# Patient Record
Sex: Female | Born: 1970
Health system: Southern US, Community
[De-identification: ages and names within clinical notes are randomized; demographics above are authoritative.]

## PROBLEM LIST (undated history)

## (undated) DIAGNOSIS — K219 Gastro-esophageal reflux disease without esophagitis: Secondary | ICD-10-CM

## (undated) DIAGNOSIS — Z5189 Encounter for other specified aftercare: Secondary | ICD-10-CM

## (undated) DIAGNOSIS — I219 Acute myocardial infarction, unspecified: Secondary | ICD-10-CM

## (undated) DIAGNOSIS — G459 Transient cerebral ischemic attack, unspecified: Secondary | ICD-10-CM

## (undated) DIAGNOSIS — D6859 Other primary thrombophilia: Secondary | ICD-10-CM

## (undated) DIAGNOSIS — I639 Cerebral infarction, unspecified: Secondary | ICD-10-CM

## (undated) DIAGNOSIS — E785 Hyperlipidemia, unspecified: Secondary | ICD-10-CM

## (undated) DIAGNOSIS — F419 Anxiety disorder, unspecified: Secondary | ICD-10-CM

## (undated) DIAGNOSIS — Z720 Tobacco use: Secondary | ICD-10-CM

## (undated) DIAGNOSIS — IMO0001 Reserved for inherently not codable concepts without codable children: Secondary | ICD-10-CM

## (undated) DIAGNOSIS — Z91148 Patient's other noncompliance with medication regimen for other reason: Secondary | ICD-10-CM

## (undated) DIAGNOSIS — I10 Essential (primary) hypertension: Secondary | ICD-10-CM

## (undated) DIAGNOSIS — Z9114 Patient's other noncompliance with medication regimen: Secondary | ICD-10-CM

## (undated) DIAGNOSIS — I2510 Atherosclerotic heart disease of native coronary artery without angina pectoris: Secondary | ICD-10-CM

## (undated) DIAGNOSIS — K819 Cholecystitis, unspecified: Secondary | ICD-10-CM

## (undated) DIAGNOSIS — D649 Anemia, unspecified: Secondary | ICD-10-CM

## (undated) DIAGNOSIS — I513 Intracardiac thrombosis, not elsewhere classified: Secondary | ICD-10-CM

## (undated) DIAGNOSIS — K859 Acute pancreatitis without necrosis or infection, unspecified: Secondary | ICD-10-CM

## (undated) HISTORY — PX: CARDIAC CATHETERIZATION: SHX172

## (undated) HISTORY — DX: Cerebral infarction, unspecified: I63.9

## (undated) HISTORY — PX: TUBAL LIGATION: SHX77

## (undated) HISTORY — PX: CORONARY ANGIOPLASTY: SHX604

---

## 1998-06-06 ENCOUNTER — Other Ambulatory Visit: Payer: Self-pay

## 1998-06-06 ENCOUNTER — Encounter: Payer: Self-pay | Admitting: Emergency Medicine

## 1998-06-06 ENCOUNTER — Inpatient Hospital Stay (HOSPITAL_COMMUNITY): Admission: EM | Admit: 1998-06-06 | Discharge: 1998-06-13 | Payer: Self-pay | Admitting: Emergency Medicine

## 1998-06-06 ENCOUNTER — Other Ambulatory Visit (HOSPITAL_COMMUNITY): Payer: Self-pay

## 1998-06-09 ENCOUNTER — Encounter: Payer: Self-pay | Admitting: *Deleted

## 1998-06-10 ENCOUNTER — Encounter: Payer: Self-pay | Admitting: *Deleted

## 1998-07-03 ENCOUNTER — Encounter (HOSPITAL_COMMUNITY): Admission: RE | Admit: 1998-07-03 | Discharge: 1998-10-01 | Payer: Self-pay | Admitting: *Deleted

## 1998-07-12 ENCOUNTER — Inpatient Hospital Stay (HOSPITAL_COMMUNITY): Admission: AD | Admit: 1998-07-12 | Discharge: 1998-07-16 | Payer: Self-pay | Admitting: *Deleted

## 1998-07-15 ENCOUNTER — Encounter: Payer: Self-pay | Admitting: *Deleted

## 1998-08-15 ENCOUNTER — Inpatient Hospital Stay (HOSPITAL_COMMUNITY): Admission: EM | Admit: 1998-08-15 | Discharge: 1998-08-17 | Payer: Self-pay | Admitting: Emergency Medicine

## 1998-10-09 ENCOUNTER — Inpatient Hospital Stay (HOSPITAL_COMMUNITY): Admission: EM | Admit: 1998-10-09 | Discharge: 1998-10-12 | Payer: Self-pay | Admitting: Emergency Medicine

## 1998-10-09 ENCOUNTER — Encounter: Payer: Self-pay | Admitting: Emergency Medicine

## 1999-12-09 ENCOUNTER — Ambulatory Visit (HOSPITAL_COMMUNITY): Admission: RE | Admit: 1999-12-09 | Discharge: 1999-12-09 | Payer: Self-pay | Admitting: Family Medicine

## 1999-12-09 ENCOUNTER — Encounter: Payer: Self-pay | Admitting: Family Medicine

## 1999-12-09 ENCOUNTER — Inpatient Hospital Stay (HOSPITAL_COMMUNITY): Admission: AD | Admit: 1999-12-09 | Discharge: 1999-12-09 | Payer: Self-pay | Admitting: Obstetrics and Gynecology

## 1999-12-12 ENCOUNTER — Inpatient Hospital Stay (HOSPITAL_COMMUNITY): Admission: AD | Admit: 1999-12-12 | Discharge: 1999-12-12 | Payer: Self-pay | Admitting: *Deleted

## 1999-12-19 ENCOUNTER — Inpatient Hospital Stay (HOSPITAL_COMMUNITY): Admission: AD | Admit: 1999-12-19 | Discharge: 1999-12-19 | Payer: Self-pay | Admitting: Obstetrics & Gynecology

## 2000-06-04 ENCOUNTER — Inpatient Hospital Stay (HOSPITAL_COMMUNITY): Admission: EM | Admit: 2000-06-04 | Discharge: 2000-06-05 | Payer: Self-pay | Admitting: Emergency Medicine

## 2000-06-04 ENCOUNTER — Encounter: Payer: Self-pay | Admitting: *Deleted

## 2000-06-05 ENCOUNTER — Encounter: Payer: Self-pay | Admitting: Internal Medicine

## 2004-03-11 ENCOUNTER — Emergency Department (HOSPITAL_COMMUNITY): Admission: EM | Admit: 2004-03-11 | Discharge: 2004-03-11 | Payer: Self-pay | Admitting: Emergency Medicine

## 2006-01-03 ENCOUNTER — Inpatient Hospital Stay (HOSPITAL_COMMUNITY): Admission: EM | Admit: 2006-01-03 | Discharge: 2006-01-04 | Payer: Self-pay | Admitting: *Deleted

## 2006-01-05 ENCOUNTER — Ambulatory Visit (HOSPITAL_COMMUNITY): Admission: RE | Admit: 2006-01-05 | Discharge: 2006-01-05 | Payer: Self-pay | Admitting: Neurology

## 2006-01-21 ENCOUNTER — Emergency Department (HOSPITAL_COMMUNITY): Admission: EM | Admit: 2006-01-21 | Discharge: 2006-01-21 | Payer: Self-pay | Admitting: Family Medicine

## 2007-06-25 ENCOUNTER — Ambulatory Visit: Payer: Self-pay | Admitting: Cardiology

## 2007-06-25 ENCOUNTER — Ambulatory Visit: Payer: Self-pay | Admitting: Hematology & Oncology

## 2007-06-28 ENCOUNTER — Inpatient Hospital Stay (HOSPITAL_COMMUNITY): Admission: EM | Admit: 2007-06-28 | Discharge: 2007-06-30 | Payer: Self-pay | Admitting: Emergency Medicine

## 2007-06-28 DIAGNOSIS — I2510 Atherosclerotic heart disease of native coronary artery without angina pectoris: Secondary | ICD-10-CM | POA: Insufficient documentation

## 2007-06-28 DIAGNOSIS — Z9861 Coronary angioplasty status: Secondary | ICD-10-CM

## 2007-07-02 ENCOUNTER — Ambulatory Visit: Payer: Self-pay | Admitting: Internal Medicine

## 2007-07-07 ENCOUNTER — Ambulatory Visit: Payer: Self-pay | Admitting: Cardiology

## 2007-07-12 ENCOUNTER — Ambulatory Visit: Payer: Self-pay | Admitting: Cardiology

## 2007-07-13 ENCOUNTER — Ambulatory Visit: Payer: Self-pay | Admitting: Cardiology

## 2007-07-19 ENCOUNTER — Ambulatory Visit: Payer: Self-pay | Admitting: Cardiovascular Disease

## 2007-07-28 ENCOUNTER — Ambulatory Visit: Payer: Self-pay | Admitting: Cardiology

## 2007-07-28 LAB — CONVERTED CEMR LAB: INR: 10.2 (ref 0.8–1.0)

## 2007-07-29 ENCOUNTER — Ambulatory Visit: Payer: Self-pay | Admitting: Internal Medicine

## 2007-07-29 LAB — CONVERTED CEMR LAB: Prothrombin Time: 51.4 s — ABNORMAL HIGH (ref 10.9–13.3)

## 2007-07-30 ENCOUNTER — Ambulatory Visit: Payer: Self-pay | Admitting: Cardiology

## 2007-07-30 LAB — CONVERTED CEMR LAB
INR: 2.8 — ABNORMAL HIGH (ref 0.8–1.0)
Prothrombin Time: 29.6 s — ABNORMAL HIGH (ref 10.9–13.3)

## 2007-10-13 ENCOUNTER — Ambulatory Visit: Payer: Self-pay | Admitting: Cardiology

## 2007-10-15 ENCOUNTER — Ambulatory Visit: Payer: Self-pay

## 2007-10-15 ENCOUNTER — Encounter: Payer: Self-pay | Admitting: Cardiology

## 2007-10-20 ENCOUNTER — Ambulatory Visit: Payer: Self-pay | Admitting: Cardiology

## 2007-11-04 ENCOUNTER — Ambulatory Visit: Payer: Self-pay | Admitting: Internal Medicine

## 2007-11-18 ENCOUNTER — Emergency Department (HOSPITAL_COMMUNITY): Admission: EM | Admit: 2007-11-18 | Discharge: 2007-11-18 | Payer: Self-pay | Admitting: Emergency Medicine

## 2007-11-18 ENCOUNTER — Ambulatory Visit: Payer: Self-pay | Admitting: Internal Medicine

## 2007-12-02 ENCOUNTER — Ambulatory Visit: Payer: Self-pay | Admitting: Cardiology

## 2007-12-13 ENCOUNTER — Inpatient Hospital Stay (HOSPITAL_COMMUNITY): Admission: EM | Admit: 2007-12-13 | Discharge: 2007-12-21 | Payer: Self-pay | Admitting: Emergency Medicine

## 2007-12-13 ENCOUNTER — Ambulatory Visit: Payer: Self-pay | Admitting: *Deleted

## 2007-12-15 ENCOUNTER — Ambulatory Visit: Payer: Self-pay | Admitting: Hematology & Oncology

## 2007-12-15 ENCOUNTER — Ambulatory Visit: Payer: Self-pay | Admitting: Gastroenterology

## 2007-12-16 ENCOUNTER — Ambulatory Visit: Payer: Self-pay | Admitting: Vascular Surgery

## 2007-12-16 ENCOUNTER — Encounter: Payer: Self-pay | Admitting: Gastroenterology

## 2007-12-16 ENCOUNTER — Encounter: Payer: Self-pay | Admitting: Hematology & Oncology

## 2007-12-21 ENCOUNTER — Encounter: Payer: Self-pay | Admitting: Gastroenterology

## 2007-12-28 ENCOUNTER — Emergency Department (HOSPITAL_COMMUNITY): Admission: EM | Admit: 2007-12-28 | Discharge: 2007-12-29 | Payer: Self-pay | Admitting: Emergency Medicine

## 2008-01-07 ENCOUNTER — Ambulatory Visit: Payer: Self-pay | Admitting: Cardiology

## 2008-03-13 DIAGNOSIS — I2109 ST elevation (STEMI) myocardial infarction involving other coronary artery of anterior wall: Secondary | ICD-10-CM

## 2008-03-13 DIAGNOSIS — G459 Transient cerebral ischemic attack, unspecified: Secondary | ICD-10-CM | POA: Insufficient documentation

## 2008-03-17 DIAGNOSIS — G459 Transient cerebral ischemic attack, unspecified: Secondary | ICD-10-CM

## 2008-03-17 HISTORY — DX: Transient cerebral ischemic attack, unspecified: G45.9

## 2008-05-08 ENCOUNTER — Ambulatory Visit: Payer: Self-pay

## 2008-05-08 ENCOUNTER — Ambulatory Visit: Payer: Self-pay | Admitting: Cardiology

## 2008-05-17 ENCOUNTER — Ambulatory Visit: Payer: Self-pay

## 2008-05-17 ENCOUNTER — Encounter: Payer: Self-pay | Admitting: Cardiology

## 2008-09-28 ENCOUNTER — Emergency Department (HOSPITAL_COMMUNITY): Admission: EM | Admit: 2008-09-28 | Discharge: 2008-09-28 | Payer: Self-pay | Admitting: Family Medicine

## 2008-10-06 ENCOUNTER — Encounter (INDEPENDENT_AMBULATORY_CARE_PROVIDER_SITE_OTHER): Payer: Self-pay | Admitting: *Deleted

## 2008-10-27 ENCOUNTER — Emergency Department (HOSPITAL_COMMUNITY): Admission: EM | Admit: 2008-10-27 | Discharge: 2008-10-27 | Payer: Self-pay | Admitting: Emergency Medicine

## 2008-11-15 ENCOUNTER — Ambulatory Visit: Payer: Self-pay | Admitting: Cardiology

## 2009-01-10 ENCOUNTER — Encounter (INDEPENDENT_AMBULATORY_CARE_PROVIDER_SITE_OTHER): Payer: Self-pay | Admitting: *Deleted

## 2009-01-23 ENCOUNTER — Encounter (INDEPENDENT_AMBULATORY_CARE_PROVIDER_SITE_OTHER): Payer: Self-pay | Admitting: *Deleted

## 2009-05-02 ENCOUNTER — Ambulatory Visit: Payer: Self-pay | Admitting: Internal Medicine

## 2009-05-02 ENCOUNTER — Inpatient Hospital Stay (HOSPITAL_COMMUNITY): Admission: EM | Admit: 2009-05-02 | Discharge: 2009-05-03 | Payer: Self-pay | Admitting: Emergency Medicine

## 2009-05-04 ENCOUNTER — Encounter: Payer: Self-pay | Admitting: Internal Medicine

## 2009-05-17 DIAGNOSIS — I10 Essential (primary) hypertension: Secondary | ICD-10-CM | POA: Insufficient documentation

## 2009-05-29 ENCOUNTER — Emergency Department (HOSPITAL_COMMUNITY): Admission: EM | Admit: 2009-05-29 | Discharge: 2009-05-29 | Payer: Self-pay | Admitting: Emergency Medicine

## 2009-05-29 ENCOUNTER — Emergency Department (HOSPITAL_COMMUNITY): Admission: EM | Admit: 2009-05-29 | Discharge: 2009-05-29 | Payer: Self-pay | Admitting: Family Medicine

## 2009-05-30 ENCOUNTER — Encounter (INDEPENDENT_AMBULATORY_CARE_PROVIDER_SITE_OTHER): Payer: Self-pay | Admitting: *Deleted

## 2009-06-07 ENCOUNTER — Ambulatory Visit: Payer: Self-pay | Admitting: Cardiology

## 2009-06-07 DIAGNOSIS — E785 Hyperlipidemia, unspecified: Secondary | ICD-10-CM

## 2009-06-13 ENCOUNTER — Encounter: Payer: Self-pay | Admitting: Cardiology

## 2009-06-19 ENCOUNTER — Ambulatory Visit: Payer: Self-pay | Admitting: Internal Medicine

## 2009-06-25 ENCOUNTER — Telehealth (INDEPENDENT_AMBULATORY_CARE_PROVIDER_SITE_OTHER): Payer: Self-pay | Admitting: *Deleted

## 2009-07-13 ENCOUNTER — Ambulatory Visit: Payer: Self-pay | Admitting: Diagnostic Radiology

## 2009-07-13 ENCOUNTER — Emergency Department (HOSPITAL_BASED_OUTPATIENT_CLINIC_OR_DEPARTMENT_OTHER): Admission: EM | Admit: 2009-07-13 | Discharge: 2009-07-14 | Payer: Self-pay | Admitting: Emergency Medicine

## 2009-07-14 ENCOUNTER — Ambulatory Visit: Payer: Self-pay | Admitting: Diagnostic Radiology

## 2009-07-19 ENCOUNTER — Emergency Department (HOSPITAL_COMMUNITY): Admission: EM | Admit: 2009-07-19 | Discharge: 2009-07-19 | Payer: Self-pay | Admitting: Emergency Medicine

## 2009-07-25 ENCOUNTER — Telehealth (INDEPENDENT_AMBULATORY_CARE_PROVIDER_SITE_OTHER): Payer: Self-pay | Admitting: *Deleted

## 2009-08-14 ENCOUNTER — Emergency Department (HOSPITAL_COMMUNITY): Admission: EM | Admit: 2009-08-14 | Discharge: 2009-08-14 | Payer: Self-pay | Admitting: Family Medicine

## 2009-08-14 ENCOUNTER — Emergency Department (HOSPITAL_COMMUNITY): Admission: EM | Admit: 2009-08-14 | Discharge: 2009-08-15 | Payer: Self-pay | Admitting: Emergency Medicine

## 2009-10-08 ENCOUNTER — Encounter: Payer: Self-pay | Admitting: Internal Medicine

## 2009-10-08 ENCOUNTER — Observation Stay (HOSPITAL_COMMUNITY): Admission: EM | Admit: 2009-10-08 | Discharge: 2009-10-09 | Payer: Self-pay | Admitting: Internal Medicine

## 2009-10-08 ENCOUNTER — Ambulatory Visit: Payer: Self-pay | Admitting: Radiology

## 2009-10-08 ENCOUNTER — Ambulatory Visit: Payer: Self-pay | Admitting: Internal Medicine

## 2009-10-09 ENCOUNTER — Encounter: Payer: Self-pay | Admitting: Cardiovascular Disease

## 2009-10-09 ENCOUNTER — Ambulatory Visit: Payer: Self-pay

## 2009-10-09 ENCOUNTER — Encounter (HOSPITAL_COMMUNITY): Admission: RE | Admit: 2009-10-09 | Discharge: 2009-12-03 | Payer: Self-pay | Admitting: Cardiology

## 2009-10-09 ENCOUNTER — Telehealth: Payer: Self-pay | Admitting: Cardiology

## 2009-10-09 ENCOUNTER — Ambulatory Visit: Payer: Self-pay | Admitting: Cardiovascular Disease

## 2009-10-10 ENCOUNTER — Encounter: Payer: Self-pay | Admitting: Cardiology

## 2009-10-12 ENCOUNTER — Encounter (INDEPENDENT_AMBULATORY_CARE_PROVIDER_SITE_OTHER): Payer: Self-pay | Admitting: *Deleted

## 2009-10-15 ENCOUNTER — Ambulatory Visit: Payer: Self-pay | Admitting: Psychiatry

## 2009-10-18 ENCOUNTER — Telehealth: Payer: Self-pay | Admitting: Cardiology

## 2009-10-19 ENCOUNTER — Encounter: Payer: Self-pay | Admitting: Cardiology

## 2009-11-13 ENCOUNTER — Ambulatory Visit: Payer: Self-pay | Admitting: Family Medicine

## 2009-11-13 ENCOUNTER — Encounter (INDEPENDENT_AMBULATORY_CARE_PROVIDER_SITE_OTHER): Payer: Self-pay | Admitting: *Deleted

## 2010-01-03 ENCOUNTER — Emergency Department (HOSPITAL_BASED_OUTPATIENT_CLINIC_OR_DEPARTMENT_OTHER): Admission: EM | Admit: 2010-01-03 | Discharge: 2010-01-03 | Payer: Self-pay | Admitting: Emergency Medicine

## 2010-01-11 ENCOUNTER — Emergency Department (HOSPITAL_BASED_OUTPATIENT_CLINIC_OR_DEPARTMENT_OTHER): Admission: EM | Admit: 2010-01-11 | Discharge: 2010-01-11 | Payer: Self-pay | Admitting: Emergency Medicine

## 2010-01-22 ENCOUNTER — Encounter (INDEPENDENT_AMBULATORY_CARE_PROVIDER_SITE_OTHER): Payer: Self-pay | Admitting: Internal Medicine

## 2010-01-22 ENCOUNTER — Ambulatory Visit: Payer: Self-pay | Admitting: Internal Medicine

## 2010-01-23 ENCOUNTER — Encounter (INDEPENDENT_AMBULATORY_CARE_PROVIDER_SITE_OTHER): Payer: Self-pay | Admitting: Internal Medicine

## 2010-01-24 ENCOUNTER — Encounter (INDEPENDENT_AMBULATORY_CARE_PROVIDER_SITE_OTHER): Payer: Self-pay | Admitting: Internal Medicine

## 2010-01-24 ENCOUNTER — Ambulatory Visit: Payer: Self-pay | Admitting: Surgery

## 2010-02-03 ENCOUNTER — Emergency Department (HOSPITAL_COMMUNITY): Admission: EM | Admit: 2010-02-03 | Discharge: 2010-02-03 | Payer: Self-pay | Admitting: Emergency Medicine

## 2010-02-21 ENCOUNTER — Telehealth: Payer: Self-pay | Admitting: Cardiology

## 2010-02-21 ENCOUNTER — Inpatient Hospital Stay (HOSPITAL_COMMUNITY): Admission: EM | Admit: 2010-02-21 | Discharge: 2010-01-24 | Payer: Self-pay | Admitting: Emergency Medicine

## 2010-02-22 ENCOUNTER — Encounter: Payer: Self-pay | Admitting: Cardiology

## 2010-02-25 ENCOUNTER — Telehealth: Payer: Self-pay | Admitting: Cardiology

## 2010-03-06 ENCOUNTER — Inpatient Hospital Stay (HOSPITAL_COMMUNITY)
Admission: EM | Admit: 2010-03-06 | Discharge: 2010-03-07 | Payer: Self-pay | Source: Home / Self Care | Attending: Cardiology | Admitting: Cardiology

## 2010-03-18 ENCOUNTER — Emergency Department (HOSPITAL_COMMUNITY)
Admission: EM | Admit: 2010-03-18 | Discharge: 2010-03-19 | Payer: Self-pay | Source: Home / Self Care | Admitting: Emergency Medicine

## 2010-03-19 ENCOUNTER — Telehealth: Payer: Self-pay | Admitting: Cardiology

## 2010-04-04 ENCOUNTER — Emergency Department (HOSPITAL_COMMUNITY)
Admission: EM | Admit: 2010-04-04 | Discharge: 2010-04-04 | Payer: Self-pay | Source: Home / Self Care | Admitting: Family Medicine

## 2010-04-05 NOTE — Discharge Summary (Signed)
NAMEABY, GESSEL NO.:  [id number]  MEDICAL RECORD NO.:  [id number]          PATIENT TYPE:  INP  LOCATION:  2022                         FACILITY:  [facility]  PHYSICIAN:  Jesse Sans. Wall, MD, FACCDATE OF BIRTH:  June 30, 1970  DATE OF ADMISSION:  03/06/2010 DATE OF DISCHARGE:  03/07/2010                              DISCHARGE SUMMARY   PRIMARY CARDIOLOGIST:  Maisie Fus C. Wall, MD, [facility]  DISCHARGE DIAGNOSES: 1. Noncardiac chest pain. 2. Hypertension. 3. History of noncompliance.  SECONDARY DIAGNOSES: 1. Coronary artery disease status post anterior ST elevation     myocardial infarction in 2000 with percutaneous coronary     intervention of the left anterior descending artery.     a.     Status post PTCA and bare-metal stent to the left anterior      descending artery in 2009.     b.     Status post catheterization on May 03, 2009, showing no      obstructive disease.  An ejection fraction of 55%. 2. Obesity. 3. Dyslipidemia. 4. Left posterior cerebral artery territory infarction in November     2011.  DIAGNOSTICS/PROCEDURES PERFORMED DURING HOSPITALIZATION:  Chest x-ray showing no active cardiopulmonary disease.  HISTORY OF PRESENT ILLNESS:  This is a 40 year old female with the above- stated problem list who presented to [facility] Emergency Department with complaints of nausea and vomiting throughout the day.  This was followed by substernal chest pain with a squeezing sensation radiating to her left side.  This was relieved by nitroglycerin.  In the emergency department, she is still continue to have mild low-grade pain.  Point-of- care markers in the emergency department were negative x1.  Her EKG was normal sinus rhythm with an old septal infarct without acute changes. With the patient's extensive cardiac history, the patient was admitted to rule out myocardial infarction.  HOSPITAL COURSE:  The patient ruled out for myocardial infarction with cardiac  enzymes being negative x2.  Point-of-care markers being negative x1.  She still felt she had an awareness as something in her chest. This has been constant for greater than 24 hours now.  Her nausea and vomiting had relieved, and the patient was able to eat breakfast.  Of note, the patient was placed on a proton pump inhibitor that will be continued on discharge for at least 30 days until reevaluation as an outpatient.  The patient was also hypertensive upon admission.  Per discharge instructions in November, her Coreg was increased at 12.5 mg twice daily, but the patient has only been taking 6.25 mg twice daily. This has been increased during admission, and her hypertension is better with the increased Coreg.  This change will be continued.  The patient has received a prescription for this.  On day of discharge, Dr. Daleen Squibb evaluated the patient and noted her stable for home.  The patient has a history of noncompliance of medications as well as a followup appointment.  Dr. Daleen Squibb encouraged the patient to keep her appointment.  DISCHARGE LABORATORY DATA:  Sodium 140, potassium 3.9, chloride 104, bicarb 25, BUN 5, creatinine 0.73, total bilirubin 0.7, alkaline  phosphatase 65, AST 44, ALT 33, and total protein 7.1, albumin 3.8, calcium 9, amylase 72, lipase 33, cardiac enzymes negative x2.  DISCHARGE MEDICATIONS: 1. Protonix 40 mg 1 tablet daily. 2. Carvedilol 12.5 mg 1 tablet twice daily. 3. Acetaminophen 500 mg 1-2 tablets every 6 hours needed for pain. 4. Aspirin 325 mg daily. 5. Clopidogrel 75 mg daily. 6. Nitroglycerin sublingual 0.4 mg 1 tablet every 5 minutes as needed     for chest pain up to three doses. 7. Simvastatin 20 mg 1 tablet daily.  DISCHARGE INSTRUCTIONS: 1. The patient is to follow up with Dr. Daleen Squibb on April 08, 2008, at     10 a.m. 2. The patient is to increase activity as tolerated. 3. The patient is to continue low-sodium heart-healthy diet. 4. The patient to  stop any activity that causes chest pain. 5. The patient can call our office in the interim if there are any     concerns.  DURATION OF DISCHARGE:  Greater than 30 minutes with physician and physician extender time.     Leonette Monarch, PA-C   ______________________________ Jesse Sans Daleen Squibb, MD, [facility]    NB/MEDQ  D:  03/07/2010  T:  03/08/2010  Job:  [id number]  Electronically Signed by Alen Blew P.A. on 03/26/2010 08:03:49 PM Electronically Signed by Valera Castle MD [facility] on 04/05/2010 04:13:47 PM

## 2010-04-08 ENCOUNTER — Ambulatory Visit: Admit: 2010-04-08 | Payer: Self-pay | Admitting: Cardiology

## 2010-04-08 LAB — URINE CULTURE
Colony Count: NO GROWTH
Culture: NO GROWTH

## 2010-04-08 LAB — URINALYSIS, MICROSCOPIC ONLY
Specific Gravity, Urine: 1.024 (ref 1.005–1.030)
Urine Glucose, Fasting: NEGATIVE mg/dL

## 2010-04-08 LAB — POCT URINALYSIS DIPSTICK
Nitrite: POSITIVE — AB
Specific Gravity, Urine: 1.025 (ref 1.005–1.030)
Urine Glucose, Fasting: NEGATIVE mg/dL
pH: 6.5 (ref 5.0–8.0)

## 2010-04-08 LAB — WET PREP, GENITAL: Trich, Wet Prep: NONE SEEN

## 2010-04-08 LAB — POCT PREGNANCY, URINE: Preg Test, Ur: NEGATIVE

## 2010-04-08 LAB — GC/CHLAMYDIA PROBE AMP, GENITAL: Chlamydia, DNA Probe: NEGATIVE

## 2010-04-16 NOTE — Progress Notes (Signed)
Summary: Psych Eval   Phone Note From Other Clinic   Caller: Victorino Dike --Behavioral Medicine Summary of Call: Victorino Dike called to make [place] aware that she scheduled the pt for a Psychiatric Eval on 10/15/09 with Dr Noe Gens. Dx depression, anxiety and panic attacks. Victorino Dike made the pt aware of appt.  (Pt# [id number]) Initial call taken by: Julieta Gutting, RN, BSN,  October 09, 2009 3:31 PM     Appended Document: Lindsey [name] When the pt was discharged from the hospital she was given a note to return to work on 10/15/09 with no restrictions.  Due to Psychiatric appt on 10/15/09 I changed this note to RTW on 10/16/09.

## 2010-04-16 NOTE — Letter (Signed)
Summary: Appointment - Missed  [facility]     [place], [place]    Phone:   Fax:      May 30, 2009 MRN: [id number]   [facility] [address] [place], [place]  [address]   Dear Lindsey [name],  Our records indicate you missed your appointment on   05-22-2009  with  Dr.Wall It is very important that we reach you to reschedule this appointment. We look forward to participating in your health care needs. Please contact us at the number listed above at your earliest convenience to reschedule this appointment.     Sincerely,       Lorne Skeens  [facility] Scheduling Team

## 2010-04-16 NOTE — Progress Notes (Signed)
Summary: requesting rtn to work note asap/rtn call to [employer] Note Call from Patient   Caller: Patient Reason for Call: Talk to Nurse Summary of Call: pt needs rtn to work note-in hospital 7-25-7-26, husband requesting note for 7-25 thru 8-2, fax to [place] attn Mykah Straker [phone] if can't fax today-husband requesting a call to Barbara Cower [phone] her supervisor letting him know when it will be faxed, her work giving her a hard time about being out pt [phone] Initial call taken by: Glynda Jaeger,  October 18, 2009 3:25 PM  Follow-up for Phone Call         Barbara Cower pt's supervisor was called  per pt's request to let him know that we can't fax a return to work note for pt. today. Md needs to okay before  note can be send. I let Barbara Cower know will send this  message to Dr. Daleen Squibb and his nurse regarding pt. needing a return to work note. Pt off work from 10/08/09 thru 10/16/09. Supervisor Barbara Cower) verbalized understanding. Note needs to be fax to [phone].  Follow-up by: Ollen Gross, RN, BSN,  October 18, 2009 4:36 PM  Additional Follow-up for Phone Call Additional follow up Details #1::        pt really needs someone to call and fax note to patient's work. Office # [phone] fax# [phone] to Attn of Barbara Cower you can reach patient at [phone]    Additional Follow-up for Phone Call Additional follow up Details #2::    I have faxed letter to [facility] reflecting the dates indicated. Lisabeth Devoid RN   Appended Document: requesting rtn to work note asap/rtn call to [employer]  Reviewed Juanito Doom, MD

## 2010-04-16 NOTE — Assessment & Plan Note (Signed)
Summary: EPH/RESCH/JSS  Medications Added NITROSTAT 0.4 MG SUBL (NITROGLYCERIN) 1 tablet under tongue at onset of chest pain; you may repeat every 5 minutes for up to 3 doses.      Allergies Added: NKDA  Visit Type:  EPH Primary Provider:  No PCP at this time  CC:  pt states she is really stressed and says she thinks she is having panic attacks again.Marland Kitchensob w/walking..some chest pressure...night sweats w/gasping for breath.Marland KitchenMarland KitchenMarland KitchenMarland Kitchenpt has lost 25lbs by eating right...pt also states she has had left calf pain w/edema.  History of Present Illness: Mrs Scobee returns today after being admitted with unstable chest pain.  Cardiac catheterization showed a 40% in-stent restenosis in her proximal LAD bare-metal stent. Left ventricular function was essentially normal with EF of 55%. Medical therapy was continued.  Since discharge, she's had no further chest discomfort. She is overdue blood work specifically lipids. She seems to be compliant with her medications. She has lost about 30 pounds.  Current Medications (verified): 1)  Plavix 75 Mg Tabs (Clopidogrel Bisulfate) .... Take One Daily 2)  Carvedilol 6.25 Mg Tabs (Carvedilol) .... Take One Tablet By Mouth Twice A Day 3)  Simvastatin 20 Mg Tabs (Simvastatin) .... Take One in The Evening 4)  Aspir-Low 81 Mg Tbec (Aspirin) .... Take One Daily 5)  Slow Fe 160 (50 Fe) Mg Cr-Tabs (Ferrous Sulfate Dried) .... Take One Twice Daily 6)  Nitrostat 0.4 Mg Subl (Nitroglycerin) .Marland Kitchen.. 1 Tablet Under Tongue At Onset of Chest Pain; You May Repeat Every 5 Minutes For Up To 3 Doses.  Allergies (verified): No Known Drug Allergies  Past History:  Past Medical History: Last updated: 05/17/2009 MYOCARDIAL INFARCTION, ANTEROLATERAL Jaeda Bruso, INITIAL EPISODE (ICD-410.01) CAD, NATIVE VESSEL (ICD-414.01) TIA (ICD-435.9) HYPERTENSION (ICD-401.9) OBESITY-MORBID (>100') (ICD-278.01) ANTITHROMBIN III DEFICIENCY (ICD-286.9)    Family History: Last updated:  03/13/2008 Family History of CVA or Stroke:  Family History of Hypertension:   Social History: Last updated: 11/15/2008 Full Time Married currently going through a divorce 11/2008 Tobacco Use - No.  Alcohol Use - no  Review of Systems       negative other than history of present illness  Vital Signs:  Patient profile:   40 year old female Height:      66 inches Weight:      175 pounds BMI:     28.35 Pulse rate:   87 / minute Pulse rhythm:   irregular BP sitting:   132 / 90  (left arm) Cuff size:   large  Vitals Entered By: Danielle Rankin, CMA (June 07, 2009 4:08 PM)  Physical Exam  General:  overweight but looks markedly healthier with her weight loss Head:  normocephalic and atraumatic Eyes:  PERRLA/EOM intact; conjunctiva and lids normal. Neck:  Neck supple, no JVD. No masses, thyromegaly or abnormal cervical nodes. Chest Renel Ende:  no deformities or breast masses noted Lungs:  Clear bilaterally to auscultation and percussion. Heart:  Non-displaced PMI, chest non-tender; regular rate and rhythm, S1, S2 without murmurs, rubs or gallops. Carotid upstroke normal, no bruit. Normal abdominal aortic size, no bruits. Femorals normal pulses, no bruits. Pedals normal pulses. No edema, no varicosities. Msk:  Back normal, normal gait. Muscle strength and tone normal. Pulses:  pulses normal in all 4 extremities radial stick from the catheter stable Extremities:  No clubbing or cyanosis. Neurologic:  Alert and oriented x 3. Skin:  Intact without lesions or rashes. Psych:  Normal affect.   Problems:  Medical Problems Added: 1)  Dx of Encounter  For Long-term Use of Other Medications  (ICD-V58.69) 2)  Dx of Hyperlipidemia-mixed  (ICD-272.4)  EKG  Procedure date:  06/07/2009  Findings:      normal sinus rhythm, previous anterior septal Naarah Borgerding infarct, no  Impression & Recommendations:  Problem # 1:  CAD, NATIVE VESSEL (ICD-414.01) Assessment Unchanged  Her updated medication  list for this problem includes:    Plavix 75 Mg Tabs (Clopidogrel bisulfate) .Marland Kitchen... Take one daily    Carvedilol 6.25 Mg Tabs (Carvedilol) .Marland Kitchen... Take one tablet by mouth twice a day    Aspir-low 81 Mg Tbec (Aspirin) .Marland Kitchen... Take one daily    Nitrostat 0.4 Mg Subl (Nitroglycerin) .Marland Kitchen... 1 tablet under tongue at onset of chest pain; you may repeat every 5 minutes for up to 3 doses.  Orders: EKG w/ Interpretation ([address])  Problem # 2:  TIA (ICD-435.9) Assessment: Improved  Problem # 3:  OBESITY-MORBID (>100') (ICD-278.01) Assessment: Improved  Problem # 4:  HYPERTENSION (ICD-401.9) Assessment: Improved  Her updated medication list for this problem includes:    Carvedilol 6.25 Mg Tabs (Carvedilol) .Marland Kitchen... Take one tablet by mouth twice a day    Aspir-low 81 Mg Tbec (Aspirin) .Marland Kitchen... Take one daily  Orders: EKG w/ Interpretation ([address])  Problem # 5:  HYPERLIPIDEMIA-MIXED (ICD-272.4)  She needs fasting lipids and a comprehensive metabolic panel in 4 weeks. Her updated medication list for this problem includes:    Simvastatin 20 Mg Tabs (Simvastatin) .Marland Kitchen... Take one in the evening  Patient Instructions: 1)  Your physician recommends that you schedule a follow-up appointment in: YEAR WITH DR Teaira Croft 2)  Your physician recommends that you return for lab work in: BMET LIPID LIVER 272.4 V58.69 3)  Your physician recommends that you continue on your current medications as directed. Please refer to the Current Medication list given to you today. Prescriptions: NITROSTAT 0.4 MG SUBL (NITROGLYCERIN) 1 tablet under tongue at onset of chest pain; you may repeat every 5 minutes for up to 3 doses.  #25 x 9   Entered by:   Danielle Rankin, CMA   Authorized by:   Gaylord Shih, MD, [facility]   Signed by:   Danielle Rankin, CMA on 06/07/2009   Method used:   Electronically to        [facility] (retail)       [address]       [place], [place]  [address]       Ph: [phone]        Fax: [phone]   RxID:   [id number]

## 2010-04-16 NOTE — Letter (Signed)
Summary: [employer] Authorization   Imported By: Roderic Ovens 06/14/2009 12:42:47  _____________________________________________________________________  External Attachment:    Type:   Image     Comment:   External Document

## 2010-04-16 NOTE — Letter (Signed)
Summary: Appointment - Missed  [facility], Main Office  [address]   [place], [place] [address]   Phone: [phone]  Fax: [phone]     October 12, 2009 MRN: [id number]   [facility] [address] [place], [place]  [address]   Dear Ms. Dulak,  Our records indicate you missed your appointment on  10/11/2009 with  Dr.  Daleen Squibb   It is very important that we reach you to reschedule this appointment. We look forward to participating in your health care needs. Please contact us at the number listed above at your earliest convenience to reschedule this appointment.     Sincerely,    Lorne Skeens  [facility] Scheduling Team

## 2010-04-16 NOTE — Progress Notes (Signed)
   Faxed [facility] over to [facility] to fax [phone], Pager [phone] [facility]  June 25, 2009 9:01 AM

## 2010-04-16 NOTE — Letter (Signed)
Summary: [place] No Show Letter  [facility]  [address] [place], [place] [address]   Phone: [phone]  Fax: [phone]    11/13/2009 MRN: [id number]  [facility] [address] [place], [place]  [address]   Dear Ms. Bays,   Our records indicate that you missed your scheduled appointment with _____Dr.Tabori____________ on ___8/30/11_________.  Please contact this office to reschedule your appointment as soon as possible.  It is important that you keep your scheduled appointments with your physician, so we can provide you the best care possible.  Please be advised that there may be a charge for "no show" appointments.    Sincerely,    at [employer]  Appended Document: stoping plavix for dental work LMTCB on cell. Mylo Red RN  Appended Document: stoping plavix for dental work Pt is aware ok to stop Plavix for dental work.  Dr. Lawrence Marseilles DDS in [place], [place]  Fax [phone].  Phone #  [phone].  Mylo Red RN

## 2010-04-16 NOTE — Progress Notes (Signed)
   Request recieved from [employer] sent to [facility]  Jul 25, 2009 2:50 PM    Appended Document:  2nd Request recieved from [facility] sent to [facility]

## 2010-04-16 NOTE — Miscellaneous (Signed)
  Clinical Lists Changes  Observations: Added new observation of NUCLEAR NOS: Findings  Low risk nuclear study  Evidence for anterior (septal apical) infarct  Evidence for LV Dysfunction LV Dysfunction    Overall Impression   Exercise Capacity: Fair exercise capacity. BP Response: Normal blood pressure response. Clinical Symptoms: Dyspnea with SSCP in recovery ECG Impression: Anterior [name] infarct No ischemia with exercise Overall Impression: Large apical [name] infarct withdecreased LV function  No ischemia    (10/09/2009 10:56)      Nuclear Study  Procedure date:  10/09/2009  Findings:      Findings  Low risk nuclear study  Evidence for anterior (septal apical) infarct  Evidence for LV Dysfunction LV Dysfunction    Overall Impression   Exercise Capacity: Fair exercise capacity. BP Response: Normal blood pressure response. Clinical Symptoms: Dyspnea with SSCP in recovery ECG Impression: Anterior [name] infarct No ischemia with exercise Overall Impression: Large apical [name] infarct withdecreased LV function  No ischemia

## 2010-04-16 NOTE — Letter (Signed)
Summary: Return To Work  [employer], Main Office  [address]   [place], [place] [address]   Phone: [phone]  Fax: [phone]    10/19/2009  TO: WHOM IT MAY CONCERN   RE: Lindsey [name] [address] [id number]   The above named individual is under my medical care and may return to work on August 2,2011 with no restrictions.  This patient was hospitalized from July 25-26, 2011 and was instructed to remain out of work until October 16, 2009.   If you have any further questions or need additional information, please call.     Sincerely,     Gaylord Shih, MD, [facility] Julieta Gutting, RN, BSN

## 2010-04-16 NOTE — Assessment & Plan Note (Signed)
Summary: Cardiology Nuclear Testing  Nuclear Med Background Indications for Stress Test: Evaluation for Ischemia, Post Hospital  Indications Comments: 10/08/09 [facility]: CP/SOB  History: Angioplasty, Echo, Heart Catheterization, Myocardial Infarction, Myocardial Perfusion Study, Stents  History Comments: '00 AWMI: V. 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 24 hours, per Patient.  This patient walked on the treadmill and became sob. Her pictures were checked and found to have no change. She developed Chest pressure in recovery and with time it radiated to her back. DOD Dr. Ephraim Hamburger was consulted about her new symptoms. He advised for her to follow up with T.Wall. She also took a Nitro sublingual of her own before she left. She stated that it helped.  The patient was also advised that if her chest pressure remained and she had to continue to take Nitro to call 911 if she took 3 without any relief.  Nuclear Med Study 1 or 2 day study:  1 day     Stress Test Type:  Stress Reading MD:  Charlton Haws, MD     Referring MD:  T.Wall Resting Radionuclide:  Technetium 105m Tetrofosmin     Resting Radionuclide Dose:  11 mCi  Stress Radionuclide:  Technetium 58m Tetrofosmin     Stress Radionuclide Dose:  33 mCi   Stress Protocol Exercise Time (min):  8:30 min     Max HR:  162 bpm     Predicted Max HR:  179 bpm  Max Systolic BP: 188 mm Hg      Percent Max HR:  90.50 %     METS: 10.4 Rate Pressure Product:  [address]    Stress Test Technologist:  Milana Na EMT-P     Nuclear Technologist:  Domenic Polite CNMT  Rest Procedure  Myocardial perfusion imaging was performed at rest 45 minutes following the intravenous administration of Myoview Technetium 41m Tetrofosmin.  Stress Procedure  The patient exercised for 8:30. The patient stopped due to fatigue, sob, and chest pressure.  There were non specific ST-T wave changes and rare pvcs.  Myoview was injected at peak exercise and myocardial perfusion imaging was performed after a brief delay.  QPS Raw Data Images:  Normal; no motion artifact; normal heart/lung ratio. Stress Images:  Decrease apical counts Rest Images:  Decrase apical counts Subtraction (SDS):  1 Transient Ischemic Dilatation:  1.06  (Normal <1.22)  Lung/Heart Ratio:  .37  (Normal <0.45)  Quantitative Gated Spect Images QGS EDV:  97 ml QGS ESV:  51 ml QGS EF:  48 % QGS cine images:  Apical dyskinesis  Findings Low risk nuclear study  Evidence for anterior (septal apical) infarct  Evidence for LV Dysfunction LV Dysfunction    Overall Impression  Exercise Capacity: Fair exercise capacity. BP Response: Normal blood pressure response. Clinical Symptoms: Dyspnea with SSCP in recovery ECG Impression: Anterior wall infarct No ischemia with exercise Overall Impression: Large apical wall infarct withdecreased LV  function  No ischemia  Appended Document: Cardiology Nuclear Testing stable. no change in treatment.  Appended Document: Cardiology Nuclear Testing PT HAS APPT AT 2:00 PM TODAY./[place]  Appended Document: Cardiology Nuclear Testing NEW WORK NUMBER [phone]./[place]

## 2010-04-18 NOTE — Progress Notes (Signed)
Summary: rx refill   Phone Note Refill Request Message from:  Patient on March 19, 2010 10:28 AM  Refills Requested: Medication #1:  PLAVIX 75 MG TABS take one daily  Method Requested: Telephone to Pharmacy Initial call taken by: Roe Coombs,  March 19, 2010 10:28 AM    Prescriptions: PLAVIX 75 MG TABS (CLOPIDOGREL BISULFATE) take one daily  #30 x 6   Entered by:   Danielle Rankin, CMA   Authorized by:   Gaylord Shih, MD, [facility]   Signed by:   Danielle Rankin, CMA on 03/19/2010   Method used:   Electronically to        [facility] (retail)       [address]       [place], [place]  [address]       Ph: [phone]       Fax: [phone]   RxID:   [id number]

## 2010-04-18 NOTE — Progress Notes (Signed)
Summary: talk to nurse   Phone Note Call from Patient Call back at [phone]   Caller: Patient Summary of Call: pt states that she is having a squeezing sensatation in her chest. Pt is out of town in [facility] and will be driving back tonight. [phone] Initial call taken by: Edman Circle,  February 25, 2010 10:54 AM  Follow-up for Phone Call        [facility]. Mylo Red RN  Additional Follow-up for Phone Call Additional follow up Details #1::        We cannot advise her over the phone. Needs to get checked out down there before driving back. Additional Follow-up by: Gaylord Shih, MD, [facility],  February 25, 2010 12:31 PM     Appended Document: talk to nurse Ms. Brandy will have this "squeezing sensation" in her chest checked out prior to driving home from [facility] and she will restart her Plavix. Mylo Red RN  Appended Document: talk to nurse good.

## 2010-04-18 NOTE — Letter (Signed)
Summary: Clearance Letter  [employer], Main Office  [address]   [place], [place] [address]   Phone: [phone]  Fax: [phone]    February 22, 2010  Re:     [facility] Address:   [address]     [place], [place]  [address] DOB:     05/13/1970 MRN:     [id number]   Dear Dr. Lawrence Marseilles DDS  Ms. Lantis is cleared from a cardiac standpoint to stop Plavix for dental surgery.  She will need to restart Plavix as soon as possible afterwards. If you have any further questions please do not hesitate to call our office at [phone].      Sincerely,  Dr. Valera Castle Lisabeth Devoid RN  Appended Document: Clearance Letter do not stop Plavix until chest discomfort evaluated.  Reviewed Juanito Doom, MD  Appended Document: Clearance Letter I just talked with Ms. Henault.  She has had the "squeezing sensation"  in her chest since stopping plavix 3 days ago.  She will restart Plavix today, reschedule dental appt. and go to urgent care in [facility] to get this "squeezing sensation" in her chest checked out before driving home.  She will call tomorrow with an update on this. Mylo Red RN

## 2010-04-18 NOTE — Progress Notes (Signed)
Summary: stoping plavix for dental work   Phone Note Call from Patient Call back at [phone]   Caller: Patient Reason for Call: Talk to Nurse Summary of Call: pt states she needs something in in writing stating its ok for her to stop plavix to get dental work done.  pt is going to the dentist nex week. Initial call taken by: Roe Coombs,  February 21, 2010 10:35 AM  Follow-up for Phone Call        [facility]. Mylo Red RN  Additional Follow-up for Phone Call Additional follow up Details #1::        OK to hold, start back ASAP. Additional Follow-up by: Gaylord Shih, MD, [facility],  February 21, 2010 12:28 PM     Appended Document: stoping plavix for dental work [facility] on cell. Mylo Red RN  Appended Document: stoping plavix for dental work Pt is aware ok to stop Plavix for dental work.  Dr. Lawrence Marseilles DDS in [place], [place]  Fax [phone].  Phone #  [phone].  Mylo Red RN

## 2010-05-27 LAB — COMPREHENSIVE METABOLIC PANEL
AST: 44 U/L — ABNORMAL HIGH (ref 0–37)
Albumin: 3.8 g/dL (ref 3.5–5.2)
Alkaline Phosphatase: 65 U/L (ref 39–117)
Chloride: 104 mEq/L (ref 96–112)
GFR calc Af Amer: >60 mL/min (ref >60)
Potassium: 3.9 mEq/L (ref 3.5–5.1)
Sodium: 140 mEq/L (ref 135–145)
Total Bilirubin: 0.7 mg/dL (ref 0.3–1.2)

## 2010-05-27 LAB — DIFFERENTIAL
Basophils Absolute: 0.1 10*3/uL (ref 0.0–0.1)
Eosinophils Relative: 1 % (ref 0–5)
Eosinophils Relative: 1 % (ref 0–5)
Lymphocytes Relative: 20 % (ref 12–46)
Lymphocytes Relative: 9 % — ABNORMAL LOW (ref 12–46)
Lymphs Abs: 0.6 10*3/uL — ABNORMAL LOW (ref 0.7–4.0)
Lymphs Abs: 1.4 10*3/uL (ref 0.7–4.0)
Monocytes Absolute: 0.6 10*3/uL (ref 0.1–1.0)
Neutro Abs: 5 10*3/uL (ref 1.7–7.7)
Neutrophils Relative %: 80 % — ABNORMAL HIGH (ref 43–77)

## 2010-05-27 LAB — URINE MICROSCOPIC-ADD ON

## 2010-05-27 LAB — POCT PREGNANCY, URINE: Preg Test, Ur: NEGATIVE

## 2010-05-27 LAB — CBC
HCT: 38.2 % (ref 36.0–46.0)
HCT: 41.6 % (ref 36.0–46.0)
MCH: 30.3 pg (ref 26.0–34.0)
MCHC: 33.8 g/dL (ref 30.0–36.0)
MCV: 89.7 fL (ref 78.0–100.0)
Platelets: 202 10*3/uL (ref 150–400)
Platelets: 215 10*3/uL (ref 150–400)
RBC: 4.64 MIL/uL (ref 3.87–5.11)
WBC: 7.2 10*3/uL (ref 4.0–10.5)

## 2010-05-27 LAB — GC/CHLAMYDIA PROBE AMP, GENITAL
Chlamydia, DNA Probe: NEGATIVE
GC Probe Amp, Genital: POSITIVE — AB

## 2010-05-27 LAB — BASIC METABOLIC PANEL
Chloride: 103 mEq/L (ref 96–112)
Creatinine, Ser: 0.83 mg/dL (ref 0.4–1.2)
GFR calc Af Amer: >60 mL/min (ref >60)
GFR calc Af Amer: >60 mL/min (ref >60)
GFR calc non Af Amer: >60 mL/min (ref >60)
Potassium: 4 mEq/L (ref 3.5–5.1)
Potassium: 4.2 mEq/L (ref 3.5–5.1)

## 2010-05-27 LAB — URINALYSIS, ROUTINE W REFLEX MICROSCOPIC
Nitrite: NEGATIVE
Specific Gravity, Urine: 1.027 (ref 1.005–1.030)
Urobilinogen, UA: 1 mg/dL (ref 0.0–1.0)
pH: 7 (ref 5.0–8.0)

## 2010-05-27 LAB — CARDIAC PANEL(CRET KIN+CKTOT+MB+TROPI)
CK, MB: 1.1 ng/mL (ref 0.3–4.0)
Relative Index: 0.9 (ref 0.0–2.5)

## 2010-05-27 LAB — POCT CARDIAC MARKERS: CKMB, poc: <1 ng/mL — ABNORMAL LOW (ref 1.0–8.0)

## 2010-05-27 LAB — WET PREP, GENITAL: Trich, Wet Prep: NONE SEEN

## 2010-05-27 LAB — CK TOTAL AND CKMB (NOT AT ARMC): Relative Index: 0.9 (ref 0.0–2.5)

## 2010-05-27 LAB — APTT: aPTT: 22 seconds — ABNORMAL LOW (ref 24–37)

## 2010-05-28 LAB — RAPID URINE DRUG SCREEN, HOSP PERFORMED
Amphetamines: NOT DETECTED
Barbiturates: NOT DETECTED
Benzodiazepines: NOT DETECTED
Cocaine: POSITIVE — AB
Opiates: NOT DETECTED
Tetrahydrocannabinol: NOT DETECTED

## 2010-05-28 LAB — GLUCOSE, CAPILLARY
Glucose-Capillary: 159 mg/dL — ABNORMAL HIGH (ref 70–99)
Glucose-Capillary: 74 mg/dL (ref 70–99)
Glucose-Capillary: 82 mg/dL (ref 70–99)
Glucose-Capillary: 86 mg/dL (ref 70–99)
Glucose-Capillary: 99 mg/dL (ref 70–99)

## 2010-05-28 LAB — CBC
HCT: 37.7 % (ref 36.0–46.0)
Hemoglobin: 12.4 g/dL (ref 12.0–15.0)
Hemoglobin: 12.8 g/dL (ref 12.0–15.0)
MCH: 29.2 pg (ref 26.0–34.0)
MCH: 29.2 pg (ref 26.0–34.0)
MCHC: 32.7 g/dL (ref 30.0–36.0)
MCHC: 32.9 g/dL (ref 30.0–36.0)
MCV: 89.1 fL (ref 78.0–100.0)
Platelets: 273 10*3/uL (ref 150–400)
RBC: 4.39 MIL/uL (ref 3.87–5.11)

## 2010-05-28 LAB — PROTEIN C, TOTAL: Protein C, Total: 73 % (ref 70–140)

## 2010-05-28 LAB — COMPREHENSIVE METABOLIC PANEL
ALT: 67 U/L — ABNORMAL HIGH (ref 0–35)
ALT: 69 U/L — ABNORMAL HIGH (ref 0–35)
AST: 85 U/L — ABNORMAL HIGH (ref 0–37)
Albumin: 3.4 g/dL — ABNORMAL LOW (ref 3.5–5.2)
Alkaline Phosphatase: 68 U/L (ref 39–117)
CO2: 24 mEq/L (ref 19–32)
CO2: 26 mEq/L (ref 19–32)
Calcium: 8.7 mg/dL (ref 8.4–10.5)
Chloride: 103 mEq/L (ref 96–112)
Chloride: 103 mEq/L (ref 96–112)
GFR calc Af Amer: >60 mL/min (ref >60)
GFR calc non Af Amer: >60 mL/min (ref >60)
GFR calc non Af Amer: >60 mL/min (ref >60)
Glucose, Bld: 75 mg/dL (ref 70–99)
Potassium: 3.6 mEq/L (ref 3.5–5.1)
Sodium: 138 mEq/L (ref 135–145)
Total Bilirubin: 1.1 mg/dL (ref 0.3–1.2)
Total Bilirubin: 1.7 mg/dL — ABNORMAL HIGH (ref 0.3–1.2)

## 2010-05-28 LAB — BETA-2-GLYCOPROTEIN I ABS, IGG/M/A
Beta-2 Glyco I IgG: 0 G Units (ref <20)
Beta-2-Glycoprotein I IgA: 2 A Units (ref <20)
Beta-2-Glycoprotein I IgM: 3 M Units (ref <20)

## 2010-05-28 LAB — POCT I-STAT, CHEM 8
BUN: <3 mg/dL — ABNORMAL LOW (ref 6–23)
Creatinine, Ser: 1.1 mg/dL (ref 0.4–1.2)
Glucose, Bld: 87 mg/dL (ref 70–99)
Hemoglobin: 15 g/dL (ref 12.0–15.0)
Sodium: 142 mEq/L (ref 135–145)
TCO2: 27 mmol/L (ref 0–100)

## 2010-05-28 LAB — URINALYSIS, MICROSCOPIC ONLY
Ketones, ur: NEGATIVE mg/dL
Leukocytes, UA: NEGATIVE
Nitrite: NEGATIVE
Specific Gravity, Urine: 1.033 — ABNORMAL HIGH (ref 1.005–1.030)
Urobilinogen, UA: 1 mg/dL (ref 0.0–1.0)
pH: 5.5 (ref 5.0–8.0)

## 2010-05-28 LAB — URINALYSIS, ROUTINE W REFLEX MICROSCOPIC
Glucose, UA: NEGATIVE mg/dL
Ketones, ur: NEGATIVE mg/dL
Nitrite: NEGATIVE
Specific Gravity, Urine: 1.011 (ref 1.005–1.030)
pH: 5.5 (ref 5.0–8.0)

## 2010-05-28 LAB — LUPUS ANTICOAGULANT PANEL
Lupus Anticoagulant: NOT DETECTED
PTT Lupus Anticoagulant: 34.8 secs (ref 30.0–45.6)

## 2010-05-28 LAB — DIFFERENTIAL
Basophils Relative: 1 % (ref 0–1)
Eosinophils Absolute: 0.1 10*3/uL (ref 0.0–0.7)
Eosinophils Relative: 2 % (ref 0–5)
Lymphs Abs: 1.5 10*3/uL (ref 0.7–4.0)
Monocytes Relative: 9 % (ref 3–12)

## 2010-05-28 LAB — CARDIAC PANEL(CRET KIN+CKTOT+MB+TROPI): Relative Index: 0.7 (ref 0.0–2.5)

## 2010-05-28 LAB — PROTEIN S ACTIVITY: Protein S Activity: 97 % (ref 69–129)

## 2010-05-28 LAB — LIPID PANEL
Cholesterol: 208 mg/dL — ABNORMAL HIGH (ref 0–200)
HDL: 115 mg/dL (ref >39)

## 2010-05-28 LAB — HOMOCYSTEINE: Homocysteine: 11.4 umol/L (ref 4.0–15.4)

## 2010-05-28 LAB — PROTEIN S, TOTAL: Protein S Ag, Total: 110 % (ref 70–140)

## 2010-05-28 LAB — POCT PREGNANCY, URINE: Preg Test, Ur: NEGATIVE

## 2010-05-28 LAB — CARDIOLIPIN ANTIBODIES, IGG, IGM, IGA: Anticardiolipin IgG: 3 GPL U/mL — ABNORMAL LOW (ref <23)

## 2010-05-28 LAB — APTT: aPTT: 24 seconds (ref 24–37)

## 2010-05-28 LAB — FACTOR 5 LEIDEN

## 2010-05-28 LAB — HEMOCCULT GUIAC POC 1CARD (OFFICE): Fecal Occult Bld: NEGATIVE

## 2010-05-28 LAB — CK TOTAL AND CKMB (NOT AT ARMC): Relative Index: 0.7 (ref 0.0–2.5)

## 2010-05-28 LAB — PROTHROMBIN GENE MUTATION

## 2010-05-28 LAB — PROTEIN C ACTIVITY: Protein C Activity: 77 % (ref 75–133)

## 2010-05-29 LAB — URINALYSIS, ROUTINE W REFLEX MICROSCOPIC
Bilirubin Urine: NEGATIVE
Bilirubin Urine: NEGATIVE
Glucose, UA: NEGATIVE mg/dL
Hgb urine dipstick: NEGATIVE
Ketones, ur: NEGATIVE mg/dL
Nitrite: NEGATIVE
Nitrite: NEGATIVE
Specific Gravity, Urine: 1.016 (ref 1.005–1.030)
Specific Gravity, Urine: 1.018 (ref 1.005–1.030)
Urobilinogen, UA: 1 mg/dL (ref 0.0–1.0)
pH: 5.5 (ref 5.0–8.0)
pH: 6 (ref 5.0–8.0)

## 2010-05-29 LAB — URINE MICROSCOPIC-ADD ON

## 2010-05-29 LAB — URINE CULTURE

## 2010-05-29 LAB — GC/CHLAMYDIA PROBE AMP, GENITAL
Chlamydia, DNA Probe: NEGATIVE
GC Probe Amp, Genital: NEGATIVE

## 2010-05-29 LAB — PREGNANCY, URINE: Preg Test, Ur: NEGATIVE

## 2010-05-29 LAB — WET PREP, GENITAL: Yeast Wet Prep HPF POC: NONE SEEN

## 2010-06-01 LAB — COMPREHENSIVE METABOLIC PANEL
ALT: 40 U/L — ABNORMAL HIGH (ref 0–35)
AST: 50 U/L — ABNORMAL HIGH (ref 0–37)
Albumin: 3.2 g/dL — ABNORMAL LOW (ref 3.5–5.2)
Calcium: 9 mg/dL (ref 8.4–10.5)
Chloride: 103 mEq/L (ref 96–112)
Creatinine, Ser: 0.89 mg/dL (ref 0.4–1.2)
GFR calc Af Amer: >60 mL/min (ref >60)
Sodium: 139 mEq/L (ref 135–145)
Total Bilirubin: 1.1 mg/dL (ref 0.3–1.2)

## 2010-06-01 LAB — POCT CARDIAC MARKERS
CKMB, poc: 1.1 ng/mL (ref 1.0–8.0)
CKMB, poc: <1 ng/mL — ABNORMAL LOW (ref 1.0–8.0)
Myoglobin, poc: 42.8 ng/mL (ref 12–200)
Troponin i, poc: <0.05 ng/mL (ref 0.00–0.09)

## 2010-06-01 LAB — BASIC METABOLIC PANEL
BUN: 6 mg/dL (ref 6–23)
Calcium: 9 mg/dL (ref 8.4–10.5)
GFR calc non Af Amer: >60 mL/min (ref >60)
Glucose, Bld: 87 mg/dL (ref 70–99)

## 2010-06-01 LAB — CARDIAC PANEL(CRET KIN+CKTOT+MB+TROPI)
CK, MB: 1.5 ng/mL (ref 0.3–4.0)
Relative Index: 0.9 (ref 0.0–2.5)
Total CK: 178 U/L — ABNORMAL HIGH (ref 7–177)
Troponin I: 0.01 ng/mL (ref 0.00–0.06)
Troponin I: 0.02 ng/mL (ref 0.00–0.06)

## 2010-06-01 LAB — LIPID PANEL
Cholesterol: 190 mg/dL (ref 0–200)
HDL: 95 mg/dL (ref >39)
LDL Cholesterol: 80 mg/dL (ref 0–99)
Total CHOL/HDL Ratio: 2 RATIO
VLDL: 15 mg/dL (ref 0–40)

## 2010-06-01 LAB — PREGNANCY, URINE: Preg Test, Ur: NEGATIVE

## 2010-06-01 LAB — CBC
MCHC: 32.1 g/dL (ref 30.0–36.0)
Platelets: 222 10*3/uL (ref 150–400)
RDW: 12.5 % (ref 11.5–15.5)

## 2010-06-01 LAB — DIFFERENTIAL
Basophils Absolute: 0.1 10*3/uL (ref 0.0–0.1)
Basophils Relative: 2 % — ABNORMAL HIGH (ref 0–1)
Neutro Abs: 4.5 10*3/uL (ref 1.7–7.7)
Neutrophils Relative %: 69 % (ref 43–77)

## 2010-06-04 LAB — COMPREHENSIVE METABOLIC PANEL
ALT: 64 U/L — ABNORMAL HIGH (ref 0–35)
Albumin: 4.2 g/dL (ref 3.5–5.2)
Alkaline Phosphatase: 85 U/L (ref 39–117)
Alkaline Phosphatase: 84 U/L (ref 39–117)
BUN: 8 mg/dL (ref 6–23)
BUN: 7 mg/dL (ref 6–23)
CO2: 23 mEq/L (ref 19–32)
Chloride: 104 mEq/L (ref 96–112)
GFR calc non Af Amer: 54 mL/min — ABNORMAL LOW (ref >60)
Glucose, Bld: 118 mg/dL — ABNORMAL HIGH (ref 70–99)
Glucose, Bld: 114 mg/dL — ABNORMAL HIGH (ref 70–99)
Potassium: 3.4 mEq/L — ABNORMAL LOW (ref 3.5–5.1)
Potassium: 4.4 mEq/L (ref 3.5–5.1)
Sodium: 141 mEq/L (ref 135–145)
Total Bilirubin: 1.5 mg/dL — ABNORMAL HIGH (ref 0.3–1.2)
Total Bilirubin: 1.4 mg/dL — ABNORMAL HIGH (ref 0.3–1.2)
Total Protein: 8.4 g/dL — ABNORMAL HIGH (ref 6.0–8.3)

## 2010-06-04 LAB — URINE MICROSCOPIC-ADD ON

## 2010-06-04 LAB — CBC
HCT: 45 % (ref 36.0–46.0)
HCT: 41.6 % (ref 36.0–46.0)
Hemoglobin: 15.1 g/dL — ABNORMAL HIGH (ref 12.0–15.0)
Hemoglobin: 13.6 g/dL (ref 12.0–15.0)
MCHC: 33.4 g/dL (ref 30.0–36.0)
RDW: 12.8 % (ref 11.5–15.5)
WBC: 7 10*3/uL (ref 4.0–10.5)

## 2010-06-04 LAB — POCT CARDIAC MARKERS
Myoglobin, poc: 42 ng/mL (ref 12–200)
Myoglobin, poc: 56.8 ng/mL (ref 12–200)
Myoglobin, poc: 58 ng/mL (ref 12–200)
Troponin i, poc: <0.05 ng/mL (ref 0.00–0.09)

## 2010-06-04 LAB — URINE CULTURE: Colony Count: NO GROWTH

## 2010-06-04 LAB — DIFFERENTIAL
Basophils Absolute: 0 10*3/uL (ref 0.0–0.1)
Basophils Absolute: 0.2 10*3/uL — ABNORMAL HIGH (ref 0.0–0.1)
Basophils Relative: 0 % (ref 0–1)
Basophils Relative: 3 % — ABNORMAL HIGH (ref 0–1)
Eosinophils Absolute: 0.2 10*3/uL (ref 0.0–0.7)
Monocytes Absolute: 0.5 10*3/uL (ref 0.1–1.0)
Monocytes Relative: 5 % (ref 3–12)
Monocytes Relative: 7 % (ref 3–12)
Neutro Abs: 5.9 10*3/uL (ref 1.7–7.7)
Neutro Abs: 5 10*3/uL (ref 1.7–7.7)
Neutrophils Relative %: 82 % — ABNORMAL HIGH (ref 43–77)
Neutrophils Relative %: 72 % (ref 43–77)

## 2010-06-04 LAB — URINALYSIS, ROUTINE W REFLEX MICROSCOPIC
Bilirubin Urine: NEGATIVE
Glucose, UA: NEGATIVE mg/dL
Hgb urine dipstick: NEGATIVE
Ketones, ur: 15 mg/dL — AB
Nitrite: NEGATIVE
Protein, ur: 100 mg/dL — AB
Specific Gravity, Urine: 1.007 (ref 1.005–1.030)
Urobilinogen, UA: 4 mg/dL — ABNORMAL HIGH (ref 0.0–1.0)
Urobilinogen, UA: 0.2 mg/dL (ref 0.0–1.0)

## 2010-06-04 LAB — PROTIME-INR
INR: 0.95 (ref 0.00–1.49)
Prothrombin Time: 12.6 seconds (ref 11.6–15.2)

## 2010-06-04 LAB — HEMOCCULT GUIAC POC 1CARD (OFFICE): Fecal Occult Bld: POSITIVE

## 2010-06-04 LAB — APTT: aPTT: 25 seconds (ref 24–37)

## 2010-06-04 LAB — PREGNANCY, URINE: Preg Test, Ur: NEGATIVE

## 2010-06-05 LAB — POCT I-STAT, CHEM 8
Calcium, Ion: 1.02 mmol/L — ABNORMAL LOW (ref 1.12–1.32)
Chloride: 109 mEq/L (ref 96–112)
Glucose, Bld: 140 mg/dL — ABNORMAL HIGH (ref 70–99)
HCT: 45 % (ref 36.0–46.0)
TCO2: 27 mmol/L (ref 0–100)

## 2010-06-05 LAB — CBC
HCT: 35.4 % — ABNORMAL LOW (ref 36.0–46.0)
Hemoglobin: 11.9 g/dL — ABNORMAL LOW (ref 12.0–15.0)
MCHC: 33.6 g/dL (ref 30.0–36.0)
MCV: 92.1 fL (ref 78.0–100.0)
Platelets: 194 10*3/uL (ref 150–400)
RBC: 3.82 MIL/uL — ABNORMAL LOW (ref 3.87–5.11)
RBC: 4.42 MIL/uL (ref 3.87–5.11)
RDW: 13.3 % (ref 11.5–15.5)
RDW: 13.3 % (ref 11.5–15.5)
WBC: 5.6 10*3/uL (ref 4.0–10.5)

## 2010-06-05 LAB — DIFFERENTIAL
Basophils Absolute: 0 10*3/uL (ref 0.0–0.1)
Basophils Relative: 1 % (ref 0–1)
Eosinophils Absolute: 0.2 10*3/uL (ref 0.0–0.7)
Monocytes Relative: 11 % (ref 3–12)
Neutro Abs: 3 10*3/uL (ref 1.7–7.7)
Neutrophils Relative %: 46 % (ref 43–77)

## 2010-06-05 LAB — BASIC METABOLIC PANEL
BUN: 4 mg/dL — ABNORMAL LOW (ref 6–23)
CO2: 24 mEq/L (ref 19–32)
Calcium: 8.3 mg/dL — ABNORMAL LOW (ref 8.4–10.5)
Calcium: 8.5 mg/dL (ref 8.4–10.5)
Chloride: 112 mEq/L (ref 96–112)
Creatinine, Ser: 0.92 mg/dL (ref 0.4–1.2)
GFR calc Af Amer: >60 mL/min (ref >60)
GFR calc Af Amer: >60 mL/min (ref >60)
GFR calc non Af Amer: >60 mL/min (ref >60)
Glucose, Bld: 143 mg/dL — ABNORMAL HIGH (ref 70–99)
Potassium: 3.3 mEq/L — ABNORMAL LOW (ref 3.5–5.1)
Sodium: 140 mEq/L (ref 135–145)

## 2010-06-05 LAB — HEPATIC FUNCTION PANEL
Albumin: 3.3 g/dL — ABNORMAL LOW (ref 3.5–5.2)
Bilirubin, Direct: 0.2 mg/dL (ref 0.0–0.3)
Total Bilirubin: 0.6 mg/dL (ref 0.3–1.2)

## 2010-06-05 LAB — TSH: TSH: 0.546 u[IU]/mL (ref 0.350–4.500)

## 2010-06-05 LAB — POCT CARDIAC MARKERS
CKMB, poc: <1 ng/mL — ABNORMAL LOW (ref 1.0–8.0)
Troponin i, poc: <0.05 ng/mL (ref 0.00–0.09)
Troponin i, poc: <0.05 ng/mL (ref 0.00–0.09)

## 2010-06-05 LAB — LIPASE, BLOOD: Lipase: 26 U/L (ref 11–59)

## 2010-06-05 LAB — PROTIME-INR: INR: 0.89 (ref 0.00–1.49)

## 2010-06-05 LAB — HEPARIN LEVEL (UNFRACTIONATED): Heparin Unfractionated: 0.24 IU/mL — ABNORMAL LOW (ref 0.30–0.70)

## 2010-06-05 LAB — POCT PREGNANCY, URINE: Preg Test, Ur: NEGATIVE

## 2010-06-05 LAB — APTT: aPTT: 23 seconds — ABNORMAL LOW (ref 24–37)

## 2010-06-05 LAB — D-DIMER, QUANTITATIVE: D-Dimer, Quant: 0.93 ug/mL-FEU — ABNORMAL HIGH (ref 0.00–0.48)

## 2010-06-07 LAB — POCT I-STAT, CHEM 8
BUN: 9 mg/dL (ref 6–23)
Calcium, Ion: 1.19 mmol/L (ref 1.12–1.32)
Chloride: 106 mEq/L (ref 96–112)
Creatinine, Ser: 1 mg/dL (ref 0.4–1.2)
Glucose, Bld: 93 mg/dL (ref 70–99)
HCT: 43 % (ref 36.0–46.0)
Potassium: 4.1 mEq/L (ref 3.5–5.1)

## 2010-06-07 LAB — POCT CARDIAC MARKERS
CKMB, poc: <1 ng/mL — ABNORMAL LOW (ref 1.0–8.0)
Troponin i, poc: <0.05 ng/mL (ref 0.00–0.09)

## 2010-06-22 LAB — POCT I-STAT, CHEM 8
Glucose, Bld: 97 mg/dL (ref 70–99)
HCT: 45 % (ref 36.0–46.0)
Hemoglobin: 15.3 g/dL — ABNORMAL HIGH (ref 12.0–15.0)
Potassium: 3.5 mEq/L (ref 3.5–5.1)

## 2010-06-22 LAB — D-DIMER, QUANTITATIVE: D-Dimer, Quant: 0.47 ug/mL-FEU (ref 0.00–0.48)

## 2010-06-22 LAB — POCT CARDIAC MARKERS: CKMB, poc: <1 ng/mL — ABNORMAL LOW (ref 1.0–8.0)

## 2010-06-23 LAB — WET PREP, GENITAL: Yeast Wet Prep HPF POC: NONE SEEN

## 2010-06-23 LAB — POCT URINALYSIS DIP (DEVICE)
Glucose, UA: NEGATIVE mg/dL
Protein, ur: 30 mg/dL — AB
Urobilinogen, UA: 1 mg/dL (ref 0.0–1.0)

## 2010-06-23 LAB — POCT PREGNANCY, URINE: Preg Test, Ur: NEGATIVE

## 2010-07-30 NOTE — Discharge Summary (Signed)
Lindsey [name], Lindsey [name]               ACCOUNT NO.:  [id number]   MEDICAL RECORD NO.:  [id number]          PATIENT TYPE:  INP   LOCATION:  3714                         FACILITY:  [facility]   PHYSICIAN:  Thomas C. Wall, MD, FACCDATE OF BIRTH:  1970/09/27   DATE OF ADMISSION:  12/13/2007  DATE OF DISCHARGE:  12/21/2007                               DISCHARGE SUMMARY   PRIMARY CARDIOLOGIST:  Maisie Fus C. Wall, MD, [facility]   PRIMARY CARE PHYSICIAN:  Not listed.   HEMATOLOGIST:  Rose Phi. Myna Hidalgo, MD   PROCEDURES PERFORMED DURING HOSPITALIZATION:  1. Cardiac catheterization:  This was completed by Dr. Charlies Constable on      December 20, 2007.      a.     Single vessel coronary artery disease with patent left       anterior descending stent, low normal left ventricular function       with apical akinesis with no high-grade stenosis, treat medically.   FINAL DISCHARGE DIAGNOSES:  1. Coronary artery disease:      a.     Status post myocardial infarction x2 with percutaneous       coronary intervention x1.  2. Transient ischemic attack.  3. Antithrombin III deficiency, followed by Dr. Myna Hidalgo.  4. Obesity.   HOSPITAL COURSE:  A 41 year old [place] American female with history of  antithrombin deficiency and MI x2 with PCI x1 who presented to emergency  room on December 13, 2007, with chest pain.  She described it as  substernal pain radiating to her left shoulder, around 6:00 p.m.  associated with shortness of breath and dizziness.  The patient received  nitroglycerin and had relief of pain.  She had no signs or symptoms of  heart failure.  The patient was admitted to rule out myocardial  infarction and cardiac enzymes were cycled.  The patient's cardiac  enzymes were found to be negative.  The patient had a history of a prior  non-ST elevated MI with a stent to the LAD along with iron deficiency  anemia, and we followed her PT/INR along with hemoglobin and hematocrit  during hospitalization.   Coumadin was discontinued as her INR was  becoming more therapeutic as the patient needed to have cardiac  catheterization in the setting.  The patient was found to be anemic with  a hemoglobin of 8.5 and  MCV of 63.9.  The patient has iron deficiency  anemia.  We consulted Dr. Myna Hidalgo for this issue and also GI to evaluate  for GI bleed.  The patient also admitted it was heavier than normal on  further evaluation.   The patient did have a GI consultation completed on December 15, 2007,  with subsequent EGD completed on December 16, 2007, to evaluate for GI  bleed.  The report revealed abnormal examination revealing  diverticulosis in the ascending colon to sigmoid colon and external  hemorrhoids, otherwise, normal to elevated INR, and the patient was  placed on Plavix.   The patient had no further recurrence of chest discomfort.  She did  receive blood transfusions during the hospitalization x2 with  normalization of hemoglobin on followup, increased to 10.5 with a  hematocrit of 33.4 off Coumadin.  The patient was watched throughout  hospitalization for any recurrence of chest discomfort, and the patient  was found to be stable.  The patient was recently started on iron  secondary to iron-deficiency anemia and plan for cardiac catheterization  on December 20, 2007.   The patient did have cardiac catheterization as described on December 20, 2007, with no intervention necessary.  The catheterization was completed  by Dr. Arvilla Meres.  The patient had no further complaints with  exception of some oozing from the right groin site post procedure.  The  patient was seen and examined by Dr. Juanito Doom on day of discharge, and  it was noted that she continued to have some mild oozing from her groin  but had no recurrence of chest discomfort.  It was found that the  patient had nonobstructive disease with a patent stent to the LAD and EF  of 50%.  The patient was continued on Plavix and  aspirin but Coumadin  remained on hold and has been discontinued.  The patient will follow up  with Dr. Myna Hidalgo in 4 weeks after hospitalization and follow up with Dr.  Juanito Doom in 2 weeks post discharge.   DISCHARGE LABORATORY:  Hemoglobin 10.7, hematocrit 34.0, [place] blood  cells 9.5, and platelets 238.  Sodium 135, potassium 3.9, chloride 105,  CO2 24, glucose 94, BUN 9, and creatinine 0.81.  PT 13.9.  INR 1.0.  Chest x-ray revealing negative for acute cardiopulmonary process, dated  December 13, 2007.   VITAL SIGNS ON DISCHARGE:  Blood pressure 110/73, pulse was 73,  respirations 18, temperature 98.7, and O2 sat 98% on room air.   DISCHARGE MEDICATIONS:  1. Plavix 75 mg daily (prescription provided).  2. Iron complex replacement 150 mg twice a day (prescription      provided).  3. Aspirin 81 mg daily.  4. Coreg 6.25 mg twice a day.  5. Zocor 20 mg at bedtime.   Coumadin has been discontinued.   ALLERGIES:  The patient has no known drug allergies listed.   FOLLOWUP PLANS AND APPOINTMENTS:  1. The patient is to follow up with Dr. Juanito Doom on January 12, 2008,      at 9:00 a.m.  2. The patient is to follow up with Dr. Myna Hidalgo.  The patient is to      call on her own accord to make this appointment as she only      continue concerning her antithrombin II deficiency.  3. The patient has been given postcardiac catheterization instructions      with particular emphasis on the right groin site for evidence of      continuation of bleeding, hematoma, signs of infection, or severe      pain.   Time spent with the patient to include physician time 40 minutes.     Bettey Mare. Lyman Bishop, NP      Jesse Sans. Daleen Squibb, MD, [facility]  Electronically Signed   KML/MEDQ  D:  12/21/2007  T:  12/22/2007  Job:  [id number]   cc:   Rose Phi. Myna Hidalgo, M.D.

## 2010-07-30 NOTE — Assessment & Plan Note (Signed)
[facility]                            CARDIOLOGY OFFICE NOTE   NAME:Lindsey [name], Lindsey [name]                      MRN:          [id number]  DATE:05/08/2008                            DOB:          November 22, 1970    Lindsey [name] walks in today with chest discomfort.   She has been having it off and on all weekend and today is Monday.  She  describes it as a nagging pain in the center of her chest and feels like  it goes up into her left shoulder and down her arm.  This was associated  with some numbness.   She has really had no nausea, vomiting, diaphoresis, but says she is  short of breath on occasion.  It is not clearly exertional related.   Her history is significant for history of coronary artery disease.  She  was hospitalized back in October with chest discomfort quite similar to  this.  She underwent cardiac catheterization, which showed a patent  stent to the LAD and nonobstructive disease, otherwise.   She is extremely noncompliant and frequently misses her appointments.  In fact, she missed her last appointment.   Of note, she did not try sublingual nitroglycerin over the weekend,  though she says she carries that.  It is only a couple of months old.   CURRENT MEDICATIONS:  1. Carvedilol 6.25 b.i.d.  2. Plavix 75 mg per day.  3. Iron supplement 150 mg b.i.d.  4. Zocor 20 mg nightly.  5. Aspirin 81 mg a day.   PHYSICAL EXAMINATION:  VITAL SIGNS:  Her blood pressure is 134/96, her  pulse is 63 and regular, and her weight is 208.  Respiratory rate is 18.  GENERAL:  She is in no acute distress.  SKIN:  Warm and dry.  HEENT:  Normal.  NECK:  Supple.  Carotid upstrokes were equal bilateral without bruits.  No JVD.  Thyroid is not enlarged.  Trachea is midline.  LUNGS:  Clear to  auscultation and percussion.  HEART:  A nondisplaced PMI.  Normal S1 and S2.  No gallop, rub, or  murmur.  ABDOMEN:  Obese with good bowel sounds.  No midline bruit.  No  hepatomegaly.  EXTREMITIES:  No edema.  Pulses are present.  NEUROLOGIC:  Intact.  Her lower extremities also shows no sign of DVT.   Her EKG is completely normal except for borderline low voltage with a  rightward axis.  This is baseline for her.   ASSESSMENT:  1. Probable noncardiac chest pain.  2. Noncompliance.  3. Unwillingness to use nitroglycerin as instructed.   PLAN:  Exercise rest/stress Myoview off of Carvedilol.  If this is a low  risk study and shows no ischemia, we will continue medical therapy.  I  have encouraged to use her nitroglycerin empirically for chest  discomfort.     Thomas C. Daleen Squibb, MD, [facility]  Electronically Signed    TCW/MedQ  DD: 05/08/2008  DT: 05/09/2008  Job #: [id number]

## 2010-07-30 NOTE — H&P (Signed)
NAMEARISHA, GERVAIS NO.:  [id number]   MEDICAL RECORD NO.:  [id number]          PATIENT TYPE:  INP   LOCATION:  6533                         FACILITY:  [facility]   PHYSICIAN:  Unice Cobble, MD     DATE OF BIRTH:  [birth year]   DATE OF ADMISSION:  12/13/2007  DATE OF DISCHARGE:                              HISTORY & PHYSICAL   CARDIOLOGIST:  The patient's cardiologist is Jesse Sans. Wall, M.D., [facility].   CHIEF COMPLAINT:  Chest pain.   HISTORY OF THE PRESENT ILLNESS:  This is a 40 year old African American  female with antithrombin-3 deficiency and history of MI x2 (PCI x1) who  presents with chest pain.  The patient had substernal chest pain  radiating to her left shoulder at 1800 hours associated with shortness  of breath and dizziness.  The pain came on without exertion.  She has  had no diaphoresis.  The patient took sublingual nitroglycerin x1 with  moderate relief, but the pain returned.   In the emergency department she received more nitroglycerin and had  almost complete relief with morphine.  She has had no signs or symptoms  of congestive heart failure.  She is positive for a recent illness with  excessive coughing over the last three days.  The patient tells me that  this chest pain feels just like her prior myocardial infarction.   PAST MEDICAL AND SURGICAL HISTORY:  1. Coronary artery disease status post MI times two with PCI times      one.  2. TIA.  3. Antithrombin-3 deficiency followed by Dr. Myna Hidalgo.  4. Status post tubal ligation.  5. Obesity.  6. Gastritis history.  7. History of Coumadin noncompliance.  8. Last known ejection fraction was 55% on echocardiography performed      October 15, 2007.   ALLERGIES:  No known drug allergies.   MEDICATIONS:  1. Aspirin 81 mg daily.  2. Coumadin 15 mg daily with 20 mg on Thursday.  3. Simvastatin 20 mg at bedtime.  4. Carvedilol 6.5 mg twice a day.   SOCIAL HISTORY:  The patient lives in [place]  with her husband and 2  children ages 68 and 33.  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  No  tobacco, alcohol or drugs.   FAMILY HISTORY:  The patient's mother has hypertension and DVT history.  Her father is alive and well.  Siblings; a brother died prematurely at  the age of 60 from a rhythm abnormality.   REVIEW OF SYSTEMS:  CONSTITUTIONAL:  The patient complains of recent  fevers and chills as well as a cough for over three days, which is just  starting to get better.  Otherwise the complete review of systems is  done and found to be otherwise negative, except as stated in the HPI.   PHYSICAL EXAMINATION:  VITAL SIGNS: Temperature 98.4 with a pulse of 92,  respiratory rate 16, blood pressure 138/72, and O2 sat 99% on room air.  GENERAL APPEARANCE:  In general she is in no acute distress.  She is  obese.  HEENT:  The head, eyes, ears, nose, and throat show normocephalic and  atraumatic head.  PERRLA.  EOMI.  Moist mucous membranes.  Oropharynx  without erythema or exudates.  NECK:  The neck is supple without lymphadenopathy, thyromegaly, bruits  or jugular venous distention.  HEART:  The heart has a regular rate and rhythm with a normal S1 and S2.  No murmurs, gallops or rubs.  Normal PMI.  Pulses 2+ and equal  bilaterally without bruits.  LUNGS:  The lungs are clear to auscultation bilaterally without  wheezing, rhonchi or rales.  SKIN:  The skin shows no rashes or lesions.  ABDOMEN:  The abdomen is soft and nontender with normal bowel sounds.  No rebound or guarding.  Negative hepatosplenomegaly.  EXTREMITIES:  The extremities show no cyanosis, clubbing or edema.  MUSCULOSKELETAL:  The musculoskeletal exam shows no joint deformity or  effusions.  NEUROLOGIC EXAMINATION:  Neurologically she is alert and oriented times  three.  Cranial nerves II-XII are grossly intact.  Strength is 5/5 in  all extremities and __________ .  Grossly normal sensation throughout.   LABORATORY  DATA:  Radiology:  Chest x-ray showed no acute  cardiopulmonary disease.  EKG shows a rate of 84 and normal sinus  rhythm.  She has questionable Qs in her septal leads.  She has no ST or  T wave changes indicative of ischemia.   Labs:  Her [place] count is 7.3 with a hemoglobin of 10.9.  Her basic  metabolic panel is unremarkable with a creatinine of 1.0.  Her INR is  therapeutic at 3.4.  CK/MB and troponin are negative at this time.   ASSESSMENT AND PLAN:  This is a 40 year old African American female with  antithrombin-3 deficiency and history of myocardial infarction with  percutaneous coronary intervention who presents with chest pain  concerning for unstable angina.   I will admit her for rule out myocardial infarction as she is troponin  negative at this time.  If her negativity continues then she will need a  stress test in the morning.  If she becomes positive then she will be  catheterized at some point in the future when her INR falls.  I will  hold her Coumadin tonight as her INR is mildly supertherapeutic and she  may need a procedure tomorrow.  She will be made NPO and her other  medications will be continued.  She will receive Zantac for  gastrointestinal prophylaxis.      Unice Cobble, MD  Electronically Signed     ACJ/MEDQ  D:  12/13/2007  T:  12/14/2007  Job:  [id number]

## 2010-07-30 NOTE — Assessment & Plan Note (Signed)
[facility]                            CARDIOLOGY OFFICE NOTE   NAME:[name], Lindsey KAYLOR                      MRN:          [id number]  DATE:10/13/2007                            DOB:          21-Jul-1970    Lindsey [name] returns today for further management of the following issues:  1. History of a probable antithrombin III deficiency, followed by Dr.      Arlan Organ, associated with recurrent coronary thrombosis, as      well as a previous mild stroke and transient ischemic attack in      2007.   Unfortunately, Lindsey [name] does not get the point or the message.  She is  not compliant with her Coumadin, and Dr. Jimmey Ralph comes today saying that  she will give her one more chance to be compliant, follow up in the  clinic for protime checks, or will have to stop it.  I concur 100%.  I  have discussed this quite candidly with Lindsey [name] today.   She tells me today that she was told she had a hole in her heart years  ago by Dr. Anne Hahn on the second floor.  This is first I have heard of  that.  I have looked at an echocardiogram dated June 11, 1998, which  did not show any sign of a patent foramen or an ASD.   She is asymptomatic.  She continues to work full-time for [employer] of  [place] and has two children 3 and 8.   Lindsey [name] started back on Coumadin today as directed.  Her INR was  0.9.  She is on aspirin 81 mg a day, Zocor 20 nightly, and Carvedilol  6.2 b.i.d.   She has had no chest pain, angina, orthopnea, PND, peripheral edema, or  tachy palpitations.   PHYSICAL EXAMINATION:  VITAL SIGNS:  Her blood pressure today is 137/85,  her pulse is 85 and regular, and her weight is 210.  HEENT:  Unchanged.  Carotid upstrokes are equal bilaterally without  bruits, no JVD.  Thyroid is not enlarged.  Trachea is midline.  LUNGS:  Clear.  HEART:  A poorly appreciated PMI.  Soft S1, S2.  ABDOMEN:  Soft, good bowel sounds.  EXTREMITIES:  No edema.  Pulses are intact.  NEURO:  Intact.   Again, I have had a long talk with Lindsey [name] today about the absolute  imperative nature of taking her Coumadin.  If she does not, we will stop  the drug and quite frankly she can find another Cardiology Group to  follow her and provide care.  I told her this is clearly in her best  interest and that she has dodged several serious life-threatening  events, as noted in the chart.  In addition, she has a 53-year-old and 43-  year-old and that she needs to take responsibility for this as well.  She is well educated and she knows better as I have told her!   PLAN:  1. Follow up closely with Lindsey [name] in the Coumadin Clinic.  2. A 2-D echocardiogram to rule out ASD or patent foramen.  3. Continue aspirin, Zocor, and Carvedilol.  4. I will get her back in 3 months for accountability.  At that time,      we will get fasting lipids and LFTs.     Thomas C. Daleen Squibb, MD, [facility]  Electronically Signed    TCW/MedQ  DD: 10/13/2007  DT: 10/14/2007  Job #: [id number]

## 2010-07-30 NOTE — Consult Note (Signed)
NAMETENECIA, IGNASIAK               ACCOUNT NO.:  [id number]   MEDICAL RECORD NO.:  [id number]          PATIENT TYPE:  OBV   LOCATION:  4743                         FACILITY:  [facility]   PHYSICIAN:  Rose Phi. Myna Hidalgo, M.D. DATE OF BIRTH:  [birth year]   DATE OF CONSULTATION:  06/25/2007  DATE OF DISCHARGE:                                 CONSULTATION   REASON FOR CONSULTATION:  1. History of myocardial infarction.  2. Possible antithrombin III deficiency.   HISTORY OF PRESENT ILLNESS:  Ms. Risenhoover is a very charming 40 year old  African American female who recently relocated from [place],  [place].  She has been down to [facility] for about 5 years.   She apparently had an anterior wall myocardial infarction back in 2000.  She went to cardiac arrest.  She had 2 stents.  She was subsequently  placed on anticoagulation.  She had been told that she had antithrombin  III deficiency.  From where she tells me, she has never seen a  hematologist.  She is not sure of any blood work that has ever been done  for this.   She says that she was about to be tested but then became pregnant.  She  was placed on Lovenox during her pregnancy.  She has had pregnancy 12  years ago without any difficulties.  She has had no miscarriages.   She was apparently on Coumadin for 2 years.   She then presented with what appeared to be a TIA.  This was back in  October 2007.  She underwent evaluation with MRI and CT.  Everything  looked fine on MRI angiogram.  She had an echocardiogram, which was  normal.  She was placed on Aggrenox.   Again from then, she has done pretty well.  She recently moved back up  here to [place].  She works in [place].   She woke up.  She got to work this morning.  She had chest pain.  This  has progressed.  This is a substernal pain.  It began to radiate to her  left arm.  When she began to have the pain radiation, she came to the  emergency room.  She was seen by Dr. Valera Castle.  Dr. Daleen Squibb admitted  her.   Dr. Daleen Squibb kindly asked [place] to see her to see if could help to sort out the  issue with hypercoagulability.   Ms. Tallon says that her mother has had a blood clot in the past.  Recently, her brother apparently died of cardiac arrhythmia at age 19.   PAST MEDICAL HISTORY:  Relatively unremarkable.   ALLERGIES:  She has no allergies.   ADMISSION MEDICATIONS:  1. Aspirin 1 daily.  2. Plavix 75 mg daily.   SOCIAL HISTORY:  She did have a history of tobacco use in the past.  She  stopped when she had the first heart attack.  She also, I think, was on  oral contraceptives when she had her first heart attack.   FAMILY HISTORY:  Remarkable for hypertension.  Otherwise, there is  history of  DVT with  her mother and early cardiac arrest with her  brother.   REVIEW OF SYSTEMS:  Remarkable for the chest pain.  She has some  shortness of breath.  She had no rashes.  There has been no leg  swelling.  She has some fatigue.   PHYSICAL EXAMINATION:  GENERAL:  This is a somewhat obese, black female  in no obvious distress.  VITAL SIGNS:  98.3, pulse 72, respiratory 24, blood pressure 135/88.  HEAD AND NECK:  Normocephalic and atraumatic skull.  There are no ocular  or oral lesions.  There are no palpable cervical or supraclavicular  lymph nodes.  LUNGS:  Clear bilaterally.  CARDIAC:  Regular rate and rhythm with normal S1, S2.  There are no  murmurs, rubs, or bruits.  ABDOMINAL:  Soft with good bowel sounds.  There is no palpable abdominal  mass.  No palpable hepatosplenomegaly.  EXTREMITIES:  No clubbing,  cyanosis, or edema.  She has no palpable venous cords in her leg.  NEUROLOGIC:  No focal neurological deficits.   LABORATORY DATA:  Eckles count 7.2, hemoglobin 11.4, hematocrit 34.4, and  platelet count 198.   Her INR is 0.9.  Her PTT is 26.   IMPRESSION:  Ms. Porchia is a charming 40 year old black female with  history of myocardial infarction 9 years  ago.  She apparently was found  to have antithrombin III deficiency.  Again, I am not sure at all as to  whether or not this was documented.   She currently is on low-molecular weight heparin.  I am not sure whether  we will be able to measure her levels right now.  We are certainly going  to have to do this with her off Lovenox.   I certainly cannot send of the hypercoagulable panel for other issues.  We could certainly send off hypercoagulable panel to check for factor V  Leiden, prothrombin gene-2 mutation, anticardiolipin antibodies, lupus  anticoagulant, and protein 2/protein S.   Once we get her anticoagulated back on Coumadin, I think we can then  check for antithrombin III levels.   Given Ms. Keeter's  history, I really believe that she is going to need a  lifelong anticoagulation.  This would feel very uncomfortable with her  and not on something long-term.  I realize that this could be a hardship  for her, but I think she is showing that she is hypercoagulable.   I certainly would get a CT scan of her chest to make sure she has not  had a pulmonary embolism.   It is certainly possible this may be pericarditis.  She says that the  pain was worse when she leaned over.  Her EKG was relatively  nonspecific, however.   I certainly will be interested to see what the cath shows.  Antithrombin  III deficiency certainly is at high risk for thromboembolic events.  These typically are venous, however, but yet arterial certainly would  not be surprising to me.   We are happy to follow along and help with respect to anticoagulation  issues.  Pharmacy will manage her Lovenox right now.   Post cardiac cath, we would get her back on Coumadin and keep her INR  between 2.5 and 3.   Thanks much for allowing [place] to see Ms. Pracht.  Again, we will follow  along.      Rose Phi. Myna Hidalgo, M.D.  Electronically Signed     PRE/MEDQ  D:  06/25/2007  T:  06/26/2007  Job:  [id number]   cc:    Thomas C. Wall, MD, [facility]

## 2010-07-30 NOTE — Cardiovascular Report (Signed)
NAMESAI, MOURA NO.:  [id number]   MEDICAL RECORD NO.:  [id number]          PATIENT TYPE:  INP   LOCATION:  3714                         FACILITY:  [facility]   PHYSICIAN:  Bevelyn Buckles. Bensimhon, MDDATE OF BIRTH:  Aug 08, 1970   DATE OF PROCEDURE:  12/20/2007  DATE OF DISCHARGE:                            CARDIAC CATHETERIZATION   PATIENT IDENTIFICATION:  Lindsey [name] is a delightful 40 year old woman  with a history of previous apical myocardial infarction, status post LAD  stenting.  She was admitted with chest pain.  Cardiac markers were  negative.  She is brought to the cardiac cath lab for diagnostic  angiography.   PROCEDURES PERFORMED:  1. Selective coronary angiography.  2. Left heart cath.  3. Left ventriculogram.  4. StarClose femoral artery closure.   DESCRIPTION OF PROCEDURE:  The risks and indications of the  catheterization were explained.  Consent was signed and placed on the  chart.  A 5-French arterial sheath was placed in the right femoral  artery using a modified Seldinger technique.  Standard JL-4, JR-4 angled  pigtails were used for the catheterization.  All catheter exchanges made  over wire.  There are no apparent complications.   Central aortic pressure was 129/92 with a mean of 110.  LV pressure was  of 145/7 with an EDP of 16.  There was no aortic stenosis.   Left main was normal.   LAD was a long vessel wrapping the apex, gave off two diagonals in the  proximal LAD.  There was a previously placed stent.  There was a 30% in-  stent restenosis.  In the mid LAD, there was a long segment of  myocardial bridging with no evidence of flow-limiting stenosis.   Left circumflex gave off a very large branching OM-1 and a moderate-  sized OM-2, it was angiographically normal.   Right coronary artery was a dominant vessel, gave off an RV branch, a  large acute marginal large PDA, angiographically normal.   Left ventriculogram done in the RAO  position showed an EF of  approximately 50%.  There was apical akinesis with a mild apical  aneurysm.   ASSESSMENT:  1. One-vessel coronary artery disease with patent left anterior      descending stent with minimal in-stent restenosis.  2. Low normal left ventricular function with apical akinesis which is      chronic.  3. No high-grade coronary stenoses.   PLAN:  Will be for medical therapy.  She can be discharged home in the  morning if her groin is stable.  Continue aspirin and Plavix.      Bevelyn Buckles. Bensimhon, MD  Electronically Signed     DRB/MEDQ  D:  12/20/2007  T:  12/20/2007  Job:  [id number]

## 2010-07-30 NOTE — Discharge Summary (Signed)
NAMEJAMECIA, Lindsey [name] NO.:  [id number]   MEDICAL RECORD NO.:  [id number]          PATIENT TYPE:  INP   LOCATION:  2013                         FACILITY:  [facility]   PHYSICIAN:  Jesse Sans. Wall, MD, FACCDATE OF BIRTH:  1970-05-23   DATE OF ADMISSION:  06/25/2007  DATE OF DISCHARGE:  06/30/2007                               DISCHARGE SUMMARY   PROCEDURES:  1. Cardiac catheterization.  2. Coronary arteriogram.  3. Left ventriculogram  4. PTCA and stent to one vessel.   PRIMARY, FINAL, DISCHARGE DIAGNOSIS:  Non-ST segment elevation  myocardial infarction.   SECONDARY DIAGNOSES:  1. Status post anterior wall myocardial infarction with a ventricular      fibrillation arrest and percutaneous intervention to the left      anterior descending in 2000.  2. History of chest pain in 2002 with an adenosine Myoview showing      atypical scar mild peri-infarct ischemia, ejection fraction 51.  3. Dyslipidemia with a total cholesterol of 156, triglycerides 64, HDL      42, and LDL 101.  4. Obesity.  5. Possible transient ischemic attack in 2007.  6. 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  7. History of gastritis.  8. History of panic attacks.   TIME AT DISCHARGE:  51 minutes.   HOSPITAL COURSE:  Lindsey [name] is a 40 year old female with a history of  coronary artery disease.  She had sudden onset of substernal chest pain  on the day of admission with minimal activity.  Because the symptoms  were similar to her MI symptoms, it did not resolve.  The EMS was called  and sublingual nitroglycerin improved her pain.  She was admitted for  further evaluation and treatment.   Her cardiac enzymes were initially negative with negative point-of-care  markers x1.  However, she had a crescendo in her cardiac enzymes with a  peak CK-MB of 190/10.1 and a peak troponin I of 1.74.  Cardiac  catheterization was performed on June 28, 2007, and  showed a 75% LAD  that by intravascular ultrasound had an eccentric plaque.  This was  treated with PTCA and a bare metal stent reducing the stenosis to zero.  Her EF was 65% but it was felt that she had mild left ventricular  dysfunction consistent with prior intra apical MI.  There was no  significant disease in the circumflex or RCA symptoms.   She had a reported history of antithrombin III deficiency and had been  on Coumadin at one time, but her physician at that time had discontinued  it about 4 years ago.  She was seen by Dr. Myna Hidalgo, in the hospital and  labs were drawn to assess for an antithrombin III deficiency.  These  were negative, but Dr. Myna Hidalgo, felt that she should still be on  Coumadin.  She did not feel that full cross coverage is indicated.  Her  INR at discharge is 0.9.  She has had 10 mg on the 13th, 12.5 mg on the  14th, and will take  12.5 mg on the 15th.  She will then go to 10 mg a  day and have a Coumadin check in 2 days.   Lindsey [name] was seen by cardiac rehab during her stay and will follow up  with them as an outpatient.  Initially, she had some bradycardia and the  beta blocker was not started.  However prior to discharge, she is being  started on low-dose Coreg and hopefully will tolerate this well.   By June 30, 2007, Lindsey [name] was ambulating without chest pain or  shortness of breath.  Her hypercoagulable panel was within normal  limits, but Dr. Myna Hidalgo still felt the Coumadin was needed.  Pending  evaluation by Dr. Daleen Squibb.  Lindsey [name] is tentatively considered stable for  discharge on June 30, 2007, with close outpatient follow-up.   DISCHARGE INSTRUCTION:  Her activity level is to be increased gradually.  She is to call our office for problems with the cath site.  She is not  to drive for 3 days and no lifting for 2 weeks.  She is not to return to  work until cleared by cardiology.   DISCHARGE MEDICATIONS:  1. Coumadin 5 mg 2 tablets daily as  directed.  2. Plavix 75 mg daily.  3. Nitroglycerin sublingual p.r.n.  4. Aspirin 81 mg a day.  5. Coreg 3.125 mg b.i.d.  6. Zocor 20 mg a day.      Theodore Demark, PA-C      Jesse Sans. Daleen Squibb, MD, [facility]  Electronically Signed    RB/MEDQ  D:  06/30/2007  T:  07/01/2007  Job:  [id number]   cc:   Rose Phi. Myna Hidalgo, M.D.  [facility]  Dr. Lonna Cobb

## 2010-07-30 NOTE — Cardiovascular Report (Signed)
NAMEJAMIELYNN, Lindsey [name] NO.:  [id number]   MEDICAL RECORD NO.:  [id number]           PATIENT TYPE:   LOCATION:                                 FACILITY:   PHYSICIAN:  Veverly Fells. Excell Seltzer, MD  DATE OF BIRTH:  December 12, 1970   DATE OF PROCEDURE:  DATE OF DISCHARGE:                            CARDIAC CATHETERIZATION   PROCEDURES:  1. Left heart catheterization.  2. Selective coronary angiography.  3. Left ventricular angiography.  4. Intravascular ultrasound of the left anterior descending.  5. Stenting of the proximal left anterior descending.  6. Angio-Seal of the right femoral artery.   INDICATIONS:  Lindsey [name] is a 40 year old woman, who has a  hypercoagulable disorder.  She has had a prior anterior wall myocardial  infarction as well as a TIA.  She carries a diagnosis of antithrombin  III deficiency, although the definitive diagnosis is somewhat unclear.  She presented with an acute coronary syndrome and ruled in for a non-ST  elevation MI.  She was referred for cardiac catheterization.   DESCRIPTION OF PROCEDURE:  Risks and indications of the procedure were  reviewed with the patient.  Informed consent was obtained.  Her right  groin was prepped, draped, and anesthetized with 1% lidocaine using  modified Seldinger technique.  A 6-French sheath was placed in the right  femoral artery.  Standard 6-French Judkins catheters were used for  coronary angiography.  An angled pigtail catheter was used for left  ventriculography.   At the completion of the diagnostic procedure, I elected to intervene on  a lesion in the proximal LAD.  There was a moderately tight stenosis,  visually estimated of 75%.  It was hypodense in appearance.  It was the  only significant stenosis throughout her coronary tree.  In the setting  of her non-ST elevation infarct, I thought the area should be treated.  She had received full-dose Lovenox approximately 6 hours prior.  She was  given an  additional 0.3 mg/kg IV of Lovenox.  Integrilin was used for  antiplatelet therapy.  She had also received Plavix, an additional 300  mg was given during the procedure.  A 6-French XB LAD guide catheter was  used, and intracoronary nitroglycerin was given.  The IVUS probe was  passed distally to the lesion without difficulty.  An automated pullback  was performed.  The IVUS showed a very large reference vessel with no  significant plaque.  There was an area of the eccentric soft plaque in  the portion of the vessel of interest.  There was a large plaque burden  and the minimal lumen area was less than 4 sq mm.  There was no other  significant plaque seen in the vessel.  I elected to primarily stent the  vessel with a 4.0- x 15-mm VISION stent, which was deployed at 14  atmospheres.  The stent was well expanded, but appeared slightly  undersized.  The stent was postdilated with a 4.5 x 12-mm Quantum  Maverick balloon.  The balloon was taken to 16 atmospheres over the  proximal portion of the stent.  A post-stent IVUS then demonstrated  excellent stent expansion throughout the proximal portion.  However,  there was distal portion that appeared little bit under deployed and  poorly apposed.  The 4.5-mm Quantum Maverick was taken back, and the  distal portion of the stent was dilated up to 16 atmospheres.  A final  IVUS run showed some improvement although there was an eccentric portion  of vessel where the stent did not appear well opposed.  However, I  thought further dilatation may cause the stent to be too big in other  areas.  I elected to discontinue the procedure as there was an excellent  lumen area inside the stent as well as a nice angiographic result.  An  Angio-Seal device was used to close the femoral arteriotomy.  There were  no immediate complications.   Aortic pressure 127/88 with a mean of 106 and left ventricular pressure  133/90.   Left mainstem is a large vessel with no  significant plaque.  It  bifurcates into the LAD and left circumflex.   The LAD is large-caliber vessel that courses down and wraps around the  LV apex.  There is an area of eccentric plaque in the proximal portion  of the vessel.  Angiographically, it is 75% stenosed.  There is no other  significant stenosis throughout the LAD.  There is a diagonal branch  that arises from the midportion of the LAD with no significant stenosis.  There are no proximal diagonals visualized.   The left circumflex is large caliber.  It supplies a very large  intermediate branch with no significant stenosis.  The AV groove  circumflex courses down and supplies a single branch and no significant  stenosis.  There are multiple branches arising from the intermediate,  none of which have any significant stenosis.   The right coronary artery is relatively small.  It is codominant.  There  are 2 RV marginal branches with no significant stenosis.   Left ventriculography shows apical akinesis.  The remaining portions of  the LV are normal.  The LVEF is estimated at 55%.   ASSESSMENT:  1. Single-vessel coronary artery disease with successful stenting of      the proximal left anterior descending using a bare-metal stent.  2. Mild left ventricular dysfunction consistent with prior      anteroapical myocardial infarction.  3. No significant left circumflex or right coronary artery stenosis.   DISCUSSION:  Lindsey [name] tolerated her procedure well.  She will require  aspirin and Plavix in combination for a minimum of 30 days.  Coumadin  has been recommended in the setting of her hypercoagulable state.  I  would favor only 30 days of Plavix, and she will require long-term  Coumadin.  We will drop her aspirin dose to 81 mg and start Coumadin  tonight.      Veverly Fells. Excell Seltzer, MD  Electronically Signed     MDC/MEDQ  D:  06/28/2007  T:  06/29/2007  Job:  [id number]

## 2010-07-30 NOTE — Assessment & Plan Note (Signed)
[facility]                            CARDIOLOGY OFFICE NOTE   NAME:[name], Lindsey MARSTON                      MRN:          [id number]  DATE:01/07/2008                            DOB:          06-17-1970    [name] returns after being discharged from the hospital.  She was  hospitalized on December 13, 2007, with chest pain.  She was fair and  found to have a fairly profound microcytic iron deficiency anemia.  She  had been on Coumadin.  She did not notice any major bleeding.   She ruled out from myocardial infarction.  She underwent endoscopy and  colonoscopy with findings of diverticulosis and some external  hemorrhoids.  She was also seen by Dr. Arlan Organ of the Hematology  service and tests were rerun showing that she indeed was not  antithrombin III deficient.  Note that her discharge summary states that  this is one of her medical problems.  Dr. Myna Hidalgo clearly wrote in the  chart that she does not have this deficiency.   She underwent cardiac catheterization because of previous coronary  disease and infarcts.  Her stent to her LAD was patent and she had no  obstructive disease otherwise.  Her EF was 50%.   Her discharge hemoglobin was 10.7, platelet count was 238.  She was not  started back on Coumadin.  Plavix was initiated.   Since discharge, she feels the best she has felt.  She denies any  bleeding.  She has had no chest pain.  She is on aspirin 81 mg a day,  Zocor 20 mg q.h.s., Carvedilol 6.25 b.i.d., Plavix 75 mg a day and iron  supplement 150 mg p.o. b.i.d.   Her blood pressure is today is 128/86, her pulse is 88 and regular.  Her  weight is 210. HEENT is normal.  Carotids upstrokes are equal  bilaterally without bruits.  No JVD.  Thyroid is not enlarged.  Trachea  is midline.  Lungs are clear to auscultation and percussion.  Her heart  reveals a regular rate and rhythm.  PMI is difficult to appreciate.  Abdominal exam is soft.   Good bowel sounds.  Extremities reveal no  edema.  Pulses are brisk.  Neuro exam is intact.   I am delighted with how [name] is doing.  She has been approved to go  back to work next Tuesday, 01/11/2008.  She has an appointment with Dr.  Myna Hidalgo January 24, 2008.  We will get fasting CMP and lipids on her  between now and then.  Assuming she is doing, I will see her back again  in 3 months.     Thomas C. Daleen Squibb, MD, [facility]  Electronically Signed    TCW/MedQ  DD: 01/07/2008  DT: 01/08/2008  Job #: [id number]

## 2010-07-30 NOTE — Consult Note (Signed)
NAMECOLLINS, DIMARIA NO.:  [id number]   MEDICAL RECORD NO.:  [id number]          PATIENT TYPE:  INP   LOCATION:  3714                         FACILITY:  [facility]   PHYSICIAN:  Rose Phi. Myna Hidalgo, M.D. DATE OF BIRTH:  14-Aug-1970   DATE OF CONSULTATION:  12/16/2007  DATE OF DISCHARGE:                                 CONSULTATION   REFERRING PHYSICIAN:  Jesse Sans. Wall, MD, [facility]   ROOM NUMBER:  [id number].   REASON FOR CONSULTATION:  1. Iron-deficiency anemia secondary to menometrorrhagia.  2. History of coronary artery disease.  3. Questionable antithrombin III deficiency.   HISTORY OF PRESENT ILLNESS:  Ms. Cadena is a 40 year old African American  female who was seen in the past.  I saw her back in April when she was  admitted because of coronary artery disease.  Apparently, she has  history of anterior wall myocardial infarction back in 2000.  She had  stents placed.  She was told she had antithrombin III deficiency.  This  was all done in [place].   She has not had any DVTs in the legs.  She states that she had a TIA  back in October 2007.  She had normal MRI angiogram.  An echocardiogram  was normal.  She was placed on Aggrenox.   She apparently has not been compliant with her Coumadin reading the  cardiology office notes.  She apparently was seen back in early  September for viral gastroenteritis.   She now was admitted because of chest pain.  All of her enzymes have  been normal.  EKG has also been normal.  There is a septal infarct age  undetermined.   When she came in, she had blood work done.  Her hemoglobin was 8.3,  hematocrit was 27.3.  MCV was 63.  Again, she had been having heavy  menstrual cycles.   Her ferritin was only 7.  This was highly indicative of iron-deficiency  anemia.  Her iron saturation was only 5%.   She had negative Hemoccult stools.   Of note, when back in April, we did 2 checks for her antithrombin III  level.  On April 15,  antithrombin III level was 85.  On April 11, her  antithrombin III level was 137.  She otherwise had normal  hypercoagulable panel.  She had normal functional protein C.  She did  have an elevated IgA anticardiolipin antibody, which is of clinical  insignificance.  She was negative for factor V Leiden mutation and  prothrombin II gene mutation.   We are now asked to see her regarding her anticoagulation.   Her past medical history is well documented.   Her allergies are none.   Her admission medications were aspirin 81 mg daily, Coreg 6.5 mg daily,  simvastatin 20 mg daily, Coumadin 15 mg alternating with 20 mg.   Her social history is negative for tobacco or alcohol use.  She still  works for [employer] [place].   Her family history is remarkable for mother with a history of DVT.   Her review of systems as in the  history of present illness.   PHYSICAL EXAMINATION:  GENERAL:  This is a fairly well-developed, well-  nourished black female in no obvious distress.  VITAL SIGNS:  Temperature of 98.1, pulse 81, respiratory 18, blood  pressure 123/81.  HEENT:  Head and neck exam shows no ocular or oral lesions.  No palpable  cervical or supraclavicular lymph nodes.  LUNGS:  Clear bilaterally.  CARDIAC:  Regular rate and rhythm with no murmurs, rubs, or bruits.  ABDOMEN:  Soft, good bowel sounds.  There is no palpable abdominal mass.  There is no palpable hepatosplenomegaly.  BACK:  No tenderness of the spine, ribs, or hips.  EXTREMITIES:  No clubbing, cyanosis, or edema.  NEUROLOGIC:  No focal neurological deficits.  SKIN:  No rash, ecchymosis, or petechia.   IMPRESSION:  Ms. Treloar is a 40 year old African American female with  anemia.  This is markedly iron deficient.  I suspect this because of her  menstrual cycles.   I think she clearly needs IV iron.  I am not sure if she can be able to  tolerate oral iron.  I think IV iron would be simple to administer.  It  would take out any  problems with compliance.   I guess the bigger issue is whether or not she really is  hypercoagulable.  Again, we never found anything that would suggest  this.  She has had 2 antithrombin III levels that has been normal.  We  will give her the benefit of the doubt and check another one today.  I  think if this third antithrombin III level was normal, then there is  absolutely no way that she has antithrombin III deficiency.   I think that if her antithrombin III level is okay, then there is no  need for her to be on Coumadin.  I think that aspirin, Plavix, or  Aggrenox would be appropriate for her.  I would make sure that her  endoscopy does not show gastritis that would be exacerbated by the  aspirin, however.   I will also be compulsive and do Doppler of her legs.  From history  she does not have any nonocclusive thrombus in her legs.   Again, I think Ms. Greenly is not hypercoagulable.  I just do not have the  sense that she needs to be on long-term anticoagulation with Coumadin.  This certainly would make life easier for her.  Also, would I think  prevent her from having problems with heavy menstrual cycles.   I spoke to Ms. Hesser and her husband.  I explained to them my  recommendations.  I explained to her why I was dubious about her having  antithrombin III deficiency.   We will follow her along.  We will certainly provide more input as we  get some of her test back.      Rose Phi. Myna Hidalgo, M.D.  Electronically Signed     PRE/MEDQ  D:  12/16/2007  T:  12/16/2007  Job:  [id number]   cc:   Jesse Sans. Daleen Squibb, MD, [facility]  Rachael Fee, MD

## 2010-07-30 NOTE — Assessment & Plan Note (Signed)
[facility]                            CARDIOLOGY OFFICE NOTE   NAME:WHITEBianco, Cange                      MRN:          [id number]  DATE:07/13/2007                            DOB:          [birth year]    Ms. Gaubert returns today after being discharged from the hospital on  June 30, 2007.  She had a non-Q-wave infarction, presenting with chest  discomfort.   She had a previous anterior wall infarct with ventricular fibrillation  arrest and PCI of the LAD in 2000.  She had near complete recovery of LV  function.   She apparently had a reported history of antithrombin III deficiency,  though her labs were negative on admission, but Dr. Myna Hidalgo felt that  with a previous history of her coronary thrombosis, not to mention a  previous mild stroke or transient ischemic attack in 2007, she needs to  be on lifelong Coumadin.  Please see the discharge summary for details.   She was also found to be mildly hyperlipidemic and had plaque by IVUS.  We placed her on a statin.  She will be due blood work in 4 weeks.   She had a bare metal stent and PTCA of the LAD.  She had nonobstructive  disease in her right coronary artery and her circumflex.  She needs to  be on Plavix for 1 month and then on Coumadin and aspirin forever.   She denies any angina or any symptoms whatsoever.  She is walking 30  minutes every day.  She seems very compliant.  She has a 40-year-old as  well as a 40 year old.   CURRENT MEDICATIONS:  1. Plavix 75 mg a day.  2. Coumadin.  3. Aspirin 81 mg a day.  4. Carvedilol 3.125 b.i.d.  5. Zocor 20 mg p.o. at bedtime.   PHYSICAL EXAMINATION:  VITAL SIGNS:  Her blood pressure is 130/79, her  pulse is 83 and regular, weight is 212.  HEENT:  Unchanged.  NECK:  Carotid upstrokes are equal bilaterally, without bruits.  No JVD.  Thyroid is not enlarged.  Trachea is midline.  LUNGS:  Clear.  HEART:  Reveals a regular rate and rhythm, poorly  appreciated PMI.  ABDOMEN:  Soft.  Good bowel sounds.  No midline bruit.  EXTREMITIES:  Reveal no cyanosis, clubbing, or edema.  Pulses are  intact.  NEUROLOGIC:  Intact.   Electrocardiogram shows normal sinus rhythm, with poor R wave  progression across the anterior precordium.   Please note that her EF was 65% at the time of her catheterization.   ASSESSMENT AND PLAN:  Ms. Strand is doing well.   We have made the following recommendations:  1. Discontinue Plavix after 1 month.  2. Continue Coumadin, and she is followed in the [facility] Clinic.  3. Continue aspirin indefinitely.  4. Increase Carvedilol to 6.25 b.i.d.  5. Continue Zocor, with follow-up lipids and LFTs in 4 weeks.  6. See me back in 6 weeks for followup.     Thomas C. Daleen Squibb, MD, [facility]  Electronically Signed    TCW/MedQ  DD: 07/13/2007  DT:  07/13/2007  Job #: [id number]   cc:   Rose Phi. Myna Hidalgo, M.D.

## 2010-08-02 NOTE — Discharge Summary (Signed)
[place]  Patient:    Lindsey [name], Lindsey [name]                        MRN: [id number] Adm. Date:  06/04/00 Disc. Date: 06/05/00 Attending:  Doylene Canning. Ladona Ridgel, M.D. [facility] Dictator:   Gene Serpe, P.A. CC:         Lacretia Leigh. Quintella Reichert, M.D.   Referring Physician Discharge [facility]  PROCEDURES:  Adenosine Cardiolite June 05, 2000.  REASON FOR ADMISSION:  Patient is a 40 year old female status post previous anterior MI associated with ventricular fibrillation March 2000 - status post PCI 100% LAD, who presented with recurrent chest discomfort.  LABORATORY DATA:  Cardiac enzymes:  Elevated total CPK (peak 382) with negative MB fractions (x 3); negative troponin I x 3.   Normal CBC.  Normal metabolic profile.  Normal LFTs.  INR of 1.0.  Admission chest x-ray:  NAD.  HOSPITAL COURSE:  Following admission, patient ruled out for MI with negative serial cardiac enzymes.  Patients presentation was deemed atypical for angina pectoris.  Following the negative enzymes, recommendation was to proceed with a pharmacologic stress test.  Adenosine Cardiolite performed March 22 revealed no significant change since previous study April 2000:  Apical scar with mild peri-infarct ischemia, EF 51%.  The ejection fraction was improved from that of the previous study (EF 45%).  Recommendation was to proceed with GI evaluation if study were negative. Following discussion with the patient, recommendation is to have her return for cardiology follow-up in approximately two weeks on Protonix.  We will then set her up for a GI evaluation and consideration of EGD.  MEDICATIONS AT DISCHARGE: 1. Protonix 40 mg q.d. 2. Aspirin 81 mg q.d. 3. Toprol-XL 25 mg q.d. 4. Nitrostat spray as directed.  INSTRUCTIONS:  ACTIVITY:  Return to baseline level of activity as tolerated.  DIET:  Maintain low fat/cholesterol diet.  FOLLOW-UP:  Patient is instructed to call and schedule a follow-up  appointment with Dr. Dorris Carnes. [facility] clinic is approximately two weeks.  We will then schedule her for evaluation with the [facility].  DISCHARGE DIAGNOSES: 1. Nonischemic chest pain.    a. Negative serial cardiac enzymes.    b. Adenosine Cardiolite June 05, 2000:  No significant change from prior       study April 2000. 2. Coronary artery disease.    a. Status post anterior myocardial infarction/percutaneous coronary       intervention left anterior descending artery March 2000.    b. Associated with ventricular fibrillation. 3. History of gastritis. 4. Obesity. DD:  06/05/00 TD:  06/06/00 Job: [id number]

## 2010-08-02 NOTE — Discharge Summary (Signed)
Lindsey [name] NO.:  [id number]   MEDICAL RECORD NO.:  [id number]          PATIENT TYPE:  INP   LOCATION:  3003                         FACILITY:  [facility]   PHYSICIAN:  Marlan Palau, M.D.  DATE OF BIRTH:  [birth year]   DATE OF ADMISSION:  01/03/2006  DATE OF DISCHARGE:  01/04/2006                                 DISCHARGE SUMMARY   ADMISSION DIAGNOSES:  1. Transient episode of aphasia, speech arrest.  2. Antithrombin III deficiency.   DISCHARGE DIAGNOSES:  1. Possible transient ischemic attack with speech arrest.  2. Antithrombin III deficiency.   PROCEDURES DURING THIS ADMISSION:  1. CT of the head.  2. MRI of the brain.  3. MRI angiogram with intracranial and extracranial vessels.   COMPLICATIONS OF ABOVE PROCEDURES:  None.   HISTORY OF PRESENT ILLNESS:  Lindsey [name] is a 40 year old left-handed  black female born 18-Jul-1970, with a history of an antithrombin III  deficiency in the past.  The patient sustained an MI previously and has been  on Coumadin, followed by a hematologist in the [place], [place] area.  The patient has been taken off of Coumadin and placed on aspirin 325 mg  daily.  The patient has done well with this.  On the day of admission,  however, the patient was riding in the car with her mother and had sudden  onset of speech arrest lasting 3-5 minutes with full resolution.  The  patient reported some tightness in the head with that but no true numbness  or weakness of the arms or legs, otherwise.  The patient did not black-out,  did complain of some dizziness.  The patient came to the emergency room, and  a CT scan of the head was unremarkable.  The patient was admitted for  further evaluation.   PAST MEDICAL HISTORY:  1. Transient speech arrest, aphasia.  2. Antithrombin III deficiency.  3. History of an MI and status post stent procedure.  4. Obesity.  5. History of gastritis in the past.  6. Bilateral tubal  ligation.   MEDICATIONS PRIOR TO ADMISSION:  Aspirin 325 mg daily.  The patient does not  smoke or drink, has no known allergies.   Please refer to history and physical dictation for her social history,  family history, review of systems, physical examination.   Laboratory values notable for Shiveley count 5.9, hemoglobin 11.2, hematocrit  34.3, MCV of 78.8, platelets of 300, sodium of 141, potassium 3.7, chloride  of 107, CO2 24, glucose 104, BUN of 3, creatinine 0.9, total bili 0.3, alk  phosphatase 91, SGOT of 19, SGPT of 13, total protein 7, albumin 3.4,  calcium 9.3.  Urine drug screen negative.  Cholesterol panel with total  cholesterol 178, triglycerides 118, HDL 58, LDL of 96, VLDL 24.   HOSPITAL COURSE:  The patient was admitted to [facility] for  evaluation of her episode of speech arrest.  The patient gives a history of  antithrombin III deficiency.  The patient was given IV fluids and started on  aspirin therapy 325 mg daily.  The patient underwent CT scan of the head  through the emergency room that was unremarkable.  The patient was set up  for an MRI scan of the brain that was done and by my review appears to be  normal.  MRI angiogram with intracranial and extracranial vessels by my  review appear to be unremarkable.  The patient has plans for a T  echocardiogram.  The patient is completely back to normal at this point, and  we plan on discharging the patient to home taking Aggrenox 1 tablet daily  for 2 weeks and go to 1 full tablet twice daily.  The patient will be on  aspirin 81 mg a day for 2 weeks and then discontinue.  The patient complains  of problems with urinary frequency, urgency, burning when she passes her  urine.  The patient cannot provide urinalysis, but the patient will be  treated for presumed urinary tract infection taking Bactrim DS 1 tab twice a  day for the next 5 days.  The patient will have no strenuous physical  activity in the next 4-6  weeks, will be out of work until January 12, 2006.  The patient is from [place], [place] area but claims that she has moved  to the [place] area and will not be returning to [place].  The patient  will be set up for an outpatient 2-D echocardiogram, bubble study with a  transcranial Doppler, EEG study as an outpatient.  The patient will follow  up with [facility] in 4-6 weeks, will need a follow-up  with the results of the hypercoagulable panel.  If antithrombin III level  again is low, we will consider consultation through a local hematologist.  The patient will remain on Aggrenox for now.  The patient is to contact our  office immediately if any further problems arise.  At the time of discharge,  the patient is bright, alert, cooperative, normal strength, sensation,  vision, fully ambulatory.      Marlan Palau, M.D.  Electronically Signed     CKW/MEDQ  D:  01/04/2006  T:  01/05/2006  Job:  [id number]

## 2010-08-02 NOTE — H&P (Signed)
NAMEASHNA, DOROUGH NO.:  [id number]   MEDICAL RECORD NO.:  [id number]          PATIENT TYPE:  INP   LOCATION:  3003                         FACILITY:  [facility]   PHYSICIAN:  Marlan Palau, M.D.  DATE OF BIRTH:  12-31-1970   DATE OF ADMISSION:  01/03/2006  DATE OF DISCHARGE:                                HISTORY & PHYSICAL   NEUROLOGY ADMISSION NOTE:   HISTORY OF PRESENT ILLNESS:  Lindsey [name] is a 40 year old left-handed  black female born on 06/17/1970, with a history of antithrombin III  deficiency, followed in [place], [place], where she currently lives.  This patient has in the past been on Coumadin after she suffered an MI  complicated with VFib.  The patient has required two stents placed.  The  patient has subsequently been converted to aspirin.  She was up visiting in  this area, planning on moving to [place].  Patient was on a call with her  mother today and had an event of speech arrest where she was totally unable  to talk or make a sound for about 3-5 minutes.  Patient experienced a  pressure sensation in her head at the same time.  Patient had resolution of  the speech but the pressure sensation is not completely resolved.  Patient  had no focal numbness or weakness in her face, arms, or legs, had no visual  field disturbance.  Patient felt slightly dizzy and had increased heart rate  and felt generally weak after the event.  The event occurred around 8 p.m.  tonight.  Patient denies any prior history of migraine or any similar events  as described above.  Patient has been brought to the hospital at this point  for further evaluation.   PAST MEDICAL HISTORY:  Significant for:  1. History of antithrombin III deficiency.  2. History of MI status post stenting procedure.  3. Bilateral tubal ligation.  4. Obesity.  5. History of gastritis.   MEDICATIONS:  At this time include aspirin 325 mg daily.   ALLERGIES:  Patient, again, has no  known allergies.   SOCIAL HISTORY:  Patient does not smoke or drink.  Denies use of illicit  drugs.  Patient is married, lives in the [place], [place] area.  Works  for [employer] [place].  Again, is married, has two children who are both alive  and well.   FAMILY MEDICAL HISTORY:  The mother is alive with history of hypertension,  DVT in the past.  Father is alive and well.  Patient has one brother who  died at age 58 from a cardiac rhythm abnormality.   REVIEW OF SYSTEMS:  Noted for no recent fevers, chills.  Patient denies  history of headache.  Denies neck pain, shortness of breath.  Did have  increased heart rate today after the event started.  Denies nausea,  vomiting, troubles controlling the bowels or bladder.  Denies any blackout  episodes.  Did have some dizziness today.   PHYSICAL EXAMINATION:  VITALS:  Blood pressure is 142/83.  Heart rate is 78,  respiratory rate 20,  temperature afebrile.  GENERAL:  This patient is a minimally to moderately obese black female who  is alert and cooperative at the time of examination.  HEENT EXAMINATION:  Head is atraumatic.  Eyes:  Pupils are equal, round, and  reactive to light. Disks are flat bilaterally.  NECK:  Supple.  No carotid bruits noted.  RESPIRATORY EXAMINATION:  Clear.  CARDIOVASCULAR EXAMINATION:  A regular rate and rhythm.  No obvious murmurs  or rubs noted.  ABDOMEN:  Reveals positive bowel sounds.  No organomegaly or tenderness  noted.  EXTREMITIES:  Without significant edema.  NEUROLOGIC EXAM:  Cranial nerves as above.  Facial symmetry is present.  Patient has good sensation in the face with pin prick, soft touch  bilaterally.  Has good strength to facial muscles, with head turn the head,  shoulder shrug bilaterally.  Speech is well enunciated, not aphasic.  Motor  testing reveals 5/5 strength in all fours.  Good symmetric motor tones  noted.  Sensory testing is intact to pin prick, soft touch, vibratory  sensation  throughout.  No evidence of extinction is noted.  Patient has good  finger-nose-finger, heel-to-shin.  Gait was not tested.  No drift was seen  in arms or legs.  Deep tendon reflexes symmetric, normal.  Toes downgoing  bilaterally.   LABORATORY VALUES:  Notable for a hemoglobin of 13.3, hematocrit of 39.  Sodium 141, potassium 3.7, chloride of 108, glucose of 101, BUN of less than  3, creatinine 1.   IMPRESSION:  1. Transient episode of speech arrest, rule out transient ischemic attack,      seizure, or migraine.  2. Antithrombin III deficiency.   This patient does have risk factors for a hypercoagulable state and possible  stroke.  Patient has a prior history of coronary artery disease and  myocardial infarction.  Will admit this patient for further workup at this  point.   PLAN:  1. Admission to [facility].  2. MRI of the brain.  3. MRI/angiogram of the intracranial and extracranial vessels.  4. A 2-D echocardiogram.  5. Hypercoagulable state workup.  6. Consider hematology consultation.  Question whether patient may require      use of Coumadin again.  7. EEG studies.  8. Urine drug screen.   Will follow patient's clinical course while in-house.   NIH stroke scale at this point was zero.  Patient was not felt to be a TPA  candidate due to minimal deficit at this point.      Marlan Palau, M.D.  Electronically Signed    CKW/MEDQ  D:  01/03/2006  T:  01/04/2006  Job:  [id number]   cc:   [facility]

## 2010-08-02 NOTE — Procedures (Signed)
CLINICAL HISTORY:  The patient is a 40 year old who was evaluated for onset  of slurred speech and generalized weakness.  This study is being done to  look for the presence of underlying seizure disorder, 784.3, 780.39.   PROCEDURE:  The tracing is carried out on a 37 digital Cadwell recorder  reformatted into 16 channel montages with one devoted to EKG.  The patient  was awake and drowsy during the recording.  The International 10/20 system  lead placement used.   DESCRIPTION OF FINDINGS:  Dominant frequency is a 9 Hz 15 microvolt alpha  range activity that is broadly distributed with frontally predominant under  10 microvolt beta range activity.   The patient becomes drowsy with mixed frequency diffuse theta and upper  delta range activity.  Light natural sleep was not achieved.  Photic  stimulation was carried out and induced a driving response between 9 and 15  Hz.  Hyperventilation caused no change.   EKG showed regular sinus rhythm with ventricular response of 66 beats per  minute.   IMPRESSION:  Normal record with the patient awake and drowsy.      Deanna Artis. Sharene Skeans, M.D.  Electronically Signed     ZOX:WRUE  D:  01/05/2006 15:41:19  T:  01/06/2006 14:49:12  Job #:  [id number]   cc:   Marlan Palau, M.D.  Fax: [phone]

## 2010-09-25 ENCOUNTER — Emergency Department (HOSPITAL_COMMUNITY)
Admission: EM | Admit: 2010-09-25 | Discharge: 2010-09-25 | Disposition: A | Discharge status: Home / Self Care | Payer: Medicaid Other | Attending: Emergency Medicine | Admitting: Emergency Medicine

## 2010-09-25 DIAGNOSIS — B9689 Other specified bacterial agents as the cause of diseases classified elsewhere: Secondary | ICD-10-CM | POA: Insufficient documentation

## 2010-09-25 DIAGNOSIS — A499 Bacterial infection, unspecified: Secondary | ICD-10-CM | POA: Insufficient documentation

## 2010-09-25 DIAGNOSIS — Z79899 Other long term (current) drug therapy: Secondary | ICD-10-CM | POA: Insufficient documentation

## 2010-09-25 DIAGNOSIS — Z7982 Long term (current) use of aspirin: Secondary | ICD-10-CM | POA: Insufficient documentation

## 2010-09-25 DIAGNOSIS — R300 Dysuria: Secondary | ICD-10-CM | POA: Insufficient documentation

## 2010-09-25 DIAGNOSIS — N760 Acute vaginitis: Secondary | ICD-10-CM | POA: Insufficient documentation

## 2010-09-25 DIAGNOSIS — Z7902 Long term (current) use of antithrombotics/antiplatelets: Secondary | ICD-10-CM | POA: Insufficient documentation

## 2010-09-25 DIAGNOSIS — D6859 Other primary thrombophilia: Secondary | ICD-10-CM | POA: Insufficient documentation

## 2010-09-25 LAB — URINALYSIS, ROUTINE W REFLEX MICROSCOPIC
Bilirubin Urine: NEGATIVE
Glucose, UA: NEGATIVE mg/dL
Hgb urine dipstick: NEGATIVE
Specific Gravity, Urine: 1.014 (ref 1.005–1.030)
pH: 6 (ref 5.0–8.0)

## 2010-09-25 LAB — URINE MICROSCOPIC-ADD ON

## 2010-09-26 LAB — URINE CULTURE
Colony Count: NO GROWTH
Culture  Setup Time: 201207112309
Culture: NO GROWTH

## 2010-09-26 LAB — GC/CHLAMYDIA PROBE AMP, GENITAL
Chlamydia, DNA Probe: NEGATIVE
GC Probe Amp, Genital: NEGATIVE

## 2010-09-26 LAB — WET PREP, GENITAL: Yeast Wet Prep HPF POC: NONE SEEN

## 2010-09-26 LAB — POCT PREGNANCY, URINE: Preg Test, Ur: NEGATIVE

## 2010-12-10 LAB — CBC
HCT: 31.9 — ABNORMAL LOW
HCT: 32.9 — ABNORMAL LOW
HCT: 32.9 — ABNORMAL LOW
HCT: 33.4 — ABNORMAL LOW
Hemoglobin: 10.6 — ABNORMAL LOW
Hemoglobin: 10.8 — ABNORMAL LOW
Hemoglobin: 10.9 — ABNORMAL LOW
Hemoglobin: 11 — ABNORMAL LOW
Hemoglobin: 11.4 — ABNORMAL LOW
MCHC: 32.7
MCHC: 33.2
MCV: 86.3
Platelets: 212
Platelets: 218
RBC: 3.83 — ABNORMAL LOW
RBC: 3.88
RDW: 14.3
RDW: 14.5
RDW: 14.6
RDW: 14.7
WBC: 7.5

## 2010-12-10 LAB — LIPID PANEL
Cholesterol: 156
LDL Cholesterol: 101 — ABNORMAL HIGH
Total CHOL/HDL Ratio: 3.7

## 2010-12-10 LAB — CARDIAC PANEL(CRET KIN+CKTOT+MB+TROPI)
CK, MB: 8.2 — ABNORMAL HIGH
Relative Index: 5 — ABNORMAL HIGH
Troponin I: 1.74

## 2010-12-10 LAB — DIFFERENTIAL
Basophils Absolute: 0
Basophils Relative: 1
Eosinophils Absolute: 0.3
Eosinophils Relative: 3
Monocytes Absolute: 0.7

## 2010-12-10 LAB — BETA-2-GLYCOPROTEIN I ABS, IGG/M/A
Beta-2 Glyco I IgG: <4 U/mL (ref <20)
Beta-2-Glycoprotein I IgA: <4 U/mL (ref <10)
Beta-2-Glycoprotein I IgM: <4 U/mL (ref <10)

## 2010-12-10 LAB — CK TOTAL AND CKMB (NOT AT ARMC)
CK, MB: 3.3
Relative Index: 2.9 — ABNORMAL HIGH
Total CK: 115
Total CK: 87

## 2010-12-10 LAB — PROTIME-INR
INR: 0.9
INR: 0.9
INR: 0.9
Prothrombin Time: 13.1

## 2010-12-10 LAB — RAPID URINE DRUG SCREEN, HOSP PERFORMED
Amphetamines: NOT DETECTED
Barbiturates: NOT DETECTED
Benzodiazepines: NOT DETECTED
Cocaine: NOT DETECTED
Opiates: POSITIVE — AB
Tetrahydrocannabinol: NOT DETECTED

## 2010-12-10 LAB — POCT CARDIAC MARKERS
CKMB, poc: <1 — ABNORMAL LOW
Myoglobin, poc: 47
Operator id: 284251

## 2010-12-10 LAB — BASIC METABOLIC PANEL
BUN: 11
CO2: 22
CO2: 25
CO2: 26
Calcium: 8.5
Chloride: 106
Chloride: 109
GFR calc Af Amer: >60
GFR calc non Af Amer: >60
Glucose, Bld: 93
Glucose, Bld: 96
Potassium: 3.7
Potassium: 3.8
Potassium: 3.8
Sodium: 136
Sodium: 137
Sodium: 139

## 2010-12-10 LAB — ANTITHROMBIN III
AntiThromb III Func: 137 — ABNORMAL HIGH (ref 76–126)
AntiThromb III Func: 85

## 2010-12-10 LAB — LUPUS ANTICOAGULANT PANEL: DRVVT: 44.7 (ref 36.1–47.0)

## 2010-12-10 LAB — PROTHROMBIN GENE MUTATION

## 2010-12-10 LAB — CARDIOLIPIN ANTIBODIES, IGG, IGM, IGA
Anticardiolipin IgA: 56 (ref <13)
Anticardiolipin IgM: <7 — ABNORMAL LOW (ref <10)

## 2010-12-10 LAB — PROTEIN S, TOTAL: Protein S Ag, Total: 96 % (ref 70–140)

## 2010-12-10 LAB — PROTEIN C, TOTAL: Protein C, Total: 55 % — ABNORMAL LOW (ref 70–140)

## 2010-12-16 LAB — CBC
HCT: 27.1 — ABNORMAL LOW
Hemoglobin: 8.5 — ABNORMAL LOW
Hemoglobin: 8.5 — ABNORMAL LOW
MCHC: 30.4
MCHC: 30.8
MCHC: 30.8
MCHC: 31.1
MCV: 62.4 — ABNORMAL LOW
MCV: 63.3 — ABNORMAL LOW
MCV: 70.4 — ABNORMAL LOW
MCV: 70.7 — ABNORMAL LOW
Platelets: 246
Platelets: 281
Platelets: 344
Platelets: 272
Platelets: 290
RBC: 4.3
RBC: 4.81
RDW: 23.9 — ABNORMAL HIGH
RDW: 24.1 — ABNORMAL HIGH
RDW: 24.4 — ABNORMAL HIGH
RDW: 29 — ABNORMAL HIGH
WBC: 6.1
WBC: 7
WBC: 7.3
WBC: 11.6 — ABNORMAL HIGH
WBC: 9.5

## 2010-12-16 LAB — POCT CARDIAC MARKERS
CKMB, poc: 1.2
Myoglobin, poc: 86.6
Troponin i, poc: <0.05

## 2010-12-16 LAB — BASIC METABOLIC PANEL
BUN: 11
CO2: 24
CO2: 23
Calcium: 9.4
Chloride: 109
Chloride: 105
Creatinine, Ser: 0.76
Creatinine, Ser: 0.74
Creatinine, Ser: 0.8
Creatinine, Ser: 0.81
GFR calc Af Amer: >60
GFR calc Af Amer: >60
GFR calc Af Amer: >60
GFR calc non Af Amer: >60
Glucose, Bld: 90
Potassium: 3.9

## 2010-12-16 LAB — PROTIME-INR
INR: 2 — ABNORMAL HIGH
INR: 3 — ABNORMAL HIGH
INR: 3.3 — ABNORMAL HIGH
INR: 1
INR: 1
Prothrombin Time: 23.9 — ABNORMAL HIGH
Prothrombin Time: 33.2 — ABNORMAL HIGH
Prothrombin Time: 36.5 — ABNORMAL HIGH
Prothrombin Time: 13.9
Prothrombin Time: 13.9

## 2010-12-16 LAB — DIFFERENTIAL
Band Neutrophils: 0
Basophils Relative: 1
Eosinophils Relative: 1
Lymphocytes Relative: 17
Monocytes Relative: 11

## 2010-12-16 LAB — POCT I-STAT, CHEM 8
Calcium, Ion: 0.91 — ABNORMAL LOW
Calcium, Ion: 0.97 — ABNORMAL LOW
Chloride: 112
Chloride: 112
HCT: 30 — ABNORMAL LOW
HCT: 32 — ABNORMAL LOW
Potassium: 4.3
Potassium: 6.9
Sodium: 137

## 2010-12-16 LAB — COMPREHENSIVE METABOLIC PANEL
AST: 18
Albumin: 3.5
Calcium: 8.7
Creatinine, Ser: 0.83
GFR calc Af Amer: >60
Sodium: 142

## 2010-12-16 LAB — TYPE AND SCREEN: Antibody Screen: NEGATIVE

## 2010-12-16 LAB — LIPID PANEL
HDL: 48
Total CHOL/HDL Ratio: 3.3
Triglycerides: 80
VLDL: 16

## 2010-12-16 LAB — CARDIAC PANEL(CRET KIN+CKTOT+MB+TROPI)
CK, MB: 0.8
CK, MB: 0.9
Relative Index: 0.5
Relative Index: 0.5
Total CK: 157
Total CK: 177
Troponin I: 0.01
Troponin I: <0.01

## 2010-12-16 LAB — TISSUE TRANSGLUTAMINASE, IGA: Tissue Transglutaminase Ab, IgA: 0.5 U/mL (ref <7)

## 2010-12-16 LAB — IRON AND TIBC: Iron: 23 — ABNORMAL LOW

## 2010-12-16 LAB — ABO/RH: ABO/RH(D): B POS

## 2010-12-16 LAB — ANTITHROMBIN III: AntiThromb III Func: 128 — ABNORMAL HIGH (ref 76–126)

## 2010-12-16 LAB — CROSSMATCH: ABO/RH(D): B POS

## 2010-12-18 LAB — CBC
HCT: 27.8 — ABNORMAL LOW
MCV: 64.3 — ABNORMAL LOW
Platelets: 326
RDW: 23.5 — ABNORMAL HIGH

## 2010-12-18 LAB — DIFFERENTIAL
Basophils Absolute: 0
Eosinophils Absolute: 0.1
Lymphs Abs: 1.7
Neutro Abs: 4.4

## 2010-12-18 LAB — POCT I-STAT, CHEM 8
Calcium, Ion: 1.1 — ABNORMAL LOW
HCT: 30 — ABNORMAL LOW
Hemoglobin: 10.2 — ABNORMAL LOW
Sodium: 138
TCO2: 23

## 2010-12-23 ENCOUNTER — Emergency Department (HOSPITAL_COMMUNITY): Payer: Medicaid Other

## 2010-12-23 ENCOUNTER — Emergency Department (HOSPITAL_COMMUNITY)
Admission: EM | Admit: 2010-12-23 | Discharge: 2010-12-24 | Disposition: A | Discharge status: Home / Self Care | Payer: Medicaid Other | Attending: Emergency Medicine | Admitting: Emergency Medicine

## 2010-12-23 DIAGNOSIS — K2970 Gastritis, unspecified, without bleeding: Secondary | ICD-10-CM | POA: Insufficient documentation

## 2010-12-23 DIAGNOSIS — Z7982 Long term (current) use of aspirin: Secondary | ICD-10-CM | POA: Insufficient documentation

## 2010-12-23 DIAGNOSIS — D6859 Other primary thrombophilia: Secondary | ICD-10-CM | POA: Insufficient documentation

## 2010-12-23 DIAGNOSIS — Z8673 Personal history of transient ischemic attack (TIA), and cerebral infarction without residual deficits: Secondary | ICD-10-CM | POA: Insufficient documentation

## 2010-12-23 DIAGNOSIS — R109 Unspecified abdominal pain: Secondary | ICD-10-CM | POA: Insufficient documentation

## 2010-12-23 DIAGNOSIS — R10819 Abdominal tenderness, unspecified site: Secondary | ICD-10-CM | POA: Insufficient documentation

## 2010-12-23 DIAGNOSIS — O99891 Other specified diseases and conditions complicating pregnancy: Secondary | ICD-10-CM | POA: Insufficient documentation

## 2010-12-23 DIAGNOSIS — Z79899 Other long term (current) drug therapy: Secondary | ICD-10-CM | POA: Insufficient documentation

## 2010-12-23 DIAGNOSIS — I252 Old myocardial infarction: Secondary | ICD-10-CM | POA: Insufficient documentation

## 2010-12-23 DIAGNOSIS — K859 Acute pancreatitis without necrosis or infection, unspecified: Secondary | ICD-10-CM | POA: Insufficient documentation

## 2010-12-23 LAB — COMPREHENSIVE METABOLIC PANEL
AST: 154 U/L — ABNORMAL HIGH (ref 0–37)
Albumin: 3.9 g/dL (ref 3.5–5.2)
Alkaline Phosphatase: 91 U/L (ref 39–117)
BUN: 6 mg/dL (ref 6–23)
CO2: 22 mEq/L (ref 19–32)
Chloride: 101 mEq/L (ref 96–112)
Potassium: 4.2 mEq/L (ref 3.5–5.1)
Total Bilirubin: 0.5 mg/dL (ref 0.3–1.2)

## 2010-12-23 LAB — CBC
HCT: 38 % (ref 36.0–46.0)
Hemoglobin: 13.2 g/dL (ref 12.0–15.0)
MCH: 30.6 pg (ref 26.0–34.0)
RBC: 4.32 MIL/uL (ref 3.87–5.11)

## 2010-12-23 LAB — URINALYSIS, ROUTINE W REFLEX MICROSCOPIC
Nitrite: NEGATIVE
Specific Gravity, Urine: 1.015 (ref 1.005–1.030)
Urobilinogen, UA: 0.2 mg/dL (ref 0.0–1.0)

## 2010-12-23 LAB — DIFFERENTIAL
Basophils Absolute: 0.1 10*3/uL (ref 0.0–0.1)
Basophils Relative: 1 % (ref 0–1)
Blasts: 0 %
Myelocytes: 0 %
Neutro Abs: 4.9 10*3/uL (ref 1.7–7.7)
Neutrophils Relative %: 60 % (ref 43–77)
Promyelocytes Absolute: 0 %
nRBC: 0 /100 WBC

## 2010-12-23 LAB — POCT I-STAT TROPONIN I: Troponin i, poc: 0 ng/mL (ref 0.00–0.08)

## 2010-12-23 LAB — APTT: aPTT: 29 seconds (ref 24–37)

## 2010-12-23 LAB — POCT PREGNANCY, URINE: Preg Test, Ur: POSITIVE

## 2010-12-23 LAB — LIPASE, BLOOD: Lipase: 140 U/L — ABNORMAL HIGH (ref 11–59)

## 2010-12-24 LAB — RAPID URINE DRUG SCREEN, HOSP PERFORMED
Cocaine: NOT DETECTED
Opiates: NOT DETECTED

## 2010-12-26 ENCOUNTER — Inpatient Hospital Stay (HOSPITAL_COMMUNITY)
Admission: EM | Admit: 2010-12-26 | Discharge: 2010-12-29 | DRG: 440 | Disposition: A | Discharge status: Home / Self Care | Payer: Medicaid Other | Source: Ambulatory Visit | Attending: Internal Medicine | Admitting: Internal Medicine

## 2010-12-26 ENCOUNTER — Encounter (HOSPITAL_COMMUNITY): Payer: Self-pay | Admitting: Radiology

## 2010-12-26 ENCOUNTER — Emergency Department (HOSPITAL_COMMUNITY): Payer: Medicaid Other

## 2010-12-26 DIAGNOSIS — I2510 Atherosclerotic heart disease of native coronary artery without angina pectoris: Secondary | ICD-10-CM | POA: Diagnosis present

## 2010-12-26 DIAGNOSIS — E785 Hyperlipidemia, unspecified: Secondary | ICD-10-CM | POA: Diagnosis present

## 2010-12-26 DIAGNOSIS — Z7902 Long term (current) use of antithrombotics/antiplatelets: Secondary | ICD-10-CM

## 2010-12-26 DIAGNOSIS — R824 Acetonuria: Secondary | ICD-10-CM | POA: Diagnosis present

## 2010-12-26 DIAGNOSIS — Z23 Encounter for immunization: Secondary | ICD-10-CM

## 2010-12-26 DIAGNOSIS — Z9861 Coronary angioplasty status: Secondary | ICD-10-CM

## 2010-12-26 DIAGNOSIS — K859 Acute pancreatitis without necrosis or infection, unspecified: Principal | ICD-10-CM | POA: Diagnosis present

## 2010-12-26 DIAGNOSIS — Z8673 Personal history of transient ischemic attack (TIA), and cerebral infarction without residual deficits: Secondary | ICD-10-CM

## 2010-12-26 DIAGNOSIS — I252 Old myocardial infarction: Secondary | ICD-10-CM

## 2010-12-26 DIAGNOSIS — Z7982 Long term (current) use of aspirin: Secondary | ICD-10-CM

## 2010-12-26 DIAGNOSIS — K219 Gastro-esophageal reflux disease without esophagitis: Secondary | ICD-10-CM | POA: Diagnosis present

## 2010-12-26 DIAGNOSIS — E860 Dehydration: Secondary | ICD-10-CM | POA: Diagnosis present

## 2010-12-26 DIAGNOSIS — Z79899 Other long term (current) drug therapy: Secondary | ICD-10-CM

## 2010-12-26 LAB — COMPREHENSIVE METABOLIC PANEL
ALT: 39 U/L — ABNORMAL HIGH (ref 0–35)
AST: 26 U/L (ref 0–37)
Albumin: 3.3 g/dL — ABNORMAL LOW (ref 3.5–5.2)
Alkaline Phosphatase: 81 U/L (ref 39–117)
BUN: 5 mg/dL — ABNORMAL LOW (ref 6–23)
Chloride: 102 mEq/L (ref 96–112)
Potassium: 3.8 mEq/L (ref 3.5–5.1)
Sodium: 138 mEq/L (ref 135–145)
Total Bilirubin: 0.8 mg/dL (ref 0.3–1.2)

## 2010-12-26 LAB — URINALYSIS, ROUTINE W REFLEX MICROSCOPIC
Glucose, UA: NEGATIVE mg/dL
Leukocytes, UA: NEGATIVE
pH: 7 (ref 5.0–8.0)

## 2010-12-26 LAB — CBC
MCV: 89.5 fL (ref 78.0–100.0)
Platelets: 159 10*3/uL (ref 150–400)
RBC: 3.99 MIL/uL (ref 3.87–5.11)
WBC: 8.4 10*3/uL (ref 4.0–10.5)

## 2010-12-26 LAB — LIPASE, BLOOD: Lipase: 630 U/L — ABNORMAL HIGH (ref 11–59)

## 2010-12-26 LAB — DIFFERENTIAL
Eosinophils Absolute: 0.3 10*3/uL (ref 0.0–0.7)
Lymphocytes Relative: 18 % (ref 12–46)
Lymphs Abs: 1.5 10*3/uL (ref 0.7–4.0)
Neutrophils Relative %: 70 % (ref 43–77)

## 2010-12-26 MED ORDER — IOHEXOL 300 MG/ML  SOLN
100.0000 mL | Freq: Once | INTRAMUSCULAR | Status: AC | PRN
  Administered 2010-12-26: 100 mL via INTRAVENOUS

## 2010-12-27 LAB — LIPID PANEL
Cholesterol: 173 mg/dL (ref 0–200)
LDL Cholesterol: 70 mg/dL (ref 0–99)
Triglycerides: 96 mg/dL (ref <150)

## 2010-12-27 LAB — BASIC METABOLIC PANEL
Calcium: 8.6 mg/dL (ref 8.4–10.5)
GFR calc Af Amer: >90 mL/min (ref >90)
GFR calc non Af Amer: >90 mL/min (ref >90)
Glucose, Bld: 87 mg/dL (ref 70–99)
Sodium: 138 mEq/L (ref 135–145)

## 2010-12-27 LAB — CARDIAC PANEL(CRET KIN+CKTOT+MB+TROPI)
CK, MB: 1.8 ng/mL (ref 0.3–4.0)
CK, MB: 1.9 ng/mL (ref 0.3–4.0)
Relative Index: INVALID (ref 0.0–2.5)
Relative Index: INVALID (ref 0.0–2.5)
Total CK: 61 U/L (ref 7–177)
Troponin I: <0.3 ng/mL (ref <0.30)
Troponin I: <0.3 ng/mL (ref <0.30)

## 2010-12-27 LAB — LIPASE, BLOOD: Lipase: 517 U/L — ABNORMAL HIGH (ref 11–59)

## 2010-12-27 LAB — CBC
MCHC: 33.1 g/dL (ref 30.0–36.0)
Platelets: 144 10*3/uL — ABNORMAL LOW (ref 150–400)
RDW: 14.2 % (ref 11.5–15.5)

## 2010-12-27 LAB — AMYLASE: Amylase: 164 U/L — ABNORMAL HIGH (ref 0–105)

## 2010-12-28 LAB — BASIC METABOLIC PANEL
CO2: 25 mEq/L (ref 19–32)
Calcium: 8.6 mg/dL (ref 8.4–10.5)
Glucose, Bld: 79 mg/dL (ref 70–99)
Sodium: 139 mEq/L (ref 135–145)

## 2010-12-29 LAB — BASIC METABOLIC PANEL
CO2: 27 mEq/L (ref 19–32)
Calcium: 8.7 mg/dL (ref 8.4–10.5)
Glucose, Bld: 94 mg/dL (ref 70–99)
Potassium: 4.2 mEq/L (ref 3.5–5.1)
Sodium: 139 mEq/L (ref 135–145)

## 2011-01-07 NOTE — H&P (Signed)
Lindsey [name], Lindsey [name] NO.:  [id number]  MEDICAL RECORD NO.:  [id number]  LOCATION:  [facility]                         FACILITY:  [facility]  PHYSICIAN:  Lindsey Ranger, MD       DATE OF BIRTH:  05/19/1970  DATE OF ADMISSION:  12/26/2010 DATE OF DISCHARGE:                             HISTORY & PHYSICAL   CARDIOLOGIST:  Lindsey Fus C. Wall, MD, [facility]  CHIEF COMPLAINT:  Abdominal pain for last 1 week.  HISTORY OF PRESENT ILLNESS:  Lindsey [name] is a 40 year old African American female with history of MI in 2000, history of CVA antithrombin III deficiency, and hyperlipidemia presented to the [facility] Emergency Room with abdominal pain.  History was obtained from the patient who states that she has been having abdominal pain for the last 1 week.  The patient apparently was seen in the ED on December 23, 2010, and apparently diagnosed with pancreatitis, but discharged home.  The patient states that she drinks minimal amount of alcohol occasionally.  She describes the pain as epigastric initially, dull and achy in nature, which progressively worsened over the last 1 week.  She states that now the pain is also radiating to the right side and about 8/10 in intensity, intermittent, sharp in nature.  The pain is also associated with nausea and vomiting multiple times in the last 1 week with diarrhea as well. She denies any hematemesis, hematochezia, or melena.  She states that she has not been able to hold anything down in the last 1 week and is having loss of appetite due to pain as well.  She denies any fever or chills any chest pain or shortness of breath, any headaches or visual changes.  REVIEW OF SYSTEMS:  Pertinent positives are dictated above, otherwise negative.  PAST MEDICAL HISTORY: 1. Hyperlipidemia. 2. History of coronary artery disease.  The patient had ST-segment     elevation MI in 2000.  The patient also had PTCA and bare-metal     stent in 2009.  Subsequently, another  catheterization in February     2011 showing no obstructive disease.  An EF of 55%. 3. History of CVA. 4. History of antithrombin III deficiency.  PAST SURGICAL HISTORY: 1. PTCA and bare metal stent to LAD in 2009. 2. Tubal ligation.  SOCIAL HISTORY:  The patient that she has picked up smoking more recently and smokes about 3 cigarettes a day due to stress.  She drinks alcohol occasionally.  Denies any drug use.  Lives at home with her family, she is otherwise independent with all her ADLs.  ALLERGIES:  No known drug allergies.  MEDICATIONS:  Prior to admission, 1. Aspirin 325 mg p.o. daily. 2. Tylenol 500 mg 1-2 tabs every 6 hours as needed. 3. Simvastatin 20 mg p.o. daily. 4. Protonix 40 mg p.o. b.i.d. 5. Nitroglycerin sublingual 0.4 mg p.r.n. for chest pain. 6. Plavix 75 mg p.o. daily. 7. Coreg 12.5 mg p.o. b.i.d.  PHYSICAL EXAMINATION:  VITAL SIGNS:  Blood pressure 114/77, pulse rate of 83, respiratory rate 16, and temp 97.9 at the time of triage the blood pressure was 157/110. GENERAL:  The patient is alert, awake, and oriented.  Pleasant  and cooperative not in any acute distress. HEENT:  Anicteric sclerae.  Pink conjunctivae.  Pupils are reactive to light and accommodation.  EOMI. NECK:  Supple.  No lymphadenopathy.  No JVD. CVS:  S1, S2 clear.  Regular rate and rhythm. CHEST:  Clear to auscultation bilaterally. ABDOMEN:  Soft.  Epigastric and right upper quadrant tenderness to deep palpation.  No rebound or guarding.  Normal bowel sounds. EXTREMITIES:  No cyanosis, clubbing, or edema noted in upper or lower extremities bilaterally. NEUROLOGIC:  No focal neurological deficits noted.  Alert and oriented x3.  LABORATORY/DIAGNOSTIC DATA:  UA with 15 ketones, negative nitrites and leukocyte esterase.  CBC showed Cafaro count of 8.4, hemoglobin 11.9, hematocrit 35.7, and platelets 159.  A beta-HCG 1.  CMP; sodium 138, potassium 3.8, BUN 5, creatinine 0.5, ALT 39, albumin  3.3, and calcium 8.9.  Lipase elevated at 630.  RADIOLOGICAL DATA:  The patient recently had abdominal ultrasound on December 24, 2010, which was normal without any stones, sludge, or [name] thickening.  Normal CBD at 4.7 mm and echogenic liver consistent with fatty change and no focal lesion or biliary duct dilatation.  Pancreas was poorly visualized.  CT abdomen and pelvis with contrast today on December 26, 2010, shows peripancreatic fat stranding keeping with acute pancreatitis.  No loculated fluid collection to suggest abscess or pseudocyst.  Homogenous pancreas parenchymal enhancement.  Mild [name] thickening of the duodenum is likely reactive to the chest in process. No free intraperitoneal air, distended gallbladder.  No radiodense gallstones or pericholecystic fat stranding hepatic steatosis.  IMPRESSION AND PLAN:  [name] is a 40 year old female with history of coronary artery disease, dyslipidemia, history of cerebrovascular accident and antithrombin III deficiency presents with abdominal pain likely secondary to pancreatitis with elevated lipase of 630.  1. Acute pancreatitis, possibly could be from acalculous cholecystitis     versus secondary to alcohol use versus hyperlipidemia.  The patient     recently had an ultrasound of the abdomen done on December 24, 2010,     two days ago which did not show any gallstone sludge or [name]     thickening.  She does not appear to have any pericholecystic fat     stranding or acute cholecystitis or positive Murphy sign on CT     abdomen and pelvis.  The patient was strongly counseled to avoid     alcohol.  She will be admitted and placed on n.p.o., IV fluids, and     pain control.  If she has any recurrent episodes of pancreatitis,     she may need elective referal to General Surgery for     cholecystectomy.  As dictated above, the patient does not have any     acute cholecystitis on recent ultrasound 2 days ago and on CT     abdomen and  pelvis done today. 2. History of coronary artery disease.  The patient will be continued     on aspirin, Plavix, and Coreg. 3. Hyperlipidemia.  Obtain fasting lipid panel and continue     simvastatin. 4. Dehydration/ketonuria.  Continue the patient on IV fluids.  Keep     n.p.o. for now. 5. History of cerebrovascular accident.  Continue aspirin and Plavix. 6. History of gastroesophageal reflux disease.  Continue Protonix.     Prophylaxis, bilateral SCDs for DVT prophylaxis. 7. Questionable history of antithrombin III deficiency.  Continue     aspirin and Plavix is unclear because the patient had a  hypercoagulable panel in November 2011, which did not pick up any     antithrombin III deficiency. 8. Prophylaxis.  Bilateral SCDs.     Lindsey Ranger, MD     RR/MEDQ  D:  12/26/2010  T:  12/26/2010  Job:  [id number]  cc:   Thomas C. Daleen Squibb, MD, FACC Lovenia Kim, D.O. Pramod P. Pearlean Brownie, MD  Electronically Signed by Andres Labrum RAI  on 01/07/2011 01:37:06 PM

## 2011-01-07 NOTE — Discharge Summary (Signed)
Lindsey [name], Lindsey [name] NO.:  [id number]  MEDICAL RECORD NO.:  [id number]  LOCATION:  5503                         FACILITY:  [facility]  PHYSICIAN:  Thad Ranger, MD       DATE OF BIRTH:  [birth year]  DATE OF ADMISSION:  12/26/2010 DATE OF DISCHARGE:                        DISCHARGE SUMMARY - REFERRING   PRIMARY CARDIOLOGIST:  Maisie Fus C. Wall, MD, [facility]  FINAL DISCHARGE DIAGNOSES: 1. Acute pancreatitis, likely secondary to alcohol use. 2. Coronary artery disease. 3. Hyperlipidemia. 4. Dehydration with ketonuria, resolved. 5. History of cerebrovascular accident.  FINAL DISCHARGE MEDICATIONS: 1. Vicodin 5/325 mg 1-2 tablets every 6 hours as needed for pain. 2. Phenergan 12.5 mg p.o. every 6 hours as needed for nausea/vomiting. 3. Tylenol 500 mg OTC 1-2 tablets every 6 hours as needed for headache     and pain/fever. 4. Aspirin 325 mg 1 tablet daily. 5. Coreg 12.5 mg p.o. b.i.d. 6. Plavix 75 mg p.o. daily. 7. Protonix 40 mg p.o. b.i.d. 8. Simvastatin 20 mg daily. 9. Nitroglycerin sublingual 1 tablet p.o. every 5 minutes as needed     for chest pain.  BRIEF HISTORY OF PRESENT ILLNESS AT TIME OF ADMISSION:  Lindsey [name] is a 40 year old African American female with history of small MI in 2000, history of CVA, an hyperlipidemia who presented to [facility] Emergency Room with abdominal pain.  The patient apparently was seen in the ED on December 23, 2010, and was diagnosed with pancreatitis but discharged home.  The patient stated that she drinks a minimal amount of alcohol. Described the pain as epigastric initially, dull and achy which progressively worsened over the last 1 week prior to admission radiating to the right side.  She had nausea and vomiting multiple times in the last 1 week with diarrhea.  She was admitted for further workup.  Lipase was found to be 630 at the time of admission.  RADIOLOGICAL DATA:  CT of abdomen and pelvis with contrast  showed peripancreatic fat stranding is in keeping with acute pancreatitis.  No loculated fluid collection to suggest abscess or pseudocyst. Homogeneous pancreas parenchymal enhancement.  Mild wall thickening of the duodenum is likely reactive to adjacent process.  Distended gallbladder.  No radiodense gallstones or pericholecystic fat stranding. Hepatic steatosis.  The patient recently had a ultrasound of abdomen on December 24, 2010, which showed echogenic liver consistent with diffuse fatty change.  No evidence of acute hepatobiliary disease.  Gallbladder was normal without stones, sludge, or any wall thickening.  PERTINENT LAB DIAGNOSTIC DATA:  UA showed 15 ketones, otherwise no UTI. Beta hCG 1.  CBC at the time of admission showed hemoglobin of 11.9, hematocrit 35.7.  Lipase level 630 at the time of admission, currently 125 at the time of the discharge.  Cardiac panel was negative.  Lipid profile showed good control with cholesterol of 173, triglycerides 96, and LDL 70.  BRIEF HOSPITALIZATION COURSE:  Lindsey [name] is a 40 year old female who was admitted with acute pancreatitis. 1. Acute pancreatitis, improved.  The patient actually had a recent     ultrasound 2 days before the admission, which did not show any     gallstones, sludge, or any wall  thickening.  The patient had a CT     of abdomen and pelvis also done with contrast which did not show     any pericholecystic fat stranding or acute cholecystitis or any     positive Murphy sign on CT, however, it was positive for acute     pancreatitis.  Likely the patient's pancreatitis may be due to     alcohol use.  She was strongly counseled to avoid alcohol.  She was     admitted and placed on n.p.o. status, IV fluids, and pain control.     The patient at the time of the discharge is tolerating a regular     diet without any difficulty.  The patient was counseled also to     avoid alcohol and if she has any recurrent episodes of  pancreatitis     she may need elective referral to General Surgery or     Gastroenterology by her primary care physician. 2. History of coronary disease.  The patient remained stable on     aspirin, Plavix, and Coreg. 3. Hyperlipidemia.  Lipid panel showed a good control and the patient     was continued on simvastatin. 4. History of CVAs.  It remained stable.  Continued on aspirin and     Plavix.  The patient will be discharged home today.  DISCHARGE PHYSICAL EXAMINATION:  VITAL SIGNS:  At the time of the dictation, blood pressure 138/92, temperature 98.3, pulse 67, respirations 18, and O2 sats 97% on room air. GENERAL:  The patient is alert, awake, and oriented, not in any acute distress. HEENT:  Anicteric sclerae and clear conjunctivae.  Pupils are equal and reactive to light and accommodation.  EOMI. NECK:  Supple.  No lymphadenopathy.  No JVD. CARDIOVASCULAR:  S1 and S2 clear. CHEST:  Clear to auscultation bilaterally. ABDOMEN:  Soft, nontender, and nondistended.  Normal bowel sounds. EXTREMITIES:  No cyanosis, clubbing, or edema noted in upper or lower extremities.  DISCHARGE FOLLOWUP:  The patient wants to have [facility] and she will call tomorrow to set up an appointment.  DISCHARGE TIME:  35 minutes.     Thad Ranger, MD     RR/MEDQ  D:  12/29/2010  T:  12/29/2010  Job:  [id number]  cc:   [facility] Jesse Sans. Daleen Squibb, MD, [facility]  Electronically Signed by Andres Labrum Bluma Buresh  on 01/07/2011 01:37:03 PM

## 2011-06-05 ENCOUNTER — Encounter (HOSPITAL_COMMUNITY): Payer: Self-pay | Admitting: Emergency Medicine

## 2011-06-05 ENCOUNTER — Inpatient Hospital Stay (HOSPITAL_COMMUNITY)
Admission: EM | Admit: 2011-06-05 | Discharge: 2011-06-13 | DRG: 439 | Disposition: A | Discharge status: Home / Self Care | Payer: Self-pay | Source: Ambulatory Visit | Attending: Internal Medicine | Admitting: Internal Medicine

## 2011-06-05 DIAGNOSIS — K5900 Constipation, unspecified: Secondary | ICD-10-CM | POA: Diagnosis not present

## 2011-06-05 DIAGNOSIS — I252 Old myocardial infarction: Secondary | ICD-10-CM

## 2011-06-05 DIAGNOSIS — D6859 Other primary thrombophilia: Secondary | ICD-10-CM | POA: Diagnosis present

## 2011-06-05 DIAGNOSIS — E782 Mixed hyperlipidemia: Secondary | ICD-10-CM | POA: Diagnosis present

## 2011-06-05 DIAGNOSIS — Z9861 Coronary angioplasty status: Secondary | ICD-10-CM

## 2011-06-05 DIAGNOSIS — I2510 Atherosclerotic heart disease of native coronary artery without angina pectoris: Secondary | ICD-10-CM | POA: Diagnosis present

## 2011-06-05 DIAGNOSIS — I69998 Other sequelae following unspecified cerebrovascular disease: Secondary | ICD-10-CM

## 2011-06-05 DIAGNOSIS — I10 Essential (primary) hypertension: Secondary | ICD-10-CM | POA: Diagnosis present

## 2011-06-05 DIAGNOSIS — E785 Hyperlipidemia, unspecified: Secondary | ICD-10-CM | POA: Diagnosis present

## 2011-06-05 DIAGNOSIS — F1020 Alcohol dependence, uncomplicated: Secondary | ICD-10-CM | POA: Diagnosis present

## 2011-06-05 DIAGNOSIS — K859 Acute pancreatitis without necrosis or infection, unspecified: Principal | ICD-10-CM | POA: Diagnosis present

## 2011-06-05 DIAGNOSIS — G459 Transient cerebral ischemic attack, unspecified: Secondary | ICD-10-CM | POA: Insufficient documentation

## 2011-06-05 DIAGNOSIS — H539 Unspecified visual disturbance: Secondary | ICD-10-CM

## 2011-06-05 HISTORY — DX: Encounter for other specified aftercare: Z51.89

## 2011-06-05 HISTORY — DX: Atherosclerotic heart disease of native coronary artery without angina pectoris: I25.10

## 2011-06-05 HISTORY — DX: Cerebral infarction, unspecified: I63.9

## 2011-06-05 HISTORY — DX: Reserved for inherently not codable concepts without codable children: IMO0001

## 2011-06-05 HISTORY — DX: Anxiety disorder, unspecified: F41.9

## 2011-06-05 LAB — COMPREHENSIVE METABOLIC PANEL
ALT: 64 U/L — ABNORMAL HIGH (ref 0–35)
CO2: 25 mEq/L (ref 19–32)
Calcium: 10.7 mg/dL — ABNORMAL HIGH (ref 8.4–10.5)
Creatinine, Ser: 0.72 mg/dL (ref 0.50–1.10)
GFR calc Af Amer: >90 mL/min (ref >90)
GFR calc non Af Amer: >90 mL/min (ref >90)
Glucose, Bld: 115 mg/dL — ABNORMAL HIGH (ref 70–99)

## 2011-06-05 LAB — URINALYSIS, ROUTINE W REFLEX MICROSCOPIC
Nitrite: NEGATIVE
Protein, ur: 100 mg/dL — AB
Urobilinogen, UA: 1 mg/dL (ref 0.0–1.0)

## 2011-06-05 LAB — DIFFERENTIAL
Eosinophils Relative: 0 % (ref 0–5)
Lymphocytes Relative: 6 % — ABNORMAL LOW (ref 12–46)
Lymphs Abs: 0.8 10*3/uL (ref 0.7–4.0)
Monocytes Absolute: 0.9 10*3/uL (ref 0.1–1.0)
Monocytes Relative: 7 % (ref 3–12)

## 2011-06-05 LAB — TYPE AND SCREEN
ABO/RH(D): B POS
Antibody Screen: NEGATIVE

## 2011-06-05 LAB — CARDIAC PANEL(CRET KIN+CKTOT+MB+TROPI)
CK, MB: 1.5 ng/mL (ref 0.3–4.0)
Total CK: 66 U/L (ref 7–177)
Troponin I: <0.3 ng/mL (ref <0.30)

## 2011-06-05 LAB — PREGNANCY, URINE: Preg Test, Ur: NEGATIVE

## 2011-06-05 LAB — LIPID PANEL
HDL: 108 mg/dL (ref >39)
LDL Cholesterol: 91 mg/dL (ref 0–99)
Triglycerides: 71 mg/dL (ref <150)
VLDL: 14 mg/dL (ref 0–40)

## 2011-06-05 LAB — CBC
HCT: 42.2 % (ref 36.0–46.0)
MCV: 90 fL (ref 78.0–100.0)
RBC: 4.69 MIL/uL (ref 3.87–5.11)
WBC: 13.5 10*3/uL — ABNORMAL HIGH (ref 4.0–10.5)

## 2011-06-05 LAB — URINE MICROSCOPIC-ADD ON

## 2011-06-05 MED ORDER — HYDROMORPHONE HCL PF 1 MG/ML IJ SOLN
1.0000 mg | Freq: Once | INTRAMUSCULAR | Status: AC
  Administered 2011-06-05: 1 mg via INTRAVENOUS
  Filled 2011-06-05: qty 1

## 2011-06-05 MED ORDER — ASPIRIN 81 MG PO TABS: 81.0000 mg | ORAL_TABLET | Freq: Every day | ORAL | Status: DC

## 2011-06-05 MED ORDER — CLOPIDOGREL BISULFATE 75 MG PO TABS
75.0000 mg | ORAL_TABLET | Freq: Every day | ORAL | Status: DC
  Administered 2011-06-06 – 2011-06-13 (×8): 75 mg via ORAL
  Filled 2011-06-05 (×9): qty 1

## 2011-06-05 MED ORDER — SODIUM CHLORIDE 0.9 % IJ SOLN
3.0000 mL | Freq: Two times a day (BID) | INTRAMUSCULAR | Status: DC
  Administered 2011-06-06 – 2011-06-11 (×10): 3 mL via INTRAVENOUS

## 2011-06-05 MED ORDER — HYDROMORPHONE HCL PF 1 MG/ML IJ SOLN
1.0000 mg | INTRAMUSCULAR | Status: DC | PRN
  Administered 2011-06-06 – 2011-06-13 (×39): 1 mg via INTRAVENOUS
  Filled 2011-06-05 (×39): qty 1

## 2011-06-05 MED ORDER — SODIUM CHLORIDE 0.9 % IV BOLUS (SEPSIS)
1000.0000 mL | Freq: Once | INTRAVENOUS | Status: AC
  Administered 2011-06-05: 1000 mL via INTRAVENOUS

## 2011-06-05 MED ORDER — ONDANSETRON HCL 4 MG PO TABS: 4.0000 mg | ORAL_TABLET | Freq: Four times a day (QID) | ORAL | Status: DC | PRN

## 2011-06-05 MED ORDER — ACETAMINOPHEN 650 MG RE SUPP: 650.0000 mg | Freq: Four times a day (QID) | RECTAL | Status: DC | PRN

## 2011-06-05 MED ORDER — ONDANSETRON HCL 4 MG/2ML IJ SOLN
4.0000 mg | Freq: Once | INTRAMUSCULAR | Status: AC
  Administered 2011-06-05: 4 mg via INTRAVENOUS
  Filled 2011-06-05: qty 2

## 2011-06-05 MED ORDER — MORPHINE SULFATE 4 MG/ML IJ SOLN
4.0000 mg | Freq: Once | INTRAMUSCULAR | Status: AC
  Administered 2011-06-05: 4 mg via INTRAVENOUS
  Filled 2011-06-05: qty 1

## 2011-06-05 MED ORDER — CARVEDILOL 6.25 MG PO TABS
6.2500 mg | ORAL_TABLET | Freq: Two times a day (BID) | ORAL | Status: DC
  Administered 2011-06-06 – 2011-06-10 (×10): 6.25 mg via ORAL
  Filled 2011-06-05 (×13): qty 1

## 2011-06-05 MED ORDER — ONDANSETRON HCL 4 MG/2ML IJ SOLN
4.0000 mg | Freq: Four times a day (QID) | INTRAMUSCULAR | Status: DC | PRN
  Administered 2011-06-06 – 2011-06-13 (×9): 4 mg via INTRAVENOUS
  Filled 2011-06-05 (×9): qty 2

## 2011-06-05 MED ORDER — SODIUM CHLORIDE 0.9 % IV SOLN
INTRAVENOUS | Status: DC
  Administered 2011-06-05 – 2011-06-06 (×2): via INTRAVENOUS

## 2011-06-05 MED ORDER — ACETAMINOPHEN 325 MG PO TABS
650.0000 mg | ORAL_TABLET | Freq: Four times a day (QID) | ORAL | Status: DC | PRN
  Administered 2011-06-09 – 2011-06-13 (×3): 650 mg via ORAL
  Filled 2011-06-05 (×3): qty 2

## 2011-06-05 MED ORDER — SIMVASTATIN 20 MG PO TABS
20.0000 mg | ORAL_TABLET | Freq: Every day | ORAL | Status: DC
  Administered 2011-06-06 – 2011-06-12 (×7): 20 mg via ORAL
  Filled 2011-06-05 (×8): qty 1

## 2011-06-05 MED ORDER — ASPIRIN EC 81 MG PO TBEC
81.0000 mg | DELAYED_RELEASE_TABLET | Freq: Every day | ORAL | Status: DC
  Administered 2011-06-06 – 2011-06-13 (×8): 81 mg via ORAL
  Filled 2011-06-05 (×8): qty 1

## 2011-06-05 NOTE — ED Notes (Signed)
MD at bedside. 

## 2011-06-05 NOTE — ED Notes (Signed)
All bllod w/ bm x 1 weeks

## 2011-06-05 NOTE — H&P (Signed)
Lindsey [name] is an 41 y.o. female.   Cardiologist - Dr.Wall. Chief Complaint: Abdominal pain. HPI: 42 year-old female with history of CAD status post stenting, history of stroke, antithrombin III deficiency, hyperlipidemia has experiencing abdominal pain since Sunday, almost 5 days ago. She had a binge drinking of alcohol on Saturday and from that day she started developing epigastric pain which has been progressively worsening to the point she's not able to keep in anything. In trying to eat she gets nausea and vomiting. In the ER patient had labs done which showed a very elevated lipase. Patient's pain is relieved by pain relief medications. Patient denies any chest pain shortness of breath or any diarrhea. Patient has been admitted for further management of her acute pancreatitis. Patient did have pancreatitis last October and it was attributed to alcoholism. In that admission patient had a sonogram of the abdomen which did not show any gallstones.  Past Medical History  Diagnosis Date  . Stroke   . Coronary artery disease   . Myocardial infarct     History reviewed. No pertinent past surgical history.  History reviewed. No pertinent family history. Social History:  reports that she has quit smoking. She does not have any smokeless tobacco history on file. She reports that she drinks alcohol. Her drug history not on file.  Allergies: No Known Allergies  Medications Prior to Admission  Medication Dose Route Frequency Provider Last Rate Last Dose  . HYDROmorphone (DILAUDID) injection 1 mg  1 mg Intravenous Once Elijio Miles, MD   1 mg at 06/05/11 1937  . morphine 4 MG/ML injection 4 mg  4 mg Intravenous Once Elijio Miles, MD   4 mg at 06/05/11 1727  . ondansetron (ZOFRAN) injection 4 mg  4 mg Intravenous Once Elijio Miles, MD   4 mg at 06/05/11 1727  . sodium chloride 0.9 % bolus 1,000 mL  1,000 mL Intravenous Once Elijio Miles, MD   1,000 mL at 06/05/11 1726   No current outpatient  prescriptions on file as of 06/05/2011.    Results for orders placed during the hospital encounter of 06/05/11 (from the past 48 hour(s))  CBC     Status: Abnormal   Collection Time   06/05/11  3:33 PM      Component Value Range Comment   WBC 13.5 (*) 4.0 - 10.5 (K/uL)    RBC 4.69  3.87 - 5.11 (MIL/uL)    Hemoglobin 14.8  12.0 - 15.0 (g/dL)    HCT 16.1  09.6 - 04.5 (%)    MCV 90.0  78.0 - 100.0 (fL)    MCH 31.6  26.0 - 34.0 (pg)    MCHC 35.1  30.0 - 36.0 (g/dL)    RDW 40.9  81.1 - 91.4 (%)    Platelets 167  150 - 400 (K/uL)   DIFFERENTIAL     Status: Abnormal   Collection Time   06/05/11  3:33 PM      Component Value Range Comment   Neutrophils Relative 87 (*) 43 - 77 (%)    Neutro Abs 11.7 (*) 1.7 - 7.7 (K/uL)    Lymphocytes Relative 6 (*) 12 - 46 (%)    Lymphs Abs 0.8  0.7 - 4.0 (K/uL)    Monocytes Relative 7  3 - 12 (%)    Monocytes Absolute 0.9  0.1 - 1.0 (K/uL)    Eosinophils Relative 0  0 - 5 (%)    Eosinophils Absolute 0.1  0.0 - 0.7 (K/uL)  Basophils Relative 0  0 - 1 (%)    Basophils Absolute 0.0  0.0 - 0.1 (K/uL)   COMPREHENSIVE METABOLIC PANEL     Status: Abnormal   Collection Time   06/05/11  3:33 PM      Component Value Range Comment   Sodium 136  135 - 145 (mEq/L)    Potassium 3.4 (*) 3.5 - 5.1 (mEq/L)    Chloride 96  96 - 112 (mEq/L)    CO2 25  19 - 32 (mEq/L)    Glucose, Bld 115 (*) 70 - 99 (mg/dL)    BUN 9  6 - 23 (mg/dL)    Creatinine, Ser 1.61  0.50 - 1.10 (mg/dL)    Calcium 09.6 (*) 8.4 - 10.5 (mg/dL)    Total Protein 8.3  6.0 - 8.3 (g/dL)    Albumin 4.1  3.5 - 5.2 (g/dL)    AST 37  0 - 37 (U/L)    ALT 64 (*) 0 - 35 (U/L)    Alkaline Phosphatase 119 (*) 39 - 117 (U/L)    Total Bilirubin 0.9  0.3 - 1.2 (mg/dL)    GFR calc non Af Amer >90  >90 (mL/min)    GFR calc Af Amer >90  >90 (mL/min)   TROPONIN I     Status: Normal   Collection Time   06/05/11  3:36 PM      Component Value Range Comment   Troponin I <0.30  <0.30 (ng/mL)   TYPE AND SCREEN      Status: Normal   Collection Time   06/05/11  4:33 PM      Component Value Range Comment   ABO/RH(D) B POS      Antibody Screen NEG      Sample Expiration 06/08/2011     PREGNANCY, URINE     Status: Normal   Collection Time   06/05/11  6:02 PM      Component Value Range Comment   Preg Test, Ur NEGATIVE  NEGATIVE    URINALYSIS, ROUTINE W REFLEX MICROSCOPIC     Status: Abnormal   Collection Time   06/05/11  6:03 PM      Component Value Range Comment   Color, Urine AMBER (*) YELLOW  BIOCHEMICALS MAY BE AFFECTED BY COLOR   APPearance CLOUDY (*) CLEAR     Specific Gravity, Urine 1.031 (*) 1.005 - 1.030     pH 5.5  5.0 - 8.0     Glucose, UA NEGATIVE  NEGATIVE (mg/dL)    Hgb urine dipstick NEGATIVE  NEGATIVE     Bilirubin Urine LARGE (*) NEGATIVE     Ketones, ur >80 (*) NEGATIVE (mg/dL)    Protein, ur 045 (*) NEGATIVE (mg/dL)    Urobilinogen, UA 1.0  0.0 - 1.0 (mg/dL)    Nitrite NEGATIVE  NEGATIVE     Leukocytes, UA SMALL (*) NEGATIVE    URINE MICROSCOPIC-ADD ON     Status: Abnormal   Collection Time   06/05/11  6:03 PM      Component Value Range Comment   Squamous Epithelial / LPF MANY (*) RARE     WBC, UA 7-10  <3 (WBC/hpf)    Bacteria, UA MANY (*) RARE     Urine-Other MUCOUS PRESENT     LACTIC ACID, PLASMA     Status: Normal   Collection Time   06/05/11  6:21 PM      Component Value Range Comment   Lactic Acid, Venous 0.8  0.5 - 2.2 (  mmol/L)   LIPASE, BLOOD     Status: Abnormal   Collection Time   06/05/11  6:21 PM      Component Value Range Comment   Lipase 1405 (*) 11 - 59 (U/L)    No results found.  Review of Systems  Constitutional: Negative.   HENT: Negative.   Eyes: Negative.   Respiratory: Negative.   Cardiovascular: Negative.   Gastrointestinal: Positive for nausea, vomiting and abdominal pain.  Genitourinary: Negative.   Musculoskeletal: Negative.   Skin: Negative.   Neurological: Negative.   Endo/Heme/Allergies: Negative.   Psychiatric/Behavioral:  Negative.     Blood pressure 140/89, pulse 99, temperature 98.3 F (36.8 C), resp. rate 20, SpO2 100.00%. Physical Exam  Constitutional: She is oriented to person, place, and time. She appears well-developed and well-nourished. No distress.  HENT:  Head: Normocephalic and atraumatic.  Right Ear: External ear normal.  Left Ear: External ear normal.  Eyes: Conjunctivae are normal. Pupils are equal, round, and reactive to light. Right eye exhibits no discharge. Left eye exhibits no discharge.  Neck: Normal range of motion. Neck supple.  Cardiovascular: Normal rate and regular rhythm.   Respiratory: Effort normal and breath sounds normal. No respiratory distress. She has no wheezes. She has no rales.  GI: Soft. Bowel sounds are normal. She exhibits no distension. There is no tenderness. There is no rebound.  Musculoskeletal: Normal range of motion. She exhibits no edema and no tenderness.  Neurological: She is alert and oriented to person, place, and time.       Moves all limbs.  Skin: Skin is warm and dry. She is not diaphoretic.  Psychiatric: Her behavior is normal.     Assessment/Plan #1. Acute pancreatitis - the most likely cause is alcohol. In any case we will check a lipid panel to rule out hypertriglyceridemia and also sonogram of the abdomen to check for gallstones and CBD. Continue IV hydration and patient will be kept n.p.o. Overnight. Pain relief medications. #2. History of CAD status post stenting, CVA which left patient with peripheral vision loss and antithrombin III deficiency - continue present medications.  CODE STATUS - full code.  Eduard Clos 06/05/2011, 9:33 PM

## 2011-06-05 NOTE — ED Provider Notes (Signed)
History     CSN: [id number]  Arrival date & time 06/05/11  1537   First MD Initiated Contact with Patient 06/05/11 1658      Chief Complaint  Patient presents with  . Abdominal Pain    (Consider location/radiation/quality/duration/timing/severity/associated sxs/prior treatment) HPI Cc epigastric gnawing/grabbing pain onset 4 d ago, associted with nbnb n/v for 2 days and diarrhea with dark red blood for 2 days.  Sx's moderate, relieved with ibuprofen, worse with movement.   Past Medical History  Diagnosis Date  . Stroke   . Coronary artery disease   . Myocardial infarct   . Blood transfusion   . Anxiety     Past Surgical History  Procedure Date  . Tubal ligation   . Cardiac catheterization     History reviewed. No pertinent family history.  History  Substance Use Topics  . Smoking status: Former Smoker -- 0.5 packs/day for 10 years    Types: Cigarettes    Quit date: 06/04/2009  . Smokeless tobacco: Not on file  . Alcohol Use: 4.2 oz/week    7 Cans of beer per week     about 1 beer/day    OB History    Grav Para Term Preterm Abortions TAB SAB Ect Mult Living                  Review of Systems  Constitutional: Negative for fever.  Gastrointestinal: Positive for nausea, vomiting and abdominal pain.  All other systems reviewed and are negative.    Allergies  Review of patient's allergies indicates no known allergies.  Home Medications   No current outpatient prescriptions on file.  BP 127/89  Pulse 79  Temp(Src) 97.8 F (36.6 C) (Oral)  Resp 18  Ht 5\' 6"  (1.676 m)  Wt 168 lb 1.6 oz (76.25 kg)  BMI 27.13 kg/m2  SpO2 100%  LMP 05/30/2011  Physical Exam  Nursing note and vitals reviewed. Constitutional: She appears well-developed and well-nourished.  HENT:  Head: Normocephalic and atraumatic.  Eyes: Right eye exhibits no discharge. Left eye exhibits no discharge.  Neck: Normal range of motion. Neck supple.  Cardiovascular: Normal rate,  regular rhythm and normal heart sounds.   Pulmonary/Chest: Effort normal and breath sounds normal.  Abdominal: Soft. There is tenderness (moderate, epigastrium). There is no rebound and no guarding.  Musculoskeletal: She exhibits no edema and no tenderness.  Neurological: She is alert. GCS eye subscore is 4. GCS verbal subscore is 5. GCS motor subscore is 6.  Skin: Skin is warm and dry.  Psychiatric: She has a normal mood and affect. Her behavior is normal.    ED Course  Procedures (including critical care time)  Labs Reviewed  CBC - Abnormal; Notable for the following:    WBC 13.5 (*)    All other components within normal limits  DIFFERENTIAL - Abnormal; Notable for the following:    Neutrophils Relative 87 (*)    Neutro Abs 11.7 (*)    Lymphocytes Relative 6 (*)    All other components within normal limits  COMPREHENSIVE METABOLIC PANEL - Abnormal; Notable for the following:    Potassium 3.4 (*)    Glucose, Bld 115 (*)    Calcium 10.7 (*)    ALT 64 (*)    Alkaline Phosphatase 119 (*)    All other components within normal limits  URINALYSIS, ROUTINE W REFLEX MICROSCOPIC - Abnormal; Notable for the following:    Color, Urine AMBER (*) BIOCHEMICALS MAY BE AFFECTED BY COLOR  APPearance CLOUDY (*)    Specific Gravity, Urine 1.031 (*)    Bilirubin Urine LARGE (*)    Ketones, ur >80 (*)    Protein, ur 100 (*)    Leukocytes, UA SMALL (*)    All other components within normal limits  LIPASE, BLOOD - Abnormal; Notable for the following:    Lipase 1405 (*)    All other components within normal limits  URINE MICROSCOPIC-ADD ON - Abnormal; Notable for the following:    Squamous Epithelial / LPF MANY (*)    Bacteria, UA MANY (*)    All other components within normal limits  LIPID PANEL - Abnormal; Notable for the following:    Cholesterol 213 (*)    All other components within normal limits  TROPONIN I  TYPE AND SCREEN  PREGNANCY, URINE  LACTIC ACID, PLASMA  CARDIAC PANEL(CRET  KIN+CKTOT+MB+TROPI)  H. PYLORI ANTIBODY, IGG  COMPREHENSIVE METABOLIC PANEL  CBC   No results found.   1. Pancreatitis       MDM  Pt is in nad, afvss, nontoxic appearing, exam and hx as above. Pain is c/w past episode of pancreatitis, onset the next day after drinking 6 beers.  Lipase elevated but pt has no fever, no h/o chronic recurrent pancreatitis, doubt pseudocyst.  Internal medicine consulted, will admit.  Pt stable        Elijio Miles, MD 06/06/11 [phone]

## 2011-06-05 NOTE — ED Notes (Signed)
paun in upper rt abd rads to back n/v/d diarrhea was  black she staes

## 2011-06-05 NOTE — ED Provider Notes (Signed)
Female with a history of pancreatitis has had epigastric pain for the last 5 days. Pain radiates down the right side of her abdomen into her back. She admits to drinking 6 beers the night before her pain started. She says that she thought it would be okay because it was just beer and not like her. On exam, her abdomen is soft with mild to moderate tenderness diffusely with the worst tenderness being in the periumbilical area into the right lower abdomen. WBC is noted to be elevated. Lipase is pending at this time.  Dione Booze, MD 06/05/11 [phone]

## 2011-06-05 NOTE — ED Notes (Signed)
[id number] Ready

## 2011-06-06 ENCOUNTER — Encounter: Payer: Self-pay | Admitting: Family Medicine

## 2011-06-06 ENCOUNTER — Inpatient Hospital Stay (HOSPITAL_COMMUNITY): Payer: Self-pay

## 2011-06-06 LAB — COMPREHENSIVE METABOLIC PANEL
ALT: 38 U/L — ABNORMAL HIGH (ref 0–35)
AST: 24 U/L (ref 0–37)
Albumin: 2.9 g/dL — ABNORMAL LOW (ref 3.5–5.2)
CO2: 26 mEq/L (ref 19–32)
Chloride: 108 mEq/L (ref 96–112)
GFR calc non Af Amer: >90 mL/min (ref >90)
Potassium: 3.3 mEq/L — ABNORMAL LOW (ref 3.5–5.1)
Sodium: 141 mEq/L (ref 135–145)
Total Bilirubin: 0.8 mg/dL (ref 0.3–1.2)

## 2011-06-06 LAB — CBC
HCT: 35.7 % — ABNORMAL LOW (ref 36.0–46.0)
Hemoglobin: 11.7 g/dL — ABNORMAL LOW (ref 12.0–15.0)
MCHC: 32.8 g/dL (ref 30.0–36.0)
RDW: 13 % (ref 11.5–15.5)
WBC: 7.9 10*3/uL (ref 4.0–10.5)

## 2011-06-06 LAB — GLUCOSE, CAPILLARY
Glucose-Capillary: 109 mg/dL — ABNORMAL HIGH (ref 70–99)
Glucose-Capillary: 109 mg/dL — ABNORMAL HIGH (ref 70–99)

## 2011-06-06 MED ORDER — LACTATED RINGERS IV SOLN
INTRAVENOUS | Status: DC
Start: 2011-06-06 — End: 2011-06-08
  Administered 2011-06-06 – 2011-06-08 (×3): via INTRAVENOUS

## 2011-06-06 NOTE — Progress Notes (Signed)
PROGRESS NOTE  Lindsey [name] WUJ:[id number] DOB: June 21, 1970 DOA: 06/05/2011 PCP: Valera Castle, MD, MD  Brief narrative: 41 year old female admitted with alcoholic pancreatitis  Past medical history: CAD (history of MI with V. fib arrest and angioplasty 2000, PTCA bare-metal stent LAD 2009), hyperlipidemia, hypertension, TIA in October 2007, morbid obesity,  Consultants:  None  Procedures:  Ultrasound of abdomen 3/22 = findings suggestive of moderate hepatic steatosis  Antibiotics:  None   Subjective  5 on 10 pain. When she does have pain it comes in spasms and is intermittent and is 10 out of 10. Dilaudid seems to be helping but makes her nauseous. Has had little bit of retching no diarrhea. States she is aware she has a problem with drinking and admits to drinking 6 beers daily. This is increased over the past 2 months given some increasing stress at home   Objective   Interim History: Chart reviewed in entirety  Objective: Filed Vitals:   06/05/11 2100 06/05/11 2234 06/06/11 0201 06/06/11 0624  BP: 127/78 127/89 132/88 122/92  Pulse: 76 79 81 97  Temp:  97.8 F (36.6 C) 97.8 F (36.6 C) 97.7 F (36.5 C)  TempSrc:  Oral Oral Oral  Resp:  18 18 18   Height:  5\' 6"  (1.676 m)    Weight:  76.25 kg (168 lb 1.6 oz)  77.5 kg (170 lb 13.7 oz)  SpO2: 99% 100% 100% 100%    Intake/Output Summary (Last 24 hours) at 06/06/11 1533 Last data filed at 06/06/11 1235  Gross per 24 hour  Intake 1280.5 ml  Output    350 ml  Net  930.5 ml    Exam:  General: Pleasant obese African American female Cardiovascular: S1-S2 no murmur rub or gallop Respiratory: Clear, no other sound Abdomen: Mildly tender in the abdomen. No rebound Skin no lower extremity edema Neuro alert and oriented  Data Reviewed: Basic Metabolic Panel:  Lab 06/06/11 9562 06/05/11 1533  NA 141 136  K 3.3* 3.4*  CL 108 96  CO2 26 25  GLUCOSE 102* 115*  BUN 8 9  CREATININE 0.67 0.72  CALCIUM 9.1 10.7*    MG -- --  PHOS -- --   Liver Function Tests:  Lab 06/06/11 0543 06/05/11 1533  AST 24 37  ALT 38* 64*  ALKPHOS 87 119*  BILITOT 0.8 0.9  PROT 6.2 8.3  ALBUMIN 2.9* 4.1    Lab 06/05/11 1821  LIPASE 1405*  AMYLASE --   No results found for this basename: AMMONIA:5 in the last 168 hours CBC:  Lab 06/06/11 0543 06/05/11 1533  WBC 7.9 13.5*  NEUTROABS -- 11.7*  HGB 11.7* 14.8  HCT 35.7* 42.2  MCV 93.0 90.0  PLT 130* 167   Cardiac Enzymes:  Lab 06/05/11 2228 06/05/11 1536  CKTOTAL 66 --  CKMB 1.5 --  CKMBINDEX -- --  TROPONINI <0.30 <0.30   BNP: No components found with this basename: POCBNP:5 CBG:  Lab 06/06/11 1140 06/06/11 0426 06/05/11 2355  GLUCAP 101* 109* 105*    No results found for this or any previous visit (from the past 240 hour(s)).   Studies:              All Imaging reviewed and is as per above notation   Scheduled Meds:   . aspirin EC  81 mg Oral Daily  . carvedilol  6.25 mg Oral BID WC  . clopidogrel  75 mg Oral Q breakfast  .  HYDROmorphone (DILAUDID) injection  1  mg Intravenous Once  .  morphine injection  4 mg Intravenous Once  . ondansetron  4 mg Intravenous Once  . simvastatin  20 mg Oral q1800  . sodium chloride  1,000 mL Intravenous Once  . sodium chloride  3 mL Intravenous Q12H  . DISCONTD: aspirin  81 mg Oral Daily   Continuous Infusions:   . sodium chloride 150 mL/hr at 06/06/11 0436     Assessment/Plan: 1. Likely alcoholic pancreatitis-lipase is 1405. As hemodynamically stable at present time would hold on CT scan of the abdomen. Repeat lipase in the morning with CMP. Keep n.p.o. and keep on lactated Ringer. Monitor blood sugars every 8 2. CAD(history of MI with V. fib arrest and angioplasty 2000, PTCA bare-metal stent LAD 2009)-continue aspirin 81 mg, carvedilol 6.25 mg, Plavix 75 mg with sip of water 3. Hyperlipidemia-continue Zocor 20 mg 4. ? Hepatic steatosis-patient needs to lose weight-needs periodic  LFTs 5. Alcohol abuse-plenty of issues with regards to alcoholism-have asked social workers to help support her efforts to be cyst. Patient is aware that this is detrimental to her health  Code Status: Full Family Communication: None in room Disposition Plan: Home when able to take by mouth   Pleas Koch, MD  [facility] Pager [phone] 06/06/2011, 3:33 PM    LOS: 1 day       Patient Active Problem List  Diagnoses  . HYPERLIPIDEMIA-MIXED  . OBESITY-MORBID (>100')  . HYPERTENSION  . MYOCARDIAL INFARCTION, ANTEROLATERAL WALL, INITIAL EPISODE  . CAD, MI/V fib arrest + angioplasty 2000, PTCA and BM stent LAD 2009  . TIA in Oct 2007  . Pancreatitis, acute

## 2011-06-06 NOTE — Progress Notes (Signed)
Utilization Review Completed.Lindsey [name] T3/22/2013   

## 2011-06-07 LAB — LIPASE, BLOOD: Lipase: 1685 U/L — ABNORMAL HIGH (ref 11–59)

## 2011-06-07 LAB — COMPREHENSIVE METABOLIC PANEL
ALT: 29 U/L (ref 0–35)
AST: 22 U/L (ref 0–37)
Albumin: 2.7 g/dL — ABNORMAL LOW (ref 3.5–5.2)
Alkaline Phosphatase: 78 U/L (ref 39–117)
CO2: 24 mEq/L (ref 19–32)
Chloride: 105 mEq/L (ref 96–112)
GFR calc non Af Amer: >90 mL/min (ref >90)
Potassium: 3.3 mEq/L — ABNORMAL LOW (ref 3.5–5.1)
Total Bilirubin: 0.8 mg/dL (ref 0.3–1.2)

## 2011-06-07 LAB — CBC
MCH: 30.6 pg (ref 26.0–34.0)
MCHC: 33.8 g/dL (ref 30.0–36.0)
MCV: 90.6 fL (ref 78.0–100.0)
Platelets: 123 10*3/uL — ABNORMAL LOW (ref 150–400)
RDW: 12.8 % (ref 11.5–15.5)

## 2011-06-07 LAB — GLUCOSE, CAPILLARY
Glucose-Capillary: 97 mg/dL (ref 70–99)
Glucose-Capillary: 81 mg/dL (ref 70–99)

## 2011-06-07 NOTE — Progress Notes (Signed)
PROGRESS NOTE  Lindsey [name]:[id number] DOB: 10-10-1970 DOA: 06/05/2011 PCP: Valera Castle, MD, MD  Brief narrative: 41 year old female admitted with alcoholic pancreatitis  Past medical history: CAD (history of MI with V. fib arrest and angioplasty 2000, PTCA bare-metal stent LAD 2009), hyperlipidemia, hypertension, TIA in October 2007, morbid obesity,  Consultants:  None  Procedures:  Ultrasound of abdomen 3/22 = findings suggestive of moderate hepatic steatosis  Antibiotics:  None   Subjective  Still does have pain. Passed a little blood in stool.  Was BRB per rectum.  H/o hemoprrhoids Was straining to pass stool at the time   Objective   Interim History: Chart reviewed in entirety  Objective: Filed Vitals:   06/06/11 1450 06/06/11 2109 06/07/11 0542 06/07/11 1509  BP: 137/95 112/79 116/82 117/73  Pulse: 90 84 78 89  Temp: 98.4 F (36.9 C) 97.9 F (36.6 C) 98.7 F (37.1 C) 98 F (36.7 C)  TempSrc: Oral Oral Oral Oral  Resp: 20 20 18 18   Height:      Weight:   79.606 kg (175 lb 8 oz)   SpO2: 99% 98% 99% 98%    Intake/Output Summary (Last 24 hours) at 06/07/11 1633 Last data filed at 06/07/11 0059  Gross per 24 hour  Intake    670 ml  Output    390 ml  Net    280 ml    Exam:  General: Pleasant obese African American female Cardiovascular: S1-S2 no murmur rub or gallop Respiratory: Clear, no other sound Abdomen: Mildly tender in the abdomen. No rebound.  External hemorrhoid at anal verge Skin no lower extremity edema Neuro alert and oriented  Data Reviewed: Basic Metabolic Panel:  Lab 06/07/11 9562 06/06/11 0543 06/05/11 1533  NA 138 141 136  K 3.3* 3.3* --  CL 105 108 96  CO2 24 26 25   GLUCOSE 71 102* 115*  BUN 6 8 9   CREATININE 0.53 0.67 0.72  CALCIUM 9.1 9.1 10.7*  MG -- -- --  PHOS -- -- --   Liver Function Tests:  Lab 06/07/11 0630 06/06/11 0543 06/05/11 1533  AST 22 24 37  ALT 29 38* 64*  ALKPHOS 78 87 119*  BILITOT 0.8 0.8  0.9  PROT 5.7* 6.2 8.3  ALBUMIN 2.7* 2.9* 4.1    Lab 06/07/11 0630 06/05/11 1821  LIPASE 1685* 1405*  AMYLASE -- --   No results found for this basename: AMMONIA:5 in the last 168 hours CBC:  Lab 06/07/11 0630 06/06/11 0543 06/05/11 1533  WBC 9.3 7.9 13.5*  NEUTROABS -- -- 11.7*  HGB 10.4* 11.7* 14.8  HCT 30.8* 35.7* 42.2  MCV 90.6 93.0 90.0  PLT 123* 130* 167   Cardiac Enzymes:  Lab 06/05/11 2228 06/05/11 1536  CKTOTAL 66 --  CKMB 1.5 --  CKMBINDEX -- --  TROPONINI <0.30 <0.30   BNP: No components found with this basename: POCBNP:5 CBG:  Lab 06/07/11 1530 06/07/11 0614 06/07/11 0308 06/06/11 2014 06/06/11 1622  GLUCAP 62* 81 87 90 109*    No results found for this or any previous visit (from the past 240 hour(s)).   Studies:              All Imaging reviewed and is as per above notation   Scheduled Meds:    . aspirin EC  81 mg Oral Daily  . carvedilol  6.25 mg Oral BID WC  . clopidogrel  75 mg Oral Q breakfast  . simvastatin  20 mg Oral q1800  .  sodium chloride  3 mL Intravenous Q12H   Continuous Infusions:    . lactated ringers 100 mL/hr at 06/07/11 0059     Assessment/Plan: 1. Likely alcoholic pancreatitis-lipase rose from 1405-->1685. As hemodynamically stable at present time would hold on CT scan of the abdomen. Will allow clear fluids-although still has abd pain, and would ask her to not take if develops nasuea 2. CAD(history of MI with V. fib arrest and angioplasty 2000, PTCA bare-metal stent LAD 2009)-continue aspirin 81 mg, carvedilol 6.25 mg, Plavix 75 mg with sip of water 3. Hyperlipidemia-continue Zocor 20 mg 4. ? Hepatic steatosis-patient needs to lose weight-needs periodic LFTs 5. Alcohol abuse-plenty of issues with regards to alcoholism-have asked social workers to help support her efforts to be cyst. Patient is aware that this is detrimental to her health  Code Status: Full Family Communication: None in room Disposition Plan: Home when  able to take by mouth   Pleas Koch, MD  [facility] Pager [phone] 06/07/2011, 4:33 PM    LOS: 2 days

## 2011-06-08 LAB — COMPREHENSIVE METABOLIC PANEL
ALT: 26 U/L (ref 0–35)
AST: 25 U/L (ref 0–37)
CO2: 27 mEq/L (ref 19–32)
Calcium: 8.9 mg/dL (ref 8.4–10.5)
Chloride: 103 mEq/L (ref 96–112)
Creatinine, Ser: 0.63 mg/dL (ref 0.50–1.10)
GFR calc Af Amer: >90 mL/min (ref >90)
GFR calc non Af Amer: >90 mL/min (ref >90)
Glucose, Bld: 72 mg/dL (ref 70–99)
Total Bilirubin: 0.6 mg/dL (ref 0.3–1.2)

## 2011-06-08 LAB — GLUCOSE, CAPILLARY: Glucose-Capillary: 112 mg/dL — ABNORMAL HIGH (ref 70–99)

## 2011-06-08 NOTE — Progress Notes (Signed)
PROGRESS NOTE  Lindsey [name] ZOX:[id number] DOB: 1971/01/14 DOA: 06/05/2011 PCP: Valera Castle, MD, MD  Brief narrative: 41 year old female admitted with alcoholic pancreatitis  Past medical history: CAD (history of MI with V. fib arrest and angioplasty 2000, PTCA bare-metal stent LAD 2009), hyperlipidemia, hypertension, TIA in October 2007, morbid obesity,  Consultants:  None  Procedures:  Ultrasound of abdomen 3/22 = findings suggestive of moderate hepatic steatosis  Antibiotics:  None   Subjective  Mild pain.  Tolerating a liquid diet this morning and then soft diet at lunch no issue.     Objective   Interim History: Chart reviewed in entirety  Objective: Filed Vitals:   06/07/11 1509 06/07/11 2111 06/08/11 0441 06/08/11 1400  BP: 117/73 113/72 107/66 107/70  Pulse: 89 85 81 83  Temp: 98 F (36.7 C) 99 F (37.2 C) 98.8 F (37.1 C) 98.3 F (36.8 C)  TempSrc: Oral Oral Oral Oral  Resp: 18 18 20 20   Height:      Weight:   81.149 kg (178 lb 14.4 oz)   SpO2: 98% 100% 99% 95%    Intake/Output Summary (Last 24 hours) at 06/08/11 1515 Last data filed at 06/08/11 0900  Gross per 24 hour  Intake 2541.17 ml  Output   1200 ml  Net 1341.17 ml    Exam:  General: Pleasant obese African American female Cardiovascular: S1-S2 no murmur rub or gallop Respiratory: Clear, no other sound Abdomen: Mildly tender in the abdomen. No rebound.  External hemorrhoid at anal verge Skin no lower extremity edema Neuro alert and oriented  Data Reviewed: Basic Metabolic Panel:  Lab 06/08/11 8119 06/07/11 0630 06/06/11 0543 06/05/11 1533  NA 139 138 141 136  K 3.2* 3.3* -- --  CL 103 105 108 96  CO2 27 24 26 25   GLUCOSE 72 71 102* 115*  BUN 4* 6 8 9   CREATININE 0.63 0.53 0.67 0.72  CALCIUM 8.9 9.1 9.1 10.7*  MG -- -- -- --  PHOS -- -- -- --   Liver Function Tests:  Lab 06/08/11 0515 06/07/11 0630 06/06/11 0543 06/05/11 1533  AST 25 22 24  37  ALT 26 29 38* 64*  ALKPHOS  89 78 87 119*  BILITOT 0.6 0.8 0.8 0.9  PROT 5.5* 5.7* 6.2 8.3  ALBUMIN 2.6* 2.7* 2.9* 4.1    Lab 06/08/11 0515 06/07/11 0630 06/05/11 1821  LIPASE 730* 1685* 1405*  AMYLASE -- -- --   No results found for this basename: AMMONIA:5 in the last 168 hours CBC:  Lab 06/07/11 0630 06/06/11 0543 06/05/11 1533  WBC 9.3 7.9 13.5*  NEUTROABS -- -- 11.7*  HGB 10.4* 11.7* 14.8  HCT 30.8* 35.7* 42.2  MCV 90.6 93.0 90.0  PLT 123* 130* 167   Cardiac Enzymes:  Lab 06/05/11 2228 06/05/11 1536  CKTOTAL 66 --  CKMB 1.5 --  CKMBINDEX -- --  TROPONINI <0.30 <0.30   BNP: No components found with this basename: POCBNP:5 CBG:  Lab 06/08/11 1124 06/08/11 0559 06/07/11 2107 06/07/11 1530 06/07/11 0614  GLUCAP 81 87 107* 62* 81    No results found for this or any previous visit (from the past 240 hour(s)).   Studies:              All Imaging reviewed and is as per above notation   Scheduled Meds:    . aspirin EC  81 mg Oral Daily  . carvedilol  6.25 mg Oral BID WC  . clopidogrel  75 mg Oral  Q breakfast  . simvastatin  20 mg Oral q1800  . sodium chloride  3 mL Intravenous Q12H   Continuous Infusions:    . lactated ringers 50 mL/hr at 06/08/11 0042     Assessment/Plan: 1. Likely alcoholic pancreatitis-lipase rose from 1405-->1685-->730.  As hemodynamically stable at present time would hold on CT scan of the abdomen. Will allow clear fluids-although still has abd pain, and would ask her to not take if develops nausea. 2. CAD(history of MI with V. fib arrest and angioplasty 2000, PTCA bare-metal stent LAD 2009)-continue aspirin 81 mg, carvedilol 6.25 mg, Plavix 75 mg with sip of water. 3. Hyperlipidemia-continue Zocor 20 mg 4. ? Hepatic steatosis-patient needs to lose weight-needs periodic LFTs 5. Alcohol abuse-plenty of issues with regards to alcoholism-have asked social workers to help support her efforts to be cyst. Patient is aware that this is detrimental to her health  Code  Status: Full Family Communication: None in room Disposition Plan: Home when able to take by mouth-hopefully in one to 2 days   Pleas Koch, MD  [facility] Pager [phone] 06/08/2011, 3:15 PM    LOS: 3 days

## 2011-06-09 LAB — GLUCOSE, CAPILLARY
Glucose-Capillary: 150 mg/dL — ABNORMAL HIGH (ref 70–99)
Glucose-Capillary: 100 mg/dL — ABNORMAL HIGH (ref 70–99)
Glucose-Capillary: 111 mg/dL — ABNORMAL HIGH (ref 70–99)

## 2011-06-09 NOTE — Progress Notes (Signed)
PROGRESS NOTE  Lindsey [name] AVW:[id number] DOB: 1970/04/18 DOA: 06/05/2011 PCP: Valera Castle, MD, MD  Brief narrative: 41 year old female admitted with alcoholic pancreatitis  Past medical history: CAD (history of MI with V. fib arrest and angioplasty 2000, PTCA bare-metal stent LAD 2009), hyperlipidemia, hypertension, TIA in October 2007, morbid obesity,  Consultants:  None  Procedures:  Ultrasound of abdomen 3/22 = findings suggestive of moderate hepatic steatosis  Antibiotics:  None   Subjective  Pain still present.  Tolerating a full diet this morning.  Hassignificant abdominal pain.  Needed IV morphine. Not ready to transition to PO opiates No N/v/D   Objective   Interim History: Chart reviewed in entirety  Objective: Filed Vitals:   06/08/11 0441 06/08/11 1400 06/08/11 2052 06/09/11 0529  BP: 107/66 107/70 109/78 101/67  Pulse: 81 83 70 67  Temp: 98.8 F (37.1 C) 98.3 F (36.8 C) 97 F (36.1 C) 98.4 F (36.9 C)  TempSrc: Oral Oral Oral Oral  Resp: 20 20 16 18   Height:      Weight: 81.149 kg (178 lb 14.4 oz)   81.557 kg (179 lb 12.8 oz)  SpO2: 99% 95% 98% 97%    Intake/Output Summary (Last 24 hours) at 06/09/11 1101 Last data filed at 06/09/11 1020  Gross per 24 hour  Intake   1173 ml  Output   1750 ml  Net   -577 ml    Exam:  General: Pleasant obese African American female Cardiovascular: S1-S2 no murmur rub or gallop Respiratory: Clear, no other sound Abdomen: Mildly tender in the abdomen. No rebound.  External hemorrhoid at anal verge Skin no lower extremity edema Neuro alert and oriented  Data Reviewed: Basic Metabolic Panel:  Lab 06/08/11 8295 06/07/11 0630 06/06/11 0543 06/05/11 1533  NA 139 138 141 136  K 3.2* 3.3* -- --  CL 103 105 108 96  CO2 27 24 26 25   GLUCOSE 72 71 102* 115*  BUN 4* 6 8 9   CREATININE 0.63 0.53 0.67 0.72  CALCIUM 8.9 9.1 9.1 10.7*  MG -- -- -- --  PHOS -- -- -- --   Liver Function Tests:  Lab 06/08/11  0515 06/07/11 0630 06/06/11 0543 06/05/11 1533  AST 25 22 24  37  ALT 26 29 38* 64*  ALKPHOS 89 78 87 119*  BILITOT 0.6 0.8 0.8 0.9  PROT 5.5* 5.7* 6.2 8.3  ALBUMIN 2.6* 2.7* 2.9* 4.1    Lab 06/08/11 0515 06/07/11 0630 06/05/11 1821  LIPASE 730* 1685* 1405*  AMYLASE -- -- --   No results found for this basename: AMMONIA:5 in the last 168 hours CBC:  Lab 06/07/11 0630 06/06/11 0543 06/05/11 1533  WBC 9.3 7.9 13.5*  NEUTROABS -- -- 11.7*  HGB 10.4* 11.7* 14.8  HCT 30.8* 35.7* 42.2  MCV 90.6 93.0 90.0  PLT 123* 130* 167   Cardiac Enzymes:  Lab 06/05/11 2228 06/05/11 1536  CKTOTAL 66 --  CKMB 1.5 --  CKMBINDEX -- --  TROPONINI <0.30 <0.30   BNP: No components found with this basename: POCBNP:5 CBG:  Lab 06/08/11 2014 06/08/11 1632 06/08/11 1124 06/08/11 0559 06/07/11 2107  GLUCAP 112* 112* 81 87 107*    No results found for this or any previous visit (from the past 240 hour(s)).   Studies:              All Imaging reviewed and is as per above notation   Scheduled Meds:    . aspirin EC  81 mg Oral  Daily  . carvedilol  6.25 mg Oral BID WC  . clopidogrel  75 mg Oral Q breakfast  . simvastatin  20 mg Oral q1800  . sodium chloride  3 mL Intravenous Q12H   Continuous Infusions:    . DISCONTD: lactated ringers 50 mL/hr at 06/08/11 0042     Assessment/Plan: 1. Likely alcoholic pancreatitis-lipase rose from 1405-->1685-->730.  RPt Lipase and CMET am  As hemodynamically stable at present time would hold on CT scan of the abdomen. Will allow diet.  Will wean off IV meds to see if can tolerate orals tomorrow, which would be threshold for discharge 2. CAD(history of MI with V. fib arrest and angioplasty 2000, PTCA bare-metal stent LAD 2009)-continue aspirin 81 mg, carvedilol 6.25 mg, Plavix 75 mg  3. Hyperlipidemia-continue Zocor 20 mg 4. ? Hepatic steatosis-patient needs to lose weight-needs periodic LFTs 5. Alcohol abuse-plenty of issues with regards to  alcoholism-have asked social workers to help support her efforts to desist. Patient is aware that this is detrimental to her health  Code Status: Full Family Communication: None in room Disposition Plan: Home when able to take by mouth-hopefully in one to 2 days   Pleas Koch, MD  [facility] Pager [phone] 06/09/2011, 11:01 AM    LOS: 4 days

## 2011-06-10 ENCOUNTER — Inpatient Hospital Stay (HOSPITAL_COMMUNITY): Payer: Self-pay

## 2011-06-10 LAB — COMPREHENSIVE METABOLIC PANEL
Alkaline Phosphatase: 105 U/L (ref 39–117)
BUN: <3 mg/dL — ABNORMAL LOW (ref 6–23)
CO2: 30 mEq/L (ref 19–32)
Chloride: 102 mEq/L (ref 96–112)
Creatinine, Ser: 0.63 mg/dL (ref 0.50–1.10)
GFR calc Af Amer: >90 mL/min (ref >90)
GFR calc non Af Amer: >90 mL/min (ref >90)
Glucose, Bld: 99 mg/dL (ref 70–99)
Potassium: 2.8 mEq/L — ABNORMAL LOW (ref 3.5–5.1)
Total Bilirubin: 0.4 mg/dL (ref 0.3–1.2)

## 2011-06-10 LAB — GLUCOSE, CAPILLARY
Glucose-Capillary: 132 mg/dL — ABNORMAL HIGH (ref 70–99)
Glucose-Capillary: 116 mg/dL — ABNORMAL HIGH (ref 70–99)

## 2011-06-10 MED ORDER — IOHEXOL 300 MG/ML  SOLN
20.0000 mL | INTRAMUSCULAR | Status: AC
  Administered 2011-06-10 (×2): 20 mL via ORAL

## 2011-06-10 MED ORDER — TRAMADOL HCL 50 MG PO TABS
50.0000 mg | ORAL_TABLET | Freq: Four times a day (QID) | ORAL | Status: DC | PRN
  Administered 2011-06-10 – 2011-06-11 (×4): 50 mg via ORAL
  Filled 2011-06-10 (×5): qty 1

## 2011-06-10 MED ORDER — IOHEXOL 300 MG/ML  SOLN: 40.0000 mL | Freq: Once | INTRAMUSCULAR | Status: DC | PRN

## 2011-06-10 MED ORDER — IOHEXOL 300 MG/ML  SOLN
100.0000 mL | Freq: Once | INTRAMUSCULAR | Status: AC | PRN
  Administered 2011-06-10: 100 mL via INTRAVENOUS

## 2011-06-10 MED ORDER — POTASSIUM CHLORIDE CRYS ER 20 MEQ PO TBCR
40.0000 meq | EXTENDED_RELEASE_TABLET | Freq: Two times a day (BID) | ORAL | Status: DC
  Administered 2011-06-10 – 2011-06-13 (×7): 40 meq via ORAL
  Filled 2011-06-10 (×8): qty 2

## 2011-06-10 MED ORDER — POTASSIUM CHLORIDE 20 MEQ PO PACK: 40.0000 meq | PACK | Freq: Two times a day (BID) | ORAL | Status: DC

## 2011-06-10 NOTE — Progress Notes (Addendum)
PROGRESS NOTE  Lindsey [name]:[id number] DOB: 05/18/1970 DOA: 06/05/2011 PCP: Valera Castle, MD, MD  Brief narrative: 41 year old female admitted with alcoholic pancreatitis  Past medical history: CAD (history of MI with V. fib arrest and angioplasty 2000, PTCA bare-metal stent LAD 2009), hyperlipidemia, hypertension, TIA in October 2007, morbid obesity,  Consultants:  None  Procedures:  Ultrasound of abdomen 3/22 = findings suggestive of moderate hepatic steatosis  Antibiotics:  None   Subjective  Pain still present.   transitioned to PO opiates this am, but unable to keep down lunch No N/v/D   Objective   Interim History: Chart reviewed in entirety  Objective: Filed Vitals:   06/09/11 1730 06/09/11 2010 06/10/11 0851 06/10/11 1200  BP: 120/85 112/68 126/86 108/79  Pulse: 60 71 67 63  Temp:  98.2 F (36.8 C) 97.8 F (36.6 C) 97.6 F (36.4 C)  TempSrc:  Oral Oral Oral  Resp:  18 18 18   Height:      Weight:      SpO2:  100% 99% 98%    Intake/Output Summary (Last 24 hours) at 06/10/11 1714 Last data filed at 06/10/11 1431  Gross per 24 hour  Intake   1770 ml  Output    600 ml  Net   1170 ml    Exam:  General: Pleasant obese African American female Cardiovascular: S1-S2 no murmur rub or gallop Respiratory: Clear, no other sound Abdomen: Mildly tender in the abdomen. No rebound.  External hemorrhoid at anal verge Skin no lower extremity edema Neuro alert and oriented  Data Reviewed: Basic Metabolic Panel:  Lab 06/10/11 9562 06/08/11 0515 06/07/11 0630 06/06/11 0543 06/05/11 1533  NA 140 139 138 141 136  K 2.8* 3.2* -- -- --  CL 102 103 105 108 96  CO2 30 27 24 26 25   GLUCOSE 99 72 71 102* 115*  BUN <3* 4* 6 8 9   CREATININE 0.63 0.63 0.53 0.67 0.72  CALCIUM 8.9 8.9 9.1 9.1 10.7*  MG -- -- -- -- --  PHOS -- -- -- -- --   Liver Function Tests:  Lab 06/10/11 0533 06/08/11 0515 06/07/11 0630 06/06/11 0543 06/05/11 1533  AST 27 25 22 24  37    ALT 28 26 29  38* 64*  ALKPHOS 105 89 78 87 119*  BILITOT 0.4 0.6 0.8 0.8 0.9  PROT 5.7* 5.5* 5.7* 6.2 8.3  ALBUMIN 2.5* 2.6* 2.7* 2.9* 4.1    Lab 06/10/11 0533 06/08/11 0515 06/07/11 0630 06/05/11 1821  LIPASE 262* 730* 1685* 1405*  AMYLASE -- -- -- --   No results found for this basename: AMMONIA:5 in the last 168 hours CBC:  Lab 06/07/11 0630 06/06/11 0543 06/05/11 1533  WBC 9.3 7.9 13.5*  NEUTROABS -- -- 11.7*  HGB 10.4* 11.7* 14.8  HCT 30.8* 35.7* 42.2  MCV 90.6 93.0 90.0  PLT 123* 130* 167   Cardiac Enzymes:  Lab 06/05/11 2228 06/05/11 1536  CKTOTAL 66 --  CKMB 1.5 --  CKMBINDEX -- --  TROPONINI <0.30 <0.30   BNP: No components found with this basename: POCBNP:5 CBG:  Lab 06/10/11 1619 06/10/11 1104 06/10/11 0637 06/09/11 2133 06/09/11 1715  GLUCAP 116* 132* 109* 111* 100*    No results found for this or any previous visit (from the past 240 hour(s)).   Studies:              All Imaging reviewed and is as per above notation   Scheduled Meds:    . aspirin  EC  81 mg Oral Daily  . carvedilol  6.25 mg Oral BID WC  . clopidogrel  75 mg Oral Q breakfast  . potassium chloride  40 mEq Oral BID  . simvastatin  20 mg Oral q1800  . sodium chloride  3 mL Intravenous Q12H  . DISCONTD: potassium chloride  40 mEq Oral BID   Continuous Infusions:     Assessment/Plan: 1. Likely alcoholic pancreatitis-lipase rose from 1405-->1685-->730.  RPt Lipase and CMET am  As hemodynamically stable at present time would hold on CT scan of the abdomen. Will allow diet.  Will wean off IV meds to see if can tolerate orals tomorrow, which would be threshold for discharge.  As she isn't able to keep down food and still has abdominal pain, although unlikely to have hemorrhagic or necrotic pancreatitis, we will get a CT scan abdomen with contrast. 2. CAD(history of MI with V. fib arrest and angioplasty 2000, PTCA bare-metal stent LAD 2009)-continue aspirin 81 mg, carvedilol 6.25 mg,  Plavix 75 mg  3. Hyperlipidemia-continue Zocor 20 mg 4. ? Hepatic steatosis-patient needs to lose weight-needs periodic LFTs 5. Alcohol abuse-plenty of issues with regards to alcoholism-have asked social workers to help support her efforts to desist. Patient is aware that this is detrimental to her health 6. ?Htn-has had numerous episodes of high blood pressure but is also in significant pain.  Will hold anti-htn unless bp above 150/90 persistently-can be followed as outpatient  Code Status: Full Family Communication: None in room Disposition Plan: Home when able to take by mouth-hopefully in one to 2 days   Pleas Koch, MD  [facility] Pager [phone] 06/10/2011, 5:13 PM    LOS: 5 days

## 2011-06-10 NOTE — Progress Notes (Signed)
Clinical Social Work Department BRIEF PSYCHOSOCIAL ASSESSMENT 06/10/2011  Patient:  Lindsey [name], Lindsey [name]     Account Number:  [id number]     Admit date:  06/05/2011  Clinical Social Worker:  Juliette Mangle  Date/Time:  06/10/2011 02:30 PM  Referred by:  Physician  Date Referred:  06/10/2011 Referred for  Substance Abuse   Other Referral:   Interview type:  Patient Other interview type:    PSYCHOSOCIAL DATA Living Status:  FAMILY Admitted from facility:   Level of care:   Primary support name:  Butler,Vivian Primary support relationship to patient:  PARENT Degree of support available:   Good    CURRENT CONCERNS Current Concerns  Substance Abuse   Other Concerns:    SOCIAL WORK ASSESSMENT / PLAN CSW met with patient to offer support and also discuss substance abuse and substance abuse resources. Patient was very candid and admitted to using ETOH in excess on a daily basis. Pt reports that she is a witness in a stressful court case which has led to her drinking ETOH. Pt reports that she starts drinking at 10 am and continues to drink all day. CSW re-inforced that if she continues to drink she will probably return to the hospital with similar medical problems. The patient reported that she realizes the consequences of her drinking and is going to quit. CSW provided patient with resoures to AA, the [employer], and [employer].   Assessment/plan status:  No Further Intervention Required Other assessment/ plan:   Information/referral to community resources:   SA resources: AA, the [employer], and [employer].    PATIENT'S/FAMILY'S RESPONSE TO PLAN OF CARE: Patient was very receptive and appreciative of resources, support and information provided by CSW. CSW will sign off as no social work intervention is needed at this time.    Sabino Niemann, MSW, [employer] [phone]

## 2011-06-11 LAB — BASIC METABOLIC PANEL
CO2: 31 mEq/L (ref 19–32)
Chloride: 101 mEq/L (ref 96–112)
GFR calc non Af Amer: >90 mL/min (ref >90)
Glucose, Bld: 112 mg/dL — ABNORMAL HIGH (ref 70–99)
Potassium: 3.4 mEq/L — ABNORMAL LOW (ref 3.5–5.1)
Sodium: 139 mEq/L (ref 135–145)

## 2011-06-11 MED ORDER — SODIUM CHLORIDE 0.9 % IV SOLN
INTRAVENOUS | Status: DC
  Administered 2011-06-11 (×2): via INTRAVENOUS
  Administered 2011-06-12: 100 mL/h via INTRAVENOUS
  Administered 2011-06-12 – 2011-06-13 (×2): via INTRAVENOUS

## 2011-06-11 NOTE — Progress Notes (Signed)
Patient ID: Lindsey [name]  female  [id number]    DOB: September 23, 1970    DOA: 06/05/2011  PCP: Valera Castle, MD, MD  Subjective: States was not able to tolerate regular diet, still having left upper quadrant abdominal pain and was not able to hold down regular diet  Objective: Weight change:   Intake/Output Summary (Last 24 hours) at 06/11/11 1310 Last data filed at 06/11/11 1200  Gross per 24 hour  Intake    964 ml  Output   2200 ml  Net  -1236 ml   Blood pressure 110/88, pulse 56, temperature 97.1 F (36.2 C), temperature source Oral, resp. rate 20, height 5\' 6"  (1.676 m), weight 81.557 kg (179 lb 12.8 oz), last menstrual period 05/30/2011, SpO2 100.00%.  Physical Exam: General: Alert and awake, oriented x3, not in any acute distress. HEENT: anicteric sclera, pupils reactive to light and accommodation, EOMI CVS: S1-S2 clear, no murmur rubs or gallops Chest: clear to auscultation bilaterally, no wheezing, rales or rhonchi Abdomen: Left upper quadrant tenderness to deep palpation  nondistended, normal bowel sounds, no organomegaly Extremities: no cyanosis, clubbing or edema noted bilaterally Neuro: Cranial nerves II-XII intact, no focal neurological deficits  Lab Results: Basic Metabolic Panel:  Lab 06/11/11 6213 06/10/11 0533  NA 139 140  K 3.4* 2.8*  CL 101 102  CO2 31 30  GLUCOSE 112* 99  BUN <3* <3*  CREATININE 0.65 0.63  CALCIUM 9.4 8.9  MG -- --  PHOS -- --   Liver Function Tests:  Lab 06/10/11 0533 06/08/11 0515  AST 27 25  ALT 28 26  ALKPHOS 105 89  BILITOT 0.4 0.6  PROT 5.7* 5.5*  ALBUMIN 2.5* 2.6*    Lab 06/11/11 1043 06/10/11 0533  LIPASE 208* 262*  AMYLASE -- --   CBC:  Lab 06/07/11 0630 06/06/11 0543 06/05/11 1533  WBC 9.3 7.9 --  NEUTROABS -- -- 11.7*  HGB 10.4* 11.7* --  HCT 30.8* 35.7* --  MCV 90.6 93.0 --  PLT 123* 130* --   Cardiac Enzymes:  Lab 06/05/11 2228 06/05/11 1536  CKTOTAL 66 --  CKMB 1.5 --  CKMBINDEX -- --  TROPONINI  <0.30 <0.30   CBG:  Lab 06/11/11 0555 06/10/11 2114 06/10/11 1619 06/10/11 1104 06/10/11 0637  GLUCAP 96 96 116* 132* 109*     Micro Results: No results found for this or any previous visit (from the past 240 hour(s)).  Studies/Results: US Abdomen Complete  06/06/2011 IMPRESSION: Heterogeneous thickened pancreatic body and head with enlargement of the pancreatic duct and a trace of peripancreatic fluid is suspicious for the presence of underlying pancreatitis.  Further evaluation with abdominal CT with contrast would be helpful for improved characterization.  Findings suggestive of moderate hepatic steatosis.  These results will be called to the ordering clinician or representative by the Radiologist Assistant, and communication documented in the PACS Dashboard.  Original Report Authenticated By: Bertha Stakes, M.D.   Ct Abdomen Pelvis W Contrast  06/10/2011   IMPRESSION: Mild pancreatitis without complicating features.  Original Report Authenticated By: Rosendo Gros, M.D.    Medications: Scheduled Meds:   . aspirin EC  81 mg Oral Daily  . clopidogrel  75 mg Oral Q breakfast  . iohexol  20 mL Oral Q1 Hr x 2  . potassium chloride  40 mEq Oral BID  . simvastatin  20 mg Oral q1800  . sodium chloride  3 mL Intravenous Q12H  . DISCONTD: carvedilol  6.25 mg Oral  BID WC   Continuous Infusions:    Assessment/Plan: Principal Problem:  *Pancreatitis, acute - Placed back on clear liquid diet today, was not able to tolerate regular diet, repeat lipase in a.m. and advance diet slowly - Continue pain control, IV fluids  Active Problems: Hypokalemia: Replaced   HYPERLIPIDEMIA-MIXED: Continue Zocor   HYPERTENSION: Stable   CAD, MI/V fib arrest + angioplasty 2000, PTCA and BM stent LAD 2009: - Continue aspirin, beta blocker, Plavix   TIA in Oct 2007: Continue aspirin and Plavix  DVT Prophylaxis: SCDs  Code Status: Full code  Disposition: Hopefully in next 1 or 2 days,  once tolerating a regular diet   LOS: 6 days   Aanyah Loa M.D. [facility] 06/11/2011, 1:10 PM Pager: [phone]

## 2011-06-11 NOTE — ED Provider Notes (Signed)
I saw and evaluated the patient, reviewed the resident's note and I agree with the findings and plan.   Kjirsten Bloodgood, MD 06/11/11 1429 

## 2011-06-12 LAB — GLUCOSE, CAPILLARY
Glucose-Capillary: 106 mg/dL — ABNORMAL HIGH (ref 70–99)
Glucose-Capillary: 91 mg/dL (ref 70–99)

## 2011-06-12 LAB — LIPASE, BLOOD: Lipase: 141 U/L — ABNORMAL HIGH (ref 11–59)

## 2011-06-12 MED ORDER — BISACODYL 10 MG RE SUPP
10.0000 mg | Freq: Once | RECTAL | Status: AC
  Administered 2011-06-12: 10 mg via RECTAL
  Filled 2011-06-12: qty 1

## 2011-06-12 MED ORDER — POLYETHYLENE GLYCOL 3350 17 G PO PACK
17.0000 g | PACK | Freq: Once | ORAL | Status: AC
  Administered 2011-06-12: 17 g via ORAL
  Filled 2011-06-12: qty 1

## 2011-06-12 NOTE — Progress Notes (Signed)
    CARE MANAGEMENT NOTE 06/12/2011  Patient:  Lindsey [name], Lindsey [name]   Account Number:  [id number]  Date Initiated:  06/12/2011  Documentation initiated by:  Darlyne Russian  Subjective/Objective Assessment:   Patient admitted with abdominal pain     Action/Plan:   Patient lives at home with mom   Anticipated DC Date:  06/14/2011   Anticipated DC Plan:  HOME/SELF CARE      DC Planning Services  CM consult      Choice offered to / List presented to:             Status of service:  In process, will continue to follow Medicare Important Message given?   (If response is "NO", the following Medicare IM given date fields will be blank) Date Medicare IM given:   Date Additional Medicare IM given:    Discharge Disposition:    Per UR Regulation:    If discussed at Long Length of Stay Meetings, dates discussed:    Comments:  PCP: Dr Daleen Squibb  06/12/2011 [address] [place], [place]  [id number] Met with patient to discuss discharge planning. She lives at home with her mom. Her PCP is at [facility], she has never been to the office. Her follow up is with Dr Daleen Squibb cardioogy.  She gets her medications at [facility]  CM to continue to follow for discharge planning.

## 2011-06-12 NOTE — Progress Notes (Signed)
Patient ID: Lindsey [name]  female  ZOX:[id number]    DOB: 1970-12-30    DOA: 06/05/2011  PCP: Valera Castle, MD, MD  Subjective: Tolerating clear liquids fine, abdominal pain improving  Objective: Weight change:   Intake/Output Summary (Last 24 hours) at 06/12/11 1351 Last data filed at 06/12/11 1300  Gross per 24 hour  Intake 3095.34 ml  Output   4651 ml  Net -1555.66 ml   Blood pressure 114/77, pulse 70, temperature 98 F (36.7 C), temperature source Oral, resp. rate 18, height 5\' 6"  (1.676 m), weight 83.825 kg (184 lb 12.8 oz), last menstrual period 05/30/2011, SpO2 99.00%.  Physical Exam: General: Alert and awake, oriented x3, not in any acute distress. HEENT: anicteric sclera, pupils reactive to light and accommodation, EOMI CVS: S1-S2 clear, no murmur rubs or gallops Chest: clear to auscultation bilaterally, no wheezing, rales or rhonchi Abdomen: Mild Left upper quadrant tenderness to deep palpation  nondistended, normal bowel sounds, no organomegaly Extremities: no cyanosis, clubbing or edema noted bilaterally Neuro: Cranial nerves II-XII intact, no focal neurological deficits  Lab Results: Basic Metabolic Panel:  Lab 06/11/11 8119 06/10/11 0533  NA 139 140  K 3.4* 2.8*  CL 101 102  CO2 31 30  GLUCOSE 112* 99  BUN <3* <3*  CREATININE 0.65 0.63  CALCIUM 9.4 8.9  MG -- --  PHOS -- --   Liver Function Tests:  Lab 06/10/11 0533 06/08/11 0515  AST 27 25  ALT 28 26  ALKPHOS 105 89  BILITOT 0.4 0.6  PROT 5.7* 5.5*  ALBUMIN 2.5* 2.6*    Lab 06/12/11 0625 06/11/11 1043  LIPASE 141* 208*  AMYLASE -- --   CBC:  Lab 06/07/11 0630 06/06/11 0543 06/05/11 1533  WBC 9.3 7.9 --  NEUTROABS -- -- 11.7*  HGB 10.4* 11.7* --  HCT 30.8* 35.7* --  MCV 90.6 93.0 --  PLT 123* 130* --   Cardiac Enzymes:  Lab 06/05/11 2228 06/05/11 1536  CKTOTAL 66 --  CKMB 1.5 --  CKMBINDEX -- --  TROPONINI <0.30 <0.30   CBG:  Lab 06/12/11 1150 06/12/11 0654 06/11/11 2105  06/11/11 0555 06/10/11 2114  GLUCAP 95 77 106* 96 96     Micro Results: No results found for this or any previous visit (from the past 240 hour(s)).  Studies/Results: US Abdomen Complete  06/06/2011 IMPRESSION: Heterogeneous thickened pancreatic body and head with enlargement of the pancreatic duct and a trace of peripancreatic fluid is suspicious for the presence of underlying pancreatitis.  Further evaluation with abdominal CT with contrast would be helpful for improved characterization.  Findings suggestive of moderate hepatic steatosis.  These results will be called to the ordering clinician or representative by the Radiologist Assistant, and communication documented in the PACS Dashboard.  Original Report Authenticated By: Bertha Stakes, M.D.   Ct Abdomen Pelvis W Contrast  06/10/2011   IMPRESSION: Mild pancreatitis without complicating features.  Original Report Authenticated By: Rosendo Gros, M.D.    Medications: Scheduled Meds:    . aspirin EC  81 mg Oral Daily  . bisacodyl  10 mg Rectal Once  . clopidogrel  75 mg Oral Q breakfast  . polyethylene glycol  17 g Oral Once  . potassium chloride  40 mEq Oral BID  . simvastatin  20 mg Oral q1800  . sodium chloride  3 mL Intravenous Q12H   Continuous Infusions:    . sodium chloride 100 mL/hr (06/12/11 1024)     Assessment/Plan: Principal Problem:  *  Pancreatitis, acute: Lipase improved to 141 today - Advance diet to full liquids, repeat lipase in a.m. and advance diet slowly - Continue pain control, IV fluids  Active Problems:   HYPERLIPIDEMIA-MIXED: Continue Zocor   HYPERTENSION: Stable   CAD, MI/V fib arrest + angioplasty 2000, PTCA and BM stent LAD 2009: - Continue aspirin, beta blocker, Plavix   TIA in Oct 2007: Continue aspirin and Plavix  DVT Prophylaxis: SCDs  Code Status: Full code  Disposition: Hopefully in next 1 or 2 days, once tolerating a regular diet   LOS: 7 days   Clement Deneault  M.D. [facility] 06/12/2011, 1:51 PM Pager: [phone]

## 2011-06-13 LAB — GLUCOSE, CAPILLARY: Glucose-Capillary: 79 mg/dL (ref 70–99)

## 2011-06-13 LAB — BASIC METABOLIC PANEL
CO2: 28 mEq/L (ref 19–32)
Calcium: 8.9 mg/dL (ref 8.4–10.5)
Glucose, Bld: 78 mg/dL (ref 70–99)
Sodium: 141 mEq/L (ref 135–145)

## 2011-06-13 MED ORDER — TRAMADOL HCL 50 MG PO TABS: 50.0000 mg | ORAL_TABLET | Freq: Four times a day (QID) | ORAL | Status: AC | PRN

## 2011-06-13 MED ORDER — POLYETHYLENE GLYCOL 3350 17 G PO PACK: 17.0000 g | PACK | Freq: Every day | ORAL | Status: AC | PRN

## 2011-06-13 MED ORDER — PROMETHAZINE HCL 12.5 MG PO TABS: 12.5000 mg | ORAL_TABLET | Freq: Four times a day (QID) | ORAL | Status: DC | PRN

## 2011-06-13 MED ORDER — POLYETHYLENE GLYCOL 3350 17 G PO PACK
17.0000 g | PACK | Freq: Every day | ORAL | Status: DC
  Administered 2011-06-13: 17 g via ORAL
  Filled 2011-06-13: qty 1

## 2011-06-13 NOTE — Progress Notes (Signed)
Went over all D/C instructions with pt. Given prescriptions exit care notes on pancreatitis, follow up visits which pt needs to schedule due to holiday. Discharged per w/c per RN. Pt has all belongings. Family to drive pt home. Marisa Cyphers RN

## 2011-06-13 NOTE — Discharge Summary (Signed)
Physician Discharge Summary  Patient ID: Lindsey [name] MRN: [id number] DOB/AGE: [birth year] 41 y.o.  Admit date: 06/05/2011 Discharge date: 06/13/2011  Primary Care Physician:  Valera Castle, MD, MD  Discharge Diagnoses:    .Pancreatitis, acute .HYPERLIPIDEMIA-MIXED .CAD, MI/V fib arrest + angioplasty 2000, PTCA and BM stent LAD 2009 .HYPERTENSION Constipation  Consults:  None   Discharge Medications: Medication List  As of 06/13/2011  2:30 PM   TAKE these medications         acetaminophen 500 MG tablet   Commonly known as: TYLENOL   Take 1,000 mg by mouth every 6 (six) hours as needed. For pain      aspirin 81 MG tablet   Take 81 mg by mouth daily.      carvedilol 6.25 MG tablet   Commonly known as: COREG   Take 6.25 mg by mouth 2 (two) times daily with a meal.      clopidogrel 75 MG tablet   Commonly known as: PLAVIX   Take 75 mg by mouth daily.      polyethylene glycol packet   Commonly known as: MIRALAX / GLYCOLAX   Take 17 g by mouth daily as needed (for constipation).      promethazine 12.5 MG tablet   Commonly known as: PHENERGAN   Take 1 tablet (12.5 mg total) by mouth every 6 (six) hours as needed for nausea.      simvastatin 20 MG tablet   Commonly known as: ZOCOR   Take 20 mg by mouth every evening.      traMADol 50 MG tablet   Commonly known as: ULTRAM   Take 1 tablet (50 mg total) by mouth every 6 (six) hours as needed for pain.             Brief H and P: For complete details please refer to admission H and P, but in brief patient is a 41 year old female with history of CAD status post stent, history of stroke, antithrombin III deficiency, hyperlipidemia presented with abdominal pain for last 5 days prior to admission. Patient had a binge drinking of alcohol 6 days ago and from that day on she started developing epigastric pain and progressively worsening to the point that she was unable to keep anything down. Patient had pancreatitis in October  2012 and was attributed to alcoholism.  Hospital Course:   Pancreatitis, acute: Lipase was 1685 at the time of admission likely secondary to alcohol binging. Patient was admitted and placed on n.p.o. status, IV fluids, pain control. She underwent abdominal ultrasound which confirmed the underlying pancreatitis but no gallstones, gallbladder wall thickening or pericholecystic fluid. Lipase had improved to 115 on the day of discharge, the patient is tolerating low-fat solid diet, ambulating in the hallway without any difficulty. The bladder pain has significantly improved. Patient was strongly counseled to stop alcohol use and was explained the consequences of possibly having recurrent pancreatitis, pancreatic insufficiency, pseudocyst if she continued alcohol. Lipid panel was checked patient did not have any hypertriglyceridemia, triglyceride level of 71.    HYPERLIPIDEMIA-MIXED: Continue Zocor   HYPERTENSION: Remained stable  CAD, MI/V fib arrest + angioplasty 2000, PTCA and BM stent LAD 2009: Remained stable, no acute cardiac issues, patient was continued on aspirin, beta blocker, Plavix   TIA in Oct 2007: Patient was continued on aspirin and Plavix   Day of Discharge BP 116/79  Pulse 76  Temp(Src) 97.5 F (36.4 C) (Oral)  Resp 20  Ht 5\' 6"  (1.676 m)  Wt 84.188 kg (185 lb 9.6 oz)  BMI 29.96 kg/m2  SpO2 98%  LMP 05/30/2011  Physical Exam: General: Alert and awake oriented x3 not in any acute distress. HEENT: anicteric sclera, pupils reactive to light and accommodation CVS: S1-S2 clear no murmur rubs or gallops Chest: clear to auscultation bilaterally, no wheezing rales or rhonchi Abdomen: soft nontender, nondistended, normal bowel sounds, no organomegaly Extremities: no cyanosis, clubbing or edema noted bilaterally Neuro: Cranial nerves II-XII intact, no focal neurological deficits   The results of significant diagnostics from this hospitalization (including imaging,  microbiology, ancillary and laboratory) are listed below for reference.    LAB RESULTS: Basic Metabolic Panel:  Lab 06/13/11 1610 06/11/11 1043  NA 141 139  K 4.0 3.4*  CL 108 101  CO2 28 31  GLUCOSE 78 112*  BUN <3* <3*  CREATININE 0.64 0.65  CALCIUM 8.9 9.4  MG -- --  PHOS -- --   Liver Function Tests:  Lab 06/10/11 0533 06/08/11 0515  AST 27 25  ALT 28 26  ALKPHOS 105 89  BILITOT 0.4 0.6  PROT 5.7* 5.5*  ALBUMIN 2.5* 2.6*    Lab 06/13/11 0546 06/12/11 0625  LIPASE 115* 141*  AMYLASE -- --   CBC:  Lab 06/07/11 0630  WBC 9.3  NEUTROABS --  HGB 10.4*  HCT 30.8*  MCV 90.6  PLT 123*  CBG:  Lab 06/13/11 1255 06/13/11 0556  GLUCAP 129* 79    Significant Diagnostic Studies:  US Abdomen Complete  06/06/2011  *RADIOLOGY REPORT*  Clinical Data:  Pain with nausea and vomiting.  Increased lipase and ALT  COMPLETE ABDOMINAL ULTRASOUND  Comparison:  CT 12/26/2010  Findings:  Gallbladder:  No gallstones, gallbladder wall thickening, or pericholecystic fluid. Evaluation for a sonographic Murphy's sign is negative  Common bile duct:  Measures 6 mm in diameter  Liver:  Is increased in echotexture and demonstrates mild diffuse heterogeneity compatible with underlying moderate hepatic steatosis.  No definite focal parenchymal abnormalities or signs of intrahepatic ductal dilatation are identified  IVC:  The proximal portion appears normal  Pancreas:  Only the pancreatic head and body can be seen due to shadowing of the tail by overlying gas.  But the head and body appear thickened and heterogeneous in echotexture are with somewhat poor definition of the borders.  The pancreatic duct is enlargedmeasuring 4.9 mm in diameter in the region of the pancreatic head and proximal portion of the body. A trace ofperipancreatic fluid is seen.  No loculated collections are identified.  Given the elevated lipase and ALT, the appearance is suspicious for pancreatitis  Spleen:  Demonstrates a length  of 3.3 cm.  No focal parenchymal abnormality is seen  Right Kidney:  Has a sagittal length of 11.2 cm.  No focal parenchymal abnormalities or signs of hydronephrosis are seen  Left Kidney:  Demonstrates a sagittal length of 10.3 cm.  No focal parenchymal abnormalities or signs of hydronephrosis are evident  Abdominal aorta:  Has a maximal caliber of 2.0 cm with no aneurysmal dilatation seen.  IMPRESSION: Heterogeneous thickened pancreatic body and head with enlargement of the pancreatic duct and a trace of peripancreatic fluid is suspicious for the presence of underlying pancreatitis.  Further evaluation with abdominal CT with contrast would be helpful for improved characterization.  Findings suggestive of moderate hepatic steatosis.  These results will be called to the ordering clinician or representative by the Radiologist Assistant, and communication documented in the PACS Dashboard.  Original Report Authenticated By:  Bertha Stakes, M.D.     Disposition and Follow-up: Discharge Orders    Future Orders Please Complete By Expires   Diet - low sodium heart healthy      Increase activity slowly      Discharge instructions      Comments:   LOW FAT DIET, no alcohol       DISPOSITION: Home   DIET: Low-fat diet   ACTIVITY: Activity as tolerated   DISCHARGE FOLLOW-UP Follow-up Information    Follow up with Valera Castle, MD. Schedule an appointment as soon as possible for a visit in 2 weeks. (for hospital follow-up)          Time spent on Discharge: 45 minutes  Signed:  Shevelle Smither M.D. [facility] 06/13/2011, 2:30 PM

## 2011-06-13 NOTE — Plan of Care (Signed)
Problem: Phase III Progression Outcomes Goal: Tolerating diet Outcome: Adequate for Discharge Pt advanced diet today already tolerated 2 meals at advanced level.

## 2011-06-13 NOTE — Plan of Care (Signed)
Problem: Discharge Progression Outcomes Goal: Amylase, WBC trending downward Outcome: Completed/Met Date Met:  06/13/11 Lipase trending down

## 2011-06-16 NOTE — Progress Notes (Signed)
   CARE MANAGEMENT NOTE 06/16/2011  Patient:  Lindsey [name], Lindsey [name]   Account Number:  [id number]  Date Initiated:  06/12/2011  Documentation initiated by:  Darlyne Russian  Subjective/Objective Assessment:   Patient admitted with abdominal pain     Action/Plan:   Patient lives at home with mom   Anticipated DC Date:  06/14/2011   Anticipated DC Plan:  HOME/SELF CARE      DC Planning Services  CM consult      Choice offered to / List presented to:             Status of service:  Completed, signed off Medicare Important Message given?   (If response is "NO", the following Medicare IM given date fields will be blank) Date Medicare IM given:   Date Additional Medicare IM given:    Discharge Disposition:  HOME/SELF CARE  Per UR Regulation:    If discussed at Long Length of Stay Meetings, dates discussed:    Comments:  PCP: Dr Daleen Squibb  06/12/2011 [address] [place], [place]  [id number] Met with patient to discuss discharge planning. She lives at home with her mom. Her PCP is at [facility], she has never been to the office. Her follow up is with Dr Daleen Squibb cardioogy.  She gets her medications at [facility].  CM to continue to follow for discharge planning.

## 2011-06-28 ENCOUNTER — Observation Stay (HOSPITAL_COMMUNITY)
Admission: EM | Admit: 2011-06-28 | Discharge: 2011-06-30 | Disposition: A | Discharge status: Home / Self Care | Payer: Self-pay | Attending: Internal Medicine | Admitting: Internal Medicine

## 2011-06-28 ENCOUNTER — Encounter (HOSPITAL_COMMUNITY): Payer: Self-pay | Admitting: *Deleted

## 2011-06-28 DIAGNOSIS — M79606 Pain in leg, unspecified: Secondary | ICD-10-CM | POA: Diagnosis present

## 2011-06-28 DIAGNOSIS — M542 Cervicalgia: Principal | ICD-10-CM | POA: Insufficient documentation

## 2011-06-28 DIAGNOSIS — Z9861 Coronary angioplasty status: Secondary | ICD-10-CM | POA: Insufficient documentation

## 2011-06-28 DIAGNOSIS — R0989 Other specified symptoms and signs involving the circulatory and respiratory systems: Secondary | ICD-10-CM | POA: Insufficient documentation

## 2011-06-28 DIAGNOSIS — R29898 Other symptoms and signs involving the musculoskeletal system: Secondary | ICD-10-CM | POA: Insufficient documentation

## 2011-06-28 DIAGNOSIS — R079 Chest pain, unspecified: Secondary | ICD-10-CM | POA: Insufficient documentation

## 2011-06-28 DIAGNOSIS — I10 Essential (primary) hypertension: Secondary | ICD-10-CM | POA: Diagnosis present

## 2011-06-28 DIAGNOSIS — R202 Paresthesia of skin: Secondary | ICD-10-CM | POA: Diagnosis present

## 2011-06-28 DIAGNOSIS — I252 Old myocardial infarction: Secondary | ICD-10-CM | POA: Insufficient documentation

## 2011-06-28 DIAGNOSIS — I2510 Atherosclerotic heart disease of native coronary artery without angina pectoris: Secondary | ICD-10-CM | POA: Insufficient documentation

## 2011-06-28 DIAGNOSIS — F411 Generalized anxiety disorder: Secondary | ICD-10-CM | POA: Insufficient documentation

## 2011-06-28 DIAGNOSIS — R0609 Other forms of dyspnea: Secondary | ICD-10-CM | POA: Insufficient documentation

## 2011-06-28 DIAGNOSIS — Z8673 Personal history of transient ischemic attack (TIA), and cerebral infarction without residual deficits: Secondary | ICD-10-CM | POA: Insufficient documentation

## 2011-06-28 NOTE — ED Notes (Signed)
She has had some rt leg numbness and pain for 2 weeks .  She is c/o lt neck stiffness and some numbness of her lt arm and neck for 3 days.  No known injury.Marland Kitchen

## 2011-06-29 ENCOUNTER — Inpatient Hospital Stay (HOSPITAL_COMMUNITY): Payer: Self-pay

## 2011-06-29 ENCOUNTER — Encounter (HOSPITAL_COMMUNITY): Payer: Self-pay | Admitting: Internal Medicine

## 2011-06-29 ENCOUNTER — Emergency Department (HOSPITAL_COMMUNITY): Payer: Self-pay

## 2011-06-29 DIAGNOSIS — M79606 Pain in leg, unspecified: Secondary | ICD-10-CM | POA: Diagnosis present

## 2011-06-29 DIAGNOSIS — M79609 Pain in unspecified limb: Secondary | ICD-10-CM

## 2011-06-29 DIAGNOSIS — R202 Paresthesia of skin: Secondary | ICD-10-CM | POA: Diagnosis present

## 2011-06-29 DIAGNOSIS — R079 Chest pain, unspecified: Secondary | ICD-10-CM | POA: Diagnosis present

## 2011-06-29 LAB — RETICULOCYTES: Retic Ct Pct: 1.6 % (ref 0.4–3.1)

## 2011-06-29 LAB — POCT I-STAT, CHEM 8
BUN: 3 mg/dL — ABNORMAL LOW (ref 6–23)
Calcium, Ion: 1.21 mmol/L (ref 1.12–1.32)
HCT: 39 % (ref 36.0–46.0)
TCO2: 25 mmol/L (ref 0–100)

## 2011-06-29 LAB — BASIC METABOLIC PANEL
BUN: 5 mg/dL — ABNORMAL LOW (ref 6–23)
CO2: 26 mEq/L (ref 19–32)
Calcium: 9.6 mg/dL (ref 8.4–10.5)
Glucose, Bld: 97 mg/dL (ref 70–99)
Sodium: 139 mEq/L (ref 135–145)

## 2011-06-29 LAB — CARDIAC PANEL(CRET KIN+CKTOT+MB+TROPI)
CK, MB: 1.2 ng/mL (ref 0.3–4.0)
CK, MB: 1.4 ng/mL (ref 0.3–4.0)
Relative Index: INVALID (ref 0.0–2.5)
Relative Index: INVALID (ref 0.0–2.5)
Total CK: 66 U/L (ref 7–177)
Total CK: 57 U/L (ref 7–177)
Total CK: 67 U/L (ref 7–177)

## 2011-06-29 LAB — LIPASE, BLOOD: Lipase: 60 U/L — ABNORMAL HIGH (ref 11–59)

## 2011-06-29 LAB — CBC
Hemoglobin: 11.6 g/dL — ABNORMAL LOW (ref 12.0–15.0)
MCH: 29.5 pg (ref 26.0–34.0)
MCHC: 32.4 g/dL (ref 30.0–36.0)
MCV: 91.1 fL (ref 78.0–100.0)
Platelets: 243 10*3/uL (ref 150–400)
RBC: 3.93 MIL/uL (ref 3.87–5.11)

## 2011-06-29 LAB — GLUCOSE, CAPILLARY: Glucose-Capillary: 108 mg/dL — ABNORMAL HIGH (ref 70–99)

## 2011-06-29 LAB — RPR: RPR Ser Ql: NONREACTIVE

## 2011-06-29 LAB — FERRITIN: Ferritin: 56 ng/mL (ref 10–291)

## 2011-06-29 LAB — HCG, QUANTITATIVE, PREGNANCY: hCG, Beta Chain, Quant, S: <1 m[IU]/mL (ref <5)

## 2011-06-29 LAB — MAGNESIUM: Magnesium: 1.8 mg/dL (ref 1.5–2.5)

## 2011-06-29 LAB — IRON AND TIBC
Saturation Ratios: 28 % (ref 20–55)
TIBC: 290 ug/dL (ref 250–470)

## 2011-06-29 LAB — DIFFERENTIAL
Eosinophils Absolute: 0.5 10*3/uL (ref 0.0–0.7)
Eosinophils Relative: 5 % (ref 0–5)
Lymphs Abs: 2.2 10*3/uL (ref 0.7–4.0)
Monocytes Relative: 6 % (ref 3–12)

## 2011-06-29 LAB — POCT I-STAT TROPONIN I: Troponin i, poc: 0 ng/mL (ref 0.00–0.08)

## 2011-06-29 MED ORDER — HYDROCODONE-ACETAMINOPHEN 5-325 MG PO TABS
1.0000 | ORAL_TABLET | ORAL | Status: DC | PRN
  Administered 2011-06-30: 1 via ORAL
  Filled 2011-06-29: qty 1

## 2011-06-29 MED ORDER — DEXTROSE 5 % IV SOLN
1000.0000 mg | Freq: Once | INTRAVENOUS | Status: AC
  Administered 2011-06-29: 1000 mg via INTRAVENOUS
  Filled 2011-06-29: qty 10

## 2011-06-29 MED ORDER — NITROGLYCERIN 0.4 MG SL SUBL: 0.4000 mg | SUBLINGUAL_TABLET | SUBLINGUAL | Status: DC | PRN

## 2011-06-29 MED ORDER — ONDANSETRON HCL 4 MG/2ML IJ SOLN: 4.0000 mg | Freq: Four times a day (QID) | INTRAMUSCULAR | Status: DC | PRN

## 2011-06-29 MED ORDER — ASPIRIN 81 MG PO CHEW
81.0000 mg | CHEWABLE_TABLET | Freq: Every day | ORAL | Status: DC
Start: 2011-06-29 — End: 2011-06-30
  Administered 2011-06-29 – 2011-06-30 (×2): 81 mg via ORAL
  Filled 2011-06-29 (×2): qty 1

## 2011-06-29 MED ORDER — ONDANSETRON HCL 4 MG/2ML IJ SOLN: 4.0000 mg | Freq: Three times a day (TID) | INTRAMUSCULAR | Status: DC | PRN

## 2011-06-29 MED ORDER — CLOPIDOGREL BISULFATE 75 MG PO TABS
75.0000 mg | ORAL_TABLET | Freq: Every day | ORAL | Status: DC
  Administered 2011-06-29 – 2011-06-30 (×2): 75 mg via ORAL
  Filled 2011-06-29 (×2): qty 1

## 2011-06-29 MED ORDER — ALUM & MAG HYDROXIDE-SIMETH 200-200-20 MG/5ML PO SUSP: 30.0000 mL | Freq: Four times a day (QID) | ORAL | Status: DC | PRN

## 2011-06-29 MED ORDER — ACETAMINOPHEN 650 MG RE SUPP: 650.0000 mg | Freq: Four times a day (QID) | RECTAL | Status: DC | PRN

## 2011-06-29 MED ORDER — IOHEXOL 350 MG/ML SOLN
100.0000 mL | Freq: Once | INTRAVENOUS | Status: AC | PRN
  Administered 2011-06-29: 100 mL via INTRAVENOUS

## 2011-06-29 MED ORDER — POTASSIUM CHLORIDE 20 MEQ/15ML (10%) PO LIQD
40.0000 meq | Freq: Once | ORAL | Status: AC
  Administered 2011-06-29: 40 meq via ORAL
  Filled 2011-06-29: qty 30

## 2011-06-29 MED ORDER — SODIUM CHLORIDE 0.9 % IJ SOLN
3.0000 mL | Freq: Two times a day (BID) | INTRAMUSCULAR | Status: DC
  Administered 2011-06-29 – 2011-06-30 (×3): 3 mL via INTRAVENOUS

## 2011-06-29 MED ORDER — ACETAMINOPHEN 325 MG PO TABS: 650.0000 mg | ORAL_TABLET | Freq: Four times a day (QID) | ORAL | Status: DC | PRN

## 2011-06-29 MED ORDER — DOCUSATE SODIUM 100 MG PO CAPS
100.0000 mg | ORAL_CAPSULE | Freq: Two times a day (BID) | ORAL | Status: DC
  Administered 2011-06-29 – 2011-06-30 (×3): 100 mg via ORAL
  Filled 2011-06-29 (×4): qty 1

## 2011-06-29 MED ORDER — SODIUM CHLORIDE 0.9 % IV SOLN
Freq: Once | INTRAVENOUS | Status: AC
Start: 2011-06-29 — End: 2011-06-29
  Administered 2011-06-29: 01:00:00 via INTRAVENOUS

## 2011-06-29 MED ORDER — ALBUTEROL SULFATE (5 MG/ML) 0.5% IN NEBU: 2.5000 mg | INHALATION_SOLUTION | RESPIRATORY_TRACT | Status: DC | PRN

## 2011-06-29 MED ORDER — LORAZEPAM 2 MG/ML IJ SOLN
1.0000 mg | Freq: Once | INTRAMUSCULAR | Status: AC
  Administered 2011-06-29: 1 mg via INTRAVENOUS
  Filled 2011-06-29: qty 1

## 2011-06-29 MED ORDER — CARVEDILOL 6.25 MG PO TABS
6.2500 mg | ORAL_TABLET | Freq: Two times a day (BID) | ORAL | Status: DC
  Administered 2011-06-29 – 2011-06-30 (×4): 6.25 mg via ORAL
  Filled 2011-06-29 (×5): qty 1

## 2011-06-29 MED ORDER — TRAMADOL HCL 50 MG PO TABS
50.0000 mg | ORAL_TABLET | Freq: Four times a day (QID) | ORAL | Status: DC | PRN
  Administered 2011-06-29 – 2011-06-30 (×3): 50 mg via ORAL
  Filled 2011-06-29 (×3): qty 1

## 2011-06-29 MED ORDER — METHOCARBAMOL 100 MG/ML IJ SOLN
1000.0000 mg | Freq: Once | INTRAMUSCULAR | Status: DC
  Filled 2011-06-29: qty 10

## 2011-06-29 MED ORDER — ASPIRIN 81 MG PO CHEW
324.0000 mg | CHEWABLE_TABLET | Freq: Once | ORAL | Status: AC
  Administered 2011-06-29: 324 mg via ORAL
  Filled 2011-06-29: qty 4

## 2011-06-29 MED ORDER — SODIUM CHLORIDE 0.9 % IV SOLN
INTRAVENOUS | Status: AC
  Administered 2011-06-29: 09:00:00 via INTRAVENOUS

## 2011-06-29 MED ORDER — GUAIFENESIN-DM 100-10 MG/5ML PO SYRP: 5.0000 mL | ORAL_SOLUTION | ORAL | Status: DC | PRN

## 2011-06-29 MED ORDER — POTASSIUM CHLORIDE CRYS ER 20 MEQ PO TBCR
40.0000 meq | EXTENDED_RELEASE_TABLET | Freq: Once | ORAL | Status: AC
  Administered 2011-06-29: 40 meq via ORAL
  Filled 2011-06-29: qty 2

## 2011-06-29 MED ORDER — ONDANSETRON HCL 4 MG PO TABS: 4.0000 mg | ORAL_TABLET | Freq: Four times a day (QID) | ORAL | Status: DC | PRN

## 2011-06-29 MED ORDER — SIMVASTATIN 20 MG PO TABS
20.0000 mg | ORAL_TABLET | Freq: Every evening | ORAL | Status: DC
  Administered 2011-06-29 – 2011-06-30 (×2): 20 mg via ORAL
  Filled 2011-06-29 (×2): qty 1

## 2011-06-29 NOTE — ED Provider Notes (Signed)
History     CSN: [id number]  Arrival date & time 06/28/11  2232   First MD Initiated Contact with Patient 06/29/11 0024      Chief Complaint  Patient presents with  . stiff neck     (Consider location/radiation/quality/duration/timing/severity/associated sxs/prior treatment) HPI Comments: Recent episode of pancreatitis that required 9 day admission - has recovered fully - when she was here had a pain in the R leg, in the posterior leg.  Over the last couple of weeks, this has increased in intensity, and is now moving up  The leg to the buttock and now has spread to the L buttock and hamstring.  Now having pain in the shoulder on the L.  Having some numbness in the L arm at the top of the arm.  Numbness stops proximal to the elbow.  Hand is normal, no numbness tingling or weakness.  No sx in the R arm.  Toes on the R feel numb.  Hurts in the upper chest when she takes a big breath.  Was gradual in onset, persistent and now that not taking the pain medicines for pancreatitis it is getting worse.  Admits to having some weakness in the R leg.    Pt also admits to having increased CP - described as worse with breathing, some pressure similar to prior cardiac pain.  Has one stent  Has no hx of neck pain, back pain.    Hx of CAD and CVA X 2, has AT3 disease hypercoaguable state - was on coumadin in the past - now on plavix.   Stroke in the past left her with lack of peripheral vision in R eye, 1st stroke had expressive aphasia but resolved spontaneously.  Denies swelling of legs, fevers or rashes.  No coughing or SOB.    Cards:  Dr. Daleen Squibb  The history is provided by the patient and medical records.    Past Medical History  Diagnosis Date  . Stroke   . Coronary artery disease   . Myocardial infarct   . Blood transfusion   . Anxiety     Past Surgical History  Procedure Date  . Tubal ligation   . Cardiac catheterization     No family history on file.  History  Substance Use  Topics  . Smoking status: Former Smoker -- 0.5 packs/day for 10 years    Types: Cigarettes    Quit date: 06/04/2009  . Smokeless tobacco: Not on file  . Alcohol Use: 4.2 oz/week    7 Cans of beer per week     about 1 beer/day    OB History    Grav Para Term Preterm Abortions TAB SAB Ect Mult Living                  Review of Systems  All other systems reviewed and are negative.    Allergies  Review of patient's allergies indicates no known allergies.  Home Medications   Current Outpatient Rx  Name Route Sig Dispense Refill  . ASPIRIN 81 MG PO TABS Oral Take 81 mg by mouth daily.    Marland Kitchen CARVEDILOL 6.25 MG PO TABS Oral Take 6.25 mg by mouth 2 (two) times daily with a meal.    . CLOPIDOGREL BISULFATE 75 MG PO TABS Oral Take 75 mg by mouth daily.    Marland Kitchen SIMVASTATIN 20 MG PO TABS Oral Take 20 mg by mouth every evening.    Marland Kitchen TRAMADOL HCL 50 MG PO TABS Oral Take 50  mg by mouth every 6 (six) hours as needed. For pain    . PROMETHAZINE HCL 12.5 MG PO TABS Oral Take 1 tablet (12.5 mg total) by mouth every 6 (six) hours as needed for nausea. 30 tablet 0    BP 134/92  Pulse 77  Temp(Src) 98.8 F (37.1 C) (Oral)  Resp 14  SpO2 100%  LMP 05/30/2011  Physical Exam  Nursing note and vitals reviewed. Constitutional: She appears well-developed and well-nourished. No distress.  HENT:  Head: Normocephalic and atraumatic.  Mouth/Throat: Oropharynx is clear and moist. No oropharyngeal exudate.  Eyes: Conjunctivae and EOM are normal. Pupils are equal, round, and reactive to light. Right eye exhibits no discharge. Left eye exhibits no discharge. No scleral icterus.  Neck: Normal range of motion. Neck supple. No JVD present. No thyromegaly present.  Cardiovascular: Normal rate, regular rhythm, normal heart sounds and intact distal pulses.  Exam reveals no gallop and no friction rub.   No murmur heard. Pulmonary/Chest: Effort normal and breath sounds normal. No respiratory distress. She has  no wheezes. She has no rales.  Abdominal: Soft. Bowel sounds are normal. She exhibits no distension and no mass. There is no tenderness.  Musculoskeletal: Normal range of motion. She exhibits tenderness ( pain with ROM of the RLE, no edema, no swelling, no asymetry.  pain with ROM of the L shoulder). She exhibits no edema.  Lymphadenopathy:    She has no cervical adenopathy.  Neurological: She is alert. Coordination normal.       Neurologic exam:  Speech clear, pupils equal round reactive to light, extraocular movements intact  Normal peripheral visual fields Cranial nerves III through XII normal including no facial droop Follows commands, moves all extremities x4, normal strength to bilateral upper and lower extremities at all major muscle groups including grip Sensation normal to light touch and pinprick Coordination intact, no limb ataxia, finger-nose-finger normal Rapid alternating movements normal No pronator drift Gait normal   Skin: Skin is warm and dry. No rash noted. No erythema.  Psychiatric: She has a normal mood and affect. Her behavior is normal.    ED Course  Procedures (including critical care time)  ED ECG REPORT   Date: 06/29/2011   Rate: 68  Rhythm: normal sinus rhythm  QRS Axis: normal  Intervals: normal  ST/T Wave abnormalities: normal  Conduction Disutrbances:none  Narrative Interpretation: anterior Q waves  Old EKG Reviewed: unchanged c/w 12/23/10, no schange other than slower rate  Labs Reviewed  BASIC METABOLIC PANEL - Abnormal; Notable for the following:    BUN 5 (*)    All other components within normal limits  CBC - Abnormal; Notable for the following:    Hemoglobin 11.6 (*)    HCT 35.8 (*)    All other components within normal limits  LIPASE, BLOOD - Abnormal; Notable for the following:    Lipase 60 (*)    All other components within normal limits  POCT I-STAT, CHEM 8 - Abnormal; Notable for the following:    Potassium 3.4 (*)    BUN 3 (*)      All other components within normal limits  DIFFERENTIAL  APTT  PROTIME-INR  CARDIAC PANEL(CRET KIN+CKTOT+MB+TROPI)  POCT I-STAT TROPONIN I   Ct Angio Chest W/cm &/or Wo Cm  06/29/2011  *RADIOLOGY REPORT*  Clinical Data: Left-sided chest pain radiating to left neck and arm.  Right leg pain.  Previous myocardial infarct.  CT ANGIOGRAPHY CHEST  Technique:  Multidetector CT imaging of the chest  using the standard protocol during bolus administration of intravenous contrast. Multiplanar reconstructed images including MIPs were obtained and reviewed to evaluate the vascular anatomy.  Contrast: OMNIPAQUE IOHEXOL 350 MG/ML SOLN  Comparison: 06/26/2007  Findings: Satisfactory opacification of the pulmonary arteries noted, and there is no evidence of pulmonary emboli.  No evidence of thoracic aortic aneurysm or dissection.  No evidence of mediastinal hematoma or mass.  No evidence of pleural or pericardial effusion.  Both lungs are clear.  No evidence of central endobronchial lesion.  No lymphadenopathy identified within the thorax.  IMPRESSION: Negative.  No evidence of pulmonary embolism or other active disease.  Original Report Authenticated By: Danae Orleans, M.D.   Dg Chest Port 1 View  06/29/2011  *RADIOLOGY REPORT*  Clinical Data: Chest pain and shortness of breath.  Previous myocardial infarcts and strokes.  CHEST - 1 VIEW  Comparison:  12/23/2010  Findings: The heart size and mediastinal contours are within normal limits.  Both lungs are clear.  IMPRESSION: No active disease.  Original Report Authenticated By: Danae Orleans, M.D.     1. Chest pain       MDM  Normal neuro exam, has normal sounding heart sounds and lungs sounds but s/s c/w possible ACS or PE.  Labs, ECG ordered, ASA, reeval.  LUE sx likely MSK secondary to compensating for leg pain for last couple of weeks.   Has neg labs, ongoing mild CP - CTA neg, admitted to hospitalist      Vida Roller, MD 06/29/11 [phone]

## 2011-06-29 NOTE — ED Notes (Signed)
Warm compresses provided for pt's shoulder and neck.

## 2011-06-29 NOTE — ED Notes (Signed)
Patient transported to CT 

## 2011-06-29 NOTE — Progress Notes (Signed)
*  PRELIMINARY RESULTS* Vascular Ultrasound Lower extremity venous duplex has been completed.  Preliminary findings: Bilaterally no evidence of DVT or baker's cyst.  Farrel Demark , RDMS    06/29/2011, 10:18 AM

## 2011-06-29 NOTE — H&P (Signed)
PCP:   Valera Castle, MD, MD    Chief Complaint:   Chest pain  HPI: Lindsey [name] is a 41 y.o. female   has a past medical history of Stroke; Coronary artery disease; Myocardial infarct; Blood transfusion; and Anxiety.   Presented with  Multiple symptoms including chest pain and leg pain since her last admission. She was hospitalized at [facility] with pancreatitis 2 weeks ago. Patient states while she was hospitalized she developed pulling like sensation in back of her right leg. This has progressed and now she has tingling and numbness of the toes and ball in her right leg. She has had trouble ambulating. She developed left side of the neck pain that feels like a pulled muscle as well as the pain spreading across offered chest which is reproducible by palpation and also appeared to be musculoskeletal. She does state that she had sudden development of numbness of her left arm as well. Patient states his pain is not reminiscent of her heart attack. He does it hurt to take a deep breath. She hasn't had any shortness of breath associated. She hasn't had any leg swelling no strength deficits.  Review of Systems: chest pain, leg pain, tingling,   Pertinent positives include:  Constitutional:  No weight loss, night sweats, Fevers, chills, fatigue, weight loss  HEENT:  No headaches, Difficulty swallowing,Tooth/dental problems,Sore throat,  No sneezing, itching, ear ache, nasal congestion, post nasal drip,  Cardio-vascular:  No Orthopnea, PND, anasarca, dizziness, palpitations.no Bilateral lower extremity swelling  GI:  No heartburn, indigestion, abdominal pain, nausea, vomiting, diarrhea, change in bowel habits, loss of appetite, melena, blood in stool, hematemesis Resp:  no shortness of breath at rest. No dyspnea on exertion, No excess mucus, no productive cough, No non-productive cough, No coughing up of blood.No change in color of mucus.No wheezing. Skin:  no rash or lesions. No jaundice GU:    no dysuria, change in color of urine, no urgency or frequency. No straining to urinate.  No flank pain.  Musculoskeletal:  No joint pain or no joint swelling. No decreased range of motion. No back pain.  Psych:  No change in mood or affect. No depression or anxiety. No memory loss.  Neuro: no localizing neurological complaints, no  no weakness, no double vision, no gait abnormality, no slurred speech, no confusion  Otherwise ROS are negative except for above, 10 systems were reviewed  Past Medical History: Past Medical History  Diagnosis Date  . Stroke   . Coronary artery disease   . Myocardial infarct   . Blood transfusion   . Anxiety    Past Surgical History  Procedure Date  . Tubal ligation   . Cardiac catheterization      Medications: Prior to Admission medications   Medication Sig Start Date End Date Taking? Authorizing Provider  aspirin 81 MG tablet Take 81 mg by mouth daily.   Yes Historical Provider, MD  carvedilol (COREG) 6.25 MG tablet Take 6.25 mg by mouth 2 (two) times daily with a meal.   Yes Historical Provider, MD  clopidogrel (PLAVIX) 75 MG tablet Take 75 mg by mouth daily.   Yes Historical Provider, MD  simvastatin (ZOCOR) 20 MG tablet Take 20 mg by mouth every evening.   Yes Historical Provider, MD  traMADol (ULTRAM) 50 MG tablet Take 50 mg by mouth every 6 (six) hours as needed. For pain   Yes Historical Provider, MD  promethazine (PHENERGAN) 12.5 MG tablet Take 1 tablet (12.5 mg total) by  mouth every 6 (six) hours as needed for nausea. 06/13/11 06/20/11  Ripudeep Jenna Luo, MD    Allergies:  No Known Allergies  Social History:  Ambulatory independently Lives at  home   reports that she quit smoking about 2 years ago. Her smoking use included Cigarettes. She has a 5 pack-year smoking history. She does not have any smokeless tobacco history on file. She reports that she drinks about 4.2 ounces of alcohol per week. She reports that she does not use illicit  drugs.   Family History: family history is not on file.    Physical Exam: Patient Vitals for the past 24 hrs:  BP Temp Temp src Pulse Resp SpO2  06/29/11 0345 134/92 mmHg - - 77  14  100 %  06/29/11 0245 135/97 mmHg - - - 19  -  06/29/11 0145 133/86 mmHg - - - 17  -  06/28/11 2236 133/85 mmHg 98.8 F (37.1 C) Oral 95  18  100 %    1. General:  in No Acute distress 2. Psychological: Alert and  Oriented 3. Head/ENT:   Moist  Mucous Membranes                          Head Non traumatic, neck supple, patient is able to move her neck well but there is a muscle spasm noted on the left which is improved with gentle massage.                          Normal Dentition 4. SKIN: normal  Skin turgor,  Skin clean Dry and intact no rash 5. Heart: Regular rate and rhythm no Murmur, Rub or gallop, chest wall tenderness worse with palpation  6. Lungs: Clear to auscultation bilaterally, no wheezes or crackles   7. Abdomen: Soft, non-tender, Non distended 8. Lower extremities: no clubbing, cyanosis, or edema 9. Neurologically Grossly intact, moving all 4 extremities equally, strength 5 out of 5 in all 4 extremities cranial nerves II through XII intact patient is not endorse any sensation deficits 10. MSK: Normal range of motion, pain in the neck and left arm reproducible by motion  body mass index is unknown because there is no height or weight on file.   Labs on Admission:   [facility] 06/29/11 0129 06/29/11 0112  NA 141 139  K 3.4* 3.5  CL 106 104  CO2 -- 26  GLUCOSE 99 97  BUN 3* 5*  CREATININE 0.80 0.70  CALCIUM -- 9.6  MG -- --  PHOS -- --   No results found for this basename: AST:2,ALT:2,ALKPHOS:2,BILITOT:2,PROT:2,ALBUMIN:2 in the last 72 hours  Basename 06/29/11 0112  LIPASE 60*  AMYLASE --    Basename 06/29/11 0129 06/29/11 0112  WBC -- 10.1  NEUTROABS -- 6.7  HGB 13.3 11.6*  HCT 39.0 35.8*  MCV -- 91.1  PLT -- 243    Basename 06/29/11 0112  CKTOTAL 66  CKMB 1.2    CKMBINDEX --  TROPONINI <0.30   No results found for this basename: TSH,T4TOTAL,FREET3,T3FREE,THYROIDAB in the last 72 hours No results found for this basename: VITAMINB12:2,FOLATE:2,FERRITIN:2,TIBC:2,IRON:2,RETICCTPCT:2 in the last 72 hours Lab Results  Component Value Date   HGBA1C  Value: 4.8 (NOTE)  According to the ADA Clinical Practice Recommendations for 2011, when HbA1c is used as a screening test:   >=6.5%   Diagnostic of Diabetes Mellitus           (if abnormal result  is confirmed)  5.7-6.4%   Increased risk of developing Diabetes Mellitus  References:Diagnosis and Classification of Diabetes Mellitus,Diabetes Care,2011,34(Suppl 1):S62-S69 and Standards of Medical Care in         Diabetes - 2011,Diabetes Care,2011,34  (Suppl 1):S11-S61. 01/22/2010    The CrCl is unknown because both a height and weight (above a minimum accepted value) are required for this calculation. ABG    Component Value Date/Time   TCO2 25 06/29/2011 0129     Lab Results  Component Value Date   DDIMER  Value: 0.93        AT THE INHOUSE ESTABLISHED CUTOFF VALUE OF 0.48 ug/mL FEU, THIS ASSAY HAS BEEN DOCUMENTED IN THE LITERATURE TO HAVE A SENSITIVITY AND NEGATIVE PREDICTIVE VALUE OF AT LEAST 98 TO 99%.  THE TEST RESULT SHOULD BE CORRELATED WITH AN ASSESSMENT OF THE CLINICAL PROBABILITY OF DVT / VTE.* 05/02/2009     Other results:  I have pearsonaly reviewed this: ECG REPORT  Rate: 68   Rhythm: NSR ST&T Change: non specific no ischemia   Lipase 60    Cultures:    Component Value Date/Time   SDES URINE, CLEAN CATCH 09/25/2010 2220   SPECREQUEST NONE 09/25/2010 2220   CULT NO GROWTH 09/25/2010 2220   REPTSTATUS 09/26/2010 FINAL 09/25/2010 2220       Radiological Exams on Admission: Ct Angio Chest W/cm &/or Wo Cm  06/29/2011  *RADIOLOGY REPORT*  Clinical Data: Left-sided chest pain radiating to left neck and arm.  Right leg pain.   Previous myocardial infarct.  CT ANGIOGRAPHY CHEST  Technique:  Multidetector CT imaging of the chest using the standard protocol during bolus administration of intravenous contrast. Multiplanar reconstructed images including MIPs were obtained and reviewed to evaluate the vascular anatomy.  Contrast: OMNIPAQUE IOHEXOL 350 MG/ML SOLN  Comparison: 06/26/2007  Findings: Satisfactory opacification of the pulmonary arteries noted, and there is no evidence of pulmonary emboli.  No evidence of thoracic aortic aneurysm or dissection.  No evidence of mediastinal hematoma or mass.  No evidence of pleural or pericardial effusion.  Both lungs are clear.  No evidence of central endobronchial lesion.  No lymphadenopathy identified within the thorax.  IMPRESSION: Negative.  No evidence of pulmonary embolism or other active disease.  Original Report Authenticated By: Danae Orleans, M.D.   Dg Chest Port 1 View  06/29/2011  *RADIOLOGY REPORT*  Clinical Data: Chest pain and shortness of breath.  Previous myocardial infarcts and strokes.  CHEST - 1 VIEW  Comparison:  12/23/2010  Findings: The heart size and mediastinal contours are within normal limits.  Both lungs are clear.  IMPRESSION: No active disease.  Original Report Authenticated By: Danae Orleans, M.D.    Assessment/Plan  This is a 41 year old female with history of strokes and heart attacks in the past presents with very atypical chest pain. And nonspecific neurological complaints.  Present on Admission:  .Chest pain - this is very atypical and I think musculoskeletal in origin but given risk factors will admit, monitor on telemetry, cycle cardiac enzymes, obtain serial ECG. obtain TSH. Make sure patient is on Aspirin. Further treatment based on the currently pending results.  CT angiogram of the chest negative for pulmonary embolism  .HYPERTENSION - continue home medications  .CAD,  MI/V fib arrest + angioplasty 2000, PTCA and BM stent LAD 2009 patient is  doing of her Plavix and aspirin we'll continue   .Leg pain very atypical we'll check for DVT and Doppler  .Paresthesias  - for symptoms of a vague she does have history of CVAs in the past but this would be very atypical presentation. Would check B12 level. Replace electrolytes. Although I believe that CVA somewhat unlikely given her typical presentation given the past history of CVA we'll obtain MRI MRA positive would consider other workup  Prophylaxis: SCD  Protonix    I have spent a total of 65 min on this admission  Jishnu Jenniges 06/29/2011, 4:37 AM

## 2011-06-29 NOTE — ED Notes (Addendum)
PT reports throbbing in right leg radiating to thigh, left shoulder, left leg, and left side of back. Deep inspiration on left side hurts; pt reports numbness in right leg and left arm. This pain started when leaving hopsital for 9 day stent for pacreatitis. Pt concerned for blood clots. Reports no redding or swelling in legs noted.

## 2011-06-29 NOTE — Progress Notes (Signed)
Reviewed chart, saw and examined Ms Sibilia in the presence of her husband. Added tsh/rpr/mg to tests requested earlier. Gave kcl/ativan(for claustrophobia). Will continue rest of orders per Dr Adela Glimpse.  Mahima Hottle,MD pager#[phone].

## 2011-06-30 LAB — CBC
Hemoglobin: 10.6 g/dL — ABNORMAL LOW (ref 12.0–15.0)
MCH: 29.9 pg (ref 26.0–34.0)
Platelets: 217 10*3/uL (ref 150–400)
RBC: 3.54 MIL/uL — ABNORMAL LOW (ref 3.87–5.11)
WBC: 6.6 10*3/uL (ref 4.0–10.5)

## 2011-06-30 LAB — LIPID PANEL
Cholesterol: 155 mg/dL (ref 0–200)
HDL: 48 mg/dL (ref >39)
Total CHOL/HDL Ratio: 3.2 RATIO
Triglycerides: 93 mg/dL (ref <150)

## 2011-06-30 LAB — GLUCOSE, CAPILLARY

## 2011-06-30 LAB — COMPREHENSIVE METABOLIC PANEL
BUN: 5 mg/dL — ABNORMAL LOW (ref 6–23)
CO2: 25 mEq/L (ref 19–32)
Calcium: 9.2 mg/dL (ref 8.4–10.5)
Chloride: 107 mEq/L (ref 96–112)
Creatinine, Ser: 0.69 mg/dL (ref 0.50–1.10)
GFR calc Af Amer: >90 mL/min (ref >90)
GFR calc non Af Amer: >90 mL/min (ref >90)
Glucose, Bld: 104 mg/dL — ABNORMAL HIGH (ref 70–99)
Total Bilirubin: 0.3 mg/dL (ref 0.3–1.2)

## 2011-06-30 MED ORDER — SIMVASTATIN 20 MG PO TABS: 20.0000 mg | ORAL_TABLET | Freq: Every evening | ORAL | Status: DC

## 2011-06-30 MED ORDER — CARVEDILOL 6.25 MG PO TABS: 6.2500 mg | ORAL_TABLET | Freq: Two times a day (BID) | ORAL | Status: DC

## 2011-06-30 MED ORDER — MAGNESIUM SULFATE 40 MG/ML IJ SOLN
2.0000 g | Freq: Once | INTRAMUSCULAR | Status: DC
  Filled 2011-06-30 (×2): qty 50

## 2011-06-30 MED ORDER — ASPIRIN 81 MG PO TABS: 81.0000 mg | ORAL_TABLET | Freq: Every day | ORAL | Status: DC

## 2011-06-30 MED ORDER — TRAMADOL HCL 50 MG PO TABS: 50.0000 mg | ORAL_TABLET | Freq: Four times a day (QID) | ORAL | Status: DC | PRN

## 2011-06-30 MED ORDER — CLOPIDOGREL BISULFATE 75 MG PO TABS: 75.0000 mg | ORAL_TABLET | Freq: Every day | ORAL | Status: DC

## 2011-06-30 MED ORDER — MAGNESIUM OXIDE 400 MG PO TABS
800.0000 mg | ORAL_TABLET | Freq: Once | ORAL | Status: AC
  Administered 2011-06-30: 800 mg via ORAL
  Filled 2011-06-30: qty 2

## 2011-06-30 NOTE — Progress Notes (Signed)
D/c orders received;IV removed with gauze on, pt remains in stable condition, pt meds and instructions reviewed and given to pt; reviewed s/s of TIA/CVA, stroke d/c instructions given to pt; pt d/c to home

## 2011-06-30 NOTE — Progress Notes (Signed)
Utilization Review Completed.Lindsey [name] T4/15/2013   

## 2011-06-30 NOTE — Evaluation (Signed)
Physical Therapy Evaluation Patient Details Name: Lindsey [name] MRN: [id number] DOB: 08/01/1970 Today's Date: 06/30/2011  Problem List:  Patient Active Problem List  Diagnoses  . HYPERLIPIDEMIA-MIXED  . OBESITY-MORBID (>100')  . HYPERTENSION  . MYOCARDIAL INFARCTION, ANTEROLATERAL WALL, INITIAL EPISODE  . CAD, MI/V fib arrest + angioplasty 2000, PTCA and BM stent LAD 2009  . TIA in Oct 2007  . Pancreatitis, acute  . Chest pain  . Leg pain  . Paresthesias    Past Medical History:  Past Medical History  Diagnosis Date  . Stroke   . Coronary artery disease   . Myocardial infarct   . Blood transfusion   . Anxiety    Past Surgical History:  Past Surgical History  Procedure Date  . Tubal ligation   . Cardiac catheterization     PT Assessment/Plan/Recommendation PT Assessment Clinical Impression Statement: Patient presents with insidious onset of lower extremity tingling and pain and left sided cervical/thoracic pain that inhibits her functional efficiency and left shoulder/cervical ROM. Patient is modified indep. with all mobility and does not require PTin the acute care setting. However, patient would benefit from K-Pad for heat to neck area to decrease pain, and she may also benefit from outpatient PT if testing of cervical./thoracic area confirms a musculoskeletal issue.   PT Recommendation/Assessment: All further PT needs can be met in the next venue of care PT Problem List: Pain;Decreased range of motion No Skilled PT: Patient is modified independent with all activity/mobility PT Recommendation Follow Up Recommendations: Outpatient PT Equipment Recommended: None recommended by PT  PT Evaluation Precautions/Restrictions  Precautions Precautions: None Prior Functioning  Home Living Lives With: Family (mother and 2 daughters) Available Help at Discharge: Family;Available PRN/intermittently Type of Home: House Home Access: Stairs to enter [employer] of  Steps: 2 Entrance Stairs-Rails: Can reach both;Left;Right Home Layout: One level Bathroom Shower/Tub: Engineer, manufacturing systems: Standard Home Adaptive Equipment: None Prior Function Level of Independence: Independent Able to Take Stairs?: Yes Driving: Yes Vocation: Unemployed Financial risk analyst Arousal/Alertness: Awake/alert Overall Cognitive Status: Appears within functional limits for tasks assessed Orientation Level: Oriented X4 Sensation/Coordination Sensation Light Touch: Appears Intact (but constant tingling feeling both legs ) Proprioception: Appears Intact Coordination Gross Motor Movements are Fluid and Coordinated: Yes Fine Motor Movements are Fluid and Coordinated: Yes Extremity Assessment RUE Assessment RUE Assessment: Within Functional Limits RLE Assessment RLE Assessment: Within Functional Limits LLE Assessment LLE Assessment: Within Functional Limits Mobility (including Balance) Bed Mobility Bed Mobility: Yes Supine to Sit: 6: Modified independent (Device/Increase time) Sitting - Scoot to Edge of Bed: 6: Modified independent (Device/Increase time) Sit to Supine: 6: Modified independent (Device/Increase time) Scooting to [facility]: 6: Modified independent (Device/Increase time) Transfers Transfers: Yes Sit to Stand: 6: Modified independent (Device/Increase time);From bed Stand to Sit: 6: Modified independent (Device/Increase time);To bed Ambulation/Gait Ambulation/Gait: Yes Ambulation/Gait Assistance: 6: Modified independent (Device/Increase time) Ambulation Distance (Feet): 250 Feet Assistive device: None Gait Pattern: Within Functional Limits Gait velocity: Decreased secondary to pain Stairs: Yes Stairs Assistance: 6: Modified independent (Device/Increase time) Stair Management Technique: One rail Left;Step to pattern Number of Stairs: 3   Posture/Postural Control Posture/Postural Control: No significant limitations   End of Session PT - End of  Session Equipment Utilized During Treatment: Gait belt Activity Tolerance: Patient limited by pain Patient left: in bed;with call bell in reach Nurse Communication: Mobility status for ambulation General Behavior During Session: [facility] for tasks performed Cognition: [facility] for tasks performed Edwyna Perfect, PT  Pager [phone]  06/30/2011,  10:38 AM

## 2011-06-30 NOTE — Evaluation (Signed)
Occupational Therapy Evaluation Patient Details Name: Lindsey [name] MRN: [id number] DOB: 1970/05/01 Today's Date: 06/30/2011  Problem List:  Patient Active Problem List  Diagnoses  . HYPERLIPIDEMIA-MIXED  . OBESITY-MORBID (>100')  . HYPERTENSION  . MYOCARDIAL INFARCTION, ANTEROLATERAL WALL, INITIAL EPISODE  . CAD, MI/V fib arrest + angioplasty 2000, PTCA and BM stent LAD 2009  . TIA in Oct 2007  . Pancreatitis, acute  . Chest pain  . Leg pain  . Paresthesias    Past Medical History:  Past Medical History  Diagnosis Date  . Stroke   . Coronary artery disease   . Myocardial infarct   . Blood transfusion   . Anxiety    Past Surgical History:  Past Surgical History  Procedure Date  . Tubal ligation   . Cardiac catheterization     OT Assessment/Plan/Recommendation OT Assessment Clinical Impression Statement: Pt presents to OT with decreased I with ADL activity due to pain in L shoulder.  Pt is able to complete ADL activity safetly, but does need extra time due to pain. OT Recommendation/Assessment: All further OT needs can be met in the next venue of care OT Problem List: Decreased strength;Decreased range of motion;Decreased activity tolerance OT Therapy Diagnosis : Generalized weakness OT Recommendation Follow Up Recommendations: Outpatient OT Equipment Recommended: None recommended by PT Individuals Consulted Consulted and Agree with Results and Recommendations: Patient     OT Evaluation Precautions/Restrictions  Precautions Precautions: None Restrictions Weight Bearing Restrictions: No Prior Functioning Home Living Lives With: Family (mother and 2 daughters) Available Help at Discharge: Family;Available PRN/intermittently Type of Home: House Home Access: Stairs to enter [employer] of Steps: 2 Entrance Stairs-Rails: Can reach both;Left;Right Home Layout: One level Bathroom Shower/Tub: Engineer, manufacturing systems: Standard Home Adaptive  Equipment: None Prior Function Level of Independence: Independent Able to Take Stairs?: Yes Driving: Yes Vocation: Unemployed  ADL ADL ADL Comments: Pt doing well with ADL activity. Educated pt on donning left arm first in her shirt since L side is painful with movement. Observed pt reach top of head, scratch back and hold arm against graavity. Pt sats pain is move with activity.  Pt is able to peform all ADL actiivity, but does take extra time.  Vision/Perception  Vision - History Baseline Vision: No visual deficits Cognition Cognition Arousal/Alertness: Awake/alert Overall Cognitive Status: Appears within functional limits for tasks assessed Orientation Level: Oriented X4 Sensation/Coordination Sensation Light Touch: Appears Intact (but constant tingling feeling both legs ) Proprioception: Appears Intact Coordination Gross Motor Movements are Fluid and Coordinated: Yes Fine Motor Movements are Fluid and Coordinated: Yes Extremity Assessment RUE Assessment RUE Assessment: Within Functional Limits LUE Assessment LUE Assessment: Exceptions to [facility] (AROM WFL, yet movement is slow and painful per pt) Mobility  Bed Mobility Bed Mobility: Yes Supine to Sit: 6: Modified independent (Device/Increase time) Sitting - Scoot to Edge of Bed: 6: Modified independent (Device/Increase time) Sit to Supine: 6: Modified independent (Device/Increase time) Scooting to [facility]: 6: Modified independent (Device/Increase time) Transfers Sit to Stand: 6: Modified independent (Device/Increase time);From bed Stand to Sit: 6: Modified independent (Device/Increase time);To bed    End of Session OT - End of Session Activity Tolerance: Patient limited by pain Patient left: in bed;with call bell in reach;with bed alarm set General Behavior During Session: [facility] for tasks performed Cognition: [facility] for tasks performed   Jevante Hollibaugh, [place] 06/30/2011, 2:06 PM

## 2011-06-30 NOTE — Discharge Summary (Signed)
DISCHARGE SUMMARY  Lindsey [name]  MR#: [id number]  DOB:Dec 05, 1970  Date of Admission: 06/28/2011 Date of Discharge: 06/30/2011  Attending Physician:Aria Jarrard  Patient's OZH:[id number] Wall, MD, MD  Consults: none.  Discharge Diagnoses: Present on Admission:  .HYPERTENSION .CAD, MI/V fib arrest + angioplasty 2000, PTCA and BM stent LAD 2009 .Chest pain .Leg pain .Paresthesias   Hospital Course: This pleasant 41 year old female was admitted with complaints of generalized pains, associated with paraesthesias of the lower extremities. She complained of non specific chest and neck pains. CTA of chest was negative, so was brain MRI( which showed an old CVA involving the left PCA territory. Vitamin b12/tsh/rpr was negative. She had some mild hypokalemia/hypomagnesemia, probably related to alcoholism. It appears like [name] symptoms may be non organic- ?anxiety disorder/depression. 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 This may be the stressor behind the difficult to narrow symptoms. If she has an organic process, it would be neurological, potentially involving the spinal cord. I do not see anything to suggest this at present. I have encouraged her to establish primary care, potentially with the [facility] as she sees Dr wall for her cardiac issues, to explore all avenues in a systematic manner if her symptoms persist. She is stable for d/c.   Medication List  As of 06/30/2011  3:29 PM   TAKE these medications         aspirin 81 MG tablet   Take 81 mg by mouth daily.      carvedilol 6.25 MG tablet   Commonly known as: COREG   Take 6.25 mg by mouth 2 (two) times daily with a meal.      clopidogrel 75 MG tablet   Commonly known as: PLAVIX   Take 75 mg by mouth daily.      promethazine 12.5 MG tablet   Commonly known as: PHENERGAN   Take 1 tablet (12.5 mg total) by mouth every 6 (six)  hours as needed for nausea.      simvastatin 20 MG tablet   Commonly known as: ZOCOR   Take 20 mg by mouth every evening.      traMADol 50 MG tablet   Commonly known as: ULTRAM   Take 50 mg by mouth every 6 (six) hours as needed. For pain             Day of Discharge BP 113/87  Pulse 79  Temp(Src) 97.5 F (36.4 C) (Oral)  Resp 17  Ht 5\' 6"  (1.676 m)  Wt 84.188 kg (185 lb 9.6 oz)  BMI 29.96 kg/m2  SpO2 100%  LMP 05/30/2011  Physical Exam: Normal.  Results for orders placed during the hospital encounter of 06/28/11 (from the past 24 hour(s))  GLUCOSE, CAPILLARY     Status: Abnormal   Collection Time   06/29/11  4:55 PM      Component Value Range   Glucose-Capillary 108 (*) 70 - 99 (mg/dL)  CARDIAC PANEL(CRET KIN+CKTOT+MB+TROPI)     Status: Normal   Collection Time   06/29/11  8:07 PM      Component Value Range   Total CK 67  7 - 177 (U/L)   CK, MB 1.5  0.3 - 4.0 (ng/mL)   Troponin I <0.30  <0.30 (ng/mL)   Relative Index RELATIVE INDEX IS INVALID  0.0 - 2.5   MAGNESIUM     Status: Normal  Collection Time   06/30/11  6:15 AM      Component Value Range   Magnesium 1.8  1.5 - 2.5 (mg/dL)  PHOSPHORUS     Status: Normal   Collection Time   06/30/11  6:15 AM      Component Value Range   Phosphorus 4.3  2.3 - 4.6 (mg/dL)  TSH     Status: Normal   Collection Time   06/30/11  6:15 AM      Component Value Range   TSH 2.237  0.350 - 4.500 (uIU/mL)  COMPREHENSIVE METABOLIC PANEL     Status: Abnormal   Collection Time   06/30/11  6:15 AM      Component Value Range   Sodium 141  135 - 145 (mEq/L)   Potassium 3.9  3.5 - 5.1 (mEq/L)   Chloride 107  96 - 112 (mEq/L)   CO2 25  19 - 32 (mEq/L)   Glucose, Bld 104 (*) 70 - 99 (mg/dL)   BUN 5 (*) 6 - 23 (mg/dL)   Creatinine, Ser 1.61  0.50 - 1.10 (mg/dL)   Calcium 9.2  8.4 - 09.6 (mg/dL)   Total Protein 6.1  6.0 - 8.3 (g/dL)   Albumin 2.8 (*) 3.5 - 5.2 (g/dL)   AST 13  0 - 37 (U/L)   ALT 10  0 - 35 (U/L)   Alkaline  Phosphatase 68  39 - 117 (U/L)   Total Bilirubin 0.3  0.3 - 1.2 (mg/dL)   GFR calc non Af Amer >90  >90 (mL/min)   GFR calc Af Amer >90  >90 (mL/min)  CBC     Status: Abnormal   Collection Time   06/30/11  6:15 AM      Component Value Range   WBC 6.6  4.0 - 10.5 (K/uL)   RBC 3.54 (*) 3.87 - 5.11 (MIL/uL)   Hemoglobin 10.6 (*) 12.0 - 15.0 (g/dL)   HCT 04.5 (*) 40.9 - 46.0 (%)   MCV 91.0  78.0 - 100.0 (fL)   MCH 29.9  26.0 - 34.0 (pg)   MCHC 32.9  30.0 - 36.0 (g/dL)   RDW 81.1  91.4 - 78.2 (%)   Platelets 217  150 - 400 (K/uL)  LIPID PANEL     Status: Normal   Collection Time   06/30/11  6:15 AM      Component Value Range   Cholesterol 155  0 - 200 (mg/dL)   Triglycerides 93  <956 (mg/dL)   HDL 48  >21 (mg/dL)   Total CHOL/HDL Ratio 3.2     VLDL 19  0 - 40 (mg/dL)   LDL Cholesterol 88  0 - 99 (mg/dL)  GLUCOSE, CAPILLARY     Status: Abnormal   Collection Time   06/30/11 11:46 AM      Component Value Range   Glucose-Capillary 154 (*) 70 - 99 (mg/dL)   Comment 1 Notify RN      Disposition: home today.   Follow-up Appts: Discharge Orders    Future Orders Please Complete By Expires   Diet - low sodium heart healthy      Increase activity slowly         Follow-up Information    Follow up with Valera Castle, MD .          Time spent in discharge (includes decision making & examination of pt): 15 minutes  Signed: Jahzeel Poythress 06/30/2011, 3:29 PM

## 2011-06-30 NOTE — Discharge Instructions (Signed)
STROKE/TIA DISCHARGE INSTRUCTIONS SMOKING Cigarette smoking nearly doubles your risk of having a stroke & is the single most alterable risk factor  If you smoke or have smoked in the last 12 months, you are advised to quit smoking for your health.  Most of the excess cardiovascular risk related to smoking disappears within a year of stopping.  Ask you doctor about anti-smoking medications  [place] Quit Line: 1-800-QUIT NOW  Free Smoking Cessation Classes [phone]  CHOLESTEROL Know your levels; limit fat & cholesterol in your diet  Lipid Panel     Component Value Date/Time   CHOL 155 06/30/2011 0615   TRIG 93 06/30/2011 0615   HDL 48 06/30/2011 0615   CHOLHDL 3.2 06/30/2011 0615   VLDL 19 06/30/2011 0615   LDLCALC 88 06/30/2011 0615      Many patients benefit from treatment even if their cholesterol is at goal.  Goal: Total Cholesterol (CHOL) less than 160  Goal:  Triglycerides (TRIG) less than 150  Goal:  HDL greater than 40  Goal:  LDL (LDLCALC) less than 100   BLOOD PRESSURE American Stroke Association blood pressure target is less that 120/80 mm/Hg  Your discharge blood pressure is:  BP: 113/87 mmHg  Monitor your blood pressure  Limit your salt and alcohol intake  Many individuals will require more than one medication for high blood pressure  DIABETES (A1c is a blood sugar average for last 3 months) Goal HGBA1c is under 7% (HBGA1c is blood sugar average for last 3 months)  Diabetes: No known diagnosis of diabetes    Lab Results  Component Value Date   HGBA1C  Value: 4.8 (NOTE)                                                                       According to the ADA Clinical Practice Recommendations for 2011, when HbA1c is used as a screening test:   >=6.5%   Diagnostic of Diabetes Mellitus           (if abnormal result  is confirmed)  5.7-6.4%   Increased risk of developing Diabetes Mellitus  References:Diagnosis and Classification of Diabetes Mellitus,Diabetes  Care,2011,34(Suppl 1):S62-S69 and Standards of Medical Care in         Diabetes - 2011,Diabetes Care,2011,34  (Suppl 1):S11-S61. 01/22/2010     Your HGBA1c can be lowered with medications, healthy diet, and exercise.  Check your blood sugar as directed by your physician  Call your physician if you experience unexplained or low blood sugars.  PHYSICAL ACTIVITY/REHABILITATION Goal is 30 minutes at least 4 days per week  Activity: Increase activity slowly, Therapies:  Return to work: ***  Activity decreases your risk of heart attack and stroke and makes your heart stronger.  It helps control your weight and blood pressure; helps you relax and can improve your mood.  Participate in a regular exercise program.  Talk with your doctor about the best form of exercise for you (dancing, walking, swimming, cycling).  DIET/WEIGHT Goal is to maintain a healthy weight  Your discharge diet is: Cardiac *** liquids Your height is:  Height: 5\' 6"  (167.6 cm) Your current weight is: Weight: 84.188 kg (185 lb 9.6 oz) Your Body Mass Index (BMI) is:  BMI (  Calculated): 30   Following the type of diet specifically designed for you will help prevent another stroke.  Your goal weight range is:  ***  Your goal Body Mass Index (BMI) is 19-24.  Healthy food habits can help reduce 3 risk factors for stroke:  High cholesterol, hypertension, and excess weight.  RESOURCES Stroke/Support Group:  Call [phone]   STROKE EDUCATION PROVIDED/REVIEWED AND GIVEN TO PATIENT Stroke warning signs and symptoms How to activate emergency medical system (call 911). Medications prescribed at discharge. Need for follow-up after discharge. Personal risk factors for stroke. Pneumonia vaccine given: No Flu vaccine given: No My questions have been answered, the writing is legible, and I understand these instructions.  I will adhere to these goals & educational materials that have been provided to me after my discharge from the  hospital.

## 2011-12-23 ENCOUNTER — Ambulatory Visit (HOSPITAL_COMMUNITY)
Admission: EM | Admit: 2011-12-23 | Discharge: 2011-12-24 | Disposition: A | Discharge status: Home / Self Care | Payer: 59 | Attending: Cardiovascular Disease | Admitting: Cardiovascular Disease

## 2011-12-23 ENCOUNTER — Emergency Department (HOSPITAL_COMMUNITY): Payer: Self-pay

## 2011-12-23 ENCOUNTER — Encounter (HOSPITAL_COMMUNITY): Payer: Self-pay | Admitting: Emergency Medicine

## 2011-12-23 DIAGNOSIS — I10 Essential (primary) hypertension: Secondary | ICD-10-CM | POA: Insufficient documentation

## 2011-12-23 DIAGNOSIS — Z9861 Coronary angioplasty status: Secondary | ICD-10-CM | POA: Insufficient documentation

## 2011-12-23 DIAGNOSIS — R0602 Shortness of breath: Secondary | ICD-10-CM | POA: Insufficient documentation

## 2011-12-23 DIAGNOSIS — R0789 Other chest pain: Secondary | ICD-10-CM | POA: Insufficient documentation

## 2011-12-23 DIAGNOSIS — R079 Chest pain, unspecified: Secondary | ICD-10-CM

## 2011-12-23 DIAGNOSIS — I2510 Atherosclerotic heart disease of native coronary artery without angina pectoris: Secondary | ICD-10-CM | POA: Insufficient documentation

## 2011-12-23 HISTORY — DX: Anemia, unspecified: D64.9

## 2011-12-23 HISTORY — DX: Tobacco use: Z72.0

## 2011-12-23 HISTORY — DX: Other primary thrombophilia: D68.59

## 2011-12-23 LAB — BASIC METABOLIC PANEL
BUN: 6 mg/dL (ref 6–23)
CO2: 22 mEq/L (ref 19–32)
Chloride: 102 mEq/L (ref 96–112)
Creatinine, Ser: 0.8 mg/dL (ref 0.50–1.10)
Glucose, Bld: 98 mg/dL (ref 70–99)
Potassium: 4 mEq/L (ref 3.5–5.1)

## 2011-12-23 LAB — CBC
HCT: 39.8 % (ref 36.0–46.0)
Hemoglobin: 13.5 g/dL (ref 12.0–15.0)
MCV: 86.7 fL (ref 78.0–100.0)
WBC: 6.2 10*3/uL (ref 4.0–10.5)

## 2011-12-23 LAB — AMYLASE: Amylase: 88 U/L (ref 0–105)

## 2011-12-23 LAB — PROTIME-INR: INR: 1.04 (ref 0.00–1.49)

## 2011-12-23 LAB — TROPONIN I: Troponin I: <0.3 ng/mL (ref <0.30)

## 2011-12-23 LAB — APTT: aPTT: 29 seconds (ref 24–37)

## 2011-12-23 LAB — POCT I-STAT TROPONIN I

## 2011-12-23 LAB — LIPASE, BLOOD: Lipase: 25 U/L (ref 11–59)

## 2011-12-23 MED ORDER — SODIUM CHLORIDE 0.9 % IJ SOLN: 3.0000 mL | Freq: Two times a day (BID) | INTRAMUSCULAR | Status: DC

## 2011-12-23 MED ORDER — NITROGLYCERIN IN D5W 200-5 MCG/ML-% IV SOLN
2.0000 ug/min | INTRAVENOUS | Status: DC
  Administered 2011-12-23: 5 ug/min via INTRAVENOUS
  Filled 2011-12-23: qty 250

## 2011-12-23 MED ORDER — CLOPIDOGREL BISULFATE 75 MG PO TABS
75.0000 mg | ORAL_TABLET | Freq: Every day | ORAL | Status: DC
  Administered 2011-12-24: 75 mg via ORAL
  Filled 2011-12-23 (×2): qty 1

## 2011-12-23 MED ORDER — ENOXAPARIN SODIUM 40 MG/0.4ML ~~LOC~~ SOLN
40.0000 mg | SUBCUTANEOUS | Status: DC
  Administered 2011-12-23: 40 mg via SUBCUTANEOUS
  Filled 2011-12-23 (×3): qty 0.4

## 2011-12-23 MED ORDER — SODIUM CHLORIDE 0.9 % IJ SOLN: 3.0000 mL | INTRAMUSCULAR | Status: DC | PRN

## 2011-12-23 MED ORDER — CLOPIDOGREL BISULFATE 75 MG PO TABS
75.0000 mg | ORAL_TABLET | Freq: Once | ORAL | Status: AC
  Administered 2011-12-23: 75 mg via ORAL
  Filled 2011-12-23 (×2): qty 1

## 2011-12-23 MED ORDER — ONDANSETRON HCL 4 MG/2ML IJ SOLN
4.0000 mg | Freq: Once | INTRAMUSCULAR | Status: AC
  Administered 2011-12-23: 4 mg via INTRAVENOUS

## 2011-12-23 MED ORDER — ASPIRIN 81 MG PO CHEW
324.0000 mg | CHEWABLE_TABLET | Freq: Once | ORAL | Status: AC
  Administered 2011-12-23: 324 mg via ORAL
  Filled 2011-12-23: qty 4

## 2011-12-23 MED ORDER — ZOLPIDEM TARTRATE 5 MG PO TABS
5.0000 mg | ORAL_TABLET | Freq: Once | ORAL | Status: AC
  Administered 2011-12-23: 5 mg via ORAL
  Filled 2011-12-23: qty 1

## 2011-12-23 MED ORDER — SODIUM CHLORIDE 0.9 % IJ SOLN
3.0000 mL | INTRAMUSCULAR | Status: DC | PRN
  Administered 2011-12-23: 3 mL via INTRAVENOUS

## 2011-12-23 MED ORDER — NITROGLYCERIN 0.4 MG SL SUBL
0.4000 mg | SUBLINGUAL_TABLET | SUBLINGUAL | Status: AC | PRN
  Administered 2011-12-23 (×3): 0.4 mg via SUBLINGUAL

## 2011-12-23 MED ORDER — ONDANSETRON HCL 4 MG/2ML IJ SOLN
INTRAMUSCULAR | Status: AC
  Filled 2011-12-23: qty 2

## 2011-12-23 MED ORDER — DIAZEPAM 5 MG PO TABS
5.0000 mg | ORAL_TABLET | ORAL | Status: AC
  Administered 2011-12-24: 5 mg via ORAL
  Filled 2011-12-23: qty 1

## 2011-12-23 MED ORDER — NITROGLYCERIN 0.4 MG SL SUBL: 0.4000 mg | SUBLINGUAL_TABLET | SUBLINGUAL | Status: DC | PRN

## 2011-12-23 MED ORDER — ACETAMINOPHEN 325 MG PO TABS: 650.0000 mg | ORAL_TABLET | ORAL | Status: DC | PRN

## 2011-12-23 MED ORDER — CARVEDILOL 6.25 MG PO TABS
6.2500 mg | ORAL_TABLET | Freq: Two times a day (BID) | ORAL | Status: DC
  Administered 2011-12-23 – 2011-12-24 (×2): 6.25 mg via ORAL
  Filled 2011-12-23 (×5): qty 1

## 2011-12-23 MED ORDER — ASPIRIN 81 MG PO CHEW
324.0000 mg | CHEWABLE_TABLET | ORAL | Status: AC
  Administered 2011-12-24: 324 mg via ORAL
  Filled 2011-12-23: qty 4

## 2011-12-23 MED ORDER — SODIUM CHLORIDE 0.9 % IJ SOLN
3.0000 mL | Freq: Two times a day (BID) | INTRAMUSCULAR | Status: DC
  Administered 2011-12-24: 3 mL via INTRAVENOUS

## 2011-12-23 MED ORDER — SODIUM CHLORIDE 0.9 % IV SOLN: 250.0000 mL | INTRAVENOUS | Status: DC | PRN

## 2011-12-23 MED ORDER — ONDANSETRON HCL 4 MG/2ML IJ SOLN
4.0000 mg | Freq: Once | INTRAMUSCULAR | Status: AC
  Administered 2011-12-23: 4 mg via INTRAVENOUS
  Filled 2011-12-23: qty 2

## 2011-12-23 MED ORDER — ASPIRIN EC 81 MG PO TBEC
81.0000 mg | DELAYED_RELEASE_TABLET | Freq: Every day | ORAL | Status: DC
  Filled 2011-12-23: qty 1

## 2011-12-23 MED ORDER — SIMVASTATIN 20 MG PO TABS
20.0000 mg | ORAL_TABLET | Freq: Every day | ORAL | Status: DC
  Administered 2011-12-23: 20 mg via ORAL
  Filled 2011-12-23 (×3): qty 1

## 2011-12-23 MED ORDER — ONDANSETRON HCL 4 MG/2ML IJ SOLN
4.0000 mg | Freq: Four times a day (QID) | INTRAMUSCULAR | Status: DC | PRN
  Administered 2011-12-23 – 2011-12-24 (×2): 4 mg via INTRAVENOUS
  Filled 2011-12-23 (×2): qty 2

## 2011-12-23 MED ORDER — MORPHINE SULFATE 4 MG/ML IJ SOLN
4.0000 mg | Freq: Once | INTRAMUSCULAR | Status: AC
  Administered 2011-12-23: 4 mg via INTRAVENOUS
  Filled 2011-12-23: qty 1

## 2011-12-23 NOTE — H&P (Signed)
Physician History and Physical  Patient ID: Lindsey [name] MRN: [id number] DOB/AGE: 43/22/1972 43 y.o. Admit date: 12/23/2011  Primary Care Physician: Valera Castle, MD Primary Cardiologist:  Daleen Squibb  Active Problems:  * No active hospital problems. *    HPI:   41 yo with previous anterior MI and stent to LAD  Cath 06/17/2007 by Dr Lonzo Candy with BMS to LAD no disease in RCA or Circumflex.  ? History of AT3 deficiency but not on coumadin.   Had been doing well until a few days ago.  BP up Headache malaise  Today had squeezing SSCP and pressure in chest similar to previous MI.  Had pancreatitis earlier this year but that pain  Was more epigastric.  Pain relieved in ER with nitro.  POC negative and no acute ECG changes.  No associated dyspnea palpitations or syncope.  Compliant with meds.  Currently is pain free And comfortable  Review of systems complete and found to be negative unless listed above   Past Medical History  Diagnosis Date  . Stroke   . Myocardial infarct   . Blood transfusion   . Anxiety   . Coronary artery disease     Family History  Problem Relation Age of Onset  . Heart disease Brother     History   Social History  . Marital Status: Divorced    Spouse Name: N/A    Number of Children: N/A  . Years of Education: N/A   Occupational History  . Not on file.   Social History Main Topics  . Smoking status: Current Every Day Smoker -- 0.2 packs/day for 10 years    Types: Cigarettes  . Smokeless tobacco: Not on file  . Alcohol Use: No     occasion  . Drug Use: No  . Sexually Active:    Other Topics Concern  . Not on file   Social History Narrative  . No narrative on file    Past Surgical History  Procedure Date  . Tubal ligation   . Cardiac catheterization       (Not in a hospital admission)  Physical Exam:  BP 158/104  Pulse 103  Temp 98.7 F (37.1 C) (Oral)  Resp 18  SpO2 99%  LMP 12/18/2011    Affect appropriate Overweight black  female HEENT: normal Neck supple with no adenopathy JVP normal no bruits no thyromegaly Lungs clear with no wheezing and good diaphragmatic motion Heart:  S1/S2 no murmur, no rub, gallop or click PMI normal Abdomen: benighn, BS positve, no tenderness, no AAA no bruit.  No HSM or HJR Distal pulses intact with no bruits No edema Neuro non-focal Skin warm and dry No muscular weakness   Labs:   Lab Results  Component Value Date   WBC 6.2 12/23/2011   HGB 13.5 12/23/2011   HCT 39.8 12/23/2011   MCV 86.7 12/23/2011   PLT 244 12/23/2011    Lab 12/23/11 1627  NA 138  K 4.0  CL 102  CO2 22  BUN 6  CREATININE 0.80  CALCIUM 9.7  PROT --  BILITOT --  ALKPHOS --  ALT --  AST --  GLUCOSE 98   Lab Results  Component Value Date   CKTOTAL 67 06/29/2011   CKMB 1.5 06/29/2011   TROPONINI <0.30 06/29/2011    Lab Results  Component Value Date   CHOL 155 06/30/2011   CHOL 213* 06/05/2011   CHOL 173 12/27/2010   Lab Results  Component Value Date  HDL 48 06/30/2011   HDL 108 06/05/2011   HDL 84 12/27/2010   Lab Results  Component Value Date   LDLCALC 88 06/30/2011   LDLCALC 91 06/05/2011   LDLCALC 70 12/27/2010   Lab Results  Component Value Date   TRIG 93 06/30/2011   TRIG 71 06/05/2011   TRIG 96 12/27/2010   Lab Results  Component Value Date   CHOLHDL 3.2 06/30/2011   CHOLHDL 2.0 06/05/2011   CHOLHDL 2.1 12/27/2010   No results found for this basename: LDLDIRECT      Radiology: Dg Chest 2 View  12/23/2011  *RADIOLOGY REPORT*  Clinical Data: Chest pain  CHEST - 2 VIEW  Comparison: June 29, 2011  Findings:  The lungs are clear.  The heart size and pulmonary vascularity are normal.  No adenopathy.  No bone lesions.  IMPRESSION: Lungs are clear.   Original Report Authenticated By: Arvin Collard. WOODRUFF III, M.D.     EKG: NSR old anterior MI no acute changes  ASSESSMENT AND PLAN: Chest Pain : History of CAD and pain relieved with nitro.  Discussed with patient and favor  cath in am She is amenable.  Continue asa and plavix  Since ECG has no changes and enzymes negative would not start Heparin unless pain is recurrent HTN:  Continue home meds.  Consider adding ACE if stays up given history of anterior MI Chol:  Continue statin.  LDL 88 4/13  Signed: Peter Nishan10/10/2011, 6:26 PM

## 2011-12-23 NOTE — ED Provider Notes (Signed)
History     CSN: [id number]  Arrival date & time 12/23/11  1619   First MD Initiated Contact with Patient 12/23/11 1656      Chief Complaint  Patient presents with  . Chest Pain    (Consider location/radiation/quality/duration/timing/severity/associated sxs/prior treatment) HPI Pt with history of CAD, s/p stent about 4 years ago reports she has been feeling generally ill for the last two days. Mild SOB yesterday today began having intermittent moderate L chest squeezing that has now become constant, 5/10. Non radiating, no nausea or vomiting. Did not take NTG at home. Also has moderate to severe diffuse aching headache.   Past Medical History  Diagnosis Date  . Stroke   . Myocardial infarct   . Blood transfusion   . Anxiety   . Coronary artery disease     Past Surgical History  Procedure Date  . Tubal ligation   . Cardiac catheterization     Family History  Problem Relation Age of Onset  . Heart disease Brother     History  Substance Use Topics  . Smoking status: Current Every Day Smoker -- 0.2 packs/day for 10 years    Types: Cigarettes  . Smokeless tobacco: Not on file  . Alcohol Use: No     occasion    OB History    Grav Para Term Preterm Abortions TAB SAB Ect Mult Living                  Review of Systems All other systems reviewed and are negative except as noted in HPI.   Allergies  Review of patient's allergies indicates no known allergies.  Home Medications   Current Outpatient Rx  Name Route Sig Dispense Refill  . ASPIRIN 81 MG PO TABS Oral Take 1 tablet (81 mg total) by mouth daily. 30 tablet 0  . CARVEDILOL 6.25 MG PO TABS Oral Take 1 tablet (6.25 mg total) by mouth 2 (two) times daily with a meal. 60 tablet 0  . CLOPIDOGREL BISULFATE 75 MG PO TABS Oral Take 1 tablet (75 mg total) by mouth daily. 30 tablet 0  . SIMVASTATIN 20 MG PO TABS Oral Take 1 tablet (20 mg total) by mouth every evening. 30 tablet 0    BP 158/104  Pulse 103  Temp  98.7 F (37.1 C) (Oral)  Resp 18  SpO2 99%  LMP 12/18/2011  Physical Exam  Nursing note and vitals reviewed. Constitutional: She is oriented to person, place, and time. She appears well-developed and well-nourished.  HENT:  Head: Normocephalic and atraumatic.  Eyes: EOM are normal. Pupils are equal, round, and reactive to light.  Neck: Normal range of motion. Neck supple.  Cardiovascular: Normal rate, normal heart sounds and intact distal pulses.   Pulmonary/Chest: Effort normal and breath sounds normal.  Abdominal: Bowel sounds are normal. She exhibits no distension. There is no tenderness.  Musculoskeletal: Normal range of motion. She exhibits no edema and no tenderness.  Neurological: She is alert and oriented to person, place, and time. She has normal strength. No cranial nerve deficit or sensory deficit.  Skin: Skin is warm and dry. No rash noted.  Psychiatric: She has a normal mood and affect.    ED Course  Procedures (including critical care time)  Labs Reviewed  BASIC METABOLIC PANEL - Abnormal; Notable for the following:    GFR calc non Af Amer 89 (*)     All other components within normal limits  CBC  PRO B NATRIURETIC PEPTIDE  POCT I-STAT TROPONIN I  AMYLASE  LIPASE, BLOOD   Dg Chest 2 View  12/23/2011  *RADIOLOGY REPORT*  Clinical Data: Chest pain  CHEST - 2 VIEW  Comparison: June 29, 2011  Findings:  The lungs are clear.  The heart size and pulmonary vascularity are normal.  No adenopathy.  No bone lesions.  IMPRESSION: Lungs are clear.   Original Report Authenticated By: Arvin Collard. WOODRUFF III, M.D.      No diagnosis found.    MDM   Date: 12/23/2011  Rate: 91  Rhythm: normal sinus rhythm  QRS Axis: right  Intervals: normal  ST/T Wave abnormalities: nonspecific T wave changes  Conduction Disutrbances:none  Narrative Interpretation:   Old EKG Reviewed: unchanged  6:33 PM Pain improved with NTG. Discussed with Dr. Eden Emms who will admit for further  eval.         Leonette Most B. Bernette Mayers, MD 12/23/11 [phone]

## 2011-12-23 NOTE — ED Notes (Signed)
EKG completed and given to Dr. Estell Harpin along with OLD ekg.

## 2011-12-23 NOTE — ED Notes (Signed)
Pt c/o of chest pain again.

## 2011-12-23 NOTE — ED Notes (Signed)
Pt denies CP

## 2011-12-23 NOTE — ED Notes (Signed)
Pt to XR

## 2011-12-23 NOTE — ED Notes (Addendum)
Pt reports having cold/flu like s/s last week and continues with a cough at this time. Reports that she felt like she "was confused" earlier today and had nausea.

## 2011-12-23 NOTE — ED Notes (Signed)
Pt back in room from XR 

## 2011-12-23 NOTE — ED Notes (Signed)
Pt reports did not feel good yesterday, and started to have SOB yesterday, reports that today, SOB got worse and started to have CP, described as squeezing; having n/v/chills/sob; also feels like at times her heart is fluttering; pt sees Dr Daleen Squibb for cards; has had 2 MIs and 2 CVA-has been 4 years since last one

## 2011-12-24 ENCOUNTER — Encounter (HOSPITAL_COMMUNITY)
Admission: EM | Disposition: A | Discharge status: Home / Self Care | Payer: Self-pay | Source: Home / Self Care | Attending: Emergency Medicine

## 2011-12-24 ENCOUNTER — Encounter (HOSPITAL_COMMUNITY): Payer: Self-pay | Admitting: Cardiology

## 2011-12-24 DIAGNOSIS — I2510 Atherosclerotic heart disease of native coronary artery without angina pectoris: Secondary | ICD-10-CM

## 2011-12-24 HISTORY — PX: LEFT HEART CATHETERIZATION WITH CORONARY ANGIOGRAM: SHX5451

## 2011-12-24 LAB — TROPONIN I: Troponin I: <0.3 ng/mL (ref <0.30)

## 2011-12-24 SURGERY — LEFT HEART CATHETERIZATION WITH CORONARY ANGIOGRAM
Anesthesia: LOCAL

## 2011-12-24 MED ORDER — NITROGLYCERIN 0.4 MG SL SUBL: 0.4000 mg | SUBLINGUAL_TABLET | SUBLINGUAL | Status: DC | PRN

## 2011-12-24 MED ORDER — SIMVASTATIN 20 MG PO TABS: 20.0000 mg | ORAL_TABLET | Freq: Every evening | ORAL | Status: DC

## 2011-12-24 MED ORDER — HEPARIN (PORCINE) IN NACL 2-0.9 UNIT/ML-% IJ SOLN
INTRAMUSCULAR | Status: AC
  Filled 2011-12-24: qty 500

## 2011-12-24 MED ORDER — CLOPIDOGREL BISULFATE 75 MG PO TABS: 75.0000 mg | ORAL_TABLET | Freq: Every day | ORAL | Status: DC

## 2011-12-24 MED ORDER — CARVEDILOL 6.25 MG PO TABS: 6.2500 mg | ORAL_TABLET | Freq: Two times a day (BID) | ORAL | Status: DC

## 2011-12-24 MED ORDER — FENTANYL CITRATE 0.05 MG/ML IJ SOLN
INTRAMUSCULAR | Status: AC
  Filled 2011-12-24: qty 2

## 2011-12-24 MED ORDER — NITROGLYCERIN 0.2 MG/ML ON CALL CATH LAB
INTRAVENOUS | Status: AC
  Filled 2011-12-24: qty 1

## 2011-12-24 MED ORDER — MIDAZOLAM HCL 2 MG/2ML IJ SOLN
INTRAMUSCULAR | Status: AC
  Filled 2011-12-24: qty 2

## 2011-12-24 MED ORDER — HEPARIN SODIUM (PORCINE) 1000 UNIT/ML IJ SOLN
INTRAMUSCULAR | Status: AC
  Filled 2011-12-24: qty 1

## 2011-12-24 MED ORDER — VERAPAMIL HCL 2.5 MG/ML IV SOLN
INTRAVENOUS | Status: AC
  Filled 2011-12-24: qty 2

## 2011-12-24 MED ORDER — SODIUM CHLORIDE 0.9 % IV SOLN: INTRAVENOUS | Status: AC

## 2011-12-24 MED ORDER — LIDOCAINE HCL (PF) 1 % IJ SOLN
INTRAMUSCULAR | Status: AC
  Filled 2011-12-24: qty 30

## 2011-12-24 NOTE — Interval H&P Note (Signed)
History and Physical Interval Note:  12/24/2011 2:13 PM  [name]  has presented today for cardiac cath  with the diagnosis of cp  The various methods of treatment have been discussed with the patient and family. After consideration of risks, benefits and other options for treatment, the patient has consented to  Procedure(s) (LRB) with comments: LEFT HEART CATHETERIZATION WITH CORONARY ANGIOGRAM (N/A) as a surgical intervention .  The patient's history has been reviewed, patient examined, no change in status, stable for surgery.  I have reviewed the patient's chart and labs.  Questions were answered to the patient's satisfaction.     MCALHANY,CHRISTOPHER

## 2011-12-24 NOTE — Progress Notes (Signed)
Pt. Says the nitro drip is relieving her chest pain. Sinthia Karabin, Melida Quitter

## 2011-12-24 NOTE — Progress Notes (Signed)
Utilization Review Completed.  

## 2011-12-24 NOTE — CV Procedure (Signed)
    Cardiac Catheterization Operative Report  Lindsey [name] [id number] 10/9/20132:48 PM Valera Castle, MD  Procedure Performed:  1. Left Heart Catheterization 2. Selective Coronary Angiography 3. Left ventricular angiogram  Operator: Verne Carrow, MD  Arterial access site:  Right radial artery.   Indication:  History of CAD, admitted with chest pain concerning for unstable angina, cardiac enzymes negative.                                      Procedure Details: The risks, benefits, complications, treatment options, and expected outcomes were discussed with the patient. The patient and/or family concurred with the proposed plan, giving informed consent. The patient was brought to the cath lab after IV hydration was begun and oral premedication was given. The patient was further sedated with Versed and Fentanyl. The right wrist was assessed with an Allens test which was positive. The right wrist was prepped and draped in a sterile fashion. 1% lidocaine was used for local anesthesia. Using the modified Seldinger access technique, a 5 French sheath was placed in the right radial artery. 3 mg Verapamil was given through the sheath. 4000 units IV heparin was given. Standard diagnostic catheters were used to perform selective coronary angiography. A pigtail catheter was used to perform a left ventricular angiogram. The sheath was removed from the right radial artery and a Terumo hemostasis band was applied at the arteriotomy site on the right wrist.    There were no immediate complications. The patient was taken to the recovery area in stable condition.   Hemodynamic Findings: Central aortic pressure: 149/90 Left ventricular pressure: 142/8/12  Angiographic Findings:  Left main: No obstructive disease.   Left Anterior Descending Artery: Large caliber vessel that courses to the apex. There is a patent stent in the proximal LAD with 30-40% in stent restenosis which is unchanged from  her last cath. The mid vessel has mild plaque and a long segment of myocardial bridging. The diagonal is small to moderate sized and free of any disease.   Circumflex Artery: Large caliber vessel with large bifurcating first OM branch and a moderate sized second obtuse marginal branch. No disease in the Circumflex system.   Right Coronary Artery: Moderate sized vessel with no disease noted.   Left Ventricular Angiogram: LVEF=55%. Apical akinesis with apical aneurysm. This appears unchanged from cath in 2011.   Impression: 1. Single vessel CAD with patent stent in the proximal LAD with stable mild in-stent restenosis.  2. Segmental wall motion abnormality with preserved LV systolic function that is stable over the last 3 years.  3. Non-cardiac chest pain  Recommendations: Continue medical management.        Complications:  None. The patient tolerated the procedure well.

## 2011-12-24 NOTE — Discharge Summary (Signed)
Discharge Summary   Patient ID: Lindsey [name] MRN: [id number], DOB/AGE: 07-21-1970 41 y.o.  Primary Cardiologist: Dr. Daleen Squibb Admit date: 12/23/2011 D/C date:     12/24/2011     Primary Discharge Diagnoses:  1. Midsternal Chest Pain, Noncardiac   - Cath showed single vessel CAD w/ patent stent in prox LAD w/ stable mild ISR, EF=55%  2. Tobacco Abuse  3. High Blood Pressure  - Patient denies prior h/o HTN. Elevated BP this admission  - Cont BB, F/u w/ PCP  Secondary Discharge Diagnoses:  . Stroke   . Blood transfusion   . Anxiety   . Coronary artery disease     Apical LAD infarction '00; NSTEMI s/p BMS to prox LAD '09; Cath 12/2011 single vessel CAD w/ patent LAD stent w/ stable mild ISR, nl LV systolic fxn  . Anemia   . Antithrombin III deficiency     ?pt not sure if true diagnosis    Allergies No Known Allergies  Diagnostic Studies/Procedures:   12/24/11 - Cardiac Cath Hemodynamic Findings:  Central aortic pressure: 149/90  Left ventricular pressure: 142/8/12  Angiographic Findings:  Left main: No obstructive disease.  Left Anterior Descending Artery: Large caliber vessel that courses to the apex. There is a patent stent in the proximal LAD with 30-40% in stent restenosis which is unchanged from her last cath. The mid vessel has mild plaque and a long segment of myocardial bridging. The diagonal is small to moderate sized and free of any disease.  Circumflex Artery: Large caliber vessel with large bifurcating first OM branch and a moderate sized second obtuse marginal branch. No disease in the Circumflex system.  Right Coronary Artery: Moderate sized vessel with no disease noted.  Left Ventricular Angiogram: LVEF=55%. Apical akinesis with apical aneurysm. This appears unchanged from cath in 2011.  Impression:  1. Single vessel CAD with patent stent in the proximal LAD with stable mild in-stent restenosis.  2. Segmental wall motion abnormality with preserved LV systolic  function that is stable over the last 3 years.  3. Non-cardiac chest pain  Recommendations: Continue medical management.    History of Present Illness: 41 y.o. female w/ the above medical problems who presented to [facility] on 12/23/11 with complaints of chest pain.  Hospital Course: In the ED, EKG revealed NSR old anterior MI no acute changes. CXR was without acute cardiopulmonary abnormalities. Labs were significant for normal cardiac enzymes, unremarkable CBC/BMET. Her symptoms were concerning for unstable angina so she was placed in observation for further evaluation and treatment. Cardiac enzymes were cycled and remained negative. She had recurrent chest pain overnight with resolution on IV NTG. Cath revealed single vessel CAD w/ patent prox LAD stent w/ stable mild ISR. She tolerated the procedure well without complications. Recommendations were made for continued medical management. She was able to ambulate without chest pain. Cath site remained stable. She was seen and evaluated by Dr. Clifton James who felt she was stable for discharge home with plans for follow up as scheduled below.  Discharge Vitals: Blood pressure 142/96, pulse 65, temperature 98.2 F (36.8 C), temperature source Oral, resp. rate 20, height 5\' 6"  (1.676 m), weight 179 lb 4.8 oz (81.33 kg), last menstrual period 12/18/2011, SpO2 100.00%.  Labs: Component Value Date   WBC 6.2 12/23/2011   HGB 13.5 12/23/2011   HCT 39.8 12/23/2011   MCV 86.7 12/23/2011   PLT 244 12/23/2011    Lab 12/23/11 1627  NA 138  K 4.0  CL 102  CO2 22  BUN 6  CREATININE 0.80  CALCIUM 9.7  GLUCOSE 98   Basename 12/24/11 0855 12/24/11 0237 12/23/11 2102  TROPONINI <0.30 <0.30 <0.30   Discharge Medications     Medication List     As of 12/24/2011  3:43 PM    TAKE these medications         aspirin 81 MG tablet   Take 1 tablet (81 mg total) by mouth daily.      carvedilol 6.25 MG tablet   Commonly known as: COREG   Take 1  tablet (6.25 mg total) by mouth 2 (two) times daily with a meal.      clopidogrel 75 MG tablet   Commonly known as: PLAVIX   Take 1 tablet (75 mg total) by mouth daily.      nitroGLYCERIN 0.4 MG SL tablet   Commonly known as: NITROSTAT   Place 1 tablet (0.4 mg total) under the tongue every 5 (five) minutes as needed for chest pain (up to 3 doses).      simvastatin 20 MG tablet   Commonly known as: ZOCOR   Take 1 tablet (20 mg total) by mouth every evening.         Disposition   Discharge Orders    Future Appointments: Provider: Department: Dept Phone: Center:   01/14/2012 9:00 AM Dyann Kief, PA [facility] [phone] [id number]     Future Orders Please Complete By Expires   Diet - low sodium heart healthy      Increase activity slowly      Discharge instructions      Comments:   * KEEP WRIST CATHETERIZATION SITE CLEAN AND DRY. Call the office for any signs of bleeding, pus, swelling, increased pain, or any other concerns. * NO HEAVY LIFTING (>10lbs) OR SEXUAL ACTIVITY X 7 DAYS. * NO DRIVING X 2-3 DAYS. * NO SOAKING BATHS, HOT TUBS, POOLS, ETC., X 7 DAYS.  * Your heart catheterization did not show evidence that your chest pain is coming from your heart.  * Please keep a log of your BP and bring it to your office follow up with your primary care provider.  * Please stop smoking!     Follow-up Information    Follow up with Jacolyn Reedy, PA. On 01/14/2012. (9:00)    Contact information:   [facility] [address] [place], [address] [place] [address] [phone]       Schedule an appointment as soon as possible for a visit with Your Primary Care Provider.          Outstanding Labs/Studies:  None  Duration of Discharge Encounter: Greater than 30 minutes including physician and PA time.  Signed, Feleshia Zundel PA-C 12/24/2011, 3:43 PM

## 2011-12-24 NOTE — Progress Notes (Signed)
Received update on patient that she will not be returning to the unit. Will be transferred to short stay and discharged from there.

## 2011-12-24 NOTE — Progress Notes (Signed)
Cardiology Progress Note Patient Name: Lindsey [name] Date of Encounter: 12/24/2011, 8:55 AM     Subjective  Patient had chest pain last night relieved with NTG drip. Pain free this morning on . No sob.   Objective   Telemetry: Sinus rhythm 60-80s  Medications: . aspirin  324 mg Oral Once  . aspirin  324 mg Oral Pre-Cath  . aspirin EC  81 mg Oral Daily  . carvedilol  6.25 mg Oral BID WC  . clopidogrel  75 mg Oral Q breakfast  . clopidogrel  75 mg Oral Once  . diazepam  5 mg Oral On Call  . enoxaparin (LOVENOX) injection  40 mg Subcutaneous Q24H  .  morphine injection  4 mg Intravenous Once  . ondansetron (ZOFRAN) IV  4 mg Intravenous Once  . ondansetron  4 mg Intravenous Once  . simvastatin  20 mg Oral q1800  . sodium chloride  3 mL Intravenous Q12H  . sodium chloride  3 mL Intravenous Q12H  . zolpidem  5 mg Oral Once   . nitroGLYCERIN 5 mcg/min (12/23/11 2337)    Physical Exam: Temp:  [98.6 F (37 C)-98.9 F (37.2 C)] 98.6 F (37 C) (10/09 0616) Pulse Rate:  [70-103] 70  (10/09 0616) Resp:  [13-18] 18  (10/09 0202) BP: (118-158)/(81-104) 118/81 mmHg (10/09 0616) SpO2:  [97 %-100 %] 100 % (10/09 0616) Weight:  [179 lb 4.8 oz (81.33 kg)-179 lb 12.8 oz (81.557 kg)] 179 lb 4.8 oz (81.33 kg) (10/09 1610)  General: Overweight black female, in no acute distress. Head: Normocephalic, atraumatic, sclera non-icteric, nares are without discharge.  Neck: Supple. Negative for carotid bruits or JVD Lungs: Clear bilaterally to auscultation without wheezes, rales, or rhonchi. Breathing is unlabored. Heart: RRR S1 S2 without murmurs, rubs, or gallops.  Abdomen: Soft, non-tender, non-distended with normoactive bowel sounds. No rebound/guarding. No obvious abdominal masses. Msk:  Strength and tone appear normal for age. Extremities: No edema. No clubbing or cyanosis. Distal pedal pulses are intact and equal bilaterally. Neuro: Alert and oriented X 3. Moves all extremities  spontaneously. Psych:  Responds to questions appropriately with a normal affect.   Labs:  [facility] 12/23/11 1627  NA 138  K 4.0  CL 102  CO2 22  GLUCOSE 98  BUN 6  CREATININE 0.80  CALCIUM 9.7   Basename 12/23/11 1759  LIPASE 25  AMYLASE 88   Basename 12/23/11 1627  WBC 6.2  HGB 13.5  HCT 39.8  MCV 86.7  PLT 244   Basename 12/24/11 0237 12/23/11 2102  TROPONINI <0.30 <0.30   Basename 12/23/11 2102  TSH 1.251     12/23/2011 21:02  Prothrombin Time 13.5  INR 1.04     12/23/2011 16:27  Pro B Natriuretic peptide (BNP) 38.6    Radiology/Studies:   12/23/2011 - CHEST - 2 VIEW  Findings:  The lungs are clear.  The heart size and pulmonary vascularity are normal.  No adenopathy.  No bone lesions.  IMPRESSION: Lungs are clear.      Assessment and Plan   1. Chest pain concerning for Unstable Angina: Recurrent chest pain last night relieved with IV NTG. Cardiac enzymes normal. EKG nonischemic. Cath today with further plans pending results.  2. Coronary Artery Disease: NSTEMI s/p BMS to LAD '09. Plans as above.  Cont ASA, Plavix, BB, and statin  3. High blood pressure: Patient denies h/o HTN. BP improved this morning  4. Tobacco Abuse: Down to 3 cigarettes  a day. Feels ready to quite completely.   5. ? Antithrombin III deficiency: States she was told by hematology that she does not have AT3 def, but has been told by other doctors that she does. Patient states she used to be on Lovenox & coumadin, but this was stopped by Dr. Daleen Squibb 1-38yrs ago and was placed on ASA/Plavix.    Signed, HOPE, JESSICA PA-C  I have personally seen and examined this patient with [facility], PA-C. I agree with the assessment and plan as outlined above. Plans for cardiac cath today.   MCALHANY,CHRISTOPHER 2:14 PM 12/24/2011

## 2011-12-24 NOTE — Progress Notes (Signed)
Pt started complaining of continuing nausea and vomiting, and recurring chest pain some time after she arrived to 4700. Dr. Mayford Knife was notified. Orders for an extra one time dose of zofran which was given along with one time dose of ambien ordered per patient request. Also ordered to start nitro drip to control chest pain. This was given as well. Amal Saiki, Melida Quitter

## 2011-12-24 NOTE — Discharge Summary (Signed)
See full note and cath note today. cdm

## 2012-01-14 ENCOUNTER — Encounter: Payer: Self-pay | Admitting: Physician Assistant

## 2012-06-04 ENCOUNTER — Inpatient Hospital Stay (HOSPITAL_COMMUNITY)
Admission: EM | Admit: 2012-06-04 | Discharge: 2012-06-09 | DRG: 378 | Disposition: A | Discharge status: Home / Self Care | Payer: MEDICAID | Attending: Internal Medicine | Admitting: Internal Medicine

## 2012-06-04 ENCOUNTER — Encounter (HOSPITAL_COMMUNITY): Payer: Self-pay | Admitting: Emergency Medicine

## 2012-06-04 DIAGNOSIS — I2109 ST elevation (STEMI) myocardial infarction involving other coronary artery of anterior wall: Secondary | ICD-10-CM

## 2012-06-04 DIAGNOSIS — I10 Essential (primary) hypertension: Secondary | ICD-10-CM | POA: Diagnosis present

## 2012-06-04 DIAGNOSIS — E876 Hypokalemia: Secondary | ICD-10-CM | POA: Diagnosis present

## 2012-06-04 DIAGNOSIS — E669 Obesity, unspecified: Secondary | ICD-10-CM | POA: Diagnosis present

## 2012-06-04 DIAGNOSIS — I2510 Atherosclerotic heart disease of native coronary artery without angina pectoris: Secondary | ICD-10-CM | POA: Diagnosis present

## 2012-06-04 DIAGNOSIS — K449 Diaphragmatic hernia without obstruction or gangrene: Secondary | ICD-10-CM | POA: Diagnosis present

## 2012-06-04 DIAGNOSIS — D6859 Other primary thrombophilia: Secondary | ICD-10-CM

## 2012-06-04 DIAGNOSIS — K922 Gastrointestinal hemorrhage, unspecified: Secondary | ICD-10-CM | POA: Diagnosis present

## 2012-06-04 DIAGNOSIS — Z9861 Coronary angioplasty status: Secondary | ICD-10-CM

## 2012-06-04 DIAGNOSIS — Z8673 Personal history of transient ischemic attack (TIA), and cerebral infarction without residual deficits: Secondary | ICD-10-CM

## 2012-06-04 DIAGNOSIS — I252 Old myocardial infarction: Secondary | ICD-10-CM

## 2012-06-04 DIAGNOSIS — K5731 Diverticulosis of large intestine without perforation or abscess with bleeding: Principal | ICD-10-CM | POA: Diagnosis present

## 2012-06-04 LAB — CBC WITH DIFFERENTIAL/PLATELET
Basophils Absolute: 0.1 10*3/uL (ref 0.0–0.1)
Basophils Relative: 1 % (ref 0–1)
Eosinophils Relative: 2 % (ref 0–5)
HCT: 38.5 % (ref 36.0–46.0)
MCHC: 32.5 g/dL (ref 30.0–36.0)
MCV: 92.5 fL (ref 78.0–100.0)
Monocytes Absolute: 0.9 10*3/uL (ref 0.1–1.0)
Neutro Abs: 5.5 10*3/uL (ref 1.7–7.7)
Platelets: 182 10*3/uL (ref 150–400)
RDW: 15.4 % (ref 11.5–15.5)

## 2012-06-04 LAB — POCT I-STAT, CHEM 8
BUN: 6 mg/dL (ref 6–23)
Calcium, Ion: 1.21 mmol/L (ref 1.12–1.23)
Hemoglobin: 14.3 g/dL (ref 12.0–15.0)
Sodium: 142 mEq/L (ref 135–145)
TCO2: 28 mmol/L (ref 0–100)

## 2012-06-04 LAB — OCCULT BLOOD, POC DEVICE: Fecal Occult Bld: POSITIVE — AB

## 2012-06-04 LAB — HEMOGLOBIN AND HEMATOCRIT, BLOOD: Hemoglobin: 12.4 g/dL (ref 12.0–15.0)

## 2012-06-04 MED ORDER — SODIUM CHLORIDE 0.9 % IV BOLUS (SEPSIS)
1000.0000 mL | Freq: Once | INTRAVENOUS | Status: AC
  Administered 2012-06-04: 1000 mL via INTRAVENOUS

## 2012-06-04 MED ORDER — ALUM & MAG HYDROXIDE-SIMETH 200-200-20 MG/5ML PO SUSP: 30.0000 mL | Freq: Four times a day (QID) | ORAL | Status: DC | PRN

## 2012-06-04 MED ORDER — SODIUM CHLORIDE 0.9 % IV SOLN
INTRAVENOUS | Status: DC
  Administered 2012-06-05 – 2012-06-09 (×6): via INTRAVENOUS

## 2012-06-04 MED ORDER — OXYCODONE HCL 5 MG PO TABS
5.0000 mg | ORAL_TABLET | ORAL | Status: DC | PRN
  Administered 2012-06-07 – 2012-06-09 (×10): 5 mg via ORAL
  Filled 2012-06-04 (×10): qty 1

## 2012-06-04 MED ORDER — HYDROMORPHONE HCL PF 1 MG/ML IJ SOLN
0.5000 mg | INTRAMUSCULAR | Status: DC | PRN
  Administered 2012-06-05 – 2012-06-09 (×23): 1 mg via INTRAVENOUS
  Filled 2012-06-04 (×24): qty 1

## 2012-06-04 MED ORDER — POTASSIUM CHLORIDE CRYS ER 20 MEQ PO TBCR
40.0000 meq | EXTENDED_RELEASE_TABLET | Freq: Once | ORAL | Status: AC
  Administered 2012-06-05: 40 meq via ORAL
  Filled 2012-06-04: qty 2

## 2012-06-04 MED ORDER — SODIUM CHLORIDE 0.9 % IJ SOLN
3.0000 mL | Freq: Two times a day (BID) | INTRAMUSCULAR | Status: DC
  Administered 2012-06-05 – 2012-06-06 (×3): 3 mL via INTRAVENOUS

## 2012-06-04 MED ORDER — ACETAMINOPHEN 325 MG PO TABS: 650.0000 mg | ORAL_TABLET | Freq: Four times a day (QID) | ORAL | Status: DC | PRN

## 2012-06-04 MED ORDER — ZOLPIDEM TARTRATE 5 MG PO TABS
5.0000 mg | ORAL_TABLET | Freq: Every evening | ORAL | Status: DC | PRN
  Administered 2012-06-05 – 2012-06-07 (×4): 5 mg via ORAL
  Filled 2012-06-04 (×4): qty 1

## 2012-06-04 MED ORDER — SODIUM CHLORIDE 0.9 % IV SOLN
INTRAVENOUS | Status: DC
  Administered 2012-06-05: 01:00:00 via INTRAVENOUS

## 2012-06-04 MED ORDER — PANTOPRAZOLE SODIUM 40 MG IV SOLR
40.0000 mg | Freq: Two times a day (BID) | INTRAVENOUS | Status: DC
  Administered 2012-06-05 – 2012-06-09 (×10): 40 mg via INTRAVENOUS
  Filled 2012-06-04 (×11): qty 40

## 2012-06-04 MED ORDER — ACETAMINOPHEN 650 MG RE SUPP: 650.0000 mg | Freq: Four times a day (QID) | RECTAL | Status: DC | PRN

## 2012-06-04 MED ORDER — ONDANSETRON HCL 4 MG PO TABS: 4.0000 mg | ORAL_TABLET | Freq: Four times a day (QID) | ORAL | Status: DC | PRN

## 2012-06-04 MED ORDER — ONDANSETRON HCL 4 MG/2ML IJ SOLN: 4.0000 mg | Freq: Four times a day (QID) | INTRAMUSCULAR | Status: DC | PRN

## 2012-06-04 NOTE — ED Provider Notes (Signed)
Medical screening examination/treatment/procedure(s) were conducted as a shared visit with non-physician practitioner(s) and myself.  I personally evaluated the patient during the encounter.   Maroon stool.  Heme pos.  Hemodynamically stable.  admit  Donnetta Hutching, MD 06/04/12 2317

## 2012-06-04 NOTE — ED Provider Notes (Signed)
History     CSN: [id number]  Arrival date & time 06/04/12  1708   First MD Initiated Contact with Patient 06/04/12 2002      Chief Complaint  Patient presents with  . Rectal Bleeding    (Consider location/radiation/quality/duration/timing/severity/associated sxs/prior treatment) HPI  42 year old female with history of stroke, CAD, coagulopathy (antithrombin III deficiency) on Plavix presents for evaluation of rectal bleeding.  Patient reports for the past week each time that she had a bowel movement she noticed bright red blood in the toilet bowl. She is unable to quantify the amount but states that it filled up the bowl red.  Bleeding happen both with loose stools, hard stools, and sometime with flatus.  She denies any rectal pain but for the past 2 days she has had mild lower abdominal cramping which is intermittent.  She has had similar bout this this in the past which lasted only a few days.  She also has prior hx of anal skin tag and hemorrhoid.  She denies having fever, chills, cp, sob, dizziness, lightheadedness, dysuria or rash.  No recent trauma.  Pt has been off her Plavix and ASA for 2 weeks to prepare for upcoming dental procedure.  She does not have a GI doctor and no prior colonoscopy.  She feels more fatigue than usual.  No other NSAIDs use.  Has prior hx of CVA and MI  Past Medical History  Diagnosis Date  . Stroke   . Blood transfusion   . Anxiety   . Coronary artery disease     Apical LAD infarction '00; NSTEMI s/p BMS to prox LAD '09; Cath 12/2011 single vessel CAD w/ patent LAD stent w/ stable mild ISR, nl LV systolic fxn  . Anemia   . Antithrombin III deficiency     ?pt not sure if true diagnosis  . Tobacco abuse   . High blood pressure     Past Surgical History  Procedure Laterality Date  . Tubal ligation    . Cardiac catheterization      Family History  Problem Relation Age of Onset  . Heart disease Brother     History  Substance Use Topics  .  Smoking status: Current Some Day Smoker -- 0.20 packs/day for 1 years    Types: Cigarettes  . Smokeless tobacco: Not on file  . Alcohol Use: 0.0 oz/week    0 Cans of beer per week     Comment: occasion    OB History   Grav Para Term Preterm Abortions TAB SAB Ect Mult Living                  Review of Systems  Constitutional:       10 Systems reviewed and all are negative for acute change except as noted in the HPI.     Allergies  Review of patient's allergies indicates no known allergies.  Home Medications   Current Outpatient Rx  Name  Route  Sig  Dispense  Refill  . carvedilol (COREG) 6.25 MG tablet   Oral   Take 1 tablet (6.25 mg total) by mouth 2 (two) times daily with a meal.   60 tablet   6   . simvastatin (ZOCOR) 20 MG tablet   Oral   Take 1 tablet (20 mg total) by mouth every evening.   30 tablet   6   . aspirin 81 MG tablet   Oral   Take 1 tablet (81 mg total) by mouth daily.  30 tablet   0   . clopidogrel (PLAVIX) 75 MG tablet   Oral   Take 1 tablet (75 mg total) by mouth daily.   30 tablet   6   . nitroGLYCERIN (NITROSTAT) 0.4 MG SL tablet   Sublingual   Place 1 tablet (0.4 mg total) under the tongue every 5 (five) minutes as needed for chest pain (up to 3 doses).   25 tablet   3     BP 125/83  Pulse 98  Temp(Src) 98 F (36.7 C) (Oral)  SpO2 100%  Physical Exam  Nursing note and vitals reviewed. Constitutional: She is oriented to person, place, and time. She appears well-developed and well-nourished. No distress.  Awake, alert, nontoxic appearance  HENT:  Head: Atraumatic.  Eyes: Conjunctivae are normal. Right eye exhibits no discharge. Left eye exhibits no discharge.  Neck: Neck supple.  Cardiovascular: Normal rate and regular rhythm.   Pulmonary/Chest: Effort normal. No respiratory distress. She exhibits no tenderness.  Abdominal: Soft. There is no tenderness. There is no rebound.  Genitourinary:  Chaperone present:  Skin  tag and external hemorrhoid noted on visual exam. Rectal tenderness on digital rectal exam without mass or thrombosed hemorrhoid.  Maroon color blood noted, hemoccult positive.    Musculoskeletal: She exhibits no tenderness.  ROM appears intact, no obvious focal weakness  Neurological: She is alert and oriented to person, place, and time.  Mental status and motor strength appears intact  Skin: No rash noted.  Psychiatric: She has a normal mood and affect.    ED Course  Procedures (including critical care time)  8:18 PM Pt with rectal bleeding.  Has skin tag and hemorrhoid on exam. Hemoccult positive. Pt otherwise in NAD.  Abdomen nonsurgical.  Her Hgb is 14.3 today.  She has stable vital sign.    9:06 PM My attending has evaluate pt and felt she may benefit from admission for serial crits and colonoscopy.  Will consult with [facility] for admission.   10:40 PM I have consulted with [facility], Dr. Lovell Sheehan who agrees to admit to tele, team 8, obs, under her care.  Pt is hemodynamically stable.    Labs Reviewed  POCT I-STAT, CHEM 8 - Abnormal; Notable for the following:    Potassium 3.3 (*)    Glucose, Bld 111 (*)    All other components within normal limits  OCCULT BLOOD, POC DEVICE - Abnormal; Notable for the following:    Fecal Occult Bld POSITIVE (*)    All other components within normal limits  CBC WITH DIFFERENTIAL  OCCULT BLOOD X 1 CARD TO LAB, STOOL   No results found.   1. GI bleed       MDM  BP 125/83  Pulse 98  Temp(Src) 98 F (36.7 C) (Oral)  SpO2 100%  I have reviewed nursing notes and vital signs.  I reviewed available ER/hospitalization records thought the EMR  Medications  sodium chloride 0.9 % bolus 1,000 mL (not administered)  0.9 %  sodium chloride infusion (not administered)           Fayrene Helper, PA-C 06/04/12 2241

## 2012-06-04 NOTE — ED Notes (Signed)
Pt has had bright red rectal bleeding x1 week and states that it is becoming more frequent and that it is causing abd cramps for her.She had a hx of hemrrroids in the past but this is not the same.

## 2012-06-04 NOTE — H&P (Addendum)
[facility] History and Physical  Lindsey [name] WUJ:[id number] DOB: 09/18/1970 DOA: 06/04/2012  Referring physician: EDP PCP: Valera Castle, MD  Specialists:   Chief Complaint:  Rectal Bleeding  HPI: Lindsey [name] is a 42 y.o. female with Antithrombin III deficiency and history of 2 previous CVAs, and MIs who presents to the ED with complaints of rectal bleeding X 1 week.  She reports passing maroon colored stool and large clots.   She denies having ABD Pain, nausea or vomiting,   She also denies having fevers or chills.  She has been off of her Plavix and Aspirin therapy for 2 weeks in anticpation of a dental procedure.   And in the ED she was found to have an initial Hb = 14.2.   She was referred for admission.     Review of Systems: The patient denies anorexia, fever, chills, headaches, weight loss, vision loss, decreased hearing, hoarseness, chest pain, syncope, dyspnea on exertion, peripheral edema, balance deficits, hemoptysis, abdominal pain,  Nausea, vomiting, diarrhea, constipation, melena, severe indigestion/heartburn, dysuria, hematuria, incontinence, muscle weakness, suspicious skin lesions, transient blindness, difficulty walking, depression, unusual weight change, enlarged lymph nodes, angioedema, and breast masses.    Past Medical History  Diagnosis Date  . Stroke   . Blood transfusion   . Anxiety   . Coronary artery disease     Apical LAD infarction '00; NSTEMI s/p BMS to prox LAD '09; Cath 12/2011 single vessel CAD w/ patent LAD stent w/ stable mild ISR, nl LV systolic fxn  . Anemia   . Antithrombin III deficiency     ?pt not sure if true diagnosis  . Tobacco abuse   . High blood pressure      Past Surgical History  Procedure Laterality Date  . Tubal ligation    . Cardiac catheterization       Medications:  HOME MEDS: Prior to Admission medications   Medication Sig Start Date End Date Taking? Authorizing Provider  carvedilol (COREG) 6.25 MG tablet  Take 1 tablet (6.25 mg total) by mouth 2 (two) times daily with a meal. 12/24/11  Yes Jessica A Hope, PA-C  simvastatin (ZOCOR) 20 MG tablet Take 1 tablet (20 mg total) by mouth every evening. 12/24/11  Yes Jessica A Hope, PA-C  aspirin 81 MG tablet Take 1 tablet (81 mg total) by mouth daily. 06/30/11   Simbiso Ranga, MD  clopidogrel (PLAVIX) 75 MG tablet Take 1 tablet (75 mg total) by mouth daily. 12/24/11   Jessica A Hope, PA-C  nitroGLYCERIN (NITROSTAT) 0.4 MG SL tablet Place 1 tablet (0.4 mg total) under the tongue every 5 (five) minutes as needed for chest pain (up to 3 doses). 12/24/11   Jessica A Hope, PA-C    Allergies:  No Known Allergies  Social History:   reports that she has been smoking Cigarettes.  She has a .2 pack-year smoking history. She does not have any smokeless tobacco history on file. She reports that  drinks alcohol. She reports that she does not use illicit drugs.  Family History: Family History  Problem Relation Age of Onset  . Heart disease Brother     arrhythmia  . Breast cancer Maternal Aunt           Physical Exam:  GEN:  Pleasant 42 year old well nourished and well developed African American Female examined  and in no acute distress; cooperative with exam Filed Vitals:   06/04/12 1802  BP: 125/83  Pulse: 98  Temp: 98  F (36.7 C)  TempSrc: Oral  SpO2: 100%   Blood pressure 125/83, pulse 98, temperature 98 F (36.7 C), temperature source Oral, SpO2 100.00%. PSYCH: She is alert and oriented x4; does not appear anxious does not appear depressed; affect is normal HEENT: Normocephalic and Atraumatic, Mucous membranes pink; PERRLA; EOM intact; Fundi:  Benign;  No scleral icterus, Nares: Patent, Oropharynx: Clear, Fair Dentition, Neck:  FROM, no cervical lymphadenopathy nor thyromegaly or carotid bruit; no JVD; Breasts:: Not examined CHEST WALL: No tenderness CHEST: Normal respiration, clear to auscultation bilaterally HEART: Regular rate and rhythm; no  murmurs rubs or gallops BACK: No kyphosis or scoliosis; no CVA tenderness ABDOMEN: Positive Bowel Sounds,  Obese, soft non-tender; no masses, no organomegaly. Rectal Exam: Done by EDP, (HEME+ small external hemorrhoids present) EXTREMITIES: No cyanosis, clubbing or edema; no ulcerations. Genitalia: not examined PULSES: 2+ and symmetric SKIN: Normal hydration no rash or ulceration CNS: Cranial nerves 2-12 grossly intact no focal neurologic deficit    Labs & Imaging Results for orders placed during the hospital encounter of 06/04/12 (from the past 48 hour(s))  POCT I-STAT, CHEM 8     Status: Abnormal   Collection Time    06/04/12  7:32 PM      Result Value Range   Sodium 142  135 - 145 mEq/L   Potassium 3.3 (*) 3.5 - 5.1 mEq/L   Chloride 105  96 - 112 mEq/L   BUN 6  6 - 23 mg/dL   Creatinine, Ser 1.61  0.50 - 1.10 mg/dL   Glucose, Bld 096 (*) 70 - 99 mg/dL   Calcium, Ion 0.45  4.09 - 1.23 mmol/L   TCO2 28  0 - 100 mmol/L   Hemoglobin 14.3  12.0 - 15.0 g/dL   HCT 81.1  91.4 - 78.2 %  CBC WITH DIFFERENTIAL     Status: None   Collection Time    06/04/12  7:36 PM      Result Value Range   WBC 7.9  4.0 - 10.5 K/uL   RBC 4.16  3.87 - 5.11 MIL/uL   Hemoglobin 12.5  12.0 - 15.0 g/dL   HCT 95.6  21.3 - 08.6 %   MCV 92.5  78.0 - 100.0 fL   MCH 30.0  26.0 - 34.0 pg   MCHC 32.5  30.0 - 36.0 g/dL   RDW 57.8  46.9 - 62.9 %   Platelets 182  150 - 400 K/uL   Neutrophils Relative 69  43 - 77 %   Neutro Abs 5.5  1.7 - 7.7 K/uL   Lymphocytes Relative 17  12 - 46 %   Lymphs Abs 1.3  0.7 - 4.0 K/uL   Monocytes Relative 11  3 - 12 %   Monocytes Absolute 0.9  0.1 - 1.0 K/uL   Eosinophils Relative 2  0 - 5 %   Eosinophils Absolute 0.2  0.0 - 0.7 K/uL   Basophils Relative 1  0 - 1 %   Basophils Absolute 0.1  0.0 - 0.1 K/uL  OCCULT BLOOD, POC DEVICE     Status: Abnormal   Collection Time    06/04/12  8:16 PM      Result Value Range   Fecal Occult Bld POSITIVE (*) NEGATIVE      Assessment/Plan Principal Problem:   GI bleeding Active Problems:   HYPERTENSION   CAD, MI/V fib arrest + angioplasty 2000, PTCA and BM stent LAD 2009   Hypokalemia   Antithrombin III deficiency  1. GI Bleeding-  IV Protonix q 12 hrs, IVFs, Type and Screen sent, Check H/Hsq 8 hrs x 6,  Transfuse if needed.   Consult GI in AM, sooner PRN.    2. Hypokalemia- Replete K+, check Magnesium.    3.  HTN- stable on Meds, monitor.    4.  CAD- stable on meds.  On carvedilol, and simvastatin.  Plavix and ASA on Hold.     5.  Antithrombin III deficiency- off meds (Plavix and ASA) currently, continue to hold due to GI Bleeding.    6.   SCDs fopr DVT prophylaxis.       Code Status:    FULL CODE Family Communication:    No Family Present Disposition Plan:     Return to Home  Time spent: 62 Minutes  Ron Parker [facility] Pager [phone]  If 7PM-7AM, please contact night-coverage [email or web address] Password [facility] 06/04/2012, 11:54 PM

## 2012-06-04 NOTE — ED Notes (Signed)
Pt states she has been bleeding rectally since Wednesday and it increases a little more each day ,  Enough to make toilet bowl red.  Pt states a little nausea but denies vomiting.  She has stopped her ASA and Plavix two weeks ago due to a scheduled tooth extractions.  Pt states also never has had bleeding from rectum before and she has been on anti coagulates since 2000

## 2012-06-04 NOTE — ED Notes (Signed)
Pt is on plavix and on asa and stopped 2 weeks ago due to she is to have a tooth pulled. She has a hx of 2 cva and 2 MI.

## 2012-06-04 NOTE — ED Notes (Signed)
At bedside with Greta Doom he said no Iv at this time,  I was present while he did occult blood card

## 2012-06-05 DIAGNOSIS — I2109 ST elevation (STEMI) myocardial infarction involving other coronary artery of anterior wall: Secondary | ICD-10-CM

## 2012-06-05 DIAGNOSIS — K922 Gastrointestinal hemorrhage, unspecified: Secondary | ICD-10-CM

## 2012-06-05 DIAGNOSIS — D6859 Other primary thrombophilia: Secondary | ICD-10-CM

## 2012-06-05 LAB — BASIC METABOLIC PANEL
CO2: 23 mEq/L (ref 19–32)
Calcium: 8.4 mg/dL (ref 8.4–10.5)
Creatinine, Ser: 0.72 mg/dL (ref 0.50–1.10)
GFR calc Af Amer: >90 mL/min (ref >90)

## 2012-06-05 LAB — ABO/RH: ABO/RH(D): B POS

## 2012-06-05 LAB — CBC
MCV: 92.6 fL (ref 78.0–100.0)
Platelets: 159 10*3/uL (ref 150–400)
RDW: 15.5 % (ref 11.5–15.5)
WBC: 6.5 10*3/uL (ref 4.0–10.5)

## 2012-06-05 LAB — HEMOGLOBIN AND HEMATOCRIT, BLOOD
HCT: 30.5 % — ABNORMAL LOW (ref 36.0–46.0)
Hemoglobin: 9.9 g/dL — ABNORMAL LOW (ref 12.0–15.0)

## 2012-06-05 MED ORDER — SIMVASTATIN 20 MG PO TABS
20.0000 mg | ORAL_TABLET | Freq: Every evening | ORAL | Status: DC
  Administered 2012-06-05 – 2012-06-09 (×5): 20 mg via ORAL
  Filled 2012-06-05 (×5): qty 1

## 2012-06-05 MED ORDER — CARVEDILOL 6.25 MG PO TABS
6.2500 mg | ORAL_TABLET | Freq: Two times a day (BID) | ORAL | Status: DC
  Administered 2012-06-05 – 2012-06-09 (×10): 6.25 mg via ORAL
  Filled 2012-06-05 (×11): qty 1

## 2012-06-05 MED ORDER — PROMETHAZINE HCL 25 MG/ML IJ SOLN
25.0000 mg | Freq: Four times a day (QID) | INTRAMUSCULAR | Status: DC | PRN
  Administered 2012-06-05 – 2012-06-07 (×2): 25 mg via INTRAVENOUS
  Filled 2012-06-05 (×3): qty 1

## 2012-06-05 MED ORDER — NITROGLYCERIN 0.4 MG SL SUBL: 0.4000 mg | SUBLINGUAL_TABLET | SUBLINGUAL | Status: DC | PRN

## 2012-06-05 NOTE — Progress Notes (Signed)
Pt reports vomiting blood in trash can. Noted in trash can was vomitus with food contents brown in color. Some strings of blood clots noted mixed with the food contents. MD notified. New orders given. Vwilliams, rn.

## 2012-06-05 NOTE — Progress Notes (Signed)
Utilization review complete 

## 2012-06-05 NOTE — Progress Notes (Signed)
Patient ID: Lindsey [name], female   DOB: 1971-03-07, 42 y.o.   MRN: [id number]  [facility] PROGRESS NOTE  EUGENIE HAREWOOD JXB:[id number] DOB: [birth year] DOA: 06/04/2012 PCP: Valera Castle, MD  Brief narrative: Pt is 42 y.o. female with Antithrombin III deficiency and history of 2 previous CVAs, and MIs who presented to the ED with concern of rectal bleeding X 1 week. She reports passing maroon colored stool and large clots. She denies having abdominal pain, nausea or vomiting, She also denies having fevers or chills. She has been off of her Plavix and Aspirin therapy for 2 weeks in anticpation of a dental procedure. And in the ED she was found to have an initial Hb = 14.2. She was referred for admission.   Principal Problem:   GI bleeding - review of history indicates previous similar event in 2009 at which point it was noted that the bleed was diverticular in etiology - Hg trending down but pt is hemodynamically stable this morning - dicussed with Dr. Rhea Belton, I will keep him updated on pt's clinical status, if the condition stabilizes, an outpatient follow may be reasonable - will continue to trend H/H and if it continues to drop may need to have colonoscopy while inpatient  - continue to hold aspirin and plavix  Active Problems:   HYPERTENSION - reasonable inpatient control - monitor    CAD, MI/V fib arrest + angioplasty 2000, PTCA and BM stent LAD 2009 - stable clinical condition at this time    Hypokalemia - mild and present on admission - now supplemented and potassium is within normal limits this AM   Antithrombin III deficiency  Consultants:  Gi - over the phone   Procedures/Studies:  None  Antibiotics:  None  Code Status: Full Family Communication: Pt at bedside Disposition Plan: Home when medically stable  HPI/Subjective: No events overnight.   Objective: Filed Vitals:   06/05/12 0020 06/05/12 0120 06/05/12 0126 06/05/12 0532  BP: 129/92  137/90 122/87   Pulse: 83  71 73  Temp: 98.1 F (36.7 C)  98.6 F (37 C) 98.1 F (36.7 C)  TempSrc: Oral  Oral Oral  Resp: 18  18 18   Height:  5\' 6"  (1.676 m)    Weight:  83.7 kg (184 lb 8.4 oz)    SpO2: 99%  100% 100%    Intake/Output Summary (Last 24 hours) at 06/05/12 0914 Last data filed at 06/05/12 0805  Gross per 24 hour  Intake 506.67 ml  Output    600 ml  Net -93.33 ml    Exam:   General:  Pt is alert, follows commands appropriately, not in acute distress  Cardiovascular: Regular rate and rhythm, S1/S2, no murmurs, no rubs, no gallops  Respiratory: Clear to auscultation bilaterally, no wheezing, no crackles, no rhonchi  Abdomen: Soft, non tender, non distended, bowel sounds present, no guarding  Extremities: No edema, pulses DP and PT palpable bilaterally  Neuro: Grossly nonfocal  Data Reviewed: Basic Metabolic Panel:  Recent Labs Lab 06/04/12 1932 06/05/12 0507  NA 142 137  K 3.3* 3.6  CL 105 106  CO2  --  23  GLUCOSE 111* 100*  BUN 6 7  CREATININE 0.80 0.72  CALCIUM  --  8.4   CBC:  Recent Labs Lab 06/04/12 1932 06/04/12 1936 06/04/12 2130 06/05/12 0507 06/05/12 0751  WBC  --  7.9  --  6.5  --   NEUTROABS  --  5.5  --   --   --  HGB 14.3 12.5 12.4 10.6* 9.9*  HCT 42.0 38.5 37.8 32.4* 30.5*  MCV  --  92.5  --  92.6  --   PLT  --  182  --  159  --    Scheduled Meds: . sodium chloride   Intravenous STAT  . carvedilol  6.25 mg Oral BID WC  . pantoprazole (PROTONIX) IV  40 mg Intravenous Q12H  . simvastatin  20 mg Oral QPM  . sodium chloride  3 mL Intravenous Q12H   Continuous Infusions: . sodium chloride 100 mL/hr at 06/05/12 1610     Debbora Presto, MD  [facility] Pager [phone]  If 7PM-7AM, please contact night-coverage [email or web address] Password [facility] 06/05/2012, 9:14 AM   LOS: 1 day

## 2012-06-05 NOTE — ED Notes (Signed)
Attempted report call 

## 2012-06-05 NOTE — Plan of Care (Signed)
Problem: Phase II Progression Outcomes Goal: Tolerating diet Outcome: Not Met (add Reason) Patient had an episode of vomiting

## 2012-06-06 DIAGNOSIS — R079 Chest pain, unspecified: Secondary | ICD-10-CM

## 2012-06-06 LAB — HEMOGLOBIN AND HEMATOCRIT, BLOOD
HCT: 30.9 % — ABNORMAL LOW (ref 36.0–46.0)
Hemoglobin: 9.9 g/dL — ABNORMAL LOW (ref 12.0–15.0)

## 2012-06-06 LAB — BASIC METABOLIC PANEL
Calcium: 8.1 mg/dL — ABNORMAL LOW (ref 8.4–10.5)
Chloride: 106 mEq/L (ref 96–112)
Creatinine, Ser: 0.65 mg/dL (ref 0.50–1.10)
GFR calc Af Amer: >90 mL/min (ref >90)
GFR calc non Af Amer: >90 mL/min (ref >90)

## 2012-06-06 LAB — CBC
Platelets: 172 10*3/uL (ref 150–400)
RDW: 15 % (ref 11.5–15.5)
WBC: 6 10*3/uL (ref 4.0–10.5)

## 2012-06-06 MED ORDER — PROMETHAZINE HCL 25 MG/ML IJ SOLN
25.0000 mg | Freq: Once | INTRAMUSCULAR | Status: AC
  Administered 2012-06-06: 25 mg via INTRAVENOUS

## 2012-06-06 MED ORDER — GI COCKTAIL ~~LOC~~
30.0000 mL | Freq: Three times a day (TID) | ORAL | Status: DC | PRN
  Filled 2012-06-06: qty 30

## 2012-06-06 NOTE — Progress Notes (Signed)
Pt vomited x 1. Moderate amount of red-colored liquidy vomitus. Pt had had red jello and grape juice making it difficult to distinguish form blood. Vomitus with no odor. MD notified. New order given. Vwilliams, rn.

## 2012-06-06 NOTE — Progress Notes (Signed)
Patient ID: [name], female   DOB: 01-24-1971, 42 y.o.   MRN: [id number]  [facility] PROGRESS NOTE  ALIEGHA PAULLIN WUJ:[id number] DOB: [birth year] DOA: 06/04/2012 PCP: Valera Castle, MD   Brief narrative:  Pt is 42 y.o. female with Antithrombin III deficiency and history of 2 previous CVAs, and MIs who presented to the ED with concern of rectal bleeding X 1 week. She reports passing maroon colored stool and large clots. She denies having abdominal pain, nausea or vomiting, She also denies having fevers or chills. She has been off of her Plavix and Aspirin therapy for 2 weeks in anticpation of a dental procedure. And in the ED she was found to have an initial Hb = 14.2. She was referred for admission.   Principal Problem:  GI bleeding  - review of history indicates previous similar event in 2009 at which point it was noted that the bleed was diverticular in etiology  - Hg remains stable over 48 hours - will continue to monitor for now, if  Continues to drop, notify GI for possibly colonoscopy inpatient   - continue to hold aspirin and plavix  Active Problems:  HYPERTENSION  - reasonable inpatient control  - monitor  CAD, MI/V fib arrest + angioplasty 2000, PTCA and BM stent LAD 2009  - stable clinical condition at this time  Hypokalemia  - mild and present on admission  - now supplemented and potassium is within normal limits this AM  Antithrombin III deficiency   Consultants:  Gi - over the phone  Procedures/Studies:  None Antibiotics:  None  Code Status: Full  Family Communication: Pt at bedside  Disposition Plan: Home when medically stable   HPI/Subjective: No events overnight.   Objective: Filed Vitals:   06/05/12 2136 06/06/12 0140 06/06/12 0600 06/06/12 0945  BP: 121/84 115/94 119/72 152/86  Pulse: 72 64 80 71  Temp: 97.9 F (36.6 C) 98 F (36.7 C) 97.7 F (36.5 C) 98.6 F (37 C)  TempSrc: Oral Oral Oral Oral  Resp: 18 18 18 18   Height:      Weight:       SpO2: 100% 100% 100% 99%    Intake/Output Summary (Last 24 hours) at 06/06/12 1100 Last data filed at 06/06/12 0946  Gross per 24 hour  Intake 2368.89 ml  Output    156 ml  Net 2212.89 ml    Exam:   General:  Pt is alert, follows commands appropriately, not in acute distress  Cardiovascular: Regular rate and rhythm, S1/S2, no murmurs, no rubs, no gallops  Respiratory: Clear to auscultation bilaterally, no wheezing, no crackles, no rhonchi  Abdomen: Soft, non tender, non distended, bowel sounds present, no guarding  Extremities: No edema, pulses DP and PT palpable bilaterally  Neuro: Grossly nonfocal  Data Reviewed: Basic Metabolic Panel:  Recent Labs Lab 06/04/12 1932 06/05/12 0507 06/06/12 0716  NA 142 137 136  K 3.3* 3.6 3.6  CL 105 106 106  CO2  --  23 22  GLUCOSE 111* 100* 86  BUN 6 7 4*  CREATININE 0.80 0.72 0.65  CALCIUM  --  8.4 8.1*   CBC:  Recent Labs Lab 06/04/12 1936  06/05/12 0507 06/05/12 0751 06/05/12 1537 06/06/12 0001 06/06/12 0716  WBC 7.9  --  6.5  --   --   --  6.0  NEUTROABS 5.5  --   --   --   --   --   --   HGB 12.5  < >  10.6* 9.9* 9.9* 9.9* 10.0*  HCT 38.5  < > 32.4* 30.5* 31.0* 30.9* 31.3*  MCV 92.5  --  92.6  --   --   --  93.4  PLT 182  --  159  --   --   --  172  < > = values in this interval not displayed.  Scheduled Meds: . carvedilol  6.25 mg Oral BID WC  . pantoprazole (PROTONIX) IV  40 mg Intravenous Q12H  . simvastatin  20 mg Oral QPM  . sodium chloride  3 mL Intravenous Q12H   Continuous Infusions: . sodium chloride 100 mL/hr at 06/06/12 1610     Debbora Presto, MD  [facility] Pager [phone]  If 7PM-7AM, please contact night-coverage [email or web address] Password [email or web address] 06/06/2012, 11:00 AM   LOS: 2 days

## 2012-06-07 LAB — CBC
Hemoglobin: 9.9 g/dL — ABNORMAL LOW (ref 12.0–15.0)
RBC: 3.25 MIL/uL — ABNORMAL LOW (ref 3.87–5.11)
WBC: 6.7 10*3/uL (ref 4.0–10.5)

## 2012-06-07 LAB — BASIC METABOLIC PANEL
CO2: 25 mEq/L (ref 19–32)
Glucose, Bld: 85 mg/dL (ref 70–99)
Potassium: 3.3 mEq/L — ABNORMAL LOW (ref 3.5–5.1)
Sodium: 138 mEq/L (ref 135–145)

## 2012-06-07 MED ORDER — GI COCKTAIL ~~LOC~~
30.0000 mL | Freq: Three times a day (TID) | ORAL | Status: DC
  Administered 2012-06-07 – 2012-06-08 (×3): 30 mL via ORAL
  Filled 2012-06-07 (×5): qty 30

## 2012-06-07 MED ORDER — POTASSIUM CHLORIDE CRYS ER 20 MEQ PO TBCR
40.0000 meq | EXTENDED_RELEASE_TABLET | Freq: Once | ORAL | Status: AC
  Administered 2012-06-07: 40 meq via ORAL
  Filled 2012-06-07: qty 2

## 2012-06-07 NOTE — Consult Note (Signed)
Unassinged GI. Reason for Consult:Rectal bleeding. Referring Physician: [facility]-Dr. Izola Price.  Lindsey [name] is an 42 y.o. female.  HPI: 42 year old black female with multiple medical issues listed below, gives a history of rectal bleeding with the passage of clots for the last week. Today she claims she vomited some blood but as per Dr. Izola Price she had some red jello prior to vomiting. Patient claims she has epigastric pain as well. She was found to have diverticulosis and hemorrhoids on a previous colonoscopy done in 2009, done by Dr. Wendall Papa. She has had a drop in her hemoglobin from 12.5-10.6-9.9gm/dl.     Past Medical History  Diagnosis Date  . Stroke   . Blood transfusion   . Anxiety   . Coronary artery disease     Apical LAD infarction '00; NSTEMI s/p BMS to prox LAD '09; Cath 12/2011 single vessel CAD w/ patent LAD stent w/ stable mild ISR, nl LV systolic fxn  . Anemia   . Antithrombin III deficiency     ?pt not sure if true diagnosis  . Tobacco abuse   . High blood pressure    Past Surgical History  Procedure Laterality Date  . Tubal ligation    . Cardiac catheterization     Family History  Problem Relation Age of Onset  . Heart disease Brother     arrhythmia  . Breast cancer Maternal Aunt    Social History:  reports that she has been smoking Cigarettes.  She has a .2 pack-year smoking history. She does not have any smokeless tobacco history on file. She reports that  drinks alcohol. She reports that she does not use illicit drugs.  Allergies: No Known Allergies  Medications: I have reviewed the patient's current medications.  Results for orders placed during the hospital encounter of 06/04/12 (from the past 48 hour(s))  HEMOGLOBIN AND HEMATOCRIT, BLOOD     Status: Abnormal   Collection Time    06/06/12 12:01 AM      Result Value Range   Hemoglobin 9.9 (*) 12.0 - 15.0 g/dL   HCT 16.1 (*) 09.6 - 04.5 %  BASIC METABOLIC PANEL     Status: Abnormal   Collection Time   06/06/12  7:16 AM      Result Value Range   Sodium 136  135 - 145 mEq/L   Potassium 3.6  3.5 - 5.1 mEq/L   Chloride 106  96 - 112 mEq/L   CO2 22  19 - 32 mEq/L   Glucose, Bld 86  70 - 99 mg/dL   BUN 4 (*) 6 - 23 mg/dL   Creatinine, Ser 4.09  0.50 - 1.10 mg/dL   Calcium 8.1 (*) 8.4 - 10.5 mg/dL   GFR calc non Af Amer >90  >90 mL/min   GFR calc Af Amer >90  >90 mL/min   Comment:            The eGFR has been calculated     using the CKD EPI equation.     This calculation has not been     validated in all clinical     situations.     eGFR's persistently     <90 mL/min signify     possible Chronic Kidney Disease.  CBC     Status: Abnormal   Collection Time    06/06/12  7:16 AM      Result Value Range   WBC 6.0  4.0 - 10.5 K/uL   RBC 3.35 (*)  3.87 - 5.11 MIL/uL   Hemoglobin 10.0 (*) 12.0 - 15.0 g/dL   HCT 96.0 (*) 45.4 - 09.8 %   MCV 93.4  78.0 - 100.0 fL   MCH 29.9  26.0 - 34.0 pg   MCHC 31.9  30.0 - 36.0 g/dL   RDW 11.9  14.7 - 82.9 %   Platelets 172  150 - 400 K/uL  CBC     Status: Abnormal   Collection Time    06/07/12  5:02 AM      Result Value Range   WBC 6.7  4.0 - 10.5 K/uL   RBC 3.25 (*) 3.87 - 5.11 MIL/uL   Hemoglobin 9.9 (*) 12.0 - 15.0 g/dL   HCT 56.2 (*) 13.0 - 86.5 %   MCV 93.8  78.0 - 100.0 fL   MCH 30.5  26.0 - 34.0 pg   MCHC 32.5  30.0 - 36.0 g/dL   RDW 78.4  69.6 - 29.5 %   Platelets 171  150 - 400 K/uL  BASIC METABOLIC PANEL     Status: Abnormal   Collection Time    06/07/12  5:02 AM      Result Value Range   Sodium 138  135 - 145 mEq/L   Potassium 3.3 (*) 3.5 - 5.1 mEq/L   Chloride 107  96 - 112 mEq/L   CO2 25  19 - 32 mEq/L   Glucose, Bld 85  70 - 99 mg/dL   BUN 3 (*) 6 - 23 mg/dL   Creatinine, Ser 2.84  0.50 - 1.10 mg/dL   Calcium 8.5  8.4 - 13.2 mg/dL   GFR calc non Af Amer >90  >90 mL/min   GFR calc Af Amer >90  >90 mL/min   Comment:            The eGFR has been calculated     using the CKD EPI equation.     This calculation has not  been     validated in all clinical     situations.     eGFR's persistently     <90 mL/min signify     possible Chronic Kidney Disease.   No results found.  Review of Systems  Constitutional: Negative.   HENT: Negative.   Eyes: Negative.   Respiratory: Negative.   Cardiovascular: Negative.   Gastrointestinal: Positive for nausea, vomiting and blood in stool.  Genitourinary: Negative.   Musculoskeletal: Negative.   Skin: Negative.   Endo/Heme/Allergies: Negative for environmental allergies and polydipsia. Bruises/bleeds easily.  Psychiatric/Behavioral: Negative.    Blood pressure 132/86, pulse 60, temperature 98.4 F (36.9 C), temperature source Oral, resp. rate 18, height 5\' 6"  (1.676 m), weight 83.7 kg (184 lb 8.4 oz), SpO2 98.00%. Physical Exam  Constitutional: She is oriented to person, place, and time. Vital signs are normal. She appears well-developed and well-nourished.  HENT:  Head: Normocephalic and atraumatic.  Eyes: Conjunctivae and EOM are normal. Pupils are equal, round, and reactive to light.  Neck: Normal range of motion. Neck supple.  Cardiovascular: Normal rate and regular rhythm.   Respiratory: Effort normal and breath sounds normal.  GI: Soft. Bowel sounds are normal. She exhibits no distension and no mass. There is no tenderness. There is no rebound and no guarding.  Musculoskeletal: Normal range of motion.  Neurological: She is alert and oriented to person, place, and time.  Skin: Skin is warm and dry.  Psychiatric: She has a normal mood and affect. Her behavior is normal. Judgment and  thought content normal.   Assessment/Plan: 1) Rectal bleeding with ?hematemesis: I have spoken to her nurse and ask her to document if there is any blood in her vomitus or stool. We will observe for another day and decide if another colonoscopy is warranted. She may need an EGD and a colonoscopy prior to discharge if she continues to bleed.  2) HTN/CAD/S/P MI /V. Fibrillation  with cardiac arrest in 2000. 3) TIA in 2007.  4) ?Antithrombin 3 deficiency.  5) Morbid obesity.  Savvas Roper 06/07/2012, 9:02 PM

## 2012-06-07 NOTE — Progress Notes (Signed)
Patient ID: Lindsey [name], female   DOB: 10-Dec-1970, 42 y.o.   MRN: [id number]  [facility] PROGRESS NOTE  Lindsey [name]:[id number] DOB: 10-16-1970 DOA: 06/04/2012 PCP: Valera Castle, MD  Brief narrative:  Pt is 42 y.o. female with Antithrombin III deficiency and history of 2 previous CVAs, and MIs who presented to the ED with concern of rectal bleeding X 1 week. She reports passing maroon colored stool and large clots. She denies having abdominal pain, nausea or vomiting, She also denies having fevers or chills. She has been off of her Plavix and Aspirin therapy for 2 weeks in anticpation of a dental procedure. And in the ED she was found to have an initial Hb = 14.2. She was referred for admission.   Principal Problem:  GI bleeding  - review of history indicates previous similar event in 2009 at which point it was noted that the bleed was diverticular in etiology  - Hg remains stable over 48 hours  - will continue to monitor for now and GI for any additional recommendations   - continue to hold aspirin and plavix  Active Problems:  HYPERTENSION  - reasonable inpatient control  - monitor  CAD, MI/V fib arrest + angioplasty 2000, PTCA and BM stent LAD 2009  - stable clinical condition at this time  Hypokalemia  - mild - will supplemented  Antithrombin III deficiency   Consultants:  GI Procedures/Studies:  None Antibiotics:  None  Code Status: Full  Family Communication: Pt at bedside  Disposition Plan:  likely in AM if no plan for endoscopy/colonoscopy    HPI/Subjective: No events overnight.   Objective: Filed Vitals:   06/06/12 0945 06/06/12 1810 06/06/12 2104 06/07/12 0554  BP: 152/86 129/87 134/90 120/79  Pulse: 71 64 58 88  Temp: 98.6 F (37 C) 97.9 F (36.6 C) 98.3 F (36.8 C) 98.1 F (36.7 C)  TempSrc: Oral Oral Oral Oral  Resp: 18 19 20 20   Height:      Weight:      SpO2: 99% 98% 99% 96%    Intake/Output Summary (Last 24 hours) at 06/07/12  1431 Last data filed at 06/07/12 1302  Gross per 24 hour  Intake   2950 ml  Output    200 ml  Net   2750 ml    Exam:   General:  Pt is alert, follows commands appropriately, not in acute distress  Cardiovascular: Regular rate and rhythm, S1/S2, no murmurs, no rubs, no gallops  Respiratory: Clear to auscultation bilaterally, no wheezing, no crackles, no rhonchi  Abdomen: Soft, non tender, non distended, bowel sounds present, no guarding  Extremities: No edema, pulses DP and PT palpable bilaterally  Neuro: Grossly nonfocal  Data Reviewed: Basic Metabolic Panel:  Recent Labs Lab 06/04/12 1932 06/05/12 0507 06/06/12 0716 06/07/12 0502  NA 142 137 136 138  K 3.3* 3.6 3.6 3.3*  CL 105 106 106 107  CO2  --  23 22 25   GLUCOSE 111* 100* 86 85  BUN 6 7 4* 3*  CREATININE 0.80 0.72 0.65 0.67  CALCIUM  --  8.4 8.1* 8.5   CBC:  Recent Labs Lab 06/04/12 1936  06/05/12 0507 06/05/12 0751 06/05/12 1537 06/06/12 0001 06/06/12 0716 06/07/12 0502  WBC 7.9  --  6.5  --   --   --  6.0 6.7  NEUTROABS 5.5  --   --   --   --   --   --   --  HGB 12.5  < > 10.6* 9.9* 9.9* 9.9* 10.0* 9.9*  HCT 38.5  < > 32.4* 30.5* 31.0* 30.9* 31.3* 30.5*  MCV 92.5  --  92.6  --   --   --  93.4 93.8  PLT 182  --  159  --   --   --  172 171  < > = values in this interval not displayed.  Scheduled Meds: . carvedilol  6.25 mg Oral BID WC  . pantoprazole (PROTONIX) IV  40 mg Intravenous Q12H  . simvastatin  20 mg Oral QPM  . sodium chloride  3 mL Intravenous Q12H   Continuous Infusions: . sodium chloride 100 mL/hr at 06/07/12 1037     Debbora Presto, MD  [facility] Pager [phone]  If 7PM-7AM, please contact night-coverage [email or web address] Password [facility] 06/07/2012, 2:31 PM   LOS: 3 days

## 2012-06-08 LAB — BASIC METABOLIC PANEL
CO2: 25 mEq/L (ref 19–32)
Chloride: 106 mEq/L (ref 96–112)
Creatinine, Ser: 0.77 mg/dL (ref 0.50–1.10)
Glucose, Bld: 108 mg/dL — ABNORMAL HIGH (ref 70–99)
Sodium: 137 mEq/L (ref 135–145)

## 2012-06-08 LAB — CBC
HCT: 28.4 % — ABNORMAL LOW (ref 36.0–46.0)
MCH: 30.2 pg (ref 26.0–34.0)
MCV: 92.2 fL (ref 78.0–100.0)
RBC: 3.08 MIL/uL — ABNORMAL LOW (ref 3.87–5.11)
WBC: 5.7 10*3/uL (ref 4.0–10.5)

## 2012-06-08 MED ORDER — LORAZEPAM 0.5 MG PO TABS
0.5000 mg | ORAL_TABLET | Freq: Once | ORAL | Status: AC
  Administered 2012-06-08: 0.5 mg via ORAL
  Filled 2012-06-08: qty 1

## 2012-06-08 MED ORDER — GI COCKTAIL ~~LOC~~
30.0000 mL | Freq: Four times a day (QID) | ORAL | Status: DC
  Administered 2012-06-09 (×2): 30 mL via ORAL
  Filled 2012-06-08 (×4): qty 30

## 2012-06-08 NOTE — Progress Notes (Signed)
Patient ID: Lindsey [name], female   DOB: Dec 16, 1970, 42 y.o.   MRN: [id number]  [facility] PROGRESS NOTE  Lindsey [name] UVO:[id number] DOB: April 23, 1970 DOA: 06/04/2012 PCP: Valera Castle, MD  Brief narrative:  Pt is 42 y.o. female with Antithrombin III deficiency and history of 2 previous CVAs, and MIs who presented to the ED with concern of rectal bleeding X 1 week. She reports passing maroon colored stool and large clots. She denies having abdominal pain, nausea or vomiting, She also denies having fevers or chills. She has been off of her Plavix and Aspirin therapy for 2 weeks in anticpation of a dental procedure. And in the ED she was found to have an initial Hb = 14.2. She was referred for admission.   Principal Problem:  GI bleeding  - review of history indicates previous similar event in 2009 at which point it was noted that the bleed was diverticular in etiology  - Hg overall stable but slight drop from yesterday noted 9.9 --> 9.3 - will continue to monitor for now and GI for any additional recommendations, appreciate GI input   - continue to hold aspirin and plavix  Active Problems:  HYPERTENSION  - reasonable inpatient control  - monitor  CAD, MI/V fib arrest + angioplasty 2000, PTCA and BM stent LAD 2009  - stable clinical condition at this time  Hypokalemia  - supplemented and within normal limits this AM Antithrombin III deficiency   Consultants:  GI Procedures/Studies:  None Antibiotics:  None  Code Status: Full  Family Communication: Pt at bedside  Disposition Plan: likely in AM if no plan for endoscopy/colonoscopy   HPI/Subjective: No events overnight.   Objective: Filed Vitals:   06/07/12 1400 06/07/12 2303 06/08/12 0443 06/08/12 1335  BP: 132/86 127/89 129/82 119/79  Pulse: 60 85 73 77  Temp: 98.4 F (36.9 C) 98.1 F (36.7 C) 98.4 F (36.9 C) 98.8 F (37.1 C)  TempSrc: Oral Oral Oral Oral  Resp: 18 20 20 17   Height:      Weight:      SpO2: 98%  98% 96% 97%    Intake/Output Summary (Last 24 hours) at 06/08/12 1607 Last data filed at 06/08/12 0800  Gross per 24 hour  Intake   1290 ml  Output      0 ml  Net   1290 ml    Exam:   General:  Pt is alert, follows commands appropriately, not in acute distress  Cardiovascular: Regular rate and rhythm, S1/S2, no murmurs, no rubs, no gallops  Respiratory: Clear to auscultation bilaterally, no wheezing, no crackles, no rhonchi  Abdomen: Soft, non tender, non distended, bowel sounds present, no guarding  Extremities: No edema, pulses DP and PT palpable bilaterally  Neuro: Grossly nonfocal  Data Reviewed: Basic Metabolic Panel:  Recent Labs Lab 06/04/12 1932 06/05/12 0507 06/06/12 0716 06/07/12 0502 06/08/12 0450  NA 142 137 136 138 137  K 3.3* 3.6 3.6 3.3* 3.7  CL 105 106 106 107 106  CO2  --  23 22 25 25   GLUCOSE 111* 100* 86 85 108*  BUN 6 7 4* 3* <3*  CREATININE 0.80 0.72 0.65 0.67 0.77  CALCIUM  --  8.4 8.1* 8.5 8.5   CBC:  Recent Labs Lab 06/04/12 1936  06/05/12 0507  06/05/12 1537 06/06/12 0001 06/06/12 0716 06/07/12 0502 06/08/12 0450  WBC 7.9  --  6.5  --   --   --  6.0 6.7 5.7  NEUTROABS 5.5  --   --   --   --   --   --   --   --  HGB 12.5  < > 10.6*  < > 9.9* 9.9* 10.0* 9.9* 9.3*  HCT 38.5  < > 32.4*  < > 31.0* 30.9* 31.3* 30.5* 28.4*  MCV 92.5  --  92.6  --   --   --  93.4 93.8 92.2  PLT 182  --  159  --   --   --  172 171 154  < > = values in this interval not displayed.  Scheduled Meds: . carvedilol  6.25 mg Oral BID WC  . gi cocktail  30 mL Oral TID  . pantoprazole (PROTONIX) IV  40 mg Intravenous Q12H  . simvastatin  20 mg Oral QPM  . sodium chloride  3 mL Intravenous Q12H   Continuous Infusions: . sodium chloride 100 mL/hr at 06/07/12 2059     Lindsey Presto, MD  [facility] Pager [phone]  If 7PM-7AM, please contact night-coverage [email or web address] Password [email or web address] 06/08/2012, 4:07 PM   LOS: 4 days

## 2012-06-08 NOTE — Progress Notes (Signed)
Subjective: Vomited some blood this AM.  No further hematochezia.  Objective: Vital signs in last 24 hours: Temp:  [98.1 F (36.7 C)-98.8 F (37.1 C)] 98.8 F (37.1 C) (03/25 1335) Pulse Rate:  [73-85] 77 (03/25 1335) Resp:  [17-20] 17 (03/25 1335) BP: (119-129)/(79-89) 119/79 mmHg (03/25 1335) SpO2:  [96 %-98 %] 97 % (03/25 1335) Last BM Date: 06/05/12  Intake/Output from previous day: 03/24 0701 - 03/25 0700 In: 2258.3 [P.O.:960; I.V.:1298.3] Out: 200 [Urine:200] Intake/Output this shift: Total I/O In: 360 [P.O.:360] Out: -   General appearance: alert and no distress GI: soft, non-tender; bowel sounds normal; no masses,  no organomegaly  Lab Results:  Recent Labs  06/06/12 0716 06/07/12 0502 06/08/12 0450  WBC 6.0 6.7 5.7  HGB 10.0* 9.9* 9.3*  HCT 31.3* 30.5* 28.4*  PLT 172 171 154   BMET  Recent Labs  06/06/12 0716 06/07/12 0502 06/08/12 0450  NA 136 138 137  K 3.6 3.3* 3.7  CL 106 107 106  CO2 22 25 25   GLUCOSE 86 85 108*  BUN 4* 3* <3*  CREATININE 0.65 0.67 0.77  CALCIUM 8.1* 8.5 8.5   LFT No results found for this basename: PROT, ALBUMIN, AST, ALT, ALKPHOS, BILITOT, BILIDIR, IBILI,  in the last 72 hours PT/INR No results found for this basename: LABPROT, INR,  in the last 72 hours Hepatitis Panel No results found for this basename: HEPBSAG, HCVAB, HEPAIGM, HEPBIGM,  in the last 72 hours C-Diff No results found for this basename: CDIFFTOX,  in the last 72 hours Fecal Lactopherrin No results found for this basename: FECLLACTOFRN,  in the last 72 hours  Studies/Results: No results found.  Medications:  Scheduled: . carvedilol  6.25 mg Oral BID WC  . [START ON 06/09/2012] gi cocktail  30 mL Oral QID  . pantoprazole (PROTONIX) IV  40 mg Intravenous Q12H  . simvastatin  20 mg Oral QPM  . sodium chloride  3 mL Intravenous Q12H   Continuous: . sodium chloride 100 mL/hr at 06/07/12 2059    Assessment/Plan: 1) Hematemesis. 2) Hematochezia  in the setting of diverticula.   With her persistent complaints of hematemesis I will perform an EGD tomorrow.   I think her hematochezia was secondary to a diverticular bleed.  Plan: 1) EGD tomorrow.  LOS: 4 days   Eliza Grissinger D 06/08/2012, 5:04 PM

## 2012-06-09 ENCOUNTER — Encounter (HOSPITAL_COMMUNITY): Payer: Self-pay

## 2012-06-09 ENCOUNTER — Inpatient Hospital Stay (HOSPITAL_COMMUNITY): Payer: MEDICAID

## 2012-06-09 ENCOUNTER — Encounter (HOSPITAL_COMMUNITY)
Admission: EM | Disposition: A | Discharge status: Home / Self Care | Payer: Self-pay | Source: Home / Self Care | Attending: Internal Medicine

## 2012-06-09 DIAGNOSIS — I2510 Atherosclerotic heart disease of native coronary artery without angina pectoris: Secondary | ICD-10-CM

## 2012-06-09 HISTORY — PX: ESOPHAGOGASTRODUODENOSCOPY: SHX5428

## 2012-06-09 LAB — BASIC METABOLIC PANEL
BUN: 3 mg/dL — ABNORMAL LOW (ref 6–23)
Calcium: 8.5 mg/dL (ref 8.4–10.5)
Creatinine, Ser: 0.69 mg/dL (ref 0.50–1.10)
GFR calc non Af Amer: >90 mL/min (ref >90)
Glucose, Bld: 90 mg/dL (ref 70–99)

## 2012-06-09 LAB — CBC
HCT: 29.1 % — ABNORMAL LOW (ref 36.0–46.0)
Hemoglobin: 9.5 g/dL — ABNORMAL LOW (ref 12.0–15.0)
MCH: 30 pg (ref 26.0–34.0)
MCHC: 32.6 g/dL (ref 30.0–36.0)

## 2012-06-09 SURGERY — EGD (ESOPHAGOGASTRODUODENOSCOPY)
Anesthesia: Moderate Sedation

## 2012-06-09 MED ORDER — SODIUM CHLORIDE 0.9 % IV SOLN: INTRAVENOUS | Status: DC

## 2012-06-09 MED ORDER — ONDANSETRON HCL 4 MG PO TABS: 4.0000 mg | ORAL_TABLET | Freq: Four times a day (QID) | ORAL | Status: DC | PRN

## 2012-06-09 MED ORDER — ASPIRIN 81 MG PO TABS: 81.0000 mg | ORAL_TABLET | Freq: Every day | ORAL | Status: DC

## 2012-06-09 MED ORDER — CLOPIDOGREL BISULFATE 75 MG PO TABS: 75.0000 mg | ORAL_TABLET | Freq: Every day | ORAL | Status: DC

## 2012-06-09 MED ORDER — FENTANYL CITRATE 0.05 MG/ML IJ SOLN
INTRAMUSCULAR | Status: DC | PRN
  Administered 2012-06-09 (×2): 25 ug via INTRAVENOUS
  Administered 2012-06-09: 10 ug via INTRAVENOUS

## 2012-06-09 MED ORDER — OMEPRAZOLE 40 MG PO CPDR: 40.0000 mg | DELAYED_RELEASE_CAPSULE | Freq: Two times a day (BID) | ORAL | Status: DC

## 2012-06-09 MED ORDER — MIDAZOLAM HCL 10 MG/2ML IJ SOLN
INTRAMUSCULAR | Status: DC | PRN
  Administered 2012-06-09 (×3): 2 mg via INTRAVENOUS

## 2012-06-09 MED ORDER — HYDRALAZINE HCL 20 MG/ML IJ SOLN: 10.0000 mg | Freq: Four times a day (QID) | INTRAMUSCULAR | Status: DC | PRN

## 2012-06-09 NOTE — H&P (View-Only) (Signed)
Subjective: Vomited some blood this AM.  No further hematochezia.  Objective: Vital signs in last 24 hours: Temp:  [98.1 F (36.7 C)-98.8 F (37.1 C)] 98.8 F (37.1 C) (03/25 1335) Pulse Rate:  [73-85] 77 (03/25 1335) Resp:  [17-20] 17 (03/25 1335) BP: (119-129)/(79-89) 119/79 mmHg (03/25 1335) SpO2:  [96 %-98 %] 97 % (03/25 1335) Last BM Date: 06/05/12  Intake/Output from previous day: 03/24 0701 - 03/25 0700 In: 2258.3 [P.O.:960; I.V.:1298.3] Out: 200 [Urine:200] Intake/Output this shift: Total I/O In: 360 [P.O.:360] Out: -   General appearance: alert and no distress GI: soft, non-tender; bowel sounds normal; no masses,  no organomegaly  Lab Results:  Recent Labs  06/06/12 0716 06/07/12 0502 06/08/12 0450  WBC 6.0 6.7 5.7  HGB 10.0* 9.9* 9.3*  HCT 31.3* 30.5* 28.4*  PLT 172 171 154   BMET  Recent Labs  06/06/12 0716 06/07/12 0502 06/08/12 0450  NA 136 138 137  K 3.6 3.3* 3.7  CL 106 107 106  CO2 22 25 25  GLUCOSE 86 85 108*  BUN 4* 3* <3*  CREATININE 0.65 0.67 0.77  CALCIUM 8.1* 8.5 8.5   LFT No results found for this basename: PROT, ALBUMIN, AST, ALT, ALKPHOS, BILITOT, BILIDIR, IBILI,  in the last 72 hours PT/INR No results found for this basename: LABPROT, INR,  in the last 72 hours Hepatitis Panel No results found for this basename: HEPBSAG, HCVAB, HEPAIGM, HEPBIGM,  in the last 72 hours C-Diff No results found for this basename: CDIFFTOX,  in the last 72 hours Fecal Lactopherrin No results found for this basename: FECLLACTOFRN,  in the last 72 hours  Studies/Results: No results found.  Medications:  Scheduled: . carvedilol  6.25 mg Oral BID WC  . [START ON 06/09/2012] gi cocktail  30 mL Oral QID  . pantoprazole (PROTONIX) IV  40 mg Intravenous Q12H  . simvastatin  20 mg Oral QPM  . sodium chloride  3 mL Intravenous Q12H   Continuous: . sodium chloride 100 mL/hr at 06/07/12 2059    Assessment/Plan: 1) Hematemesis. 2) Hematochezia  in the setting of diverticula.   With her persistent complaints of hematemesis I will perform an EGD tomorrow.   I think her hematochezia was secondary to a diverticular bleed.  Plan: 1) EGD tomorrow.  LOS: 4 days   Lindsey [name] D 06/08/2012, 5:04 PM  

## 2012-06-09 NOTE — Discharge Summary (Signed)
[facility]                                                                                   Lindsey [name], is a 42 y.o. female  DOB 01/02/1971  MRN [id number].  Admission date:  06/04/2012  Discharge Date:  06/09/2012  Primary MD  Valera Castle, MD  Admitting Physician  Ron Parker, MD  Admission Diagnosis  GI bleed [578.9]  Discharge Diagnosis     Principal Problem:   GI bleeding Active Problems:   HYPERTENSION   CAD, MI/V fib arrest + angioplasty 2000, PTCA and BM stent LAD 2009   Hypokalemia   Antithrombin III deficiency    Past Medical History  Diagnosis Date  . Stroke   . Blood transfusion   . Anxiety   . Coronary artery disease     Apical LAD infarction '00; NSTEMI s/p BMS to prox LAD '09; Cath 12/2011 single vessel CAD w/ patent LAD stent w/ stable mild ISR, nl LV systolic fxn  . Anemia   . Antithrombin III deficiency     ?pt not sure if true diagnosis  . Tobacco abuse   . High blood pressure     Past Surgical History  Procedure Laterality Date  . Tubal ligation    . Cardiac catheterization       Recommendations for primary care physician for things to follow:   Follow CBC BMP closely, please reevaluate the need of dual antiplatelet therapy.   Discharge Diagnoses:   Principal Problem:   GI bleeding Active Problems:   HYPERTENSION   CAD, MI/V fib arrest + angioplasty 2000, PTCA and BM stent LAD 2009   Hypokalemia   Antithrombin III deficiency    Discharge Condition:Stable   Diet recommendation: See Discharge Instructions below   Consults GI-Hung    History of present illness and  Hospital Course:     Kindly see H&P for history of present illness and admission details, please review complete Labs, Consult reports and Test reports for all details in brief Lindsey [name], is a 42 y.o. female  with ?? H/O Antithrombin III deficiency (patient unsure of this diagnosis) and history of 2 previous CVAs, and MIs who  presented to the ED with concern of rectal bleeding X 1 week. She reported passing maroon colored stool and large clots. She denied having abdominal pain, nausea or vomiting, She also denies having fevers or chills. She has been off of her Plavix and Aspirin therapy for 2 weeks in anticpation of a dental procedure. And in the ED she was found to have an initial Hb = 14.2. She was referred for admission. She was seen by GI physician Dr. Orson Slick it was thought that her lower GI bleed was likely due to diverticular disease, this has since resolved, however patient in the hospital had some episodes of hematemesis none in the last 36 hours, however for this problem she underwent EGD by Dr. Elnoria Howard  Which was NML except mild H Hernia, D/W Dr hung Rip Harbour for DC. She will be discharged home on PPI, aspirin and Plavix to be resumed.    She has underlying history of hypertension, CAD,  MI/ V fib arrest + angioplasty 2000, PTCA and BM stent LAD 2009 which are stable, home medications will be continued except aspirin Plavix resumed, I will defer it to patient's PCP and primary cardiologist need for long-term dual antiplatelet therapy.    Today   Subjective:   Lindsey [name] today has no headache,no chest abdominal pain,no new weakness tingling or numbness, feels much better wants to go home today.    Objective:   Blood pressure 143/95, pulse 66, temperature 97.6 F (36.4 C), temperature source Oral, resp. rate 16, height 5\' 6"  (1.676 m), weight 83.7 kg (184 lb 8.4 oz), SpO2 100.00%.   Intake/Output Summary (Last 24 hours) at 06/09/12 1416 Last data filed at 06/09/12 0513  Gross per 24 hour  Intake 2541.66 ml  Output   1900 ml  Net 641.66 ml    Exam Awake Alert, Oriented *3, No new F.N deficits, Normal affect Lindsey [name].AT,PERRAL Supple Neck,No JVD, No cervical lymphadenopathy appriciated.  Symmetrical Chest wall movement, Good air movement bilaterally, CTAB RRR,No Gallops,Rubs or new Murmurs, No Parasternal  Heave +ve B.Sounds, Abd Soft, Non tender, No organomegaly appriciated, No rebound -guarding or rigidity. No Cyanosis, Clubbing or edema, No new Rash or bruise  Data Review   Major procedures and Radiology Reports - PLEASE review detailed and final reports for all details in brief -     No results found.  Micro Results      No results found for this or any previous visit (from the past 240 hour(s)).   CBC w Diff: Lab Results  Component Value Date   WBC 6.1 06/09/2012   HGB 9.5* 06/09/2012   HCT 29.1* 06/09/2012   PLT 170 06/09/2012   LYMPHOPCT 17 06/04/2012   BANDSPCT 0 12/23/2010   MONOPCT 11 06/04/2012   EOSPCT 2 06/04/2012   BASOPCT 1 06/04/2012    CMP: Lab Results  Component Value Date   NA 138 06/09/2012   K 3.7 06/09/2012   CL 106 06/09/2012   CO2 25 06/09/2012   BUN 3* 06/09/2012   CREATININE 0.69 06/09/2012   PROT 6.1 06/30/2011   ALBUMIN 2.8* 06/30/2011   BILITOT 0.3 06/30/2011   ALKPHOS 68 06/30/2011   AST 13 06/30/2011   ALT 10 06/30/2011  .   Discharge Instructions     Follow with Primary MD Valera Castle, MD in 3-4 days   Get CBC, CMP, checked 3-4 days by Primary MD and again as instructed by your Primary MD.    Get Medicines reviewed and adjusted.  Please request your Prim.MD to go over all Hospital Tests and Procedure/Radiological results at the follow up, please get all Hospital records sent to your Prim MD by signing hospital release before you go home.  Activity: As tolerated with Full fall precautions use walker/cane & assistance as needed   Diet: Heart Healthy  For Heart failure patients - Check your Weight same time everyday, if you gain over 2 pounds, or you develop in leg swelling, experience more shortness of breath or chest pain, call your Primary MD immediately. Follow Cardiac Low Salt Diet and 1.8 lit/day fluid restriction.  Disposition Home   If you experience worsening of your admission symptoms, develop shortness of breath, life threatening  emergency, suicidal or homicidal thoughts you must seek medical attention immediately by calling 911 or calling your MD immediately  if symptoms less severe.  You Must read complete instructions/literature along with all the possible adverse reactions/side effects for all the Medicines you take and  that have been prescribed to you. Take any new Medicines after you have completely understood and accpet all the possible adverse reactions/side effects.   Do not drive and provide baby sitting services if your were admitted for syncope or siezures until you have seen by Primary MD or a Neurologist and advised to do so again.  Do not drive when taking Pain medications.    Do not take more than prescribed Pain, Sleep and Anxiety Medications  Special Instructions: If you have smoked or chewed Tobacco  in the last 2 yrs please stop smoking, stop any regular Alcohol  and or any Recreational drug use.  Wear Seat belts while driving.        Follow-up Information   Follow up with Valera Castle, MD. Schedule an appointment as soon as possible for a visit in 3 days.   Contact information:   [address] [place] [address] [phone]       Follow up with Theda Belfast, MD. Schedule an appointment as soon as possible for a visit in 1 week.   Contact information:   [address] [place] [address] [phone]         Discharge Medications     Medication List    TAKE these medications       aspirin 81 MG tablet  Take 1 tablet (81 mg total) by mouth daily.     carvedilol 6.25 MG tablet  Commonly known as:  COREG  Take 1 tablet (6.25 mg total) by mouth 2 (two) times daily with a meal.     clopidogrel 75 MG tablet  Commonly known as:  PLAVIX  Take 1 tablet (75 mg total) by mouth daily.     nitroGLYCERIN 0.4 MG SL tablet  Commonly known as:  NITROSTAT  Place 1 tablet (0.4 mg total) under the tongue every 5 (five) minutes as needed for chest  pain (up to 3 doses).     omeprazole 40 MG capsule  Commonly known as:  PRILOSEC  Take 1 capsule (40 mg total) by mouth 2 (two) times daily.     ondansetron 4 MG tablet  Commonly known as:  ZOFRAN  Take 1 tablet (4 mg total) by mouth every 6 (six) hours as needed for nausea.     simvastatin 20 MG tablet  Commonly known as:  ZOCOR  Take 1 tablet (20 mg total) by mouth every evening.           Total Time in preparing paper work, data evaluation and todays exam - 35 minutes  Leroy Sea M.D on 06/09/2012 at 2:16 PM  [facility] Office  [phone]

## 2012-06-09 NOTE — Progress Notes (Signed)
Discharge instructions and medications reveiwed with with pt. Pt stable and has no complaints at time of discharge. KUB completed and negative for small bowel obstruction. MD aware.   Arta Bruce [facility]

## 2012-06-09 NOTE — Interval H&P Note (Signed)
History and Physical Interval Note:  06/09/2012 12:25 PM  Lindsey [name]  has presented today for surgery, with the diagnosis of Hematemesis  The various methods of treatment have been discussed with the patient and family. After consideration of risks, benefits and other options for treatment, the patient has consented to  Procedure(s): ESOPHAGOGASTRODUODENOSCOPY (EGD) (N/A) as a surgical intervention .  The patient's history has been reviewed, patient examined, no change in status, stable for surgery.  I have reviewed the patient's chart and labs.  Questions were answered to the patient's satisfaction.     [name]

## 2012-06-09 NOTE — Op Note (Signed)
[facility] [address] [place], [address]   OPERATIVE PROCEDURE REPORT  PATIENT: Lindsey [name], Lindsey [name]  MR#: [id number] BIRTHDATE: 01-Jun-1970  GENDER: Female ENDOSCOPIST: Jeani Hawking, MD ASSISTANT:   Jeneen Montgomery, RN and Annie Sable, technician PROCEDURE DATE: 06/09/2012 PROCEDURE:   EGD, diagnostic ASA CLASS:   Class III INDICATIONS:Hematemesis. MEDICATIONS: Versed 6 mg IV and Fentanyl 60 mcg IV TOPICAL ANESTHETIC:   Cetacaine Spray  DESCRIPTION OF PROCEDURE:   After the risks benefits and alternatives of the procedure were thoroughly explained, informed consent was obtained.  The     endoscope was introduced through the mouth  and advanced to the second portion of the duodenum Without limitations.      The instrument was slowly withdrawn as the mucosa was fully examined.      FINDINGS: : A 2 cm hiatal hernia was noted.  No other abnormalities were identified in the upper GI tract.   Retroflexed views revealed no abnormalities.     The scope was then withdrawn from the patient and the procedure terminated.  COMPLICATIONS: There were no complications. IMPRESSION: 1) 2 cm hiatal hernia  RECOMMENDATIONS: 1) PPI QD x 1 month. 2) No further GI evaluation.  Signing off.   _______________________________ Rosalie DoctorJeani Hawking, MD 06/09/2012 12:52 PM

## 2012-06-10 ENCOUNTER — Encounter (HOSPITAL_COMMUNITY): Payer: Self-pay | Admitting: Gastroenterology

## 2012-08-30 ENCOUNTER — Encounter (HOSPITAL_COMMUNITY): Payer: Self-pay | Admitting: *Deleted

## 2012-08-30 ENCOUNTER — Emergency Department (HOSPITAL_COMMUNITY)
Admission: EM | Admit: 2012-08-30 | Discharge: 2012-08-31 | Disposition: A | Discharge status: Home / Self Care | Payer: Self-pay | Attending: Emergency Medicine | Admitting: Emergency Medicine

## 2012-08-30 DIAGNOSIS — R197 Diarrhea, unspecified: Secondary | ICD-10-CM | POA: Insufficient documentation

## 2012-08-30 DIAGNOSIS — Z3202 Encounter for pregnancy test, result negative: Secondary | ICD-10-CM | POA: Insufficient documentation

## 2012-08-30 DIAGNOSIS — Z862 Personal history of diseases of the blood and blood-forming organs and certain disorders involving the immune mechanism: Secondary | ICD-10-CM | POA: Insufficient documentation

## 2012-08-30 DIAGNOSIS — Z8659 Personal history of other mental and behavioral disorders: Secondary | ICD-10-CM | POA: Insufficient documentation

## 2012-08-30 DIAGNOSIS — R1084 Generalized abdominal pain: Secondary | ICD-10-CM | POA: Insufficient documentation

## 2012-08-30 DIAGNOSIS — I2510 Atherosclerotic heart disease of native coronary artery without angina pectoris: Secondary | ICD-10-CM | POA: Insufficient documentation

## 2012-08-30 DIAGNOSIS — F17200 Nicotine dependence, unspecified, uncomplicated: Secondary | ICD-10-CM | POA: Insufficient documentation

## 2012-08-30 DIAGNOSIS — Z9851 Tubal ligation status: Secondary | ICD-10-CM | POA: Insufficient documentation

## 2012-08-30 DIAGNOSIS — Z79899 Other long term (current) drug therapy: Secondary | ICD-10-CM | POA: Insufficient documentation

## 2012-08-30 DIAGNOSIS — R1110 Vomiting, unspecified: Secondary | ICD-10-CM | POA: Insufficient documentation

## 2012-08-30 DIAGNOSIS — Z8673 Personal history of transient ischemic attack (TIA), and cerebral infarction without residual deficits: Secondary | ICD-10-CM | POA: Insufficient documentation

## 2012-08-30 DIAGNOSIS — R748 Abnormal levels of other serum enzymes: Secondary | ICD-10-CM | POA: Insufficient documentation

## 2012-08-30 DIAGNOSIS — T783XXA Angioneurotic edema, initial encounter: Secondary | ICD-10-CM | POA: Insufficient documentation

## 2012-08-30 DIAGNOSIS — R000 Tachycardia, unspecified: Secondary | ICD-10-CM | POA: Insufficient documentation

## 2012-08-30 DIAGNOSIS — Z7982 Long term (current) use of aspirin: Secondary | ICD-10-CM | POA: Insufficient documentation

## 2012-08-30 DIAGNOSIS — I10 Essential (primary) hypertension: Secondary | ICD-10-CM | POA: Insufficient documentation

## 2012-08-30 DIAGNOSIS — R6883 Chills (without fever): Secondary | ICD-10-CM | POA: Insufficient documentation

## 2012-08-30 DIAGNOSIS — Z9861 Coronary angioplasty status: Secondary | ICD-10-CM | POA: Insufficient documentation

## 2012-08-30 DIAGNOSIS — I252 Old myocardial infarction: Secondary | ICD-10-CM | POA: Insufficient documentation

## 2012-08-30 LAB — COMPREHENSIVE METABOLIC PANEL
Albumin: 3.7 g/dL (ref 3.5–5.2)
Alkaline Phosphatase: 129 U/L — ABNORMAL HIGH (ref 39–117)
BUN: 7 mg/dL (ref 6–23)
Creatinine, Ser: 0.8 mg/dL (ref 0.50–1.10)
GFR calc Af Amer: >90 mL/min (ref >90)
Glucose, Bld: 136 mg/dL — ABNORMAL HIGH (ref 70–99)
Potassium: 3.7 mEq/L (ref 3.5–5.1)
Total Bilirubin: 1 mg/dL (ref 0.3–1.2)
Total Protein: 8 g/dL (ref 6.0–8.3)

## 2012-08-30 LAB — CBC WITH DIFFERENTIAL/PLATELET
Basophils Relative: 1 % (ref 0–1)
Eosinophils Absolute: 0.1 10*3/uL (ref 0.0–0.7)
HCT: 38.3 % (ref 36.0–46.0)
Hemoglobin: 12.5 g/dL (ref 12.0–15.0)
Lymphs Abs: 0.9 10*3/uL (ref 0.7–4.0)
MCH: 29 pg (ref 26.0–34.0)
MCHC: 32.6 g/dL (ref 30.0–36.0)
MCV: 88.9 fL (ref 78.0–100.0)
Monocytes Absolute: 0.6 10*3/uL (ref 0.1–1.0)
Monocytes Relative: 8 % (ref 3–12)
Neutrophils Relative %: 78 % — ABNORMAL HIGH (ref 43–77)
RBC: 4.31 MIL/uL (ref 3.87–5.11)

## 2012-08-30 LAB — URINALYSIS, ROUTINE W REFLEX MICROSCOPIC
Glucose, UA: NEGATIVE mg/dL
Ketones, ur: >80 mg/dL — AB
Nitrite: NEGATIVE
Specific Gravity, Urine: 1.023 (ref 1.005–1.030)
pH: 5 (ref 5.0–8.0)

## 2012-08-30 LAB — GLUCOSE, CAPILLARY: Glucose-Capillary: 136 mg/dL — ABNORMAL HIGH (ref 70–99)

## 2012-08-30 LAB — POCT PREGNANCY, URINE: Preg Test, Ur: NEGATIVE

## 2012-08-30 LAB — URINE MICROSCOPIC-ADD ON

## 2012-08-30 MED ORDER — METHYLPREDNISOLONE SODIUM SUCC 125 MG IJ SOLR
125.0000 mg | Freq: Once | INTRAMUSCULAR | Status: AC
  Administered 2012-08-30: 125 mg via INTRAVENOUS
  Filled 2012-08-30: qty 2

## 2012-08-30 MED ORDER — SODIUM CHLORIDE 0.9 % IV BOLUS (SEPSIS)
2000.0000 mL | Freq: Once | INTRAVENOUS | Status: AC
  Administered 2012-08-30: 2000 mL via INTRAVENOUS

## 2012-08-30 MED ORDER — ONDANSETRON HCL 4 MG/2ML IJ SOLN
4.0000 mg | Freq: Once | INTRAMUSCULAR | Status: AC
Start: 2012-08-30 — End: 2012-08-30
  Administered 2012-08-30: 4 mg via INTRAVENOUS
  Filled 2012-08-30: qty 2

## 2012-08-30 MED ORDER — FAMOTIDINE IN NACL 20-0.9 MG/50ML-% IV SOLN
20.0000 mg | Freq: Once | INTRAVENOUS | Status: AC
  Administered 2012-08-31: 20 mg via INTRAVENOUS
  Filled 2012-08-30: qty 50

## 2012-08-30 MED ORDER — DIPHENHYDRAMINE HCL 50 MG/ML IJ SOLN
50.0000 mg | Freq: Once | INTRAMUSCULAR | Status: AC
  Administered 2012-08-30: 50 mg via INTRAVENOUS
  Filled 2012-08-30: qty 1

## 2012-08-30 NOTE — ED Notes (Signed)
Pt reports that she began with her top lip swelling 2 days ago and that has progressed to both lips; pt reports that her hands and feet have been swelling over the past few days; pt c/o nausea and vomiting; pt states that she has vomited more than 20 times in the last 24 hrs; pt c/o feeling jittery; 1 episode of diarrhea today.

## 2012-08-30 NOTE — ED Provider Notes (Signed)
History     CSN: [id number]  Arrival date & time 08/30/12  1918   First MD Initiated Contact with Patient 08/30/12 2201      Chief Complaint  Patient presents with  . Facial Swelling  . Emesis    (Consider location/radiation/quality/duration/timing/severity/associated sxs/prior treatment) HPI This 42 year old female has a history of prior stroke prior coronary artery disease but now presents with a three-day history of swollen nonitching upper lip with no change in medicines or other known exposures, she also has 2 days of some chills suspected fever and multiple episodes per day of nonbloody vomiting and diarrhea with more vomiting and diarrhea, she has had some intermittent diffuse abdominal cramping pains and no localized or severe or constant pain, she has no cough chest pain shortness of breath, she does feel somewhat dehydrated, she is no confusion rash dysuria, she is on her menses has no pelvic pain or discharge. There is no treatment prior to arrival. She is no tongue swelling. She is no rash. She has no generalized pruritus to her body. She might have slight itching to her upper lip the last 2 days but not today. Past Medical History  Diagnosis Date  . Stroke   . Blood transfusion   . Anxiety   . Coronary artery disease     Apical LAD infarction '00; NSTEMI s/p BMS to prox LAD '09; Cath 12/2011 single vessel CAD w/ patent LAD stent w/ stable mild ISR, nl LV systolic fxn  . Anemia   . Antithrombin III deficiency     ?pt not sure if true diagnosis  . Tobacco abuse   . High blood pressure     Past Surgical History  Procedure Laterality Date  . Tubal ligation    . Cardiac catheterization    . Esophagogastroduodenoscopy N/A 06/09/2012    Procedure: ESOPHAGOGASTRODUODENOSCOPY (EGD);  Surgeon: Theda Belfast, MD;  Location: [facility];  Service: Endoscopy;  Laterality: N/A;    Family History  Problem Relation Age of Onset  . Heart disease Brother     arrhythmia  .  Breast cancer Maternal Aunt     History  Substance Use Topics  . Smoking status: Current Some Day Smoker -- 0.20 packs/day for 1 years    Types: Cigarettes  . Smokeless tobacco: Not on file  . Alcohol Use: 0.0 oz/week    0 Cans of beer per week     Comment: occasion    OB History   Grav Para Term Preterm Abortions TAB SAB Ect Mult Living                  Review of Systems 10 Systems reviewed and are negative for acute change except as noted in the HPI. Allergies  Review of patient's allergies indicates no known allergies.  Home Medications   Current Outpatient Rx  Name  Route  Sig  Dispense  Refill  . acetaminophen (TYLENOL) 325 MG tablet   Oral   Take 650 mg by mouth every 6 (six) hours as needed for pain.         Marland Kitchen aspirin 325 MG tablet   Oral   Take 325 mg by mouth every morning.         . carvedilol (COREG) 6.25 MG tablet   Oral   Take 1 tablet (6.25 mg total) by mouth 2 (two) times daily with a meal.   60 tablet   6   . clopidogrel (PLAVIX) 75 MG tablet   Oral  Take 1 tablet (75 mg total) by mouth daily.   30 tablet   6   . diphenhydrAMINE (BENADRYL) 25 MG tablet   Oral   Take 25 mg by mouth every 6 (six) hours as needed for itching or allergies (swelling).         . nitroGLYCERIN (NITROSTAT) 0.4 MG SL tablet   Sublingual   Place 1 tablet (0.4 mg total) under the tongue every 5 (five) minutes as needed for chest pain (up to 3 doses).   25 tablet   3   . pantoprazole (PROTONIX) 20 MG tablet   Oral   Take 20 mg by mouth every morning.         . diphenhydrAMINE (BENADRYL) 25 MG tablet   Oral   Take 2 tablets (50 mg total) by mouth every 4 (four) hours as needed for itching.   20 tablet   0   . famotidine (PEPCID) 20 MG tablet   Oral   Take 1 tablet (20 mg total) by mouth 2 (two) times daily.   10 tablet   0   . loratadine (CLARITIN) 10 MG tablet   Oral   Take 1 tablet (10 mg total) by mouth daily. One po daily x 5 days   5  tablet   0   . metoCLOPramide (REGLAN) 10 MG tablet   Oral   Take 1 tablet (10 mg total) by mouth every 6 (six) hours as needed (nausea/headache).   6 tablet   0   . ondansetron (ZOFRAN ODT) 8 MG disintegrating tablet      8mg  ODT q4 hours prn nausea   4 tablet   0   . predniSONE (DELTASONE) 20 MG tablet      2 tabs po daily x 3 days   6 tablet   0     BP 139/97  Pulse 90  Temp(Src) 99.3 F (37.4 C) (Oral)  Resp 14  Ht 5\' 6"  (1.676 m)  Wt 183 lb (83.008 kg)  BMI 29.55 kg/m2  SpO2 99%  LMP 08/30/2012  Physical Exam  Nursing note and vitals reviewed. Constitutional:  Awake, alert, nontoxic appearance.  HENT:  Head: Atraumatic.  Mildly edematous upper lip but tongue is normal airway is patent and maintained with normal speech no stridor no drooling no airway compromise  Eyes: Right eye exhibits no discharge. Left eye exhibits no discharge.  Neck: Neck supple.  Cardiovascular: Regular rhythm.   No murmur heard. Tachycardic  Pulmonary/Chest: Effort normal and breath sounds normal. No respiratory distress. She has no wheezes. She has no rales. She exhibits no tenderness.  Abdominal: Soft. Bowel sounds are normal. She exhibits no distension and no mass. There is no tenderness. There is no rebound and no guarding.  Musculoskeletal: She exhibits no edema and no tenderness.  Baseline ROM, no obvious new focal weakness.  Neurological: She is alert.  Mental status and motor strength appears baseline for patient and situation.  Skin: No rash noted.  Psychiatric: She has a normal mood and affect.    ED Course  Procedures (including critical care time) Patient / Family / Caregiver understand and agree with initial ED impression and plan with expectations set for ED visit. Pt feels improved after observation and/or treatment in ED. No nausea, lip stable, Pt eating food. Patient informed of clinical course, understand medical decision-making process, and agree with  plan. Labs Reviewed  URINALYSIS, ROUTINE W REFLEX MICROSCOPIC - Abnormal; Notable for the following:    Color,  Urine RED (*)    APPearance CLOUDY (*)    Hgb urine dipstick LARGE (*)    Bilirubin Urine SMALL (*)    Ketones, ur >80 (*)    Protein, ur 100 (*)    Leukocytes, UA TRACE (*)    All other components within normal limits  CBC WITH DIFFERENTIAL - Abnormal; Notable for the following:    RDW 16.6 (*)    Neutrophils Relative % 78 (*)    All other components within normal limits  COMPREHENSIVE METABOLIC PANEL - Abnormal; Notable for the following:    Glucose, Bld 136 (*)    AST 81 (*)    ALT 48 (*)    Alkaline Phosphatase 129 (*)    GFR calc non Af Amer 89 (*)    All other components within normal limits  GLUCOSE, CAPILLARY - Abnormal; Notable for the following:    Glucose-Capillary 136 (*)    All other components within normal limits  URINE MICROSCOPIC-ADD ON - Abnormal; Notable for the following:    Squamous Epithelial / LPF FEW (*)    Casts HYALINE CASTS (*)    All other components within normal limits  POCT PREGNANCY, URINE   No results found.   1. Angioedema of lips, initial encounter   2. Vomiting and diarrhea   3. Elevated liver enzymes       MDM  I doubt any other EMC precluding discharge at this time including, but not necessarily limited to the following:SBI, anaphylaxis.        Hurman Horn, MD 08/31/12 [phone]

## 2012-08-31 MED ORDER — PREDNISONE 20 MG PO TABS: ORAL_TABLET | ORAL | Status: DC

## 2012-08-31 MED ORDER — ONDANSETRON 8 MG PO TBDP: ORAL_TABLET | ORAL | Status: DC

## 2012-08-31 MED ORDER — DIPHENHYDRAMINE HCL 25 MG PO TABS: 50.0000 mg | ORAL_TABLET | ORAL | Status: DC | PRN

## 2012-08-31 MED ORDER — METOCLOPRAMIDE HCL 10 MG PO TABS: 10.0000 mg | ORAL_TABLET | Freq: Four times a day (QID) | ORAL | Status: DC | PRN

## 2012-08-31 MED ORDER — FAMOTIDINE 20 MG PO TABS: 20.0000 mg | ORAL_TABLET | Freq: Two times a day (BID) | ORAL | Status: DC

## 2012-08-31 MED ORDER — LORATADINE 10 MG PO TABS: 10.0000 mg | ORAL_TABLET | Freq: Every day | ORAL | Status: DC

## 2012-10-10 ENCOUNTER — Emergency Department (HOSPITAL_BASED_OUTPATIENT_CLINIC_OR_DEPARTMENT_OTHER): Payer: Self-pay

## 2012-10-10 ENCOUNTER — Emergency Department (HOSPITAL_BASED_OUTPATIENT_CLINIC_OR_DEPARTMENT_OTHER)
Admission: EM | Admit: 2012-10-10 | Discharge: 2012-10-11 | Disposition: A | Discharge status: Home / Self Care | Payer: Self-pay | Attending: Emergency Medicine | Admitting: Emergency Medicine

## 2012-10-10 ENCOUNTER — Encounter (HOSPITAL_BASED_OUTPATIENT_CLINIC_OR_DEPARTMENT_OTHER): Payer: Self-pay | Admitting: *Deleted

## 2012-10-10 DIAGNOSIS — Z8679 Personal history of other diseases of the circulatory system: Secondary | ICD-10-CM | POA: Insufficient documentation

## 2012-10-10 DIAGNOSIS — Z7902 Long term (current) use of antithrombotics/antiplatelets: Secondary | ICD-10-CM | POA: Insufficient documentation

## 2012-10-10 DIAGNOSIS — R1084 Generalized abdominal pain: Secondary | ICD-10-CM | POA: Insufficient documentation

## 2012-10-10 DIAGNOSIS — R1110 Vomiting, unspecified: Secondary | ICD-10-CM | POA: Insufficient documentation

## 2012-10-10 DIAGNOSIS — F17200 Nicotine dependence, unspecified, uncomplicated: Secondary | ICD-10-CM | POA: Insufficient documentation

## 2012-10-10 DIAGNOSIS — Z79899 Other long term (current) drug therapy: Secondary | ICD-10-CM | POA: Insufficient documentation

## 2012-10-10 DIAGNOSIS — F411 Generalized anxiety disorder: Secondary | ICD-10-CM | POA: Insufficient documentation

## 2012-10-10 DIAGNOSIS — I2510 Atherosclerotic heart disease of native coronary artery without angina pectoris: Secondary | ICD-10-CM | POA: Insufficient documentation

## 2012-10-10 DIAGNOSIS — Z7982 Long term (current) use of aspirin: Secondary | ICD-10-CM | POA: Insufficient documentation

## 2012-10-10 DIAGNOSIS — Z862 Personal history of diseases of the blood and blood-forming organs and certain disorders involving the immune mechanism: Secondary | ICD-10-CM | POA: Insufficient documentation

## 2012-10-10 DIAGNOSIS — IMO0002 Reserved for concepts with insufficient information to code with codable children: Secondary | ICD-10-CM | POA: Insufficient documentation

## 2012-10-10 DIAGNOSIS — Z8673 Personal history of transient ischemic attack (TIA), and cerebral infarction without residual deficits: Secondary | ICD-10-CM | POA: Insufficient documentation

## 2012-10-10 DIAGNOSIS — K5641 Fecal impaction: Secondary | ICD-10-CM | POA: Insufficient documentation

## 2012-10-10 NOTE — ED Notes (Signed)
Pt reports no BM in 4 days.  Reports abdominal pain, vomiting.

## 2012-10-10 NOTE — ED Provider Notes (Signed)
CSN: [id number]     Arrival date & time 10/10/12  2147 History  This chart was scribed for Hanley Seamen, MD by Bennett Scrape, ED Scribe. This patient was seen in room MH07/MH07 and the patient's care was started at 11:14 PM.   First MD Initiated Contact with Patient 10/10/12 2312     Chief Complaint  Patient presents with  . Constipation    The history is provided by the patient. No language interpreter was used.    HPI Comments: Lindsey [name] is a 42 y.o. female who presents to the Emergency Department complaining of 4 days of constipation described as no BM. She lists abdominal pain that developed yesterday and has been gradually worsening today and 6 to 7 episodes of non-bloody emesis today as associated symptoms. She describes the abdominal pain as a generalized, sharp and constant and reports that she has tried Miralax and one enema at home with no improvement. She denies any prior episodes of constipation this severe and states that she is currently having pain and nausea. She admits that she was treated with narcotics for CP one week ago which is the only recent deviation from her normal routine.   Past Medical History  Diagnosis Date  . Stroke   . Blood transfusion   . Anxiety   . Coronary artery disease     Apical LAD infarction '00; NSTEMI s/p BMS to prox LAD '09; Cath 12/2011 single vessel CAD w/ patent LAD stent w/ stable mild ISR, nl LV systolic fxn  . Anemia   . Antithrombin III deficiency     ?pt not sure if true diagnosis  . Tobacco abuse   . High blood pressure    Past Surgical History  Procedure Laterality Date  . Tubal ligation    . Cardiac catheterization    . Esophagogastroduodenoscopy N/A 06/09/2012    Procedure: ESOPHAGOGASTRODUODENOSCOPY (EGD);  Surgeon: Theda Belfast, MD;  Location: [facility];  Service: Endoscopy;  Laterality: N/A;   Family History  Problem Relation Age of Onset  . Heart disease Brother     arrhythmia  . Breast cancer  Maternal Aunt    History  Substance Use Topics  . Smoking status: Current Some Day Smoker -- 0.20 packs/day for 1 years    Types: Cigarettes  . Smokeless tobacco: Not on file  . Alcohol Use: 0.0 oz/week    0 Cans of beer per week     Comment: occasion   No OB history provided.  Review of Systems  A complete 10 system review of systems was obtained and all systems are negative except as noted in the HPI and PMH.   Allergies  Review of patient's allergies indicates no known allergies.  Home Medications   Current Outpatient Rx  Name  Route  Sig  Dispense  Refill  . acetaminophen (TYLENOL) 325 MG tablet   Oral   Take 650 mg by mouth every 6 (six) hours as needed for pain.         Marland Kitchen aspirin 325 MG tablet   Oral   Take 325 mg by mouth every morning.         . carvedilol (COREG) 6.25 MG tablet   Oral   Take 1 tablet (6.25 mg total) by mouth 2 (two) times daily with a meal.   60 tablet   6   . clopidogrel (PLAVIX) 75 MG tablet   Oral   Take 1 tablet (75 mg total) by mouth daily.  30 tablet   6   . diphenhydrAMINE (BENADRYL) 25 MG tablet   Oral   Take 25 mg by mouth every 6 (six) hours as needed for itching or allergies (swelling).         . diphenhydrAMINE (BENADRYL) 25 MG tablet   Oral   Take 2 tablets (50 mg total) by mouth every 4 (four) hours as needed for itching.   20 tablet   0   . famotidine (PEPCID) 20 MG tablet   Oral   Take 1 tablet (20 mg total) by mouth 2 (two) times daily.   10 tablet   0   . loratadine (CLARITIN) 10 MG tablet   Oral   Take 1 tablet (10 mg total) by mouth daily. One po daily x 5 days   5 tablet   0   . metoCLOPramide (REGLAN) 10 MG tablet   Oral   Take 1 tablet (10 mg total) by mouth every 6 (six) hours as needed (nausea/headache).   6 tablet   0   . nitroGLYCERIN (NITROSTAT) 0.4 MG SL tablet   Sublingual   Place 1 tablet (0.4 mg total) under the tongue every 5 (five) minutes as needed for chest pain (up to 3  doses).   25 tablet   3   . ondansetron (ZOFRAN ODT) 8 MG disintegrating tablet      8mg  ODT q4 hours prn nausea   4 tablet   0   . pantoprazole (PROTONIX) 20 MG tablet   Oral   Take 20 mg by mouth every morning.         . predniSONE (DELTASONE) 20 MG tablet      2 tabs po daily x 3 days   6 tablet   0     Physical Exam  Nursing note and vitals reviewed.  Triage Vitals: BP 152/101  Pulse 99  Temp(Src) 98.8 F (37.1 C) (Oral)  Resp 20  Ht 5\' 6"  (1.676 m)  Wt 180 lb (81.647 kg)  BMI 29.07 kg/m2  SpO2 100%  General Appearance:    Alert, cooperative, no distress, appears stated age  Head:    Normocephalic, without obvious abnormality, atraumatic  Eyes:    PERRL, conjunctiva/corneas clear, EOM's intact, fundi    benign, both eyes     Nose:   Nares normal, septum midline, mucosa normal, no drainage    or sinus tenderness  Throat:   Lips, mucosa, and tongue normal; teeth and gums normal  Neck:   Supple, symmetrical, trachea midline  Back:     Symmetric, no curvature, ROM normal, no CVA tenderness  Lungs:     Clear to auscultation bilaterally, respirations unlabored  Chest Wall:    No tenderness or deformity   Heart:    Regular rate and rhythm, S1 and S2 normal, no murmur, rub   or gallop     Abdomen:     Soft, generalized discomfort but not frank tenderness of the entire abdomen, bowel sounds mildly hyperactive in all four quadrants, no masses, no organomegaly     Rectal:    Normal sphincter tone, soft fecal impaction, no masses or tenderness; chaperone present  Extremities:   Extremities normal, atraumatic, no cyanosis or edema  Pulses:   2+ and symmetric all extremities  Skin:   Skin color, texture, turgor normal, no rashes or lesions          ED Course   Procedures (including critical care time)  DIAGNOSTIC STUDIES: Oxygen Saturation is  100% on room air, normal by my interpretation.    COORDINATION OF CARE: 11:14 PM-Pt going to radiology. 11:43  PM-Informed pt of x-ray showing a moderate amount of constipation. Discussed treatment plan which includes rectal exam with pt at bedside and pt agreed to plan.   No diagnosis found.  MDM  Nursing notes and vitals signs, including pulse oximetry, reviewed.  Summary of this visit's results, reviewed by myself:  Imaging Studies: Dg Abd Acute W/chest  10/10/2012   *RADIOLOGY REPORT*  Clinical Data: Abdominal pain, constipation and vomiting.  ACUTE ABDOMEN SERIES (ABDOMEN 2 VIEW & CHEST 1 VIEW)  Comparison: 06/09/2012  Findings: The chest shows clear lungs without evidence of edema or infiltrate.  Heart size is normal.  Abdominal films show no evidence of bowel obstruction or significant ileus.  There are some scattered air-fluid levels identified in predominately colon which may be consistent with enteritis.  No free air or abnormal calcifications are seen.  Clips are seen related to prior tubal ligation.  Visualized bony structures are unremarkable.  IMPRESSION: Air-fluid levels in nondilated colon suggestive of enteritis.   Original Report Authenticated By: Irish Lack, M.D.   1:05 AM Significant bowel movement after the soapsuds enema.   Hanley Seamen, MD 10/11/12 [id number]

## 2013-08-01 ENCOUNTER — Encounter (HOSPITAL_COMMUNITY): Payer: Self-pay | Admitting: Emergency Medicine

## 2013-08-01 ENCOUNTER — Inpatient Hospital Stay (HOSPITAL_COMMUNITY)
Admission: EM | Admit: 2013-08-01 | Discharge: 2013-08-05 | DRG: 438 | Disposition: A | Discharge status: Home / Self Care | Payer: Medicaid Other | Attending: Internal Medicine | Admitting: Internal Medicine

## 2013-08-01 ENCOUNTER — Inpatient Hospital Stay (HOSPITAL_COMMUNITY): Payer: Medicaid Other

## 2013-08-01 DIAGNOSIS — I513 Intracardiac thrombosis, not elsewhere classified: Secondary | ICD-10-CM | POA: Diagnosis present

## 2013-08-01 DIAGNOSIS — Z8249 Family history of ischemic heart disease and other diseases of the circulatory system: Secondary | ICD-10-CM

## 2013-08-01 DIAGNOSIS — D6859 Other primary thrombophilia: Secondary | ICD-10-CM | POA: Diagnosis present

## 2013-08-01 DIAGNOSIS — Z9861 Coronary angioplasty status: Secondary | ICD-10-CM

## 2013-08-01 DIAGNOSIS — K2960 Other gastritis without bleeding: Secondary | ICD-10-CM | POA: Diagnosis present

## 2013-08-01 DIAGNOSIS — I252 Old myocardial infarction: Secondary | ICD-10-CM

## 2013-08-01 DIAGNOSIS — E872 Acidosis, unspecified: Secondary | ICD-10-CM | POA: Diagnosis present

## 2013-08-01 DIAGNOSIS — K920 Hematemesis: Secondary | ICD-10-CM | POA: Diagnosis present

## 2013-08-01 DIAGNOSIS — Z803 Family history of malignant neoplasm of breast: Secondary | ICD-10-CM

## 2013-08-01 DIAGNOSIS — R7402 Elevation of levels of lactic acid dehydrogenase (LDH): Secondary | ICD-10-CM

## 2013-08-01 DIAGNOSIS — K449 Diaphragmatic hernia without obstruction or gangrene: Secondary | ICD-10-CM | POA: Diagnosis present

## 2013-08-01 DIAGNOSIS — K226 Gastro-esophageal laceration-hemorrhage syndrome: Secondary | ICD-10-CM | POA: Diagnosis present

## 2013-08-01 DIAGNOSIS — R740 Nonspecific elevation of levels of transaminase and lactic acid dehydrogenase [LDH]: Secondary | ICD-10-CM

## 2013-08-01 DIAGNOSIS — I10 Essential (primary) hypertension: Secondary | ICD-10-CM | POA: Diagnosis present

## 2013-08-01 DIAGNOSIS — R109 Unspecified abdominal pain: Secondary | ICD-10-CM

## 2013-08-01 DIAGNOSIS — I749 Embolism and thrombosis of unspecified artery: Secondary | ICD-10-CM | POA: Diagnosis present

## 2013-08-01 DIAGNOSIS — K209 Esophagitis, unspecified without bleeding: Secondary | ICD-10-CM | POA: Diagnosis present

## 2013-08-01 DIAGNOSIS — I2510 Atherosclerotic heart disease of native coronary artery without angina pectoris: Secondary | ICD-10-CM

## 2013-08-01 DIAGNOSIS — Z8673 Personal history of transient ischemic attack (TIA), and cerebral infarction without residual deficits: Secondary | ICD-10-CM

## 2013-08-01 DIAGNOSIS — Z7901 Long term (current) use of anticoagulants: Secondary | ICD-10-CM

## 2013-08-01 DIAGNOSIS — K922 Gastrointestinal hemorrhage, unspecified: Secondary | ICD-10-CM

## 2013-08-01 DIAGNOSIS — K760 Fatty (change of) liver, not elsewhere classified: Secondary | ICD-10-CM | POA: Diagnosis present

## 2013-08-01 DIAGNOSIS — K859 Acute pancreatitis without necrosis or infection, unspecified: Principal | ICD-10-CM | POA: Diagnosis present

## 2013-08-01 DIAGNOSIS — K2990 Gastroduodenitis, unspecified, without bleeding: Secondary | ICD-10-CM | POA: Diagnosis present

## 2013-08-01 DIAGNOSIS — K2980 Duodenitis without bleeding: Secondary | ICD-10-CM | POA: Diagnosis present

## 2013-08-01 DIAGNOSIS — F17200 Nicotine dependence, unspecified, uncomplicated: Secondary | ICD-10-CM | POA: Diagnosis present

## 2013-08-01 DIAGNOSIS — E785 Hyperlipidemia, unspecified: Secondary | ICD-10-CM | POA: Diagnosis present

## 2013-08-01 DIAGNOSIS — K5790 Diverticulosis of intestine, part unspecified, without perforation or abscess without bleeding: Secondary | ICD-10-CM | POA: Diagnosis present

## 2013-08-01 HISTORY — DX: Acute myocardial infarction, unspecified: I21.9

## 2013-08-01 HISTORY — DX: Transient cerebral ischemic attack, unspecified: G45.9

## 2013-08-01 LAB — CBC WITH DIFFERENTIAL/PLATELET
BASOS ABS: 0 10*3/uL (ref 0.0–0.1)
BASOS PCT: 0 % (ref 0–1)
EOS ABS: 0.1 10*3/uL (ref 0.0–0.7)
Eosinophils Relative: 1 % (ref 0–5)
HCT: 33.9 % — ABNORMAL LOW (ref 36.0–46.0)
Hemoglobin: 11 g/dL — ABNORMAL LOW (ref 12.0–15.0)
LYMPHS ABS: 0.8 10*3/uL (ref 0.7–4.0)
Lymphocytes Relative: 9 % — ABNORMAL LOW (ref 12–46)
MCH: 30.7 pg (ref 26.0–34.0)
MCHC: 32.4 g/dL (ref 30.0–36.0)
MCV: 94.7 fL (ref 78.0–100.0)
Monocytes Absolute: 0.6 10*3/uL (ref 0.1–1.0)
Monocytes Relative: 7 % (ref 3–12)
NEUTROS PCT: 83 % — AB (ref 43–77)
Neutro Abs: 7.5 10*3/uL (ref 1.7–7.7)
PLATELETS: 139 10*3/uL — AB (ref 150–400)
RBC: 3.58 MIL/uL — AB (ref 3.87–5.11)
RDW: 18 % — AB (ref 11.5–15.5)
WBC: 9 10*3/uL (ref 4.0–10.5)

## 2013-08-01 LAB — URINALYSIS, ROUTINE W REFLEX MICROSCOPIC
Glucose, UA: NEGATIVE mg/dL
HGB URINE DIPSTICK: NEGATIVE
Ketones, ur: >80 mg/dL — AB
Leukocytes, UA: NEGATIVE
Nitrite: NEGATIVE
PH: 5 (ref 5.0–8.0)
Protein, ur: 100 mg/dL — AB
SPECIFIC GRAVITY, URINE: 1.027 (ref 1.005–1.030)
Urobilinogen, UA: 1 mg/dL (ref 0.0–1.0)

## 2013-08-01 LAB — COMPREHENSIVE METABOLIC PANEL
ALBUMIN: 3.3 g/dL — AB (ref 3.5–5.2)
ALK PHOS: 165 U/L — AB (ref 39–117)
ALT: 42 U/L — AB (ref 0–35)
AST: 66 U/L — AB (ref 0–37)
BUN: 10 mg/dL (ref 6–23)
CO2: 15 mEq/L — ABNORMAL LOW (ref 19–32)
Calcium: 8.7 mg/dL (ref 8.4–10.5)
Chloride: 100 mEq/L (ref 96–112)
Creatinine, Ser: 0.66 mg/dL (ref 0.50–1.10)
GFR calc Af Amer: >90 mL/min (ref >90)
GFR calc non Af Amer: >90 mL/min (ref >90)
Glucose, Bld: 89 mg/dL (ref 70–99)
POTASSIUM: 4.3 meq/L (ref 3.7–5.3)
SODIUM: 135 meq/L — AB (ref 137–147)
TOTAL PROTEIN: 7.5 g/dL (ref 6.0–8.3)
Total Bilirubin: 0.7 mg/dL (ref 0.3–1.2)

## 2013-08-01 LAB — LIPID PANEL
CHOLESTEROL: 187 mg/dL (ref 0–200)
HDL: 76 mg/dL (ref >39)
LDL Cholesterol: 85 mg/dL (ref 0–99)
TRIGLYCERIDES: 131 mg/dL (ref <150)
Total CHOL/HDL Ratio: 2.5 RATIO
VLDL: 26 mg/dL (ref 0–40)

## 2013-08-01 LAB — URINE MICROSCOPIC-ADD ON

## 2013-08-01 LAB — ETHANOL: Alcohol, Ethyl (B): <11 mg/dL (ref 0–11)

## 2013-08-01 LAB — GLUCOSE, CAPILLARY: GLUCOSE-CAPILLARY: 102 mg/dL — AB (ref 70–99)

## 2013-08-01 LAB — LIPASE, BLOOD: Lipase: 629 U/L — ABNORMAL HIGH (ref 11–59)

## 2013-08-01 LAB — LACTIC ACID, PLASMA: Lactic Acid, Venous: 1.2 mmol/L (ref 0.5–2.2)

## 2013-08-01 LAB — PREGNANCY, URINE: PREG TEST UR: NEGATIVE

## 2013-08-01 LAB — TROPONIN I

## 2013-08-01 MED ORDER — SODIUM CHLORIDE 0.9 % IV BOLUS (SEPSIS)
1000.0000 mL | Freq: Once | INTRAVENOUS | Status: AC
  Administered 2013-08-01: 1000 mL via INTRAVENOUS

## 2013-08-01 MED ORDER — SODIUM CHLORIDE 0.9 % IV SOLN: Freq: Once | INTRAVENOUS | Status: DC

## 2013-08-01 MED ORDER — SODIUM CHLORIDE 0.9 % IV SOLN: INTRAVENOUS | Status: DC

## 2013-08-01 MED ORDER — SODIUM CHLORIDE 0.9 % IV SOLN
INTRAVENOUS | Status: DC
  Administered 2013-08-01: 13:00:00 via INTRAVENOUS

## 2013-08-01 MED ORDER — SODIUM CHLORIDE 0.9 % IV SOLN
INTRAVENOUS | Status: DC
  Administered 2013-08-01: 16:00:00 via INTRAVENOUS

## 2013-08-01 MED ORDER — HYDROMORPHONE HCL PF 1 MG/ML IJ SOLN
1.0000 mg | Freq: Once | INTRAMUSCULAR | Status: AC
Start: 2013-08-01 — End: 2013-08-01
  Administered 2013-08-01: 1 mg via INTRAVENOUS
  Filled 2013-08-01: qty 1

## 2013-08-01 MED ORDER — HEPARIN BOLUS VIA INFUSION
1500.0000 [IU] | Freq: Once | INTRAVENOUS | Status: AC
  Administered 2013-08-02: 1500 [IU] via INTRAVENOUS
  Filled 2013-08-01: qty 1500

## 2013-08-01 MED ORDER — HYDROMORPHONE HCL PF 1 MG/ML IJ SOLN
1.0000 mg | Freq: Once | INTRAMUSCULAR | Status: AC
  Administered 2013-08-01: 1 mg via INTRAVENOUS
  Filled 2013-08-01: qty 1

## 2013-08-01 MED ORDER — HEPARIN (PORCINE) IN NACL 100-0.45 UNIT/ML-% IJ SOLN
1350.0000 [IU]/h | INTRAMUSCULAR | Status: DC
  Administered 2013-08-02 (×2): 1250 [IU]/h via INTRAVENOUS
  Administered 2013-08-02: 1150 [IU]/h via INTRAVENOUS
  Administered 2013-08-03: 1250 [IU]/h via INTRAVENOUS
  Administered 2013-08-04: 1350 [IU]/h via INTRAVENOUS
  Filled 2013-08-01 (×4): qty 250

## 2013-08-01 MED ORDER — HYDROMORPHONE HCL PF 1 MG/ML IJ SOLN
1.0000 mg | INTRAMUSCULAR | Status: DC | PRN
  Administered 2013-08-01 – 2013-08-03 (×10): 1 mg via INTRAVENOUS
  Filled 2013-08-01 (×11): qty 1

## 2013-08-01 MED ORDER — IOHEXOL 300 MG/ML  SOLN
20.0000 mL | INTRAMUSCULAR | Status: AC
  Administered 2013-08-01 (×2): 20 mL via ORAL

## 2013-08-01 MED ORDER — ONDANSETRON HCL 4 MG/2ML IJ SOLN
4.0000 mg | Freq: Four times a day (QID) | INTRAMUSCULAR | Status: DC | PRN
  Administered 2013-08-01 – 2013-08-03 (×5): 4 mg via INTRAVENOUS
  Filled 2013-08-01 (×5): qty 2

## 2013-08-01 MED ORDER — ONDANSETRON HCL 4 MG/2ML IJ SOLN
4.0000 mg | Freq: Once | INTRAMUSCULAR | Status: AC
  Administered 2013-08-01: 4 mg via INTRAVENOUS
  Filled 2013-08-01: qty 2

## 2013-08-01 MED ORDER — DEXTROSE-NACL 5-0.9 % IV SOLN
INTRAVENOUS | Status: DC
  Administered 2013-08-01: 19:00:00 via INTRAVENOUS
  Administered 2013-08-02: 1000 mL via INTRAVENOUS
  Administered 2013-08-02: 01:00:00 via INTRAVENOUS

## 2013-08-01 MED ORDER — PANTOPRAZOLE SODIUM 40 MG IV SOLR
40.0000 mg | INTRAVENOUS | Status: DC
  Administered 2013-08-02: 40 mg via INTRAVENOUS
  Filled 2013-08-01 (×2): qty 40

## 2013-08-01 MED ORDER — SODIUM CHLORIDE 0.9 % IJ SOLN
3.0000 mL | Freq: Two times a day (BID) | INTRAMUSCULAR | Status: DC
  Administered 2013-08-02: 3 mL via INTRAVENOUS
  Administered 2013-08-02: 21:00:00 via INTRAVENOUS
  Administered 2013-08-02 – 2013-08-05 (×2): 3 mL via INTRAVENOUS

## 2013-08-01 MED ORDER — ONDANSETRON HCL 4 MG PO TABS: 4.0000 mg | ORAL_TABLET | Freq: Four times a day (QID) | ORAL | Status: DC | PRN

## 2013-08-01 MED ORDER — MORPHINE SULFATE 2 MG/ML IJ SOLN
1.0000 mg | INTRAMUSCULAR | Status: DC | PRN
  Administered 2013-08-01 (×2): 1 mg via INTRAVENOUS
  Filled 2013-08-01 (×2): qty 1

## 2013-08-01 MED ORDER — ENOXAPARIN SODIUM 40 MG/0.4ML ~~LOC~~ SOLN
40.0000 mg | SUBCUTANEOUS | Status: DC
  Administered 2013-08-01: 40 mg via SUBCUTANEOUS
  Filled 2013-08-01 (×2): qty 0.4

## 2013-08-01 MED ORDER — IOHEXOL 300 MG/ML  SOLN
100.0000 mL | Freq: Once | INTRAMUSCULAR | Status: AC | PRN
  Administered 2013-08-01: 100 mL via INTRAVENOUS

## 2013-08-01 NOTE — H&P (Signed)
Date: 08/01/2013               Patient Name:  Lindsey [name] MRN: [id number]  DOB: 20-Jul-1970 Age / Sex: 43 y.o., female   PCP: Gaylord Shih, MD              Medical Service: Internal Medicine Teaching Service              Attending Physician: Dr. Aletta Edouard, MD    First Contact:  MS Arnetha Courser Pager: [phone]  Second Contact: Dr.Gill Pager: [phone]  Third Contact Dr. Shirlee Latch Pager: [phone]       After Hours (After 5p/  First Contact Pager: [phone]  weekends / holidays): Second Contact Pager: [phone]   Chief Complaint: Abdominal pain and backache with Nausea and Vomiting.  History of Present Illness:Pt. Is a 43 YO F C/O abdominal pain , radiating to back , ass. With N/V, anorexia started on Thursday, progressively getting worse. Pt. States pain is spasmatic, continuous, at 8/10 in intensity, aggravated by lying down and no relieving factor. C/O SOB not related to exertion, dizziness and chills. She states that she was vomiting clear fluid till last night, since then she had 2-3 episodes of hematemesis, once bright red, rest of time it was mixed with secretions. Denies any chest pain, palpitation, headache, change in vision, fever, steatorrhea, change in bowel habits . No urinary complaints.No H/O prior biliary colic, her prior workup for gall stone done 2 yrs. Ago was  Negative.H/O cocaine abuse in past but denies any recent abuse. H/O pancreatitis 2 yrs. Ago but states that this pain is different then that. H/O 1-2 beers on weekends, denies any bing drinking.Denies any PMH of DM. LMP: 07/27/13  Regular, denies any abnormal vaginal discharge. Meds: Current Facility-Administered Medications  Medication Dose Route Frequency Provider Last Rate Last Dose  . dextrose 5 %-0.9 % sodium chloride infusion   Intravenous Continuous Annett Gula, MD 150 mL/hr at 08/01/13 1914    . enoxaparin (LOVENOX) injection 40 mg  40 mg Subcutaneous Q24H Annett Gula, MD      . HYDROmorphone  (DILAUDID) injection 1 mg  1 mg Intravenous Q4H PRN Annett Gula, MD   1 mg at 08/01/13 1914  . ondansetron (ZOFRAN) tablet 4 mg  4 mg Oral Q6H PRN Annett Gula, MD       Or  . ondansetron [facility]) injection 4 mg  4 mg Intravenous Q6H PRN Annett Gula, MD   4 mg at 08/01/13 1914  . pantoprazole (PROTONIX) injection 40 mg  40 mg Intravenous Q24H Annett Gula, MD      . sodium chloride 0.9 % injection 3 mL  3 mL Intravenous Q12H Annett Gula, MD        Allergies: Allergies as of 08/01/2013  . (No Known Allergies)   Past Medical History  Diagnosis Date  . Stroke     assoc with short term memory loss and right peripheral vision loss; age 24   . Blood transfusion   . Anxiety   . Coronary artery disease     Apical LAD infarction '00; NSTEMI s/p BMS to prox LAD '09; Cath 12/2011 single vessel CAD w/ patent LAD stent w/ stable mild ISR, nl LV systolic fxn  . Anemia   . Antithrombin III deficiency     ?pt not sure if true diagnosis  . Tobacco abuse   . High blood pressure   . Myocardial infarction   .  TIA (transient ischemic attack)    Past Surgical History  Procedure Laterality Date  . Tubal ligation    . Cardiac catheterization    . Esophagogastroduodenoscopy N/A 06/09/2012    Procedure: ESOPHAGOGASTRODUODENOSCOPY (EGD);  Surgeon: Theda Belfast, MD;  Location: [facility];  Service: Endoscopy;  Laterality: N/A;  . Coronary angioplasty     Family History  Problem Relation Age of Onset  . Heart disease Brother     arrhythmia; died  . Breast cancer Maternal Aunt    History   Social History  . Marital Status: Divorced    Spouse Name: N/A    Number of Children: N/A  . Years of Education: N/A   Occupational History  . Not on file.   Social History Main Topics  . Smoking status: Current Some Day Smoker -- 0.20 packs/day for 1 years    Types: Cigarettes  . Smokeless tobacco: Never Used  . Alcohol Use: 0.0 oz/week    0 Cans of beer per week     Comment: occasion    . Drug Use: No  . Sexual Activity: Yes    Birth Control/ Protection: None   Other Topics Concern  . Not on file   Social History Narrative  . No narrative on file    Review of Systems: CONSTITUTIONAL:Positive for chills. RESPIRATORY:C/O SOB . CARDIAC:No chestpain, palpitation. Sweating. GI:C/O anorexia, nausea, vomiting, hematemesis, and abdominal pain mostly at LUQ. Denies any change in bowel habit GENITOURINARY:Denies any dysuria, urinary frequency or hematuria. MUSCULOSKELETAL:Backache , mostly around paraspinal region.  Physical Exam: Blood pressure 164/99, pulse 80, temperature 97.4 F (36.3 C), temperature source Oral, resp. rate 16, height 5\' 6"  (1.676 m), weight 71.668 kg (158 lb), SpO2 99.00%. BP 164/99  Pulse 80  Temp(Src) 97.4 F (36.3 C) (Oral)  Resp 16  Ht 5\' 6"  (1.676 m)  Wt 71.668 kg (158 lb)  BMI 25.51 kg/m2  SpO2 99%  General Appearance:    Alert, cooperative, no distress, appears stated age  Head:    Normocephalic, without obvious abnormality, atraumatic  Eyes:    PERRL, conjunctiva/corneas clear, EOM's intact,       Throat:   Lips, mucosa, and tongue normal; teeth and gums normal  Neck:   Supple, symmetrical, trachea midline, no adenopathy;    thyroid:  no enlargement/tenderness/nodules; no carotid   bruit or JVD  Back:     Symmetric, no curvature, ROM normal, paraspinal tenderness on left.  Lungs:     Clear to auscultation bilaterally, respirations unlabored  Chest Wall:    No tenderness or deformity   Heart:    Regular rate and rhythm, S1 and S2 normal, no murmur, rub   or gallop     Abdomen:     Soft, mildly tender at middle and LLQ all the way to the back, no rebound, bowel sounds active all four quadrants,    no masses, no organomegaly     Extremities:   Extremities normal, atraumatic, no cyanosis or edema  Pulses:   2+ and symmetric all extremities  Skin:   Skin color, texture, turgor normal, no rashes or lesions     Neurologic:    CNII-XII intact, normal strength, sensation and reflexes    throughout    Lab results:  Labs Review  Labs Reviewed   CBC WITH DIFFERENTIAL - Abnormal; Notable for the following:    RBC  3.58 (*)     Hemoglobin  11.0 (*)     HCT  33.9 (*)  RDW  18.0 (*)     Platelets  139 (*)     Neutrophils Relative %  83 (*)     Lymphocytes Relative  9 (*)     All other components within normal limits   COMPREHENSIVE METABOLIC PANEL - Abnormal; Notable for the following:    Sodium  135 (*)     CO2  15 (*)     Albumin  3.3 (*)     AST  66 (*)     ALT  42 (*)     Alkaline Phosphatase  165 (*)     All other components within normal limits   LIPASE, BLOOD - Abnormal; Notable for the following:    Lipase  629 (*)     All other components within normal limits   URINALYSIS, ROUTINE W REFLEX MICROSCOPIC - Abnormal; Notable for the following:    Color, Urine  AMBER (*)     APPearance  CLOUDY (*)     Bilirubin Urine  MODERATE (*)     Ketones, ur  >80 (*)     Protein, ur  100 (*)     All other components within normal limits   URINE MICROSCOPIC-ADD ON - Abnormal; Notable for the following:    Bacteria, UA  FEW (*)     Casts  HYALINE CASTS (*)     All other components within normal limits   PREGNANCY, URINE    Imaging Review  No results found.  EKG Interpretation  Date/Time: Monday Aug 01 2013 08:25:04 EDT  Ventricular Rate: 91  PR Interval: 130  QRS Duration: 73  QT Interval: 368  QTC Calculation: 453  R Axis: 39  Text Interpretation: Sinus rhythm Baseline wander in lead(s) III aVL No  significant change since last tracing Confirmed by Denton LankSTEINL MD, Caryn BeeKEVIN  ([id number]) on 08/01/2013 8:42:29 AM    Imaging results:  No results found.  Other results: EKG: normal EKG, normal sinus rhythm, unchanged from previous tracings.  Assessment & Plan by Problem: 1: ABDOMINAL PAIN: Can be either due to pancreatitis as her lipase is little high or can be due to some GU cause like ovarian cyst as pain  was mostly at left lower abdomen, or some mesenteric ischemia cos, of her antithrombin III deficiency, renal caliculi can be a possibility but she denies any dysuria, hematuria. Her pain is vague and her facial expression don't match with her subjective C/O pain. Need to get CT abdomin to confirm any pathology. Keep her NPO, Dilaudid 1mg  4QH PRN,ZOFRAN injection 4mg  prn, protonix injection 40 mg QD. Dextrose 5%- 0.9 sodium chloride 15750ml/hr.  2: HYPERTENSION: Her BP seems ok, hold her home meds.of carvedilol 6.25mg  BID. As she is NPO and keep monitoring. 3: HYPERLIPIDEMIA-MIXED: Continue with her home med. Of simvastatin 20mg  once she is off from NPO. 4: CAD: No acute complaint, EKG does no show any recent change. Get Troponin stat. And continue with home meds. Of Aspirin 325 mg QD, Plavix 75 mg QD, and nitroglycerin SL PRN. After NPO.   This is a Psychologist, occupationalMedical Student Note.  The care of the patient was discussed with Dr. Shirlee LatchMcLean and the assessment and plan was formulated with their assistance.  Please see their note for official documentation of the patient encounter.   Signed: Arnetha CourserSumayya Brazil Voytko, Med Student 08/01/2013, 7:45 PM

## 2013-08-01 NOTE — Progress Notes (Signed)
AHMANI POEPPELMAN [id number] Code Status: FULL Admission Data: 08/01/2013 4:01 PM Attending Provider:  Dalphine Handing DQQ:IWLNLG Daleen Squibb, MD Consults/ Treatment Team:  IM Teaching Service  DEMETRESS AMBERT is a 43 y.o. female patient admitted from ED awake, alert - oriented  X 3 - no acute distress noted.  VSS - Blood pressure 110/73, pulse 76, temperature 97.4 F (36.3 C), temperature source Oral, resp. rate 13, height 5\' 6"  (1.676 m), weight 71.668 kg (158 lb), SpO2 100.00%.    IV in place, occlusive dsg intact without redness.  Orientation to room, and floor completed with information packet given to patient/family.  Patient declined safety video at this time.  Admission INP armband ID verified with patient/family, and in place.   SR up x 2, fall assessment complete, with patient and family able to verbalize understanding of risk associated with falls, and verbalized understanding to call nsg before up out of bed.  Call light within reach, patient able to voice, and demonstrate understanding.  Skin, clean-dry- intact without evidence of bruising, or skin tears.   No evidence of skin break down noted on exam.     Will cont to eval and treat per MD orders.  Aldean Ast, RN 08/01/2013 4:01 PM

## 2013-08-01 NOTE — Progress Notes (Signed)
ANTICOAGULATION CONSULT NOTE - Initial Consult  Pharmacy Consult for Heparin Indication: apical thrombus   No Known Allergies  Patient Measurements: Height: 5\' 6"  (167.6 cm) Weight: 158 lb (71.668 kg) IBW/kg (Calculated) : 59.3  Vital Signs: Temp: 97.4 F (36.3 C) (05/18 1847) Temp src: Oral (05/18 1847) BP: 164/99 mmHg (05/18 1847) Pulse Rate: 80 (05/18 1847)  Labs:  Recent Labs  08/01/13 0937 08/01/13 1850  HGB 11.0*  --   HCT 33.9*  --   PLT 139*  --   CREATININE 0.66  --   TROPONINI  --  <0.30    Estimated Creatinine Clearance: 91.1 ml/min (by C-G formula based on Cr of 0.66).   Medical History: Past Medical History  Diagnosis Date  . Stroke     assoc with short term memory loss and right peripheral vision loss; age 52   . Blood transfusion   . Anxiety   . Coronary artery disease     Apical LAD infarction '00; NSTEMI s/p BMS to prox LAD '09; Cath 12/2011 single vessel CAD w/ patent LAD stent w/ stable mild ISR, nl LV systolic fxn  . Anemia   . Antithrombin III deficiency     ?pt not sure if true diagnosis  . Tobacco abuse   . High blood pressure   . Myocardial infarction   . TIA (transient ischemic attack)     Medications:  Prescriptions prior to admission  Medication Sig Dispense Refill  . aspirin 325 MG tablet Take 325 mg by mouth every morning.      . Aspirin-Salicylamide-Caffeine (BC HEADACHE POWDER PO) Take 1 packet by mouth every 6 (six) hours as needed (pain).      . carvedilol (COREG) 6.25 MG tablet Take 1 tablet (6.25 mg total) by mouth 2 (two) times daily with a meal.  60 tablet  6  . clopidogrel (PLAVIX) 75 MG tablet Take 1 tablet (75 mg total) by mouth daily.  30 tablet  6  . nitroGLYCERIN (NITROSTAT) 0.4 MG SL tablet Place 1 tablet (0.4 mg total) under the tongue every 5 (five) minutes as needed for chest pain (up to 3 doses).  25 tablet  3  . ondansetron (ZOFRAN-ODT) 8 MG disintegrating tablet Take 8 mg by mouth every 4 (four) hours as  needed for nausea. 8mg  ODT q4 hours prn nausea      . simvastatin (ZOCOR) 20 MG tablet Take 20 mg by mouth at bedtime.        Assessment: 43 yo female with possible apical thrombus seen on CT, for heparin.  Received Lovenox 40 mg SQ at 8 pm  Goal of Therapy:  Heparin level 0.3-0.7 units/ml Monitor platelets by anticoagulation protocol: Yes   Plan:  Heparin 1500 units IV bolus, then 1150 units/hr Check heparin level in 8 hours.    Gary Fleet Mashanda Ishibashi 08/01/2013,11:46 PM

## 2013-08-01 NOTE — Progress Notes (Signed)
CT abdomen pelvis results w/ findings highly suspicious for potential left ventricular apical thrombus (likely related to prior distal LAD territory myocardial infarction). This places the patient at high risk for systemic embolization.  Patient w/ previous h/o CAD, most recent cath in 2013 showed single vessel CAD with patent stent in the proximal LAD with stable mild in-stent re-stenosis. Also suggestive of segmental wall motion abnormality with preserved LV systolic function, stable over the 3 previous years. ALSO shows apical akinesis w/ apical aneurysm.  Patient w/ questionable Antithrombin III deficiency. -Discussed w/ cardiology, will start Heparin gtt at this time given findings discussed above in the setting of Antithrombin III deficiency. -Ordered ECHO in AM to assess further for apical thrombus  Signed: Courtney Paris, MD 08/01/2013 11:41 PM

## 2013-08-01 NOTE — ED Provider Notes (Signed)
CSN: [id number]     Arrival date & time 08/01/13  [id number] History   First MD Initiated Contact with Patient 08/01/13 [id number]     Chief Complaint  Patient presents with  . Abdominal Pain  . Back Pain     (Consider location/radiation/quality/duration/timing/severity/associated sxs/prior Treatment) Patient is a 43 y.o. female presenting with abdominal pain and back pain. The history is provided by the patient.  Abdominal Pain Pain location:  LUQ and L flank (laft back paraspinal muscles) Pain quality: aching   Pain radiates to:  Does not radiate Pain severity:  Moderate Onset quality:  Gradual Duration:  7 days Timing:  Intermittent Progression:  Waxing and waning Chronicity:  New Context: not alcohol use, not previous surgeries, not recent travel, not suspicious food intake and not trauma   Relieved by:  Nothing Worsened by:  Nothing tried Ineffective treatments:  None tried Associated symptoms: hematemesis (small volume .late in progression of illness), nausea and vomiting   Associated symptoms: no cough, no diarrhea and no dysuria   Risk factors: NSAID use   Risk factors: no alcohol abuse and not pregnant   Back Pain Associated symptoms: abdominal pain   Associated symptoms: no dysuria     Past Medical History  Diagnosis Date  . Stroke   . Blood transfusion   . Anxiety   . Coronary artery disease     Apical LAD infarction '00; NSTEMI s/p BMS to prox LAD '09; Cath 12/2011 single vessel CAD w/ patent LAD stent w/ stable mild ISR, nl LV systolic fxn  . Anemia   . Antithrombin III deficiency     ?pt not sure if true diagnosis  . Tobacco abuse   . High blood pressure    Past Surgical History  Procedure Laterality Date  . Tubal ligation    . Cardiac catheterization    . Esophagogastroduodenoscopy N/A 06/09/2012    Procedure: ESOPHAGOGASTRODUODENOSCOPY (EGD);  Surgeon: Theda BelfastPatrick D Hung, MD;  [facility];  Service: Endoscopy;  Laterality: N/A;   Family History    Problem Relation Age of Onset  . Heart disease Brother     arrhythmia  . Breast cancer Maternal Aunt    History  Substance Use Topics  . Smoking status: Current Some Day Smoker -- 0.20 packs/day for 1 years    Types: Cigarettes  . Smokeless tobacco: Not on file  . Alcohol Use: 0.0 oz/week    0 Cans of beer per week     Comment: occasion   OB History   Grav Para Term Preterm Abortions TAB SAB Ect Mult Living                 Review of Systems  Respiratory: Negative for cough.   Gastrointestinal: Positive for nausea, vomiting, abdominal pain and hematemesis (small volume .late in progression of illness). Negative for diarrhea.  Genitourinary: Negative for dysuria.  Musculoskeletal: Positive for back pain.  All other systems reviewed and are negative.     Allergies  Review of patient's allergies indicates no known allergies.  Home Medications   Prior to Admission medications   Medication Sig Start Date End Date Taking? Authorizing Provider  aspirin 325 MG tablet Take 325 mg by mouth every morning.   Yes Historical Provider, MD  Aspirin-Salicylamide-Caffeine (BC HEADACHE POWDER PO) Take 1 packet by mouth every 6 (six) hours as needed (pain).   Yes Historical Provider, MD  carvedilol (COREG) 6.25 MG tablet Take 1 tablet (6.25 mg total) by mouth 2 (two) times  daily with a meal. 12/24/11  Yes Jessica A Hope, PA-C  clopidogrel (PLAVIX) 75 MG tablet Take 1 tablet (75 mg total) by mouth daily. 06/09/12  Yes Leroy Sea, MD  nitroGLYCERIN (NITROSTAT) 0.4 MG SL tablet Place 1 tablet (0.4 mg total) under the tongue every 5 (five) minutes as needed for chest pain (up to 3 doses). 12/24/11  Yes Jessica A Hope, PA-C  ondansetron (ZOFRAN-ODT) 8 MG disintegrating tablet Take 8 mg by mouth every 4 (four) hours as needed for nausea. 8mg  ODT q4 hours prn nausea 08/31/12  Yes Hurman Horn, MD   BP 137/104  Pulse 94  Temp(Src) 98.6 F (37 C) (Oral)  Resp 20  Ht 5\' 6"  (1.676 m)  Wt 150 lb  (68.04 kg)  BMI 24.22 kg/m2  SpO2 100% Physical Exam  Constitutional: She is oriented to person, place, and time. She appears well-developed and well-nourished. No distress.  HENT:  Head: Normocephalic.  Eyes: Conjunctivae are normal.  Neck: Neck supple. No tracheal deviation present.  Cardiovascular: Normal rate, regular rhythm and normal heart sounds.   Pulmonary/Chest: Effort normal and breath sounds normal. No respiratory distress.  Abdominal: Soft. She exhibits no distension. There is tenderness (mild over left side). There is no rigidity, no rebound, no guarding, no CVA tenderness, no tenderness at McBurney's point and negative Murphy's sign.  Musculoskeletal:       Lumbar back: She exhibits tenderness (over paraspinal musculature). She exhibits no deformity and no spasm.       Back:  Neurological: She is alert and oriented to person, place, and time.  Skin: Skin is warm and dry.  Psychiatric: She has a normal mood and affect.    ED Course  Procedures (including critical care time) Labs Review Labs Reviewed  CBC WITH DIFFERENTIAL - Abnormal; Notable for the following:    RBC 3.58 (*)    Hemoglobin 11.0 (*)    HCT 33.9 (*)    RDW 18.0 (*)    Platelets 139 (*)    Neutrophils Relative % 83 (*)    Lymphocytes Relative 9 (*)    All other components within normal limits  COMPREHENSIVE METABOLIC PANEL - Abnormal; Notable for the following:    Sodium 135 (*)    CO2 15 (*)    Albumin 3.3 (*)    AST 66 (*)    ALT 42 (*)    Alkaline Phosphatase 165 (*)    All other components within normal limits  LIPASE, BLOOD - Abnormal; Notable for the following:    Lipase 629 (*)    All other components within normal limits  URINALYSIS, ROUTINE W REFLEX MICROSCOPIC - Abnormal; Notable for the following:    Color, Urine AMBER (*)    APPearance CLOUDY (*)    Bilirubin Urine MODERATE (*)    Ketones, ur >80 (*)    Protein, ur 100 (*)    All other components within normal limits  URINE  MICROSCOPIC-ADD ON - Abnormal; Notable for the following:    Bacteria, UA FEW (*)    Casts HYALINE CASTS (*)    All other components within normal limits  PREGNANCY, URINE    Imaging Review No results found.   EKG Interpretation   Date/Time:  Monday Aug 01 2013 08:25:04 EDT Ventricular Rate:  91 PR Interval:  130 QRS Duration: 73 QT Interval:  368 QTC Calculation: 453 R Axis:   39 Text Interpretation:  Sinus rhythm Baseline wander in lead(s) III aVL No  significant  change since last tracing Confirmed by [facility]  MD, Caryn Bee  ([id number]) on 08/01/2013 8:42:29 AM      MDM   Final diagnoses:  Pancreatitis, acute    43 y.o. female presents with left-sided abdominal pain that has worsened over the last 2 days. Has been intermittent in nature and has had progressively worsening vomiting. No fevers, no chest pain, does endorse some subjective shortness of breath but not related to exertion. Received Zofran in route with EMS and had relief of nausea symptoms but having persistent tenderness to the left side of her abdomen and lower back. No history of kidney stones prior. Lab workup with screening labs for acute renal pathology, pancreatitis, urinary infection or blood concerning for stone.EKG without ST or T wave changes concerning for myocardial ischemia. No delta wave, no prolonged QTc, no brugada to suggest arrhythmogenicity.   Urinalysis without signs of infection and no blood lowering likelihood of stone as etiology for pain. Lipase is slightly elevated to level of 629. It appears the patient has had a prior admission for acute pancreatitis that was attributed to alcohol consumption although she denies that today. No recent changes in medications, her prior gallbladder workup was negative for stones. She is not known to have high triglycerides or any other predisposing factor. The patient is having refractory pain after multiple IV doses of pain medication she'll be admitted for further  monitoring and workup of pancreatitis.  Lyndal Pulley, MD 08/01/13 1435

## 2013-08-01 NOTE — Progress Notes (Signed)
3:04 PM Report received from ED RN for admission to [phone]

## 2013-08-01 NOTE — H&P (Signed)
Date: 08/01/2013               Patient Name:  Lindsey [name] MRN: [id number]  DOB: [birth year] Age / Sex: 43 y.o., female   PCP: Renella Cunas, MD           Medical Service: Internal Medicine Teaching Service         Attending Physician: Dr. Madilyn Fireman, MD    First Contact: Dr. Michail Jewels, MD Pager: [phone] (7AM-5PM Mon-Fri)  Second Contact: Dr. Karlyn Agee, MD Pager: [phone]       After Hours (After 5p/  First Contact Pager: [phone]  weekends / holidays): Second Contact Pager: [phone]    Most Recent Discharge Date:  10/11/12  Chief Complaint:  Chief Complaint  Patient presents with  . Abdominal Pain  . Back Pain       History of Present Illness:  Lindsey [name] is a 43 y.o. female with a PMH CVA (possible ATIII deficiency), anxiety, CAD, HTN, MI.  Pt presents to the ED with N/V and worsening abdominal pain since Friday.  She began to feel bad on Friday with worsening LLQ abdominal pain radiating to her back with N/V (pt reports hematemisis).  She denies any fever/chills, URI symptoms, CP, SOB, or dysuria.  She reports she is on her menstrual period.  She denies eating any different foods lately and no one around her has similar symptoms.  She has been taking a lot of goody powder recently because that is the only thing that helps her with pain.  She reports having an EGD done which showed a hiatal hernia and a non-revealing colonoscopy.  She denies any URI symptoms or h/o kidney stones.  She has not had any gynecological issues.  She still rates her pain 8/10 that is unrelieved with morphine.  She denies regular alcohol use or recreational drug use.   Of note, she had a flare of pancreatitis previously and reports that her symptoms are not comparable to that episode.    ED course: In the ED, she was given dilaudid which helped her pain somewhat.  She definitely says that morphine does not help her pain.   Meds: Current Facility-Administered Medications  Medication  Dose Route Frequency Provider Last Rate Last Dose  . dextrose 5 %-0.9 % sodium chloride infusion   Intravenous Continuous Cresenciano Genre, MD 150 mL/hr at 08/01/13 1914    . enoxaparin (LOVENOX) injection 40 mg  40 mg Subcutaneous Q24H Cresenciano Genre, MD   40 mg at 08/01/13 2004  . HYDROmorphone (DILAUDID) injection 1 mg  1 mg Intravenous Q4H PRN Cresenciano Genre, MD   1 mg at 08/01/13 1914  . ondansetron (ZOFRAN) tablet 4 mg  4 mg Oral Q6H PRN Cresenciano Genre, MD       Or  . ondansetron [facility]) injection 4 mg  4 mg Intravenous Q6H PRN Cresenciano Genre, MD   4 mg at 08/01/13 1914  . pantoprazole (PROTONIX) injection 40 mg  40 mg Intravenous Q24H Cresenciano Genre, MD      . sodium chloride 0.9 % injection 3 mL  3 mL Intravenous Q12H Cresenciano Genre, MD        Prescriptions prior to admission  Medication Sig Dispense Refill  . aspirin 325 MG tablet Take 325 mg by mouth every morning.      . Aspirin-Salicylamide-Caffeine (BC HEADACHE POWDER PO) Take 1 packet by mouth every 6 (six) hours as needed (pain).      Marland Kitchen  carvedilol (COREG) 6.25 MG tablet Take 1 tablet (6.25 mg total) by mouth 2 (two) times daily with a meal.  60 tablet  6  . clopidogrel (PLAVIX) 75 MG tablet Take 1 tablet (75 mg total) by mouth daily.  30 tablet  6  . nitroGLYCERIN (NITROSTAT) 0.4 MG SL tablet Place 1 tablet (0.4 mg total) under the tongue every 5 (five) minutes as needed for chest pain (up to 3 doses).  25 tablet  3  . ondansetron (ZOFRAN-ODT) 8 MG disintegrating tablet Take 8 mg by mouth every 4 (four) hours as needed for nausea. 60m ODT q4 hours prn nausea      . simvastatin (ZOCOR) 20 MG tablet Take 20 mg by mouth at bedtime.        Allergies: Allergies as of 08/01/2013  . (No Known Allergies)    PMH: Past Medical History  Diagnosis Date  . Stroke     assoc with short term memory loss and right peripheral vision loss; age 89  . Blood transfusion   . Anxiety   . Coronary artery disease     Apical LAD infarction  '00; NSTEMI s/p BMS to prox LAD '09; Cath 12/2011 single vessel CAD w/ patent LAD stent w/ stable mild ISR, nl LV systolic fxn  . Anemia   . Antithrombin III deficiency     ?pt not sure if true diagnosis  . Tobacco abuse   . High blood pressure   . Myocardial infarction   . TIA (transient ischemic attack)     PSH: Past Surgical History  Procedure Laterality Date  . Tubal ligation    . Cardiac catheterization    . Esophagogastroduodenoscopy N/A 06/09/2012    Procedure: ESOPHAGOGASTRODUODENOSCOPY (EGD);  Surgeon: PBeryle Beams MD;  [facility];  Service: Endoscopy;  Laterality: N/A;  . Coronary angioplasty      FH: Family History  Problem Relation Age of Onset  . Heart disease Brother     arrhythmia; died  . Breast cancer Maternal Aunt     SH: History  Substance Use Topics  . Smoking status: Current Some Day Smoker -- 0.20 packs/day for 1 years    Types: Cigarettes  . Smokeless tobacco: Never Used  . Alcohol Use: 0.0 oz/week    0 Cans of beer per week     Comment: occasion    Review of Systems: Pertinent items are noted in HPI.  Physical Exam: Filed Vitals:   08/01/13 1847  BP: 164/99  Pulse: 80  Temp: 97.4 F (36.3 C)  Resp: 16    Physical Exam Constitutional: Vital signs reviewed.  Patient is a well-developed and well-nourished female in NAD and appears comfortable sitting up in bed.   Head: Normocephalic and atraumatic Eyes: PERRL, EOMI, conjunctivae normal, no scleral icterus.  Neck: Supple, Trachea midline .  Cardiovascular: RRR, no MRG, pulses symmetric and intact bilaterally Pulmonary/Chest: normal respiratory effort, CTAB, no wheezes, rales, or rhonchi Abdominal: Soft. tenderness in LLQ, non-distended, bowel sounds diminished, no masses, organomegaly, or guarding present.  Musculoskeletal: No joint deformities, erythema, or stiffness noted. Neurological: A&O x3, CN II-XII are grossly intact, no focal motor deficit appreciated.     Extremities: DP 2+ b/l; no c/c/e Skin: Warm, dry and intact. No rash. Psychiatric: Normal mood and affect.   Lab results:  Basic Metabolic Panel:  Recent Labs  08/01/13 0937  NA 135*  K 4.3  CL 100  CO2 15*  GLUCOSE 89  BUN 10  CREATININE 0.66  CALCIUM 8.7   Anion Gap: 20   Calcium/Magnesium/Phosphorus:  Recent Labs Lab 08/01/13 0937  CALCIUM 8.7    Liver Function Tests:  Recent Labs  08/01/13 0937  AST 66*  ALT 42*  ALKPHOS 165*  BILITOT 0.7  PROT 7.5  ALBUMIN 3.3*    CBC: Lab Results  Component Value Date   WBC 9.0 08/01/2013   HGB 11.0* 08/01/2013   HCT 33.9* 08/01/2013   MCV 94.7 08/01/2013   PLT 139* 08/01/2013    Lipase: Lab Results  Component Value Date   LIPASE 629* 08/01/2013    Lactic Acid/Procalcitonin:  Recent Labs Lab 08/01/13 2030  LATICACIDVEN 1.2    Cardiac Enzymes: No results found for this basename: TROPIPOC,  in the last 72 hours Lab Results  Component Value Date   CKTOTAL 67 06/29/2011   CKMB 1.5 06/29/2011   TROPONINI <0.30 08/01/2013    BNP: No results found for this basename: PROBNP,  in the last 72 hours  D-Dimer: No results found for this basename: DDIMER,  in the last 72 hours  CBG:  Recent Labs  08/01/13 2146  GLUCAP 102*    Hemoglobin A1C: No results found for this basename: HGBA1C,  in the last 72 hours  Lipid Panel:  Recent Labs  08/01/13 1605  CHOL 187  HDL 76  LDLCALC 85  TRIG 131  CHOLHDL 2.5    Thyroid Function Tests: No results found for this basename: TSH, T4TOTAL, FREET4, T3FREE, THYROIDAB,  in the last 72 hours  Anemia Panel: No results found for this basename: VITAMINB12, FOLATE, FERRITIN, TIBC, IRON, RETICCTPCT,  in the last 72 hours  Coagulation: No results found for this basename: LABPROT, INR,  in the last 72 hours  Urine Drug Screen: Drugs of Abuse:     Component Value Date/Time   LABOPIA NONE DETECTED 12/23/2010 [place] 12/23/2010 2328    LABBENZ NONE DETECTED 12/23/2010 2328   AMPHETMU NONE DETECTED 12/23/2010 2328   THCU NONE DETECTED 12/23/2010 2328   LABBARB NONE DETECTED 12/23/2010 2328    Alcohol Level:  Recent Labs  08/01/13 1605  ETH <11    Urinalysis:    Component Value Date/Time   COLORURINE AMBER* 08/01/2013 0924   APPEARANCEUR CLOUDY* 08/01/2013 0924   LABSPEC 1.027 08/01/2013 [place] 5.0 08/01/2013 [place] 08/01/2013 0924   [place] 08/01/2013 [place]* 08/01/2013 0924   KETONESUR >80* 08/01/2013 0924   PROTEINUR 100* 08/01/2013 0924   UROBILINOGEN 1.0 08/01/2013 0924   NITRITE NEGATIVE 08/01/2013 0924   LEUKOCYTESUR NEGATIVE 08/01/2013 0924    Imaging results:  Ct Abdomen Pelvis W Contrast  08/01/2013   CLINICAL DATA:  Mid abdominal pain. Nausea and vomiting. Possible pancreatitis.  EXAM: CT ABDOMEN AND PELVIS WITH CONTRAST  TECHNIQUE: Multidetector CT imaging of the abdomen and pelvis was performed using the standard protocol following bolus administration of intravenous contrast.  CONTRAST:  126m OMNIPAQUE IOHEXOL 300 MG/ML  SOLN  COMPARISON:  CT of the abdomen and pelvis 06/10/2011.  FINDINGS: Lung Bases: There is a small filling defect within the apex of the left ventricle, best appreciated on images 6-8 of series 2, suspicious for a left ventricular apical thrombus. There is some myocardial thinning and low-attenuation throughout the left ventricular apex, which appears bowed slightly outward, suggesting fibrofatty remodeling related to prior left anterior descending territory myocardial infarction.  Abdomen/Pelvis: Pancreatic parenchyma enhances normally at this time. No focal pancreatic lesions.  No pancreatic ductal dilatation. However, there is extensive peripancreatic stranding throughout the retroperitoneal fat, indicative of acute inflammation. No peripancreatic fluid collections are noted at this time to suggest pseudocyst formation. The splenic vein,  superior mesenteric vein, splenoportal confluence and portal veins are all patent at this time.  Diffuse low attenuation throughout the hepatic parenchyma, suggestive of hepatic steatosis (difficult to say for certain on today's contrast-enhanced CT scan). No focal hepatic lesions are noted. The appearance of the gallbladder, spleen, bilateral adrenal glands and the right kidney is unremarkable. Sub cm low-attenuation lesion in the lower pole of the left kidney is too small to definitively characterize, but is statistically likely to represent a tiny cyst. No significant volume of ascites. No pneumoperitoneum. No pathologic distention of small bowel. No definite lymphadenopathy identified within the abdomen or pelvis. Normal appendix. Numerous colonic diverticulae are noted, particularly in the descending and sigmoid colon, without surrounding inflammatory changes to suggest an acute diverticulitis at this time. Uterus and bilateral ovaries are unremarkable in appearance. Urinary bladder is normal in appearance.  Musculoskeletal: There are no aggressive appearing lytic or blastic lesions noted in the visualized portions of the skeleton.  IMPRESSION: 1. Imaging findings are compatible with acute pancreatitis. No pancreatic pseudocyst or evidence of frank pancreatic necrosis is noted at this time. 2. Findings are highly suspicious for potential left ventricular apical thrombus (likely related to prior distal LAD territory myocardial infarction). This places the patient at high risk for systemic embolization, and consultation with Cardiology and further evaluation with echocardiography is strongly recommended in the near future. 3. Diffuse low attenuation throughout the hepatic parenchyma, suggestive of hepatic steatosis. This difficult to confirm on today's contrast enhanced CT scan. 4. Colonic diverticulosis without findings to suggest acute diverticulitis at this time. 5. Additional incidental findings, as above.  These results were called by telephone at the time of interpretation on 08/01/2013 at 9:56 PM to nurse Clarene Critchley for Dr. Madilyn Fireman, who verbally acknowledged these results.   Electronically Signed   By: Vinnie Langton M.D.   On: 08/01/2013 22:00    EKG: EKG Interpretation  Date/Time:  Monday Aug 01 2013 08:25:04 EDT Ventricular Rate:  91 PR Interval:  130 QRS Duration: 73 QT Interval:  368 QTC Calculation: 453 R Axis:   39 Text Interpretation:  Sinus rhythm Baseline wander in lead(s) III aVL No significant change since last tracing Confirmed by [facility]  MD, Lennette Bihari ([id number]) on 08/01/2013 8:42:29 AM  Antibiotics: Antibiotics Given (last 72 hours)   None      Anti-infectives   None      SIRS/Sepsis/Septic Shock criteria met:  No  Consults:    Assessment & Plan by Problem: Principal Problem:   Pancreatitis, acute Active Problems:   HYPERLIPIDEMIA-MIXED   HYPERTENSION   Pancreatitis  Abdominal pain with metabolic acidosis Pt presents with LLQ abdominal pain with N/V.  Pain likely related to acute pancreatitis but other etiologies should also be considered.  Lipase >3xULN.  Additional possibilities include: diverticulitis, nephrolithiasis, gynecology pathology.  Pregnancy test negative in ED.  Acidosis likely due to ketoacidosis since ketonuria (pt reports decreased po intake).  Lactic acid wnl.   -CT Abd/pelvis  -troponin x1 -NPO -D5 NS 189m/h -dilaudid 151mq4h -Lipid panel  Hematemesis Likely etiology mallory-weiss tear due to repeated vomiting.  Hgb stable.  EGD 06/09/12: 2cm hiatal hernia.   -PPI  h/o CAD s/p anterior STEMI in 2000 with PCI of LAD; s/p PTCA and bare-metal stent to the LAD in 2009.  s/p cath on May 03, 2009, showing no obstructive disease with LVEF of 55%. -continue ASA, plavix  Hypertension  Stable.   -hold home meds while NPO  Elevated transaminases  Most likely NAFLD as AST:ALT>2:1 other possibilities include ischemic hepatitis,  alcoholic hepatitis, cirrhosis; additionally could also be viral hepatitis but would expect ALT>AST.  -trend LFTs  -LDH  -monitor  h/o CVA/TIA -troponin x 1 -continue ASA 322m po and plavix 759m-continue simvastatin 2049mhs  Dyslipidemia  Pt is on simvastatin 67m69m home.   Lab Results  Component Value Date   LDLCALC 85 08/01/2013  -continue statin therapy (pt should be on high intensity statin given CVA and cardiac history).   FEN  Fluids-D5 1/2 NS Electrolytes- Nutrition- NPO   VTE prophylaxis  lovenox 40mg96mqd -monitor CBC  Disposition Disposition deferred at this time, awaiting improvement of current medical problems. Anticipated discharge in approximately 1-2 day(s).   -consult care management -consult social work   [facility] [place] [facility] [phone]   The patient does have a current PCP (ThomRenella Cunas and [facility].  Signed JacquJones BalesPGY-1, Internal Medicine Teaching Service [phone] Mon-Fri) 08/01/2013, 10:42 PM

## 2013-08-01 NOTE — ED Notes (Signed)
Pt here for abd pain and back pain, onset several days ago but getting worse. Pt has some nausea and vomiting but denies cp and sob, pt given zofran with ems with relief of nausea,

## 2013-08-01 NOTE — Progress Notes (Signed)
Date: 08/01/2013               Patient Name:  Lindsey [name] MRN: [id number]  DOB: [birth year] Age / Sex: 43 y.o., female   PCP: Gaylord Shih, MD              Medical Service: Internal Medicine Teaching Service              Attending Physician: Dr. Aletta Edouard, MD    First Contact:  MS Arnetha Courser Pager: [phone]  Second Contact: Dr.Gill Pager: [phone]  Third Contact Dr. Shirlee Latch Pager: [phone]       After Hours (After 5p/  First Contact Pager: [phone]  weekends / holidays): Second Contact Pager: [phone]   Chief Complaint: Abdominal pain and backache with Nausea and Vomiting.  History of Present Illness:Pt. Is a 43 YO F C/O abdominal pain , radiating to back , ass. With N/V, anorexia started on Thursday, progressively getting worse. Pt. States pain is spasmatic, continuous, at 8/10 in intensity, aggravated by lying down and no relieving factor. C/O SOB not related to exertion, dizziness and chills. She states that she was vomiting clear fluid till last night, since then she had 2-3 episodes of hematemesis, once bright red, rest of time it was mixed with secretions. Denies any chest pain, palpitation, headache, change in vision, fever, steatorrhea, change in bowel habits . No urinary complaints.No H/O prior biliary colic, her prior workup for gall stone done 2 yrs. Ago was  Negative.H/O cocaine abuse in past but denies any recent abuse. H/O pancreatitis 2 yrs. Ago but states that this pain is different then that. H/O 1-2 beers on weekends, denies any bing drinking. LMP: 07/27/13  Regular, denies any abnormal vaginal discharge. Meds: Current Facility-Administered Medications  Medication Dose Route Frequency Provider Last Rate Last Dose  . 0.9 %  sodium chloride infusion   Intravenous Continuous Lyndal Pulley, MD      . 0.9 %  sodium chloride infusion   Intravenous STAT Lyndal Pulley, MD      . morphine 2 MG/ML injection 1 mg  1 mg Intravenous Q3H PRN Annett Gula, MD      .  ondansetron [facility]) tablet 4 mg  4 mg Oral Q6H PRN Annett Gula, MD       Or  . ondansetron [facility]) injection 4 mg  4 mg Intravenous Q6H PRN Annett Gula, MD       Current Outpatient Prescriptions  Medication Sig Dispense Refill  . aspirin 325 MG tablet Take 325 mg by mouth every morning.      . Aspirin-Salicylamide-Caffeine (BC HEADACHE POWDER PO) Take 1 packet by mouth every 6 (six) hours as needed (pain).      . carvedilol (COREG) 6.25 MG tablet Take 1 tablet (6.25 mg total) by mouth 2 (two) times daily with a meal.  60 tablet  6  . clopidogrel (PLAVIX) 75 MG tablet Take 1 tablet (75 mg total) by mouth daily.  30 tablet  6  . nitroGLYCERIN (NITROSTAT) 0.4 MG SL tablet Place 1 tablet (0.4 mg total) under the tongue every 5 (five) minutes as needed for chest pain (up to 3 doses).  25 tablet  3  . ondansetron (ZOFRAN-ODT) 8 MG disintegrating tablet Take 8 mg by mouth every 4 (four) hours as needed for nausea. 8mg  ODT q4 hours prn nausea      . simvastatin (ZOCOR) 20 MG tablet Take 20 mg by mouth at bedtime.  Allergies: Allergies as of 08/01/2013  . (No Known Allergies)   Past Medical History  Diagnosis Date  . Stroke   . Blood transfusion   . Anxiety   . Coronary artery disease     Apical LAD infarction '00; NSTEMI s/p BMS to prox LAD '09; Cath 12/2011 single vessel CAD w/ patent LAD stent w/ stable mild ISR, nl LV systolic fxn  . Anemia   . Antithrombin III deficiency     ?pt not sure if true diagnosis  . Tobacco abuse   . High blood pressure    Past Surgical History  Procedure Laterality Date  . Tubal ligation    . Cardiac catheterization    . Esophagogastroduodenoscopy N/A 06/09/2012    Procedure: ESOPHAGOGASTRODUODENOSCOPY (EGD);  Surgeon: Theda Belfast, MD;  Location: [facility];  Service: Endoscopy;  Laterality: N/A;   Family History  Problem Relation Age of Onset  . Heart disease Brother     arrhythmia  . Breast cancer Maternal Aunt    History    Social History  . Marital Status: Divorced    Spouse Name: N/A    Number of Children: N/A  . Years of Education: N/A   Occupational History  . Not on file.   Social History Main Topics  . Smoking status: Current Some Day Smoker -- 0.20 packs/day for 1 years    Types: Cigarettes  . Smokeless tobacco: Not on file  . Alcohol Use: 0.0 oz/week    0 Cans of beer per week     Comment: occasion  . Drug Use: No  . Sexual Activity: Yes    Birth Control/ Protection: None   Other Topics Concern  . Not on file   Social History Narrative  . No narrative on file    Review of Systems: CONSTITUTIONAL:Positive for chills. RESPIRATORY:C/O SOB . CARDIAC:No chestpain, palpitation. Sweating. GI:C/O anorexia, nausea, vomiting, hematemesis, and abdominal pain mostly at LUQ. Denies any change in bowel habit GENITOURINARY:Denies any dysuria, urinary frequency or hematuria. MUSCULOSKELETAL:Backache , mostly around paraspinal region.  Physical Exam: Blood pressure 128/91, pulse 84, temperature 97.4 F (36.3 C), temperature source Oral, resp. rate 14, height 5\' 6"  (1.676 m), weight 68.04 kg (150 lb), SpO2 100.00%. BP 164/99  Pulse 80  Temp(Src) 97.4 F (36.3 C) (Oral)  Resp 16  Ht 5\' 6"  (1.676 m)  Wt 71.668 kg (158 lb)  BMI 25.51 kg/m2  SpO2 99%  General Appearance:    Alert, cooperative, no distress, appears stated age  Head:    Normocephalic, without obvious abnormality, atraumatic  Eyes:    PERRL, conjunctiva/corneas clear, EOM's intact,       Throat:   Lips, mucosa, and tongue normal; teeth and gums normal  Neck:   Supple, symmetrical, trachea midline, no adenopathy;    thyroid:  no enlargement/tenderness/nodules; no carotid   bruit or JVD  Back:     Symmetric, no curvature, ROM normal, paraspinal tenderness on left.  Lungs:     Clear to auscultation bilaterally, respirations unlabored  Chest Wall:    No tenderness or deformity   Heart:    Regular rate and rhythm, S1 and S2  normal, no murmur, rub   or gallop     Abdomen:     Soft, mildly tender at middle and LLQ all the way to the back, no rebound, bowel sounds active all four quadrants,    no masses, no organomegaly     Extremities:   Extremities normal, atraumatic, no cyanosis or  edema  Pulses:   2+ and symmetric all extremities  Skin:   Skin color, texture, turgor normal, no rashes or lesions     Neurologic:   CNII-XII intact, normal strength, sensation and reflexes    throughout    Lab results: @LABTEST @  Imaging results:  No results found.  Other results: EKG: normal EKG, normal sinus rhythm, unchanged from previous tracings.  Assessment & Plan by Problem: 1: ABDOMINAL PAIN: Can be either due to pancreatitis as her lipase is little high or can be due to some GU cause like ovarian cyst as pain was mostly at left lower abdomen, or some mesenteric ischemia cos, of her antithrombin III deficiency, renal caliculi can be a possibility but she denies any dysuria, hematuria. Her pain is vague and her facial expression don't match with her subjective C/O pain. Need to get CT abdomin to confirm any pathology. Keep her NPO, Dilaudid 1mg  4QH PRN,ZOFRAN injection 4mg  prn, protonix injection 40 mg QD. Dextrose 5%- 0.9 sodium chloride 12750ml/hr.  2: HYPERTENSION: Her BP seems ok, hold her home meds.of carvedilol 6.25mg  BID. As she is NPO and keep monitoring. 3: HYPERLIPIDEMIA-MIXED: Continue with her home med. Of simvastatin 20mg  once she is off from NPO. 4: CAD: No acute complaint, EKG does no show any recent change. Get Troponin stat. And continue with home meds. Of Aspirin 325 mg QD, Plavix 75 mg QD, and nitroglycerin SL PRN. After NPO.   This is a Psychologist, occupationalMedical Student Note.  The care of the patient was discussed with Dr. Shirlee LatchMcLean and the assessment and plan was formulated with their assistance.  Please see their note for official documentation of the patient encounter.   Signed: Arnetha CourserSumayya Treyvone Chelf, Med Student 08/01/2013,  12:32 PM

## 2013-08-01 NOTE — ED Notes (Signed)
Patient c/o nausea 1 episode of emesis. Pt given alcohol pads for scent to provide temporary relief of nausea.

## 2013-08-02 ENCOUNTER — Encounter (HOSPITAL_COMMUNITY): Payer: Self-pay | Admitting: Cardiology

## 2013-08-02 ENCOUNTER — Encounter (HOSPITAL_COMMUNITY)
Admission: EM | Disposition: A | Discharge status: Home / Self Care | Payer: Self-pay | Source: Home / Self Care | Attending: Internal Medicine

## 2013-08-02 DIAGNOSIS — I2109 ST elevation (STEMI) myocardial infarction involving other coronary artery of anterior wall: Secondary | ICD-10-CM | POA: Diagnosis not present

## 2013-08-02 DIAGNOSIS — I513 Intracardiac thrombosis, not elsewhere classified: Secondary | ICD-10-CM | POA: Diagnosis present

## 2013-08-02 DIAGNOSIS — I519 Heart disease, unspecified: Secondary | ICD-10-CM

## 2013-08-02 DIAGNOSIS — K859 Acute pancreatitis without necrosis or infection, unspecified: Principal | ICD-10-CM

## 2013-08-02 DIAGNOSIS — K920 Hematemesis: Secondary | ICD-10-CM | POA: Diagnosis present

## 2013-08-02 HISTORY — PX: ESOPHAGOGASTRODUODENOSCOPY: SHX5428

## 2013-08-02 LAB — CBC
HCT: 30.2 % — ABNORMAL LOW (ref 36.0–46.0)
HEMATOCRIT: 30.5 % — AB (ref 36.0–46.0)
Hemoglobin: 10.1 g/dL — ABNORMAL LOW (ref 12.0–15.0)
Hemoglobin: 9.9 g/dL — ABNORMAL LOW (ref 12.0–15.0)
MCH: 30.9 pg (ref 26.0–34.0)
MCH: 30.6 pg (ref 26.0–34.0)
MCHC: 33.1 g/dL (ref 30.0–36.0)
MCHC: 32.8 g/dL (ref 30.0–36.0)
MCV: 93.3 fL (ref 78.0–100.0)
MCV: 93.2 fL (ref 78.0–100.0)
PLATELETS: 139 10*3/uL — AB (ref 150–400)
Platelets: 165 10*3/uL (ref 150–400)
RBC: 3.27 MIL/uL — AB (ref 3.87–5.11)
RBC: 3.24 MIL/uL — ABNORMAL LOW (ref 3.87–5.11)
RDW: 18 % — ABNORMAL HIGH (ref 11.5–15.5)
RDW: 18 % — ABNORMAL HIGH (ref 11.5–15.5)
WBC: 8.3 10*3/uL (ref 4.0–10.5)
WBC: 7.5 10*3/uL (ref 4.0–10.5)

## 2013-08-02 LAB — HEPARIN LEVEL (UNFRACTIONATED)
Heparin Unfractionated: 0.42 IU/mL (ref 0.30–0.70)
Heparin Unfractionated: 0.33 IU/mL (ref 0.30–0.70)

## 2013-08-02 LAB — HEMOGLOBIN A1C
Hgb A1c MFr Bld: 5.2 % (ref <5.7)
Mean Plasma Glucose: 103 mg/dL (ref <117)

## 2013-08-02 LAB — RAPID URINE DRUG SCREEN, HOSP PERFORMED
Amphetamines: NOT DETECTED
Barbiturates: NOT DETECTED
Benzodiazepines: NOT DETECTED
COCAINE: NOT DETECTED
OPIATES: NOT DETECTED
TETRAHYDROCANNABINOL: NOT DETECTED

## 2013-08-02 LAB — COMPREHENSIVE METABOLIC PANEL
ALK PHOS: 138 U/L — AB (ref 39–117)
ALT: 29 U/L (ref 0–35)
AST: 28 U/L (ref 0–37)
Albumin: 3 g/dL — ABNORMAL LOW (ref 3.5–5.2)
BUN: 5 mg/dL — AB (ref 6–23)
CHLORIDE: 107 meq/L (ref 96–112)
CO2: 20 meq/L (ref 19–32)
Calcium: 8.8 mg/dL (ref 8.4–10.5)
Creatinine, Ser: 0.57 mg/dL (ref 0.50–1.10)
GLUCOSE: 136 mg/dL — AB (ref 70–99)
POTASSIUM: 3.9 meq/L (ref 3.7–5.3)
Sodium: 139 mEq/L (ref 137–147)
Total Bilirubin: 0.6 mg/dL (ref 0.3–1.2)
Total Protein: 6.6 g/dL (ref 6.0–8.3)

## 2013-08-02 LAB — GLUCOSE, CAPILLARY
Glucose-Capillary: 116 mg/dL — ABNORMAL HIGH (ref 70–99)
Glucose-Capillary: 111 mg/dL — ABNORMAL HIGH (ref 70–99)
Glucose-Capillary: 103 mg/dL — ABNORMAL HIGH (ref 70–99)

## 2013-08-02 LAB — HIV ANTIBODY (ROUTINE TESTING W REFLEX): HIV 1&2 Ab, 4th Generation: NONREACTIVE

## 2013-08-02 SURGERY — EGD (ESOPHAGOGASTRODUODENOSCOPY)
Anesthesia: Moderate Sedation

## 2013-08-02 MED ORDER — PERFLUTREN LIPID MICROSPHERE
1.0000 mL | INTRAVENOUS | Status: AC | PRN
  Administered 2013-08-02: 3 mL via INTRAVENOUS
  Filled 2013-08-02: qty 10

## 2013-08-02 MED ORDER — BUTAMBEN-TETRACAINE-BENZOCAINE 2-2-14 % EX AERO
INHALATION_SPRAY | CUTANEOUS | Status: DC | PRN
  Administered 2013-08-02: 2 via TOPICAL

## 2013-08-02 MED ORDER — MIDAZOLAM HCL 10 MG/2ML IJ SOLN
INTRAMUSCULAR | Status: DC | PRN
Start: 2013-08-02 — End: 2013-08-02
  Administered 2013-08-02 (×4): 2 mg via INTRAVENOUS

## 2013-08-02 MED ORDER — SODIUM CHLORIDE 0.9 % IV SOLN
INTRAVENOUS | Status: DC
Start: 2013-08-02 — End: 2013-08-04
  Administered 2013-08-02: 175 mL/h via INTRAVENOUS
  Administered 2013-08-02: 1000 mL via INTRAVENOUS
  Administered 2013-08-03 (×2): via INTRAVENOUS
  Administered 2013-08-03: 175 mL/h via INTRAVENOUS
  Administered 2013-08-03 – 2013-08-04 (×3): via INTRAVENOUS

## 2013-08-02 MED ORDER — PANTOPRAZOLE SODIUM 40 MG IV SOLR
40.0000 mg | Freq: Two times a day (BID) | INTRAVENOUS | Status: DC
Start: 2013-08-02 — End: 2013-08-03
  Administered 2013-08-02 (×2): 40 mg via INTRAVENOUS
  Filled 2013-08-02 (×4): qty 40

## 2013-08-02 MED ORDER — FENTANYL CITRATE 0.05 MG/ML IJ SOLN
INTRAMUSCULAR | Status: AC
  Filled 2013-08-02: qty 2

## 2013-08-02 MED ORDER — SODIUM CHLORIDE 0.9 % IV SOLN
INTRAVENOUS | Status: DC
Start: 2013-08-02 — End: 2013-08-05
  Administered 2013-08-02: 200 mL via INTRAVENOUS
  Administered 2013-08-04: 16:00:00 via INTRAVENOUS

## 2013-08-02 MED ORDER — FENTANYL CITRATE 0.05 MG/ML IJ SOLN
INTRAMUSCULAR | Status: DC | PRN
  Administered 2013-08-02 (×2): 25 ug via INTRAVENOUS

## 2013-08-02 MED ORDER — MIDAZOLAM HCL 5 MG/ML IJ SOLN
INTRAMUSCULAR | Status: AC
  Filled 2013-08-02: qty 2

## 2013-08-02 NOTE — Progress Notes (Signed)
Subjective: Patient was looking comfortable and walking around room, states that she had 2 episodes of vomiting with out any blood since last night after she drank contrast for her CT abdomen which maked her nauseated. Still C/O anorexia with slight improvement in abdominal pain, she states that her pain is still 8/10 but better then yesterday?  No other complaints. Objective: Vital signs in last 24 hours: Filed Vitals:   08/02/13 1450 08/02/13 1500 08/02/13 1510 08/02/13 1532  BP: 92/71 90/59 101/68 120/87  Pulse: 80 72 69 63  Temp:    97.5 F (36.4 C)  TempSrc:    Oral  Resp: 13 12 12 20   Height:      Weight:      SpO2: 100% 96% 95% 100%   Weight change:  No intake or output data in the 24 hours ending 08/02/13 1559 EXAM: GENERAL: Alert, not in distress HEENT: No pallor, PERRL, EOMI. NECK: supple, no adenopathy, trachea central. No carotid bruit. LUNG: clear B/L HEART: rrr, S1 and S2 , no mrg ABDOMEN: Soft, non distended, mildly tender left middle and lower quadrant, no rebound or guarding. MUSCULOSKELETAL: Mild paraspinal tenderness along lumbar region on left side. EXTREMITIES: No cyanosis, no edema. PP 2+ B/L Lab Results: @LABTEST2 @ Micro Results: No results found for this or any previous visit (from the past 240 hour(s)). Studies/Results: Ct Abdomen Pelvis W Contrast  08/01/2013   CLINICAL DATA:  Mid abdominal pain. Nausea and vomiting. Possible pancreatitis.  EXAM: CT ABDOMEN AND PELVIS WITH CONTRAST  TECHNIQUE: Multidetector CT imaging of the abdomen and pelvis was performed using the standard protocol following bolus administration of intravenous contrast.  CONTRAST:  OMNIPAQUE IOHEXOL 300 MG/ML  SOLN  COMPARISON:  CT of the abdomen and pelvis 06/10/2011.  FINDINGS: Lung Bases: There is a small filling defect within the apex of the left ventricle, best appreciated on images 6-8 of series 2, suspicious for a left ventricular apical thrombus. There is some myocardial  thinning and low-attenuation throughout the left ventricular apex, which appears bowed slightly outward, suggesting fibrofatty remodeling related to prior left anterior descending territory myocardial infarction.  Abdomen/Pelvis: Pancreatic parenchyma enhances normally at this time. No focal pancreatic lesions. No pancreatic ductal dilatation. However, there is extensive peripancreatic stranding throughout the retroperitoneal fat, indicative of acute inflammation. No peripancreatic fluid collections are noted at this time to suggest pseudocyst formation. The splenic vein, superior mesenteric vein, splenoportal confluence and portal veins are all patent at this time.  Diffuse low attenuation throughout the hepatic parenchyma, suggestive of hepatic steatosis (difficult to say for certain on today's contrast-enhanced CT scan). No focal hepatic lesions are noted. The appearance of the gallbladder, spleen, bilateral adrenal glands and the right kidney is unremarkable. Sub cm low-attenuation lesion in the lower pole of the left kidney is too small to definitively characterize, but is statistically likely to represent a tiny cyst. No significant volume of ascites. No pneumoperitoneum. No pathologic distention of small bowel. No definite lymphadenopathy identified within the abdomen or pelvis. Normal appendix. Numerous colonic diverticulae are noted, particularly in the descending and sigmoid colon, without surrounding inflammatory changes to suggest an acute diverticulitis at this time. Uterus and bilateral ovaries are unremarkable in appearance. Urinary bladder is normal in appearance.  Musculoskeletal: There are no aggressive appearing lytic or blastic lesions noted in the visualized portions of the skeleton.  IMPRESSION: 1. Imaging findings are compatible with acute pancreatitis. No pancreatic pseudocyst or evidence of frank pancreatic necrosis is noted at this time.  2. Findings are highly suspicious for potential left  ventricular apical thrombus (likely related to prior distal LAD territory myocardial infarction). This places the patient at high risk for systemic embolization, and consultation with Cardiology and further evaluation with echocardiography is strongly recommended in the near future. 3. Diffuse low attenuation throughout the hepatic parenchyma, suggestive of hepatic steatosis. This difficult to confirm on today's contrast enhanced CT scan. 4. Colonic diverticulosis without findings to suggest acute diverticulitis at this time. 5. Additional incidental findings, as above. These results were called by telephone at the time of interpretation on 08/01/2013 at 9:56 PM to nurse Aggie Cosierheresa for Dr. Aletta EdouardSHILPA BHARDWAJ, who verbally acknowledged these results.   Electronically Signed   By: Trudie Reedaniel  Entrikin M.D.   On: 08/01/2013 22:00   Medications: medication reviewed Scheduled Meds: . pantoprazole (PROTONIX) IV  40 mg Intravenous Q12H  . sodium chloride  3 mL Intravenous Q12H   Continuous Infusions: . sodium chloride 1,000 mL (08/02/13 0832)  . sodium chloride    . heparin 1,150 Units/hr (08/02/13 0122)   PRN Meds:.HYDROmorphone (DILAUDID) injection, ondansetron (ZOFRAN) IV, ondansetron Assessment/Plan: ACUTE PANCREATITIS: Pain slightly decreased, has last episode of vomiting in the morning after CT contrast, was clear fluid, no hematemesis. Continue with NPO and IV saline with dilauded PRN, and zofran PRN. HEMATEMESIS: HAD her EGD which show 1. LA Class B esophagitis  2. Erosive gastritis in the gastric body  3. Duodenitis in the bulb and second portion of the duodenum  4. Small hiatal hernia We will follow Dr. Russella DarStark recommendation for  PPI bid for 4 weeks, then PPI qam long term and watch for any GI bleed as she is on heparin . APICAL MURAL THROMBUS: Had her ECHO today which shows LV thrombus of 1610mmx5mm. With some akinesia of the apical myocardium and grade 1 diastolic dysfunction with EF of 55-60%. She need  long term anticoagulation, on heparin drip currently, continue with that with a careful watch for any bleed and monitor her CBC. HTN: Currently she is normotensive, and NPO . Can restart her home med.  HYPERLIPIDEMIA: Continue with her statin once pancreatitis resolved. ANTITHROMBIN III DEFICIENCY: She needs long term anticoagulation.  This is a Psychologist, occupationalMedical Student Note.  The care of the patient was discussed with Dr. Delane GingerGill and the assessment and plan formulated with their assistance.  Please see their attached note for official documentation of the daily encounter.   LOS: 1 day   Arnetha CourserSumayya Kayti Poss, Med Student 08/02/2013, 3:59 PM

## 2013-08-02 NOTE — Progress Notes (Signed)
ANTICOAGULATION CONSULT NOTE - Initial Consult  Pharmacy Consult for Heparin Indication: apical thrombus   No Known Allergies  Patient Measurements: Height: 5\' 6"  (167.6 cm) Weight: 167 lb 11.2 oz (76.068 kg) IBW/kg (Calculated) : 59.3  Vital Signs: Temp: 97.5 F (36.4 C) (05/19 1532) Temp src: Oral (05/19 1532) BP: 120/87 mmHg (05/19 1532) Pulse Rate: 63 (05/19 1532)  Labs:  Recent Labs  08/01/13 0937 08/01/13 1850 08/02/13 0745 08/02/13 0850 08/02/13 1530  HGB 11.0*  --  10.1*  --   --   HCT 33.9*  --  30.5*  --   --   PLT 139*  --  139*  --   --   HEPARINUNFRC  --   --   --  0.42 0.33  CREATININE 0.66  --  0.57  --   --   TROPONINI  --  <0.30  --   --   --     Estimated Creatinine Clearance: 93.5 ml/min (by C-G formula based on Cr of 0.57).   Medical History: Past Medical History  Diagnosis Date  . Stroke     assoc with short term memory loss and right peripheral vision loss; age 37   . Blood transfusion   . Anxiety   . Coronary artery disease     Apical LAD infarction '00; NSTEMI s/p BMS to prox LAD '09; Cath 12/2011 single vessel CAD w/ patent LAD stent w/ stable mild ISR, nl LV systolic fxn  . Anemia   . Antithrombin III deficiency     ?pt not sure if true diagnosis  . Tobacco abuse   . High blood pressure   . Myocardial infarction     Medications:  Prescriptions prior to admission  Medication Sig Dispense Refill  . aspirin 325 MG tablet Take 325 mg by mouth every morning.      . Aspirin-Salicylamide-Caffeine (BC HEADACHE POWDER PO) Take 1 packet by mouth every 6 (six) hours as needed (pain).      . carvedilol (COREG) 6.25 MG tablet Take 1 tablet (6.25 mg total) by mouth 2 (two) times daily with a meal.  60 tablet  6  . clopidogrel (PLAVIX) 75 MG tablet Take 1 tablet (75 mg total) by mouth daily.  30 tablet  6  . nitroGLYCERIN (NITROSTAT) 0.4 MG SL tablet Place 1 tablet (0.4 mg total) under the tongue every 5 (five) minutes as needed for chest pain  (up to 3 doses).  25 tablet  3  . ondansetron (ZOFRAN-ODT) 8 MG disintegrating tablet Take 8 mg by mouth every 4 (four) hours as needed for nausea. 8mg  ODT q4 hours prn nausea      . simvastatin (ZOCOR) 20 MG tablet Take 20 mg by mouth at bedtime.        Assessment: 43 yo female who has possible apical thrombus seen on CT. She was started on IV heparin with first and second heparin level (0.42 and 0.33) therapeutic. Hgb 10.1, plt 139 K, slightly low but stable. No bleeding noted per chart.   Goal of Therapy:  Heparin level 0.3-0.7 units/ml Monitor platelets by anticoagulation protocol: Yes   Plan:  Increase heparin infusion to 1250 units/hr - heparin level is therapeutic and want to keep it so. Daily Heparin level and CBC.  Celedonio Miyamoto, PharmD, BCPS Clinical Pharmacist Pager [phone]    08/02/2013,4:57 PM

## 2013-08-02 NOTE — ED Provider Notes (Signed)
I saw and evaluated the patient, reviewed the resident's note and I agree with the findings and plan.   EKG Interpretation   Date/Time:  Monday Aug 01 2013 08:25:04 EDT Ventricular Rate:  91 PR Interval:  130 QRS Duration: 73 QT Interval:  368 QTC Calculation: 453 R Axis:   39 Text Interpretation:  Sinus rhythm Baseline wander in lead(s) III aVL No  significant change since last tracing Confirmed by Taiwana Willison  MD, Caryn Bee  ([id number]) on 08/01/2013 8:42:29 AM      Pt c/o mid to upper abd pain, radiating to back. Nausea. Epigastric tenderness. No rebound or guarding. Labs. Iv ns.   Suzi Roots, MD 08/02/13 [phone]

## 2013-08-02 NOTE — H&P (View-Only) (Signed)
[facility] Consult: 10:46 AM 08/02/2013  LOS: 1 day    Referring Provider: Dr Gordy Levan, resident for teaching service Primary Care Physician:  None.  Previously cardiac care by Dr Verl Blalock Primary Gastroenterologist: Althia Forts has seen Dr. Benson Norway and Dr. Ardis Hughs previously     Reason for Consultation:  Hematemesis.    HPI: Lindsey [name] is a 43 y.o. female.  Hx CVA, MI, CAD, MI 2069mwith apical infarct and aneurysm, BM cardiac stent 2009, single vessel CAD and patent stent with normal EF on 12/2011 cardiac cath, possible factor ATIII deficiency. Hyperlipidemia.  Hx anemia.  Acute pancreatitis likely due to ETOH in 12/2010.  No GB disease or biliary tree pathology on 12/2010 ultrasound or CT but it did show fatty liver.  CT confirmed uncomplicated pancreatitis. Recurrent pancreatitis (still actively drinking ETOH) by CT in 05/2011. Lipase 630 in 12/2010 and 1685 in 05/2011.  LFTs on both occasions normal or minimally elevated.  EGD in 3/104 for hematemesis showed a HH.  Treated with PPI, but has not used this other than prn for at least 18 months.   On chronic Plavix and 325 ASA.  Colonoscopy in 12/2007 for intermittent rectal bleeding and iron deficient anemia.  Showed tics and external hemorrhoids   Admitted 5/18 with several days progressive abdominal pain, anorexia, N/V.  Pain in epigastrium radiating to LUQ and left scapula area.  Used about 4 BC powders to control pain, helpful until unable to keep meds down.   After vomiting clears, started to see blood in emesis.  Probably 10 episodes total, eventually blood cleared after admission and starting on BID, IV Protonix.  Lipase is 629, transaminases 66/42, alk phos is 165 otherwise normal LFTs. Triglycerides 131.  CT scan shows pancreatitis and also left ventricular apical  thrombus. Echo confirms this finding. She has been started on Heparin infusion and has no recurrent  hematemesis and no melena at all.  Will need long term oral AC.   Admits to drinking 4 beers per month average.  Drank one beer a week before onset of sxs. No new meds.  Several months of anorexia, mild occasional nausea but no emesis until recent days.  Lost 30 #.     Past Medical History  Diagnosis Date  . Stroke     assoc with short term memory loss and right peripheral vision loss; age 43  . Blood transfusion   . Anxiety   . Coronary artery disease     Apical LAD infarction '00; NSTEMI s/p BMS to prox LAD '09; Cath 12/2011 single vessel CAD w/ patent LAD stent w/ stable mild ISR, nl LV systolic fxn  . Anemia   . Antithrombin III deficiency     ?pt not sure if true diagnosis  . Tobacco abuse   . High blood pressure   . Myocardial infarction   . TIA (transient ischemic attack)     Past Surgical History  Procedure Laterality Date  . Tubal ligation    . Cardiac catheterization    . Esophagogastroduodenoscopy N/A 06/09/2012  Procedure: ESOPHAGOGASTRODUODENOSCOPY (EGD);  Surgeon: Beryle Beams, MD;  Location: [facility];  Service: Endoscopy;  Laterality: N/A;  . Coronary angioplasty      Prior to Admission medications   Medication Sig Start Date End Date Taking? Authorizing Provider  aspirin 325 MG tablet Take 325 mg by mouth every morning.   Yes Historical Provider, MD  Aspirin-Salicylamide-Caffeine (BC HEADACHE POWDER PO) Take 1 packet by mouth every 6 (six) hours as needed (pain).   Yes Historical Provider, MD  carvedilol (COREG) 6.25 MG tablet Take 1 tablet (6.25 mg total) by mouth 2 (two) times daily with a meal. 12/24/11  Yes Jessica A Hope, PA-C  clopidogrel (PLAVIX) 75 MG tablet Take 1 tablet (75 mg total) by mouth daily. 06/09/12  Yes Thurnell Lose, MD  nitroGLYCERIN (NITROSTAT) 0.4 MG SL tablet Place 1 tablet (0.4 mg total) under the tongue every 5 (five) minutes  as needed for chest pain (up to 3 doses). 12/24/11  Yes Jessica A Hope, PA-C  ondansetron (ZOFRAN-ODT) 8 MG disintegrating tablet Take 8 mg by mouth every 4 (four) hours as needed for nausea. 25m ODT q4 hours prn nausea 08/31/12  Yes JBabette Relic MD  simvastatin (ZOCOR) 20 MG tablet Take 20 mg by mouth at bedtime.   Yes Historical Provider, MD    Scheduled Meds: . pantoprazole (PROTONIX) IV  40 mg Intravenous Q12H  . sodium chloride  3 mL Intravenous Q12H   Infusions: . sodium chloride 1,000 mL (08/02/13 0832)  . heparin 1,150 Units/hr (08/02/13 0122)   PRN Meds: HYDROmorphone (DILAUDID) injection, ondansetron (ZOFRAN) IV, ondansetron   Allergies as of 08/01/2013  . (No Known Allergies)    Family History  Problem Relation Age of Onset  . Heart disease Brother     arrhythmia; died  . Breast cancer Maternal Aunt     History   Social History  . Marital Status: Divorced    Spouse Name: N/A    Number of Children: N/A  . Years of Education: N/A   Occupational History  . Not on file.   Social History Main Topics  . Smoking status: Current Some Day Smoker -- 0.20 packs/day for 1 years    Types: Cigarettes  . Smokeless tobacco: Never Used  . Alcohol Use: 0.0 oz/week    0 Cans of beer per week     Comment: occasion  . Drug Use: No  . Sexual Activity: Yes    Birth Control/ Protection: None   Other Topics Concern  . Not on file   Social History Narrative  . No narrative on file    REVIEW OF SYSTEMS: Constitutional:  No weakness until recently ENT:  No nose bleeds Pulm:  No dyspnea, no cough CV:  Some tachycardia yeasterday, now resolved. no LE edema.  GU:  No hematuria, no frequency GI:  Per HPI. Heme:  No iron in use at home   Transfusions:  Yes in 2004 several months after birth of child (vaginal delivery) for menorrhagia. Neuro:  No headaches, no peripheral tingling or numbness Derm:  No itching, no rash or sores.  Endocrine:  No sweats or chills.  No  polyuria or dysuria Immunization:  No queried Travel:  None beyond local counties in last few months.    PHYSICAL EXAM: Vital signs in last 24 hours: Filed Vitals:   08/02/13 0535  BP: 131/81  Pulse: 74  Temp: 98 F (36.7 C)  Resp: 16   Wt Readings from Last 3 Encounters:  08/02/13 76.068 kg (167 lb 11.2 oz)  10/10/12 81.647 kg (180 lb)  08/30/12 83.008 kg (183 lb)    General: looks well, comfortable, alert  Head:  No swelling or asymmetry  Eyes:  No icterus or pallor Ears:  Not HOH.  Nose:  No congestion or discharge Mouth:  Clear and moist.  No lesions, no blood Neck:  No mass, no JVD, no bruits, no TMG Lungs:  Clear bil.  Unlabored breathing, no cough Heart: RRR.  No MRG Abdomen:  Soft, ND, active BS, no mass or HSM, no bruits, no hernias.  Mild to minor tenderness in upper abdomen, > on left.  No guard or rebound. .   Rectal: deferred   Musc/Skeltl: no joint swelling or deformity Extremities:  No pedal edema.  Feet warm  Neurologic:  Oriented x 3. No limb weakness.  No tremor.  Engaged. Good historian Skin:  No telangectasia or rash Tattoos:  None seen Nodes:  No cervical adenopathy   Psych:  Pleasant, relaxed, cooperative.   Intake/Output from previous day: 05/18 0701 - 05/19 0700 In: 1000 [I.V.:1000] Out: 220 [Urine:20; Emesis/NG output:200] Intake/Output this shift:    LAB RESULTS:  Recent Labs  08/01/13 0937 08/02/13 0745  WBC 9.0 8.3  HGB 11.0* 10.1*  HCT 33.9* 30.5*  PLT 139* 139*   BMET Lab Results  Component Value Date   NA 139 08/02/2013   NA 135* 08/01/2013   NA 137 08/30/2012   K 3.9 08/02/2013   K 4.3 08/01/2013   K 3.7 08/30/2012   CL 107 08/02/2013   CL 100 08/01/2013   CL 96 08/30/2012   CO2 20 08/02/2013   CO2 15* 08/01/2013   CO2 21 08/30/2012   GLUCOSE 136* 08/02/2013   GLUCOSE 89 08/01/2013   GLUCOSE 136* 08/30/2012   BUN 5* 08/02/2013   BUN 10 08/01/2013   BUN 7 08/30/2012   CREATININE 0.57 08/02/2013   CREATININE 0.66 08/01/2013    CREATININE 0.80 08/30/2012   CALCIUM 8.8 08/02/2013   CALCIUM 8.7 08/01/2013   CALCIUM 9.8 08/30/2012   LFT  Recent Labs  08/01/13 0937 08/02/13 0745  PROT 7.5 6.6  ALBUMIN 3.3* 3.0*  AST 66* 28  ALT 42* 29  ALKPHOS 165* 138*  BILITOT 0.7 0.6   PT/INR Lab Results  Component Value Date   INR 1.04 12/23/2011   INR 0.96 06/29/2011   INR 0.90 12/23/2010   Lipase     Component Value Date/Time   LIPASE 629* 08/01/2013 0937    Drugs of Abuse     Component Value Date/Time   LABOPIA NONE DETECTED 12/23/2010 2328   COCAINSCRNUR NONE DETECTED 12/23/2010 2328   LABBENZ NONE DETECTED 12/23/2010 2328   AMPHETMU NONE DETECTED 12/23/2010 2328   THCU NONE DETECTED 12/23/2010 2328   LABBARB NONE DETECTED 12/23/2010 2328     RADIOLOGY STUDIES: Ct Abdomen Pelvis W Contrast 08/01/2013  COMPARISON:  CT of the abdomen and pelvis 06/10/2011.  FINDINGS: Lung Bases: There is a small filling defect within the apex of the left ventricle, best appreciated on images 6-8 of series 2, suspicious for a left ventricular apical thrombus. There is some myocardial thinning and low-attenuation throughout the left ventricular apex, which appears bowed slightly outward, suggesting fibrofatty remodeling related to prior left anterior descending territory myocardial infarction.  Abdomen/Pelvis: Pancreatic parenchyma enhances normally at this time. No focal pancreatic lesions. No pancreatic ductal dilatation. However, there is extensive peripancreatic stranding throughout the retroperitoneal fat, indicative of acute inflammation.  No peripancreatic fluid collections are noted at this time to suggest pseudocyst formation. The splenic vein, superior mesenteric vein, splenoportal confluence and portal veins are all patent at this time.  Diffuse low attenuation throughout the hepatic parenchyma, suggestive of hepatic steatosis (difficult to say for certain on today's contrast-enhanced CT scan). No focal hepatic lesions are noted. The  appearance of the gallbladder, spleen, bilateral adrenal glands and the right kidney is unremarkable. Sub cm low-attenuation lesion in the lower pole of the left kidney is too small to definitively characterize, but is statistically likely to represent a tiny cyst. No significant volume of ascites. No pneumoperitoneum. No pathologic distention of small bowel. No definite lymphadenopathy identified within the abdomen or pelvis. Normal appendix. Numerous colonic diverticulae are noted, particularly in the descending and sigmoid colon, without surrounding inflammatory changes to suggest an acute diverticulitis at this time. Uterus and bilateral ovaries are unremarkable in appearance. Urinary bladder is normal in appearance.  Musculoskeletal: There are no aggressive appearing lytic or blastic lesions noted in the visualized portions of the skeleton.  IMPRESSION: 1. Imaging findings are compatible with acute pancreatitis. No pancreatic pseudocyst or evidence of frank pancreatic necrosis is noted at this time. 2. Findings are highly suspicious for potential left ventricular apical thrombus (likely related to prior distal LAD territory myocardial infarction). This places the patient at high risk for systemic embolization, and consultation with Cardiology and further evaluation with echocardiography is strongly recommended in the near future. 3. Diffuse low attenuation throughout the hepatic parenchyma, suggestive of hepatic steatosis. This difficult to confirm on today's contrast enhanced CT scan. 4. Colonic diverticulosis without findings to suggest acute diverticulitis at this time. 5. Additional incidental findings, as above. These results were called by telephone at the time of interpretation on 08/01/2013 at 9:56 PM to nurse Clarene Critchley for Dr. Madilyn Fireman, who verbally acknowledged these results.   Electronically Signed   By: Vinnie Langton M.D.   On: 08/01/2013 22:00    ENDOSCOPIC STUDIES: 05/2012  EGD  Dr  Benson Norway For eval of hematemesis IMPRESSION:  1) 2 cm hiatal hernia  12/2007  Colonoscopy   Dr Ardis Hughs For intermittent rectal bleeding and IDA Diverticulosis, hemorrhoids, otherwise normal examination. She has  been hemoccult negative twice this admission. No plans for further  GI testing. tTG and IgA levels are pending (celiac sprue can cause  heme negative IDA). tTG normal and IgA 115.      IMPRESSION:   *  Acute pancreatitis, recurrent.  Previous bouts of pancreatitis attributed to ETOH. No apparent biliary disease other than fatty liver. Currently (if pt is honest) she is not drinking much.  Triglycerides not elevated  *  Hematemesis. Resolved. Hx of same, EGD 2014 with [place]. Only using PPI PRN.   Several months of anorexia and mild nausea.    *  Left ventricular apical thrombus. On heparin gtt, will need long term oral AC.  Chronic Plavix PTA, currently on hold.   *  Fatty liver.     PLAN:     *  EGD this afternoon, d/w pt.  Will arrange with Dr Fuller Plan.     Vena Rua  08/02/2013, 10:46 AM Pager: [phone]      Attending physician's note   I have taken a history, examined the patient and reviewed the chart. I agree with the Advanced Practitioner's note, impression and recommendations. Acute, recurrent pancreatitis-previous episodes felt to be etoh related. Self limited hematemesis. EGD today as anticoagulation is planned.   Norberto Sorenson  Sindy Guadeloupe, MD [facility]

## 2013-08-02 NOTE — Op Note (Signed)
[facility] [address] [place], [address]   ENDOSCOPY PROCEDURE REPORT  PATIENT: Lindsey [name], Lindsey [name]  MR#: [id number] BIRTHDATE: 1970/07/12 , 42  yrs. old GENDER: Female ENDOSCOPIST: Meryl Dare, MD, [facility] REFERRED BY:  [facility] PROCEDURE DATE:  08/02/2013 PROCEDURE:  EGD, diagnostic ASA CLASS:     Class III INDICATIONS:  Hematemesis. MEDICATIONS: These medications were titrated to patient response per physician's verbal order, Fentanyl 50 mcg IV, and Versed 8 mg IV TOPICAL ANESTHETIC: Cetacaine Spray DESCRIPTION OF PROCEDURE: After the risks benefits and alternatives of the procedure were thoroughly explained, informed consent was obtained.  The Pentax Gastroscope [id number] endoscope was introduced through the mouth and advanced to the second portion of the duodenum. Limited by retained gastric solids.  The instrument was slowly withdrawn as the mucosa was fully examined.  ESOPHAGUS: There was LA Class B esophagitis noted.  The esophagus was otherwise normal. STOMACH: Mild erosive gastritis was found in the gastric body. The stomach otherwise appeared normal but the exam limited by retained solids in the body. DUODENUM: Mild duodenal inflammation was found in the bulb and second portion of the duodenum.  Retroflexed views revealed a small hiatal hernia.  The scope was then withdrawn from the patient and the procedure completed. COMPLICATIONS: There were no complications.  ENDOSCOPIC IMPRESSION: 1.   LA Class B esophagitis 2.   Erosive gastritis in the gastric body 3.   Duodenitis in the bulb and second portion of the duodenum 4.   Small hiatal hernia  RECOMMENDATIONS: 1.  Anti-reflux regimen long term 2.  PPI bid for 4 weeks, then PPI qam long term 3.  Avoid ASA/NSAIDS 4.  Given findings there is a mildly increased risk of UGI bleeding on anticoagulation therapy. Would monitor closely for signs, symptoms of UGI bleeding,  especially for the next 4-6 week. The erosive and inflammatory changes should all resolve, and the risk of UGI bleeding should decrease, over several weeks of PPI therapy and avoidance of etoh and NSAIDs.  eSigned:  Meryl Dare, MD, [facility] 08/02/2013 2:38 PM

## 2013-08-02 NOTE — Progress Notes (Signed)
ANTICOAGULATION CONSULT NOTE - Initial Consult  Pharmacy Consult for Heparin Indication: apical thrombus   No Known Allergies  Patient Measurements: Height: 5\' 6"  (167.6 cm) Weight: 167 lb 11.2 oz (76.068 kg) IBW/kg (Calculated) : 59.3  Vital Signs: Temp: 98 F (36.7 C) (05/19 0535) Temp src: Oral (05/19 0535) BP: 131/81 mmHg (05/19 0535) Pulse Rate: 74 (05/19 0535)  Labs:  Recent Labs  08/01/13 0937 08/01/13 1850 08/02/13 0745 08/02/13 0850  HGB 11.0*  --  10.1*  --   HCT 33.9*  --  30.5*  --   PLT 139*  --  139*  --   HEPARINUNFRC  --   --   --  0.42  CREATININE 0.66  --  0.57  --   TROPONINI  --  <0.30  --   --     Estimated Creatinine Clearance: 93.5 ml/min (by C-G formula based on Cr of 0.57).   Medical History: Past Medical History  Diagnosis Date  . Stroke     assoc with short term memory loss and right peripheral vision loss; age 64   . Blood transfusion   . Anxiety   . Coronary artery disease     Apical LAD infarction '00; NSTEMI s/p BMS to prox LAD '09; Cath 12/2011 single vessel CAD w/ patent LAD stent w/ stable mild ISR, nl LV systolic fxn  . Anemia   . Antithrombin III deficiency     ?pt not sure if true diagnosis  . Tobacco abuse   . High blood pressure   . Myocardial infarction   . TIA (transient ischemic attack)     Medications:  Prescriptions prior to admission  Medication Sig Dispense Refill  . aspirin 325 MG tablet Take 325 mg by mouth every morning.      . Aspirin-Salicylamide-Caffeine (BC HEADACHE POWDER PO) Take 1 packet by mouth every 6 (six) hours as needed (pain).      . carvedilol (COREG) 6.25 MG tablet Take 1 tablet (6.25 mg total) by mouth 2 (two) times daily with a meal.  60 tablet  6  . clopidogrel (PLAVIX) 75 MG tablet Take 1 tablet (75 mg total) by mouth daily.  30 tablet  6  . nitroGLYCERIN (NITROSTAT) 0.4 MG SL tablet Place 1 tablet (0.4 mg total) under the tongue every 5 (five) minutes as needed for chest pain (up to 3  doses).  25 tablet  3  . ondansetron (ZOFRAN-ODT) 8 MG disintegrating tablet Take 8 mg by mouth every 4 (four) hours as needed for nausea. 8mg  ODT q4 hours prn nausea      . simvastatin (ZOCOR) 20 MG tablet Take 20 mg by mouth at bedtime.        Assessment: 43 yo female who has possible apical thrombus seen on CT. She was started on IV heparin with first heparin level (0.42) therapeutic. Hgb 10.1, plt 139 K, slightly low but stable. No bleeding noted per chart.   Goal of Therapy:  Heparin level 0.3-0.7 units/ml Monitor platelets by anticoagulation protocol: Yes   Plan:  Continue heparin infusion 1150 units/hr Check confirmatory heparin level at 1600.   Daily Heparin level and CBC  Bayard Hugger, PharmD, BCPS  Clinical Pharmacist  Pager: [phone]   08/02/2013,10:01 AM

## 2013-08-02 NOTE — Consult Note (Signed)
[facility] Consult: 10:46 AM 08/02/2013  LOS: 1 day    Referring Provider: Dr Gordy Levan, resident for teaching service Primary Care Physician:  None.  Previously cardiac care by Dr Verl Blalock Primary Gastroenterologist: Althia Forts has seen Dr. Benson Norway and Dr. Ardis Hughs previously     Reason for Consultation:  Hematemesis.    HPI: Lindsey [name] is a 43 y.o. female.  Hx CVA, MI, CAD, MI 2072mwith apical infarct and aneurysm, BM cardiac stent 2009, single vessel CAD and patent stent with normal EF on 12/2011 cardiac cath, possible factor ATIII deficiency. Hyperlipidemia.  Hx anemia.  Acute pancreatitis likely due to ETOH in 12/2010.  No GB disease or biliary tree pathology on 12/2010 ultrasound or CT but it did show fatty liver.  CT confirmed uncomplicated pancreatitis. Recurrent pancreatitis (still actively drinking ETOH) by CT in 05/2011. Lipase 630 in 12/2010 and 1685 in 05/2011.  LFTs on both occasions normal or minimally elevated.  EGD in 3/104 for hematemesis showed a HH.  Treated with PPI, but has not used this other than prn for at least 18 months.   On chronic Plavix and 325 ASA.  Colonoscopy in 12/2007 for intermittent rectal bleeding and iron deficient anemia.  Showed tics and external hemorrhoids   Admitted 5/18 with several days progressive abdominal pain, anorexia, N/V.  Pain in epigastrium radiating to LUQ and left scapula area.  Used about 4 BC powders to control pain, helpful until unable to keep meds down.   After vomiting clears, started to see blood in emesis.  Probably 10 episodes total, eventually blood cleared after admission and starting on BID, IV Protonix.  Lipase is 629, transaminases 66/42, alk phos is 165 otherwise normal LFTs. Triglycerides 131.  CT scan shows pancreatitis and also left ventricular apical  thrombus. Echo confirms this finding. She has been started on Heparin infusion and has no recurrent  hematemesis and no melena at all.  Will need long term oral AC.   Admits to drinking 4 beers per month average.  Drank one beer a week before onset of sxs. No new meds.  Several months of anorexia, mild occasional nausea but no emesis until recent days.  Lost 30 #.     Past Medical History  Diagnosis Date  . Stroke     assoc with short term memory loss and right peripheral vision loss; age 43  . Blood transfusion   . Anxiety   . Coronary artery disease     Apical LAD infarction '00; NSTEMI s/p BMS to prox LAD '09; Cath 12/2011 single vessel CAD w/ patent LAD stent w/ stable mild ISR, nl LV systolic fxn  . Anemia   . Antithrombin III deficiency     ?pt not sure if true diagnosis  . Tobacco abuse   . High blood pressure   . Myocardial infarction   . TIA (transient ischemic attack)     Past Surgical History  Procedure Laterality Date  . Tubal ligation    . Cardiac catheterization    . Esophagogastroduodenoscopy N/A 06/09/2012  Procedure: ESOPHAGOGASTRODUODENOSCOPY (EGD);  Surgeon: Beryle Beams, MD;  Location: [facility];  Service: Endoscopy;  Laterality: N/A;  . Coronary angioplasty      Prior to Admission medications   Medication Sig Start Date End Date Taking? Authorizing Provider  aspirin 325 MG tablet Take 325 mg by mouth every morning.   Yes Historical Provider, MD  Aspirin-Salicylamide-Caffeine (BC HEADACHE POWDER PO) Take 1 packet by mouth every 6 (six) hours as needed (pain).   Yes Historical Provider, MD  carvedilol (COREG) 6.25 MG tablet Take 1 tablet (6.25 mg total) by mouth 2 (two) times daily with a meal. 12/24/11  Yes Jessica A Hope, PA-C  clopidogrel (PLAVIX) 75 MG tablet Take 1 tablet (75 mg total) by mouth daily. 06/09/12  Yes Thurnell Lose, MD  nitroGLYCERIN (NITROSTAT) 0.4 MG SL tablet Place 1 tablet (0.4 mg total) under the tongue every 5 (five) minutes  as needed for chest pain (up to 3 doses). 12/24/11  Yes Jessica A Hope, PA-C  ondansetron (ZOFRAN-ODT) 8 MG disintegrating tablet Take 8 mg by mouth every 4 (four) hours as needed for nausea. 12m ODT q4 hours prn nausea 08/31/12  Yes JBabette Relic MD  simvastatin (ZOCOR) 20 MG tablet Take 20 mg by mouth at bedtime.   Yes Historical Provider, MD    Scheduled Meds: . pantoprazole (PROTONIX) IV  40 mg Intravenous Q12H  . sodium chloride  3 mL Intravenous Q12H   Infusions: . sodium chloride 1,000 mL (08/02/13 0832)  . heparin 1,150 Units/hr (08/02/13 0122)   PRN Meds: HYDROmorphone (DILAUDID) injection, ondansetron (ZOFRAN) IV, ondansetron   Allergies as of 08/01/2013  . (No Known Allergies)    Family History  Problem Relation Age of Onset  . Heart disease Brother     arrhythmia; died  . Breast cancer Maternal Aunt     History   Social History  . Marital Status: Divorced    Spouse Name: N/A    Number of Children: N/A  . Years of Education: N/A   Occupational History  . Not on file.   Social History Main Topics  . Smoking status: Current Some Day Smoker -- 0.20 packs/day for 1 years    Types: Cigarettes  . Smokeless tobacco: Never Used  . Alcohol Use: 0.0 oz/week    0 Cans of beer per week     Comment: occasion  . Drug Use: No  . Sexual Activity: Yes    Birth Control/ Protection: None   Other Topics Concern  . Not on file   Social History Narrative  . No narrative on file    REVIEW OF SYSTEMS: Constitutional:  No weakness until recently ENT:  No nose bleeds Pulm:  No dyspnea, no cough CV:  Some tachycardia yeasterday, now resolved. no LE edema.  GU:  No hematuria, no frequency GI:  Per HPI. Heme:  No iron in use at home   Transfusions:  Yes in 2004 several months after birth of child (vaginal delivery) for menorrhagia. Neuro:  No headaches, no peripheral tingling or numbness Derm:  No itching, no rash or sores.  Endocrine:  No sweats or chills.  No  polyuria or dysuria Immunization:  No queried Travel:  None beyond local counties in last few months.    PHYSICAL EXAM: Vital signs in last 24 hours: Filed Vitals:   08/02/13 0535  BP: 131/81  Pulse: 74  Temp: 98 F (36.7 C)  Resp: 16   Wt Readings from Last 3 Encounters:  08/02/13 76.068 kg (167 lb 11.2 oz)  10/10/12 81.647 kg (180 lb)  08/30/12 83.008 kg (183 lb)    General: looks well, comfortable, alert  Head:  No swelling or asymmetry  Eyes:  No icterus or pallor Ears:  Not HOH.  Nose:  No congestion or discharge Mouth:  Clear and moist.  No lesions, no blood Neck:  No mass, no JVD, no bruits, no TMG Lungs:  Clear bil.  Unlabored breathing, no cough Heart: RRR.  No MRG Abdomen:  Soft, ND, active BS, no mass or HSM, no bruits, no hernias.  Mild to minor tenderness in upper abdomen, > on left.  No guard or rebound. .   Rectal: deferred   Musc/Skeltl: no joint swelling or deformity Extremities:  No pedal edema.  Feet warm  Neurologic:  Oriented x 3. No limb weakness.  No tremor.  Engaged. Good historian Skin:  No telangectasia or rash Tattoos:  None seen Nodes:  No cervical adenopathy   Psych:  Pleasant, relaxed, cooperative.   Intake/Output from previous day: 05/18 0701 - 05/19 0700 In: 1000 [I.V.:1000] Out: 220 [Urine:20; Emesis/NG output:200] Intake/Output this shift:    LAB RESULTS:  Recent Labs  08/01/13 0937 08/02/13 0745  WBC 9.0 8.3  HGB 11.0* 10.1*  HCT 33.9* 30.5*  PLT 139* 139*   BMET Lab Results  Component Value Date   NA 139 08/02/2013   NA 135* 08/01/2013   NA 137 08/30/2012   K 3.9 08/02/2013   K 4.3 08/01/2013   K 3.7 08/30/2012   CL 107 08/02/2013   CL 100 08/01/2013   CL 96 08/30/2012   CO2 20 08/02/2013   CO2 15* 08/01/2013   CO2 21 08/30/2012   GLUCOSE 136* 08/02/2013   GLUCOSE 89 08/01/2013   GLUCOSE 136* 08/30/2012   BUN 5* 08/02/2013   BUN 10 08/01/2013   BUN 7 08/30/2012   CREATININE 0.57 08/02/2013   CREATININE 0.66 08/01/2013    CREATININE 0.80 08/30/2012   CALCIUM 8.8 08/02/2013   CALCIUM 8.7 08/01/2013   CALCIUM 9.8 08/30/2012   LFT  Recent Labs  08/01/13 0937 08/02/13 0745  PROT 7.5 6.6  ALBUMIN 3.3* 3.0*  AST 66* 28  ALT 42* 29  ALKPHOS 165* 138*  BILITOT 0.7 0.6   PT/INR Lab Results  Component Value Date   INR 1.04 12/23/2011   INR 0.96 06/29/2011   INR 0.90 12/23/2010   Lipase     Component Value Date/Time   LIPASE 629* 08/01/2013 0937    Drugs of Abuse     Component Value Date/Time   LABOPIA NONE DETECTED 12/23/2010 2328   COCAINSCRNUR NONE DETECTED 12/23/2010 2328   LABBENZ NONE DETECTED 12/23/2010 2328   AMPHETMU NONE DETECTED 12/23/2010 2328   THCU NONE DETECTED 12/23/2010 2328   LABBARB NONE DETECTED 12/23/2010 2328     RADIOLOGY STUDIES: Ct Abdomen Pelvis W Contrast 08/01/2013  COMPARISON:  CT of the abdomen and pelvis 06/10/2011.  FINDINGS: Lung Bases: There is a small filling defect within the apex of the left ventricle, best appreciated on images 6-8 of series 2, suspicious for a left ventricular apical thrombus. There is some myocardial thinning and low-attenuation throughout the left ventricular apex, which appears bowed slightly outward, suggesting fibrofatty remodeling related to prior left anterior descending territory myocardial infarction.  Abdomen/Pelvis: Pancreatic parenchyma enhances normally at this time. No focal pancreatic lesions. No pancreatic ductal dilatation. However, there is extensive peripancreatic stranding throughout the retroperitoneal fat, indicative of acute inflammation.  No peripancreatic fluid collections are noted at this time to suggest pseudocyst formation. The splenic vein, superior mesenteric vein, splenoportal confluence and portal veins are all patent at this time.  Diffuse low attenuation throughout the hepatic parenchyma, suggestive of hepatic steatosis (difficult to say for certain on today's contrast-enhanced CT scan). No focal hepatic lesions are noted. The  appearance of the gallbladder, spleen, bilateral adrenal glands and the right kidney is unremarkable. Sub cm low-attenuation lesion in the lower pole of the left kidney is too small to definitively characterize, but is statistically likely to represent a tiny cyst. No significant volume of ascites. No pneumoperitoneum. No pathologic distention of small bowel. No definite lymphadenopathy identified within the abdomen or pelvis. Normal appendix. Numerous colonic diverticulae are noted, particularly in the descending and sigmoid colon, without surrounding inflammatory changes to suggest an acute diverticulitis at this time. Uterus and bilateral ovaries are unremarkable in appearance. Urinary bladder is normal in appearance.  Musculoskeletal: There are no aggressive appearing lytic or blastic lesions noted in the visualized portions of the skeleton.  IMPRESSION: 1. Imaging findings are compatible with acute pancreatitis. No pancreatic pseudocyst or evidence of frank pancreatic necrosis is noted at this time. 2. Findings are highly suspicious for potential left ventricular apical thrombus (likely related to prior distal LAD territory myocardial infarction). This places the patient at high risk for systemic embolization, and consultation with Cardiology and further evaluation with echocardiography is strongly recommended in the near future. 3. Diffuse low attenuation throughout the hepatic parenchyma, suggestive of hepatic steatosis. This difficult to confirm on today's contrast enhanced CT scan. 4. Colonic diverticulosis without findings to suggest acute diverticulitis at this time. 5. Additional incidental findings, as above. These results were called by telephone at the time of interpretation on 08/01/2013 at 9:56 PM to nurse Clarene Critchley for Dr. Madilyn Fireman, who verbally acknowledged these results.   Electronically Signed   By: Vinnie Langton M.D.   On: 08/01/2013 22:00    ENDOSCOPIC STUDIES: 05/2012  EGD  Dr  Benson Norway For eval of hematemesis IMPRESSION:  1) 2 cm hiatal hernia  12/2007  Colonoscopy   Dr Ardis Hughs For intermittent rectal bleeding and IDA Diverticulosis, hemorrhoids, otherwise normal examination. She has  been hemoccult negative twice this admission. No plans for further  GI testing. tTG and IgA levels are pending (celiac sprue can cause  heme negative IDA). tTG normal and IgA 115.      IMPRESSION:   *  Acute pancreatitis, recurrent.  Previous bouts of pancreatitis attributed to ETOH. No apparent biliary disease other than fatty liver. Currently (if pt is honest) she is not drinking much.  Triglycerides not elevated  *  Hematemesis. Resolved. Hx of same, EGD 2014 with [facility]. Only using PPI PRN.   Several months of anorexia and mild nausea.    *  Left ventricular apical thrombus. On heparin gtt, will need long term oral AC.  Chronic Plavix PTA, currently on hold.   *  Fatty liver.     PLAN:     *  EGD this afternoon, d/w pt.  Will arrange with Dr Fuller Plan.     Vena Rua  08/02/2013, 10:46 AM Pager: [phone]      Attending physician's note   I have taken a history, examined the patient and reviewed the chart. I agree with the Advanced Practitioner's note, impression and recommendations. Acute, recurrent pancreatitis-previous episodes felt to be etoh related. Self limited hematemesis. EGD today as anticoagulation is planned.   Norberto Sorenson  Sindy Guadeloupe, MD [facility]

## 2013-08-02 NOTE — H&P (Signed)
INTERNAL MEDICINE TEACHING ATTENDING NOTE  Day 1 of stay  Patient name: Lindsey [name]  MRN: [id number] Date of birth: 1970/06/10   43 y.o. african [place] lady with antithrombin III deficiency, history of CVA and MI, history of pancreatitis, who has been admitted for acute pancreatitis this time, and in the process of doing a Ct abd pelv, incidental finding of filling defect in LV. She also had hematemesis several times before coming in per her report.   Today on exam, the patient says she feels better, has some nausea. Her pain abdomen is much better on dilaudid, but once the effect wears off she feels it.   Filed Vitals:   08/01/13 1500 08/01/13 1535 08/01/13 1847 08/02/13 0535  BP: 110/73  164/99 131/81  Pulse:   80 74  Temp:   97.4 F (36.3 C) 98 F (36.7 C)  TempSrc:   Oral Oral  Resp: 13  16 16   Height:  5\' 6"  (1.676 m)    Weight:  158 lb (71.668 kg)  167 lb 11.2 oz (76.068 kg)  SpO2:   99%     Physical Exam General: Resting in bed, comfortable. HEENT: PERRL, EOMI, no scleral icterus. Heart: RRR, no rubs, murmurs or gallops. Lungs: basilar rales. Abdomen: Soft, mild left middle and lower quadrant tenderness to palpation which goea all the way to the back, nondistended, BS sluggish. Extremities: Warm, no pedal edema. Neuro: Alert and oriented X3, grossly non-focal.     Recent Labs Lab 08/01/13 0937 08/02/13 0745  HGB 11.0* 10.1*  HCT 33.9* 30.5*  WBC 9.0 8.3  PLT 139* 139*    Recent Labs Lab 08/01/13 0937 08/02/13 0745  NA 135* 139  K 4.3 3.9  CL 100 107  CO2 15* 20  GLUCOSE 89 136*  BUN 10 5*  CREATININE 0.66 0.57  CALCIUM 8.7 8.8    Recent Labs Lab 08/01/13 0937 08/02/13 0745  AST 66* 28  ALT 42* 29  ALKPHOS 165* 138*  BILITOT 0.7 0.6  PROT 7.5 6.6  ALBUMIN 3.3* 3.0*    Recent Labs Lab 08/01/13 0937  LIPASE 629*   Assessment and Plan   Acute pancreatitis - Last time it was apparently due to alcohol, this time etiology remains  uncertain. For now, we will continue symptomatic treatment. Stop D5NS and start NS at 175cc/hr. Start clears and advance as patient shows improvement.    Possible blood clot in LV - On hepatin IV, we will do ECHO to confirm. cardiology consult appreciated.   Hematemesis - Use of pain killer and goody powder on a daily basis, last EGD done in 05/2012, showed hiatal hernia. GI consult for repeat EGD if needed. If the patient has no more hematemesis and HgB is stable, she can be seen as outpatient.       I have seen and evaluated this patient and discussed it with my IM resident team.  Please see the rest of the plan per resident note from today.   Lindsey [name] 08/02/2013, 11:34 AM.

## 2013-08-02 NOTE — Progress Notes (Signed)
Utilization review completed.  

## 2013-08-02 NOTE — Progress Notes (Signed)
  Echocardiogram 2D Echocardiogram has been performed.  Arvil Chaco 08/02/2013, 11:25 AM

## 2013-08-02 NOTE — Consult Note (Signed)
Primary cardiologist: Wall   HPI: 43 year old female with past medical history of coronary artery disease for evaluation of left ventricular apical thrombus. Patient has had previous PCI of her LAD. Last cardiac catheterization in October of 2013 showed normal LV function with apical akinesis/aneurysm. There was no disease in the left main. The stent in the proximal LAD had a 30-40% in-stent restenosis. There was no significant disease in the circumflex or right coronary artery. She has been treated medically. Patient has had a previous CVA. There is a question of anti-thrombin III deficiency. She typically does not have dyspnea on exertion, orthopnea, PND, pedal edema, palpitations, syncope or exertional chest pain. Admitted May 18 with abdominal pain. Patient found to have pancreatitis. Abdominal CT showed pancreatitis but there was also left ventricular apical thrombus. Cardiology asked to evaluate. Patient's abdominal pain is improving. Note on admission she was also having nausea and vomiting with blood-streaked emesis.   Medications Prior to Admission  Medication Sig Dispense Refill  . aspirin 325 MG tablet Take 325 mg by mouth every morning.      . Aspirin-Salicylamide-Caffeine (BC HEADACHE POWDER PO) Take 1 packet by mouth every 6 (six) hours as needed (pain).      . carvedilol (COREG) 6.25 MG tablet Take 1 tablet (6.25 mg total) by mouth 2 (two) times daily with a meal.  60 tablet  6  . clopidogrel (PLAVIX) 75 MG tablet Take 1 tablet (75 mg total) by mouth daily.  30 tablet  6  . nitroGLYCERIN (NITROSTAT) 0.4 MG SL tablet Place 1 tablet (0.4 mg total) under the tongue every 5 (five) minutes as needed for chest pain (up to 3 doses).  25 tablet  3  . ondansetron (ZOFRAN-ODT) 8 MG disintegrating tablet Take 8 mg by mouth every 4 (four) hours as needed for nausea. 43m ODT q4 hours prn nausea      . simvastatin (ZOCOR) 20 MG tablet Take 20 mg by mouth at bedtime.        No Known  Allergies  Past Medical History  Diagnosis Date  . Stroke     assoc with short term memory loss and right peripheral vision loss; age 43  . Blood transfusion   . Anxiety   . Coronary artery disease     Apical LAD infarction '00; NSTEMI s/p BMS to prox LAD '09; Cath 12/2011 single vessel CAD w/ patent LAD stent w/ stable mild ISR, nl LV systolic fxn  . Anemia   . Antithrombin III deficiency     ?pt not sure if true diagnosis  . Tobacco abuse   . High blood pressure   . Myocardial infarction     Past Surgical History  Procedure Laterality Date  . Tubal ligation    . Cardiac catheterization    . Esophagogastroduodenoscopy N/A 06/09/2012    Procedure: ESOPHAGOGASTRODUODENOSCOPY (EGD);  Surgeon: PBeryle Beams MD;  [facility];  Service: Endoscopy;  Laterality: N/A;  . Coronary angioplasty      History   Social History  . Marital Status: Divorced    Spouse Name: N/A    Number of Children: 2  . Years of Education: N/A   Occupational History  . Not on file.   Social History Main Topics  . Smoking status: Former Smoker -- 0.20 packs/day for 1 years    Types: Cigarettes  . Smokeless tobacco: Never Used  . Alcohol Use: 0.0 oz/week    0 Cans of beer per week  Comment: occasion  . Drug Use: No  . Sexual Activity: Yes    Birth Control/ Protection: None   Other Topics Concern  . Not on file   Social History Narrative  . No narrative on file    Family History  Problem Relation Age of Onset  . Heart disease Brother     arrhythmia; died  . Breast cancer Maternal Aunt     ROS:  Abdominal pain, nausea and vomiting but no fevers or chills, productive cough, hemoptysis, dysphasia, odynophagia, melena, hematochezia, dysuria, hematuria, rash, seizure activity, orthopnea, PND, pedal edema, claudication. Remaining systems are negative.  Physical Exam:   Blood pressure 131/81, pulse 74, temperature 98 F (36.7 C), temperature source Oral, resp. rate 16, height  '5\' 6"'  (1.676 m), weight 167 lb 11.2 oz (76.068 kg), SpO2 99.00%.  General:  Well developed/well nourished in NAD Skin warm/dry Patient not depressed No peripheral clubbing Back-normal HEENT-normal/normal eyelids Neck supple/normal carotid upstroke bilaterally; no bruits; no JVD; no thyromegaly chest - CTA/ normal expansion CV - RRR/normal S1 and S2; no murmurs, rubs or gallops;  PMI nondisplaced Abdomen -Mild diffuse tenderness/ND, no HSM, no mass, + bowel sounds, no bruit 2+ femoral pulses, no bruits Ext-no edema, chords, 2+ DP Neuro-grossly nonfocal  ECG Sinus with prior anterior infarct  Results for orders placed during the hospital encounter of 08/01/13 (from the past 48 hour(s))  URINALYSIS, ROUTINE W REFLEX MICROSCOPIC     Status: Abnormal   Collection Time    08/01/13  9:24 AM      Result Value Ref Range   Color, Urine AMBER (*) YELLOW   Comment: BIOCHEMICALS MAY BE AFFECTED BY COLOR   APPearance CLOUDY (*) CLEAR   Specific Gravity, Urine 1.027  1.005 - 1.030   pH 5.0  5.0 - 8.0   Glucose, UA NEGATIVE  NEGATIVE mg/dL   Hgb urine dipstick NEGATIVE  NEGATIVE   Bilirubin Urine MODERATE (*) NEGATIVE   Ketones, ur >80 (*) NEGATIVE mg/dL   Protein, ur 100 (*) NEGATIVE mg/dL   Urobilinogen, UA 1.0  0.0 - 1.0 mg/dL   Nitrite NEGATIVE  NEGATIVE   Leukocytes, UA NEGATIVE  NEGATIVE  PREGNANCY, URINE     Status: None   Collection Time    08/01/13  9:24 AM      Result Value Ref Range   Preg Test, Ur NEGATIVE  NEGATIVE   Comment:            THE SENSITIVITY OF THIS     METHODOLOGY IS >20 mIU/mL.  URINE MICROSCOPIC-ADD ON     Status: Abnormal   Collection Time    08/01/13  9:24 AM      Result Value Ref Range   Squamous Epithelial / LPF RARE  RARE   RBC / HPF 0-2  <3 RBC/hpf   Bacteria, UA FEW (*) RARE   Casts HYALINE CASTS (*) NEGATIVE  CBC WITH DIFFERENTIAL     Status: Abnormal   Collection Time    08/01/13  9:37 AM      Result Value Ref Range   WBC 9.0  4.0 - 10.5  K/uL   RBC 3.58 (*) 3.87 - 5.11 MIL/uL   Hemoglobin 11.0 (*) 12.0 - 15.0 g/dL   HCT 33.9 (*) 36.0 - 46.0 %   MCV 94.7  78.0 - 100.0 fL   MCH 30.7  26.0 - 34.0 pg   MCHC 32.4  30.0 - 36.0 g/dL   RDW 18.0 (*) 11.5 - 15.5 %  Platelets 139 (*) 150 - 400 K/uL   Neutrophils Relative % 83 (*) 43 - 77 %   Neutro Abs 7.5  1.7 - 7.7 K/uL   Lymphocytes Relative 9 (*) 12 - 46 %   Lymphs Abs 0.8  0.7 - 4.0 K/uL   Monocytes Relative 7  3 - 12 %   Monocytes Absolute 0.6  0.1 - 1.0 K/uL   Eosinophils Relative 1  0 - 5 %   Eosinophils Absolute 0.1  0.0 - 0.7 K/uL   Basophils Relative 0  0 - 1 %   Basophils Absolute 0.0  0.0 - 0.1 K/uL  COMPREHENSIVE METABOLIC PANEL     Status: Abnormal   Collection Time    08/01/13  9:37 AM      Result Value Ref Range   Sodium 135 (*) 137 - 147 mEq/L   Potassium 4.3  3.7 - 5.3 mEq/L   Comment: HEMOLYSIS AT THIS LEVEL MAY AFFECT RESULT   Chloride 100  96 - 112 mEq/L   CO2 15 (*) 19 - 32 mEq/L   Glucose, Bld 89  70 - 99 mg/dL   BUN 10  6 - 23 mg/dL   Creatinine, Ser 0.66  0.50 - 1.10 mg/dL   Calcium 8.7  8.4 - 10.5 mg/dL   Total Protein 7.5  6.0 - 8.3 g/dL   Albumin 3.3 (*) 3.5 - 5.2 g/dL   AST 66 (*) 0 - 37 U/L   Comment: HEMOLYSIS AT THIS LEVEL MAY AFFECT RESULT   ALT 42 (*) 0 - 35 U/L   Comment: HEMOLYSIS AT THIS LEVEL MAY AFFECT RESULT   Alkaline Phosphatase 165 (*) 39 - 117 U/L   Total Bilirubin 0.7  0.3 - 1.2 mg/dL   GFR calc non Af Amer >90  >90 mL/min   GFR calc Af Amer >90  >90 mL/min   Comment: (NOTE)     The eGFR has been calculated using the CKD EPI equation.     This calculation has not been validated in all clinical situations.     eGFR's persistently <90 mL/min signify possible Chronic Kidney     Disease.  LIPASE, BLOOD     Status: Abnormal   Collection Time    08/01/13  9:37 AM      Result Value Ref Range   Lipase 629 (*) 11 - 59 U/L  LIPID PANEL     Status: None   Collection Time    08/01/13  4:05 PM      Result Value Ref Range    Cholesterol 187  0 - 200 mg/dL   Triglycerides 131  <150 mg/dL   HDL 76  >39 mg/dL   Total CHOL/HDL Ratio 2.5     VLDL 26  0 - 40 mg/dL   LDL Cholesterol 85  0 - 99 mg/dL   Comment:            Total Cholesterol/HDL:CHD Risk     Coronary Heart Disease Risk Table                         Men   Women      1/2 Average Risk   3.4   3.3      Average Risk       5.0   4.4      2 X Average Risk   9.6   7.1      3 X Average Risk  23.4  11.0                Use the calculated Patient Ratio     above and the CHD Risk Table     to determine the patient's CHD Risk.                ATP III CLASSIFICATION (LDL):      <100     mg/dL   Optimal      100-129  mg/dL   Near or Above                        Optimal      130-159  mg/dL   Borderline      160-189  mg/dL   High      >190     mg/dL   Very High  ETHANOL     Status: None   Collection Time    08/01/13  4:05 PM      Result Value Ref Range   Alcohol, Ethyl (B) <11  0 - 11 mg/dL   Comment:            LOWEST DETECTABLE LIMIT FOR     SERUM ALCOHOL IS 11 mg/dL     FOR MEDICAL PURPOSES ONLY  TROPONIN I     Status: None   Collection Time    08/01/13  6:50 PM      Result Value Ref Range   Troponin I <0.30  <0.30 ng/mL   Comment:            Due to the release kinetics of cTnI,     a negative result within the first hours     of the onset of symptoms does not rule out     myocardial infarction with certainty.     If myocardial infarction is still suspected,     repeat the test at appropriate intervals.  LACTIC ACID, PLASMA     Status: None   Collection Time    08/01/13  8:30 PM      Result Value Ref Range   Lactic Acid, Venous 1.2  0.5 - 2.2 mmol/L  GLUCOSE, CAPILLARY     Status: Abnormal   Collection Time    08/01/13  9:46 PM      Result Value Ref Range   Glucose-Capillary 102 (*) 70 - 99 mg/dL  COMPREHENSIVE METABOLIC PANEL     Status: Abnormal   Collection Time    08/02/13  7:45 AM      Result Value Ref Range   Sodium 139  137  - 147 mEq/L   Potassium 3.9  3.7 - 5.3 mEq/L   Chloride 107  96 - 112 mEq/L   CO2 20  19 - 32 mEq/L   Glucose, Bld 136 (*) 70 - 99 mg/dL   BUN 5 (*) 6 - 23 mg/dL   Creatinine, Ser 0.57  0.50 - 1.10 mg/dL   Calcium 8.8  8.4 - 10.5 mg/dL   Total Protein 6.6  6.0 - 8.3 g/dL   Albumin 3.0 (*) 3.5 - 5.2 g/dL   AST 28  0 - 37 U/L   ALT 29  0 - 35 U/L   Alkaline Phosphatase 138 (*) 39 - 117 U/L   Total Bilirubin 0.6  0.3 - 1.2 mg/dL   GFR calc non Af Amer >90  >90 mL/min   GFR calc Af Amer >90  >90 mL/min   Comment: (NOTE)  The eGFR has been calculated using the CKD EPI equation.     This calculation has not been validated in all clinical situations.     eGFR's persistently <90 mL/min signify possible Chronic Kidney     Disease.  CBC     Status: Abnormal   Collection Time    08/02/13  7:45 AM      Result Value Ref Range   WBC 8.3  4.0 - 10.5 K/uL   RBC 3.27 (*) 3.87 - 5.11 MIL/uL   Hemoglobin 10.1 (*) 12.0 - 15.0 g/dL   HCT 30.5 (*) 36.0 - 46.0 %   MCV 93.3  78.0 - 100.0 fL   MCH 30.9  26.0 - 34.0 pg   MCHC 33.1  30.0 - 36.0 g/dL   RDW 18.0 (*) 11.5 - 15.5 %   Platelets 139 (*) 150 - 400 K/uL  HEPARIN LEVEL (UNFRACTIONATED)     Status: None   Collection Time    08/02/13  8:50 AM      Result Value Ref Range   Heparin Unfractionated 0.42  0.30 - 0.70 IU/mL   Comment:            IF HEPARIN RESULTS ARE BELOW     EXPECTED VALUES, AND PATIENT     DOSAGE HAS BEEN CONFIRMED,     SUGGEST FOLLOW UP TESTING     OF ANTITHROMBIN III LEVELS.    Ct Abdomen Pelvis W Contrast  08/01/2013   CLINICAL DATA:  Mid abdominal pain. Nausea and vomiting. Possible pancreatitis.  EXAM: CT ABDOMEN AND PELVIS WITH CONTRAST  TECHNIQUE: Multidetector CT imaging of the abdomen and pelvis was performed using the standard protocol following bolus administration of intravenous contrast.  CONTRAST:  139m OMNIPAQUE IOHEXOL 300 MG/ML  SOLN  COMPARISON:  CT of the abdomen and pelvis 06/10/2011.  FINDINGS: Lung  Bases: There is a small filling defect within the apex of the left ventricle, best appreciated on images 6-8 of series 2, suspicious for a left ventricular apical thrombus. There is some myocardial thinning and low-attenuation throughout the left ventricular apex, which appears bowed slightly outward, suggesting fibrofatty remodeling related to prior left anterior descending territory myocardial infarction.  Abdomen/Pelvis: Pancreatic parenchyma enhances normally at this time. No focal pancreatic lesions. No pancreatic ductal dilatation. However, there is extensive peripancreatic stranding throughout the retroperitoneal fat, indicative of acute inflammation. No peripancreatic fluid collections are noted at this time to suggest pseudocyst formation. The splenic vein, superior mesenteric vein, splenoportal confluence and portal veins are all patent at this time.  Diffuse low attenuation throughout the hepatic parenchyma, suggestive of hepatic steatosis (difficult to say for certain on today's contrast-enhanced CT scan). No focal hepatic lesions are noted. The appearance of the gallbladder, spleen, bilateral adrenal glands and the right kidney is unremarkable. Sub cm low-attenuation lesion in the lower pole of the left kidney is too small to definitively characterize, but is statistically likely to represent a tiny cyst. No significant volume of ascites. No pneumoperitoneum. No pathologic distention of small bowel. No definite lymphadenopathy identified within the abdomen or pelvis. Normal appendix. Numerous colonic diverticulae are noted, particularly in the descending and sigmoid colon, without surrounding inflammatory changes to suggest an acute diverticulitis at this time. Uterus and bilateral ovaries are unremarkable in appearance. Urinary bladder is normal in appearance.  Musculoskeletal: There are no aggressive appearing lytic or blastic lesions noted in the visualized portions of the skeleton.  IMPRESSION: 1.  Imaging findings are compatible with acute pancreatitis. No  pancreatic pseudocyst or evidence of frank pancreatic necrosis is noted at this time. 2. Findings are highly suspicious for potential left ventricular apical thrombus (likely related to prior distal LAD territory myocardial infarction). This places the patient at high risk for systemic embolization, and consultation with Cardiology and further evaluation with echocardiography is strongly recommended in the near future. 3. Diffuse low attenuation throughout the hepatic parenchyma, suggestive of hepatic steatosis. This difficult to confirm on today's contrast enhanced CT scan. 4. Colonic diverticulosis without findings to suggest acute diverticulitis at this time. 5. Additional incidental findings, as above. These results were called by telephone at the time of interpretation on 08/01/2013 at 9:56 PM to nurse Clarene Critchley for Dr. Madilyn Fireman, who verbally acknowledged these results.   Electronically Signed   By: Vinnie Langton M.D.   On: 08/01/2013 22:00    Assessment/Plan 1 left ventricular apical thrombus-patient has a history of apical infarct/apical aneurysm. She has been admitted with pancreatitis an abdominal CT shows left ventricular apical thrombus. This is confirmed by echocardiogram with contrast. She has a history of CVA. Given the above and also question history of anti-thrombin III deficiency she will need long-term anticoagulation with coumadin. Heparin has been initiated by primary care. There is some risk with anticoagulation at this point given pancreatitis (risk of hemorrhagic pancreatitis) and recent hematemesis. Follow exam closely as well as hemoglobin. Once it is clear she can tolerate anticoagulation would initiate Coumadin. Continue heparin until INR is greater than 2. 2 coronary artery disease-given initiation of Coumadin would discontinue aspirin and Plavix long-term. Continue statin at discharge. Resume carvedilol at  discharge. 3 pancreatitis-management per primary care.  Kirk Ruths MD 08/02/2013, 11:11 AM

## 2013-08-02 NOTE — Progress Notes (Signed)
Subjective:   Pt has no new complaints this AM.  Pt is feeling better and did not have any events overnight. VSS.  She reports 8/10 abdominal pain mainly in the LLQ.  She does not feel like hungry.   Objective:   Vital signs in last 24 hours: Filed Vitals:   08/02/13 1425 08/02/13 1430 08/02/13 1439 08/02/13 1440  BP: 129/67 104/68 100/62 100/62  Pulse: 103 85 77 76  Temp:   97.5 F (36.4 C)   TempSrc:      Resp: 23 18 15 17   Height:      Weight:      SpO2: 100% 100% 97% 99%    Weight: Filed Weights   08/01/13 0826 08/01/13 1535 08/02/13 0535  Weight: 150 lb (68.04 kg) 158 lb (71.668 kg) 167 lb 11.2 oz (76.068 kg)    Ins/Outs: No intake or output data in the 24 hours ending 08/02/13 1510  Physical Exam: Constitutional: Vital signs reviewed.  Patient is walking around the room not appearing acutely ill but reports 8/10 pain.  HEENT: [place]/AT; PERRL, EOMI, conjunctivae normal, no scleral icterus  Cardiovascular: RRR, no MRG Pulmonary/Chest: normal respiratory effort, no accessory muscle use, CTAB, no wheezes, rales, or rhonchi Abdominal: Soft. +BS, NT/ND Neurological: A&O x3, CN II_XII grossly intact; non-focal exam Extremities: 2+DP b/l, no C/C/E  Skin: Warm, dry and intact. No rash  Lab Results:  BMP:  Recent Labs Lab 08/01/13 0937 08/02/13 0745  NA 135* 139  K 4.3 3.9  CL 100 107  CO2 15* 20  GLUCOSE 89 136*  BUN 10 5*  CREATININE 0.66 0.57  CALCIUM 8.7 8.8   Anion Gap:  12  CBC:  Recent Labs Lab 08/01/13 0937 08/02/13 0745  WBC 9.0 8.3  NEUTROABS 7.5  --   HGB 11.0* 10.1*  HCT 33.9* 30.5*  MCV 94.7 93.3  PLT 139* 139*    CBG:            Recent Labs Lab 08/01/13 2146 08/02/13 1152  GLUCAP 102* 116*           HA1C:      No results found for this basename: HGBA1C,  in the last 168 hours  Lipid Panel:  Recent Labs Lab 08/01/13 1605  CHOL 187  HDL 76  LDLCALC 85  TRIG 131  CHOLHDL 2.5    LFTs:  Recent Labs Lab  08/01/13 0937 08/02/13 0745  AST 66* 28  ALT 42* 29  ALKPHOS 165* 138*  BILITOT 0.7 0.6  PROT 7.5 6.6  ALBUMIN 3.3* 3.0*    Pancreatic Enzymes:  Recent Labs Lab 08/01/13 0937  LIPASE 629*    Lactic Acid/Procalcitonin:  Recent Labs Lab 08/01/13 2030  LATICACIDVEN 1.2    Ammonia: No results found for this basename: AMMONIA,  in the last 168 hours  Cardiac Enzymes:  Recent Labs Lab 08/01/13 1850  TROPONINI <0.30    EKG: EKG Interpretation  Date/Time:  Monday Aug 01 2013 08:25:04 EDT Ventricular Rate:  91 PR Interval:  130 QRS Duration: 73 QT Interval:  368 QTC Calculation: 453 R Axis:   39 Text Interpretation:  Sinus rhythm Baseline wander in lead(s) III aVL No significant change since last tracing Confirmed by STEINL  MD, Caryn Bee ([id number]) on 08/01/2013 8:42:29 AM   BNP: No results found for this basename: PROBNP,  in the last 168 hours  D-Dimer: No results found for this basename: DDIMER,  in the last 168 hours  Urinalysis:  Recent  Labs Lab 08/01/13 0924  COLORURINE AMBER*  LABSPEC 1.027  PHURINE 5.0  GLUCOSEU NEGATIVE  HGBUR NEGATIVE  BILIRUBINUR MODERATE*  KETONESUR >80*  PROTEINUR 100*  UROBILINOGEN 1.0  NITRITE NEGATIVE  LEUKOCYTESUR NEGATIVE    Micro Results: No results found for this or any previous visit (from the past 240 hour(s)).  Blood Culture:    Component Value Date/Time   SDES URINE, CLEAN CATCH 09/25/2010 2220   SPECREQUEST NONE 09/25/2010 2220   CULT NO GROWTH 09/25/2010 2220   REPTSTATUS 09/26/2010 FINAL 09/25/2010 2220    Studies/Results: Ct Abdomen Pelvis W Contrast  08/01/2013   CLINICAL DATA:  Mid abdominal pain. Nausea and vomiting. Possible pancreatitis.  EXAM: CT ABDOMEN AND PELVIS WITH CONTRAST  TECHNIQUE: Multidetector CT imaging of the abdomen and pelvis was performed using the standard protocol following bolus administration of intravenous contrast.  CONTRAST:  OMNIPAQUE IOHEXOL 300 MG/ML  SOLN   COMPARISON:  CT of the abdomen and pelvis 06/10/2011.  FINDINGS: Lung Bases: There is a small filling defect within the apex of the left ventricle, best appreciated on images 6-8 of series 2, suspicious for a left ventricular apical thrombus. There is some myocardial thinning and low-attenuation throughout the left ventricular apex, which appears bowed slightly outward, suggesting fibrofatty remodeling related to prior left anterior descending territory myocardial infarction.  Abdomen/Pelvis: Pancreatic parenchyma enhances normally at this time. No focal pancreatic lesions. No pancreatic ductal dilatation. However, there is extensive peripancreatic stranding throughout the retroperitoneal fat, indicative of acute inflammation. No peripancreatic fluid collections are noted at this time to suggest pseudocyst formation. The splenic vein, superior mesenteric vein, splenoportal confluence and portal veins are all patent at this time.  Diffuse low attenuation throughout the hepatic parenchyma, suggestive of hepatic steatosis (difficult to say for certain on today's contrast-enhanced CT scan). No focal hepatic lesions are noted. The appearance of the gallbladder, spleen, bilateral adrenal glands and the right kidney is unremarkable. Sub cm low-attenuation lesion in the lower pole of the left kidney is too small to definitively characterize, but is statistically likely to represent a tiny cyst. No significant volume of ascites. No pneumoperitoneum. No pathologic distention of small bowel. No definite lymphadenopathy identified within the abdomen or pelvis. Normal appendix. Numerous colonic diverticulae are noted, particularly in the descending and sigmoid colon, without surrounding inflammatory changes to suggest an acute diverticulitis at this time. Uterus and bilateral ovaries are unremarkable in appearance. Urinary bladder is normal in appearance.  Musculoskeletal: There are no aggressive appearing lytic or blastic  lesions noted in the visualized portions of the skeleton.  IMPRESSION: 1. Imaging findings are compatible with acute pancreatitis. No pancreatic pseudocyst or evidence of frank pancreatic necrosis is noted at this time. 2. Findings are highly suspicious for potential left ventricular apical thrombus (likely related to prior distal LAD territory myocardial infarction). This places the patient at high risk for systemic embolization, and consultation with Cardiology and further evaluation with echocardiography is strongly recommended in the near future. 3. Diffuse low attenuation throughout the hepatic parenchyma, suggestive of hepatic steatosis. This difficult to confirm on today's contrast enhanced CT scan. 4. Colonic diverticulosis without findings to suggest acute diverticulitis at this time. 5. Additional incidental findings, as above. These results were called by telephone at the time of interpretation on 08/01/2013 at 9:56 PM to nurse Aggie Cosier for Dr. Aletta Edouard, who verbally acknowledged these results.   Electronically Signed   By: [name] M.D.   On: 08/01/2013 22:00    Medications:  Scheduled Meds: . pantoprazole (PROTONIX) IV  40 mg Intravenous Q12H  . sodium chloride  3 mL Intravenous Q12H   Continuous Infusions: . sodium chloride 1,000 mL (08/02/13 0832)  . sodium chloride    . heparin 1,150 Units/hr (08/02/13 0122)   PRN Meds: HYDROmorphone (DILAUDID) injection, ondansetron (ZOFRAN) IV, ondansetron  Antibiotics: Antibiotics Given (last 72 hours)   None      Day of Hospitalization: 1  Consults: Treatment Team:  Rounding Lbcardiology, MD  Assessment/Plan:   Principal Problem:   Pancreatitis, acute Active Problems:   HYPERLIPIDEMIA-MIXED   HYPERTENSION   Pancreatitis   Apical mural thrombus   Hematemesis  Acute pancreatitis Pt reports her pain is still 8/10 but appears comfortable.  She presented with LLQ abdominal pain with N/V. Pain likely related to acute  pancreatitis.  Lipase >3x ULN. Acidosis likely due to ketoacidosis since ketonuria (pt reports decreased po intake). Lactic acid wnl.  Trops x 1 neg.  LP wnl.  CT Abd/pelvis: imaging findings are c/w acute pancreatitis. No pancreatic pseudocyst or evidence of frank necrosis noted. Findings are suspicious for potential LV apical thrombus (likely related to prior distal LAD territory MI). Pt is at high risk for systemic embolization, and consultation with cardiology and further  evaluation with echocardiography is strongly recommended. Diffuse low attenuation throughout the hepatic parenchyma,  suggestive of hepatic steatosis which is difficult to confirm on today's scan. Colonic diverticulosis without findings to suggest acute  diverticulitis at this time.  -NPO  -d/c D5 NS 13650ml/h  -NS 17375ml/h -dilaudid 1mg  q4h   Hematemesis  No episodes today.  Hgb stable. EGD 06/09/12: 2cm hiatal hernia.  Consulted GI and will repeat EGD today.  -PPI IV  h/o CAD  s/p anterior STEMI in 2000 with PCI of LAD; s/p PTCA and bare-metal stent to the LAD in 2009. s/p cath on May 03, 2009, showing no obstructive disease with LVEF of 55%.  -continue ASA, plavix (when po)  Hypertension  Stable.  -hold home meds while npo  Elevated transaminases  Resolved.   h/o CVA/TIA  Trops x 1 neg.   -continue ASA 325mg , plavix 75mg , simvastatin 20mg  qhs (when po)  Dyslipidemia  Pt is on simvastatin 20mg  at home.  Lab Results   Component  Value  Date    LDLCALC  85  08/01/2013   -continue statin therapy when able to take po (pt should be on high intensity statin given CVA and cardiac history).   FEN  Fluids-15875ml/h NS  Electrolytes-  Nutrition- NPO   VTE prophylaxis  heparin gtt   Disposition Disposition is deferred, awaiting improvement of current medical problems.  Anticipated discharge in approximately 1-2 day(s).     LOS: 1 day   Marrian SalvageJacquelyn S Divine Imber, MD PGY-1, Internal Medicine Teaching  Service [phone] (7AM-5PM Mon-Fri) 08/02/2013, 3:10 PM

## 2013-08-02 NOTE — Interval H&P Note (Signed)
History and Physical Interval Note:  08/02/2013 1:44 PM  Lindsey [name]  has presented today for surgery, with the diagnosis of hematemesis  The various methods of treatment have been discussed with the patient and family. After consideration of risks, benefits and other options for treatment, the patient has consented to  Procedure(s): ESOPHAGOGASTRODUODENOSCOPY (EGD) (N/A) as a surgical intervention .  The patient's history has been reviewed, patient examined, no change in status, stable for surgery.  I have reviewed the patient's chart and labs.  Questions were answered to the patient's satisfaction.     Meryl Dare MD

## 2013-08-03 ENCOUNTER — Encounter (HOSPITAL_COMMUNITY): Payer: Self-pay | Admitting: Gastroenterology

## 2013-08-03 DIAGNOSIS — K5790 Diverticulosis of intestine, part unspecified, without perforation or abscess without bleeding: Secondary | ICD-10-CM | POA: Diagnosis present

## 2013-08-03 DIAGNOSIS — K209 Esophagitis, unspecified without bleeding: Secondary | ICD-10-CM | POA: Diagnosis present

## 2013-08-03 DIAGNOSIS — K449 Diaphragmatic hernia without obstruction or gangrene: Secondary | ICD-10-CM | POA: Diagnosis present

## 2013-08-03 DIAGNOSIS — K760 Fatty (change of) liver, not elsewhere classified: Secondary | ICD-10-CM | POA: Diagnosis present

## 2013-08-03 DIAGNOSIS — K2990 Gastroduodenitis, unspecified, without bleeding: Secondary | ICD-10-CM | POA: Diagnosis present

## 2013-08-03 DIAGNOSIS — K922 Gastrointestinal hemorrhage, unspecified: Secondary | ICD-10-CM

## 2013-08-03 DIAGNOSIS — D6859 Other primary thrombophilia: Secondary | ICD-10-CM

## 2013-08-03 LAB — BASIC METABOLIC PANEL
BUN: 3 mg/dL — ABNORMAL LOW (ref 6–23)
CHLORIDE: 108 meq/L (ref 96–112)
CO2: 21 mEq/L (ref 19–32)
CREATININE: 0.6 mg/dL (ref 0.50–1.10)
Calcium: 8.8 mg/dL (ref 8.4–10.5)
GFR calc non Af Amer: >90 mL/min (ref >90)
Glucose, Bld: 98 mg/dL (ref 70–99)
Potassium: 3.9 mEq/L (ref 3.7–5.3)
Sodium: 138 mEq/L (ref 137–147)

## 2013-08-03 LAB — CBC
HEMATOCRIT: 27.3 % — AB (ref 36.0–46.0)
Hemoglobin: 8.8 g/dL — ABNORMAL LOW (ref 12.0–15.0)
MCH: 30.4 pg (ref 26.0–34.0)
MCHC: 32.2 g/dL (ref 30.0–36.0)
MCV: 94.5 fL (ref 78.0–100.0)
Platelets: 127 10*3/uL — ABNORMAL LOW (ref 150–400)
RBC: 2.89 MIL/uL — ABNORMAL LOW (ref 3.87–5.11)
RDW: 18.1 % — AB (ref 11.5–15.5)
WBC: 6.7 10*3/uL (ref 4.0–10.5)

## 2013-08-03 LAB — RAPID URINE DRUG SCREEN, HOSP PERFORMED
Amphetamines: NOT DETECTED
Barbiturates: NOT DETECTED
Benzodiazepines: POSITIVE — AB
Cocaine: NOT DETECTED
Opiates: POSITIVE — AB
Tetrahydrocannabinol: NOT DETECTED

## 2013-08-03 LAB — GLUCOSE, CAPILLARY
Glucose-Capillary: 124 mg/dL — ABNORMAL HIGH (ref 70–99)
Glucose-Capillary: 97 mg/dL (ref 70–99)
Glucose-Capillary: 99 mg/dL (ref 70–99)

## 2013-08-03 LAB — GAMMA GT: GGT: 146 U/L — AB (ref 7–51)

## 2013-08-03 LAB — HEPARIN LEVEL (UNFRACTIONATED): HEPARIN UNFRACTIONATED: 0.43 [IU]/mL (ref 0.30–0.70)

## 2013-08-03 MED ORDER — HYDROMORPHONE HCL PF 1 MG/ML IJ SOLN: 0.5000 mg | INTRAMUSCULAR | Status: DC | PRN

## 2013-08-03 MED ORDER — HYDROCODONE-ACETAMINOPHEN 5-325 MG PO TABS
1.0000 | ORAL_TABLET | ORAL | Status: DC | PRN
  Administered 2013-08-03 – 2013-08-04 (×3): 1 via ORAL
  Filled 2013-08-03 (×3): qty 1

## 2013-08-03 MED ORDER — PANTOPRAZOLE SODIUM 40 MG PO TBEC
40.0000 mg | DELAYED_RELEASE_TABLET | Freq: Two times a day (BID) | ORAL | Status: DC
  Administered 2013-08-03 – 2013-08-05 (×5): 40 mg via ORAL
  Filled 2013-08-03 (×5): qty 1

## 2013-08-03 MED ORDER — SENNOSIDES-DOCUSATE SODIUM 8.6-50 MG PO TABS
2.0000 | ORAL_TABLET | Freq: Two times a day (BID) | ORAL | Status: DC
  Administered 2013-08-03 – 2013-08-04 (×3): 2 via ORAL
  Administered 2013-08-05: 1 via ORAL
  Filled 2013-08-03 (×4): qty 2

## 2013-08-03 MED ORDER — HYDROMORPHONE HCL PF 1 MG/ML IJ SOLN
0.5000 mg | INTRAMUSCULAR | Status: DC | PRN
  Administered 2013-08-03 (×2): 0.5 mg via INTRAVENOUS
  Filled 2013-08-03 (×2): qty 1

## 2013-08-03 NOTE — Progress Notes (Signed)
I repeated the critical or key portions of the exam.  I confirmed/revised the medical student's history, exam, assessment and plan.   

## 2013-08-03 NOTE — Progress Notes (Signed)
ANTICOAGULATION CONSULT NOTE - Initial Consult  Pharmacy Consult for Heparin Indication: apical thrombus   No Known Allergies  Patient Measurements: Height: 5\' 6"  (167.6 cm) Weight: 175 lb 4.8 oz (79.516 kg) (STANDING SCALE VP NT) IBW/kg (Calculated) : 59.3  Vital Signs: Temp: 98.2 F (36.8 C) (05/20 0455) Temp src: Oral (05/20 0455) BP: 102/54 mmHg (05/20 0455) Pulse Rate: 71 (05/20 0455)  Labs:  Recent Labs  08/01/13 0937 08/01/13 1850 08/02/13 0745 08/02/13 0850 08/02/13 1530 08/02/13 2105 08/03/13 0722 08/03/13 0835  HGB 11.0*  --  10.1*  --   --  9.9* 8.8*  --   HCT 33.9*  --  30.5*  --   --  30.2* 27.3*  --   PLT 139*  --  139*  --   --  165 127*  --   HEPARINUNFRC  --   --   --  0.42 0.33  --  0.43  --   CREATININE 0.66  --  0.57  --   --   --   --  0.60  TROPONINI  --  <0.30  --   --   --   --   --   --     Estimated Creatinine Clearance: 95.5 ml/min (by C-G formula based on Cr of 0.6).  Assessment: 43 yo female with apical thrombus on IV heparin. Heparin level (0.43) is therapeutic this morning on 1250 units/hr. Hgb 8.8, plt 127 K. No bleeding noted per chart. Pt. will need long-term anticoagulation.  Goal of Therapy:  Heparin level 0.3-0.7 units/ml Monitor platelets by anticoagulation protocol: Yes   Plan:  Continue heparin infusion 1250 units/hr Daily Heparin level and CBC F/u Plan for starting long-term anticoagulation.  Bayard Hugger, PharmD, BCPS  Clinical Pharmacist  Pager: [phone]   08/03/2013,10:29 AM

## 2013-08-03 NOTE — Progress Notes (Signed)
Pt's complaining of pain not relieved from oral pain medication. Pt stated spasms have decreased, but "pain is not going away". MD notified.

## 2013-08-03 NOTE — Progress Notes (Signed)
Subjective:   VSS.  She is still reporting 8/10 pain but is not consistent with clinical picture.  She continues to deny alcohol use.  She was able to tolerate clears overnight and plans are to advance diet today.    Objective:   Vital signs in last 24 hours: Filed Vitals:   08/02/13 1510 08/02/13 1532 08/02/13 2212 08/03/13 0455  BP: 101/68 120/87 100/66 102/54  Pulse: 69 63 62 71  Temp:  97.5 F (36.4 C) 98.4 F (36.9 C) 98.2 F (36.8 C)  TempSrc:  Oral Oral Oral  Resp: 12 20 18 20   Height:      Weight:    175 lb 4.8 oz (79.516 kg)  SpO2: 95% 100% 100% 100%    Weight: Filed Weights   08/01/13 1535 08/02/13 0535 08/03/13 0455  Weight: 158 lb (71.668 kg) 167 lb 11.2 oz (76.068 kg) 175 lb 4.8 oz (79.516 kg)    Ins/Outs:  Intake/Output Summary (Last 24 hours) at 08/03/13 1150 Last data filed at 08/03/13 1000  Gross per 24 hour  Intake 5588.25 ml  Output      0 ml  Net 5588.25 ml    Physical Exam: Constitutional: Vital signs reviewed.  Patient does not appearing acutely ill but reports 8/10 pain.  HEENT: [place]/AT; PERRL, EOMI, conjunctivae normal, no scleral icterus  Cardiovascular: RRR, no MRG Pulmonary/Chest: normal respiratory effort, no accessory muscle use, CTAB, no wheezes, rales, or rhonchi Abdominal: Soft. +BS, mild TTP in LLQ Neurological: A&O x3, CN II-XII grossly intact; non-focal exam Extremities: 2+DP b/l, no C/C/E  Skin: Warm, dry and intact. No rash  Lab Results:  BMP:  Recent Labs Lab 08/02/13 0745 08/03/13 0835  NA 139 138  K 3.9 3.9  CL 107 108  CO2 20 21  GLUCOSE 136* 98  BUN 5* 3*  CREATININE 0.57 0.60  CALCIUM 8.8 8.8   Anion Gap:  9  CBC:  Recent Labs Lab 08/01/13 0937  08/02/13 2105 08/03/13 0722  WBC 9.0  < > 7.5 6.7  NEUTROABS 7.5  --   --   --   HGB 11.0*  < > 9.9* 8.8*  HCT 33.9*  < > 30.2* 27.3*  MCV 94.7  < > 93.2 94.5  PLT 139*  < > 165 127*  < > = values in this interval not  displayed.  CBG:            Recent Labs Lab 08/02/13 1152 08/02/13 1708 08/02/13 1929 08/03/13 0010 08/03/13 0402 08/03/13 0836  GLUCAP 116* 111* 103* 124* 99 97           HA1C:       Recent Labs Lab 08/02/13 0745  HGBA1C 5.2    Lipid Panel:  Recent Labs Lab 08/01/13 1605  CHOL 187  HDL 76  LDLCALC 85  TRIG 131  CHOLHDL 2.5    LFTs:  Recent Labs Lab 08/01/13 0937 08/02/13 0745  AST 66* 28  ALT 42* 29  ALKPHOS 165* 138*  BILITOT 0.7 0.6  PROT 7.5 6.6  ALBUMIN 3.3* 3.0*    Pancreatic Enzymes:  Recent Labs Lab 08/01/13 0937  LIPASE 629*    Lactic Acid/Procalcitonin:  Recent Labs Lab 08/01/13 2030  LATICACIDVEN 1.2    Ammonia: No results found for this basename: AMMONIA,  in the last 168 hours  Cardiac Enzymes:  Recent Labs Lab 08/01/13 1850  TROPONINI <0.30    EKG: EKG Interpretation  Date/Time:  Monday Aug 01 2013 08:25:04 EDT  Ventricular Rate:  91 PR Interval:  130 QRS Duration: 73 QT Interval:  368 QTC Calculation: 453 R Axis:   39 Text Interpretation:  Sinus rhythm Baseline wander in lead(s) III aVL No significant change since last tracing Confirmed by Denton LankSTEINL  MD, Caryn BeeKEVIN ([id number]) on 08/01/2013 8:42:29 AM   BNP: No results found for this basename: PROBNP,  in the last 168 hours  D-Dimer: No results found for this basename: DDIMER,  in the last 168 hours  Urinalysis:  Recent Labs Lab 08/01/13 0924  COLORURINE AMBER*  LABSPEC 1.027  PHURINE 5.0  GLUCOSEU NEGATIVE  HGBUR NEGATIVE  BILIRUBINUR MODERATE*  KETONESUR >80*  PROTEINUR 100*  UROBILINOGEN 1.0  NITRITE NEGATIVE  LEUKOCYTESUR NEGATIVE    Micro Results: No results found for this or any previous visit (from the past 240 hour(s)).  Blood Culture:    Component Value Date/Time   SDES URINE, CLEAN CATCH 09/25/2010 2220   SPECREQUEST NONE 09/25/2010 2220   CULT NO GROWTH 09/25/2010 2220   REPTSTATUS 09/26/2010 FINAL 09/25/2010 2220     Studies/Results: Ct Abdomen Pelvis W Contrast  08/01/2013   CLINICAL DATA:  Mid abdominal pain. Nausea and vomiting. Possible pancreatitis.  EXAM: CT ABDOMEN AND PELVIS WITH CONTRAST  TECHNIQUE: Multidetector CT imaging of the abdomen and pelvis was performed using the standard protocol following bolus administration of intravenous contrast.  CONTRAST:  100mL OMNIPAQUE IOHEXOL 300 MG/ML  SOLN  COMPARISON:  CT of the abdomen and pelvis 06/10/2011.  FINDINGS: Lung Bases: There is a small filling defect within the apex of the left ventricle, best appreciated on images 6-8 of series 2, suspicious for a left ventricular apical thrombus. There is some myocardial thinning and low-attenuation throughout the left ventricular apex, which appears bowed slightly outward, suggesting fibrofatty remodeling related to prior left anterior descending territory myocardial infarction.  Abdomen/Pelvis: Pancreatic parenchyma enhances normally at this time. No focal pancreatic lesions. No pancreatic ductal dilatation. However, there is extensive peripancreatic stranding throughout the retroperitoneal fat, indicative of acute inflammation. No peripancreatic fluid collections are noted at this time to suggest pseudocyst formation. The splenic vein, superior mesenteric vein, splenoportal confluence and portal veins are all patent at this time.  Diffuse low attenuation throughout the hepatic parenchyma, suggestive of hepatic steatosis (difficult to say for certain on today's contrast-enhanced CT scan). No focal hepatic lesions are noted. The appearance of the gallbladder, spleen, bilateral adrenal glands and the right kidney is unremarkable. Sub cm low-attenuation lesion in the lower pole of the left kidney is too small to definitively characterize, but is statistically likely to represent a tiny cyst. No significant volume of ascites. No pneumoperitoneum. No pathologic distention of small bowel. No definite lymphadenopathy identified  within the abdomen or pelvis. Normal appendix. Numerous colonic diverticulae are noted, particularly in the descending and sigmoid colon, without surrounding inflammatory changes to suggest an acute diverticulitis at this time. Uterus and bilateral ovaries are unremarkable in appearance. Urinary bladder is normal in appearance.  Musculoskeletal: There are no aggressive appearing lytic or blastic lesions noted in the visualized portions of the skeleton.  IMPRESSION: 1. Imaging findings are compatible with acute pancreatitis. No pancreatic pseudocyst or evidence of frank pancreatic necrosis is noted at this time. 2. Findings are highly suspicious for potential left ventricular apical thrombus (likely related to prior distal LAD territory myocardial infarction). This places the patient at high risk for systemic embolization, and consultation with Cardiology and further evaluation with echocardiography is strongly recommended in the near future. 3. Diffuse  low attenuation throughout the hepatic parenchyma, suggestive of hepatic steatosis. This difficult to confirm on today's contrast enhanced CT scan. 4. Colonic diverticulosis without findings to suggest acute diverticulitis at this time. 5. Additional incidental findings, as above. These results were called by telephone at the time of interpretation on 08/01/2013 at 9:56 PM to nurse Aggie Cosier for Dr. Aletta Edouard, who verbally acknowledged these results.   Electronically Signed   By: [name] M.D.   On: 08/01/2013 22:00    Medications:  Scheduled Meds: . pantoprazole  40 mg Oral BID  . sodium chloride  3 mL Intravenous Q12H   Continuous Infusions: . sodium chloride 150 mL/hr at 08/03/13 0951  . sodium chloride 200 mL (08/02/13 1704)  . heparin 1,250 Units/hr (08/02/13 2033)   PRN Meds: HYDROmorphone (DILAUDID) injection, ondansetron (ZOFRAN) IV, ondansetron  Antibiotics: Antibiotics Given (last 72 hours)   None      Day of  Hospitalization: 2  Consults: Treatment Team:  Rounding Lbcardiology, MD  Assessment/Plan:   Principal Problem:   Pancreatitis, acute Active Problems:   HYPERLIPIDEMIA-MIXED   HYPERTENSION   Pancreatitis   Apical mural thrombus   Hematemesis  Acute pancreatitis Pt doing better and tolerating clears.  No further N/V but still with 8/10 abdominal pain.  -advance to full liquids -NS 127ml/h -change pain meds to po vicodin   Hematemesis  No episodes today.  Hgb stable. EGD 06/09/12: 2cm hiatal hernia.  Consulted GI and EGD shows esophagitis, erosive gastritis, and duodenitis with small hiatal hernia again noted.  -PPI IV bid (continue po for 4 weeks bid, then qam long term) -avoid NSAIDS, ETOH -monitor for bleeding in next 4-6 wks  h/o CAD  s/p anterior STEMI in 2000 with PCI of LAD; s/p PTCA and bare-metal stent to the LAD in 2009. s/p cath on May 03, 2009, showing no obstructive disease with LVEF of 55%.  -continue ASA, plavix (when po)  Hypertension  Stable.  -hold home meds while npo  Elevated transaminases  Resolved.   -check GGT in setting of elevated ALP  h/o CVA/TIA  Trops x 1 neg.   -continue ASA 325mg , plavix 75mg , simvastatin 20mg  qhs (when po)  Dyslipidemia  Pt is on simvastatin 20mg  at home.  Lab Results   Component  Value  Date    LDLCALC  85  08/01/2013   -continue statin therapy when able to take po (pt should be on high intensity statin given CVA and cardiac history).   FEN  Fluids-124ml/h NS  Electrolytes-  Nutrition-advance to clears  VTE prophylaxis  heparin gtt   Disposition Disposition is deferred, awaiting improvement of current medical problems.  Anticipated discharge in approximately 1-2 day(s).     LOS: 2 days   Marrian Salvage, MD PGY-1, Internal Medicine Teaching Service [phone] (7AM-5PM Mon-Fri) 08/03/2013, 11:50 AM

## 2013-08-03 NOTE — Progress Notes (Signed)
Patient Profile: 43 year old female with a past medical history of coronary artery disease (s/p PCI to LAD with f/u cath in 2013 demonstrating 30-40% IRS, treated medically), CVA as well as questionable anti-thrombin III deficiency, admitted 08/01/13 with abdominal pain. Patient found to have pancreatitis. Abdominal CT showed pancreatitis but there was also left ventricular apical thrombus. F/U 2D echo with contrast confirmed this finding, demonstrating a small, 1.0 cm (L) x 0.5 cm (W), apicalthrombus.  Subjective: Still with moderate abdominal pain, radiating to her back. She denies CP.   Objective: Vital signs in last 24 hours: Temp:  [97.5 F (36.4 C)-98.4 F (36.9 C)] 98.2 F (36.8 C) (05/20 0455) Pulse Rate:  [61-126] 71 (05/20 0455) Resp:  [11-55] 20 (05/20 0455) BP: (90-156)/(51-108) 102/54 mmHg (05/20 0455) SpO2:  [95 %-100 %] 100 % (05/20 0455) Weight:  [175 lb 4.8 oz (79.516 kg)] 175 lb 4.8 oz (79.516 kg) (05/20 0455) Last BM Date: 08/02/13  Intake/Output from previous day: 05/19 0701 - 05/20 0700 In: 4649.7 [P.O.:718; I.V.:3931.7] Out: -  Intake/Output this shift:    Medications Current Facility-Administered Medications  Medication Dose Route Frequency Provider Last Rate Last Dose  . 0.9 %  sodium chloride infusion   Intravenous Continuous Annett Gula, MD 150 mL/hr at 08/03/13 [phone]    . 0.9 %  sodium chloride infusion   Intravenous Continuous Dianah Field, PA-C 20 mL/hr at 08/02/13 1704 200 mL at 08/02/13 1704  . heparin ADULT infusion 100 units/mL (25000 units/250 mL)  1,250 Units/hr Intravenous Continuous Aletta Edouard, MD 12.5 mL/hr at 08/02/13 2033 1,250 Units/hr at 08/02/13 2033  . HYDROmorphone (DILAUDID) injection 1 mg  1 mg Intravenous Q4H PRN Annett Gula, MD   1 mg at 08/03/13 0953  . ondansetron (ZOFRAN) tablet 4 mg  4 mg Oral Q6H PRN Annett Gula, MD       Or  . ondansetron [facility]) injection 4 mg  4 mg Intravenous Q6H PRN Annett Gula, MD   4  mg at 08/03/13 0548  . pantoprazole (PROTONIX) EC tablet 40 mg  40 mg Oral BID Aletta Edouard, MD   40 mg at 08/03/13 0950  . sodium chloride 0.9 % injection 3 mL  3 mL Intravenous Q12H Annett Gula, MD        PE: General appearance: alert, cooperative and no distress Lungs: clear to auscultation bilaterally Heart: regular rate and rhythm, S1, S2 normal, no murmur, click, rub or gallop Extremities: no LEE Pulses: 2+ and symmetric Skin: warm and dry Neurologic: Grossly normal  Lab Results:   Recent Labs  08/02/13 0745 08/02/13 2105 08/03/13 0722  WBC 8.3 7.5 6.7  HGB 10.1* 9.9* 8.8*  HCT 30.5* 30.2* 27.3*  PLT 139* 165 127*   BMET  Recent Labs  08/01/13 0937 08/02/13 0745 08/03/13 0835  NA 135* 139 138  K 4.3 3.9 3.9  CL 100 107 108  CO2 15* 20 21  GLUCOSE 89 136* 98  BUN 10 5* 3*  CREATININE 0.66 0.57 0.60  CALCIUM 8.7 8.8 8.8   PT/INR No results found for this basename: LABPROT, INR,  in the last 72 hours Cholesterol  Recent Labs  08/01/13 1605  CHOL 187     Studies/Results:  2D echo 08/02/13 Study Conclusions  - Left ventricle: The cavity size was normal. Systolic function was normal. The estimated ejection fraction was in the range of 55% to 60%. There is akinesis of the apical myocardium. Doppler parameters are consistent with  abnormal left ventricular relaxation (grade 1 diastolic dysfunction). Acoustic contrast opacification revealed an apparent, small, 1.0 cm (L) x 0.5 cm (W), apicalthrombus.   Assessment/Plan  Principal Problem:   Pancreatitis, acute Active Problems:   HYPERLIPIDEMIA-MIXED   HYPERTENSION   Pancreatitis   Apical mural thrombus   Hematemesis  1. Left Ventricular Apical Thrombus - 2D echocardiogram with contrast confirmed findings of CT. She has a history of CVA as well as a history of apical infarct/apical aneurysm. There is also question of history of anti-thrombin III deficiency. Based on the above, she will need  long-term anticoagulation with Coumadin. Heparin has been initiated by primary care, however Hgb is trending downward (11-->10.1-->9.9 -->8.8). Continue to monitor closely.  Wait until pancreatis has resolved and Hgb stablized before initiating Coumadin to avoid risk of hemorrhagic pancreatitis/ worsening anemia. Once stable, bridge with heparin until INR >2.   2. Coronary Artery Disease- Denies chest pain. Given initiation of Coumadin, would discontinue aspirin and Plavix long-term. Continue statin at discharge. Resume carvedilol at discharge.   3. Pancreatitis-management per primary care.     LOS: 2 days    Brittainy M. Delmer IslamSimmons, PA-C 08/03/2013 10:25 AM  Agree with note written by Boyce MediciBrittany Simmons  [facility]  Pt admitted for pancreatitis. Apical mural LV thrombus detected by CT and subsequently by 2D with contrast. Agree with IV hep to coumadin AC once pancreatitis resolves and she is able to take PO. Hgb trending down. Will need to follow closely. Agree with DC DAPT in setting of oral AC.  Runell GessJonathan J Pearlie Nies 08/03/2013 1:43 PM

## 2013-08-03 NOTE — H&P (Signed)
I repeated the critical or key portions of the exam.  I confirmed/revised the medical student's history, exam, assessment and plan.   

## 2013-08-03 NOTE — Progress Notes (Signed)
Subjective: She still C/O abdominal pain 8/10, not consistant with clinical observation. Tolerating her liquid diet well. C/O mild nausea last night for which she took Zofran and she was fine since then. Objective: Vital signs in last 24 hours: Filed Vitals:   08/02/13 1510 08/02/13 1532 08/02/13 2212 08/03/13 0455  BP: 101/68 120/87 100/66 102/54  Pulse: 69 63 62 71  Temp:  97.5 F (36.4 C) 98.4 F (36.9 C) 98.2 F (36.8 C)  TempSrc:  Oral Oral Oral  Resp: 12 20 18 20   Height:      Weight:    79.516 kg (175 lb 4.8 oz)  SpO2: 95% 100% 100% 100%   Weight change: 11.476 kg (25 lb 4.8 oz)  Intake/Output Summary (Last 24 hours) at 08/03/13 1308 Last data filed at 08/03/13 1000  Gross per 24 hour  Intake 5588.25 ml  Output      0 ml  Net 5588.25 ml   EXAM: Patient was alert, comfortable, not in any distress. HEENT: AT/[place], perrl, EOMI NECK: Supple, no adenopathy, no JVD. LUNGS: Clear B/L, no added sound HEART: RRR< S1 and S2 NL, no MRG. ABDOMEN: soft, non distended, mildly tender LUQ and LLQ. No guarding or rebound. No masses. BS +ve  EXT: no edema, cyanosis. PP 2+ B/L NEURO: grossly intact Lab Results: @LABTEST2 @ Micro Results: No results found for this or any previous visit (from the past 240 hour(s)). Studies/Results: Ct Abdomen Pelvis W Contrast  08/01/2013   CLINICAL DATA:  Mid abdominal pain. Nausea and vomiting. Possible pancreatitis.  EXAM: CT ABDOMEN AND PELVIS WITH CONTRAST  TECHNIQUE: Multidetector CT imaging of the abdomen and pelvis was performed using the standard protocol following bolus administration of intravenous contrast.  CONTRAST:  OMNIPAQUE IOHEXOL 300 MG/ML  SOLN  COMPARISON:  CT of the abdomen and pelvis 06/10/2011.  FINDINGS: Lung Bases: There is a small filling defect within the apex of the left ventricle, best appreciated on images 6-8 of series 2, suspicious for a left ventricular apical thrombus. There is some myocardial thinning and  low-attenuation throughout the left ventricular apex, which appears bowed slightly outward, suggesting fibrofatty remodeling related to prior left anterior descending territory myocardial infarction.  Abdomen/Pelvis: Pancreatic parenchyma enhances normally at this time. No focal pancreatic lesions. No pancreatic ductal dilatation. However, there is extensive peripancreatic stranding throughout the retroperitoneal fat, indicative of acute inflammation. No peripancreatic fluid collections are noted at this time to suggest pseudocyst formation. The splenic vein, superior mesenteric vein, splenoportal confluence and portal veins are all patent at this time.  Diffuse low attenuation throughout the hepatic parenchyma, suggestive of hepatic steatosis (difficult to say for certain on today's contrast-enhanced CT scan). No focal hepatic lesions are noted. The appearance of the gallbladder, spleen, bilateral adrenal glands and the right kidney is unremarkable. Sub cm low-attenuation lesion in the lower pole of the left kidney is too small to definitively characterize, but is statistically likely to represent a tiny cyst. No significant volume of ascites. No pneumoperitoneum. No pathologic distention of small bowel. No definite lymphadenopathy identified within the abdomen or pelvis. Normal appendix. Numerous colonic diverticulae are noted, particularly in the descending and sigmoid colon, without surrounding inflammatory changes to suggest an acute diverticulitis at this time. Uterus and bilateral ovaries are unremarkable in appearance. Urinary bladder is normal in appearance.  Musculoskeletal: There are no aggressive appearing lytic or blastic lesions noted in the visualized portions of the skeleton.  IMPRESSION: 1. Imaging findings are compatible with acute pancreatitis. No pancreatic  pseudocyst or evidence of frank pancreatic necrosis is noted at this time. 2. Findings are highly suspicious for potential left ventricular  apical thrombus (likely related to prior distal LAD territory myocardial infarction). This places the patient at high risk for systemic embolization, and consultation with Cardiology and further evaluation with echocardiography is strongly recommended in the near future. 3. Diffuse low attenuation throughout the hepatic parenchyma, suggestive of hepatic steatosis. This difficult to confirm on today's contrast enhanced CT scan. 4. Colonic diverticulosis without findings to suggest acute diverticulitis at this time. 5. Additional incidental findings, as above. These results were called by telephone at the time of interpretation on 08/01/2013 at 9:56 PM to nurse Aggie Cosierheresa for Dr. Aletta EdouardSHILPA BHARDWAJ, who verbally acknowledged these results.   Electronically Signed   By: Trudie Reedaniel  Entrikin M.D.   On: 08/01/2013 22:00   Medications: medication reviewed Scheduled Meds: . pantoprazole  40 mg Oral BID  . sodium chloride  3 mL Intravenous Q12H   Continuous Infusions: . sodium chloride 150 mL/hr at 08/03/13 0951  . sodium chloride 200 mL (08/02/13 1704)  . heparin 1,250 Units/hr (08/02/13 2033)   PRN Meds:.HYDROcodone-acetaminophen, ondansetron Assessment/Plan: ACUTE PANCREATITIS:Still C/O left sided abd. Pain, no N/V, tolerating liquid diet well and sayning that her appetite is back today. Can advance her diet , Decrease her fluid to 13425ml/hr. Decreased her dilauded dose to 0.5 and monitor her response. HEMATEMESIS: No more incidence of hematemesis, continue with oral PPI for long term as advised by GI due to her gastric erosion on EGD. APICAL MURAL THROMBUS: On heparin drip, should start her on coumadin once her pancreatitis resolved. Her Hg is keep dropping, looks more like dilutional effect as she had more then 4L since yesterday and all of her counts are down trending, recheck CBC tomorrow and decrease her fluid to 14825ml/hr. HTN: Her BP is little on lower limit, not to start her home meds. And keep  monitoring. HYPERLIPIDEMIA: Continue with her statin once pancreatitis resolved.  ANTITHROMBIN III DEFICIENCY: She needs long term anticoagulation  This is a Psychologist, occupationalMedical Student Note.  The care of the patient was discussed with Dr. Shirlee LatchMcLean and the assessment and plan formulated with their assistance.  Please see their attached note for official documentation of the daily encounter.   LOS: 2 days   Arnetha CourserSumayya Kmya Placide, Med Student 08/03/2013, 1:08 PM

## 2013-08-03 NOTE — Progress Notes (Signed)
1435 Pt's IV infiltrated and IV fluids stopped until new IV access regained. New IV placed and heparin and NS Iv fluids started back up at 1534.

## 2013-08-04 DIAGNOSIS — I219 Acute myocardial infarction, unspecified: Secondary | ICD-10-CM

## 2013-08-04 LAB — CBC WITH DIFFERENTIAL/PLATELET
BASOS ABS: 0 10*3/uL (ref 0.0–0.1)
BASOS PCT: 0 % (ref 0–1)
Eosinophils Absolute: 0.1 10*3/uL (ref 0.0–0.7)
Eosinophils Relative: 1 % (ref 0–5)
HCT: 27 % — ABNORMAL LOW (ref 36.0–46.0)
HEMOGLOBIN: 8.6 g/dL — AB (ref 12.0–15.0)
Lymphocytes Relative: 27 % (ref 12–46)
Lymphs Abs: 1.3 10*3/uL (ref 0.7–4.0)
MCH: 30.5 pg (ref 26.0–34.0)
MCHC: 31.9 g/dL (ref 30.0–36.0)
MCV: 95.7 fL (ref 78.0–100.0)
Monocytes Absolute: 0.4 10*3/uL (ref 0.1–1.0)
Monocytes Relative: 8 % (ref 3–12)
NEUTROS PCT: 64 % (ref 43–77)
Neutro Abs: 2.9 10*3/uL (ref 1.7–7.7)
Platelets: 135 10*3/uL — ABNORMAL LOW (ref 150–400)
RBC: 2.82 MIL/uL — ABNORMAL LOW (ref 3.87–5.11)
RDW: 18.4 % — AB (ref 11.5–15.5)
WBC: 4.6 10*3/uL (ref 4.0–10.5)

## 2013-08-04 LAB — HEPARIN LEVEL (UNFRACTIONATED): Heparin Unfractionated: 0.22 IU/mL — ABNORMAL LOW (ref 0.30–0.70)

## 2013-08-04 LAB — CBC
HCT: 26 % — ABNORMAL LOW (ref 36.0–46.0)
Hemoglobin: 8.3 g/dL — ABNORMAL LOW (ref 12.0–15.0)
MCH: 30.5 pg (ref 26.0–34.0)
MCHC: 31.9 g/dL (ref 30.0–36.0)
MCV: 95.6 fL (ref 78.0–100.0)
PLATELETS: 132 10*3/uL — AB (ref 150–400)
RBC: 2.72 MIL/uL — ABNORMAL LOW (ref 3.87–5.11)
RDW: 18.7 % — ABNORMAL HIGH (ref 11.5–15.5)
WBC: 5.3 10*3/uL (ref 4.0–10.5)

## 2013-08-04 MED ORDER — ENOXAPARIN SODIUM 100 MG/ML ~~LOC~~ SOLN
85.0000 mg | Freq: Two times a day (BID) | SUBCUTANEOUS | Status: DC
  Administered 2013-08-04 – 2013-08-05 (×2): 85 mg via SUBCUTANEOUS
  Filled 2013-08-04 (×4): qty 1

## 2013-08-04 MED ORDER — COUMADIN BOOK
Freq: Once | Status: AC
  Administered 2013-08-04: 15:00:00
  Filled 2013-08-04: qty 1

## 2013-08-04 MED ORDER — WARFARIN VIDEO: Freq: Once | Status: DC

## 2013-08-04 MED ORDER — HYDROCODONE-ACETAMINOPHEN 5-325 MG PO TABS
1.0000 | ORAL_TABLET | ORAL | Status: DC | PRN
  Administered 2013-08-04 – 2013-08-05 (×6): 2 via ORAL
  Filled 2013-08-04 (×6): qty 2

## 2013-08-04 MED ORDER — WARFARIN - PHARMACIST DOSING INPATIENT: Freq: Every day | Status: DC

## 2013-08-04 MED ORDER — WARFARIN SODIUM 10 MG PO TABS
10.0000 mg | ORAL_TABLET | Freq: Once | ORAL | Status: AC
  Administered 2013-08-04: 10 mg via ORAL
  Filled 2013-08-04: qty 1

## 2013-08-04 NOTE — Progress Notes (Signed)
  Subjective: Patient was feeling better, tolerating regular diet,pain has improved. Objective: Vital signs in last 24 hours: Filed Vitals:   08/03/13 1332 08/03/13 2108 08/04/13 0536 08/04/13 0544  BP: 116/89 119/84 120/85   Pulse: 63 63 61   Temp: 97.9 F (36.6 C) 97.8 F (36.6 C) 98.7 F (37.1 C)   TempSrc: Oral Oral Oral   Resp: 19 20 20    Height:      Weight:    83.099 kg (183 lb 3.2 oz)  SpO2: 100% 97% 97%    Weight change: 3.583 kg (7 lb 14.4 oz)  Intake/Output Summary (Last 24 hours) at 08/04/13 1350 Last data filed at 08/04/13 0100  Gross per 24 hour  Intake    682 ml  Output      0 ml  Net    682 ml   EXAM: Patient was alert, comfortable, not in any distress.  HEENT: AT/[place], perrl, EOMI  NECK: Supple, no adenopathy, no JVD.  LUNGS: Clear B/L, no added sound  HEART: RRR< S1 and S2 NL, no MRG.  ABDOMEN: soft, non distended, mildly tender LUQ and LLQ. No guarding or rebound. No masses. BS +ve  EXT: no edema, cyanosis. PP 2+ B/L  NEURO: grossly intact  Lab Results: @LABTEST2 @ Micro Results: No results found for this or any previous visit (from the past 240 hour(s)). Studies/Results: No results found. Medications: medication reviewed Scheduled Meds: . coumadin book   Does not apply Once  . pantoprazole  40 mg Oral BID  . senna-docusate  2 tablet Oral BID  . sodium chloride  3 mL Intravenous Q12H  . warfarin  10 mg Oral ONCE-1800  . warfarin   Does not apply Once  . Warfarin - Pharmacist Dosing Inpatient   Does not apply q1800   Continuous Infusions: . sodium chloride 125 mL/hr at 08/04/13 1020  . sodium chloride 200 mL (08/02/13 1704)  . heparin 1,350 Units/hr (08/04/13 0824)   PRN Meds:.HYDROcodone-acetaminophen, ondansetron Assessment/Plan:  ACUTE PANCREATITIS:Still C/O left sided abd. Pain, although much improved then before, no N/V, tolerating regular diet well .  Her fluid can bo decreased further, she is on oral pain meds. And having a good  response.May be discharge once Lovenox and coumadin started. HEMATEMESIS: No more incidence of hematemesis, continue with oral PPI for long term as advised by GI due to her gastric erosion on EGD.  APICAL MURAL THROMBUS: On heparin drip, should start her on coumadin and levenox today after pharmacy consult. Her Hg is keep dropping, looks more like dilutional effect and all of her counts are down trending, recheck CBC tomorrow and decrease her fluid   HTN: Her BP is little on lower limit, not to start her home meds. And keep monitoring.  HYPERLIPIDEMIA: Continue with her statin once pancreatitis resolved.  ANTITHROMBIN III DEFICIENCY: She needs long term anticoagulation  This is a Psychologist, occupational Note.  The care of the patient was discussed with Dr. Shirlee Latch and the assessment and plan formulated with their assistance.  Please see their attached note for official documentation of the daily encounter.   LOS: 3 days   Arnetha Courser, Med Student 08/04/2013, 1:50 PM

## 2013-08-04 NOTE — Progress Notes (Signed)
ANTICOAGULATION CONSULT NOTE   Pharmacy Consult for Heparin>>lovenox Indication: apical thrombus   No Known Allergies  Patient Measurements: Height: 5\' 6"  (167.6 cm) Weight: 183 lb 3.2 oz (83.099 kg) IBW/kg (Calculated) : 59.3  Vital Signs: Temp: 98.7 F (37.1 C) (05/21 0536) Temp src: Oral (05/21 0536) BP: 120/85 mmHg (05/21 0536) Pulse Rate: 61 (05/21 0536)  Labs:  Recent Labs  08/01/13 1850 08/02/13 0745  08/02/13 1530  08/03/13 0722 08/03/13 0835 08/04/13 0535 08/04/13 1235  HGB  --  10.1*  --   --   < > 8.8*  --  8.3* 8.6*  HCT  --  30.5*  --   --   < > 27.3*  --  26.0* 27.0*  PLT  --  139*  --   --   < > 127*  --  132* 135*  HEPARINUNFRC  --   --   < > 0.33  --  0.43  --  0.22*  --   CREATININE  --  0.57  --   --   --   --  0.60  --   --   TROPONINI <0.30  --   --   --   --   --   --   --   --   < > = values in this interval not displayed.  Estimated Creatinine Clearance: 97.5 ml/min (by C-G formula based on Cr of 0.6).  Assessment: 43 yo female with apical thrombus on IV heparin to change to lovenox today  No bleeding noted per chart. Pt. will need long-term anticoagulation, coumadin to start today as well Patient will need at least 5 days of overlap lovenox and coumadin and therpeutic INR before lovenox can be dced.  Goal of Therapy:  INR 2-3 Monitor platelets by anticoagulation protocol: Yes   Plan:  DC heparin and 1 hour later start lovenox 85 mg (1 mg/kg) sq q12 hours Coumadin 10 mg po today Daily PT/INR Monitor for bleeding  Talbert Cage, PharmD, Clinical Pharmacist  Pager: [phone]   08/04/2013,2:04 PM

## 2013-08-04 NOTE — Progress Notes (Signed)
Subjective:   VSS.  She is still reporting abdominal pain 5/10 that has improved since yesterday.  She is able to tolerate a regular diet now.     Objective:   Vital signs in last 24 hours: Filed Vitals:   08/03/13 2108 08/04/13 0536 08/04/13 0544 08/04/13 1532  BP: 119/84 120/85  129/93  Pulse: 63 61  64  Temp: 97.8 F (36.6 C) 98.7 F (37.1 C)  98.5 F (36.9 C)  TempSrc: Oral Oral  Oral  Resp: 20 20  20   Height:      Weight:   183 lb 3.2 oz (83.099 kg)   SpO2: 97% 97%  99%    Weight: Filed Weights   08/02/13 0535 08/03/13 0455 08/04/13 0544  Weight: 167 lb 11.2 oz (76.068 kg) 175 lb 4.8 oz (79.516 kg) 183 lb 3.2 oz (83.099 kg)    Ins/Outs:  Intake/Output Summary (Last 24 hours) at 08/04/13 1641 Last data filed at 08/04/13 0100  Gross per 24 hour  Intake    460 ml  Output      0 ml  Net    460 ml    Physical Exam: Constitutional: Vital signs reviewed.  Patient does not appearing acutely ill. HEENT: [place]/AT; PERRL, EOMI, conjunctivae normal, no scleral icterus  Cardiovascular: RRR, no MRG Pulmonary/Chest: normal respiratory effort, no accessory muscle use, CTAB, no wheezes, rales, or rhonchi Abdominal: Soft. +BS, mild TTP in epigastric>LLQ Neurological: A&O x3, CN II-XII grossly intact; non-focal exam Extremities: 2+DP b/l, no C/C/E  Skin: Warm, dry and intact. No rash  Lab Results:  BMP:  Recent Labs Lab 08/02/13 0745 08/03/13 0835  NA 139 138  K 3.9 3.9  CL 107 108  CO2 20 21  GLUCOSE 136* 98  BUN 5* 3*  CREATININE 0.57 0.60  CALCIUM 8.8 8.8   Anion Gap:  9  CBC:  Recent Labs Lab 08/01/13 0937  08/04/13 0535 08/04/13 1235  WBC 9.0  < > 5.3 4.6  NEUTROABS 7.5  --   --  2.9  HGB 11.0*  < > 8.3* 8.6*  HCT 33.9*  < > 26.0* 27.0*  MCV 94.7  < > 95.6 95.7  PLT 139*  < > 132* 135*  < > = values in this interval not displayed.  CBG:            Recent Labs Lab 08/02/13 1152 08/02/13 1708 08/02/13 1929 08/03/13 0010 08/03/13 0402  08/03/13 0836  GLUCAP 116* 111* 103* 124* 99 97           HA1C:       Recent Labs Lab 08/02/13 0745  HGBA1C 5.2    Lipid Panel:  Recent Labs Lab 08/01/13 1605  CHOL 187  HDL 76  LDLCALC 85  TRIG 131  CHOLHDL 2.5    LFTs:  Recent Labs Lab 08/01/13 0937 08/02/13 0745  AST 66* 28  ALT 42* 29  ALKPHOS 165* 138*  BILITOT 0.7 0.6  PROT 7.5 6.6  ALBUMIN 3.3* 3.0*    Pancreatic Enzymes:  Recent Labs Lab 08/01/13 0937  LIPASE 629*    Lactic Acid/Procalcitonin:  Recent Labs Lab 08/01/13 2030  LATICACIDVEN 1.2    Ammonia: No results found for this basename: AMMONIA,  in the last 168 hours  Cardiac Enzymes:  Recent Labs Lab 08/01/13 1850  TROPONINI <0.30    EKG: EKG Interpretation  Date/Time:  Monday Aug 01 2013 08:25:04 EDT Ventricular Rate:  91 PR Interval:  130 QRS Duration: 73  QT Interval:  368 QTC Calculation: 453 R Axis:   39 Text Interpretation:  Sinus rhythm Baseline wander in lead(s) III aVL No significant change since last tracing Confirmed by Denton Lank  MD, Caryn Bee ([id number]) on 08/01/2013 8:42:29 AM   BNP: No results found for this basename: PROBNP,  in the last 168 hours  D-Dimer: No results found for this basename: DDIMER,  in the last 168 hours  Urinalysis:  Recent Labs Lab 08/01/13 0924  COLORURINE AMBER*  LABSPEC 1.027  PHURINE 5.0  GLUCOSEU NEGATIVE  HGBUR NEGATIVE  BILIRUBINUR MODERATE*  KETONESUR >80*  PROTEINUR 100*  UROBILINOGEN 1.0  NITRITE NEGATIVE  LEUKOCYTESUR NEGATIVE    Micro Results: No results found for this or any previous visit (from the past 240 hour(s)).  Blood Culture:    Component Value Date/Time   SDES URINE, CLEAN CATCH 09/25/2010 2220   SPECREQUEST NONE 09/25/2010 2220   CULT NO GROWTH 09/25/2010 2220   REPTSTATUS 09/26/2010 FINAL 09/25/2010 2220    Studies/Results: No results found.  Medications:  Scheduled Meds: . enoxaparin (LOVENOX) injection  85 mg Subcutaneous Q12H  .  pantoprazole  40 mg Oral BID  . senna-docusate  2 tablet Oral BID  . sodium chloride  3 mL Intravenous Q12H  . warfarin  10 mg Oral ONCE-1800  . warfarin   Does not apply Once  . Warfarin - Pharmacist Dosing Inpatient   Does not apply q1800   Continuous Infusions: . sodium chloride 20 mL/hr at 08/04/13 1545   PRN Meds: HYDROcodone-acetaminophen, ondansetron  Antibiotics: Antibiotics Given (last 72 hours)   None      Day of Hospitalization: 3  Consults:    Assessment/Plan:   Principal Problem:   Pancreatitis, acute Active Problems:   HYPERLIPIDEMIA-MIXED   HYPERTENSION   Antithrombin III deficiency   Pancreatitis   Apical mural thrombus   Hematemesis   Gastritis and duodenitis   Diverticulosis   Fatty liver   Esophagitis   Hiatal hernia  Acute pancreatitis Pt doing better and tolerating regular diet.  No further N/V but still with 5/10 abdominal pain.  -d/c IVF -po vicodin   Hematemesis  No episodes today; hgb stable even though has trended down.  EGD 06/09/12: 2cm hiatal hernia.  Consulted GI and EGD shows esophagitis, erosive gastritis, and duodenitis with small hiatal hernia again noted.  -PPI po bid (continue po for 4 weeks bid, then qam long term) -avoid NSAIDS, ETOH -monitor for bleeding in next 4-6 wks  Left ventricular thrombus Cards following- patient on heparin gtt with transition to lovenox-->coumadin.  -lovenox, coumadin per pharmacy   h/o CAD  s/p anterior STEMI in 2000 with PCI of LAD; s/p PTCA and bare-metal stent to the LAD in 2009. s/p cath on May 03, 2009, showing no obstructive disease with LVEF of 55%.  -d/c ASA, plavix  Hypertension  Stable.  -hold home meds while npo  Elevated transaminases  Resolved.    h/o CVA/TIA  Trops x 1 neg.   -d/c ASA 325mg , plavix 75mg  -continue statin   Dyslipidemia  Pt is on simvastatin 20mg  at home.  Lab Results   Component  Value  Date    LDLCALC  85  08/01/2013   -continue statin    FEN  Fluids-None  Electrolytes-  Nutrition-regular diet  VTE prophylaxis  heparin gtt-->lovenox-->coumadin  Disposition Disposition is deferred, awaiting improvement of current medical problems.  Anticipated discharge tomorrow.     LOS: 3 days   Marrian Salvage, MD PGY-1, Internal  Medicine Teaching Service [phone] (7AM-5PM Mon-Fri) 08/04/2013, 4:41 PM

## 2013-08-04 NOTE — Progress Notes (Signed)
ANTICOAGULATION CONSULT NOTE   Pharmacy Consult for Heparin/Coumadin Indication: apical thrombus   No Known Allergies  Patient Measurements: Height: 5\' 6"  (167.6 cm) Weight: 183 lb 3.2 oz (83.099 kg) IBW/kg (Calculated) : 59.3  Vital Signs: Temp: 98.7 F (37.1 C) (05/21 0536) Temp src: Oral (05/21 0536) BP: 120/85 mmHg (05/21 0536) Pulse Rate: 61 (05/21 0536)  Labs:  Recent Labs  08/01/13 1607 08/01/13 1850 08/02/13 0745  08/02/13 1530 08/02/13 2105 08/03/13 0722 08/03/13 0835 08/04/13 0535  HGB 11.0*  --  10.1*  --   --  9.9* 8.8*  --  8.3*  HCT 33.9*  --  30.5*  --   --  30.2* 27.3*  --  26.0*  PLT 139*  --  139*  --   --  165 127*  --  132*  HEPARINUNFRC  --   --   --   < > 0.33  --  0.43  --  0.22*  CREATININE 0.66  --  0.57  --   --   --   --  0.60  --   TROPONINI  --  <0.30  --   --   --   --   --   --   --   < > = values in this interval not displayed.  Estimated Creatinine Clearance: 97.5 ml/min (by C-G formula based on Cr of 0.6).  Assessment: 43 yo female with apical thrombus on IV heparin. Heparin level (0.22) is subtherapeutic this morning on 1250 units/hr.  No bleeding noted per chart. Pt. will need long-term anticoagulation, coumadin to start today  Goal of Therapy:  INR 2-3 Heparin level 0.3-0.7 units/ml Monitor platelets by anticoagulation protocol: Yes   Plan:  Increase heparin infusion to 1350 units/hr Check heparin level 6 hours after rate increase Coumadin 10 mg po today Daily Heparin level, CBC and PT/INR  Talbert Cage, PharmD, Clinical Pharmacist  Pager: [phone]   08/04/2013,8:00 AM

## 2013-08-04 NOTE — Progress Notes (Signed)
Patient Name: Lindsey PasturesLareese W Mcclish Date of Encounter: 08/04/2013  Principal Problem:   Pancreatitis, acute Active Problems:   HYPERLIPIDEMIA-MIXED   HYPERTENSION   Antithrombin III deficiency   Pancreatitis   Apical mural thrombus   Hematemesis   Gastritis and duodenitis   Diverticulosis   Fatty liver   Esophagitis   Hiatal hernia    Patient Profile: 43 year old female with a past medical history of coronary artery disease (s/p PCI to LAD with f/u cath in 2013 demonstrating 30-40% IRS, treated medically), CVA as well as questionable anti-thrombin III deficiency, admitted 08/01/13 with abdominal pain. Patient found to have pancreatitis. Abdominal CT showed pancreatitis but there was also left ventricular apical thrombus. F/U 2D echo with contrast confirmed this finding, demonstrating a small, 1.0 cm (L) x 0.5 cm (W), apicalthrombus.   SUBJECTIVE: Tolerating solid food today, feels much better. Thinks she could do lovenox, understands the importance of compliance w/ meds.  OBJECTIVE Filed Vitals:   08/03/13 1332 08/03/13 2108 08/04/13 0536 08/04/13 0544  BP: 116/89 119/84 120/85   Pulse: 63 63 61   Temp: 97.9 F (36.6 C) 97.8 F (36.6 C) 98.7 F (37.1 C)   TempSrc: Oral Oral Oral   Resp: 19 20 20    Height:      Weight:    183 lb 3.2 oz (83.099 kg)  SpO2: 100% 97% 97%     Intake/Output Summary (Last 24 hours) at 08/04/13 1328 Last data filed at 08/04/13 0100  Gross per 24 hour  Intake    682 ml  Output      0 ml  Net    682 ml   Filed Weights   08/02/13 0535 08/03/13 0455 08/04/13 0544  Weight: 167 lb 11.2 oz (76.068 kg) 175 lb 4.8 oz (79.516 kg) 183 lb 3.2 oz (83.099 kg)    PHYSICAL EXAM General: Well developed, well nourished, female in no acute distress. Head: Normocephalic, atraumatic.  Neck: Supple without bruits, JVD not elevated. Lungs:  Resp regular and unlabored, CTA. Heart: RRR, S1, S2, no S3, S4, or murmur; no rub. Abdomen: Soft, non-tender,  non-distended, BS + x 4.  Extremities: No clubbing, cyanosis, no edema.  Neuro: Alert and oriented X 3. Moves all extremities spontaneously. Psych: Normal affect.  LABS: CBC: Recent Labs  08/04/13 0535 08/04/13 1235  WBC 5.3 4.6  NEUTROABS  --  2.9  HGB 8.3* 8.6*  HCT 26.0* 27.0*  MCV 95.6 95.7  PLT 132* 135*   Basic Metabolic Panel: Recent Labs  08/02/13 0745 08/03/13 0835  NA 139 138  K 3.9 3.9  CL 107 108  CO2 20 21  GLUCOSE 136* 98  BUN 5* 3*  CREATININE 0.57 0.60  CALCIUM 8.8 8.8   Liver Function Tests: Recent Labs  08/02/13 0745  AST 28  ALT 29  ALKPHOS 138*  BILITOT 0.6  PROT 6.6  ALBUMIN 3.0*   Cardiac Enzymes: Recent Labs  08/01/13 1850  TROPONINI <0.30   BNP: Pro B Natriuretic peptide (BNP)  Date/Time Value Ref Range Status  12/23/2011  4:27 PM 38.6  0 - 125 pg/mL Final   Hemoglobin A1C: Recent Labs  08/02/13 0745  HGBA1C 5.2   Fasting Lipid Panel: Recent Labs  08/01/13 1605  CHOL 187  HDL 76  LDLCALC 85  TRIG 131  CHOLHDL 2.5   TELE:  SR, no significant ectopy  Current Medications:  . coumadin book   Does not apply Once  . pantoprazole  40 mg  Oral BID  . senna-docusate  2 tablet Oral BID  . sodium chloride  3 mL Intravenous Q12H  . warfarin  10 mg Oral ONCE-1800  . warfarin   Does not apply Once  . Warfarin - Pharmacist Dosing Inpatient   Does not apply q1800   . sodium chloride 125 mL/hr at 08/04/13 1020  . sodium chloride 200 mL (08/02/13 1704)  . heparin 1,350 Units/hr (08/04/13 0824)    ASSESSMENT AND PLAN: Principal Problem:   Pancreatitis, acute - improved, per IM/GI  Active Problems:   HYPERLIPIDEMIA-MIXED - per IM, resume statin when possible    HYPERTENSION - good control, per IM    Antithrombin III deficiency - Heparin/lovenox--> coumadin    Apical mural thrombus - need to make sure Lovenox is approved for this, o/w heparin to coumadin.    Hematemesis - per IM/GI    Gastritis and  duodenitis/Diverticulosis/Fatty liver/Esophagitis/Hiatal hernia - Per IM/GI    Hx CAD - no acute ischemic symptoms.  Plan - d/c when OK from GI/IM standpoint. As long as anticoagulation is arranged appropriately, no further cardiac eval needed.   Signed, Joline Salt Barrett , PA-C 1:28 PM 08/04/2013   Agree with note by Theodore Demark PA-C  Will need IV hep to coumadin AC once able to take PO. INR target > 2.0. D/C DAPT  . Will s/o. Call if we can be of further assistance.  Runell Gess, M.D., FACP, [facility], [facility] [place] [address] [place], [place]  [address]  [phone] 08/04/2013 2:53 PM

## 2013-08-04 NOTE — Discharge Instructions (Addendum)
Please keep your follow-up appointments; this is very important for your continued recovery.    We have made the following changes/additions to your medications:  Per pharmacy, please take lovenox every 12 hours beginning today in the evening.  This has been sent to your pharmacy.   Also, you must begin taking coumadin this evening.  Take 10mg  (2 tablets) today 5/22, 10mg  on 5/23, 7.5mg  (1.5 tablets) on 5/24, 7.5mg  on 5/25, 5mg  (1 tablet) on 5/26.  This has been sent to your pharmacy. Please have your INR checked on Wednesday, May 27 with Dr. Alexandria LodgeGroce.    Please continue to take all of your medications as prescribed.  Do not miss any doses without contacting your primary physician.  If you have questions, please contact your physician.    Please bring your medicications with you to your appointments; medicications may be eye drops, herbals, vitamins, or pills.    If you believe you are having a life-threatening emergency, [facility] the [facility].      Information on my medicine - Coumadin   (Warfarin)  This medication education was reviewed with me or my healthcare representative as part of my discharge preparation.  The pharmacist that spoke with me during my hospital stay was:  Woodfin GanjaLora Poteet Seay, [facility]  Why was Coumadin prescribed for you? Coumadin was prescribed for you because you have a blood clot or a medical condition that can cause an increased risk of forming blood clots. Blood clots can cause serious health problems by blocking the flow of blood to the heart, lung, or brain. Coumadin can prevent harmful blood clots from forming. As a reminder your indication for Coumadin is:   Select from menu  What test will check on my response to Coumadin? While on Coumadin (warfarin) you will need to have an INR test regularly to ensure that your dose is keeping you in the desired range. The INR (international normalized ratio) number is calculated from the result of the laboratory  test called prothrombin time (PT).  If an INR APPOINTMENT HAS NOT ALREADY BEEN MADE FOR YOU please schedule an appointment to have this lab work done by your health care provider within 7 days. Your INR goal is usually a number between:  2 to 3 or your provider may give you a more narrow range like 2-2.5.  Ask your health care provider during an office visit what your goal INR is.  What  do you need to  know  About  COUMADIN? Take Coumadin (warfarin) exactly as prescribed by your healthcare provider about the same time each day.  DO NOT stop taking without talking to the doctor who prescribed the medication.  Stopping without other blood clot prevention medication to take the place of Coumadin may increase your risk of developing a new clot or stroke.  Get refills before you run out.  What do you do if you miss a dose? If you miss a dose, take it as soon as you remember on the same day then continue your regularly scheduled regimen the next day.  Do not take two doses of Coumadin at the same time.  Important Safety Information A possible side effect of Coumadin (Warfarin) is an increased risk of bleeding. You should call your healthcare provider right away if you experience any of the following:   Bleeding from an injury or your nose that does not stop.   Unusual colored urine (red or dark brown) or unusual colored stools (red or black).  Unusual bruising for unknown reasons.   A serious fall or if you hit your head (even if there is no bleeding).  Some foods or medicines interact with Coumadin (warfarin) and might alter your response to warfarin. To help avoid this:   Eat a balanced diet, maintaining a consistent amount of Vitamin K.   Notify your provider about major diet changes you plan to make.   Avoid alcohol or limit your intake to 1 drink for women and 2 drinks for men per day. (1 drink is 5 oz. wine, 12 oz. beer, or 1.5 oz. liquor.)  Make sure that ANY health care provider who  prescribes medication for you knows that you are taking Coumadin (warfarin).  Also make sure the healthcare provider who is monitoring your Coumadin knows when you have started a new medication including herbals and non-prescription products.  Coumadin (Warfarin)  Major Drug Interactions  Increased Warfarin Effect Decreased Warfarin Effect  Alcohol (large quantities) Antibiotics (esp. Septra/Bactrim, Flagyl, Cipro) Amiodarone (Cordarone) Aspirin (ASA) Cimetidine (Tagamet) Megestrol (Megace) NSAIDs (ibuprofen, naproxen, etc.) Piroxicam (Feldene) Propafenone (Rythmol SR) Propranolol (Inderal) Isoniazid (INH) Posaconazole (Noxafil) Barbiturates (Phenobarbital) Carbamazepine (Tegretol) Chlordiazepoxide (Librium) Cholestyramine (Questran) Griseofulvin Oral Contraceptives Rifampin Sucralfate (Carafate) Vitamin K   Coumadin (Warfarin) Major Herbal Interactions  Increased Warfarin Effect Decreased Warfarin Effect  Garlic Ginseng Ginkgo biloba Coenzyme Q10 Green tea St. Johns wort    Coumadin (Warfarin) FOOD Interactions  Eat a consistent number of servings per week of foods HIGH in Vitamin K (1 serving =  cup)  Collards (cooked, or boiled & drained) Kale (cooked, or boiled & drained) Mustard greens (cooked, or boiled & drained) Parsley *serving size only =  cup Spinach (cooked, or boiled & drained) Swiss chard (cooked, or boiled & drained) Turnip greens (cooked, or boiled & drained)  Eat a consistent number of servings per week of foods MEDIUM-HIGH in Vitamin K (1 serving = 1 cup)  Asparagus (cooked, or boiled & drained) Broccoli (cooked, boiled & drained, or raw & chopped) Brussel sprouts (cooked, or boiled & drained) *serving size only =  cup Lettuce, raw (green leaf, endive, romaine) Spinach, raw Turnip greens, raw & chopped   These websites have more information on Coumadin (warfarin):  http://www.king-russell.com/; https://www.hines.net/;

## 2013-08-05 DIAGNOSIS — I749 Embolism and thrombosis of unspecified artery: Secondary | ICD-10-CM

## 2013-08-05 LAB — CBC
HCT: 26.4 % — ABNORMAL LOW (ref 36.0–46.0)
Hemoglobin: 8.6 g/dL — ABNORMAL LOW (ref 12.0–15.0)
MCH: 30.8 pg (ref 26.0–34.0)
MCHC: 32.6 g/dL (ref 30.0–36.0)
MCV: 94.6 fL (ref 78.0–100.0)
PLATELETS: 156 10*3/uL (ref 150–400)
RBC: 2.79 MIL/uL — ABNORMAL LOW (ref 3.87–5.11)
RDW: 18.6 % — AB (ref 11.5–15.5)
WBC: 5.6 10*3/uL (ref 4.0–10.5)

## 2013-08-05 LAB — PROTIME-INR
INR: 0.98 (ref 0.00–1.49)
Prothrombin Time: 12.8 seconds (ref 11.6–15.2)

## 2013-08-05 MED ORDER — HYDROCODONE-ACETAMINOPHEN 5-325 MG PO TABS: 1.0000 | ORAL_TABLET | Freq: Four times a day (QID) | ORAL | Status: DC | PRN

## 2013-08-05 MED ORDER — WARFARIN SODIUM 10 MG PO TABS
10.0000 mg | ORAL_TABLET | Freq: Once | ORAL | Status: AC
  Administered 2013-08-05: 10 mg via ORAL
  Filled 2013-08-05: qty 1

## 2013-08-05 MED ORDER — ENOXAPARIN SODIUM 80 MG/0.8ML ~~LOC~~ SOLN: 80.0000 mg | Freq: Two times a day (BID) | SUBCUTANEOUS | Status: DC

## 2013-08-05 MED ORDER — WARFARIN SODIUM 5 MG PO TABS: 10.0000 mg | ORAL_TABLET | Freq: Once | ORAL | Status: DC

## 2013-08-05 MED ORDER — ENOXAPARIN SODIUM 80 MG/0.8ML ~~LOC~~ SOLN
80.0000 mg | Freq: Two times a day (BID) | SUBCUTANEOUS | Status: DC
  Administered 2013-08-05: 80 mg via SUBCUTANEOUS
  Filled 2013-08-05 (×2): qty 0.8

## 2013-08-05 MED ORDER — PANTOPRAZOLE SODIUM 40 MG PO TBEC: 40.0000 mg | DELAYED_RELEASE_TABLET | Freq: Two times a day (BID) | ORAL | Status: DC

## 2013-08-05 NOTE — Progress Notes (Signed)
CARE MANAGEMENT NOTE 08/05/2013  Patient:  Lindsey [name], Lindsey [name]   Account Number:  [id number]  Date Initiated:  08/05/2013  Documentation initiated by:  [facility]  Subjective/Objective Assessment:   Acute Pancreatis     Action/Plan:   Anticipated DC Date:  08/05/2013   Anticipated DC Plan:  HOME/SELF CARE      DC Planning Services  CM consult  Medication Assistance      Choice offered to / List presented to:             Status of service:  Completed, signed off Medicare Important Message given?   (If response is "NO", the following Medicare IM given date fields will be blank) Date Medicare IM given:   Date Additional Medicare IM given:    Discharge Disposition:  HOME/SELF CARE  Per UR Regulation:  Reviewed for med. necessity/level of care/duration of stay  If discussed at Long Length of Stay Meetings, dates discussed:    Comments:  08/05/2013 1530 NCM contacted [facility] and Lovenox needs prior auth. Notified attending MD for completion of prior auth. [facility] does have medications in stock. Isidoro Donning RN CM Case Mgmt phone [phone]

## 2013-08-05 NOTE — Progress Notes (Addendum)
ANTICOAGULATION CONSULT NOTE - Follow Up Consult  Pharmacy Consult for Lovenox and Coumadin Indication: apical thrombus  No Known Allergies  Patient Measurements: Height: 5\' 6"  (167.6 cm) Weight: 182 lb 15.7 oz (83 kg) IBW/kg (Calculated) : 59.3  Vital Signs: Temp: 98.3 F (36.8 C) (05/22 0421) Temp src: Oral (05/22 0421) BP: 144/94 mmHg (05/22 0421) Pulse Rate: 69 (05/22 0421)  Labs:  Recent Labs  08/02/13 1530  08/03/13 0722 08/03/13 0835 08/04/13 0535 08/04/13 1235 08/05/13 0706  HGB  --   < > 8.8*  --  8.3* 8.6* 8.6*  HCT  --   < > 27.3*  --  26.0* 27.0* 26.4*  PLT  --   < > 127*  --  132* 135* 156  LABPROT  --   --   --   --   --   --  12.8  INR  --   --   --   --   --   --  0.98  HEPARINUNFRC 0.33  --  0.43  --  0.22*  --   --   CREATININE  --   --   --  0.60  --   --   --   < > = values in this interval not displayed.  Estimated Creatinine Clearance: 97.5 ml/min (by C-G formula based on Cr of 0.6).  Assessment:  Day # 2 of 5 minimum  Overlap of Lovenox and Coumadin.  INR at baseline after Coumadin 10 mg on 5/21.  Patient reports no further hematemesis.  Has self-administered Lovenox in the past (during pregnancy), and has taken Coumadin in the past. She also reports possible discharge today.  - Lovenox begun 5/21 with 85 mg SQ q12hrs.  Admit weight 72 kg, trended up to 83 kg.  IVF changed to [facility] on 5/21.    Goal of Therapy:  INR 2-3 Heparin level 0.6-1.2 units/ml Monitor platelets by anticoagulation protocol: Yes   Plan:   Adjusted Lovenox from 85 to 80 mg sq q12hrs, since weight is up from baseline and syringe size is 80 mg. Discussed with patient. Scheduled at 12n/12am, which she reports to be good for her usual home wake/sleep schedule.  Repeat Coumadin 10 mg today.  Can give prior to discharge, if home later today.  Would give prescription for 5 mg tablets, to allow flexibility with outpatient dosing.  Daily PT/INR while in the hospital.  Continue  Lovenox until INR >2.  Will follow up for discharge plans.  Dennie Fetters, [place] Pager: [phone] 08/05/2013,11:07 AM

## 2013-08-05 NOTE — Progress Notes (Signed)
Nsg Discharge Note  Admit Date:  08/01/2013 Discharge date: 08/05/2013   Lindsey [name] to be D/C'd Home per MD order.  AVS completed.  Copy for chart, and copy for patient signed, and dated. Patient/caregiver able to verbalize understanding.  Discharge Medication:   Medication List    STOP taking these medications       aspirin 325 MG tablet     BC HEADACHE POWDER PO     clopidogrel 75 MG tablet  Commonly known as:  PLAVIX      TAKE these medications       carvedilol 6.25 MG tablet  Commonly known as:  COREG  Take 1 tablet (6.25 mg total) by mouth 2 (two) times daily with a meal.     enoxaparin 80 MG/0.8ML injection  Commonly known as:  LOVENOX  Inject 0.8 mLs (80 mg total) into the skin every 12 (twelve) hours.     HYDROcodone-acetaminophen 5-325 MG per tablet  Commonly known as:  NORCO/VICODIN  Take 1 tablet by mouth every 6 (six) hours as needed for moderate pain or severe pain.     nitroGLYCERIN 0.4 MG SL tablet  Commonly known as:  NITROSTAT  Place 1 tablet (0.4 mg total) under the tongue every 5 (five) minutes as needed for chest pain (up to 3 doses).     ondansetron 8 MG disintegrating tablet  Commonly known as:  ZOFRAN-ODT  Take 8 mg by mouth every 4 (four) hours as needed for nausea. 8mg  ODT q4 hours prn nausea     pantoprazole 40 MG tablet  Commonly known as:  PROTONIX  Take 1 tablet (40 mg total) by mouth 2 (two) times daily.     simvastatin 20 MG tablet  Commonly known as:  ZOCOR  Take 20 mg by mouth at bedtime.     warfarin 5 MG tablet  Commonly known as:  COUMADIN  Take 2 tablets (10 mg total) by mouth one time only at 6 PM. Take 10mg  (2 tablets) today 5/22, 10mg  on 5/23, 7.5mg  (1.5 tablets) on 5/24, 7.5mg  on 5/25, 5mg  (1 tablet) on 5/26.        Discharge Assessment: Filed Vitals:   08/05/13 1356  BP: 150/98  Pulse: 71  Temp: 98.5 F (36.9 C)  Resp: 20   Skin clean, dry and intact without evidence of skin break down, no evidence of skin  tears noted. IV catheter discontinued intact. Site without signs and symptoms of complications - no redness or edema noted at insertion site, patient denies c/o pain - only slight tenderness at site.  Dressing with slight pressure applied.  D/c Instructions-Education: Discharge instructions given to patient/family with verbalized understanding. D/c education completed with patient/family including follow up instructions, medication list, d/c activities limitations if indicated, with other d/c instructions as indicated by MD - patient able to verbalize understanding, all questions fully answered. PRN Norco script put in for patient.  Patient instructed to return to ED, call 911, or call MD for any changes in condition.  Patient to be  escorted via WC, and D/C home via private auto.  Kern Reap, RN 08/05/2013 6:55 PM

## 2013-08-05 NOTE — Progress Notes (Signed)
Subjective:   VSS.  Transitioned to lovenox/coumadin last PM.  Pt reports she feel good today with minimal pain.  Is tolerating a regular diet.    Objective:   Vital signs in last 24 hours: Filed Vitals:   08/04/13 0544 08/04/13 1532 08/04/13 2039 08/05/13 0421  BP:  129/93 133/91 144/94  Pulse:  64 66 69  Temp:  98.5 F (36.9 C) 98.7 F (37.1 C) 98.3 F (36.8 C)  TempSrc:  Oral Oral Oral  Resp:  20 20 20   Height:      Weight: 183 lb 3.2 oz (83.099 kg)   182 lb 15.7 oz (83 kg)  SpO2:  99% 100% 99%    Weight: Filed Weights   08/03/13 0455 08/04/13 0544 08/05/13 0421  Weight: 175 lb 4.8 oz (79.516 kg) 183 lb 3.2 oz (83.099 kg) 182 lb 15.7 oz (83 kg)    Ins/Outs: No intake or output data in the 24 hours ending 08/05/13 8022  Physical Exam: Constitutional: Vital signs reviewed.  Patient does not appearing acutely ill. HEENT: [place]/AT; PERRL, EOMI, conjunctivae normal, no scleral icterus  Cardiovascular: RRR, no MRG Pulmonary/Chest: normal respiratory effort, no accessory muscle use, CTAB, no wheezes, rales, or rhonchi Abdominal: Soft. +BS, mild TTP in epigastric>LLQ Neurological: A&O x3, CN II-XII grossly intact; non-focal exam Extremities: 2+DP b/l, no C/C/E  Skin: Warm, dry and intact. No rash  Lab Results:  BMP:  Recent Labs Lab 08/02/13 0745 08/03/13 0835  NA 139 138  K 3.9 3.9  CL 107 108  CO2 20 21  GLUCOSE 136* 98  BUN 5* 3*  CREATININE 0.57 0.60  CALCIUM 8.8 8.8   Anion Gap:  9  CBC:  Recent Labs Lab 08/01/13 0937  08/04/13 0535 08/04/13 1235  WBC 9.0  < > 5.3 4.6  NEUTROABS 7.5  --   --  2.9  HGB 11.0*  < > 8.3* 8.6*  HCT 33.9*  < > 26.0* 27.0*  MCV 94.7  < > 95.6 95.7  PLT 139*  < > 132* 135*  < > = values in this interval not displayed.  CBG:            Recent Labs Lab 08/02/13 1152 08/02/13 1708 08/02/13 1929 08/03/13 0010 08/03/13 0402 08/03/13 0836  GLUCAP 116* 111* 103* 124* 99 97           HA1C:       Recent  Labs Lab 08/02/13 0745  HGBA1C 5.2    Lipid Panel:  Recent Labs Lab 08/01/13 1605  CHOL 187  HDL 76  LDLCALC 85  TRIG 131  CHOLHDL 2.5    LFTs:  Recent Labs Lab 08/01/13 0937 08/02/13 0745  AST 66* 28  ALT 42* 29  ALKPHOS 165* 138*  BILITOT 0.7 0.6  PROT 7.5 6.6  ALBUMIN 3.3* 3.0*    Pancreatic Enzymes:  Recent Labs Lab 08/01/13 0937  LIPASE 629*    Lactic Acid/Procalcitonin:  Recent Labs Lab 08/01/13 2030  LATICACIDVEN 1.2    Ammonia: No results found for this basename: AMMONIA,  in the last 168 hours  Cardiac Enzymes:  Recent Labs Lab 08/01/13 1850  TROPONINI <0.30    EKG: EKG Interpretation  Date/Time:  Monday Aug 01 2013 08:25:04 EDT Ventricular Rate:  91 PR Interval:  130 QRS Duration: 73 QT Interval:  368 QTC Calculation: 453 R Axis:   39 Text Interpretation:  Sinus rhythm Baseline wander in lead(s) III aVL No significant change since last tracing Confirmed  by Denton Lank  MD, Caryn Bee ([id number]) on 08/01/2013 8:42:29 AM   BNP: No results found for this basename: PROBNP,  in the last 168 hours  D-Dimer: No results found for this basename: DDIMER,  in the last 168 hours  Urinalysis:  Recent Labs Lab 08/01/13 0924  COLORURINE AMBER*  LABSPEC 1.027  PHURINE 5.0  GLUCOSEU NEGATIVE  HGBUR NEGATIVE  BILIRUBINUR MODERATE*  KETONESUR >80*  PROTEINUR 100*  UROBILINOGEN 1.0  NITRITE NEGATIVE  LEUKOCYTESUR NEGATIVE    Micro Results: No results found for this or any previous visit (from the past 240 hour(s)).  Blood Culture:    Component Value Date/Time   SDES URINE, CLEAN CATCH 09/25/2010 2220   SPECREQUEST NONE 09/25/2010 2220   CULT NO GROWTH 09/25/2010 2220   REPTSTATUS 09/26/2010 FINAL 09/25/2010 2220    Studies/Results: No results found.  Medications:  Scheduled Meds: . enoxaparin (LOVENOX) injection  85 mg Subcutaneous Q12H  . pantoprazole  40 mg Oral BID  . senna-docusate  2 tablet Oral BID  . sodium chloride  3 mL  Intravenous Q12H  . warfarin   Does not apply Once  . Warfarin - Pharmacist Dosing Inpatient   Does not apply q1800   Continuous Infusions: . sodium chloride 20 mL/hr at 08/04/13 1545   PRN Meds: HYDROcodone-acetaminophen, ondansetron  Antibiotics: Antibiotics Given (last 72 hours)   None      Day of Hospitalization: 4  Consults:    Assessment/Plan:   Principal Problem:   Pancreatitis, acute Active Problems:   HYPERLIPIDEMIA-MIXED   HYPERTENSION   Antithrombin III deficiency   Pancreatitis   Apical mural thrombus   Hematemesis   Gastritis and duodenitis   Diverticulosis   Fatty liver   Esophagitis   Hiatal hernia  Acute pancreatitis Pt doing better and tolerating regular diet.  No further N/V with minimal pain.  Requiring minimal pain meds.   -po vicodin PRN -stable for d/c today   Hematemesis  No episodes today; hgb stable even though has trended down.  EGD 06/09/12: 2cm hiatal hernia.  Consulted GI and EGD shows esophagitis, erosive gastritis, and duodenitis with small hiatal hernia again noted.  -PPI po bid (continue po for 4 weeks bid, then qam long term) -avoid NSAIDS, ETOH -monitor for bleeding in next 4-6 wks  Left ventricular thrombus Cards consulted signed off yesterday.  Pt was on heparin gtt with transition to lovenox-->coumadin.  -lovenox, coumadin per pharmacy  -will d/c with lovenox bridge to coumadin -f/u with Dr. Alexandria Lodge on Wednesday to have INR checked  h/o CAD  s/p anterior STEMI in 2000 with PCI of LAD; s/p PTCA and bare-metal stent to the LAD in 2009. s/p cath on May 03, 2009, showing no obstructive disease with LVEF of 55%.  -d/c ASA, plavix per GI  Hypertension  Stable.  -hold home meds while npo  Elevated transaminases  Resolved.    h/o CVA/TIA  Trops x 1 neg.   -d/c ASA 325mg , plavix 75mg  -continue statin  -lovenox-->coumadin   Dyslipidemia  Pt is on simvastatin 20mg  at home.  Lab Results   Component  Value  Date     LDLCALC  85  08/01/2013   -continue statin   FEN  Fluids-None  Electrolytes-  Nutrition-regular diet  VTE prophylaxis  d/c heparin gtt lovenox-->coumadin  Disposition Anticipated discharge today with coumadin and lovenox per pharmacy.      LOS: 4 days   Marrian Salvage, MD PGY-1, Internal Medicine Teaching Service [phone] (7AM-5PM Mon-Fri) 08/05/2013,  6:52 AM

## 2013-08-05 NOTE — Progress Notes (Signed)
    Day 4 of stay      Patient name: Lindsey [name]  Medical record number: [id number]  Date of birth: April 12, 1970  Patient seen and evaluated on morning rounds this morning. Doing well. No abdominal pain today. Eating regular diet.    Filed Vitals:   08/04/13 1532 08/04/13 2039 08/05/13 0421 08/05/13 1356  BP: 129/93 133/91 144/94 150/98  Pulse: 64 66 69 71  Temp: 98.5 F (36.9 C) 98.7 F (37.1 C) 98.3 F (36.8 C) 98.5 F (36.9 C)  TempSrc: Oral Oral Oral Oral  Resp: 20 20 20 20   Height:      Weight:   182 lb 15.7 oz (83 kg)   SpO2: 99% 100% 99% 100%   Physical exam - abdomen - soft, no tenderness. Cardiac exam - RRR, no murmurs. Lungs - clear. No pedal edema.    Recent Labs Lab 08/04/13 0535 08/04/13 1235 08/05/13 0706  HGB 8.3* 8.6* 8.6*  HCT 26.0* 27.0* 26.4*  WBC 5.3 4.6 5.6  PLT 132* 135* 156    Recent Labs Lab 08/01/13 0937 08/02/13 0745 08/03/13 0835  NA 135* 139 138  K 4.3 3.9 3.9  CL 100 107 108  CO2 15* 20 21  GLUCOSE 89 136* 98  BUN 10 5* 3*  CREATININE 0.66 0.57 0.60  CALCIUM 8.7 8.8 8.8    Assessment and Plan    Stable for discharge - She can be prescribed lovenox and coumadin and be seen in clinic on Wednesday by Dr Alexandria Lodge. We will run this by GI. Per their note, patient needs to be monitored closely for UGIB for the next 4-6 weeks. PPI BID for 4 weeks and GI follow up later.   I have discussed the care of this patient with my IM team residents. Please see the resident note for details.  Gaylyn Berish 08/05/2013, 2:15 PM.

## 2013-08-05 NOTE — Progress Notes (Signed)
  Subjective: Patient was feeling much better today, ready to go home. Tolerating regular diet, no N/V. Pain has improved with some mild intermittent pain at her LUQ, 0-4 in intensity.No other new complaint. Objective: Vital signs in last 24 hours: Filed Vitals:   08/04/13 1532 08/04/13 2039 08/05/13 0421 08/05/13 1356  BP: 129/93 133/91 144/94 150/98  Pulse: 64 66 69 71  Temp: 98.5 F (36.9 C) 98.7 F (37.1 C) 98.3 F (36.8 C) 98.5 F (36.9 C)  TempSrc: Oral Oral Oral Oral  Resp: 20 20 20 20   Height:      Weight:   83 kg (182 lb 15.7 oz)   SpO2: 99% 100% 99% 100%   Weight change: -0.099 kg (-3.5 oz) No intake or output data in the 24 hours ending 08/05/13 1408 EXAM:  Patient was alert, comfortable, not in any distress.  HEENT: AT/[place], perrl, EOMI  NECK: Supple, no adenopathy, no JVD.  LUNGS: Clear B/L, no added sound  HEART: RRR< S1 and S2 NL, no MRG.  ABDOMEN: soft, non distended, non tender. No guarding or rebound. No masses. BS +ve  EXT: no edema, cyanosis. PP 2+ B/L  NEURO: grossly intact     Lab Results: @LABTEST2 @ Micro Results: No results found for this or any previous visit (from the past 240 hour(s)). Studies/Results: No results found. Medications: medication reviewed Scheduled Meds: . enoxaparin (LOVENOX) injection  80 mg Subcutaneous Q12H  . pantoprazole  40 mg Oral BID  . senna-docusate  2 tablet Oral BID  . sodium chloride  3 mL Intravenous Q12H  . warfarin  10 mg Oral ONCE-1800  . warfarin   Does not apply Once  . Warfarin - Pharmacist Dosing Inpatient   Does not apply q1800   Continuous Infusions: . sodium chloride 20 mL/hr at 08/04/13 1545   PRN Meds:.HYDROcodone-acetaminophen, ondansetron Assessment/Plan: ACUTE PANCREATITIS: C/O mild , intermittent left sided abd. Pain, 0-4 in intensity  much improved then before, no N/V, tolerating regular diet well .She is off the fluids,  on oral pain meds. Not needing much. Discharge today with follow up  appt. HEMATEMESIS: No more incidence of hematemesis, continue with oral PPI for long term as advised by GI due to her gastric erosion on EGD.  APICAL MURAL THROMBUS: On heparin drip, should start her on coumadin and levenox today after pharmacy consult. Her Hg is keep dropping, looks more like dilutional effect and all of her counts are down trending, recheck CBC tomorrow and decrease her fluid  HTN: Her BP today is 150/98,  start her home meds. And keep monitoring.  HYPERLIPIDEMIA: Continue with her statin once pancreatitis resolved.  ANTITHROMBIN III DEFICIENCY: She needs long term anticoagulation, she was started on coumadin 10 mg with Lovenox bridge for 5 days, monitor INR and adjust the dose accordingly.   This is a Psychologist, occupational Note.  The care of the patient was discussed with Dr. Shirlee Latch and the assessment and plan formulated with their assistance.  Please see their attached note for official documentation of the daily encounter.   LOS: 4 days   Arnetha Courser, Med Student 08/05/2013, 2:08 PM

## 2013-08-06 NOTE — Progress Notes (Signed)
I repeated the critical or key portions of the exam.  I confirmed/revised the medical student's history, exam, assessment and plan.  Please see my note for further details.  

## 2013-08-09 ENCOUNTER — Encounter: Payer: Medicaid Other | Admitting: Cardiology

## 2013-08-09 NOTE — Progress Notes (Signed)
HPI: FU history of coronary artery disease and left ventricular apical thrombus. Patient has had previous PCI of her LAD. Last cardiac catheterization in October of 2013 showed normal LV function with apical akinesis/aneurysm. There was no disease in the left main. The stent in the proximal LAD had a 30-40% in-stent restenosis. There was no significant disease in the circumflex or right coronary artery. She has been treated medically. Patient has had a previous CVA. There is a question of anti-thrombin III deficiency. Patient admitted in May of 2015 pancreatitis. Abdominal CT felt consistent with apical thrombus. Echocardiogram May 2015 showed an ejection fraction of 55-60%. There was a small apical thrombus. Patient placed on Coumadin. Since then,   Current Outpatient Prescriptions  Medication Sig Dispense Refill  . carvedilol (COREG) 6.25 MG tablet Take 1 tablet (6.25 mg total) by mouth 2 (two) times daily with a meal.  60 tablet  6  . enoxaparin (LOVENOX) 80 MG/0.8ML injection Inject 0.8 mLs (80 mg total) into the skin every 12 (twelve) hours.  10 Syringe  0  . HYDROcodone-acetaminophen (NORCO/VICODIN) 5-325 MG per tablet Take 1 tablet by mouth every 6 (six) hours as needed for moderate pain or severe pain.  8 tablet  0  . nitroGLYCERIN (NITROSTAT) 0.4 MG SL tablet Place 1 tablet (0.4 mg total) under the tongue every 5 (five) minutes as needed for chest pain (up to 3 doses).  25 tablet  3  . ondansetron (ZOFRAN-ODT) 8 MG disintegrating tablet Take 8 mg by mouth every 4 (four) hours as needed for nausea. 8mg  ODT q4 hours prn nausea      . pantoprazole (PROTONIX) 40 MG tablet Take 1 tablet (40 mg total) by mouth 2 (two) times daily.  60 tablet  0  . simvastatin (ZOCOR) 20 MG tablet Take 20 mg by mouth at bedtime.      Marland Kitchen warfarin (COUMADIN) 5 MG tablet Take 2 tablets (10 mg total) by mouth one time only at 6 PM. Take 10mg  (2 tablets) today 5/22, 10mg  on 5/23, 7.5mg  (1.5 tablets) on 5/24, 7.5mg   on 5/25, 5mg  (1 tablet) on 5/26.  30 tablet  1   No current facility-administered medications for this visit.     Past Medical History  Diagnosis Date  . Stroke     assoc with short term memory loss and right peripheral vision loss; age 10   . Blood transfusion   . Anxiety   . Coronary artery disease     Apical LAD infarction '00; NSTEMI s/p BMS to prox LAD '09; Cath 12/2011 single vessel CAD w/ patent LAD stent w/ stable mild ISR, nl LV systolic fxn  . Anemia   . Antithrombin III deficiency     ?pt not sure if true diagnosis  . Tobacco abuse   . High blood pressure   . Myocardial infarction     Past Surgical History  Procedure Laterality Date  . Tubal ligation    . Cardiac catheterization    . Esophagogastroduodenoscopy N/A 06/09/2012    Procedure: ESOPHAGOGASTRODUODENOSCOPY (EGD);  Surgeon: Theda Belfast, MD;  Location: [facility];  Service: Endoscopy;  Laterality: N/A;  . Coronary angioplasty    . Esophagogastroduodenoscopy N/A 08/02/2013    Procedure: ESOPHAGOGASTRODUODENOSCOPY (EGD);  Surgeon: Meryl Dare, MD;  Location: [facility];  Service: Endoscopy;  Laterality: N/A;    History   Social History  . Marital Status: Divorced    Spouse Name: N/A    Number of Children:  2  . Years of Education: N/A   Occupational History  . Not on file.   Social History Main Topics  . Smoking status: Former Smoker -- 0.20 packs/day for 1 years    Types: Cigarettes  . Smokeless tobacco: Never Used  . Alcohol Use: 0.0 oz/week    0 Cans of beer per week     Comment: occasion  . Drug Use: No  . Sexual Activity: Yes    Birth Control/ Protection: None   Other Topics Concern  . Not on file   Social History Narrative  . No narrative on file    ROS: no fevers or chills, productive cough, hemoptysis, dysphasia, odynophagia, melena, hematochezia, dysuria, hematuria, rash, seizure activity, orthopnea, PND, pedal edema, claudication. Remaining systems are  negative.  Physical Exam: Well-developed well-nourished in no acute distress.  Skin is warm and dry.  HEENT is normal.  Neck is supple.  Chest is clear to auscultation with normal expansion.  Cardiovascular exam is regular rate and rhythm.  Abdominal exam nontender or distended. No masses palpated. Extremities show no edema. neuro grossly intact  ECG     This encounter was created in error - please disregard.

## 2013-08-10 ENCOUNTER — Ambulatory Visit (INDEPENDENT_AMBULATORY_CARE_PROVIDER_SITE_OTHER): Payer: Medicaid Other | Admitting: Pharmacist

## 2013-08-10 DIAGNOSIS — I513 Intracardiac thrombosis, not elsewhere classified: Secondary | ICD-10-CM

## 2013-08-10 DIAGNOSIS — Z7901 Long term (current) use of anticoagulants: Secondary | ICD-10-CM

## 2013-08-10 DIAGNOSIS — I2109 ST elevation (STEMI) myocardial infarction involving other coronary artery of anterior wall: Secondary | ICD-10-CM

## 2013-08-10 LAB — POCT INR: INR: 1.7

## 2013-08-10 NOTE — Progress Notes (Signed)
Anti-Coagulation Progress Note  Lindsey [name] is a 43 y.o. female who is currently on an anti-coagulation regimen.    RECENT RESULTS: Recent results are below, the most recent result is correlated with a dose of taking 2x5mg  daily x 3 days with de-escalating dose to 7.5mg  x 2 days. During all this time she has remained upon Lovenox 1mg /kg SQ q12h as prescribed at discharge from hospital. She states she has 2 days remaining of Lovenox therapy. She was instructed to continue these syringes until she exhausts her supply.  Lab Results  Component Value Date   INR 1.70 08/10/2013   INR 0.98 08/05/2013   INR 1.04 12/23/2011    ANTI-COAG DOSE: Anticoagulation Dose Instructions as of 08/10/2013     Glynis Smiles Tue Wed Thu Fri Sat   New Dose 7.5 mg 0 mg 0 mg 10 mg 10 mg 7.5 mg 7.5 mg    Description       Patient will continue upon LMWH 1mg /kg *(enoxaparin) SQ q12h. She has 2 days remaining.        ANTICOAG SUMMARY: Anticoagulation Episode Summary   Current INR goal 2.0-3.0  Next INR check 08/15/2013  INR from last check 1.70! (08/10/2013)  Weekly max dose   Target end date   [facility] location [facility]  Preferred lab   Send INR reminders to    Indications  Apical mural thrombus [410.10] Long term (current) use of anticoagulants [V58.61]        Comments         ANTICOAG TODAY: Anticoagulation Summary as of 08/10/2013   INR goal 2.0-3.0  Selected INR 1.70! (08/10/2013)  Next INR check 08/15/2013  Target end date    Indications  Apical mural thrombus [410.10] Long term (current) use of anticoagulants [V58.61]      Anticoagulation Episode Summary   [facility] check [facility]   Preferred lab    Send INR reminders to    Comments       PATIENT INSTRUCTIONS: Patient Instructions  Patient instructed to take medications as defined in the Anti-coagulation Track section of this encounter.  Patient instructed to take today's dose.  Patient instructed to continue her  Lovenox injections every 12h as previously prescribed. She has 2 days remaining.Patient verbalized understanding of these instructions.       FOLLOW-UP Return in 5 days (on 08/15/2013) for Follow up INR at 0930h.  Hulen Luster, III Pharm.D., CACP

## 2013-08-10 NOTE — Patient Instructions (Signed)
Patient instructed to take medications as defined in the Anti-coagulation Track section of this encounter.  Patient instructed to take today's dose.  Patient instructed to continue her Lovenox injections every 12h as previously prescribed. She has 2 days remaining.Patient verbalized understanding of these instructions.

## 2013-08-15 ENCOUNTER — Ambulatory Visit (INDEPENDENT_AMBULATORY_CARE_PROVIDER_SITE_OTHER): Payer: Medicaid Other | Admitting: Internal Medicine

## 2013-08-15 ENCOUNTER — Ambulatory Visit (INDEPENDENT_AMBULATORY_CARE_PROVIDER_SITE_OTHER): Payer: Medicaid Other | Admitting: Pharmacist

## 2013-08-15 ENCOUNTER — Encounter: Payer: Self-pay | Admitting: Internal Medicine

## 2013-08-15 ENCOUNTER — Telehealth: Payer: Self-pay

## 2013-08-15 VITALS — BP 122/92 | HR 93 | Temp 97.5°F | Ht 66.5 in | Wt 180.9 lb

## 2013-08-15 DIAGNOSIS — Z7901 Long term (current) use of anticoagulants: Secondary | ICD-10-CM

## 2013-08-15 DIAGNOSIS — I2109 ST elevation (STEMI) myocardial infarction involving other coronary artery of anterior wall: Secondary | ICD-10-CM

## 2013-08-15 DIAGNOSIS — I513 Intracardiac thrombosis, not elsewhere classified: Secondary | ICD-10-CM

## 2013-08-15 DIAGNOSIS — R600 Localized edema: Secondary | ICD-10-CM | POA: Insufficient documentation

## 2013-08-15 DIAGNOSIS — F329 Major depressive disorder, single episode, unspecified: Secondary | ICD-10-CM | POA: Insufficient documentation

## 2013-08-15 DIAGNOSIS — R609 Edema, unspecified: Secondary | ICD-10-CM

## 2013-08-15 DIAGNOSIS — Z09 Encounter for follow-up examination after completed treatment for conditions other than malignant neoplasm: Secondary | ICD-10-CM

## 2013-08-15 DIAGNOSIS — F32A Depression, unspecified: Secondary | ICD-10-CM | POA: Insufficient documentation

## 2013-08-15 DIAGNOSIS — I219 Acute myocardial infarction, unspecified: Secondary | ICD-10-CM

## 2013-08-15 DIAGNOSIS — F3289 Other specified depressive episodes: Secondary | ICD-10-CM

## 2013-08-15 LAB — BASIC METABOLIC PANEL
BUN: 3 mg/dL — AB (ref 6–23)
CHLORIDE: 107 meq/L (ref 96–112)
CO2: 25 meq/L (ref 19–32)
CREATININE: 0.59 mg/dL (ref 0.50–1.10)
Calcium: 9 mg/dL (ref 8.4–10.5)
Glucose, Bld: 100 mg/dL — ABNORMAL HIGH (ref 70–99)
Potassium: 3.9 mEq/L (ref 3.5–5.3)
Sodium: 141 mEq/L (ref 135–145)

## 2013-08-15 LAB — CBC
HCT: 30.2 % — ABNORMAL LOW (ref 36.0–46.0)
Hemoglobin: 9.6 g/dL — ABNORMAL LOW (ref 12.0–15.0)
MCH: 28.3 pg (ref 26.0–34.0)
MCHC: 31.8 g/dL (ref 30.0–36.0)
MCV: 89.1 fL (ref 78.0–100.0)
PLATELETS: 305 10*3/uL (ref 150–400)
RBC: 3.39 MIL/uL — ABNORMAL LOW (ref 3.87–5.11)
RDW: 19.3 % — ABNORMAL HIGH (ref 11.5–15.5)
WBC: 7.8 10*3/uL (ref 4.0–10.5)

## 2013-08-15 LAB — POCT INR: INR: 2.3

## 2013-08-15 MED ORDER — SERTRALINE HCL 25 MG PO TABS: 25.0000 mg | ORAL_TABLET | Freq: Every day | ORAL | Status: DC

## 2013-08-15 NOTE — Progress Notes (Signed)
Subjective:    Patient ID: Lindsey [name], female    DOB: Feb 20, 1971, 43 y.o.   MRN: [id number]  HPI Lindsey [name] is a 43yo woman pmh as listed below presents for hospital follow up.   Pt was hospitalized 5/18-5/22 for acute pancreatitis complicated by hematemesis. Pt was started on lovenox and warfarin for an apical mural thrombus. Pt states she has been compliant with her medications and follow up visits at the coumadin clinic and was actually seen and evaluated today for warfarin dosing.   She has had continued relief of her pancreatitis and no return of hematemesis, vomiting, abdominal pain. She denies any fevers, chills, easy bruising or frank bleeding and no dark tarry stools. She's been tolerating an advanced diet without any problems. She does have some marked anxiety in regards to the mural thrombus that was found but has not had any palpitations, headaches, blurry vision, dyspnea on exertion,PND, or shortness of breath. Her only new symptom appears to be bilateral lower summary weakness that started shortly after her discharge with her left leg greater than her right. She states that when she elevates her feet at night that it improves, is not painful and she does not have claudication.  She is accompanied by her husband today he states that his main concern at this point is her mood. She was only recently seen any psychologist for the past 2 weeks in regards to her worry and fear of "impending DM." He further explains that she is having some sleep disturbance, lack of focus, some spontaneous crying, lack of motivation to perform ADLs,and intense fear of stigma by her family and friends if she were to start medication.  Past Medical History  Diagnosis Date  . Stroke     assoc with short term memory loss and right peripheral vision loss; age 38   . Blood transfusion   . Anxiety   . Coronary artery disease     Apical LAD infarction '00; NSTEMI s/p BMS to prox LAD '09; Cath 12/2011 single  vessel CAD w/ patent LAD stent w/ stable mild ISR, nl LV systolic fxn  . Anemia   . Antithrombin III deficiency     ?pt not sure if true diagnosis  . Tobacco abuse   . High blood pressure   . Myocardial infarction    Current Outpatient Prescriptions on File Prior to Visit  Medication Sig Dispense Refill  . carvedilol (COREG) 6.25 MG tablet Take 1 tablet (6.25 mg total) by mouth 2 (two) times daily with a meal.  60 tablet  6  . HYDROcodone-acetaminophen (NORCO/VICODIN) 5-325 MG per tablet Take 1 tablet by mouth every 6 (six) hours as needed for moderate pain or severe pain.  8 tablet  0  . nitroGLYCERIN (NITROSTAT) 0.4 MG SL tablet Place 1 tablet (0.4 mg total) under the tongue every 5 (five) minutes as needed for chest pain (up to 3 doses).  25 tablet  3  . ondansetron (ZOFRAN-ODT) 8 MG disintegrating tablet Take 8 mg by mouth every 4 (four) hours as needed for nausea. 8mg  ODT q4 hours prn nausea      . pantoprazole (PROTONIX) 40 MG tablet Take 1 tablet (40 mg total) by mouth 2 (two) times daily.  60 tablet  0  . simvastatin (ZOCOR) 20 MG tablet Take 20 mg by mouth at bedtime.      Marland Kitchen warfarin (COUMADIN) 5 MG tablet Take 2 tablets (10 mg total) by mouth one time only at 6 PM.  Take 10mg  (2 tablets) today 5/22, 10mg  on 5/23, 7.5mg  (1.5 tablets) on 5/24, 7.5mg  on 5/25, 5mg  (1 tablet) on 5/26.  30 tablet  1   No current facility-administered medications on file prior to visit.   Social, surgical, family history reviewed with patient and updated in appropriate chart locations.   Review of Systems  Constitutional: Negative for fever, chills, activity change, appetite change, fatigue and unexpected weight change.  Eyes: Negative for visual disturbance.  Respiratory: Negative for cough, chest tightness and shortness of breath.   Cardiovascular: Positive for leg swelling. Negative for chest pain and palpitations.  Gastrointestinal: Negative for nausea, vomiting, abdominal pain, diarrhea,  constipation, blood in stool and abdominal distention.  Endocrine: Negative for cold intolerance.  Neurological: Negative for syncope, weakness and headaches.  Psychiatric/Behavioral: Positive for sleep disturbance, dysphoric mood and decreased concentration. Negative for suicidal ideas and self-injury. The patient is nervous/anxious.       Objective:   Physical Exam Filed Vitals:   08/15/13 1015  BP: 122/92  Pulse: 93  Temp: 97.5 F (36.4 C)   General: sitting in chair, slightly nervous HEENT: PERRL, EOMI, no scleral icterus Cardiac: RRR, no rubs, murmurs or gallops Pulm: clear to auscultation bilaterally, no crackles or wheezes, moving normal volumes of air Abd: soft, nontender, nondistended, BS present Ext: warm and well perfused, 1+ pitting edema on right leg to ankle, 2+ pedal edema of left leg to knee Neuro: alert and oriented X3, cranial nerves II-XII grossly intact     Assessment & Plan:  Please see problem oriented charting  Pt discussed with Dr. Dalphine HandingBhardwaj

## 2013-08-15 NOTE — Patient Instructions (Signed)
Patient instructed to take medications as defined in the Anti-coagulation Track section of this encounter.  Patient instructed to take today's dose.  Patient verbalized understanding of these instructions.    

## 2013-08-15 NOTE — Telephone Encounter (Signed)
The pt and spouse came into the office to schedule Cardiology follow-up.  The pt no showed for 08/09/13 office visit with Dr Jens Som after our office reminded her of appointment on 08/05/13. The pt had multiple questions while in scheduling and she asked to speak with a nurse.  I spoke with the pt and her husband about the pt having bilateral swelling in her feet. When assessing the pt the swelling is in the tops of her feet only not her legs or ankles. I made the pt aware that EF on Echo was normal (55-60%).  The pt was worried that swelling could be a blood clot in her legs.  The pt denies pain in her legs and she does not have discoloration or swelling. I advised the pt that she needs to elevate her legs, wear compression stockings and decrease salt in diet. I also educated the pt on the importance of medication compliance with coumadin and cessation from alcohol. Jasmine December in scheduling will arrange appointment for pt to follow-up.

## 2013-08-15 NOTE — Patient Instructions (Signed)
Sertraline tablets  What is this medicine?  SERTRALINE (SER tra leen) is used to treat depression. It may also be used to treat obsessive compulsive disorder, panic disorder, post-trauma stress, premenstrual dysphoric disorder (PMDD) or social anxiety.  This medicine may be used for other purposes; ask your health care provider or pharmacist if you have questions.  COMMON BRAND NAME(S): Zoloft  What should I tell my health care provider before I take this medicine?  They need to know if you have any of these conditions:  -bipolar disorder or a family history of bipolar disorder  -diabetes  -glaucoma  -heart disease  -high blood pressure  -history of irregular heartbeat  -history of low levels of calcium, magnesium, or potassium in the blood  -if you often drink alcohol  -liver disease  -receiving electroconvulsive therapy  -seizures  -suicidal thoughts, plans, or attempt; a previous suicide attempt by you or a family member  -thyroid disease  -an unusual or allergic reaction to sertraline, other medicines, foods, dyes, or preservatives  -pregnant or trying to get pregnant  -breast-feeding  How should I use this medicine?  Take this medicine by mouth with a glass of water. Follow the directions on the prescription label. You can take it with or without food. Take your medicine at regular intervals. Do not take your medicine more often than directed. Do not stop taking this medicine suddenly except upon the advice of your doctor. Stopping this medicine too quickly may cause serious side effects or your condition may worsen.  A special MedGuide will be given to you by the pharmacist with each prescription and refill. Be sure to read this information carefully each time.  Talk to your pediatrician regarding the use of this medicine in children. While this drug may be prescribed for children as young as 7 years for selected conditions, precautions do apply.  Overdosage: If you think you have taken too much of this  medicine contact a poison control center or emergency room at once.  NOTE: This medicine is only for you. Do not share this medicine with others.  What if I miss a dose?  If you miss a dose, take it as soon as you can. If it is almost time for your next dose, take only that dose. Do not take double or extra doses.  What may interact with this medicine?  Do not take this medicine with any of the following medications:  -certain medicines for fungal infections like fluconazole, itraconazole, ketoconazole, posaconazole, voriconazole  -cisapride  -disulfiram  -dofetilide  -linezolid  -MAOIs like Carbex, Eldepryl, Marplan, Nardil, and Parnate  -metronidazole  -methylene blue (injected into a vein)  -pimozide  -thioridazine  -ziprasidone   This medicine may also interact with the following medications:  -alcohol  -aspirin and aspirin-like medicines  -certain medicines for depression, anxiety, or psychotic disturbances  -certain medicines for irregular heart beat like flecainide, propafenone  -certain medicines for migraine headaches like almotriptan, eletriptan, frovatriptan, naratriptan, rizatriptan, sumatriptan, zolmitriptan  -certain medicines for sleep  -certain medicines for seizures like carbamazepine, valproic acid, phenytoin  -certain medicines that treat or prevent blood clots like warfarin, enoxaparin, dalteparin  -cimetidine  -digoxin  -diuretics  -fentanyl  -furazolidone  -isoniazid  -lithium  -NSAIDs, medicines for pain and inflammation, like ibuprofen or naproxen  -other medicines that prolong the QT interval (cause an abnormal heart rhythm)  -procarbazine  -rasagiline  -supplements like St. John's wort, kava kava, valerian  -tolbutamide  -tramadol  -tryptophan  This   list may not describe all possible interactions. Give your health care provider a list of all the medicines, herbs, non-prescription drugs, or dietary supplements you use. Also tell them if you smoke, drink alcohol, or use illegal drugs. Some  items may interact with your medicine.  What should I watch for while using this medicine?  Tell your doctor if your symptoms do not get better or if they get worse. Visit your doctor or health care professional for regular checks on your progress. Because it may take several weeks to see the full effects of this medicine, it is important to continue your treatment as prescribed by your doctor.  Patients and their families should watch out for new or worsening thoughts of suicide or depression. Also watch out for sudden changes in feelings such as feeling anxious, agitated, panicky, irritable, hostile, aggressive, impulsive, severely restless, overly excited and hyperactive, or not being able to sleep. If this happens, especially at the beginning of treatment or after a change in dose, call your health care professional.  You may get drowsy or dizzy. Do not drive, use machinery, or do anything that needs mental alertness until you know how this medicine affects you. Do not stand or sit up quickly, especially if you are an older patient. This reduces the risk of dizzy or fainting spells. Alcohol may interfere with the effect of this medicine. Avoid alcoholic drinks.  Your mouth may get dry. Chewing sugarless gum or sucking hard candy, and drinking plenty of water may help. Contact your doctor if the problem does not go away or is severe.  What side effects may I notice from receiving this medicine?  Side effects that you should report to your doctor or health care professional as soon as possible:  -allergic reactions like skin rash, itching or hives, swelling of the face, lips, or tongue  -black or bloody stools, blood in the urine or vomit  -fast, irregular heartbeat  -feeling faint or lightheaded, falls  -hallucination, loss of contact with reality  -seizures  -suicidal thoughts or other mood changes  -unusual bleeding or bruising  -unusually weak or tired  -vomiting  Side effects that usually do not require  medical attention (report to your doctor or health care professional if they continue or are bothersome):  -change in appetite  -change in sex drive or performance  -diarrhea  -increased sweating  -indigestion, nausea  -tremors  This list may not describe all possible side effects. Call your doctor for medical advice about side effects. You may report side effects to FDA at 1-800-FDA-1088.  Where should I keep my medicine?  Keep out of the reach of children.  Store at room temperature between 15 and 30 degrees C (59 and 86 degrees F). Throw away any unused medicine after the expiration date.  NOTE: This sheet is a summary. It may not cover all possible information. If you have questions about this medicine, talk to your doctor, pharmacist, or health care provider.  © 2014, [employer]. (2012-09-28 12:57:35)

## 2013-08-15 NOTE — Assessment & Plan Note (Addendum)
The patient's husband presents concerned that the patient's mood has become more depressed lately, and she is starting to neglect her ADLs and have a marked anhedonia. The patient is seeing a counselor for the past 2 weeks and this was strongly encouraged that even while interviewing the patient she feels that this is "just not enough to make me feel better." Her largest obstacle to use starting medication is the stigma she feels against the African American community in regards to medication for mental health illness.but despite this the patient is amenable to having a standing prescription at this time for Zoloft that she may want to start along with continuing her outpatient counseling sessions. Lab Results  Component Value Date   TSH 1.251 12/23/2011   Given that her last TSH was performed several years ago it may be worth repeating given her lower extremity edema and depression. -TSH -Zoloft 25 mg daily

## 2013-08-15 NOTE — Assessment & Plan Note (Signed)
This was an incidental finding during her recent hospitalization for acute pancreatitis. 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 The patient denies any symptoms and did not have any cardiac arrhythmias picked up during her hospitalization. -Continue anticoagulation with Coumadin -Patient has completed Lovenox bridging and therefore was discontinued -The patient will have followup with Dr. Jens Som of  cardiology on 08/23/2013

## 2013-08-15 NOTE — Assessment & Plan Note (Addendum)
A CBC and beam that were drawn for hospital followup given previous hematemesis and now starting anticoagulation along with bmet given recovery from pancreatitis. The patient does continue to drink and per her husband "likes her barley too much"and smoke at this time. Extensive education in regards to alcohol cessation in regards to preventing recurrent pancreatitis and smoking cessation in regards to overall health benefits was had with both the patient and husband during the visit. -cbc, bmet -pt has cardiology appt already established -patient denied any resources for smoking cessation or alcohol cessation at this time

## 2013-08-15 NOTE — Progress Notes (Signed)
Anti-Coagulation Progress Note  Lindsey [name] is a 43 y.o. female who reports to the clinic for monitoring of anticoagulation treatment.    RECENT RESULTS: Recent results are below, the most recent result is correlated with a dose of 52.5 mg. per week (no change in total weekly dose): Lab Results  Component Value Date   INR 2.3 08/15/2013   INR 1.70 08/10/2013   INR 0.98 08/05/2013    ANTI-COAG DOSE: Anticoagulation Dose Instructions as of 08/15/2013     Glynis Smiles Tue Wed Thu Fri Sat   New Dose 7.5 mg 7.5 mg 7.5 mg 7.5 mg 7.5 mg 7.5 mg 7.5 mg       ANTICOAG SUMMARY: Anticoagulation Episode Summary   Current INR goal 2.0-3.0  Next INR check 08/22/2013  INR from last check 2.3 (08/15/2013)  Weekly max dose   Target end date   [facility]  Preferred lab   Send INR reminders to    Indications  Apical mural thrombus [410.10] Long term (current) use of anticoagulants [V58.61]        Comments         ANTICOAG TODAY: Anticoagulation Summary as of 08/15/2013   INR goal 2.0-3.0  Selected INR 2.3 (08/15/2013)  Next INR check 08/22/2013  Target end date    Indications  Apical mural thrombus [410.10] Long term (current) use of anticoagulants [V58.61]      Anticoagulation Episode Summary   [facility]   Preferred lab    Send INR reminders to    Comments       PATIENT INSTRUCTIONS: Patient Instructions  Patient instructed to take medications as defined in the Anti-coagulation Track section of this encounter.  Patient instructed to take today's dose.  Patient verbalized understanding of these instructions.     FOLLOW-UP Return in about 7 days (around 08/22/2013) for Follow up INR at 0930.  Marzetta Board, PharmD BCPS, BCACP

## 2013-08-15 NOTE — Assessment & Plan Note (Signed)
Pt complains of new bilateral L>R edema. DDx can include DVT in context of Antithrombin III deficiency and recent thrombus discovered during pancreatitis episode. Given unilarity not consistent with heart failure, pt did receive large volumes of IVF during hospitalization and maybe contributing, real and hepatic causes will be addressed by f/u bmet order placed during today's visit. Pt doesn't appear to be on any offending drugs at this time and had no LAD on exam to suggest lymphatic disturbance. Therefore watchful waiting and elevation, compression stockings was recommended.

## 2013-08-16 ENCOUNTER — Encounter: Payer: Self-pay | Admitting: Internal Medicine

## 2013-08-16 LAB — TSH: TSH: 1.483 u[IU]/mL (ref 0.350–4.500)

## 2013-08-16 NOTE — Discharge Summary (Signed)
Name: Lindsey [name] MRN: [id number] DOB: 10/19/1970 43 y.o. PCP: Kennis CarinaEjiroghene Emokpae, MD ______________________________________________________________  Date of Admission: 08/01/2013  8:19 AM Date of Discharge: 08/05/2013 Attending Physician: Matt HolmesShipla Bhardwaj, MD   Discharge Diagnosis: Acute pancreatitis Left ventricular apical thrombus Hematemesis Hypertension Dyslipidemia History of CVA/TIA History of CAD Antithrombin III deficiency Hiatal hernia Gastritis and duodenitis   Discharge Medications:   Medication List    STOP taking these medications       aspirin 325 MG tablet     BC HEADACHE POWDER PO     clopidogrel 75 MG tablet  Commonly known as:  PLAVIX      TAKE these medications       carvedilol 6.25 MG tablet  Commonly known as:  COREG  Take 1 tablet (6.25 mg total) by mouth 2 (two) times daily with a meal.     HYDROcodone-acetaminophen 5-325 MG per tablet  Commonly known as:  NORCO/VICODIN  Take 1 tablet by mouth every 6 (six) hours as needed for moderate pain or severe pain.     nitroGLYCERIN 0.4 MG SL tablet  Commonly known as:  NITROSTAT  Place 1 tablet (0.4 mg total) under the tongue every 5 (five) minutes as needed for chest pain (up to 3 doses).     ondansetron 8 MG disintegrating tablet  Commonly known as:  ZOFRAN-ODT  Take 8 mg by mouth every 4 (four) hours as needed for nausea. 8mg  ODT q4 hours prn nausea     pantoprazole 40 MG tablet  Commonly known as:  PROTONIX  Take 1 tablet (40 mg total) by mouth 2 (two) times daily.     simvastatin 20 MG tablet  Commonly known as:  ZOCOR  Take 20 mg by mouth at bedtime.     warfarin 5 MG tablet  Commonly known as:  COUMADIN  Take 2 tablets (10 mg total) by mouth one time only at 6 PM. Take 10mg  (2 tablets) today 5/22, 10mg  on 5/23, 7.5mg  (1.5 tablets) on 5/24, 7.5mg  on 5/25, 5mg  (1 tablet) on 5/26.        Disposition and follow-up:   [facility] [name] [facility].   Please address the following problems post-discharge:  1.Compliance with coumadin, check INR 2. Any further episodes of hematemesis 3. Follow-up with cardiology 4. Health maintenance  Labs / imaging needed at time of follow-up: INR, cbc, BMP  Pending labs/ test needing follow-up: None  Follow-up Appointments:     Follow-up Information   Follow up with Olga MillersBrian Crenshaw, MD On 08/09/2013. (at 4:15 pm)    Specialty:  Cardiology   Contact information:   [address] [place] [address] [phone]       Follow up with Gar PontoGroce III, James Boyd, [facility] On 08/10/2013. (2:00PM)    Specialty:  Pharmacist   Contact information:   [address] [place] [address] [phone]       Follow up with Christen BameSadek, Nora, MD On 08/15/2013. (10:30AM)    Specialty:  Internal Medicine   Contact information:   [address] [place] [address] [phone]       Discharge Instructions: Discharge Instructions   Call MD for:  difficulty breathing, headache or visual disturbances    Complete by:  As directed      Diet - low sodium heart healthy    Complete by:  As directed      Increase  activity slowly    Complete by:  As directed            Consultations:   Cardiology Gastroenterology  Procedures Performed:  Ct Abdomen Pelvis W Contrast  08/01/2013   CLINICAL DATA:  Mid abdominal pain. Nausea and vomiting. Possible pancreatitis.  EXAM: CT ABDOMEN AND PELVIS WITH CONTRAST  TECHNIQUE: Multidetector CT imaging of the abdomen and pelvis was performed using the standard protocol following bolus administration of intravenous contrast.  CONTRAST:  OMNIPAQUE IOHEXOL 300 MG/ML  SOLN  COMPARISON:  CT of the abdomen and pelvis 06/10/2011.  FINDINGS: Lung Bases: There is a small filling defect within the apex of the left ventricle, best appreciated on images 6-8 of series 2, suspicious for a left ventricular apical thrombus. There is some  myocardial thinning and low-attenuation throughout the left ventricular apex, which appears bowed slightly outward, suggesting fibrofatty remodeling related to prior left anterior descending territory myocardial infarction.  Abdomen/Pelvis: Pancreatic parenchyma enhances normally at this time. No focal pancreatic lesions. No pancreatic ductal dilatation. However, there is extensive peripancreatic stranding throughout the retroperitoneal fat, indicative of acute inflammation. No peripancreatic fluid collections are noted at this time to suggest pseudocyst formation. The splenic vein, superior mesenteric vein, splenoportal confluence and portal veins are all patent at this time.  Diffuse low attenuation throughout the hepatic parenchyma, suggestive of hepatic steatosis (difficult to say for certain on today's contrast-enhanced CT scan). No focal hepatic lesions are noted. The appearance of the gallbladder, spleen, bilateral adrenal glands and the right kidney is unremarkable. Sub cm low-attenuation lesion in the lower pole of the left kidney is too small to definitively characterize, but is statistically likely to represent a tiny cyst. No significant volume of ascites. No pneumoperitoneum. No pathologic distention of small bowel. No definite lymphadenopathy identified within the abdomen or pelvis. Normal appendix. Numerous colonic diverticulae are noted, particularly in the descending and sigmoid colon, without surrounding inflammatory changes to suggest an acute diverticulitis at this time. Uterus and bilateral ovaries are unremarkable in appearance. Urinary bladder is normal in appearance.  Musculoskeletal: There are no aggressive appearing lytic or blastic lesions noted in the visualized portions of the skeleton.  IMPRESSION: 1. Imaging findings are compatible with acute pancreatitis. No pancreatic pseudocyst or evidence of frank pancreatic necrosis is noted at this time. 2. Findings are highly suspicious for  potential left ventricular apical thrombus (likely related to prior distal LAD territory myocardial infarction). This places the patient at high risk for systemic embolization, and consultation with Cardiology and further evaluation with echocardiography is strongly recommended in the near future. 3. Diffuse low attenuation throughout the hepatic parenchyma, suggestive of hepatic steatosis. This difficult to confirm on today's contrast enhanced CT scan. 4. Colonic diverticulosis without findings to suggest acute diverticulitis at this time. 5. Additional incidental findings, as above. These results were called by telephone at the time of interpretation on 08/01/2013 at 9:56 PM to nurse Aggie Cosier for Dr. Aletta Edouard, who verbally acknowledged these results.   Electronically Signed   By: [name] M.D.   On: 08/01/2013 22:00    2D Echo: N/A Date: 08/02/2013 Conclusions: Left ventricle: The cavity size was normal. Systolic function was normal. The estimated ejection fraction was in the range of 55% to 60%. There is akinesis of the apical myocardium. Doppler parameters are consistent with abnormal left ventricular relaxation (grade 1 diastolic dysfunction). Acoustic contrast opacification revealed an apparent, small, 1.0 cm (L) x 0.5 cm (W), apicalthrombus.  Cardiac Cath: N/A  Admission HPI:  Lindsey [name] is a 43 y.o. female with a PMH CVA (possible ATIII deficiency), anxiety, CAD, HTN, MI. Pt presents to the ED with N/V and worsening abdominal pain since Friday. She began to feel bad on Friday with worsening LLQ abdominal pain radiating to her back with N/V (pt reports hematemisis). She denies any fever/chills, URI symptoms, CP, SOB, or dysuria. She reports she is on her menstrual period. She denies eating any different foods lately and no one around her has similar symptoms. She has been taking a lot of goody powder recently because that is the only thing that helps her with pain. She  reports having an EGD done which showed a hiatal hernia and a non-revealing colonoscopy. She denies any URI symptoms or h/o kidney stones. She has not had any gynecological issues. She still rates her pain 8/10 that is unrelieved with morphine. She denies regular alcohol use or recreational drug use.  Of note, she had a flare of pancreatitis previously and reports that her symptoms are not comparable to that episode.  ED course: In the ED, she was given dilaudid which helped her pain somewhat. She definitely says that morphine does not help her pain.   Hospital Course by problem list: Principal Problem:   Pancreatitis, acute Active Problems:   HYPERLIPIDEMIA-MIXED   HYPERTENSION   Antithrombin III deficiency   Pancreatitis   Apical mural thrombus   Hematemesis   Gastritis and duodenitis   Diverticulosis   Fatty liver   Esophagitis   Hiatal hernia   Acute pancreatitis  Pt presents with LLQ abdominal pain with N/V. Pain likely related to acute pancreatitis but other etiologies should also be considered. Lipase >3xULN (lipase 629).  Additional possibilities include: diverticulitis, nephrolithiasis, gynecology pathology. Pregnancy test negativ.   Acidosis likely due to ketoacidosis since ketonuria (pt reports decreased po intake). Lactic acid wnl.  Troponin x 1 neg. LP wnl. CT Abd/pelvis: imaging findings are c/w acute pancreatitis. No pancreatic pseudocyst or evidence of frank necrosis noted. Findings are suspicious for potential LV apical thrombus (likely related to prior distal LAD territory MI). Pt is at high risk for systemic embolization, and consultation with cardiology and further evaluation with echocardiography is strongly recommended. Diffuse low attenuation throughout the hepatic parenchyma, suggestive of hepatic steatosis which is difficult to confirm on today's scan. Colonic diverticulosis without findings to suggest acute diverticulitis at this time. Pt was kept NPO and started on  IVF 154ml/h and dilaudid for pancreatitis.  Cardiology consulted and got TTE which revealed LVEF:55-60% with grade 1 diastolic dysfunction with acoustic contrast opacification revealed an apparent small, 1.0cm x 0.5cm apical thrombus.  She was started on a heparing gtt with transition to coumadin per cardiology.  She should follow up with internal medicine clinic and cardiology.   Hematemesis  Hemoglobin stable even though has trended down. EGD 06/09/12: 2cm hiatal hernia. Consulted GI and EGD shows esophagitis, erosive gastritis, and duodenitis with small hiatal hernia again noted.  Recommend PPI po bid for 4 weeks then every morning long term.  Avoid NSAIDS, ETOH.  Recommend monitor for bleeding in next 4 -6 weeks.   Left ventricular apical thrombus  Pt on heparin gtt with transition to lovenox-->coumadin.  Will follow up with INR checks in [facility] clinic with Dr. Alexandria Lodge. Follow-up with  Groce on Wednesday to have INR checked.   h/o CAD  s/p anterior STEMI in 2000 with PCI of LAD; s/p PTCA and bare-metal stent to the LAD in 2009. s/p  cath on May 03, 2009, showing no obstructive disease with LVEF of 55%.  Discontinued ASA and plavix per GI.   Hypertension  Stable.   Elevated transaminases  Resolved.   h/o CVA/TIA  Trops x 1 neg.  Discontinued ASA and plavix per GI.  Continued statin and will be on coumadin long term for apical thrombus.   Dyslipidemia  Pt is on simvastatin 20mg  at home.  Continued statin.  Lab Results   Component  Value  Date    LDLCALC  85  08/01/2013     Discharge Vitals:   BP 150/98  Pulse 71  Temp(Src) 98.5 F (36.9 C) (Oral)  Resp 20  Ht 5\' 6"  (1.676 m)  Wt 182 lb 15.7 oz (83 kg)  BMI 29.55 kg/m2  SpO2 100%  Discharge Labs:  No results found for this or any previous visit (from the past 24 hour(s)).  Signed: Marrian Salvage, MD [phone] 08/16/2013, 9:20 PM   Time Spent on Discharge: 45 minutes Services Ordered on Discharge:  None Equipment  Ordered on Discharge: None

## 2013-08-17 MED ORDER — WARFARIN SODIUM 5 MG PO TABS: 7.5000 mg | ORAL_TABLET | Freq: Every day | ORAL | Status: DC

## 2013-08-17 NOTE — Discharge Summary (Signed)
Reviewed documentation - agree with Dr Shiela Mayer discharge planning note.

## 2013-08-17 NOTE — Addendum Note (Signed)
Addended by: Mliss Fritz on: 08/17/2013 11:57 AM   Modules accepted: Orders

## 2013-08-17 NOTE — Progress Notes (Signed)
Case discussed with Dr. Sadek at the time of the visit.  We reviewed the resident's history and exam and pertinent patient test results.  I agree with the assessment, diagnosis, and plan of care documented in the resident's note. 

## 2013-08-29 ENCOUNTER — Ambulatory Visit: Payer: Medicaid Other

## 2013-09-13 ENCOUNTER — Ambulatory Visit: Payer: Medicaid Other | Admitting: Nurse Practitioner

## 2013-10-13 ENCOUNTER — Ambulatory Visit (INDEPENDENT_AMBULATORY_CARE_PROVIDER_SITE_OTHER): Payer: Medicaid Other | Admitting: Pharmacist

## 2013-10-13 DIAGNOSIS — I513 Intracardiac thrombosis, not elsewhere classified: Secondary | ICD-10-CM

## 2013-10-13 DIAGNOSIS — Z7901 Long term (current) use of anticoagulants: Secondary | ICD-10-CM

## 2013-10-13 DIAGNOSIS — I2109 ST elevation (STEMI) myocardial infarction involving other coronary artery of anterior wall: Secondary | ICD-10-CM

## 2013-10-13 LAB — POCT INR: INR: 1

## 2013-10-13 MED ORDER — WARFARIN SODIUM 5 MG PO TABS: 7.5000 mg | ORAL_TABLET | Freq: Every day | ORAL | Status: DC

## 2013-10-13 NOTE — Progress Notes (Signed)
Anti-Coagulation Progress Note  Lindsey [name] is a 42 y.o. female who is currently on an anti-coagulation regimen.    RECENT RESULTS: Recent results are below, the most recent result is correlated with a dose of 52.5 mg. per week--but  Has not been taking this regimen having ran out of warfarin. Will re-fill today, see on 17-AUG-15 to re-evaluate this regimen while compliant: Lab Results  Component Value Date   INR 1.0 10/13/2013   INR 2.3 08/15/2013   INR 1.70 08/10/2013    ANTI-COAG DOSE: Anticoagulation Dose Instructions as of 10/13/2013     Glynis Smiles Tue Wed Thu Fri Sat   New Dose 7.5 mg 7.5 mg 7.5 mg 7.5 mg 7.5 mg 7.5 mg 7.5 mg       ANTICOAG SUMMARY: Anticoagulation Episode Summary   Current INR goal 2.0-3.0  Next INR check 10/31/2013  INR from last check 1.0! (10/13/2013)  Weekly max dose   Target end date   [facility]  Preferred lab   Send INR reminders to    Indications  Apical mural thrombus [410.10] Long term (current) use of anticoagulants [V58.61]        Comments         ANTICOAG TODAY: Anticoagulation Summary as of 10/13/2013   INR goal 2.0-3.0  Selected INR 1.0! (10/13/2013)  Next INR check 10/31/2013  Target end date    Indications  Apical mural thrombus [410.10] Long term (current) use of anticoagulants [V58.61]      Anticoagulation Episode Summary   [facility]   Preferred lab    Send INR reminders to    Comments       PATIENT INSTRUCTIONS: Patient Instructions  Patient instructed to take medications as defined in the Anti-coagulation Track section of this encounter.  Patient instructed to take today's dose.  Patient verbalized understanding of these instructions.       FOLLOW-UP Return in 3 weeks (on 10/31/2013) for Follow up INR at 2PM.  Hulen Luster, III Pharm.D., CACP

## 2013-10-13 NOTE — Patient Instructions (Signed)
Patient instructed to take medications as defined in the Anti-coagulation Track section of this encounter.  Patient instructed to take today's dose.  Patient verbalized understanding of these instructions.    

## 2013-10-13 NOTE — Progress Notes (Addendum)
Cardiology Office Note    Date:  10/14/2013   ID:  Lindsey, [name] 07/28/1970, MRN [id number]  PCP:  [name], MD  Cardiologist:  Dr. Valera Castle >>> Dr. Olga Millers      History of Present Illness: Lindsey [name] is a 43 y.o. female with a hx of CAD s/p apical infarct in 2000 and NSTEMI in 2009 tx with BMS to LAD, prior CVA, ? Anti-thrombin III deficiency, HTN, HL.  Admitted in 07/2013 with pancreatitis.  Echo demonstrated apical mural thrombus.  She was placed on coumadin.  ASA and Plavix were d/c'd.  She returns for follow up.  Coumadin is managed in the [facility] clinic.    She is doing well.  The patient denies chest pain, shortness of breath, syncope, orthopnea, PND or significant pedal edema. She is not taking any medications aside from Warfarin.  Missed a couple days recently and INR was 1.0 earlier this week.     Studies:  - LHC (12/2011):  pLAD stent ok with 30-40%, mLAD with myocardial bridging, EF 55%, apical akinesis with apical aneurysm (no change)  >>> med Rx.   - Echo (5/15):  EF 55-60%, apical AK, Gr 1 DD, small 1 x 0.5 cm apical thrombus    Recent Labs/Images: 08/01/2013: HDL Cholesterol by NMR 76; LDL (calc) 85  08/02/2013: ALT 29  08/15/2013: Creatinine 0.59; Hemoglobin 9.6*; Potassium 3.9; TSH 1.483   Ct Abdomen Pelvis W Contrast   08/01/2013    IMPRESSION: 1. Imaging findings are compatible with acute pancreatitis. No pancreatic pseudocyst or evidence of frank pancreatic necrosis is noted at this time. 2. Findings are highly suspicious for potential left ventricular apical thrombus (likely related to prior distal LAD territory myocardial infarction). This places the patient at high risk for systemic embolization, and consultation with Cardiology and further evaluation with echocardiography is strongly recommended in the near future. 3. Diffuse low attenuation throughout the hepatic parenchyma, suggestive of hepatic steatosis. This difficult to confirm on  today's contrast enhanced CT scan. 4. Colonic diverticulosis without findings to suggest acute diverticulitis at this time. 5. Additional incidental findings, as above. These results were called by telephone at the time of interpretation on 08/01/2013 at 9:56 PM to nurse Aggie Cosier for Dr. Aletta Edouard, who verbally acknowledged these results.   Electronically Signed   By: [name] M.D.   On: 08/01/2013 22:00     Wt Readings from Last 3 Encounters:  10/14/13 168 lb (76.204 kg)  08/15/13 180 lb 14.4 oz (82.056 kg)  08/05/13 182 lb 15.7 oz (83 kg)     Past Medical History  Diagnosis Date  . Stroke     assoc with short term memory loss and right peripheral vision loss; age 52   . Blood transfusion   . Anxiety   . Coronary artery disease     Apical LAD infarction '00; NSTEMI s/p BMS to prox LAD '09; Cath 12/2011 single vessel CAD w/ patent LAD stent w/ stable mild ISR, nl LV systolic fxn  . Anemia   . Antithrombin III deficiency     ?pt not sure if true diagnosis  . Tobacco abuse   . High blood pressure   . Myocardial infarction     Current Outpatient Prescriptions  Medication Sig Dispense Refill  . nitroGLYCERIN (NITROSTAT) 0.4 MG SL tablet Place 1 tablet (0.4 mg total) under the tongue every 5 (five) minutes as needed for chest pain (up to 3 doses).  25 tablet  3  . warfarin (COUMADIN) 5 MG tablet Take 1.5 tablets (7.5 mg total) by mouth daily.  30 tablet  0  . carvedilol (COREG) 6.25 MG tablet Take 1 tablet (6.25 mg total) by mouth 2 (two) times daily with a meal.  60 tablet  6  . HYDROcodone-acetaminophen (NORCO/VICODIN) 5-325 MG per tablet Take 1 tablet by mouth every 6 (six) hours as needed for moderate pain or severe pain.  8 tablet  0  . ondansetron (ZOFRAN-ODT) 8 MG disintegrating tablet Take 8 mg by mouth every 4 (four) hours as needed for nausea. 8mg  ODT q4 hours prn nausea      . pantoprazole (PROTONIX) 40 MG tablet Take 1 tablet (40 mg total) by mouth 2 (two) times  daily.  60 tablet  0  . sertraline (ZOLOFT) 25 MG tablet Take 1 tablet (25 mg total) by mouth daily.  30 tablet  2  . simvastatin (ZOCOR) 20 MG tablet Take 20 mg by mouth at bedtime.       No current facility-administered medications for this visit.     Allergies:   Review of patient's allergies indicates no known allergies.   Social History:  The patient  reports that she has quit smoking. Her smoking use included Cigarettes. She has a .2 pack-year smoking history. She has never used smokeless tobacco. She reports that she drinks alcohol. She reports that she does not use illicit drugs.   Family History:  The patient's family history includes Breast cancer in her maternal aunt; Heart disease in her brother.   ROS:  Please see the history of present illness.      All other systems reviewed and negative.   PHYSICAL EXAM: VS:  BP 122/90  Pulse 97  Ht 5' 6.5" (1.689 m)  Wt 168 lb (76.204 kg)  BMI 26.71 kg/m2 Well nourished, well developed, in no acute distress HEENT: normal Neck: no JVD Cardiac:  normal S1, S2; RRR; no murmur Lungs:  clear to auscultation bilaterally, no wheezing, rhonchi or rales Abd: soft, nontender, no hepatomegaly Ext: no edema Skin: warm and dry Neuro:  CNs 2-12 intact, no focal abnormalities noted  EKG:  NSR, HR 97, normal axis, no ST changes     ASSESSMENT AND PLAN:  Apical mural thrombus:  Continue coumadin.  We discussed the importance of compliance with coumadin.  This is followed at the IM clinic.  Atherosclerosis of native coronary artery of native heart without angina pectoris:  No angina.  She was taken off of Plavix and ASA as she is now on coumadin.  Resume statin (simvastatin 20 QHS).  Resume beta blocker (Toprol-XL 25 QD).    HYPERTENSION:  Start Toprol as noted.   HYPERLIPIDEMIA-MIXED:  Resume simvastatin 20 QHS.  Check Lipids and LFTs in 8 weeks.     Disposition:  F/u with Dr. Olga MillersBrian Crenshaw in 3 mos.    Signed, [name] Tyrease Vandeberg, PA-C,  [facility] 10/14/2013 11:26 AM    [facility] [address], [place], [place]  [address] Phone: [phone]; Fax: [phone]    Addendum:  Reviewed with Dr. Olga MillersBrian Crenshaw.  Will keep her off of ASA. Tereso NewcomerScott Trevaris Pennella, PA-C   10/17/2013 5:26 PM

## 2013-10-14 ENCOUNTER — Ambulatory Visit (INDEPENDENT_AMBULATORY_CARE_PROVIDER_SITE_OTHER): Payer: Medicaid Other | Admitting: Physician Assistant

## 2013-10-14 ENCOUNTER — Encounter: Payer: Self-pay | Admitting: Physician Assistant

## 2013-10-14 VITALS — BP 122/90 | HR 97 | Ht 66.5 in | Wt 168.0 lb

## 2013-10-14 DIAGNOSIS — I2510 Atherosclerotic heart disease of native coronary artery without angina pectoris: Secondary | ICD-10-CM

## 2013-10-14 DIAGNOSIS — I10 Essential (primary) hypertension: Secondary | ICD-10-CM

## 2013-10-14 DIAGNOSIS — I513 Intracardiac thrombosis, not elsewhere classified: Secondary | ICD-10-CM

## 2013-10-14 DIAGNOSIS — I2109 ST elevation (STEMI) myocardial infarction involving other coronary artery of anterior wall: Secondary | ICD-10-CM

## 2013-10-14 DIAGNOSIS — E785 Hyperlipidemia, unspecified: Secondary | ICD-10-CM

## 2013-10-14 MED ORDER — METOPROLOL SUCCINATE ER 25 MG PO TB24: 25.0000 mg | ORAL_TABLET | Freq: Every day | ORAL | Status: DC

## 2013-10-14 MED ORDER — SIMVASTATIN 20 MG PO TABS: 20.0000 mg | ORAL_TABLET | Freq: Every day | ORAL | Status: DC

## 2013-10-14 MED ORDER — NITROGLYCERIN 0.4 MG SL SUBL: 0.4000 mg | SUBLINGUAL_TABLET | SUBLINGUAL | Status: DC | PRN

## 2013-10-14 NOTE — Patient Instructions (Signed)
START TOPROL XL 25 MG 1 TABLET DAILY; RX SENT IN  RE-START SIMVASTATIN 20 MG 1 TABLET EVERY NIGHT  AN RX FOR NITROGLYCERIN HAS BEEN SENT IN FOR YOU TODAY  Your physician recommends that you return for lab work in: 2 MONTHS FOR FASTING LIPID AND LIVER PANEL  Your physician recommends that you schedule a follow-up appointment in: 3 MONTHS WITH DR. CRENSHAW

## 2013-10-17 NOTE — Progress Notes (Signed)
Patient on anticoagulation for apical mural thrombus.  Not taking coumadin due to running out.  Restarted as per Dr. Saralyn Pilar note.

## 2013-10-18 ENCOUNTER — Encounter: Payer: Medicaid Other | Admitting: Internal Medicine

## 2013-10-19 ENCOUNTER — Encounter: Payer: Self-pay | Admitting: Pharmacist

## 2013-10-19 ENCOUNTER — Other Ambulatory Visit: Payer: Self-pay | Admitting: Family Medicine

## 2013-10-19 DIAGNOSIS — Z7901 Long term (current) use of anticoagulants: Secondary | ICD-10-CM

## 2013-10-19 DIAGNOSIS — I513 Intracardiac thrombosis, not elsewhere classified: Secondary | ICD-10-CM

## 2013-10-19 MED ORDER — WARFARIN SODIUM 5 MG PO TABS: 7.5000 mg | ORAL_TABLET | Freq: Every day | ORAL | Status: DC

## 2013-10-20 NOTE — Progress Notes (Addendum)
Information only, adherence concern: patient reports not taking warfarin for 7 days due to running out. Patient now has supply of medication filled.

## 2013-10-30 ENCOUNTER — Emergency Department (HOSPITAL_COMMUNITY): Payer: Medicaid Other

## 2013-10-30 ENCOUNTER — Emergency Department (HOSPITAL_COMMUNITY)
Admission: EM | Admit: 2013-10-30 | Discharge: 2013-10-30 | Disposition: A | Discharge status: Home / Self Care | Payer: Medicaid Other | Attending: Emergency Medicine | Admitting: Emergency Medicine

## 2013-10-30 ENCOUNTER — Encounter (HOSPITAL_COMMUNITY): Payer: Self-pay | Admitting: Emergency Medicine

## 2013-10-30 DIAGNOSIS — Z9889 Other specified postprocedural states: Secondary | ICD-10-CM | POA: Insufficient documentation

## 2013-10-30 DIAGNOSIS — Z862 Personal history of diseases of the blood and blood-forming organs and certain disorders involving the immune mechanism: Secondary | ICD-10-CM | POA: Insufficient documentation

## 2013-10-30 DIAGNOSIS — Y9389 Activity, other specified: Secondary | ICD-10-CM | POA: Diagnosis not present

## 2013-10-30 DIAGNOSIS — F411 Generalized anxiety disorder: Secondary | ICD-10-CM | POA: Insufficient documentation

## 2013-10-30 DIAGNOSIS — I2510 Atherosclerotic heart disease of native coronary artery without angina pectoris: Secondary | ICD-10-CM | POA: Insufficient documentation

## 2013-10-30 DIAGNOSIS — Z7982 Long term (current) use of aspirin: Secondary | ICD-10-CM | POA: Diagnosis not present

## 2013-10-30 DIAGNOSIS — I252 Old myocardial infarction: Secondary | ICD-10-CM | POA: Diagnosis not present

## 2013-10-30 DIAGNOSIS — F10929 Alcohol use, unspecified with intoxication, unspecified: Secondary | ICD-10-CM

## 2013-10-30 DIAGNOSIS — Z9861 Coronary angioplasty status: Secondary | ICD-10-CM | POA: Diagnosis not present

## 2013-10-30 DIAGNOSIS — Z87891 Personal history of nicotine dependence: Secondary | ICD-10-CM | POA: Insufficient documentation

## 2013-10-30 DIAGNOSIS — T07XXXA Unspecified multiple injuries, initial encounter: Secondary | ICD-10-CM | POA: Diagnosis not present

## 2013-10-30 DIAGNOSIS — I10 Essential (primary) hypertension: Secondary | ICD-10-CM | POA: Insufficient documentation

## 2013-10-30 DIAGNOSIS — F1010 Alcohol abuse, uncomplicated: Secondary | ICD-10-CM | POA: Diagnosis not present

## 2013-10-30 DIAGNOSIS — Z7901 Long term (current) use of anticoagulants: Secondary | ICD-10-CM | POA: Insufficient documentation

## 2013-10-30 DIAGNOSIS — Y92410 Unspecified street and highway as the place of occurrence of the external cause: Secondary | ICD-10-CM | POA: Diagnosis not present

## 2013-10-30 DIAGNOSIS — IMO0002 Reserved for concepts with insufficient information to code with codable children: Secondary | ICD-10-CM | POA: Diagnosis present

## 2013-10-30 DIAGNOSIS — F1410 Cocaine abuse, uncomplicated: Secondary | ICD-10-CM | POA: Insufficient documentation

## 2013-10-30 DIAGNOSIS — Z8673 Personal history of transient ischemic attack (TIA), and cerebral infarction without residual deficits: Secondary | ICD-10-CM | POA: Insufficient documentation

## 2013-10-30 DIAGNOSIS — Z23 Encounter for immunization: Secondary | ICD-10-CM | POA: Diagnosis not present

## 2013-10-30 LAB — COMPREHENSIVE METABOLIC PANEL
ALT: 20 U/L (ref 0–35)
ANION GAP: 12 (ref 5–15)
AST: 42 U/L — AB (ref 0–37)
Albumin: 3.2 g/dL — ABNORMAL LOW (ref 3.5–5.2)
Alkaline Phosphatase: 112 U/L (ref 39–117)
BUN: 5 mg/dL — AB (ref 6–23)
CALCIUM: 8.7 mg/dL (ref 8.4–10.5)
CO2: 24 meq/L (ref 19–32)
CREATININE: 0.67 mg/dL (ref 0.50–1.10)
Chloride: 106 mEq/L (ref 96–112)
GFR calc Af Amer: >90 mL/min (ref >90)
Glucose, Bld: 98 mg/dL (ref 70–99)
Potassium: 3.8 mEq/L (ref 3.7–5.3)
Sodium: 142 mEq/L (ref 137–147)
Total Protein: 7.1 g/dL (ref 6.0–8.3)

## 2013-10-30 LAB — CBC WITH DIFFERENTIAL/PLATELET
Basophils Absolute: 0.1 10*3/uL (ref 0.0–0.1)
Basophils Relative: 1 % (ref 0–1)
EOS PCT: 1 % (ref 0–5)
Eosinophils Absolute: 0.1 10*3/uL (ref 0.0–0.7)
HEMATOCRIT: 32 % — AB (ref 36.0–46.0)
Hemoglobin: 10.2 g/dL — ABNORMAL LOW (ref 12.0–15.0)
LYMPHS ABS: 1.3 10*3/uL (ref 0.7–4.0)
Lymphocytes Relative: 18 % (ref 12–46)
MCH: 28.3 pg (ref 26.0–34.0)
MCHC: 31.9 g/dL (ref 30.0–36.0)
MCV: 88.6 fL (ref 78.0–100.0)
MONO ABS: 0.5 10*3/uL (ref 0.1–1.0)
Monocytes Relative: 7 % (ref 3–12)
NEUTROS ABS: 5.4 10*3/uL (ref 1.7–7.7)
Neutrophils Relative %: 73 % (ref 43–77)
Platelets: 181 10*3/uL (ref 150–400)
RBC: 3.61 MIL/uL — ABNORMAL LOW (ref 3.87–5.11)
RDW: 21.2 % — AB (ref 11.5–15.5)
WBC: 7.4 10*3/uL (ref 4.0–10.5)

## 2013-10-30 LAB — URINALYSIS, ROUTINE W REFLEX MICROSCOPIC
Bilirubin Urine: NEGATIVE
GLUCOSE, UA: NEGATIVE mg/dL
HGB URINE DIPSTICK: NEGATIVE
Ketones, ur: NEGATIVE mg/dL
LEUKOCYTES UA: NEGATIVE
Nitrite: NEGATIVE
PH: 5.5 (ref 5.0–8.0)
Protein, ur: NEGATIVE mg/dL
SPECIFIC GRAVITY, URINE: 1.02 (ref 1.005–1.030)
Urobilinogen, UA: 0.2 mg/dL (ref 0.0–1.0)

## 2013-10-30 LAB — PROTIME-INR
INR: 1 (ref 0.00–1.49)
PROTHROMBIN TIME: 13.2 s (ref 11.6–15.2)

## 2013-10-30 LAB — RAPID URINE DRUG SCREEN, HOSP PERFORMED
Amphetamines: NOT DETECTED
Barbiturates: NOT DETECTED
Benzodiazepines: NOT DETECTED
Cocaine: POSITIVE — AB
OPIATES: NOT DETECTED
Tetrahydrocannabinol: NOT DETECTED

## 2013-10-30 LAB — ETHANOL: Alcohol, Ethyl (B): 310 mg/dL — ABNORMAL HIGH (ref 0–11)

## 2013-10-30 MED ORDER — FENTANYL CITRATE 0.05 MG/ML IJ SOLN
100.0000 ug | Freq: Once | INTRAMUSCULAR | Status: AC
  Administered 2013-10-30: 100 ug via INTRAVENOUS
  Filled 2013-10-30: qty 2

## 2013-10-30 MED ORDER — SODIUM CHLORIDE 0.9 % IV SOLN
INTRAVENOUS | Status: DC
  Administered 2013-10-30: 14:00:00 via INTRAVENOUS

## 2013-10-30 MED ORDER — ONDANSETRON HCL 4 MG/2ML IJ SOLN
4.0000 mg | Freq: Once | INTRAMUSCULAR | Status: AC
  Administered 2013-10-30: 4 mg via INTRAVENOUS
  Filled 2013-10-30: qty 2

## 2013-10-30 MED ORDER — IOHEXOL 300 MG/ML  SOLN
100.0000 mL | Freq: Once | INTRAMUSCULAR | Status: AC | PRN
  Administered 2013-10-30: 100 mL via INTRAVENOUS

## 2013-10-30 MED ORDER — TETANUS-DIPHTH-ACELL PERTUSSIS 5-2.5-18.5 LF-MCG/0.5 IM SUSP
0.5000 mL | Freq: Once | INTRAMUSCULAR | Status: AC
  Administered 2013-10-30: 0.5 mL via INTRAMUSCULAR
  Filled 2013-10-30: qty 0.5

## 2013-10-30 NOTE — ED Notes (Signed)
Pt out of room for CT 

## 2013-10-30 NOTE — ED Notes (Signed)
MD at bedside. EDP WENTZ PRESENT TO RE EVALUATE PT

## 2013-10-30 NOTE — ED Notes (Addendum)
Patient called out for help to use the restroom, due to her having difficulties getting out the bed. Lindsey [name], [place] and myself went in to assist her to the restroom. Patient immediately started yelling at staff (What! yall mother fuckers are not coming in here, like yall the mother fucker police..... [facility] or [place]) We all tried to calm the patient down by offering her assistance and telling her that we are here for her and we all trying to help her. Patient continued to yell and be belligerent.
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
## 2013-10-30 NOTE — Discharge Instructions (Signed)
Use Tylenol, if needed, for pain. Your alcohol level is very high today. Do not drive on your drinking alcohol. Avoid cocaine. Seek treatment for substance abuse, by going to AA, talking to her doctor or another appropriate resource to help avoid these substances.     Contusion A contusion is a deep bruise. Contusions are the result of an injury that caused bleeding under the skin. The contusion may turn blue, purple, or yellow. Minor injuries will give you a painless contusion, but more severe contusions may stay painful and swollen for a few weeks.  CAUSES  A contusion is usually caused by a blow, trauma, or direct force to an area of the body. SYMPTOMS   Swelling and redness of the injured area.  Bruising of the injured area.  Tenderness and soreness of the injured area.  Pain. DIAGNOSIS  The diagnosis can be made by taking a history and physical exam. An X-ray, CT scan, or MRI may be needed to determine if there were any associated injuries, such as fractures. TREATMENT  Specific treatment will depend on what area of the body was injured. In general, the best treatment for a contusion is resting, icing, elevating, and applying cold compresses to the injured area. Over-the-counter medicines may also be recommended for pain control. Ask your caregiver what the best treatment is for your contusion. HOME CARE INSTRUCTIONS   Put ice on the injured area.  Put ice in a plastic bag.  Place a towel between your skin and the bag.  Leave the ice on for 15-20 minutes, 3-4 times a day, or as directed by your health care provider.  Only take over-the-counter or prescription medicines for pain, discomfort, or fever as directed by your caregiver. Your caregiver may recommend avoiding anti-inflammatory medicines (aspirin, ibuprofen, and naproxen) for 48 hours because these medicines may increase bruising.  Rest the injured area.  If possible, elevate the injured area to reduce  swelling. SEEK IMMEDIATE MEDICAL CARE IF:   You have increased bruising or swelling.  You have pain that is getting worse.  Your swelling or pain is not relieved with medicines. MAKE SURE YOU:   Understand these instructions.  Will watch your condition.  Will get help right away if you are not doing well or get worse. Document Released: 12/11/2004 Document Revised: 03/08/2013 Document Reviewed: 01/06/2011 [facility] Patient Information 2015 [place], [place]. This information is not intended to replace advice given to you by your health care provider. Make sure you discuss any questions you have with your health care provider.  Motor Vehicle Collision It is common to have multiple bruises and sore muscles after a motor vehicle collision (MVC). These tend to feel worse for the first 24 hours. You may have the most stiffness and soreness over the first several hours. You may also feel worse when you wake up the first morning after your collision. After this point, you will usually begin to improve with each day. The speed of improvement often depends on the severity of the collision, the number of injuries, and the location and nature of these injuries. HOME CARE INSTRUCTIONS  Put ice on the injured area.  Put ice in a plastic bag.  Place a towel between your skin and the bag.  Leave the ice on for 15-20 minutes, 3-4 times a day, or as directed by your health care provider.  Drink enough fluids to keep your urine clear or pale yellow. Do not drink alcohol.  Take a warm shower or bath  once or twice a day. This will increase blood flow to sore muscles.  You may return to activities as directed by your caregiver. Be careful when lifting, as this may aggravate neck or back pain.  Only take over-the-counter or prescription medicines for pain, discomfort, or fever as directed by your caregiver. Do not use aspirin. This may increase bruising and bleeding. SEEK IMMEDIATE MEDICAL CARE  IF:  You have numbness, tingling, or weakness in the arms or legs.  You develop severe headaches not relieved with medicine.  You have severe neck pain, especially tenderness in the middle of the back of your neck.  You have changes in bowel or bladder control.  There is increasing pain in any area of the body.  You have shortness of breath, light-headedness, dizziness, or fainting.  You have chest pain.  You feel sick to your stomach (nauseous), throw up (vomit), or sweat.  You have increasing abdominal discomfort.  There is blood in your urine, stool, or vomit.  You have pain in your shoulder (shoulder strap areas).  You feel your symptoms are getting worse. MAKE SURE YOU:  Understand these instructions.  Will watch your condition.  Will get help right away if you are not doing well or get worse. Document Released: 03/03/2005 Document Revised: 07/18/2013 Document Reviewed: 07/31/2010 [facility] Patient Information 2015 [place], [place]. This information is not intended to replace advice given to you by your health care provider. Make sure you discuss any questions you have with your health care provider.  Alcohol Intoxication Alcohol intoxication occurs when the amount of alcohol that a person has consumed impairs his or her ability to mentally and physically function. Alcohol directly impairs the normal chemical activity of the brain. Drinking large amounts of alcohol can lead to changes in mental function and behavior, and it can cause many physical effects that can be harmful.  Alcohol intoxication can range in severity from mild to very severe. Various factors can affect the level of intoxication that occurs, such as the person's age, gender, weight, frequency of alcohol consumption, and the presence of other medical conditions (such as diabetes, seizures, or heart conditions). Dangerous levels of alcohol intoxication may occur when people drink large amounts of alcohol in a  short period (binge drinking). Alcohol can also be especially dangerous when combined with certain prescription medicines or "recreational" drugs. SIGNS AND SYMPTOMS Some common signs and symptoms of mild alcohol intoxication include:  Loss of coordination.  Changes in mood and behavior.  Impaired judgment.  Slurred speech. As alcohol intoxication progresses to more severe levels, other signs and symptoms will appear. These may include:  Vomiting.  Confusion and impaired memory.  Slowed breathing.  Seizures.  Loss of consciousness. DIAGNOSIS  Your health care provider will take a medical history and perform a physical exam. You will be asked about the amount and type of alcohol you have consumed. Blood tests will be done to measure the concentration of alcohol in your blood. In many places, your blood alcohol level must be lower than 80 mg/dL (1.61%0.08%) to legally drive. However, many dangerous effects of alcohol can occur at much lower levels.  TREATMENT  People with alcohol intoxication often do not require treatment. Most of the effects of alcohol intoxication are temporary, and they go away as the alcohol naturally leaves the body. Your health care provider will monitor your condition until you are stable enough to go home. Fluids are sometimes given through an IV access tube to help prevent dehydration.  HOME CARE INSTRUCTIONS  Do not drive after drinking alcohol.  Stay hydrated. Drink enough water and fluids to keep your urine clear or pale yellow. Avoid caffeine.   Only take over-the-counter or prescription medicines as directed by your health care provider.  SEEK MEDICAL CARE IF:   You have persistent vomiting.   You do not feel better after a few days.  You have frequent alcohol intoxication. Your health care provider can help determine if you should see a substance use treatment counselor. SEEK IMMEDIATE MEDICAL CARE IF:   You become shaky or tremble when you try  to stop drinking.   You shake uncontrollably (seizure).   You throw up (vomit) blood. This may be bright red or may look like black coffee grounds.   You have blood in your stool. This may be bright red or may appear as a black, tarry, bad smelling stool.   You become lightheaded or faint.  MAKE SURE YOU:   Understand these instructions.  Will watch your condition.  Will get help right away if you are not doing well or get worse. Document Released: 12/11/2004 Document Revised: 11/03/2012 Document Reviewed: 08/06/2012 [facility] Patient Information 2015 [place], [place]. This information is not intended to replace advice given to you by your health care provider. Make sure you discuss any questions you have with your health care provider.
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
## 2013-10-30 NOTE — ED Notes (Addendum)
Family at bedside. 

## 2013-10-31 ENCOUNTER — Ambulatory Visit: Payer: Medicaid Other

## 2013-11-07 ENCOUNTER — Ambulatory Visit: Payer: Medicaid Other

## 2013-11-15 ENCOUNTER — Encounter: Payer: Medicaid Other | Admitting: Internal Medicine

## 2013-11-15 ENCOUNTER — Encounter: Payer: Self-pay | Admitting: Internal Medicine

## 2013-11-24 ENCOUNTER — Encounter: Payer: Self-pay | Admitting: Internal Medicine

## 2013-12-14 ENCOUNTER — Other Ambulatory Visit: Payer: Medicaid Other

## 2014-01-24 NOTE — Progress Notes (Signed)
HPI: FU CAD; s/p apical infarct in 2000 and NSTEMI in 2009 tx with BMS to LAD, prior CVA, ? Anti-thrombin III deficiency, HTN, HL. Admitted in 07/2013 with pancreatitis. Echo demonstrated apical mural thrombus. Placed on coumadin. ASA and Plavix were d/c'd. Since last seen, She has mild dyspnea on exertion but no orthopnea, PND, pedal edema, syncope or chest pain. She is complaining of palpitations described as skipping.   Studies: - LHC (12/2011): pLAD stent ok with 30-40%, mLAD with myocardial bridging, EF 55%, apical akinesis with apical aneurysm (no change) >>> med Rx.  - Echo (5/15): EF 55-60%, apical AK, Gr 1 DD, small 1 x 0.5 cm apical thrombus   Current Outpatient Prescriptions  Medication Sig Dispense Refill  . aspirin EC 81 MG tablet Take 81 mg by mouth daily.    . nitroGLYCERIN (NITROSTAT) 0.4 MG SL tablet Place 1 tablet (0.4 mg total) under the tongue every 5 (five) minutes as needed for chest pain (up to 3 doses). 25 tablet 3  . sertraline (ZOLOFT) 50 MG tablet Take 50 mg by mouth daily.    Marland Kitchen. warfarin (COUMADIN) 5 MG tablet Take 1.5 tablets (7.5 mg total) by mouth daily. 42 tablet 0   No current facility-administered medications for this visit.     Past Medical History  Diagnosis Date  . Stroke     assoc with short term memory loss and right peripheral vision loss; age 43   . Blood transfusion   . Anxiety   . Coronary artery disease     Apical LAD infarction '00; NSTEMI s/p BMS to prox LAD '09; Cath 12/2011 single vessel CAD w/ patent LAD stent w/ stable mild ISR, nl LV systolic fxn  . Anemia   . Antithrombin III deficiency     ?pt not sure if true diagnosis  . Tobacco abuse   . High blood pressure   . Myocardial infarction     Past Surgical History  Procedure Laterality Date  . Tubal ligation    . Cardiac catheterization    . Esophagogastroduodenoscopy N/A 06/09/2012    Procedure: ESOPHAGOGASTRODUODENOSCOPY (EGD);  Surgeon: Theda BelfastPatrick D Hung, MD;   [facility];  Service: Endoscopy;  Laterality: N/A;  . Coronary angioplasty    . Esophagogastroduodenoscopy N/A 08/02/2013    Procedure: ESOPHAGOGASTRODUODENOSCOPY (EGD);  Surgeon: Meryl DareMalcolm T Stark, MD;  [facility];  Service: Endoscopy;  Laterality: N/A;    History   Social History  . Marital Status: Divorced    Spouse Name: N/A    Number of Children: 2  . Years of Education: N/A   Occupational History  . Not on file.   Social History Main Topics  . Smoking status: Former Smoker -- 0.20 packs/day for 1 years    Types: Cigarettes  . Smokeless tobacco: Never Used  . Alcohol Use: 0.0 oz/week    0 Cans of beer per week     Comment: occasion  . Drug Use: Yes    Special: Cocaine  . Sexual Activity: Yes    Birth Control/ Protection: None   Other Topics Concern  . Not on file   Social History Narrative    ROS: no fevers or chills, productive cough, hemoptysis, dysphasia, odynophagia, melena, hematochezia, dysuria, hematuria, rash, seizure activity, orthopnea, PND, pedal edema, claudication. Remaining systems are negative.  Physical Exam: Well-developed well-nourished in no acute distress.  Skin is warm and dry.  HEENT is normal.  Neck is supple.  Chest is clear to  auscultation with normal expansion.  Cardiovascular exam is regular rate and rhythm.  Abdominal exam nontender or distended. No masses palpated. Extremities show no edema. neuro grossly intact  ECG 10/14/2013-sinus rhythm, septal infarct.

## 2014-01-27 ENCOUNTER — Ambulatory Visit (INDEPENDENT_AMBULATORY_CARE_PROVIDER_SITE_OTHER): Payer: Medicaid Other | Admitting: Cardiology

## 2014-01-27 ENCOUNTER — Encounter: Payer: Self-pay | Admitting: Cardiology

## 2014-01-27 VITALS — BP 120/80 | HR 96 | Ht 66.0 in | Wt 164.5 lb

## 2014-01-27 DIAGNOSIS — R002 Palpitations: Secondary | ICD-10-CM

## 2014-01-27 DIAGNOSIS — I513 Intracardiac thrombosis, not elsewhere classified: Secondary | ICD-10-CM

## 2014-01-27 DIAGNOSIS — I2109 ST elevation (STEMI) myocardial infarction involving other coronary artery of anterior wall: Secondary | ICD-10-CM

## 2014-01-27 DIAGNOSIS — I2510 Atherosclerotic heart disease of native coronary artery without angina pectoris: Secondary | ICD-10-CM

## 2014-01-27 MED ORDER — ATORVASTATIN CALCIUM 40 MG PO TABS: 40.0000 mg | ORAL_TABLET | Freq: Every day | ORAL | Status: DC

## 2014-01-27 NOTE — Assessment & Plan Note (Signed)
Scheduled [facility] to further assess.

## 2014-01-27 NOTE — Assessment & Plan Note (Signed)
Not on aspirin given need for Coumadin. Add statin. Begin Lipitor 40 mg daily. Check lipids, liver and CK in 4 weeks.

## 2014-01-27 NOTE — Assessment & Plan Note (Signed)
Possible anti-thrombin 3 deficiencyDocumented previously. Continue Coumadin.

## 2014-01-27 NOTE — Assessment & Plan Note (Signed)
Continue Coumadin. Given prior CVA she will require lifelong anticoagulation.

## 2014-01-27 NOTE — Patient Instructions (Signed)
Your physician recommends that you schedule a follow-up appointment in: 8 WEEKS WITH DR Jens Som  Your physician has recommended that you wear an event monitor. Event monitors are medical devices that record the heart's electrical activity. Doctors most often [place] these monitors to diagnose arrhythmias. Arrhythmias are problems with the speed or rhythm of the heartbeat. The monitor is a small, portable device. You can wear one while you do your normal daily activities. This is usually used to diagnose what is causing palpitations/syncope (passing out).   START ATORVASTATIN 40 MG ONCE DAILY  Your physician recommends that you return for lab work in: 4 WEEKS=DO NOT EAT PRIOR TO LAB WORK

## 2014-01-27 NOTE — Assessment & Plan Note (Signed)
Begin statin. Begin Lipitor 40 mg daily and check lipids, liver and CK in 4 weeks.

## 2014-02-19 ENCOUNTER — Observation Stay (HOSPITAL_COMMUNITY)
Admission: EM | Admit: 2014-02-19 | Discharge: 2014-02-22 | Disposition: A | Discharge status: Home / Self Care | Payer: Medicaid Other | Attending: Internal Medicine | Admitting: Internal Medicine

## 2014-02-19 ENCOUNTER — Emergency Department (HOSPITAL_COMMUNITY): Payer: Medicaid Other

## 2014-02-19 ENCOUNTER — Encounter (HOSPITAL_COMMUNITY): Payer: Self-pay | Admitting: Emergency Medicine

## 2014-02-19 DIAGNOSIS — Z8673 Personal history of transient ischemic attack (TIA), and cerebral infarction without residual deficits: Secondary | ICD-10-CM | POA: Diagnosis not present

## 2014-02-19 DIAGNOSIS — D6859 Other primary thrombophilia: Secondary | ICD-10-CM | POA: Diagnosis not present

## 2014-02-19 DIAGNOSIS — I10 Essential (primary) hypertension: Secondary | ICD-10-CM | POA: Diagnosis not present

## 2014-02-19 DIAGNOSIS — Z9861 Coronary angioplasty status: Secondary | ICD-10-CM

## 2014-02-19 DIAGNOSIS — F419 Anxiety disorder, unspecified: Secondary | ICD-10-CM | POA: Insufficient documentation

## 2014-02-19 DIAGNOSIS — Z7982 Long term (current) use of aspirin: Secondary | ICD-10-CM | POA: Insufficient documentation

## 2014-02-19 DIAGNOSIS — R200 Anesthesia of skin: Secondary | ICD-10-CM | POA: Diagnosis present

## 2014-02-19 DIAGNOSIS — I63131 Cerebral infarction due to embolism of right carotid artery: Secondary | ICD-10-CM

## 2014-02-19 DIAGNOSIS — R51 Headache: Secondary | ICD-10-CM | POA: Diagnosis not present

## 2014-02-19 DIAGNOSIS — I252 Old myocardial infarction: Secondary | ICD-10-CM

## 2014-02-19 DIAGNOSIS — R002 Palpitations: Secondary | ICD-10-CM | POA: Diagnosis not present

## 2014-02-19 DIAGNOSIS — F17210 Nicotine dependence, cigarettes, uncomplicated: Secondary | ICD-10-CM | POA: Insufficient documentation

## 2014-02-19 DIAGNOSIS — I2510 Atherosclerotic heart disease of native coronary artery without angina pectoris: Secondary | ICD-10-CM | POA: Diagnosis not present

## 2014-02-19 DIAGNOSIS — Z7901 Long term (current) use of anticoagulants: Secondary | ICD-10-CM | POA: Insufficient documentation

## 2014-02-19 DIAGNOSIS — F1910 Other psychoactive substance abuse, uncomplicated: Secondary | ICD-10-CM

## 2014-02-19 DIAGNOSIS — G459 Transient cerebral ischemic attack, unspecified: Principal | ICD-10-CM | POA: Insufficient documentation

## 2014-02-19 DIAGNOSIS — G441 Vascular headache, not elsewhere classified: Secondary | ICD-10-CM

## 2014-02-19 DIAGNOSIS — R519 Headache, unspecified: Secondary | ICD-10-CM

## 2014-02-19 DIAGNOSIS — Z955 Presence of coronary angioplasty implant and graft: Secondary | ICD-10-CM | POA: Insufficient documentation

## 2014-02-19 DIAGNOSIS — Z720 Tobacco use: Secondary | ICD-10-CM

## 2014-02-19 DIAGNOSIS — I513 Intracardiac thrombosis, not elsewhere classified: Secondary | ICD-10-CM

## 2014-02-19 DIAGNOSIS — I639 Cerebral infarction, unspecified: Secondary | ICD-10-CM | POA: Diagnosis present

## 2014-02-19 LAB — DIFFERENTIAL
Basophils Absolute: 0 10*3/uL (ref 0.0–0.1)
Basophils Relative: 0 % (ref 0–1)
Eosinophils Absolute: 0 10*3/uL (ref 0.0–0.7)
Eosinophils Relative: 0 % (ref 0–5)
LYMPHS PCT: 10 % — AB (ref 12–46)
Lymphs Abs: 0.9 10*3/uL (ref 0.7–4.0)
MONO ABS: 0.5 10*3/uL (ref 0.1–1.0)
MONOS PCT: 5 % (ref 3–12)
NEUTROS ABS: 7.6 10*3/uL (ref 1.7–7.7)
Neutrophils Relative %: 85 % — ABNORMAL HIGH (ref 43–77)

## 2014-02-19 LAB — CBC
HEMATOCRIT: 34.9 % — AB (ref 36.0–46.0)
HEMOGLOBIN: 11.8 g/dL — AB (ref 12.0–15.0)
MCH: 31.8 pg (ref 26.0–34.0)
MCHC: 33.8 g/dL (ref 30.0–36.0)
MCV: 94.1 fL (ref 78.0–100.0)
Platelets: 217 10*3/uL (ref 150–400)
RBC: 3.71 MIL/uL — ABNORMAL LOW (ref 3.87–5.11)
RDW: 17 % — AB (ref 11.5–15.5)
WBC: 9 10*3/uL (ref 4.0–10.5)

## 2014-02-19 LAB — APTT: aPTT: 26 seconds (ref 24–37)

## 2014-02-19 LAB — CBG MONITORING, ED: GLUCOSE-CAPILLARY: 89 mg/dL (ref 70–99)

## 2014-02-19 LAB — COMPREHENSIVE METABOLIC PANEL
ALT: 21 U/L (ref 0–35)
ANION GAP: 17 — AB (ref 5–15)
AST: 56 U/L — ABNORMAL HIGH (ref 0–37)
Albumin: 3.7 g/dL (ref 3.5–5.2)
Alkaline Phosphatase: 114 U/L (ref 39–117)
BILIRUBIN TOTAL: 0.5 mg/dL (ref 0.3–1.2)
BUN: 7 mg/dL (ref 6–23)
CHLORIDE: 100 meq/L (ref 96–112)
CO2: 21 meq/L (ref 19–32)
CREATININE: 0.62 mg/dL (ref 0.50–1.10)
Calcium: 9 mg/dL (ref 8.4–10.5)
Glucose, Bld: 93 mg/dL (ref 70–99)
Potassium: 3.8 mEq/L (ref 3.7–5.3)
Sodium: 138 mEq/L (ref 137–147)
Total Protein: 7.5 g/dL (ref 6.0–8.3)

## 2014-02-19 LAB — RAPID URINE DRUG SCREEN, HOSP PERFORMED
Amphetamines: NOT DETECTED
Barbiturates: NOT DETECTED
Benzodiazepines: NOT DETECTED
Cocaine: NOT DETECTED
Opiates: NOT DETECTED
Tetrahydrocannabinol: NOT DETECTED

## 2014-02-19 LAB — PROTIME-INR
INR: 1 (ref 0.00–1.49)
Prothrombin Time: 13.3 seconds (ref 11.6–15.2)

## 2014-02-19 LAB — I-STAT TROPONIN, ED: TROPONIN I, POC: 0 ng/mL (ref 0.00–0.08)

## 2014-02-19 MED ORDER — LABETALOL HCL 5 MG/ML IV SOLN
10.0000 mg | Freq: Once | INTRAVENOUS | Status: DC
  Filled 2014-02-19: qty 4

## 2014-02-19 MED ORDER — IOHEXOL 350 MG/ML SOLN
50.0000 mL | Freq: Once | INTRAVENOUS | Status: AC | PRN
  Administered 2014-02-19: 50 mL via INTRAVENOUS

## 2014-02-19 MED ORDER — PROCHLORPERAZINE EDISYLATE 5 MG/ML IJ SOLN
10.0000 mg | Freq: Once | INTRAMUSCULAR | Status: AC
  Administered 2014-02-19: 10 mg via INTRAVENOUS
  Filled 2014-02-19: qty 2

## 2014-02-19 NOTE — ED Notes (Signed)
Labetalol held at this time per instruction of neurologist.

## 2014-02-19 NOTE — ED Notes (Signed)
C/o left sided headache and left sided vision change.  Started around 1600 today.  Gait impaired per ems.  Hx of mi w/stent. Cva.  Usually on coumadin but has been off since 02/14/14 r/t DDS appt.  Hx of Anti-thrombin 3 def.

## 2014-02-19 NOTE — ED Provider Notes (Signed)
CSN: [id number]     Arrival date & time 02/19/14  1937 History   First MD Initiated Contact with Patient 02/19/14 2115     Chief Complaint  Patient presents with  . Code Stroke    An emergency department physician performed an initial assessment on this suspected stroke patient at 43. (Consider location/radiation/quality/duration/timing/severity/associated sxs/prior Treatment) The history is provided by the patient and the EMS personnel.   patient brought in by EMS onset at 4:00 in the afternoon left-sided headache left-sided visual changes left-sided numbness. EMS said there was a gait abnormality. Patient with a history of a hypercoagulable state. Antithrombin III deficiency. Patient arrived at about 4 hours since last normal period arrived as a code stroke. Medical bridge by me. Patient able to verbalize. Patient sent to CT. Following that was evaluated by stroke team. Patient normally on Coumadin was stopped several days ago pending a dental procedure.  Past Medical History  Diagnosis Date  . Stroke     assoc with short term memory loss and right peripheral vision loss; age 43   . Blood transfusion   . Anxiety   . Coronary artery disease     Apical LAD infarction '00; NSTEMI s/p BMS to prox LAD '09; Cath 12/2011 single vessel CAD w/ patent LAD stent w/ stable mild ISR, nl LV systolic fxn  . Anemia   . Antithrombin III deficiency     ?pt not sure if true diagnosis  . Tobacco abuse   . High blood pressure   . Myocardial infarction    Past Surgical History  Procedure Laterality Date  . Tubal ligation    . Cardiac catheterization    . Esophagogastroduodenoscopy N/A 06/09/2012    Procedure: ESOPHAGOGASTRODUODENOSCOPY (EGD);  Surgeon: Theda Belfast, MD;  Location: [facility];  Service: Endoscopy;  Laterality: N/A;  . Coronary angioplasty    . Esophagogastroduodenoscopy N/A 08/02/2013    Procedure: ESOPHAGOGASTRODUODENOSCOPY (EGD);  Surgeon: Meryl Dare, MD;  Location: [facility]  ENDOSCOPY;  Service: Endoscopy;  Laterality: N/A;   Family History  Problem Relation Age of Onset  . Heart disease Brother     arrhythmia; died  . Breast cancer Maternal Aunt    History  Substance Use Topics  . Smoking status: Current Every Day Smoker -- 0.20 packs/day for 1 years    Types: Cigarettes  . Smokeless tobacco: Never Used  . Alcohol Use: 0.0 oz/week    0 Cans of beer per week     Comment: occasion   OB History    No data available     Review of Systems  Constitutional: Negative for fever.  HENT: Negative for congestion.   Eyes: Positive for visual disturbance.  Respiratory: Negative for shortness of breath.   Cardiovascular: Negative for chest pain.  Gastrointestinal: Negative for nausea, vomiting and abdominal pain.  Genitourinary: Negative for dysuria.  Musculoskeletal: Negative for back pain and neck pain.  Skin: Negative for rash.  Neurological: Positive for speech difficulty, numbness and headaches.  Hematological: Bruises/bleeds easily.  Psychiatric/Behavioral: Negative for confusion.      Allergies  Review of patient's allergies indicates no known allergies.  Home Medications   Prior to Admission medications   Medication Sig Start Date End Date Taking? Authorizing Provider  aspirin EC 81 MG tablet Take 81 mg by mouth daily.   Yes Historical Provider, MD  nitroGLYCERIN (NITROSTAT) 0.4 MG SL tablet Place 1 tablet (0.4 mg total) under the tongue every 5 (five) minutes as needed for chest pain (  up to 3 doses). 10/14/13  Yes Beatrice Lecher, PA-C  warfarin (COUMADIN) 5 MG tablet Take 1.5 tablets (7.5 mg total) by mouth daily. 10/19/13  Yes Ejiroghene E Emokpae, MD  atorvastatin (LIPITOR) 40 MG tablet Take 1 tablet (40 mg total) by mouth daily. Patient not taking: Reported on 02/19/2014 01/27/14 01/27/15  Lewayne Bunting, MD   BP 132/84 mmHg  Pulse 72  Temp(Src) 98.2 F (36.8 C) (Oral)  Resp 25  Ht 5\' 6"  (1.676 m)  Wt 164 lb (74.39 kg)  BMI 26.48  kg/m2  SpO2 97%  LMP 02/19/2014 (Exact Date) Physical Exam  Constitutional: She is oriented to person, place, and time. She appears well-developed and well-nourished. No distress.  HENT:  Head: Normocephalic and atraumatic.  Mouth/Throat: Oropharynx is clear and moist.  Eyes: Conjunctivae and EOM are normal. Pupils are equal, round, and reactive to light.  Neck: Normal range of motion. Neck supple.  Cardiovascular: Normal rate and regular rhythm.   No murmur heard. Pulmonary/Chest: Effort normal and breath sounds normal. No respiratory distress.  Abdominal: Soft. Bowel sounds are normal. There is no tenderness.  Musculoskeletal: Normal range of motion.  Neurological: She is alert and oriented to person, place, and time. No cranial nerve deficit. She exhibits normal muscle tone. Coordination normal.  Except for numbness to the left side of the body.  Skin: Skin is warm. No rash noted.  Nursing note and vitals reviewed.   ED Course  Procedures (including critical care time) Labs Review Labs Reviewed  CBC - Abnormal; Notable for the following:    RBC 3.71 (*)    Hemoglobin 11.8 (*)    HCT 34.9 (*)    RDW 17.0 (*)    All other components within normal limits  DIFFERENTIAL - Abnormal; Notable for the following:    Neutrophils Relative % 85 (*)    Lymphocytes Relative 10 (*)    All other components within normal limits  COMPREHENSIVE METABOLIC PANEL - Abnormal; Notable for the following:    AST 56 (*)    Anion gap 17 (*)    All other components within normal limits  PROTIME-INR  APTT  URINE RAPID DRUG SCREEN (HOSP PERFORMED)  CBG MONITORING, ED  I-STAT TROPOININ, ED   Results for orders placed or performed during the hospital encounter of 02/19/14  Protime-INR  Result Value Ref Range   Prothrombin Time 13.3 11.6 - 15.2 seconds   INR 1.00 0.00 - 1.49  APTT  Result Value Ref Range   aPTT 26 24 - 37 seconds  CBC  Result Value Ref Range   WBC 9.0 4.0 - 10.5 K/uL   RBC  3.71 (L) 3.87 - 5.11 MIL/uL   Hemoglobin 11.8 (L) 12.0 - 15.0 g/dL   HCT 16.1 (L) 09.6 - 04.5 %   MCV 94.1 78.0 - 100.0 fL   MCH 31.8 26.0 - 34.0 pg   MCHC 33.8 30.0 - 36.0 g/dL   RDW 40.9 (H) 81.1 - 91.4 %   Platelets 217 150 - 400 K/uL  Differential  Result Value Ref Range   Neutrophils Relative % 85 (H) 43 - 77 %   Neutro Abs 7.6 1.7 - 7.7 K/uL   Lymphocytes Relative 10 (L) 12 - 46 %   Lymphs Abs 0.9 0.7 - 4.0 K/uL   Monocytes Relative 5 3 - 12 %   Monocytes Absolute 0.5 0.1 - 1.0 K/uL   Eosinophils Relative 0 0 - 5 %   Eosinophils Absolute 0.0 0.0 -  0.7 K/uL   Basophils Relative 0 0 - 1 %   Basophils Absolute 0.0 0.0 - 0.1 K/uL  Comprehensive metabolic panel  Result Value Ref Range   Sodium 138 137 - 147 mEq/L   Potassium 3.8 3.7 - 5.3 mEq/L   Chloride 100 96 - 112 mEq/L   CO2 21 19 - 32 mEq/L   Glucose, Bld 93 70 - 99 mg/dL   BUN 7 6 - 23 mg/dL   Creatinine, Ser 1.61 0.50 - 1.10 mg/dL   Calcium 9.0 8.4 - 09.6 mg/dL   Total Protein 7.5 6.0 - 8.3 g/dL   Albumin 3.7 3.5 - 5.2 g/dL   AST 56 (H) 0 - 37 U/L   ALT 21 0 - 35 U/L   Alkaline Phosphatase 114 39 - 117 U/L   Total Bilirubin 0.5 0.3 - 1.2 mg/dL   GFR calc non Af Amer >90 >90 mL/min   GFR calc Af Amer >90 >90 mL/min   Anion gap 17 (H) 5 - 15  Urine rapid drug screen (hosp performed)  Result Value Ref Range   Opiates NONE DETECTED NONE DETECTED   Cocaine NONE DETECTED NONE DETECTED   Benzodiazepines NONE DETECTED NONE DETECTED   Amphetamines NONE DETECTED NONE DETECTED   Tetrahydrocannabinol NONE DETECTED NONE DETECTED   Barbiturates NONE DETECTED NONE DETECTED  CBG monitoring, ED  Result Value Ref Range   Glucose-Capillary 89 70 - 99 mg/dL   Comment 1 Notify RN    Comment 2 Documented in Chart   I-stat troponin, ED (not at [facility])  Result Value Ref Range   Troponin i, poc 0.00 0.00 - 0.08 ng/mL   Comment 3             Imaging Review Ct Angio Head W/cm &/or Wo Cm  02/19/2014   CLINICAL DATA:  Initial  valuation for acute left-sided headache, numbness, vision change. Gait impairment.  EXAM: CT ANGIOGRAPHY HEAD AND NECK  TECHNIQUE: Multidetector CT imaging of the head and neck was performed using the standard protocol during bolus administration of intravenous contrast. Multiplanar CT image reconstructions and MIPs were obtained to evaluate the vascular anatomy. Carotid stenosis measurements (when applicable) are obtained utilizing NASCET criteria, using the distal internal carotid diameter as the denominator.  CONTRAST:  50mL OMNIPAQUE IOHEXOL 350 MG/ML SOLN  COMPARISON:  Prior noncontrast head CT performed on the same day.  FINDINGS: CTA HEAD FINDINGS  ANTERIOR CIRCULATION:  The petrous, cavernous, and supra clinoid segments of the internal carotid arteries are widely patent bilaterally. A1 segments symmetric in well opacified. Anterior communicating artery within normal limits. Anterior cerebral arteries well opacified.  M1 segments widely patent bilaterally without significant stenosis or proximal branch occlusion. Distal MCA branches well opacified.  POSTERIOR CIRCULATION:  Vertebral arteries are codominant. Posterior inferior cerebellar arteries opacified bilaterally. Vertebrobasilar junction and basilar artery are widely patent. Superior cerebellar arteries are patent. The right P1 and P2 segments are well opacified without significant stenosis or proximal branch occlusion. Left P1 segment well opacified. Flow within the distal left posterior cerebral artery mildly attenuated, likely related to remote left occipital infarct.  No aneurysm or vascular malformation. No abnormal enhancement on delayed sequence.  CTA NECK FINDINGS  The visualized aortic arch is of normal caliber with normal 3 vessel morphology. No high-grade stenosis seen at the origin of the great vessels. Subclavian arteries widely patent.  Common carotid artery is well opacified and are symmetric in caliber. Carotid bifurcations within normal  limits. Internal carotid arteries  are well opacified to the skullbase without hemodynamically significant stenosis, dissection, or occlusion.  External carotid arteries and their branches are within normal limits.  Both vertebral arteries arise from the subclavian arteries. Vertebral arteries are well opacified along their entire course without evidence of dissection or significant stenosis.  No soft tissue abnormality identified within the neck. No adenopathy. Thyroid gland is normal. Visualized superior mediastinum within normal limits. Visualized lungs are clear.  No acute osseous abnormality. Reversal of the normal cervical lordosis likely related to patient positioning. No worrisome lytic or blastic osseous lesions.  IMPRESSION: 1. Unremarkable CTA of the neck with no evidence for hemodynamically significant stenosis, dissection, or other abnormality. 2. No proximal branch occlusion or hemodynamically significant stenosis within the intracranial circulation. There is mildly attenuated flow within the distal left PCA, likely related to remote left occipital infarct.   Electronically Signed   By: Rise Mu M.D.   On: 02/19/2014 22:00   Ct Head (brain) Wo Contrast  02/19/2014   CLINICAL DATA:  Code stroke.  Left-sided numbness.  EXAM: CT HEAD WITHOUT CONTRAST  TECHNIQUE: Contiguous axial images were obtained from the base of the skull through the vertex without intravenous contrast.  COMPARISON:  10/30/2013  FINDINGS: Ventricles and sulci are appropriate for patient's age. Old left occipital lobe infarct, stable. No evidence for acute cortically based infarct, intracranial hemorrhage, mass lesion or mass effect. The mastoid air cells are well aerated. Paranasal sinuses are unremarkable. Calvarium is intact.  IMPRESSION: Remote left occipital infarct.  No acute intracranial process.  Critical Value/emergent results were called by telephone at the time of interpretation on 02/19/2014 at 8:01 pm to Dr.  Thad Ranger, who verbally acknowledged these results.   Electronically Signed   By: Annia Belt M.D.   On: 02/19/2014 20:01   Ct Angio Neck W/cm &/or Wo/cm  02/19/2014   CLINICAL DATA:  Initial valuation for acute left-sided headache, numbness, vision change. Gait impairment.  EXAM: CT ANGIOGRAPHY HEAD AND NECK  TECHNIQUE: Multidetector CT imaging of the head and neck was performed using the standard protocol during bolus administration of intravenous contrast. Multiplanar CT image reconstructions and MIPs were obtained to evaluate the vascular anatomy. Carotid stenosis measurements (when applicable) are obtained utilizing NASCET criteria, using the distal internal carotid diameter as the denominator.  CONTRAST:  31mL OMNIPAQUE IOHEXOL 350 MG/ML SOLN  COMPARISON:  Prior noncontrast head CT performed on the same day.  FINDINGS: CTA HEAD FINDINGS  ANTERIOR CIRCULATION:  The petrous, cavernous, and supra clinoid segments of the internal carotid arteries are widely patent bilaterally. A1 segments symmetric in well opacified. Anterior communicating artery within normal limits. Anterior cerebral arteries well opacified.  M1 segments widely patent bilaterally without significant stenosis or proximal branch occlusion. Distal MCA branches well opacified.  POSTERIOR CIRCULATION:  Vertebral arteries are codominant. Posterior inferior cerebellar arteries opacified bilaterally. Vertebrobasilar junction and basilar artery are widely patent. Superior cerebellar arteries are patent. The right P1 and P2 segments are well opacified without significant stenosis or proximal branch occlusion. Left P1 segment well opacified. Flow within the distal left posterior cerebral artery mildly attenuated, likely related to remote left occipital infarct.  No aneurysm or vascular malformation. No abnormal enhancement on delayed sequence.  CTA NECK FINDINGS  The visualized aortic arch is of normal caliber with normal 3 vessel morphology. No  high-grade stenosis seen at the origin of the great vessels. Subclavian arteries widely patent.  Common carotid artery is well opacified and are symmetric in caliber. Carotid  bifurcations within normal limits. Internal carotid arteries are well opacified to the skullbase without hemodynamically significant stenosis, dissection, or occlusion.  External carotid arteries and their branches are within normal limits.  Both vertebral arteries arise from the subclavian arteries. Vertebral arteries are well opacified along their entire course without evidence of dissection or significant stenosis.  No soft tissue abnormality identified within the neck. No adenopathy. Thyroid gland is normal. Visualized superior mediastinum within normal limits. Visualized lungs are clear.  No acute osseous abnormality. Reversal of the normal cervical lordosis likely related to patient positioning. No worrisome lytic or blastic osseous lesions.  IMPRESSION: 1. Unremarkable CTA of the neck with no evidence for hemodynamically significant stenosis, dissection, or other abnormality. 2. No proximal branch occlusion or hemodynamically significant stenosis within the intracranial circulation. There is mildly attenuated flow within the distal left PCA, likely related to remote left occipital infarct.   Electronically Signed   By: Rise MuBenjamin  McClintock M.D.   On: 02/19/2014 22:00     EKG Interpretation None      CRITICAL CARE Performed by: Vanetta MuldersZACKOWSKI,Kaleeya Hancock Total critical care time: 30 Critical care time was exclusive of separately billable procedures and treating other patients. Critical care was necessary to treat or prevent imminent or life-threatening deterioration. Critical care was time spent personally by me on the following activities: development of treatment plan with patient and/or surrogate as well as nursing, discussions with consultants, evaluation of patient's response to treatment, examination of patient, obtaining history  from patient or surrogate, ordering and performing treatments and interventions, ordering and review of laboratory studies, ordering and review of radiographic studies, pulse oximetry and re-evaluation of patient's condition.    MDM   Final diagnoses:  Stroke    Patient brought in by EMS as a code stroke. Onset of symptoms at 4:00 in the afternoon. Patient arrived every 4 hours into the symptoms. EMS noted that the gait was impaired. Patient's main complaints were left-sided headache left-sided vision change and numbness to the left side. Patient usually on Coumadin but has been off for since December 1 due to a dental appointment. Patient has a history of antithrombin III deficiency. Patient's stroke scale was only a 1. Head CT was negative for any acute events. Seen by stroke team. Deemed not the candidate for thrombolytic. Due to her hypercoagulable state they did order CT head angios CT neck injury without any acute findings. They wanted her admitted. Patient's followed by outpatient clinics. Patient is remains stable since here. Patient will be admitted to telemetry. Patient was seen by me immediately upon arrival. The rapid neuro exam was completed and patient was taken to CT scan. They're planning an MRI in the morning.    Vanetta MuldersScott Kassey Laforest, MD 02/19/14 [id number]

## 2014-02-19 NOTE — ED Notes (Signed)
Ambulated to bathroom.  Good steady gait noted.  Patient states I walked better now then when I was picked up.

## 2014-02-19 NOTE — ED Notes (Signed)
Neurologist Thad Ranger, MD at bedside in CT.

## 2014-02-19 NOTE — H&P (Signed)
Date: 02/19/2014               Patient Name:  Lindsey [name] MRN: [id number]  DOB: [birth year] Age / Sex: 43 y.o., female   PCP: Onnie Boer, MD         Medical Service: Internal Medicine Teaching Service         Attending Physician: Dr. Burns Spain, MD    First Contact: Dr. Hyacinth Meeker Pager: [phone]  Second Contact: Dr. Boykin Peek  Pager: [phone]       After Hours (After 5p/  First Contact Pager: [phone]  weekends / holidays): Second Contact Pager: [phone]   Chief Complaint: Stroke   History of Present Illness: Ms. Pflug is a 43yo woman w/ PMHx of prior CVA in 2010, CAD (apical LAD infarction '00; NSTEMI s/p BMS to prox LAD '09), HTN, and tobacco abuse who presented to the ED as a code stroke. Patient states she was at home when around 4 PM she suddenly had left-sided pressure and squeezing in her head. She states she then felt numbness in her left arm. She noted some blurred vision as well. She states around 7 PM she had a headache all over and that her mother told her she had slurred speech so she decided to call EMS. Per EMS notes, patient had gait abnormality. Patient denies any weakness. She states her numbness and blurred vision have improved, but that her headache is still bothering her. She notes she stopped taking her coumadin 6 days ago because she was scheduled to have a tooth extraction tomorrow. She had cleared this with her cardiologist. She denies any history of headaches or migraines. She notes she has not been taking her Lipitor.   In the ED, an emergent CT Head was performed which showed evidence of a remote left occipital infarct, but no acute intracranial abnormalities. CTA Head/Neck was also done which was negative for acute abnormalities.   Meds: Current Facility-Administered Medications  Medication Dose Route Frequency Provider Last Rate Last Dose  . labetalol (NORMODYNE,TRANDATE) injection 10 mg  10 mg Intravenous Once Thana Farr, MD    Stopped at 02/19/14 2026   Current Outpatient Prescriptions  Medication Sig Dispense Refill  . aspirin EC 81 MG tablet Take 81 mg by mouth daily.    . nitroGLYCERIN (NITROSTAT) 0.4 MG SL tablet Place 1 tablet (0.4 mg total) under the tongue every 5 (five) minutes as needed for chest pain (up to 3 doses). 25 tablet 3  . warfarin (COUMADIN) 5 MG tablet Take 1.5 tablets (7.5 mg total) by mouth daily. 42 tablet 0  . atorvastatin (LIPITOR) 40 MG tablet Take 1 tablet (40 mg total) by mouth daily. (Patient not taking: Reported on 02/19/2014) 30 tablet 11    Allergies: Allergies as of 02/19/2014  . (No Known Allergies)   Past Medical History  Diagnosis Date  . Stroke     assoc with short term memory loss and right peripheral vision loss; age 83   . Blood transfusion   . Anxiety   . Coronary artery disease     Apical LAD infarction '00; NSTEMI s/p BMS to prox LAD '09; Cath 12/2011 single vessel CAD w/ patent LAD stent w/ stable mild ISR, nl LV systolic fxn  . Anemia   . Antithrombin III deficiency     ?pt not sure if true diagnosis  . Tobacco abuse   . High blood pressure   . Myocardial infarction    Past Surgical  History  Procedure Laterality Date  . Tubal ligation    . Cardiac catheterization    . Esophagogastroduodenoscopy N/A 06/09/2012    Procedure: ESOPHAGOGASTRODUODENOSCOPY (EGD);  Surgeon: Theda BelfastPatrick D Hung, MD;  Location[facility];  Service: Endoscopy;  Laterality: N/A;  . Coronary angioplasty    . Esophagogastroduodenoscopy N/A 08/02/2013    Procedure: ESOPHAGOGASTRODUODENOSCOPY (EGD);  Surgeon: Meryl DareMalcolm T Stark, MD;  [facility];  Service: Endoscopy;  Laterality: N/A;   Family History  Problem Relation Age of Onset  . Heart disease Brother     arrhythmia; died  . Breast cancer Maternal Aunt    History   Social History  . Marital Status: Divorced    Spouse Name: N/A    Number of Children: 2  . Years of Education: N/A   Occupational History  . Not on  file.   Social History Main Topics  . Smoking status: Current Every Day Smoker -- 0.20 packs/day for 1 years    Types: Cigarettes  . Smokeless tobacco: Never Used  . Alcohol Use: 0.0 oz/week    0 Cans of beer per week     Comment: occasion  . Drug Use: Yes    Special: Cocaine     Comment: hx of use x 1  . Sexual Activity: No   Other Topics Concern  . Not on file   Social History Narrative    Review of Systems: General: Denies fever, chills, night sweats, changes in weight, changes in appetite HEENT: Denies ear pain, rhinorrhea, sore throat CV: Denies CP, palpitations, SOB, orthopnea Pulm: Denies SOB, cough, wheezing GI: Denies abdominal pain, nausea, vomiting, diarrhea, constipation, melena, hematochezia GU: Denies dysuria, hematuria, frequency Msk: Denies muscle cramps, joint pains Neuro: See HPI Skin: Denies rashes, bruising  Physical Exam: Blood pressure 132/84, pulse 72, temperature 98.2 F (36.8 C), temperature source Oral, resp. rate 25, height 5\' 6"  (1.676 m), weight 74.39 kg (164 lb), last menstrual period 02/19/2014, SpO2 97 %. General: alert, sitting up in chair, NAD HEENT: [place]/AT, EOMI, no ptosis, PERRL, mucus membranes moist CV: RRR, normal S1/S2, no m/g/r Pulm: CTA bilaterally, breaths non-labored Abd: BS+, soft, non-tender Ext: warm, no edema, moves all Neuro: alert and oriented x 3, speech fluent without aphasia, smile symmetric, sensation symmetric in upper and lower extremities, strength symmetric in upper and lower extremities bilaterally, normal finger to nose  Lab results: Basic Metabolic Panel:  Recent Labs  16/12/9610/06/15 1944  NA 138  K 3.8  CL 100  CO2 21  GLUCOSE 93  BUN 7  CREATININE 0.62  CALCIUM 9.0   Liver Function Tests:  Recent Labs  02/19/14 1944  AST 56*  ALT 21  ALKPHOS 114  BILITOT 0.5  PROT 7.5  ALBUMIN 3.7   No results for input(s): LIPASE, AMYLASE in the last 72 hours. No results for input(s): AMMONIA in the last 72  hours. CBC:  Recent Labs  02/19/14 1944  WBC 9.0  NEUTROABS 7.6  HGB 11.8*  HCT 34.9*  MCV 94.1  PLT 217   Cardiac Enzymes: No results for input(s): CKTOTAL, CKMB, CKMBINDEX, TROPONINI in the last 72 hours. BNP: No results for input(s): PROBNP in the last 72 hours. D-Dimer: No results for input(s): DDIMER in the last 72 hours. CBG:  Recent Labs  02/19/14 2003  GLUCAP 89   Hemoglobin A1C: No results for input(s): HGBA1C in the last 72 hours. Fasting Lipid Panel: No results for input(s): CHOL, HDL, LDLCALC, TRIG, CHOLHDL, LDLDIRECT in the last 72  hours. Thyroid Function Tests: No results for input(s): TSH, T4TOTAL, FREET4, T3FREE, THYROIDAB in the last 72 hours. Anemia Panel: No results for input(s): VITAMINB12, FOLATE, FERRITIN, TIBC, IRON, RETICCTPCT in the last 72 hours. Coagulation:  Recent Labs  02/19/14 1944  LABPROT 13.3  INR 1.00   Urine Drug Screen: Drugs of Abuse     Component Value Date/Time   LABOPIA NONE DETECTED 02/19/2014 2200   COCAINSCRNUR NONE DETECTED 02/19/2014 2200   LABBENZ NONE DETECTED 02/19/2014 2200   AMPHETMU NONE DETECTED 02/19/2014 2200   THCU NONE DETECTED 02/19/2014 2200   LABBARB NONE DETECTED 02/19/2014 2200    Alcohol Level: No results for input(s): ETH in the last 72 hours. Urinalysis: No results for input(s): COLORURINE, LABSPEC, PHURINE, GLUCOSEU, HGBUR, BILIRUBINUR, KETONESUR, PROTEINUR, UROBILINOGEN, NITRITE, LEUKOCYTESUR in the last 72 hours.  Invalid input(s): APPERANCEUR   Imaging results:  Ct Angio Head W/cm &/or Wo Cm  02/19/2014   CLINICAL DATA:  Initial valuation for acute left-sided headache, numbness, vision change. Gait impairment.  EXAM: CT ANGIOGRAPHY HEAD AND NECK  TECHNIQUE: Multidetector CT imaging of the head and neck was performed using the standard protocol during bolus administration of intravenous contrast. Multiplanar CT image reconstructions and MIPs were obtained to evaluate the vascular  anatomy. Carotid stenosis measurements (when applicable) are obtained utilizing NASCET criteria, using the distal internal carotid diameter as the denominator.  CONTRAST:  55mL OMNIPAQUE IOHEXOL 350 MG/ML SOLN  COMPARISON:  Prior noncontrast head CT performed on the same day.  FINDINGS: CTA HEAD FINDINGS  ANTERIOR CIRCULATION:  The petrous, cavernous, and supra clinoid segments of the internal carotid arteries are widely patent bilaterally. A1 segments symmetric in well opacified. Anterior communicating artery within normal limits. Anterior cerebral arteries well opacified.  M1 segments widely patent bilaterally without significant stenosis or proximal branch occlusion. Distal MCA branches well opacified.  POSTERIOR CIRCULATION:  Vertebral arteries are codominant. Posterior inferior cerebellar arteries opacified bilaterally. Vertebrobasilar junction and basilar artery are widely patent. Superior cerebellar arteries are patent. The right P1 and P2 segments are well opacified without significant stenosis or proximal branch occlusion. Left P1 segment well opacified. Flow within the distal left posterior cerebral artery mildly attenuated, likely related to remote left occipital infarct.  No aneurysm or vascular malformation. No abnormal enhancement on delayed sequence.  CTA NECK FINDINGS  The visualized aortic arch is of normal caliber with normal 3 vessel morphology. No high-grade stenosis seen at the origin of the great vessels. Subclavian arteries widely patent.  Common carotid artery is well opacified and are symmetric in caliber. Carotid bifurcations within normal limits. Internal carotid arteries are well opacified to the skullbase without hemodynamically significant stenosis, dissection, or occlusion.  External carotid arteries and their branches are within normal limits.  Both vertebral arteries arise from the subclavian arteries. Vertebral arteries are well opacified along their entire course without evidence  of dissection or significant stenosis.  No soft tissue abnormality identified within the neck. No adenopathy. Thyroid gland is normal. Visualized superior mediastinum within normal limits. Visualized lungs are clear.  No acute osseous abnormality. Reversal of the normal cervical lordosis likely related to patient positioning. No worrisome lytic or blastic osseous lesions.  IMPRESSION: 1. Unremarkable CTA of the neck with no evidence for hemodynamically significant stenosis, dissection, or other abnormality. 2. No proximal branch occlusion or hemodynamically significant stenosis within the intracranial circulation. There is mildly attenuated flow within the distal left PCA, likely related to remote left occipital infarct.   Electronically Signed  By: Rise Mu M.D.   On: 02/19/2014 22:00   Ct Head (brain) Wo Contrast  02/19/2014   CLINICAL DATA:  Code stroke.  Left-sided numbness.  EXAM: CT HEAD WITHOUT CONTRAST  TECHNIQUE: Contiguous axial images were obtained from the base of the skull through the vertex without intravenous contrast.  COMPARISON:  10/30/2013  FINDINGS: Ventricles and sulci are appropriate for patient's age. Old left occipital lobe infarct, stable. No evidence for acute cortically based infarct, intracranial hemorrhage, mass lesion or mass effect. The mastoid air cells are well aerated. Paranasal sinuses are unremarkable. Calvarium is intact.  IMPRESSION: Remote left occipital infarct.  No acute intracranial process.  Critical Value/emergent results were called by telephone at the time of interpretation on 02/19/2014 at 8:01 pm to Dr. Thad Ranger, who verbally acknowledged these results.   Electronically Signed   By: Annia Belt M.D.   On: 02/19/2014 20:01   Ct Angio Neck W/cm &/or Wo/cm  02/19/2014   CLINICAL DATA:  Initial valuation for acute left-sided headache, numbness, vision change. Gait impairment.  EXAM: CT ANGIOGRAPHY HEAD AND NECK  TECHNIQUE: Multidetector CT imaging of the  head and neck was performed using the standard protocol during bolus administration of intravenous contrast. Multiplanar CT image reconstructions and MIPs were obtained to evaluate the vascular anatomy. Carotid stenosis measurements (when applicable) are obtained utilizing NASCET criteria, using the distal internal carotid diameter as the denominator.  CONTRAST:  50mL OMNIPAQUE IOHEXOL 350 MG/ML SOLN  COMPARISON:  Prior noncontrast head CT performed on the same day.  FINDINGS: CTA HEAD FINDINGS  ANTERIOR CIRCULATION:  The petrous, cavernous, and supra clinoid segments of the internal carotid arteries are widely patent bilaterally. A1 segments symmetric in well opacified. Anterior communicating artery within normal limits. Anterior cerebral arteries well opacified.  M1 segments widely patent bilaterally without significant stenosis or proximal branch occlusion. Distal MCA branches well opacified.  POSTERIOR CIRCULATION:  Vertebral arteries are codominant. Posterior inferior cerebellar arteries opacified bilaterally. Vertebrobasilar junction and basilar artery are widely patent. Superior cerebellar arteries are patent. The right P1 and P2 segments are well opacified without significant stenosis or proximal branch occlusion. Left P1 segment well opacified. Flow within the distal left posterior cerebral artery mildly attenuated, likely related to remote left occipital infarct.  No aneurysm or vascular malformation. No abnormal enhancement on delayed sequence.  CTA NECK FINDINGS  The visualized aortic arch is of normal caliber with normal 3 vessel morphology. No high-grade stenosis seen at the origin of the great vessels. Subclavian arteries widely patent.  Common carotid artery is well opacified and are symmetric in caliber. Carotid bifurcations within normal limits. Internal carotid arteries are well opacified to the skullbase without hemodynamically significant stenosis, dissection, or occlusion.  External carotid  arteries and their branches are within normal limits.  Both vertebral arteries arise from the subclavian arteries. Vertebral arteries are well opacified along their entire course without evidence of dissection or significant stenosis.  No soft tissue abnormality identified within the neck. No adenopathy. Thyroid gland is normal. Visualized superior mediastinum within normal limits. Visualized lungs are clear.  No acute osseous abnormality. Reversal of the normal cervical lordosis likely related to patient positioning. No worrisome lytic or blastic osseous lesions.  IMPRESSION: 1. Unremarkable CTA of the neck with no evidence for hemodynamically significant stenosis, dissection, or other abnormality. 2. No proximal branch occlusion or hemodynamically significant stenosis within the intracranial circulation. There is mildly attenuated flow within the distal left PCA, likely related to remote left occipital  infarct.   Electronically Signed   By: Rise Mu M.D.   On: 02/19/2014 22:00    Other results: EKG: not available  Assessment & Plan by Problem: Principal Problem:   TIA (transient ischemic attack) Active Problems:   Coronary Artery Disease   Antithrombin III deficiency   Long term current use of anticoagulant therapy   Headache   Stroke [name] is a 44yo woman w/ PMHx of prior CVA in 2010, CAD (apical LAD infarction '00; NSTEMI s/p BMS to prox LAD '09), HTN, and tobacco abuse who presented with left-sided headache, LUE numbness, blurred vision, and reported slurred speech. CT Head and CTA Head/Neck ruled out acute stroke. Patient's symptoms most likely 2/2 TIA.   TIA: Patient presented with left-sided headache, LUE numbness, blurred vision, and reported slurred speech. CT Head shows old occipital infarct, but negative for acute infarct. CTA Head/Neck negative for acute changes.  Patient was off of coumadin for ~6 days in anticipation of a tooth extraction procedure. This was cleared  with her cardiologist. However, patient seems to have a hypercoaguable state given her hx of stroke at young age and cardiac history. Patient also found to have apical mural thrombus in May this year. Her symptoms are most likely secondary to TIA. Could also consider migraine with aura. However, patient denies history of headaches/migraines. Will do full work up for TIA/stroke. - MRI Brain in AM - Carotid dopplers - HbA1c - Lipid panel - Restart coumadin  - Aspirin 81 mg daily - Atorvastatin 40 mg daily - Neuro checks Q2H - Cardiac monitoring  CAD: Hx of apical LAD infarction '00; NSTEMI s/p BMS to prox LAD '09. Patient takes aspirin 81 mg daily at home. Patient is prescribed Lipitor 40 mg daily, but admitted that she has not taken this medication. Per cardiology visit on 01/27/14, Lipitor was added at that time. Patient did not seem to understand she was supposed to be on this medication.  - Continue aspirin 81 mg daily - Restart Atorvastatin 40 mg daily  ?Antithrombin III Deficiency: Questionable history noted in prior notes. Per record review, patient's antithrombin III levels have been within normal range or even elevated since 2009. Also, patient was seen in 2009 by Dr. Myna Hidalgo (heme/onc) who did not believe patient to have ATIII deficiency. Only hypercoaguable lab that has been abnormal in past was protein C activity which was low at 55% in 2009 and low-normal at 77 in 2011. All other previous hypercoaguable work up seems to be negative. Patient will likely need to be on life long coumadin for her apical mural thrombus regardless.  - Restart coumadin   Hx of Apical Mural Thrombus: Found on echo on 08/02/13. Patient takes coumadin at home. Followed by Dr. Jens Som with cardiology.  - Restart coumadin   Tobacco/Substance Abuse: Patient currently smokes ~ 1/4 pack per day. Also has hx of cocaine abuse as evidenced by positive UDS about 3 months ago. Denies current cocaine use. UDS negative  this admission. Patient needs further education that smoking is a risk factor for stroke and CAD, both of which she already has prior history of and at increased risk for recurrent events.    Diet: Heart healthy DVT/PE PPx: Restart coumadin Dispo: Disposition is deferred at this time, awaiting improvement of current medical problems. Anticipated discharge in approximately 1-2 day(s).   The patient does have a current PCP (Ejiroghene Wendall Stade, MD) and does need an [facility] hospital follow-up appointment after discharge.  The patient does not  have transportation limitations that hinder transportation to clinic appointments.  Signed: Rich Number, MD 02/19/2014, 11:17 PM

## 2014-02-19 NOTE — Consult Note (Addendum)
Referring Physician: Deretha Emory    Chief Complaint: Headache, blurred vision and left sided numbness  HPI: Lindsey [name] is an 43 y.o. female who was at home and had acute onset left sided headache.  Patient then noted blurred vision and left sided numbness.  With no resolution of symptoms EMS was called.  Patient had impairment of gait on their arrival.  Patient was brought in as a code stroke.  BP was elevated.  BS was normal.  Patient has no history of hypertension. On presentation NIHSS was 1.  Headache worsened to involve the whole head.  Described as a squeezing sensation and rated at a 5/10.   Date last known well: Date: 02/19/2014 Time last known well: Time: 16:00 tPA Given: No: Nondisabling symptomatology  Past Medical History  Diagnosis Date  . Stroke     assoc with short term memory loss and right peripheral vision loss; age 21   . Blood transfusion   . Anxiety   . Coronary artery disease     Apical LAD infarction '00; NSTEMI s/p BMS to prox LAD '09; Cath 12/2011 single vessel CAD w/ patent LAD stent w/ stable mild ISR, nl LV systolic fxn  . Anemia   . Antithrombin III deficiency     ?pt not sure if true diagnosis  . Tobacco abuse   . High blood pressure   . Myocardial infarction     Past Surgical History  Procedure Laterality Date  . Tubal ligation    . Cardiac catheterization    . Esophagogastroduodenoscopy N/A 06/09/2012    Procedure: ESOPHAGOGASTRODUODENOSCOPY (EGD);  Surgeon: Theda Belfast, MD;  Location: [facility];  Service: Endoscopy;  Laterality: N/A;  . Coronary angioplasty    . Esophagogastroduodenoscopy N/A 08/02/2013    Procedure: ESOPHAGOGASTRODUODENOSCOPY (EGD);  Surgeon: Meryl Dare, MD;  Location: [facility] ENDOSCOPY;  Service: Endoscopy;  Laterality: N/A;    Family History  Problem Relation Age of Onset  . Heart disease Brother     arrhythmia; died  . Breast cancer Maternal Aunt    Social History:  reports that she has been smoking Cigarettes.   She has a .2 pack-year smoking history. She has never used smokeless tobacco. She reports that she drinks alcohol. She reports that she uses illicit drugs (Cocaine). Last drug screen in August positive for cocaine.    Allergies: No Known Allergies  Medications: I have reviewed the patient's current medications. Prior to Admission:  Current outpatient prescriptions:  aspirin EC 81 MG tablet, Take 81 mg by mouth daily., Disp: , Rfl: ;   atorvastatin (LIPITOR) 40 MG tablet, Take 1 tablet (40 mg total) by mouth daily., Disp: 30 tablet, Rfl: 11;   nitroGLYCERIN (NITROSTAT) 0.4 MG SL tablet, Place 1 tablet (0.4 mg total) under the tongue every 5 (five) minutes as needed for chest pain (up to 3 doses)., Disp: 25 tablet, Rfl: 3 sertraline (ZOLOFT) 50 MG tablet, Take 50 mg by mouth daily., Disp: , Rfl: ;   warfarin (COUMADIN) 5 MG tablet, Take 1.5 tablets (7.5 mg total) by mouth daily., Disp: 42 tablet, Rfl: 0-not taking at this time due to anticipated dental procedure  ROS: History obtained from the patient  General ROS: negative for - chills, fatigue, fever, night sweats, weight gain or weight loss Psychological ROS: negative for - behavioral disorder, hallucinations, memory difficulties, mood swings or suicidal ideation Ophthalmic ROS: as noted in HPI ENT ROS: negative for - epistaxis, nasal discharge, oral lesions, sore throat, tinnitus or vertigo Allergy  and Immunology ROS: negative for - hives or itchy/watery eyes Hematological and Lymphatic ROS: negative for - bleeding problems, bruising or swollen lymph nodes Endocrine ROS: negative for - galactorrhea, hair pattern changes, polydipsia/polyuria or temperature intolerance Respiratory ROS: negative for - cough, hemoptysis, shortness of breath or wheezing Cardiovascular ROS: negative for - chest pain, dyspnea on exertion, edema or irregular heartbeat Gastrointestinal ROS: negative for - abdominal pain, diarrhea, hematemesis, nausea/vomiting or  stool incontinence Genito-Urinary ROS: negative for - dysuria, hematuria, incontinence or urinary frequency/urgency Musculoskeletal ROS: negative for - joint swelling or muscular weakness Neurological ROS: as noted in HPI Dermatological ROS: negative for rash and skin lesion changes  Physical Examination: Blood pressure 134/81, pulse 95, temperature 99.2 F (37.3 C), temperature source Oral, resp. rate 29, height 5\' 6"  (1.676 m), weight 74.39 kg (164 lb), last menstrual period 02/19/2014, SpO2 100 %.  HEENT-  Normocephalic, no lesions, without obvious abnormality.  Normal external eye and conjunctiva.  Normal TM's bilaterally.  Normal auditory canals and external ears. Normal external nose, mucus membranes and septum.  Normal pharynx. Cardiovascular- S1, S2 normal, pulses palpable throughout   Lungs- chest clear, no wheezing, rales, normal symmetric air entry Abdomen- soft, non-tender; bowel sounds normal; no masses,  no organomegaly Extremities- no edema Lymph-no adenopathy palpable Musculoskeletal-no joint tenderness, deformity or swelling Skin-warm and dry, no hyperpigmentation, vitiligo, or suspicious lesions   Neurologic Examination: Mental Status: Alert, oriented, thought content appropriate.  Speech fluent without evidence of aphasia.  Able to follow 3 step commands without difficulty. Cranial Nerves: II: Discs flat bilaterally; Visual fields grossly normal, pupils equal, round, reactive to light and accommodation. Blurry vision binocular III,IV, VI: ptosis not present, extra-ocular motions intact bilaterally V,VII: smile symmetric, facial light touch sensation decreased on the left  VIII: hearing normal bilaterally IX,X: gag reflex present XI: bilateral shoulder shrug XII: midline tongue extension Motor: Right : Upper extremity   5/5    Left:     Upper extremity   5/5  Lower extremity   5/5     Lower extremity   5/5 Tone and bulk:normal tone throughout; no atrophy  noted Sensory: Pinprick and light touch decreased in the LLE Deep Tendon Reflexes: 2+ and symmetric in the upper extremities and absent in the lower extremities Plantars: Right: mute   Left: mute Cerebellar: normal finger-to-nose and normal heel-to-shin testing bilaterally   Laboratory Studies:  Basic Metabolic Panel:  Recent Labs Lab 02/19/14 1944  NA 138  K 3.8  CL 100  CO2 21  GLUCOSE 93  BUN 7  CREATININE 0.62  CALCIUM 9.0    Liver Function Tests:  Recent Labs Lab 02/19/14 1944  AST 56*  ALT 21  ALKPHOS 114  BILITOT 0.5  PROT 7.5  ALBUMIN 3.7   No results for input(s): LIPASE, AMYLASE in the last 168 hours. No results for input(s): AMMONIA in the last 168 hours.  CBC:  Recent Labs Lab 02/19/14 1944  WBC 9.0  NEUTROABS 7.6  HGB 11.8*  HCT 34.9*  MCV 94.1  PLT 217    Cardiac Enzymes: No results for input(s): CKTOTAL, CKMB, CKMBINDEX, TROPONINI in the last 168 hours.  BNP: Invalid input(s): POCBNP  CBG:  Recent Labs Lab 02/19/14 2003  GLUCAP 89    Microbiology: Results for orders placed or performed during the hospital encounter of 09/25/10  Wet prep, genital     Status: Abnormal   Collection Time: 09/25/10  9:24 PM  Result Value Ref Range Status   Yeast  Wet Prep HPF POC NONE SEEN NONE SEEN Final   Trich, Wet Prep NONE SEEN NONE SEEN Final   Clue Cells Wet Prep HPF POC MANY (A) NONE SEEN Final   WBC, Wet Prep HPF POC FEW (A) NONE SEEN Final  Urine culture     Status: None   Collection Time: 09/25/10 10:20 PM  Result Value Ref Range Status   Specimen Description URINE, CLEAN CATCH  Final   Special Requests NONE  Final   Culture  Setup Time [id number]  Final   Colony Count NO GROWTH  Final   Culture NO GROWTH  Final   Report Status 09/26/2010 FINAL  Final    Coagulation Studies:  Recent Labs  02/19/14 1944  LABPROT 13.3  INR 1.00    Urinalysis: No results for input(s): COLORURINE, LABSPEC, PHURINE, GLUCOSEU, HGBUR,  BILIRUBINUR, KETONESUR, PROTEINUR, UROBILINOGEN, NITRITE, LEUKOCYTESUR in the last 168 hours.  Invalid input(s): APPERANCEUR  Lipid Panel:    Component Value Date/Time   CHOL 187 08/01/2013 1605   TRIG 131 08/01/2013 1605   HDL 76 08/01/2013 1605   CHOLHDL 2.5 08/01/2013 1605   VLDL 26 08/01/2013 1605   LDLCALC 85 08/01/2013 1605    HgbA1C:  Lab Results  Component Value Date   HGBA1C 5.2 08/02/2013    Urine Drug Screen:      Component Value Date/Time   LABOPIA NONE DETECTED 10/30/2013 1410   COCAINSCRNUR POSITIVE* 10/30/2013 1410   LABBENZ NONE DETECTED 10/30/2013 1410   AMPHETMU NONE DETECTED 10/30/2013 1410   THCU NONE DETECTED 10/30/2013 1410   LABBARB NONE DETECTED 10/30/2013 1410    Alcohol Level: No results for input(s): ETH in the last 168 hours.   Imaging: Ct Head (brain) Wo Contrast  02/19/2014   CLINICAL DATA:  Code stroke.  Left-sided numbness.  EXAM: CT HEAD WITHOUT CONTRAST  TECHNIQUE: Contiguous axial images were obtained from the base of the skull through the vertex without intravenous contrast.  COMPARISON:  10/30/2013  FINDINGS: Ventricles and sulci are appropriate for patient's age. Old left occipital lobe infarct, stable. No evidence for acute cortically based infarct, intracranial hemorrhage, mass lesion or mass effect. The mastoid air cells are well aerated. Paranasal sinuses are unremarkable. Calvarium is intact.  IMPRESSION: Remote left occipital infarct.  No acute intracranial process.  Critical Value/emergent results were called by telephone at the time of interpretation on 02/19/2014 at 8:01 pm to Dr. Thad Rangereynolds, who verbally acknowledged these results.   Electronically Signed   By: Annia Beltrew  Davis M.D.   On: 02/19/2014 20:01    Assessment: 43 y.o. female presenting with left sided headache, blurred vision and left sided numbness.  Head CT personally reviewed and shows her old infarct but no acute changes.  BP elevated but coming down on its own.  NIHSS of 1.   Patient on Coumadin and ASA at home for her hypercoagulable syndrome but has not taken for the past five days due to an upcoming dental procedure.   Echocardiogram in May shows no intracardiac masses or thrombi.    Stroke Risk Factors - hypercoagulable state and smoking   Plan: 1. HgbA1c, fasting lipid panel 2. MRI of the brain without contrast in AM 3. PT consult, OT consult, Speech consult 4. Echocardiogram 5. Prophylactic therapy-Antiplatelet med: Aspirin - dose 81mg  daily.  Restart Coumadin. 6. NPO until RN stroke swallow screen 7. Telemetry monitoring 8. Frequent neuro checks 9. CT angio of head and neck  Case discussed with Dr. Sharlet SalinaZackowski  Xayden Linsey,  MD [facility] [phone] 02/19/2014, 8:53 PM

## 2014-02-20 ENCOUNTER — Observation Stay (HOSPITAL_COMMUNITY): Payer: Medicaid Other

## 2014-02-20 ENCOUNTER — Encounter (HOSPITAL_COMMUNITY): Payer: Self-pay | Admitting: *Deleted

## 2014-02-20 DIAGNOSIS — Z7982 Long term (current) use of aspirin: Secondary | ICD-10-CM

## 2014-02-20 DIAGNOSIS — D6859 Other primary thrombophilia: Secondary | ICD-10-CM

## 2014-02-20 DIAGNOSIS — I2510 Atherosclerotic heart disease of native coronary artery without angina pectoris: Secondary | ICD-10-CM

## 2014-02-20 DIAGNOSIS — F1410 Cocaine abuse, uncomplicated: Secondary | ICD-10-CM

## 2014-02-20 DIAGNOSIS — Z7901 Long term (current) use of anticoagulants: Secondary | ICD-10-CM

## 2014-02-20 DIAGNOSIS — I639 Cerebral infarction, unspecified: Secondary | ICD-10-CM

## 2014-02-20 DIAGNOSIS — F17210 Nicotine dependence, cigarettes, uncomplicated: Secondary | ICD-10-CM

## 2014-02-20 DIAGNOSIS — I513 Intracardiac thrombosis, not elsewhere classified: Secondary | ICD-10-CM

## 2014-02-20 DIAGNOSIS — I10 Essential (primary) hypertension: Secondary | ICD-10-CM

## 2014-02-20 LAB — LIPID PANEL
CHOL/HDL RATIO: 2.4 ratio
Cholesterol: 200 mg/dL (ref 0–200)
HDL: 82 mg/dL (ref >39)
LDL Cholesterol: 80 mg/dL (ref 0–99)
Triglycerides: 189 mg/dL — ABNORMAL HIGH (ref <150)
VLDL: 38 mg/dL (ref 0–40)

## 2014-02-20 LAB — HEMOGLOBIN A1C
Hgb A1c MFr Bld: 5 % (ref <5.7)
MEAN PLASMA GLUCOSE: 97 mg/dL (ref <117)

## 2014-02-20 MED ORDER — ASPIRIN EC 81 MG PO TBEC
81.0000 mg | DELAYED_RELEASE_TABLET | Freq: Every day | ORAL | Status: DC
  Administered 2014-02-20 – 2014-02-22 (×3): 81 mg via ORAL
  Filled 2014-02-20 (×3): qty 1

## 2014-02-20 MED ORDER — WARFARIN - PHARMACIST DOSING INPATIENT: Freq: Every day | Status: DC

## 2014-02-20 MED ORDER — LORAZEPAM 1 MG PO TABS
1.0000 mg | ORAL_TABLET | Freq: Once | ORAL | Status: AC
  Administered 2014-02-20: 1 mg via ORAL
  Filled 2014-02-20: qty 1

## 2014-02-20 MED ORDER — PNEUMOCOCCAL VAC POLYVALENT 25 MCG/0.5ML IJ INJ
0.5000 mL | INJECTION | INTRAMUSCULAR | Status: AC
  Administered 2014-02-21: 0.5 mL via INTRAMUSCULAR
  Filled 2014-02-20: qty 0.5

## 2014-02-20 MED ORDER — STROKE: EARLY STAGES OF RECOVERY BOOK
Freq: Once | Status: DC
  Filled 2014-02-20: qty 1

## 2014-02-20 MED ORDER — ACETAMINOPHEN 325 MG PO TABS
650.0000 mg | ORAL_TABLET | Freq: Four times a day (QID) | ORAL | Status: DC | PRN
  Administered 2014-02-20 – 2014-02-22 (×5): 650 mg via ORAL
  Filled 2014-02-20 (×5): qty 2

## 2014-02-20 MED ORDER — WARFARIN SODIUM 5 MG PO TABS
10.0000 mg | ORAL_TABLET | Freq: Once | ORAL | Status: AC
  Administered 2014-02-20: 10 mg via ORAL
  Filled 2014-02-20: qty 2

## 2014-02-20 MED ORDER — INFLUENZA VAC SPLIT QUAD 0.5 ML IM SUSY
0.5000 mL | PREFILLED_SYRINGE | INTRAMUSCULAR | Status: AC
  Administered 2014-02-21: 0.5 mL via INTRAMUSCULAR
  Filled 2014-02-20: qty 0.5

## 2014-02-20 MED ORDER — ATORVASTATIN CALCIUM 80 MG PO TABS
80.0000 mg | ORAL_TABLET | Freq: Every day | ORAL | Status: DC
  Administered 2014-02-21 – 2014-02-22 (×2): 80 mg via ORAL
  Filled 2014-02-20 (×2): qty 1

## 2014-02-20 MED ORDER — ATORVASTATIN CALCIUM 40 MG PO TABS
40.0000 mg | ORAL_TABLET | Freq: Every day | ORAL | Status: DC
  Administered 2014-02-20: 40 mg via ORAL
  Filled 2014-02-20: qty 1

## 2014-02-20 NOTE — Progress Notes (Signed)
Subjective: Doing well today. Came in yesterday with left sided numbness on face, mouth, arm, and leg. Also has blurry vision and slurred speech. Also had left sided headache. All starting around 4 PM yesterday. Patient tried to wait to see if symptoms resolved but around 7 pm decided to come to the hospital as she was still having these symptoms. She was anxious as she had similar but worse symptoms before when she had her previous stroke. Was off coumadin for last  6 days after consultation with her cardiologist for her dental extraction that was scheduled for today. Was on coumadin for her apical mural thrormbus seen on May 2015. Was on plavix and asa before for antithrombin III deficiency. It was diagnosed in the past but patient is not sure. Regardless, she needs to be on coumadin for the apical mural thrombus.  She feels fine today. Has some mild headache. Numbness, weakness, slurred speech all have resolved.  Doesn't have any complaints. Denies any sob, cp, cough, n/v, fever, chills, headache, numbness, weakness, tingling.   Objective: Vital signs in last 24 hours: Filed Vitals:   02/20/14 0822 02/20/14 0946 02/20/14 1037 02/20/14 1300  BP: 136/98 139/90 112/78 135/83  Pulse: 81 88 75 80  Temp: 98.6 F (37 C) 98.2 F (36.8 C) 98.4 F (36.9 C) 98.8 F (37.1 C)  TempSrc: Oral Oral Oral Oral  Resp: 16 18 18 18   Height:      Weight:      SpO2: 99% 98% 100% 100%   Weight change:  No intake or output data in the 24 hours ending 02/20/14 1427 Vitals reviewed. General: resting in bed, NAD HEENT: PERRL, EOMI, no scleral icterus Cardiac: RRR, no rubs, murmurs or gallops Pulm: clear to auscultation bilaterally, no wheezes, rales, or rhonchi Abd: soft, nontender, nondistended, BS present Ext: warm and well perfused, no pedal edema. 4/5 strength on left LE (this is baseline for her), 5/5 on all other ext's. sensation intact.  Neuro: alert and oriented X3, cranial nerves II-XII grossly  intact  Lab Results: Basic Metabolic Panel:  Recent Labs Lab 02/19/14 1944  NA 138  K 3.8  CL 100  CO2 21  GLUCOSE 93  BUN 7  CREATININE 0.62  CALCIUM 9.0   Liver Function Tests:  Recent Labs Lab 02/19/14 1944  AST 56*  ALT 21  ALKPHOS 114  BILITOT 0.5  PROT 7.5  ALBUMIN 3.7   No results for input(s): LIPASE, AMYLASE in the last 168 hours. No results for input(s): AMMONIA in the last 168 hours. CBC:  Recent Labs Lab 02/19/14 1944  WBC 9.0  NEUTROABS 7.6  HGB 11.8*  HCT 34.9*  MCV 94.1  PLT 217   Cardiac Enzymes: No results for input(s): CKTOTAL, CKMB, CKMBINDEX, TROPONINI in the last 168 hours. BNP: No results for input(s): PROBNP in the last 168 hours. D-Dimer: No results for input(s): DDIMER in the last 168 hours. CBG:  Recent Labs Lab 02/19/14 2003  GLUCAP 89   Hemoglobin A1C: No results for input(s): HGBA1C in the last 168 hours. Fasting Lipid Panel:  Recent Labs Lab 02/20/14 0624  CHOL 200  HDL 82  LDLCALC 80  TRIG 189*  CHOLHDL 2.4   Thyroid Function Tests: No results for input(s): TSH, T4TOTAL, FREET4, T3FREE, THYROIDAB in the last 168 hours. Coagulation:  Recent Labs Lab 02/19/14 1944  LABPROT 13.3  INR 1.00   Anemia Panel: No results for input(s): VITAMINB12, FOLATE, FERRITIN, TIBC, IRON, RETICCTPCT in the last 168  hours. Urine Drug Screen: Drugs of Abuse     Component Value Date/Time   LABOPIA NONE DETECTED 02/19/2014 2200   COCAINSCRNUR NONE DETECTED 02/19/2014 2200   LABBENZ NONE DETECTED 02/19/2014 2200   AMPHETMU NONE DETECTED 02/19/2014 2200   THCU NONE DETECTED 02/19/2014 2200   LABBARB NONE DETECTED 02/19/2014 2200    Alcohol Level: No results for input(s): ETH in the last 168 hours. Urinalysis: No results for input(s): COLORURINE, LABSPEC, PHURINE, GLUCOSEU, HGBUR, BILIRUBINUR, KETONESUR, PROTEINUR, UROBILINOGEN, NITRITE, LEUKOCYTESUR in the last 168 hours.  Invalid input(s): APPERANCEUR Misc.  Labs:  Micro Results: No results found for this or any previous visit (from the past 240 hour(s)). Studies/Results: Ct Angio Head W/cm &/or Wo Cm  02/19/2014   CLINICAL DATA:  Initial valuation for acute left-sided headache, numbness, vision change. Gait impairment.  EXAM: CT ANGIOGRAPHY HEAD AND NECK  TECHNIQUE: Multidetector CT imaging of the head and neck was performed using the standard protocol during bolus administration of intravenous contrast. Multiplanar CT image reconstructions and MIPs were obtained to evaluate the vascular anatomy. Carotid stenosis measurements (when applicable) are obtained utilizing NASCET criteria, using the distal internal carotid diameter as the denominator.  CONTRAST:  50mL OMNIPAQUE IOHEXOL 350 MG/ML SOLN  COMPARISON:  Prior noncontrast head CT performed on the same day.  FINDINGS: CTA HEAD FINDINGS  ANTERIOR CIRCULATION:  The petrous, cavernous, and supra clinoid segments of the internal carotid arteries are widely patent bilaterally. A1 segments symmetric in well opacified. Anterior communicating artery within normal limits. Anterior cerebral arteries well opacified.  M1 segments widely patent bilaterally without significant stenosis or proximal branch occlusion. Distal MCA branches well opacified.  POSTERIOR CIRCULATION:  Vertebral arteries are codominant. Posterior inferior cerebellar arteries opacified bilaterally. Vertebrobasilar junction and basilar artery are widely patent. Superior cerebellar arteries are patent. The right P1 and P2 segments are well opacified without significant stenosis or proximal branch occlusion. Left P1 segment well opacified. Flow within the distal left posterior cerebral artery mildly attenuated, likely related to remote left occipital infarct.  No aneurysm or vascular malformation. No abnormal enhancement on delayed sequence.  CTA NECK FINDINGS  The visualized aortic arch is of normal caliber with normal 3 vessel morphology. No high-grade  stenosis seen at the origin of the great vessels. Subclavian arteries widely patent.  Common carotid artery is well opacified and are symmetric in caliber. Carotid bifurcations within normal limits. Internal carotid arteries are well opacified to the skullbase without hemodynamically significant stenosis, dissection, or occlusion.  External carotid arteries and their branches are within normal limits.  Both vertebral arteries arise from the subclavian arteries. Vertebral arteries are well opacified along their entire course without evidence of dissection or significant stenosis.  No soft tissue abnormality identified within the neck. No adenopathy. Thyroid gland is normal. Visualized superior mediastinum within normal limits. Visualized lungs are clear.  No acute osseous abnormality. Reversal of the normal cervical lordosis likely related to patient positioning. No worrisome lytic or blastic osseous lesions.  IMPRESSION: 1. Unremarkable CTA of the neck with no evidence for hemodynamically significant stenosis, dissection, or other abnormality. 2. No proximal branch occlusion or hemodynamically significant stenosis within the intracranial circulation. There is mildly attenuated flow within the distal left PCA, likely related to remote left occipital infarct.   Electronically Signed   By: Rise Mu M.D.   On: 02/19/2014 22:00   Ct Head (brain) Wo Contrast  02/19/2014   CLINICAL DATA:  Code stroke.  Left-sided numbness.  EXAM: CT HEAD  WITHOUT CONTRAST  TECHNIQUE: Contiguous axial images were obtained from the base of the skull through the vertex without intravenous contrast.  COMPARISON:  10/30/2013  FINDINGS: Ventricles and sulci are appropriate for patient's age. Old left occipital lobe infarct, stable. No evidence for acute cortically based infarct, intracranial hemorrhage, mass lesion or mass effect. The mastoid air cells are well aerated. Paranasal sinuses are unremarkable. Calvarium is intact.   IMPRESSION: Remote left occipital infarct.  No acute intracranial process.  Critical Value/emergent results were called by telephone at the time of interpretation on 02/19/2014 at 8:01 pm to Dr. Thad Ranger, who verbally acknowledged these results.   Electronically Signed   By: Annia Belt M.D.   On: 02/19/2014 20:01   Ct Angio Neck W/cm &/or Wo/cm  02/19/2014   CLINICAL DATA:  Initial valuation for acute left-sided headache, numbness, vision change. Gait impairment.  EXAM: CT ANGIOGRAPHY HEAD AND NECK  TECHNIQUE: Multidetector CT imaging of the head and neck was performed using the standard protocol during bolus administration of intravenous contrast. Multiplanar CT image reconstructions and MIPs were obtained to evaluate the vascular anatomy. Carotid stenosis measurements (when applicable) are obtained utilizing NASCET criteria, using the distal internal carotid diameter as the denominator.  CONTRAST:  44mL OMNIPAQUE IOHEXOL 350 MG/ML SOLN  COMPARISON:  Prior noncontrast head CT performed on the same day.  FINDINGS: CTA HEAD FINDINGS  ANTERIOR CIRCULATION:  The petrous, cavernous, and supra clinoid segments of the internal carotid arteries are widely patent bilaterally. A1 segments symmetric in well opacified. Anterior communicating artery within normal limits. Anterior cerebral arteries well opacified.  M1 segments widely patent bilaterally without significant stenosis or proximal branch occlusion. Distal MCA branches well opacified.  POSTERIOR CIRCULATION:  Vertebral arteries are codominant. Posterior inferior cerebellar arteries opacified bilaterally. Vertebrobasilar junction and basilar artery are widely patent. Superior cerebellar arteries are patent. The right P1 and P2 segments are well opacified without significant stenosis or proximal branch occlusion. Left P1 segment well opacified. Flow within the distal left posterior cerebral artery mildly attenuated, likely related to remote left occipital infarct.   No aneurysm or vascular malformation. No abnormal enhancement on delayed sequence.  CTA NECK FINDINGS  The visualized aortic arch is of normal caliber with normal 3 vessel morphology. No high-grade stenosis seen at the origin of the great vessels. Subclavian arteries widely patent.  Common carotid artery is well opacified and are symmetric in caliber. Carotid bifurcations within normal limits. Internal carotid arteries are well opacified to the skullbase without hemodynamically significant stenosis, dissection, or occlusion.  External carotid arteries and their branches are within normal limits.  Both vertebral arteries arise from the subclavian arteries. Vertebral arteries are well opacified along their entire course without evidence of dissection or significant stenosis.  No soft tissue abnormality identified within the neck. No adenopathy. Thyroid gland is normal. Visualized superior mediastinum within normal limits. Visualized lungs are clear.  No acute osseous abnormality. Reversal of the normal cervical lordosis likely related to patient positioning. No worrisome lytic or blastic osseous lesions.  IMPRESSION: 1. Unremarkable CTA of the neck with no evidence for hemodynamically significant stenosis, dissection, or other abnormality. 2. No proximal branch occlusion or hemodynamically significant stenosis within the intracranial circulation. There is mildly attenuated flow within the distal left PCA, likely related to remote left occipital infarct.   Electronically Signed   By: Rise Mu M.D.   On: 02/19/2014 22:00   Medications: I have reviewed the patient's current medications. Scheduled Meds: .  stroke: mapping  our early stages of recovery book   Does not apply Once  . aspirin EC  81 mg Oral Daily  . atorvastatin  40 mg Oral Daily  . [START ON 02/21/2014] Influenza vac split quadrivalent PF  0.5 mL Intramuscular Tomorrow-1000  . labetalol  10 mg Intravenous Once  . LORazepam  1 mg Oral Once   . [START ON 02/21/2014] pneumococcal 23 valent vaccine  0.5 mL Intramuscular Tomorrow-1000  . warfarin  10 mg Oral ONCE-1800  . Warfarin - Pharmacist Dosing Inpatient   Does not apply q1800   Continuous Infusions:  PRN Meds:.acetaminophen Assessment/Plan: Principal Problem:   TIA (transient ischemic attack) Active Problems:   Coronary Artery Disease   Antithrombin III deficiency   Long term current use of anticoagulant therapy   Headache   Stroke  43 yo femle with hx of CVA, CAD, HTN, antithrombin III deficiency, and Apical mural thrombus seen on May 2015, here with TIA. Patient has been off coumadin for 6 days for scheduled dental extraction.   TIA - patient had left sided headache, Left sided weakness, numbness, blurred vision, slurred speech. CT head showed old remote left occipital infarct. CTA head/neck negative for acute changes. The symptoms all resolved suggesting TIA. - off note, patient had been off couamdin for ~ 6 days for dental surgery scheduled for today. This is unlikely the precipitant since CT doesn't show any new embolic stroke. TIA was likely due for some other cause, such as vessel stenosis or hypotension.  - MRI brain pending. Stroke team on board. F/up their recs. - main thing would be risk factor modification -check hgba1c (last 5.2 on may 2015). Lipid panel shows LDL 80.  - will increase lipitor from 40 to 80 daily for better LDL lowering (<70 goal). Cont asa. - encourage smoking cessation - continue coumadin for now. in the future, if he has procedures, he will likely need to bridge with lovenox.  CAD - apical LAD infarct 2000, NSTEMi s/p BMS prox LAD 2009.  - is on 81mg  daily asa daily. Cont this. - increased lipitor from 40 to 80mg  daily. Patient has not been taking lipitor regularly.   HTN - had been on coreg in the past but patient wasn't taking it. Dr. Jens Som switched her to Toprol XL 25 qdaily but patient wasn't taking it either. She was having  palpitations, that was likely why toprol was chosen.  - will continue toprol as we are allowing permissing HTN now and her BP is under control.  Antithrombin III Deficiency - unclear history. No official record showing the diagnosis. Per Dr. Myna Hidalgo hem-onc previously, this may not be accurate diagnosis. She may have protein C deificny.  - regardless, she needs to be on coumadin for her apical mural thrombus seen on May 2015  Apical mural thrombus - seen on echo 08/02/2013.  - continue coumadin which was held for dental extraction. In the future, will need to have lovenox bridging during procedures for which coumadin needs to be held.  Tobacco abuse - smokes 1/4 ppd. Also hx of cocaine abuse UDS negative this time but was + 3 months ago  - encourage smoking and cocaine cessation.  Dispo: Disposition is deferred at this time, awaiting improvement of current medical problems.  Anticipated discharge in approximately 1-2 day(s).   The patient does have a current PCP (Ejiroghene Wendall Stade, MD) and does need an [facility] hospital follow-up appointment after discharge.  The patient does have transportation limitations that hinder transportation to clinic  appointments.  .Services Needed at time of discharge: Y = Yes, Blank = No PT:   OT:   RN:   Equipment:   Other:     LOS: 1 day   Hyacinth Meeker, MD 02/20/2014, 2:27 PM

## 2014-02-20 NOTE — Progress Notes (Signed)
Bilateral carotid artery duplex completed:  1-39% ICA stenosis.  Vertebral artery flow is antegrade.     

## 2014-02-20 NOTE — Plan of Care (Signed)
Problem: Acute Treatment Outcomes Goal: Neuro exam at baseline or improved Outcome: Progressing Goal: BP within ordered parameters Outcome: Progressing Goal: Airway maintained/protected Outcome: Completed/Met Date Met:  02/20/14 Goal: 02 Sats > 94% Outcome: Completed/Met Date Met:  02/20/14 Goal: Hemodynamically stable Outcome: Progressing  Problem: Progression Outcomes Goal: Communication method established Outcome: Completed/Met Date Met:  02/20/14 Goal: Progressive activity as tolerated Outcome: Completed/Met Date Met:  02/20/14 Goal: Pain controlled Outcome: Completed/Met Date Met:  02/20/14 Goal: Bowel & Bladder Continence Outcome: Completed/Met Date Met:  02/20/14 Goal: Educational plan initiated Outcome: Completed/Met Date Met:  02/20/14

## 2014-02-20 NOTE — Progress Notes (Signed)
UR completed 

## 2014-02-20 NOTE — Progress Notes (Signed)
ANTICOAGULATION CONSULT NOTE - Initial Consult  Pharmacy Consult:  Coumadin Indication:  History of apical mural thrombus  No Known Allergies  Patient Measurements: Height: 5\' 6"  (167.6 cm) Weight: 164 lb (74.39 kg) IBW/kg (Calculated) : 59.3  Vital Signs: Temp: 98.8 F (37.1 C) (12/07 1300) Temp Source: Oral (12/07 1300) BP: 135/83 mmHg (12/07 1300) Pulse Rate: 80 (12/07 1300)  Labs:  Recent Labs  02/19/14 1944  HGB 11.8*  HCT 34.9*  PLT 217  APTT 26  LABPROT 13.3  INR 1.00  CREATININE 0.62    Estimated Creatinine Clearance: 91.5 mL/min (by C-G formula based on Cr of 0.62).   Medical History: Past Medical History  Diagnosis Date  . Stroke     assoc with short term memory loss and right peripheral vision loss; age 28   . Blood transfusion   . Anxiety   . Coronary artery disease     Apical LAD infarction '00; NSTEMI s/p BMS to prox LAD '09; Cath 12/2011 single vessel CAD w/ patent LAD stent w/ stable mild ISR, nl LV systolic fxn  . Anemia   . Antithrombin III deficiency     ?pt not sure if true diagnosis  . Tobacco abuse   . High blood pressure   . Myocardial infarction       Assessment: 43 YOF on Coumadin PTA for history of apical mural thrombus.  Her Coumadin has been on hold for a planned tooth extraction.  Patient presented with stroke-like symptom but is ruled out.  Pharmacy consulted to resume Coumadin.  INR at baseline.   Goal of Therapy:  INR 2-3    Plan:  - Coumadin 10mg  PO today - Daily PT / INR    Shivani Barrantes D. Laney Potash, PharmD, BCPS Pager:  [phone] 02/20/2014, 1:52 PM

## 2014-02-20 NOTE — Progress Notes (Signed)
Patient just arrived to the unit from ED, alert and oriented. Pt oriented to room and equipment. NAD noted. VSS. Headache of 7/10 reported. Admitting MD paged. Will monitor closely.

## 2014-02-20 NOTE — Plan of Care (Signed)
Problem: Acute Treatment Outcomes Goal: Neuro exam at baseline or improved Outcome: Completed/Met Date Met:  02/20/14 Goal: BP within ordered parameters Outcome: Completed/Met Date Met:  02/20/14 Goal: Hemodynamically stable Outcome: Progressing Goal: Prognosis discussed with family/patient as appropriate Outcome: Progressing Goal: Other Acute Treatment Outcomes Outcome: Progressing

## 2014-02-20 NOTE — Progress Notes (Signed)
STROKE TEAM PROGRESS NOTE   HISTORY Lindsey [name] is an 43 y.o. female who was at home and had acute onset left sided headache on 02/19/2014 at 1600. Patient then noted blurred vision and left sided numbness. With no resolution of symptoms EMS was called. Patient had impairment of gait on their arrival. Patient was brought in as a code stroke. BP was elevated. BS was normal. Patient has no history of hypertension. On presentation NIHSS was 1. Headache worsened to involve the whole head.Described as a squeezing sensation and rated at a 5/10. Patient was not administered TPA secondary to nondisabling symptomatology. She was admitted for further evaluation and treatment.   SUBJECTIVE (INTERVAL HISTORY) No family is at the bedside.  Overall she feels her condition is stable.    OBJECTIVE Temp:  [98.2 F (36.8 C)-99.2 F (37.3 C)] 98.4 F (36.9 C) (12/07 1037) Pulse Rate:  [66-95] 75 (12/07 1037) Cardiac Rhythm:  [-] Normal sinus rhythm (12/07 0822) Resp:  [15-29] 18 (12/07 1037) BP: (112-145)/(72-104) 112/78 mmHg (12/07 1037) SpO2:  [97 %-100 %] 100 % (12/07 1037) Weight:  [74.39 kg (164 lb)] 74.39 kg (164 lb) (12/06 2000)   Recent Labs Lab 02/19/14 2003  GLUCAP 89    Recent Labs Lab 02/19/14 1944  NA 138  K 3.8  CL 100  CO2 21  GLUCOSE 93  BUN 7  CREATININE 0.62  CALCIUM 9.0    Recent Labs Lab 02/19/14 1944  AST 56*  ALT 21  ALKPHOS 114  BILITOT 0.5  PROT 7.5  ALBUMIN 3.7    Recent Labs Lab 02/19/14 1944  WBC 9.0  NEUTROABS 7.6  HGB 11.8*  HCT 34.9*  MCV 94.1  PLT 217   No results for input(s): CKTOTAL, CKMB, CKMBINDEX, TROPONINI in the last 168 hours.  Recent Labs  02/19/14 1944  LABPROT 13.3  INR 1.00   No results for input(s): COLORURINE, LABSPEC, PHURINE, GLUCOSEU, HGBUR, BILIRUBINUR, KETONESUR, PROTEINUR, UROBILINOGEN, NITRITE, LEUKOCYTESUR in the last 72 hours.  Invalid input(s): APPERANCEUR     Component Value Date/Time   CHOL  200 02/20/2014 0624   TRIG 189* 02/20/2014 0624   HDL 82 02/20/2014 0624   CHOLHDL 2.4 02/20/2014 0624   VLDL 38 02/20/2014 0624   LDLCALC 80 02/20/2014 0624   Lab Results  Component Value Date   HGBA1C 5.0 02/20/2014      Component Value Date/Time   LABOPIA NONE DETECTED 02/19/2014 2200   COCAINSCRNUR NONE DETECTED 02/19/2014 2200   LABBENZ NONE DETECTED 02/19/2014 2200   AMPHETMU NONE DETECTED 02/19/2014 2200   THCU NONE DETECTED 02/19/2014 2200   LABBARB NONE DETECTED 02/19/2014 2200    No results for input(s): ETH in the last 168 hours.  Ct Angio Head W/cm &/or Wo Cm  02/19/2014   CLINICAL DATA:  Initial valuation for acute left-sided headache, numbness, vision change. Gait impairment.  EXAM: CT ANGIOGRAPHY HEAD AND NECK  TECHNIQUE: Multidetector CT imaging of the head and neck was performed using the standard protocol during bolus administration of intravenous contrast. Multiplanar CT image reconstructions and MIPs were obtained to evaluate the vascular anatomy. Carotid stenosis measurements (when applicable) are obtained utilizing NASCET criteria, using the distal internal carotid diameter as the denominator.  CONTRAST:  69mL OMNIPAQUE IOHEXOL 350 MG/ML SOLN  COMPARISON:  Prior noncontrast head CT performed on the same day.  FINDINGS: CTA HEAD FINDINGS  ANTERIOR CIRCULATION:  The petrous, cavernous, and supra clinoid segments of the internal carotid arteries are widely patent bilaterally.  A1 segments symmetric in well opacified. Anterior communicating artery within normal limits. Anterior cerebral arteries well opacified.  M1 segments widely patent bilaterally without significant stenosis or proximal branch occlusion. Distal MCA branches well opacified.  POSTERIOR CIRCULATION:  Vertebral arteries are codominant. Posterior inferior cerebellar arteries opacified bilaterally. Vertebrobasilar junction and basilar artery are widely patent. Superior cerebellar arteries are patent. The right P1  and P2 segments are well opacified without significant stenosis or proximal branch occlusion. Left P1 segment well opacified. Flow within the distal left posterior cerebral artery mildly attenuated, likely related to remote left occipital infarct.  No aneurysm or vascular malformation. No abnormal enhancement on delayed sequence.  CTA NECK FINDINGS  The visualized aortic arch is of normal caliber with normal 3 vessel morphology. No high-grade stenosis seen at the origin of the great vessels. Subclavian arteries widely patent.  Common carotid artery is well opacified and are symmetric in caliber. Carotid bifurcations within normal limits. Internal carotid arteries are well opacified to the skullbase without hemodynamically significant stenosis, dissection, or occlusion.  External carotid arteries and their branches are within normal limits.  Both vertebral arteries arise from the subclavian arteries. Vertebral arteries are well opacified along their entire course without evidence of dissection or significant stenosis.  No soft tissue abnormality identified within the neck. No adenopathy. Thyroid gland is normal. Visualized superior mediastinum within normal limits. Visualized lungs are clear.  No acute osseous abnormality. Reversal of the normal cervical lordosis likely related to patient positioning. No worrisome lytic or blastic osseous lesions.  IMPRESSION: 1. Unremarkable CTA of the neck with no evidence for hemodynamically significant stenosis, dissection, or other abnormality. 2. No proximal branch occlusion or hemodynamically significant stenosis within the intracranial circulation. There is mildly attenuated flow within the distal left PCA, likely related to remote left occipital infarct.   Electronically Signed   By: Rise Mu M.D.   On: 02/19/2014 22:00   Ct Head (brain) Wo Contrast  02/19/2014   CLINICAL DATA:  Code stroke.  Left-sided numbness.  EXAM: CT HEAD WITHOUT CONTRAST  TECHNIQUE:  Contiguous axial images were obtained from the base of the skull through the vertex without intravenous contrast.  COMPARISON:  10/30/2013  FINDINGS: Ventricles and sulci are appropriate for patient's age. Old left occipital lobe infarct, stable. No evidence for acute cortically based infarct, intracranial hemorrhage, mass lesion or mass effect. The mastoid air cells are well aerated. Paranasal sinuses are unremarkable. Calvarium is intact.  IMPRESSION: Remote left occipital infarct.  No acute intracranial process.  Critical Value/emergent results were called by telephone at the time of interpretation on 02/19/2014 at 8:01 pm to Dr. Thad Ranger, who verbally acknowledged these results.   Electronically Signed   By: Annia Belt M.D.   On: 02/19/2014 20:01   Ct Angio Neck W/cm &/or Wo/cm  02/19/2014   CLINICAL DATA:  Initial valuation for acute left-sided headache, numbness, vision change. Gait impairment.  EXAM: CT ANGIOGRAPHY HEAD AND NECK  TECHNIQUE: Multidetector CT imaging of the head and neck was performed using the standard protocol during bolus administration of intravenous contrast. Multiplanar CT image reconstructions and MIPs were obtained to evaluate the vascular anatomy. Carotid stenosis measurements (when applicable) are obtained utilizing NASCET criteria, using the distal internal carotid diameter as the denominator.  CONTRAST:  50mL OMNIPAQUE IOHEXOL 350 MG/ML SOLN  COMPARISON:  Prior noncontrast head CT performed on the same day.  FINDINGS: CTA HEAD FINDINGS  ANTERIOR CIRCULATION:  The petrous, cavernous, and supra clinoid segments of the  internal carotid arteries are widely patent bilaterally. A1 segments symmetric in well opacified. Anterior communicating artery within normal limits. Anterior cerebral arteries well opacified.  M1 segments widely patent bilaterally without significant stenosis or proximal branch occlusion. Distal MCA branches well opacified.  POSTERIOR CIRCULATION:  Vertebral arteries  are codominant. Posterior inferior cerebellar arteries opacified bilaterally. Vertebrobasilar junction and basilar artery are widely patent. Superior cerebellar arteries are patent. The right P1 and P2 segments are well opacified without significant stenosis or proximal branch occlusion. Left P1 segment well opacified. Flow within the distal left posterior cerebral artery mildly attenuated, likely related to remote left occipital infarct.  No aneurysm or vascular malformation. No abnormal enhancement on delayed sequence.  CTA NECK FINDINGS  The visualized aortic arch is of normal caliber with normal 3 vessel morphology. No high-grade stenosis seen at the origin of the great vessels. Subclavian arteries widely patent.  Common carotid artery is well opacified and are symmetric in caliber. Carotid bifurcations within normal limits. Internal carotid arteries are well opacified to the skullbase without hemodynamically significant stenosis, dissection, or occlusion.  External carotid arteries and their branches are within normal limits.  Both vertebral arteries arise from the subclavian arteries. Vertebral arteries are well opacified along their entire course without evidence of dissection or significant stenosis.  No soft tissue abnormality identified within the neck. No adenopathy. Thyroid gland is normal. Visualized superior mediastinum within normal limits. Visualized lungs are clear.  No acute osseous abnormality. Reversal of the normal cervical lordosis likely related to patient positioning. No worrisome lytic or blastic osseous lesions.  IMPRESSION: 1. Unremarkable CTA of the neck with no evidence for hemodynamically significant stenosis, dissection, or other abnormality. 2. No proximal branch occlusion or hemodynamically significant stenosis within the intracranial circulation. There is mildly attenuated flow within the distal left PCA, likely related to remote left occipital infarct.   Electronically Signed   By:  Rise Mu M.D.   On: 02/19/2014 22:00   Carotid Doppler  There is 1-39% bilateral ICA stenosis. Vertebral artery flow is antegrade.    PHYSICAL EXAM Obese middle aged lady.Awake alert. Afebrile. Head is nontraumatic. Neck is supple without bruit. Hearing is normal. Cardiac exam no murmur or gallop. Lungs are clear to auscultation. Distal pulses are well felt. Neurological Exam ;  Awake  Alert oriented x 3. Normal speech and language.eye movements full without nystagmus.fundi were not visualized. Vision acuity and fields appear normal. Hearing is normal. Palatal movements are normal. Face symmetric. Tongue midline. Normal strength, tone, reflexes and coordination. Normal sensation. Gait deferred. ASSESSMENT/PLAN Lindsey [name] is a 43 y.o. female with history of hypercoagulable state and previous stroke on coumadin, presenting with headache, blurred vision and left sided numbness while off coumadin for dental procedure (tooth extraction). She did not receive IV t-PA  due to nondisabling symptomotology.   TIA while off coumdain   MRI  pending   CT angio head Unremarkable  CT angio neck Unremarkable   Carotid Doppler  No significant stenosis   2D Echo  Not ordered  HgbA1c 5.0  Warfarin for VTE prophylaxis  Diet Heart thin liquids  aspirin 81 mg orally every day and warfarin prior to admission for apical thrombus diagnosed in April 2015, now on aspirin 81 mg orally every day and warfarin  Therapy recommendations:  No therapy needs  Disposition:  Return home  Hypertension  Stable  Hyperlipidemia  Home meds:  lipitor 40  Increased to lipitor 80 mg in hospital  resumed in  hospital  LDL 80, goal < 70  Continue statin at discharge  Other Stroke Risk Factors  Cigarette smoker, advised to stop smoking  Hx COCAINE use, UDS neg this admission (positive in Aug)  Hx stroke/TIA - at age 43, 78assoc with short term memory loss and right peripheral vision  los  CAD s/p apical infarct in 2000 and NSTEMI in 2009 tx with BMS to LA  ? Anti-thrombin III deficiency (has seen Dr. Myna HidalgoEnnever in the past who rechecked and it was negative)  Admitted in 07/2013 with pancreatitis. Echo demonstrated apical mural thrombus. She was placed on coumadin. ASA and Plavix were d/c'd. [facility].   Other Active Problems  anxiety  Hospital day # 1  BIBY,SHARON  [facility] See Amion for Pager information 02/20/2014 4:37 PM   I have personally examined this patient, reviewed notes, independently viewed imaging studies, participated in medical decision making and plan of care. I have made any additions or clarifications directly to the above note. Agree with note above. The patient's symptoms of transient headache, blurred vision.dysarthria may represent complicated migraine versus posterior circulation TIA. Given p/h/o TIA will finish neurovascular work up.  Delia HeadyPramod Sethi, MD Medical Director [facility] Pager: [phone] 02/20/2014 8:02 PM   To contact Stroke Continuity provider, please refer to [email or web address]. After hours, contact General Neurology

## 2014-02-21 ENCOUNTER — Other Ambulatory Visit: Payer: Self-pay | Admitting: Internal Medicine

## 2014-02-21 DIAGNOSIS — G459 Transient cerebral ischemic attack, unspecified: Secondary | ICD-10-CM

## 2014-02-21 DIAGNOSIS — I513 Intracardiac thrombosis, not elsewhere classified: Secondary | ICD-10-CM

## 2014-02-21 LAB — PROTIME-INR
INR: 0.99 (ref 0.00–1.49)
PROTHROMBIN TIME: 13.2 s (ref 11.6–15.2)

## 2014-02-21 LAB — CLOSTRIDIUM DIFFICILE BY PCR: Toxigenic C. Difficile by PCR: NEGATIVE

## 2014-02-21 MED ORDER — IBUPROFEN 600 MG PO TABS
600.0000 mg | ORAL_TABLET | Freq: Four times a day (QID) | ORAL | Status: DC | PRN
  Administered 2014-02-21 – 2014-02-22 (×3): 600 mg via ORAL
  Filled 2014-02-21 (×6): qty 1

## 2014-02-21 MED ORDER — IBUPROFEN 600 MG PO TABS: 600.0000 mg | ORAL_TABLET | Freq: Four times a day (QID) | ORAL | Status: DC | PRN

## 2014-02-21 MED ORDER — ACETAMINOPHEN 325 MG PO TABS: 650.0000 mg | ORAL_TABLET | Freq: Four times a day (QID) | ORAL | Status: DC | PRN

## 2014-02-21 MED ORDER — ATORVASTATIN CALCIUM 40 MG PO TABS: 80.0000 mg | ORAL_TABLET | Freq: Every day | ORAL | Status: DC

## 2014-02-21 MED ORDER — WARFARIN SODIUM 5 MG PO TABS
10.0000 mg | ORAL_TABLET | Freq: Once | ORAL | Status: AC
  Administered 2014-02-21: 10 mg via ORAL
  Filled 2014-02-21: qty 2

## 2014-02-21 MED ORDER — ENOXAPARIN SODIUM 120 MG/0.8ML ~~LOC~~ SOLN
1.5000 mg/kg | SUBCUTANEOUS | Status: DC
  Administered 2014-02-21: 110 mg via SUBCUTANEOUS
  Filled 2014-02-21 (×3): qty 0.8

## 2014-02-21 NOTE — Progress Notes (Signed)
ANTICOAGULATION CONSULT NOTE - FOLLOW UP  Pharmacy Consult:  Coumadin Indication:  History of apical mural thrombus  No Known Allergies  Patient Measurements: Height: 5\' 6"  (167.6 cm) Weight: 164 lb (74.39 kg) IBW/kg (Calculated) : 59.3  Vital Signs: Temp: 98.6 F (37 C) (12/08 0930) Temp Source: Oral (12/08 0930) BP: 125/80 mmHg (12/08 0930) Pulse Rate: 77 (12/08 0930)  Labs:  Recent Labs  02/19/14 1944 02/21/14 0458  HGB 11.8*  --   HCT 34.9*  --   PLT 217  --   APTT 26  --   LABPROT 13.3 13.2  INR 1.00 0.99  CREATININE 0.62  --     Estimated Creatinine Clearance: 91.5 mL/min (by C-G formula based on Cr of 0.62).    Assessment: Lindsey [name] on Coumadin PTA for history of apical mural thrombus.  Her Coumadin has been on hold for a planned tooth extraction.  Patient presented with stroke-like symptom but is ruled out.  Pharmacy consulted to resume Coumadin on 02/20/14 and INR remains sub-therapeutic as expected.  No bleeding reported.  Home dose:  7.5mg  PO daily   Goal of Therapy:  INR 2-3    Plan:  - Repeat Coumadin 10mg  PO today - Daily PT / INR    Antwuan Eckley D. Laney Potash, PharmD, BCPS Pager:  [phone] 02/21/2014, 11:06 AM

## 2014-02-21 NOTE — Progress Notes (Signed)
STROKE TEAM PROGRESS NOTE   HISTORY Lindsey [name] is an 43 y.o. female who was at home and had acute onset left sided headache on 02/19/2014 at 1600. Patient then noted blurred vision and left sided numbness. With no resolution of symptoms EMS was called. Patient had impairment of gait on their arrival. Patient was brought in as a code stroke. BP was elevated. BS was normal. Patient has no history of hypertension. On presentation NIHSS was 1. Headache worsened to involve the whole head.Described as a squeezing sensation and rated at a 5/10. Patient was not administered TPA secondary to nondisabling symptomatology. She was admitted for further evaluation and treatment.   SUBJECTIVE (INTERVAL HISTORY) Patient sitting up in the bed.   OBJECTIVE Temp:  [98.2 F (36.8 C)-98.6 F (37 C)] 98.6 F (37 C) (12/08 0930) Pulse Rate:  [72-80] 77 (12/08 0930) Cardiac Rhythm:  [-] Normal sinus rhythm (12/08 0800) Resp:  [18-20] 20 (12/08 0930) BP: (122-130)/(72-96) 125/80 mmHg (12/08 0930) SpO2:  [100 %] 100 % (12/08 0930)   Recent Labs Lab 02/19/14 2003  GLUCAP 89    Recent Labs Lab 02/19/14 1944  NA 138  K 3.8  CL 100  CO2 21  GLUCOSE 93  BUN 7  CREATININE 0.62  CALCIUM 9.0    Recent Labs Lab 02/19/14 1944  AST 56*  ALT 21  ALKPHOS 114  BILITOT 0.5  PROT 7.5  ALBUMIN 3.7    Recent Labs Lab 02/19/14 1944  WBC 9.0  NEUTROABS 7.6  HGB 11.8*  HCT 34.9*  MCV 94.1  PLT 217   No results for input(s): CKTOTAL, CKMB, CKMBINDEX, TROPONINI in the last 168 hours.  Recent Labs  02/19/14 1944 02/21/14 0458  LABPROT 13.3 13.2  INR 1.00 0.99   No results for input(s): COLORURINE, LABSPEC, PHURINE, GLUCOSEU, HGBUR, BILIRUBINUR, KETONESUR, PROTEINUR, UROBILINOGEN, NITRITE, LEUKOCYTESUR in the last 72 hours.  Invalid input(s): APPERANCEUR     Component Value Date/Time   CHOL 200 02/20/2014 0624   TRIG 189* 02/20/2014 0624   HDL 82 02/20/2014 0624   CHOLHDL 2.4  02/20/2014 0624   VLDL 38 02/20/2014 0624   LDLCALC 80 02/20/2014 0624   Lab Results  Component Value Date   HGBA1C 5.0 02/20/2014      Component Value Date/Time   LABOPIA NONE DETECTED 02/19/2014 2200   COCAINSCRNUR NONE DETECTED 02/19/2014 2200   LABBENZ NONE DETECTED 02/19/2014 2200   AMPHETMU NONE DETECTED 02/19/2014 2200   THCU NONE DETECTED 02/19/2014 2200   LABBARB NONE DETECTED 02/19/2014 2200    No results for input(s): ETH in the last 168 hours.  Ct Angio Head W/cm &/or Wo Cm  02/19/2014   CLINICAL DATA:  Initial valuation for acute left-sided headache, numbness, vision change. Gait impairment.  EXAM: CT ANGIOGRAPHY HEAD AND NECK  TECHNIQUE: Multidetector CT imaging of the head and neck was performed using the standard protocol during bolus administration of intravenous contrast. Multiplanar CT image reconstructions and MIPs were obtained to evaluate the vascular anatomy. Carotid stenosis measurements (when applicable) are obtained utilizing NASCET criteria, using the distal internal carotid diameter as the denominator.  CONTRAST:  24mL OMNIPAQUE IOHEXOL 350 MG/ML SOLN  COMPARISON:  Prior noncontrast head CT performed on the same day.  FINDINGS: CTA HEAD FINDINGS  ANTERIOR CIRCULATION:  The petrous, cavernous, and supra clinoid segments of the internal carotid arteries are widely patent bilaterally. A1 segments symmetric in well opacified. Anterior communicating artery within normal limits. Anterior cerebral arteries well opacified.  M1 segments widely patent bilaterally without significant stenosis or proximal branch occlusion. Distal MCA branches well opacified.  POSTERIOR CIRCULATION:  Vertebral arteries are codominant. Posterior inferior cerebellar arteries opacified bilaterally. Vertebrobasilar junction and basilar artery are widely patent. Superior cerebellar arteries are patent. The right P1 and P2 segments are well opacified without significant stenosis or proximal branch  occlusion. Left P1 segment well opacified. Flow within the distal left posterior cerebral artery mildly attenuated, likely related to remote left occipital infarct.  No aneurysm or vascular malformation. No abnormal enhancement on delayed sequence.  CTA NECK FINDINGS  The visualized aortic arch is of normal caliber with normal 3 vessel morphology. No high-grade stenosis seen at the origin of the great vessels. Subclavian arteries widely patent.  Common carotid artery is well opacified and are symmetric in caliber. Carotid bifurcations within normal limits. Internal carotid arteries are well opacified to the skullbase without hemodynamically significant stenosis, dissection, or occlusion.  External carotid arteries and their branches are within normal limits.  Both vertebral arteries arise from the subclavian arteries. Vertebral arteries are well opacified along their entire course without evidence of dissection or significant stenosis.  No soft tissue abnormality identified within the neck. No adenopathy. Thyroid gland is normal. Visualized superior mediastinum within normal limits. Visualized lungs are clear.  No acute osseous abnormality. Reversal of the normal cervical lordosis likely related to patient positioning. No worrisome lytic or blastic osseous lesions.  IMPRESSION: 1. Unremarkable CTA of the neck with no evidence for hemodynamically significant stenosis, dissection, or other abnormality. 2. No proximal branch occlusion or hemodynamically significant stenosis within the intracranial circulation. There is mildly attenuated flow within the distal left PCA, likely related to remote left occipital infarct.   Electronically Signed   By: Rise Mu M.D.   On: 02/19/2014 22:00   Ct Head (brain) Wo Contrast  02/19/2014   CLINICAL DATA:  Code stroke.  Left-sided numbness.  EXAM: CT HEAD WITHOUT CONTRAST  TECHNIQUE: Contiguous axial images were obtained from the base of the skull through the vertex  without intravenous contrast.  COMPARISON:  10/30/2013  FINDINGS: Ventricles and sulci are appropriate for patient's age. Old left occipital lobe infarct, stable. No evidence for acute cortically based infarct, intracranial hemorrhage, mass lesion or mass effect. The mastoid air cells are well aerated. Paranasal sinuses are unremarkable. Calvarium is intact.  IMPRESSION: Remote left occipital infarct.  No acute intracranial process.  Critical Value/emergent results were called by telephone at the time of interpretation on 02/19/2014 at 8:01 pm to Dr. Thad Ranger, who verbally acknowledged these results.   Electronically Signed   By: Annia Belt M.D.   On: 02/19/2014 20:01   Ct Angio Neck W/cm &/or Wo/cm  02/19/2014   CLINICAL DATA:  Initial valuation for acute left-sided headache, numbness, vision change. Gait impairment.  EXAM: CT ANGIOGRAPHY HEAD AND NECK  TECHNIQUE: Multidetector CT imaging of the head and neck was performed using the standard protocol during bolus administration of intravenous contrast. Multiplanar CT image reconstructions and MIPs were obtained to evaluate the vascular anatomy. Carotid stenosis measurements (when applicable) are obtained utilizing NASCET criteria, using the distal internal carotid diameter as the denominator.  CONTRAST:  50mL OMNIPAQUE IOHEXOL 350 MG/ML SOLN  COMPARISON:  Prior noncontrast head CT performed on the same day.  FINDINGS: CTA HEAD FINDINGS  ANTERIOR CIRCULATION:  The petrous, cavernous, and supra clinoid segments of the internal carotid arteries are widely patent bilaterally. A1 segments symmetric in well opacified. Anterior communicating artery within normal  limits. Anterior cerebral arteries well opacified.  M1 segments widely patent bilaterally without significant stenosis or proximal branch occlusion. Distal MCA branches well opacified.  POSTERIOR CIRCULATION:  Vertebral arteries are codominant. Posterior inferior cerebellar arteries opacified bilaterally.  Vertebrobasilar junction and basilar artery are widely patent. Superior cerebellar arteries are patent. The right P1 and P2 segments are well opacified without significant stenosis or proximal branch occlusion. Left P1 segment well opacified. Flow within the distal left posterior cerebral artery mildly attenuated, likely related to remote left occipital infarct.  No aneurysm or vascular malformation. No abnormal enhancement on delayed sequence.  CTA NECK FINDINGS  The visualized aortic arch is of normal caliber with normal 3 vessel morphology. No high-grade stenosis seen at the origin of the great vessels. Subclavian arteries widely patent.  Common carotid artery is well opacified and are symmetric in caliber. Carotid bifurcations within normal limits. Internal carotid arteries are well opacified to the skullbase without hemodynamically significant stenosis, dissection, or occlusion.  External carotid arteries and their branches are within normal limits.  Both vertebral arteries arise from the subclavian arteries. Vertebral arteries are well opacified along their entire course without evidence of dissection or significant stenosis.  No soft tissue abnormality identified within the neck. No adenopathy. Thyroid gland is normal. Visualized superior mediastinum within normal limits. Visualized lungs are clear.  No acute osseous abnormality. Reversal of the normal cervical lordosis likely related to patient positioning. No worrisome lytic or blastic osseous lesions.  IMPRESSION: 1. Unremarkable CTA of the neck with no evidence for hemodynamically significant stenosis, dissection, or other abnormality. 2. No proximal branch occlusion or hemodynamically significant stenosis within the intracranial circulation. There is mildly attenuated flow within the distal left PCA, likely related to remote left occipital infarct.   Electronically Signed   By: Rise Mu M.D.   On: 02/19/2014 22:00   Mr Brain Wo  Contrast  02/21/2014   CLINICAL DATA:  LEFT-sided numbness beginning yesterday, blurry vision, slurred speech, headache. Majority of symptoms now resolved. History of LEFT posterior cerebral artery territory infarct. Off Coumadin for 6 days, anticoagulants for apical mural thrombus identified May 2015.  EXAM: MRI HEAD WITHOUT CONTRAST  TECHNIQUE: Multiplanar, multiecho pulse sequences of the brain and surrounding structures were obtained without intravenous contrast.  COMPARISON:  CT angiogram of the head and neck February 19, 2014 and MRI of the head June 29, 2011  FINDINGS: No reduced diffusion to suggest acute ischemia. Minimal susceptibility artifact within the LEFT mesial occipital lobe corresponding to cystic encephalomalacia present on prior imaging. No midline shift or mass effect. Ventricles and sulci are overall normal for patient's age. No suspicious parenchymal signal. No abnormal extra-axial fluid collections. Normal major intracranial vascular flow voids seen at the skull base.  Ocular globes and orbital contents are unremarkable. No paranasal sinus air-fluid levels. Trace fluid signal in the mastoid air cells. No abnormal sellar expansion. No cerebellar tonsillar ectopia. No suspicious calvarial bone marrow signal.  IMPRESSION: No acute intracranial process, specifically no evidence of acute ischemia.  Remote small LEFT posterior cerebral artery territory infarct.   Electronically Signed   By: Awilda Metro   On: 02/21/2014 00:43   Carotid Doppler  There is 1-39% bilateral ICA stenosis. Vertebral artery flow is antegrade.     PHYSICAL EXAM Obese middle aged lady.Awake alert. Afebrile. Head is nontraumatic. Neck is supple without bruit. Hearing is normal. Cardiac exam no murmur or gallop. Lungs are clear to auscultation. Distal pulses are well felt. Neurological Exam ;  Awake  Alert oriented x 3. Normal speech and language.eye movements full without nystagmus.fundi were not visualized.  Vision acuity and fields appear normal. Hearing is normal. Palatal movements are normal. Face symmetric. Tongue midline. Normal strength, tone, reflexes and coordination. Normal sensation. Gait deferred.   ASSESSMENT/PLAN Ms. Daria PasturesLareese W Myricks is a 43 y.o. female with history of hypercoagulable state and previous stroke on coumadin, presenting with headache, blurred vision and left sided numbness while off coumadin for dental procedure (tooth extraction). She did not receive IV t-PA  due to nondisabling symptomotology.   TIA while off coumdain  complicated migraine versus posterior circulation TIA. Given p/h/o TIA will finish neurovascular work up.  MRI  No acute infarct. Old L PCA infarct  CT angio head Unremarkable  CT angio neck Unremarkable   Carotid Doppler  No significant stenosis   2D Echo  Not ordered. Recommend checking to see if there has been resolution of her apical thrombus. If so. Can d/c warfarin.  HgbA1c 5.0  Warfarin for VTE prophylaxis  Diet Heart thin liquids  aspirin 81 mg orally every day and warfarin prior to admission for apical thrombus diagnosed in April 2015, now on aspirin 81 mg orally every day and warfarin. Recommend bridge with lovenox/heparin to theraputic INR with warfarin. If 2D without clot, may consider discontinuing.  Therapy recommendations:  No therapy needs  Disposition:  Return home  Dr. Pearlean BrownieSethi discussed via telephone with resident. Patient is willing/wants to stay in hospital for 2D testing.  Nothing further to add from the stroke standpoint.  Patient has a 10-15% risk of having another stroke over the next year, the highest risk is within 2 weeks of the most recent stroke/TIA (risk of having a stroke following a stroke or TIA is the same).  Ongoing risk factor control by Primary Care Physician  Stroke Service will sign off. Please call should any needs arise.  Follow [facility] Del[facility]ramod [facility] in [facility]  month(s), order placed.  Hypertension  Stable  Hyperlipidemia  Home meds:  lipitor 40  Increased to lipitor 80 mg in hospital  resumed in hospital  LDL 80, goal < 70  Continue statin at discharge  Other Stroke Risk Factors  Cigarette smoker, advised to stop smoking  Hx COCAINE use, UDS neg this admission (positive in Aug)  Hx stroke/TIA - at age 89, 88assoc with short term memory loss and right peripheral vision los  CAD s/p apical infarct in 2000 and NSTEMI in 2009 tx with BMS to LA  ? Anti-thrombin III deficiency (has seen Dr. Myna HidalgoEnnever in the past who rechecked and it was negative)  Admitted in 07/2013 with pancreatitis. Echo demonstrated apical mural thrombus. She was placed on coumadin. ASA and Plavix were d/c'd. [facility].   Other Active Problems  anxiety  Hospital day # 2  Rhoderick MoodyBIBY,SHARON  [facility] See Amion for Pager information 02/21/2014 2:12 PM  I have personally examined this patient, reviewed notes, independently viewed imaging studies, participated in medical decision making and plan of care. I have made any additions or clarifications directly to the above note. Agree with note above. Recommend check follow-up echocardiogram if apical mural thrombus is completely resolved may consider discontinuing Coumadin if okay with cardiologist otherwise continue it. She may need Lovenox bridging. Discussed with internal medicine teaching service resident  Delia HeadyPramod Sethi, MD Medical Director [facility] Pager: [phone] 02/21/2014 8:03 PM    To contact  Stroke Continuity provider, please refer to [email or web address] After hours, contact General Neurology

## 2014-02-21 NOTE — Plan of Care (Signed)
Problem: Acute Treatment Outcomes Goal: Prognosis discussed with family/patient as appropriate Outcome: Completed/Met Date Met:  02/21/14  Problem: Discharge/Transitional Outcomes Goal: Barriers To Progression Addressed/Resolved Outcome: Completed/Met Date Met:  02/21/14 Goal: If vent dependent, trach in place Outcome: Not Applicable Date Met:  51/10/21 Goal: Independent mobility/functioning independent or with min Independent mobility/functioning independently or with minimal assistance  Outcome: Completed/Met Date Met:  02/21/14

## 2014-02-21 NOTE — Discharge Instructions (Addendum)
It was a pleasure taking care of you. You were admitted with possible TIA. You should take your coumadin 10mg  tomorrow and then check your INR at our clinic on Friday. You can walk in to get this lab done. You also have appointment at our clinic for follow up next week.   Follow up with Dr. Jens Som about your Echocardiogram results.  Take all of your other medications as instructed. Take ibuprofen or tylenol for headache.  If your diarrhea gets worse then call the clinic.

## 2014-02-21 NOTE — Plan of Care (Signed)
Problem: Acute Treatment Outcomes Goal: Hemodynamically stable Outcome: Progressing Goal: Other Acute Treatment Outcomes Outcome: Progressing  Problem: Progression Outcomes Goal: Rehab Team goals identified Outcome: Completed/Met Date Met:  02/21/14 Goal: Initial discharge plan initiated Outcome: Completed/Met Date Met:  02/21/14

## 2014-02-21 NOTE — Discharge Summary (Addendum)
Name: Lindsey [name] MRN: [id number] DOB: March 13, 1971 43 y.o. PCP: Onnie Boer, MD  Date of Admission: 02/19/2014  7:40 PM Date of Discharge: 02/22/2014 Attending Physician: Burns Spain, MD  Discharge Diagnosis:  Principal Problem:   TIA (transient ischemic attack) Active Problems:   Coronary Artery Disease   Antithrombin III deficiency   Long term current use of anticoagulant therapy   Headache   Stroke  Discharge Medications:   Medication List    TAKE these medications        acetaminophen 325 MG tablet  Commonly known as:  TYLENOL  Take 2 tablets (650 mg total) by mouth every 6 (six) hours as needed for moderate pain, fever or headache.     aspirin EC 81 MG tablet  Take 81 mg by mouth daily.     atorvastatin 40 MG tablet  Commonly known as:  LIPITOR  Take 2 tablets (80 mg total) by mouth daily.     ibuprofen 600 MG tablet  Commonly known as:  ADVIL,MOTRIN  Take 1 tablet (600 mg total) by mouth every 6 (six) hours as needed for headache.     nitroGLYCERIN 0.4 MG SL tablet  Commonly known as:  NITROSTAT  Place 1 tablet (0.4 mg total) under the tongue every 5 (five) minutes as needed for chest pain (up to 3 doses).     warfarin 5 MG tablet  Commonly known as:  COUMADIN  Take 1.5 tablets (7.5 mg total) by mouth daily.       Asked to take 10mg  today and tomorrow coumadin. Then follow up at clinic for INR on firday.  Disposition and follow-up:   Ms.Lucette W Dyment was discharged from [facility] in Stable condition.  At the hospital follow up visit please address:  1. Dr. Jens Som: please assess if she still needs to be on coumadin. ECHO read pending to evaluate for persistent apical thrombus.    Came in with possible TIA with left sided numbness/weakness. They all resolved except still had some headache before discharge. Workup all negative, only showing previous infarct.  Increased lipitor to 80mg  daily. Needs to be on beta  blocker for her CAD. Didn't start one because BP low-normal here. Consider starting beta blocker outpatient. Was on coreg in the past then switched to metoprolol for palpitations. Patient is not sure when or why it was stopped.  Asked her to take tylenol or ibuprofen for her headache. See how she is doing and whether she needs any other treatment for her headache.   Coumadin: INR 0.99 today. Will give her 10mg  coumadin dose today before discharge. Then will do 10mg  tomorrow. Asked her to go to our clinic for INR check on Friday. Her usual dose is 7.5mg  daily.   Did ECHO to see if she still has the apical mural thrombus. Result pending. If she doesn't have the thrombus anymore, coumadin can likely be stopped as it was started mainly because of this. Although she does have antithrombin III hx ( this is debatable because Dr. Nadine Counts though she might not have it).   2.  Labs / imaging needed at time of follow-up: INR.  3.  Pending labs/ test needing follow-up: ECHO  Follow-up Appointments: Follow-up Information    Follow up with [name], MD On 02/28/2014.   Specialty:  Internal Medicine   Why:  @2 :15 pm    Contact information:   [address] [place] [address] [phone]  Follow up with Olga Millers, MD.   Specialty:  Cardiology   Contact information:   [address] [place] [address] [phone]       Follow up with SETHI,PRAMOD, MD In 1 month.   Specialties:  Neurology, Radiology   Why:  Stroke Clinic, Office will call you with appointment date & time   Contact information:   [address] [place] [address] [phone]       Follow up with [name], MD.   Specialty:  Internal Medicine   Contact information:   [address] [place] [address] [phone]       Discharge Instructions: Discharge Instructions    Ambulatory referral to Neurology    Complete by:  As directed   Stroke patient. Dr. Pearlean Brownie  prefers follow up in 1 month           Consultations:    Procedures Performed:  Ct Angio Head W/cm &/or Wo Cm  02/19/2014   CLINICAL DATA:  Initial valuation for acute left-sided headache, numbness, vision change. Gait impairment.  EXAM: CT ANGIOGRAPHY HEAD AND NECK  TECHNIQUE: Multidetector CT imaging of the head and neck was performed using the standard protocol during bolus administration of intravenous contrast. Multiplanar CT image reconstructions and MIPs were obtained to evaluate the vascular anatomy. Carotid stenosis measurements (when applicable) are obtained utilizing NASCET criteria, using the distal internal carotid diameter as the denominator.  CONTRAST:  50mL OMNIPAQUE IOHEXOL 350 MG/ML SOLN  COMPARISON:  Prior noncontrast head CT performed on the same day.  FINDINGS: CTA HEAD FINDINGS  ANTERIOR CIRCULATION:  The petrous, cavernous, and supra clinoid segments of the internal carotid arteries are widely patent bilaterally. A1 segments symmetric in well opacified. Anterior communicating artery within normal limits. Anterior cerebral arteries well opacified.  M1 segments widely patent bilaterally without significant stenosis or proximal branch occlusion. Distal MCA branches well opacified.  POSTERIOR CIRCULATION:  Vertebral arteries are codominant. Posterior inferior cerebellar arteries opacified bilaterally. Vertebrobasilar junction and basilar artery are widely patent. Superior cerebellar arteries are patent. The right P1 and P2 segments are well opacified without significant stenosis or proximal branch occlusion. Left P1 segment well opacified. Flow within the distal left posterior cerebral artery mildly attenuated, likely related to remote left occipital infarct.  No aneurysm or vascular malformation. No abnormal enhancement on delayed sequence.  CTA NECK FINDINGS  The visualized aortic arch is of normal caliber with normal 3 vessel morphology. No high-grade stenosis seen at the origin of  the great vessels. Subclavian arteries widely patent.  Common carotid artery is well opacified and are symmetric in caliber. Carotid bifurcations within normal limits. Internal carotid arteries are well opacified to the skullbase without hemodynamically significant stenosis, dissection, or occlusion.  External carotid arteries and their branches are within normal limits.  Both vertebral arteries arise from the subclavian arteries. Vertebral arteries are well opacified along their entire course without evidence of dissection or significant stenosis.  No soft tissue abnormality identified within the neck. No adenopathy. Thyroid gland is normal. Visualized superior mediastinum within normal limits. Visualized lungs are clear.  No acute osseous abnormality. Reversal of the normal cervical lordosis likely related to patient positioning. No worrisome lytic or blastic osseous lesions.  IMPRESSION: 1. Unremarkable CTA of the neck with no evidence for hemodynamically significant stenosis, dissection, or other abnormality. 2. No proximal branch occlusion or hemodynamically significant stenosis within the intracranial circulation. There is mildly attenuated flow within the distal left PCA,  likely related to remote left occipital infarct.   Electronically Signed   By: Rise Mu M.D.   On: 02/19/2014 22:00   Ct Head (brain) Wo Contrast  02/19/2014   CLINICAL DATA:  Code stroke.  Left-sided numbness.  EXAM: CT HEAD WITHOUT CONTRAST  TECHNIQUE: Contiguous axial images were obtained from the base of the skull through the vertex without intravenous contrast.  COMPARISON:  10/30/2013  FINDINGS: Ventricles and sulci are appropriate for patient's age. Old left occipital lobe infarct, stable. No evidence for acute cortically based infarct, intracranial hemorrhage, mass lesion or mass effect. The mastoid air cells are well aerated. Paranasal sinuses are unremarkable. Calvarium is intact.  IMPRESSION: Remote left occipital  infarct.  No acute intracranial process.  Critical Value/emergent results were called by telephone at the time of interpretation on 02/19/2014 at 8:01 pm to Dr. Thad Ranger, who verbally acknowledged these results.   Electronically Signed   By: Annia Belt M.D.   On: 02/19/2014 20:01   Ct Angio Neck W/cm &/or Wo/cm  02/19/2014   CLINICAL DATA:  Initial valuation for acute left-sided headache, numbness, vision change. Gait impairment.  EXAM: CT ANGIOGRAPHY HEAD AND NECK  TECHNIQUE: Multidetector CT imaging of the head and neck was performed using the standard protocol during bolus administration of intravenous contrast. Multiplanar CT image reconstructions and MIPs were obtained to evaluate the vascular anatomy. Carotid stenosis measurements (when applicable) are obtained utilizing NASCET criteria, using the distal internal carotid diameter as the denominator.  CONTRAST:  50mL OMNIPAQUE IOHEXOL 350 MG/ML SOLN  COMPARISON:  Prior noncontrast head CT performed on the same day.  FINDINGS: CTA HEAD FINDINGS  ANTERIOR CIRCULATION:  The petrous, cavernous, and supra clinoid segments of the internal carotid arteries are widely patent bilaterally. A1 segments symmetric in well opacified. Anterior communicating artery within normal limits. Anterior cerebral arteries well opacified.  M1 segments widely patent bilaterally without significant stenosis or proximal branch occlusion. Distal MCA branches well opacified.  POSTERIOR CIRCULATION:  Vertebral arteries are codominant. Posterior inferior cerebellar arteries opacified bilaterally. Vertebrobasilar junction and basilar artery are widely patent. Superior cerebellar arteries are patent. The right P1 and P2 segments are well opacified without significant stenosis or proximal branch occlusion. Left P1 segment well opacified. Flow within the distal left posterior cerebral artery mildly attenuated, likely related to remote left occipital infarct.  No aneurysm or vascular  malformation. No abnormal enhancement on delayed sequence.  CTA NECK FINDINGS  The visualized aortic arch is of normal caliber with normal 3 vessel morphology. No high-grade stenosis seen at the origin of the great vessels. Subclavian arteries widely patent.  Common carotid artery is well opacified and are symmetric in caliber. Carotid bifurcations within normal limits. Internal carotid arteries are well opacified to the skullbase without hemodynamically significant stenosis, dissection, or occlusion.  External carotid arteries and their branches are within normal limits.  Both vertebral arteries arise from the subclavian arteries. Vertebral arteries are well opacified along their entire course without evidence of dissection or significant stenosis.  No soft tissue abnormality identified within the neck. No adenopathy. Thyroid gland is normal. Visualized superior mediastinum within normal limits. Visualized lungs are clear.  No acute osseous abnormality. Reversal of the normal cervical lordosis likely related to patient positioning. No worrisome lytic or blastic osseous lesions.  IMPRESSION: 1. Unremarkable CTA of the neck with no evidence for hemodynamically significant stenosis, dissection, or other abnormality. 2. No proximal branch occlusion or hemodynamically significant stenosis within the intracranial circulation. There is mildly  attenuated flow within the distal left PCA, likely related to remote left occipital infarct.   Electronically Signed   By: Rise MuBenjamin  McClintock M.D.   On: 02/19/2014 22:00   Mr Brain Wo Contrast  02/21/2014   CLINICAL DATA:  LEFT-sided numbness beginning yesterday, blurry vision, slurred speech, headache. Majority of symptoms now resolved. History of LEFT posterior cerebral artery territory infarct. Off Coumadin for 6 days, anticoagulants for apical mural thrombus identified May 2015.  EXAM: MRI HEAD WITHOUT CONTRAST  TECHNIQUE: Multiplanar, multiecho pulse sequences of the brain  and surrounding structures were obtained without intravenous contrast.  COMPARISON:  CT angiogram of the head and neck February 19, 2014 and MRI of the head June 29, 2011  FINDINGS: No reduced diffusion to suggest acute ischemia. Minimal susceptibility artifact within the LEFT mesial occipital lobe corresponding to cystic encephalomalacia present on prior imaging. No midline shift or mass effect. Ventricles and sulci are overall normal for patient's age. No suspicious parenchymal signal. No abnormal extra-axial fluid collections. Normal major intracranial vascular flow voids seen at the skull base.  Ocular globes and orbital contents are unremarkable. No paranasal sinus air-fluid levels. Trace fluid signal in the mastoid air cells. No abnormal sellar expansion. No cerebellar tonsillar ectopia. No suspicious calvarial bone marrow signal.  IMPRESSION: No acute intracranial process, specifically no evidence of acute ischemia.  Remote small LEFT posterior cerebral artery territory infarct.   Electronically Signed   By: Awilda Metroourtnay  Bloomer   On: 02/21/2014 00:43   Admission HPI:   Ms. Cliffton AstersWhite is a 43yo woman w/ PMHx of prior CVA in 2010, CAD (apical LAD infarction '00; NSTEMI s/p BMS to prox LAD '09), HTN, and tobacco abuse who presented to the ED as a code stroke. Patient states she was at home when around 4 PM she suddenly had left-sided pressure and squeezing in her head. She states she then felt numbness in her left arm. She noted some blurred vision as well. She states around 7 PM she had a headache all over and that her mother told her she had slurred speech so she decided to call EMS. Per EMS notes, patient had gait abnormality. Patient denies any weakness. She states her numbness and blurred vision have improved, but that her headache is still bothering her. She notes she stopped taking her coumadin 6 days ago because she was scheduled to have a tooth extraction tomorrow. She had cleared this with her  cardiologist. She denies any history of headaches or migraines. She notes she has not been taking her Lipitor.   In the ED, an emergent CT Head was performed which showed evidence of a remote left occipital infarct, but no acute intracranial abnormalities. CTA Head/Neck was also done which was negative for acute abnormalities.   Hospital Course by problem list: Principal Problem:   TIA (transient ischemic attack) Active Problems:   Coronary Artery Disease   Antithrombin III deficiency   Long term current use of anticoagulant therapy   Headache   Stroke   43 yo femle with hx of CVA, CAD, HTN, antithrombin III deficiency, and Apical mural thrombus seen on May 2015, here with TIA. Patient has been off coumadin for 6 days for scheduled dental extraction.  43 yo femle with hx of CVA, CAD, HTN, antithrombin III deficiency, and Apical mural thrombus seen on May 2015, here with TIA. Patient has been off coumadin for 6 days for scheduled dental extraction.   TIA - patient had left sided headache, Left sided weakness, numbness,  blurred vision, slurred speech. CT head showed old remote left occipital infarct. CTA head/neck negative for acute changes. The symptoms all resolved suggesting TIA. - of note, patient had been off couamdin for ~ 6 days for dental surgery scheduled for today. This is unlikely the precipitant since CT doesn't show any new embolic stroke. MRI brain shows remote small left posterior cerebral artery territory infarct. TIA was likely due for some other cause, such as vessel stenosis or hypotension.  - stroke team on board. They recommended TIA workup and risk modification.  - hgba1c 5.0 (last 5.2 on may 2015) so not diabetic. Lipid panel shows LDL 80.  - will increase lipitor from 40 to 80 daily for better LDL lowering (<70 goal). Cont asa. - encourage smoking cessation - continue coumadin. Will not stop coumadin without briding in the future, if he has procedures, he will likely need  to bridge with lovenox when off coumadin. - Dr. Jens Som can decided at follow up whether coumadin is still required. ECHO was done to see if she still has the mural thrombus. Coumadin can likely be stopped if does not have the thrombus.   Apical mural thrombus - seen on echo 08/02/2013.  - continue coumadin which was held for dental extraction. In the future, will need to have lovenox bridging during procedures for which coumadin needs to be held. Coumadin may be taken off if she doesn't have the apical thrombus anymore, that's why we did the ECHO, read pending. Will not keep patient here waiting for read. Can be followed up outpatient with Dr. Jens Som.  HA - likely tension headache as she does have some tensed muscle on the neck, unlikely migraine as no hx of migraine but possible - better with tylenol and also giving her ibuprofen PRN for headache. Improving.  Diarrhea - had episodes of loose stools. C. Diff negative. Likely viral. Encouraged PO hydration.  - diarrhea improving.  CAD - apical LAD infarct 2000, NSTEMi s/p BMS prox LAD 2009.  - is on 81mg  daily asa daily. Cont this. - increased lipitor from 40 to 80mg  daily. Patient has not been taking lipitor regularly.  - currently not on beta blocker but needs to be for her CAD. See below.  HTN - had been on coreg in the past but patient wasn't taking it. Dr. Jens Som switched her to Toprol XL 25 qdaily but patient wasn't taking it either. She was having palpitations, that was likely why toprol was chosen.  - BP currently under control so will not start it currently. Consider starting toprol or coreg as outpatient follow up for her CAD.  Antithrombin III Deficiency - unclear history. No official record showing the diagnosis. Per Dr. Myna Hidalgo hem-onc previously, this may not be accurate diagnosis. She may have protein C deficiency.  - regardless, she needs to be on coumadin for her apical mural thrombus seen on May 2015 - asked her to try to  get the records from her physician in [place], [place] where she was diagnosed with this. She will try to find their information at home.   Tobacco abuse - smokes 1/4 ppd. Also hx of cocaine abuse UDS negative this time but was + 3 months ago  - encourage smoking and cocaine cessation. Discharge Vitals:   BP 135/84 mmHg  Pulse 45  Temp(Src) 98.4 F (36.9 C) (Oral)  Resp 19  Ht 5\' 6"  (1.676 m)  Wt 164 lb (74.39 kg)  BMI 26.48 kg/m2  SpO2 100%  LMP 02/19/2014 (Exact  Date)  Discharge Labs:  Results for orders placed or performed during the hospital encounter of 02/19/14 (from the past 24 hour(s))  Protime-INR     Status: None   Collection Time: 02/22/14  3:55 AM  Result Value Ref Range   Prothrombin Time 13.2 11.6 - 15.2 seconds   INR 0.99 0.00 - 1.49    Signed: Hyacinth Meeker, MD 02/22/2014, 12:55 PM    Services Ordered on Discharge:  Equipment Ordered on Discharge:

## 2014-02-21 NOTE — Progress Notes (Addendum)
Subjective:  She feels fine today except for her headache. Headache overall improved to 5/10, pressure is less today. Tylenol helps take the edge off but doesn't completely treat it. Having some loose stool 3x yesterday and 2x today. She is also on her menses currently. All other symptoms resolved. Denies any sob, cp, cough, n/v, fever, chills, headache, numbness, weakness, tingling.   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 It was diagnosed in [place], [place]. She cannot remember the doctor or the practice name right now but will work on finding their info and either try to get the records from them or get them to fax it to our clinic. She was initially on lovenox then started on coumadin for this. Was taking coumadin expecting to be on it lifetime but it was discontinued recently few years ago as it was thought that she might not have the antithrombin III deficiency. Coumadin was started again after the apical mural thrombus was seen.   Objective: Vital signs in last 24 hours: Filed Vitals:   02/20/14 1300 02/20/14 1821 02/21/14 0136 02/21/14 0513  BP: 135/83 130/83 122/72 128/96  Pulse: 80 79 80 72  Temp: 98.8 F (37.1 C) 98.4 F (36.9 C) 98.2 F (36.8 C) 98.4 F (36.9 C)  TempSrc: Oral Oral Oral Oral  Resp: 18 18 20 20   Height:      Weight:      SpO2: 100% 100% 100% 100%   Weight change:  No intake or output data in the 24 hours ending 02/21/14 0724 Vitals reviewed. General: resting in bed, NAD HEENT: PERRL, EOMI, no scleral icterus Cardiac: RRR, no rubs, murmurs or gallops Pulm: clear to auscultation bilaterally, no wheezes, rales, or rhonchi Abd: soft, nontender, nondistended, BS present Ext: warm and well perfused, no pedal edema. 5/5 strength on all extremities.  Neuro: alert and oriented X3, cranial nerves II-XII grossly intact  Lab Results: Basic Metabolic Panel:  Recent Labs Lab 02/19/14 1944  NA 138  K 3.8  CL 100  CO2 21  GLUCOSE  93  BUN 7  CREATININE 0.62  CALCIUM 9.0   Liver Function Tests:  Recent Labs Lab 02/19/14 1944  AST 56*  ALT 21  ALKPHOS 114  BILITOT 0.5  PROT 7.5  ALBUMIN 3.7   No results for input(s): LIPASE, AMYLASE in the last 168 hours. No results for input(s): AMMONIA in the last 168 hours. CBC:  Recent Labs Lab 02/19/14 1944  WBC 9.0  NEUTROABS 7.6  HGB 11.8*  HCT 34.9*  MCV 94.1  PLT 217   Cardiac Enzymes: No results for input(s): CKTOTAL, CKMB, CKMBINDEX, TROPONINI in the last 168 hours. BNP: No results for input(s): PROBNP in the last 168 hours. D-Dimer: No results for input(s): DDIMER in the last 168 hours. CBG:  Recent Labs Lab 02/19/14 2003  GLUCAP 89   Hemoglobin A1C:  Recent Labs Lab 02/20/14 0624  HGBA1C 5.0   Fasting Lipid Panel:  Recent Labs Lab 02/20/14 0624  CHOL 200  HDL 82  LDLCALC 80  TRIG 189*  CHOLHDL 2.4   Thyroid Function Tests: No results for input(s): TSH, T4TOTAL, FREET4, T3FREE, THYROIDAB in the last 168 hours. Coagulation:  Recent Labs Lab 02/19/14 1944 02/21/14 0458  LABPROT 13.3 13.2  INR 1.00 0.99   Anemia Panel: No results for input(s): VITAMINB12, FOLATE, FERRITIN, TIBC, IRON, RETICCTPCT in the last 168 hours. Urine Drug Screen: Drugs of Abuse     Component Value Date/Time   LABOPIA NONE  DETECTED 02/19/2014 2200   COCAINSCRNUR NONE DETECTED 02/19/2014 2200   LABBENZ NONE DETECTED 02/19/2014 2200   AMPHETMU NONE DETECTED 02/19/2014 2200   THCU NONE DETECTED 02/19/2014 2200   LABBARB NONE DETECTED 02/19/2014 2200    Alcohol Level: No results for input(s): ETH in the last 168 hours. Urinalysis: No results for input(s): COLORURINE, LABSPEC, PHURINE, GLUCOSEU, HGBUR, BILIRUBINUR, KETONESUR, PROTEINUR, UROBILINOGEN, NITRITE, LEUKOCYTESUR in the last 168 hours.  Invalid input(s): APPERANCEUR Misc. Labs:  Micro Results: No results found for this or any previous visit (from the past 240  hour(s)). Studies/Results: Ct Angio Head W/cm &/or Wo Cm  02/19/2014   CLINICAL DATA:  Initial valuation for acute left-sided headache, numbness, vision change. Gait impairment.  EXAM: CT ANGIOGRAPHY HEAD AND NECK  TECHNIQUE: Multidetector CT imaging of the head and neck was performed using the standard protocol during bolus administration of intravenous contrast. Multiplanar CT image reconstructions and MIPs were obtained to evaluate the vascular anatomy. Carotid stenosis measurements (when applicable) are obtained utilizing NASCET criteria, using the distal internal carotid diameter as the denominator.  CONTRAST:  50mL OMNIPAQUE IOHEXOL 350 MG/ML SOLN  COMPARISON:  Prior noncontrast head CT performed on the same day.  FINDINGS: CTA HEAD FINDINGS  ANTERIOR CIRCULATION:  The petrous, cavernous, and supra clinoid segments of the internal carotid arteries are widely patent bilaterally. A1 segments symmetric in well opacified. Anterior communicating artery within normal limits. Anterior cerebral arteries well opacified.  M1 segments widely patent bilaterally without significant stenosis or proximal branch occlusion. Distal MCA branches well opacified.  POSTERIOR CIRCULATION:  Vertebral arteries are codominant. Posterior inferior cerebellar arteries opacified bilaterally. Vertebrobasilar junction and basilar artery are widely patent. Superior cerebellar arteries are patent. The right P1 and P2 segments are well opacified without significant stenosis or proximal branch occlusion. Left P1 segment well opacified. Flow within the distal left posterior cerebral artery mildly attenuated, likely related to remote left occipital infarct.  No aneurysm or vascular malformation. No abnormal enhancement on delayed sequence.  CTA NECK FINDINGS  The visualized aortic arch is of normal caliber with normal 3 vessel morphology. No high-grade stenosis seen at the origin of the great vessels. Subclavian arteries widely patent.  Common  carotid artery is well opacified and are symmetric in caliber. Carotid bifurcations within normal limits. Internal carotid arteries are well opacified to the skullbase without hemodynamically significant stenosis, dissection, or occlusion.  External carotid arteries and their branches are within normal limits.  Both vertebral arteries arise from the subclavian arteries. Vertebral arteries are well opacified along their entire course without evidence of dissection or significant stenosis.  No soft tissue abnormality identified within the neck. No adenopathy. Thyroid gland is normal. Visualized superior mediastinum within normal limits. Visualized lungs are clear.  No acute osseous abnormality. Reversal of the normal cervical lordosis likely related to patient positioning. No worrisome lytic or blastic osseous lesions.  IMPRESSION: 1. Unremarkable CTA of the neck with no evidence for hemodynamically significant stenosis, dissection, or other abnormality. 2. No proximal branch occlusion or hemodynamically significant stenosis within the intracranial circulation. There is mildly attenuated flow within the distal left PCA, likely related to remote left occipital infarct.   Electronically Signed   By: Rise MuBenjamin  McClintock M.D.   On: 02/19/2014 22:00   Ct Head (brain) Wo Contrast  02/19/2014   CLINICAL DATA:  Code stroke.  Left-sided numbness.  EXAM: CT HEAD WITHOUT CONTRAST  TECHNIQUE: Contiguous axial images were obtained from the base of the skull through the vertex  without intravenous contrast.  COMPARISON:  10/30/2013  FINDINGS: Ventricles and sulci are appropriate for patient's age. Old left occipital lobe infarct, stable. No evidence for acute cortically based infarct, intracranial hemorrhage, mass lesion or mass effect. The mastoid air cells are well aerated. Paranasal sinuses are unremarkable. Calvarium is intact.  IMPRESSION: Remote left occipital infarct.  No acute intracranial process.  Critical  Value/emergent results were called by telephone at the time of interpretation on 02/19/2014 at 8:01 pm to Dr. Thad Ranger, who verbally acknowledged these results.   Electronically Signed   By: Annia Belt M.D.   On: 02/19/2014 20:01   Ct Angio Neck W/cm &/or Wo/cm  02/19/2014   CLINICAL DATA:  Initial valuation for acute left-sided headache, numbness, vision change. Gait impairment.  EXAM: CT ANGIOGRAPHY HEAD AND NECK  TECHNIQUE: Multidetector CT imaging of the head and neck was performed using the standard protocol during bolus administration of intravenous contrast. Multiplanar CT image reconstructions and MIPs were obtained to evaluate the vascular anatomy. Carotid stenosis measurements (when applicable) are obtained utilizing NASCET criteria, using the distal internal carotid diameter as the denominator.  CONTRAST:  50mL OMNIPAQUE IOHEXOL 350 MG/ML SOLN  COMPARISON:  Prior noncontrast head CT performed on the same day.  FINDINGS: CTA HEAD FINDINGS  ANTERIOR CIRCULATION:  The petrous, cavernous, and supra clinoid segments of the internal carotid arteries are widely patent bilaterally. A1 segments symmetric in well opacified. Anterior communicating artery within normal limits. Anterior cerebral arteries well opacified.  M1 segments widely patent bilaterally without significant stenosis or proximal branch occlusion. Distal MCA branches well opacified.  POSTERIOR CIRCULATION:  Vertebral arteries are codominant. Posterior inferior cerebellar arteries opacified bilaterally. Vertebrobasilar junction and basilar artery are widely patent. Superior cerebellar arteries are patent. The right P1 and P2 segments are well opacified without significant stenosis or proximal branch occlusion. Left P1 segment well opacified. Flow within the distal left posterior cerebral artery mildly attenuated, likely related to remote left occipital infarct.  No aneurysm or vascular malformation. No abnormal enhancement on delayed sequence.   CTA NECK FINDINGS  The visualized aortic arch is of normal caliber with normal 3 vessel morphology. No high-grade stenosis seen at the origin of the great vessels. Subclavian arteries widely patent.  Common carotid artery is well opacified and are symmetric in caliber. Carotid bifurcations within normal limits. Internal carotid arteries are well opacified to the skullbase without hemodynamically significant stenosis, dissection, or occlusion.  External carotid arteries and their branches are within normal limits.  Both vertebral arteries arise from the subclavian arteries. Vertebral arteries are well opacified along their entire course without evidence of dissection or significant stenosis.  No soft tissue abnormality identified within the neck. No adenopathy. Thyroid gland is normal. Visualized superior mediastinum within normal limits. Visualized lungs are clear.  No acute osseous abnormality. Reversal of the normal cervical lordosis likely related to patient positioning. No worrisome lytic or blastic osseous lesions.  IMPRESSION: 1. Unremarkable CTA of the neck with no evidence for hemodynamically significant stenosis, dissection, or other abnormality. 2. No proximal branch occlusion or hemodynamically significant stenosis within the intracranial circulation. There is mildly attenuated flow within the distal left PCA, likely related to remote left occipital infarct.   Electronically Signed   By: Rise Mu M.D.   On: 02/19/2014 22:00   Mr Brain Wo Contrast  02/21/2014   CLINICAL DATA:  LEFT-sided numbness beginning yesterday, blurry vision, slurred speech, headache. Majority of symptoms now resolved. History of LEFT posterior cerebral artery territory  infarct. Off Coumadin for 6 days, anticoagulants for apical mural thrombus identified May 2015.  EXAM: MRI HEAD WITHOUT CONTRAST  TECHNIQUE: Multiplanar, multiecho pulse sequences of the brain and surrounding structures were obtained without intravenous  contrast.  COMPARISON:  CT angiogram of the head and neck February 19, 2014 and MRI of the head June 29, 2011  FINDINGS: No reduced diffusion to suggest acute ischemia. Minimal susceptibility artifact within the LEFT mesial occipital lobe corresponding to cystic encephalomalacia present on prior imaging. No midline shift or mass effect. Ventricles and sulci are overall normal for patient's age. No suspicious parenchymal signal. No abnormal extra-axial fluid collections. Normal major intracranial vascular flow voids seen at the skull base.  Ocular globes and orbital contents are unremarkable. No paranasal sinus air-fluid levels. Trace fluid signal in the mastoid air cells. No abnormal sellar expansion. No cerebellar tonsillar ectopia. No suspicious calvarial bone marrow signal.  IMPRESSION: No acute intracranial process, specifically no evidence of acute ischemia.  Remote small LEFT posterior cerebral artery territory infarct.   Electronically Signed   By: Awilda Metro   On: 02/21/2014 00:43   Medications: I have reviewed the patient's current medications. Scheduled Meds: .  stroke: mapping our early stages of recovery book   Does not apply Once  . aspirin EC  81 mg Oral Daily  . atorvastatin  80 mg Oral Daily  . Influenza vac split quadrivalent PF  0.5 mL Intramuscular Tomorrow-1000  . labetalol  10 mg Intravenous Once  . pneumococcal 23 valent vaccine  0.5 mL Intramuscular Tomorrow-1000  . Warfarin - Pharmacist Dosing Inpatient   Does not apply q1800   Continuous Infusions:  PRN Meds:.acetaminophen Assessment/Plan: Principal Problem:   TIA (transient ischemic attack) Active Problems:   Coronary Artery Disease   Antithrombin III deficiency   Long term current use of anticoagulant therapy   Headache   Stroke  43 yo femle with hx of CVA, CAD, HTN, antithrombin III deficiency, and Apical mural thrombus seen on May 2015, here with TIA. Patient has been off coumadin for 6 days for scheduled  dental extraction.   TIA - patient had left sided headache, Left sided weakness, numbness, blurred vision, slurred speech. CT head showed old remote left occipital infarct. CTA head/neck negative for acute changes. The symptoms all resolved suggesting TIA. - of note, patient had been off couamdin for ~ 6 days for dental surgery scheduled for today. This is unlikely the precipitant since CT doesn't show any new embolic stroke. MRI brain shows remote small left posterior cerebral artery territory infarct. TIA was likely due for some other cause, such as vessel stenosis or hypotension.  - stroke team on board. They recommended TIA workup and risk modification.  - hgba1c 5.0 (last 5.2 on may 2015) so not diabetic. Lipid panel shows LDL 80.  - will increase lipitor from 40 to 80 daily for better LDL lowering (<70 goal). Cont asa. - encourage smoking cessation - continue coumadin. Will not stop coumadin without briding in the future, if he has procedures, he will likely need to bridge with lovenox when off coumadin.  HA - likely tension headache as she does have some tensed muscle on the neck, unlikely migraine as no hx of migraine but possible - better with tylenol and also giving her ibuprofen PRN for headache. Improving.  Diarrhea - had episodes of loose stools yesterday 3x and today 2x. C. Diff negative. Likely viral. Encouraged PO hydration.   CAD - apical LAD infarct 2000, NSTEMi  s/p BMS prox LAD 2009.  - is on 81mg  daily asa daily. Cont this. - increased lipitor from 40 to 80mg  daily. Patient has not been taking lipitor regularly.  - currently not on beta blocker but needs to be for her CAD. See below.  HTN - had been on coreg in the past but patient wasn't taking it. Dr. Jens Som switched her to Toprol XL 25 qdaily but patient wasn't taking it either. She was having palpitations, that was likely why toprol was chosen.  - BP currently under control so will not start it currently. Consider  starting toprol or coreg as outpatient follow up for her CAD.  Antithrombin III Deficiency - unclear history. No official record showing the diagnosis. Per Dr. Myna Hidalgo hem-onc previously, this may not be accurate diagnosis. She may have protein C deficiency.  - regardless, she needs to be on coumadin for her apical mural thrombus seen on May 2015 - asked her to try to get the records from her physician in [place], [place] where she was diagnosed with this. She will try to find their information at home.  Apical mural thrombus - seen on echo 08/02/2013.  - continue coumadin which was held for dental extraction. In the future, will need to have lovenox bridging during procedures for which coumadin needs to be held.  Tobacco abuse - smokes 1/4 ppd. Also hx of cocaine abuse UDS negative this time but was + 3 months ago  - encourage smoking and cocaine cessation.  Dispo: Disposition is deferred at this time, awaiting improvement of current medical problems.  Anticipated discharge in approximately 1-2 day(s).   The patient does have a current PCP (Ejiroghene Wendall Stade, MD) and does need an [facility] hospital follow-up appointment after discharge.  The patient does have transportation limitations that hinder transportation to clinic appointments.  .Services Needed at time of discharge: Y = Yes, Blank = No PT:   OT:   RN:   Equipment:   Other:     LOS: 2 days   Hyacinth Meeker, MD 02/21/2014, 7:24 AM

## 2014-02-22 ENCOUNTER — Encounter: Payer: Self-pay | Admitting: Internal Medicine

## 2014-02-22 DIAGNOSIS — G459 Transient cerebral ischemic attack, unspecified: Secondary | ICD-10-CM

## 2014-02-22 LAB — PROTIME-INR
INR: 0.99 (ref 0.00–1.49)
PROTHROMBIN TIME: 13.2 s (ref 11.6–15.2)

## 2014-02-22 MED ORDER — PERFLUTREN LIPID MICROSPHERE
INTRAVENOUS | Status: AC
  Filled 2014-02-22: qty 10

## 2014-02-22 MED ORDER — PERFLUTREN LIPID MICROSPHERE: 1.0000 mL | INTRAVENOUS | Status: DC | PRN

## 2014-02-22 MED ORDER — WARFARIN SODIUM 5 MG PO TABS
10.0000 mg | ORAL_TABLET | Freq: Once | ORAL | Status: DC
  Filled 2014-02-22: qty 2

## 2014-02-22 MED ORDER — PERFLUTREN LIPID MICROSPHERE
2.0000 mL | INTRAVENOUS | Status: AC | PRN
  Administered 2014-02-22: 2 mL via INTRAVENOUS
  Filled 2014-02-22: qty 10

## 2014-02-22 NOTE — Progress Notes (Signed)
Patient is discharged from room 4N26 at this time. Alert and in stable condition. IV site d/c'd as well as tele. Instructions read to patient and understanding verbalized. Left unit via wheelchair with all belongings and husband at side.

## 2014-02-22 NOTE — Plan of Care (Signed)
Problem: Acute Treatment Outcomes Goal: Hemodynamically stable Outcome: Completed/Met Date Met:  02/22/14 Goal: Other Acute Treatment Outcomes Outcome: Not Applicable Date Met:  42/68/34  Problem: Progression Outcomes Goal: Other Progression Outcomes Outcome: Completed/Met Date Met:  02/22/14  Problem: Discharge/Transitional Outcomes Goal: INR monitor plan established Outcome: Progressing

## 2014-02-22 NOTE — Progress Notes (Signed)
ANTICOAGULATION CONSULT NOTE - FOLLOW UP  Pharmacy Consult:  Coumadin Indication:  History of apical mural thrombus  No Known Allergies  Patient Measurements: Height: 5\' 6"  (167.6 cm) Weight: 164 lb (74.39 kg) IBW/kg (Calculated) : 59.3  Vital Signs: Temp: 98.4 F (36.9 C) (12/09 0646) Temp Source: Oral (12/09 0646) BP: 142/99 mmHg (12/09 0646) Pulse Rate: 80 (12/09 0646)  Labs:  Recent Labs  02/19/14 1944 02/21/14 0458 02/22/14 0355  HGB 11.8*  --   --   HCT 34.9*  --   --   PLT 217  --   --   APTT 26  --   --   LABPROT 13.3 13.2 13.2  INR 1.00 0.99 0.99  CREATININE 0.62  --   --     Estimated Creatinine Clearance: 91.5 mL/min (by C-G formula based on Cr of 0.62).    Assessment: 43yof on coumadin pta for history of apical mural thrombus.  Her Coumadin had been on hold for ~6 days for a planned tooth extraction.  Patient presented with stroke-like symptom but ruled out.  Pharmacy was consulted to resume coumadin on 12/7. Lovenox bridge added 12/8. INR remains subtherapeutic at 0.99.  Home dose:  7.5mg  daily  Goal of Therapy:  INR 2-3  Plan:  1) Repeat coumadin 10mg  x 1 2) Continue lovenox 110mg  sq q24 (~1.5mg /kg q24) 3) INR in AM 4) Follow up repeat ECHO - per Dr. Pearlean Brownie may be able to d/c coumadin if thrombus completely resolved.  Louie Casa, PharmD, BCPS 02/22/2014, 8:35 AM

## 2014-02-22 NOTE — Progress Notes (Signed)
Subjective:  Doing well. Headache under control with tylenol. Denies any sob, cp, cough, n/v, fever, chills, headache, numbness, weakness, tingling.  Echo was done but read pending. Will have her follow up with Dr. Jens Somrenshaw with echo results about coumadin.   Objective: Vital signs in last 24 hours: Filed Vitals:   02/21/14 2300 02/22/14 0300 02/22/14 0646 02/22/14 1121  BP:  133/89 142/99 135/84  Pulse: 70 76 80 45  Temp:  98.4 F (36.9 C) 98.4 F (36.9 C) 98.4 F (36.9 C)  TempSrc:  Oral Oral Oral  Resp: 18 18 20 19   Height:      Weight:      SpO2: 100% 100% 99% 100%   Weight change:   Intake/Output Summary (Last 24 hours) at 02/22/14 1250 Last data filed at 02/22/14 0100  Gross per 24 hour  Intake    240 ml  Output      0 ml  Net    240 ml   Vitals reviewed. General: resting in bed, NAD HEENT: PERRL, EOMI, no scleral icterus Cardiac: RRR, no rubs, murmurs or gallops Pulm: clear to auscultation bilaterally, no wheezes, rales, or rhonchi Abd: soft, nontender, nondistended, BS present Ext: warm and well perfused, no pedal edema. 5/5 strength on all extremities.  Neuro: alert and oriented X3, cranial nerves II-XII grossly intact  Lab Results: Basic Metabolic Panel:  Recent Labs Lab 02/19/14 1944  NA 138  K 3.8  CL 100  CO2 21  GLUCOSE 93  BUN 7  CREATININE 0.62  CALCIUM 9.0   Liver Function Tests:  Recent Labs Lab 02/19/14 1944  AST 56*  ALT 21  ALKPHOS 114  BILITOT 0.5  PROT 7.5  ALBUMIN 3.7   No results for input(s): LIPASE, AMYLASE in the last 168 hours. No results for input(s): AMMONIA in the last 168 hours. CBC:  Recent Labs Lab 02/19/14 1944  WBC 9.0  NEUTROABS 7.6  HGB 11.8*  HCT 34.9*  MCV 94.1  PLT 217   Cardiac Enzymes: No results for input(s): CKTOTAL, CKMB, CKMBINDEX, TROPONINI in the last 168 hours. BNP: No results for input(s): PROBNP in the last 168 hours. D-Dimer: No results for input(s): DDIMER in the last 168  hours. CBG:  Recent Labs Lab 02/19/14 2003  GLUCAP 89   Hemoglobin A1C:  Recent Labs Lab 02/20/14 0624  HGBA1C 5.0   Fasting Lipid Panel:  Recent Labs Lab 02/20/14 0624  CHOL 200  HDL 82  LDLCALC 80  TRIG 189*  CHOLHDL 2.4   Thyroid Function Tests: No results for input(s): TSH, T4TOTAL, FREET4, T3FREE, THYROIDAB in the last 168 hours. Coagulation:  Recent Labs Lab 02/19/14 1944 02/21/14 0458 02/22/14 0355  LABPROT 13.3 13.2 13.2  INR 1.00 0.99 0.99   Anemia Panel: No results for input(s): VITAMINB12, FOLATE, FERRITIN, TIBC, IRON, RETICCTPCT in the last 168 hours. Urine Drug Screen: Drugs of Abuse     Component Value Date/Time   LABOPIA NONE DETECTED 02/19/2014 2200   COCAINSCRNUR NONE DETECTED 02/19/2014 2200   LABBENZ NONE DETECTED 02/19/2014 2200   AMPHETMU NONE DETECTED 02/19/2014 2200   THCU NONE DETECTED 02/19/2014 2200   LABBARB NONE DETECTED 02/19/2014 2200    Alcohol Level: No results for input(s): ETH in the last 168 hours. Urinalysis: No results for input(s): COLORURINE, LABSPEC, PHURINE, GLUCOSEU, HGBUR, BILIRUBINUR, KETONESUR, PROTEINUR, UROBILINOGEN, NITRITE, LEUKOCYTESUR in the last 168 hours.  Invalid input(s): APPERANCEUR Misc. Labs:  Micro Results: Recent Results (from the past 240 hour(s))  Clostridium  Difficile by PCR     Status: None   Collection Time: 02/20/14  7:44 PM  Result Value Ref Range Status   C difficile by pcr NEGATIVE NEGATIVE Final   Studies/Results: Mr Brain Wo Contrast  02/21/2014   CLINICAL DATA:  LEFT-sided numbness beginning yesterday, blurry vision, slurred speech, headache. Majority of symptoms now resolved. History of LEFT posterior cerebral artery territory infarct. Off Coumadin for 6 days, anticoagulants for apical mural thrombus identified May 2015.  EXAM: MRI HEAD WITHOUT CONTRAST  TECHNIQUE: Multiplanar, multiecho pulse sequences of the brain and surrounding structures were obtained without intravenous  contrast.  COMPARISON:  CT angiogram of the head and neck February 19, 2014 and MRI of the head June 29, 2011  FINDINGS: No reduced diffusion to suggest acute ischemia. Minimal susceptibility artifact within the LEFT mesial occipital lobe corresponding to cystic encephalomalacia present on prior imaging. No midline shift or mass effect. Ventricles and sulci are overall normal for patient's age. No suspicious parenchymal signal. No abnormal extra-axial fluid collections. Normal major intracranial vascular flow voids seen at the skull base.  Ocular globes and orbital contents are unremarkable. No paranasal sinus air-fluid levels. Trace fluid signal in the mastoid air cells. No abnormal sellar expansion. No cerebellar tonsillar ectopia. No suspicious calvarial bone marrow signal.  IMPRESSION: No acute intracranial process, specifically no evidence of acute ischemia.  Remote small LEFT posterior cerebral artery territory infarct.   Electronically Signed   By: Awilda Metro   On: 02/21/2014 00:43   Medications: I have reviewed the patient's current medications. Scheduled Meds: .  stroke: mapping our early stages of recovery book   Does not apply Once  . aspirin EC  81 mg Oral Daily  . atorvastatin  80 mg Oral Daily  . enoxaparin (LOVENOX) injection  1.5 mg/kg Subcutaneous Q24H  . labetalol  10 mg Intravenous Once  . warfarin  10 mg Oral ONCE-1800  . Warfarin - Pharmacist Dosing Inpatient   Does not apply q1800   Continuous Infusions:  PRN Meds:.acetaminophen, ibuprofen, perflutren lipid microspheres (DEFINITY) IV suspension Assessment/Plan: Principal Problem:   TIA (transient ischemic attack) Active Problems:   Coronary Artery Disease   Antithrombin III deficiency   Long term current use of anticoagulant therapy   Headache   Stroke  43 yo femle with hx of CVA, CAD, HTN, antithrombin III deficiency, and Apical mural thrombus seen on May 2015, here with TIA. Patient has been off coumadin for 6  days for scheduled dental extraction.   TIA - patient had left sided headache, Left sided weakness, numbness, blurred vision, slurred speech. CT head showed old remote left occipital infarct. CTA head/neck negative for acute changes. The symptoms all resolved suggesting TIA. - of note, patient had been off couamdin for ~ 6 days for dental surgery scheduled for today. This is unlikely the precipitant since CT doesn't show any new embolic stroke. MRI brain shows remote small left posterior cerebral artery territory infarct. TIA was likely due for some other cause, such as vessel stenosis or hypotension.  - stroke team on board. They recommended TIA workup and risk modification.  - hgba1c 5.0 (last 5.2 on may 2015) so not diabetic. Lipid panel shows LDL 80.  - will increase lipitor from 40 to 80 daily for better LDL lowering (<70 goal). Cont asa. - encourage smoking cessation - continue coumadin. Will not stop coumadin without briding in the future, if he has procedures, he will likely need to bridge with lovenox when off  coumadin. - Dr. Jens Som can decided at follow up whether coumadin is still required. ECHO was done to see if she still has the mural thrombus. Coumadin can likely be stopped if does not have the thrombus.   Apical mural thrombus - seen on echo 08/02/2013.  - continue coumadin which was held for dental extraction. In the future, will need to have lovenox bridging during procedures for which coumadin needs to be held. Coumadin may be taken off if she doesn't have the apical thrombus anymore, that's why we did the ECHO, read pending. Will not keep patient here waiting for read. Can be followed up outpatient with Dr. Jens Som.  HA - likely tension headache as she does have some tensed muscle on the neck, unlikely migraine as no hx of migraine but possible - better with tylenol and also giving her ibuprofen PRN for headache. Improving.  Diarrhea - had episodes of loose stools. C. Diff  negative. Likely viral. Encouraged PO hydration.  - diarrhea improving.  CAD - apical LAD infarct 2000, NSTEMi s/p BMS prox LAD 2009.  - is on 81mg  daily asa daily. Cont this. - increased lipitor from 40 to 80mg  daily. Patient has not been taking lipitor regularly.  - currently not on beta blocker but needs to be for her CAD. See below.  HTN - had been on coreg in the past but patient wasn't taking it. Dr. Jens Som switched her to Toprol XL 25 qdaily but patient wasn't taking it either. She was having palpitations, that was likely why toprol was chosen.  - BP currently under control so will not start it currently. Consider starting toprol or coreg as outpatient follow up for her CAD.  Antithrombin III Deficiency - unclear history. No official record showing the diagnosis. Per Dr. Myna Hidalgo hem-onc previously, this may not be accurate diagnosis. She may have protein C deficiency.  - regardless, she needs to be on coumadin for her apical mural thrombus seen on May 2015 - asked her to try to get the records from her physician in [place], [place] where she was diagnosed with this. She will try to find their information at home.   Tobacco abuse - smokes 1/4 ppd. Also hx of cocaine abuse UDS negative this time but was + 3 months ago  - encourage smoking and cocaine cessation.  Dispo: Disposition is deferred at this time, awaiting improvement of current medical problems.  Anticipated discharge in approximately 1-2 day(s).   The patient does have a current PCP (Ejiroghene Wendall Stade, MD) and does need an [facility] hospital follow-up appointment after discharge.  The patient does have transportation limitations that hinder transportation to clinic appointments.  .Services Needed at time of discharge: Y = Yes, Blank = No PT:   OT:   RN:   Equipment:   Other:     LOS: 3 days   Lindsey Meeker, MD 02/22/2014, 12:50 PM

## 2014-02-22 NOTE — Progress Notes (Signed)
  Echocardiogram 2D Echocardiogram has been performed.  Delcie Roch 02/22/2014, 11:11 AM

## 2014-02-23 ENCOUNTER — Encounter (HOSPITAL_COMMUNITY): Payer: Self-pay | Admitting: Cardiovascular Disease

## 2014-02-28 ENCOUNTER — Encounter: Payer: Medicaid Other | Admitting: Internal Medicine

## 2014-03-03 ENCOUNTER — Encounter: Payer: Self-pay | Admitting: Cardiology

## 2014-03-03 ENCOUNTER — Ambulatory Visit (INDEPENDENT_AMBULATORY_CARE_PROVIDER_SITE_OTHER): Payer: Medicaid Other | Admitting: Cardiology

## 2014-03-03 VITALS — BP 140/88 | HR 102 | Ht 66.0 in | Wt 166.7 lb

## 2014-03-03 DIAGNOSIS — I2109 ST elevation (STEMI) myocardial infarction involving other coronary artery of anterior wall: Secondary | ICD-10-CM

## 2014-03-03 DIAGNOSIS — D6859 Other primary thrombophilia: Secondary | ICD-10-CM

## 2014-03-03 DIAGNOSIS — I513 Intracardiac thrombosis, not elsewhere classified: Secondary | ICD-10-CM

## 2014-03-03 DIAGNOSIS — R002 Palpitations: Secondary | ICD-10-CM

## 2014-03-03 MED ORDER — METOPROLOL SUCCINATE ER 25 MG PO TB24: 25.0000 mg | ORAL_TABLET | Freq: Every day | ORAL | Status: DC

## 2014-03-03 NOTE — Assessment & Plan Note (Signed)
Monitor shows sinus to sinus tachycardia with occasional PVC. Add Toprol 25 mg daily.

## 2014-03-03 NOTE — Assessment & Plan Note (Signed)
Blood pressure mildly elevated. Add Toprol 25 mg daily for blood pressure, PVCs and palpitations.

## 2014-03-03 NOTE — Progress Notes (Signed)
HPI: FU CAD; s/p apical infarct in 2000 and NSTEMI in 2009 tx with BMS to LAD, prior CVA, ? Anti-thrombin III deficiency, HTN, HL. Admitted in 07/2013 with pancreatitis. Echo demonstrated apical mural thrombus. Placed on coumadin. ASA and Plavix were d/c'd. Admitted recently with possible TIA. Recent monitor showed sinus to sinus tachycardia with PVCs. Since last seen, there is no dyspnea, chest pain, palpitations or syncope.   Studies: - LHC (12/2011): pLAD stent ok with 30-40%, mLAD with myocardial bridging, EF 55%, apical akinesis with apical aneurysm (no change) >>> med Rx.  - Echo (12/15): ejection fraction 50-55%. No apical thrombus.  - carotid Dopplers 02/20/2014-1-39% bilateral stenosis.  Current Outpatient Prescriptions  Medication Sig Dispense Refill  . acetaminophen (TYLENOL) 325 MG tablet Take 2 tablets (650 mg total) by mouth every 6 (six) hours as needed for moderate pain, fever or headache. 30 tablet 0  . aspirin EC 81 MG tablet Take 81 mg by mouth daily.    Marland Kitchen. atorvastatin (LIPITOR) 40 MG tablet Take 2 tablets (80 mg total) by mouth daily. 90 tablet 11  . ibuprofen (ADVIL,MOTRIN) 600 MG tablet Take 1 tablet (600 mg total) by mouth every 6 (six) hours as needed for headache. 30 tablet 0  . nitroGLYCERIN (NITROSTAT) 0.4 MG SL tablet Place 1 tablet (0.4 mg total) under the tongue every 5 (five) minutes as needed for chest pain (up to 3 doses). 25 tablet 3  . warfarin (COUMADIN) 5 MG tablet Take 1.5 tablets (7.5 mg total) by mouth daily. 42 tablet 0   No current facility-administered medications for this visit.     Past Medical History  Diagnosis Date  . Stroke     assoc with short term memory loss and right peripheral vision loss; age 43   . Blood transfusion   . Anxiety   . Coronary artery disease     Apical LAD infarction '00; NSTEMI s/p BMS to prox LAD '09; Cath 12/2011 single vessel CAD w/ patent LAD stent w/ stable mild ISR, nl LV systolic fxn  . Anemia    . Antithrombin III deficiency     ?pt not sure if true diagnosis  . Tobacco abuse   . High blood pressure   . Myocardial infarction     Past Surgical History  Procedure Laterality Date  . Tubal ligation    . Cardiac catheterization    . Esophagogastroduodenoscopy N/A 06/09/2012    Procedure: ESOPHAGOGASTRODUODENOSCOPY (EGD);  Surgeon: Theda BelfastPatrick D Hung, MD;  [facility];  Service: Endoscopy;  Laterality: N/A;  . Coronary angioplasty    . Esophagogastroduodenoscopy N/A 08/02/2013    Procedure: ESOPHAGOGASTRODUODENOSCOPY (EGD);  Surgeon: Meryl DareMalcolm T Stark, MD;  [facility];  Service: Endoscopy;  Laterality: N/A;  . Left heart catheterization with coronary angiogram N/A 12/24/2011    Procedure: LEFT HEART CATHETERIZATION WITH CORONARY ANGIOGRAM;  Surgeon: Kathleene Hazelhristopher D McAlhany, MD;  [facility];  Service: Cardiovascular;  Laterality: N/A;    History   Social History  . Marital Status: Divorced    Spouse Name: N/A    Number of Children: 2  . Years of Education: N/A   Occupational History  . Not on file.   Social History Main Topics  . Smoking status: Current Every Day Smoker -- 0.20 packs/day for 1 years    Types: Cigarettes  . Smokeless tobacco: Never Used  . Alcohol Use: 0.0 oz/week    0 Cans of beer per week  Comment: occasion  . Drug Use: Yes    Special: Cocaine     Comment: hx of use x 1  . Sexual Activity: No   Other Topics Concern  . Not on file   Social History Narrative    ROS: no fevers or chills, productive cough, hemoptysis, dysphasia, odynophagia, melena, hematochezia, dysuria, hematuria, rash, seizure activity, orthopnea, PND, pedal edema, claudication. Remaining systems are negative.  Physical Exam: Well-developed well-nourished in no acute distress.  Skin is warm and dry.  HEENT is normal.  Neck is supple.  Chest is clear to auscultation with normal expansion.  Cardiovascular exam is regular rate and rhythm.    Abdominal exam nontender or distended. No masses palpated. Extremities show no edema. neuro grossly intact

## 2014-03-03 NOTE — Assessment & Plan Note (Signed)
Question anti-thrombin 3 deficiency. Coumadin as outlined under apical thrombus.

## 2014-03-03 NOTE — Assessment & Plan Note (Signed)
Discontinue aspirin given need for Coumadin. Continue statin. 

## 2014-03-03 NOTE — Patient Instructions (Signed)
Your physician recommends that you schedule a follow-up appointment in: 3 MONTHS WITH DR CRENSHAW  START METOPROLOL SUCC ER 25 MG ONCE DAILY AT BEDTIME  STOP ASPIRIN  RESCHEDULE COUMADIN CHECKS TO [facility]

## 2014-03-03 NOTE — Assessment & Plan Note (Signed)
Based on recent echo with contrast or thrombus has resolved. However she developed this years following her previous infarct and therefore has a propensity to form thrombi at the apex. I therefore feel she will need to continue Coumadin particularly in light of previous CVA. Goal INR 2-3.

## 2014-03-03 NOTE — Assessment & Plan Note (Signed)
Continue statin. 

## 2014-03-09 ENCOUNTER — Encounter: Payer: Medicaid Other | Admitting: Physician Assistant

## 2014-03-15 ENCOUNTER — Encounter (HOSPITAL_COMMUNITY): Payer: Self-pay | Admitting: *Deleted

## 2014-03-15 ENCOUNTER — Emergency Department (HOSPITAL_COMMUNITY): Payer: Medicaid Other

## 2014-03-15 ENCOUNTER — Emergency Department (HOSPITAL_COMMUNITY)
Admission: EM | Admit: 2014-03-15 | Discharge: 2014-03-16 | Disposition: A | Discharge status: Home / Self Care | Payer: Medicaid Other | Attending: Emergency Medicine | Admitting: Emergency Medicine

## 2014-03-15 DIAGNOSIS — Z7901 Long term (current) use of anticoagulants: Secondary | ICD-10-CM | POA: Diagnosis not present

## 2014-03-15 DIAGNOSIS — R0789 Other chest pain: Secondary | ICD-10-CM | POA: Insufficient documentation

## 2014-03-15 DIAGNOSIS — Z8659 Personal history of other mental and behavioral disorders: Secondary | ICD-10-CM | POA: Diagnosis not present

## 2014-03-15 DIAGNOSIS — M79604 Pain in right leg: Secondary | ICD-10-CM | POA: Insufficient documentation

## 2014-03-15 DIAGNOSIS — R079 Chest pain, unspecified: Secondary | ICD-10-CM | POA: Diagnosis present

## 2014-03-15 DIAGNOSIS — I252 Old myocardial infarction: Secondary | ICD-10-CM | POA: Diagnosis not present

## 2014-03-15 DIAGNOSIS — Z9889 Other specified postprocedural states: Secondary | ICD-10-CM | POA: Diagnosis not present

## 2014-03-15 DIAGNOSIS — Z720 Tobacco use: Secondary | ICD-10-CM | POA: Diagnosis not present

## 2014-03-15 DIAGNOSIS — Z79899 Other long term (current) drug therapy: Secondary | ICD-10-CM | POA: Insufficient documentation

## 2014-03-15 DIAGNOSIS — Z862 Personal history of diseases of the blood and blood-forming organs and certain disorders involving the immune mechanism: Secondary | ICD-10-CM | POA: Diagnosis not present

## 2014-03-15 DIAGNOSIS — I10 Essential (primary) hypertension: Secondary | ICD-10-CM | POA: Insufficient documentation

## 2014-03-15 DIAGNOSIS — Z9861 Coronary angioplasty status: Secondary | ICD-10-CM | POA: Insufficient documentation

## 2014-03-15 DIAGNOSIS — I2510 Atherosclerotic heart disease of native coronary artery without angina pectoris: Secondary | ICD-10-CM | POA: Insufficient documentation

## 2014-03-15 LAB — BASIC METABOLIC PANEL
Anion gap: 12 (ref 5–15)
CHLORIDE: 105 meq/L (ref 96–112)
CO2: 22 mmol/L (ref 19–32)
Calcium: 9 mg/dL (ref 8.4–10.5)
Creatinine, Ser: 0.54 mg/dL (ref 0.50–1.10)
Glucose, Bld: 140 mg/dL — ABNORMAL HIGH (ref 70–99)
POTASSIUM: 3.3 mmol/L — AB (ref 3.5–5.1)
Sodium: 139 mmol/L (ref 135–145)

## 2014-03-15 LAB — I-STAT TROPONIN, ED
Troponin i, poc: 0 ng/mL (ref 0.00–0.08)
Troponin i, poc: 0 ng/mL (ref 0.00–0.08)

## 2014-03-15 LAB — CBC
HEMATOCRIT: 33.5 % — AB (ref 36.0–46.0)
Hemoglobin: 11 g/dL — ABNORMAL LOW (ref 12.0–15.0)
MCH: 30.6 pg (ref 26.0–34.0)
MCHC: 32.8 g/dL (ref 30.0–36.0)
MCV: 93.1 fL (ref 78.0–100.0)
PLATELETS: 208 10*3/uL (ref 150–400)
RBC: 3.6 MIL/uL — ABNORMAL LOW (ref 3.87–5.11)
RDW: 16.8 % — AB (ref 11.5–15.5)
WBC: 7.1 10*3/uL (ref 4.0–10.5)

## 2014-03-15 LAB — D-DIMER, QUANTITATIVE: D-Dimer, Quant: 0.51 ug/mL-FEU — ABNORMAL HIGH (ref 0.00–0.48)

## 2014-03-15 LAB — PROTIME-INR
INR: 1.03 (ref 0.00–1.49)
Prothrombin Time: 13.6 seconds (ref 11.6–15.2)

## 2014-03-15 MED ORDER — ENOXAPARIN SODIUM 80 MG/0.8ML ~~LOC~~ SOLN
1.0000 mg/kg | Freq: Once | SUBCUTANEOUS | Status: AC
  Administered 2014-03-16: 75 mg via SUBCUTANEOUS
  Filled 2014-03-15: qty 0.8

## 2014-03-15 MED ORDER — MORPHINE SULFATE 4 MG/ML IJ SOLN
4.0000 mg | Freq: Once | INTRAMUSCULAR | Status: AC
  Administered 2014-03-15: 4 mg via INTRAVENOUS
  Filled 2014-03-15: qty 1

## 2014-03-15 MED ORDER — IOHEXOL 350 MG/ML SOLN: 80.0000 mL | Freq: Once | INTRAVENOUS | Status: AC | PRN

## 2014-03-15 MED ORDER — HYDROMORPHONE HCL 1 MG/ML IJ SOLN
1.0000 mg | Freq: Once | INTRAMUSCULAR | Status: AC
  Administered 2014-03-15: 1 mg via INTRAVENOUS
  Filled 2014-03-15: qty 1

## 2014-03-15 MED ORDER — OXYCODONE-ACETAMINOPHEN 5-325 MG PO TABS: 1.0000 | ORAL_TABLET | Freq: Four times a day (QID) | ORAL | Status: DC | PRN

## 2014-03-15 MED ORDER — ASPIRIN 325 MG PO TABS
325.0000 mg | ORAL_TABLET | Freq: Once | ORAL | Status: AC
  Administered 2014-03-15: 325 mg via ORAL
  Filled 2014-03-15: qty 1

## 2014-03-15 MED ORDER — NITROGLYCERIN 0.4 MG SL SUBL
0.4000 mg | SUBLINGUAL_TABLET | SUBLINGUAL | Status: AC | PRN
  Administered 2014-03-15 (×3): 0.4 mg via SUBLINGUAL
  Filled 2014-03-15: qty 1

## 2014-03-15 NOTE — ED Provider Notes (Signed)
CSN: [id number]     Arrival date & time 03/15/14  1733 History   First MD Initiated Contact with Patient 03/15/14 1954     Chief Complaint  Patient presents with  . Chest Pain  . Leg Pain     (Consider location/radiation/quality/duration/timing/severity/associated sxs/prior Treatment) Patient is a 43 y.o. female presenting with chest pain. The history is provided by the patient. No language interpreter was used.  Chest Pain Pain location:  Substernal area Pain quality comment:  Squeezing Pain radiates to:  Does not radiate Pain radiates to the back: no   Pain severity:  Moderate Duration:  5 hours Timing:  Constant Progression:  Waxing and waning Chronicity:  New Context: at rest   Relieved by:  Nothing Worsened by:  Nothing tried Ineffective treatments:  None tried Associated symptoms: no abdominal pain, no anxiety, no back pain, no claudication, no cough, no diaphoresis, no fatigue, no fever, no headache, no lower extremity edema, no nausea, no numbness, no palpitations, no shortness of breath, not vomiting and no weakness   Associated symptoms comment:  2 days of R leg pain from R back down buttocks to thigh Risk factors: aortic disease and prior DVT/PE (per pt, but not seen in chart)   Risk factors comment:  Antithrombin III def   Past Medical History  Diagnosis Date  . Stroke     assoc with short term memory loss and right peripheral vision loss; age 51   . Blood transfusion   . Anxiety   . Coronary artery disease     Apical LAD infarction '00; NSTEMI s/p BMS to prox LAD '09; Cath 12/2011 single vessel CAD w/ patent LAD stent w/ stable mild ISR, nl LV systolic fxn  . Anemia   . Antithrombin III deficiency     ?pt not sure if true diagnosis  . Tobacco abuse   . High blood pressure   . Myocardial infarction    Past Surgical History  Procedure Laterality Date  . Tubal ligation    . Cardiac catheterization    . Esophagogastroduodenoscopy N/A 06/09/2012     Procedure: ESOPHAGOGASTRODUODENOSCOPY (EGD);  Surgeon: Theda Belfast, MD;  Location: [facility];  Service: Endoscopy;  Laterality: N/A;  . Coronary angioplasty    . Esophagogastroduodenoscopy N/A 08/02/2013    Procedure: ESOPHAGOGASTRODUODENOSCOPY (EGD);  Surgeon: Meryl Dare, MD;  Location: [facility] ENDOSCOPY;  Service: Endoscopy;  Laterality: N/A;  . Left heart catheterization with coronary angiogram N/A 12/24/2011    Procedure: LEFT HEART CATHETERIZATION WITH CORONARY ANGIOGRAM;  Surgeon: Kathleene Hazel, MD;  Location: [facility] CATH LAB;  Service: Cardiovascular;  Laterality: N/A;   Family History  Problem Relation Age of Onset  . Heart disease Brother     arrhythmia; died  . Breast cancer Maternal Aunt    History  Substance Use Topics  . Smoking status: Current Every Day Smoker -- 0.20 packs/day for 1 years    Types: Cigarettes  . Smokeless tobacco: Never Used  . Alcohol Use: 0.0 oz/week    0 Cans of beer per week     Comment: occasion   OB History    No data available     Review of Systems  Constitutional: Negative for fever, chills, diaphoresis, activity change, appetite change and fatigue.  HENT: Negative for congestion, facial swelling, rhinorrhea and sore throat.   Eyes: Negative for photophobia and discharge.  Respiratory: Negative for cough, chest tightness and shortness of breath.   Cardiovascular: Positive for chest pain. Negative for  palpitations, claudication and leg swelling.  Gastrointestinal: Negative for nausea, vomiting, abdominal pain and diarrhea.  Endocrine: Negative for polydipsia and polyuria.  Genitourinary: Negative for dysuria, frequency, difficulty urinating and pelvic pain.  Musculoskeletal: Negative for back pain, arthralgias, neck pain and neck stiffness.  Skin: Negative for color change and wound.  Allergic/Immunologic: Negative for immunocompromised state.  Neurological: Negative for facial asymmetry, weakness, numbness and headaches.   Hematological: Does not bruise/bleed easily.  Psychiatric/Behavioral: Negative for confusion and agitation.      Allergies  Review of patient's allergies indicates no known allergies.  Home Medications   Prior to Admission medications   Medication Sig Start Date End Date Taking? Authorizing Provider  atorvastatin (LIPITOR) 40 MG tablet Take 2 tablets (80 mg total) by mouth daily. Patient taking differently: Take 80 mg by mouth at bedtime.  02/21/14 02/21/15 Yes Tasrif Ahmed, MD  metoprolol succinate (TOPROL XL) 25 MG 24 hr tablet Take 1 tablet (25 mg total) by mouth daily. 03/03/14  Yes Lewayne Bunting, MD  nitroGLYCERIN (NITROSTAT) 0.4 MG SL tablet Place 1 tablet (0.4 mg total) under the tongue every 5 (five) minutes as needed for chest pain (up to 3 doses). 10/14/13  Yes Beatrice Lecher, PA-C  warfarin (COUMADIN) 5 MG tablet Take 1.5 tablets (7.5 mg total) by mouth daily. 10/19/13  Yes Ejiroghene Wendall Stade, MD  acetaminophen (TYLENOL) 325 MG tablet Take 2 tablets (650 mg total) by mouth every 6 (six) hours as needed for moderate pain, fever or headache. 02/21/14   Hyacinth Meeker, MD  ibuprofen (ADVIL,MOTRIN) 600 MG tablet Take 1 tablet (600 mg total) by mouth every 6 (six) hours as needed for headache. 02/21/14   Hyacinth Meeker, MD  oxyCODONE-acetaminophen (PERCOCET) 5-325 MG per tablet Take 1 tablet by mouth every 6 (six) hours as needed. 03/15/14   Toy Cookey, MD   BP 116/88 mmHg  Pulse 88  Temp(Src) 98.2 F (36.8 C)  Resp 18  SpO2 100%  LMP 02/19/2014 (Exact Date) Physical Exam  Constitutional: She is oriented to person, place, and time. She appears well-developed and well-nourished. No distress.  HENT:  Head: Normocephalic and atraumatic.  Mouth/Throat: No oropharyngeal exudate.  Eyes: Pupils are equal, round, and reactive to light.  Neck: Normal range of motion. Neck supple.  Cardiovascular: Normal rate, regular rhythm and normal heart sounds.  Exam reveals no gallop and no  friction rub.   No murmur heard. Pulmonary/Chest: Effort normal and breath sounds normal. No respiratory distress. She has no wheezes. She has no rales.  Abdominal: Soft. Bowel sounds are normal. She exhibits no distension and no mass. There is no tenderness. There is no rebound and no guarding.  Musculoskeletal: Normal range of motion. She exhibits no edema or tenderness.   Positive straight leg raise on right  Neurological: She is alert and oriented to person, place, and time. She has normal strength. She displays no tremor. No cranial nerve deficit or sensory deficit. She exhibits normal muscle tone. Coordination normal. GCS eye subscore is 4. GCS verbal subscore is 5. GCS motor subscore is 6.  Pain with flexion and extension of hip and knee.  Skin: Skin is warm and dry.  Psychiatric: She has a normal mood and affect.    ED Course  Procedures (including critical care time) Labs Review Labs Reviewed  CBC - Abnormal; Notable for the following:    RBC 3.60 (*)    Hemoglobin 11.0 (*)    HCT 33.5 (*)    RDW  16.8 (*)    All other components within normal limits  BASIC METABOLIC PANEL - Abnormal; Notable for the following:    Potassium 3.3 (*)    Glucose, Bld 140 (*)    BUN <5 (*)    All other components within normal limits  D-DIMER, QUANTITATIVE - Abnormal; Notable for the following:    D-Dimer, Quant 0.51 (*)    All other components within normal limits  PROTIME-INR  I-STAT TROPOININ, ED  I-STAT TROPOININ, ED    Imaging Review Dg Chest 2 View  03/15/2014   CLINICAL DATA:  Chest pain, shortness of breath  EXAM: CHEST  2 VIEW  COMPARISON:  CT chest 10/30/2013  FINDINGS: The heart size and mediastinal contours are within normal limits. Both lungs are clear. The visualized skeletal structures are unremarkable.  IMPRESSION: No active cardiopulmonary disease.   Electronically Signed   By: Elige Ko   On: 03/15/2014 18:13   Ct Angio Chest Pe W/cm &/or Wo Cm  03/15/2014   CLINICAL  DATA:  Pt reports having right leg pain and numbness sensation x 2 days. Went to eat after work today and had onset chest tightness and squeezing that she thought was indigestion. Reports having cardiac hx, took 1 nitro pta with no relief. Airway intact and ekg done.  EXAM: CT ANGIOGRAPHY CHEST WITH CONTRAST  TECHNIQUE: Multidetector CT imaging of the chest was performed using the standard protocol during bolus administration of intravenous contrast. Multiplanar CT image reconstructions and MIPs were obtained to evaluate the vascular anatomy.  CONTRAST:  100 cc Omnipaque 300  COMPARISON:  Chest radiograph of earlier today and CT of 10/30/2013  FINDINGS: Lungs/Pleura:  No airspace opacities.  No pleural fluid.  Heart/Mediastinum: The quality of this examination for evaluation of pulmonary embolism is good. No evidence of pulmonary embolism.  No evidence of aortic dissection or aneurysm. Probable pericardial recess adjacent the ascending aorta on image 39 of series 4.  Pulmonary artery enlargement, with the outflow tract measuring 3.2 cm. Normal heart size, without pericardial effusion. LAD coronary artery stent. No mediastinal or hilar adenopathy.  Upper Abdomen: Normal imaged portions of the liver, spleen, stomach.  Bones/Musculoskeletal:  No acute osseous abnormality.  Review of the MIP images confirms the above findings.  IMPRESSION: 1.  No evidence of pulmonary embolism. 2. Pulmonary artery enlargement suggests pulmonary arterial hypertension. 3. LAD coronary artery stent.   Electronically Signed   By: Jeronimo Greaves M.D.   On: 03/15/2014 22:34     EKG Interpretation   Date/Time:  Wednesday March 15 2014 17:39:07 EST Ventricular Rate:  105 PR Interval:  148 QRS Duration: 76 QT Interval:  330 QTC Calculation: 436 R Axis:   94 Text Interpretation:  Sinus tachycardia Rightward axis Cannot rule out  Anteroseptal infarct , age undetermined Abnormal ECG Axis on prior EKG  borderline, and had nml rate,  otherwise unchanged.  Confirmed by Vance Belcourt   MD, Kameron Glazebrook [phone]) on 03/15/2014 11:54:06 PM      MDM   Final diagnoses:  Chest pain at rest  Right leg pain    Pt is a 43 y.o. female with Pmhx as above who presents with R leg pain/numbness for 2 days starting from R back/buttocks and radiating down R leg, now also with central chest tightness/squeezing, not relieved by NTG.  EKG sinus tach without acute ischemic changes, troponin is negative.  D-dimer is mildly elevated, prompting a CTA to be ordered, and was negative for PE.  INR subtherapeutic, which  is to be expected given that she has been off her Coumadin for 1 week in preparation for dental extraction.  Pain was unchanged with nitroglycerin as well as 2 doses of IV morphine, but was much improved by one dose of IV Dilaudid  Spoke w/ cardiology who feels CP atypical given no EKG changes, neg trop despite persistent pain, and recommends repeat trop.  5 hour repeat troponin is negative.  She will be given 1 dose of subcutaneous Lovenox in department and will be set up for an outpatient vascular DVT study tomorrow.  She agrees to call Dr. Jens Somrenshaw for outpatient cardiology follow-up.  I feel ACS is unlikely given her constant pain with negative delta troponins and EKG without acute ischemic changes.    Albertine PatriciaLareese W Weare evaluation in the Emergency Department is complete. It has been determined that no acute conditions requiring further emergency intervention are present at this time. The patient/guardian have been advised of the diagnosis and plan. We have discussed signs and symptoms that warrant return to the ED, such as changes or worsening in symptoms, worsening pain, worsening charts breath, fever, leg swelling.      Toy CookeyMegan Audy Dauphine, MD 03/16/14 0000

## 2014-03-15 NOTE — ED Notes (Signed)
Pt reports having right leg pain and numbness sensation x 2 days. Went to eat after work today and had onset chest tightness and squeezing that she thought was indigestion. Reports having cardiac hx, took 1 nitro pta with no relief. Airway intact and ekg done.

## 2014-03-15 NOTE — ED Notes (Signed)
MD at bedside. 

## 2014-03-15 NOTE — Discharge Instructions (Signed)
IMPORTANT PATIENT INSTRUCTIONS:  You have been scheduled for an Outpatient Vascular Study at [facility].    If tomorrow is a Saturday or Sunday, please go to the [facility] Emergency Department Registration Desk at 8:30 am tomorrow morning and tell them you are there for a vascular study.  If tomorrow is a weekday (Monday-Friday), please go to [facility] Admitting Department at 8:30 am and tell them you are there for a vascular study.   Chest Pain (Nonspecific) It is often hard to give a specific diagnosis for the cause of chest pain. There is always a chance that your pain could be related to something serious, such as a heart attack or a blood clot in the lungs. You need to follow up with your health care provider for further evaluation. CAUSES   Heartburn.  Pneumonia or bronchitis.  Anxiety or stress.  Inflammation around your heart (pericarditis) or lung (pleuritis or pleurisy).  A blood clot in the lung.  A collapsed lung (pneumothorax). It can develop suddenly on its own (spontaneous pneumothorax) or from trauma to the chest.  Shingles infection (herpes zoster virus). The chest wall is composed of bones, muscles, and cartilage. Any of these can be the source of the pain.  The bones can be bruised by injury.  The muscles or cartilage can be strained by coughing or overwork.  The cartilage can be affected by inflammation and become sore (costochondritis). DIAGNOSIS  Lab tests or other studies may be needed to find the cause of your pain. Your health care provider may have you take a test called an ambulatory electrocardiogram (ECG). An ECG records your heartbeat patterns over a 24-hour period. You may also have other tests, such as:  Transthoracic echocardiogram (TTE). During echocardiography, sound waves are used to evaluate how blood flows through your heart.  Transesophageal echocardiogram (TEE).  Cardiac monitoring. This allows your health care provider to monitor  your heart rate and rhythm in real time.  Holter monitor. This is a portable device that records your heartbeat and can help diagnose heart arrhythmias. It allows your health care provider to track your heart activity for several days, if needed.  Stress tests by exercise or by giving medicine that makes the heart beat faster. TREATMENT   Treatment depends on what may be causing your chest pain. Treatment may include:  Acid blockers for heartburn.  Anti-inflammatory medicine.  Pain medicine for inflammatory conditions.  Antibiotics if an infection is present.  You may be advised to change lifestyle habits. This includes stopping smoking and avoiding alcohol, caffeine, and chocolate.  You may be advised to keep your head raised (elevated) when sleeping. This reduces the chance of acid going backward from your stomach into your esophagus. Most of the time, nonspecific chest pain will improve within 2-3 days with rest and mild pain medicine.  HOME CARE INSTRUCTIONS   If antibiotics were prescribed, take them as directed. Finish them even if you start to feel better.  For the next few days, avoid physical activities that bring on chest pain. Continue physical activities as directed.  Do not use any tobacco products, including cigarettes, chewing tobacco, or electronic cigarettes.  Avoid drinking alcohol.  Only take medicine as directed by your health care provider.  Follow your health care provider's suggestions for further testing if your chest pain does not go away.  Keep any follow-up appointments you made. If you do not go to an appointment, you could develop lasting (chronic) problems with pain.  If there is any problem keeping an appointment, call to reschedule. SEEK MEDICAL CARE IF:   Your chest pain does not go away, even after treatment.  You have a rash with blisters on your chest.  You have a fever. SEEK IMMEDIATE MEDICAL CARE IF:   You have increased chest pain or  pain that spreads to your arm, neck, jaw, back, or abdomen.  You have shortness of breath.  You have an increasing cough, or you cough up blood.  You have severe back or abdominal pain.  You feel nauseous or vomit.  You have severe weakness.  You faint.  You have chills. This is an emergency. Do not wait to see if the pain will go away. Get medical help at once. Call your local emergency services (911 in [place] Do not drive yourself to the hospital. MAKE SURE YOU:   Understand these instructions.  Will watch your condition.  Will get help right away if you are not doing well or get worse. Document Released: 12/11/2004 Document Revised: 03/08/2013 Document Reviewed: 10/07/2007 [facility] Patient Information 2015 [place], [place]. This information is not intended to replace advice given to you by your health care provider. Make sure you discuss any questions you have with your health care provider.

## 2014-03-15 NOTE — ED Notes (Signed)
Patient returned from CT

## 2014-03-16 ENCOUNTER — Encounter (HOSPITAL_COMMUNITY): Payer: Medicaid Other

## 2014-03-16 MED ORDER — ONDANSETRON HCL 4 MG/2ML IJ SOLN
4.0000 mg | Freq: Once | INTRAMUSCULAR | Status: AC
  Administered 2014-03-16: 4 mg via INTRAVENOUS
  Filled 2014-03-16: qty 2

## 2014-03-16 NOTE — ED Notes (Signed)
Pt actively vomiting upon discharge, MD informed, see new orders.

## 2014-03-26 ENCOUNTER — Encounter (HOSPITAL_COMMUNITY): Payer: Self-pay | Admitting: Emergency Medicine

## 2014-03-26 ENCOUNTER — Inpatient Hospital Stay (HOSPITAL_COMMUNITY)
Admission: EM | Admit: 2014-03-26 | Discharge: 2014-03-30 | DRG: 313 | Disposition: A | Discharge status: Home / Self Care | Payer: Medicaid Other | Attending: Oncology | Admitting: Oncology

## 2014-03-26 ENCOUNTER — Emergency Department (HOSPITAL_COMMUNITY): Payer: Medicaid Other

## 2014-03-26 DIAGNOSIS — M5117 Intervertebral disc disorders with radiculopathy, lumbosacral region: Secondary | ICD-10-CM

## 2014-03-26 DIAGNOSIS — K648 Other hemorrhoids: Secondary | ICD-10-CM | POA: Diagnosis present

## 2014-03-26 DIAGNOSIS — K5791 Diverticulosis of intestine, part unspecified, without perforation or abscess with bleeding: Secondary | ICD-10-CM | POA: Diagnosis present

## 2014-03-26 DIAGNOSIS — E785 Hyperlipidemia, unspecified: Secondary | ICD-10-CM | POA: Diagnosis present

## 2014-03-26 DIAGNOSIS — D6859 Other primary thrombophilia: Secondary | ICD-10-CM | POA: Diagnosis present

## 2014-03-26 DIAGNOSIS — M79606 Pain in leg, unspecified: Secondary | ICD-10-CM | POA: Diagnosis present

## 2014-03-26 DIAGNOSIS — I252 Old myocardial infarction: Secondary | ICD-10-CM

## 2014-03-26 DIAGNOSIS — R740 Nonspecific elevation of levels of transaminase and lactic acid dehydrogenase [LDH]: Secondary | ICD-10-CM

## 2014-03-26 DIAGNOSIS — Z8249 Family history of ischemic heart disease and other diseases of the circulatory system: Secondary | ICD-10-CM

## 2014-03-26 DIAGNOSIS — D688 Other specified coagulation defects: Secondary | ICD-10-CM

## 2014-03-26 DIAGNOSIS — Z9861 Coronary angioplasty status: Secondary | ICD-10-CM

## 2014-03-26 DIAGNOSIS — Z823 Family history of stroke: Secondary | ICD-10-CM

## 2014-03-26 DIAGNOSIS — Z8673 Personal history of transient ischemic attack (TIA), and cerebral infarction without residual deficits: Secondary | ICD-10-CM

## 2014-03-26 DIAGNOSIS — F17210 Nicotine dependence, cigarettes, uncomplicated: Secondary | ICD-10-CM | POA: Diagnosis present

## 2014-03-26 DIAGNOSIS — Z8241 Family history of sudden cardiac death: Secondary | ICD-10-CM

## 2014-03-26 DIAGNOSIS — Z7901 Long term (current) use of anticoagulants: Secondary | ICD-10-CM

## 2014-03-26 DIAGNOSIS — K625 Hemorrhage of anus and rectum: Secondary | ICD-10-CM | POA: Diagnosis present

## 2014-03-26 DIAGNOSIS — K649 Unspecified hemorrhoids: Secondary | ICD-10-CM

## 2014-03-26 DIAGNOSIS — Z955 Presence of coronary angioplasty implant and graft: Secondary | ICD-10-CM

## 2014-03-26 DIAGNOSIS — M79604 Pain in right leg: Secondary | ICD-10-CM | POA: Diagnosis present

## 2014-03-26 DIAGNOSIS — K921 Melena: Secondary | ICD-10-CM

## 2014-03-26 DIAGNOSIS — R7401 Elevation of levels of liver transaminase levels: Secondary | ICD-10-CM | POA: Diagnosis present

## 2014-03-26 DIAGNOSIS — M4800 Spinal stenosis, site unspecified: Secondary | ICD-10-CM | POA: Diagnosis present

## 2014-03-26 DIAGNOSIS — M79609 Pain in unspecified limb: Secondary | ICD-10-CM | POA: Insufficient documentation

## 2014-03-26 DIAGNOSIS — R0789 Other chest pain: Secondary | ICD-10-CM

## 2014-03-26 DIAGNOSIS — D649 Anemia, unspecified: Secondary | ICD-10-CM | POA: Diagnosis present

## 2014-03-26 DIAGNOSIS — M5126 Other intervertebral disc displacement, lumbar region: Secondary | ICD-10-CM | POA: Diagnosis present

## 2014-03-26 DIAGNOSIS — M79605 Pain in left leg: Secondary | ICD-10-CM

## 2014-03-26 DIAGNOSIS — R202 Paresthesia of skin: Secondary | ICD-10-CM | POA: Diagnosis present

## 2014-03-26 DIAGNOSIS — I513 Intracardiac thrombosis, not elsewhere classified: Secondary | ICD-10-CM | POA: Diagnosis present

## 2014-03-26 DIAGNOSIS — I10 Essential (primary) hypertension: Secondary | ICD-10-CM | POA: Diagnosis present

## 2014-03-26 DIAGNOSIS — Z79899 Other long term (current) drug therapy: Secondary | ICD-10-CM

## 2014-03-26 DIAGNOSIS — Z791 Long term (current) use of non-steroidal anti-inflammatories (NSAID): Secondary | ICD-10-CM

## 2014-03-26 DIAGNOSIS — I2510 Atherosclerotic heart disease of native coronary artery without angina pectoris: Secondary | ICD-10-CM | POA: Diagnosis present

## 2014-03-26 DIAGNOSIS — M5417 Radiculopathy, lumbosacral region: Secondary | ICD-10-CM | POA: Diagnosis present

## 2014-03-26 DIAGNOSIS — R079 Chest pain, unspecified: Principal | ICD-10-CM | POA: Diagnosis present

## 2014-03-26 LAB — MAGNESIUM: Magnesium: 1.6 mg/dL (ref 1.5–2.5)

## 2014-03-26 LAB — BASIC METABOLIC PANEL
Anion gap: 6 (ref 5–15)
BUN: 7 mg/dL (ref 6–23)
CO2: 25 mmol/L (ref 19–32)
Calcium: 8.4 mg/dL (ref 8.4–10.5)
Chloride: 109 mEq/L (ref 96–112)
Creatinine, Ser: 0.58 mg/dL (ref 0.50–1.10)
GFR calc Af Amer: >90 mL/min (ref >90)
Glucose, Bld: 92 mg/dL (ref 70–99)
Potassium: 4.4 mmol/L (ref 3.5–5.1)
Sodium: 140 mmol/L (ref 135–145)

## 2014-03-26 LAB — I-STAT TROPONIN, ED: TROPONIN I, POC: 0 ng/mL (ref 0.00–0.08)

## 2014-03-26 LAB — COMPREHENSIVE METABOLIC PANEL
ALT: 22 U/L (ref 0–35)
ANION GAP: 14 (ref 5–15)
AST: 44 U/L — AB (ref 0–37)
Albumin: 3.2 g/dL — ABNORMAL LOW (ref 3.5–5.2)
Alkaline Phosphatase: 95 U/L (ref 39–117)
BUN: 5 mg/dL — ABNORMAL LOW (ref 6–23)
CALCIUM: 8.9 mg/dL (ref 8.4–10.5)
CO2: 22 mmol/L (ref 19–32)
CREATININE: 0.64 mg/dL (ref 0.50–1.10)
Chloride: 107 mEq/L (ref 96–112)
GFR calc Af Amer: >90 mL/min (ref >90)
GFR calc non Af Amer: >90 mL/min (ref >90)
GLUCOSE: 96 mg/dL (ref 70–99)
Potassium: 3.7 mmol/L (ref 3.5–5.1)
Sodium: 143 mmol/L (ref 135–145)
TOTAL PROTEIN: 6.6 g/dL (ref 6.0–8.3)
Total Bilirubin: 0.8 mg/dL (ref 0.3–1.2)

## 2014-03-26 LAB — MRSA PCR SCREENING: MRSA by PCR: NEGATIVE

## 2014-03-26 LAB — APTT: aPTT: 27 s (ref 24–37)

## 2014-03-26 LAB — CBC
HCT: 29.8 % — ABNORMAL LOW (ref 36.0–46.0)
Hemoglobin: 9.9 g/dL — ABNORMAL LOW (ref 12.0–15.0)
MCH: 31.2 pg (ref 26.0–34.0)
MCHC: 33.2 g/dL (ref 30.0–36.0)
MCV: 94 fL (ref 78.0–100.0)
Platelets: 314 10*3/uL (ref 150–400)
RBC: 3.17 MIL/uL — AB (ref 3.87–5.11)
RDW: 17.7 % — ABNORMAL HIGH (ref 11.5–15.5)
WBC: 5.1 10*3/uL (ref 4.0–10.5)

## 2014-03-26 LAB — PREGNANCY, URINE: Preg Test, Ur: NEGATIVE

## 2014-03-26 LAB — TROPONIN I: Troponin I: <0.03 ng/mL

## 2014-03-26 LAB — RETICULOCYTES
RBC.: 3.32 MIL/uL — ABNORMAL LOW (ref 3.87–5.11)
RETIC CT PCT: 1.6 % (ref 0.4–3.1)
Retic Count, Absolute: 53.1 10*3/uL (ref 19.0–186.0)

## 2014-03-26 LAB — PHOSPHORUS: Phosphorus: 3.2 mg/dL (ref 2.3–4.6)

## 2014-03-26 LAB — RAPID URINE DRUG SCREEN, HOSP PERFORMED
AMPHETAMINES: NOT DETECTED
BARBITURATES: NOT DETECTED
BENZODIAZEPINES: NOT DETECTED
Cocaine: NOT DETECTED
Opiates: NOT DETECTED
Tetrahydrocannabinol: NOT DETECTED

## 2014-03-26 LAB — ANTITHROMBIN III: AntiThromb III Func: 77 % (ref 75–120)

## 2014-03-26 LAB — PROTIME-INR
INR: 0.98 (ref 0.00–1.49)
PROTHROMBIN TIME: 13.1 s (ref 11.6–15.2)

## 2014-03-26 LAB — CK: CK TOTAL: 162 U/L (ref 7–177)

## 2014-03-26 LAB — ETHANOL

## 2014-03-26 MED ORDER — HEPARIN BOLUS VIA INFUSION
3000.0000 [IU] | Freq: Once | INTRAVENOUS | Status: AC
  Administered 2014-03-27: 3000 [IU] via INTRAVENOUS
  Filled 2014-03-26: qty 3000

## 2014-03-26 MED ORDER — WARFARIN SODIUM 7.5 MG PO TABS
7.5000 mg | ORAL_TABLET | ORAL | Status: AC
  Administered 2014-03-27: 7.5 mg via ORAL
  Filled 2014-03-26: qty 1

## 2014-03-26 MED ORDER — MORPHINE SULFATE 4 MG/ML IJ SOLN
4.0000 mg | Freq: Once | INTRAMUSCULAR | Status: AC
  Administered 2014-03-26: 4 mg via INTRAVENOUS
  Filled 2014-03-26: qty 1

## 2014-03-26 MED ORDER — MORPHINE SULFATE 2 MG/ML IJ SOLN
1.0000 mg | INTRAMUSCULAR | Status: DC | PRN
  Administered 2014-03-26: 1 mg via INTRAVENOUS
  Filled 2014-03-26: qty 1

## 2014-03-26 MED ORDER — PANTOPRAZOLE SODIUM 40 MG PO TBEC
40.0000 mg | DELAYED_RELEASE_TABLET | Freq: Every day | ORAL | Status: DC
  Administered 2014-03-26 – 2014-03-30 (×5): 40 mg via ORAL
  Filled 2014-03-26 (×5): qty 1

## 2014-03-26 MED ORDER — WARFARIN - PHARMACIST DOSING INPATIENT: Freq: Every day | Status: DC

## 2014-03-26 MED ORDER — HEPARIN (PORCINE) IN NACL 100-0.45 UNIT/ML-% IJ SOLN
1550.0000 [IU]/h | INTRAMUSCULAR | Status: AC
  Administered 2014-03-27: 900 [IU]/h via INTRAVENOUS
  Administered 2014-03-28: 1400 [IU]/h via INTRAVENOUS
  Filled 2014-03-26 (×4): qty 250

## 2014-03-26 MED ORDER — ALBUTEROL SULFATE (2.5 MG/3ML) 0.083% IN NEBU: 5.0000 mg | INHALATION_SOLUTION | Freq: Four times a day (QID) | RESPIRATORY_TRACT | Status: DC | PRN

## 2014-03-26 NOTE — Progress Notes (Signed)
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 Stroke     assoc with short term memory loss and right peripheral vision loss; age 70   . Blood transfusion   . Anxiety   . Coronary artery disease     Apical LAD infarction '00; NSTEMI s/p BMS to prox LAD '09; Cath 12/2011 single vessel CAD w/ patent LAD stent w/ stable mild ISR, nl LV systolic fxn  . Anemia   . Antithrombin III deficiency     ?pt not sure if true diagnosis  . Tobacco abuse   . High blood pressure   . Myocardial infarction    Assessment: 44 y/o F on warfarin PTA for AT-III deficiency, has remote history of stroke, MI x 2. Pt here with CP, N/V. Pt hasn't been taking warfarin for a few days now and INR is 0.98 upon admission. Hgb 9.9. Other labs as above.   Goal of Therapy:  INR 2-3 Monitor platelets by anticoagulation protocol: Yes   Plan:  -Warfarin 7.5 mg PO x 1 NOW -Heparin 3000 units BOLUS -Start heparin drip at 900 units/hr -0800 HL -Daily CBC/HL -Daily PT/INR -Monitor for bleeding   Abran Duke 03/26/2014,11:47 PM

## 2014-03-26 NOTE — ED Notes (Addendum)
Pt presents to ED via EMS with c/o center chest pain, describes as squeezing pain, sudden onset today. Pt also reports legs pain since she was here the last time. Per EMS, EKG unremarkable, BP-156/80, HR-112 sinus tach., O2-99% room air. Pt alerts and oriented x4 at this time, airway intact. EMS given 324mg  ASA, sublingual nitro x2, 4mg  zofran.

## 2014-03-26 NOTE — H&P (Signed)
Date: 03/27/2014               Patient Name:  Lindsey [name] MRN: [id number]  DOB: 1970-03-27 Age / Sex: 44 y.o., female   PCP: Bethena Roys, MD         Medical Service: Internal Medicine Teaching Service         Attending Physician: Dr. Annia Belt, MD    First Contact: Dr. Osa Craver Pager: [phone]  Second Contact: Dr. Natasha Bence Pager: [phone]       After Hours (After 5p/  First Contact Pager: [phone]  weekends / holidays): Second Contact Pager: [phone]   Chief Complaint: chest pain  History of Present Illness: Lindsey [name] is a 44 year old woman with CAD s/p MI x 2 (2000, 2009 s/p BMS), mural thrombus with alleged diagnosis of antithrombin III deficiency on warfarin, CVA 2010 presents with chest pain. She was in her usual state of health until about a month ago. She describes it as chest squeezing on the midsternum that was non-radiating. It lasts a couple of minutes and then eases up. Nitroglycerin in ED made it worse. Nothing else, including rest or deep breathes, makes it better or worse. She had some associated SOB, diaphoresis and clear emesis. She thinks this is different than when she has had MIs in the past as it is less sharp. Denies cough, trauma, fevers, chills, night sweats, abdominal pain, diarrhea, constipation.   She also notes R>L bilateral leg pain for one month. Se describes it as dull aching pain. The R leg is from the heal all the way to buttock but the L is only from the hamstring to the buttock. It is worse on the backside of the legs. She said since coming to the hospital it has become numb. It is impairing her ambulation. She denies paresthesia. She has not been taking her warfarin for 3 days as the leg pain was too bad to go to pharmacy to pick it up. She was recently started on lipitor but has only taken it twice and she had the pain before this.  She recently was admitted from 12/6-9 for TIA work-up which revealed CTA head/neck negative,  TTE that did not note apical thrombus.  She was in the ED 03/15/14 for similar leg and chest pain. Work-up at that time had chest CTA negative for DVT. She was set up for outpatient LE Doppler which has not been completed.  In the ED, she received morphine 4 mg iv once.  Meds: Current Facility-Administered Medications  Medication Dose Route Frequency Provider Last Rate Last Dose  . acetaminophen (TYLENOL) tablet 650 mg  650 mg Oral Q6H PRN Marjan Rabbani, MD      . albuterol (PROVENTIL) (2.5 MG/3ML) 0.083% nebulizer solution 5 mg  5 mg Nebulization Q6H PRN Marjan Rabbani, MD      . heparin ADULT infusion 100 units/mL (25000 units/250 mL)  900 Units/hr Intravenous Continuous Narda Bonds, RPH 9 mL/hr at 03/27/14 0045 900 Units/hr at 03/27/14 0045  . magnesium sulfate IVPB 2 g 50 mL  2 g Intravenous Once Marjan Rabbani, MD      . metoprolol succinate (TOPROL-XL) 24 hr tablet 25 mg  25 mg Oral Daily Marjan Rabbani, MD      . morphine 2 MG/ML injection 1 mg  1 mg Intravenous Q4H PRN Juluis Mire, MD   1 mg at 03/26/14 2213  . pantoprazole (PROTONIX) EC tablet 40 mg  40 mg Oral Daily  Juluis Mire, MD   40 mg at 03/26/14 2208  . sodium chloride 0.9 % injection 3 mL  3 mL Intravenous Q12H Juluis Mire, MD      . Warfarin - Pharmacist Dosing Inpatient   Does not apply [id number], [facility]       Current Outpatient Prescriptions  Medication Sig Dispense Refill  . acetaminophen (TYLENOL) 325 MG tablet Take 2 tablets (650 mg total) by mouth every 6 (six) hours as needed for moderate pain, fever or headache. 30 tablet 0  . atorvastatin (LIPITOR) 40 MG tablet Take 2 tablets (80 mg total) by mouth daily. (Patient taking differently: Take 80 mg by mouth at bedtime. ) 90 tablet 11  . ibuprofen (ADVIL,MOTRIN) 600 MG tablet Take 1 tablet (600 mg total) by mouth every 6 (six) hours as needed for headache. 30 tablet 0  . metoprolol succinate (TOPROL XL) 25 MG 24 hr tablet Take 1 tablet (25 mg total) by  mouth daily. 30 tablet 12  . nitroGLYCERIN (NITROSTAT) 0.4 MG SL tablet Place 1 tablet (0.4 mg total) under the tongue every 5 (five) minutes as needed for chest pain (up to 3 doses). 25 tablet 3  . warfarin (COUMADIN) 5 MG tablet Take 1.5 tablets (7.5 mg total) by mouth daily. 42 tablet 0    Allergies: Allergies as of 03/26/2014  . (No Known Allergies)   Past Medical History  Diagnosis Date  . Stroke     assoc with short term memory loss and right peripheral vision loss; age 58   . Blood transfusion   . Anxiety   . Coronary artery disease     Apical LAD infarction '00; NSTEMI s/p BMS to prox LAD '09; Cath 12/2011 single vessel CAD w/ patent LAD stent w/ stable mild ISR, nl LV systolic fxn  . Anemia   . Antithrombin III deficiency     ?pt not sure if true diagnosis  . Tobacco abuse   . High blood pressure   . Myocardial infarction    Past Surgical History  Procedure Laterality Date  . Tubal ligation    . Cardiac catheterization    . Esophagogastroduodenoscopy N/A 06/09/2012    Procedure: ESOPHAGOGASTRODUODENOSCOPY (EGD);  Surgeon: Beryle Beams, MD;  Location: [facility] ENDOSCOPY;  Service: Endoscopy;  Laterality: N/A;  . Coronary angioplasty    . Esophagogastroduodenoscopy N/A 08/02/2013    Procedure: ESOPHAGOGASTRODUODENOSCOPY (EGD);  Surgeon: Ladene Artist, MD;  Location: [facility] ENDOSCOPY;  Service: Endoscopy;  Laterality: N/A;  . Left heart catheterization with coronary angiogram N/A 12/24/2011    Procedure: LEFT HEART CATHETERIZATION WITH CORONARY ANGIOGRAM;  Surgeon: Burnell Blanks, MD;  Location: [facility] CATH LAB;  Service: Cardiovascular;  Laterality: N/A;   Family History  Problem Relation Age of Onset  . Heart disease Brother     arrhythmia; died  . Breast cancer Maternal Aunt    History   Social History  . Marital Status: Divorced    Spouse Name: N/A    Number of Children: 2  . Years of Education: N/A   Occupational History  . Not on file.   Social History Main  Topics  . Smoking status: Current Every Day Smoker -- 0.20 packs/day for 1 years    Types: Cigarettes  . Smokeless tobacco: Never Used  . Alcohol Use: 0.0 oz/week    0 Cans of beer per week     Comment: occasion  . Drug Use: Yes    Special: Cocaine     Comment:  hx of use x 1  . Sexual Activity: No   Other Topics Concern  . Not on file   Social History Narrative    Review of Systems: A comprehensive review of systems was negative except for: as noted above per HPI  Physical Exam: Blood pressure 162/98, pulse 64, temperature 98.2 F (36.8 C), temperature source Oral, resp. rate 21, last menstrual period 03/15/2014, SpO2 100 %.  Gen: A&O x 4, no acute distress, well developed, well nourished HEENT: Atraumatic, PERRL, EOMI, sclerae anicteric, moist mucous membranes Heart: Regular rate and rhythm, normal S1 S2, no murmurs, rubs, or gallops Lungs: Clear to auscultation bilaterally, respirations unlabored Abd: Soft, non-tender, non-distended, + bowel sounds, no hepatosplenomegaly Ext: No edema, cyanosis, warmth, or swelling. Positive b/l leg raises. Neuro: A&O x 4, CNII-XII intact, intention tremor but coordination intact, b/l UE 5/5 strength and sensation intact, LLE 5/5 strength, RLE 4/5 strength but question of effort, sensation intact throughout LE. Pain on hard palpation but not soft touch diffusely in both LE. No Babinksi signs, no pronator drift.  Lab results: Basic Metabolic Panel:  Recent Labs  03/26/14 1712 03/26/14 2202  NA 140 143  K 4.4 3.7  CL 109 107  CO2 25 22  GLUCOSE 92 96  BUN 7 5*  CREATININE 0.58 0.64  CALCIUM 8.4 8.9  MG  --  1.6  PHOS  --  3.2   Liver Function Tests:  Recent Labs  03/26/14 2202  AST 44*  ALT 22  ALKPHOS 95  BILITOT 0.8  PROT 6.6  ALBUMIN 3.2*  CBC:  Recent Labs  03/26/14 1712  WBC 5.1  HGB 9.9*  HCT 29.8*  MCV 94.0  PLT 314   Cardiac Enzymes:  Recent Labs  03/26/14 1712 03/26/14 2202  CKTOTAL 162  --     TROPONINI  --  <0.03   Anemia Panel:  Recent Labs  03/26/14 2202  RETICCTPCT 1.6   Coagulation:  Recent Labs  03/26/14 1729  LABPROT 13.1  INR 0.98  PTT 27  Urine Drug Screen: Drugs of Abuse     Component Value Date/Time   LABOPIA NONE DETECTED 03/26/2014 1755   COCAINSCRNUR NONE DETECTED 03/26/2014 1755   LABBENZ NONE DETECTED 03/26/2014 1755   AMPHETMU NONE DETECTED 03/26/2014 1755   THCU NONE DETECTED 03/26/2014 1755   LABBARB NONE DETECTED 03/26/2014 1755    Alcohol Level:  Recent Labs  03/26/14 2202  ETH <5   Antithrombin III function 77%  Imaging results:  Dg Chest 2 View  03/26/2014   CLINICAL DATA:  Centralized chest pain.  EXAM: CHEST  2 VIEW  COMPARISON:  Radiographs and CT 03/15/2014  FINDINGS: The cardiomediastinal contours are normal. Cardiac stent is seen. The lungs are clear. Pulmonary vasculature is normal. No consolidation, pleural effusion, or pneumothorax. No acute osseous abnormalities are seen.  IMPRESSION: No acute pulmonary process.   Electronically Signed   By: Jeb Levering M.D.   On: 03/26/2014 19:05    Other results: EKG: NSR, t-wave inversion in V2, V3 new from prior 03/15/14   Assessment & Plan by Problem: Active Problems:   Chest pain  #B/l LE pain: The etiology of Lindsey [name]'s LE pain is unclear. She has bilateral positive leg raises which is concerning for radiculopathy. Her legs do not have any warmth or swelling and the pain is more of a tenderness to superficial pressure anywhere along her leg which makes DVT seem unlikely. She was previously seen in the ED for these  symptoms with negative CTA. Brain MR on previous admission was negative except for remote small L posterior cerebral artery territory infarct ruling out stroke. There is question whether she actually has antithrombin III deficiency. Previous coagulation work-up 01/2010 is notable for Antithrombin III activity 132% (elevated), Protein C activity 73% (normal), lupus  anticoagulant 35 (wnl), protein S 97 (wnl). She was supposed to be anticoagulated on warfarin but she has not taken it in three days and INR is subtherapeutic at presentation. It is unclear if she needs warfarin as her last echo did not note mural thrombus but this was TTE and not TEE. Also Dr Stanford Breed of cardiology saw her 12/18 and felt given proprensity to form thrombi she should continue warfarin. We will check anticoagulation panel before heparin administration. Will hold lipitor for now although CK was normal. She has neurology outpatient appointment scheduled for 03/29/14 w Dr Rosalin Hawking. -morphine 1 mg iv q4hprn -MR l spine wo contrast -b/l LE venous duplex doppler -f/u protein c activity and total, ANA, ESR, vitamin b12, RPR -heparin per pharmacy -warfarin per pharmacy -hold lipitor -PT/INR daily -PT/OT  #Chest Pain: Lindsey [name] has a high risk cardiac history as she has CAD s/p apical LAD MI 2000, NSTEMI s/p BMS prox LAD 2009. This pain does not sound like ACS as it is not anginal and has been occurring for months. Her first troponin is negative although there appear to be new t-wave inversions in leads V2, V3 on EKG. CXR is negative. GI such as reflux should be considered. At home she is supposed to be on lipitor 80 mg po daily, metoprolol 25 mg daily, and warfarin 7.5 mg daily. -heparin per pharmacy -warfarin per pharmacy -morphine 1 mg iv q4hprn -protonix 40 mg po daily -cont home metoprolol 25 mg daily -hold lipitor -consider ACEi -cycle troponin x 3 -EKG in am -cardiac monitoring  #Antithrombin III Deficiency: Her workup was in [place] many years ago and when she moved to [place] she saw Dr Robet Leu of hematology who thinks this may not be an accurate diagnosis and may have protein c deficiency. Previous coagulation work-up 01/2010 is notable for Antithrombin III activity 132% (elevated), Protein C activity 73% (normal), lupus anticoagulant 35 (wnl), protein S 97 (wn). Current  antithrombin III function within normal limits. She was on warfarin for years but it was stopped (she is not sure why) until being restarted this past year when diagnosed with mural thrombus. She has not taken warfarin in 3 days as described in HPI above. Dr Stanford Breed feels that although she did not have thrombus on recent TTE, she should continue on warfarin given propensity for clots. We drew anticoagulation panel prior to heparin administration. -heparin per pharmacy -warfarin per pharmacy  #CAD s/p apical LAD MI 2000, NSTEMI s/p BMS prox LAD 2009: See chest pain above. She is followed by Dr Stanford Breed of cardiology. -heparin per pharmacy -warfarin per pharmacy -morphine 1 mg iv q4hprn -cont home metoprolol 25 mg daily -hold lipitor for now in setting of leg pain -consider ACEi -cycle troponin x 3 -EKG in am -cardiac monitoring  #CVA: Lindsey [name] had a brain MR on 12/7 for these symptoms which only noted the remote small left posterior cerebral artery territory infarct. This is a stable issue (see above).  #Anemia: Lindsey [name]'s presenting hemoglobin is 9.9 and was 11 twelve days ago. Her baseline appears to be ~ 11 for the past year although three years ago she had hemoglobin ~ 13. Last anemia panel 06/2011  with iron 80, TIBC 290, ferritin 56. -f/u vitamin b12, folate, iron and TIBC, ferritin, LDH, FOBC  #Elevated Transaminase: AST minimally elevated at 44 with rest of liver function panel normal. No abdominal symptoms. -hepatitis panel  #Apical Mural Thrombus: Lindsey. [name] had TTE 08/02/2013 when admitted for pancreatitis which demonstrated 1.0 cm x 0.5 cm apical mural thrombus. Recent TTE 02/22/14 had no thrombus. See above.  #HTN: BP in the ED 130s-160s/60s-110s. On metoprolol 25 mg po daily at home.  -cont metoprolol 25 mg po daily  #HL: Last lipid profile 02/20/14 with cholesterol 200, triglycerides 189, HDL 82, LDL 80. On atorvastatin 80 mg po qhs at home. -hold atorvastatin 80 mg po qhs as  detailed above  #Diet: heart  #DVT PPx: heparin ggt  #Code: full  Dispo: Disposition is deferred at this time, awaiting improvement of current medical problems. Anticipated discharge in approximately 1-2 day(s).   The patient does have a current PCP (Ejiroghene Arlyce Dice, MD) and does need an [facility] hospital follow-up appointment after discharge.  The patient does not know have transportation limitations that hinder transportation to clinic appointments.  Signed: Kelby Aline, MD 03/27/2014, 1:39 AM

## 2014-03-26 NOTE — ED Provider Notes (Signed)
CSN: [id number]     Arrival date & time 03/26/14  1640 History   First MD Initiated Contact with Patient 03/26/14 1713     Chief Complaint  Patient presents with  . Chest Pain     (Consider location/radiation/quality/duration/timing/severity/associated sxs/prior Treatment) Patient is a 44 y.o. female presenting with chest pain. The history is provided by the patient.  Chest Pain Associated symptoms: no abdominal pain, no back pain, no headache, no nausea, no numbness, no shortness of breath, not vomiting and no weakness    patient presents with a chest tightness that began earlier today. It is dull. It is still there. She has had 2 previous MIs but states that those she also had sweating and diaphoresis. She states she does not feel that now. Otherwise his pain may be somewhat like her previous chest pain. She also has had 2 weeks of pain in the back of her legs. States is getting so severe she is having difficulty walking. No trauma. No swelling. No back pain. She states she does not have a history of pain in her legs. She has recently been started on Lipitor. Patient states that she's been feeling bad for last 2 days and stopped taking her Coumadin. Past Medical History  Diagnosis Date  . Stroke     assoc with short term memory loss and right peripheral vision loss; age 25   . Blood transfusion   . Anxiety   . Coronary artery disease     Apical LAD infarction '00; NSTEMI s/p BMS to prox LAD '09; Cath 12/2011 single vessel CAD w/ patent LAD stent w/ stable mild ISR, nl LV systolic fxn  . Anemia   . Antithrombin III deficiency     ?pt not sure if true diagnosis  . Tobacco abuse   . High blood pressure   . Myocardial infarction    Past Surgical History  Procedure Laterality Date  . Tubal ligation    . Cardiac catheterization    . Esophagogastroduodenoscopy N/A 06/09/2012    Procedure: ESOPHAGOGASTRODUODENOSCOPY (EGD);  Surgeon: Theda Belfast, MD;  Location: [facility] ENDOSCOPY;  Service:  Endoscopy;  Laterality: N/A;  . Coronary angioplasty    . Esophagogastroduodenoscopy N/A 08/02/2013    Procedure: ESOPHAGOGASTRODUODENOSCOPY (EGD);  Surgeon: Meryl Dare, MD;  Location[facility] ENDOSCOPY;  Service: Endoscopy;  Laterality: N/A;  . Left heart catheterization with coronary angiogram N/A 12/24/2011    Procedure: LEFT HEART CATHETERIZATION WITH CORONARY ANGIOGRAM;  Surgeon: Kathleene Hazel, MD;  Location: [facility] CATH LAB;  Service: Cardiovascular;  Laterality: N/A;   Family History  Problem Relation Age of Onset  . Heart disease Brother     arrhythmia; died  . Breast cancer Maternal Aunt    History  Substance Use Topics  . Smoking status: Current Every Day Smoker -- 0.20 packs/day for 1 years    Types: Cigarettes  . Smokeless tobacco: Never Used  . Alcohol Use: 0.0 oz/week    0 Cans of beer per week     Comment: occasion   OB History    No data available     Review of Systems  Constitutional: Negative for activity change and appetite change.  Eyes: Negative for pain.  Respiratory: Negative for chest tightness and shortness of breath.   Cardiovascular: Positive for chest pain. Negative for leg swelling.  Gastrointestinal: Negative for nausea, vomiting, abdominal pain and diarrhea.  Genitourinary: Negative for flank pain.  Musculoskeletal: Negative for back pain and neck stiffness.  Lower extremity pain  Skin: Negative for rash.  Neurological: Negative for weakness, numbness and headaches.  Psychiatric/Behavioral: Negative for behavioral problems.      Allergies  Review of patient's allergies indicates no known allergies.  Home Medications   Prior to Admission medications   Medication Sig Start Date End Date Taking? Authorizing Provider  acetaminophen (TYLENOL) 325 MG tablet Take 2 tablets (650 mg total) by mouth every 6 (six) hours as needed for moderate pain, fever or headache. 02/21/14  Yes Tasrif Ahmed, MD  atorvastatin (LIPITOR) 40 MG tablet Take 2  tablets (80 mg total) by mouth daily. Patient taking differently: Take 80 mg by mouth at bedtime.  02/21/14 02/21/15 Yes Tasrif Ahmed, MD  ibuprofen (ADVIL,MOTRIN) 600 MG tablet Take 1 tablet (600 mg total) by mouth every 6 (six) hours as needed for headache. 02/21/14  Yes Tasrif Ahmed, MD  metoprolol succinate (TOPROL XL) 25 MG 24 hr tablet Take 1 tablet (25 mg total) by mouth daily. 03/03/14  Yes Lewayne Bunting, MD  nitroGLYCERIN (NITROSTAT) 0.4 MG SL tablet Place 1 tablet (0.4 mg total) under the tongue every 5 (five) minutes as needed for chest pain (up to 3 doses). 10/14/13  Yes Beatrice Lecher, PA-C  warfarin (COUMADIN) 5 MG tablet Take 1.5 tablets (7.5 mg total) by mouth daily. 10/19/13  Yes Ejiroghene E Emokpae, MD  oxyCODONE-acetaminophen (PERCOCET) 5-325 MG per tablet Take 1 tablet by mouth every 6 (six) hours as needed. Patient not taking: Reported on 03/26/2014 03/15/14   Toy Cookey, MD   BP 140/84 mmHg  Pulse 77  Temp(Src) 98.2 F (36.8 C) (Oral)  Resp 23  SpO2 100%  LMP 03/15/2014 Physical Exam  Constitutional: She appears well-developed and well-nourished.  HENT:  Head: Normocephalic.  Eyes: Pupils are equal, round, and reactive to light.  Cardiovascular: Normal rate and regular rhythm.   Pulmonary/Chest: Effort normal.  Abdominal: Soft. There is no tenderness.  Musculoskeletal: She exhibits tenderness.  Pain to both lower extremities, worse on the right. Pain with straight leg raise on the right. Pain with palpation in caps bilaterally. No edema. Strong dorsalis pedis pulse bilaterally. No lumbar tenderness. No rash.    ED Course  Procedures (including critical care time) Labs Review Labs Reviewed  CBC - Abnormal; Notable for the following:    RBC 3.17 (*)    Hemoglobin 9.9 (*)    HCT 29.8 (*)    RDW 17.7 (*)    All other components within normal limits  BASIC METABOLIC PANEL  PREGNANCY, URINE  PROTIME-INR  CK  I-STAT TROPOININ, ED    Imaging Review Dg  Chest 2 View  03/26/2014   CLINICAL DATA:  Centralized chest pain.  EXAM: CHEST  2 VIEW  COMPARISON:  Radiographs and CT 03/15/2014  FINDINGS: The cardiomediastinal contours are normal. Cardiac stent is seen. The lungs are clear. Pulmonary vasculature is normal. No consolidation, pleural effusion, or pneumothorax. No acute osseous abnormalities are seen.  IMPRESSION: No acute pulmonary process.   Electronically Signed   By: Rubye Oaks M.D.   On: 03/26/2014 19:05     EKG Interpretation   Date/Time:  Sunday March 26 2014 16:48:36 EST Ventricular Rate:  76 PR Interval:  129 QRS Duration: 79 QT Interval:  380 QTC Calculation: 427 R Axis:   57 Text Interpretation:  Sinus rhythm Anterior infarct, old Baseline wander  in lead(s) I II III aVR aVL aVF V1 V2 V5 V6 Confirmed by Simi Briel  MD,  Avrum Kimball [phone]) on  03/26/2014 5:20:27 PM      MDM   Final diagnoses:  Chest pain  Pain of lower extremity, unspecified laterality    Patient with chest pain. Has had 2 previous MIs. EKG reassuring enzymes negative. Will admit to internal medicine. Also pain in the back of both of her legs. No back pain. Arterial insufficiency not likely even though she is subtherapeutic on her Coumadin since she has great pulses in both feet. May be due to recent Lipitor. Will admit to PCP.    Juliet Rude. Rubin Payor, MD 03/26/14 2026

## 2014-03-26 NOTE — ED Notes (Signed)
Spoke with Selena Batten at main lab to add CK and PT-INR.

## 2014-03-27 ENCOUNTER — Encounter (HOSPITAL_COMMUNITY): Payer: Self-pay | Admitting: General Practice

## 2014-03-27 ENCOUNTER — Observation Stay (HOSPITAL_COMMUNITY): Payer: Medicaid Other

## 2014-03-27 DIAGNOSIS — K625 Hemorrhage of anus and rectum: Secondary | ICD-10-CM | POA: Diagnosis present

## 2014-03-27 DIAGNOSIS — I2510 Atherosclerotic heart disease of native coronary artery without angina pectoris: Secondary | ICD-10-CM | POA: Diagnosis present

## 2014-03-27 DIAGNOSIS — Z7901 Long term (current) use of anticoagulants: Secondary | ICD-10-CM | POA: Diagnosis not present

## 2014-03-27 DIAGNOSIS — D649 Anemia, unspecified: Secondary | ICD-10-CM | POA: Diagnosis present

## 2014-03-27 DIAGNOSIS — Z79899 Other long term (current) drug therapy: Secondary | ICD-10-CM | POA: Diagnosis not present

## 2014-03-27 DIAGNOSIS — M5126 Other intervertebral disc displacement, lumbar region: Secondary | ICD-10-CM | POA: Diagnosis present

## 2014-03-27 DIAGNOSIS — I213 ST elevation (STEMI) myocardial infarction of unspecified site: Secondary | ICD-10-CM

## 2014-03-27 DIAGNOSIS — M79605 Pain in left leg: Secondary | ICD-10-CM

## 2014-03-27 DIAGNOSIS — M5417 Radiculopathy, lumbosacral region: Secondary | ICD-10-CM | POA: Diagnosis present

## 2014-03-27 DIAGNOSIS — I10 Essential (primary) hypertension: Secondary | ICD-10-CM | POA: Diagnosis present

## 2014-03-27 DIAGNOSIS — Z955 Presence of coronary angioplasty implant and graft: Secondary | ICD-10-CM | POA: Diagnosis not present

## 2014-03-27 DIAGNOSIS — M79606 Pain in leg, unspecified: Secondary | ICD-10-CM | POA: Diagnosis present

## 2014-03-27 DIAGNOSIS — D6859 Other primary thrombophilia: Secondary | ICD-10-CM | POA: Diagnosis present

## 2014-03-27 DIAGNOSIS — Z8249 Family history of ischemic heart disease and other diseases of the circulatory system: Secondary | ICD-10-CM | POA: Diagnosis not present

## 2014-03-27 DIAGNOSIS — R740 Nonspecific elevation of levels of transaminase and lactic acid dehydrogenase [LDH]: Secondary | ICD-10-CM

## 2014-03-27 DIAGNOSIS — K648 Other hemorrhoids: Secondary | ICD-10-CM | POA: Diagnosis present

## 2014-03-27 DIAGNOSIS — Z8241 Family history of sudden cardiac death: Secondary | ICD-10-CM | POA: Diagnosis not present

## 2014-03-27 DIAGNOSIS — Z791 Long term (current) use of non-steroidal anti-inflammatories (NSAID): Secondary | ICD-10-CM | POA: Diagnosis not present

## 2014-03-27 DIAGNOSIS — Z79891 Long term (current) use of opiate analgesic: Secondary | ICD-10-CM

## 2014-03-27 DIAGNOSIS — I252 Old myocardial infarction: Secondary | ICD-10-CM | POA: Diagnosis not present

## 2014-03-27 DIAGNOSIS — K5791 Diverticulosis of intestine, part unspecified, without perforation or abscess with bleeding: Secondary | ICD-10-CM | POA: Diagnosis present

## 2014-03-27 DIAGNOSIS — E785 Hyperlipidemia, unspecified: Secondary | ICD-10-CM | POA: Diagnosis present

## 2014-03-27 DIAGNOSIS — M79604 Pain in right leg: Secondary | ICD-10-CM

## 2014-03-27 DIAGNOSIS — M4800 Spinal stenosis, site unspecified: Secondary | ICD-10-CM | POA: Diagnosis present

## 2014-03-27 DIAGNOSIS — F17210 Nicotine dependence, cigarettes, uncomplicated: Secondary | ICD-10-CM | POA: Diagnosis present

## 2014-03-27 DIAGNOSIS — R7401 Elevation of levels of liver transaminase levels: Secondary | ICD-10-CM | POA: Diagnosis present

## 2014-03-27 DIAGNOSIS — Z8673 Personal history of transient ischemic attack (TIA), and cerebral infarction without residual deficits: Secondary | ICD-10-CM | POA: Diagnosis not present

## 2014-03-27 DIAGNOSIS — R079 Chest pain, unspecified: Secondary | ICD-10-CM | POA: Diagnosis present

## 2014-03-27 DIAGNOSIS — Z823 Family history of stroke: Secondary | ICD-10-CM | POA: Diagnosis not present

## 2014-03-27 LAB — HEPARIN LEVEL (UNFRACTIONATED)
Heparin Unfractionated: 0.14 IU/mL — ABNORMAL LOW (ref 0.30–0.70)
Heparin Unfractionated: <0.1 IU/mL — ABNORMAL LOW (ref 0.30–0.70)

## 2014-03-27 LAB — PROTIME-INR
INR: 1.12 (ref 0.00–1.49)
Prothrombin Time: 14.6 seconds (ref 11.6–15.2)

## 2014-03-27 LAB — TROPONIN I: Troponin I: <0.03 ng/mL (ref <0.031)

## 2014-03-27 LAB — POC OCCULT BLOOD, ED: FECAL OCCULT BLD: POSITIVE — AB

## 2014-03-27 LAB — HEPATITIS PANEL, ACUTE
HCV Ab: NEGATIVE
HEP A IGM: NONREACTIVE
HEP B C IGM: NONREACTIVE
Hepatitis B Surface Ag: NEGATIVE

## 2014-03-27 LAB — BASIC METABOLIC PANEL
ANION GAP: 10 (ref 5–15)
BUN: 5 mg/dL — ABNORMAL LOW (ref 6–23)
CO2: 24 mmol/L (ref 19–32)
Calcium: 8.5 mg/dL (ref 8.4–10.5)
Chloride: 104 mEq/L (ref 96–112)
Creatinine, Ser: 0.63 mg/dL (ref 0.50–1.10)
GFR calc non Af Amer: >90 mL/min (ref >90)
GLUCOSE: 95 mg/dL (ref 70–99)
Potassium: 3.3 mmol/L — ABNORMAL LOW (ref 3.5–5.1)
Sodium: 138 mmol/L (ref 135–145)

## 2014-03-27 LAB — VITAMIN B12: VITAMIN B 12: 186 pg/mL — AB (ref 211–911)

## 2014-03-27 LAB — IRON AND TIBC
IRON: 251 ug/dL — AB (ref 42–145)
Saturation Ratios: 74 % — ABNORMAL HIGH (ref 20–55)
TIBC: 337 ug/dL (ref 250–470)
UIBC: 86 ug/dL — ABNORMAL LOW (ref 125–400)

## 2014-03-27 LAB — LACTATE DEHYDROGENASE: LDH: 144 U/L (ref 94–250)

## 2014-03-27 LAB — FERRITIN: Ferritin: 54 ng/mL (ref 10–291)

## 2014-03-27 LAB — FOLATE: Folate: 3.6 ng/mL

## 2014-03-27 LAB — SEDIMENTATION RATE: Sed Rate: 15 mm/hr (ref 0–22)

## 2014-03-27 MED ORDER — POTASSIUM CHLORIDE CRYS ER 20 MEQ PO TBCR
40.0000 meq | EXTENDED_RELEASE_TABLET | Freq: Once | ORAL | Status: AC
Start: 2014-03-27 — End: 2014-03-27
  Administered 2014-03-27: 40 meq via ORAL
  Filled 2014-03-27: qty 2

## 2014-03-27 MED ORDER — METOPROLOL SUCCINATE ER 25 MG PO TB24
25.0000 mg | ORAL_TABLET | Freq: Every day | ORAL | Status: DC
  Administered 2014-03-27 – 2014-03-30 (×4): 25 mg via ORAL
  Filled 2014-03-27 (×4): qty 1

## 2014-03-27 MED ORDER — MAGNESIUM SULFATE 2 GM/50ML IV SOLN
2.0000 g | Freq: Once | INTRAVENOUS | Status: AC
  Administered 2014-03-27: 2 g via INTRAVENOUS
  Filled 2014-03-27: qty 50

## 2014-03-27 MED ORDER — NAPROXEN 500 MG PO TABS
500.0000 mg | ORAL_TABLET | Freq: Two times a day (BID) | ORAL | Status: DC
  Filled 2014-03-27 (×2): qty 1

## 2014-03-27 MED ORDER — HYDROMORPHONE HCL 1 MG/ML IJ SOLN
1.0000 mg | INTRAMUSCULAR | Status: DC | PRN
  Administered 2014-03-27 – 2014-03-30 (×18): 1 mg via INTRAVENOUS
  Filled 2014-03-27 (×18): qty 1

## 2014-03-27 MED ORDER — ATORVASTATIN CALCIUM 80 MG PO TABS
80.0000 mg | ORAL_TABLET | Freq: Every day | ORAL | Status: DC
  Administered 2014-03-27 – 2014-03-30 (×4): 80 mg via ORAL
  Filled 2014-03-27 (×4): qty 1

## 2014-03-27 MED ORDER — ACETAMINOPHEN 325 MG PO TABS
650.0000 mg | ORAL_TABLET | Freq: Four times a day (QID) | ORAL | Status: DC | PRN
  Administered 2014-03-28 – 2014-03-30 (×4): 650 mg via ORAL
  Filled 2014-03-27 (×3): qty 2

## 2014-03-27 MED ORDER — METHYLPREDNISOLONE SODIUM SUCC 125 MG IJ SOLR
80.0000 mg | Freq: Once | INTRAMUSCULAR | Status: AC
  Administered 2014-03-27: 80 mg via INTRAVENOUS
  Filled 2014-03-27: qty 2

## 2014-03-27 MED ORDER — SODIUM CHLORIDE 0.9 % IJ SOLN
3.0000 mL | Freq: Two times a day (BID) | INTRAMUSCULAR | Status: DC
  Administered 2014-03-27 – 2014-03-30 (×5): 3 mL via INTRAVENOUS

## 2014-03-27 MED ORDER — ATORVASTATIN CALCIUM 80 MG PO TABS
80.0000 mg | ORAL_TABLET | Freq: Every day | ORAL | Status: DC
  Filled 2014-03-27: qty 1

## 2014-03-27 MED ORDER — PREDNISONE 10 MG PO TABS
60.0000 mg | ORAL_TABLET | Freq: Every day | ORAL | Status: DC
  Administered 2014-03-28 – 2014-03-30 (×2): 60 mg via ORAL
  Filled 2014-03-27 (×4): qty 1

## 2014-03-27 MED ORDER — PREDNISONE 50 MG PO TABS: 50.0000 mg | ORAL_TABLET | Freq: Every day | ORAL | Status: DC

## 2014-03-27 MED ORDER — LORAZEPAM 2 MG/ML IJ SOLN
0.5000 mg | Freq: Once | INTRAMUSCULAR | Status: AC
Start: 2014-03-27 — End: 2014-03-27
  Administered 2014-03-27: 0.5 mg via INTRAVENOUS
  Filled 2014-03-27: qty 1

## 2014-03-27 NOTE — Progress Notes (Signed)
Report received from ED at 1400 and pt arrived to the unit via bed at 1515. Pt A&O x4; IV intact with heparin infusing; vitals taken; pain assessed; telemetry applied and called in to CCMD; MD paged and notified of pt arrival to the unit; pt oriented to the room and unit; voided independently upon arrival to the room; pt laying in bed with call light within reach. Will continue to monitor quietly. Arabella Merles Dashiel Bergquist RN.

## 2014-03-27 NOTE — Progress Notes (Signed)
Subjective:  Patient was seen and examined this morning. She continues to have bilateral lower extremity pain and weakness secondary to pain. Pain is diffuse in legs and within the muscle. Numbness and tingling radiate posteriorly from foot to buttock on right and posteriorly from knee to buttock on left. She denies any back pain. Patient denies any recent unusual activity or heavy lifting. She has never had these symptoms before.   Patient also complains of intermittent substernal non-radiating chest pain associated with nausea and vomiting. It does not feel similar in nature to her prior MIs. Patient states chest pain has improved since admission, but nausea and vomiting have persisted.   Patient was diagnosed with antithrombin III deficiency after her first MI at age 85. Patient was placed on warfarin. Patient has 2 children and has had 2 pregnancies. Patient's brother died of an arrhythmias at age 84. Patient's mother has a history of blood clots and father has a history of CVAs.   Objective: Vital signs in last 24 hours: Filed Vitals:   03/27/14 1030 03/27/14 1300 03/27/14 1330 03/27/14 1517  BP: 152/95 127/85 136/87 148/82  Pulse: 70 65 70 65  Temp:    98.2 F (36.8 C)  TempSrc:      Resp: Height:     (1.676 m)  Weight:    70.58 kg (155 lb 9.6 oz)  SpO2: 96% 97% 99% 98%   Weight change:   Intake/Output Summary (Last 24 hours) at 03/27/14 1730 Last data filed at 03/27/14 0629  Gross per 24 hour  Intake     53 ml  Output      0 ml  Net     53 ml   General: Vital signs reviewed.  Patient is well-developed and well-nourished, in no acute distress and cooperative with exam.  Cardiovascular: RRR, S1 normal, S2 normal, no murmurs, gallops, or rubs. Pulmonary/Chest: Clear to auscultation bilaterally, no wheezes, rales, or rhonchi Abdominal: Soft, non-tender, non-distended, BS + Extremities: No lower extremity edema bilaterally, no increased warmth. Diffuse  tenderness with light touch of lower extremities.   Pulses symmetric and intact bilaterally. Strength is 5/5 in left lower extremity and 4/5 with plantar flexion and hip extension, 3/5 with dorsiflexion and hip flexion. Strength is limited by pain. Skin: Warm, dry and intact. No rashes or erythema. Psychiatric: Normal mood and affect. speech and behavior is normal. Cognition and memory are normal.   Lab Results: Basic Metabolic Panel:  Recent Labs Lab 03/26/14 2202 03/27/14 0156  NA 143 138  K 3.7 3.3*  CL 107 104  CO2 22 24  GLUCOSE 96 95  BUN 5* 5*  CREATININE 0.64 0.63  CALCIUM 8.9 8.5  MG 1.6  --   PHOS 3.2  --    Liver Function Tests:  Recent Labs Lab 03/26/14 2202  AST 44*  ALT 22  ALKPHOS 95  BILITOT 0.8  PROT 6.6  ALBUMIN 3.2*   CBC:  Recent Labs Lab 03/26/14 1712  WBC 5.1  HGB 9.9*  HCT 29.8*  MCV 94.0  PLT 314   Cardiac Enzymes:  Recent Labs Lab 03/26/14 1712 03/26/14 2202 03/27/14 0155 03/27/14 1002  CKTOTAL 162  --   --   --   TROPONINI  --  <0.03 <0.03 <0.03   Coagulation:  Recent Labs Lab 03/26/14 1729 03/27/14 0156  LABPROT 13.1 14.6  INR 0.98 1.12   Anemia Panel:  Recent Labs Lab 03/26/14 2202  VITAMINB12 186*  FOLATE 3.6  FERRITIN 54  TIBC 337  IRON 251*  RETICCTPCT 1.6   Urine Drug Screen: Drugs of Abuse     Component Value Date/Time   LABOPIA NONE DETECTED 03/26/2014 1755   COCAINSCRNUR NONE DETECTED 03/26/2014 1755   LABBENZ NONE DETECTED 03/26/2014 1755   AMPHETMU NONE DETECTED 03/26/2014 1755   THCU NONE DETECTED 03/26/2014 1755   LABBARB NONE DETECTED 03/26/2014 1755    Alcohol Level:  Recent Labs Lab 03/26/14 2202  ETH <5   Micro Results: Recent Results (from the past 240 hour(s))  MRSA PCR Screening     Status: None   Collection Time: 03/26/14 10:23 PM  Result Value Ref Range Status   MRSA by PCR NEGATIVE NEGATIVE Final    Comment:        The GeneXpert MRSA Assay (FDA approved for NASAL  specimens only), is one component of a comprehensive MRSA colonization surveillance program. It is not intended to diagnose MRSA infection nor to guide or monitor treatment for MRSA infections.    Studies/Results: Dg Chest 2 View  03/26/2014   CLINICAL DATA:  Centralized chest pain.  EXAM: CHEST  2 VIEW  COMPARISON:  Radiographs and CT 03/15/2014  FINDINGS: The cardiomediastinal contours are normal. Cardiac stent is seen. The lungs are clear. Pulmonary vasculature is normal. No consolidation, pleural effusion, or pneumothorax. No acute osseous abnormalities are seen.  IMPRESSION: No acute pulmonary process.   Electronically Signed   By: Rubye Oaks M.D.   On: 03/26/2014 19:05   Mr Lumbar Spine Wo Contrast  03/27/2014   CLINICAL DATA:  Back pain for 2 weeks, increasing in severity, difficulty ambulating.  EXAM: MRI LUMBAR SPINE WITHOUT CONTRAST  TECHNIQUE: Multiplanar, multisequence MR imaging of the lumbar spine was performed. No intravenous contrast was administered.  COMPARISON:  CT of the abdomen and pelvis October 30, 2013  FINDINGS: Lumbar vertebral bodies and posterior elements are intact and aligned with maintenance of lumbar lordosis. Using the reference level of the last well-formed intervertebral disc as L5-S1, mild L4-5 and L5-S1 disc height loss, is slight desiccation these 2 discs. Minimal acute discogenic endplate changes superior posterior S1.  Conus medullaris terminates at L1-2 and appears normal morphology and signal characteristics. Cauda equina is unremarkable. The RIGHT psoas muscle is slightly smaller than the LEFT, recommend correlation with ambulation.  Level by level evaluation:  L1-2, L2-3: No disc bulge, canal stenosis nor neural foraminal narrowing.  L3-4: 2 mm broad-based disc bulge asymmetric to the RIGHT. Mild facet arthropathy and ligamentum flavum redundancy. No canal stenosis. Mild to moderate RIGHT, mild LEFT neural foraminal narrowing.  L4-5: 2-3 mm broad-based  disc bulge, mild facet arthropathy and ligamentum flavum redundancy without canal stenosis. Moderate LEFT greater than RIGHT neural foraminal narrowing.  L5-S1: LEFT central approximate 7 x 4 mm (transverse by AP) disc extrusion with 7 mm of caudal subligamentous contiguous extent, contacting the traversing LEFT S1 nerve within the lateral recess. Intermediate to bright STIR signal within the extruded material suggest acuity. Mild canal stenosis. Mild bilateral neural foraminal narrowing.  IMPRESSION: Approximate 7 x 4 x 7 mm LEFT central L5-S1 acute appearing contiguous disc extrusion contacting the traversing LEFT S1 nerve, resulting mild canal stenosis.  No acute fracture nor malalignment.  L4-5 and L5-S1 neural foraminal narrowing: Moderate at L4-5.   Electronically Signed   By: Awilda Metro   On: 03/27/2014 03:52   Medications: I have reviewed the patient's current medications. Prior to Admission:  Prescriptions prior to  admission  Medication Sig Dispense Refill Last Dose  . acetaminophen (TYLENOL) 325 MG tablet Take 2 tablets (650 mg total) by mouth every 6 (six) hours as needed for moderate pain, fever or headache. 30 tablet 0 2 weeks  . atorvastatin (LIPITOR) 40 MG tablet Take 2 tablets (80 mg total) by mouth daily. (Patient taking differently: Take 80 mg by mouth at bedtime. ) 90 tablet 11 03/25/2014 at Unknown time  . ibuprofen (ADVIL,MOTRIN) 600 MG tablet Take 1 tablet (600 mg total) by mouth every 6 (six) hours as needed for headache. 30 tablet 0 unknown  . metoprolol succinate (TOPROL XL) 25 MG 24 hr tablet Take 1 tablet (25 mg total) by mouth daily. 30 tablet 12 03/26/2014 at 0900  . nitroGLYCERIN (NITROSTAT) 0.4 MG SL tablet Place 1 tablet (0.4 mg total) under the tongue every 5 (five) minutes as needed for chest pain (up to 3 doses). 25 tablet 3 unknown  . warfarin (COUMADIN) 5 MG tablet Take 1.5 tablets (7.5 mg total) by mouth daily. 42 tablet 0 03/23/2014   Scheduled Meds: .  atorvastatin  80 mg Oral q1800  . metoprolol succinate  25 mg Oral Daily  . pantoprazole  40 mg Oral Daily  . [START ON 03/28/2014] predniSONE  60 mg Oral Q breakfast  . sodium chloride  3 mL Intravenous Q12H   Continuous Infusions: . heparin 1,050 Units/hr (03/27/14 1142)   PRN Meds:.acetaminophen, albuterol, HYDROmorphone (DILAUDID) injection Assessment/Plan: Principal Problem:   Chest pain Active Problems:   Hyperlipemia   Essential hypertension   Coronary Artery Disease   Paresthesias   Antithrombin III deficiency   Apical mural thrombus   Long term current use of anticoagulant therapy   History of ischemic left PCA stroke   Bilateral lower extremity pain   Elevated AST (SGOT)   Radiculopathy of lumbosacral region   Lumbar herniated disc  Radiculopathy: Bilateral positive leg raises which is concerning for radiculopathy. MRI Lumbar Spine showed an approximate 7 x 4 x 7 mm LEFT central L5-S1 acute appearing contiguous disc extrusion contacting the traversing LEFT S1 nerve, resulting mild canal stenosis. No acute fracture nor malalignment. L4-5 and L5-S1 neural foraminal narrowing: Moderate at L4-5. These results were discussed with radiology who stated inflammation is the cause for symptoms greater on right than left. She has neurology outpatient appointment scheduled for 03/29/14 w Dr Marvel Plan. Patient has no symptoms of cauda equina. Conservative management with NSAIDs or acetaminophen is recommended in disc herniations with no or mild motor deficit. Symptoms should be re-evaluated at 2 week interval to assess response. If disc herniation or foraminal stenosis causes progressive or severe motor deficit surgery with discectomy or formaminal decompression is indicated.  -PT/OT -Morphine 1 mg IV Q4H prn -Prednisone 60 mg daily for 5-7 days -No NSAIDs in setting of gastritis  Chest Pain in Setting of CAD and H/o MI and NSTEMI: Patient has a high risk cardiac history as she has CAD  s/p apical LAD MI 2000, NSTEMI s/p BMS prox LAD 2009. This pain does not sound like ACS as it is not anginal and has been occurring for months. Troponins  negative. Non-specific biphasic TWI in leads V2, V3 on EKG. CXR is negative. At home she is supposed to be on lipitor 80 mg po daily, metoprolol 25 mg daily, and warfarin 7.5 mg daily. -Heparin per pharmacy -Hold coumadin in setting of +FOBT -Morphine 1 mg iv q4h prn -Protonix 40 mg po daily -Atorvastatin 80 mg daily -Continue home  metoprolol 25 mg daily -Consider ACEI if BP allows -EKG in am -Cardiac monitoring  Coagulopathy: Patient has a history of CVA, MI x 2 and apical thrombus. Patient has a family history of clots, CVA and MI at young ages. Patient has extensive workup in the past and has been on coumadin for anticoagulation. However, previous coagulation work-up has shown normal antithrombin III x 4, normal homocysteine, negative lupus anticoagulant, negative prothrombin gene mutation, negative factor V leiden gene mutation, negative cardiolipin antibodies. Patient has a history convincing of coagulopathy, but work up negative thus far.  -Heparin per pharmacy -Hold coumadin -Protein C activity in process -Protein C total in process -ANA in process -RPR in process  Normocytic Anemia: Presenting hemoglobin is 9.9 and was 11 twelve days ago. Her baseline appears to be ~ 11 for the past year although three years ago she had hemoglobin ~ 13. Last anemia panel 06/2011 with iron 80, TIBC 290, ferritin 56. FOBT positive. LDH normal at 144, Retic 1.6, ferritin normal at 54, elevated iron at 251, saturation ratio high at 74, folate intermediate at 3.6, vitamin b12 low at 186. Patient admits to darker stools and clots in stool. Patient has a history of external hemorrhoids, erosive gastritis, esophagitis, duodenitis seen May 2015. GI recommended PPI, avoiding NSAIDs and close follow since patient on anticoagulation.  -Consult to GI  -Hold  Coumadin -Continue Heparin gtt -Repeat CBC tomorrow am  -Protonix 40 mg daily  Apical Mural Thrombus: Ms. Rueger had TTE 08/02/2013 when admitted for pancreatitis which demonstrated 1.0 cm x 0.5 cm apical mural thrombus. Recent TTE 02/22/14 had no thrombus. Given history of thrombus and recent CVA, Cardiology recommends continuing coumadin with INR goal of 2-3. INR subtherapeutic. -Heparin gtt -Hold Coumadin in setting of +FOBT  HTN: BP controlled 130-150s/80-90s. On metoprolol 25 mg po daily at home.  -Continue metoprolol 25 mg po daily  HLD: Last lipid profile 02/20/14 with cholesterol 200, triglycerides 189, HDL 82, LDL 80. On atorvastatin 80 mg po qhs at home. -Atorvastatin 80 mg po qhs   DVT/PE ppxx: Heparin gtt  Dispo: Disposition is deferred at this time, awaiting improvement of current medical problems.  Anticipated discharge in approximately 1-2 day(s).   The patient does have a current PCP (Ejiroghene Wendall Stade, MD) and does need an [facility] hospital follow-up appointment after discharge.  The patient does not have transportation limitations that hinder transportation to clinic appointments.  .Services Needed at time of discharge: Y = Yes, Blank = No PT:   OT:   RN:   Equipment:   Other:     LOS: 1 day   Jill Alexanders, DO PGY-1 Internal Medicine Resident Pager # [phone] 03/27/2014 5:30 PM

## 2014-03-27 NOTE — Progress Notes (Signed)
UR completed 

## 2014-03-27 NOTE — Progress Notes (Signed)
ANTICOAGULATION CONSULT NOTE - Follow Up Consult  Pharmacy Consult for Heparin, Warfarin  Indication: AT-III deficiency   No Known Allergies  Patient Measurements: ~76 kg  Vital Signs: Temp: 98.2 F (36.8 C) (01/11 0938) Temp Source: Oral (01/11 0938) BP: 152/95 mmHg (01/11 1030) Pulse Rate: 70 (01/11 1030)  Labs:  Recent Labs  03/26/14 1712 03/26/14 1729 03/26/14 2202 03/27/14 0155 03/27/14 0156 03/27/14 0200 03/27/14 0750 03/27/14 1002  HGB 9.9*  --   --   --   --   --   --   --   HCT 29.8*  --   --   --   --   --   --   --   PLT 314  --   --   --   --   --   --   --   APTT  --   --  27  --   --   --   --   --   LABPROT  --  13.1  --   --  14.6  --   --   --   INR  --  0.98  --   --  1.12  --   --   --   HEPARINUNFRC  --   --   --   --   --  0.14* <0.10*  --   CREATININE 0.58  --  0.64  --  0.63  --   --   --   CKTOTAL 162  --   --   --   --   --   --   --   TROPONINI  --   --  <0.03 <0.03  --   --   --  <0.03    Medical History: Past Medical History  Diagnosis Date  . Stroke     assoc with short term memory loss and right peripheral vision loss; age 53   . Blood transfusion   . Anxiety   . Coronary artery disease     Apical LAD infarction '00; NSTEMI s/p BMS to prox LAD '09; Cath 12/2011 single vessel CAD w/ patent LAD stent w/ stable mild ISR, nl LV systolic fxn  . Anemia   . Antithrombin III deficiency     ?pt not sure if true diagnosis  . Tobacco abuse   . High blood pressure   . Myocardial infarction    Assessment: 44 y/o F on warfarin PTA for AT-III deficiency, has remote history of stroke, MI x 2. Pt here with CP,B/L leg pain and N/V. Pt hasn't been taking warfarin for a few days because inability to get to store d/t pain. INR is 0.98 upon admission. H/H slight drop since last month 11.8/35> 9.9/28. FOB + in ED. Tp neg  No overt bleeding per pt.   Heparin drip 900 uts/hr HL < 0.1 - but pump beeping d/t line occlusion.    Goal of Therapy:  INR  2-3 Monitor platelets by anticoagulation protocol: Yes   Plan:  Increase  heparin drip to 1050 units/hr 6hr HL Daily HL, CBC, Protime     Leota Sauers Pharm.D. CPP, BCPS Clinical Pharmacist [phone] 03/27/2014 11:19 AM

## 2014-03-27 NOTE — Progress Notes (Signed)
ANTICOAGULATION CONSULT NOTE - Follow Up Consult  Pharmacy Consult for Heparin Indication: ATIII def.  No Known Allergies  Patient Measurements: Height: 5\' 6"  (167.6 cm) Weight: 155 lb 9.6 oz (70.58 kg) IBW/kg (Calculated) : 59.3 Heparin Dosing Weight: 70.6 kg  Vital Signs: Temp: 98.2 F (36.8 C) (01/11 1517) Temp Source: Oral (01/11 0938) BP: 148/82 mmHg (01/11 1517) Pulse Rate: 65 (01/11 1517)  Labs:  Recent Labs  03/26/14 1712 03/26/14 1729 03/26/14 2202 03/27/14 0155 03/27/14 0156 03/27/14 0200 03/27/14 0750 03/27/14 1002 03/27/14 1742  HGB 9.9*  --   --   --   --   --   --   --   --   HCT 29.8*  --   --   --   --   --   --   --   --   PLT 314  --   --   --   --   --   --   --   --   APTT  --   --  27  --   --   --   --   --   --   LABPROT  --  13.1  --   --  14.6  --   --   --   --   INR  --  0.98  --   --  1.12  --   --   --   --   HEPARINUNFRC  --   --   --   --   --  0.14* <0.10*  --  <0.10*  CREATININE 0.58  --  0.64  --  0.63  --   --   --   --   CKTOTAL 162  --   --   --   --   --   --   --   --   TROPONINI  --   --  <0.03 <0.03  --   --   --  <0.03  --     Estimated Creatinine Clearance: 83.1 mL/min (by C-G formula based on Cr of 0.63).   Assessment: 44 y/o F on warfarin PTA for AT-III deficiency, has remote history of stroke, MI x 2. Pt here with CP, b/l leg pain, N/V. Pt hasn't been taking warfarin for a few days d/t inability to get to store d/t pain and INR is 0.98 upon admission.  Anticoag: Heparin level remains undetectable. Hgb notably low.  FOCT + in ED. RN does report IV line issues in ED prior to getting to 2W.  Goal of Therapy:  Heparin level 0.3-0.7 units/ml Monitor platelets by anticoagulation protocol: Yes   Plan:  Dopplers? Increase IV heparin to 1250 units/hr Recheck heparin level in 6 hrs.    Quron Ruddy S. Merilynn Finland, PharmD, BCPS Clinical Staff Pharmacist Pager [phone]  Misty Stanley Stillinger 03/27/2014,6:27  PM

## 2014-03-27 NOTE — ED Notes (Signed)
Pt states she needs sedative to be able to take MRI; Md paged

## 2014-03-27 NOTE — Evaluation (Signed)
Physical Therapy Evaluation Patient Details Name: Lindsey [name] MRN: [id number] DOB: 12-01-1970 Today's Date: 03/27/2014   History of Present Illness  Pt adm with BLE pain and found to have HNP at L5-S1.  Clinical Impression  Pt admitted with above diagnosis. Pt currently with functional limitations due to the deficits listed below (see PT Problem List).  Pt will benefit from skilled PT to increase their independence and safety with mobility to allow discharge to the venue listed below.  Pt mobilizing better than she was over the past two days. Reports pain has been more controlled.      Follow Up Recommendations Outpatient PT (possibly)    Equipment Recommendations  None recommended by PT    Recommendations for Other Services       Precautions / Restrictions Precautions Precautions: None      Mobility  Bed Mobility Overal bed mobility: Needs Assistance Bed Mobility: Sidelying to Sit;Sit to Sidelying   Sidelying to sit: Supervision     Sit to sidelying: Supervision General bed mobility comments: Verbal cues for technique to decr pressure on back.  Transfers Overall transfer level: Modified independent Equipment used: None                Ambulation/Gait Ambulation/Gait assistance: Modified independent (Device/Increase time) Ambulation Distance (Feet): 200 Feet Assistive device: None Gait Pattern/deviations: Step-through pattern;Decreased stride length   Gait velocity interpretation: Below normal speed for age/gender General Gait Details: Steady gait but slower than normal and guarded.  Stairs            Wheelchair Mobility    Modified Rankin (Stroke Patients Only)       Balance Overall balance assessment: No apparent balance deficits (not formally assessed)                                           Pertinent Vitals/Pain Pain Assessment: 0-10 Pain Score: 6  Pain Location: Bil lower extremities Pain Descriptors /  Indicators: Aching;Sore Pain Intervention(s): Limited activity within patient's tolerance;Monitored during session;Repositioned    Home Living Family/patient expects to be discharged to:: Private residence Living Arrangements: Children Available Help at Discharge: Family;Available PRN/intermittently Type of Home: House Home Access: Stairs to enter Entrance Stairs-Rails: Doctor, general practice of Steps: 3 Home Layout: One level Home Equipment: None      Prior Function Level of Independence: Independent               Hand Dominance        Extremity/Trunk Assessment   Upper Extremity Assessment: Overall WFL for tasks assessed           Lower Extremity Assessment: RLE deficits/detail;LLE deficits/detail RLE Deficits / Details: Strength limited by pain. LLE Deficits / Details: tingling in left foot and leg. Strength limited by pain.     Communication   Communication: No difficulties  Cognition Arousal/Alertness: Awake/alert Behavior During Therapy: WFL for tasks assessed/performed Overall Cognitive Status: Within Functional Limits for tasks assessed                      General Comments      Exercises        Assessment/Plan    PT Assessment Patient needs continued PT services  PT Diagnosis Acute pain;Difficulty walking   PT Problem List Decreased activity tolerance;Decreased mobility;Pain  PT Treatment Interventions Gait training;Stair training;Functional mobility training;Therapeutic activities;Therapeutic exercise;Patient/family education  PT Goals (Current goals can be found in the Care Plan section) Acute Rehab PT Goals Patient Stated Goal: get better PT Goal Formulation: With patient Time For Goal Achievement: 04/03/14 Potential to Achieve Goals: Good Additional Goals Additional Goal #1: Pt will verbalize understanding of good body mechanics.    Frequency Min 3X/week   Barriers to discharge        Co-evaluation                End of Session   Activity Tolerance: Patient tolerated treatment well Patient left: in bed;with call bell/phone within reach Nurse Communication: Mobility status         Time: 5520-8022 PT Time Calculation (min) (ACUTE ONLY): 16 min   Charges:   PT Evaluation $Initial PT Evaluation Tier I: 1 Procedure PT Treatments $Self Care/Home Management: 8-22   PT G Codes:        Keyshla Tunison Apr 13, 2014, 4:17 PM  [facility] PT [phone]

## 2014-03-27 NOTE — ED Notes (Signed)
Patient transported to MRI 

## 2014-03-28 DIAGNOSIS — K625 Hemorrhage of anus and rectum: Secondary | ICD-10-CM

## 2014-03-28 LAB — CBC
HCT: 32.1 % — ABNORMAL LOW (ref 36.0–46.0)
Hemoglobin: 10.4 g/dL — ABNORMAL LOW (ref 12.0–15.0)
MCH: 31.6 pg (ref 26.0–34.0)
MCHC: 32.4 g/dL (ref 30.0–36.0)
MCV: 97.6 fL (ref 78.0–100.0)
PLATELETS: 187 10*3/uL (ref 150–400)
RBC: 3.29 MIL/uL — ABNORMAL LOW (ref 3.87–5.11)
RDW: 16.4 % — ABNORMAL HIGH (ref 11.5–15.5)
WBC: 10 10*3/uL (ref 4.0–10.5)

## 2014-03-28 LAB — BASIC METABOLIC PANEL
Anion gap: 7 (ref 5–15)
BUN: 5 mg/dL — ABNORMAL LOW (ref 6–23)
CO2: 28 mmol/L (ref 19–32)
Calcium: 8.8 mg/dL (ref 8.4–10.5)
Chloride: 101 mEq/L (ref 96–112)
Creatinine, Ser: 0.73 mg/dL (ref 0.50–1.10)
Glucose, Bld: 109 mg/dL — ABNORMAL HIGH (ref 70–99)
Potassium: 3.6 mmol/L (ref 3.5–5.1)
Sodium: 136 mmol/L (ref 135–145)

## 2014-03-28 LAB — ANA: ANA: NEGATIVE

## 2014-03-28 LAB — HEPARIN LEVEL (UNFRACTIONATED)
HEPARIN UNFRACTIONATED: 0.2 [IU]/mL — AB (ref 0.30–0.70)
Heparin Unfractionated: 0.21 IU/mL — ABNORMAL LOW (ref 0.30–0.70)
Heparin Unfractionated: 0.3 IU/mL (ref 0.30–0.70)
Heparin Unfractionated: 0.63 IU/mL (ref 0.30–0.70)

## 2014-03-28 LAB — RPR: RPR: NONREACTIVE

## 2014-03-28 LAB — PROTIME-INR
INR: 0.99 (ref 0.00–1.49)
Prothrombin Time: 13.1 seconds (ref 11.6–15.2)

## 2014-03-28 MED ORDER — PEG-KCL-NACL-NASULF-NA ASC-C 100 G PO SOLR: 1.0000 | Freq: Once | ORAL | Status: DC

## 2014-03-28 MED ORDER — HYDROCORTISONE ACETATE 25 MG RE SUPP
25.0000 mg | Freq: Two times a day (BID) | RECTAL | Status: DC
  Administered 2014-03-28 – 2014-03-30 (×5): 25 mg via RECTAL
  Filled 2014-03-28 (×6): qty 1

## 2014-03-28 MED ORDER — PEG-KCL-NACL-NASULF-NA ASC-C 100 G PO SOLR
0.5000 | Freq: Once | ORAL | Status: AC
  Administered 2014-03-29: 100 g via ORAL
  Filled 2014-03-28: qty 1

## 2014-03-28 MED ORDER — PROMETHAZINE HCL 25 MG/ML IJ SOLN
25.0000 mg | Freq: Four times a day (QID) | INTRAMUSCULAR | Status: DC | PRN
Start: 2014-03-28 — End: 2014-03-30
  Administered 2014-03-28: 25 mg via INTRAVENOUS
  Filled 2014-03-28: qty 1

## 2014-03-28 MED ORDER — PEG-KCL-NACL-NASULF-NA ASC-C 100 G PO SOLR
0.5000 | Freq: Once | ORAL | Status: AC
Start: 2014-03-28 — End: 2014-03-28
  Administered 2014-03-28: 100 g via ORAL
  Filled 2014-03-28: qty 1

## 2014-03-28 NOTE — Progress Notes (Signed)
ANTICOAGULATION CONSULT NOTE Pharmacy Consult for Heparin Indication: coagulopathy  No Known Allergies  Patient Measurements: Height: 5\' 6"  (167.6 cm) Weight: 155 lb 9.6 oz (70.58 kg) IBW/kg (Calculated) : 59.3 Heparin Dosing Weight: 70.6 kg  Vital Signs: Temp: 98.1 F (36.7 C) (01/12 1304) Temp Source: Oral (01/12 1304) BP: 125/83 mmHg (01/12 1304) Pulse Rate: 60 (01/12 1304)  Labs:  Recent Labs  03/26/14 1712 03/26/14 1729 03/26/14 2202 03/27/14 0155 03/27/14 0156  03/27/14 1002  03/28/14 0459 03/28/14 0940 03/28/14 1957  HGB 9.9*  --   --   --   --   --   --   --  10.4*  --   --   HCT 29.8*  --   --   --   --   --   --   --  32.1*  --   --   PLT 314  --   --   --   --   --   --   --  187  --   --   APTT  --   --  27  --   --   --   --   --   --   --   --   LABPROT  --  13.1  --   --  14.6  --   --   --  13.1  --   --   INR  --  0.98  --   --  1.12  --   --   --  0.99  --   --   HEPARINUNFRC  --   --   --   --   --   < >  --   < > 0.30 0.20* 0.63  CREATININE 0.58  --  0.64  --  0.63  --   --   --  0.73  --   --   CKTOTAL 162  --   --   --   --   --   --   --   --   --   --   TROPONINI  --   --  <0.03 <0.03  --   --  <0.03  --   --   --   --   < > = values in this interval not displayed.  Estimated Creatinine Clearance: 83.1 mL/min (by C-G formula based on Cr of 0.73).   Assessment: 44 y/o Female with h/o CVA/LV thrombus,  INR subtherapeutic, for heparin.   Repeat heparin level low this morning at 0.2. Coumadin placed on hold for GI workup and +FOBT. Heparin level this PM 0.63 now in goal range.  Goal of Therapy:  Heparin level 0.3-0.7 units/ml Monitor platelets by anticoagulation protocol: Yes   Plan:  Continue heparin at 1550 units/hr until midnight Off at midnight for colonoscopy.  Kiwana Deblasi S. Merilynn Finland, PharmD, [facility] Clinical Staff Pharmacist Pager [phone]  03/28/2014 8:32 PM

## 2014-03-28 NOTE — Progress Notes (Signed)
Subjective:  Patient was seen and examined this morning. Patient states chest pain resolved, no shortness of breath. She continues to have nausea and vomiting. No abdominal pain, fever, chills, or diarrhea. She did have a BM yesterday without notice of blood. Patient continues to have pain in numbness in lower extremities bilaterally, slightly improved from yesterday. She is able to ambulate.  Objective: Vital signs in last 24 hours: Filed Vitals:   03/27/14 2011 03/28/14 0443 03/28/14 1012 03/28/14 1304  BP: 134/99 149/90 112/87 125/83  Pulse: 65 62 63 60  Temp: 98.5 F (36.9 C)  98.4 F (36.9 C) 98.1 F (36.7 C)  TempSrc: Oral Oral Oral Oral  Resp: '18 18 18 18  ' Height:      Weight:      SpO2: 100% 99% 99% 99%   Weight change:   Intake/Output Summary (Last 24 hours) at 03/28/14 1359 Last data filed at 03/28/14 1014  Gross per 24 hour  Intake    480 ml  Output      0 ml  Net    480 ml   General: Vital signs reviewed.  Patient is well-developed and well-nourished, in no acute distress and cooperative with exam.  Cardiovascular: RRR, S1 normal, S2 normal, no murmurs, gallops, or rubs. Pulmonary/Chest: Clear to auscultation bilaterally, no wheezes, rales, or rhonchi Abdominal: Soft, non-tender, non-distended, BS +, no guarding Extremities: No lower extremity edema bilaterally, no increased warmth. Diffuse tenderness on palpation of lower extremities.   Pulses symmetric and intact bilaterally. Strength is 5/5 in left lower extremity and 4/5 with plantar flexion and hip extension, 3/5 with dorsiflexion and hip flexion. Strength is limited by pain. Skin: Warm, dry and intact. No rashes or erythema. Psychiatric: Normal mood and affect. speech and behavior is normal. Cognition and memory are normal.   Lab Results: Basic Metabolic Panel:  Recent Labs Lab 03/26/14 2202 03/27/14 0156 03/28/14 0459  NA 143 138 136  K 3.7 3.3* 3.6  CL 107 104 101  CO2 '22 24 28  ' GLUCOSE 96 95  109*  BUN 5* 5* 5*  CREATININE 0.64 0.63 0.73  CALCIUM 8.9 8.5 8.8  MG 1.6  --   --   PHOS 3.2  --   --    Liver Function Tests:  Recent Labs Lab 03/26/14 2202  AST 44*  ALT 22  ALKPHOS 95  BILITOT 0.8  PROT 6.6  ALBUMIN 3.2*   CBC:  Recent Labs Lab 03/26/14 1712 03/28/14 0459  WBC 5.1 10.0  HGB 9.9* 10.4*  HCT 29.8* 32.1*  MCV 94.0 97.6  PLT 314 187   Cardiac Enzymes:  Recent Labs Lab 03/26/14 1712 03/26/14 2202 03/27/14 0155 03/27/14 1002  CKTOTAL 162  --   --   --   TROPONINI  --  <0.03 <0.03 <0.03   Coagulation:  Recent Labs Lab 03/26/14 1729 03/27/14 0156 03/28/14 0459  LABPROT 13.1 14.6 13.1  INR 0.98 1.12 0.99   Anemia Panel:  Recent Labs Lab 03/26/14 2202  VITAMINB12 186*  FOLATE 3.6  FERRITIN 54  TIBC 337  IRON 251*  RETICCTPCT 1.6   Urine Drug Screen: Drugs of Abuse     Component Value Date/Time   LABOPIA NONE DETECTED 03/26/2014 1755   COCAINSCRNUR NONE DETECTED 03/26/2014 1755   LABBENZ NONE DETECTED 03/26/2014 1755   AMPHETMU NONE DETECTED 03/26/2014 1755   THCU NONE DETECTED 03/26/2014 1755   LABBARB NONE DETECTED 03/26/2014 1755    Alcohol Level:  Recent Labs Lab  03/26/14 2202  ETH <5   Micro Results: Recent Results (from the past 240 hour(s))  MRSA PCR Screening     Status: None   Collection Time: 03/26/14 10:23 PM  Result Value Ref Range Status   MRSA by PCR NEGATIVE NEGATIVE Final    Comment:        The GeneXpert MRSA Assay (FDA approved for NASAL specimens only), is one component of a comprehensive MRSA colonization surveillance program. It is not intended to diagnose MRSA infection nor to guide or monitor treatment for MRSA infections.    Studies/Results: Dg Chest 2 View  03/26/2014   CLINICAL DATA:  Centralized chest pain.  EXAM: CHEST  2 VIEW  COMPARISON:  Radiographs and CT 03/15/2014  FINDINGS: The cardiomediastinal contours are normal. Cardiac stent is seen. The lungs are clear. Pulmonary  vasculature is normal. No consolidation, pleural effusion, or pneumothorax. No acute osseous abnormalities are seen.  IMPRESSION: No acute pulmonary process.   Electronically Signed   By: Jeb Levering M.D.   On: 03/26/2014 19:05   Mr Lumbar Spine Wo Contrast  03/27/2014   CLINICAL DATA:  Back pain for 2 weeks, increasing in severity, difficulty ambulating.  EXAM: MRI LUMBAR SPINE WITHOUT CONTRAST  TECHNIQUE: Multiplanar, multisequence MR imaging of the lumbar spine was performed. No intravenous contrast was administered.  COMPARISON:  CT of the abdomen and pelvis October 30, 2013  FINDINGS: Lumbar vertebral bodies and posterior elements are intact and aligned with maintenance of lumbar lordosis. Using the reference level of the last well-formed intervertebral disc as L5-S1, mild L4-5 and L5-S1 disc height loss, is slight desiccation these 2 discs. Minimal acute discogenic endplate changes superior posterior S1.  Conus medullaris terminates at L1-2 and appears normal morphology and signal characteristics. Cauda equina is unremarkable. The RIGHT psoas muscle is slightly smaller than the LEFT, recommend correlation with ambulation.  Level by level evaluation:  L1-2, L2-3: No disc bulge, canal stenosis nor neural foraminal narrowing.  L3-4: 2 mm broad-based disc bulge asymmetric to the RIGHT. Mild facet arthropathy and ligamentum flavum redundancy. No canal stenosis. Mild to moderate RIGHT, mild LEFT neural foraminal narrowing.  L4-5: 2-3 mm broad-based disc bulge, mild facet arthropathy and ligamentum flavum redundancy without canal stenosis. Moderate LEFT greater than RIGHT neural foraminal narrowing.  L5-S1: LEFT central approximate 7 x 4 mm (transverse by AP) disc extrusion with 7 mm of caudal subligamentous contiguous extent, contacting the traversing LEFT S1 nerve within the lateral recess. Intermediate to bright STIR signal within the extruded material suggest acuity. Mild canal stenosis. Mild bilateral  neural foraminal narrowing.  IMPRESSION: Approximate 7 x 4 x 7 mm LEFT central L5-S1 acute appearing contiguous disc extrusion contacting the traversing LEFT S1 nerve, resulting mild canal stenosis.  No acute fracture nor malalignment.  L4-5 and L5-S1 neural foraminal narrowing: Moderate at L4-5.   Electronically Signed   By: Elon Alas   On: 03/27/2014 03:52   Medications: I have reviewed the patient's current medications. Prior to Admission:  Prescriptions prior to admission  Medication Sig Dispense Refill Last Dose  . acetaminophen (TYLENOL) 325 MG tablet Take 2 tablets (650 mg total) by mouth every 6 (six) hours as needed for moderate pain, fever or headache. 30 tablet 0 2 weeks  . atorvastatin (LIPITOR) 40 MG tablet Take 2 tablets (80 mg total) by mouth daily. (Patient taking differently: Take 80 mg by mouth at bedtime. ) 90 tablet 11 03/25/2014 at Unknown time  . ibuprofen (ADVIL,MOTRIN) 600 MG  tablet Take 1 tablet (600 mg total) by mouth every 6 (six) hours as needed for headache. 30 tablet 0 unknown  . metoprolol succinate (TOPROL XL) 25 MG 24 hr tablet Take 1 tablet (25 mg total) by mouth daily. 30 tablet 12 03/26/2014 at 0900  . nitroGLYCERIN (NITROSTAT) 0.4 MG SL tablet Place 1 tablet (0.4 mg total) under the tongue every 5 (five) minutes as needed for chest pain (up to 3 doses). 25 tablet 3 unknown  . warfarin (COUMADIN) 5 MG tablet Take 1.5 tablets (7.5 mg total) by mouth daily. 42 tablet 0 03/23/2014   Scheduled Meds: . atorvastatin  80 mg Oral q1800  . hydrocortisone  25 mg Rectal BID  . metoprolol succinate  25 mg Oral Daily  . pantoprazole  40 mg Oral Daily  . peg 3350 powder  0.5 kit Oral Once   And  . [START ON 03/29/2014] peg 3350 powder  0.5 kit Oral Once  . predniSONE  60 mg Oral Q breakfast  . sodium chloride  3 mL Intravenous Q12H   Continuous Infusions: . heparin 1,550 Units/hr (03/28/14 1302)   PRN Meds:.acetaminophen, albuterol, HYDROmorphone (DILAUDID)  injection, promethazine Assessment/Plan: Principal Problem:   Chest pain Active Problems:   Hyperlipemia   Essential hypertension   Coronary Artery Disease   Paresthesias   Antithrombin III deficiency   Apical mural thrombus   Long term current use of anticoagulant therapy   History of ischemic left PCA stroke   Bilateral lower extremity pain   Elevated AST (SGOT)   Radiculopathy of lumbosacral region   Lumbar herniated disc   Rectal bleeding  Lumbar Radiculopathy: MRI Lumbar Spine showed an approximate 7 x 4 x 7 mm LEFT central L5-S1 acute appearing contiguous disc extrusion contacting the traversing LEFT S1 nerve, resulting mild canal stenosis. Continue conservative management with NSAIDs or acetaminophen is recommended in disc herniations with no or mild motor deficit. Symptoms should be re-evaluated at 2 week interval to assess response. Patient is working with PT with some improvement. PT recommending outpatient PT.  -PT/OT -Morphine 1 mg IV Q4H prn -Prednisone 60 mg daily for 5-7 days -No NSAIDs in setting of gastritis  Chest Pain in Setting of CAD and H/o MI and NSTEMI: Resolved. At home she is on lipitor 80 mg po daily, metoprolol 25 mg daily, and warfarin 7.5 mg daily. -Heparin per pharmacy -Hold coumadin in setting of +FOBT -Morphine 1 mg IV Q4H prn -Protonix 40 mg po daily -Atorvastatin 80 mg daily -Continue home metoprolol 25 mg daily -Cardiac monitoring  Coagulopathy Disorder: Significant personal history and family history, although work up has been negative thus far. Patient is on coumadin at home. ANA and RPR negative.  -Heparin per pharmacy -Hold coumadin -Protein C activity in process -Protein C total in process  Normocytic Anemia: Hemoglobin stable 9.9>10.4. +FOBT could be secondary to hemorrhoids, but given history of gastritis and duodenitis, requirement of long term anticoagulation, and recent change in stools, we consulted GI for possible scope. GI  recommended PPI, avoiding NSAIDs and colonoscopy. If colonoscopy unremarkable, we will consider consult to general surgery for hemorrhoidectomy. Patient will have a colonoscopy in the morning. We will make patient NPO and stop heparin at midnight so she is off for more than 6 hours prior. This was discussed with Erin Hearing in Pharmacy. Note has been made to stop heparin at midnight.  -Hold Coumadin -Stop Heparin gtt at midnight -Repeat CBC tomorrow am  -Protonix 40 mg daily -NPO for colonoscopy -  Hydrocortison (Anusol) suppositories 25 mg BID  Apical Mural Thrombus: Ms. Donahoo had TTE 08/02/2013 when admitted for pancreatitis which demonstrated 1.0 cm x 0.5 cm apical mural thrombus. Recent TTE 02/22/14 had no thrombus. Given history of thrombus and recent CVA, Cardiology recommends continuing coumadin with INR goal of 2-3. INR subtherapeutic. -Stop Heparin gtt at midnight for procedure -Hold Coumadin in setting of +FOBT  HTN: BP controlled 130-150s/80-90s. On metoprolol 25 mg po daily at home.  -Continue metoprolol 25 mg po daily  DVT/PE ppxx: Heparin gtt  Dispo: Disposition is deferred at this time, awaiting improvement of current medical problems.  Anticipated discharge in approximately 1-2 day(s).   The patient does have a current PCP (Ejiroghene Arlyce Dice, MD) and does need an [facility] hospital follow-up appointment after discharge.  The patient does not have transportation limitations that hinder transportation to clinic appointments.  .Services Needed at time of discharge: Y = Yes, Blank = No PT:   OT:   RN:   Equipment:   Other:     LOS: 2 days   Osa Craver, DO PGY-1 Internal Medicine Resident Pager # [phone] 03/28/2014 1:59 PM

## 2014-03-28 NOTE — Consult Note (Signed)
                                                                           [facility] Consult: 9:30 AM 03/28/2014  LOS: 2 days    Referring Provider: Dr.Grqnfortuna  Primary Care Physician:  Emokpae, Ejiroghene, MD Primary Gastroenterologist:  Unassigned.  Seen by Hung, Jacobs, Stark in past      Reason for Consultation:  Bleeding per rectum   HPI: Lindsey [name] is a 43 y.o. female.  Hx CVA, MI, CAD, MI at age 27.  Bare metal cardiac stent 2009.  Clinically appears to have hypercoagulable disorder, though exact type remains elusive after many labs.  ETOH pancreatitis 2012, 05/2011, 07/2013.  No GB disease or biliary tree pathology on 12/2010 ultrasound or CT but it did show fatty liver. On Coumadin for left ventricuar thrombus. In early 02/2014, while in [place] and temporarily off Coumadin for dental procedure had left sided numbness, blurry vision, headache.  CT showed previous infarct, nothing acute.   Hx intermittent blood in stools.  She is currently FOBT +.  Hgb is 10.4, baseline is ~9.5-11.5, was 11.0 on 03/15/14. PT 13.1, INR 0.99. For about 4 months the patient has been seeing small to moderate amounts of blood when she has a bowel movement. This occurs with most of her bowel movements though sometimes she'll have nonbloody bowel movements. The stool itself is brown. The blood will be clotted.  There is no pain in her rectum. She previously had problems with constipation but that has resolved. It is easy for her to pass her stools. Sometimes she has loose stools, a couple of weeks ago she did have several diarrheal stools which has resolved. Her appetite is stable. She has no heartburn or indigestion. There is no abdominal pain. Last week she did take 1200 mg of ibuprofen on one occasion to deal with pain she has in her legs.  Besides this she has not been using NSAIDs and she  does not take aspirin.    ENDOSCOPIC STUDIES: 07/2013  EGD  Dr Stark For hematemesis in setiing of BC powders and inconsistent use of PPI. .  1. LA Class B esophagitis 2. Erosive gastritis in the gastric body 3. Duodenitis in the bulb and second portion of the duodenum 4. Small hiatal hernia  05/2012  EGD  Dr Hung For hematemesis.  2 cm HH otherwise normal study.   12/2007 Colonoscopy   Dr Jacobs.  For intermittent rectal bleeding, IDA Showed diverticulosis and external rrhoids   Past Medical History  Diagnosis Date  . Stroke     assoc with short term memory loss and right peripheral vision loss; age 38   . Blood transfusion   . Anxiety   . Coronary artery disease     Apical LAD infarction '00; NSTEMI s/p BMS to prox LAD '09; Cath 12/2011 single vessel CAD w/ patent LAD stent w/ stable mild ISR, nl LV systolic fxn  . Anemia   . Antithrombin III deficiency     ?pt not sure if true diagnosis  . Tobacco abuse   . High blood pressure   . Myocardial infarction     Past Surgical History  Procedure Laterality   Date  . Tubal ligation    . Cardiac catheterization    . Esophagogastroduodenoscopy N/A 06/09/2012    Procedure: ESOPHAGOGASTRODUODENOSCOPY (EGD);  Surgeon: Patrick D Hung, MD;  Location: [facility] ENDOSCOPY;  Service: Endoscopy;  Laterality: N/A;  . Coronary angioplasty    . Esophagogastroduodenoscopy N/A 08/02/2013    Procedure: ESOPHAGOGASTRODUODENOSCOPY (EGD);  Surgeon: Malcolm T Stark, MD;  Location: [facility] ENDOSCOPY;  Service: Endoscopy;  Laterality: N/A;  . Left heart catheterization with coronary angiogram N/A 12/24/2011    Procedure: LEFT HEART CATHETERIZATION WITH CORONARY ANGIOGRAM;  Surgeon: Christopher D McAlhany, MD;  Location: [facility] CATH LAB;  Service: Cardiovascular;  Laterality: N/A;    Prior to Admission medications   Medication Sig Start Date End Date Taking? Authorizing Provider  acetaminophen (TYLENOL) 325 MG tablet Take 2 tablets (650 mg total) by mouth  every 6 (six) hours as needed for moderate pain, fever or headache. 02/21/14  Yes Tasrif Ahmed, MD  atorvastatin (LIPITOR) 40 MG tablet Take 2 tablets (80 mg total) by mouth daily. Patient taking differently: Take 80 mg by mouth at bedtime.  02/21/14 02/21/15 Yes Tasrif Ahmed, MD  ibuprofen (ADVIL,MOTRIN) 600 MG tablet Take 1 tablet (600 mg total) by mouth every 6 (six) hours as needed for headache. 02/21/14  Yes Tasrif Ahmed, MD  metoprolol succinate (TOPROL XL) 25 MG 24 hr tablet Take 1 tablet (25 mg total) by mouth daily. 03/03/14  Yes Brian S Crenshaw, MD  nitroGLYCERIN (NITROSTAT) 0.4 MG SL tablet Place 1 tablet (0.4 mg total) under the tongue every 5 (five) minutes as needed for chest pain (up to 3 doses). 10/14/13  Yes Scott T Weaver, PA-C  warfarin (COUMADIN) 5 MG tablet Take 1.5 tablets (7.5 mg total) by mouth daily. 10/19/13  Yes Ejiroghene E Emokpae, MD    Scheduled Meds: . atorvastatin  80 mg Oral q1800  . metoprolol succinate  25 mg Oral Daily  . pantoprazole  40 mg Oral Daily  . predniSONE  60 mg Oral Q breakfast  . sodium chloride  3 mL Intravenous Q12H   Infusions: . heparin 1,400 Units/hr (03/28/14 0406)   PRN Meds: acetaminophen, albuterol, HYDROmorphone (DILAUDID) injection, promethazine   Allergies as of 03/26/2014  . (No Known Allergies)    Family History  Problem Relation Age of Onset  . Heart disease Brother     arrhythmia; died  . Breast cancer Maternal Aunt     History   Social History  . Marital Status: Divorced    Spouse Name: N/A    Number of Children: 2  . Years of Education: N/A   Occupational History  . Not on file.   Social History Main Topics  . Smoking status: Current Every Day Smoker -- 0.20 packs/day for 1 years    Types: Cigarettes  . Smokeless tobacco: Never Used  . Alcohol Use: 0.0 oz/week    0 Cans of beer per week     Comment: occasion  . Drug Use: Yes    Special: Cocaine     Comment: hx of use x 1  . Sexual Activity: No    Other Topics Concern  . Not on file   Social History Narrative    REVIEW OF SYSTEMS: Constitutional:  Stable weight. Stable energy level. ENT:  No nose bleeds Pulm:  No shortness of breath or cough. No pleuritic pain. CV:  No palpitations, no LE edema. No chest pain GU:  No hematuria, no frequency GI:  No dysphagia, no heartburn, no nausea. No abdominal   pain Heme:  No unusual bleeding from the skin or excessive bruising.   Transfusions:  Has had transfusions in the past but it's been a couple of years at least since her last transfusion Neuro:  bil leg numbness and pain MS: leg pain bil. Derm:  No itching, no rash or sores.  Endocrine:  No sweats or chills.  No polyuria or dysuria Immunization:  Received flu and pneumococcal as well asTdap vaccination in 2015 Travel:  None beyond local counties in last few months.    PHYSICAL EXAM: Vital signs in last 24 hours: Filed Vitals:   03/28/14 0443  BP: 149/90  Pulse: 62  Temp:   Resp: 18   Wt Readings from Last 3 Encounters:  03/27/14 155 lb 9.6 oz (70.58 kg)  03/03/14 166 lb 11.2 oz (75.615 kg)  02/19/14 164 lb (74.39 kg)    General: Looks well, overweight, comfortable Head:  No asymmetry or facial edema. No head trauma  Eyes:  No scleral icterus or conjunctival pallor Ears:  No hearing deficit  Nose:  No congestion or discharge Mouth:  Clear, moist, dentition in good repair. Neck:  No JVD, thyromegaly or masses Lungs:  Clear to auscultation and percussion bilaterally. No cough. No dyspnea Heart: RRR. No MRG. S1/S2 audible Abdomen:  Soft, NT, ND.  No organomegaly. No bruits. No masses.   Rectal: Palpable internal hemorrhoids. External hemorrhoidal tags. No blood or stool on exam glove.   Musc/Skeltl: No joint swelling contractures or deformities. Extremities:  No CCE.  Neurologic:  Oriented 3. Alert. Good historian. No limb weakness or asymmetric strength. No tremor Skin:  No rash, AVMs or sores. Tattoos:  None    Psych:  Pleasant, cooperative. In good spirits.  Intake/Output from previous day: 01/11 0701 - 01/12 0700 In: 240 [P.O.:240] Out: -  Intake/Output this shift:    LAB RESULTS:  Recent Labs  03/26/14 1712 03/28/14 0459  WBC 5.1 10.0  HGB 9.9* 10.4*  HCT 29.8* 32.1*  PLT 314 187  MCV     97  BMET Lab Results  Component Value Date   NA 136 03/28/2014   NA 138 03/27/2014   NA 143 03/26/2014   K 3.6 03/28/2014   K 3.3* 03/27/2014   K 3.7 03/26/2014   CL 101 03/28/2014   CL 104 03/27/2014   CL 107 03/26/2014   CO2 28 03/28/2014   CO2 24 03/27/2014   CO2 22 03/26/2014   GLUCOSE 109* 03/28/2014   GLUCOSE 95 03/27/2014   GLUCOSE 96 03/26/2014   BUN 5* 03/28/2014   BUN 5* 03/27/2014   BUN 5* 03/26/2014   CREATININE 0.73 03/28/2014   CREATININE 0.63 03/27/2014   CREATININE 0.64 03/26/2014   CALCIUM 8.8 03/28/2014   CALCIUM 8.5 03/27/2014   CALCIUM 8.9 03/26/2014   LFT  Recent Labs  03/26/14 2202  PROT 6.6  ALBUMIN 3.2*  AST 44*  ALT 22  ALKPHOS 95  BILITOT 0.8   PT/INR Lab Results  Component Value Date   INR 0.99 03/28/2014   INR 1.12 03/27/2014   INR 0.98 03/26/2014   Hepatitis Panel  Recent Labs  03/27/14 0156  HEPBSAG NEGATIVE  HCVAB NEGATIVE  HEPAIGM NON REACTIVE  HEPBIGM NON REACTIVE   C-Diff No components found for: CDIFF Lipase     Component Value Date/Time   LIPASE 629* 08/01/2013 0937    Drugs of Abuse     Component Value Date/Time   LABOPIA NONE DETECTED 03/26/2014 1755   COCAINSCRNUR NONE   DETECTED 03/26/2014 1755   LABBENZ NONE DETECTED 03/26/2014 1755   AMPHETMU NONE DETECTED 03/26/2014 1755   THCU NONE DETECTED 03/26/2014 1755   LABBARB NONE DETECTED 03/26/2014 1755     RADIOLOGY STUDIES: Dg Chest 2 View  03/26/2014   CLINICAL DATA:  Centralized chest pain.  EXAM: CHEST  2 VIEW  COMPARISON:  Radiographs and CT 03/15/2014  FINDINGS: The cardiomediastinal contours are normal. Cardiac stent is seen. The lungs are  clear. Pulmonary vasculature is normal. No consolidation, pleural effusion, or pneumothorax. No acute osseous abnormalities are seen.  IMPRESSION: No acute pulmonary process.   Electronically Signed   By: Melanie  Ehinger M.D.   On: 03/26/2014 19:05   Mr Lumbar Spine Wo Contrast  03/27/2014   CLINICAL DATA:  Back pain for 2 weeks, increasing in severity, difficulty ambulating.  EXAM: MRI LUMBAR SPINE WITHOUT CONTRAST  TECHNIQUE: Multiplanar, multisequence MR imaging of the lumbar spine was performed. No intravenous contrast was administered.  COMPARISON:  CT of the abdomen and pelvis October 30, 2013  FINDINGS: Lumbar vertebral bodies and posterior elements are intact and aligned with maintenance of lumbar lordosis. Using the reference level of the last well-formed intervertebral disc as L5-S1, mild L4-5 and L5-S1 disc height loss, is slight desiccation these 2 discs. Minimal acute discogenic endplate changes superior posterior S1.  Conus medullaris terminates at L1-2 and appears normal morphology and signal characteristics. Cauda equina is unremarkable. The RIGHT psoas muscle is slightly smaller than the LEFT, recommend correlation with ambulation.  Level by level evaluation:  L1-2, L2-3: No disc bulge, canal stenosis nor neural foraminal narrowing.  L3-4: 2 mm broad-based disc bulge asymmetric to the RIGHT. Mild facet arthropathy and ligamentum flavum redundancy. No canal stenosis. Mild to moderate RIGHT, mild LEFT neural foraminal narrowing.  L4-5: 2-3 mm broad-based disc bulge, mild facet arthropathy and ligamentum flavum redundancy without canal stenosis. Moderate LEFT greater than RIGHT neural foraminal narrowing.  L5-S1: LEFT central approximate 7 x 4 mm (transverse by AP) disc extrusion with 7 mm of caudal subligamentous contiguous extent, contacting the traversing LEFT S1 nerve within the lateral recess. Intermediate to bright STIR signal within the extruded material suggest acuity. Mild canal stenosis.  Mild bilateral neural foraminal narrowing.  IMPRESSION: Approximate 7 x 4 x 7 mm LEFT central L5-S1 acute appearing contiguous disc extrusion contacting the traversing LEFT S1 nerve, resulting mild canal stenosis.  No acute fracture nor malalignment.  L4-5 and L5-S1 neural foraminal narrowing: Moderate at L4-5.   Electronically Signed   By: Courtnay  Bloomer   On: 03/27/2014 03:52      IMPRESSION:   *  Mild-to-moderate rectal bleeding for at least 4 months. Suspect this is due to known hemorrhoids seen on exam today as well as on colonoscopy in 2009. Also known to have diverticulosis but diverticular bleed would normally be more dramatic than what she is describing. Fortunately the bleeding is not associated with worsening of her chronic anemia.  *  Chronic Coumadin therapy for hypercoagulable disorder and left ventricular thrombus. Her PT/INR are actually subtherapeutic.  *  Bilateral leg pain. MRI of spine confirms L5/S1 herniated disc and associated stenosis.  *  History esophagitis, gastritis, duodenitis and hiatal hernia. Currently these are asymptomatic, she is compliant with daily PPI. Had recently used a couple of ibuprofen but do not think this has any correlation with her rectal bleeding.    PLAN:     *  Will add twice a day rectal suppositories. As 2 flexible   sigmoidoscopy versus colonoscopy and therapy to hemorrhoids will defer that decision to Dr. Floyed Masoud.  *  Reminded patient to remain compliant with PPI and to avoid ibuprofen.   Sarah Gribbin  03/28/2014, 9:30 AM Pager: [phone]  GI Attending Note   Chart was reviewed and patient was examined. X-rays and lab were reviewed.   Persistent rectal bleeding likely is hemorrhoidal.  At the same time, I think it is prudent to rule out a more proximal colonic bleeding source since she will remain on long-term anticoagulation therapy.  Accordingly, I will proceed with colonoscopy.  Should no abnormalities be seen but hemorrhoids,  would consider evaluation by general surgery for hemorrhoidectomy. Xaivier Malay D. Burnell Hurta, M.D., FACG [facility] Cell [phone]       

## 2014-03-28 NOTE — Evaluation (Signed)
Occupational Therapy Evaluation Patient Details Name: Lindsey [name] MRN: [id number] DOB: [birth year] Today's Date: 03/28/2014    History of Present Illness Pt adm with BLE pain and found to have HNP at L5-S1.   Clinical Impression   Pt was independent in ADL and IADL prior to admission.  Performing ADL and ADL transfers at a modified independent level.  Educated in back precautions, see ADL comments.  Pt verbalized understanding.  Agree with plan for OPPT, no further OT needs.    Follow Up Recommendations  No OT follow up    Equipment Recommendations  None recommended by OT    Recommendations for Other Services       Precautions / Restrictions Precautions Precautions: Back Precaution Booklet Issued: Yes (comment) Precaution Comments: reviewed back precautions related to ADL and IADL Restrictions Weight Bearing Restrictions: No      Mobility Bed Mobility     General bed mobility comments: pt up in chair  Transfers Overall transfer level: Modified independent Equipment used: None               Balance                                            ADL Overall ADL's : Modified independent                                       General ADL Comments: Pt able to cross her foot over opposite knee to donn socks and perform toilet and simulated tub transfer at a modified independent level.  Pt instructed in 2 cup method or hinge at hips for toothbrushing, use of reacher for IADL, lifting restrictions, pericare avoiding twisting vs use of toilet tongs, and propping foot when prolonged standing is necessary.     Vision                     Perception     Praxis      Pertinent Vitals/Pain Pain Assessment: Faces Pain Score: 4  Faces Pain Scale: Hurts little more Pain Location: R LE Pain Descriptors / Indicators: Aching Pain Intervention(s): Monitored during session;Repositioned     Hand Dominance Right    Extremity/Trunk Assessment Upper Extremity Assessment Upper Extremity Assessment: Overall WFL for tasks assessed   Lower Extremity Assessment Lower Extremity Assessment: Defer to PT evaluation   Cervical / Trunk Assessment Cervical / Trunk Assessment: Normal   Communication Communication Communication: No difficulties   Cognition Arousal/Alertness: Awake/alert Behavior During Therapy: WFL for tasks assessed/performed Overall Cognitive Status: Within Functional Limits for tasks assessed                     General Comments       Exercises       Shoulder Instructions      Home Living Family/patient expects to be discharged to:: Private residence Living Arrangements: Children (97 year old and 71 year old daughters) Available Help at Discharge: Family;Available PRN/intermittently Type of Home: House Home Access: Stairs to enter [employer] of Steps: 3 Entrance Stairs-Rails: Right;Left Home Layout: One level     Bathroom Shower/Tub: Tub/shower unit;Door   [employer] Toilet: Standard     Home Equipment: None          Prior Functioning/Environment Level  of Independence: Independent             OT Diagnosis:     OT Problem List:     OT Treatment/Interventions:      OT Goals(Current goals can be found in the care plan section) Acute Rehab OT Goals Patient Stated Goal: get better  OT Frequency:     Barriers to D/C:            Co-evaluation              End of Session    Activity Tolerance: Patient tolerated treatment well Patient left: in chair;with call bell/phone within reach   Time: 1355-1419 OT Time Calculation (min): 24 min Charges:  OT General Charges $OT Visit: 1 Procedure OT Evaluation $Initial OT Evaluation Tier I: 1 Procedure OT Treatments $Self Care/Home Management : 8-22 mins G-Codes:    Evern Bio 03/28/2014, 2:36 PM  [phone]

## 2014-03-28 NOTE — Progress Notes (Addendum)
ANTICOAGULATION CONSULT NOTE Pharmacy Consult for Heparin Indication: coagulopathy  No Known Allergies  Patient Measurements: Height: 5\' 6"  (167.6 cm) Weight: 155 lb 9.6 oz (70.58 kg) IBW/kg (Calculated) : 59.3 Heparin Dosing Weight: 70.6 kg  Vital Signs: Temp: 98.4 F (36.9 C) (01/12 1012) Temp Source: Oral (01/12 1012) BP: 112/87 mmHg (01/12 1012) Pulse Rate: 63 (01/12 1012)  Labs:  Recent Labs  03/26/14 1712 03/26/14 1729 03/26/14 2202 03/27/14 0155 03/27/14 0156  03/27/14 1002  03/28/14 0112 03/28/14 0459 03/28/14 0940  HGB 9.9*  --   --   --   --   --   --   --   --  10.4*  --   HCT 29.8*  --   --   --   --   --   --   --   --  32.1*  --   PLT 314  --   --   --   --   --   --   --   --  187  --   APTT  --   --  27  --   --   --   --   --   --   --   --   LABPROT  --  13.1  --   --  14.6  --   --   --   --  13.1  --   INR  --  0.98  --   --  1.12  --   --   --   --  0.99  --   HEPARINUNFRC  --   --   --   --   --   < >  --   < > 0.21* 0.30 0.20*  CREATININE 0.58  --  0.64  --  0.63  --   --   --   --  0.73  --   CKTOTAL 162  --   --   --   --   --   --   --   --   --   --   TROPONINI  --   --  <0.03 <0.03  --   --  <0.03  --   --   --   --   < > = values in this interval not displayed.  Estimated Creatinine Clearance: 83.1 mL/min (by C-G formula based on Cr of 0.73).   Assessment: 44 y/o Female with h/o CVA/LV thrombus,  INR subtherapeutic, for heparin.   Repeat heparin level low this morning at 0.2. Coumadin placed on hold for GI workup and +FOBT.   Goal of Therapy:  Heparin level 0.3-0.7 units/ml Monitor platelets by anticoagulation protocol: Yes   Plan:  Increase Heparin 1550 units/hr Check heparin level in 6 hours.  Hold coumadin for now  Sheppard Coil PharmD., BCPS Clinical Pharmacist Pager [phone] 03/28/2014 12:59 PM  Addendum: Received call from [facility] in regards to stopping heparin at midnight tonight for colonoscopy 1/13. Will pass along to  2nd shift and nursing.  03/28/2014 2:00 PM

## 2014-03-28 NOTE — Progress Notes (Signed)
Physical Therapy Treatment Patient Details Name: Lindsey [name] MRN: [id number] DOB: 11-Nov-1970 Today's Date: 2014-04-12    History of Present Illness Pt adm with BLE pain and found to have HNP at L5-S1.    PT Comments    Pt very pleasant and moving well but difficulty maintaining precautions throughout. On arrival pt in bed twisting to reach items. Used handout and explained, mobility, posture daily activities and transitions adhering to precautions. Pt reported pain decreased with gait and utilizing back precautions. Pt also educated for and practiced golfers lift x 2. Pt reports understanding but will benefit from additional session to achieve mod I level for return home.   Follow Up Recommendations  Outpatient PT     Equipment Recommendations       Recommendations for Other Services       Precautions / Restrictions Precautions Precautions: Back Precaution Booklet Issued: Yes (comment)    Mobility  Bed Mobility Overal bed mobility: Needs Assistance Bed Mobility: Rolling;Sidelying to Sit Rolling: Supervision Sidelying to sit: Supervision       General bed mobility comments: cues for precautions to protect her back  Transfers Overall transfer level: Modified independent               General transfer comment: cues for posture  Ambulation/Gait Ambulation/Gait assistance: Modified independent (Device/Increase time) Ambulation Distance (Feet): 325 Feet Assistive device: None Gait Pattern/deviations: Step-through pattern;Decreased stride length   Gait velocity interpretation: Below normal speed for age/gender     Stairs Stairs: Yes Stairs assistance: Modified independent (Device/Increase time) Stair Management: One rail Left;Step to pattern;Forwards Number of Stairs: 3 General stair comments: able to complete stairs with cues for sequence  Wheelchair Mobility    Modified Rankin (Stroke Patients Only)       Balance                                     Cognition Arousal/Alertness: Awake/alert Behavior During Therapy: WFL for tasks assessed/performed Overall Cognitive Status: Within Functional Limits for tasks assessed                      Exercises      General Comments        Pertinent Vitals/Pain Pain Score: 4  Pain Location: RLE entire leg Pain Descriptors / Indicators: Aching Pain Intervention(s): Repositioned    Home Living                      Prior Function            PT Goals (current goals can now be found in the care plan section) Progress towards PT goals: Progressing toward goals    Frequency       PT Plan Current plan remains appropriate    Co-evaluation             End of Session   Activity Tolerance: Patient tolerated treatment well Patient left: in chair;with call bell/phone within reach     Time: 1325-1350 PT Time Calculation (min) (ACUTE ONLY): 25 min  Charges:  $Gait Training: 8-22 mins $Therapeutic Activity: 8-22 mins                    G Codes:      Delorse Lek April 12, 2014, 1:54 PM Delaney Meigs, PT [phone]

## 2014-03-28 NOTE — Progress Notes (Signed)
ANTICOAGULATION CONSULT NOTE Pharmacy Consult for Heparin Indication: coagulopathy  No Known Allergies  Patient Measurements: Height: 5\' 6"  (167.6 cm) Weight: 155 lb 9.6 oz (70.58 kg) IBW/kg (Calculated) : 59.3 Heparin Dosing Weight: 70.6 kg  Vital Signs: Temp: 98.5 F (36.9 C) (01/11 2011) Temp Source: Oral (01/11 2011) BP: 134/99 mmHg (01/11 2011) Pulse Rate: 65 (01/11 2011)  Labs:  Recent Labs  03/26/14 1712 03/26/14 1729 03/26/14 2202 03/27/14 0155 03/27/14 0156  03/27/14 0750 03/27/14 1002 03/27/14 1742 03/28/14 0112  HGB 9.9*  --   --   --   --   --   --   --   --   --   HCT 29.8*  --   --   --   --   --   --   --   --   --   PLT 314  --   --   --   --   --   --   --   --   --   APTT  --   --  27  --   --   --   --   --   --   --   LABPROT  --  13.1  --   --  14.6  --   --   --   --   --   INR  --  0.98  --   --  1.12  --   --   --   --   --   HEPARINUNFRC  --   --   --   --   --   < > <0.10*  --  <0.10* 0.21*  CREATININE 0.58  --  0.64  --  0.63  --   --   --   --   --   CKTOTAL 162  --   --   --   --   --   --   --   --   --   TROPONINI  --   --  <0.03 <0.03  --   --   --  <0.03  --   --   < > = values in this interval not displayed.  Estimated Creatinine Clearance: 83.1 mL/min (by C-G formula based on Cr of 0.63).   Assessment: 44 y/o Female with h/o CVA/LV thrombus,  INR subtherapeutic, for heparin  Goal of Therapy:  Heparin level 0.3-0.7 units/ml Monitor platelets by anticoagulation protocol: Yes   Plan:  Increase Heparin 1400 units/hr Check heparin level in 8 hours.   Eddie Candle 03/28/2014,2:21 AM

## 2014-03-29 ENCOUNTER — Ambulatory Visit: Payer: Medicaid Other | Admitting: Neurology

## 2014-03-29 ENCOUNTER — Inpatient Hospital Stay (HOSPITAL_COMMUNITY): Payer: Medicaid Other | Admitting: Anesthesiology

## 2014-03-29 ENCOUNTER — Encounter (HOSPITAL_COMMUNITY)
Admission: EM | Disposition: A | Discharge status: Home / Self Care | Payer: Self-pay | Source: Home / Self Care | Attending: Oncology

## 2014-03-29 ENCOUNTER — Encounter (HOSPITAL_COMMUNITY): Payer: Self-pay | Admitting: *Deleted

## 2014-03-29 DIAGNOSIS — M79609 Pain in unspecified limb: Secondary | ICD-10-CM | POA: Insufficient documentation

## 2014-03-29 DIAGNOSIS — D649 Anemia, unspecified: Secondary | ICD-10-CM

## 2014-03-29 DIAGNOSIS — R079 Chest pain, unspecified: Principal | ICD-10-CM

## 2014-03-29 DIAGNOSIS — I82409 Acute embolism and thrombosis of unspecified deep veins of unspecified lower extremity: Secondary | ICD-10-CM

## 2014-03-29 DIAGNOSIS — I10 Essential (primary) hypertension: Secondary | ICD-10-CM

## 2014-03-29 DIAGNOSIS — M5416 Radiculopathy, lumbar region: Secondary | ICD-10-CM

## 2014-03-29 DIAGNOSIS — D689 Coagulation defect, unspecified: Secondary | ICD-10-CM

## 2014-03-29 DIAGNOSIS — K649 Unspecified hemorrhoids: Secondary | ICD-10-CM

## 2014-03-29 HISTORY — PX: COLONOSCOPY: SHX5424

## 2014-03-29 LAB — CBC
HCT: 30 % — ABNORMAL LOW (ref 36.0–46.0)
Hemoglobin: 9.7 g/dL — ABNORMAL LOW (ref 12.0–15.0)
MCH: 30.9 pg (ref 26.0–34.0)
MCHC: 32.3 g/dL (ref 30.0–36.0)
MCV: 95.5 fL (ref 78.0–100.0)
PLATELETS: 170 10*3/uL (ref 150–400)
RBC: 3.14 MIL/uL — ABNORMAL LOW (ref 3.87–5.11)
RDW: 16.3 % — AB (ref 11.5–15.5)
WBC: 12.9 10*3/uL — ABNORMAL HIGH (ref 4.0–10.5)

## 2014-03-29 LAB — PROTIME-INR
INR: 0.98 (ref 0.00–1.49)
Prothrombin Time: 13.1 seconds (ref 11.6–15.2)

## 2014-03-29 LAB — PROTEIN C ACTIVITY: Protein C Activity: 76 % (ref 74–151)

## 2014-03-29 SURGERY — COLONOSCOPY
Anesthesia: Monitor Anesthesia Care

## 2014-03-29 MED ORDER — LACTATED RINGERS IV SOLN
INTRAVENOUS | Status: DC | PRN
  Administered 2014-03-29: 10:00:00 via INTRAVENOUS

## 2014-03-29 MED ORDER — WARFARIN - PHARMACIST DOSING INPATIENT
Freq: Every day | Status: DC
  Administered 2014-03-30: 18:00:00

## 2014-03-29 MED ORDER — WARFARIN SODIUM 5 MG PO TABS
5.0000 mg | ORAL_TABLET | Freq: Once | ORAL | Status: AC
  Administered 2014-03-29: 5 mg via ORAL
  Filled 2014-03-29: qty 1

## 2014-03-29 MED ORDER — LIDOCAINE HCL (CARDIAC) 20 MG/ML IV SOLN
INTRAVENOUS | Status: DC | PRN
  Administered 2014-03-29: 50 mg via INTRAVENOUS

## 2014-03-29 MED ORDER — MIDAZOLAM HCL 5 MG/5ML IJ SOLN
INTRAMUSCULAR | Status: DC | PRN
  Administered 2014-03-29: 2 mg via INTRAVENOUS

## 2014-03-29 MED ORDER — PROPOFOL 10 MG/ML IV BOLUS
INTRAVENOUS | Status: DC | PRN
  Administered 2014-03-29 (×2): 20 mg via INTRAVENOUS

## 2014-03-29 MED ORDER — LACTATED RINGERS IV SOLN: INTRAVENOUS | Status: DC

## 2014-03-29 MED ORDER — SODIUM CHLORIDE 0.9 % IV SOLN
INTRAVENOUS | Status: DC
  Administered 2014-03-29: 05:00:00 via INTRAVENOUS

## 2014-03-29 MED ORDER — PROPOFOL INFUSION 10 MG/ML OPTIME
INTRAVENOUS | Status: DC | PRN
  Administered 2014-03-29: 120 ug/kg/min via INTRAVENOUS

## 2014-03-29 MED ORDER — HEPARIN (PORCINE) IN NACL 100-0.45 UNIT/ML-% IJ SOLN
1700.0000 [IU]/h | INTRAMUSCULAR | Status: DC
  Administered 2014-03-29: 1500 [IU]/h via INTRAVENOUS
  Administered 2014-03-30: 1700 [IU]/h via INTRAVENOUS
  Filled 2014-03-29 (×3): qty 250

## 2014-03-29 MED ORDER — FENTANYL CITRATE 0.05 MG/ML IJ SOLN
INTRAMUSCULAR | Status: DC | PRN
  Administered 2014-03-29 (×2): 50 ug via INTRAVENOUS

## 2014-03-29 MED ORDER — HEPARIN (PORCINE) IN NACL 100-0.45 UNIT/ML-% IJ SOLN
1500.0000 [IU]/h | INTRAMUSCULAR | Status: DC
  Filled 2014-03-29: qty 250

## 2014-03-29 NOTE — Progress Notes (Signed)
Subjective:  Patient was seen and examined after colonoscopy. Patient continues to have pain and weakness secondary to pain in lower extremities. She also admits to BRBPR last night and this morning. She denies any dizziness, chest pain, shortness of breath, nausea, vomiting, abdominal pain. She is hungry and requests to eat.   Objective: Vital signs in last 24 hours: Filed Vitals:   03/29/14 1118 03/29/14 1120 03/29/14 1130 03/29/14 1151  BP: 131/88 121/82 132/80 136/86  Pulse: 65 55 51 51  Temp: 97.5 F (36.4 C)     TempSrc: Oral     Resp: Height:      Weight:      SpO2: 99% 98% 99% 100%   Weight change:   Intake/Output Summary (Last 24 hours) at 03/29/14 1221 Last data filed at 03/29/14 1111  Gross per 24 hour  Intake    590 ml  Output      0 ml  Net    590 ml   General: Vital signs reviewed.  Patient is well-developed and well-nourished, in no acute distress and cooperative with exam.  Cardiovascular: RRR, S1 normal, S2 normal, no murmurs, gallops, or rubs. Pulmonary/Chest: Clear to auscultation bilaterally, no wheezes, rales, or rhonchi Abdominal: Soft, non-tender, non-distended, BS +, no guarding Extremities: No lower extremity edema bilaterally, no increased warmth. Diffuse tenderness on palpation of lower extremities.   Pulses symmetric and intact bilaterally. Strength is 5/5 in left lower extremity and 4/5 with plantar flexion and hip extension, 3/5 with dorsiflexion and hip flexion. Strength is limited by pain. Skin: Warm, dry and intact. No rashes or erythema. Psychiatric: Normal mood and affect. speech and behavior is normal. Cognition and memory are normal.   Lab Results: Basic Metabolic Panel:  Recent Labs Lab 03/26/14 2202 03/27/14 0156 03/28/14 0459  NA 143 138 136  K 3.7 3.3* 3.6  CL 107 104 101  CO2 GLUCOSE 96 95 109*  BUN 5* 5* 5*  CREATININE 0.64 0.63 0.73  CALCIUM 8.9 8.5 8.8  MG 1.6  --   --   PHOS 3.2  --   --     Liver Function Tests:  Recent Labs Lab 03/26/14 2202  AST 44*  ALT 22  ALKPHOS 95  BILITOT 0.8  PROT 6.6  ALBUMIN 3.2*   CBC:  Recent Labs Lab 03/28/14 0459 03/29/14 0357  WBC 10.0 12.9*  HGB 10.4* 9.7*  HCT 32.1* 30.0*  MCV 97.6 95.5  PLT 187 170   Cardiac Enzymes:  Recent Labs Lab 03/26/14 1712 03/26/14 2202 03/27/14 0155 03/27/14 1002  CKTOTAL 162  --   --   --   TROPONINI  --  <0.03 <0.03 <0.03   Coagulation:  Recent Labs Lab 03/26/14 1729 03/27/14 0156 03/28/14 0459 03/29/14 0357  LABPROT 13.1 14.6 13.1 13.1  INR 0.98 1.12 0.99 0.98   Anemia Panel:  Recent Labs Lab 03/26/14 2202  VITAMINB12 186*  FOLATE 3.6  FERRITIN 54  TIBC 337  IRON 251*  RETICCTPCT 1.6   Urine Drug Screen: Drugs of Abuse     Component Value Date/Time   LABOPIA NONE DETECTED 03/26/2014 1755   COCAINSCRNUR NONE DETECTED 03/26/2014 1755   LABBENZ NONE DETECTED 03/26/2014 1755   AMPHETMU NONE DETECTED 03/26/2014 1755   THCU NONE DETECTED 03/26/2014 1755   LABBARB NONE DETECTED 03/26/2014 1755    Alcohol Level:  Recent Labs Lab 03/26/14 2202  ETH <5   Micro Results: Recent Results (  from the past 240 hour(s))  MRSA PCR Screening     Status: None   Collection Time: 03/26/14 10:23 PM  Result Value Ref Range Status   MRSA by PCR NEGATIVE NEGATIVE Final    Comment:        The GeneXpert MRSA Assay (FDA approved for NASAL specimens only), is one component of a comprehensive MRSA colonization surveillance program. It is not intended to diagnose MRSA infection nor to guide or monitor treatment for MRSA infections.    Studies/Results: No results found. Medications: I have reviewed the patient's current medications. Prior to Admission:  Prescriptions prior to admission  Medication Sig Dispense Refill Last Dose  . acetaminophen (TYLENOL) 325 MG tablet Take 2 tablets (650 mg total) by mouth every 6 (six) hours as needed for moderate pain, fever or  headache. 30 tablet 0 2 weeks  . atorvastatin (LIPITOR) 40 MG tablet Take 2 tablets (80 mg total) by mouth daily. (Patient taking differently: Take 80 mg by mouth at bedtime. ) 90 tablet 11 03/25/2014 at Unknown time  . ibuprofen (ADVIL,MOTRIN) 600 MG tablet Take 1 tablet (600 mg total) by mouth every 6 (six) hours as needed for headache. 30 tablet 0 unknown  . metoprolol succinate (TOPROL XL) 25 MG 24 hr tablet Take 1 tablet (25 mg total) by mouth daily. 30 tablet 12 03/26/2014 at 0900  . nitroGLYCERIN (NITROSTAT) 0.4 MG SL tablet Place 1 tablet (0.4 mg total) under the tongue every 5 (five) minutes as needed for chest pain (up to 3 doses). 25 tablet 3 unknown  . warfarin (COUMADIN) 5 MG tablet Take 1.5 tablets (7.5 mg total) by mouth daily. 42 tablet 0 03/23/2014   Scheduled Meds: . atorvastatin  80 mg Oral q1800  . hydrocortisone  25 mg Rectal BID  . metoprolol succinate  25 mg Oral Daily  . pantoprazole  40 mg Oral Daily  . predniSONE  60 mg Oral Q breakfast  . sodium chloride  3 mL Intravenous Q12H   Continuous Infusions:   PRN Meds:.acetaminophen, albuterol, HYDROmorphone (DILAUDID) injection, promethazine Assessment/Plan: Principal Problem:   Chest pain Active Problems:   Hyperlipemia   Essential hypertension   Coronary Artery Disease   Paresthesias   Antithrombin III deficiency   Apical mural thrombus   Long term current use of anticoagulant therapy   History of ischemic left PCA stroke   Bilateral lower extremity pain   Elevated AST (SGOT)   Radiculopathy of lumbosacral region   Lumbar herniated disc   Rectal bleeding   Pain in limb   Bleeding hemorrhoids  Lumbar Radiculopathy: Pain and strength minimally improved with morphine and prednisone. Patient will need outpatient follow up to assess improvement in 2 weeks.  -PT/OT -Morphine 1 mg IV Q4H prn -Prednisone 60 mg daily for 5-7 days -No NSAIDs in setting of gastritis  Coagulopathy Disorder: Protein C activity normal  at 76%.  -Heparin per pharmacy -Hold coumadin in setting of rectal bleeding -Protein C total in process  Normocytic Anemia: Hemoglobin stable 9.9>10.4>9.7. Heparin was stopped at midnight for colonoscopy today. Colonoscopy was significant for internal hemorrhoids. Gastroenterology is recommending surgery consult for evaluation for hemorrhoidectomy in setting of long term anticoagulation. I will place consult.  -Hold Coumadin -Restart heparin gtt -Repeat CBC tomorrow am  -Protonix 40 mg daily -Resume heart healthy diet -Hydrocortisone (Anusol) suppositories 25 mg BID  HTN: BP controlled 130-150s/80-90s. On metoprolol 25 mg po daily at home.  -Continue metoprolol 25 mg po daily  DVT/PE ppxx: Heparin  gtt (held for colonoscopy)  Dispo: Disposition is deferred at this time, awaiting improvement of current medical problems.  Anticipated discharge in approximately 1-2 day(s).   The patient does have a current PCP (Ejiroghene Wendall Stade, MD) and does need an [facility] hospital follow-up appointment after discharge.  The patient does not have transportation limitations that hinder transportation to clinic appointments.  .Services Needed at time of discharge: Y = Yes, Blank = No PT:   OT:   RN:   Equipment:   Other:     LOS: 3 days   Jill Alexanders, DO PGY-1 Internal Medicine Resident Pager # [phone] 03/29/2014 12:21 PM

## 2014-03-29 NOTE — Interval H&P Note (Signed)
History and Physical Interval Note:  03/29/2014 10:38 AM  Lindsey [name]  has presented today for surgery, with the diagnosis of rectal bleeding  The various methods of treatment have been discussed with the patient and family. After consideration of risks, benefits and other options for treatment, the patient has consented to  Procedure(s): COLONOSCOPY (N/A) as a surgical intervention .  The patient's history has been reviewed, patient examined, no change in status, stable for surgery.  I have reviewed the patient's chart and labs.  Questions were answered to the patient's satisfaction.    The recent H&P (dated *03/28/14**) was reviewed, the patient was examined and there is no change in the patients condition since that H&P was completed.   Lindsey [name]  03/29/2014, 10:38 AM    Lindsey [name]

## 2014-03-29 NOTE — Anesthesia Preprocedure Evaluation (Addendum)
Anesthesia Evaluation  Patient identified by MRN, date of birth, ID band Patient awake    Reviewed: Allergy & Precautions, NPO status , reviewed documented beta blocker date and time   History of Anesthesia Complications Negative for: history of anesthetic complications  Airway Mallampati: II  TM Distance: >3 FB Neck ROM: Full    Dental  (+) Teeth Intact, Dental Advisory Given   Pulmonary Current Smoker,    Pulmonary exam normal       Cardiovascular hypertension, + CAD, + Past MI and + Cardiac Stents     Neuro/Psych PSYCHIATRIC DISORDERS Anxiety Depression TIACVA, Residual Symptoms    GI/Hepatic Neg liver ROS, hiatal hernia,   Endo/Other  negative endocrine ROS  Renal/GU negative Renal ROS     Musculoskeletal   Abdominal   Peds  Hematology  (+) anemia ,   Anesthesia Other Findings   Reproductive/Obstetrics                           Anesthesia Physical Anesthesia Plan  ASA: III  Anesthesia Plan: MAC   Post-op Pain Management:    Induction: Intravenous  Airway Management Planned: Simple Face Mask  Additional Equipment:   Intra-op Plan:   Post-operative Plan:   Informed Consent: I have reviewed the patients History and Physical, chart, labs and discussed the procedure including the risks, benefits and alternatives for the proposed anesthesia with the patient or authorized representative who has indicated his/her understanding and acceptance.   Dental advisory given  Plan Discussed with: CRNA, Anesthesiologist and Surgeon  Anesthesia Plan Comments:         Anesthesia Quick Evaluation

## 2014-03-29 NOTE — Progress Notes (Signed)
She is bleeding from hemorrhoids.  Since she is facing long-term anticoagulation therapy would ask surgery to evaluate for consideration of hemorrhoidectomy.  Signing off

## 2014-03-29 NOTE — Transfer of Care (Signed)
Immediate Anesthesia Transfer of Care Note  Patient: Lindsey [name]  Procedure(s) Performed: Procedure(s): COLONOSCOPY (N/A)  Patient Location: Endoscopy Unit  Anesthesia Type:MAC  Level of Consciousness: awake, alert , oriented and patient cooperative  Airway & Oxygen Therapy: Patient Spontanous Breathing  Post-op Assessment: Report given to PACU RN and Post -op Vital signs reviewed and stable  Post vital signs: Reviewed and stable  Complications: No apparent anesthesia complications

## 2014-03-29 NOTE — Progress Notes (Addendum)
ANTICOAGULATION CONSULT NOTE Pharmacy Consult for Heparin/warfarin  Indication: coagulopathy  No Known Allergies  Patient Measurements: Height: 5\' 6"  (167.6 cm) Weight: 155 lb 9.6 oz (70.58 kg) IBW/kg (Calculated) : 59.3 Heparin Dosing Weight: 70.6 kg  Vital Signs: Temp: 97.5 F (36.4 C) (01/13 1118) Temp Source: Oral (01/13 1118) BP: 136/86 mmHg (01/13 1151) Pulse Rate: 60 (01/13 1244)  Labs:  Recent Labs  03/26/14 1712  03/26/14 2202 03/27/14 0155 03/27/14 0156  03/27/14 1002  03/28/14 0459 03/28/14 0940 03/28/14 1957 03/29/14 0357  HGB 9.9*  --   --   --   --   --   --   --  10.4*  --   --  9.7*  HCT 29.8*  --   --   --   --   --   --   --  32.1*  --   --  30.0*  PLT 314  --   --   --   --   --   --   --  187  --   --  170  APTT  --   --  27  --   --   --   --   --   --   --   --   --   LABPROT  --   < >  --   --  14.6  --   --   --  13.1  --   --  13.1  INR  --   < >  --   --  1.12  --   --   --  0.99  --   --  0.98  HEPARINUNFRC  --   --   --   --   --   < >  --   < > 0.30 0.20* 0.63  --   CREATININE 0.58  --  0.64  --  0.63  --   --   --  0.73  --   --   --   CKTOTAL 162  --   --   --   --   --   --   --   --   --   --   --   TROPONINI  --   --  <0.03 <0.03  --   --  <0.03  --   --   --   --   --   < > = values in this interval not displayed.  Estimated Creatinine Clearance: 83.1 mL/min (by C-G formula based on Cr of 0.73).   Assessment: 44 y/o Female with h/o CVA/LV thrombus, INR subtherapeutic, for heparin while procedures are planned.   Colonoscopy showed bleeding from hemorrhoids, orders to resume heparin/warfarin. Will wait 6 hours post procedure to restart. BRBPR noted last night and this morning.   INR 0.9 this am.  Goal of Therapy:  INR goal  Heparin level 0.3-0.7 units/ml Monitor platelets by anticoagulation protocol: Yes   Plan:  Warfarin 5mg  tonight Restart heparin at 1500 units/hr HL in 6 hours Daily cbc/hl  Sheppard Coil PharmD.,  BCPS Clinical Pharmacist Pager [phone] 03/29/2014 1:27 PM

## 2014-03-29 NOTE — H&P (View-Only) (Signed)
[facility] Consult: 9:30 AM 03/28/2014  LOS: 2 days    Referring Provider: Dr.Grqnfortuna  Primary Care Physician:  Lindsey Carina, MD Primary Gastroenterologist:  Gentry Fitz.  Seen by Acquanetta Belling in past      Reason for Consultation:  Bleeding per rectum   HPI: Lindsey [name] is a 44 y.o. female.  Hx CVA, MI, CAD, MI at age 38.  Bare metal cardiac stent 2009.  Clinically appears to have hypercoagulable disorder, though exact type remains elusive after many labs.  ETOH pancreatitis 2012, 05/2011, 07/2013.  No GB disease or biliary tree pathology on 12/2010 ultrasound or CT but it did show fatty liver. On Coumadin for left ventricuar thrombus. In early 02/2014, while in [place] and temporarily off Coumadin for dental procedure had left sided numbness, blurry vision, headache.  CT showed previous infarct, nothing acute.   Hx intermittent blood in stools.  She is currently FOBT +.  Hgb is 10.4, baseline is ~9.5-11.5, was 11.0 on 03/15/14. PT 13.1, INR 0.99. For about 4 months the patient has been seeing small to moderate amounts of blood when she has a bowel movement. This occurs with most of her bowel movements though sometimes she'll have nonbloody bowel movements. The stool itself is brown. The blood will be clotted.  There is no pain in her rectum. She previously had problems with constipation but that has resolved. It is easy for her to pass her stools. Sometimes she has loose stools, a couple of weeks ago she did have several diarrheal stools which has resolved. Her appetite is stable. She has no heartburn or indigestion. There is no abdominal pain. Last week she did take 1200 mg of ibuprofen on one occasion to deal with pain she has in her legs.  Besides this she has not been using NSAIDs and she  does not take aspirin.    ENDOSCOPIC STUDIES: 07/2013  EGD  Dr Russella Dar For hematemesis in setiing of BC powders and inconsistent use of PPI. .  1. LA Class B esophagitis 2. Erosive gastritis in the gastric body 3. Duodenitis in the bulb and second portion of the duodenum 4. Small hiatal hernia  05/2012  EGD  Dr Elnoria Howard For hematemesis.  2 cm HH otherwise normal study.   12/2007 Colonoscopy   Dr Christella Hartigan.  For intermittent rectal bleeding, IDA Showed diverticulosis and external rrhoids   Past Medical History  Diagnosis Date  . Stroke     assoc with short term memory loss and right peripheral vision loss; age 25   . Blood transfusion   . Anxiety   . Coronary artery disease     Apical LAD infarction '00; NSTEMI s/p BMS to prox LAD '09; Cath 12/2011 single vessel CAD w/ patent LAD stent w/ stable mild ISR, nl LV systolic fxn  . Anemia   . Antithrombin III deficiency     ?pt not sure if true diagnosis  . Tobacco abuse   . High blood pressure   . Myocardial infarction     Past Surgical History  Procedure Laterality  Date  . Tubal ligation    . Cardiac catheterization    . Esophagogastroduodenoscopy N/A 06/09/2012    Procedure: ESOPHAGOGASTRODUODENOSCOPY (EGD);  Surgeon: Theda Belfast, MD;  Location: [facility];  Service: Endoscopy;  Laterality: N/A;  . Coronary angioplasty    . Esophagogastroduodenoscopy N/A 08/02/2013    Procedure: ESOPHAGOGASTRODUODENOSCOPY (EGD);  Surgeon: Meryl Dare, MD;  Location: [facility] ENDOSCOPY;  Service: Endoscopy;  Laterality: N/A;  . Left heart catheterization with coronary angiogram N/A 12/24/2011    Procedure: LEFT HEART CATHETERIZATION WITH CORONARY ANGIOGRAM;  Surgeon: Kathleene Hazel, MD;  Location: [facility] CATH LAB;  Service: Cardiovascular;  Laterality: N/A;    Prior to Admission medications   Medication Sig Start Date End Date Taking? Authorizing Provider  acetaminophen (TYLENOL) 325 MG tablet Take 2 tablets (650 mg total) by mouth  every 6 (six) hours as needed for moderate pain, fever or headache. 02/21/14  Yes Tasrif Ahmed, MD  atorvastatin (LIPITOR) 40 MG tablet Take 2 tablets (80 mg total) by mouth daily. Patient taking differently: Take 80 mg by mouth at bedtime.  02/21/14 02/21/15 Yes Tasrif Ahmed, MD  ibuprofen (ADVIL,MOTRIN) 600 MG tablet Take 1 tablet (600 mg total) by mouth every 6 (six) hours as needed for headache. 02/21/14  Yes Tasrif Ahmed, MD  metoprolol succinate (TOPROL XL) 25 MG 24 hr tablet Take 1 tablet (25 mg total) by mouth daily. 03/03/14  Yes Lewayne Bunting, MD  nitroGLYCERIN (NITROSTAT) 0.4 MG SL tablet Place 1 tablet (0.4 mg total) under the tongue every 5 (five) minutes as needed for chest pain (up to 3 doses). 10/14/13  Yes Beatrice Lecher, PA-C  warfarin (COUMADIN) 5 MG tablet Take 1.5 tablets (7.5 mg total) by mouth daily. 10/19/13  Yes Ejiroghene Wendall Stade, MD    Scheduled Meds: . atorvastatin  80 mg Oral q1800  . metoprolol succinate  25 mg Oral Daily  . pantoprazole  40 mg Oral Daily  . predniSONE  60 mg Oral Q breakfast  . sodium chloride  3 mL Intravenous Q12H   Infusions: . heparin 1,400 Units/hr (03/28/14 0406)   PRN Meds: acetaminophen, albuterol, HYDROmorphone (DILAUDID) injection, promethazine   Allergies as of 03/26/2014  . (No Known Allergies)    Family History  Problem Relation Age of Onset  . Heart disease Brother     arrhythmia; died  . Breast cancer Maternal Aunt     History   Social History  . Marital Status: Divorced    Spouse Name: N/A    Number of Children: 2  . Years of Education: N/A   Occupational History  . Not on file.   Social History Main Topics  . Smoking status: Current Every Day Smoker -- 0.20 packs/day for 1 years    Types: Cigarettes  . Smokeless tobacco: Never Used  . Alcohol Use: 0.0 oz/week    0 Cans of beer per week     Comment: occasion  . Drug Use: Yes    Special: Cocaine     Comment: hx of use x 1  . Sexual Activity: No    Other Topics Concern  . Not on file   Social History Narrative    REVIEW OF SYSTEMS: Constitutional:  Stable weight. Stable energy level. ENT:  No nose bleeds Pulm:  No shortness of breath or cough. No pleuritic pain. CV:  No palpitations, no LE edema. No chest pain GU:  No hematuria, no frequency GI:  No dysphagia, no heartburn, no nausea. No abdominal  pain Heme:  No unusual bleeding from the skin or excessive bruising.   Transfusions:  Has had transfusions in the past but it's been a couple of years at least since her last transfusion Neuro:  bil leg numbness and pain MS: leg pain bil. Derm:  No itching, no rash or sores.  Endocrine:  No sweats or chills.  No polyuria or dysuria Immunization:  Received flu and pneumococcal as well asTdap vaccination in 2015 Travel:  None beyond local counties in last few months.    PHYSICAL EXAM: Vital signs in last 24 hours: Filed Vitals:   03/28/14 0443  BP: 149/90  Pulse: 62  Temp:   Resp: 18   Wt Readings from Last 3 Encounters:  03/27/14 155 lb 9.6 oz (70.58 kg)  03/03/14 166 lb 11.2 oz (75.615 kg)  02/19/14 164 lb (74.39 kg)    General: Looks well, overweight, comfortable Head:  No asymmetry or facial edema. No head trauma  Eyes:  No scleral icterus or conjunctival pallor Ears:  No hearing deficit  Nose:  No congestion or discharge Mouth:  Clear, moist, dentition in good repair. Neck:  No JVD, thyromegaly or masses Lungs:  Clear to auscultation and percussion bilaterally. No cough. No dyspnea Heart: RRR. No MRG. S1/S2 audible Abdomen:  Soft, NT, ND.  No organomegaly. No bruits. No masses.   Rectal: Palpable internal hemorrhoids. External hemorrhoidal tags. No blood or stool on exam glove.   Musc/Skeltl: No joint swelling contractures or deformities. Extremities:  No CCE.  Neurologic:  Oriented 3. Alert. Good historian. No limb weakness or asymmetric strength. No tremor Skin:  No rash, AVMs or sores. Tattoos:  None    Psych:  Pleasant, cooperative. In good spirits.  Intake/Output from previous day: 01/11 0701 - 01/12 0700 In: 240 [P.O.:240] Out: -  Intake/Output this shift:    LAB RESULTS:  Recent Labs  03/26/14 1712 03/28/14 0459  WBC 5.1 10.0  HGB 9.9* 10.4*  HCT 29.8* 32.1*  PLT 314 187  MCV     97  BMET Lab Results  Component Value Date   NA 136 03/28/2014   NA 138 03/27/2014   NA 143 03/26/2014   K 3.6 03/28/2014   K 3.3* 03/27/2014   K 3.7 03/26/2014   CL 101 03/28/2014   CL 104 03/27/2014   CL 107 03/26/2014   CO2 28 03/28/2014   CO2 24 03/27/2014   CO2 22 03/26/2014   GLUCOSE 109* 03/28/2014   GLUCOSE 95 03/27/2014   GLUCOSE 96 03/26/2014   BUN 5* 03/28/2014   BUN 5* 03/27/2014   BUN 5* 03/26/2014   CREATININE 0.73 03/28/2014   CREATININE 0.63 03/27/2014   CREATININE 0.64 03/26/2014   CALCIUM 8.8 03/28/2014   CALCIUM 8.5 03/27/2014   CALCIUM 8.9 03/26/2014   LFT  Recent Labs  03/26/14 2202  PROT 6.6  ALBUMIN 3.2*  AST 44*  ALT 22  ALKPHOS 95  BILITOT 0.8   PT/INR Lab Results  Component Value Date   INR 0.99 03/28/2014   INR 1.12 03/27/2014   INR 0.98 03/26/2014   Hepatitis Panel  Recent Labs  03/27/14 0156  HEPBSAG NEGATIVE  HCVAB NEGATIVE  HEPAIGM NON REACTIVE  HEPBIGM NON REACTIVE   C-Diff No components found for: CDIFF Lipase     Component Value Date/Time   LIPASE 629* 08/01/2013 0937    Drugs of Abuse     Component Value Date/Time   LABOPIA NONE DETECTED 03/26/2014 1755   COCAINSCRNUR NONE  DETECTED 03/26/2014 1755   LABBENZ NONE DETECTED 03/26/2014 1755   AMPHETMU NONE DETECTED 03/26/2014 1755   THCU NONE DETECTED 03/26/2014 1755   LABBARB NONE DETECTED 03/26/2014 1755     RADIOLOGY STUDIES: Dg Chest 2 View  03/26/2014   CLINICAL DATA:  Centralized chest pain.  EXAM: CHEST  2 VIEW  COMPARISON:  Radiographs and CT 03/15/2014  FINDINGS: The cardiomediastinal contours are normal. Cardiac stent is seen. The lungs are  clear. Pulmonary vasculature is normal. No consolidation, pleural effusion, or pneumothorax. No acute osseous abnormalities are seen.  IMPRESSION: No acute pulmonary process.   Electronically Signed   By: Rubye Oaks M.D.   On: 03/26/2014 19:05   Mr Lumbar Spine Wo Contrast  03/27/2014   CLINICAL DATA:  Back pain for 2 weeks, increasing in severity, difficulty ambulating.  EXAM: MRI LUMBAR SPINE WITHOUT CONTRAST  TECHNIQUE: Multiplanar, multisequence MR imaging of the lumbar spine was performed. No intravenous contrast was administered.  COMPARISON:  CT of the abdomen and pelvis October 30, 2013  FINDINGS: Lumbar vertebral bodies and posterior elements are intact and aligned with maintenance of lumbar lordosis. Using the reference level of the last well-formed intervertebral disc as L5-S1, mild L4-5 and L5-S1 disc height loss, is slight desiccation these 2 discs. Minimal acute discogenic endplate changes superior posterior S1.  Conus medullaris terminates at L1-2 and appears normal morphology and signal characteristics. Cauda equina is unremarkable. The RIGHT psoas muscle is slightly smaller than the LEFT, recommend correlation with ambulation.  Level by level evaluation:  L1-2, L2-3: No disc bulge, canal stenosis nor neural foraminal narrowing.  L3-4: 2 mm broad-based disc bulge asymmetric to the RIGHT. Mild facet arthropathy and ligamentum flavum redundancy. No canal stenosis. Mild to moderate RIGHT, mild LEFT neural foraminal narrowing.  L4-5: 2-3 mm broad-based disc bulge, mild facet arthropathy and ligamentum flavum redundancy without canal stenosis. Moderate LEFT greater than RIGHT neural foraminal narrowing.  L5-S1: LEFT central approximate 7 x 4 mm (transverse by AP) disc extrusion with 7 mm of caudal subligamentous contiguous extent, contacting the traversing LEFT S1 nerve within the lateral recess. Intermediate to bright STIR signal within the extruded material suggest acuity. Mild canal stenosis.  Mild bilateral neural foraminal narrowing.  IMPRESSION: Approximate 7 x 4 x 7 mm LEFT central L5-S1 acute appearing contiguous disc extrusion contacting the traversing LEFT S1 nerve, resulting mild canal stenosis.  No acute fracture nor malalignment.  L4-5 and L5-S1 neural foraminal narrowing: Moderate at L4-5.   Electronically Signed   By: Awilda Metro   On: 03/27/2014 03:52      IMPRESSION:   *  Mild-to-moderate rectal bleeding for at least 4 months. Suspect this is due to known hemorrhoids seen on exam today as well as on colonoscopy in 2009. Also known to have diverticulosis but diverticular bleed would normally be more dramatic than what she is describing. Fortunately the bleeding is not associated with worsening of her chronic anemia.  *  Chronic Coumadin therapy for hypercoagulable disorder and left ventricular thrombus. Her PT/INR are actually subtherapeutic.  *  Bilateral leg pain. MRI of spine confirms L5/S1 herniated disc and associated stenosis.  *  History esophagitis, gastritis, duodenitis and hiatal hernia. Currently these are asymptomatic, she is compliant with daily PPI. Had recently used a couple of ibuprofen but do not think this has any correlation with her rectal bleeding.    PLAN:     *  Will add twice a day rectal suppositories. As 2 flexible  sigmoidoscopy versus colonoscopy and therapy to hemorrhoids will defer that decision to Dr. Arlyce Dice.  *  Reminded patient to remain compliant with PPI and to avoid ibuprofen.   Jennye Moccasin  03/28/2014, 9:30 AM Pager: [phone]  GI Attending Note   Chart was reviewed and patient was examined. X-rays and lab were reviewed.   Persistent rectal bleeding likely is hemorrhoidal.  At the same time, I think it is prudent to rule out a more proximal colonic bleeding source since she will remain on long-term anticoagulation therapy.  Accordingly, I will proceed with colonoscopy.  Should no abnormalities be seen but hemorrhoids,  would consider evaluation by general surgery for hemorrhoidectomy. Barbette Hair. Arlyce Dice, M.D., [facility] [phone]

## 2014-03-29 NOTE — Op Note (Signed)
[facility] [address] [place], [address]   COLONOSCOPY PROCEDURE REPORT     EXAM DATE: 04-09-2014  PATIENT NAME:      Lindsey [name], Lindsey [name]           MR #:      [id number]  BIRTHDATE:       12-11-1970      VISIT #:     [id number]  ATTENDING:     Louis Meckel, MD     STATUS:     inpatient REFERRING MD: ASA CLASS:        Class II  INDICATIONS:  The patient is a 44 yr old female here for a colonoscopy due to hematochezia. PROCEDURE PERFORMED:     Colonoscopy, diagnostic MEDICATIONS:     Monitored anesthesia care ESTIMATED BLOOD LOSS:     None  CONSENT: The patient understands the risks and benefits of the procedure and understands that these risks include, but are not limited to: sedation, allergic reaction, infection, perforation and/or bleeding. Alternative means of evaluation and treatment include, among others: physical exam, x-rays, and/or surgical intervention. The patient elects to proceed with this endoscopic procedure.  DESCRIPTION OF PROCEDURE: During intra-op preparation period all mechanical & medical equipment was checked for proper function. Hand hygiene and appropriate measures for infection prevention was taken. After the risks, benefits and alternatives of the procedure were thoroughly explained, Informed consent was verified, confirmed and timeout was successfully executed by the treatment team. A digital exam revealed hemorrhoids, Grade IV Fresh blood.      The [employer] Adult Colon [id number] endoscope was introduced through the anus and advanced to the cecum, which was identified by both the appendix and ileocecal valve. No adverse events experienced. The prep was excellent, using MiraLax. The instrument was then slowly withdrawn as the colon was fully examined.   COLON FINDINGS: Internal hemorrhoids were found.   There was mild diverticulosis noted in the ascending colon.   The examination was otherwise normal.  Retroflexed views revealed internal hemorrhoids. The scope was then completely withdrawn from the patient and the procedure terminated. WITHDRAWAL TIME: 8 minutes 0 seconds    ADVERSE EVENTS:      There were no immediate complications.  IMPRESSIONS:     1.  Internal hemorrhoids with bleeding 2.  Mild diverticulosis was noted in the ascending colon 3.  The examination was otherwise normal  RECOMMENDATIONS:     Surgical evaluation for consideration of hemorrhoidectomy RECALL:  Louis Meckel, MD eSigned:  Louis Meckel, MD 04-09-14 11:14 AM   cc:  CPT CODES: ICD CODES:  The ICD and CPT codes recommended by this software are interpretations from the data that the clinical staff has captured with the software.  The verification of the translation of this report to the ICD and CPT codes and modifiers is the sole responsibility of the health care institution and practicing physician where this report was generated.  [employer] will not be held responsible for the validity of the ICD and CPT codes included on this report.  AMA assumes no liability for data contained or not contained herein. CPT is a Publishing rights manager of the [employer].   PATIENT NAME:  Lindsey [name], Lindsey [name] MR#: [id number]

## 2014-03-29 NOTE — Anesthesia Postprocedure Evaluation (Signed)
Anesthesia Post Note  Patient: Lindsey [name]  Procedure(s) Performed: Procedure(s) (LRB): COLONOSCOPY (N/A)  Anesthesia type: MAC  Patient location: PACU  Post pain: Pain level controlled  Post assessment: Patient's Cardiovascular Status Stable  Last Vitals:  Filed Vitals:   03/29/14 1151  BP: 136/86  Pulse: 51  Temp:   Resp:     Post vital signs: Reviewed and stable  Level of consciousness: sedated  Complications: No apparent anesthesia complications

## 2014-03-30 ENCOUNTER — Ambulatory Visit: Payer: Medicaid Other | Admitting: Cardiology

## 2014-03-30 ENCOUNTER — Encounter (HOSPITAL_COMMUNITY): Payer: Self-pay | Admitting: Gastroenterology

## 2014-03-30 LAB — PROTIME-INR
INR: 1.01 (ref 0.00–1.49)
PROTHROMBIN TIME: 13.4 s (ref 11.6–15.2)

## 2014-03-30 LAB — CBC
HCT: 28.9 % — ABNORMAL LOW (ref 36.0–46.0)
Hemoglobin: 9.4 g/dL — ABNORMAL LOW (ref 12.0–15.0)
MCH: 31.9 pg (ref 26.0–34.0)
MCHC: 32.5 g/dL (ref 30.0–36.0)
MCV: 98 fL (ref 78.0–100.0)
Platelets: 160 10*3/uL (ref 150–400)
RBC: 2.95 MIL/uL — ABNORMAL LOW (ref 3.87–5.11)
RDW: 16 % — ABNORMAL HIGH (ref 11.5–15.5)
WBC: 9 10*3/uL (ref 4.0–10.5)

## 2014-03-30 LAB — HEPARIN LEVEL (UNFRACTIONATED): HEPARIN UNFRACTIONATED: 0.23 [IU]/mL — AB (ref 0.30–0.70)

## 2014-03-30 LAB — PROTEIN C, TOTAL: PROTEIN C, TOTAL: 69 % — AB (ref 72–160)

## 2014-03-30 MED ORDER — ENOXAPARIN SODIUM 100 MG/ML ~~LOC~~ SOLN: 1.5000 mg/kg | SUBCUTANEOUS | Status: DC

## 2014-03-30 MED ORDER — HYDROCORTISONE ACETATE 25 MG RE SUPP: 25.0000 mg | Freq: Two times a day (BID) | RECTAL | Status: DC

## 2014-03-30 MED ORDER — PREDNISONE 20 MG PO TABS: 60.0000 mg | ORAL_TABLET | Freq: Every day | ORAL | Status: DC

## 2014-03-30 MED ORDER — WARFARIN SODIUM 7.5 MG PO TABS
7.5000 mg | ORAL_TABLET | Freq: Once | ORAL | Status: AC
  Administered 2014-03-30: 7.5 mg via ORAL
  Filled 2014-03-30: qty 1

## 2014-03-30 MED ORDER — WARFARIN SODIUM 7.5 MG PO TABS: 7.5000 mg | ORAL_TABLET | Freq: Once | ORAL | Status: DC

## 2014-03-30 MED ORDER — PANTOPRAZOLE SODIUM 40 MG PO TBEC: 40.0000 mg | DELAYED_RELEASE_TABLET | Freq: Every day | ORAL | Status: DC

## 2014-03-30 MED ORDER — OXYCODONE-ACETAMINOPHEN 5-325 MG PO TABS: 1.0000 | ORAL_TABLET | ORAL | Status: DC | PRN

## 2014-03-30 MED ORDER — HYDROCODONE-ACETAMINOPHEN 5-325 MG PO TABS
1.0000 | ORAL_TABLET | ORAL | Status: DC | PRN
  Administered 2014-03-30: 2 via ORAL
  Filled 2014-03-30: qty 2

## 2014-03-30 NOTE — Progress Notes (Signed)
Subjective:  Patient was seen and examined this morning. Patient continues to have bilaterally lower extremity pain with ambulation and on palpation, but improved from prior. She denies any chest pain, shortness of breath, nausea or vomiting. Patient does not have lower back pain.   Objective: Vital signs in last 24 hours: Filed Vitals:   03/29/14 2021 03/30/14 0508 03/30/14 1005 03/30/14 1326  BP: 126/76 131/88 120/88 134/86  Pulse: 63 62 62 65  Temp: 98.4 F (36.9 C) 98.2 F (36.8 C)  98.1 F (36.7 C)  TempSrc: Oral Oral  Oral  Resp: 18 18  19   Height:      Weight:      SpO2: 100% 100%  100%   Weight change:   Intake/Output Summary (Last 24 hours) at 03/30/14 1436 Last data filed at 03/30/14 1325  Gross per 24 hour  Intake    970 ml  Output      0 ml  Net    970 ml   General: Vital signs reviewed.  Patient is a very pleasant female, well-developed and well-nourished, in no acute distress and cooperative with exam.  Cardiovascular: RRR, S1 normal, S2 normal, no murmurs, gallops, or rubs. Pulmonary/Chest: Clear to auscultation bilaterally, no wheezes, rales, or rhonchi Abdominal: Soft, non-tender, non-distended, BS +, no guarding Extremities: No lower extremity edema bilaterally. Lower extremities are TTP. Pulses symmetric and intact bilaterally. Strength is 5/5 in left lower extremity and 4/5 with plantar flexion and hip extension, 4/5 with dorsiflexion and hip flexion.  Skin: Warm, dry and intact. No rashes or erythema. Psychiatric: Normal mood and affect. speech and behavior is normal. Cognition and memory are normal.   Lab Results: Basic Metabolic Panel:  Recent Labs Lab 03/26/14 2202 03/27/14 0156 03/28/14 0459  NA 143 138 136  K 3.7 3.3* 3.6  CL 107 104 101  CO2 22 24 28   GLUCOSE 96 95 109*  BUN 5* 5* 5*  CREATININE 0.64 0.63 0.73  CALCIUM 8.9 8.5 8.8  MG 1.6  --   --   PHOS 3.2  --   --    Liver Function Tests:  Recent Labs Lab 03/26/14 2202    AST 44*  ALT 22  ALKPHOS 95  BILITOT 0.8  PROT 6.6  ALBUMIN 3.2*   CBC:  Recent Labs Lab 03/29/14 0357 03/30/14 0500  WBC 12.9* 9.0  HGB 9.7* 9.4*  HCT 30.0* 28.9*  MCV 95.5 98.0  PLT 170 160   Cardiac Enzymes:  Recent Labs Lab 03/26/14 1712 03/26/14 2202 03/27/14 0155 03/27/14 1002  CKTOTAL 162  --   --   --   TROPONINI  --  <0.03 <0.03 <0.03   Coagulation:  Recent Labs Lab 03/27/14 0156 03/28/14 0459 03/29/14 0357 03/30/14 0500  LABPROT 14.6 13.1 13.1 13.4  INR 1.12 0.99 0.98 1.01   Anemia Panel:  Recent Labs Lab 03/26/14 2202  VITAMINB12 186*  FOLATE 3.6  FERRITIN 54  TIBC 337  IRON 251*  RETICCTPCT 1.6   Urine Drug Screen: Drugs of Abuse     Component Value Date/Time   LABOPIA NONE DETECTED 03/26/2014 1755   COCAINSCRNUR NONE DETECTED 03/26/2014 1755   LABBENZ NONE DETECTED 03/26/2014 1755   AMPHETMU NONE DETECTED 03/26/2014 1755   THCU NONE DETECTED 03/26/2014 1755   LABBARB NONE DETECTED 03/26/2014 1755    Alcohol Level:  Recent Labs Lab 03/26/14 2202  ETH <5   Micro Results: Recent Results (from the past 240 hour(s))  MRSA PCR Screening  Status: None   Collection Time: 03/26/14 10:23 PM  Result Value Ref Range Status   MRSA by PCR NEGATIVE NEGATIVE Final    Comment:        The GeneXpert MRSA Assay (FDA approved for NASAL specimens only), is one component of a comprehensive MRSA colonization surveillance program. It is not intended to diagnose MRSA infection nor to guide or monitor treatment for MRSA infections.    Studies/Results: No results found. Medications: I have reviewed the patient's current medications. Prior to Admission:  Prescriptions prior to admission  Medication Sig Dispense Refill Last Dose  . acetaminophen (TYLENOL) 325 MG tablet Take 2 tablets (650 mg total) by mouth every 6 (six) hours as needed for moderate pain, fever or headache. 30 tablet 0 2 weeks  . atorvastatin (LIPITOR) 40 MG tablet  Take 2 tablets (80 mg total) by mouth daily. (Patient taking differently: Take 80 mg by mouth at bedtime. ) 90 tablet 11 03/25/2014 at Unknown time  . ibuprofen (ADVIL,MOTRIN) 600 MG tablet Take 1 tablet (600 mg total) by mouth every 6 (six) hours as needed for headache. 30 tablet 0 unknown  . metoprolol succinate (TOPROL XL) 25 MG 24 hr tablet Take 1 tablet (25 mg total) by mouth daily. 30 tablet 12 03/26/2014 at 0900  . nitroGLYCERIN (NITROSTAT) 0.4 MG SL tablet Place 1 tablet (0.4 mg total) under the tongue every 5 (five) minutes as needed for chest pain (up to 3 doses). 25 tablet 3 unknown  . warfarin (COUMADIN) 5 MG tablet Take 1.5 tablets (7.5 mg total) by mouth daily. 42 tablet 0 03/23/2014   Scheduled Meds: . atorvastatin  80 mg Oral q1800  . hydrocortisone  25 mg Rectal BID  . metoprolol succinate  25 mg Oral Daily  . pantoprazole  40 mg Oral Daily  . predniSONE  60 mg Oral Q breakfast  . sodium chloride  3 mL Intravenous Q12H  . warfarin  7.5 mg Oral ONCE-1800  . Warfarin - Pharmacist Dosing Inpatient   Does not apply q1800   Continuous Infusions:   PRN Meds:.acetaminophen, albuterol, HYDROmorphone (DILAUDID) injection, promethazine Assessment/Plan: Principal Problem:   Chest pain Active Problems:   Hyperlipemia   Essential hypertension   Coronary Artery Disease   Paresthesias   Antithrombin III deficiency   Apical mural thrombus   Long term current use of anticoagulant therapy   History of ischemic left PCA stroke   Bilateral lower extremity pain   Elevated AST (SGOT)   Radiculopathy of lumbosacral region   Lumbar herniated disc   Rectal bleeding   Pain in limb   Bleeding hemorrhoids  Lumbar Radiculopathy: Pain and strength improved with morphine and prednisone. Patient will need outpatient follow up to assess improvement in 2 weeks. Patient will be discharged on oral narcotics and prednisone.  -PT/OT -Dilaudid 1 mg IV Q4H prn transition to oral medication  today -Prednisone 60 mg daily for 5-7 days -No NSAIDs in setting of gastritis  Coagulopathy Disorder: Protein C activity normal at 76%. Will transition to lovenox and coumadin on discharge with follow up with Dr. Alexandria Lodge for INR check.   -Heparin per pharmacy -Coumadin per pharmacy -Transition to Lovenox and Coumadin on discharge until therapeutic -Protein C total in process  Normocytic Anemia: Hemoglobin stable 9.9>10.4>9.7>9.4. Surgery agrees with outpatient appointment for hemorrhoidectomy since not emergent.  -Coumadin per pharmacy, received 5 mg yesterday -Heparin gtt -Will transition to coumadin and lovenox on discharge -Protonix 40 mg daily -Resume heart healthy diet -Hydrocortisone (Anusol)  suppositories 25 mg BID -Follow up on 04/05/14 with General Surgery  HTN: BP controlled 110-130s/80-90s. On metoprolol 25 mg po daily at home.  -Continue metoprolol 25 mg po daily  DVT/PE ppxx: Heparin gtt and coumadin  Dispo: Anticipated discharge today.  The patient does have a current PCP (Ejiroghene Wendall Stade, MD) and does need an [facility] hospital follow-up appointment after discharge.  The patient does not have transportation limitations that hinder transportation to clinic appointments.  .Services Needed at time of discharge: Y = Yes, Blank = No PT:   OT:   RN:   Equipment:   Other:     LOS: 4 days   Jill Alexanders, DO PGY-1 Internal Medicine Resident Pager # [phone] 03/30/2014 2:36 PM

## 2014-03-30 NOTE — Discharge Instructions (Signed)
1. You have a follow up appointment scheduled as follows:  Karie Soda  Go on 04/05/2014 REGARDING YOUR HEMORRHOIDS.  APPOINTMENT ON 04/05/14 AT 10:15, BUT PLEASE ARRIVE NO LATER THAN 9:45AM TO CHECK IN AND FILL OUT PAPERWORK  [address] [place] [address] [phone]  Jeannine Boga III  On 04/04/2014 [address] [place] [address] [phone]  Luisa Dago, MD  On 04/04/2014 8:45 AM  [address] [place] [address] [phone]  2. Please take all medications as prescribed.   Take Coumadin 7.5 mg daily  Continue to take Lovenox 105 mg daily Lovenox injection or as instructed by pharmacist.   3. If you have worsening of your symptoms or new symptoms arise, please call the clinic ([phone]), or go to the ER immediately if symptoms are severe.

## 2014-03-30 NOTE — Progress Notes (Signed)
ANTICOAGULATION CONSULT NOTE Pharmacy Consult for Heparin/warfarin  Indication: coagulopathy  No Known Allergies  Patient Measurements: Height: 5\' 6"  (167.6 cm) Weight: 155 lb 9.6 oz (70.58 kg) IBW/kg (Calculated) : 59.3 Heparin Dosing Weight: 70.6 kg  Vital Signs: Temp: 98.1 F (36.7 C) (01/14 1326) Temp Source: Oral (01/14 1326) BP: 134/86 mmHg (01/14 1326) Pulse Rate: 65 (01/14 1326)  Labs:  Recent Labs  03/28/14 0459 03/28/14 0940 03/28/14 1957 03/29/14 0357 03/30/14 0012 03/30/14 0500  HGB 10.4*  --   --  9.7*  --  9.4*  HCT 32.1*  --   --  30.0*  --  28.9*  PLT 187  --   --  170  --  160  LABPROT 13.1  --   --  13.1  --  13.4  INR 0.99  --   --  0.98  --  1.01  HEPARINUNFRC 0.30 0.20* 0.63  --  0.23*  --   CREATININE 0.73  --   --   --   --   --     Estimated Creatinine Clearance: 83.1 mL/min (by C-G formula based on Cr of 0.73).   Assessment: 44 y/o Female with h/o CVA/LV thrombus, INR subtherapeutic, for heparin while procedures are planned.   Colonoscopy showed bleeding from hemorrhoids. Heparin restarted yesterday evening. Heparin currently off, plan appears to be to discharge home on lovenox>coumadin bridge.  INR 1.0   Goal of Therapy:  INR goal 2-3 Heparin level 0.3-0.7 units/ml Monitor platelets by anticoagulation protocol: Yes   Plan:  Warfarin 7.5mg  tonight Follow up transition to lovenox - no orders entered yet   Sheppard Coil PharmD., BCPS Clinical Pharmacist Pager [phone] 03/30/2014 1:44 PM

## 2014-03-30 NOTE — Progress Notes (Signed)
Physical Therapy Treatment Patient Details Name: Lindsey [name] MRN: [id number] DOB: 07/03/1970 Today's Date: 2014-04-18    History of Present Illness Pt adm with BLE pain and found to have HNP at L5-S1.    PT Comments    Pt twisting in bed on arrival and unable to recall all precautions. Reviewed precautions and restrictions with all mobility practiced and pt donning socks EOB correctly. Pt verbalized sequence for tooth brushing as well. Pt will continue to benefit from reinforcement and practice of mobility with back precautions. Will follow. Encouraged ambulation throughout the day.  Follow Up Recommendations  Outpatient PT     Equipment Recommendations       Recommendations for Other Services       Precautions / Restrictions Precautions Precautions: Back Precaution Comments: reviewed back precautions related to ADLs, transfers and gait    Mobility  Bed Mobility Overal bed mobility: Needs Assistance Bed Mobility: Rolling;Sidelying to Sit Rolling: Supervision Sidelying to sit: Supervision       General bed mobility comments: cues not to twist  Transfers Overall transfer level: Modified independent                  Ambulation/Gait Ambulation/Gait assistance: Modified independent (Device/Increase time) Ambulation Distance (Feet): 450 Feet Assistive device: None Gait Pattern/deviations: Step-through pattern;Decreased stride length   Gait velocity interpretation: Below normal speed for age/gender General Gait Details: Steady gait but slower than normal and guarded.   Stairs            Wheelchair Mobility    Modified Rankin (Stroke Patients Only)       Balance                                    Cognition Arousal/Alertness: Awake/alert Behavior During Therapy: WFL for tasks assessed/performed Overall Cognitive Status: Within Functional Limits for tasks assessed       Memory: Decreased recall of precautions               Exercises      General Comments        Pertinent Vitals/Pain Pain Score: 1  Pain Location: bil LE Pain Descriptors / Indicators: Aching Pain Intervention(s): Repositioned;Patient requesting pain meds-RN notified    Home Living                      Prior Function            PT Goals (current goals can now be found in the care plan section) Progress towards PT goals: Progressing toward goals    Frequency       PT Plan Current plan remains appropriate    Co-evaluation             End of Session   Activity Tolerance: Patient tolerated treatment well Patient left: in chair;with call bell/phone within reach     Time: 0177-9390 PT Time Calculation (min) (ACUTE ONLY): 15 min  Charges:  $Therapeutic Activity: 8-22 mins                    G Codes:      Delorse Lek 04/18/14, 8:05 AM Delaney Meigs, PT [phone]

## 2014-03-30 NOTE — Progress Notes (Signed)
03/30/2014 5:40 PM Discharge AVS meds taken today and those due this evening reviewed.  Follow-up appointments and when to call md reviewed.  D/C IV and TELE.  Questions and concerns addressed.   D/C home per orders. Kathryne Hitch

## 2014-03-30 NOTE — Progress Notes (Signed)
ANTICOAGULATION CONSULT NOTE Pharmacy Consult for Heparin/warfarin  Indication: coagulopathy  No Known Allergies  Patient Measurements: Height: 5\' 6"  (167.6 cm) Weight: 155 lb 9.6 oz (70.58 kg) IBW/kg (Calculated) : 59.3 Heparin Dosing Weight: 70.6 kg  Vital Signs: Temp: 98.4 F (36.9 C) (01/13 2021) Temp Source: Oral (01/13 2021) BP: 126/76 mmHg (01/13 2021) Pulse Rate: 63 (01/13 2021)  Labs:  Recent Labs  03/27/14 0155 03/27/14 0156  03/27/14 1002  03/28/14 0459 03/28/14 0940 03/28/14 1957 03/29/14 0357 03/30/14 0012  HGB  --   --   --   --   --  10.4*  --   --  9.7*  --   HCT  --   --   --   --   --  32.1*  --   --  30.0*  --   PLT  --   --   --   --   --  187  --   --  170  --   LABPROT  --  14.6  --   --   --  13.1  --   --  13.1  --   INR  --  1.12  --   --   --  0.99  --   --  0.98  --   HEPARINUNFRC  --   --   < >  --   < > 0.30 0.20* 0.63  --  0.23*  CREATININE  --  0.63  --   --   --  0.73  --   --   --   --   TROPONINI <0.03  --   --  <0.03  --   --   --   --   --   --   < > = values in this interval not displayed.  Estimated Creatinine Clearance: 83.1 mL/min (by C-G formula based on Cr of 0.73).   Assessment: 44 y/o Female with h/o CVA/LV thrombus, INR subtherapeutic, for heparin while procedures are planned.   Colonoscopy showed bleeding from hemorrhoids, orders to resume heparin/warfarin. Will wait 6 hours post procedure to restart. BRBPR noted last night and this morning.   INR 0.9 this am.  HL is 0.23 no bleeding noted.    Goal of Therapy:  INR goal 2-3 Heparin level 0.3-0.7 units/ml Monitor platelets by anticoagulation protocol: Yes   Plan:   Increase heparin to 1700 units/hr and recheck in 8h   Janice Coffin

## 2014-03-30 NOTE — Discharge Summary (Signed)
Name: Lindsey [name] MRN: [id number] DOB: 10/04/1970 44 y.o. PCP: Onnie Boer, MD  Date of Admission: 03/26/2014  4:40 PM Date of Discharge: 04/04/2014 Attending Physician: No att. providers found  Discharge Diagnosis:  Principal Problem:   Chest pain Active Problems:   Hyperlipemia   Essential hypertension   Coronary Artery Disease   Paresthesias   Antithrombin III deficiency   Apical mural thrombus   Long term current use of anticoagulant therapy   History of ischemic left PCA stroke   Bilateral lower extremity pain   Elevated AST (SGOT)   Radiculopathy of lumbosacral region   Lumbar herniated disc   Rectal bleeding   Pain in limb   Bleeding hemorrhoids  Discharge Medications:   Medication List    STOP taking these medications        ibuprofen 600 MG tablet  Commonly known as:  ADVIL,MOTRIN      TAKE these medications        acetaminophen 325 MG tablet  Commonly known as:  TYLENOL  Take 2 tablets (650 mg total) by mouth every 6 (six) hours as needed for moderate pain, fever or headache.     atorvastatin 40 MG tablet  Commonly known as:  LIPITOR  Take 2 tablets (80 mg total) by mouth daily.     enoxaparin 100 MG/ML injection  Commonly known as:  LOVENOX  Inject 1.05 mLs (105 mg total) into the skin daily.     hydrocortisone 25 MG suppository  Commonly known as:  ANUSOL-HC  Place 1 suppository (25 mg total) rectally 2 (two) times daily.     metoprolol succinate 25 MG 24 hr tablet  Commonly known as:  TOPROL XL  Take 1 tablet (25 mg total) by mouth daily.     nitroGLYCERIN 0.4 MG SL tablet  Commonly known as:  NITROSTAT  Place 1 tablet (0.4 mg total) under the tongue every 5 (five) minutes as needed for chest pain (up to 3 doses).     oxyCODONE-acetaminophen 5-325 MG per tablet  Commonly known as:  ROXICET  Take 1-2 tablets by mouth every 4 (four) hours as needed for severe pain.     pantoprazole 40 MG tablet  Commonly known as:  PROTONIX   Take 1 tablet (40 mg total) by mouth daily.     predniSONE 20 MG tablet  Commonly known as:  DELTASONE  Take 3 tablets (60 mg total) by mouth daily with breakfast.     warfarin 7.5 MG tablet  Commonly known as:  COUMADIN  Take 1 tablet (7.5 mg total) by mouth one time only at 6 PM.        Disposition and follow-up:   Ms.Lindsey [name] was discharged from [facility] in Good condition.  At the hospital follow up visit please address:  1.  Lumbar Radiculopathy: Please assess improvement of symptoms.  Anemia: Please recheck CBC and confirm follow up with surgery for hemorrhoidectomy.  Coagulopathy: Please recheck INR and compliance with coumadin.   2.  Labs / imaging needed at time of follow-up: INR, CBC  3.  Pending labs/ test needing follow-up: Total Protein C   Follow-up Appointments: Follow-up Information    Follow up with [name], MD On 04/13/2014.   Specialty:  Internal Medicine   Why:  3:15 pm   Contact information:   [address] [place] [address] [phone]       Follow up with Ardeth Sportsman., MD. Nyra Capes on 04/05/2014.  Specialty:  General Surgery   Why:  REGARDING YOUR HEMORRHOIDS.  APPOINTMENT ON 04/05/14 AT 10:15, BUT PLEASE ARRIVE NO LATER THAN 9:45AM TO CHECK IN AND FILL OUT PAPERWORK   Contact information:   [address] [place] [address] [phone]       Follow up with Gar Ponto, [facility] On 04/04/2014.   Specialty:  Pharmacist   Contact information:   [address] [place] [address] [phone]       Follow up with Luisa Dago, MD On 04/04/2014.   Specialty:  Internal Medicine   Why:  8:45 AM   Contact information:   [address] [place] [address] [phone]        Consultations:  Gastroenterology  Procedures Performed:  Dg Chest 2 View  03/26/2014   CLINICAL DATA:  Centralized chest pain.  EXAM: CHEST  2 VIEW  COMPARISON:  Radiographs and CT 03/15/2014  FINDINGS:  The cardiomediastinal contours are normal. Cardiac stent is seen. The lungs are clear. Pulmonary vasculature is normal. No consolidation, pleural effusion, or pneumothorax. No acute osseous abnormalities are seen.  IMPRESSION: No acute pulmonary process.   Electronically Signed   By: Rubye Oaks M.D.   On: 03/26/2014 19:05   Dg Chest 2 View  03/15/2014   CLINICAL DATA:  Chest pain, shortness of breath  EXAM: CHEST  2 VIEW  COMPARISON:  CT chest 10/30/2013  FINDINGS: The heart size and mediastinal contours are within normal limits. Both lungs are clear. The visualized skeletal structures are unremarkable.  IMPRESSION: No active cardiopulmonary disease.   Electronically Signed   By: Elige Ko   On: 03/15/2014 18:13   Ct Angio Chest Pe W/cm &/or Wo Cm  03/15/2014   CLINICAL DATA:  Pt reports having right leg pain and numbness sensation x 2 days. Went to eat after work today and had onset chest tightness and squeezing that she thought was indigestion. Reports having cardiac hx, took 1 nitro pta with no relief. Airway intact and ekg done.  EXAM: CT ANGIOGRAPHY CHEST WITH CONTRAST  TECHNIQUE: Multidetector CT imaging of the chest was performed using the standard protocol during bolus administration of intravenous contrast. Multiplanar CT image reconstructions and MIPs were obtained to evaluate the vascular anatomy.  CONTRAST:  100 cc Omnipaque 300  COMPARISON:  Chest radiograph of earlier today and CT of 10/30/2013  FINDINGS: Lungs/Pleura:  No airspace opacities.  No pleural fluid.  Heart/Mediastinum: The quality of this examination for evaluation of pulmonary embolism is good. No evidence of pulmonary embolism.  No evidence of aortic dissection or aneurysm. Probable pericardial recess adjacent the ascending aorta on image 39 of series 4.  Pulmonary artery enlargement, with the outflow tract measuring 3.2 cm. Normal heart size, without pericardial effusion. LAD coronary artery stent. No mediastinal or  hilar adenopathy.  Upper Abdomen: Normal imaged portions of the liver, spleen, stomach.  Bones/Musculoskeletal:  No acute osseous abnormality.  Review of the MIP images confirms the above findings.  IMPRESSION: 1.  No evidence of pulmonary embolism. 2. Pulmonary artery enlargement suggests pulmonary arterial hypertension. 3. LAD coronary artery stent.   Electronically Signed   By: Jeronimo Greaves M.D.   On: 03/15/2014 22:34   Mr Lumbar Spine Wo Contrast  03/27/2014   CLINICAL DATA:  Back pain for 2 weeks, increasing in severity, difficulty ambulating.  EXAM: MRI LUMBAR SPINE WITHOUT CONTRAST  TECHNIQUE: Multiplanar, multisequence MR imaging of the lumbar spine was performed. No  intravenous contrast was administered.  COMPARISON:  CT of the abdomen and pelvis October 30, 2013  FINDINGS: Lumbar vertebral bodies and posterior elements are intact and aligned with maintenance of lumbar lordosis. Using the reference level of the last well-formed intervertebral disc as L5-S1, mild L4-5 and L5-S1 disc height loss, is slight desiccation these 2 discs. Minimal acute discogenic endplate changes superior posterior S1.  Conus medullaris terminates at L1-2 and appears normal morphology and signal characteristics. Cauda equina is unremarkable. The RIGHT psoas muscle is slightly smaller than the LEFT, recommend correlation with ambulation.  Level by level evaluation:  L1-2, L2-3: No disc bulge, canal stenosis nor neural foraminal narrowing.  L3-4: 2 mm broad-based disc bulge asymmetric to the RIGHT. Mild facet arthropathy and ligamentum flavum redundancy. No canal stenosis. Mild to moderate RIGHT, mild LEFT neural foraminal narrowing.  L4-5: 2-3 mm broad-based disc bulge, mild facet arthropathy and ligamentum flavum redundancy without canal stenosis. Moderate LEFT greater than RIGHT neural foraminal narrowing.  L5-S1: LEFT central approximate 7 x 4 mm (transverse by AP) disc extrusion with 7 mm of caudal subligamentous contiguous  extent, contacting the traversing LEFT S1 nerve within the lateral recess. Intermediate to bright STIR signal within the extruded material suggest acuity. Mild canal stenosis. Mild bilateral neural foraminal narrowing.  IMPRESSION: Approximate 7 x 4 x 7 mm LEFT central L5-S1 acute appearing contiguous disc extrusion contacting the traversing LEFT S1 nerve, resulting mild canal stenosis.  No acute fracture nor malalignment.  L4-5 and L5-S1 neural foraminal narrowing: Moderate at L4-5.   Electronically Signed   By: Awilda Metro   On: 03/27/2014 03:52   Admission HPI: Ms Kidney is a 44 year old woman with CAD s/p MI x 2 (2000, 2009 s/p BMS), mural thrombus with alleged diagnosis of antithrombin III deficiency on warfarin, CVA 2010 presents with chest pain. She was in her usual state of health until about a month ago. She describes it as chest squeezing on the midsternum that was non-radiating. It lasts a couple of minutes and then eases up. Nitroglycerin in ED made it worse. Nothing else, including rest or deep breathes, makes it better or worse. She had some associated SOB, diaphoresis and clear emesis. She thinks this is different than when she has had MIs in the past as it is less sharp. Denies cough, trauma, fevers, chills, night sweats, abdominal pain, diarrhea, constipation.   She also notes R>L bilateral leg pain for one month. Se describes it as dull aching pain. The R leg is from the heal all the way to buttock but the L is only from the hamstring to the buttock. It is worse on the backside of the legs. She said since coming to the hospital it has become numb. It is impairing her ambulation. She denies paresthesia. She has not been taking her warfarin for 3 days as the leg pain was too bad to go to pharmacy to pick it up. She was recently started on lipitor but has only taken it twice and she had the pain before this.  She recently was admitted from 12/6-9 for TIA work-up which revealed CTA head/neck  negative, TTE that did not note apical thrombus.  She was in the ED 03/15/14 for similar leg and chest pain. Work-up at that time had chest CTA negative for DVT. She was set up for outpatient LE Doppler which has not been completed.  In the ED, she received morphine 4 mg iv once.   Hospital Course by problem  list: Principal Problem:   Chest pain Active Problems:   Hyperlipemia   Essential hypertension   Coronary Artery Disease   Paresthesias   Antithrombin III deficiency   Apical mural thrombus   Long term current use of anticoagulant therapy   History of ischemic left PCA stroke   Bilateral lower extremity pain   Elevated AST (SGOT)   Radiculopathy of lumbosacral region   Lumbar herniated disc   Rectal bleeding   Pain in limb   Bleeding hemorrhoids   Lumbar Radiculopathy: Patient had bilateral positive leg raises, lower extremity weakness and pain which was concerning for radiculopathy. MRI Lumbar Spine showed an approximate 7 x 4 x 7 mm LEFT central L5-S1 acute appearing contiguous disc extrusion contacting the traversing LEFT S1 nerve, resulting mild canal stenosis. No acute fracture nor malalignment. L4-5 and L5-S1 neural foraminal narrowing: Moderate at L4-5. These results were discussed with radiology who stated inflammation is the cause for symptoms greater on right than left. She had a neurology outpatient appointment scheduled for 03/29/14 w Dr Marvel Plan. She had no symptoms of cauda equina. Conservative management with NSAIDs or acetaminophen is recommended in disc herniations with no or mild motor deficit. Symptoms will be re-evaluated at 2 week interval to assess response. If disc herniation or foraminal stenosis causes progressive or severe motor deficit surgery with discectomy or formaminal decompression is indicated. Patient was managed with PT/OT, Morphine 1 mg IV Q4H prn, and Prednisone 60 mg daily. We did not prescribe NSAIDs in setting of gastritis. Patient was  discharged on oral narcotics and prednisone with follow up with [facility] clinic. Please assess symptoms and pain control on discharge.   Chest Pain in Setting of CAD and H/o MI and NSTEMI: Patient has a high risk cardiac history as she has CAD s/p apical LAD MI 2000, NSTEMI s/p BMS prox LAD 2009. Patient had atypical chest pain, troponins negative, and non-specific biphasic TWI in leads V2, V3 on EKG. CXR was negative. At home she is on lipitor 80 mg po daily, metoprolol 25 mg daily, and warfarin 7.5 mg daily. Chest pain resolved.   Coagulopathy Disorder: Patient has a history of CVA, MI x 2 and apical thrombus. Patient has a family history of clots, CVA and MI at young ages. Patient has extensive workup in the past and has been on coumadin for anticoagulation. However, previous coagulation work-up has shown normal antithrombin III x 4, normal homocysteine, negative lupus anticoagulant, negative prothrombin gene mutation, negative factor V leiden gene mutation, negative cardiolipin antibodies. Patient has a history convincing of coagulopathy, but extensive work up during hospital admission was negative. Patient was discharge on lovenox and coumadin until therapeutic. Please recheck INR on follow up.   Normocytic Anemia: Presenting hemoglobin was 9.9 and was 11 twelve days prior to admission. Her baseline appears to be ~ 11 for the past year although three years ago she had hemoglobin ~ 13. Last anemia panel 06/2011 with iron 80, TIBC 290, ferritin 56. FOBT positive. LDH normal at 144, Retic 1.6, ferritin normal at 54, elevated iron at 251, saturation ratio high at 74, folate intermediate at 3.6, vitamin b12 low at 186. Patient admitted to darker stools and clots in stool. Patient has a history of external hemorrhoids, erosive gastritis, esophagitis, duodenitis seen May 2015. At that time, GI recommended PPI, avoiding NSAIDs and close follow since patient on anticoagulation. Given anemia, +FOBT, and chronic  anticoagulation, we consulted GI who performed colonoscopy which was significant for internal hemorrhoids. GI  recommended consult to surgery and surgery recommended hemorrhoidectomy as outpatient since not emergent. Patient will follow up on 04/05/14 with surgery. She will continue protonix 40 daily and anusol suppositories BID.   HTN: BP controlled 130-150s/80-90s. On metoprolol 25 mg po daily at home. We continued metoprolol 25 mg po daily.  HLD: Last lipid profile 02/20/14 with cholesterol 200, triglycerides 189, HDL 82, LDL 80. Patient is on atorvastatin 80 mg po qhs at home. This was continued on discharge.   Discharge Vitals:   BP 134/86 mmHg  Pulse 65  Temp(Src) 98.1 F (36.7 C) (Oral)  Resp 19  Ht 5\' 6"  (1.676 m)  Wt 70.58 kg (155 lb 9.6 oz)  BMI 25.13 kg/m2  SpO2 100%  LMP 03/15/2014  Discharge Labs:  No results found for this or any previous visit (from the past 24 hour(s)).  Signed: Jill Alexanders, DO PGY-1 Internal Medicine Resident Pager # [phone] 04/04/2014 7:13 PM

## 2014-04-04 ENCOUNTER — Ambulatory Visit: Payer: Medicaid Other | Admitting: Internal Medicine

## 2014-04-04 ENCOUNTER — Ambulatory Visit: Payer: Medicaid Other

## 2014-04-04 ENCOUNTER — Encounter: Payer: Self-pay | Admitting: Internal Medicine

## 2014-04-13 ENCOUNTER — Encounter: Payer: Medicaid Other | Admitting: Internal Medicine

## 2014-05-11 ENCOUNTER — Emergency Department (HOSPITAL_COMMUNITY): Payer: Medicaid Other

## 2014-05-11 ENCOUNTER — Encounter (HOSPITAL_COMMUNITY): Payer: Self-pay

## 2014-05-11 ENCOUNTER — Emergency Department (HOSPITAL_COMMUNITY)
Admission: EM | Admit: 2014-05-11 | Discharge: 2014-05-11 | Disposition: A | Discharge status: Home / Self Care | Payer: Medicaid Other | Attending: Emergency Medicine | Admitting: Emergency Medicine

## 2014-05-11 DIAGNOSIS — Z7952 Long term (current) use of systemic steroids: Secondary | ICD-10-CM | POA: Insufficient documentation

## 2014-05-11 DIAGNOSIS — I252 Old myocardial infarction: Secondary | ICD-10-CM | POA: Insufficient documentation

## 2014-05-11 DIAGNOSIS — Z9889 Other specified postprocedural states: Secondary | ICD-10-CM | POA: Insufficient documentation

## 2014-05-11 DIAGNOSIS — R197 Diarrhea, unspecified: Secondary | ICD-10-CM | POA: Diagnosis not present

## 2014-05-11 DIAGNOSIS — R112 Nausea with vomiting, unspecified: Secondary | ICD-10-CM | POA: Insufficient documentation

## 2014-05-11 DIAGNOSIS — Z8673 Personal history of transient ischemic attack (TIA), and cerebral infarction without residual deficits: Secondary | ICD-10-CM | POA: Diagnosis not present

## 2014-05-11 DIAGNOSIS — R079 Chest pain, unspecified: Secondary | ICD-10-CM | POA: Diagnosis not present

## 2014-05-11 DIAGNOSIS — D6859 Other primary thrombophilia: Secondary | ICD-10-CM | POA: Diagnosis not present

## 2014-05-11 DIAGNOSIS — Z79899 Other long term (current) drug therapy: Secondary | ICD-10-CM | POA: Insufficient documentation

## 2014-05-11 DIAGNOSIS — I2510 Atherosclerotic heart disease of native coronary artery without angina pectoris: Secondary | ICD-10-CM | POA: Insufficient documentation

## 2014-05-11 DIAGNOSIS — Z720 Tobacco use: Secondary | ICD-10-CM | POA: Insufficient documentation

## 2014-05-11 DIAGNOSIS — Z7901 Long term (current) use of anticoagulants: Secondary | ICD-10-CM | POA: Diagnosis not present

## 2014-05-11 DIAGNOSIS — R200 Anesthesia of skin: Secondary | ICD-10-CM | POA: Insufficient documentation

## 2014-05-11 DIAGNOSIS — Z8659 Personal history of other mental and behavioral disorders: Secondary | ICD-10-CM | POA: Diagnosis not present

## 2014-05-11 DIAGNOSIS — Z9861 Coronary angioplasty status: Secondary | ICD-10-CM | POA: Diagnosis not present

## 2014-05-11 DIAGNOSIS — L03113 Cellulitis of right upper limb: Secondary | ICD-10-CM | POA: Diagnosis not present

## 2014-05-11 DIAGNOSIS — M25441 Effusion, right hand: Secondary | ICD-10-CM | POA: Diagnosis present

## 2014-05-11 LAB — BASIC METABOLIC PANEL
ANION GAP: 12 (ref 5–15)
BUN: <5 mg/dL — ABNORMAL LOW (ref 6–23)
CALCIUM: 8.4 mg/dL (ref 8.4–10.5)
CO2: 20 mmol/L (ref 19–32)
CREATININE: 0.6 mg/dL (ref 0.50–1.10)
Chloride: 106 mmol/L (ref 96–112)
GFR calc Af Amer: >90 mL/min (ref >90)
Glucose, Bld: 109 mg/dL — ABNORMAL HIGH (ref 70–99)
Potassium: 3.4 mmol/L — ABNORMAL LOW (ref 3.5–5.1)
SODIUM: 138 mmol/L (ref 135–145)

## 2014-05-11 LAB — CBC
HCT: 33.8 % — ABNORMAL LOW (ref 36.0–46.0)
HEMOGLOBIN: 11 g/dL — AB (ref 12.0–15.0)
MCH: 29.5 pg (ref 26.0–34.0)
MCHC: 32.5 g/dL (ref 30.0–36.0)
MCV: 90.6 fL (ref 78.0–100.0)
PLATELETS: 240 10*3/uL (ref 150–400)
RBC: 3.73 MIL/uL — ABNORMAL LOW (ref 3.87–5.11)
RDW: 16.5 % — ABNORMAL HIGH (ref 11.5–15.5)
WBC: 8.2 10*3/uL (ref 4.0–10.5)

## 2014-05-11 LAB — I-STAT TROPONIN, ED
Troponin i, poc: 0 ng/mL (ref 0.00–0.08)
Troponin i, poc: 0 ng/mL (ref 0.00–0.08)

## 2014-05-11 LAB — PROTIME-INR
INR: 1.09 (ref 0.00–1.49)
Prothrombin Time: 14.2 seconds (ref 11.6–15.2)

## 2014-05-11 MED ORDER — GI COCKTAIL ~~LOC~~
30.0000 mL | Freq: Once | ORAL | Status: AC
  Administered 2014-05-11: 30 mL via ORAL
  Filled 2014-05-11: qty 30

## 2014-05-11 MED ORDER — WARFARIN SODIUM 5 MG PO TABS: 5.0000 mg | ORAL_TABLET | Freq: Once | ORAL | Status: DC

## 2014-05-11 MED ORDER — ENOXAPARIN SODIUM 100 MG/ML ~~LOC~~ SOLN
1.5000 mg/kg | Freq: Once | SUBCUTANEOUS | Status: DC
  Filled 2014-05-11: qty 2

## 2014-05-11 MED ORDER — WARFARIN SODIUM 7.5 MG PO TABS
7.5000 mg | ORAL_TABLET | Freq: Once | ORAL | Status: AC
  Administered 2014-05-11: 7.5 mg via ORAL
  Filled 2014-05-11: qty 1

## 2014-05-11 MED ORDER — CEPHALEXIN 250 MG PO CAPS
500.0000 mg | ORAL_CAPSULE | Freq: Once | ORAL | Status: AC
  Administered 2014-05-11: 500 mg via ORAL
  Filled 2014-05-11: qty 2

## 2014-05-11 MED ORDER — ENOXAPARIN SODIUM 120 MG/0.8ML ~~LOC~~ SOLN
1.5000 mg/kg | SUBCUTANEOUS | Status: DC
  Administered 2014-05-11: 115 mg via SUBCUTANEOUS
  Filled 2014-05-11: qty 0.8

## 2014-05-11 MED ORDER — CEPHALEXIN 500 MG PO CAPS: 500.0000 mg | ORAL_CAPSULE | Freq: Four times a day (QID) | ORAL | Status: DC

## 2014-05-11 MED ORDER — ENOXAPARIN SODIUM 100 MG/ML ~~LOC~~ SOLN: 1.0000 mg/kg | Freq: Once | SUBCUTANEOUS | Status: DC

## 2014-05-11 MED ORDER — ONDANSETRON HCL 4 MG/2ML IJ SOLN
4.0000 mg | Freq: Once | INTRAMUSCULAR | Status: AC
  Administered 2014-05-11: 4 mg via INTRAVENOUS
  Filled 2014-05-11: qty 2

## 2014-05-11 MED ORDER — MORPHINE SULFATE 4 MG/ML IJ SOLN
4.0000 mg | Freq: Once | INTRAMUSCULAR | Status: AC
  Administered 2014-05-11: 4 mg via INTRAVENOUS
  Filled 2014-05-11: qty 1

## 2014-05-11 MED ORDER — ONDANSETRON 4 MG PO TBDP: 4.0000 mg | ORAL_TABLET | Freq: Three times a day (TID) | ORAL | Status: DC | PRN

## 2014-05-11 MED ORDER — ENOXAPARIN SODIUM 120 MG/0.8ML ~~LOC~~ SOLN: 1.5000 mg/kg | SUBCUTANEOUS | Status: DC

## 2014-05-11 NOTE — Discharge Instructions (Signed)
Please read and follow all provided instructions.  Your diagnoses today include:  1. Chest pain, unspecified chest pain type   2. Cellulitis of right hand    Tests performed today include:  An EKG of your heart  A chest x-ray  Cardiac enzymes - a blood test for heart muscle damage, no sign of heart attack  Blood counts and electrolytes  Coumadin level - low  Vital signs. See below for your results today.   Medications prescribed:   Lovenox - medication to thin blood   Keflex (cephalexin) - antibiotic  You have been prescribed an antibiotic medicine: take the entire course of medicine even if you are feeling better. Stopping early can cause the antibiotic not to work.  Take any prescribed medications only as directed.  Follow-up instructions: Please follow-up with your primary care provider as soon as you can for further evaluation of your symptoms.   Return instructions:  SEEK IMMEDIATE MEDICAL ATTENTION IF:  You have severe chest pain, especially if the pain is crushing or pressure-like and spreads to the arms, back, neck, or jaw, or if you have sweating, nausea (feeling sick to your stomach), or shortness of breath. THIS IS AN EMERGENCY. Don't wait to see if the pain will go away. Get medical help at once. Call 911 or [phone] (operator). DO NOT drive yourself to the hospital.   Your chest pain gets worse and does not go away with rest.   You have an attack of chest pain lasting longer than usual, despite rest and treatment with the medications your caregiver has prescribed.   You wake from sleep with chest pain or shortness of breath.  You feel dizzy or faint.  You have chest pain not typical of your usual pain for which you originally saw your caregiver.   You have any other emergent concerns regarding your health.  Additional Information: Chest pain comes from many different causes. Your caregiver has diagnosed you as having chest pain that is not specific for one  problem, but does not require admission.  You are at low risk for an acute heart condition or other serious illness.   Your vital signs today were: BP 143/86 mmHg   Pulse 81   Temp(Src) 98.6 F (37 C) (Oral)   Resp 14   Ht 5\' 6"  (1.676 m)   Wt 168 lb (76.204 kg)   BMI 27.13 kg/m2   SpO2 99% If your blood pressure (BP) was elevated above 135/85 this visit, please have this repeated by your doctor within one month. --------------

## 2014-05-11 NOTE — ED Notes (Signed)
Pt reports no relief from GI cocktail.

## 2014-05-11 NOTE — ED Notes (Signed)
Lab called and made aware to add on additional labs.

## 2014-05-11 NOTE — ED Provider Notes (Signed)
CSN: [id number]     Arrival date & time 05/11/14  1556 History  This chart was scribed for non-physician practitioner, Renne Crigler, working with Vida Roller, MD by Richarda Overlie, ED Scribe. This patient was seen in room C25C/C25C and the patient's care was started at 7:38 PM.      Chief Complaint  Patient presents with  . Chest Pain  . Joint Swelling   HPI Comments: Most recent cath 12/2011:   Impression: 1. Single vessel CAD with patent stent in the proximal LAD with stable mild in-stent restenosis.  2. Segmental wall motion abnormality with preserved LV systolic function that is stable over the last 3 years.  3. Non-cardiac chest pain  The history is provided by the patient and medical records. No language interpreter was used.   HPI Comments: Lindsey [name] is a 44 y.o. female with a history of stroke, CAD and MI who presents to the Emergency Department complaining of constant, central chest pain that started this morning at 7:30AM. Pt describes the pain as a squeezing and rates it as a 6/10 at this time. She says that she felt sick yesterday and reports she has been vomiting and has had diarrhea. Pt says she took 2 nitros with some pain relief but her pain came back within minutes. She says that she sometimes feels like she has to catch her breath. Pt reports that she had similar symptoms during her MI but says she had other symptoms (diaphoresis, arm pain) during the time of her MI that she has not experienced today. Pt states her coumadin medication dosage was increased last month.   She also complains that her right hand was bitten a few days ago and that she has experienced some swelling and pain in the area. She denies fever.   Past Medical History  Diagnosis Date  . Stroke     assoc with short term memory loss and right peripheral vision loss; age 30   . Blood transfusion   . Anxiety   . Coronary artery disease     Apical LAD infarction '00; NSTEMI s/p BMS to prox LAD  '09; Cath 12/2011 single vessel CAD w/ patent LAD stent w/ stable mild ISR, nl LV systolic fxn  . Anemia   . Antithrombin III deficiency     ?pt not sure if true diagnosis  . Tobacco abuse   . High blood pressure   . Myocardial infarction    Past Surgical History  Procedure Laterality Date  . Tubal ligation    . Cardiac catheterization    . Esophagogastroduodenoscopy N/A 06/09/2012    Procedure: ESOPHAGOGASTRODUODENOSCOPY (EGD);  Surgeon: Theda Belfast, MD;  Location: [facility] ENDOSCOPY;  Service: Endoscopy;  Laterality: N/A;  . Coronary angioplasty    . Esophagogastroduodenoscopy N/A 08/02/2013    Procedure: ESOPHAGOGASTRODUODENOSCOPY (EGD);  Surgeon: Meryl Dare, MD;  Location: [facility] ENDOSCOPY;  Service: Endoscopy;  Laterality: N/A;  . Left heart catheterization with coronary angiogram N/A 12/24/2011    Procedure: LEFT HEART CATHETERIZATION WITH CORONARY ANGIOGRAM;  Surgeon: Kathleene Hazel, MD;  Location: [facility] CATH LAB;  Service: Cardiovascular;  Laterality: N/A;  . Colonoscopy N/A 03/29/2014    Procedure: COLONOSCOPY;  Surgeon: Louis Meckel, MD;  Location: [facility] ENDOSCOPY;  Service: Endoscopy;  Laterality: N/A;   Family History  Problem Relation Age of Onset  . Heart disease Brother     arrhythmia; died  . Breast cancer Maternal Aunt    History  Substance Use Topics  .  Smoking status: Current Every Day Smoker -- 0.20 packs/day for 1 years    Types: Cigarettes  . Smokeless tobacco: Never Used  . Alcohol Use: 0.0 oz/week    0 Cans of beer per week     Comment: occasion   OB History    No data available     Review of Systems  Constitutional: Negative for fever and diaphoresis.  Eyes: Negative for redness.  Respiratory: Negative for cough and shortness of breath.   Cardiovascular: Positive for chest pain. Negative for palpitations and leg swelling.  Gastrointestinal: Positive for nausea, vomiting and diarrhea. Negative for abdominal pain.  Genitourinary: Negative for  dysuria.  Musculoskeletal: Positive for myalgias. Negative for back pain and neck pain.  Skin: Positive for color change and rash.  Neurological: Positive for numbness. Negative for syncope and light-headedness.  Psychiatric/Behavioral: The patient is not nervous/anxious.     Allergies  Review of patient's allergies indicates no known allergies.  Home Medications   Prior to Admission medications   Medication Sig Start Date End Date Taking? Authorizing Provider  acetaminophen (TYLENOL) 325 MG tablet Take 2 tablets (650 mg total) by mouth every 6 (six) hours as needed for moderate pain, fever or headache. 02/21/14   Tasrif Ahmed, MD  atorvastatin (LIPITOR) 40 MG tablet Take 2 tablets (80 mg total) by mouth daily. Patient taking differently: Take 80 mg by mouth at bedtime.  02/21/14 02/21/15  Tasrif Ahmed, MD  enoxaparin (LOVENOX) 100 MG/ML injection Inject 1.05 mLs (105 mg total) into the skin daily. 03/30/14   Courtney Paris, MD  hydrocortisone (ANUSOL-HC) 25 MG suppository Place 1 suppository (25 mg total) rectally 2 (two) times daily. 03/30/14   Jill Alexanders, MD  metoprolol succinate (TOPROL XL) 25 MG 24 hr tablet Take 1 tablet (25 mg total) by mouth daily. 03/03/14   Lewayne Bunting, MD  nitroGLYCERIN (NITROSTAT) 0.4 MG SL tablet Place 1 tablet (0.4 mg total) under the tongue every 5 (five) minutes as needed for chest pain (up to 3 doses). 10/14/13   Beatrice Lecher, PA-C  oxyCODONE-acetaminophen (ROXICET) 5-325 MG per tablet Take 1-2 tablets by mouth every 4 (four) hours as needed for severe pain. 03/30/14   Courtney Paris, MD  pantoprazole (PROTONIX) 40 MG tablet Take 1 tablet (40 mg total) by mouth daily. 03/30/14   Courtney Paris, MD  predniSONE (DELTASONE) 20 MG tablet Take 3 tablets (60 mg total) by mouth daily with breakfast. 03/30/14   Courtney Paris, MD  warfarin (COUMADIN) 7.5 MG tablet Take 1 tablet (7.5 mg total) by mouth one time only at 6 PM. 03/30/14   Courtney Paris, MD   BP 161/103 mmHg   Pulse 97  Temp(Src) 98.6 F (37 C) (Oral)  Resp 20  SpO2 98%   Physical Exam  Constitutional: She is oriented to person, place, and time. She appears well-developed and well-nourished.  HENT:  Head: Normocephalic and atraumatic.  Mouth/Throat: Oropharynx is clear and moist and mucous membranes are normal. Mucous membranes are not dry.  Eyes: Conjunctivae are normal. Right eye exhibits no discharge. Left eye exhibits no discharge.  Neck: Trachea normal and normal range of motion. Neck supple. Normal carotid pulses and no JVD present. No muscular tenderness present. Carotid bruit is not present. No tracheal deviation present.  Cardiovascular: Normal rate, regular rhythm, S1 normal, S2 normal, normal heart sounds and intact distal pulses.  Exam reveals no decreased pulses.   No murmur heard. Pulmonary/Chest: Effort normal. No  respiratory distress. She has no wheezes. She exhibits no tenderness.  Abdominal: Soft. Normal aorta and bowel sounds are normal. She exhibits no distension. There is no tenderness. There is no rebound and no guarding.  Musculoskeletal: She exhibits edema and tenderness.  Patient with small blister between first and second digit of right hand. There is surrounding cellulitis extending proximally to the wrist. Full ROM R wrist, hand, fingers.   Neurological: She is alert and oriented to person, place, and time.  Skin: Skin is warm and dry. She is not diaphoretic. No cyanosis. No pallor.  Psychiatric: She has a normal mood and affect. Her behavior is normal.  Nursing note and vitals reviewed.   ED Course  Procedures   DIAGNOSTIC STUDIES: Oxygen Saturation is 98% on RA, normal by my interpretation.    COORDINATION OF CARE: 7:47 PM Discussed treatment plan with pt at bedside and pt agreed to plan.   Labs Review Labs Reviewed  CBC - Abnormal; Notable for the following:    RBC 3.73 (*)    Hemoglobin 11.0 (*)    HCT 33.8 (*)    RDW 16.5 (*)    All other  components within normal limits  BASIC METABOLIC PANEL - Abnormal; Notable for the following:    Potassium 3.4 (*)    Glucose, Bld 109 (*)    BUN <5 (*)    All other components within normal limits  PROTIME-INR  I-STAT TROPOININ, ED  Rosezena Sensor, ED    Imaging Review Dg Chest 2 View  05/11/2014   CLINICAL DATA:  Chest pain.  Smoker  EXAM: CHEST  2 VIEW  COMPARISON:  03/26/2014  FINDINGS: The heart size and mediastinal contours are within normal limits. Both lungs are clear. The visualized skeletal structures are unremarkable.  IMPRESSION: No active cardiopulmonary disease.   Electronically Signed   By: Signa Kell M.D.   On: 05/11/2014 17:15     Date: 05/11/2014  Rate: 90  Rhythm: normal sinus rhythm  QRS Axis: right  Intervals: normal  ST/T Wave abnormalities: normal  Conduction Disutrbances:none  Narrative Interpretation:   Old EKG Reviewed: unchanged    8:24 PM Patient seen and examined. Work-up initiated. Medications ordered.   Vital signs reviewed and are as follows: BP 161/103 mmHg  Pulse 97  Temp(Src) 98.6 F (37 C) (Oral)  Resp 20  SpO2 98%  10:58 PM Patient feeling better. Patient discussed with Dr. Hyacinth Meeker. Delta trop neg. Patient is not therapeutic on Coumadin. She was restarted on Lovenox bridge and Coumadin in emergency department.  I spoke with patient's PCP, [facility], and requested close follow-up. They will call patient in morning for follow-up likely tomorrow morning.  Discharge to home with Zofran, Keflex for cellulitis, Lovenox.   Patient was counseled to return with severe chest pain, especially if the pain is crushing or pressure-like and spreads to the arms, back, neck, or jaw, or if they have sweating, nausea, or shortness of breath with the pain. They were encouraged to call 911 with these symptoms.   They were also told to return if their chest pain gets worse and does not go away with rest, they have an attack of chest pain lasting longer  than usual despite rest and treatment with the medications their caregiver has prescribed, if they wake from sleep with chest pain or shortness of breath, if they feel dizzy or faint, if they have chest pain not typical of their usual pain, or if they have any other emergent concerns  regarding their health.  The patient verbalized understanding and agreed.        MDM   Final diagnoses:  Chest pain, unspecified chest pain type  Cellulitis of right hand   Chest pain: Patient with history of known coronary artery disease, stenting in the past, recent admission for chest pain presents with atypical chest pain. Troponin negative 2. EKG is nonischemic and unchanged. Patient has a poor story for ACS. Symptoms ongoing for greater than 12 hours. Do not suspect acute coronary syndrome. Patient does not have a history of blood clots and her vital signs are normal tonight. Do not feel that additional imaging is indicated to rule out PE. Patient has had frequent CT scans over the past several years. Feel patient is suitable for discharge to home at this time with close PCP follow-up. PCP follow-up arranged.  Cellulitis: Mild. Patient started on Keflex in the emergency department. She appears well, nontoxic. No systemic symptoms of illness. This can be followed up as outpatient. She can have this rechecked tomorrow during her PCP appointment.  I personally performed the services described in this documentation, which was scribed in my presence. The recorded information has been reviewed and is accurate.      Renne Crigler, PA-C 05/11/14 2302  Vida Roller, MD 05/12/14 [phone]

## 2014-05-11 NOTE — ED Notes (Signed)
Pt reporting chest pain that started this am.  Pain substernal no radation. Squeezing in nature.  Pt took 2 nitros with some pain relief but pain came back within minutes. Pt also reporting right arm swelling.  Unaware if anything bit her.  Hand swollen in triage.

## 2014-05-11 NOTE — ED Notes (Signed)
Pt reports midsternal chest pain that is non-radiating in nature that began this morning.  Pt took 2 nitro with relief.  Pt is also c/o n/v and headache

## 2014-05-12 ENCOUNTER — Encounter: Payer: Self-pay | Admitting: *Deleted

## 2014-05-12 ENCOUNTER — Encounter (HOSPITAL_COMMUNITY): Payer: Self-pay | Admitting: Emergency Medicine

## 2014-05-12 ENCOUNTER — Emergency Department (HOSPITAL_COMMUNITY)
Admission: EM | Admit: 2014-05-12 | Discharge: 2014-05-12 | Disposition: A | Discharge status: Home / Self Care | Payer: Medicaid Other | Attending: Emergency Medicine | Admitting: Emergency Medicine

## 2014-05-12 DIAGNOSIS — R0789 Other chest pain: Secondary | ICD-10-CM | POA: Diagnosis not present

## 2014-05-12 DIAGNOSIS — Z9861 Coronary angioplasty status: Secondary | ICD-10-CM | POA: Insufficient documentation

## 2014-05-12 DIAGNOSIS — Z8659 Personal history of other mental and behavioral disorders: Secondary | ICD-10-CM | POA: Insufficient documentation

## 2014-05-12 DIAGNOSIS — Z7952 Long term (current) use of systemic steroids: Secondary | ICD-10-CM | POA: Insufficient documentation

## 2014-05-12 DIAGNOSIS — I252 Old myocardial infarction: Secondary | ICD-10-CM | POA: Insufficient documentation

## 2014-05-12 DIAGNOSIS — Z862 Personal history of diseases of the blood and blood-forming organs and certain disorders involving the immune mechanism: Secondary | ICD-10-CM | POA: Diagnosis not present

## 2014-05-12 DIAGNOSIS — I10 Essential (primary) hypertension: Secondary | ICD-10-CM | POA: Insufficient documentation

## 2014-05-12 DIAGNOSIS — R112 Nausea with vomiting, unspecified: Secondary | ICD-10-CM | POA: Diagnosis not present

## 2014-05-12 DIAGNOSIS — Z792 Long term (current) use of antibiotics: Secondary | ICD-10-CM | POA: Insufficient documentation

## 2014-05-12 DIAGNOSIS — Z79899 Other long term (current) drug therapy: Secondary | ICD-10-CM | POA: Diagnosis not present

## 2014-05-12 DIAGNOSIS — Z7901 Long term (current) use of anticoagulants: Secondary | ICD-10-CM | POA: Diagnosis not present

## 2014-05-12 DIAGNOSIS — I2510 Atherosclerotic heart disease of native coronary artery without angina pectoris: Secondary | ICD-10-CM | POA: Insufficient documentation

## 2014-05-12 DIAGNOSIS — Z8673 Personal history of transient ischemic attack (TIA), and cerebral infarction without residual deficits: Secondary | ICD-10-CM | POA: Diagnosis not present

## 2014-05-12 DIAGNOSIS — Z720 Tobacco use: Secondary | ICD-10-CM | POA: Insufficient documentation

## 2014-05-12 DIAGNOSIS — R079 Chest pain, unspecified: Secondary | ICD-10-CM | POA: Diagnosis present

## 2014-05-12 DIAGNOSIS — Z9889 Other specified postprocedural states: Secondary | ICD-10-CM | POA: Diagnosis not present

## 2014-05-12 LAB — CBC
HEMATOCRIT: 33.9 % — AB (ref 36.0–46.0)
Hemoglobin: 10.8 g/dL — ABNORMAL LOW (ref 12.0–15.0)
MCH: 29.8 pg (ref 26.0–34.0)
MCHC: 31.9 g/dL (ref 30.0–36.0)
MCV: 93.6 fL (ref 78.0–100.0)
PLATELETS: 270 10*3/uL (ref 150–400)
RBC: 3.62 MIL/uL — ABNORMAL LOW (ref 3.87–5.11)
RDW: 16.6 % — ABNORMAL HIGH (ref 11.5–15.5)
WBC: 6.1 10*3/uL (ref 4.0–10.5)

## 2014-05-12 LAB — BASIC METABOLIC PANEL
ANION GAP: 9 (ref 5–15)
BUN: 6 mg/dL (ref 6–23)
CALCIUM: 8.6 mg/dL (ref 8.4–10.5)
CO2: 23 mmol/L (ref 19–32)
Chloride: 102 mmol/L (ref 96–112)
Creatinine, Ser: 0.63 mg/dL (ref 0.50–1.10)
GFR calc non Af Amer: >90 mL/min (ref >90)
Glucose, Bld: 116 mg/dL — ABNORMAL HIGH (ref 70–99)
POTASSIUM: 3.3 mmol/L — AB (ref 3.5–5.1)
SODIUM: 134 mmol/L — AB (ref 135–145)

## 2014-05-12 LAB — I-STAT TROPONIN, ED
TROPONIN I, POC: 0 ng/mL (ref 0.00–0.08)
TROPONIN I, POC: 0 ng/mL (ref 0.00–0.08)

## 2014-05-12 MED ORDER — FENTANYL CITRATE 0.05 MG/ML IJ SOLN
50.0000 ug | Freq: Once | INTRAMUSCULAR | Status: AC
  Administered 2014-05-12: 50 ug via INTRAVENOUS
  Filled 2014-05-12: qty 2

## 2014-05-12 MED ORDER — ONDANSETRON HCL 4 MG/2ML IJ SOLN
4.0000 mg | Freq: Once | INTRAMUSCULAR | Status: AC
  Administered 2014-05-12: 4 mg via INTRAVENOUS
  Filled 2014-05-12: qty 2

## 2014-05-12 NOTE — ED Notes (Signed)
Pt states she has been having pain in the middle of her chest for the past two days  States she has been having nausea, vomiting, and diarrhea too  Pt states she has a place on her right hand that she thinks something bit her and she has swelling and redness to the right hand and arm  Pt has hx of heart attack and stroke in the past  Pt states [facility] was [facility] was discharged went home and tried to lay down but the pain got worse

## 2014-05-12 NOTE — Progress Notes (Signed)
I was able to get in touch with patient.  She states that she returned to [facility] ED for Chest Pain and was d/c this morning at 7:00. She is now home and feels better.  They gave her Lovenox and Coumadin.   I will schedule for visit with Dr. Alexandria Lodge on Monday.  She agrees and will call if she has any further problems. Dr. Rogelia Boga aware.

## 2014-05-12 NOTE — Discharge Instructions (Signed)

## 2014-05-12 NOTE — Progress Notes (Signed)
Patient called at the listed number in chart.  No answer and no way to leave a message. I was trying to reach pt and have her come in for Labs today.  Will try again.

## 2014-05-12 NOTE — ED Provider Notes (Signed)
CSN: [id number]     Arrival date & time 05/12/14  0102 History   First MD Initiated Contact with Patient 05/12/14 0545     Chief Complaint  Patient presents with  . Chest Pain     (Consider location/radiation/quality/duration/timing/severity/associated sxs/prior Treatment) HPI  This is a 44 year old female who was seen in the [facility] ED yesterday for chest pain and discharged home after a workup that included negative troponins and are unchanged EKG. She was found to be subtherapeutic on her Coumadin and was discharged on Lovenox. A CT for evaluation of PE was deemed not indicated as she has had multiple negative CTs in the past. She was also discharged on Keflex for cellulitis associated with a bite on her right hand.   She returns because she continues to have chest pain which she describes as a squeezing which is worse with taking a deep breath but not with palpation. She states it is improved about a 6 out of 10 at this time, but this is the same rating she gave it when seen yesterday. She is more concerned about having a headache and nausea and vomiting and not being able to keep her medications down. She was given Zofran yesterday with improvement but now is nauseated again.   She has not gotten her prescriptions for Lovenox, Keflex or Zofran filled yet. She will fill them later this morning.  Past Medical History  Diagnosis Date  . Stroke     assoc with short term memory loss and right peripheral vision loss; age 43   . Blood transfusion   . Anxiety   . Coronary artery disease     Apical LAD infarction '00; NSTEMI s/p BMS to prox LAD '09; Cath 12/2011 single vessel CAD w/ patent LAD stent w/ stable mild ISR, nl LV systolic fxn  . Anemia   . Antithrombin III deficiency     ?pt not sure if true diagnosis  . Tobacco abuse   . High blood pressure   . Myocardial infarction    Past Surgical History  Procedure Laterality Date  . Tubal ligation    . Cardiac catheterization    .  Esophagogastroduodenoscopy N/A 06/09/2012    Procedure: ESOPHAGOGASTRODUODENOSCOPY (EGD);  Surgeon: Theda Belfast, MD;  Location: [facility] ENDOSCOPY;  Service: Endoscopy;  Laterality: N/A;  . Coronary angioplasty    . Esophagogastroduodenoscopy N/A 08/02/2013    Procedure: ESOPHAGOGASTRODUODENOSCOPY (EGD);  Surgeon: Meryl Dare, MD;  Location: [facility] ENDOSCOPY;  Service: Endoscopy;  Laterality: N/A;  . Left heart catheterization with coronary angiogram N/A 12/24/2011    Procedure: LEFT HEART CATHETERIZATION WITH CORONARY ANGIOGRAM;  Surgeon: Kathleene Hazel, MD;  Location: [facility] CATH LAB;  Service: Cardiovascular;  Laterality: N/A;  . Colonoscopy N/A 03/29/2014    Procedure: COLONOSCOPY;  Surgeon: Louis Meckel, MD;  Location: [facility] ENDOSCOPY;  Service: Endoscopy;  Laterality: N/A;   Family History  Problem Relation Age of Onset  . Heart disease Brother     arrhythmia; died  . Breast cancer Maternal Aunt    History  Substance Use Topics  . Smoking status: Current Every Day Smoker -- 0.20 packs/day for 1 years    Types: Cigarettes  . Smokeless tobacco: Never Used  . Alcohol Use: 0.0 oz/week    0 Cans of beer per week     Comment: occasion   OB History    No data available     Review of Systems  All other systems reviewed and are negative.  Allergies  Review of patient's allergies indicates no known allergies.  Home Medications   Prior to Admission medications   Medication Sig Start Date End Date Taking? Authorizing Provider  atorvastatin (LIPITOR) 40 MG tablet Take 2 tablets (80 mg total) by mouth daily. Patient taking differently: Take 80 mg by mouth at bedtime.  02/21/14 02/21/15 Yes Tasrif Ahmed, MD  cephALEXin (KEFLEX) 500 MG capsule Take 1 capsule (500 mg total) by mouth 4 (four) times daily. 05/11/14  Yes Renne Crigler, PA-C  enoxaparin (LOVENOX) 120 MG/0.8ML injection Inject 0.76 mLs (115 mg total) into the skin daily. 05/11/14  Yes Renne Crigler, PA-C  metoprolol succinate  (TOPROL XL) 25 MG 24 hr tablet Take 1 tablet (25 mg total) by mouth daily. 03/03/14  Yes Lewayne Bunting, MD  nitroGLYCERIN (NITROSTAT) 0.4 MG SL tablet Place 1 tablet (0.4 mg total) under the tongue every 5 (five) minutes as needed for chest pain (up to 3 doses). 10/14/13  Yes Beatrice Lecher, PA-C  ondansetron (ZOFRAN ODT) 4 MG disintegrating tablet Take 1 tablet (4 mg total) by mouth every 8 (eight) hours as needed for nausea or vomiting. 05/11/14  Yes Renne Crigler, PA-C  warfarin (COUMADIN) 7.5 MG tablet Take 1 tablet (7.5 mg total) by mouth one time only at 6 PM. 03/30/14  Yes Courtney Paris, MD  acetaminophen (TYLENOL) 325 MG tablet Take 2 tablets (650 mg total) by mouth every 6 (six) hours as needed for moderate pain, fever or headache. Patient not taking: Reported on 05/12/2014 02/21/14   Hyacinth Meeker, MD  hydrocortisone (ANUSOL-HC) 25 MG suppository Place 1 suppository (25 mg total) rectally 2 (two) times daily. Patient not taking: Reported on 05/12/2014 03/30/14   Jill Alexanders, MD  oxyCODONE-acetaminophen (ROXICET) 5-325 MG per tablet Take 1-2 tablets by mouth every 4 (four) hours as needed for severe pain. Patient not taking: Reported on 05/12/2014 03/30/14   Courtney Paris, MD  pantoprazole (PROTONIX) 40 MG tablet Take 1 tablet (40 mg total) by mouth daily. Patient not taking: Reported on 05/12/2014 03/30/14   Courtney Paris, MD  predniSONE (DELTASONE) 20 MG tablet Take 3 tablets (60 mg total) by mouth daily with breakfast. Patient not taking: Reported on 05/12/2014 03/30/14   Courtney Paris, MD   BP 139/98 mmHg  Pulse 77  Temp(Src) 99.2 F (37.3 C) (Oral)  Resp 18  SpO2 100%   Physical Exam  General: Well-developed, well-nourished female in no acute distress; appearance consistent with age of record HENT: normocephalic; atraumatic Eyes: pupils equal, round and reactive to light; extraocular muscles intact Neck: supple Heart: regular rate and rhythm; no murmur Chest: Nontender Lungs: clear  to auscultation bilaterally Abdomen: soft; nondistended; nontender; no masses or hepatosplenomegaly; bowel sounds present Extremities: No deformity; full range of motion; pulses normal Neurologic: Awake, alert and oriented; motor function intact in all extremities and symmetric; no facial droop Skin: Warm and dry; small erythematous papule of right hand with some surrounding erythema but no tenderness Psychiatric: Normal mood and affect   ED Course  Procedures (including critical care time)   EKG Interpretation   Date/Time:  Friday May 12 2014 01:13:37 EST Ventricular Rate:  88 PR Interval:  120 QRS Duration: 83 QT Interval:  378 QTC Calculation: 457 R Axis:   82 Text Interpretation:  Sinus rhythm Anteroseptal infarct, old No  significant change was found Confirmed by Read Drivers  MD, Jonny Ruiz ([id number]) on  05/12/2014 4:56:36 AM      MDM   Nursing  notes and vitals signs, including pulse oximetry, reviewed.  Summary of this visit's results, reviewed by myself:  Labs:  Results for orders placed or performed during the hospital encounter of 05/12/14 (from the past 24 hour(s))  CBC     Status: Abnormal   Collection Time: 05/12/14  1:46 AM  Result Value Ref Range   WBC 6.1 4.0 - 10.5 K/uL   RBC 3.62 (L) 3.87 - 5.11 MIL/uL   Hemoglobin 10.8 (L) 12.0 - 15.0 g/dL   HCT 16.1 (L) 09.6 - 04.5 %   MCV 93.6 78.0 - 100.0 fL   MCH 29.8 26.0 - 34.0 pg   MCHC 31.9 30.0 - 36.0 g/dL   RDW 40.9 (H) 81.1 - 91.4 %   Platelets 270 150 - 400 K/uL  Basic metabolic panel     Status: Abnormal   Collection Time: 05/12/14  1:46 AM  Result Value Ref Range   Sodium 134 (L) 135 - 145 mmol/L   Potassium 3.3 (L) 3.5 - 5.1 mmol/L   Chloride 102 96 - 112 mmol/L   CO2 23 19 - 32 mmol/L   Glucose, Bld 116 (H) 70 - 99 mg/dL   BUN 6 6 - 23 mg/dL   Creatinine, Ser 7.82 0.50 - 1.10 mg/dL   Calcium 8.6 8.4 - 95.6 mg/dL   GFR calc non Af Amer >90 >90 mL/min   GFR calc Af Amer >90 >90 mL/min   Anion gap 9 5  - 15  I-stat troponin, ED (not at [facility])     Status: None   Collection Time: 05/12/14  1:57 AM  Result Value Ref Range   Troponin i, poc 0.00 0.00 - 0.08 ng/mL   Comment 3          I-stat troponin, ED     Status: None   Collection Time: 05/12/14  6:11 AM  Result Value Ref Range   Troponin i, poc 0.00 0.00 - 0.08 ng/mL   Comment 3            Imaging Studies: Dg Chest 2 View  05/11/2014   CLINICAL DATA:  Chest pain.  Smoker  EXAM: CHEST  2 VIEW  COMPARISON:  03/26/2014  FINDINGS: The heart size and mediastinal contours are within normal limits. Both lungs are clear. The visualized skeletal structures are unremarkable.  IMPRESSION: No active cardiopulmonary disease.   Electronically Signed   By: Signa Kell M.D.   On: 05/11/2014 17:15   6:12 AM Nausea improved after IV Zofran.     Hanley Seamen, MD 05/12/14 [phone]

## 2014-05-12 NOTE — ED Notes (Signed)
Apologized to patient about wait time, explained a provider will be in as soon as possible. No needs voiced at this time.

## 2014-05-15 ENCOUNTER — Ambulatory Visit (INDEPENDENT_AMBULATORY_CARE_PROVIDER_SITE_OTHER): Payer: Medicaid Other

## 2014-05-15 DIAGNOSIS — E785 Hyperlipidemia, unspecified: Secondary | ICD-10-CM

## 2014-05-25 ENCOUNTER — Emergency Department (HOSPITAL_COMMUNITY)
Admission: EM | Admit: 2014-05-25 | Discharge: 2014-05-25 | Disposition: A | Discharge status: Home / Self Care | Payer: Medicaid Other | Attending: Emergency Medicine | Admitting: Emergency Medicine

## 2014-05-25 ENCOUNTER — Emergency Department (HOSPITAL_COMMUNITY): Payer: Medicaid Other

## 2014-05-25 ENCOUNTER — Encounter (HOSPITAL_COMMUNITY): Payer: Self-pay | Admitting: Physical Medicine and Rehabilitation

## 2014-05-25 DIAGNOSIS — Z862 Personal history of diseases of the blood and blood-forming organs and certain disorders involving the immune mechanism: Secondary | ICD-10-CM | POA: Diagnosis not present

## 2014-05-25 DIAGNOSIS — H5712 Ocular pain, left eye: Secondary | ICD-10-CM | POA: Diagnosis present

## 2014-05-25 DIAGNOSIS — I2510 Atherosclerotic heart disease of native coronary artery without angina pectoris: Secondary | ICD-10-CM | POA: Diagnosis not present

## 2014-05-25 DIAGNOSIS — I252 Old myocardial infarction: Secondary | ICD-10-CM | POA: Insufficient documentation

## 2014-05-25 DIAGNOSIS — Z8659 Personal history of other mental and behavioral disorders: Secondary | ICD-10-CM | POA: Insufficient documentation

## 2014-05-25 DIAGNOSIS — Z9861 Coronary angioplasty status: Secondary | ICD-10-CM | POA: Diagnosis not present

## 2014-05-25 DIAGNOSIS — H05012 Cellulitis of left orbit: Secondary | ICD-10-CM | POA: Diagnosis not present

## 2014-05-25 DIAGNOSIS — Z7901 Long term (current) use of anticoagulants: Secondary | ICD-10-CM | POA: Diagnosis not present

## 2014-05-25 DIAGNOSIS — L03213 Periorbital cellulitis: Secondary | ICD-10-CM

## 2014-05-25 DIAGNOSIS — Z79899 Other long term (current) drug therapy: Secondary | ICD-10-CM | POA: Diagnosis not present

## 2014-05-25 DIAGNOSIS — Z8673 Personal history of transient ischemic attack (TIA), and cerebral infarction without residual deficits: Secondary | ICD-10-CM | POA: Diagnosis not present

## 2014-05-25 DIAGNOSIS — R791 Abnormal coagulation profile: Secondary | ICD-10-CM | POA: Diagnosis not present

## 2014-05-25 DIAGNOSIS — Z792 Long term (current) use of antibiotics: Secondary | ICD-10-CM | POA: Insufficient documentation

## 2014-05-25 DIAGNOSIS — Z720 Tobacco use: Secondary | ICD-10-CM | POA: Diagnosis not present

## 2014-05-25 LAB — CBC WITH DIFFERENTIAL/PLATELET
BASOS ABS: 0.1 10*3/uL (ref 0.0–0.1)
Basophils Relative: 1 % (ref 0–1)
EOS ABS: 0.2 10*3/uL (ref 0.0–0.7)
Eosinophils Relative: 3 % (ref 0–5)
HCT: 32 % — ABNORMAL LOW (ref 36.0–46.0)
Hemoglobin: 10.7 g/dL — ABNORMAL LOW (ref 12.0–15.0)
Lymphocytes Relative: 24 % (ref 12–46)
Lymphs Abs: 1.3 10*3/uL (ref 0.7–4.0)
MCH: 30 pg (ref 26.0–34.0)
MCHC: 33.4 g/dL (ref 30.0–36.0)
MCV: 89.6 fL (ref 78.0–100.0)
Monocytes Absolute: 0.4 10*3/uL (ref 0.1–1.0)
Monocytes Relative: 8 % (ref 3–12)
Neutro Abs: 3.6 10*3/uL (ref 1.7–7.7)
Neutrophils Relative %: 64 % (ref 43–77)
Platelets: 239 10*3/uL (ref 150–400)
RBC: 3.57 MIL/uL — ABNORMAL LOW (ref 3.87–5.11)
RDW: 16.6 % — AB (ref 11.5–15.5)
WBC: 5.5 10*3/uL (ref 4.0–10.5)

## 2014-05-25 LAB — BASIC METABOLIC PANEL
Anion gap: 6 (ref 5–15)
BUN: 8 mg/dL (ref 6–23)
CO2: 28 mmol/L (ref 19–32)
Calcium: 8.8 mg/dL (ref 8.4–10.5)
Chloride: 105 mmol/L (ref 96–112)
Creatinine, Ser: 0.51 mg/dL (ref 0.50–1.10)
GFR calc Af Amer: >90 mL/min (ref >90)
GLUCOSE: 111 mg/dL — AB (ref 70–99)
Potassium: 3.2 mmol/L — ABNORMAL LOW (ref 3.5–5.1)
Sodium: 139 mmol/L (ref 135–145)

## 2014-05-25 LAB — PROTIME-INR
INR: 1.04 (ref 0.00–1.49)
PROTHROMBIN TIME: 13.7 s (ref 11.6–15.2)

## 2014-05-25 MED ORDER — ENOXAPARIN SODIUM 100 MG/ML ~~LOC~~ SOLN
1.0000 mg/kg | Freq: Once | SUBCUTANEOUS | Status: AC
  Administered 2014-05-25: 75 mg via SUBCUTANEOUS
  Filled 2014-05-25: qty 1

## 2014-05-25 MED ORDER — IOHEXOL 300 MG/ML  SOLN
75.0000 mL | Freq: Once | INTRAMUSCULAR | Status: AC | PRN
  Administered 2014-05-25: 75 mL via INTRAVENOUS

## 2014-05-25 MED ORDER — ENOXAPARIN SODIUM 100 MG/ML ~~LOC~~ SOLN: 1.0000 mg/kg | SUBCUTANEOUS | Status: DC

## 2014-05-25 MED ORDER — CLINDAMYCIN HCL 150 MG PO CAPS: 300.0000 mg | ORAL_CAPSULE | Freq: Three times a day (TID) | ORAL | Status: DC

## 2014-05-25 MED ORDER — CLINDAMYCIN PHOSPHATE 600 MG/50ML IV SOLN
600.0000 mg | Freq: Once | INTRAVENOUS | Status: AC
  Administered 2014-05-25: 600 mg via INTRAVENOUS
  Filled 2014-05-25: qty 50

## 2014-05-25 MED ORDER — MORPHINE SULFATE 4 MG/ML IJ SOLN
4.0000 mg | Freq: Once | INTRAMUSCULAR | Status: AC
  Administered 2014-05-25: 4 mg via INTRAVENOUS
  Filled 2014-05-25: qty 1

## 2014-05-25 MED ORDER — ONDANSETRON HCL 4 MG PO TABS: 4.0000 mg | ORAL_TABLET | Freq: Four times a day (QID) | ORAL | Status: DC

## 2014-05-25 MED ORDER — ONDANSETRON HCL 4 MG/2ML IJ SOLN
4.0000 mg | INTRAMUSCULAR | Status: AC
  Administered 2014-05-25: 4 mg via INTRAVENOUS
  Filled 2014-05-25: qty 2

## 2014-05-25 NOTE — ED Notes (Signed)
Patient transported to CT 

## 2014-05-25 NOTE — ED Notes (Signed)
Pt reports L eye pain and swelling. Ongoing x2 days. Reports swelling increased today, unable to open eye. Thinks foreign object could be in her eye or possible insect bite. Respirations unlabored. NAD.

## 2014-05-25 NOTE — ED Provider Notes (Signed)
CSN: [id number]     Arrival date & time 05/25/14  0751 History   First MD Initiated Contact with Patient 05/25/14 0759     Chief Complaint  Patient presents with  . Eye Pain   (Consider location/radiation/quality/duration/timing/severity/associated sxs/prior Treatment) HPI  Lindsey [name] is a 44 year old female presenting with left periorbital and facial swelling. She first noticed this swelling yesterday morning, however it progressively worsened throughout the day. Initially she was still able to open her eye however around 3 AM the pain worsened and she could no longer open her left eye. She rates the pain as 7/10. She denies any injury to the eye or to the surrounding tissue.  Past Medical History  Diagnosis Date  . Stroke     assoc with short term memory loss and right peripheral vision loss; age 17   . Blood transfusion   . Anxiety   . Coronary artery disease     Apical LAD infarction '00; NSTEMI s/p BMS to prox LAD '09; Cath 12/2011 single vessel CAD w/ patent LAD stent w/ stable mild ISR, nl LV systolic fxn  . Anemia   . Antithrombin III deficiency     ?pt not sure if true diagnosis  . Tobacco abuse   . High blood pressure   . Myocardial infarction    Past Surgical History  Procedure Laterality Date  . Tubal ligation    . Cardiac catheterization    . Esophagogastroduodenoscopy N/A 06/09/2012    Procedure: ESOPHAGOGASTRODUODENOSCOPY (EGD);  Surgeon: Theda Belfast, MD;  Location: [facility];  Service: Endoscopy;  Laterality: N/A;  . Coronary angioplasty    . Esophagogastroduodenoscopy N/A 08/02/2013    Procedure: ESOPHAGOGASTRODUODENOSCOPY (EGD);  Surgeon: Meryl Dare, MD;  Location: [facility] ENDOSCOPY;  Service: Endoscopy;  Laterality: N/A;  . Left heart catheterization with coronary angiogram N/A 12/24/2011    Procedure: LEFT HEART CATHETERIZATION WITH CORONARY ANGIOGRAM;  Surgeon: Kathleene Hazel, MD;  Location: [facility] CATH LAB;  Service: Cardiovascular;  Laterality:  N/A;  . Colonoscopy N/A 03/29/2014    Procedure: COLONOSCOPY;  Surgeon: Louis Meckel, MD;  Location: [facility] ENDOSCOPY;  Service: Endoscopy;  Laterality: N/A;   Family History  Problem Relation Age of Onset  . Heart disease Brother     arrhythmia; died  . Breast cancer Maternal Aunt    History  Substance Use Topics  . Smoking status: Current Every Day Smoker -- 0.20 packs/day for 1 years    Types: Cigarettes  . Smokeless tobacco: Never Used  . Alcohol Use: 0.0 oz/week    0 Cans of beer per week   OB History    No data available     Review of Systems  Constitutional: Negative for fever and chills.  HENT: Positive for facial swelling. Negative for sore throat.   Eyes: Negative for redness and visual disturbance.  Respiratory: Negative for cough and shortness of breath.   Cardiovascular: Negative for chest pain and leg swelling.  Gastrointestinal: Negative for nausea, vomiting and diarrhea.  Genitourinary: Negative for dysuria.  Musculoskeletal: Negative for myalgias.  Skin: Negative for rash.  Neurological: Negative for weakness, numbness and headaches.      Allergies  Review of patient's allergies indicates no known allergies.  Home Medications   Prior to Admission medications   Medication Sig Start Date End Date Taking? Authorizing Provider  acetaminophen (TYLENOL) 325 MG tablet Take 2 tablets (650 mg total) by mouth every 6 (six) hours as needed for moderate pain, fever or headache.  Patient not taking: Reported on 05/12/2014 02/21/14   Hyacinth Meeker, MD  atorvastatin (LIPITOR) 40 MG tablet Take 2 tablets (80 mg total) by mouth daily. Patient taking differently: Take 80 mg by mouth at bedtime.  02/21/14 02/21/15  Tasrif Ahmed, MD  cephALEXin (KEFLEX) 500 MG capsule Take 1 capsule (500 mg total) by mouth 4 (four) times daily. 05/11/14   Renne Crigler, PA-C  enoxaparin (LOVENOX) 120 MG/0.8ML injection Inject 0.76 mLs (115 mg total) into the skin daily. 05/11/14   Renne Crigler,  PA-C  hydrocortisone (ANUSOL-HC) 25 MG suppository Place 1 suppository (25 mg total) rectally 2 (two) times daily. Patient not taking: Reported on 05/12/2014 03/30/14   Alexa Dulcy Fanny, MD  metoprolol succinate (TOPROL XL) 25 MG 24 hr tablet Take 1 tablet (25 mg total) by mouth daily. 03/03/14   Lewayne Bunting, MD  nitroGLYCERIN (NITROSTAT) 0.4 MG SL tablet Place 1 tablet (0.4 mg total) under the tongue every 5 (five) minutes as needed for chest pain (up to 3 doses). 10/14/13   Beatrice Lecher, PA-C  ondansetron (ZOFRAN ODT) 4 MG disintegrating tablet Take 1 tablet (4 mg total) by mouth every 8 (eight) hours as needed for nausea or vomiting. 05/11/14   Renne Crigler, PA-C  oxyCODONE-acetaminophen (ROXICET) 5-325 MG per tablet Take 1-2 tablets by mouth every 4 (four) hours as needed for severe pain. Patient not taking: Reported on 05/12/2014 03/30/14   Courtney Paris, MD  pantoprazole (PROTONIX) 40 MG tablet Take 1 tablet (40 mg total) by mouth daily. Patient not taking: Reported on 05/12/2014 03/30/14   Courtney Paris, MD  predniSONE (DELTASONE) 20 MG tablet Take 3 tablets (60 mg total) by mouth daily with breakfast. Patient not taking: Reported on 05/12/2014 03/30/14   Courtney Paris, MD  warfarin (COUMADIN) 7.5 MG tablet Take 1 tablet (7.5 mg total) by mouth one time only at 6 PM. 03/30/14   Courtney Paris, MD   BP 123/92 mmHg  Pulse 84  Temp(Src) 98.8 F (37.1 C) (Oral)  Resp 18  SpO2 100% Physical Exam  Constitutional: She appears well-developed and well-nourished. No distress.  HENT:  Head: Normocephalic and atraumatic.    Mouth/Throat: Oropharynx is clear and moist.  Redness, swelling and induration surrounding to left eye, including eye lid and left cheek  Eyes: Conjunctivae are normal. Pupils are equal, round, and reactive to light. Right eye exhibits no discharge. Left eye exhibits no discharge.  No signs of entrapment, reports pain with EOMs  Neck: Neck supple. No thyromegaly present.    Cardiovascular: Normal rate, regular rhythm and intact distal pulses.   Pulmonary/Chest: Effort normal and breath sounds normal. No respiratory distress. She has no wheezes. She has no rales. She exhibits no tenderness.  Abdominal: Soft. There is no tenderness.  Musculoskeletal: She exhibits no tenderness.  Lymphadenopathy:    She has no cervical adenopathy.  Neurological: She is alert.  Skin: Skin is warm and dry. No rash noted. She is not diaphoretic.  Psychiatric: She has a normal mood and affect.  Nursing note and vitals reviewed.   ED Course  Procedures (including critical care time) Labs Review Labs Reviewed  CBC WITH DIFFERENTIAL/PLATELET - Abnormal; Notable for the following:    RBC 3.57 (*)    Hemoglobin 10.7 (*)    HCT 32.0 (*)    RDW 16.6 (*)    All other components within normal limits  BASIC METABOLIC PANEL - Abnormal; Notable for the following:    Potassium  3.2 (*)    Glucose, Bld 111 (*)    All other components within normal limits  PROTIME-INR    Imaging Review Ct Orbits W/cm  05/25/2014   CLINICAL DATA:  Left eye swelling for 2 days  EXAM: CT ORBITS WITH CONTRAST  TECHNIQUE: Multidetector CT imaging of the orbits was performed following the bolus administration of intravenous contrast.  CONTRAST:  75mL OMNIPAQUE IOHEXOL 300 MG/ML  SOLN  COMPARISON:  02/20/2014  FINDINGS: There is abnormal asymmetric left-sided preseptal soft tissue swelling and skin thickening, image number 21/series 2. No fluid collection identified. The left intraorbital contents appear normal. The retrobulbar fat is well preserved.  The paranasal sinuses and mastoid air cells appear clear.  The visualized bony structures are unremarkable. No bone erosions identified.  The visualized intracranial contents appear within normal limits.  IMPRESSION: 1. Left-sided preseptal cellulitis.  No abscess identified.   Electronically Signed   By: Signa Kell M.D.   On: 05/25/2014 11:29     EKG  Interpretation None      MDM   Final diagnoses:  Periorbital cellulitis  Subtherapeutic international normalized ratio (INR)    44 yo with peri-orbital cellulitis, exam initially concerning for orbital cellulitis due to pain with EOMs. CBC, BMP, INR.  NS bolus, IV clindamycin and CT orbits done . Case discussed with Dr. Micheline Maze.  Pain and swelling improved after treatment.  Pain with EOMs resolved, no signs of entrapment.  CT shows only pre-septal cellulitis and confirmed no intraorbital extension or abscess. Her INR is not therapeutic so bridging with lovenox initiated. Prescription for abx provided.  Pt is well-appearing, in no acute distress and vital signs reviewed and not concerning.  She appears safe to be discharged.  Discharge include follow-up with their PCP for re-check of cellulitis and INR later this week. Return precautions provided.  Pt aware of plan and in agreement.     Filed Vitals:   05/25/14 1045 05/25/14 1112 05/25/14 1130 05/25/14 1215  BP: 121/86 121/86 126/73 121/87  Pulse: 76 77 80 80  Temp:      TempSrc:      Resp:  16    SpO2: 98% 100% 98% 100%   Meds given in ED:  Medications  clindamycin (CLEOCIN) IVPB 600 mg (0 mg Intravenous Stopped 05/25/14 0943)  morphine 4 MG/ML injection 4 mg (4 mg Intravenous Given 05/25/14 0944)  ondansetron (ZOFRAN) injection 4 mg (4 mg Intravenous Given 05/25/14 0944)  iohexol (OMNIPAQUE) 300 MG/ML solution 75 mL (75 mLs Intravenous Contrast Given 05/25/14 1014)  enoxaparin (LOVENOX) injection 75 mg (75 mg Subcutaneous Given 05/25/14 1131)  morphine 4 MG/ML injection 4 mg (4 mg Intravenous Given 05/25/14 1100)    Discharge Medication List as of 05/25/2014 12:07 PM    START taking these medications   Details  clindamycin (CLEOCIN) 150 MG capsule Take 2 capsules (300 mg total) by mouth 3 (three) times daily. May dispense as  capsules, Starting 05/25/2014, Until Discontinued, Print    enoxaparin (LOVENOX) 100 MG/ML injection  Inject 0.75 mLs (75 mg total) into the skin daily., Starting 05/25/2014, Until Discontinued, Print    ondansetron (ZOFRAN) 4 MG tablet Take 1 tablet (4 mg total) by mouth every 6 (six) hours., Starting 05/25/2014, Until Discontinued, Print           Harle Battiest, NP 05/25/14 2346  Toy Cookey, MD 05/26/14 [phone]

## 2014-05-25 NOTE — Discharge Instructions (Signed)
Please follow the directions provided. Be sure to follow-up the next 2-3 days to ensure your eyes getting better. Please take the antibiotics as directed until they're all gone. Give yourself the lovenox injections daily as directed and take your coumadin.  You will need to be rechecked in a week to make sure your blood levels are therapeutic.  Don't hesitate to return for any new, worsing or concerning symptoms.     SEEK IMMEDIATE MEDICAL CARE IF:  Your eyelids become more painful, red, warm, or swollen.  You develop double vision or your vision becomes blurred or worsens in any way.  You have trouble moving your eyes.  The eye looks like it is popping out (proptosis).  You develop a severe headache, severe neck pain, or neck stiffness.  You develop repeated vomiting.  You have a fever or persistent symptoms for more than 72 hours.  You have a fever and your symptoms suddenly get worse.

## 2014-05-31 NOTE — Progress Notes (Signed)
HPI: FU CAD; s/p apical infarct in 2000 and NSTEMI in 2009 tx with BMS to LAD, prior CVA, ? Anti-thrombin III deficiency, HTN, HL. Admitted in 07/2013 with pancreatitis. Echo demonstrated apical mural thrombus. Placed on coumadin. ASA and Plavix were d/c'd. Monitor 12/15 showed sinus to sinus tachycardia with PVCs. CTA 12/15 showed no pulmonary embolus. Seen in ER recently for CP; troponin normal. Since last seen, there is no dyspnea, chest pain, palpitations or syncope.   Studies: - LHC (12/2011): pLAD stent ok with 30-40%, mLAD with myocardial bridging, EF 55%, apical akinesis with apical aneurysm (no change) >>> med Rx.  - Echo (12/15): ejection fraction 50-55%. No apical thrombus. - carotid Dopplers 02/20/2014-1-39% bilateral stenosis.  Current Outpatient Prescriptions  Medication Sig Dispense Refill  . acetaminophen (TYLENOL) 325 MG tablet Take 2 tablets (650 mg total) by mouth every 6 (six) hours as needed for moderate pain, fever or headache. 30 tablet 0  . atorvastatin (LIPITOR) 40 MG tablet Take 2 tablets (80 mg total) by mouth daily. (Patient taking differently: Take 80 mg by mouth at bedtime. ) 90 tablet 11  . cephALEXin (KEFLEX) 500 MG capsule Take 1 capsule (500 mg total) by mouth 4 (four) times daily. 28 capsule 0  . clindamycin (CLEOCIN) 150 MG capsule Take 2 capsules (300 mg total) by mouth 3 (three) times daily. May dispense as  capsules 60 capsule 0  . enoxaparin (LOVENOX) 100 MG/ML injection Inject 0.75 mLs (75 mg total) into the skin daily. 7 Syringe 0  . hydrocortisone (ANUSOL-HC) 25 MG suppository Place 1 suppository (25 mg total) rectally 2 (two) times daily. (Patient not taking: Reported on 05/12/2014) 12 suppository 3  . metoprolol succinate (TOPROL XL) 25 MG 24 hr tablet Take 1 tablet (25 mg total) by mouth daily. 30 tablet 12  . nitroGLYCERIN (NITROSTAT) 0.4 MG SL tablet Place 1 tablet (0.4 mg total) under the tongue every 5 (five) minutes as needed  for chest pain (up to 3 doses). 25 tablet 3  . ondansetron (ZOFRAN ODT) 4 MG disintegrating tablet Take 1 tablet (4 mg total) by mouth every 8 (eight) hours as needed for nausea or vomiting. 10 tablet 0  . ondansetron (ZOFRAN) 4 MG tablet Take 1 tablet (4 mg total) by mouth every 6 (six) hours. 12 tablet 0  . oxyCODONE-acetaminophen (ROXICET) 5-325 MG per tablet Take 1-2 tablets by mouth every 4 (four) hours as needed for severe pain. 40 tablet 0  . pantoprazole (PROTONIX) 40 MG tablet Take 1 tablet (40 mg total) by mouth daily. (Patient not taking: Reported on 05/12/2014) 30 tablet 2  . predniSONE (DELTASONE) 20 MG tablet Take 3 tablets (60 mg total) by mouth daily with breakfast. (Patient not taking: Reported on 05/12/2014) 12 tablet 0  . warfarin (COUMADIN) 7.5 MG tablet Take 1 tablet (7.5 mg total) by mouth one time only at 6 PM. 30 tablet 1   No current facility-administered medications for this visit.     Past Medical History  Diagnosis Date  . Stroke     assoc with short term memory loss and right peripheral vision loss; age 44   . Blood transfusion   . Anxiety   . Coronary artery disease     Apical LAD infarction '00; NSTEMI s/p BMS to prox LAD '09; Cath 12/2011 single vessel CAD w/ patent LAD stent w/ stable mild ISR, nl LV systolic fxn  . Anemia   . Antithrombin III deficiency     ?pt  not sure if true diagnosis  . Tobacco abuse   . High blood pressure   . Myocardial infarction     Past Surgical History  Procedure Laterality Date  . Tubal ligation    . Cardiac catheterization    . Esophagogastroduodenoscopy N/A 06/09/2012    Procedure: ESOPHAGOGASTRODUODENOSCOPY (EGD);  Surgeon: Theda Belfast, MD;  Location: [facility] ENDOSCOPY;  Service: Endoscopy;  Laterality: N/A;  . Coronary angioplasty    . Esophagogastroduodenoscopy N/A 08/02/2013    Procedure: ESOPHAGOGASTRODUODENOSCOPY (EGD);  Surgeon: Meryl Dare, MD;  Location: [facility] ENDOSCOPY;  Service: Endoscopy;  Laterality: N/A;  .  Left heart catheterization with coronary angiogram N/A 12/24/2011    Procedure: LEFT HEART CATHETERIZATION WITH CORONARY ANGIOGRAM;  Surgeon: Kathleene Hazel, MD;  Location: [facility] CATH LAB;  Service: Cardiovascular;  Laterality: N/A;  . Colonoscopy N/A 03/29/2014    Procedure: COLONOSCOPY;  Surgeon: Louis Meckel, MD;  Location: [facility] ENDOSCOPY;  Service: Endoscopy;  Laterality: N/A;    History   Social History  . Marital Status: Divorced    Spouse Name: N/A  . Number of Children: 2  . Years of Education: N/A   Occupational History  . Not on file.   Social History Main Topics  . Smoking status: Current Every Day Smoker -- 0.20 packs/day for 1 years    Types: Cigarettes  . Smokeless tobacco: Never Used  . Alcohol Use: 0.0 oz/week    0 Cans of beer per week  . Drug Use: Yes    Special: Cocaine     Comment: hx of use x 1  . Sexual Activity: No   Other Topics Concern  . Not on file   Social History Narrative    ROS: no fevers or chills, productive cough, hemoptysis, dysphasia, odynophagia, melena, hematochezia, dysuria, hematuria, rash, seizure activity, orthopnea, PND, pedal edema, claudication. Remaining systems are negative.  Physical Exam: Well-developed well-nourished in no acute distress.  Skin is warm and dry.  HEENT is normal.  Neck is supple.  Chest is clear to auscultation with normal expansion.  Cardiovascular exam is regular rate and rhythm.  Abdominal exam nontender or distended. No masses palpated. Extremities show no edema. neuro grossly intact  ECG  This encounter was created in error - please disregard.

## 2014-06-02 ENCOUNTER — Encounter: Payer: Medicaid Other | Admitting: Cardiology

## 2014-06-14 ENCOUNTER — Encounter: Payer: Self-pay | Admitting: Cardiology

## 2014-06-14 ENCOUNTER — Emergency Department (HOSPITAL_COMMUNITY)
Admission: EM | Admit: 2014-06-14 | Discharge: 2014-06-15 | Disposition: A | Discharge status: Home / Self Care | Payer: Medicaid Other | Attending: Emergency Medicine | Admitting: Emergency Medicine

## 2014-06-14 ENCOUNTER — Encounter (HOSPITAL_COMMUNITY): Payer: Self-pay

## 2014-06-14 DIAGNOSIS — K649 Unspecified hemorrhoids: Secondary | ICD-10-CM | POA: Diagnosis not present

## 2014-06-14 DIAGNOSIS — Z862 Personal history of diseases of the blood and blood-forming organs and certain disorders involving the immune mechanism: Secondary | ICD-10-CM | POA: Insufficient documentation

## 2014-06-14 DIAGNOSIS — I2510 Atherosclerotic heart disease of native coronary artery without angina pectoris: Secondary | ICD-10-CM | POA: Insufficient documentation

## 2014-06-14 DIAGNOSIS — I10 Essential (primary) hypertension: Secondary | ICD-10-CM | POA: Insufficient documentation

## 2014-06-14 DIAGNOSIS — Z720 Tobacco use: Secondary | ICD-10-CM | POA: Diagnosis not present

## 2014-06-14 DIAGNOSIS — Z8659 Personal history of other mental and behavioral disorders: Secondary | ICD-10-CM | POA: Diagnosis not present

## 2014-06-14 DIAGNOSIS — E876 Hypokalemia: Secondary | ICD-10-CM

## 2014-06-14 DIAGNOSIS — E860 Dehydration: Secondary | ICD-10-CM | POA: Insufficient documentation

## 2014-06-14 DIAGNOSIS — Z7901 Long term (current) use of anticoagulants: Secondary | ICD-10-CM | POA: Diagnosis not present

## 2014-06-14 DIAGNOSIS — Z9889 Other specified postprocedural states: Secondary | ICD-10-CM | POA: Insufficient documentation

## 2014-06-14 DIAGNOSIS — Z9861 Coronary angioplasty status: Secondary | ICD-10-CM | POA: Diagnosis not present

## 2014-06-14 DIAGNOSIS — R079 Chest pain, unspecified: Secondary | ICD-10-CM | POA: Diagnosis not present

## 2014-06-14 DIAGNOSIS — Z8673 Personal history of transient ischemic attack (TIA), and cerebral infarction without residual deficits: Secondary | ICD-10-CM | POA: Insufficient documentation

## 2014-06-14 DIAGNOSIS — R197 Diarrhea, unspecified: Secondary | ICD-10-CM

## 2014-06-14 DIAGNOSIS — R112 Nausea with vomiting, unspecified: Secondary | ICD-10-CM

## 2014-06-14 DIAGNOSIS — I252 Old myocardial infarction: Secondary | ICD-10-CM | POA: Insufficient documentation

## 2014-06-14 NOTE — ED Notes (Signed)
Pt complains of flu like sx, vomiting and her hemmoroids are irritated

## 2014-06-15 ENCOUNTER — Emergency Department (HOSPITAL_COMMUNITY)
Admission: EM | Admit: 2014-06-15 | Discharge: 2014-06-15 | Discharge status: Against Medical Advice | Payer: Medicaid Other | Attending: Emergency Medicine | Admitting: Emergency Medicine

## 2014-06-15 ENCOUNTER — Encounter (HOSPITAL_COMMUNITY): Payer: Self-pay

## 2014-06-15 DIAGNOSIS — I252 Old myocardial infarction: Secondary | ICD-10-CM | POA: Diagnosis not present

## 2014-06-15 DIAGNOSIS — I2510 Atherosclerotic heart disease of native coronary artery without angina pectoris: Secondary | ICD-10-CM | POA: Insufficient documentation

## 2014-06-15 DIAGNOSIS — R079 Chest pain, unspecified: Secondary | ICD-10-CM | POA: Insufficient documentation

## 2014-06-15 DIAGNOSIS — Z720 Tobacco use: Secondary | ICD-10-CM | POA: Insufficient documentation

## 2014-06-15 LAB — CBC WITH DIFFERENTIAL/PLATELET
BASOS ABS: 0.1 10*3/uL (ref 0.0–0.1)
BASOS PCT: 1 % (ref 0–1)
EOS ABS: 0 10*3/uL (ref 0.0–0.7)
Eosinophils Relative: 1 % (ref 0–5)
HCT: 34.2 % — ABNORMAL LOW (ref 36.0–46.0)
HEMOGLOBIN: 10.9 g/dL — AB (ref 12.0–15.0)
Lymphocytes Relative: 37 % (ref 12–46)
Lymphs Abs: 1.6 10*3/uL (ref 0.7–4.0)
MCH: 30.1 pg (ref 26.0–34.0)
MCHC: 31.9 g/dL (ref 30.0–36.0)
MCV: 94.5 fL (ref 78.0–100.0)
Monocytes Absolute: 0.4 10*3/uL (ref 0.1–1.0)
Monocytes Relative: 9 % (ref 3–12)
NEUTROS ABS: 2.3 10*3/uL (ref 1.7–7.7)
Neutrophils Relative %: 52 % (ref 43–77)
PLATELETS: 149 10*3/uL — AB (ref 150–400)
RBC: 3.62 MIL/uL — ABNORMAL LOW (ref 3.87–5.11)
RDW: 17.5 % — ABNORMAL HIGH (ref 11.5–15.5)
WBC: 4.4 10*3/uL (ref 4.0–10.5)

## 2014-06-15 LAB — COMPREHENSIVE METABOLIC PANEL
ALK PHOS: 100 U/L (ref 39–117)
ALT: 21 U/L (ref 0–35)
AST: 37 U/L (ref 0–37)
Albumin: 3.7 g/dL (ref 3.5–5.2)
Anion gap: 11 (ref 5–15)
BILIRUBIN TOTAL: 0.6 mg/dL (ref 0.3–1.2)
CO2: 26 mmol/L (ref 19–32)
CREATININE: 0.63 mg/dL (ref 0.50–1.10)
Calcium: 8.5 mg/dL (ref 8.4–10.5)
Chloride: 100 mmol/L (ref 96–112)
GFR calc non Af Amer: >90 mL/min (ref >90)
Glucose, Bld: 100 mg/dL — ABNORMAL HIGH (ref 70–99)
Potassium: 3.1 mmol/L — ABNORMAL LOW (ref 3.5–5.1)
Sodium: 137 mmol/L (ref 135–145)
Total Protein: 7.5 g/dL (ref 6.0–8.3)

## 2014-06-15 LAB — SAMPLE TO BLOOD BANK

## 2014-06-15 LAB — PROTIME-INR
INR: 0.96 (ref 0.00–1.49)
Prothrombin Time: 12.9 seconds (ref 11.6–15.2)

## 2014-06-15 MED ORDER — HYDROCODONE-ACETAMINOPHEN 5-325 MG PO TABS: 1.0000 | ORAL_TABLET | Freq: Four times a day (QID) | ORAL | Status: DC | PRN

## 2014-06-15 MED ORDER — FENTANYL CITRATE 0.05 MG/ML IJ SOLN
50.0000 ug | INTRAMUSCULAR | Status: DC | PRN
  Administered 2014-06-15: 50 ug via INTRAVENOUS
  Filled 2014-06-15: qty 2

## 2014-06-15 MED ORDER — HYDROCODONE-ACETAMINOPHEN 5-325 MG PO TABS
2.0000 | ORAL_TABLET | Freq: Once | ORAL | Status: AC
  Administered 2014-06-15: 2 via ORAL
  Filled 2014-06-15: qty 2

## 2014-06-15 MED ORDER — ONDANSETRON HCL 4 MG PO TABS: 4.0000 mg | ORAL_TABLET | Freq: Three times a day (TID) | ORAL | Status: DC | PRN

## 2014-06-15 MED ORDER — POTASSIUM CHLORIDE CRYS ER 20 MEQ PO TBCR: 20.0000 meq | EXTENDED_RELEASE_TABLET | Freq: Two times a day (BID) | ORAL | Status: DC

## 2014-06-15 MED ORDER — SODIUM CHLORIDE 0.9 % IV SOLN
1000.0000 mL | Freq: Once | INTRAVENOUS | Status: AC
  Administered 2014-06-15: 1000 mL via INTRAVENOUS

## 2014-06-15 MED ORDER — SODIUM CHLORIDE 0.9 % IV SOLN: 1000.0000 mL | INTRAVENOUS | Status: DC

## 2014-06-15 MED ORDER — HYDROCORTISONE ACE-PRAMOXINE 1-1 % RE FOAM: 1.0000 | Freq: Two times a day (BID) | RECTAL | Status: DC

## 2014-06-15 MED ORDER — ONDANSETRON HCL 4 MG/2ML IJ SOLN
4.0000 mg | Freq: Once | INTRAMUSCULAR | Status: AC
  Administered 2014-06-15: 4 mg via INTRAVENOUS
  Filled 2014-06-15: qty 2

## 2014-06-15 MED ORDER — POTASSIUM CHLORIDE CRYS ER 20 MEQ PO TBCR
40.0000 meq | EXTENDED_RELEASE_TABLET | Freq: Once | ORAL | Status: AC
  Administered 2014-06-15: 40 meq via ORAL
  Filled 2014-06-15: qty 2

## 2014-06-15 NOTE — Discharge Instructions (Signed)
Drink plenty of fluids. Take the medications as directed. You can soak in a tub of warm water for 30 minutes 4 times a day for pain relief from your hemorrhoids. Call [facility] to have your hemorrhoids evaluated today. Avoid fried, spicy or greasy foods. Use the zofran for nausea, you can use imodium OTC for the diarrhea.  Hemorrhoids Hemorrhoids are swollen veins around the rectum or anus. There are two types of hemorrhoids:   Internal hemorrhoids. These occur in the veins just inside the rectum. They may poke through to the outside and become irritated and painful.  External hemorrhoids. These occur in the veins outside the anus and can be felt as a painful swelling or hard lump near the anus. CAUSES  Pregnancy.   Obesity.   Constipation or diarrhea.   Straining to have a bowel movement.   Sitting for long periods on the toilet.  Heavy lifting or other activity that caused you to strain.  Anal intercourse. SYMPTOMS   Pain.   Anal itching or irritation.   Rectal bleeding.   Fecal leakage.   Anal swelling.   One or more lumps around the anus.  DIAGNOSIS  Your caregiver may be able to diagnose hemorrhoids by visual examination. Other examinations or tests that may be performed include:   Examination of the rectal area with a gloved hand (digital rectal exam).   Examination of anal canal using a small tube (scope).   A blood test if you have lost a significant amount of blood.  A test to look inside the colon (sigmoidoscopy or colonoscopy). TREATMENT Most hemorrhoids can be treated at home. However, if symptoms do not seem to be getting better or if you have a lot of rectal bleeding, your caregiver may perform a procedure to help make the hemorrhoids get smaller or remove them completely. Possible treatments include:   Placing a rubber band at the base of the hemorrhoid to cut off the circulation (rubber band ligation).   Injecting a  chemical to shrink the hemorrhoid (sclerotherapy).   Using a tool to burn the hemorrhoid (infrared light therapy).   Surgically removing the hemorrhoid (hemorrhoidectomy).   Stapling the hemorrhoid to block blood flow to the tissue (hemorrhoid stapling).  HOME CARE INSTRUCTIONS   Eat foods with fiber, such as whole grains, beans, nuts, fruits, and vegetables. Ask your doctor about taking products with added fiber in them (fibersupplements).  Increase fluid intake. Drink enough water and fluids to keep your urine clear or pale yellow.   Exercise regularly.   Go to the bathroom when you have the urge to have a bowel movement. Do not wait.   Avoid straining to have bowel movements.   Keep the anal area dry and clean. Use wet toilet paper or moist towelettes after a bowel movement.   Medicated creams and suppositories may be used or applied as directed.   Only take over-the-counter or prescription medicines as directed by your caregiver.   Take warm sitz baths for 15-20 minutes, 3-4 times a day to ease pain and discomfort.   Place ice packs on the hemorrhoids if they are tender and swollen. Using ice packs between sitz baths may be helpful.   Put ice in a plastic bag.   Place a towel between your skin and the bag.   Leave the ice on for 15-20 minutes, 3-4 times a day.   Do not use a donut-shaped pillow or sit on the toilet for long periods. This increases  blood pooling and pain.  SEEK MEDICAL CARE IF:  You have increasing pain and swelling that is not controlled by treatment or medicine.  You have uncontrolled bleeding.  You have difficulty or you are unable to have a bowel movement.  You have pain or inflammation outside the area of the hemorrhoids. MAKE SURE YOU:  Understand these instructions.  Will watch your condition.  Will get help right away if you are not doing well or get worse. Document Released: 02/29/2000 Document Revised: 02/18/2012  Document Reviewed: 01/06/2012 [facility] Patient Information 2015 [place], [place]. This information is not intended to replace advice given to you by your health care provider. Make sure you discuss any questions you have with your health care provider.  Hypokalemia Hypokalemia means that the amount of potassium in the blood is lower than normal.Potassium is a chemical, called an electrolyte, that helps regulate the amount of fluid in the body. It also stimulates muscle contraction and helps nerves function properly.Most of the body's potassium is inside of cells, and only a very small amount is in the blood. Because the amount in the blood is so small, minor changes can be life-threatening. CAUSES  Antibiotics.  Diarrhea or vomiting.  Using laxatives too much, which can cause diarrhea.  Chronic kidney disease.  Water pills (diuretics).  Eating disorders (bulimia).  Low magnesium level.  Sweating a lot. SIGNS AND SYMPTOMS  Weakness.  Constipation.  Fatigue.  Muscle cramps.  Mental confusion.  Skipped heartbeats or irregular heartbeat (palpitations).  Tingling or numbness. DIAGNOSIS  Your health care provider can diagnose hypokalemia with blood tests. In addition to checking your potassium level, your health care provider may also check other lab tests. TREATMENT Hypokalemia can be treated with potassium supplements taken by mouth or adjustments in your current medicines. If your potassium level is very low, you may need to get potassium through a vein (IV) and be monitored in the hospital. A diet high in potassium is also helpful. Foods high in potassium are:  Nuts, such as peanuts and pistachios.  Seeds, such as sunflower seeds and pumpkin seeds.  Peas, lentils, and lima beans.  Whole grain and bran cereals and breads.  Fresh fruit and vegetables, such as apricots, avocado, bananas, cantaloupe, kiwi, oranges, tomatoes, asparagus, and potatoes.  Orange and tomato  juices.  Red meats.  Fruit yogurt. HOME CARE INSTRUCTIONS  Take all medicines as prescribed by your health care provider.  Maintain a healthy diet by including nutritious food, such as fruits, vegetables, nuts, whole grains, and lean meats.  If you are taking a laxative, be sure to follow the directions on the label. SEEK MEDICAL CARE IF:  Your weakness gets worse.  You feel your heart pounding or racing.  You are vomiting or having diarrhea.  You are diabetic and having trouble keeping your blood glucose in the normal range. SEEK IMMEDIATE MEDICAL CARE IF:  You have chest pain, shortness of breath, or dizziness.  You are vomiting or having diarrhea for more than 2 days.  You faint. MAKE SURE YOU:   Understand these instructions.  Will watch your condition.  Will get help right away if you are not doing well or get worse. Document Released: 03/03/2005 Document Revised: 12/22/2012 Document Reviewed: 09/03/2012 [facility] Patient Information 2015 [place], [place]. This information is not intended to replace advice given to you by your health care provider. Make sure you discuss any questions you have with your health care provider.  Nausea and Vomiting Nausea is a  sick feeling that often comes before throwing up (vomiting). Vomiting is a reflex where stomach contents come out of your mouth. Vomiting can cause severe loss of body fluids (dehydration). Children and elderly adults can become dehydrated quickly, especially if they also have diarrhea. Nausea and vomiting are symptoms of a condition or disease. It is important to find the cause of your symptoms. CAUSES   Direct irritation of the stomach lining. This irritation can result from increased acid production (gastroesophageal reflux disease), infection, food poisoning, taking certain medicines (such as nonsteroidal anti-inflammatory drugs), alcohol use, or tobacco use.  Signals from the brain.These signals could be caused  by a headache, heat exposure, an inner ear disturbance, increased pressure in the brain from injury, infection, a tumor, or a concussion, pain, emotional stimulus, or metabolic problems.  An obstruction in the gastrointestinal tract (bowel obstruction).  Illnesses such as diabetes, hepatitis, gallbladder problems, appendicitis, kidney problems, cancer, sepsis, atypical symptoms of a heart attack, or eating disorders.  Medical treatments such as chemotherapy and radiation.  Receiving medicine that makes you sleep (general anesthetic) during surgery. DIAGNOSIS Your caregiver may ask for tests to be done if the problems do not improve after a few days. Tests may also be done if symptoms are severe or if the reason for the nausea and vomiting is not clear. Tests may include:  Urine tests.  Blood tests.  Stool tests.  Cultures (to look for evidence of infection).  X-rays or other imaging studies. Test results can help your caregiver make decisions about treatment or the need for additional tests. TREATMENT You need to stay well hydrated. Drink frequently but in small amounts.You may wish to drink water, sports drinks, clear broth, or eat frozen ice pops or gelatin dessert to help stay hydrated.When you eat, eating slowly may help prevent nausea.There are also some antinausea medicines that may help prevent nausea. HOME CARE INSTRUCTIONS   Take all medicine as directed by your caregiver.  If you do not have an appetite, do not force yourself to eat. However, you must continue to drink fluids.  If you have an appetite, eat a normal diet unless your caregiver tells you differently.  Eat a variety of complex carbohydrates (rice, wheat, potatoes, bread), lean meats, yogurt, fruits, and vegetables.  Avoid high-fat foods because they are more difficult to digest.  Drink enough water and fluids to keep your urine clear or pale yellow.  If you are dehydrated, ask your caregiver for  specific rehydration instructions. Signs of dehydration may include:  Severe thirst.  Dry lips and mouth.  Dizziness.  Dark urine.  Decreasing urine frequency and amount.  Confusion.  Rapid breathing or pulse. SEEK IMMEDIATE MEDICAL CARE IF:   You have blood or brown flecks (like coffee grounds) in your vomit.  You have black or bloody stools.  You have a severe headache or stiff neck.  You are confused.  You have severe abdominal pain.  You have chest pain or trouble breathing.  You do not urinate at least once every 8 hours.  You develop cold or clammy skin.  You continue to vomit for longer than 24 to 48 hours.  You have a fever. MAKE SURE YOU:   Understand these instructions.  Will watch your condition.  Will get help right away if you are not doing well or get worse. Document Released: 03/03/2005 Document Revised: 05/26/2011 Document Reviewed: 07/31/2010 [facility] Patient Information 2015 [place], [place]. This information is not intended to replace advice given to  you by your health care provider. Make sure you discuss any questions you have with your health care provider.

## 2014-06-15 NOTE — ED Provider Notes (Signed)
CSN: [id number]     Arrival date & time 06/14/14  2237 History   First MD Initiated Contact with Patient 06/14/14 2305     Chief Complaint  Patient presents with  . Hemorrhoids  . Influenza     (Consider location/radiation/quality/duration/timing/severity/associated sxs/prior Treatment) HPI  Patient reports on March 27 she didn't feel well with a cough. She reports her daughter had had URI symptoms. The following 3 days she has had vomiting and diarrhea. She reports she is vomiting 5-6 times a day and having diarrhea that she describes as being watery about 10-15 times a day. She reports she's had hemorrhoid problems in the past and they have started hurting again to the point where she's having difficulty sitting. She states yesterday she started noticing bleeding when she wipes and sometimes even see small clots. She denies any abdominal pain. She states she's having hot and cold flashes. She states she feels a little dizzy and lightheaded and today started feeling a little short of breath. However she reports her cough is almost gone. She reports now she is also stuffiness.   PCP [facility] Dr Jens Som  Past Medical History  Diagnosis Date  . Stroke     assoc with short term memory loss and right peripheral vision loss; age 33   . Blood transfusion   . Anxiety   . Coronary artery disease     Apical LAD infarction '00; NSTEMI s/p BMS to prox LAD '09; Cath 12/2011 single vessel CAD w/ patent LAD stent w/ stable mild ISR, nl LV systolic fxn  . Anemia   . Antithrombin III deficiency     ?pt not sure if true diagnosis  . Tobacco abuse   . High blood pressure   . Myocardial infarction    Past Surgical History  Procedure Laterality Date  . Tubal ligation    . Cardiac catheterization    . Esophagogastroduodenoscopy N/A 06/09/2012    Procedure: ESOPHAGOGASTRODUODENOSCOPY (EGD);  Surgeon: Theda Belfast, MD;  Location: [facility] END[facility]COPY;  Service: Endoscopy;  Laterality: N/A;  .  Coronary angioplasty    . Esophagogastroduodenoscopy N/A 08/02/2013    Procedure: ESOPHAGOGASTRODUODENOSCOPY (EGD);  Surgeon: Meryl Dare, MD;  Location: [facility] ENDOSCOPY;  Service: Endoscopy;  Laterality: N/A;  . Left heart catheterization with coronary angiogram N/A 12/24/2011    Procedure: LEFT HEART CATHETERIZATION WITH CORONARY ANGIOGRAM;  Surgeon: Kathleene Hazel, MD;  Location: [facility] CATH LAB;  Service: Cardiovascular;  Laterality: N/A;  . Colonoscopy N/A 03/29/2014    Procedure: COLONOSCOPY;  Surgeon: Louis Meckel, MD;  Location: [facility] ENDOSCOPY;  Service: Endoscopy;  Laterality: N/A;   Family History  Problem Relation Age of Onset  . Heart disease Brother     arrhythmia; died  . Breast cancer Maternal Aunt    History  Substance Use Topics  . Smoking status: Current Every Day Smoker -- 0.20 packs/day for 1 years    Types: Cigarettes  . Smokeless tobacco: Never Used  . Alcohol Use: 0.0 oz/week    0 Cans of beer per week   Smokes 1 pp week Social drinker On disability   OB History    No data available     Review of Systems  All other systems reviewed and are negative.     Allergies  Review of patient's allergies indicates no known allergies.  Home Medications   Prior to Admission medications   Medication Sig Start Date End Date Taking? Authorizing Provider  acetaminophen (TYLENOL) 325 MG tablet Take  2 tablets (650 mg total) by mouth every 6 (six) hours as needed for moderate pain, fever or headache. 02/21/14  Yes Tasrif Ahmed, MD  atorvastatin (LIPITOR) 40 MG tablet Take 40 mg by mouth daily.   Yes Historical Provider, MD  HYDROcodone-acetaminophen (NORCO/VICODIN) 5-325 MG per tablet Take 1 tablet by mouth every 6 (six) hours as needed for moderate pain (pain).   Yes Historical Provider, MD  metoprolol succinate (TOPROL XL) 25 MG 24 hr tablet Take 1 tablet (25 mg total) by mouth daily. 03/03/14  Yes Lewayne Bunting, MD  ondansetron (ZOFRAN ODT) 4 MG  disintegrating tablet Take 1 tablet (4 mg total) by mouth every 8 (eight) hours as needed for nausea or vomiting. 05/11/14  Yes Renne Crigler, PA-C  warfarin (COUMADIN) 7.5 MG tablet Take 1 tablet (7.5 mg total) by mouth one time only at 6 PM. 03/30/14  Yes Courtney Paris, MD  atorvastatin (LIPITOR) 40 MG tablet Take 2 tablets (80 mg total) by mouth daily. Patient not taking: Reported on 06/14/2014 02/21/14 02/21/15  Hyacinth Meeker, MD  cephALEXin (KEFLEX) 500 MG capsule Take 1 capsule (500 mg total) by mouth 4 (four) times daily. Patient not taking: Reported on 06/14/2014 05/11/14   Renne Crigler, PA-C  clindamycin (CLEOCIN) 150 MG capsule Take 2 capsules (300 mg total) by mouth 3 (three) times daily. May dispense as  capsules Patient not taking: Reported on 06/14/2014 05/25/14   Harle Battiest, NP  enoxaparin (LOVENOX) 100 MG/ML injection Inject 0.75 mLs (75 mg total) into the skin daily. Patient not taking: Reported on 06/14/2014 05/25/14   Harle Battiest, NP  hydrocortisone (ANUSOL-HC) 25 MG suppository Place 1 suppository (25 mg total) rectally 2 (two) times daily. Patient not taking: Reported on 05/12/2014 03/30/14   Alexa Dulcy Fanny, MD  nitroGLYCERIN (NITROSTAT) 0.4 MG SL tablet Place 1 tablet (0.4 mg total) under the tongue every 5 (five) minutes as needed for chest pain (up to 3 doses). 10/14/13   Beatrice Lecher, PA-C  ondansetron (ZOFRAN) 4 MG tablet Take 1 tablet (4 mg total) by mouth every 6 (six) hours. Patient not taking: Reported on 06/14/2014 05/25/14   Harle Battiest, NP  oxyCODONE-acetaminophen (ROXICET) 5-325 MG per tablet Take 1-2 tablets by mouth every 4 (four) hours as needed for severe pain. Patient not taking: Reported on 06/14/2014 03/30/14   Courtney Paris, MD  pantoprazole (PROTONIX) 40 MG tablet Take 1 tablet (40 mg total) by mouth daily. Patient not taking: Reported on 05/12/2014 03/30/14   Courtney Paris, MD  predniSONE (DELTASONE) 20 MG tablet Take 3 tablets (60 mg total) by  mouth daily with breakfast. Patient not taking: Reported on 05/12/2014 03/30/14   Courtney Paris, MD   BP 139/88 mmHg  Pulse 126  Temp(Src) 99.1 F (37.3 C) (Oral)  Resp 18  SpO2 99%  Vital signs normal except for tachycardia and low grade temp  Physical Exam  Constitutional: She is oriented to person, place, and time. She appears well-developed and well-nourished.  Non-toxic appearance. She does not appear ill. No distress.  HENT:  Head: Normocephalic and atraumatic.  Right Ear: External ear normal.  Left Ear: External ear normal.  Nose: Nose normal. No mucosal edema or rhinorrhea.  Mouth/Throat: Oropharynx is clear and moist and mucous membranes are normal. No dental abscesses or uvula swelling.  Eyes: Conjunctivae and EOM are normal. Pupils are equal, round, and reactive to light.  Neck: Normal range of motion and full passive range of motion without  pain. Neck supple.  Cardiovascular: Normal rate, regular rhythm and normal heart sounds.  Exam reveals no gallop and no friction rub.   No murmur heard. Pulmonary/Chest: Effort normal and breath sounds normal. No respiratory distress. She has no wheezes. She has no rhonchi. She has no rales. She exhibits no tenderness and no crepitus.  Abdominal: Soft. Normal appearance and bowel sounds are normal. She exhibits no distension. There is no tenderness. There is no rebound and no guarding.  Genitourinary:  Patient has 3 large hemorrhoids. She has 1 at 11:00 and 1 at 1:00 which are tense in appearance. She has another one is from 8:00 to 10:00 that appears to be more excoriated and appears to be the one that is bleeding.  Musculoskeletal: Normal range of motion. She exhibits no edema or tenderness.  Moves all extremities well.   Neurological: She is alert and oriented to person, place, and time. She has normal strength. No cranial nerve deficit.  Skin: Skin is warm, dry and intact. No rash noted. No erythema. No pallor.  Psychiatric: She has a  normal mood and affect. Her speech is normal and behavior is normal. Her mood appears not anxious.  Nursing note and vitals reviewed.   ED Course  Procedures (including critical care time)  Medications  0.9 %  sodium chloride infusion (0 mLs Intravenous Stopped 06/15/14 0447)    Followed by  0.9 %  sodium chloride infusion (0 mLs Intravenous Stopped 06/15/14 0447)    Followed by  0.9 %  sodium chloride infusion (not administered)  fentaNYL (SUBLIMAZE) injection 50 mcg (50 mcg Intravenous Given 06/15/14 0042)  ondansetron (ZOFRAN) injection 4 mg (4 mg Intravenous Given 06/15/14 0042)  potassium chloride SA (K-DUR,KLOR-CON) CR tablet 40 mEq (40 mEq Oral Given 06/15/14 0255)  HYDROcodone-acetaminophen (NORCO/VICODIN) 5-325 MG per tablet 2 tablet (2 tablets Oral Given 06/15/14 0255)   Patient was given 2 L of normal saline bolus with fentanyl and Zofran. We discussed calling [facility] to have her hemorrhoids rechecked today.   Pt given oral potassium for her hypokalemia. She had been tolerating oral fluids.  Labs Review Results for orders placed or performed during the hospital encounter of 06/14/14  Comprehensive metabolic panel  Result Value Ref Range   Sodium 137 135 - 145 mmol/L   Potassium 3.1 (L) 3.5 - 5.1 mmol/L   Chloride 100 96 - 112 mmol/L   CO2 26 19 - 32 mmol/L   Glucose, Bld 100 (H) 70 - 99 mg/dL   BUN <5 (L) 6 - 23 mg/dL   Creatinine, Ser 1.61 0.50 - 1.10 mg/dL   Calcium 8.5 8.4 - 09.6 mg/dL   Total Protein 7.5 6.0 - 8.3 g/dL   Albumin 3.7 3.5 - 5.2 g/dL   AST 37 0 - 37 U/L   ALT 21 0 - 35 U/L   Alkaline Phosphatase 100 39 - 117 U/L   Total Bilirubin 0.6 0.3 - 1.2 mg/dL   GFR calc non Af Amer >90 >90 mL/min   GFR calc Af Amer >90 >90 mL/min   Anion gap 11 5 - 15  CBC with Differential  Result Value Ref Range   WBC 4.4 4.0 - 10.5 K/uL   RBC 3.62 (L) 3.87 - 5.11 MIL/uL   Hemoglobin 10.9 (L) 12.0 - 15.0 g/dL   HCT 04.5 (L) 40.9 - 81.1 %   MCV 94.5 78.0  - 100.0 fL   MCH 30.1 26.0 - 34.0 pg   MCHC 31.9 30.0 -  36.0 g/dL   RDW 92.3 (H) 30.0 - 76.2 %   Platelets 149 (L) 150 - 400 K/uL   Neutrophils Relative % 52 43 - 77 %   Neutro Abs 2.3 1.7 - 7.7 K/uL   Lymphocytes Relative 37 12 - 46 %   Lymphs Abs 1.6 0.7 - 4.0 K/uL   Monocytes Relative 9 3 - 12 %   Monocytes Absolute 0.4 0.1 - 1.0 K/uL   Eosinophils Relative 1 0 - 5 %   Eosinophils Absolute 0.0 0.0 - 0.7 K/uL   Basophils Relative 1 0 - 1 %   Basophils Absolute 0.1 0.0 - 0.1 K/uL  Protime-INR  Result Value Ref Range   Prothrombin Time 12.9 11.6 - 15.2 seconds   INR 0.96 0.00 - 1.49  Sample to Blood Bank  Result Value Ref Range   Blood Bank Specimen SAMPLE AVAILABLE FOR TESTING    Sample Expiration 06/18/2014    Laboratory interpretation all normal except hypokalemia, mild anemia     Imaging Review No results found.   EKG Interpretation None      MDM   Final diagnoses:  Nausea vomiting and diarrhea  Dehydration  Hypokalemia  Hemorrhoids, unspecified hemorrhoid type   New Prescriptions   HYDROCODONE-ACETAMINOPHEN (NORCO/VICODIN) 5-325 MG PER TABLET    Take 1 tablet by mouth every 6 (six) hours as needed for moderate pain.   HYDROCORTISONE-PRAMOXINE (PROCTOFOAM-HC) RECTAL FOAM    Place 1 applicator rectally 2 (two) times daily.   ONDANSETRON (ZOFRAN) 4 MG TABLET    Take 1 tablet (4 mg total) by mouth every 8 (eight) hours as needed for nausea or vomiting.   POTASSIUM CHLORIDE SA (K-DUR,KLOR-CON) 20 MEQ TABLET    Take 1 tablet (20 mEq total) by mouth 2 (two) times daily.     Plan discharge  Devoria Albe, MD, Concha Pyo, MD 06/15/14 [phone]

## 2014-06-15 NOTE — ED Notes (Signed)
Pt left ama 

## 2014-06-15 NOTE — ED Notes (Signed)
Pt told phlebotomy that she was leaving and going to [place].

## 2014-06-15 NOTE — ED Notes (Signed)
Pt presents with c/o chest pain that started this morning. Pt was discharged this morning for something other than the chest pain. Pt has a hx of MI's in the past. Pt reports some dizziness and shortness of breath with the pain as well.

## 2014-06-16 ENCOUNTER — Ambulatory Visit (INDEPENDENT_AMBULATORY_CARE_PROVIDER_SITE_OTHER): Payer: Medicaid Other | Admitting: Internal Medicine

## 2014-06-16 ENCOUNTER — Telehealth: Payer: Self-pay | Admitting: *Deleted

## 2014-06-16 VITALS — BP 122/88 | HR 85 | Temp 98.3°F | Wt 170.7 lb

## 2014-06-16 DIAGNOSIS — K645 Perianal venous thrombosis: Secondary | ICD-10-CM

## 2014-06-16 DIAGNOSIS — K649 Unspecified hemorrhoids: Secondary | ICD-10-CM | POA: Insufficient documentation

## 2014-06-16 NOTE — ED Notes (Signed)
This Pt's chart was accessed by this Charge RN, due to the Pt calling with a concern.  Pt reports that provider "left thrombus off her notes," so [facility] will not do emergency surgery.  [place] AD contacted [facility] which reported that the Pt has an appointment 4/7 and discussed the Pt's status/condition w/ currently EDPs.  EDPs report that Pt's condition is not emergent and she will be fine to wait until the 7th.  This Consulting civil engineer called and informed the Pt of this information.  Pt reports "I won't make it until 7th."  This Charge RN advised the Pt that if she feels the need to be seen, then we will be happy to see her.  Pt verbalized understanding.

## 2014-06-16 NOTE — Telephone Encounter (Signed)
Pt presented prescriptions written 3/10.[facility] asked if ok to fill.

## 2014-06-16 NOTE — Progress Notes (Addendum)
   Subjective:    Patient ID: Lindsey [name], female    DOB: 1970/09/19, 44 y.o.   MRN: [id number]  HPI Lindsey [name] is a 44yo woman with PMHx of rectal bleeding due to hemorrhoids who presents today for evaluation of rectal pain and rectal bleeding. She states last Monday her daughter was sick with a viral infection and that she started to have nausea/vomiting/diarrhea a few days later. She reports she has not been able to keep anything down and has not had a solid bowel movement for 1 week. She reports bright red blood per rectum since Wednesday. She denies any dark stool. She reports a history of hemorrhoids that she was able to push back in, but now she is unable to. She reports extreme pain to the point that she has had to go to the ED several times this week. She has tried sitz baths and tylenol with no relief. She was given Vicodin in the ED and reports it makes her feel even more nauseous so she has not been taking it.    Review of Systems General: Denies fever, chills, night sweats, changes in weight, changes in appetite HEENT: Denies headaches, ear pain, changes in vision, rhinorrhea, sore throat CV: Denies CP, palpitations, SOB, orthopnea Pulm: Denies SOB, cough, wheezing GI: See above GU: Denies dysuria, hematuria, frequency Msk: Denies muscle cramps, joint pains Neuro: Denies weakness, numbness, tingling Skin: Denies rashes, bruising    Objective:   Physical Exam General: laying on exam table, anxious  HEENT: [place]/AT, EOMI, sclera anicteric, mucus membranes moist CV: RRR, no m/g/r Pulm: CTA bilaterally, breaths non-labored  Abd: BS+, soft, non-tender, non-distended Rectal: There are 3 large hemorrhoids present, one at 11:00, one at 1:00, and one at 8:00. The ones at 11:00 and 1:00 appear to be thrombosed. The one at 8:00 appears excoriated. All are extremely tender to palpation. Patient refused FOBT due to pain. Unable to push hemorrhoids back in.  Ext: warm, no edema, moves  all Neuro: alert and oriented x 3, no focal deficits      Assessment & Plan:

## 2014-06-16 NOTE — Patient Instructions (Signed)
Go to [facility] to be assessed by surgeon at 3:45 PM. Please bring your insurance card and other personal information.   General Instructions:   Please bring your medicines with you each time you come to clinic.  Medicines may include prescription medications, over-the-counter medications, herbal remedies, eye drops, vitamins, or other pills.   Progress Toward Treatment Goals:  No flowsheet data found.  Self Care Goals & Plans:  Self Care Goal 08/15/2013  Manage my medications take my medicines as prescribed; bring my medications to every visit; refill my medications on time  Eat healthy foods drink diet soda or water instead of juice or soda; eat more vegetables; eat foods that are low in salt; eat baked foods instead of fried foods; eat fruit for snacks and desserts    No flowsheet data found.   Care Management & Community Referrals:  No flowsheet data found.

## 2014-06-16 NOTE — Assessment & Plan Note (Addendum)
Patient presents with extreme rectal pain and rectal bleeding for the past 1 week. On exam she has 3 large external hemorrhoids that appear thrombosed as they are exquisitely tender, tense, and unable to be reduced. Spoke with [facility] and will see patient today. Rectal bleeding most likely from hemorrhoids as patient had colonoscopy in January this year and was negative for malignancy.  - Recommended to continue sitz baths as needed and ibuprofen for pain

## 2014-06-19 NOTE — Progress Notes (Signed)
Internal Medicine Clinic Attending  Case discussed with Dr. Rivet at the time of the visit.  We reviewed the resident's history and exam and pertinent patient test results.  I agree with the assessment, diagnosis, and plan of care documented in the resident's note.  

## 2014-09-10 ENCOUNTER — Emergency Department (HOSPITAL_COMMUNITY)
Admission: EM | Admit: 2014-09-10 | Discharge: 2014-09-11 | Disposition: A | Discharge status: Home / Self Care | Payer: Medicaid Other | Attending: Emergency Medicine | Admitting: Emergency Medicine

## 2014-09-10 ENCOUNTER — Emergency Department (HOSPITAL_COMMUNITY): Payer: Medicaid Other

## 2014-09-10 ENCOUNTER — Encounter (HOSPITAL_COMMUNITY): Payer: Self-pay | Admitting: Emergency Medicine

## 2014-09-10 DIAGNOSIS — I2510 Atherosclerotic heart disease of native coronary artery without angina pectoris: Secondary | ICD-10-CM | POA: Diagnosis not present

## 2014-09-10 DIAGNOSIS — Z79899 Other long term (current) drug therapy: Secondary | ICD-10-CM | POA: Diagnosis not present

## 2014-09-10 DIAGNOSIS — Z9861 Coronary angioplasty status: Secondary | ICD-10-CM | POA: Insufficient documentation

## 2014-09-10 DIAGNOSIS — R1010 Upper abdominal pain, unspecified: Secondary | ICD-10-CM

## 2014-09-10 DIAGNOSIS — R634 Abnormal weight loss: Secondary | ICD-10-CM | POA: Insufficient documentation

## 2014-09-10 DIAGNOSIS — Z8673 Personal history of transient ischemic attack (TIA), and cerebral infarction without residual deficits: Secondary | ICD-10-CM | POA: Insufficient documentation

## 2014-09-10 DIAGNOSIS — Z862 Personal history of diseases of the blood and blood-forming organs and certain disorders involving the immune mechanism: Secondary | ICD-10-CM | POA: Insufficient documentation

## 2014-09-10 DIAGNOSIS — Z7901 Long term (current) use of anticoagulants: Secondary | ICD-10-CM | POA: Diagnosis not present

## 2014-09-10 DIAGNOSIS — Z7952 Long term (current) use of systemic steroids: Secondary | ICD-10-CM | POA: Diagnosis not present

## 2014-09-10 DIAGNOSIS — R112 Nausea with vomiting, unspecified: Secondary | ICD-10-CM | POA: Insufficient documentation

## 2014-09-10 DIAGNOSIS — Z792 Long term (current) use of antibiotics: Secondary | ICD-10-CM | POA: Diagnosis not present

## 2014-09-10 DIAGNOSIS — R197 Diarrhea, unspecified: Secondary | ICD-10-CM | POA: Insufficient documentation

## 2014-09-10 DIAGNOSIS — Z8659 Personal history of other mental and behavioral disorders: Secondary | ICD-10-CM | POA: Diagnosis not present

## 2014-09-10 DIAGNOSIS — E669 Obesity, unspecified: Secondary | ICD-10-CM | POA: Insufficient documentation

## 2014-09-10 DIAGNOSIS — Z8719 Personal history of other diseases of the digestive system: Secondary | ICD-10-CM | POA: Insufficient documentation

## 2014-09-10 DIAGNOSIS — Z720 Tobacco use: Secondary | ICD-10-CM | POA: Diagnosis not present

## 2014-09-10 DIAGNOSIS — N390 Urinary tract infection, site not specified: Secondary | ICD-10-CM | POA: Diagnosis not present

## 2014-09-10 DIAGNOSIS — Z9889 Other specified postprocedural states: Secondary | ICD-10-CM | POA: Diagnosis not present

## 2014-09-10 DIAGNOSIS — R1013 Epigastric pain: Secondary | ICD-10-CM | POA: Diagnosis present

## 2014-09-10 DIAGNOSIS — I252 Old myocardial infarction: Secondary | ICD-10-CM | POA: Insufficient documentation

## 2014-09-10 LAB — CBC WITH DIFFERENTIAL/PLATELET
Basophils Absolute: 0 10*3/uL (ref 0.0–0.1)
Basophils Relative: 1 % (ref 0–1)
EOS ABS: 0.1 10*3/uL (ref 0.0–0.7)
EOS PCT: 1 % (ref 0–5)
HCT: 34.2 % — ABNORMAL LOW (ref 36.0–46.0)
Hemoglobin: 11.1 g/dL — ABNORMAL LOW (ref 12.0–15.0)
LYMPHS ABS: 1.1 10*3/uL (ref 0.7–4.0)
LYMPHS PCT: 22 % (ref 12–46)
MCH: 30.4 pg (ref 26.0–34.0)
MCHC: 32.5 g/dL (ref 30.0–36.0)
MCV: 93.7 fL (ref 78.0–100.0)
Monocytes Absolute: 0.4 10*3/uL (ref 0.1–1.0)
Monocytes Relative: 7 % (ref 3–12)
NEUTROS ABS: 3.5 10*3/uL (ref 1.7–7.7)
Neutrophils Relative %: 69 % (ref 43–77)
Platelets: 188 10*3/uL (ref 150–400)
RBC: 3.65 MIL/uL — ABNORMAL LOW (ref 3.87–5.11)
RDW: 17.6 % — ABNORMAL HIGH (ref 11.5–15.5)
WBC: 5.1 10*3/uL (ref 4.0–10.5)

## 2014-09-10 LAB — COMPREHENSIVE METABOLIC PANEL
ALBUMIN: 3.9 g/dL (ref 3.5–5.0)
ALK PHOS: 158 U/L — AB (ref 38–126)
ALT: 55 U/L — ABNORMAL HIGH (ref 14–54)
ANION GAP: 12 (ref 5–15)
AST: 99 U/L — ABNORMAL HIGH (ref 15–41)
BUN: <5 mg/dL — ABNORMAL LOW (ref 6–20)
CHLORIDE: 101 mmol/L (ref 101–111)
CO2: 24 mmol/L (ref 22–32)
Calcium: 8.4 mg/dL — ABNORMAL LOW (ref 8.9–10.3)
Creatinine, Ser: 0.74 mg/dL (ref 0.44–1.00)
GFR calc Af Amer: >60 mL/min (ref >60)
Glucose, Bld: 119 mg/dL — ABNORMAL HIGH (ref 65–99)
Potassium: 3 mmol/L — ABNORMAL LOW (ref 3.5–5.1)
Sodium: 137 mmol/L (ref 135–145)
TOTAL PROTEIN: 7.5 g/dL (ref 6.5–8.1)
Total Bilirubin: 1.6 mg/dL — ABNORMAL HIGH (ref 0.3–1.2)

## 2014-09-10 LAB — URINALYSIS, ROUTINE W REFLEX MICROSCOPIC
Glucose, UA: NEGATIVE mg/dL
KETONES UR: 15 mg/dL — AB
Nitrite: POSITIVE — AB
Protein, ur: 100 mg/dL — AB
Specific Gravity, Urine: 1.034 — ABNORMAL HIGH (ref 1.005–1.030)
UROBILINOGEN UA: 1 mg/dL (ref 0.0–1.0)
pH: 5 (ref 5.0–8.0)

## 2014-09-10 LAB — I-STAT TROPONIN, ED: Troponin i, poc: 0 ng/mL (ref 0.00–0.08)

## 2014-09-10 LAB — LIPASE, BLOOD: Lipase: 17 U/L — ABNORMAL LOW (ref 22–51)

## 2014-09-10 LAB — URINE MICROSCOPIC-ADD ON

## 2014-09-10 MED ORDER — MORPHINE SULFATE 4 MG/ML IJ SOLN
4.0000 mg | Freq: Once | INTRAMUSCULAR | Status: AC
Start: 2014-09-10 — End: 2014-09-10
  Administered 2014-09-10: 4 mg via INTRAVENOUS
  Filled 2014-09-10: qty 1

## 2014-09-10 MED ORDER — ONDANSETRON 4 MG PO TBDP: 4.0000 mg | ORAL_TABLET | Freq: Three times a day (TID) | ORAL | Status: DC | PRN

## 2014-09-10 MED ORDER — IOHEXOL 300 MG/ML  SOLN
80.0000 mL | Freq: Once | INTRAMUSCULAR | Status: AC | PRN
  Administered 2014-09-10: 100 mL via INTRAVENOUS

## 2014-09-10 MED ORDER — CEPHALEXIN 500 MG PO CAPS: 500.0000 mg | ORAL_CAPSULE | Freq: Four times a day (QID) | ORAL | Status: DC

## 2014-09-10 MED ORDER — HYDROCODONE-ACETAMINOPHEN 5-325 MG PO TABS: 2.0000 | ORAL_TABLET | ORAL | Status: DC | PRN

## 2014-09-10 MED ORDER — SODIUM CHLORIDE 0.9 % IV BOLUS (SEPSIS)
1000.0000 mL | Freq: Once | INTRAVENOUS | Status: AC
  Administered 2014-09-10: 1000 mL via INTRAVENOUS

## 2014-09-10 MED ORDER — CEFTRIAXONE SODIUM 1 G IJ SOLR
1.0000 g | Freq: Once | INTRAMUSCULAR | Status: AC
  Administered 2014-09-10: 1 g via INTRAVENOUS
  Filled 2014-09-10: qty 10

## 2014-09-10 MED ORDER — ONDANSETRON HCL 4 MG/2ML IJ SOLN
4.0000 mg | Freq: Once | INTRAMUSCULAR | Status: AC
  Administered 2014-09-10: 4 mg via INTRAVENOUS
  Filled 2014-09-10: qty 2

## 2014-09-10 NOTE — Discharge Instructions (Signed)
Take keflex as directed until gone. Take Vicodin as needed for pain. Take zofran as needed for nausea. Refer to attached documents for more information.

## 2014-09-10 NOTE — ED Provider Notes (Signed)
CSN: [id number]     Arrival date & time 09/10/14  1914 History   First MD Initiated Contact with Patient 09/10/14 2007     Chief Complaint  Patient presents with  . Abdominal Pain     (Consider location/radiation/quality/duration/timing/severity/associated sxs/prior Treatment) HPI Comments: Patient is a 44 year old female with a past medical history of hypertension, previous MI, previous TIA, antithrombin III deficiency, obesity, and pancreatitis who presents with abdominal pain for the past 6 days. The pain is located in the epigastrium and radiates to her back. The pain is described as aching and severe. The pain started gradually and progressively worsened since the onset. No alleviating/aggravating factors. The patient has tried nothing for symptoms without relief. Associated symptoms include NVD, generalized weakness, and 30lb weight loss in 3 weeks. Patient denies fever, headache, chest pain, SOB, dysuria, constipation, abnormal vaginal bleeding/discharge.     Patient is a 44 y.o. female presenting with abdominal pain.  Abdominal Pain Associated symptoms: diarrhea, nausea and vomiting   Associated symptoms: no chest pain, no chills, no dysuria, no fatigue, no fever and no shortness of breath     Past Medical History  Diagnosis Date  . Stroke     assoc with short term memory loss and right peripheral vision loss; age 61   . Blood transfusion   . Anxiety   . Coronary artery disease     Apical LAD infarction '00; NSTEMI s/p BMS to prox LAD '09; Cath 12/2011 single vessel CAD w/ patent LAD stent w/ stable mild ISR, nl LV systolic fxn  . Anemia   . Antithrombin III deficiency     ?pt not sure if true diagnosis  . Tobacco abuse   . High blood pressure   . Myocardial infarction    Past Surgical History  Procedure Laterality Date  . Tubal ligation    . Cardiac catheterization    . Esophagogastroduodenoscopy N/A 06/09/2012    Procedure: ESOPHAGOGASTRODUODENOSCOPY (EGD);  Surgeon:  Theda Belfast, MD;  Location: [facility];  Service: Endoscopy;  Laterality: N/A;  . Coronary angioplasty    . Esophagogastroduodenoscopy N/A 08/02/2013    Procedure: ESOPHAGOGASTRODUODENOSCOPY (EGD);  Surgeon: Meryl Dare, MD;  Location: [facility] ENDOSCOPY;  Service: Endoscopy;  Laterality: N/A;  . Left heart catheterization with coronary angiogram N/A 12/24/2011    Procedure: LEFT HEART CATHETERIZATION WITH CORONARY ANGIOGRAM;  Surgeon: Kathleene Hazel, MD;  Location: [facility] CATH LAB;  Service: Cardiovascular;  Laterality: N/A;  . Colonoscopy N/A 03/29/2014    Procedure: COLONOSCOPY;  Surgeon: Louis Meckel, MD;  Location: [facility] ENDOSCOPY;  Service: Endoscopy;  Laterality: N/A;   Family History  Problem Relation Age of Onset  . Heart disease Brother     arrhythmia; died  . Breast cancer Maternal Aunt    History  Substance Use Topics  . Smoking status: Current Every Day Smoker -- 0.20 packs/day for 1 years    Types: Cigarettes  . Smokeless tobacco: Never Used  . Alcohol Use: 0.0 oz/week    0 Cans of beer per week   OB History    No data available     Review of Systems  Constitutional: Positive for unexpected weight change. Negative for fever, chills and fatigue.  HENT: Negative for trouble swallowing.   Eyes: Negative for visual disturbance.  Respiratory: Negative for shortness of breath.   Cardiovascular: Negative for chest pain and palpitations.  Gastrointestinal: Positive for nausea, vomiting, abdominal pain and diarrhea.  Genitourinary: Negative for dysuria and difficulty  urinating.  Musculoskeletal: Negative for arthralgias and neck pain.  Skin: Negative for color change.  Neurological: Negative for dizziness and weakness.  Psychiatric/Behavioral: Negative for dysphoric mood.      Allergies  Review of patient's allergies indicates no known allergies.  Home Medications   Prior to Admission medications   Medication Sig Start Date End Date Taking? Authorizing  Provider  acetaminophen (TYLENOL) 325 MG tablet Take 2 tablets (650 mg total) by mouth every 6 (six) hours as needed for moderate pain, fever or headache. 02/21/14   Tasrif Ahmed, MD  atorvastatin (LIPITOR) 40 MG tablet Take 2 tablets (80 mg total) by mouth daily. 02/21/14 02/21/15  Tasrif Ahmed, MD  cephALEXin (KEFLEX) 500 MG capsule Take 1 capsule (500 mg total) by mouth 4 (four) times daily. Patient not taking: Reported on 06/14/2014 05/11/14   Renne Crigler, PA-C  clindamycin (CLEOCIN) 150 MG capsule Take 2 capsules (300 mg total) by mouth 3 (three) times daily. May dispense as 150mg  capsules Patient not taking: Reported on 06/14/2014 05/25/14   Harle Battiest, NP  enoxaparin (LOVENOX) 100 MG/ML injection Inject 0.75 mLs (75 mg total) into the skin daily. Patient not taking: Reported on 06/14/2014 05/25/14   Harle Battiest, NP  HYDROcodone-acetaminophen (NORCO/VICODIN) 5-325 MG per tablet Take 1 tablet by mouth every 6 (six) hours as needed for moderate pain. Patient not taking: Reported on 06/16/2014 06/15/14   Devoria Albe, MD  hydrocortisone (ANUSOL-HC) 25 MG suppository Place 1 suppository (25 mg total) rectally 2 (two) times daily. Patient not taking: Reported on 05/12/2014 03/30/14   Alexa Dulcy Fanny, MD  hydrocortisone-pramoxine [facility]) rectal foam Place 1 applicator rectally 2 (two) times daily. 06/15/14   Devoria Albe, MD  metoprolol succinate (TOPROL XL) 25 MG 24 hr tablet Take 1 tablet (25 mg total) by mouth daily. 03/03/14   Lewayne Bunting, MD  nitroGLYCERIN (NITROSTAT) 0.4 MG SL tablet Place 1 tablet (0.4 mg total) under the tongue every 5 (five) minutes as needed for chest pain (up to 3 doses). Patient not taking: Reported on 06/16/2014 10/14/13   Beatrice Lecher, PA-C  ondansetron (ZOFRAN ODT) 4 MG disintegrating tablet Take 1 tablet (4 mg total) by mouth every 8 (eight) hours as needed for nausea or vomiting. 05/11/14   Renne Crigler, PA-C  oxyCODONE-acetaminophen (ROXICET) 5-325 MG  per tablet Take 1-2 tablets by mouth every 4 (four) hours as needed for severe pain. Patient not taking: Reported on 06/14/2014 03/30/14   Courtney Paris, MD  pantoprazole (PROTONIX) 40 MG tablet Take 1 tablet (40 mg total) by mouth daily. 03/30/14   Courtney Paris, MD  potassium chloride SA (K-DUR,KLOR-CON) 20 MEQ tablet Take 1 tablet (20 mEq total) by mouth 2 (two) times daily. 06/15/14   Devoria Albe, MD  warfarin (COUMADIN) 7.5 MG tablet Take 1 tablet (7.5 mg total) by mouth one time only at 6 PM. 03/30/14   Courtney Paris, MD   BP 116/94 mmHg  Pulse 105  Temp(Src) 98.6 F (37 C) (Oral)  Resp 17  SpO2 100% Physical Exam  Constitutional: She is oriented to person, place, and time. She appears well-developed and well-nourished. No distress.  HENT:  Head: Normocephalic and atraumatic.  Eyes: Conjunctivae are normal.  Neck: Normal range of motion.  Cardiovascular: Normal rate and regular rhythm.  Exam reveals no gallop and no friction rub.   No murmur heard. Pulmonary/Chest: Effort normal and breath sounds normal. She has no wheezes. She has no rales. She exhibits no tenderness.  Abdominal: Soft. She exhibits no distension. There is tenderness. There is no rebound.  Epigastric tenderness to palpation. No other focal tenderness to palpation or peritoneal signs.   Musculoskeletal: Normal range of motion.  Neurological: She is alert and oriented to person, place, and time. Coordination normal.  Speech is goal-oriented. Moves limbs without ataxia.   Skin: Skin is warm and dry.  Psychiatric: She has a normal mood and affect. Her behavior is normal.  Nursing note and vitals reviewed.   ED Course  Procedures (including critical care time) Labs Review Labs Reviewed  CBC WITH DIFFERENTIAL/PLATELET - Abnormal; Notable for the following:    RBC 3.65 (*)    Hemoglobin 11.1 (*)    HCT 34.2 (*)    RDW 17.6 (*)    All other components within normal limits  COMPREHENSIVE METABOLIC PANEL - Abnormal;  Notable for the following:    Potassium 3.0 (*)    Glucose, Bld 119 (*)    BUN <5 (*)    Calcium 8.4 (*)    AST 99 (*)    ALT 55 (*)    Alkaline Phosphatase 158 (*)    Total Bilirubin 1.6 (*)    All other components within normal limits  LIPASE, BLOOD - Abnormal; Notable for the following:    Lipase 17 (*)    All other components within normal limits  URINALYSIS, ROUTINE W REFLEX MICROSCOPIC (NOT AT [facility]) - Abnormal; Notable for the following:    Color, Urine ORANGE (*)    APPearance TURBID (*)    Specific Gravity, Urine 1.034 (*)    Hgb urine dipstick TRACE (*)    Bilirubin Urine SMALL (*)    Ketones, ur 15 (*)    Protein, ur 100 (*)    Nitrite POSITIVE (*)    Leukocytes, UA SMALL (*)    All other components within normal limits  URINE MICROSCOPIC-ADD ON - Abnormal; Notable for the following:    Squamous Epithelial / LPF MANY (*)    Bacteria, UA MANY (*)    Casts HYALINE CASTS (*)    All other components within normal limits  I-STAT TROPOININ, ED    Imaging Review Ct Abdomen Pelvis W Contrast  09/10/2014   CLINICAL DATA:  Epigastric pain radiating to the back. Nausea, vomiting and diarrhea for 4 days. Weight loss.  EXAM: CT ABDOMEN AND PELVIS WITH CONTRAST  TECHNIQUE: Multidetector CT imaging of the abdomen and pelvis was performed using the standard protocol following bolus administration of intravenous contrast.  CONTRAST:  OMNIPAQUE IOHEXOL 300 MG/ML  SOLN  COMPARISON:  CT 10/30/2013, 08/01/2013  FINDINGS: The included lung bases are clear. There is hypodensity at the apex of the left ventricle that appears similar to prior exams.  Diffusely decreased hepatic density consistent with steatosis. No focal hepatic lesion. The gallbladder is physiologically distended. There is minimal fatty sparing adjacent to the gallbladder fossa. The spleen, adrenal glands, and pancreas are normal. No peripancreatic inflammatory change or CT findings of acute pancreatitis. Kidneys  demonstrate symmetric enhancement and excretion without hydronephrosis or localizing abnormality.  The stomach is physiologically distended. There are no dilated or thickened bowel loops. The appendix is normal. There is diverticulosis of the distal colon without diverticulitis. Small volume of colonic stool. No free air, free fluid, or intra-abdominal fluid collection.  No retroperitoneal adenopathy. Abdominal aorta is normal in caliber. Small fat containing umbilical hernia.  Within the pelvis the urinary bladder is physiologically distended. The uterus and adnexa are normal for  age. Bilateral tubal ligation clips are seen. There is no pelvic free fluid. No pelvic adenopathy.  There are no acute or suspicious osseous abnormalities.  IMPRESSION: 1. No acute abnormality in the abdomen/pelvis. 2. Chronic findings include diverticulosis without diverticulitis, hepatic steatosis, and fat containing umbilical hernia. 3. Questionable filling defect in the left ventricular apex, this is seen on prior CT of May 2015, may reflect thrombus. If not already performed, echocardiography could be considered as mentioned previously.   Electronically Signed   By: Rubye Oaks M.D.   On: 09/10/2014 22:39     EKG Interpretation None      MDM   Final diagnoses:  UTI (lower urinary tract infection)  Pain of upper abdomen  Nausea and vomiting in adult patient  Diarrhea   8:49 PM Labs and urinalysis pending. Patient is tachycardic with remaining vitals stable. Patient will have morphine and zofran for symptoms. CT abdomen pelvis pending.   11:51 PM Patient's labs unremarkable for acute changes. Urinalysis shows UTI. Patient feeling better and is able to tolerate fluids and apple sauce. Patient will be discharged with keflex, vicodin and zofran.     Emilia Beck, PA-C 09/10/14 2352  Elwin Mocha, MD 09/11/14 [phone]

## 2014-09-10 NOTE — ED Notes (Signed)
Patient transported to CT 

## 2014-09-10 NOTE — ED Notes (Signed)
C/o mid upper abd pain that radiates to back since Monday with generalized weakness, nausea, vomiting, diarrhea, and diaphoresis.

## 2014-09-26 ENCOUNTER — Ambulatory Visit (INDEPENDENT_AMBULATORY_CARE_PROVIDER_SITE_OTHER): Payer: Medicaid Other | Admitting: Pharmacist

## 2014-09-26 ENCOUNTER — Encounter: Payer: Self-pay | Admitting: Internal Medicine

## 2014-09-26 ENCOUNTER — Ambulatory Visit (INDEPENDENT_AMBULATORY_CARE_PROVIDER_SITE_OTHER): Payer: Medicaid Other | Admitting: Internal Medicine

## 2014-09-26 VITALS — BP 137/91 | HR 96 | Temp 98.7°F | Ht 66.0 in | Wt 167.6 lb

## 2014-09-26 DIAGNOSIS — Z7901 Long term (current) use of anticoagulants: Secondary | ICD-10-CM

## 2014-09-26 DIAGNOSIS — R197 Diarrhea, unspecified: Secondary | ICD-10-CM | POA: Diagnosis not present

## 2014-09-26 DIAGNOSIS — I2109 ST elevation (STEMI) myocardial infarction involving other coronary artery of anterior wall: Secondary | ICD-10-CM

## 2014-09-26 DIAGNOSIS — R634 Abnormal weight loss: Secondary | ICD-10-CM | POA: Diagnosis not present

## 2014-09-26 DIAGNOSIS — N912 Amenorrhea, unspecified: Secondary | ICD-10-CM | POA: Diagnosis not present

## 2014-09-26 DIAGNOSIS — R1310 Dysphagia, unspecified: Secondary | ICD-10-CM

## 2014-09-26 DIAGNOSIS — I513 Intracardiac thrombosis, not elsewhere classified: Secondary | ICD-10-CM

## 2014-09-26 DIAGNOSIS — R198 Other specified symptoms and signs involving the digestive system and abdomen: Secondary | ICD-10-CM | POA: Insufficient documentation

## 2014-09-26 LAB — CBC
HEMATOCRIT: 32.8 % — AB (ref 36.0–46.0)
HEMOGLOBIN: 10.4 g/dL — AB (ref 12.0–15.0)
MCH: 30 pg (ref 26.0–34.0)
MCHC: 31.7 g/dL (ref 30.0–36.0)
MCV: 94.5 fL (ref 78.0–100.0)
MPV: 9.6 fL (ref 8.6–12.4)
Platelets: 266 10*3/uL (ref 150–400)
RBC: 3.47 MIL/uL — ABNORMAL LOW (ref 3.87–5.11)
RDW: 18.4 % — ABNORMAL HIGH (ref 11.5–15.5)
WBC: 7.2 10*3/uL (ref 4.0–10.5)

## 2014-09-26 LAB — COMPREHENSIVE METABOLIC PANEL
ALBUMIN: 3.9 g/dL (ref 3.5–5.2)
ALT: 50 U/L — ABNORMAL HIGH (ref 0–35)
AST: 90 U/L — ABNORMAL HIGH (ref 0–37)
Alkaline Phosphatase: 133 U/L — ABNORMAL HIGH (ref 39–117)
BUN: 5 mg/dL — ABNORMAL LOW (ref 6–23)
CALCIUM: 8.9 mg/dL (ref 8.4–10.5)
CO2: 26 mEq/L (ref 19–32)
CREATININE: 0.52 mg/dL (ref 0.50–1.10)
Chloride: 107 mEq/L (ref 96–112)
Glucose, Bld: 89 mg/dL (ref 70–99)
POTASSIUM: 3.6 meq/L (ref 3.5–5.3)
Sodium: 145 mEq/L (ref 135–145)
Total Bilirubin: 0.3 mg/dL (ref 0.2–1.2)
Total Protein: 6.7 g/dL (ref 6.0–8.3)

## 2014-09-26 LAB — POCT URINE PREGNANCY: PREG TEST UR: NEGATIVE

## 2014-09-26 LAB — TSH: TSH: 0.438 u[IU]/mL (ref 0.350–4.500)

## 2014-09-26 NOTE — Patient Instructions (Signed)
Patient educated about medication as defined in this encounter and verbalized understanding by repeating back instructions provided.   

## 2014-09-26 NOTE — Progress Notes (Signed)
CLINICAL PHARMACIST ANTICOAG NOTE Lindsey [name] is a 44 y.o. female who reports to the clinic for monitoring of warfarin treatment.    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  Lab Results  Component Value Date   INR 0.96 06/15/2014   INR 1.04 05/25/2014   INR 1.09 05/11/2014   Anticoagulation Dosing: INR as of 09/26/2014 and Previous Dosing Information    INR Dt INR Goal Wkly Tot Sun Mon Tue Wed Thu Fri Sat     2.0-3.0 52.5 mg 7.5 mg 7.5 mg 7.5 mg 7.5 mg 7.5 mg 7.5 mg 7.5 mg    Anticoagulation Dose Instructions as of 09/26/2014      Total Sun Mon Tue Wed Thu Fri Sat   New Dose 52.5 mg 7.5 mg 7.5 mg 7.5 mg 7.5 mg 7.5 mg 7.5 mg 7.5 mg     (5 mg x 1.5)  (5 mg x 1.5)  (5 mg x 1.5)  (5 mg x 1.5)  (5 mg x 1.5)  (5 mg x 1.5)  (5 mg x 1.5)                         Description        Patient has not been taking warfarin due to problems swallowing. Resumed previous dose.      PLAN Patient restarted on previous dose of 52.5 mg weekly (patient educated about crushing warfarin tablets and mixing with foods like yogurt, honey, or applesauce).  Patient Instructions  Patient educated about medication as defined in this encounter and verbalized understanding by repeating back instructions provided.   Follow-up Return in about 6 days (around 10/02/2014) for For follow up INR on 10/02/14 at 3pm.  Marzetta Board Clinical Pharmacist  15 minutes spent face-to-face with  the patient during the encounter. 75% of time spent on education. 25% of time was spent on assessment and plan.

## 2014-09-26 NOTE — Assessment & Plan Note (Deleted)
Present with swallowing pills and solid foods. Also with hx of weightloss. As she is young without family hx of malig, low suspicion for malig at this time. Differentials include Achalasia, spasm.  Plan- Barium Swallow, if not helpful might need GI referral for EGD. - CBC- Vomiting with blood.

## 2014-09-26 NOTE — Patient Instructions (Signed)
It was nice seeing you today.  We have ordered some tests for you. We will like to see you in 2 weeks, when all the results are ready and then decide what to do next.  Also please take you Protonix everyday, this is important.  Also please take your coumadin, we will consider other alternatives to this when we have sorted out what is going on presently.  Also please Avoid Alcohol, ibuprofen, Advil, Alieve, Mobic or medications called NSAIDS. You can take tylenol for pain.

## 2014-09-26 NOTE — Assessment & Plan Note (Signed)
Present with swallowing pills and solid foods. Also with hx of weightloss. As she is young without family hx of malig, low suspicion for malig at this time. Differentials include Achalasia, spasm.  Plan- Barium Swallow, if not helpful might need GI referral for EGD. - CBC- Vomiting with blood. 

## 2014-09-26 NOTE — Progress Notes (Signed)
Patient ID: Lindsey [name], female   DOB: 09/03/1970, 44 y.o.   MRN: [id number]    Subjective:   Patient ID: Lindsey [name] female   DOB: [birth year] 44 y.o.   MRN: [id number]  HPI: Ms.Lindsey [name] is a 44 y.o. with PMH listed below. I am pts PCP and just meeting her for the first time.   Presented today with multiple complaints- Dysphagia, Diarrhea, weight loss, vomiting and feeling tired. Patient says she has been having diarrhea for 4-5 weeks, up to 20 times a day, mostly watery, she says he wakes up about 3-4 times at a night to have watery loose stools. She sometimes/a few has abdominal pains  With diarrhea. She also has associated vomting that started about 3-4 weeks ago also. Sometimes she has blood in her vomit. She says she throws up everything she tries to keep down.  She also says she started having difficulty swallowing, this started 3 weeks ago, she has no problem swallowing liquids but she has diffculty swallowing liquids. She has nop pain with swallowing. She denies family hx of malignancy. She also endorses a 30 Lb weightloss over the past month, but per chart review, its more like 13Lb weightloss over the past year. She endorses been drenched in sweat at night, but during the day- she feels cold sometimes or hot and sometimes drenched in sweat. She has not seen her period in 3-4 months. She does not think she is pregnant. She denies discharge from her breast. She endorses feeling sad and crying more recently because she feels nobody is taking all the problems seriously and she knows something is wrong with her. She says she has not felt like her normal self in 3 years. She endorses having some palpations but says that it resolves with sublingual NTG. She denies joint pain, itching, rash, leg swelling, SOB, Cough, chest pain, dysuria. She occasionally drinks alcohol now- 1-2 beers a week, or socially, smokes 3 cigs per day, denies illicit drug use.  Past Medical History  Diagnosis  Date  . Stroke     assoc with short term memory loss and right peripheral vision loss; age 71   . Blood transfusion   . Anxiety   . Coronary artery disease     Apical LAD infarction '00; NSTEMI s/p BMS to prox LAD '09; Cath 12/2011 single vessel CAD w/ patent LAD stent w/ stable mild ISR, nl LV systolic fxn  . Anemia   . Antithrombin III deficiency     ?pt not sure if true diagnosis  . Tobacco abuse   . High blood pressure   . Myocardial infarction    Current Outpatient Prescriptions  Medication Sig Dispense Refill  . acetaminophen (TYLENOL) 325 MG tablet Take 2 tablets (650 mg total) by mouth every 6 (six) hours as needed for moderate pain, fever or headache. 30 tablet 0  . atorvastatin (LIPITOR) 40 MG tablet Take 2 tablets (80 mg total) by mouth daily. (Patient taking differently: Take 80 mg by mouth daily at 6 PM. ) 90 tablet 11  . cephALEXin (KEFLEX) 500 MG capsule Take 1 capsule (500 mg total) by mouth 4 (four) times daily. 40 capsule 0  . clindamycin (CLEOCIN) 150 MG capsule Take 2 capsules (300 mg total) by mouth 3 (three) times daily. May dispense as 150mg  capsules (Patient not taking: Reported on 06/14/2014) 60 capsule 0  . clopidogrel (PLAVIX) 75 MG tablet Take 75 mg by mouth daily.    Marland Kitchen enoxaparin (LOVENOX) 100  MG/ML injection Inject 0.75 mLs (75 mg total) into the skin daily. (Patient not taking: Reported on 06/14/2014) 7 Syringe 0  . HYDROcodone-acetaminophen (NORCO/VICODIN) 5-325 MG per tablet Take 2 tablets by mouth every 4 (four) hours as needed. 15 tablet 0  . hydrocortisone (ANUSOL-HC) 25 MG suppository Place 1 suppository (25 mg total) rectally 2 (two) times daily. (Patient not taking: Reported on 05/12/2014) 12 suppository 3  . hydrocortisone-pramoxine (PROCTOFOAM-HC) rectal foam Place 1 applicator rectally 2 (two) times daily. 10 g 0  . ibuprofen (ADVIL,MOTRIN) 600 MG tablet Take 600 mg by mouth every 6 (six) hours as needed for moderate pain.    . metoprolol succinate  (TOPROL XL) 25 MG 24 hr tablet Take 1 tablet (25 mg total) by mouth daily. 30 tablet 12  . nitroGLYCERIN (NITROSTAT) 0.4 MG SL tablet Place 1 tablet (0.4 mg total) under the tongue every 5 (five) minutes as needed for chest pain (up to 3 doses). 25 tablet 3  . ondansetron (ZOFRAN ODT) 4 MG disintegrating tablet Take 1 tablet (4 mg total) by mouth every 8 (eight) hours as needed for nausea or vomiting. 10 tablet 0  . oxyCODONE-acetaminophen (ROXICET) 5-325 MG per tablet Take 1-2 tablets by mouth every 4 (four) hours as needed for severe pain. (Patient not taking: Reported on 06/14/2014) 40 tablet 0  . pantoprazole (PROTONIX) 40 MG tablet Take 1 tablet (40 mg total) by mouth daily. (Patient taking differently: Take 40 mg by mouth as needed (for heartburn). ) 30 tablet 2  . potassium chloride SA (K-DUR,KLOR-CON) 20 MEQ tablet Take 1 tablet (20 mEq total) by mouth 2 (two) times daily. 10 tablet 0  . warfarin (COUMADIN) 7.5 MG tablet Take 1 tablet (7.5 mg total) by mouth one time only at 6 PM. 30 tablet 1   No current facility-administered medications for this visit.   Family History  Problem Relation Age of Onset  . Heart disease Brother     arrhythmia; died  . Breast cancer Maternal Aunt    History   Social History  . Marital Status: Divorced    Spouse Name: N/A  . Number of Children: 2  . Years of Education: N/A   Social History Main Topics  . Smoking status: Current Every Day Smoker -- 0.20 packs/day for 1 years    Types: Cigarettes  . Smokeless tobacco: Never Used     Comment: DOWN  TO 3 CIGARETTES  A DAY  . Alcohol Use: 0.0 oz/week    0 Cans of beer per week  . Drug Use: Yes    Special: Cocaine     Comment: hx of use x 1  . Sexual Activity: No   Other Topics Concern  . None   Social History Narrative   Review of Systems: Per HPI.  Objective:  Physical Exam: Filed Vitals:   09/26/14 1353  BP: 137/91  Pulse: 96  Temp: 98.7 F (37.1 C)  TempSrc: Oral  Height:   (1.676 m)  Weight: 167 lb 9.6 oz (76.023 kg)  SpO2: 100%   GENERAL- alert, co-operative, appears as stated age, not in any distress, husband present also. HEENT- Atraumatic, normocephalic, PERRL, EOMI, oral mucosa appears slightly dry, neck supple. CARDIAC- RRR, no murmurs, rubs or gallops. RESP- Moving equal volumes of air, and clear to auscultation bilaterally, no wheezes or crackles, but breath sound coarse. ABDOMEN- Soft, nontender, no guarding or rebound, no palpable masses or organomegaly, bowel sounds present. BACK- Normal curvature of the spine, No tenderness  along the vertebrae, no CVA tenderness. NEURO- No obvious Cr N abnormality, strenght upper and lower extremities- 5/5, Gait- Normal. EXTREMITIES- pulse 2+, symmetric, no pedal edema. SKIN- Warm, dry, shows me some 3 ~0.3 by 0.3 mm spots on her left arm, and right thigh that look like healed acne spots that she says sis new, she has some acne on her back, also a 1 by 1 cm palpable swelling on the lateral side of her right thigh that she says is new, it is subcutaneous, not mobile and firm, normal kin colour overlying PSYCH- Normal mood and affect, appropriate thought content and speech.  Assessment & Plan:   The patient's case and plan of care was discussed with attending physician, Dr. Criselda Peaches.  Please see problem based charting for assessment and plan. - If work up is unrevealing, consider Checking why patient has not been seeing her periods- related to thyroid, Prolactin levels, FSh and LH ratios- premature menpause. - Also check Stool cultures, Guadia. - Check ECHo to futher evaluate ?thrombus in left atrium seen on CT scan on the 08/2014, but absent on echo done in 2015.  - Also appears yo be having both venous  And ?aterial thrombus- ?Antiphospholid ab syndrome.  -

## 2014-09-26 NOTE — Assessment & Plan Note (Addendum)
Ongoing for ? ~4-5 weeks per patient. Received keflex two weeks ago, but prior to that was given Clindamycin in ED for preseptal celluitis. She is not sure when diarrhea started, but thinks it is after she got the clindamycin. She wakes up at night to have loose stools- suggested of Secretory diarhea and she has diarrhea even when she does not eat. Lst colonoscopy noted Diverticulosis- but no biopsy was done. Low suspicioon for IBD- No gross blood in stool, has some intermittent bleeding hemorrhoids- Consider IBS as differential of exclusion- as no constipation or hx of relief of abdominal pain with diarrhea.  Plan- Stool for C. Diff - Stool Leukocytes - Stool electrolytes- help distinguish secretory and Osmotic diarrhea - TSH- As she has weightloss, drenching sweats also. - CMET - Might need refferal for colonoscopy and biopsy if work up fails to reveal cause. - CBC

## 2014-09-27 ENCOUNTER — Telehealth: Payer: Self-pay | Admitting: *Deleted

## 2014-09-27 NOTE — Telephone Encounter (Signed)
SPOKE WITH PATIENT, GAVE HER THE APPOINTMENT FOR SWALLOWING TEST / [phone] @ 9:30AM-ARRIVE 9:15AM / [facility] 1ST FLOOR X RAY / DO NOT EAT NOR DRINK  3 HR PRIOR TO THIS EXAM./ PATIENT WAS GIVEN THE PHONE NUMBER TO CALL IF NEEDING TO CHANGE THIS TIME [phone]

## 2014-09-29 ENCOUNTER — Ambulatory Visit (HOSPITAL_COMMUNITY): Payer: No Typology Code available for payment source

## 2014-10-02 ENCOUNTER — Ambulatory Visit (HOSPITAL_COMMUNITY): Payer: No Typology Code available for payment source

## 2014-10-02 ENCOUNTER — Ambulatory Visit: Payer: Medicaid Other

## 2014-10-02 NOTE — Progress Notes (Signed)
Internal Medicine Clinic Attending  Case discussed with Dr. Emokpae soon after the resident saw the patient.  We reviewed the resident's history and exam and pertinent patient test results.  I agree with the assessment, diagnosis, and plan of care documented in the resident's note. 

## 2014-10-09 ENCOUNTER — Telehealth: Payer: Self-pay | Admitting: Internal Medicine

## 2014-10-09 NOTE — Telephone Encounter (Signed)
Call to patient to confirm appointment for 10/10/14 at 1:45 no answer

## 2014-10-10 ENCOUNTER — Encounter: Payer: Self-pay | Admitting: Internal Medicine

## 2014-10-10 ENCOUNTER — Ambulatory Visit: Payer: Medicaid Other | Admitting: Internal Medicine

## 2014-10-30 ENCOUNTER — Ambulatory Visit: Payer: Medicaid Other

## 2014-10-30 ENCOUNTER — Ambulatory Visit: Payer: Medicaid Other | Admitting: Internal Medicine

## 2014-11-22 NOTE — Addendum Note (Signed)
Addended by: Onnie Boer on: 11/22/2014 08:57 AM   Modules accepted: Orders

## 2014-12-20 ENCOUNTER — Observation Stay (HOSPITAL_COMMUNITY)
Admission: EM | Admit: 2014-12-20 | Discharge: 2014-12-22 | Disposition: A | Discharge status: Home / Self Care | Payer: Medicaid Other | Attending: Internal Medicine | Admitting: Internal Medicine

## 2014-12-20 ENCOUNTER — Emergency Department (HOSPITAL_COMMUNITY): Payer: Medicaid Other

## 2014-12-20 ENCOUNTER — Encounter (HOSPITAL_COMMUNITY): Payer: Self-pay | Admitting: *Deleted

## 2014-12-20 DIAGNOSIS — I10 Essential (primary) hypertension: Secondary | ICD-10-CM | POA: Diagnosis not present

## 2014-12-20 DIAGNOSIS — I2510 Atherosclerotic heart disease of native coronary artery without angina pectoris: Secondary | ICD-10-CM | POA: Diagnosis not present

## 2014-12-20 DIAGNOSIS — R1310 Dysphagia, unspecified: Principal | ICD-10-CM

## 2014-12-20 DIAGNOSIS — F17210 Nicotine dependence, cigarettes, uncomplicated: Secondary | ICD-10-CM

## 2014-12-20 DIAGNOSIS — R945 Abnormal results of liver function studies: Secondary | ICD-10-CM

## 2014-12-20 DIAGNOSIS — R1110 Vomiting, unspecified: Secondary | ICD-10-CM

## 2014-12-20 DIAGNOSIS — I25118 Atherosclerotic heart disease of native coronary artery with other forms of angina pectoris: Secondary | ICD-10-CM

## 2014-12-20 DIAGNOSIS — Z9861 Coronary angioplasty status: Secondary | ICD-10-CM

## 2014-12-20 DIAGNOSIS — R1312 Dysphagia, oropharyngeal phase: Secondary | ICD-10-CM | POA: Diagnosis not present

## 2014-12-20 DIAGNOSIS — Z720 Tobacco use: Secondary | ICD-10-CM | POA: Diagnosis not present

## 2014-12-20 DIAGNOSIS — R0789 Other chest pain: Secondary | ICD-10-CM | POA: Diagnosis not present

## 2014-12-20 DIAGNOSIS — D649 Anemia, unspecified: Secondary | ICD-10-CM | POA: Diagnosis not present

## 2014-12-20 DIAGNOSIS — R198 Other specified symptoms and signs involving the digestive system and abdomen: Secondary | ICD-10-CM | POA: Diagnosis present

## 2014-12-20 DIAGNOSIS — I252 Old myocardial infarction: Secondary | ICD-10-CM

## 2014-12-20 DIAGNOSIS — K649 Unspecified hemorrhoids: Secondary | ICD-10-CM | POA: Diagnosis present

## 2014-12-20 DIAGNOSIS — K645 Perianal venous thrombosis: Secondary | ICD-10-CM

## 2014-12-20 DIAGNOSIS — Z7901 Long term (current) use of anticoagulants: Secondary | ICD-10-CM | POA: Diagnosis not present

## 2014-12-20 DIAGNOSIS — R079 Chest pain, unspecified: Secondary | ICD-10-CM | POA: Diagnosis present

## 2014-12-20 DIAGNOSIS — R110 Nausea: Secondary | ICD-10-CM

## 2014-12-20 DIAGNOSIS — R112 Nausea with vomiting, unspecified: Secondary | ICD-10-CM | POA: Insufficient documentation

## 2014-12-20 DIAGNOSIS — F329 Major depressive disorder, single episode, unspecified: Secondary | ICD-10-CM | POA: Diagnosis present

## 2014-12-20 DIAGNOSIS — F32A Depression, unspecified: Secondary | ICD-10-CM | POA: Diagnosis present

## 2014-12-20 DIAGNOSIS — K760 Fatty (change of) liver, not elsewhere classified: Secondary | ICD-10-CM

## 2014-12-20 DIAGNOSIS — Z8673 Personal history of transient ischemic attack (TIA), and cerebral infarction without residual deficits: Secondary | ICD-10-CM

## 2014-12-20 DIAGNOSIS — Z955 Presence of coronary angioplasty implant and graft: Secondary | ICD-10-CM

## 2014-12-20 DIAGNOSIS — D6859 Other primary thrombophilia: Secondary | ICD-10-CM | POA: Diagnosis not present

## 2014-12-20 DIAGNOSIS — R7989 Other specified abnormal findings of blood chemistry: Secondary | ICD-10-CM

## 2014-12-20 DIAGNOSIS — D689 Coagulation defect, unspecified: Secondary | ICD-10-CM

## 2014-12-20 DIAGNOSIS — F419 Anxiety disorder, unspecified: Secondary | ICD-10-CM | POA: Insufficient documentation

## 2014-12-20 DIAGNOSIS — R300 Dysuria: Secondary | ICD-10-CM | POA: Diagnosis present

## 2014-12-20 LAB — URINE MICROSCOPIC-ADD ON

## 2014-12-20 LAB — BASIC METABOLIC PANEL
Anion gap: 11 (ref 5–15)
BUN: 8 mg/dL (ref 6–20)
CO2: 25 mmol/L (ref 22–32)
CREATININE: 0.64 mg/dL (ref 0.44–1.00)
Calcium: 8.1 mg/dL — ABNORMAL LOW (ref 8.9–10.3)
Chloride: 103 mmol/L (ref 101–111)
GFR calc Af Amer: >60 mL/min (ref >60)
GLUCOSE: 108 mg/dL — AB (ref 65–99)
Potassium: 3.9 mmol/L (ref 3.5–5.1)
SODIUM: 139 mmol/L (ref 135–145)

## 2014-12-20 LAB — URINALYSIS, ROUTINE W REFLEX MICROSCOPIC
Bilirubin Urine: NEGATIVE
GLUCOSE, UA: NEGATIVE mg/dL
Hgb urine dipstick: NEGATIVE
KETONES UR: NEGATIVE mg/dL
NITRITE: NEGATIVE
PROTEIN: NEGATIVE mg/dL
Specific Gravity, Urine: 1.025 (ref 1.005–1.030)
Urobilinogen, UA: 1 mg/dL (ref 0.0–1.0)
pH: 6.5 (ref 5.0–8.0)

## 2014-12-20 LAB — CBC
HEMATOCRIT: 35.4 % — AB (ref 36.0–46.0)
Hemoglobin: 11.2 g/dL — ABNORMAL LOW (ref 12.0–15.0)
MCH: 29.2 pg (ref 26.0–34.0)
MCHC: 31.6 g/dL (ref 30.0–36.0)
MCV: 92.4 fL (ref 78.0–100.0)
PLATELETS: 208 10*3/uL (ref 150–400)
RBC: 3.83 MIL/uL — ABNORMAL LOW (ref 3.87–5.11)
RDW: 18.1 % — AB (ref 11.5–15.5)
WBC: 6.7 10*3/uL (ref 4.0–10.5)

## 2014-12-20 LAB — PROTIME-INR
INR: 1.08 (ref 0.00–1.49)
Prothrombin Time: 14.2 seconds (ref 11.6–15.2)

## 2014-12-20 LAB — I-STAT TROPONIN, ED
TROPONIN I, POC: 0 ng/mL (ref 0.00–0.08)
Troponin i, poc: 0 ng/mL (ref 0.00–0.08)

## 2014-12-20 MED ORDER — ASPIRIN 81 MG PO CHEW
324.0000 mg | CHEWABLE_TABLET | Freq: Once | ORAL | Status: AC
  Administered 2014-12-20: 324 mg via ORAL
  Filled 2014-12-20: qty 4

## 2014-12-20 MED ORDER — GI COCKTAIL ~~LOC~~
30.0000 mL | Freq: Once | ORAL | Status: AC
  Administered 2014-12-21: 30 mL via ORAL
  Filled 2014-12-20: qty 30

## 2014-12-20 MED ORDER — PANTOPRAZOLE SODIUM 40 MG IV SOLR
40.0000 mg | Freq: Two times a day (BID) | INTRAVENOUS | Status: DC
  Administered 2014-12-21 – 2014-12-22 (×4): 40 mg via INTRAVENOUS
  Filled 2014-12-20 (×4): qty 40

## 2014-12-20 MED ORDER — MORPHINE SULFATE (PF) 2 MG/ML IV SOLN
2.0000 mg | Freq: Once | INTRAVENOUS | Status: AC
  Administered 2014-12-20: 2 mg via INTRAVENOUS
  Filled 2014-12-20: qty 1

## 2014-12-20 MED ORDER — NITROGLYCERIN 0.4 MG SL SUBL
0.4000 mg | SUBLINGUAL_TABLET | SUBLINGUAL | Status: AC | PRN
  Administered 2014-12-20 (×3): 0.4 mg via SUBLINGUAL
  Filled 2014-12-20: qty 1

## 2014-12-20 MED ORDER — IOHEXOL 350 MG/ML SOLN
80.0000 mL | Freq: Once | INTRAVENOUS | Status: AC | PRN
  Administered 2014-12-20: 80 mL via INTRAVENOUS

## 2014-12-20 MED ORDER — LIDOCAINE HCL 2 % EX GEL
1.0000 "application " | Freq: Once | CUTANEOUS | Status: AC
  Administered 2014-12-20: 1 via TOPICAL
  Filled 2014-12-20: qty 20

## 2014-12-20 MED ORDER — ONDANSETRON HCL 4 MG/2ML IJ SOLN
4.0000 mg | Freq: Once | INTRAMUSCULAR | Status: AC
  Administered 2014-12-20: 4 mg via INTRAVENOUS
  Filled 2014-12-20: qty 2

## 2014-12-20 NOTE — ED Notes (Signed)
MD at bedside. 

## 2014-12-20 NOTE — ED Provider Notes (Signed)
CSN: [id number]     Arrival date & time 12/20/14  1823 History   First MD Initiated Contact with Patient 12/20/14 1923     Chief Complaint  Patient presents with  . Chest Pain  . Hemorrhoids     (Consider location/radiation/quality/duration/timing/severity/associated sxs/prior Treatment) HPI 44 y.o. Female with sharp sscp began at 2 p.m.  It is worse with inspiration.  Diaphoretic and nausea.  Took ntg wihtout relief.  Patient with history of 2 mi in past and history of cva.  Ho of antithrombin III deficiency and on lovenox.  She reports progressive dysphagia for several months.  No leg swelling, truama, no estrogen, no immobization.  Reports pain similar to prior mi.  She took aspirin but vomited after.    Past Medical History  Diagnosis Date  . Stroke (HCC)     assoc with short term memory loss and right peripheral vision loss; age 73   . Blood transfusion   . Anxiety   . Coronary artery disease     Apical LAD infarction '00; NSTEMI s/p BMS to prox LAD '09; Cath 12/2011 single vessel CAD w/ patent LAD stent w/ stable mild ISR, nl LV systolic fxn  . Anemia   . Antithrombin III deficiency (HCC)     ?pt not sure if true diagnosis  . Tobacco abuse   . High blood pressure   . Myocardial infarction [facility])    Past Surgical History  Procedure Laterality Date  . Tubal ligation    . Cardiac catheterization    . Esophagogastroduodenoscopy N/A 06/09/2012    Procedure: ESOPHAGOGASTRODUODENOSCOPY (EGD);  Surgeon: Theda Belfast, MD;  Location: [facility] ENDOSCOPY;  Service: Endoscopy;  Laterality: N/A;  . Coronary angioplasty    . Esophagogastroduodenoscopy N/A 08/02/2013    Procedure: ESOPHAGOGASTRODUODENOSCOPY (EGD);  Surgeon: Meryl Dare, MD;  Location: [facility] ENDOSCOPY;  Service: Endoscopy;  Laterality: N/A;  . Left heart catheterization with coronary angiogram N/A 12/24/2011    Procedure: LEFT HEART CATHETERIZATION WITH CORONARY ANGIOGRAM;  Surgeon: Kathleene Hazel, MD;  Location: [facility]  CATH LAB;  Service: Cardiovascular;  Laterality: N/A;  . Colonoscopy N/A 03/29/2014    Procedure: COLONOSCOPY;  Surgeon: Louis Meckel, MD;  Location: [facility] ENDOSCOPY;  Service: Endoscopy;  Laterality: N/A;   Family History  Problem Relation Age of Onset  . Heart disease Brother     arrhythmia; died  . Breast cancer Maternal Aunt    Social History  Substance Use Topics  . Smoking status: Current Every Day Smoker -- 0.20 packs/day for 1 years    Types: Cigarettes  . Smokeless tobacco: Never Used     Comment: DOWN  TO 3 CIGARETTES  A DAY  . Alcohol Use: 0.0 oz/week    0 Cans of beer per week   OB History    No data available     Review of Systems    Allergies  Review of patient's allergies indicates no known allergies.  Home Medications   Prior to Admission medications   Medication Sig Start Date End Date Taking? Authorizing Provider  acetaminophen (TYLENOL) 325 MG tablet Take 2 tablets (650 mg total) by mouth every 6 (six) hours as needed for moderate pain, fever or headache. 02/21/14   Tasrif Ahmed, MD  atorvastatin (LIPITOR) 40 MG tablet Take 2 tablets (80 mg total) by mouth daily. Patient taking differently: Take 80 mg by mouth daily at 6 PM.  02/21/14 02/21/15  Hyacinth Meeker, MD  clopidogrel (PLAVIX) 75 MG tablet Take 75  mg by mouth daily.    Historical Provider, MD  HYDROcodone-acetaminophen (NORCO/VICODIN) 5-325 MG per tablet Take 2 tablets by mouth every 4 (four) hours as needed. 09/10/14   Kaitlyn Szekalski, PA-C  hydrocortisone (ANUSOL-HC) 25 MG suppository Place 1 suppository (25 mg total) rectally 2 (two) times daily. Patient not taking: Reported on 05/12/2014 03/30/14   Alexa Dulcy Fanny, MD  hydrocortisone-pramoxine [facility]) rectal foam Place 1 applicator rectally 2 (two) times daily. 06/15/14   Devoria Albe, MD  metoprolol succinate (TOPROL XL) 25 MG 24 hr tablet Take 1 tablet (25 mg total) by mouth daily. 03/03/14   Lewayne Bunting, MD  nitroGLYCERIN (NITROSTAT)  0.4 MG SL tablet Place 1 tablet (0.4 mg total) under the tongue every 5 (five) minutes as needed for chest pain (up to 3 doses). 10/14/13   Beatrice Lecher, PA-C  ondansetron (ZOFRAN ODT) 4 MG disintegrating tablet Take 1 tablet (4 mg total) by mouth every 8 (eight) hours as needed for nausea or vomiting. 09/10/14   Emilia Beck, PA-C  oxyCODONE-acetaminophen (ROXICET) 5-325 MG per tablet Take 1-2 tablets by mouth every 4 (four) hours as needed for severe pain. Patient not taking: Reported on 06/14/2014 03/30/14   Courtney Paris, MD  pantoprazole (PROTONIX) 40 MG tablet Take 1 tablet (40 mg total) by mouth daily. Patient taking differently: Take 40 mg by mouth as needed (for heartburn).  03/30/14   Courtney Paris, MD  potassium chloride SA (K-DUR,KLOR-CON) 20 MEQ tablet Take 1 tablet (20 mEq total) by mouth 2 (two) times daily. 06/15/14   Devoria Albe, MD  warfarin (COUMADIN) 7.5 MG tablet Take 1 tablet (7.5 mg total) by mouth one time only at 6 PM. 03/30/14   Courtney Paris, MD   BP 125/89 mmHg  Pulse 82  Temp(Src) 98.4 F (36.9 C) (Oral)  Resp 18  SpO2 100% Physical Exam  ED Course  Procedures (including critical care time) Labs Review Labs Reviewed  BASIC METABOLIC PANEL - Abnormal; Notable for the following:    Glucose, Bld 108 (*)    Calcium 8.1 (*)    All other components within normal limits  CBC - Abnormal; Notable for the following:    RBC 3.83 (*)    Hemoglobin 11.2 (*)    HCT 35.4 (*)    RDW 18.1 (*)    All other components within normal limits  I-STAT TROPOININ, ED    Imaging Review Dg Chest 2 View  12/20/2014   CLINICAL DATA:  Chest pain today  EXAM: CHEST  2 VIEW  COMPARISON:  05/11/2014  FINDINGS: Normal heart size. Clear lungs. No pneumothorax or pleural effusion.  IMPRESSION: No active cardiopulmonary disease.   Electronically Signed   By: Jolaine Click M.D.   On: 12/20/2014 19:26   I have personally reviewed and evaluated these images and lab results as part of my medical  decision-making.   EKG Interpretation   Date/Time:  Wednesday December 20 2014 18:40:33 EDT Ventricular Rate:  93 PR Interval:  138 QRS Duration: 66 QT Interval:  348 QTC Calculation: 432 R Axis:   91 Text Interpretation:  Normal sinus rhythm with sinus arrhythmia Rightward  axis Low voltage QRS Septal infarct , age undetermined Abnormal ECG No  significant change since last tracing Confirmed by Greyden Besecker MD, Duwayne Heck  [phone]) on 12/20/2014 7:24:17 PM      MDM  69 old female with known coronary artery disease presents today with sharp left chest pain. Patient is on Lovenox for  anti-thrombin 3 deficiency. Pain has decreased from 9-5 out of 10 here after morphine. She is given sublingual nitroglycerin without any significant response. She received aspirin here in emergency department.Given patien'ts history of antithrombin 3 deficiency and cad plan admission for further evaluation of chest pain.   Discussed admission with Dr. Tasia Catchings and in to see patient.  Diagnosis Chest pain Antithrombin 3 deficiency cad  Margarita Grizzle, MD 12/21/14 0002

## 2014-12-20 NOTE — ED Notes (Signed)
Pt reports having chest pains that started today, left side of chest and sharp. Also having recent severe rectal pain due to hemorrhoids.

## 2014-12-20 NOTE — ED Notes (Signed)
Pt sts she has been having difficulty swallowing recently (seen by PCP) and they have been giving her Lovenox shots to take at home.  Pt sts she ran out and "was lazy" and has not been taking her blood thinners x 1 week.  Pt has a hx of 2 MI and 1 stent placement.  Pt also has a hx of a "clotting disorder"

## 2014-12-20 NOTE — ED Notes (Signed)
Admitting made aware of pt's pain.  Will attempt GI cocktail and follow-up with MD and patient placement.

## 2014-12-20 NOTE — ED Notes (Signed)
PT complaining of Chest Pain 7/10

## 2014-12-20 NOTE — ED Notes (Signed)
Pharmacy at bedside

## 2014-12-20 NOTE — ED Notes (Signed)
Admitting at bedside 

## 2014-12-20 NOTE — H&P (Signed)
Date: 12/20/2014               Patient Name:  Lindsey [name] MRN: [id number]  DOB: November 12, 1970 Age / Sex: 44 y.o., female   PCP: Onnie Boer, MD         Medical Service: Internal Medicine Teaching Service         Attending Physician: Dr. Cliffton Asters, MD    First Contact: Dr. Eston Esters Pager: [phone]  Second Contact: Dr. Boykin Peek Pager: [phone]       After Hours (After 5p/  First Contact Pager: [phone]  weekends / holidays): Second Contact Pager: [phone]   Chief Complaint: "My chest hurts."  History of Present Illness: Lindsey [name] is a 44 year old African American lady with self-reported antithrombin III deficiency on lovenox, coronary artery disease with two myocardial infarctions status post bare metal stent placement in 2009, apical mural thrombus that was not present on an echo in 2015, transient ischemic attack in 2010, and hypertension who presents with chest pain. Her pain began acutely this afternoon while watching television; the pain was 8/10, substernal, stabbing, associated with diaphoresis, and was unrelieved with one sublingual nitroglycerin. She said this felt like her previous heart attack in 2009 so she decided to come to the hospital. The pain was non-exertional, without a pleuritic component, not radiating to her back or arm, and not associated with a change in position or related to eating. Notably, she has also had trouble initiating swallowing for the last month or so and has not been able to swallow pills for the past 3 weeks. Her coumadin was changed to lovenox two weeks ago for this reason, but she has been unable to take her protonix during this time. She also complains of hemorrhoids that have thrombosed and are sticking out of her anus. She has been using preparation H that hasn't helped much and would like to talk to a surgeon about removal.  In the emergency department, her vital signs were normal, EKG showed sinus rhythm without ischemic changes or  changes from prior EKG, two point-of-care troponins were normal, chest x-ray was unremarkable, and CT chest angiography was negative for pulmonary embolus.  Meds: Current Facility-Administered Medications  Medication Dose Route Frequency Provider Last Rate Last Dose  . gi cocktail (Maalox,Lidocaine,Donnatal)  30 mL Oral Once Tasrif Ahmed, MD      . Melene Muller ON 12/21/2014] pantoprazole (PROTONIX) injection 40 mg  40 mg Intravenous Q12H Hyacinth Meeker, MD       Current Outpatient Prescriptions  Medication Sig Dispense Refill  . clopidogrel (PLAVIX) 75 MG tablet Take 75 mg by mouth daily.    Marland Kitchen enoxaparin (LOVENOX) 150 MG/ML injection Inject 150 mg into the skin.    . hydrocortisone-pramoxine (PROCTOFOAM-HC) rectal foam Place 1 applicator rectally 2 (two) times daily. 10 g 0  . nitroGLYCERIN (NITROSTAT) 0.4 MG SL tablet Place 1 tablet (0.4 mg total) under the tongue every 5 (five) minutes as needed for chest pain (up to 3 doses). 25 tablet 3  . acetaminophen (TYLENOL) 325 MG tablet Take 2 tablets (650 mg total) by mouth every 6 (six) hours as needed for moderate pain, fever or headache. 30 tablet 0  . atorvastatin (LIPITOR) 40 MG tablet Take 2 tablets (80 mg total) by mouth daily. (Patient not taking: Reported on 12/20/2014) 90 tablet 11  . HYDROcodone-acetaminophen (NORCO/VICODIN) 5-325 MG per tablet Take 2 tablets by mouth every 4 (four) hours as needed. (Patient not taking: Reported on 12/20/2014) 15  tablet 0  . hydrocortisone (ANUSOL-HC) 25 MG suppository Place 1 suppository (25 mg total) rectally 2 (two) times daily. (Patient not taking: Reported on 05/12/2014) 12 suppository 3  . metoprolol succinate (TOPROL XL) 25 MG 24 hr tablet Take 1 tablet (25 mg total) by mouth daily. (Patient not taking: Reported on 12/20/2014) 30 tablet 12  . ondansetron (ZOFRAN ODT) 4 MG disintegrating tablet Take 1 tablet (4 mg total) by mouth every 8 (eight) hours as needed for nausea or vomiting. 10 tablet 0  .  oxyCODONE-acetaminophen (ROXICET) 5-325 MG per tablet Take 1-2 tablets by mouth every 4 (four) hours as needed for severe pain. (Patient not taking: Reported on 06/14/2014) 40 tablet 0  . pantoprazole (PROTONIX) 40 MG tablet Take 1 tablet (40 mg total) by mouth daily. (Patient not taking: Reported on 12/20/2014) 30 tablet 2  . potassium chloride SA (K-DUR,KLOR-CON) 20 MEQ tablet Take 1 tablet (20 mEq total) by mouth 2 (two) times daily. (Patient not taking: Reported on 12/20/2014) 10 tablet 0  . warfarin (COUMADIN) 7.5 MG tablet Take 1 tablet (7.5 mg total) by mouth one time only at 6 PM. (Patient not taking: Reported on 12/20/2014) 30 tablet 1    Allergies: Allergies as of 12/20/2014  . (No Known Allergies)   Past Medical History  Diagnosis Date  . Stroke (HCC)     assoc with short term memory loss and right peripheral vision loss; age 62   . Blood transfusion   . Anxiety   . Coronary artery disease     Apical LAD infarction '00; NSTEMI s/p BMS to prox LAD '09; Cath 12/2011 single vessel CAD w/ patent LAD stent w/ stable mild ISR, nl LV systolic fxn  . Anemia   . Antithrombin III deficiency (HCC)     ?pt not sure if true diagnosis  . Tobacco abuse   . High blood pressure   . Myocardial infarction [facility])    Past Surgical History  Procedure Laterality Date  . Tubal ligation    . Cardiac catheterization    . Esophagogastroduodenoscopy N/A 06/09/2012    Procedure: ESOPHAGOGASTRODUODENOSCOPY (EGD);  Surgeon: Theda Belfast, MD;  Location: [facility] ENDOSCOPY;  Service: Endoscopy;  Laterality: N/A;  . Coronary angioplasty    . Esophagogastroduodenoscopy N/A 08/02/2013    Procedure: ESOPHAGOGASTRODUODENOSCOPY (EGD);  Surgeon: Meryl Dare, MD;  Location: [facility] ENDOSCOPY;  Service: Endoscopy;  Laterality: N/A;  . Left heart catheterization with coronary angiogram N/A 12/24/2011    Procedure: LEFT HEART CATHETERIZATION WITH CORONARY ANGIOGRAM;  Surgeon: Kathleene Hazel, MD;  Location: [facility] CATH LAB;   Service: Cardiovascular;  Laterality: N/A;  . Colonoscopy N/A 03/29/2014    Procedure: COLONOSCOPY;  Surgeon: Louis Meckel, MD;  Location: [facility] ENDOSCOPY;  Service: Endoscopy;  Laterality: N/A;   Family History  Problem Relation Age of Onset  . Heart disease Brother     arrhythmia; died  . Breast cancer Maternal Aunt    Social History   Social History  . Marital Status: Divorced    Spouse Name: N/A  . Number of Children: 2  . Years of Education: N/A   Occupational History  . Not on file.   Social History Main Topics  . Smoking status: Current Every Day Smoker -- 0.20 packs/day for 1 years    Types: Cigarettes  . Smokeless tobacco: Never Used     Comment: DOWN  TO 3 CIGARETTES  A DAY  . Alcohol Use: 0.0 oz/week    0 Cans of  beer per week  . Drug Use: Yes    Special: Cocaine     Comment: hx of use x 1  . Sexual Activity: No   Other Topics Concern  . Not on file   Social History Narrative    Review of Systems  Constitutional: Positive for weight loss, malaise/fatigue and diaphoresis. Negative for fever and chills.  Eyes: Negative for blurred vision and double vision.  Respiratory: Negative for cough, hemoptysis and shortness of breath.   Cardiovascular: Positive for chest pain. Negative for palpitations, orthopnea and claudication.  Gastrointestinal: Positive for nausea. Negative for vomiting, abdominal pain, diarrhea and constipation.  Genitourinary: Negative for dysuria.  Musculoskeletal: Negative for myalgias.  Skin: Negative for rash.  Neurological: Negative for dizziness, sensory change, focal weakness, loss of consciousness, weakness and headaches.   Physical Exam: Blood pressure 117/85, pulse 74, temperature 98.4 F (36.9 C), temperature source Oral, resp. rate 10, SpO2 100 %. Physical Exam  Constitutional: She appears well-developed and well-nourished. No distress.  HENT:  Head: Normocephalic and atraumatic.  Eyes: Conjunctivae and EOM are normal.    Neck: Normal range of motion. Neck supple.  Cardiovascular: Normal rate, regular rhythm, normal heart sounds and intact distal pulses.   No murmur heard. Pulmonary/Chest: Effort normal and breath sounds normal. No respiratory distress. She has no wheezes. She exhibits no tenderness.  Abdominal: Soft. Bowel sounds are normal. She exhibits no distension. There is no tenderness.  Neurological: She is alert. No cranial nerve deficit.  Skin: Skin is warm and dry. No rash noted.  Psychiatric: She has a normal mood and affect. Her behavior is normal.   Lab results: Basic Metabolic Panel:  Recent Labs  16/10/96 1837  NA 139  K 3.9  CL 103  CO2 25  GLUCOSE 108*  BUN 8  CREATININE 0.64  CALCIUM 8.1*   CBC:  Recent Labs  12/20/14 1837  WBC 6.7  HGB 11.2*  HCT 35.4*  MCV 92.4  PLT 208   Coagulation:  Recent Labs  12/20/14 1837  LABPROT 14.2  INR 1.08    Urinalysis:  Recent Labs  12/20/14 2232  COLORURINE AMBER*  LABSPEC 1.025  PHURINE 6.5  GLUCOSEU NEGATIVE  HGBUR NEGATIVE  BILIRUBINUR NEGATIVE  KETONESUR NEGATIVE  PROTEINUR NEGATIVE  UROBILINOGEN 1.0  NITRITE NEGATIVE  LEUKOCYTESUR MODERATE*   Imaging results:  Dg Chest 2 View  12/20/2014   CLINICAL DATA:  Chest pain today  EXAM: CHEST  2 VIEW  COMPARISON:  05/11/2014  FINDINGS: Normal heart size. Clear lungs. No pneumothorax or pleural effusion.  IMPRESSION: No active cardiopulmonary disease.   Electronically Signed   By: Jolaine Click M.D.   On: 12/20/2014 19:26   Ct Angio Chest Pe W/cm &/or Wo Cm  12/20/2014   CLINICAL DATA:  Acute onset of mid to left-sided chest pain and shortness of breath. Initial encounter.  EXAM: CT ANGIOGRAPHY CHEST WITH CONTRAST  TECHNIQUE: Multidetector CT imaging of the chest was performed using the standard protocol during bolus administration of intravenous contrast. Multiplanar CT image reconstructions and MIPs were obtained to evaluate the vascular anatomy.  CONTRAST:  80mL  OMNIPAQUE IOHEXOL 350 MG/ML SOLN  COMPARISON:  Chest radiograph performed earlier today at 6:56 p.m.  FINDINGS: There is no evidence of pulmonary embolus.  Minimal left-sided atelectasis is noted. The lungs are otherwise clear. There is no evidence of significant focal consolidation, pleural effusion or pneumothorax. No masses are identified; no abnormal focal contrast enhancement is seen.  A coronary artery  stent is noted on the left side of the mediastinum. The mediastinum is unremarkable in appearance. No mediastinal lymphadenopathy is seen. No pericardial effusion is identified. The great vessels are grossly unremarkable. No axillary lymphadenopathy is seen. The visualized portions of the thyroid gland are unremarkable in appearance.  The visualized portions of the liver and spleen are unremarkable. The visualized portions of the pancreas, gallbladder, stomach, adrenal glands and kidneys are within normal limits.  No acute osseous abnormalities are seen.  Review of the MIP images confirms the above findings.  IMPRESSION: 1. No evidence of pulmonary embolus. 2. Minimal left-sided atelectasis noted.  Lungs otherwise clear.   Electronically Signed   By: Roanna Raider M.D.   On: 12/20/2014 23:54   Other results: EKG: Normal sinus rhythm, TWI in V1 and V2, unchanged from prior.  Assessment & Plan by Problem: Lindsey [name] is a very friendly 44 year old African American lady with an idiopathic clotting disorder, self-reported to be antithrombin III deficiency, coronary artery disease status post bare metal stents, and gastroesophageal reflux disease presenting with atypical angina with negative cardiac markers, non-ischemic EKG, and negative CT pulmonary angiogram. Of course coronary artery thrombus remains on the differential, especially since she hasn't been able to keep her clopidogrel down due to her swallowing issue, but an alternative theory is worsening gastritis, as she has been unable to swallow pills  including pantoprazole for the last month due to a mysterious difficulty initiating swallowing. Regarding her difficulty swallowing, this sounds like an oropharyngeal motility issue, not odynophagia or a problem within the esophagus, so we will evaluate with a barium swallow study and consult speech therapy for further evaluation. I do not know whether this is an obstructive issue or perhaps an upper motor neuron issue; she does not have any cranial nerve deficits or change in her voice. I do not thinks she has a Zenker's diverticulum because she does not fit the typical profile nor does she complain of halitosis or food getting stuck in her throat, but I suppose it is possible. Regardless, because she is on many important medications, it is important she is able to swallow them. Regarding her thrombosed hemorrhoids, we will provide topical lidocaine gel but she would certainly benefit from surgical consultation given the chronicity of this problem.  Atypical angina: Per above, differential includes acute coronary syndrome versus gastritis. -We will continue to trend troponins -Maalox, lidocaine, donnatal cocktail -IV pantoprazole 40mg  twice daily  Oropharyngeal dysphagia: Per above, differential includes obstructive mass versus CNS issue -Barium swallow tomorrow -Consult to speech therapy -Can consider manometry study or endoscopy going forward  Idiopathic clotting disorder: Per above. She says she was diagnosed with antithrombin III deficiency in [place], [place] in 2005 but records here have shown numerous normal to high antithrombin activity levels since 2009. Regardless she likely has a clotting disorder given her personal and family history. -Continue lovenox 70mg  twice daily  Hemorrhoids: Per above -Topical lidocaine -She will need outpatient surgical consultation for removal  Dispo: Disposition is deferred at this time, awaiting improvement of current medical problems.  The patient does  have a current PCP (Ejiroghene Wendall Stade, MD) and does need an [facility] hospital follow-up appointment after discharge.  The patient does not have transportation limitations that hinder transportation to clinic appointments.  Signed: Selina Cooley, MD 12/20/2014, 11:57 PM

## 2014-12-21 ENCOUNTER — Observation Stay (HOSPITAL_COMMUNITY): Payer: Medicaid Other

## 2014-12-21 ENCOUNTER — Encounter: Payer: Self-pay | Admitting: Gastroenterology

## 2014-12-21 ENCOUNTER — Other Ambulatory Visit: Payer: Self-pay | Admitting: Physician Assistant

## 2014-12-21 DIAGNOSIS — R1310 Dysphagia, unspecified: Principal | ICD-10-CM

## 2014-12-21 DIAGNOSIS — R072 Precordial pain: Secondary | ICD-10-CM

## 2014-12-21 DIAGNOSIS — R112 Nausea with vomiting, unspecified: Secondary | ICD-10-CM | POA: Diagnosis not present

## 2014-12-21 DIAGNOSIS — R110 Nausea: Secondary | ICD-10-CM | POA: Diagnosis not present

## 2014-12-21 DIAGNOSIS — F419 Anxiety disorder, unspecified: Secondary | ICD-10-CM | POA: Diagnosis not present

## 2014-12-21 DIAGNOSIS — R0789 Other chest pain: Secondary | ICD-10-CM

## 2014-12-21 DIAGNOSIS — I2510 Atherosclerotic heart disease of native coronary artery without angina pectoris: Secondary | ICD-10-CM | POA: Diagnosis not present

## 2014-12-21 DIAGNOSIS — R300 Dysuria: Secondary | ICD-10-CM | POA: Diagnosis present

## 2014-12-21 DIAGNOSIS — I252 Old myocardial infarction: Secondary | ICD-10-CM | POA: Diagnosis not present

## 2014-12-21 LAB — CBC WITH DIFFERENTIAL/PLATELET
BASOS ABS: 0.1 10*3/uL (ref 0.0–0.1)
Basophils Relative: 1 %
EOS PCT: 2 %
Eosinophils Absolute: 0.1 10*3/uL (ref 0.0–0.7)
HEMATOCRIT: 28.7 % — AB (ref 36.0–46.0)
Hemoglobin: 9.3 g/dL — ABNORMAL LOW (ref 12.0–15.0)
LYMPHS ABS: 1.9 10*3/uL (ref 0.7–4.0)
LYMPHS PCT: 34 %
MCH: 30.2 pg (ref 26.0–34.0)
MCHC: 32.4 g/dL (ref 30.0–36.0)
MCV: 93.2 fL (ref 78.0–100.0)
Monocytes Absolute: 0.5 10*3/uL (ref 0.1–1.0)
Monocytes Relative: 10 %
NEUTROS ABS: 2.9 10*3/uL (ref 1.7–7.7)
Neutrophils Relative %: 53 %
Platelets: 146 10*3/uL — ABNORMAL LOW (ref 150–400)
RBC: 3.08 MIL/uL — AB (ref 3.87–5.11)
RDW: 18.3 % — ABNORMAL HIGH (ref 11.5–15.5)
WBC: 5.5 10*3/uL (ref 4.0–10.5)

## 2014-12-21 LAB — COMPREHENSIVE METABOLIC PANEL
ALBUMIN: 2.7 g/dL — AB (ref 3.5–5.0)
ALT: 41 U/L (ref 14–54)
ANION GAP: 12 (ref 5–15)
AST: 80 U/L — AB (ref 15–41)
Alkaline Phosphatase: 104 U/L (ref 38–126)
BILIRUBIN TOTAL: 0.4 mg/dL (ref 0.3–1.2)
BUN: 9 mg/dL (ref 6–20)
CHLORIDE: 102 mmol/L (ref 101–111)
CO2: 22 mmol/L (ref 22–32)
Calcium: 7.5 mg/dL — ABNORMAL LOW (ref 8.9–10.3)
Creatinine, Ser: 0.63 mg/dL (ref 0.44–1.00)
GFR calc Af Amer: >60 mL/min (ref >60)
GLUCOSE: 90 mg/dL (ref 65–99)
POTASSIUM: 3.6 mmol/L (ref 3.5–5.1)
Sodium: 136 mmol/L (ref 135–145)
TOTAL PROTEIN: 5.6 g/dL — AB (ref 6.5–8.1)

## 2014-12-21 LAB — HIV ANTIBODY (ROUTINE TESTING W REFLEX): HIV SCREEN 4TH GENERATION: NONREACTIVE

## 2014-12-21 LAB — TROPONIN I: Troponin I: <0.03 ng/mL (ref <0.031)

## 2014-12-21 LAB — RPR: RPR: NONREACTIVE

## 2014-12-21 LAB — MRSA PCR SCREENING: MRSA by PCR: NEGATIVE

## 2014-12-21 MED ORDER — ENOXAPARIN SODIUM 80 MG/0.8ML ~~LOC~~ SOLN: 80.0000 mg | Freq: Two times a day (BID) | SUBCUTANEOUS | Status: DC

## 2014-12-21 MED ORDER — SUCRALFATE 1 GM/10ML PO SUSP
1.0000 g | Freq: Three times a day (TID) | ORAL | Status: DC
  Administered 2014-12-21 – 2014-12-22 (×4): 1 g via ORAL
  Filled 2014-12-21 (×4): qty 10

## 2014-12-21 MED ORDER — ACETAMINOPHEN 325 MG PO TABS
650.0000 mg | ORAL_TABLET | ORAL | Status: DC | PRN
  Administered 2014-12-21 – 2014-12-22 (×3): 650 mg via ORAL
  Filled 2014-12-21 (×3): qty 2

## 2014-12-21 MED ORDER — ONDANSETRON HCL 4 MG/2ML IJ SOLN
4.0000 mg | Freq: Once | INTRAMUSCULAR | Status: DC | PRN
  Administered 2014-12-21: 4 mg via INTRAVENOUS
  Filled 2014-12-21: qty 2

## 2014-12-21 MED ORDER — ONDANSETRON HCL 4 MG/2ML IJ SOLN
4.0000 mg | Freq: Four times a day (QID) | INTRAMUSCULAR | Status: DC | PRN
  Administered 2014-12-21: 4 mg via INTRAVENOUS
  Filled 2014-12-21: qty 2

## 2014-12-21 MED ORDER — HYDROCORTISONE 2.5 % RE CREA
TOPICAL_CREAM | Freq: Three times a day (TID) | RECTAL | Status: DC | PRN
  Filled 2014-12-21: qty 28.35

## 2014-12-21 MED ORDER — ASPIRIN EC 325 MG PO TBEC: 325.0000 mg | DELAYED_RELEASE_TABLET | Freq: Once | ORAL | Status: DC

## 2014-12-21 MED ORDER — MORPHINE SULFATE (PF) 2 MG/ML IV SOLN
1.0000 mg | INTRAVENOUS | Status: DC | PRN
  Administered 2014-12-21 – 2014-12-22 (×4): 1 mg via INTRAVENOUS
  Filled 2014-12-21 (×4): qty 1

## 2014-12-21 MED ORDER — CLOPIDOGREL BISULFATE 75 MG PO TABS
75.0000 mg | ORAL_TABLET | Freq: Every day | ORAL | Status: DC
  Administered 2014-12-21 – 2014-12-22 (×2): 75 mg via ORAL
  Filled 2014-12-21 (×2): qty 1

## 2014-12-21 MED ORDER — ENOXAPARIN SODIUM 80 MG/0.8ML ~~LOC~~ SOLN
80.0000 mg | Freq: Once | SUBCUTANEOUS | Status: AC
  Administered 2014-12-21: 80 mg via SUBCUTANEOUS
  Filled 2014-12-21 (×2): qty 0.8

## 2014-12-21 MED ORDER — SODIUM CHLORIDE 0.9 % IV SOLN
INTRAVENOUS | Status: AC
  Administered 2014-12-21 (×2): via INTRAVENOUS

## 2014-12-21 MED ORDER — PRAMOXINE-ZINC OXIDE IN MO 1-12.5 % RE OINT
TOPICAL_OINTMENT | Freq: Three times a day (TID) | RECTAL | Status: DC | PRN
  Filled 2014-12-21 (×2): qty 28.3

## 2014-12-21 MED ORDER — WITCH HAZEL-GLYCERIN EX PADS
MEDICATED_PAD | CUTANEOUS | Status: DC | PRN
Start: 2014-12-21 — End: 2014-12-22
  Filled 2014-12-21 (×2): qty 100

## 2014-12-21 MED ORDER — METRONIDAZOLE IVPB CUSTOM
2.0000 g | Freq: Once | INTRAVENOUS | Status: AC
  Administered 2014-12-21: 2 g via INTRAVENOUS
  Filled 2014-12-21: qty 400

## 2014-12-21 MED ORDER — ENOXAPARIN SODIUM 80 MG/0.8ML ~~LOC~~ SOLN
1.0000 mg/kg | Freq: Two times a day (BID) | SUBCUTANEOUS | Status: DC
  Administered 2014-12-21 – 2014-12-22 (×2): 70 mg via SUBCUTANEOUS
  Filled 2014-12-21 (×2): qty 0.8

## 2014-12-21 MED ORDER — METRONIDAZOLE 500 MG PO TABS: 2000.0000 mg | ORAL_TABLET | Freq: Once | ORAL | Status: DC

## 2014-12-21 MED ORDER — ENOXAPARIN SODIUM 80 MG/0.8ML ~~LOC~~ SOLN
80.0000 mg | Freq: Two times a day (BID) | SUBCUTANEOUS | Status: DC
  Administered 2014-12-21: 80 mg via SUBCUTANEOUS
  Filled 2014-12-21: qty 0.8

## 2014-12-21 NOTE — Progress Notes (Signed)
Subjective: Patient states her pain is somewhat better at 6/10 compared to 8/10 earlier. Her pain is located in one spot behind the sternum without radiation or associated numbness or tingling. She says she has been having nausea and vomiting for the last 2 days that she does not associate with meals or activity and difficulty swallowing which interferes with her ability to swallow medication pills. She finds relief with Zofran as it is a sublingual route, but has not been able to take her PPI or other pill medications. Also had to switch her oral coumadin to lovenox. She says the IV medicine she is receiving now is helpful and will try to eat. She states she was able to take her Plavix today without issue as it is a smaller pill. Objective: Vital signs in last 24 hours: Filed Vitals:   12/21/14 0140 12/21/14 0448 12/21/14 0740 12/21/14 1200  BP: 143/88  151/94 137/93  Pulse: 82  79 71  Temp: 98.4 F (36.9 C) 98.8 F (37.1 C)  98.7 F (37.1 C)  TempSrc: Oral Oral  Oral  Resp: 18  20 18   Height: 5\' 6"  (1.676 m)     Weight: 158 lb 4.8 oz (71.804 kg)     SpO2: 98%  95% 100%   Weight change:   Intake/Output Summary (Last 24 hours) at 12/21/14 1350 Last data filed at 12/21/14 0800  Gross per 24 hour  Intake      0 ml  Output      0 ml  Net      0 ml   General: resting in bed, no acute distress Cardiac: RRR, no rubs, murmurs or gallops Pulm: clear to auscultation bilaterally, moving normal volumes of air Abd: soft, nontender, nondistended, BS present Ext: warm and well perfused, no pedal edema Neuro: alert and oriented X3  Assessment/Plan: Principal Problem:   Chest pain Active Problems:   Essential hypertension   Coronary Artery Disease   Antithrombin III deficiency (HCC)   Long term current use of anticoagulant therapy   History of ischemic left PCA stroke   Hemorrhoids   Oropharyngeal dysphagia   Trichomonas infection   Oropharyngeal Dysphagia: Patient's dysphagia  appears to be with initiation of swallowing and esophagram/barium study today supports this with results showing oral stage difficulty with gagging with normal exam otherwise after initiation of swallow. She has difficulty with swallowing food and medication pills. She has additional nausea and vomiting which she does not associate with food, but is unable to treat with her PPI due to dysphagia. Her chest pain is most likely due to esophagitis from frequent vomiting and is known to have esophagitis per EGD in May 2015. Her cardiac enzymes and EKG do not suggest acute coronary syndrome. She will follow up outpatient for myoview. We will continue to treat her symptoms with PPI and anti-emetics and consider liquid/solution forms as available.  -Continue PPI (may be able crush tablet), if unable to swallow pill then Ranitidine syrup is an option. -Continue IV Ondansetron -Soft diet as tolerated, if patient able to eat and keep food down she might be able to discharge to home today with follow up in [facility] clinic and GI appointment.  Other -Speech evaluation appreciated   Trichomoniasis: Patient's urine microscopy positive for Trichomonas. Her only urinary complaint is frequency, no burning or vaginal discharge. Will treat with IV Metronidazole as patient has difficulty with oral medication. -IV Metronidazole 2 g once -Patient advised that her partner will need to be notified  and treated as well  Hemorrhoids: Patient with complaints of hemorrhoidal pain and pruritis. Was seen by [facility] in April of this year and determined to have prolapsed internal hemorrhoid and external hemorrhoids on the right without thrombosis. Recommendation was for medical management. If pain worsens will need further outpatient evaluation for possible hemorrhoidectomy. -Anusol-HC 2.5% rectal cream -TUCKS pads    Dispo: Disposition is deferred at this time, awaiting improvement of current medical problems.    The patient  does have a current PCP (Ejiroghene Wendall Stade, MD) and does need an [facility] hospital follow-up appointment after discharge.  The patient does not have transportation limitations that hinder transportation to clinic appointments.     Darreld Mclean, MD 12/21/2014, 1:50 PM

## 2014-12-21 NOTE — ED Notes (Signed)
Okay'd by Dr. Tasia Catchings to inform radiology okay to hold for DG Esophagus until morning.  Radiology states would need special permission from radiologist to perform this evening.

## 2014-12-21 NOTE — Consult Note (Signed)
CARDIOLOGY CONSULT NOTE  Patient ID: Lindsey [name], MRN: [id number], DOB/AGE: 1971-02-28 44 y.o. Admit date: 12/20/2014 Date of Consult: 12/21/2014  Primary Physician: [name], MD Primary Cardiologist: Dr. Jens Som Referring Physician: Dr. Orvan Falconer  Chief Complaint: Chest pain Reason for Consultation: Chest pain  HPI: 44 year old woman hospitalized yesterday for evaluation of chest pain. The patient has a self-reported history of antithrombin III deficiency, managed with long-term warfarin but more recently on Lovenox. She has CAD with previous PCI and history of myocardial infarction. She was treated with bare-metal stenting in 2009. She does have a history of an apical mural thrombus. Also has a history of TIA.  Yesterday the patient developed localized sharp chest pain in the left chest. The pain is nonradiating. There was associated diaphoresis. There is no pleuritic component. There is no relation to food intake. This episode lasted nearly all day yesterday. She still has a mild discomfort in the same area of the left chest. There is no shortness of breath.  The patient has a one-month history of frequent nausea and vomiting after eating. She has had a lot of problems swallowing pills. She underwent a barium esophagram this morning. She reports no other recent illness.  Initial studies demonstrate normal troponins, normal chest x-ray, and an EKG without any acute changes.  Medical History:  Past Medical History  Diagnosis Date  . Stroke (HCC)     assoc with short term memory loss and right peripheral vision loss; age 44   . Blood transfusion   . Anxiety   . Coronary artery disease     Apical LAD infarction '00; NSTEMI s/p BMS to prox LAD '09; Cath 12/2011 single vessel CAD w/ patent LAD stent w/ stable mild ISR, nl LV systolic fxn  . Anemia   . Antithrombin III deficiency (HCC)     ?pt not sure if true diagnosis  . Tobacco abuse   . High blood pressure   .  Myocardial infarction [facility])       Surgical History:  Past Surgical History  Procedure Laterality Date  . Tubal ligation    . Cardiac catheterization    . Esophagogastroduodenoscopy N/A 06/09/2012    Procedure: ESOPHAGOGASTRODUODENOSCOPY (EGD);  Surgeon: Theda Belfast, MD;  Location: [facility] ENDOSCOPY;  Service: Endoscopy;  Laterality: N/A;  . Coronary angioplasty    . Esophagogastroduodenoscopy N/A 08/02/2013    Procedure: ESOPHAGOGASTRODUODENOSCOPY (EGD);  Surgeon: Meryl Dare, MD;  Location: [facility] ENDOSCOPY;  Service: Endoscopy;  Laterality: N/A;  . Left heart catheterization with coronary angiogram N/A 12/24/2011    Procedure: LEFT HEART CATHETERIZATION WITH CORONARY ANGIOGRAM;  Surgeon: Kathleene Hazel, MD;  Location: [facility] CATH LAB;  Service: Cardiovascular;  Laterality: N/A;  . Colonoscopy N/A 03/29/2014    Procedure: COLONOSCOPY;  Surgeon: Louis Meckel, MD;  Location: [facility] ENDOSCOPY;  Service: Endoscopy;  Laterality: N/A;     Home Meds: Prior to Admission medications   Medication Sig Start Date End Date Taking? Authorizing Provider  clopidogrel (PLAVIX) 75 MG tablet Take 75 mg by mouth daily.   Yes Historical Provider, MD  enoxaparin (LOVENOX) 150 MG/ML injection Inject 150 mg into the skin.   Yes Historical Provider, MD  hydrocortisone-pramoxine [facility]) rectal foam Place 1 applicator rectally 2 (two) times daily. 06/15/14  Yes Devoria Albe, MD  nitroGLYCERIN (NITROSTAT) 0.4 MG SL tablet Place 1 tablet (0.4 mg total) under the tongue every 5 (five) minutes as needed for chest pain (up to 3 doses). 10/14/13  Yes Scott  Moishe Spice, PA-C  acetaminophen (TYLENOL) 325 MG tablet Take 2 tablets (650 mg total) by mouth every 6 (six) hours as needed for moderate pain, fever or headache. 02/21/14   Tasrif Ahmed, MD  atorvastatin (LIPITOR) 40 MG tablet Take 2 tablets (80 mg total) by mouth daily. Patient not taking: Reported on 12/20/2014 02/21/14 02/21/15  Hyacinth Meeker, MD    HYDROcodone-acetaminophen (NORCO/VICODIN) 5-325 MG per tablet Take 2 tablets by mouth every 4 (four) hours as needed. Patient not taking: Reported on 12/20/2014 09/10/14   Emilia Beck, PA-C  hydrocortisone (ANUSOL-HC) 25 MG suppository Place 1 suppository (25 mg total) rectally 2 (two) times daily. Patient not taking: Reported on 05/12/2014 03/30/14   Alexa Dulcy Fanny, MD  metoprolol succinate (TOPROL XL) 25 MG 24 hr tablet Take 1 tablet (25 mg total) by mouth daily. Patient not taking: Reported on 12/20/2014 03/03/14   Lewayne Bunting, MD  ondansetron (ZOFRAN ODT) 4 MG disintegrating tablet Take 1 tablet (4 mg total) by mouth every 8 (eight) hours as needed for nausea or vomiting. 09/10/14   Emilia Beck, PA-C  oxyCODONE-acetaminophen (ROXICET) 5-325 MG per tablet Take 1-2 tablets by mouth every 4 (four) hours as needed for severe pain. Patient not taking: Reported on 06/14/2014 03/30/14   Courtney Paris, MD  pantoprazole (PROTONIX) 40 MG tablet Take 1 tablet (40 mg total) by mouth daily. Patient not taking: Reported on 12/20/2014 03/30/14   Courtney Paris, MD  potassium chloride SA (K-DUR,KLOR-CON) 20 MEQ tablet Take 1 tablet (20 mEq total) by mouth 2 (two) times daily. Patient not taking: Reported on 12/20/2014 06/15/14   Devoria Albe, MD  warfarin (COUMADIN) 7.5 MG tablet Take 1 tablet (7.5 mg total) by mouth one time only at 6 PM. Patient not taking: Reported on 12/20/2014 03/30/14   Courtney Paris, MD    Inpatient Medications:  . aspirin EC  325 mg Oral Once  . clopidogrel  75 mg Oral Daily  . enoxaparin (LOVENOX) injection  80 mg Subcutaneous Q12H  . pantoprazole (PROTONIX) IV  40 mg Intravenous Q12H   . sodium chloride 75 mL/hr at 12/21/14 0215    Allergies: No Known Allergies  Social History   Social History  . Marital Status: Divorced    Spouse Name: N/A  . Number of Children: 2  . Years of Education: N/A   Occupational History  . Not on file.   Social History Main Topics   . Smoking status: Current Every Day Smoker -- 0.20 packs/day for 1 years    Types: Cigarettes  . Smokeless tobacco: Never Used     Comment: DOWN  TO 3 CIGARETTES  A DAY  . Alcohol Use: 0.0 oz/week    0 Cans of beer per week  . Drug Use: Yes    Special: Cocaine     Comment: hx of use x 1  . Sexual Activity: No   Other Topics Concern  . Not on file   Social History Narrative     Family History  Problem Relation Age of Onset  . Heart disease Brother     arrhythmia; died  . Breast cancer Maternal Aunt      Review of Systems: General: negative for chills, fever, night sweats or weight changes.  ENT: negative for rhinorrhea or epistaxis Cardiovascular: See history of present illness Dermatological: negative for rash Respiratory: negative for cough or wheezing GI: negative for nausea, vomiting, diarrhea, bright red blood per rectum, melena, or hematemesis. Positive for pill  dysphagia GU: no hematuria, urgency, or frequency Neurologic: negative for visual changes, syncope, headache, or dizziness Heme: no easy bruising or bleeding Endo: negative for excessive thirst, thyroid disorder, or flushing Musculoskeletal: negative for joint pain or swelling, negative for myalgias  All other systems reviewed and are otherwise negative except as noted above.  Physical Exam: Blood pressure 151/94, pulse 79, temperature 98.8 F (37.1 C), temperature source Oral, resp. rate 20, height 5\' 6"  (1.676 m), weight 158 lb 4.8 oz (71.804 kg), SpO2 95 %. Pt is alert and oriented, WD, WN, pleasant young woman in no distress. HEENT: normal Neck: JVP normal. Carotid upstrokes normal without bruits. No thyromegaly. Lungs: equal expansion, clear bilaterally CV: Apex is discrete and nondisplaced, RRR without murmur or gallop Abd: soft, NT, +BS, no bruit, no hepatosplenomegaly Back: no CVA tenderness Ext: no C/C/E        DP/PT pulses intact and = Skin: warm and dry without rash Neuro: CNII-XII  intact             Strength intact = bilaterally    Labs:  Recent Labs  12/21/14 0400  TROPONINI <0.03   Lab Results  Component Value Date   WBC 5.5 12/21/2014   HGB 9.3* 12/21/2014   HCT 28.7* 12/21/2014   MCV 93.2 12/21/2014   PLT 146* 12/21/2014    Recent Labs Lab 12/21/14 0400  NA 136  K 3.6  CL 102  CO2 22  BUN 9  CREATININE 0.63  CALCIUM 7.5*  PROT 5.6*  BILITOT 0.4  ALKPHOS 104  ALT 41  AST 80*  GLUCOSE 90   Lab Results  Component Value Date   CHOL 200 02/20/2014   HDL 82 02/20/2014   LDLCALC 80 02/20/2014   TRIG 189* 02/20/2014   Lab Results  Component Value Date   DDIMER 0.51* 03/15/2014    Radiology/Studies:  Dg Chest 2 View  12/20/2014   CLINICAL DATA:  Chest pain today  EXAM: CHEST  2 VIEW  COMPARISON:  05/11/2014  FINDINGS: Normal heart size. Clear lungs. No pneumothorax or pleural effusion.  IMPRESSION: No active cardiopulmonary disease.   Electronically Signed   By: Jolaine Click M.D.   On: 12/20/2014 19:26   Ct Angio Chest Pe W/cm &/or Wo Cm  12/20/2014   CLINICAL DATA:  Acute onset of mid to left-sided chest pain and shortness of breath. Initial encounter.  EXAM: CT ANGIOGRAPHY CHEST WITH CONTRAST  TECHNIQUE: Multidetector CT imaging of the chest was performed using the standard protocol during bolus administration of intravenous contrast. Multiplanar CT image reconstructions and MIPs were obtained to evaluate the vascular anatomy.  CONTRAST:  68mL OMNIPAQUE IOHEXOL 350 MG/ML SOLN  COMPARISON:  Chest radiograph performed earlier today at 6:56 p.m.  FINDINGS: There is no evidence of pulmonary embolus.  Minimal left-sided atelectasis is noted. The lungs are otherwise clear. There is no evidence of significant focal consolidation, pleural effusion or pneumothorax. No masses are identified; no abnormal focal contrast enhancement is seen.  A coronary artery stent is noted on the left side of the mediastinum. The mediastinum is unremarkable in  appearance. No mediastinal lymphadenopathy is seen. No pericardial effusion is identified. The great vessels are grossly unremarkable. No axillary lymphadenopathy is seen. The visualized portions of the thyroid gland are unremarkable in appearance.  The visualized portions of the liver and spleen are unremarkable. The visualized portions of the pancreas, gallbladder, stomach, adrenal glands and kidneys are within normal limits.  No acute osseous abnormalities are seen.  Review of the MIP images confirms the above findings.  IMPRESSION: 1. No evidence of pulmonary embolus. 2. Minimal left-sided atelectasis noted.  Lungs otherwise clear.   Electronically Signed   By: Roanna Raider M.D.   On: 12/20/2014 23:54   Dg Esophagus  12/21/2014   CLINICAL DATA:  Dysphagia.  EXAM: ESOPHOGRAM / BARIUM SWALLOW / BARIUM TABLET STUDY  TECHNIQUE: Combined double contrast and single contrast examination performed using effervescent crystals and thick barium liquid. The patient was observed with fluoroscopy swallowing a 13 mm barium sulphate tablet.  FLUOROSCOPY TIME:  Fluoroscopy Time:  0 minutes 37 seconds  COMPARISON:  CT scan of the chest dated 12/20/2014  FINDINGS: The patient experienced gagging with the effervescent crystals with the thick barium but she was was able to complete the exam.  The oropharyngeal swallowing mechanisms are normal. The mucosa and motility of the esophagus is normal. No hiatal hernia. No esophagitis or stricture or mass.  The patient initially had difficulty swallowing the 13 mm barium tablet at the oral stage. However, when she swallowed the tablet passed immediately from the mouth to the stomach with no delay.  IMPRESSION: The patient has oral stage difficulty with gagging but once the swallow is initiated the exam is normal.   Electronically Signed   By: Francene Boyers M.D.   On: 12/21/2014 09:43   EKG: Normal sinus rhythm with age-indeterminate anteroseptal infarct pattern, no acute ST/T-wave  changes  Cardiac Studies: 2D Echo 02/22/2014: Study Conclusions  - Left ventricle: Hypokinesis of the distal septal and inferior walls. No apical thrombus seen on the contrast study. The cavity size was normal. Systolic function was normal. The estimated ejection fraction was in the range of 50% to 55%. Wall motion was normal; there were no regional wall motion abnormalities. Left ventricular diastolic function parameters were normal. - Aortic root: The aortic root was normal in size. - Left atrium: The atrium was normal in size. - Right ventricle: The cavity size was normal. Wall thickness was normal. Systolic pressure was within the normal range. - Right atrium: The atrium was normal in size. - Atrial septum: A patent foramen ovale cannot be excluded. - Tricuspid valve: Structurally normal valve. There was trivial regurgitation. - Pulmonic valve: Structurally normal valve. There was no regurgitation. - Pulmonary arteries: Systolic pressure was within the normal range. - Inferior vena cava: The vessel was normal in size. - Pericardium, extracardiac: There was no pericardial effusion.  Impressions:  - Hypokinesis of the distal septal and inferior walls. No apical thrombus seen on the contrast study. Overal LVEF low normal estimated at 50-55%.  ASSESSMENT AND PLAN:  1. Chest pain, primarily atypical features 2. Known coronary artery disease with previous PCI (bare-metal stenting). The patient is maintained on long-term clopidogrel but has not been taking recently because of problems swallowing pills 3. Hypercoagulable disorder maintained on long-term anticoagulation 4. Nausea/vomiting/dysphagia  The patient has negative cardiac enzymes and an unremarkable EKG despite prolonged chest pain. I do not think her pain is cardiac in nature. However, with her multiple risk factors, hypercoagulable disorder, and previous stenting, it would be appropriate for further  risk stratification with a Myoview stress test. This can be done as an outpatient. I will arrange. Also will arrange follow-up with Dr. Jens Som or his PA/NP.  Enzo Bi MD, [facility] 12/21/2014, 9:55 AM

## 2014-12-21 NOTE — Progress Notes (Addendum)
ANTICOAGULATION CONSULT NOTE - Initial Consult  Pharmacy Consult for lovenox Indication: antithrombin 3 deficiency  No Known Allergies  Patient Measurements: Height: 5\' 6"  (167.6 cm) Weight: 158 lb 4.8 oz (71.804 kg) IBW/kg (Calculated) : 59.3 Heparin Dosing Weight:   Vital Signs: Temp: 98.7 F (37.1 C) (10/06 1200) Temp Source: Oral (10/06 1200) BP: 137/93 mmHg (10/06 1200) Pulse Rate: 71 (10/06 1200)  Labs:  Recent Labs  12/20/14 1837 12/21/14 0400 12/21/14 0950  HGB 11.2* 9.3*  --   HCT 35.4* 28.7*  --   PLT 208 146*  --   LABPROT 14.2  --   --   INR 1.08  --   --   CREATININE 0.64 0.63  --   TROPONINI  --  <0.03 <0.03    Estimated Creatinine Clearance: 89.2 mL/min (by C-G formula based on Cr of 0.63).   Medical History: Past Medical History  Diagnosis Date  . Stroke (HCC)     assoc with short term memory loss and right peripheral vision loss; age 44   . Blood transfusion   . Anxiety   . Coronary artery disease     Apical LAD infarction '00; NSTEMI s/p BMS to prox LAD '09; Cath 12/2011 single vessel CAD w/ patent LAD stent w/ stable mild ISR, nl LV systolic fxn  . Anemia   . Antithrombin III deficiency (HCC)     ?pt not sure if true diagnosis  . Tobacco abuse   . High blood pressure   . Myocardial infarction [facility])    Assessment: 44 yof presented to the ED with CP. She is on lovenox PTA for hx antithrombin III deficiency but she has been out for 3 or 4 days. Because of the difficulty swallowing her warfarin PTA she was switched to Lovenox a few weeks ago.   H/H slightly low but platelets are WNL on admit.  Baseline / admit INR is 1.08. Today Hgb dropped to 9.3 and PLTC down to 146. No bleeding noted. SCr 0.63 stable. Weight = 71.8kg  Goal of Therapy:  Anti-Xa level 0.6-1 units/ml 4hrs after LMWH dose given Monitor platelets by anticoagulation protocol: Yes   Plan:  - Decrease Lovenox to 70mg  SQ Q12H - CBC Q72H  Noah Delaine, RPh Clinical  Pharmacist Pager: [phone] 12/21/2014,2:17 PM

## 2014-12-21 NOTE — Progress Notes (Signed)
ANTICOAGULATION CONSULT NOTE - Initial Consult  Pharmacy Consult for lovenox Indication: antithrombin 3 deficiency  No Known Allergies  Patient Measurements:   Heparin Dosing Weight:   Vital Signs: Temp: 98.4 F (36.9 C) (10/05 1829) Temp Source: Oral (10/05 1829) BP: 117/85 mmHg (10/05 2230) Pulse Rate: 74 (10/05 2230)  Labs:  Recent Labs  12/20/14 1837  HGB 11.2*  HCT 35.4*  PLT 208  LABPROT 14.2  INR 1.08  CREATININE 0.64    CrCl cannot be calculated (Unknown ideal weight.).   Medical History: Past Medical History  Diagnosis Date  . Stroke (HCC)     assoc with short term memory loss and right peripheral vision loss; age 44   . Blood transfusion   . Anxiety   . Coronary artery disease     Apical LAD infarction '00; NSTEMI s/p BMS to prox LAD '09; Cath 12/2011 single vessel CAD w/ patent LAD stent w/ stable mild ISR, nl LV systolic fxn  . Anemia   . Antithrombin III deficiency (HCC)     ?pt not sure if true diagnosis  . Tobacco abuse   . High blood pressure   . Myocardial infarction [facility])    Assessment: 44 yof presented to the ED with CP. She is on lovenox PTA for hx antithrombin III deficiency but she has been out for 3 or 4 days. H/H slightly low but platelets are WNL. INR is 1.08.   Goal of Therapy:  Anti-Xa level 0.6-1 units/ml 4hrs after LMWH dose given Monitor platelets by anticoagulation protocol: Yes   Plan:  - Lovenox 80mg  SQ Q12H - CBC Q72H  Bienvenido Proehl, Drake Leach 12/21/2014,12:07 AM

## 2014-12-21 NOTE — Clinical Social Work Note (Signed)
Clinical Social Worker received standing order referral for [facility].  Chart reviewed.  Spoke with RN who will notify [facility] of patient wishes.    CSW signing off - please re consult if social work needs arise.  Macario Golds, [place] [phone]

## 2014-12-21 NOTE — Progress Notes (Signed)
Initial Nutrition Assessment  DOCUMENTATION CODES:   Not applicable  INTERVENTION:    Snacks TID between meals  NUTRITION DIAGNOSIS:   Inadequate oral intake related to altered GI function as evidenced by per patient/family report.  GOAL:   Patient will meet greater than or equal to 90% of their needs  MONITOR:   PO intake, Weight trends, I & O's  REASON FOR ASSESSMENT:   Malnutrition Screening Tool    ASSESSMENT:   44 year old African American lady with self-reported antithrombin III deficiency on lovenox, coronary artery disease with two myocardial infarctions status post bare metal stent placement in 2009, apical mural thrombus that was not present on an echo in 2015, transient ischemic attack in 2010, and hypertension who presents with chest pain.  Patient reports recent weight loss related to inability to keep food down, ~5% weight loss over the past 3 months. She tries to eat small amounts multiple times throughout the day at home. S/P SLP evaluation, diet advanced to soft with thin liquids. Discussed high protein snacks that should be easy to swallow. Patient agreeable to snacks between meals. Nutrition focused physical exam completed.  No muscle or subcutaneous fat depletion noticed.  Diet Order:  DIET SOFT Room service appropriate?: Yes; Fluid consistency:: Thin  Skin:  Reviewed, no issues  Last BM:  10/5  Height:   Ht Readings from Last 1 Encounters:  12/21/14 5\' 6"  (1.676 m)    Weight:   Wt Readings from Last 1 Encounters:  12/21/14 158 lb 4.8 oz (71.804 kg)    Ideal Body Weight:  59.1 kg  BMI:  Body mass index is 25.56 kg/(m^2).  Estimated Nutritional Needs:   Kcal:  1700-1900  Protein:  80-90 gm  Fluid:  1.8-2 L  EDUCATION NEEDS:   Education needs addressed  Joaquin Courts, RD, LDN, CNSC Pager [phone] After Hours Pager [phone]

## 2014-12-21 NOTE — H&P (Signed)
   Date: 12/21/2014  Patient name: Lindsey [name]  Medical record number: [id number]  Date of birth: [birth year]   I have seen and evaluated Lindsey [name] and discussed their care with the Residency Team.   Assessment and Plan: I have seen and evaluated the patient as outlined above. I agree with the formulated Assessment and Plan as detailed in the residents' admission note. Lindsey [name] has a history of anti-thrombin 3 deficiency and coronary artery disease. About one month ago she began to have problems swallowing some of her pills. She states each time she took her pills she would develop nausea and frequently vomit the pills back up. She's also had nausea and vomiting after eating food. She has changed her eating a soft diet but still has had problems with nausea and vomiting that occurs 3-4 times each day for the past month. Because of the difficulty swallowing her warfarin she was switched to Lovenox a few weeks ago. Yesterday morning she developed sharp substernal chest pain similar to what she had when she had her myocardial infarction. The pain was nonexertional and did not radiate. The pain was not affected by position or food. She continues to have pain overnight and this morning. She denies having any acid indigestion but did have some esophagitis noted on an EGD late last year. There is no evidence of an acute coronary syndrome by cardiac enzymes and EKGs. I suspect that she may have some reflux esophagitis causing her pain. We will treat her with a proton pump inhibitor and anti-medics. If she does not improve significantly in the next 2 weeks consider repeat GI evaluation.   Cliffton Asters, MD 10/6/201611:46 AM

## 2014-12-21 NOTE — Evaluation (Signed)
Clinical/Bedside Swallow Evaluation Patient Details  Name: Lindsey [name] MRN: [id number] Date of Birth: [birth year]  Today's Date: 12/21/2014 Time: SLP Start Time (ACUTE ONLY): 1120 SLP Stop Time (ACUTE ONLY): 1140 SLP Time Calculation (min) (ACUTE ONLY): 20 min  Past Medical History:  Past Medical History  Diagnosis Date  . Stroke (HCC)     assoc with short term memory loss and right peripheral vision loss; age 44   . Blood transfusion   . Anxiety   . Coronary artery disease     Apical LAD infarction '00; NSTEMI s/p BMS to prox LAD '09; Cath 12/2011 single vessel CAD w/ patent LAD stent w/ stable mild ISR, nl LV systolic fxn  . Anemia   . Antithrombin III deficiency (HCC)     ?pt not sure if true diagnosis  . Tobacco abuse   . High blood pressure   . Myocardial infarction [facility])    Past Surgical History:  Past Surgical History  Procedure Laterality Date  . Tubal ligation    . Cardiac catheterization    . Esophagogastroduodenoscopy N/A 06/09/2012    Procedure: ESOPHAGOGASTRODUODENOSCOPY (EGD);  Surgeon: Theda Belfast, MD;  Location: [facility];  Service: Endoscopy;  Laterality: N/A;  . Coronary angioplasty    . Esophagogastroduodenoscopy N/A 08/02/2013    Procedure: ESOPHAGOGASTRODUODENOSCOPY (EGD);  Surgeon: Meryl Dare, MD;  Location: [facility] ENDOSCOPY;  Service: Endoscopy;  Laterality: N/A;  . Left heart catheterization with coronary angiogram N/A 12/24/2011    Procedure: LEFT HEART CATHETERIZATION WITH CORONARY ANGIOGRAM;  Surgeon: Kathleene Hazel, MD;  Location: [facility] CATH LAB;  Service: Cardiovascular;  Laterality: N/A;  . Colonoscopy N/A 03/29/2014    Procedure: COLONOSCOPY;  Surgeon: Louis Meckel, MD;  Location: [facility] ENDOSCOPY;  Service: Endoscopy;  Laterality: N/A;   HPI:  Pt is a 44 yo female with self-reported antithrombin III deficiency on lovenox, coronary artery disease with two myocardial infarctions status post bare metal stent placement in 2009, apical mural  thrombus that was not present on an echo in 2015, transient ischemic attack in 2010, and hypertension who presented to [facility] on 10/5 with chest pain. Pt reported difficulty initiating swallow for food and pills for the past month. MD suspects oropharyngeal motility issue, obstructive issue v. UMN issue without CN deficit or change in voice. CXR unremarkable. Pt currently NPO.   Assessment / Plan / Recommendation Clinical Impression  Pt presents with typical oropharyngeal swallow function with no overt s/s of aspiration. Pt complained of globus sensation, throat clear, vomiting, and nausea which inhibits her eating. SLP educated pt, recommended swallowing pills (whole or crushed) with puree, drink lots of water, and try to limit throat clearing as that can irritate throat. SLP recommends regular solids and thin liquid diet and following up with GI. Will sign off, no f/u needed at this time.     Aspiration Risk  Mild    Diet Recommendation Thin;Age appropriate regular solids   Medication Administration: Whole meds with puree Compensations: Small sips/bites;Follow solids with liquid    Other  Recommendations Recommended Consults: Consider GI evaluation   Follow Up Recommendations       Frequency and Duration        Pertinent Vitals/Pain NA    SLP Swallow Goals     Swallow Study Prior Functional Status       General Other Pertinent Information: Pt is a 44 yo female with self-reported antithrombin III deficiency on lovenox, coronary artery disease with two myocardial infarctions status post bare  metal stent placement in 2009, apical mural thrombus that was not present on an echo in 2015, transient ischemic attack in 2010, and hypertension who presented to [facility] on 10/5 with chest pain. Pt reported difficulty initiating swallow for food and pills for the past month. MD suspects oropharyngeal motility issue, obstructive issue v. UMN issue without CN deficit or change in voice. CXR unremarkable. Pt  currently NPO. Type of Study: Bedside swallow evaluation Diet Prior to this Study: NPO Temperature Spikes Noted: No Respiratory Status: Room air History of Recent Intubation: No Behavior/Cognition: Alert;Cooperative;Pleasant mood Oral Cavity - Dentition: Adequate natural dentition/normal for age Self-Feeding Abilities: Able to feed self Patient Positioning: Upright in bed Baseline Vocal Quality: Normal Volitional Cough: Strong Volitional Swallow: Able to elicit    Oral/Motor/Sensory Function Overall Oral Motor/Sensory Function: Appears within functional limits for tasks assessed   Ice Chips Ice chips: Not tested   Thin Liquid Thin Liquid: Within functional limits    Nectar Thick Nectar Thick Liquid: Not tested   Honey Thick Honey Thick Liquid: Not tested   Puree Puree: Within functional limits   Solid   GO    Solid: Within functional limits      Riccardo Dubin, Student-SLP  Riccardo Dubin 12/21/2014,12:09 PM

## 2014-12-21 NOTE — Consult Note (Signed)
[facility] Consult: 3:24 PM 12/21/2014     Referring Provider: Dr Megan Salon Primary Care Physician:  Jenetta Downer, MD Primary Gastroenterologist:  Dr. Deatra Ina     Reason for Consultation:  Nausea and vomiting   HPI: Lindsey [name] is a 44 y.o. female.  Hx CVA.  Hx MI x 2.  CAD.  BMS to LAD 2009.  Chronic Coumadin and Plavix. Coagulopathy/Antithrombin 3 deficiency.  Fatty liver.  H pylori Ab IgG negative 05/2011.  Spinal disc disease, stenosis. Anemia, testing for celiac disease negative in 2009 and several EGDs/colonoscopies as below.   08/2014 CT abdomen for epigastric pain, N/V/D.  Diverticulosis, hepatic steatosis, fat containing umbilical hernia.  03/2014   Colonoscopy.  For hematochezia.  Dr Deatra Ina.   Internal hemorrhoids, ascending mild diverticulosis.  Given need for long term anti-coagulation, consider surgical referral for hemorrhoidectomy.   07/2013 EGD.  For hematemesis in setting of BCs powders, and inconsistent use of PPI.  Dr Fuller Plan.   LA grade B esophagitis, erosive gastritis, duodenitis, small HH.  05/2012  EGD.  For hematemesis.  Dr Benson Norway.   2 cm HH.  12/2007 Colonoscopy.  For intermittent rectal bleeding, IDA.  Dr Ardis Hughs.  Diffusely scattered tics, ascending to sigmoid colon. Ext rrhoids.    Dysphagia, to pills and solids, since at least late 08/2014.  Concomittant n/v, at least 2 to 3 times a day and 30 # weight loss.  Stools loose up to 2 x day but sometimes days pass without BMs,  Loose stools flared hemorrhoidal bleeding and pressure/pain but no major bleeding. At time of the dysphagia, N/V onset she had just been started on Zoloft for depression.  Sub lingual Zofran helps n/v a little bit.  The zoloft has made little change in her depression.  No abdominal pain.  No oral mucosa or dental  changes.  No ETOH or NSAIDs.  Compliant with daily Omeprazole.  Lovenox replaced Coumadin due to the pill dysphagia.  Diet changed to soft foods.  Admitted from ED 10/5.   Day PTA had non-exertional chest pain. Cardiac enzymes, EKG negative. Chest CT/angio 12/20/14: atx, no PE.  AST/ALT 80/41, normal T bili and alk phos (99/55 June 2016, t bili 1.21 August 2014), albumin 2.7 but o/w normal LFTs.  Hgb is 9.3, was 10.4 in July, 11.2 on admission.   Platelets 146. coags normal.  Continues with n/v/dysphagia.  SLP bedside speech eval 10/6: typical oropharyngeal swallow function with no overt s/s of aspiration Esophagram 10/6: gagging with "effervescent crystals" but swallowed thick barium and tablet without difficulty.  No dysmotility or esoph stricture.     Past Medical History  Diagnosis Date  . Stroke ([place])     assoc with short term memory loss and right peripheral vision loss; age 15   . Blood transfusion   . Anxiety   . Coronary artery disease     Apical LAD infarction '00; NSTEMI s/p BMS to prox LAD '09; Cath 12/2011 single vessel CAD w/ patent LAD stent w/ stable mild ISR, nl LV systolic fxn  . Anemia   .  Antithrombin III deficiency ([place])     ?pt not sure if true diagnosis  . Tobacco abuse   . High blood pressure   . Myocardial infarction [facility])     Past Surgical History  Procedure Laterality Date  . Tubal ligation    . Cardiac catheterization    . Esophagogastroduodenoscopy N/A 06/09/2012    Procedure: ESOPHAGOGASTRODUODENOSCOPY (EGD);  Surgeon: Beryle Beams, MD;  Location: [facility];  Service: Endoscopy;  Laterality: N/A;  . Coronary angioplasty    . Esophagogastroduodenoscopy N/A 08/02/2013    Procedure: ESOPHAGOGASTRODUODENOSCOPY (EGD);  Surgeon: Ladene Artist, MD;  Location: [facility] ENDOSCOPY;  Service: Endoscopy;  Laterality: N/A;  . Left heart catheterization with coronary angiogram N/A 12/24/2011    Procedure: LEFT HEART CATHETERIZATION WITH CORONARY ANGIOGRAM;  Surgeon:  Burnell Blanks, MD;  Location: [facility] CATH LAB;  Service: Cardiovascular;  Laterality: N/A;  . Colonoscopy N/A 03/29/2014    Procedure: COLONOSCOPY;  Surgeon: Inda Castle, MD;  Location: [facility];  Service: Endoscopy;  Laterality: N/A;    Prior to Admission medications   Medication Sig Start Date End Date Taking? Authorizing Provider  clopidogrel (PLAVIX) 75 MG tablet Take 75 mg by mouth daily.   Yes Historical Provider, MD  enoxaparin (LOVENOX) 150 MG/ML injection Inject 150 mg into the skin.   Yes Historical Provider, MD  hydrocortisone-pramoxine [facility]) rectal foam Place 1 applicator rectally 2 (two) times daily. 06/15/14  Yes Rolland Porter, MD  nitroGLYCERIN (NITROSTAT) 0.4 MG SL tablet Place 1 tablet (0.4 mg total) under the tongue every 5 (five) minutes as needed for chest pain (up to 3 doses). 10/14/13  Yes Scott T Kathlen Mody, PA-C  acetaminophen (TYLENOL) 325 MG tablet Take 2 tablets (650 mg total) by mouth every 6 (six) hours as needed for moderate pain, fever or headache. 02/21/14   Tasrif Ahmed, MD  atorvastatin (LIPITOR) 40 MG tablet Take 2 tablets (80 mg total) by mouth daily. Patient not taking: Reported on 12/20/2014 02/21/14 02/21/15  Dellia Nims, MD  HYDROcodone-acetaminophen (NORCO/VICODIN) 5-325 MG per tablet Take 2 tablets by mouth every 4 (four) hours as needed. Patient not taking: Reported on 12/20/2014 09/10/14   Alvina Chou, PA-C  hydrocortisone (ANUSOL-HC) 25 MG suppository Place 1 suppository (25 mg total) rectally 2 (two) times daily. Patient not taking: Reported on 05/12/2014 03/30/14   Alexa Sherral Hammers, MD  metoprolol succinate (TOPROL XL) 25 MG 24 hr tablet Take 1 tablet (25 mg total) by mouth daily. Patient not taking: Reported on 12/20/2014 03/03/14   Lelon Perla, MD  ondansetron (ZOFRAN ODT) 4 MG disintegrating tablet Take 1 tablet (4 mg total) by mouth every 8 (eight) hours as needed for nausea or vomiting. 09/10/14   Alvina Chou, PA-C    oxyCODONE-acetaminophen (ROXICET) 5-325 MG per tablet Take 1-2 tablets by mouth every 4 (four) hours as needed for severe pain. Patient not taking: Reported on 06/14/2014 03/30/14   Corky Sox, MD  pantoprazole (PROTONIX) 40 MG tablet Take 1 tablet (40 mg total) by mouth daily. Patient not taking: Reported on 12/20/2014 03/30/14   Corky Sox, MD  potassium chloride SA (K-DUR,KLOR-CON) 20 MEQ tablet Take 1 tablet (20 mEq total) by mouth 2 (two) times daily. Patient not taking: Reported on 12/20/2014 06/15/14   Rolland Porter, MD  warfarin (COUMADIN) 7.5 MG tablet Take 1 tablet (7.5 mg total) by mouth one time only at 6 PM. Patient not taking: Reported on 12/20/2014 03/30/14   Corky Sox, MD  Scheduled Meds: . aspirin EC  325 mg Oral Once  . clopidogrel  75 mg Oral Daily  . enoxaparin (LOVENOX) injection  1 mg/kg Subcutaneous Q12H  . pantoprazole (PROTONIX) IV  40 mg Intravenous Q12H  . sucralfate  1 g Oral TID WC & HS   Infusions:   PRN Meds: hydrocortisone, morphine injection, ondansetron (ZOFRAN) IV, witch hazel-glycerin   Allergies as of 12/20/2014  . (No Known Allergies)    Family History  Problem Relation Age of Onset  . Heart disease Brother     arrhythmia; died  . Breast cancer Maternal Aunt     Social History   Social History  . Marital Status: Divorced    Spouse Name: N/A  . Number of Children: 2  . Years of Education: N/A   Occupational History  . Not on file.   Social History Main Topics  . Smoking status: Current Every Day Smoker -- 0.20 packs/day for 1 years    Types: Cigarettes  . Smokeless tobacco: Never Used     Comment: DOWN  TO 3 CIGARETTES  A DAY  . Alcohol Use: 0.0 oz/week    0 Cans of beer per week  . Drug Use: Yes    Special: Cocaine     Comment: hx of use x 1  . Sexual Activity: No   Other Topics Concern  . Not on file   Social History Narrative    REVIEW OF SYSTEMS: Constitutional:  Per HPI.  Feels tired, weak alot ENT:  No nose  bleeds Pulm:  No SOB or cough CV:  No palpitations, no LE edema.  GU:  No hematuria, no frequency GI:  Per HPI Heme:  No excessive bleeding or bruising   Transfusions:  In 2005 for post partum bleeding.  Neuro:  No headaches, no peripheral tingling or numbness Derm:  No itching, no rash or sores.  Endocrine:  No sweats or chills.  No polyuria or dysuria Immunization:  Reviewed.  Travel:  None beyond local counties in last few months.    PHYSICAL EXAM: Vital signs in last 24 hours: Filed Vitals:   12/21/14 1200  BP: 137/93  Pulse: 71  Temp: 98.7 F (37.1 C)  Resp: 18   Wt Readings from Last 3 Encounters:  12/21/14 158 lb 4.8 oz (71.804 kg)  09/26/14 167 lb 9.6 oz (76.023 kg)  06/16/14 170 lb 11.2 oz (77.429 kg)    General: pleasant, comfortable, mildly ill looking AAF.  overweight Head:  No swelling or assymetry  Eyes:  No icterus or pallor Ears:  Not HOH  Nose:  No congestion or discharge Mouth:  Moist, clear, teeth healthy and intact Neck:  No mass, no JVD, no TMG Lungs:  Clear bil.  No SOB or cough. Heart: RRR.  No MRG.  S1/S2 audible Abdomen:  Soft, not tender or distended.  No bruits, no HSM, no masses.   Rectal: visible external hemorrhoids, no visible blood, no digital exam performed   Musc/Skeltl: no joint contracture or swelling Extremities:  No CCE  Neurologic:  Oriented x 3.  Fully alert and excellent historian.  No limb weakness or tremor Skin:  No rash or sores.  No telangectasia  Tattoos:  None seen Nodes:  No cervical adenopathy.    Psych:  Pleasant, not overtly depressed, cooperative and engaged. Calm.   Intake/Output from previous day:   Intake/Output this shift:    LAB RESULTS:  Recent Labs  12/20/14 1837 12/21/14 0400  WBC 6.7 5.5  HGB 11.2* 9.3*  HCT 35.4* 28.7*  PLT 208 146*   BMET Lab Results  Component Value Date   NA 136 12/21/2014   NA 139 12/20/2014   NA 145 09/26/2014   K 3.6 12/21/2014   K 3.9 12/20/2014   K 3.6  09/26/2014   CL 102 12/21/2014   CL 103 12/20/2014   CL 107 09/26/2014   CO2 22 12/21/2014   CO2 25 12/20/2014   CO2 26 09/26/2014   GLUCOSE 90 12/21/2014   GLUCOSE 108* 12/20/2014   GLUCOSE 89 09/26/2014   BUN 9 12/21/2014   BUN 8 12/20/2014   BUN 5* 09/26/2014   CREATININE 0.63 12/21/2014   CREATININE 0.64 12/20/2014   CREATININE 0.52 09/26/2014   CALCIUM 7.5* 12/21/2014   CALCIUM 8.1* 12/20/2014   CALCIUM 8.9 09/26/2014   LFT  Recent Labs  12/21/14 0400  PROT 5.6*  ALBUMIN 2.7*  AST 80*  ALT 41  ALKPHOS 104  BILITOT 0.4   PT/INR Lab Results  Component Value Date   INR 1.08 12/20/2014   INR 0.96 06/15/2014   INR 1.04 05/25/2014   Hepatitis Panel No results for input(s): HEPBSAG, HCVAB, HEPAIGM, HEPBIGM in the last 72 hours. C-Diff No components found for: CDIFF Lipase     Component Value Date/Time   LIPASE 17* 09/10/2014 1959    Drugs of Abuse     Component Value Date/Time   LABOPIA NONE DETECTED 03/26/2014 1755   COCAINSCRNUR NONE DETECTED 03/26/2014 1755   LABBENZ NONE DETECTED 03/26/2014 1755   AMPHETMU NONE DETECTED 03/26/2014 1755   THCU NONE DETECTED 03/26/2014 1755   LABBARB NONE DETECTED 03/26/2014 1755     RADIOLOGY STUDIES: Dg Chest 2 View  12/20/2014   CLINICAL DATA:  Chest pain today  EXAM: CHEST  2 VIEW  COMPARISON:  05/11/2014  FINDINGS: Normal heart size. Clear lungs. No pneumothorax or pleural effusion.  IMPRESSION: No active cardiopulmonary disease.   Electronically Signed   By: Marybelle Killings M.D.   On: 12/20/2014 19:26   Ct Angio Chest Pe W/cm &/or Wo Cm  12/20/2014   CLINICAL DATA:  Acute onset of mid to left-sided chest pain and shortness of breath. Initial encounter.  EXAM: CT ANGIOGRAPHY CHEST WITH CONTRAST  TECHNIQUE: Multidetector CT imaging of the chest was performed using the standard protocol during bolus administration of intravenous contrast. Multiplanar CT image reconstructions and MIPs were obtained to evaluate the  vascular anatomy.  CONTRAST:  84m OMNIPAQUE IOHEXOL 350 MG/ML SOLN  COMPARISON:  Chest radiograph performed earlier today at 6:56 p.m.  FINDINGS: There is no evidence of pulmonary embolus.  Minimal left-sided atelectasis is noted. The lungs are otherwise clear. There is no evidence of significant focal consolidation, pleural effusion or pneumothorax. No masses are identified; no abnormal focal contrast enhancement is seen.  A coronary artery stent is noted on the left side of the mediastinum. The mediastinum is unremarkable in appearance. No mediastinal lymphadenopathy is seen. No pericardial effusion is identified. The great vessels are grossly unremarkable. No axillary lymphadenopathy is seen. The visualized portions of the thyroid gland are unremarkable in appearance.  The visualized portions of the liver and spleen are unremarkable. The visualized portions of the pancreas, gallbladder, stomach, adrenal glands and kidneys are within normal limits.  No acute osseous abnormalities are seen.  Review of the MIP images confirms the above findings.  IMPRESSION: 1. No evidence of pulmonary embolus. 2. Minimal left-sided atelectasis noted.  Lungs otherwise clear.   Electronically Signed  By: Garald Balding M.D.   On: 12/20/2014 23:54   Dg Esophagus  12/21/2014   CLINICAL DATA:  Dysphagia.  EXAM: ESOPHOGRAM / BARIUM SWALLOW / BARIUM TABLET STUDY  TECHNIQUE: Combined double contrast and single contrast examination performed using effervescent crystals and thick barium liquid. The patient was observed with fluoroscopy swallowing a 13 mm barium sulphate tablet.  FLUOROSCOPY TIME:  Fluoroscopy Time:  0 minutes 37 seconds  COMPARISON:  CT scan of the chest dated 12/20/2014  FINDINGS: The patient experienced gagging with the effervescent crystals with the thick barium but she was was able to complete the exam.  The oropharyngeal swallowing mechanisms are normal. The mucosa and motility of the esophagus is normal. No hiatal  hernia. No esophagitis or stricture or mass.  The patient initially had difficulty swallowing the 13 mm barium tablet at the oral stage. However, when she swallowed the tablet passed immediately from the mouth to the stomach with no delay.  IMPRESSION: The patient has oral stage difficulty with gagging but once the swallow is initiated the exam is normal.   Electronically Signed   By: Lorriane Shire M.D.   On: 12/21/2014 09:43    ENDOSCOPIC STUDIES: See HPI  IMPRESSION:   *  Dysphagia, n/v.  sxs onset corresponds with starting on Zoloft so ? If we are seeing med s/e?  Esophagram: no overt dysphagia other than some gagging on oral phase Small HH, esophagogastroduodenitis on previous EGDs.   *  Chronic anticoagulation for hypercoagulable/antithrombin 3 deficiency.  Also on chronic Plavix (Hx CVA, MI, stenting to LAD. Due to dysphagia, Lovenox replaced coumadin in recent weeks   *  Mild rectal bleeding, likely hemorrhoidal.  Anusol HC per rectum initiated this admission.   *  Normocytic anemia.  Long hx IDA, dating to at least 2009.    *  Thrombocytopenia.  Acute, non-critical. Intermittent since 2015. .   *   Fatty liver on CT scan. Mild transaminase elevation in 08/2014, AST remains 2x normal. Lipase normal.  No abdominal pain.   *  Urinary analysis + for trichominiosis.  IV Flagyl x 1. Marland Kitchen     PLAN:     *  Strongly consider discontinuing Zoloft, pt says it has not made any difference in her mood/depression and would have no trouble stopping it.   *  ? EGD.  If done, would be best if pt held Plavix for 5 days beforehand.  Suspect it would be unrevealing *  Will get abdominal ultrasound in AM.    Azucena Freed  12/21/2014, 3:24 PM Pager: [phone]

## 2014-12-21 NOTE — Progress Notes (Signed)
Responded to request to assist patient with AD. Chaplain reviewed document with patient and left form with her.  Patient indicated that she will complete and get back too [place] when ready. Will follow as needed.

## 2014-12-22 ENCOUNTER — Telehealth (HOSPITAL_COMMUNITY): Payer: Self-pay | Admitting: Radiology

## 2014-12-22 DIAGNOSIS — R110 Nausea: Secondary | ICD-10-CM

## 2014-12-22 LAB — CBC
HCT: 27.6 % — ABNORMAL LOW (ref 36.0–46.0)
HEMOGLOBIN: 9 g/dL — AB (ref 12.0–15.0)
MCH: 30.1 pg (ref 26.0–34.0)
MCHC: 32.6 g/dL (ref 30.0–36.0)
MCV: 92.3 fL (ref 78.0–100.0)
Platelets: 140 10*3/uL — ABNORMAL LOW (ref 150–400)
RBC: 2.99 MIL/uL — AB (ref 3.87–5.11)
RDW: 17.4 % — ABNORMAL HIGH (ref 11.5–15.5)
WBC: 4.7 10*3/uL (ref 4.0–10.5)

## 2014-12-22 MED ORDER — FAMOTIDINE 40 MG/5ML PO SUSR: 20.0000 mg | Freq: Every day | ORAL | Status: DC

## 2014-12-22 MED ORDER — ONDANSETRON HCL 4 MG/5ML PO SOLN
4.0000 mg | Freq: Four times a day (QID) | ORAL | Status: DC
  Administered 2014-12-22: 4 mg via ORAL
  Filled 2014-12-22 (×4): qty 5

## 2014-12-22 MED ORDER — PANTOPRAZOLE SODIUM 40 MG PO PACK: 40.0000 mg | PACK | Freq: Every day | ORAL | Status: DC

## 2014-12-22 MED ORDER — PANTOPRAZOLE SODIUM 40 MG PO PACK
40.0000 mg | PACK | Freq: Every day | ORAL | Status: DC
  Filled 2014-12-22: qty 20

## 2014-12-22 MED ORDER — SUCRALFATE 1 GM/10ML PO SUSP: 1.0000 g | Freq: Three times a day (TID) | ORAL | Status: DC

## 2014-12-22 NOTE — Discharge Instructions (Signed)

## 2014-12-22 NOTE — Progress Notes (Signed)
Daily Rounding Note  12/22/2014, 9:17 AM    SUBJECTIVE:       Swallowing better but still difficulty with pills.  Nausea better, tolerated chicken soup last night but vomited up same this AM.   Soft, formed stool this AM.    OBJECTIVE:         Vital signs in last 24 hours:    Temp:  [98.3 F (36.8 C)-98.7 F (37.1 C)] 98.4 F (36.9 C) (10/07 0806) Pulse Rate:  [71-83] 73 (10/07 0806) Resp:  [16-20] 20 (10/07 0806) BP: (137-164)/(90-102) 145/96 mmHg (10/07 0806) SpO2:  [98 %-100 %] 98 % (10/07 0806) Weight:  [160 lb 8 oz (72.802 kg)] 160 lb 8 oz (72.802 kg) (10/07 0400) Last BM Date: 12/21/14 Filed Weights   12/21/14 0140 12/22/14 0400  Weight: 158 lb 4.8 oz (71.804 kg) 160 lb 8 oz (72.802 kg)   General: looks slightly unwell.  comfortable   Heart: RRR Chest: clear bil.  No cough or dyspnea Abdomen: soft, NT, ND.  BS active.   Extremities: no CCE Neuro/Psych:  Pleasant, fully alert and oriented.  No gross deficits.   Intake/Output from previous day:    Intake/Output this shift:    Lab Results:  Recent Labs  12/20/14 1837 12/21/14 0400 12/22/14 0434  WBC 6.7 5.5 4.7  HGB 11.2* 9.3* 9.0*  HCT 35.4* 28.7* 27.6*  PLT 208 146* 140*   BMET  Recent Labs  12/20/14 1837 12/21/14 0400  NA 139 136  K 3.9 3.6  CL 103 102  CO2 25 22  GLUCOSE 108* 90  BUN 8 9  CREATININE 0.64 0.63  CALCIUM 8.1* 7.5*   LFT  Recent Labs  12/21/14 0400  PROT 5.6*  ALBUMIN 2.7*  AST 80*  ALT 41  ALKPHOS 104  BILITOT 0.4   PT/INR  Recent Labs  12/20/14 1837  LABPROT 14.2  INR 1.08   Hepatitis Panel No results for input(s): HEPBSAG, HCVAB, HEPAIGM, HEPBIGM in the last 72 hours.  Studies/Results: Dg Chest 2 View  12/20/2014   CLINICAL DATA:  Chest pain today  EXAM: CHEST  2 VIEW  COMPARISON:  05/11/2014  FINDINGS: Normal heart size. Clear lungs. No pneumothorax or pleural effusion.  IMPRESSION: No active  cardiopulmonary disease.   Electronically Signed   By: Jolaine Click M.D.   On: 12/20/2014 19:26   Ct Angio Chest Pe W/cm &/or Wo Cm  12/20/2014   CLINICAL DATA:  Acute onset of mid to left-sided chest pain and shortness of breath. Initial encounter.  EXAM: CT ANGIOGRAPHY CHEST WITH CONTRAST  TECHNIQUE: Multidetector CT imaging of the chest was performed using the standard protocol during bolus administration of intravenous contrast. Multiplanar CT image reconstructions and MIPs were obtained to evaluate the vascular anatomy.  CONTRAST:  42mL OMNIPAQUE IOHEXOL 350 MG/ML SOLN  COMPARISON:  Chest radiograph performed earlier today at 6:56 p.m.  FINDINGS: There is no evidence of pulmonary embolus.  Minimal left-sided atelectasis is noted. The lungs are otherwise clear. There is no evidence of significant focal consolidation, pleural effusion or pneumothorax. No masses are identified; no abnormal focal contrast enhancement is seen.  A coronary artery stent is noted on the left side of the mediastinum. The mediastinum is unremarkable in appearance. No mediastinal lymphadenopathy is seen. No pericardial effusion is identified. The great vessels are grossly unremarkable. No axillary lymphadenopathy is seen. The visualized portions of the thyroid gland are unremarkable in appearance.  The  visualized portions of the liver and spleen are unremarkable. The visualized portions of the pancreas, gallbladder, stomach, adrenal glands and kidneys are within normal limits.  No acute osseous abnormalities are seen.  Review of the MIP images confirms the above findings.  IMPRESSION: 1. No evidence of pulmonary embolus. 2. Minimal left-sided atelectasis noted.  Lungs otherwise clear.   Electronically Signed   By: Roanna Raider M.D.   On: 12/20/2014 23:54   Dg Esophagus  12/21/2014   CLINICAL DATA:  Dysphagia.  EXAM: ESOPHOGRAM / BARIUM SWALLOW / BARIUM TABLET STUDY  TECHNIQUE: Combined double contrast and single contrast  examination performed using effervescent crystals and thick barium liquid. The patient was observed with fluoroscopy swallowing a 13 mm barium sulphate tablet.  FLUOROSCOPY TIME:  Fluoroscopy Time:  0 minutes 37 seconds  COMPARISON:  CT scan of the chest dated 12/20/2014  FINDINGS: The patient experienced gagging with the effervescent crystals with the thick barium but she was was able to complete the exam.  The oropharyngeal swallowing mechanisms are normal. The mucosa and motility of the esophagus is normal. No hiatal hernia. No esophagitis or stricture or mass.  The patient initially had difficulty swallowing the 13 mm barium tablet at the oral stage. However, when she swallowed the tablet passed immediately from the mouth to the stomach with no delay.  IMPRESSION: The patient has oral stage difficulty with gagging but once the swallow is initiated the exam is normal.   Electronically Signed   By: Francene Boyers M.D.   On: 12/21/2014 09:43   US Abdomen Limited  12/21/2014   CLINICAL DATA:  Nausea and elevated liver function tests.  EXAM: US ABDOMEN LIMITED - RIGHT UPPER QUADRANT  COMPARISON:  06/06/2011  FINDINGS: Gallbladder:  No gallstones or wall thickening visualized. No sonographic Murphy sign noted.  Common bile duct:  Diameter: 4 mm  Liver:  No focal lesion identified. Within normal limits in parenchymal echogenicity.  IMPRESSION: No acute abnormality.   Electronically Signed   By: Jolaine Click M.D.   On: 12/21/2014 23:11   Scheduled Meds: . aspirin EC  325 mg Oral Once  . clopidogrel  75 mg Oral Daily  . enoxaparin (LOVENOX) injection  1 mg/kg Subcutaneous Q12H  . pantoprazole (PROTONIX) IV  40 mg Intravenous Q12H  . sucralfate  1 g Oral TID WC & HS   Continuous Infusions:  PRN Meds:.acetaminophen, hydrocortisone, morphine injection, ondansetron (ZOFRAN) IV, witch hazel-glycerin  ASSESMENT:   * difficulty swallowing pills, nausea/vomiting. Sxs onset corresponds with starting on Zoloft  so ? If we are seeing med s/e? Zoloft now discontinued (was not listed on home med list so was not contd during this admission). Note qid Carafate added.  Esophagram: no overt dysphagia other than some gagging on oral phase Small HH, esophagogastroduodenitis on previous EGDs.   * Chronic anticoagulation for hypercoagulable/antithrombin 3 deficiency. Also on chronic Plavix (Hx CVA, MI, stenting to LAD. Due to dysphagia, Lovenox replaced coumadin in recent weeks   * Mild rectal bleeding, likely hemorrhoidal. Anusol HC per rectum initiated this admission. hemorroids noted on 03/2014 colonoscopy.  Does not need repeat study.   * Normocytic anemia. Long hx IDA, dating to at least 2009.   * Thrombocytopenia. Acute, non-critical. Intermittent since 2015. .   * Fatty liver on CT scan. Mild transaminase elevation in 08/2014, AST remains 2x normal. Lipase normal. No abdominal pain.   * Urinary analysis + for trichominiosis.treated with single dose IV Flagyl.    PLAN   *  Continue to observe off Zoloft.  Continue PPI, switched her from IV to oral suspension.   *  Will place her on scheduled Zofran with prn as needed.   *  Consider gastric emptying study if n/v do not subside.   *  Carafate can interfere with absorption of other oral meds, so timing of taking Carafate is problematic. Would rec this for short term use only.       Jennye Moccasin  12/22/2014, 9:17 AM Pager: [phone]

## 2014-12-22 NOTE — Telephone Encounter (Signed)
Encounter complete. 

## 2014-12-22 NOTE — Discharge Summary (Signed)
Name: Lindsey [name] MRN: [id number] DOB: 1971-01-30 44 y.o. PCP: Onnie Boer, MD  Date of Admission: 12/20/2014  6:53 PM Date of Discharge: 12/22/2014 Attending Physician: No att. providers found  Discharge Diagnosis: Principal Problem:   Nausea without vomiting Active Problems:   Chest pain   Difficulty swallowing pills   History of ischemic left PCA stroke   Hemorrhoids   Essential hypertension   Coronary Artery Disease   Antithrombin III deficiency (HCC)   Long term current use of anticoagulant therapy   Depression   Trichomonas infection  Discharge Medications:   Medication List    STOP taking these medications        acetaminophen 325 MG tablet  Commonly known as:  TYLENOL     HYDROcodone-acetaminophen 5-325 MG tablet  Commonly known as:  NORCO/VICODIN     oxyCODONE-acetaminophen 5-325 MG tablet  Commonly known as:  ROXICET     pantoprazole 40 MG tablet  Commonly known as:  PROTONIX  Replaced by:  pantoprazole sodium 40 mg/20 mL Pack     potassium chloride SA 20 MEQ tablet  Commonly known as:  K-DUR,KLOR-CON     warfarin 7.5 MG tablet  Commonly known as:  COUMADIN      TAKE these medications        atorvastatin 40 MG tablet  Commonly known as:  LIPITOR  Take 2 tablets (80 mg total) by mouth daily.     clopidogrel 75 MG tablet  Commonly known as:  PLAVIX  Take 75 mg by mouth daily.     famotidine 40 MG/5ML suspension  Commonly known as:  PEPCID  Take 2.5 mLs (20 mg total) by mouth daily.     hydrocortisone 25 MG suppository  Commonly known as:  ANUSOL-HC  Place 1 suppository (25 mg total) rectally 2 (two) times daily.     hydrocortisone-pramoxine rectal foam  Commonly known as:  PROCTOFOAM-HC  Place 1 applicator rectally 2 (two) times daily.     LOVENOX 150 MG/ML injection  Generic drug:  enoxaparin  Inject 150 mg into the skin.     metoprolol succinate 25 MG 24 hr tablet  Commonly known as:  TOPROL XL  Take 1 tablet (25 mg  total) by mouth daily.     nitroGLYCERIN 0.4 MG SL tablet  Commonly known as:  NITROSTAT  Place 1 tablet (0.4 mg total) under the tongue every 5 (five) minutes as needed for chest pain (up to 3 doses).     ondansetron 4 MG disintegrating tablet  Commonly known as:  ZOFRAN ODT  Take 1 tablet (4 mg total) by mouth every 8 (eight) hours as needed for nausea or vomiting.     pantoprazole sodium 40 mg/20 mL Pack  Commonly known as:  PROTONIX  Place 20 mLs (40 mg total) into feeding tube daily.     sucralfate 1 GM/10ML suspension  Commonly known as:  CARAFATE  Take 10 mLs (1 g total) by mouth 4 (four) times daily -  with meals and at bedtime.        Disposition and follow-up:   Ms.Hasel W Mastrangelo was discharged from [facility] in Good condition.  At the hospital follow up visit please address:  1.  Ability to swallow pills/food and nausea: Please assess patient's ability to take her medication. She had stopped much of her medication prior to admission due to difficulty swallowing her pills and nausea. We have started her on suspension of Famotidine vs  Pantoprazole (she should fill whichever is more affordable for her) and continued sublingual Zofran which have alleviated her symptoms. Started carafate for short term use as well.  Lovenox vs Coumadin: Patient was switched from Coumadin to Lovenox prior to hospital visit due to inability to swallow coumadin pills. If she feels able, she may be able to crush coumadin and mix with applesauce to make it easier for her to take and avoid lovenox shots.  Depression: Patient states she was previously on Zoloft which she stopped abruptly 1-2 months prior to admission because it was not working. Doubt her symptoms at this time were due to withdrawal as they usually subside 1-2 weeks after discontinuation, but may need further assessment of current depressive symptoms and if medical management is indicated.  Trichomonas: Patient  positive for trichomoniasis and received 2 grams IV Metronidazole once. Will need repeat testing between 2 weeks and 3 months from treatment date (12/21/14). Patient also advised that her partner will need to be treated as well.  Alcohol use: Just prior to discharge, patient's husband insinuated that she has history of alcohol use (though she denied much use). Her AST/ALT was 80:41, and he says she was previously started on Antabuse but did not take it. Alcohol use may be contributive to her symptoms and may need reassessment and counseling.  2.  Labs / imaging needed at time of follow-up: CBC  3.  Pending labs/ test needing follow-up: none  Follow-up Appointments: Follow-up Information    Follow up with [name], MD On 12/26/2014.   Specialty:  Internal Medicine   Why:  2:45 PM   Contact information:   [address] [place] [address] [phone]       Follow up with Marsa Aris, MD On 02/15/2015.   Why:  9:AM for follow-up of your nausea, vomiting, and trouble swallowing   Contact information:   [address] [place] [address] [phone]       Follow up with [facility] On 12/27/2014.   Why:  @1 :00 pm for outpatient Myoview   Contact information:   [address] [place] [address] [phone]      Follow up with Abelino Derrick, PA-C. Go on 01/10/2015.   Specialties:  Cardiology, Radiology   Why:  @2 :30 for cardiology f/u   Contact information:   [address] [place] [address] [phone]       Discharge Instructions: Discharge Instructions    Call MD for:  difficulty breathing, headache or visual disturbances    Complete by:  As directed      Call MD for:  persistant dizziness or light-headedness    Complete by:  As directed      Call MD for:  persistant nausea and vomiting    Complete by:  As directed      Call MD for:  severe uncontrolled pain    Complete by:  As directed       Diet - low sodium heart healthy    Complete by:  As directed      Increase activity slowly    Complete by:  As directed            Consultations: Treatment Team:  [facility], MD Louis Meckel, MD  Procedures Performed:  Dg Chest 2 View  12/20/2014   CLINICAL DATA:  Chest pain today  EXAM: CHEST  2 VIEW  COMPARISON:  05/11/2014  FINDINGS: Normal heart size. Clear lungs. No pneumothorax  or pleural effusion.  IMPRESSION: No active cardiopulmonary disease.   Electronically Signed   By: Jolaine Click M.D.   On: 12/20/2014 19:26   Ct Angio Chest Pe W/cm &/or Wo Cm  12/20/2014   CLINICAL DATA:  Acute onset of mid to left-sided chest pain and shortness of breath. Initial encounter.  EXAM: CT ANGIOGRAPHY CHEST WITH CONTRAST  TECHNIQUE: Multidetector CT imaging of the chest was performed using the standard protocol during bolus administration of intravenous contrast. Multiplanar CT image reconstructions and MIPs were obtained to evaluate the vascular anatomy.  CONTRAST:  80mL OMNIPAQUE IOHEXOL 350 MG/ML SOLN  COMPARISON:  Chest radiograph performed earlier today at 6:56 p.m.  FINDINGS: There is no evidence of pulmonary embolus.  Minimal left-sided atelectasis is noted. The lungs are otherwise clear. There is no evidence of significant focal consolidation, pleural effusion or pneumothorax. No masses are identified; no abnormal focal contrast enhancement is seen.  A coronary artery stent is noted on the left side of the mediastinum. The mediastinum is unremarkable in appearance. No mediastinal lymphadenopathy is seen. No pericardial effusion is identified. The great vessels are grossly unremarkable. No axillary lymphadenopathy is seen. The visualized portions of the thyroid gland are unremarkable in appearance.  The visualized portions of the liver and spleen are unremarkable. The visualized portions of the pancreas, gallbladder, stomach, adrenal glands and kidneys are within normal limits.  No  acute osseous abnormalities are seen.  Review of the MIP images confirms the above findings.  IMPRESSION: 1. No evidence of pulmonary embolus. 2. Minimal left-sided atelectasis noted.  Lungs otherwise clear.   Electronically Signed   By: Roanna Raider M.D.   On: 12/20/2014 23:54   Dg Esophagus  12/21/2014   CLINICAL DATA:  Dysphagia.  EXAM: ESOPHOGRAM / BARIUM SWALLOW / BARIUM TABLET STUDY  TECHNIQUE: Combined double contrast and single contrast examination performed using effervescent crystals and thick barium liquid. The patient was observed with fluoroscopy swallowing a 13 mm barium sulphate tablet.  FLUOROSCOPY TIME:  Fluoroscopy Time:  0 minutes 37 seconds  COMPARISON:  CT scan of the chest dated 12/20/2014  FINDINGS: The patient experienced gagging with the effervescent crystals with the thick barium but she was was able to complete the exam.  The oropharyngeal swallowing mechanisms are normal. The mucosa and motility of the esophagus is normal. No hiatal hernia. No esophagitis or stricture or mass.  The patient initially had difficulty swallowing the 13 mm barium tablet at the oral stage. However, when she swallowed the tablet passed immediately from the mouth to the stomach with no delay.  IMPRESSION: The patient has oral stage difficulty with gagging but once the swallow is initiated the exam is normal.   Electronically Signed   By: Francene Boyers M.D.   On: 12/21/2014 09:43   US Abdomen Limited  12/21/2014   CLINICAL DATA:  Nausea and elevated liver function tests.  EXAM: US ABDOMEN LIMITED - RIGHT UPPER QUADRANT  COMPARISON:  06/06/2011  FINDINGS: Gallbladder:  No gallstones or wall thickening visualized. No sonographic Murphy sign noted.  Common bile duct:  Diameter: 4 mm  Liver:  No focal lesion identified. Within normal limits in parenchymal echogenicity.  IMPRESSION: No acute abnormality.   Electronically Signed   By: Jolaine Click M.D.   On: 12/21/2014 23:11    2D Echo: 02/22/14 -  Hypokinesis of the distal septal and inferior walls. No apical thrombus seen on the contrast study. Overal LVEF low normal estimated at 50-55%.  Cardiac  Cath: 12/24/11 1. Single vessel CAD with patent stent in the proximal LAD with stable mild in-stent restenosis.  2. Segmental wall motion abnormality with preserved LV systolic function that is stable over the last 3 years.  3. Non-cardiac chest pain  Admission HPI: Ms. Asti is a 44 year old African American lady with self-reported antithrombin III deficiency on lovenox, coronary artery disease with two myocardial infarctions status post bare metal stent placement in 2009, apical mural thrombus that was not present on an echo in 2015, transient ischemic attack in 2010, and hypertension who presents with chest pain. Her pain began acutely this afternoon while watching television; the pain was 8/10, substernal, stabbing, associated with diaphoresis, and was unrelieved with one sublingual nitroglycerin. She said this felt like her previous heart attack in 2009 so she decided to come to the hospital. The pain was non-exertional, without a pleuritic component, not radiating to her back or arm, and not associated with a change in position or related to eating. Notably, she has also had trouble initiating swallowing for the last month or so and has not been able to swallow pills for the past 3 weeks. Her coumadin was changed to lovenox two weeks ago for this reason, but she has been unable to take her protonix during this time. She also complains of hemorrhoids that have thrombosed and are sticking out of her anus. She has been using preparation H that hasn't helped much and would like to talk to a surgeon about removal.  In the emergency department, her vital signs were normal, EKG showed sinus rhythm without ischemic changes or changes from prior EKG, two point-of-care troponins were normal, chest x-ray was unremarkable, and CT chest angiography was  negative for pulmonary embolus.  Hospital Course by problem list:   Nausea without vomiting & Difficulty swallowing pills: Patient's symptoms of nausea and difficulty swallowing pills have been occuring for the last 2-3 months. She has had issues with keeping food down. Esophagram showed trouble initiating swallow process, but otherwise normal swallow study. Speech evaluation recommended whole meds with puree. She was started on IV PPI, continued sublingual Zofran, and added carafate. GI was consulted with assessment that she just had difficulty swallowing her pills, not a true dysphagia. RUQ [place] was normal. Switched IV PPI to oral suspension. Her nausea and stomach pain improved with oral suspension PPI, sublingual Zofran, and Carafate.    Chest pain: Patient presented with substernal, non-radiating chest pain that was not associated with diaphoresis, numbness, or tingling. There was concern for ACS as patient has history of MI, but workup was unremarkable with negative troponins, no ischemic or concerning changes on EKG, unremarkable CT angio chest and CXR. Chest pain resolved with PPI and carafate.   Trichomonas infection: IV Metronidazole 2 grams once     Discharge Vitals:   BP 133/87 mmHg  Pulse 82  Temp(Src) 98.5 F (36.9 C) (Oral)  Resp 16  Ht 5\' 6"  (1.676 m)  Wt 160 lb 8 oz (72.802 kg)  BMI 25.92 kg/m2  SpO2 100%  Discharge Labs:  Results for orders placed or performed during the hospital encounter of 12/20/14 (from the past 24 hour(s))  CBC     Status: Abnormal   Collection Time: 12/22/14  4:34 AM  Result Value Ref Range   WBC 4.7 4.0 - 10.5 K/uL   RBC 2.99 (L) 3.87 - 5.11 MIL/uL   Hemoglobin 9.0 (L) 12.0 - 15.0 g/dL   HCT 10.3 (L) 15.9 - 45.8 %  MCV 92.3 78.0 - 100.0 fL   MCH 30.1 26.0 - 34.0 pg   MCHC 32.6 30.0 - 36.0 g/dL   RDW 16.1 (H) 09.6 - 04.5 %   Platelets 140 (L) 150 - 400 K/uL    Signed: Darreld Mclean, MD 12/22/2014, 3:15 PM    Services Ordered on  Discharge: none Equipment Ordered on Discharge: none

## 2014-12-22 NOTE — Progress Notes (Signed)
Subjective: Patient states her nausea is somewhat improved. Was able to eat chicken noodle soup last night and able to keep it down. Complains of some stomach pain which has resolved. On a soft food diet, wasn't able to hold the initial few bites but otherwise kept the small amount she ate down. Objective: Vital signs in last 24 hours: Filed Vitals:   12/22/14 0040 12/22/14 0400 12/22/14 0806 12/22/14 1126  BP: 147/90 158/100 145/96 133/87  Pulse: 78 79 73 82  Temp: 98.3 F (36.8 C) 98.4 F (36.9 C) 98.4 F (36.9 C) 98.5 F (36.9 C)  TempSrc: Oral Oral Oral Oral  Resp: 16 20 20 16   Height:      Weight:  160 lb 8 oz (72.802 kg)    SpO2: 100% 100% 98% 100%   Weight change: 2 lb 3.2 oz (0.998 kg)  Intake/Output Summary (Last 24 hours) at 12/22/14 1200 Last data filed at 12/22/14 1127  Gross per 24 hour  Intake    120 ml  Output      0 ml  Net    120 ml   General: resting in bed, no acute distress Cardiac: RRR, no rubs, murmurs or gallops Pulm: clear to auscultation bilaterally, moving normal volumes of air Abd: soft, nontender, nondistended, BS present Ext: warm and well perfused, no pedal edema Neuro: alert and oriented X3  Assessment/Plan: Principal Problem:   Nausea without vomiting Active Problems:   Chest pain   Difficulty swallowing pills   History of ischemic left PCA stroke   Hemorrhoids   Essential hypertension   Coronary Artery Disease   Antithrombin III deficiency (HCC)   Long term current use of anticoagulant therapy   Depression   Trichomonas infection  Chest pain: Patient presented with substernal, non-radiating chest pain that was not associated with diaphoresis, numbness, or tingling. There was concern for ACS as patient has history of MI, but workup was negative. Her pain was likely secondary to esophagitis from vomiting. She had previous EGD which did show prior esophagitis and esophagram only showed difficulty with initiation of swallowing phase.  Her pain is resolved today with PPIs, suggesting likely secondary to esophagitis from vomiting or reflux. -Continue PPI, short-term carafate  Difficulty swallowing pills/nausea without vomiting: Patient's symptoms of nausea and difficulty swallowing pills have been occuring for the last 2-3 months. She has had issues with keeping food down and tries to eat small amounts at a time. She reports an unintentional weight loss of 30 lbs. Per our chart she has lost 10 lbs since 06/16/14. 20 lbs since 08/15/13. Her nausea and stomach pain is improved today with oral suspension PPI, sublingual Zofran, and Carafate.  -Switched to oral suspension PPI -Will discuss with pharmacy on options for crushable pills, liquid/suspension forms available through her pharmacy -Soft diet as tolerated -Speech evaluation appreciated  -GI following, appreciate rec's -Continue Carafate short-term  Depression: Timeline of her symptoms appears to have begun when she was taking Zoloft (sertraline) for depression per patient report. She states that she stopped taking Zoloft about 1-2 months ago without tapering which may have contributed to her symptoms of nausea. Zoloft has an intermediate risk for withdrawal symptoms which would usually last 1-2 weeks after abrupt discontinuation, and at this time do not think her symptoms are likely attributed to this. She may have some untreated underlying depression or eating disorder complicating her current difficulties. -Will talk with patient about her depression and relation to her symptoms    Dispo: Disposition  is deferred at this time, awaiting improvement of current medical problems.    The patient does have a current PCP (Ejiroghene Wendall Stade, MD) and does need an [facility] hospital follow-up appointment after discharge.  The patient does not have transportation limitations that hinder transportation to clinic appointments.     Darreld Mclean, MD 12/22/2014, 12:00 PM

## 2014-12-26 ENCOUNTER — Encounter: Payer: Self-pay | Admitting: Internal Medicine

## 2014-12-26 ENCOUNTER — Telehealth (HOSPITAL_COMMUNITY): Payer: Self-pay

## 2014-12-26 ENCOUNTER — Ambulatory Visit (INDEPENDENT_AMBULATORY_CARE_PROVIDER_SITE_OTHER): Payer: Medicaid Other | Admitting: Internal Medicine

## 2014-12-26 VITALS — BP 127/97 | HR 98 | Temp 98.9°F | Ht 66.0 in | Wt 167.4 lb

## 2014-12-26 DIAGNOSIS — B9689 Other specified bacterial agents as the cause of diseases classified elsewhere: Secondary | ICD-10-CM | POA: Diagnosis not present

## 2014-12-26 DIAGNOSIS — I252 Old myocardial infarction: Secondary | ICD-10-CM

## 2014-12-26 DIAGNOSIS — R1310 Dysphagia, unspecified: Secondary | ICD-10-CM

## 2014-12-26 DIAGNOSIS — R1311 Dysphagia, oral phase: Secondary | ICD-10-CM | POA: Diagnosis not present

## 2014-12-26 DIAGNOSIS — N390 Urinary tract infection, site not specified: Secondary | ICD-10-CM

## 2014-12-26 DIAGNOSIS — R197 Diarrhea, unspecified: Secondary | ICD-10-CM

## 2014-12-26 DIAGNOSIS — I513 Intracardiac thrombosis, not elsewhere classified: Secondary | ICD-10-CM

## 2014-12-26 DIAGNOSIS — N3000 Acute cystitis without hematuria: Secondary | ICD-10-CM

## 2014-12-26 MED ORDER — SULFAMETHOXAZOLE-TRIMETHOPRIM 800-160 MG PO TABS: 1.0000 | ORAL_TABLET | Freq: Two times a day (BID) | ORAL | Status: DC

## 2014-12-26 MED ORDER — ENOXAPARIN SODIUM 150 MG/ML ~~LOC~~ SOLN: 150.0000 mg | SUBCUTANEOUS | Status: DC

## 2014-12-26 NOTE — Assessment & Plan Note (Signed)
Was switched to lovenox from coumadin, as she was complaining of difficulty swallowing. Also difficulty getting her INR therapeutic. Pt requesting for refills today of lovenox, as she has only 3 syringes left. Next appointment with cards- 01/09/2015.   Plan- Provided 2 week refills, ?dose 150mg  daily, will send a message to pharmacist to clarify dose. hopefully we can do once daily dosing. - Also consideration for NOACs.

## 2014-12-26 NOTE — Progress Notes (Signed)
Patient ID: Lindsey [name], female   DOB: [birth year], 44 y.o.   MRN: [id number]   Subjective:   Patient ID: Lindsey [name] female   DOB: 1970/04/19 44 y.o.   MRN: [id number]  HPI: Ms.Lindsey [name] Angst is a 44 y.o. with PMH listed below. Presented today for hospital follow up, also with complaints of diarrhea.  Pt was admitted- 10/5 to 12/22/2014. She presented with c/o chest pain and difficulty swallowing pills, and nausea with vomiting. She says he has not had any more chest pain, but she still has all the other complaints. She was started on Pantoprazole or liquid pepcid and sucralfate which she has been compliant with.  For her dysphagia, she had a barium swallow done, which did not explain the cause of her dyphagia. GI consult while inpatient- evaluation- it was not a true dysphagia, D/c zoloft, cont zofran, consider GES for nausea and vomiting, also short term use of carafate.  Past Medical History  Diagnosis Date  . Stroke (HCC)     assoc with short term memory loss and right peripheral vision loss; age 76   . Blood transfusion   . Anxiety   . Coronary artery disease     Apical LAD infarction '00; NSTEMI s/p BMS to prox LAD '09; Cath 12/2011 single vessel CAD w/ patent LAD stent w/ stable mild ISR, nl LV systolic fxn  . Anemia   . Antithrombin III deficiency (HCC)     ?pt not sure if true diagnosis  . Tobacco abuse   . High blood pressure   . Myocardial infarction [facility])    Current Outpatient Prescriptions  Medication Sig Dispense Refill  . atorvastatin (LIPITOR) 40 MG tablet Take 2 tablets (80 mg total) by mouth daily. (Patient not taking: Reported on 12/20/2014) 90 tablet 11  . clopidogrel (PLAVIX) 75 MG tablet Take 75 mg by mouth daily.    Marland Kitchen enoxaparin (LOVENOX) 150 MG/ML injection Inject 150 mg into the skin.    . hydrocortisone (ANUSOL-HC) 25 MG suppository Place 1 suppository (25 mg total) rectally 2 (two) times daily. (Patient not taking: Reported on 05/12/2014) 12 suppository 3    . hydrocortisone-pramoxine (PROCTOFOAM-HC) rectal foam Place 1 applicator rectally 2 (two) times daily. 10 g 0  . metoprolol succinate (TOPROL XL) 25 MG 24 hr tablet Take 1 tablet (25 mg total) by mouth daily. (Patient not taking: Reported on 12/20/2014) 30 tablet 12  . nitroGLYCERIN (NITROSTAT) 0.4 MG SL tablet Place 1 tablet (0.4 mg total) under the tongue every 5 (five) minutes as needed for chest pain (up to 3 doses). 25 tablet 3  . ondansetron (ZOFRAN ODT) 4 MG disintegrating tablet Take 1 tablet (4 mg total) by mouth every 8 (eight) hours as needed for nausea or vomiting. 10 tablet 0  . pantoprazole sodium (PROTONIX) 40 mg/20 mL PACK Place 20 mLs (40 mg total) into feeding tube daily. 30 each 0  . sucralfate (CARAFATE) 1 GM/10ML suspension Take 10 mLs (1 g total) by mouth 4 (four) times daily -  with meals and at bedtime. 420 mL 0  . sulfamethoxazole-trimethoprim (BACTRIM DS,SEPTRA DS) 800-160 MG tablet Take 1 tablet by mouth 2 (two) times daily. 6 tablet 0   No current facility-administered medications for this visit.   Family History  Problem Relation Age of Onset  . Heart disease Brother     arrhythmia; died  . Breast cancer Maternal Aunt    Social History   Social History  . Marital Status: Divorced  Spouse Name: N/A  . Number of Children: 2  . Years of Education: N/A   Social History Main Topics  . Smoking status: Former Smoker -- 0.20 packs/day for 1 years    Types: Cigarettes    Quit date: 11/26/2014  . Smokeless tobacco: Never Used     Comment: STOPPED SMOKING IN SEPTEMBER 2016  . Alcohol Use: 0.0 oz/week    0 Cans of beer per week  . Drug Use: Yes    Special: Cocaine     Comment: hx of use x 1  . Sexual Activity: No   Other Topics Concern  . Not on file   Social History Narrative   Review of Systems: CONSTITUTIONAL- No Fever, weightloss, night sweat or change in appetite. SKIN- No Rash, colour changes or itching. HEAD- No Headache or  dizziness. Mouth/throat- No Sorethroat, dentures, or bleeding gums. RESPIRATORY- No Cough or SOB. CARDIAC- No Palpitations, or chest pain. GI- No  vomiting, diarrhoea,  abd pain. URINARY- Has new dysuria, that started day after discharge. NEUROLOGIC- No Numbness, syncope, seizures or burning. [facility]- Denies depression or anxiety.  Objective:  Physical Exam: Filed Vitals:   12/26/14 1528  BP: 127/97  Pulse: 98  Temp: 98.9 F (37.2 C)  TempSrc: Oral  Height: 5\' 6"  (1.676 m)  Weight: 167 lb 6.4 oz (75.932 kg)  SpO2: 100%   GENERAL- alert, co-operative, appears as stated age, not in any distress. HEENT- Atraumatic, normocephalic, PERRL, EOMI, neck supple, moist oral mucosa. CARDIAC- RRR, no murmurs, rubs or gallops. RESP- Moving equal volumes of air, and clear to auscultation bilaterally, no wheezes or crackles. ABDOMEN- Soft, nontender, bowel sounds present. BACK- Normal curvature of the spine, No tenderness along the vertebrae, no CVA tenderness. NEURO- No obvious Cr N abnormality, strenght upper and lower extremities- intact Gait- Normal. EXTREMITIES- pulse 2+, symmetric, no pedal edema. SKIN- Warm, dry, No rash or lesion. PSYCH- Normal mood and affect, appropriate thought content and speech.  Assessment & Plan:  The patient's case and plan of care was discussed with attending physician, Dr. Heide Spark.  Please see problem based charting for assessment and plan.

## 2014-12-26 NOTE — Assessment & Plan Note (Signed)
Still ongoing. 2-3 times a day. Watery. Present at night also. She did not bring in her stool samples after her last visit. Weight despite chronic diarrhea apperas to fluctuate +/- 5 about her baseline. She has good appetite.   Plan- Stool for C. Diff - Stool Na and K, check osmolar gap to differentiate- Osmotic from secretory diarrhea- An osmotic gap of >125 mOsm/kg suggests an osmotic diarrhea while a gap of <50 mOsm/kg suggests a secretory diarrhea. - Stool for ova and parasite - Stool lactoferrin.

## 2014-12-26 NOTE — Telephone Encounter (Signed)
Encounter complete. 

## 2014-12-26 NOTE — Assessment & Plan Note (Signed)
Pt has had Barium swallow done results- 12/21/2014- The patient has oral stage difficulty with gagging but once the swallow is initiated the exam is normal. Gi saw patient while on admission. Recs were for- evaluation- it was not a true dysphagia, D/c zoloft, cont zofran, consider GES for nausea and vomiting, also short term use of carafate. Last EGD done for hematemesis- 03/2013 showed esophagitis and duodenitis.   Plan- Pt still with complaints of difficulty swallowing pills, meat and bread.  - Cont protonix. - Work up anemia- with anemia panel, consider Plummer- vinson syndrome. - Referral to ENT - Consider referral to GI for repeat EGD if still persistent and ENT cannot determine cause. - Also consider psychological cause.

## 2014-12-26 NOTE — Assessment & Plan Note (Signed)
Pt was treated with 2g of metronidazole on admission for trichomonas in her urine. She denied having any symptoms. She did endorse having dysuria and frequency that started the day after admission. UA was positive for leukocytes, 7-10 WBC, few bacteria.  Plan- Treat UTI with bactrim DS- BID for three days.

## 2014-12-27 ENCOUNTER — Ambulatory Visit (HOSPITAL_COMMUNITY): Admit: 2014-12-27 | Payer: Medicaid Other

## 2014-12-27 ENCOUNTER — Telehealth: Payer: Self-pay | Admitting: *Deleted

## 2014-12-27 NOTE — Telephone Encounter (Signed)
RETURNED CALL TO PATIENT. MESSAGE TO DR.E  WILL NOT HAVE ENOUGH LOVENOX. CARDIOLOGY APPOINTMENT IS NOT UNTIL October 26.016.  BASKET TO DR. E.

## 2014-12-29 ENCOUNTER — Other Ambulatory Visit: Payer: Self-pay | Admitting: Internal Medicine

## 2014-12-29 DIAGNOSIS — I513 Intracardiac thrombosis, not elsewhere classified: Secondary | ICD-10-CM

## 2014-12-29 MED ORDER — ENOXAPARIN SODIUM 150 MG/ML ~~LOC~~ SOLN: 100.0000 mg | SUBCUTANEOUS | Status: DC

## 2014-12-29 MED ORDER — ENOXAPARIN SODIUM 100 MG/ML ~~LOC~~ SOLN: 100.0000 mg | SUBCUTANEOUS | Status: DC

## 2014-12-29 NOTE — Progress Notes (Signed)
Talked to pharmacy, Changed prescription to 100mg  once daily of lovenox.   Ejiro.

## 2014-12-30 NOTE — Progress Notes (Signed)
Internal Medicine Clinic Attending  Case discussed with Dr. Emokpae soon after the resident saw the patient.  We reviewed the resident's history and exam and pertinent patient test results.  I agree with the assessment, diagnosis, and plan of care documented in the resident's note. 

## 2015-01-08 ENCOUNTER — Telehealth (HOSPITAL_COMMUNITY): Payer: Self-pay | Admitting: *Deleted

## 2015-01-10 ENCOUNTER — Ambulatory Visit (INDEPENDENT_AMBULATORY_CARE_PROVIDER_SITE_OTHER): Payer: Medicaid Other | Admitting: Cardiology

## 2015-01-10 ENCOUNTER — Encounter: Payer: Self-pay | Admitting: Cardiology

## 2015-01-10 VITALS — BP 138/84 | HR 98 | Ht 66.0 in | Wt 166.0 lb

## 2015-01-10 DIAGNOSIS — D6859 Other primary thrombophilia: Secondary | ICD-10-CM | POA: Diagnosis not present

## 2015-01-10 DIAGNOSIS — Z8673 Personal history of transient ischemic attack (TIA), and cerebral infarction without residual deficits: Secondary | ICD-10-CM

## 2015-01-10 DIAGNOSIS — I513 Intracardiac thrombosis, not elsewhere classified: Secondary | ICD-10-CM

## 2015-01-10 DIAGNOSIS — Z9861 Coronary angioplasty status: Secondary | ICD-10-CM

## 2015-01-10 DIAGNOSIS — R110 Nausea: Secondary | ICD-10-CM | POA: Diagnosis not present

## 2015-01-10 DIAGNOSIS — R079 Chest pain, unspecified: Secondary | ICD-10-CM

## 2015-01-10 DIAGNOSIS — Z7901 Long term (current) use of anticoagulants: Secondary | ICD-10-CM

## 2015-01-10 DIAGNOSIS — I2510 Atherosclerotic heart disease of native coronary artery without angina pectoris: Secondary | ICD-10-CM

## 2015-01-10 DIAGNOSIS — R0789 Other chest pain: Secondary | ICD-10-CM

## 2015-01-10 DIAGNOSIS — I213 ST elevation (STEMI) myocardial infarction of unspecified site: Secondary | ICD-10-CM

## 2015-01-10 NOTE — Assessment & Plan Note (Signed)
During her recent hospitalization, "same as when I had my stent"

## 2015-01-10 NOTE — Assessment & Plan Note (Signed)
Pt recently hospitalized with nausea and difficulty swallowing pills. Unclear etiology, but now better

## 2015-01-10 NOTE — Patient Instructions (Addendum)
Your physician has recommended you make the following change in your medication: restart the warfarin TODAY at the previous dose. Call your Internal Medicine office to have your INR checked on Friday.  Your physician has requested that you have en exercise stress myoview. For further information please visit [email or web address] Please follow instruction sheet, as given.   Your physician recommends that you schedule a follow-up appointment in: December with Dr Jens Som.

## 2015-01-10 NOTE — Assessment & Plan Note (Signed)
Indication : Apical Mural Thrombus. Resolve on recent echo Dec 2015 but the thrombus developed years after her previous infarct and therefore has a propensity to form thrombi at the apex. Cards has written that she will need lifelong warfarin, ep considering her prior CVA and history antithrombin 3 deficiency Duration : lifelong as long as benefits outweigh risks Recently placed on Lovenox as she could not take pills

## 2015-01-10 NOTE — Assessment & Plan Note (Signed)
She was told this during her first pregnancy in [facility]- "one doctor said I had it, one said I didn't".

## 2015-01-10 NOTE — Assessment & Plan Note (Addendum)
Seen on echo May 2015

## 2015-01-10 NOTE — Progress Notes (Signed)
01/10/2015 Lindsey [name]   1970-11-20  [id number]  Primary Physician [name], MD Primary Cardiologist: Dr Jens Som  HPI:  44 y/o AA female with a history of CAD, s/p  BMS to LAD in 2009,  prior CVA, ? Anti-thrombin III deficiency, HTN, HL. Admitted in 07/2013 with pancreatitis. Echo then demonstrated apical mural thrombus. Placed on coumadin. ASA and Plavix were d/c'd. It was recommended that she be on Coumadin for life with prior CVA and history of antithrombin 3 deficiency.             She was recently admitted with nausea and vomiting and trouble swallowing her pills. GI work up was unremarkable. We saw her in consult for chest pain. She is seen in the office today for follow up. She had been placed on Lovenox till she could take POs. The question of switching her to a NOAC was also raised by the internal medicine service.              Today she tells me she has not any further chest pian. Dr Excell Seltzer had seen her in onsult and had recommended an OP Myoview. The internal medicine clinic follows her Coumadin Rx. She has not had any further trouble swallowing pills.   Current Outpatient Prescriptions  Medication Sig Dispense Refill  . atorvastatin (LIPITOR) 40 MG tablet Take 2 tablets (80 mg total) by mouth daily. 90 tablet 11  . clopidogrel (PLAVIX) 75 MG tablet Take 75 mg by mouth daily.    Marland Kitchen enoxaparin (LOVENOX) 100 MG/ML injection Inject 1 mL (100 mg total) into the skin daily. 14 Syringe 0  . hydrocortisone (ANUSOL-HC) 25 MG suppository Place 1 suppository (25 mg total) rectally 2 (two) times daily. 12 suppository 3  . hydrocortisone-pramoxine (PROCTOFOAM-HC) rectal foam Place 1 applicator rectally 2 (two) times daily. 10 g 0  . metoprolol succinate (TOPROL XL) 25 MG 24 hr tablet Take 1 tablet (25 mg total) by mouth daily. 30 tablet 12  . nitroGLYCERIN (NITROSTAT) 0.4 MG SL tablet Place 1 tablet (0.4 mg total) under the tongue every 5 (five) minutes as needed for chest  pain (up to 3 doses). 25 tablet 3  . ondansetron (ZOFRAN ODT) 4 MG disintegrating tablet Take 1 tablet (4 mg total) by mouth every 8 (eight) hours as needed for nausea or vomiting. 10 tablet 0  . pantoprazole sodium (PROTONIX) 40 mg/20 mL PACK Place 20 mLs (40 mg total) into feeding tube daily. 30 each 0  . sucralfate (CARAFATE) 1 GM/10ML suspension Take 10 mLs (1 g total) by mouth 4 (four) times daily -  with meals and at bedtime. 420 mL 0   No current facility-administered medications for this visit.    No Known Allergies  Social History   Social History  . Marital Status: Divorced    Spouse Name: N/A  . Number of Children: 2  . Years of Education: N/A   Occupational History  . Not on file.   Social History Main Topics  . Smoking status: Former Smoker -- 0.20 packs/day for 1 years    Types: Cigarettes    Quit date: 11/26/2014  . Smokeless tobacco: Never Used     Comment: STOPPED SMOKING IN SEPTEMBER 2016  . Alcohol Use: 0.0 oz/week    0 Cans of beer per week  . Drug Use: Yes    Special: Cocaine     Comment: hx of use x 1  . Sexual Activity: No   Other Topics Concern  .  Not on file   Social History Narrative     Review of Systems: General: negative for chills, fever, night sweats or weight changes.  Cardiovascular: negative for chest pain, dyspnea on exertion, edema, orthopnea, palpitations, paroxysmal nocturnal dyspnea or shortness of breath Dermatological: negative for rash Respiratory: negative for cough or wheezing Urologic: negative for hematuria Abdominal: negative for nausea, vomiting, diarrhea, bright red blood per rectum, melena, or hematemesis Neurologic: negative for visual changes, syncope, or dizziness All other systems reviewed and are otherwise negative except as noted above.    Blood pressure 138/84, pulse 98, height 5\' 6"  (1.676 m), weight 166 lb (75.297 kg).  General appearance: alert, cooperative, no distress and mildly obese Neck: no carotid  bruit and no JVD Lungs: clear to auscultation bilaterally Heart: regular rate and rhythm Extremities: extremities normal, atraumatic, no cyanosis or edema Skin: Skin color, texture, turgor normal. No rashes or lesions Neurologic: Grossly normal   ASSESSMENT AND PLAN:   Nausea without vomiting Pt recently hospitalized with nausea and difficulty swallowing pills. Unclear etiology, but now better  Antithrombin III deficiency [facility]) She was told this during her first pregnancy in [facility]- "one doctor said I had it, one said I didn't".  Apical mural thrombus Seen on echo May 2015  History of ischemic left PCA stroke .  Long term current use of anticoagulant therapy Indication : Apical Mural Thrombus. Resolve on recent echo Dec 2015 but the thrombus developed years after her previous infarct and therefore has a propensity to form thrombi at the apex. Cards has written that she will need lifelong warfarin, ep considering her prior CVA and history antithrombin 3 deficiency Duration : lifelong as long as benefits outweigh risks Recently placed on Lovenox as she could not take pills  Chest pain During her recent hospitalization, "same as when I had my stent"   PLAN  I discussed NOAC Rx with our pharmacist. There is no indication for NOAC Rx with LVT, only PAF, DVT, PE. I told Ms Scales she can resume her Coumadin, it can be crushed if needed, and follow up with the internal medicine clinic. She will be set up for an exercise Myoview.   Lindsey Shelter K PA-C 01/10/2015 4:24 PM

## 2015-01-11 ENCOUNTER — Telehealth (HOSPITAL_COMMUNITY): Payer: Self-pay

## 2015-01-11 NOTE — Telephone Encounter (Signed)
Encounter complete. 

## 2015-01-12 ENCOUNTER — Inpatient Hospital Stay (HOSPITAL_COMMUNITY): Admission: RE | Admit: 2015-01-12 | Payer: Medicaid Other | Source: Ambulatory Visit

## 2015-01-15 NOTE — Addendum Note (Signed)
Addended by: Bufford Spikes on: 01/15/2015 11:36 AM   Modules accepted: Orders

## 2015-01-16 NOTE — Telephone Encounter (Signed)
Encounter complete. 

## 2015-01-16 NOTE — Addendum Note (Signed)
Addended by: Neomia Dear on: 01/16/2015 06:03 PM   Modules accepted: Orders

## 2015-01-17 ENCOUNTER — Telehealth (HOSPITAL_COMMUNITY): Payer: Self-pay

## 2015-01-17 NOTE — Telephone Encounter (Signed)
Encounter complete. 

## 2015-01-19 ENCOUNTER — Inpatient Hospital Stay (HOSPITAL_COMMUNITY): Admission: RE | Admit: 2015-01-19 | Payer: Medicaid Other | Source: Ambulatory Visit

## 2015-01-28 ENCOUNTER — Emergency Department (HOSPITAL_COMMUNITY): Payer: Medicaid Other

## 2015-01-28 ENCOUNTER — Encounter (HOSPITAL_COMMUNITY): Payer: Self-pay | Admitting: Emergency Medicine

## 2015-01-28 ENCOUNTER — Inpatient Hospital Stay (HOSPITAL_COMMUNITY)
Admission: EM | Admit: 2015-01-28 | Discharge: 2015-02-01 | DRG: 281 | Disposition: A | Discharge status: Home / Self Care | Payer: Medicaid Other | Attending: Internal Medicine | Admitting: Internal Medicine

## 2015-01-28 DIAGNOSIS — R198 Other specified symptoms and signs involving the digestive system and abdomen: Secondary | ICD-10-CM | POA: Diagnosis present

## 2015-01-28 DIAGNOSIS — I513 Intracardiac thrombosis, not elsewhere classified: Secondary | ICD-10-CM | POA: Diagnosis present

## 2015-01-28 DIAGNOSIS — Z7901 Long term (current) use of anticoagulants: Secondary | ICD-10-CM

## 2015-01-28 DIAGNOSIS — K219 Gastro-esophageal reflux disease without esophagitis: Secondary | ICD-10-CM | POA: Diagnosis present

## 2015-01-28 DIAGNOSIS — Z955 Presence of coronary angioplasty implant and graft: Secondary | ICD-10-CM | POA: Diagnosis not present

## 2015-01-28 DIAGNOSIS — I213 ST elevation (STEMI) myocardial infarction of unspecified site: Secondary | ICD-10-CM | POA: Diagnosis not present

## 2015-01-28 DIAGNOSIS — I252 Old myocardial infarction: Secondary | ICD-10-CM

## 2015-01-28 DIAGNOSIS — Z8673 Personal history of transient ischemic attack (TIA), and cerebral infarction without residual deficits: Secondary | ICD-10-CM

## 2015-01-28 DIAGNOSIS — I2510 Atherosclerotic heart disease of native coronary artery without angina pectoris: Secondary | ICD-10-CM | POA: Diagnosis not present

## 2015-01-28 DIAGNOSIS — R1310 Dysphagia, unspecified: Secondary | ICD-10-CM | POA: Diagnosis present

## 2015-01-28 DIAGNOSIS — R7989 Other specified abnormal findings of blood chemistry: Secondary | ICD-10-CM | POA: Diagnosis not present

## 2015-01-28 DIAGNOSIS — I69311 Memory deficit following cerebral infarction: Secondary | ICD-10-CM | POA: Diagnosis not present

## 2015-01-28 DIAGNOSIS — Z87891 Personal history of nicotine dependence: Secondary | ICD-10-CM | POA: Diagnosis not present

## 2015-01-28 DIAGNOSIS — I214 Non-ST elevation (NSTEMI) myocardial infarction: Principal | ICD-10-CM | POA: Diagnosis present

## 2015-01-28 DIAGNOSIS — E785 Hyperlipidemia, unspecified: Secondary | ICD-10-CM | POA: Diagnosis present

## 2015-01-28 DIAGNOSIS — I25119 Atherosclerotic heart disease of native coronary artery with unspecified angina pectoris: Secondary | ICD-10-CM | POA: Diagnosis present

## 2015-01-28 DIAGNOSIS — D6859 Other primary thrombophilia: Secondary | ICD-10-CM | POA: Diagnosis present

## 2015-01-28 DIAGNOSIS — I69312 Visuospatial deficit and spatial neglect following cerebral infarction: Secondary | ICD-10-CM | POA: Diagnosis not present

## 2015-01-28 DIAGNOSIS — R0789 Other chest pain: Secondary | ICD-10-CM | POA: Diagnosis not present

## 2015-01-28 DIAGNOSIS — I10 Essential (primary) hypertension: Secondary | ICD-10-CM | POA: Diagnosis present

## 2015-01-28 DIAGNOSIS — R072 Precordial pain: Secondary | ICD-10-CM | POA: Diagnosis not present

## 2015-01-28 DIAGNOSIS — Z9861 Coronary angioplasty status: Secondary | ICD-10-CM

## 2015-01-28 DIAGNOSIS — R079 Chest pain, unspecified: Secondary | ICD-10-CM | POA: Diagnosis present

## 2015-01-28 DIAGNOSIS — Z7902 Long term (current) use of antithrombotics/antiplatelets: Secondary | ICD-10-CM

## 2015-01-28 LAB — CBC
HCT: 31.4 % — ABNORMAL LOW (ref 36.0–46.0)
Hemoglobin: 10.2 g/dL — ABNORMAL LOW (ref 12.0–15.0)
MCH: 27.8 pg (ref 26.0–34.0)
MCHC: 32.5 g/dL (ref 30.0–36.0)
MCV: 85.6 fL (ref 78.0–100.0)
Platelets: 194 K/uL (ref 150–400)
RBC: 3.67 MIL/uL — ABNORMAL LOW (ref 3.87–5.11)
RDW: 18.4 % — ABNORMAL HIGH (ref 11.5–15.5)
WBC: 4.3 K/uL (ref 4.0–10.5)

## 2015-01-28 LAB — BASIC METABOLIC PANEL
Anion gap: 9 (ref 5–15)
CALCIUM: 8.2 mg/dL — AB (ref 8.9–10.3)
CHLORIDE: 104 mmol/L (ref 101–111)
CO2: 25 mmol/L (ref 22–32)
CREATININE: 0.56 mg/dL (ref 0.44–1.00)
GFR calc non Af Amer: >60 mL/min (ref >60)
Glucose, Bld: 106 mg/dL — ABNORMAL HIGH (ref 65–99)
Potassium: 3.4 mmol/L — ABNORMAL LOW (ref 3.5–5.1)
SODIUM: 138 mmol/L (ref 135–145)

## 2015-01-28 LAB — I-STAT TROPONIN, ED
Troponin i, poc: 0.11 ng/mL (ref 0.00–0.08)
Troponin i, poc: 0.11 ng/mL (ref 0.00–0.08)

## 2015-01-28 LAB — MRSA PCR SCREENING: MRSA BY PCR: NEGATIVE

## 2015-01-28 LAB — HEPARIN LEVEL (UNFRACTIONATED): HEPARIN UNFRACTIONATED: 0.11 [IU]/mL — AB (ref 0.30–0.70)

## 2015-01-28 LAB — MAGNESIUM: Magnesium: 1.5 mg/dL — ABNORMAL LOW (ref 1.7–2.4)

## 2015-01-28 LAB — TROPONIN I: Troponin I: 0.2 ng/mL — ABNORMAL HIGH (ref <0.031)

## 2015-01-28 MED ORDER — CLOPIDOGREL BISULFATE 75 MG PO TABS: 75.0000 mg | ORAL_TABLET | Freq: Every day | ORAL | Status: DC

## 2015-01-28 MED ORDER — SUCRALFATE 1 GM/10ML PO SUSP
1.0000 g | Freq: Three times a day (TID) | ORAL | Status: DC
  Administered 2015-01-28 – 2015-02-01 (×12): 1 g via ORAL
  Filled 2015-01-28 (×12): qty 10

## 2015-01-28 MED ORDER — SODIUM CHLORIDE 0.9 % IJ SOLN
3.0000 mL | Freq: Two times a day (BID) | INTRAMUSCULAR | Status: DC
  Administered 2015-01-29 – 2015-02-01 (×5): 3 mL via INTRAVENOUS

## 2015-01-28 MED ORDER — METOPROLOL SUCCINATE ER 25 MG PO TB24
25.0000 mg | ORAL_TABLET | Freq: Every day | ORAL | Status: DC
  Administered 2015-01-29 – 2015-02-01 (×4): 25 mg via ORAL
  Filled 2015-01-28 (×4): qty 1

## 2015-01-28 MED ORDER — GI COCKTAIL ~~LOC~~
30.0000 mL | Freq: Once | ORAL | Status: AC
  Administered 2015-01-29: 30 mL via ORAL
  Filled 2015-01-28: qty 30

## 2015-01-28 MED ORDER — ACETAMINOPHEN 500 MG PO TABS
1000.0000 mg | ORAL_TABLET | Freq: Four times a day (QID) | ORAL | Status: DC | PRN
  Administered 2015-01-29 – 2015-01-30 (×3): 1000 mg via ORAL
  Filled 2015-01-28 (×4): qty 2

## 2015-01-28 MED ORDER — PANTOPRAZOLE SODIUM 40 MG PO PACK: 40.0000 mg | PACK | Freq: Every day | ORAL | Status: DC

## 2015-01-28 MED ORDER — PANTOPRAZOLE SODIUM 40 MG PO TBEC
40.0000 mg | DELAYED_RELEASE_TABLET | Freq: Every day | ORAL | Status: DC
  Administered 2015-01-28 – 2015-02-01 (×5): 40 mg via ORAL
  Filled 2015-01-28 (×5): qty 1

## 2015-01-28 MED ORDER — NITROGLYCERIN IN D5W 200-5 MCG/ML-% IV SOLN
0.0000 ug/min | INTRAVENOUS | Status: DC
  Administered 2015-01-28: 5 ug/min via INTRAVENOUS
  Filled 2015-01-28: qty 250

## 2015-01-28 MED ORDER — HYDROCORTISONE ACE-PRAMOXINE 1-1 % RE FOAM: 1.0000 | Freq: Three times a day (TID) | RECTAL | Status: DC | PRN

## 2015-01-28 MED ORDER — HEPARIN (PORCINE) IN NACL 100-0.45 UNIT/ML-% IJ SOLN
1300.0000 [IU]/h | INTRAMUSCULAR | Status: DC
  Administered 2015-01-28: 850 [IU]/h via INTRAVENOUS
  Administered 2015-01-29: 1300 [IU]/h via INTRAVENOUS
  Filled 2015-01-28 (×2): qty 250

## 2015-01-28 MED ORDER — HEPARIN BOLUS VIA INFUSION
4000.0000 [IU] | Freq: Once | INTRAVENOUS | Status: AC
  Administered 2015-01-28: 4000 [IU] via INTRAVENOUS
  Filled 2015-01-28: qty 4000

## 2015-01-28 MED ORDER — METOPROLOL SUCCINATE ER 25 MG PO TB24: 25.0000 mg | ORAL_TABLET | Freq: Every day | ORAL | Status: DC

## 2015-01-28 MED ORDER — NITROGLYCERIN 0.4 MG SL SUBL
0.4000 mg | SUBLINGUAL_TABLET | SUBLINGUAL | Status: DC | PRN
  Administered 2015-01-28: 0.4 mg via SUBLINGUAL
  Filled 2015-01-28: qty 1

## 2015-01-28 MED ORDER — CLOPIDOGREL BISULFATE 75 MG PO TABS
75.0000 mg | ORAL_TABLET | Freq: Every day | ORAL | Status: DC
  Administered 2015-01-29 – 2015-02-01 (×4): 75 mg via ORAL
  Filled 2015-01-28 (×4): qty 1

## 2015-01-28 MED ORDER — NITROGLYCERIN 2 % TD OINT
1.0000 [in_us] | TOPICAL_OINTMENT | Freq: Once | TRANSDERMAL | Status: AC
  Administered 2015-01-28: 1 [in_us] via TOPICAL
  Filled 2015-01-28: qty 1

## 2015-01-28 MED ORDER — MORPHINE SULFATE (PF) 2 MG/ML IV SOLN
2.0000 mg | INTRAVENOUS | Status: DC | PRN
  Administered 2015-01-28 – 2015-02-01 (×19): 2 mg via INTRAVENOUS
  Filled 2015-01-28 (×19): qty 1

## 2015-01-28 MED ORDER — POTASSIUM CHLORIDE CRYS ER 20 MEQ PO TBCR
40.0000 meq | EXTENDED_RELEASE_TABLET | Freq: Once | ORAL | Status: AC
  Administered 2015-01-28: 40 meq via ORAL
  Filled 2015-01-28: qty 2

## 2015-01-28 MED ORDER — ATORVASTATIN CALCIUM 40 MG PO TABS
40.0000 mg | ORAL_TABLET | Freq: Every day | ORAL | Status: DC
Start: 2015-01-29 — End: 2015-02-01
  Administered 2015-01-29 – 2015-02-01 (×4): 40 mg via ORAL
  Filled 2015-01-28 (×4): qty 1

## 2015-01-28 MED ORDER — FENTANYL CITRATE (PF) 100 MCG/2ML IJ SOLN
50.0000 ug | Freq: Once | INTRAMUSCULAR | Status: AC
Start: 2015-01-28 — End: 2015-01-28
  Administered 2015-01-28: 50 ug via INTRAVENOUS
  Filled 2015-01-28: qty 2

## 2015-01-28 NOTE — H&P (Signed)
Date: 01/28/2015               Patient Name:  Lindsey [name] MRN: [id number]  DOB: 12/17/1970 Age / Sex: 44 y.o., female   PCP: Onnie Boer, MD         Medical Service: Internal Medicine Teaching Service         Attending Physician: Dr. Earl Lagos, MD    First Contact: Dr. Darreld Mclean Pager: [phone]  Second Contact: Dr. Hyacinth Meeker Pager: [phone]       After Hours (After 5p/  First Contact Pager: [phone]  weekends / holidays): Second Contact Pager: [phone]   Chief Complaint: Chest Pain  History of Present Illness: Lindsey [name] is a 44 y/o female with CAD with 2 MIs s/p bare metal stent placement to LAD in 2009, self-reported antithrombin III deficiency on lovenox, apical mural thrombus not present on echo in 2015, TIA in 2010, and HTN who presents with chest pain. She says she was awoken from her sleep around 10 AM to a squeezing type chest pain located at her sternum that radiated to her mid-back as well as her right arm. She says the pain slowly increased during the day, and was not severe enough that she was able to drive and pick up her daughter, but eventually her pain was 7/10 and she came to the ED. She says she has not eaten during this time period, but does describe that the pain felt like indigestion. Her pain is worsened with laughter. She mentions having previous episodes of similar chest pain that have occurred at rest, more noticeable after meals, that resolved after about 15 minutes. Her pain is less severe when she is sitting up. She has taken nitroglycerin today for her pain which gave her relief for maybe 2-3 minutes. She has had issues with swallowing pills recently, which is improving, and takes a liquid PPI at home. She denies any palpitations, SOB, N/V/D, GI bleed, melena, numbness, tingling, cough, or dysuria.  Patient says she quit smoking about 2 months ago, but has about 15 years smoking history. She does admit to binge drinking on the  weekends, more than 4 drinks. Denies illicit drug use.  Meds: Current Facility-Administered Medications  Medication Dose Route Frequency Provider Last Rate Last Dose  . acetaminophen (TYLENOL) tablet 1,000 mg  1,000 mg Oral Q6H PRN Hyacinth Meeker, MD      . Melene Muller ON 01/29/2015] atorvastatin (LIPITOR) tablet 40 mg  40 mg Oral Daily Tasrif Ahmed, MD      . Melene Muller ON 01/29/2015] clopidogrel (PLAVIX) tablet 75 mg  75 mg Oral Daily Nischal Narendra, MD      . gi cocktail (Maalox,Lidocaine,Donnatal)  30 mL Oral Once Tasrif Ahmed, MD      . heparin ADULT infusion 100 units/mL (25000 units/250 mL)  850 Units/hr Intravenous Continuous Herby Abraham, RPH 8.5 mL/hr at 01/28/15 1705 850 Units/hr at 01/28/15 1705  . hydrocortisone-pramoxine (PROCTOFOAM-HC) rectal foam 1 applicator  1 applicator Rectal TID PRN Hyacinth Meeker, MD      . Melene Muller ON 01/29/2015] metoprolol succinate (TOPROL-XL) 24 hr tablet 25 mg  25 mg Oral Daily Nischal Narendra, MD      . morphine 2 MG/ML injection 2 mg  2 mg Intravenous Q3H PRN Hyacinth Meeker, MD   2 mg at 01/28/15 1808  . nitroGLYCERIN (NITROSTAT) SL tablet 0.4 mg  0.4 mg Sublingual Q5 min PRN Barrett Henle, PA-C   0.4 mg at 01/28/15 1504  .  pantoprazole (PROTONIX) EC tablet 40 mg  40 mg Oral Daily Tasrif Ahmed, MD      . potassium chloride SA (K-DUR,KLOR-CON) CR tablet 40 mEq  40 mEq Oral Once Tasrif Ahmed, MD      . sodium chloride 0.9 % injection 3 mL  3 mL Intravenous Q12H Tasrif Ahmed, MD      . sucralfate (CARAFATE) 1 GM/10ML suspension 1 g  1 g Oral TID WC & HS Tasrif Ahmed, MD        Allergies: Allergies as of 01/28/2015  . (No Known Allergies)   Past Medical History  Diagnosis Date  . Stroke (HCC)     assoc with short term memory loss and right peripheral vision loss; age 1   . Blood transfusion   . Anxiety   . Coronary artery disease     Apical LAD infarction '00; NSTEMI s/p BMS to prox LAD '09; Cath 12/2011 single vessel CAD w/ patent LAD stent w/  stable mild ISR, nl LV systolic fxn  . Anemia   . Antithrombin III deficiency (HCC)     ?pt not sure if true diagnosis  . Tobacco abuse   . High blood pressure   . Myocardial infarction [facility])    Past Surgical History  Procedure Laterality Date  . Tubal ligation    . Cardiac catheterization    . Esophagogastroduodenoscopy N/A 06/09/2012    Procedure: ESOPHAGOGASTRODUODENOSCOPY (EGD);  Surgeon: Theda Belfast, MD;  Location: [facility] ENDOSCOPY;  Service: Endoscopy;  Laterality: N/A;  . Coronary angioplasty    . Esophagogastroduodenoscopy N/A 08/02/2013    Procedure: ESOPHAGOGASTRODUODENOSCOPY (EGD);  Surgeon: Meryl Dare, MD;  Location: [facility] ENDOSCOPY;  Service: Endoscopy;  Laterality: N/A;  . Left heart catheterization with coronary angiogram N/A 12/24/2011    Procedure: LEFT HEART CATHETERIZATION WITH CORONARY ANGIOGRAM;  Surgeon: Kathleene Hazel, MD;  Location: [facility] CATH LAB;  Service: Cardiovascular;  Laterality: N/A;  . Colonoscopy N/A 03/29/2014    Procedure: COLONOSCOPY;  Surgeon: Louis Meckel, MD;  Location: [facility] ENDOSCOPY;  Service: Endoscopy;  Laterality: N/A;   Family History  Problem Relation Age of Onset  . Heart disease Brother     arrhythmia; died  . Breast cancer Maternal Aunt    Social History   Social History  . Marital Status: Divorced    Spouse Name: N/A  . Number of Children: 2  . Years of Education: N/A   Occupational History  . Not on file.   Social History Main Topics  . Smoking status: Former Smoker -- 0.20 packs/day for 1 years    Types: Cigarettes    Quit date: 11/26/2014  . Smokeless tobacco: Never Used     Comment: STOPPED SMOKING IN SEPTEMBER 2016  . Alcohol Use: 0.0 oz/week    0 Cans of beer per week  . Drug Use: Yes    Special: Cocaine     Comment: hx of use x 1  . Sexual Activity: No   Other Topics Concern  . Not on file   Social History Narrative    Review of Systems: Pertinent items are noted in HPI.  Physical Exam: Blood  pressure 159/112, pulse 70, temperature 98.7 F (37.1 C), temperature source Oral, resp. rate 15, height 5\' 6"  (1.676 m), weight 164 lb 8 oz (74.617 kg), SpO2 100 %. Physical Exam  Constitutional: She is oriented to person, place, and time. She appears well-developed and well-nourished. No distress.  HENT:  Head: Normocephalic and atraumatic.  Eyes: EOM  are normal.  Cardiovascular: Normal rate, regular rhythm and intact distal pulses.   No murmur heard. Pulmonary/Chest: Effort normal and breath sounds normal. No respiratory distress. She has no wheezes. She has no rales. She exhibits no tenderness.  Abdominal: Soft. Bowel sounds are normal. She exhibits no distension. There is no tenderness.  Musculoskeletal: She exhibits no edema or tenderness.  Neurological: She is alert and oriented to person, place, and time.  Skin: Skin is warm. No rash noted.  Psychiatric: She has a normal mood and affect.     Lab results: Basic Metabolic Panel:  Recent Labs  28/41/32 1514  NA 138  K 3.4*  CL 104  CO2 25  GLUCOSE 106*  BUN <5*  CREATININE 0.56  CALCIUM 8.2*   Liver Function Tests: No results for input(s): AST, ALT, ALKPHOS, BILITOT, PROT, ALBUMIN in the last 72 hours. No results for input(s): LIPASE, AMYLASE in the last 72 hours. No results for input(s): AMMONIA in the last 72 hours. CBC:  Recent Labs  01/28/15 1514  WBC 4.3  HGB 10.2*  HCT 31.4*  MCV 85.6  PLT 194   Cardiac Enzymes: No results for input(s): CKTOTAL, CKMB, CKMBINDEX, TROPONINI in the last 72 hours. BNP: No results for input(s): PROBNP in the last 72 hours. D-Dimer: No results for input(s): DDIMER in the last 72 hours. CBG: No results for input(s): GLUCAP in the last 72 hours. Hemoglobin A1C: No results for input(s): HGBA1C in the last 72 hours. Fasting Lipid Panel: No results for input(s): CHOL, HDL, LDLCALC, TRIG, CHOLHDL, LDLDIRECT in the last 72 hours. Thyroid Function Tests: No results for  input(s): TSH, T4TOTAL, FREET4, T3FREE, THYROIDAB in the last 72 hours. Anemia Panel: No results for input(s): VITAMINB12, FOLATE, FERRITIN, TIBC, IRON, RETICCTPCT in the last 72 hours. Coagulation: No results for input(s): LABPROT, INR in the last 72 hours. Urine Drug Screen: Drugs of Abuse     Component Value Date/Time   LABOPIA NONE DETECTED 03/26/2014 1755   COCAINSCRNUR NONE DETECTED 03/26/2014 1755   LABBENZ NONE DETECTED 03/26/2014 1755   AMPHETMU NONE DETECTED 03/26/2014 1755   THCU NONE DETECTED 03/26/2014 1755   LABBARB NONE DETECTED 03/26/2014 1755    Alcohol Level: No results for input(s): ETH in the last 72 hours. Urinalysis: No results for input(s): COLORURINE, LABSPEC, PHURINE, GLUCOSEU, HGBUR, BILIRUBINUR, KETONESUR, PROTEINUR, UROBILINOGEN, NITRITE, LEUKOCYTESUR in the last 72 hours.  Invalid input(s): APPERANCEUR  Imaging results:  Dg Chest 2 View  01/28/2015  CLINICAL DATA:  Chest pain radiating into the right arm and back. Shortness of breath. Symptoms for approximately 2 hours. Initial encounter. EXAM: CHEST  2 VIEW COMPARISON:  PA and lateral chest and CT chest 12/20/2014. FINDINGS: The lungs are clear. Heart size is normal. No pneumothorax or pleural effusion. No focal bony abnormality. IMPRESSION: No acute disease. Electronically Signed   By: Drusilla Kanner M.D.   On: 01/28/2015 14:55    Other results: EKG: Sinus rhythm, T-wave inversions V1-V2, anterior Q waves, unchanged from 10/6.  Assessment & Plan by Problem:  Chest pain: Patient with atypical chest pain symptoms and unchanged EKG when compared to prior on 10/6, but with her cardiac history and mildly elevated troponin of 0.11 there is concern for NSTEMI. She described her pain as radiating to the mid-back which made [place] think about an aortic dissection, but manual blood pressure checks of bilateral arms were similar at 165/102 in RUE and 165/106 in the LUE which makes this less likely. Her symptoms  occurring with meals, worse in supine position, and similarity to her indigestion suggest her pain is due to GERD. She had issues with swallowing and was started on a Protonix solution, but she is unsure whether she has been taking it as she is on many medications. -Cardiology following, appreciate recommendations -Trend troponin, if increasing contact Cardiology for possible cath -IV Heparin -Nitroglycerin sublingual prn -Myoview tomorrow if troponin stable or improved -Protonix 40 mg daily -GI cocktail, Sucralfate -NPO  HTN: Blood pressures elevated at 150s/100s. She has not taken her BP medicine today as she usually takes it at night. She does not know if she snores at night, but does mention feeling tired during the day and falls asleep at her computer which may indicate some underlying OSA causing her to have elevated blood pressures. -Metoprolol succinate 25 mg po daily -Will likely need outpatient sleep study  CAD s/p bare metal stent of LAD in 2009: -Plavix -Atorvastatin 40 mg po daily  DVT ppx: Heparin  Diet: NPO  Code: Full  Dispo: Disposition is deferred at this time, awaiting improvement of current medical problems. Anticipated discharge in approximately 1-3 day(s).   The patient does have a current PCP (Ejiroghene Wendall Stade, MD) and does need an [facility] hospital follow-up appointment after discharge.  The patient does not have transportation limitations that hinder transportation to clinic appointments.  Signed: Darreld Mclean, MD 01/28/2015, 7:47 PM

## 2015-01-28 NOTE — Progress Notes (Signed)
ANTICOAGULATION CONSULT NOTE  Pharmacy Consult for heparin Indication: chest pain/ACS  No Known Allergies  Patient Measurements: Height: 5\' 6"  (167.6 cm) Weight: 164 lb 8 oz (74.617 kg) IBW/kg (Calculated) : 59.3 Heparin Dosing Weight: 72.6 kg  Vital Signs: Temp: 98.7 F (37.1 C) (11/13 2043) Temp Source: Oral (11/13 1900) BP: 148/99 mmHg (11/13 2153) Pulse Rate: 85 (11/13 2153)  Labs:  Recent Labs  01/28/15 1514 01/28/15 1913 01/28/15 2329  HGB 10.2*  --   --   HCT 31.4*  --   --   PLT 194  --   --   HEPARINUNFRC  --   --  0.11*  CREATININE 0.56  --   --   TROPONINI  --  0.20*  --     Estimated Creatinine Clearance: 90.7 mL/min (by C-G formula based on Cr of 0.56).  Assessment: 44 y.o. female with chest pain for heparin   Goal of Therapy:  Heparin level 0.3-0.7 units/ml Monitor platelets by anticoagulation protocol: Yes   Plan:  Heparin 2000 units IV bolus, then increase heparin 1100 units/hr Follow-up am labs.  Geannie Risen, PharmD, BCPS  01/28/2015 11:57 PM

## 2015-01-28 NOTE — Progress Notes (Signed)
ANTICOAGULATION CONSULT NOTE - Initial Consult  Pharmacy Consult for heparin Indication: chest pain/ACS  No Known Allergies  Patient Measurements: Height: 5\' 6"  (167.6 cm) Weight: 160 lb (72.576 kg) IBW/kg (Calculated) : 59.3 Heparin Dosing Weight: 72.6 kg  Vital Signs: Temp: 98.1 F (36.7 C) (11/13 1416) Temp Source: Oral (11/13 1416) BP: 143/102 mmHg (11/13 1549) Pulse Rate: 85 (11/13 1508)  Labs:  Recent Labs  01/28/15 1514  HGB 10.2*  HCT 31.4*  PLT 194  CREATININE 0.56    Estimated Creatinine Clearance: 89.6 mL/min (by C-G formula based on Cr of 0.56).   Medical History: Past Medical History  Diagnosis Date  . Stroke (HCC)     assoc with short term memory loss and right peripheral vision loss; age 47   . Blood transfusion   . Anxiety   . Coronary artery disease     Apical LAD infarction '00; NSTEMI s/p BMS to prox LAD '09; Cath 12/2011 single vessel CAD w/ patent LAD stent w/ stable mild ISR, nl LV systolic fxn  . Anemia   . Antithrombin III deficiency (HCC)     ?pt not sure if true diagnosis  . Tobacco abuse   . High blood pressure   . Myocardial infarction [facility])     Assessment: 44 yo F in ED with chest pain.  Pharmacy consulted to dose heparin for ACS/STEMI.  She is on LMWH at home for AT3 deficiency.  Troponin 0.11 x 2.  Wt 72.6 kg, creat 0.56, H/H 10.2/31.4, pltc 194.  Cardiology to be consulted. Pt on LMWH 100 mg sq daily at 1800, last dose 01/26/17.  Per med hx plas was LMWH x 2 more weeks then will go back to coumadin 7.5 mg daily until INR recheck after 1 week.   Goal of Therapy:  Heparin level 0.3-0.7 units/ml Monitor platelets by anticoagulation protocol: Yes   Plan:  Give 4000 units bolus x 1 Start heparin infusion at 850 units/hr Check anti-Xa level in 6 hours and daily while on heparin Continue to monitor H&H and platelets  Herby Abraham, Pharm.D. [phone] 01/28/2015 4:20 PM

## 2015-01-28 NOTE — Progress Notes (Addendum)
Subjective  Lindsey [name] was complaining of chest pain that did not respond to morphine and nitro paste, and Dr. Harmon Dun and I went to the patient's room for further evaluation. She reported that the pain was "on the inside" and somewhat radiated to the back. It did not radiate to her arms, like prior heart attacks she had experienced. She denied any shortness of breath. She reported she was concerned that morphine would make her an addict. Her i-Stat troponin earlier  was 0.11, and her first Troponin I was 0.20. She had one episode of non-bloody, non-bilious emesis after po potassium and carafate.  Objective  Filed Vitals:   01/28/15 2023 01/28/15 2041 01/28/15 2043 01/28/15 2053  BP: 168/110 162/113  149/108  Pulse: 86 76  80  Temp:   98.7 F (37.1 C)   TempSrc:      Resp: 12 22  15   Height:      Weight:      SpO2: 100% 100%  100%   General: Lying in bed, non-diaphoretic, not in any apparent distress Cardiovsascular: RRR, no m/r/g. Pain not reproducible on palpation Pulmonary: Clear to auscultation bilaterally  EKG: Normal sinus rhythm, unchanged from previous  Assessment and Plan  Chest pain could represent ACS, but increase between i-Stat and first Troponin I is insignificant and there are no EKG changes. Will continue to monitor chest pain and trend troponins overnight - F/u Troponins at 11:30pm and 5:30 am - Nitroglycerine gtt  Ruben Im, MD PGY1

## 2015-01-28 NOTE — Consult Note (Signed)
History and Physical  Patient ID: Lindsey [name] MRN: [id number], SOB: 1970/06/06 44 y.o. Date of Encounter: 01/28/2015, 4:59 PM  Primary Physician: [name], MD Primary Cardiologist: bc Primary Electrophysiologist:   Chief Complaint: chest pain   History of Present Illness: Lindsey [name] is a 44 y.o. female *with a surprisingly complex history for this young woman who proceeded to the today at the request of the emergency room because of chest pain with a borderline positive troponin and a non-changed ECG  Pain has been progressive today and has persisted unabated and was unchanged by NTG  CP worsened with laughter  She has a history of apical LAD infarction in 2000 and underwent bare metal stenting to her LAD in 2009. Last catheterization was 2013 demonstrating single-vessel disease with a patent LAD stent but only mild ISR. LV function was normal. She suddenly developed a a clot in her apex years remote from her infarct. She was started on Coumadin and Dr. Marsa Aris salt in the context of this, as well as her possible ATIII deficiency and prior stroke, that lifelong Coumadin is in order.  There has been a problem with dysphagia for which no explanation was found. This prompted the care team to switch her Coumadin to Lovenox until which time swallowing could be accomplished successfully   She's been seen in the hospital number of occasions over the last year with chest pain. Repeatedly Myoview scans have been scheduled and she has not shown for them.   Past Medical History  Diagnosis Date  . Stroke (HCC)     assoc with short term memory loss and right peripheral vision loss; age 45   . Blood transfusion   . Anxiety   . Coronary artery disease     Apical LAD infarction '00; NSTEMI s/p BMS to prox LAD '09; Cath 12/2011 single vessel CAD w/ patent LAD stent w/ stable mild ISR, nl LV systolic fxn  . Anemia   . Antithrombin III deficiency (HCC)     ?pt not sure if true diagnosis   . Tobacco abuse   . High blood pressure   . Myocardial infarction [facility])      Past Surgical History  Procedure Laterality Date  . Tubal ligation    . Cardiac catheterization    . Esophagogastroduodenoscopy N/A 06/09/2012    Procedure: ESOPHAGOGASTRODUODENOSCOPY (EGD);  Surgeon: Theda Belfast, MD;  Location: [facility] ENDOSCOPY;  Service: Endoscopy;  Laterality: N/A;  . Coronary angioplasty    . Esophagogastroduodenoscopy N/A 08/02/2013    Procedure: ESOPHAGOGASTRODUODENOSCOPY (EGD);  Surgeon: Meryl Dare, MD;  Location: [facility] ENDOSCOPY;  Service: Endoscopy;  Laterality: N/A;  . Left heart catheterization with coronary angiogram N/A 12/24/2011    Procedure: LEFT HEART CATHETERIZATION WITH CORONARY ANGIOGRAM;  Surgeon: Kathleene Hazel, MD;  Location[facility] CATH LAB;  Service: Cardiovascular;  Laterality: N/A;  . Colonoscopy N/A 03/29/2014    Procedure: COLONOSCOPY;  Surgeon: Louis Meckel, MD;  Location: [facility] ENDOSCOPY;  Service: Endoscopy;  Laterality: N/A;      Current Facility-Administered Medications  Medication Dose Route Frequency Provider Last Rate Last Dose  . heparin ADULT infusion 100 units/mL (25000 units/250 mL)  850 Units/hr Intravenous Continuous Herby Abraham, RPH      . heparin bolus via infusion 4,000 Units  4,000 Units Intravenous Once Herby Abraham, RPH      . nitroGLYCERIN (NITROSTAT) SL tablet 0.4 mg  0.4 mg Sublingual Q5 min PRN Barrett Henle, PA-C   0.4  mg at 01/28/15 1504   Current Outpatient Prescriptions  Medication Sig Dispense Refill  . acetaminophen (TYLENOL) 500 MG tablet Take 1,000 mg by mouth every 6 (six) hours as needed for headache (pain).    Marland Kitchen atorvastatin (LIPITOR) 40 MG tablet Take 2 tablets (80 mg total) by mouth daily. (Patient taking differently: Take 40 mg by mouth daily. ) 90 tablet 11  . clopidogrel (PLAVIX) 75 MG tablet Take 75 mg by mouth daily.    Marland Kitchen enoxaparin (LOVENOX) 100 MG/ML injection Inject 1 mL (100 mg total) into the skin  daily. (Patient taking differently: Inject 100 mg into the skin daily at 6 PM. ) 14 Syringe 0  . hydrocortisone-pramoxine (PROCTOFOAM-HC) rectal foam Place 1 applicator rectally 2 (two) times daily. (Patient taking differently: Place 1 applicator rectally 3 (three) times daily as needed for hemorrhoids. ) 10 g 0  . metoprolol succinate (TOPROL XL) 25 MG 24 hr tablet Take 1 tablet (25 mg total) by mouth daily. 30 tablet 12  . nitroGLYCERIN (NITROSTAT) 0.4 MG SL tablet Place 1 tablet (0.4 mg total) under the tongue every 5 (five) minutes as needed for chest pain (up to 3 doses). 25 tablet 3  . ondansetron (ZOFRAN ODT) 4 MG disintegrating tablet Take 1 tablet (4 mg total) by mouth every 8 (eight) hours as needed for nausea or vomiting. 10 tablet 0  . sucralfate (CARAFATE) 1 GM/10ML suspension Take 10 mLs (1 g total) by mouth 4 (four) times daily -  with meals and at bedtime. 420 mL 0  . hydrocortisone (ANUSOL-HC) 25 MG suppository Place 1 suppository (25 mg total) rectally 2 (two) times daily. (Patient not taking: Reported on 01/28/2015) 12 suppository 3  . pantoprazole sodium (PROTONIX) 40 mg/20 mL PACK Place 20 mLs (40 mg total) into feeding tube daily. (Patient not taking: Reported on 01/28/2015) 30 each 0     Allergies: No Known Allergies   Social History  Substance Use Topics  . Smoking status: Former Smoker -- 0.20 packs/day for 1 years    Types: Cigarettes    Quit date: 11/26/2014  . Smokeless tobacco: Never Used     Comment: STOPPED SMOKING IN SEPTEMBER 2016  . Alcohol Use: 0.0 oz/week    0 Cans of beer per week      Family History  Problem Relation Age of Onset  . Heart disease Brother     arrhythmia; died  . Breast cancer Maternal Aunt       ROS:  Please see the history of present illness.   ll other systems reviewed and negative.   Vital Signs: Blood pressure 143/102, pulse 85, temperature 98.1 F (36.7 C), temperature source Oral, resp. rate 15, height  (1.676 m),  weight 160 lb (72.576 kg), SpO2 98 %.  PHYSICAL EXAM: General:  Well nourished, well developed female in no acute distress HEENT: normal Lymph: no adenopathy Neck: no JVD Endocrine:  No thryomegaly Vascular: No carotid bruits; FA pulses 2+ bilaterally without bruits Cardiac:  normal S1, S2; RRR; no murmur Back: without kyphosis/scoliosis, no CVA tenderness Lungs:  clear to auscultation bilaterally, no wheezing, rhonchi or rales Abd: soft, nontender, no hepatomegaly Ext: no edema Musculoskeletal:  No deformities, BUE and BLE strength normal and equal Skin: warm and dry Neuro:  CNs 2-12 intact, no focal abnormalities noted Psych:  Normal affect   EKG:  Sinus rhythm with normal intervals and poor R-wave progression in the anterior precordium and T-wave inversions V1-V3. This is unchanged from 10/16  Labs:   Lab Results  Component Value Date   WBC 4.3 01/28/2015   HGB 10.2* 01/28/2015   HCT 31.4* 01/28/2015   MCV 85.6 01/28/2015   PLT 194 01/28/2015    Recent Labs Lab 01/28/15 1514  NA 138  K 3.4*  CL 104  CO2 25  BUN <5*  CREATININE 0.56  CALCIUM 8.2*  GLUCOSE 106*   No results for input(s): CKTOTAL, CKMB, TROPONINI in the last 72 hours. Lab Results  Component Value Date   CHOL 200 02/20/2014   HDL 82 02/20/2014   LDLCALC 80 02/20/2014   TRIG 189* 02/20/2014   Lab Results  Component Value Date   DDIMER 0.51* 03/15/2014   BNP PRO B NATRIURETIC PEPTIDE (BNP)  Date/Time Value Ref Range Status  12/23/2011 04:27 PM 38.6 0 - 125 pg/mL Final       ASSESSMENT AND PLAN:   Chest PAIN  CAD-s/p MI  Apical Thrombus  ?ATIII deficiency  HTN  Her CP is atypical notwithstanding her prior Hx--the concern is heightened by the positive Tn but it is just borderline,so sequence of results will be helpful in informing next steps, myoview or urgent cath,    We will repeat Tn now,  myoview in am if Tn unrevealing    She needs outpt sleep study  She also prob needs  a formal eval for ATIII  Replete K

## 2015-01-28 NOTE — ED Provider Notes (Signed)
CSN: [id number]     Arrival date & time 01/28/15  1407 History   First MD Initiated Contact with Patient 01/28/15 1411     Chief Complaint  Patient presents with  . Chest Pain     (Consider location/radiation/quality/duration/timing/severity/associated sxs/prior Treatment) HPI   Patient is a 44 year old female with past medical history of stroke (residual short term memory loss), CAD (LAD stent), antithrombin III deficiency (on lovenox injections) presents to the ED with complaint of chest pain, onset 1 PM. Patient states while she was in the car driving to pick up her daughter she began having sudden onset midsternal squeezing chest pain that radiates to her back. CP does not change with palpation. She notes this pain is similar to pain she has had in the past when she had an MI. She also reports having diaphoresis, nausea and intermittent right arm numbness. Pt reports she took 3 SL nitro at home without any relief resulting in her calling EMS. She took one ASA PTA. Pt reports after getting 3 more SL nitro via EMS her CP improved from 8/10 to 7/10. She endorses having a HA s/p SL nitro. Denies fever, chills, lightheaded, dizziness, SOB, cough, wheezing, abdominal pain, vomiting, diarrhea, weakness, syncope.  Past Medical History  Diagnosis Date  . Stroke (HCC)     assoc with short term memory loss and right peripheral vision loss; age 19   . Blood transfusion   . Anxiety   . Coronary artery disease     Apical LAD infarction '00; NSTEMI s/p BMS to prox LAD '09; Cath 12/2011 single vessel CAD w/ patent LAD stent w/ stable mild ISR, nl LV systolic fxn  . Anemia   . Antithrombin III deficiency (HCC)     ?pt not sure if true diagnosis  . Tobacco abuse   . High blood pressure   . Myocardial infarction [facility])    Past Surgical History  Procedure Laterality Date  . Tubal ligation    . Cardiac catheterization    . Esophagogastroduodenoscopy N/A 06/09/2012    Procedure:  ESOPHAGOGASTRODUODENOSCOPY (EGD);  Surgeon: Theda Belfast, MD;  Location: [facility] ENDOSCOPY;  Service: Endoscopy;  Laterality: N/A;  . Coronary angioplasty    . Esophagogastroduodenoscopy N/A 08/02/2013    Procedure: ESOPHAGOGASTRODUODENOSCOPY (EGD);  Surgeon: Meryl Dare, MD;  Location: [facility] ENDOSCOPY;  Service: Endoscopy;  Laterality: N/A;  . Left heart catheterization with coronary angiogram N/A 12/24/2011    Procedure: LEFT HEART CATHETERIZATION WITH CORONARY ANGIOGRAM;  Surgeon: Kathleene Hazel, MD;  Location: [facility] CATH LAB;  Service: Cardiovascular;  Laterality: N/A;  . Colonoscopy N/A 03/29/2014    Procedure: COLONOSCOPY;  Surgeon: Louis Meckel, MD;  Location: [facility] ENDOSCOPY;  Service: Endoscopy;  Laterality: N/A;   Family History  Problem Relation Age of Onset  . Heart disease Brother     arrhythmia; died  . Breast cancer Maternal Aunt    Social History  Substance Use Topics  . Smoking status: Former Smoker -- 0.20 packs/day for 1 years    Types: Cigarettes    Quit date: 11/26/2014  . Smokeless tobacco: Never Used     Comment: STOPPED SMOKING IN SEPTEMBER 2016  . Alcohol Use: 0.0 oz/week    0 Cans of beer per week   OB History    No data available     Review of Systems  Constitutional: Positive for diaphoresis.  Cardiovascular: Positive for chest pain.  Neurological: Positive for numbness and headaches.  All other systems reviewed and are  negative.     Allergies  Review of patient's allergies indicates no known allergies.  Home Medications   Prior to Admission medications   Medication Sig Start Date End Date Taking? Authorizing Provider  acetaminophen (TYLENOL) 500 MG tablet Take 1,000 mg by mouth every 6 (six) hours as needed for headache (pain).   Yes Historical Provider, MD  atorvastatin (LIPITOR) 40 MG tablet Take 2 tablets (80 mg total) by mouth daily. Patient taking differently: Take 40 mg by mouth daily.  02/21/14 02/21/15 Yes Tasrif Ahmed, MD   clopidogrel (PLAVIX) 75 MG tablet Take 75 mg by mouth daily.   Yes Historical Provider, MD  enoxaparin (LOVENOX) 100 MG/ML injection Inject 1 mL (100 mg total) into the skin daily. Patient taking differently: Inject 100 mg into the skin daily at 6 PM.  12/29/14  Yes Ejiroghene E Emokpae, MD  hydrocortisone-pramoxine (PROCTOFOAM-HC) rectal foam Place 1 applicator rectally 2 (two) times daily. Patient taking differently: Place 1 applicator rectally 3 (three) times daily as needed for hemorrhoids.  06/15/14  Yes Devoria Albe, MD  metoprolol succinate (TOPROL XL) 25 MG 24 hr tablet Take 1 tablet (25 mg total) by mouth daily. 03/03/14  Yes Lewayne Bunting, MD  nitroGLYCERIN (NITROSTAT) 0.4 MG SL tablet Place 1 tablet (0.4 mg total) under the tongue every 5 (five) minutes as needed for chest pain (up to 3 doses). 10/14/13  Yes Scott T Alben Spittle, PA-C  ondansetron (ZOFRAN ODT) 4 MG disintegrating tablet Take 1 tablet (4 mg total) by mouth every 8 (eight) hours as needed for nausea or vomiting. 09/10/14  Yes Emilia Beck, PA-C  sucralfate (CARAFATE) 1 GM/10ML suspension Take 10 mLs (1 g total) by mouth 4 (four) times daily -  with meals and at bedtime. 12/22/14  Yes Darreld Mclean, MD  hydrocortisone (ANUSOL-HC) 25 MG suppository Place 1 suppository (25 mg total) rectally 2 (two) times daily. Patient not taking: Reported on 01/28/2015 03/30/14   Alexa Dulcy Fanny, MD  pantoprazole sodium (PROTONIX) 40 mg/20 mL PACK Place 20 mLs (40 mg total) into feeding tube daily. Patient not taking: Reported on 01/28/2015 12/22/14   Darreld Mclean, MD   BP 149/102 mmHg  Pulse 77  Temp(Src) 98.1 F (36.7 C) (Oral)  Resp 21  Ht  (1.676 m)  Wt 160 lb (72.576 kg)  BMI 25.84 kg/m2  SpO2 100% Physical Exam  Constitutional: She is oriented to person, place, and time. She appears well-developed and well-nourished. No distress.  HENT:  Head: Normocephalic and atraumatic.  Mouth/Throat: Oropharynx is clear and moist. No  oropharyngeal exudate.  Eyes: Conjunctivae and EOM are normal. Pupils are equal, round, and reactive to light. Right eye exhibits no discharge. Left eye exhibits no discharge. No scleral icterus.  Neck: Normal range of motion. Neck supple.  Cardiovascular: Normal rate, regular rhythm, normal heart sounds and intact distal pulses.   No murmur heard. Pulmonary/Chest: Effort normal and breath sounds normal. No respiratory distress. She has no wheezes. She has no rales. She exhibits no tenderness.  Abdominal: Soft. Bowel sounds are normal. She exhibits no distension and no mass. There is no tenderness. There is no rebound and no guarding.  Musculoskeletal: Normal range of motion. She exhibits no edema.  Lymphadenopathy:    She has no cervical adenopathy.  Neurological: She is alert and oriented to person, place, and time.  Skin: Skin is warm and dry. She is not diaphoretic.  Nursing note and vitals reviewed.   ED Course  .Critical Care Performed  by: NADEAU, NICOLE ELIZABETH Authorized by: Barrett Henle Total critical care time: 30 minutes Critical care time was exclusive of separately billable procedures and treating other patients. Critical care was necessary to treat or prevent imminent or life-threatening deterioration of the following conditions: NSTEMI. Critical care was time spent personally by me on the following activities: blood draw for specimens, development of treatment plan with patient or surrogate, discussions with consultants, interpretation of cardiac output measurements, evaluation of patient's response to treatment, examination of patient, obtaining history from patient or surrogate, ordering and performing treatments and interventions, ordering and review of laboratory studies, ordering and review of radiographic studies, pulse oximetry, re-evaluation of patient's condition and review of old charts (Heparin drip).   (including critical care time) Labs Review Labs  Reviewed  CBC - Abnormal; Notable for the following:    RBC 3.67 (*)    Hemoglobin 10.2 (*)    HCT 31.4 (*)    RDW 18.4 (*)    All other components within normal limits  BASIC METABOLIC PANEL - Abnormal; Notable for the following:    Potassium 3.4 (*)    Glucose, Bld 106 (*)    BUN <5 (*)    Calcium 8.2 (*)    All other components within normal limits  I-STAT TROPOININ, ED - Abnormal; Notable for the following:    Troponin i, poc 0.11 (*)    All other components within normal limits  I-STAT TROPOININ, ED - Abnormal; Notable for the following:    Troponin i, poc 0.11 (*)    All other components within normal limits  HEPARIN LEVEL (UNFRACTIONATED)  HEPARIN LEVEL (UNFRACTIONATED)  CBC    Imaging Review Dg Chest 2 View  01/28/2015  CLINICAL DATA:  Chest pain radiating into the right arm and back. Shortness of breath. Symptoms for approximately 2 hours. Initial encounter. EXAM: CHEST  2 VIEW COMPARISON:  PA and lateral chest and CT chest 12/20/2014. FINDINGS: The lungs are clear. Heart size is normal. No pneumothorax or pleural effusion. No focal bony abnormality. IMPRESSION: No acute disease. Electronically Signed   By: Drusilla Kanner M.D.   On: 01/28/2015 14:55   I have personally reviewed and evaluated these images and lab results as part of my medical decision-making.   EKG Interpretation   Date/Time:  Sunday January 28 2015 14:13:28 EST Ventricular Rate:  78 PR Interval:  142 QRS Duration: 77 QT Interval:  402 QTC Calculation: 458 R Axis:   45 Text Interpretation:  Sinus rhythm Anterior infarct, old Baseline wander  in lead(s) V2 ED PHYSICIAN INTERPRETATION AVAILABLE IN [facility]  Confirmed by TEST, Record ([id number]) on 01/28/2015 2:22:42 PM      MDM   Final diagnoses:  NSTEMI (non-ST elevated myocardial infarction) [facility])    Patient presents with sudden onset midsternal squeezing chest pain that radiates to her back with associated right arm numbness, nausea  and diaphoresis. History of CVA, antithrombin III deficiency (on Lovenox) and CAD (LAD stent in 2013). Patient reports no relief with 3 SL nitroglycerin at home. She took one aspirin at home. Mild relief of chest pain s/p 3 SL nitro by EMS. VSS. Exam unremarkable. EKG showed sinus rhythm, unchanged from prior. PERC negative. Pt given SL nitro, reports improved CP for appx. 5 min.  Trop 0.11. Orders placed for heparin drip, nitro paste and fentanyl. Consulted cardiology, they will come evaluate the pt in the ED. Consulted hospitalist, agree to admission. Orders placed for stepdown bed. Discussed results and plan for  admission with patient.   Satira Sark [place], [place] 01/28/15 1700  Laurence Spates, MD 01/31/15 [phone]

## 2015-01-28 NOTE — ED Notes (Signed)
To ED via [facility] from home with c/o chest pain. Midsternal, radiates to right arm and back. Pt received total of 6 NTG sl and 324 mg po. Pt pain does not change with palpation.

## 2015-01-28 NOTE — Progress Notes (Signed)
Pt is still having CP despite receiving Morphine, Nitro paste and heparin drip. She also vomited after receiving PO potassium and Carafate. BP 168/110 P 81 sat 100% on 2l O2 via nasal canula. MD was paged. Will continue to monitor.  Colleen Can, RN

## 2015-01-29 ENCOUNTER — Encounter (HOSPITAL_COMMUNITY)
Admission: EM | Disposition: A | Discharge status: Home / Self Care | Payer: Self-pay | Source: Home / Self Care | Attending: Internal Medicine

## 2015-01-29 DIAGNOSIS — I214 Non-ST elevation (NSTEMI) myocardial infarction: Principal | ICD-10-CM

## 2015-01-29 DIAGNOSIS — Z9861 Coronary angioplasty status: Secondary | ICD-10-CM

## 2015-01-29 DIAGNOSIS — I2510 Atherosclerotic heart disease of native coronary artery without angina pectoris: Secondary | ICD-10-CM

## 2015-01-29 DIAGNOSIS — D6859 Other primary thrombophilia: Secondary | ICD-10-CM

## 2015-01-29 DIAGNOSIS — R072 Precordial pain: Secondary | ICD-10-CM

## 2015-01-29 DIAGNOSIS — E785 Hyperlipidemia, unspecified: Secondary | ICD-10-CM

## 2015-01-29 DIAGNOSIS — K219 Gastro-esophageal reflux disease without esophagitis: Secondary | ICD-10-CM | POA: Diagnosis present

## 2015-01-29 DIAGNOSIS — I10 Essential (primary) hypertension: Secondary | ICD-10-CM

## 2015-01-29 HISTORY — PX: CARDIAC CATHETERIZATION: SHX172

## 2015-01-29 LAB — PROTIME-INR
INR: 1.15 (ref 0.00–1.49)
PROTHROMBIN TIME: 14.9 s (ref 11.6–15.2)

## 2015-01-29 LAB — HEPARIN LEVEL (UNFRACTIONATED)
HEPARIN UNFRACTIONATED: 0.18 [IU]/mL — AB (ref 0.30–0.70)
HEPARIN UNFRACTIONATED: 0.32 [IU]/mL (ref 0.30–0.70)

## 2015-01-29 LAB — CBC
HCT: 30.7 % — ABNORMAL LOW (ref 36.0–46.0)
Hemoglobin: 10 g/dL — ABNORMAL LOW (ref 12.0–15.0)
MCH: 28.7 pg (ref 26.0–34.0)
MCHC: 32.6 g/dL (ref 30.0–36.0)
MCV: 88 fL (ref 78.0–100.0)
PLATELETS: 184 10*3/uL (ref 150–400)
RBC: 3.49 MIL/uL — AB (ref 3.87–5.11)
RDW: 19.1 % — AB (ref 11.5–15.5)
WBC: 6.5 10*3/uL (ref 4.0–10.5)

## 2015-01-29 LAB — BASIC METABOLIC PANEL
ANION GAP: 8 (ref 5–15)
BUN: <5 mg/dL — ABNORMAL LOW (ref 6–20)
CHLORIDE: 104 mmol/L (ref 101–111)
CO2: 28 mmol/L (ref 22–32)
Calcium: 8.4 mg/dL — ABNORMAL LOW (ref 8.9–10.3)
Creatinine, Ser: 0.61 mg/dL (ref 0.44–1.00)
GFR calc Af Amer: >60 mL/min (ref >60)
GFR calc non Af Amer: >60 mL/min (ref >60)
GLUCOSE: 110 mg/dL — AB (ref 65–99)
POTASSIUM: 3.4 mmol/L — AB (ref 3.5–5.1)
Sodium: 140 mmol/L (ref 135–145)

## 2015-01-29 LAB — TROPONIN I
Troponin I: 0.34 ng/mL — ABNORMAL HIGH (ref <0.031)
Troponin I: 0.76 ng/mL (ref <0.031)

## 2015-01-29 LAB — MAGNESIUM: MAGNESIUM: 1.5 mg/dL — AB (ref 1.7–2.4)

## 2015-01-29 SURGERY — LEFT HEART CATH AND CORONARY ANGIOGRAPHY

## 2015-01-29 MED ORDER — SODIUM CHLORIDE 0.9 % IJ SOLN
3.0000 mL | Freq: Two times a day (BID) | INTRAMUSCULAR | Status: DC
  Administered 2015-01-31 – 2015-02-01 (×2): 3 mL via INTRAVENOUS

## 2015-01-29 MED ORDER — POTASSIUM CHLORIDE CRYS ER 20 MEQ PO TBCR
40.0000 meq | EXTENDED_RELEASE_TABLET | Freq: Once | ORAL | Status: AC
  Administered 2015-01-29: 40 meq via ORAL
  Filled 2015-01-29: qty 2

## 2015-01-29 MED ORDER — MIDAZOLAM HCL 2 MG/2ML IJ SOLN
INTRAMUSCULAR | Status: DC | PRN
  Administered 2015-01-29: 2 mg via INTRAVENOUS

## 2015-01-29 MED ORDER — SODIUM CHLORIDE 0.9 % IJ SOLN: 3.0000 mL | INTRAMUSCULAR | Status: DC | PRN

## 2015-01-29 MED ORDER — LIDOCAINE HCL (PF) 1 % IJ SOLN
INTRAMUSCULAR | Status: DC | PRN
  Administered 2015-01-29: 3 mL

## 2015-01-29 MED ORDER — HEPARIN SODIUM (PORCINE) 1000 UNIT/ML IJ SOLN
INTRAMUSCULAR | Status: DC | PRN
  Administered 2015-01-29: 4000 [IU] via INTRAVENOUS

## 2015-01-29 MED ORDER — ENOXAPARIN SODIUM 80 MG/0.8ML ~~LOC~~ SOLN
70.0000 mg | Freq: Two times a day (BID) | SUBCUTANEOUS | Status: DC
  Administered 2015-01-30 – 2015-01-31 (×5): 70 mg via SUBCUTANEOUS
  Filled 2015-01-29 (×5): qty 0.8

## 2015-01-29 MED ORDER — HEPARIN SODIUM (PORCINE) 1000 UNIT/ML IJ SOLN
INTRAMUSCULAR | Status: AC
  Filled 2015-01-29: qty 1

## 2015-01-29 MED ORDER — SODIUM CHLORIDE 0.9 % IV SOLN
INTRAVENOUS | Status: DC
  Administered 2015-01-29: 11:00:00 via INTRAVENOUS

## 2015-01-29 MED ORDER — FENTANYL CITRATE (PF) 100 MCG/2ML IJ SOLN
INTRAMUSCULAR | Status: DC | PRN
  Administered 2015-01-29: 25 ug via INTRAVENOUS

## 2015-01-29 MED ORDER — SODIUM CHLORIDE 0.9 % IJ SOLN: 3.0000 mL | Freq: Two times a day (BID) | INTRAMUSCULAR | Status: DC

## 2015-01-29 MED ORDER — SODIUM CHLORIDE 0.9 % IV SOLN: 250.0000 mL | INTRAVENOUS | Status: DC | PRN

## 2015-01-29 MED ORDER — MIDAZOLAM HCL 2 MG/2ML IJ SOLN
INTRAMUSCULAR | Status: AC
  Filled 2015-01-29: qty 4

## 2015-01-29 MED ORDER — LIDOCAINE HCL (PF) 1 % IJ SOLN
INTRAMUSCULAR | Status: AC
  Filled 2015-01-29: qty 30

## 2015-01-29 MED ORDER — AMLODIPINE BESYLATE 5 MG PO TABS
5.0000 mg | ORAL_TABLET | Freq: Every day | ORAL | Status: DC
  Administered 2015-01-29 – 2015-02-01 (×4): 5 mg via ORAL
  Filled 2015-01-29 (×4): qty 1

## 2015-01-29 MED ORDER — MAGNESIUM SULFATE 2 GM/50ML IV SOLN
2.0000 g | Freq: Once | INTRAVENOUS | Status: AC
  Administered 2015-01-29: 2 g via INTRAVENOUS
  Filled 2015-01-29: qty 50

## 2015-01-29 MED ORDER — ASPIRIN 81 MG PO CHEW
81.0000 mg | CHEWABLE_TABLET | ORAL | Status: AC
  Administered 2015-01-29: 81 mg via ORAL
  Filled 2015-01-29: qty 1

## 2015-01-29 MED ORDER — SODIUM CHLORIDE 0.9 % WEIGHT BASED INFUSION: 3.0000 mL/kg/h | INTRAVENOUS | Status: AC

## 2015-01-29 MED ORDER — HEPARIN BOLUS VIA INFUSION
2000.0000 [IU] | Freq: Once | INTRAVENOUS | Status: AC
  Administered 2015-01-29: 2000 [IU] via INTRAVENOUS
  Filled 2015-01-29: qty 2000

## 2015-01-29 MED ORDER — IOHEXOL 350 MG/ML SOLN
INTRAVENOUS | Status: DC | PRN
  Administered 2015-01-29: 100 mL via INTRAVENOUS

## 2015-01-29 MED ORDER — LIDOCAINE HCL (PF) 1 % IJ SOLN
INTRAMUSCULAR | Status: DC | PRN
  Administered 2015-01-29: 16:00:00

## 2015-01-29 MED ORDER — FENTANYL CITRATE (PF) 100 MCG/2ML IJ SOLN
INTRAMUSCULAR | Status: AC
  Filled 2015-01-29: qty 4

## 2015-01-29 MED ORDER — VERAPAMIL HCL 2.5 MG/ML IV SOLN
INTRAVENOUS | Status: DC | PRN
  Administered 2015-01-29: 16:00:00 via INTRA_ARTERIAL

## 2015-01-29 MED ORDER — HEPARIN (PORCINE) IN NACL 2-0.9 UNIT/ML-% IJ SOLN
INTRAMUSCULAR | Status: AC
  Filled 2015-01-29: qty 1500

## 2015-01-29 MED ORDER — VERAPAMIL HCL 2.5 MG/ML IV SOLN
INTRAVENOUS | Status: AC
  Filled 2015-01-29: qty 2

## 2015-01-29 SURGICAL SUPPLY — 11 items
CATH INFINITI 5 FR JL3.5 (CATHETERS) ×3 IMPLANT
CATH INFINITI 5FR ANG PIGTAIL (CATHETERS) ×3 IMPLANT
CATH INFINITI JR4 5F (CATHETERS) ×3 IMPLANT
DEVICE RAD COMP TR BAND LRG (VASCULAR PRODUCTS) ×3 IMPLANT
GLIDESHEATH SLEND SS 6F .021 (SHEATH) ×3 IMPLANT
KIT HEART LEFT (KITS) ×3 IMPLANT
PACK CARDIAC CATHETERIZATION (CUSTOM PROCEDURE TRAY) ×3 IMPLANT
SYR MEDRAD MARK V 150ML (SYRINGE) ×3 IMPLANT
TRANSDUCER W/STOPCOCK (MISCELLANEOUS) ×3 IMPLANT
TUBING CIL FLEX 10 FLL-RA (TUBING) ×3 IMPLANT
WIRE SAFE-T 1.5MM-J .035X260CM (WIRE) ×3 IMPLANT

## 2015-01-29 NOTE — Progress Notes (Signed)
  Cardiologist: Dr. Jens Som  Subjective:  Chest pain , pressure SSCP, worrisome to her. "Do not normally have high BP"  Objective:  Vital Signs in the last 24 hours: Temp:  [98.1 F (36.7 C)-98.7 F (37.1 C)] 98.5 F (36.9 C) (11/14 0800) Pulse Rate:  [70-107] 77 (11/14 0800) Resp:  [12-23] 16 (11/14 0800) BP: (139-168)/(95-120) 148/101 mmHg (11/14 0800) SpO2:  [96 %-100 %] 100 % (11/14 0800) Weight:  [160 lb (72.576 kg)-164 lb 8 oz (74.617 kg)] 163 lb (73.936 kg) (11/14 0431)  Intake/Output from previous day:     Physical Exam: General: Well developed, well nourished, in no acute distress. Head:  Normocephalic and atraumatic. Lungs: Clear to auscultation and percussion. Heart: Normal S1 and S2.  No murmur, rubs or gallops.  Abdomen: soft, non-tender, positive bowel sounds. Extremities: No clubbing or cyanosis. No edema. Neurologic: Alert and oriented x 3.    Lab Results:  Recent Labs  01/28/15 1514 01/29/15 0723  WBC 4.3 6.5  HGB 10.2* 10.0*  PLT 194 184    Recent Labs  01/28/15 1514 01/29/15 0723  NA 138 140  K 3.4* 3.4*  CL 104 104  CO2 25 28  GLUCOSE 106* 110*  BUN <5* <5*  CREATININE 0.56 0.61    Recent Labs  01/28/15 2329 01/29/15 0723  TROPONINI 0.34* 0.76*  Imaging: Dg Chest 2 View  01/28/2015  CLINICAL DATA:  Chest pain radiating into the right arm and back. Shortness of breath. Symptoms for approximately 2 hours. Initial encounter. EXAM: CHEST  2 VIEW COMPARISON:  PA and lateral chest and CT chest 12/20/2014. FINDINGS: The lungs are clear. Heart size is normal. No pneumothorax or pleural effusion. No focal bony abnormality. IMPRESSION: No acute disease. Electronically Signed   By: Drusilla Kanner M.D.   On: 01/28/2015 14:55   Personally viewed.   Telemetry: No adverse rhythms Personally viewed.   EKG:  NSR, NSSTW changes (TWI V2)  Cardiac Studies:  Prior no show for NUC Cath 2013 - patent LAD stent with mild ISR  Assessment/Plan:    Active Problems:   NSTEMI (non-ST elevated myocardial infarction) (HCC)  NSTEMI  - chest tightness (albeit atypical at times)  - Troponin from 0.2 to 0.7  - ECG NSSTW changes  - prior no show to NUC stress  - Heparin IV, Bb, Statin, ASA  - Will proceed with Cath - prior 2013 med mgt.   - Prior EF 50-55%  - Risks and benefits (CVA, MI, death, bleeding discussed)  Essential hypertension  - quite elevated on arrival  - no evidence of widened mediastinum  - will start amlodipine 5mg  POQD  - Try to wean NTG IV as BP improves  Prior AT III deficiency/ hypercoagulable state  - prior LV apex thrombus  - Dr. Jens Som recommend lifelong coumadin  - not currently taking (was on lovenox at one point)  - prior trouble with compliance.   - Dr. Graciela Husbands on original consult recommended hematology outpatient evaluation.  History of CVA    Lindsey [name] 01/29/2015, 9:09 AM

## 2015-01-29 NOTE — Progress Notes (Signed)
CRITICAL VALUE ALERT  Critical value received:  Trop 0.76  Date of notification:  01/29/15  Time of notification:  0810  Critical value read back:Yes.    Nurse who received alert:  Tammy Sours, RN  MD notified (1st page):  Allena Katz  Time of first page:  [phone]  MD notified (2nd page):  Time of second page:  Responding MD:  Allena Katz  Time MD responded:  [phone]

## 2015-01-29 NOTE — Progress Notes (Signed)
   Subjective: Patient had worsening chest pain overnight, started on Nitroglycerin infusion which provided some relief. She does have some continued sternal CP that is about 4/10.  Objective: Vital signs in last 24 hours: Filed Vitals:   01/29/15 1002 01/29/15 1032 01/29/15 1102 01/29/15 1131  BP: 150/105 138/98 127/96 131/95  Pulse: 74 71 82 72  Temp:    98.7 F (37.1 C)  TempSrc:    Oral  Resp: 10 14 19 18   Height:      Weight:      SpO2: 97% 100% 100% 97%   Weight change:   Intake/Output Summary (Last 24 hours) at 01/29/15 1350 Last data filed at 01/29/15 1101  Gross per 24 hour  Intake 209.54 ml  Output      0 ml  Net 209.54 ml   General: resting in bed, NAD Cardiac: RRR, no rubs, murmurs or gallops Pulm: clear to auscultation bilaterally, moving normal volumes of air Abd: soft, nontender, nondistended, BS present Ext: warm and well perfused, no pedal edema  Assessment/Plan: Active Problems:   Hyperlipemia   Essential hypertension   CAD S/P percutaneous coronary angioplasty   Antithrombin III deficiency (HCC)   NSTEMI (non-ST elevated myocardial infarction) (HCC)   Esophageal reflux  NSTEMI: Patient with chest pain that have mixed components of typical and atypical symptoms. Troponin trending up from 0.2 -->0.4-->0.76. Chest pain with some improvement on Nitroglycerin infusion started overnight. EKG with non-specific T-wave inversion at V2, NSR. -Cardiology following, appreciate recommendations -Cardiac Cath planned for today -IV Heparin -Metoprolol, Atorvastatin   GERD: Patient's symptoms also correlating with typical GERD symptoms. She is unsure if she has been taking her PPI at home. GI cocktail has provided some relief. -Protonox 40 mg daily -Sucralfate  HTN: Blood pressures improving at this time -Continue Metoprolol succinate 25 mg po daily -Monitor vitals  Antithrombin III deficiency, Hx of apical mural thrombus: Has been on Lovenox due to history of  pill dysphagia swallowing Coumadin. She was seen by cardiology on 10/26 and told to restart her Coumadin and to have INR checked, but it is questionable if she has done this. -Will need outpatient Hematology work up for AT III deficiency and to continue anticoagulation  Dispo: Disposition is deferred at this time, awaiting improvement of current medical problems.    The patient does have a current PCP (Ejiroghene Wendall Stade, MD) and does need an [facility] hospital follow-up appointment after discharge.    LOS: 1 day   Darreld Mclean, MD 01/29/2015, 1:50 PM

## 2015-01-29 NOTE — Progress Notes (Signed)
Patient seen and examined. Case d/w residents in detail.  HPI: 44 y/o female with PMH of CAD s/p MIs s/p BMS to LAD, Anti thrombin III deficiency on lovenox, apical mural thrombus,TIA, HTN who p/w CP * 1 day. CP was squeezing in nature, radiating to her mid back and right arm, constant, progressively worsening in intensity. She was started on heparin gtt and nitro gtt for persistent CP and mildly elevated troponin. Still had mild CP this AM.  Physical Exam: Gen: AAO*3, NAD CVS: RRR, normal heart sounds Lungs: CTA b/l Abd: soft, non tender, non distended, BS+ Ext: no edema  Assessment and Plan: 44 y/o female with persistent CP with mild troponin elevation concerning for NSTEMI. She is now s/p cath with non obstructive CAD and presence of likely L apical thrombus. Would c/w therapeutic lovenox for now and check 2 D ECHO.

## 2015-01-29 NOTE — Progress Notes (Addendum)
UR Completed Oneill Bais Graves-Bigelow, RN,BSN [phone]  

## 2015-01-29 NOTE — Interval H&P Note (Signed)
History and Physical Interval Note:  01/29/2015 3:28 PM  Lindsey [name]  has presented today for surgery, with the diagnosis of cp  The various methods of treatment have been discussed with the patient and family. After consideration of risks, benefits and other options for treatment, the patient has consented to  Procedure(s): Left Heart Cath and Coronary Angiography (N/A) as a surgical intervention .  The patient's history has been reviewed, patient examined, no change in status, stable for surgery.  I have reviewed the patient's chart and labs.  Questions were answered to the patient's satisfaction.    Cath Lab Visit (complete for each Cath Lab visit)  Clinical Evaluation Leading to the Procedure:   ACS: Yes.    Non-ACS:    Anginal Classification: CCS IV  Anti-ischemic medical therapy: Maximal Therapy (2 or more classes of medications)  Non-Invasive Test Results: No non-invasive testing performed  Prior CABG: No previous CABG       Theron Arista [facility] 01/29/2015 3:28 PM

## 2015-01-29 NOTE — Care Management Note (Addendum)
Case Management Note  Patient Details  Name: Lindsey [name] MRN: [id number] Date of Birth: Sep 08, 1970  Subjective/Objective:  Pt admitted for Nstemi- Plan for cardiac cath 01-29-15.                   Action/Plan: CM will continue to monitor for additional disposition needs.    Expected Discharge Date:  01/30/15               Expected Discharge Plan:  Home/Self Care  In-House Referral:  NA  Discharge planning Services  CM Consult  Post Acute Care Choice:   Home with Home Health Services Choice offered to:   Patient  DME Arranged:   NA DME Agency:   NA  HH Arranged:   HH RN HH Agency:   [facility]  Status of Service:  Completed Medicare Important Message Given:    Date Medicare IM Given:    Medicare IM give by:    Date Additional Medicare IM Given:    Additional Medicare Important Message give by:     If discussed at Long Length of Stay Meetings, dates discussed:    Additional Comments:01-30-15 1016 Tomi Bamberger, [place] [phone] Plan is for pt to d/c home with coumadin. Pt has Medicaid and cost should be no more than $3.00. Pt signed up with [facility]- pt will benefit from [facility] RN for medication management. CM did speak with pt and she wants to use [facility] for services. SOC to begin within 24-48 hrs of d/c. No further needs from CM at this time.    Gala Lewandowsky, RN 01/29/2015, 9:47 AM

## 2015-01-29 NOTE — H&P (View-Only) (Signed)
  Cardiologist: Dr. Crenshaw  Subjective:  Chest pain , pressure SSCP, worrisome to her. "Do not normally have high BP"  Objective:  Vital Signs in the last 24 hours: Temp:  [98.1 F (36.7 C)-98.7 F (37.1 C)] 98.5 F (36.9 C) (11/14 0800) Pulse Rate:  [70-107] 77 (11/14 0800) Resp:  [12-23] 16 (11/14 0800) BP: (139-168)/(95-120) 148/101 mmHg (11/14 0800) SpO2:  [96 %-100 %] 100 % (11/14 0800) Weight:  [160 lb (72.576 kg)-164 lb 8 oz (74.617 kg)] 163 lb (73.936 kg) (11/14 0431)  Intake/Output from previous day:     Physical Exam: General: Well developed, well nourished, in no acute distress. Head:  Normocephalic and atraumatic. Lungs: Clear to auscultation and percussion. Heart: Normal S1 and S2.  No murmur, rubs or gallops.  Abdomen: soft, non-tender, positive bowel sounds. Extremities: No clubbing or cyanosis. No edema. Neurologic: Alert and oriented x 3.    Lab Results:  Recent Labs  01/28/15 1514 01/29/15 0723  WBC 4.3 6.5  HGB 10.2* 10.0*  PLT 194 184    Recent Labs  01/28/15 1514 01/29/15 0723  NA 138 140  K 3.4* 3.4*  CL 104 104  CO2 25 28  GLUCOSE 106* 110*  BUN <5* <5*  CREATININE 0.56 0.61    Recent Labs  01/28/15 2329 01/29/15 0723  TROPONINI 0.34* 0.76*  Imaging: Dg Chest 2 View  01/28/2015  CLINICAL DATA:  Chest pain radiating into the right arm and back. Shortness of breath. Symptoms for approximately 2 hours. Initial encounter. EXAM: CHEST  2 VIEW COMPARISON:  PA and lateral chest and CT chest 12/20/2014. FINDINGS: The lungs are clear. Heart size is normal. No pneumothorax or pleural effusion. No focal bony abnormality. IMPRESSION: No acute disease. Electronically Signed   By: Thomas  Dalessio M.D.   On: 01/28/2015 14:55   Personally viewed.   Telemetry: No adverse rhythms Personally viewed.   EKG:  NSR, NSSTW changes (TWI V2)  Cardiac Studies:  Prior no show for NUC Cath 2013 - patent LAD stent with mild ISR  Assessment/Plan:    Active Problems:   NSTEMI (non-ST elevated myocardial infarction) (HCC)  NSTEMI  - chest tightness (albeit atypical at times)  - Troponin from 0.2 to 0.7  - ECG NSSTW changes  - prior no show to NUC stress  - Heparin IV, Bb, Statin, ASA  - Will proceed with Cath - prior 2013 med mgt.   - Prior EF 50-55%  - Risks and benefits (CVA, MI, death, bleeding discussed)  Essential hypertension  - quite elevated on arrival  - no evidence of widened mediastinum  - will start amlodipine 5mg POQD  - Try to wean NTG IV as BP improves  Prior AT III deficiency/ hypercoagulable state  - prior LV apex thrombus  - Dr. Crenshaw recommend lifelong coumadin  - not currently taking (was on lovenox at one point)  - prior trouble with compliance.   - Dr. Klein on original consult recommended hematology outpatient evaluation.  History of CVA    Lindsey [name] 01/29/2015, 9:09 AM     

## 2015-01-29 NOTE — Progress Notes (Signed)
ANTICOAGULATION CONSULT NOTE  Pharmacy Consult for heparin > enoxaparin  Indication: LV thrombus  No Known Allergies  Patient Measurements: Height: 5\' 6"  (167.6 cm) Weight: 163 lb (73.936 kg) IBW/kg (Calculated) : 59.3 Heparin Dosing Weight: 72.6 kg  Vital Signs: Temp: 98.7 F (37.1 C) (11/14 1131) Temp Source: Oral (11/14 1131) BP: 134/86 mmHg (11/14 1650) Pulse Rate: 81 (11/14 1650)  Labs:  Recent Labs  01/28/15 1514 01/28/15 1913 01/28/15 2329 01/29/15 0723 01/29/15 1505  HGB 10.2*  --   --  10.0*  --   HCT 31.4*  --   --  30.7*  --   PLT 194  --   --  184  --   LABPROT  --   --   --   --  14.9  INR  --   --   --   --  1.15  HEPARINUNFRC  --   --  0.11* 0.18* 0.32  CREATININE 0.56  --   --  0.61  --   TROPONINI  --  0.20* 0.34* 0.76*  --     Estimated Creatinine Clearance: 90.3 mL/min (by C-G formula based on Cr of 0.61).  Assessment: 44 y.o. female with chest pain went to cath lab found non-obstructive CAD and new LV thrombus.  Hx of apical thrombus (not seen on ECHO 2015).  She was on enoxaparin PTA 1/5mg /kg q24 for hx ATIII deficiency.  Enoxaparin to be restarted post cath - with new thrombus will change dose to 1mg /kg q12 as this was dosing used for other indications except for DVT.  CBC stable, renal function stable.   Goal of Therapy:  Heparin level 0.3-0.7 units/ml Monitor platelets by anticoagulation protocol: Yes   Plan:   Sheath out 1600 Enoxaparin  1mg /kg q12 to start at midnight Monitor s/s bleeding F/u any plans for po AC  Leota Sauers Pharm.D. CPP, BCPS Clinical Pharmacist [phone] 01/29/2015 5:07 PM

## 2015-01-30 ENCOUNTER — Encounter: Payer: Medicaid Other | Admitting: Internal Medicine

## 2015-01-30 ENCOUNTER — Ambulatory Visit (HOSPITAL_COMMUNITY): Payer: Medicaid Other

## 2015-01-30 ENCOUNTER — Encounter (HOSPITAL_COMMUNITY): Payer: Self-pay | Admitting: Cardiology

## 2015-01-30 DIAGNOSIS — D6859 Other primary thrombophilia: Secondary | ICD-10-CM

## 2015-01-30 DIAGNOSIS — I25119 Atherosclerotic heart disease of native coronary artery with unspecified angina pectoris: Secondary | ICD-10-CM

## 2015-01-30 DIAGNOSIS — R0789 Other chest pain: Secondary | ICD-10-CM

## 2015-01-30 DIAGNOSIS — Z8673 Personal history of transient ischemic attack (TIA), and cerebral infarction without residual deficits: Secondary | ICD-10-CM

## 2015-01-30 DIAGNOSIS — I213 ST elevation (STEMI) myocardial infarction of unspecified site: Secondary | ICD-10-CM

## 2015-01-30 DIAGNOSIS — I214 Non-ST elevation (NSTEMI) myocardial infarction: Secondary | ICD-10-CM

## 2015-01-30 LAB — CBC
HCT: 30.4 % — ABNORMAL LOW (ref 36.0–46.0)
HEMOGLOBIN: 9.3 g/dL — AB (ref 12.0–15.0)
MCH: 27.2 pg (ref 26.0–34.0)
MCHC: 30.6 g/dL (ref 30.0–36.0)
MCV: 88.9 fL (ref 78.0–100.0)
PLATELETS: 187 10*3/uL (ref 150–400)
RBC: 3.42 MIL/uL — ABNORMAL LOW (ref 3.87–5.11)
RDW: 18.7 % — ABNORMAL HIGH (ref 11.5–15.5)
WBC: 5.9 10*3/uL (ref 4.0–10.5)

## 2015-01-30 LAB — MAGNESIUM: MAGNESIUM: 1.9 mg/dL (ref 1.7–2.4)

## 2015-01-30 MED ORDER — WARFARIN - PHARMACIST DOSING INPATIENT
Freq: Every day | Status: DC
  Administered 2015-01-30: 17:00:00

## 2015-01-30 MED ORDER — WARFARIN SODIUM 10 MG PO TABS
10.0000 mg | ORAL_TABLET | Freq: Once | ORAL | Status: AC
  Administered 2015-01-30: 10 mg via ORAL
  Filled 2015-01-30: qty 1

## 2015-01-30 MED ORDER — RANOLAZINE ER 500 MG PO TB12
500.0000 mg | ORAL_TABLET | Freq: Two times a day (BID) | ORAL | Status: DC
  Administered 2015-01-30 – 2015-02-01 (×5): 500 mg via ORAL
  Filled 2015-01-30 (×5): qty 1

## 2015-01-30 MED ORDER — ISOSORBIDE MONONITRATE ER 30 MG PO TB24
15.0000 mg | ORAL_TABLET | Freq: Every day | ORAL | Status: DC
  Administered 2015-01-30 – 2015-02-01 (×3): 15 mg via ORAL
  Filled 2015-01-30 (×3): qty 1

## 2015-01-30 MED ORDER — PERFLUTREN LIPID MICROSPHERE
1.0000 mL | INTRAVENOUS | Status: AC | PRN
  Filled 2015-01-30: qty 10

## 2015-01-30 NOTE — Progress Notes (Signed)
CARDIAC REHAB PHASE I   PRE:  Rate/Rhythm: 77 SR  BP:  Supine:   Sitting: 128/87  Standing:    SaO2:   MODE:  Ambulation: 510 ft   POST:  Rate/Rhythm: 84 SR  BP:  Supine:   Sitting: 122/89  Standing:    SaO2:  1040-1130 Wrote order for Phase 1 since pt NSTEMI. Pt walked 510 ft with steady gait. Pt was having chest discomfort 3-4/10 which did not worsen with walk. Pt glad to be up. MI education reviewed with pt. Discussed CRP 2 and will refer to [place] . Pt has attended before. Congratulated pt as she has quit smoking two months now. Gave ex ed but encouraged pt to listen to body and adhere to endpoints for ex and notify cardiologist if she has increased chest discomfort. Pt trying to adhere to heart healthy diet.   Luetta Nutting, RN BSN  01/30/2015 11:29 AM

## 2015-01-30 NOTE — Progress Notes (Signed)
ANTICOAGULATION CONSULT NOTE  Pharmacy Consult for heparin > enoxaparin add Warfarin Indication: LV thrombus  No Known Allergies  Patient Measurements: Height: 5\' 6"  (167.6 cm) Weight: 165 lb 12.8 oz (75.206 kg) IBW/kg (Calculated) : 59.3 Heparin Dosing Weight: 72.6 kg  Vital Signs: Temp: 98.8 F (37.1 C) (11/15 1435) Temp Source: Oral (11/15 1435) BP: 104/69 mmHg (11/15 1435) Pulse Rate: 94 (11/15 1435)  Labs:  Recent Labs  01/28/15 1514 01/28/15 1913 01/28/15 2329 01/29/15 0723 01/29/15 1505 01/30/15 0416  HGB 10.2*  --   --  10.0*  --  9.3*  HCT 31.4*  --   --  30.7*  --  30.4*  PLT 194  --   --  184  --  187  LABPROT  --   --   --   --  14.9  --   INR  --   --   --   --  1.15  --   HEPARINUNFRC  --   --  0.11* 0.18* 0.32  --   CREATININE 0.56  --   --  0.61  --   --   TROPONINI  --  0.20* 0.34* 0.76*  --   --     Estimated Creatinine Clearance: 91.1 mL/min (by C-G formula based on Cr of 0.61).  Assessment: 44 y.o. female with chest pain went to cath lab found non-obstructive CAD and new LV thrombus.  Hx of apical thrombus (not seen on ECHO 2015).  She was on enoxaparin PTA 1/5mg /kg q24 for hx ATIII deficiency.  Enoxaparin was restarted post cath - with new thrombus will change dose to 1mg /kg q12 as this was dosing used for other indications except for DVT.  CBC stable, renal function stable.   Restart warfarin today - previous dose was 7.5mg  daily.    Goal of Therapy:  Heparin level 0.3-0.7 units/ml Monitor platelets by anticoagulation protocol: Yes   Plan:  Enoxaparin  1mg /kg  = 70mg  q12 Warfarin 10mg  x1 Daily Protime Monitor s/s bleeding   Leota Sauers Pharm.D. CPP, BCPS Clinical Pharmacist [phone] 01/30/2015 4:05 PM

## 2015-01-30 NOTE — Progress Notes (Signed)
Patient seen and examined. Case d/w residents in detail. I agree with findings and plan as documented in Dr. Eliane Decree note.  Patient feels better. Decreased CP. No sob. Cath report reviewed - non obstructive CAD with possible L apical thrombus. Will f/u 2 D ECHO. C/w lovenox for therapeutic a/c. Start coumadin today. Cardio recommendations appreciated. Started on imdur and ranexa - possible microvascular angina.   Patient with questionable history of AT III deficiency. Would recheck AT III.

## 2015-01-30 NOTE — Plan of Care (Signed)
Problem: Phase III Progression Outcomes Goal: No anginal pain Outcome: Progressing Pt pain level has decreased throughout the shift

## 2015-01-30 NOTE — Progress Notes (Signed)
  Echocardiogram 2D Echocardiogram has been performed.  Arvil Chaco 01/30/2015, 3:37 PM

## 2015-01-30 NOTE — Progress Notes (Signed)
   Subjective: Patient says she is doing well, still with sternal chest pain that is improving compared to the last few days. She is happy to have some food and able to eat well. She had headaches with the Nitroglycerin drip, which resolved with Tylenol.  Objective: Vital signs in last 24 hours: Filed Vitals:   01/30/15 0321 01/30/15 0400 01/30/15 0720 01/30/15 1435  BP: 118/87  121/88 104/69  Pulse: 86  82 94  Temp:  98.9 F (37.2 C)  98.8 F (37.1 C)  TempSrc:  Oral  Oral  Resp:    18  Height:      Weight:  165 lb 12.8 oz (75.206 kg)    SpO2: 98%  98% 100%   Weight change: 5 lb 12.8 oz (2.631 kg)  Intake/Output Summary (Last 24 hours) at 01/30/15 1542 Last data filed at 01/30/15 1445  Gross per 24 hour  Intake 2239.32 ml  Output      0 ml  Net 2239.32 ml   General: resting in bed, pleasant, NAD Cardiac: RRR, no rubs, murmurs or gallops Pulm: clear to auscultation bilaterally, moving normal volumes of air Abd: soft, nontender, nondistended, BS present Ext: warm and well perfused, no pedal edema  Assessment/Plan: Principal Problem:   Chest pain Active Problems:   Difficulty swallowing pills   History of ischemic left PCA stroke   Hyperlipemia   Essential hypertension   CAD S/P  BMS to proximal LAD 2009-patent 01/29/15   Antithrombin III deficiency (HCC)   Apical mural thrombus (HCC)   Long term current use of anticoagulant therapy   NSTEMI (non-ST elevated myocardial infarction) (HCC)   Esophageal reflux  NSTEMI: Patient with chest pain that have mixed components of typical and atypical symptoms. Troponin trended up from 0.2 -->0.4-->0.76. Chest pain with some improvement on Nitroglycerin, but did have HA as side effect. Cardiac cath yesterday showed 20% stenosis to the RCA and LAD. Cath also showing a LV apex thrombus. She has history of prior apical thrombus years ago which was not seen on Echo from May 2015. -Cardiology following, appreciate recommendations -TTE  today -Started on Lovenox at midnight, will start oral anticoagulation with Coumadin -Continue Metoprolol 25 mg daily, Atorvastatin 40 mg daily -Started on Imdur 15 mg po daily, Ranolazine 500 mg po BID  GERD: Patient's symptoms also correlating with typical GERD symptoms. She is unsure if she has been taking her PPI at home. GI cocktail has provided some relief. -Protonox 40 mg daily -Sucralfate  HTN: Blood pressures improving at this time -Continue Metoprolol succinate 25 mg po daily -Monitor vitals  Apical Mural Thrombus likely 2/2 Antithrombin III deficiency: Has been on Lovenox due to history of pill dysphagia swallowing Coumadin. She was seen by cardiology on 10/26 and told to restart her Coumadin and to have INR checked. She was continuing to use Lovenox as she did not want to waste it due to its cost. Her swallowing function is improved and she feels comfortable taking pills at this time. Will likely need long-term Coumadin therapy with INR goal of 2-3. -Start Coumadin with Lovenox bridge -Will need outpatient Hematology work up for AT III deficiency  Dispo: Disposition is deferred at this time, awaiting improvement of current medical problems.    The patient does have a current PCP (Ejiroghene Wendall Stade, MD) and does need an [facility] hospital follow-up appointment after discharge.    LOS: 2 days   Darreld Mclean, MD 01/30/2015, 3:42 PM

## 2015-01-30 NOTE — Progress Notes (Signed)
Subjective:  She still has chest pain, mid sternal, non radiating, not pleuritic. NTG seems to help.  Objective:  Vital Signs in the last 24 hours: Temp:  [98.1 F (36.7 C)-98.9 F (37.2 C)] 98.9 F (37.2 C) (11/15 0400) Pulse Rate:  [0-90] 82 (11/15 0720) Resp:  [0-19] 17 (11/14 1947) BP: (116-153)/(81-105) 121/88 mmHg (11/15 0720) SpO2:  [0 %-100 %] 98 % (11/15 0720) Weight:  [165 lb 12.8 oz (75.206 kg)] 165 lb 12.8 oz (75.206 kg) (11/15 0400)  Intake/Output from previous day:  Intake/Output Summary (Last 24 hours) at 01/30/15 0830 Last data filed at 01/30/15 0647  Gross per 24 hour  Intake 1848.86 ml  Output      0 ml  Net 1848.86 ml    Physical Exam: General appearance: alert, cooperative, no distress and mildly obese Neck: no JVD Lungs: clear to auscultation bilaterally Heart: regular rate and rhythm Skin: Skin color, texture, turgor normal. No rashes or lesions Neurologic: Grossly normal   Rate: 78  Rhythm: normal sinus rhythm  Lab Results:  Recent Labs  01/29/15 0723 01/30/15 0416  WBC 6.5 5.9  HGB 10.0* 9.3*  PLT 184 187    Recent Labs  01/28/15 1514 01/29/15 0723  NA 138 140  K 3.4* 3.4*  CL 104 104  CO2 25 28  GLUCOSE 106* 110*  BUN <5* <5*  CREATININE 0.56 0.61    Recent Labs  01/28/15 2329 01/29/15 0723  TROPONINI 0.34* 0.76*    Recent Labs  01/29/15 1505  INR 1.15    Scheduled Meds: . amLODipine  5 mg Oral Daily  . atorvastatin  40 mg Oral Daily  . clopidogrel  75 mg Oral Daily  . enoxaparin (LOVENOX) injection  70 mg Subcutaneous Q12H  . metoprolol succinate  25 mg Oral Daily  . pantoprazole  40 mg Oral Daily  . sodium chloride  3 mL Intravenous Q12H  . sodium chloride  3 mL Intravenous Q12H  . sucralfate  1 g Oral TID WC & HS   Continuous Infusions: . nitroGLYCERIN 5 mcg/min (01/30/15 0647)   PRN Meds:.sodium chloride, acetaminophen, hydrocortisone-pramoxine, morphine injection, sodium  chloride   Imaging: Dg Chest 2 View  01/28/2015  CLINICAL DATA:  Chest pain radiating into the right arm and back. Shortness of breath. Symptoms for approximately 2 hours. Initial encounter. EXAM: CHEST  2 VIEW COMPARISON:  PA and lateral chest and CT chest 12/20/2014. FINDINGS: The lungs are clear. Heart size is normal. No pneumothorax or pleural effusion. No focal bony abnormality. IMPRESSION: No acute disease. Electronically Signed   By: Drusilla Kanner M.D.   On: 01/28/2015 14:55    Cardiac Studies: Echo pending  Assessment/Plan       44 y/o AA female with a history of CAD, s/p BMS to LAD in 2009, prior CVA,  Anti-thrombin III deficiency, HTN, and HL. Admitted in 07/2013 with pancreatitis. Echo then demonstrated apical mural thrombus. Placed on coumadin. ASA and Plavix were d/c'd. It was recommended that she be on Coumadin for life with prior CVA and history of antithrombin 3 deficiency.  She was admitted earlier in Oct 2016 with nausea and vomiting and trouble swallowing her pills. GI work up was unremarkable. We saw her in f/u 01/29/15 and she was doing well, no chest pain and able to taker her pills. . Dr Excell Seltzer had recommended an OP Myoview but the pt never had this done. NOAC Rx was discussed with our pharmacist. There is no indication for  NOAC Rx with LVT, only PAF, DVT, PE.  We suggested she resume her Coumadin.           She was admitted again 01/28/15 with chest pain and ruled in for a NSTEMI with pk Troponin of 0.76. She admits she had not resumed her Coumadin yet- "I wanted to use up my Lovenox". Cath done 01/29/15- mild CAD (20% LAD and RCA) with chronic apical AK and apical thrombus. Echo has been ordered.   Principal Problem:   Chest pain Active Problems:   NSTEMI (non-ST elevated myocardial infarction) (HCC)   Essential hypertension   CAD S/P  BMS to proximal LAD 2009-patent 01/29/15   Antithrombin III deficiency (HCC)   Apical mural thrombus (HCC)   Long  term current use of anticoagulant therapy   Hyperlipemia   History of ischemic left PCA stroke   Difficulty swallowing pills   Esophageal reflux   PLAN: Difficult situation in a pt with clotting disorder with persistent chest pain and recent negative GI w/u and no significant CAD at cath but elevated Troponin. ? Micro vascular angina. She is on Norvasc and IV NTG and still having chest pain- NTG had to be cut back secondary to head ache. Try Ranexa.  She is currently on ASA, Plavix, and full dose Lovenox. ? Resume Coumadin crossover.   Corine Shelter PA-C 01/30/2015, 8:30 AM [phone]  Personally seen and examined. Agree with above. Bizarre apical thrombus seen on LV gram during cath. Ranexa added as well as low dose Imdur HA with IV NTG NEEDS anticoagulation Coumadin - [facility] Lovenox bridge Prior GI work up unremarkable - continue Protonix  OK with DC after ECHO done.   Donato Schultz, MD

## 2015-01-31 ENCOUNTER — Inpatient Hospital Stay (HOSPITAL_COMMUNITY): Payer: Medicaid Other

## 2015-01-31 DIAGNOSIS — I213 ST elevation (STEMI) myocardial infarction of unspecified site: Secondary | ICD-10-CM

## 2015-01-31 DIAGNOSIS — Z7901 Long term (current) use of anticoagulants: Secondary | ICD-10-CM

## 2015-01-31 LAB — PROTIME-INR
INR: 1.03 (ref 0.00–1.49)
Prothrombin Time: 13.7 seconds (ref 11.6–15.2)

## 2015-01-31 LAB — BASIC METABOLIC PANEL
ANION GAP: 8 (ref 5–15)
BUN: <5 mg/dL — ABNORMAL LOW (ref 6–20)
CO2: 24 mmol/L (ref 22–32)
Calcium: 8.5 mg/dL — ABNORMAL LOW (ref 8.9–10.3)
Chloride: 105 mmol/L (ref 101–111)
Creatinine, Ser: 0.7 mg/dL (ref 0.44–1.00)
GFR calc Af Amer: >60 mL/min (ref >60)
GLUCOSE: 126 mg/dL — AB (ref 65–99)
POTASSIUM: 3.8 mmol/L (ref 3.5–5.1)
Sodium: 137 mmol/L (ref 135–145)

## 2015-01-31 MED ORDER — WARFARIN SODIUM 10 MG PO TABS
10.0000 mg | ORAL_TABLET | Freq: Once | ORAL | Status: AC
  Administered 2015-01-31: 10 mg via ORAL
  Filled 2015-01-31: qty 1

## 2015-01-31 MED ORDER — ZOLPIDEM TARTRATE 5 MG PO TABS
5.0000 mg | ORAL_TABLET | Freq: Once | ORAL | Status: AC
  Administered 2015-01-31: 5 mg via ORAL
  Filled 2015-01-31 (×2): qty 1

## 2015-01-31 MED ORDER — GADOBENATE DIMEGLUMINE 529 MG/ML IV SOLN
25.0000 mL | Freq: Once | INTRAVENOUS | Status: AC | PRN
  Administered 2015-01-31: 25 mL via INTRAVENOUS

## 2015-01-31 NOTE — Progress Notes (Signed)
   Subjective: Patient says she is feeling well, chest pain improved. She is eating well. Objective: Vital signs in last 24 hours: Filed Vitals:   01/31/15 0400 01/31/15 0729 01/31/15 0947 01/31/15 1143  BP: 102/72 125/86 106/69   Pulse: 93 70    Temp: 98.2 F (36.8 C) 98.6 F (37 C)  98.9 F (37.2 C)  TempSrc: Oral Oral  Oral  Resp: 18 14    Height:      Weight: 168 lb 6.4 oz (76.386 kg)     SpO2: 100% 100%     Weight change: 2 lb 9.6 oz (1.179 kg)  Intake/Output Summary (Last 24 hours) at 01/31/15 1310 Last data filed at 01/31/15 0854  Gross per 24 hour  Intake   1320 ml  Output    200 ml  Net   1120 ml   General: resting in bed, pleasant, NAD Cardiac: RRR, no rubs, murmurs or gallops Pulm: clear to auscultation bilaterally, moving normal volumes of air Abd: soft, nontender, nondistended, BS present Ext: warm and well perfused, no pedal edema  Assessment/Plan: Principal Problem:   Chest pain Active Problems:   Difficulty swallowing pills   History of ischemic left PCA stroke   Hyperlipemia   Essential hypertension   CAD S/P  BMS to proximal LAD 2009-patent 01/29/15   Antithrombin III deficiency (HCC)   Apical mural thrombus (HCC)   Long term current use of anticoagulant therapy   NSTEMI (non-ST elevated myocardial infarction) (HCC)   Esophageal reflux  NSTEMI: Patient with chest pain that have mixed components of typical and atypical symptoms. Troponin trended up from 0.2 -->0.4-->0.76. Chest pain with some improvement on Nitroglycerin, but did have HA as side effect. Cardiac cath yesterday showed 20% stenosis to the RCA and LAD. Cath also showing a LV apex thrombus. She has history of prior apical thrombus. -Cardiology following, appreciate recommendations -Lovenox, oral anticoagulation with Coumadin -Continue Metoprolol 25 mg daily, Atorvastatin 40 mg daily -Imdur 15 mg po daily, Ranolazine 500 mg po BID  Apical Mural Thrombus likely 2/2 Antithrombin III  deficiency: TTE shows apical thrombus. Needing longterm AC. It is likely that her thrombus is related to her ATIII deficiency, but will pursue further imaging for possibility that her thrombus may be from some other underlying process such as sarcoma. -Cardiac MRI -Coumadin with Lovenox bridge -Will need outpatient Hematology work up for AT III deficiency  GERD: Patient's symptoms also correlating with typical GERD symptoms. She is unsure if she has been taking her PPI at home. GI cocktail has provided some relief. -Protonox 40 mg daily -Sucralfate  HTN: Blood pressures improving at this time -Continue Metoprolol succinate 25 mg po daily -Monitor vitals    Dispo: Disposition is deferred at this time, awaiting improvement of current medical problems.    The patient does have a current PCP (Ejiroghene Wendall Stade, MD) and does need an [facility] hospital follow-up appointment after discharge.    LOS: 3 days   Darreld Mclean, MD 01/31/2015, 1:10 PM

## 2015-01-31 NOTE — Progress Notes (Signed)
ANTICOAGULATION CONSULT NOTE   Pharmacy Consult for enoxaparin and Warfarin Indication: LV thrombus  No Known Allergies  Patient Measurements: Height: 5\' 6"  (167.6 cm) Weight: 168 lb 6.4 oz (76.386 kg) IBW/kg (Calculated) : 59.3  Vital Signs: Temp: 98.6 F (37 C) (11/16 0729) Temp Source: Oral (11/16 0729) BP: 125/86 mmHg (11/16 0729) Pulse Rate: 70 (11/16 0729)  Labs:  Recent Labs  01/28/15 1514 01/28/15 1913 01/28/15 2329 01/29/15 0723 01/29/15 1505 01/30/15 0416 01/31/15 0400  HGB 10.2*  --   --  10.0*  --  9.3*  --   HCT 31.4*  --   --  30.7*  --  30.4*  --   PLT 194  --   --  184  --  187  --   LABPROT  --   --   --   --  14.9  --  13.7  INR  --   --   --   --  1.15  --  1.03  HEPARINUNFRC  --   --  0.11* 0.18* 0.32  --   --   CREATININE 0.56  --   --  0.61  --   --  0.70  TROPONINI  --  0.20* 0.34* 0.76*  --   --   --     Estimated Creatinine Clearance: 91.7 mL/min (by C-G formula based on Cr of 0.7).  Assessment: 44 y.o. female with chest pain went to cath lab found non-obstructive CAD and new LV thrombus. She does have hx of apical thrombus, but it was not seen on ECHO 2015.   She was on enoxaparin PTA 1.5mg /kg q24 for hx ATIII deficiency.  Enoxaparin was restarted post cath - with new thrombus, dose was changed to 1mg /kg q12 as this dosing regimen was studied in other conditions besides DVT.  She was also restarted on her warfarin last evening. Previous dose PTA was 7.5mg  daily.  INR 1.03. CBC low but stable, no bleeding noted.    Goal of Therapy:  INR 2-3 Anti-Xa level 0.6-1 units/ml 4hrs after LMWH dose given Monitor platelets by anticoagulation protocol: Yes   Plan:  -Enoxaparin 70mg  subQ q12 -Warfarin 10mg  x1 -CBC q72h, daily INR -Monitor s/s bleeding  Merryn Thaker D. Winton Offord, PharmD, BCPS Clinical Pharmacist Pager: [phone] 01/31/2015 8:48 AM

## 2015-01-31 NOTE — Progress Notes (Signed)
Patient ID: Lindsey [name], female   DOB: 27-Feb-1971, 44 y.o.   MRN: [id number] Medicine attending: I examined this patient together with resident physician Dr. Darreld Mclean and we discussed a management plan. This is a most unusual situation with apparent recurrent apical left ventricular thrombus. Fortunately, she has not suffered any embolic events related to this although there is a history of a stroke at age 60. She is swallowing better and we will start her back on Coumadin at this time. In reviewing evaluation to date, it appears that she did have a small PFO on a TEE but this is likely to be unrelated to the LV clot. I would like to get a cardiac MRI for better definition of this mass to rule out sarcoma or fibro-elastoma. There is an area of apical akinesis/aneurysm seen on echocardiogram from previous myocardial damage/MI and 2009. which may be at the root of the problem with respect to stasis of blood and clot formation in this area. With respect for other risk factors related to recurrent clotting, antithrombin III deficiency is extremely rare. I doubt that she has it. Cardiac thrombus would be a distinctly unusual association with this congenital coagulopathy. We are going to repeat an antithrombin III level to see if it is reproducibly low now that patient is at a steady state. I found four values recorded between 06/26/2007 and 01/23/2010. All of these values were normal in the range of 85-137 percent of control.

## 2015-01-31 NOTE — Progress Notes (Signed)
CARDIAC REHAB PHASE I   PRE:  Rate/Rhythm: 75 SR    BP: sitting 106/69    SaO2:   MODE:  Ambulation: 510 ft   POST:  Rate/Rhythm: 95 SR    BP: sitting 122/89     SaO2:   Tolerated well. Denied CP walking (second walk today). No questions.  [phone]   Elissa Lovett [name] CES, ACSM 01/31/2015 11:24 AM

## 2015-02-01 DIAGNOSIS — I513 Intracardiac thrombosis, not elsewhere classified: Secondary | ICD-10-CM | POA: Insufficient documentation

## 2015-02-01 LAB — PROTIME-INR
INR: 1.07 (ref 0.00–1.49)
PROTHROMBIN TIME: 14.1 s (ref 11.6–15.2)

## 2015-02-01 LAB — CBC
HCT: 29.2 % — ABNORMAL LOW (ref 36.0–46.0)
HEMOGLOBIN: 9.2 g/dL — AB (ref 12.0–15.0)
MCH: 28.3 pg (ref 26.0–34.0)
MCHC: 31.5 g/dL (ref 30.0–36.0)
MCV: 89.8 fL (ref 78.0–100.0)
Platelets: 174 10*3/uL (ref 150–400)
RBC: 3.25 MIL/uL — ABNORMAL LOW (ref 3.87–5.11)
RDW: 19.1 % — ABNORMAL HIGH (ref 11.5–15.5)
WBC: 7.6 10*3/uL (ref 4.0–10.5)

## 2015-02-01 LAB — ANTITHROMBIN III: ANTITHROMB III FUNC: 82 % (ref 75–120)

## 2015-02-01 MED ORDER — ENOXAPARIN SODIUM 80 MG/0.8ML ~~LOC~~ SOLN
70.0000 mg | Freq: Two times a day (BID) | SUBCUTANEOUS | Status: DC
  Filled 2015-02-01: qty 0.8

## 2015-02-01 MED ORDER — PANTOPRAZOLE SODIUM 40 MG PO TBEC: 40.0000 mg | DELAYED_RELEASE_TABLET | Freq: Every day | ORAL | Status: DC

## 2015-02-01 MED ORDER — WARFARIN SODIUM 7.5 MG PO TABS: 15.0000 mg | ORAL_TABLET | Freq: Once | ORAL | Status: DC

## 2015-02-01 MED ORDER — WARFARIN SODIUM 10 MG PO TABS
10.0000 mg | ORAL_TABLET | Freq: Once | ORAL | Status: AC
  Administered 2015-02-01: 10 mg via ORAL
  Filled 2015-02-01: qty 1

## 2015-02-01 MED ORDER — ISOSORBIDE MONONITRATE ER 30 MG PO TB24: 15.0000 mg | ORAL_TABLET | Freq: Every day | ORAL | Status: DC

## 2015-02-01 MED ORDER — ATORVASTATIN CALCIUM 40 MG PO TABS: 40.0000 mg | ORAL_TABLET | Freq: Every day | ORAL | Status: DC

## 2015-02-01 MED ORDER — ENOXAPARIN SODIUM 100 MG/ML ~~LOC~~ SOLN
100.0000 mg | SUBCUTANEOUS | Status: DC
  Administered 2015-02-01: 100 mg via SUBCUTANEOUS
  Filled 2015-02-01: qty 1

## 2015-02-01 MED ORDER — RANOLAZINE ER 500 MG PO TB12: 500.0000 mg | ORAL_TABLET | Freq: Two times a day (BID) | ORAL | Status: DC

## 2015-02-01 MED ORDER — WARFARIN SODIUM 5 MG PO TABS: 10.0000 mg | ORAL_TABLET | Freq: Every day | ORAL | Status: DC

## 2015-02-01 MED ORDER — AMLODIPINE BESYLATE 5 MG PO TABS: 5.0000 mg | ORAL_TABLET | Freq: Every day | ORAL | Status: DC

## 2015-02-01 NOTE — Discharge Instructions (Signed)
Please take your medications as prescribed on discharge. A home health nurse will help you with your medication management.  Please take Coumadin 10 mg on Friday-Sunday (11/18-11/20) along with your Lovenox daily until you follow up in clinic for the INR check on Monday November 21st at 11:00 am.  Please talk to your regular doctor about sleep hygiene to get better rest at night.  Insomnia Insomnia is a sleep disorder that makes it difficult to fall asleep or to stay asleep. Insomnia can cause tiredness (fatigue), low energy, difficulty concentrating, mood swings, and poor performance at work or school.  There are three different ways to classify insomnia:  Difficulty falling asleep.  Difficulty staying asleep.  Waking up too early in the morning. Any type of insomnia can be long-term (chronic) or short-term (acute). Both are common. Short-term insomnia usually lasts for three months or less. Chronic insomnia occurs at least three times a week for longer than three months. CAUSES  Insomnia may be caused by another condition, situation, or substance, such as:  Anxiety.  Certain medicines.  Gastroesophageal reflux disease (GERD) or other gastrointestinal conditions.  Asthma or other breathing conditions.  Restless legs syndrome, sleep apnea, or other sleep disorders.  Chronic pain.  Menopause. This may include hot flashes.  Stroke.  Abuse of alcohol, tobacco, or illegal drugs.  Depression.  Caffeine.   Neurological disorders, such as Alzheimer disease.  An overactive thyroid (hyperthyroidism). The cause of insomnia may not be known. RISK FACTORS Risk factors for insomnia include:  Gender. Women are more commonly affected than men.  Age. Insomnia is more common as you get older.  Stress. This may involve your professional or personal life.  Income. Insomnia is more common in people with lower income.  Lack of exercise.   Irregular work schedule or night  shifts.  Traveling between different time zones. SIGNS AND SYMPTOMS If you have insomnia, trouble falling asleep or trouble staying asleep is the main symptom. This may lead to other symptoms, such as:  Feeling fatigued.  Feeling nervous about going to sleep.  Not feeling rested in the morning.  Having trouble concentrating.  Feeling irritable, anxious, or depressed. TREATMENT  Treatment for insomnia depends on the cause. If your insomnia is caused by an underlying condition, treatment will focus on addressing the condition. Treatment may also include:   Medicines to help you sleep.  Counseling or therapy.  Lifestyle adjustments. HOME CARE INSTRUCTIONS   Take medicines only as directed by your health care provider.  Keep regular sleeping and waking hours. Avoid naps.  Keep a sleep diary to help you and your health care provider figure out what could be causing your insomnia. Include:   When you sleep.  When you wake up during the night.  How well you sleep.   How rested you feel the next day.  Any side effects of medicines you are taking.  What you eat and drink.   Make your bedroom a comfortable place where it is easy to fall asleep:  Put up shades or special blackout curtains to block light from outside.  Use a [place] noise machine to block noise.  Keep the temperature cool.   Exercise regularly as directed by your health care provider. Avoid exercising right before bedtime.  Use relaxation techniques to manage stress. Ask your health care provider to suggest some techniques that may work well for you. These may include:  Breathing exercises.  Routines to release muscle tension.  Visualizing peaceful scenes.  Cut back on alcohol, caffeinated beverages, and cigarettes, especially close to bedtime. These can disrupt your sleep.  Do not overeat or eat spicy foods right before bedtime. This can lead to digestive discomfort that can make it hard for you  to sleep.  Limit screen use before bedtime. This includes:  Watching TV.  Using your smartphone, tablet, and computer.  Stick to a routine. This can help you fall asleep faster. Try to do a quiet activity, brush your teeth, and go to bed at the same time each night.  Get out of bed if you are still awake after 15 minutes of trying to sleep. Keep the lights down, but try reading or doing a quiet activity. When you feel sleepy, go back to bed.  Make sure that you drive carefully. Avoid driving if you feel very sleepy.  Keep all follow-up appointments as directed by your health care provider. This is important. SEEK MEDICAL CARE IF:   You are tired throughout the day or have trouble in your daily routine due to sleepiness.  You continue to have sleep problems or your sleep problems get worse. SEEK IMMEDIATE MEDICAL CARE IF:   You have serious thoughts about hurting yourself or someone else.   This information is not intended to replace advice given to you by your health care provider. Make sure you discuss any questions you have with your health care provider.   Document Released: 02/29/2000 Document Revised: 11/22/2014 Document Reviewed: 12/02/2013 Elsevier Interactive Patient Education [employer]

## 2015-02-01 NOTE — Progress Notes (Signed)
Patient ID: Lindsey [name], female   DOB: 17-Jun-1970, 44 y.o.   MRN: [id number] Medicine attending discharge note: I personally examined this patient on the day of discharge together with resident physician Dr. Darreld Mclean and I tested the accuracy of his evaluation and discharge plan as recorded in his progress note dated 02/01/2015.  Clinical summary: Pleasant 44 year old woman with known coronary artery disease status post MI 2. She had a bare metal stent placed in the LAD in 2009. As a result of the MIs, she developed an area of akinesis versus aneurysm of the ventricular apex. She formed a thrombus in this area. She was on chronic anticoagulation but admitted she had been poorly compliant recently. There are statements in the chart record that she has antithrombin III deficiency but this is not supported by data obtained in 2011 wherefore independent values done over 2 months were all normal. She presented on the day of admission November 13 with ischemic quality retrosternal chest tightness radiating to the right arm and mid back. Although EKG was unchanged compared with a recent study, she did have elevation of troponin 0.11 with rise to a peak value of 0.76 consistent with a non-ST wave MI.  Hospital course: She was seen in consultation by cardiology. She underwent a cardiac cath. Her stent was patent. There was a 20% stenosis of the RCA and the LAD. A persistent left ventricular thrombus. A cardiac MRI was done which confirmed the persistence of a small, mural, cardiac apex thrombus, which at this point, is likely an organized thrombus with little risk for detachment.. She was put back on her home dose of warfarin 10 mg daily with a Lovenox bridge, counseled by our hospital pharmacist, and will follow-up in our outpatient Coumadin clinic on Monday, November 21 for pro time/INR testing. Cardiac symptoms improved with addition of long-acting nitrate and ranolazine to her regimen. She will continue  on a low-dose beta blocker and a statin. We elected to stop her Plavix now 7 years post coronary stenting and in view of full dose warfarin anticoagulation.  Disposition: Condition stable at time of discharge. She will follow-up in our internal medicine clinic and with cardiology. Follow-up in Coumadin clinic on Monday as noted above. There were no complications.

## 2015-02-01 NOTE — Progress Notes (Signed)
CARDIAC REHAB PHASE I   PRE:  Rate/Rhythm: 79 SR  BP:  Supine: 115/84  Sitting:   Standing:    SaO2:   MODE:  Ambulation: 550 ft   POST:  Rate/Rhythm: 92 SR  BP:  Supine:   Sitting: 125/93  Standing:    SaO2: 100%RA 1001-1020 Pt walked 550 ft on RA with steady gait. Had chest discomfort 3-4/10 which she stated that she has about all the time. This sensation did not increase with the walk. She did state that she felt a little wobbly and lightheaded. BP 125/93 after walk. To sitting on side of bed after walk.    Lindsey Nutting, RN BSN  02/01/2015 10:18 AM

## 2015-02-01 NOTE — Progress Notes (Addendum)
ANTICOAGULATION CONSULT NOTE   Pharmacy Consult for enoxaparin and Warfarin Indication: LV thrombus  No Known Allergies  Patient Measurements: Height: 5\' 6"  (167.6 cm) Weight: 168 lb (76.204 kg) IBW/kg (Calculated) : 59.3  Vital Signs: Temp: 97.7 F (36.5 C) (11/17 0750) Temp Source: Oral (11/17 0750) BP: 113/67 mmHg (11/17 0750) Pulse Rate: 73 (11/17 0750)  Labs:  Recent Labs  01/29/15 1505 01/30/15 0416 01/31/15 0400 02/01/15 0530  HGB  --  9.3*  --  9.2*  HCT  --  30.4*  --  29.2*  PLT  --  187  --  174  LABPROT 14.9  --  13.7 14.1  INR 1.15  --  1.03 1.07  HEPARINUNFRC 0.32  --   --   --   CREATININE  --   --  0.70  --     Estimated Creatinine Clearance: 91.7 mL/min (by C-G formula based on Cr of 0.7).  Assessment: 44 y.o. female with chest pain went to cath lab found non-obstructive CAD and new LV thrombus. She does have hx of apical thrombus, but it was not seen on ECHO in 2015.    She was on enoxaparin PTA 1.5mg /kg q24 for hx ATIII deficiency, she was also supposed to be taking warfarin but was having swallowing issues which is why switch to Lovenox was made. Unsure of the last time she took warfarin.  Antithrombin III levels are being rechecked- note in Dr. Patsy Lager note from yesterday that he previous levels were in normal percent of control values.  Enoxaparin was restarted post cath at dose of 1mg /kg q12 as this dosing regimen was studied in other conditions aside from DVT. Warfarin has been restarted. INR has been slow to rise- still only 1.07 this morning. CBC low but stable, no bleeding noted.    Goal of Therapy:  INR 2-3 Anti-Xa level 0.6-1 units/ml 4hrs after LMWH dose given Monitor platelets by anticoagulation protocol: Yes   Plan:  -Enoxaparin 70mg  subQ q12 -Warfarin 15mg  x1 -CBC q72h, daily INR -Monitor s/s bleeding -f/u antithrombin III levels  Annalaya Wile D. Joon Pohle, PharmD, BCPS Clinical Pharmacist Pager: [phone] 02/01/2015 8:35  AM   ADDENDUM Per request from Dr. Cyndie Chime, will change Lovenox and warfarin dosing to ease with discharge plans. Plan below is what was requested.  Plan: -Lovenox 100mg  subQ q24h starting at noon- RN informed of dose change -warfarin 10mg  - a one time dose was entered for tonight which can be given prior to her discharging. Team plans to send her home on 10mg  daily.  Duron Meister D. Dona Walby, PharmD, BCPS Clinical Pharmacist Pager: [phone] 02/01/2015 11:36 AM

## 2015-02-01 NOTE — Care Management Note (Signed)
Case Management Note Previous CM note initiated by Tomi Bamberger- RN CM   Patient Details  Name: Lindsey [name] MRN: [id number] Date of Birth: [birth year]  Subjective/Objective:  Pt admitted for Nstemi- Plan for cardiac cath 01-29-15.                   Action/Plan: CM will continue to monitor for additional disposition needs.    Expected Discharge Date:  01/30/15               Expected Discharge Plan:  Home/Self Care  In-House Referral:  NA  Discharge planning Services  CM Consult  Post Acute Care Choice:  Home HealthHome with Home Health Services Choice offered to:  PatientPatient  DME Arranged:  N/ANA DME Agency:  NANA  HH Arranged:  [facility]  Status of Service:  Completed Medicare Important Message Given:    Date Medicare IM Given:    Medicare IM give by:    Date Additional Medicare IM Given:    Additional Medicare Important Message give by:     If discussed at Long Length of Stay Meetings, dates discussed:    Additional Comments:  02/01/15- [name] RN, BSN - pt for d/c home today with HH-RN- notified [name] with [facility] of pt's discharge for [facility] services  01-30-15 [address], [place] [phone] Plan is for pt to d/c home with coumadin. Pt has Medicaid and cost should be no more than $3.00. Pt signed up with [facility]- pt will benefit from [facility] RN for medication management. CM did speak with pt and she wants to use [facility] for services. SOC to begin within 24-48 hrs of d/c. No further needs from CM at this time.    [name], RN 02/01/2015, 2:46 PM

## 2015-02-01 NOTE — Discharge Summary (Signed)
Name: MAIRYN LENAHAN MRN: [id number] DOB: [birth year] 44 y.o. PCP: Onnie Boer, MD  Date of Admission: 01/28/2015  2:07 PM Date of Discharge: 02/01/2015 Attending Physician: Levert Feinstein, MD  Discharge Diagnosis: 1. NSTEMI Principal Problem:   Chest pain Active Problems:   Difficulty swallowing pills   History of ischemic left PCA stroke   Hyperlipemia   Essential hypertension   CAD S/P  BMS to proximal LAD 2009-patent 01/29/15   Antithrombin III deficiency (HCC)   Apical mural thrombus (HCC)   Long term current use of anticoagulant therapy   NSTEMI (non-ST elevated myocardial infarction) (HCC)   Esophageal reflux   Left ventricular apical thrombus (HCC)  Discharge Medications:   Medication List    STOP taking these medications        hydrocortisone 25 MG suppository  Commonly known as:  ANUSOL-HC     pantoprazole sodium 40 mg/20 mL Pack  Commonly known as:  PROTONIX  Replaced by:  pantoprazole 40 MG tablet      TAKE these medications        acetaminophen 500 MG tablet  Commonly known as:  TYLENOL  Take 1,000 mg by mouth every 6 (six) hours as needed for headache (pain).     amLODipine 5 MG tablet  Commonly known as:  NORVASC  Take 1 tablet (5 mg total) by mouth daily.     atorvastatin 40 MG tablet  Commonly known as:  LIPITOR  Take 1 tablet (40 mg total) by mouth daily.     clopidogrel 75 MG tablet  Commonly known as:  PLAVIX  Take 75 mg by mouth daily.     enoxaparin 100 MG/ML injection  Commonly known as:  LOVENOX  Inject 1 mL (100 mg total) into the skin daily.     hydrocortisone-pramoxine rectal foam  Commonly known as:  PROCTOFOAM-HC  Place 1 applicator rectally 2 (two) times daily.     isosorbide mononitrate 30 MG 24 hr tablet  Commonly known as:  IMDUR  Take 0.5 tablets (15 mg total) by mouth daily.     metoprolol succinate 25 MG 24 hr tablet  Commonly known as:  TOPROL XL  Take 1 tablet (25 mg total) by mouth daily.     nitroGLYCERIN 0.4 MG SL tablet  Commonly known as:  NITROSTAT  Place 1 tablet (0.4 mg total) under the tongue every 5 (five) minutes as needed for chest pain (up to 3 doses).     ondansetron 4 MG disintegrating tablet  Commonly known as:  ZOFRAN ODT  Take 1 tablet (4 mg total) by mouth every 8 (eight) hours as needed for nausea or vomiting.     pantoprazole 40 MG tablet  Commonly known as:  PROTONIX  Take 1 tablet (40 mg total) by mouth daily.     ranolazine 500 MG 12 hr tablet  Commonly known as:  RANEXA  Take 1 tablet (500 mg total) by mouth 2 (two) times daily.     sucralfate 1 GM/10ML suspension  Commonly known as:  CARAFATE  Take 10 mLs (1 g total) by mouth 4 (four) times daily -  with meals and at bedtime.     warfarin 5 MG tablet  Commonly known as:  COUMADIN  Take 2 tablets (10 mg total) by mouth daily at 6 PM.        Disposition and follow-up:   Ms.Jimia W Ding was discharged from [facility] in Good condition.  At the hospital  follow up visit please address:  1.  -Please address whether patient is taking her Coumadin and Lovenox. She may stop Lovenox when INR is therapeutic between 2.0 - 3.0.  -Patient on Coumadin and Plavix, please monitor for signs/symptoms of bleeding, and assess if appropriate to discontinue Plavix  -Please address for further chest pain and GERD symptoms  -Please address patient's sleep quality and sleep hygiene  -Patient has home health nursing for medication management, please address if patient has better understanding of what medications she needs to take  2.  Labs / imaging needed at time of follow-up: PT/INR  3.  Pending labs/ test needing follow-up: none  Follow-up Appointments: Follow-up Information    Follow up with Olga Millers, MD On 02/23/2015.   Specialty:  Cardiology   Why:  8:45   Contact information:   [address] [place] [address] [phone]       Follow up with  [facility].   Why:  Registered Nurse for medication management.   Contact information:   [address] [place] [address] [phone]       Follow up with [facility] On 02/05/2015.   Why:  At 11:00 am for INR check.   Contact information:   [address] [place] [address] [phone]      Discharge Instructions: Discharge Instructions    Amb Referral to Cardiac Rehabilitation    Complete by:  As directed   Diagnosis:  Myocardial Infarction     Call MD for:  difficulty breathing, headache or visual disturbances    Complete by:  As directed      Call MD for:  persistant dizziness or light-headedness    Complete by:  As directed      Call MD for:  persistant nausea and vomiting    Complete by:  As directed      Call MD for:  severe uncontrolled pain    Complete by:  As directed      Call MD for:  temperature >100.4    Complete by:  As directed      Diet - low sodium heart healthy    Complete by:  As directed      Increase activity slowly    Complete by:  As directed            Consultations:  Cardiology  Procedures Performed:  Dg Chest 2 View  01/28/2015  CLINICAL DATA:  Chest pain radiating into the right arm and back. Shortness of breath. Symptoms for approximately 2 hours. Initial encounter. EXAM: CHEST  2 VIEW COMPARISON:  PA and lateral chest and CT chest 12/20/2014. FINDINGS: The lungs are clear. Heart size is normal. No pneumothorax or pleural effusion. No focal bony abnormality. IMPRESSION: No acute disease. Electronically Signed   By: Drusilla Kanner M.D.   On: 01/28/2015 14:55   Mr Card Morphology Wo/w Cm  01/31/2015  CLINICAL DATA:  R/O LV apical thrombus EXAM: CARDIAC MRI TECHNIQUE: The patient was scanned on a 1.5 Tesla GE magnet. A dedicated cardiac coil was used. Functional imaging was done using Fiesta sequences. 2,3, and 4 chamber views were done to assess for RWMA's. Modified  Simpson's rule using a short axis stack was used to calculate an ejection fraction on a dedicated work [employer]. The patient received 28 cc of Multihance. After 10 minutes inversion recovery sequences were used to assess for infiltration and scar tissue. CONTRAST:  28 cc Multihance FINDINGS: The atria were normal in size. RV normal in size and function. Mild MR. Normal aortic valve. Trivial pericardial effusion No ASD/VSD. The distal anterior wall, septum and entire apex were akinetic The quantitative EF was 50% with normal mid and basal function. Mild LVH septal thickness 12 mm. Delayed enhancement images with gadolinium showed full thickness scar involving the distal anterior wall, septum and apex. Long TI imaging post contrast showed a small amount Of mural apical thrombus IMPRESSION: 1) Mild LVE/LVH EF 50% with distal anterior wall, septal and apical akinesis 2) Post Gadolinium images with full thickness scar involving above areas 3) Long TI inversion recovery sequences post contrast show a small amount of mural apical thrombus 4) Mild MR 5) Trivial pericardial effusion Charlton Haws Electronically Signed   By: Charlton Haws M.D.   On: 01/31/2015 16:49    2D Echo: TTE 01/30/2015 - Left ventricle: The cavity size was normal. Systolic function was normal. The estimated ejection fraction was in the range of 55% to 60%. Akinesis of the apical myocardium. Features are consistent with a pseudonormal left ventricular filling pattern, with concomitant abnormal relaxation and increased filling pressure (grade 2 diastolic dysfunction). There was a 1.5 cm (L) x 2.0 cm (W), apicalthrombus. - Right ventricle: The cavity size was normal. Wall thickness was normal. Systolic function was normal. - Inferior vena cava: The vessel was normal in size. The respirophasic diameter changes were in the normal range (>= 50%), consistent with normal central venous  pressure.  Impressions:  - There was a 1.5 cm (L) x 2.0 cm (W), apical thrombus. Apical akinesis.  Cardiac Cath: 01/29/15  Prox RCA lesion, 20% stenosed.  Mid LAD to Dist LAD lesion, 20% stenosed.  There is mild left ventricular systolic dysfunction.  1. Minimal nonobstructive CAD. Patent stent in the LAD 2. Mild LV dysfunction with chronic akinesis of the apex. 3. Complex filling defect in the LV apex consistent with thrombus.  Admission HPI: Ms. Dannilynn Gallina is a 44 y/o female with CAD with 2 MIs s/p bare metal stent placement to LAD in 2009, self-reported antithrombin III deficiency on lovenox, apical mural thrombus not present on echo in 2015, TIA in 2010, and HTN who presents with chest pain. She says she was awoken from her sleep around 10 AM to a squeezing type chest pain located at her sternum that radiated to her mid-back as well as her right arm. She says the pain slowly increased during the day, and was not severe enough that she was able to drive and pick up her daughter, but eventually her pain was 7/10 and she came to the ED. She says she has not eaten during this time period, but does describe that the pain felt like indigestion. Her pain is worsened with laughter. She mentions having previous episodes of similar chest pain that have occurred at rest, more noticeable after meals, that resolved after about 15 minutes. Her pain is less severe when she is sitting up. She has taken nitroglycerin today for her pain which gave her relief for maybe 2-3 minutes. She has had issues with swallowing pills recently, which is improving, and takes a liquid PPI at home. She denies any palpitations, SOB, N/V/D, GI bleed, melena, numbness, tingling, cough, or dysuria.  Patient says she quit smoking about 2 months ago, but has about 15 years smoking history. She does admit to binge drinking on the weekends, more than 4 drinks. Denies illicit drug use.  Hospital Course by  problem  list: Principal Problem:   Chest pain Active Problems:   Difficulty swallowing pills   History of ischemic left PCA stroke   Hyperlipemia   Essential hypertension   CAD S/P  BMS to proximal LAD 2009-patent 01/29/15   Antithrombin III deficiency (HCC)   Apical mural thrombus (HCC)   Long term current use of anticoagulant therapy   NSTEMI (non-ST elevated myocardial infarction) (HCC)   Esophageal reflux   Left ventricular apical thrombus (HCC)   NSTEMI: Patient with mixed typical and atypical symptoms with initial troponin of 0.11 which peak of 0.76. Cardiac cath showed 20% stenosis to the RCA and LAD, patent stent, chronic akinesis of the apex, and a LV apex thrombus. Imdur and Ranolazine were added to her medications in addition to Metoprolol and Atorvastatin, with resulting improvement in her cardiac symptoms.  Apical mural thrombus: Persistent thrombus was seen on cardiac cath, TTE, and subsequent Cardiac MRI without evidence for sarcoma or fibroelastoma. It is likely her thrombus was precipated by her apical akinesis. She was restarted on her home Coumadin dose of 10 mg with Lovenox bridge, for an indefinite duration. She is to follow up for INR check in our clinic on 02/05/15.  GERD: Protonix and Sucralfate were continued and provided relief for patient's heartburn  ?Antithrombin III deficiency: Patient with ATIII deficiency documented in chart, prior values were normal. It is questionable if she truly has this condition. Repeat value was also WNL on this admission.  Discharge Vitals:   BP 123/93 mmHg  Pulse 71  Temp(Src) 98.5 F (36.9 C) (Oral)  Resp 18  Ht 5\' 6"  (1.676 m)  Wt 168 lb (76.204 kg)  BMI 27.13 kg/m2  SpO2 100%  Discharge Labs:  No results found for this or any previous visit (from the past 24 hour(s)).  Signed: Darreld Mclean, MD 02/02/2015, 4:03 PM    Services Ordered on Discharge: [facility] nursing for medication management Equipment Ordered on Discharge:  none

## 2015-02-01 NOTE — Progress Notes (Signed)
Went over patients discharge instructions in detail with patient. Answered all of patients questions in detail and to patients satisfaction. Educated patient in detail on all medications and all new medications and the importance of compliance with all medications. Educated patient of signs and symptoms of stroke. FAST. Telemetry discontinued, IV removed intact. Patient discharged to home with home health RN by way of wheelchair.

## 2015-02-01 NOTE — Progress Notes (Signed)
   Subjective: Patient says she is feeling well, able to eat and drink without issue. She feels ready to go home. She understands that she will need to continue her Coumadin and Lovenox once daily each on Friday, Saturday, and Sunday until she follows up in our clinic for INR check and medication adjustment as necessary. Objective: Vital signs in last 24 hours: Filed Vitals:   02/01/15 0400 02/01/15 0750 02/01/15 1006 02/01/15 1130  BP: 115/87 113/67 115/84 123/93  Pulse: 80 73  71  Temp: 98.5 F (36.9 C) 97.7 F (36.5 C)  98.5 F (36.9 C)  TempSrc: Oral Oral  Oral  Resp: 18 18  18   Height:      Weight: 168 lb (76.204 kg)     SpO2: 100% 98%  100%   Weight change: -6.4 oz (-0.181 kg)  Intake/Output Summary (Last 24 hours) at 02/01/15 1142 Last data filed at 02/01/15 0816  Gross per 24 hour  Intake   1460 ml  Output      0 ml  Net   1460 ml   General: resting in bed, pleasant, NAD Cardiac: RRR, no rubs, murmurs or gallops Pulm: clear to auscultation bilaterally, moving normal volumes of air Abd: soft, nontender, nondistended, BS present Ext: warm and well perfused, no pedal edema  Assessment/Plan: Principal Problem:   Chest pain Active Problems:   Difficulty swallowing pills   History of ischemic left PCA stroke   Hyperlipemia   Essential hypertension   CAD S/P  BMS to proximal LAD 2009-patent 01/29/15   Antithrombin III deficiency (HCC)   Apical mural thrombus (HCC)   Long term current use of anticoagulant therapy   NSTEMI (non-ST elevated myocardial infarction) (HCC)   Esophageal reflux  NSTEMI: Patient with chest pain that have mixed components of typical and atypical symptoms. Troponin trended up from 0.2 -->0.4-->0.76. Chest pain with some improvement on Nitroglycerin, but did have HA as side effect. Cardiac cath yesterday showed 20% stenosis to the RCA and LAD. Cath also showing a LV apex thrombus. She has history of prior apical thrombus.  -Oral anticoagulation  with Coumadin and Lovenox bridge until INR check on Monday -Continue Metoprolol 25 mg daily, Atorvastatin 40 mg daily -Imdur 15 mg po daily, Ranolazine 500 mg po BID  Apical Mural Thrombus likely due to akinesis, questionable/doubtful Antithrombin III deficiency: TTE shows apical thrombus. Needing longterm AC. Cardiac MRI shows small mural apical thrombus and distal anterior wall, septal, & apical akinesis. Her thrombus may be due to her akinesis.  -Coumadin with Lovenox bridge -Continue Plavix on discharge, watch for signs & symptoms of bleeding. -Will check Antithrombin III level, previous values have been in normal range  GERD: Patient's also with symptoms correlating with typical GERD. -Protonox 40 mg daily -Sucralfate  HTN: Blood pressures stable -Continue Metoprolol succinate 25 mg po daily -Monitor vitals    Dispo: Disposition is deferred at this time, awaiting improvement of current medical problems.    The patient does have a current PCP (Ejiroghene Wendall Stade, MD) and does need an [facility] hospital follow-up appointment after discharge.    LOS: 4 days   Darreld Mclean, MD 02/01/2015, 11:42 AM

## 2015-02-02 MED FILL — Perflutren Lipid Microsphere IV Susp 1.1 MG/ML: INTRAVENOUS | Qty: 10 | Status: AC

## 2015-02-05 ENCOUNTER — Ambulatory Visit (INDEPENDENT_AMBULATORY_CARE_PROVIDER_SITE_OTHER): Payer: Medicaid Other | Admitting: Pharmacist

## 2015-02-05 DIAGNOSIS — I213 ST elevation (STEMI) myocardial infarction of unspecified site: Secondary | ICD-10-CM

## 2015-02-05 DIAGNOSIS — Z7901 Long term (current) use of anticoagulants: Secondary | ICD-10-CM | POA: Diagnosis not present

## 2015-02-05 DIAGNOSIS — I513 Intracardiac thrombosis, not elsewhere classified: Secondary | ICD-10-CM

## 2015-02-05 LAB — POCT INR: INR: 1.4

## 2015-02-05 NOTE — Patient Instructions (Signed)
Patient instructed to take medications as defined in the Anti-coagulation Track section of this encounter.  Patient instructed to take today's dose.  Patient instructed to INCREASE her once-daily injection of enoxaparin/Lovenox to 120mg /0.11mL from her 150mg /47mL syringes.  Patient verbalized understanding of these instructions.

## 2015-02-05 NOTE — Progress Notes (Signed)
Anti-Coagulation Progress Note  Lindsey [name] is a 44 y.o. female who is currently on an anti-coagulation regimen.    RECENT RESULTS: Recent results are below, the most recent result is correlated with a dose of 70 mg. per week:  Additionally, patient has been on Lovenox 100mg  SQ q24h. Patient states her weight is 163 pounds = 74kg. At 1.5mg /kg SQ q24h the dose would be 111mg . Since we have latitude within the anti-Xa heparin range (0.5 - 1.2 units/mL), will increase her dose to provide minimally 1.5mg  for every kilogram of weight. Will round UP to 120mg  SQ q24h (1.6mg /kg every 24h) rather than providing the 1.35mg /kg q24h she had been on. Patient still has 150mg  Lovenox syringes quantum sufficient to see [place] through to next scheduled INR. She will give herself 0.37mL (120mg ) SQ q24h and continue with warfarin until seen next.  Lab Results  Component Value Date   INR 1.40 02/05/2015   INR 1.07 02/01/2015   INR 1.03 01/31/2015    ANTI-COAG DOSE: Anticoagulation Dose Instructions as of 02/05/2015      Glynis Smiles Tue Wed Thu Fri Sat   New Dose 10 mg 15 mg 10 mg 15 mg 10 mg 15 mg 10 mg       ANTICOAG SUMMARY: Anticoagulation Episode Summary    Current INR goal 2.0-3.0  Next INR check 02/12/2015  INR from last check 1.40! (02/05/2015)  Weekly max dose   Target end date   [facility]  Preferred lab   Send INR reminders to    Indications  Apical mural thrombus (HCC) [I21.3] Long term current use of anticoagulant therapy [Z79.01]        Comments         ANTICOAG TODAY: Anticoagulation Summary as of 02/05/2015    INR goal 2.0-3.0  Selected INR 1.40! (02/05/2015)  Next INR check 02/12/2015  Target end date    Indications  Apical mural thrombus (HCC) [I21.3] Long term current use of anticoagulant therapy [Z79.01]      Anticoagulation Episode Summary    [facility]   Preferred lab    Send INR reminders to    Comments        PATIENT INSTRUCTIONS: Patient Instructions  Patient instructed to take medications as defined in the Anti-coagulation Track section of this encounter.  Patient instructed to take today's dose.  Patient instructed to INCREASE her once-daily injection of enoxaparin/Lovenox to 120mg /0.63mL from her 150mg /71mL syringes.  Patient verbalized understanding of these instructions.       FOLLOW-UP Return in 7 days (on 02/12/2015) for Follow up INR at 1115h.  Hulen Luster, III Pharm.D., CACP

## 2015-02-07 ENCOUNTER — Telehealth: Payer: Self-pay | Admitting: Internal Medicine

## 2015-02-07 NOTE — Telephone Encounter (Signed)
Donita from [facility] requesting order for urine culture. Please call back.

## 2015-02-07 NOTE — Telephone Encounter (Signed)
Called and spoke with [place] with [facility].  Octavio Graves states pt is reporting same urinary symtoms that were treated on 10/11.  Burning occurs at the end of urination.   Bonita left a specimen cup at pt home if you want them to do a culture. OR I can try to get pt in to be seen.  She was hospitalized and unable to attend her follow-up apt on 11/15.  Please advise

## 2015-02-11 ENCOUNTER — Other Ambulatory Visit: Payer: Self-pay | Admitting: Internal Medicine

## 2015-02-11 DIAGNOSIS — R300 Dysuria: Secondary | ICD-10-CM

## 2015-02-11 NOTE — Telephone Encounter (Signed)
We will need a new urine sample for culture, she can come in, and drop a sample, and then follow up in clinic with results. I will place the order in Epic. Thanks Leigh.  Ejiro.

## 2015-02-12 ENCOUNTER — Other Ambulatory Visit: Payer: Self-pay | Admitting: Cardiology

## 2015-02-12 ENCOUNTER — Other Ambulatory Visit: Payer: Medicaid Other

## 2015-02-12 ENCOUNTER — Ambulatory Visit (INDEPENDENT_AMBULATORY_CARE_PROVIDER_SITE_OTHER): Payer: Medicaid Other | Admitting: Pharmacist

## 2015-02-12 DIAGNOSIS — R300 Dysuria: Secondary | ICD-10-CM

## 2015-02-12 DIAGNOSIS — I213 ST elevation (STEMI) myocardial infarction of unspecified site: Secondary | ICD-10-CM | POA: Diagnosis not present

## 2015-02-12 DIAGNOSIS — Z7901 Long term (current) use of anticoagulants: Secondary | ICD-10-CM

## 2015-02-12 DIAGNOSIS — I513 Intracardiac thrombosis, not elsewhere classified: Secondary | ICD-10-CM

## 2015-02-12 LAB — POCT INR: INR: 1.2

## 2015-02-12 NOTE — Patient Instructions (Signed)
Patient instructed to take medications as defined in the Anti-coagulation Track section of this encounter.  Patient instructed to take today's dose.  Patient verbalized understanding of these instructions.    

## 2015-02-12 NOTE — Telephone Encounter (Signed)
Informed Donita of plan.  She has seen pt this morning and will advise to give sample.  I also advised to have pt call to reschedule the follow up apt that she missed on 11/15

## 2015-02-12 NOTE — Progress Notes (Signed)
Anti-Coagulation Progress Note  Lindsey [name] is a 44 y.o. female who is currently on an anti-coagulation regimen.    RECENT RESULTS: Recent results are below, the most recent result is correlated with a dose of 85 mg. per week: Lab Results  Component Value Date   INR 1.20 02/12/2015   INR 1.40 02/05/2015   INR 1.07 02/01/2015    ANTI-COAG DOSE: Anticoagulation Dose Instructions as of 02/12/2015      Glynis Smiles Tue Wed Thu Fri Sat   New Dose 15 mg 15 mg 15 mg 15 mg 15 mg 15 mg 15 mg       ANTICOAG SUMMARY: Anticoagulation Episode Summary    Current INR goal 2.0-3.0  Next INR check 02/26/2015  INR from last check 1.20! (02/12/2015)  Weekly max dose   Target end date   [facility]  Preferred lab   Send INR reminders to    Indications  Apical mural thrombus (HCC) [I21.3] Long term current use of anticoagulant therapy [Z79.01]        Comments         ANTICOAG TODAY: Anticoagulation Summary as of 02/12/2015    INR goal 2.0-3.0  Selected INR 1.20! (02/12/2015)  Next INR check 02/26/2015  Target end date    Indications  Apical mural thrombus (HCC) [I21.3] Long term current use of anticoagulant therapy [Z79.01]      Anticoagulation Episode Summary    [facility]   Preferred lab    Send INR reminders to    Comments       PATIENT INSTRUCTIONS: Patient Instructions  Patient instructed to take medications as defined in the Anti-coagulation Track section of this encounter.  Patient instructed to take today's dose.  Patient verbalized understanding of these instructions.       FOLLOW-UP Return in 2 weeks (on 02/26/2015) for Follow up INR at 1130h.  Hulen Luster, III Pharm.D., CACP

## 2015-02-13 NOTE — Progress Notes (Signed)
HPI: FU CAD; s/p apical infarct in 2000 and NSTEMI in 2009 tx with BMS to LAD, prior CVA, ? Anti-thrombin III deficiency, HTN, HL. Echo 5/15 demonstrated apical mural thrombus. Placed on coumadin. ASA and Plavix were d/c'd. Carotid Dopplers December 2015 showed 1-39% bilateral stenosis. Monitor 12/15 showed sinus to sinus tachycardia with PVCs. Admitted recently with chest pain and troponin minimally elevated. Cardiac catheterization November 2016 showed no obstructive coronary disease. There was mild LV dysfunction with akinesis of the apex and note of an apical thrombus. Echocardiogram November 2016 showed normal LV function with akinesis of the apex. There was grade 2 diastolic dysfunction. There was note of an apical thrombus. Cardiac MRI November 2016 showed ejection fraction 50% with apical akinesis. There was an apical thrombus noted. Mild mitral regurgitation. Since last seen, She denies dyspnea, chest pain, palpitations or syncope.   Studies: - LHC (12/2011): pLAD stent ok with 30-40%, mLAD with myocardial bridging, EF 55%, apical akinesis with apical aneurysm (no change) >>> med Rx.  - Echo (12/15): ejection fraction 50-55%. No apical thrombus. - carotid Dopplers 02/20/2014-1-39% bilateral stenosis.  Current Outpatient Prescriptions  Medication Sig Dispense Refill  . acetaminophen (TYLENOL) 500 MG tablet Take 1,000 mg by mouth every 6 (six) hours as needed for headache (pain).    Marland Kitchen amLODipine (NORVASC) 5 MG tablet Take 1 tablet (5 mg total) by mouth daily. 30 tablet 2  . atorvastatin (LIPITOR) 40 MG tablet Take 1 tablet (40 mg total) by mouth daily. 30 tablet 2  . carvedilol (COREG) 6.25 MG tablet Take 6.25 mg by mouth daily.    . clonazePAM (KLONOPIN) 1 MG tablet Take 1 mg by mouth daily.    . clopidogrel (PLAVIX) 75 MG tablet TAKE ONE TABLET BY MOUTH EVERY DAY 30 tablet 0  . enoxaparin (LOVENOX) 120 MG/0.8ML injection Inject 0.8 mLs (120 mg total) into the skin daily.  Inject 0.37mls into the skin daily. 30 Syringe 0  . hydrocortisone-pramoxine (PROCTOFOAM-HC) rectal foam Place 1 applicator rectally 2 (two) times daily. 10 g 0  . isosorbide mononitrate (IMDUR) 30 MG 24 hr tablet Take 0.5 tablets (15 mg total) by mouth daily. 30 tablet 2  . metoprolol succinate (TOPROL XL) 25 MG 24 hr tablet Take 1 tablet (25 mg total) by mouth daily. 30 tablet 12  . naltrexone (DEPADE) 50 MG tablet Take 50 mg by mouth daily.    . nitroGLYCERIN (NITROSTAT) 0.4 MG SL tablet Place 1 tablet (0.4 mg total) under the tongue every 5 (five) minutes as needed for chest pain (up to 3 doses). 25 tablet 3  . ondansetron (ZOFRAN ODT) 4 MG disintegrating tablet Take 1 tablet (4 mg total) by mouth every 8 (eight) hours as needed for nausea or vomiting. 10 tablet 0  . pantoprazole (PROTONIX) 40 MG tablet Take 1 tablet (40 mg total) by mouth daily. 30 tablet 2  . ranolazine (RANEXA) 500 MG 12 hr tablet Take 1 tablet (500 mg total) by mouth 2 (two) times daily. 60 tablet 2  . sertraline (ZOLOFT) 50 MG tablet Take 100 mg by mouth daily.    . sucralfate (CARAFATE) 1 GM/10ML suspension Take 10 mLs (1 g total) by mouth 4 (four) times daily -  with meals and at bedtime. 420 mL 0  . warfarin (COUMADIN) 5 MG tablet Take 2 tablets (10 mg total) by mouth daily at 6 PM. 30 tablet 0   No current facility-administered medications for this visit.     Past  Medical History  Diagnosis Date  . Stroke (HCC)     assoc with short term memory loss and right peripheral vision loss; age 1   . Blood transfusion   . Anxiety   . Coronary artery disease     Apical LAD infarction '00; NSTEMI s/p BMS to prox LAD '09; Cath 12/2011 single vessel CAD w/ patent LAD stent w/ stable mild ISR, nl LV systolic fxn  . Anemia   . Antithrombin III deficiency (HCC)     ?pt not sure if true diagnosis  . Tobacco abuse   . High blood pressure   . Myocardial infarction [facility])     Past Surgical History  Procedure Laterality Date   . Tubal ligation    . Cardiac catheterization    . Esophagogastroduodenoscopy N/A 06/09/2012    Procedure: ESOPHAGOGASTRODUODENOSCOPY (EGD);  Surgeon: Theda Belfast, MD;  Location: [facility] ENDOSCOPY;  Service: Endoscopy;  Laterality: N/A;  . Coronary angioplasty    . Esophagogastroduodenoscopy N/A 08/02/2013    Procedure: ESOPHAGOGASTRODUODENOSCOPY (EGD);  Surgeon: Meryl Dare, MD;  Location: [facility] ENDOSCOPY;  Service: Endoscopy;  Laterality: N/A;  . Left heart catheterization with coronary angiogram N/A 12/24/2011    Procedure: LEFT HEART CATHETERIZATION WITH CORONARY ANGIOGRAM;  Surgeon: Kathleene Hazel, MD;  Location: [facility] CATH LAB;  Service: Cardiovascular;  Laterality: N/A;  . Colonoscopy N/A 03/29/2014    Procedure: COLONOSCOPY;  Surgeon: Louis Meckel, MD;  Location: [facility] ENDOSCOPY;  Service: Endoscopy;  Laterality: N/A;  . Cardiac catheterization N/A 01/29/2015    Procedure: Left Heart Cath and Coronary Angiography;  Surgeon: Peter M Swaziland, MD;  Location: [facility] INVASIVE CV LAB;  Service: Cardiovascular;  Laterality: N/A;    Social History   Social History  . Marital Status: Divorced    Spouse Name: N/A  . Number of Children: 2  . Years of Education: N/A   Occupational History  . Not on file.   Social History Main Topics  . Smoking status: Former Smoker -- 0.20 packs/day for 1 years    Types: Cigarettes    Quit date: 11/26/2014  . Smokeless tobacco: Never Used     Comment: STOPPED SMOKING IN SEPTEMBER 2016  . Alcohol Use: 0.0 oz/week    0 Cans of beer per week  . Drug Use: Yes    Special: Cocaine     Comment: hx of use x 1  . Sexual Activity: No   Other Topics Concern  . Not on file   Social History Narrative    ROS: no fevers or chills, productive cough, hemoptysis, dysphasia, odynophagia, melena, hematochezia, dysuria, hematuria, rash, seizure activity, orthopnea, PND, pedal edema, claudication. Remaining systems are negative.  Physical Exam: Well-developed  well-nourished in no acute distress.  Skin is warm and dry.  HEENT is normal.  Neck is supple.  Chest is clear to auscultation with normal expansion.  Cardiovascular exam is regular rate and rhythm.  Abdominal exam nontender or distended. No masses palpated. Extremities show no edema. neuro grossly intact

## 2015-02-14 LAB — URINE CULTURE

## 2015-02-15 ENCOUNTER — Ambulatory Visit: Payer: Medicaid Other | Admitting: Gastroenterology

## 2015-02-20 ENCOUNTER — Encounter: Payer: Self-pay | Admitting: Internal Medicine

## 2015-02-20 ENCOUNTER — Ambulatory Visit (INDEPENDENT_AMBULATORY_CARE_PROVIDER_SITE_OTHER): Payer: Medicaid Other | Admitting: Internal Medicine

## 2015-02-20 VITALS — BP 143/98 | HR 107 | Temp 98.6°F | Ht 66.0 in | Wt 168.0 lb

## 2015-02-20 DIAGNOSIS — R300 Dysuria: Secondary | ICD-10-CM | POA: Diagnosis not present

## 2015-02-20 DIAGNOSIS — I213 ST elevation (STEMI) myocardial infarction of unspecified site: Secondary | ICD-10-CM | POA: Diagnosis not present

## 2015-02-20 DIAGNOSIS — I2510 Atherosclerotic heart disease of native coronary artery without angina pectoris: Secondary | ICD-10-CM | POA: Diagnosis not present

## 2015-02-20 DIAGNOSIS — I513 Intracardiac thrombosis, not elsewhere classified: Secondary | ICD-10-CM

## 2015-02-20 DIAGNOSIS — Z9861 Coronary angioplasty status: Secondary | ICD-10-CM

## 2015-02-20 DIAGNOSIS — I10 Essential (primary) hypertension: Secondary | ICD-10-CM | POA: Diagnosis not present

## 2015-02-20 MED ORDER — ENOXAPARIN SODIUM 120 MG/0.8ML ~~LOC~~ SOLN: 120.0000 mg | SUBCUTANEOUS | Status: DC

## 2015-02-20 NOTE — Patient Instructions (Signed)
We will like you to continue your Lovenox till your INR gets to the normal level.   Also we will be increasing your dose of one of your medication, to see if that will help with the pain. If this persists, we might need to send you to a stomach doctor again.   Please avoid advil, alieve, Mobic, meloxicam, ibuprofen, naproxen, alcohol, this is important, as the last endoscopy you had showed that your stomach, esophagus were irritated.

## 2015-02-20 NOTE — Progress Notes (Signed)
Patient ID: Lindsey [name], female   DOB: 28-Nov-1970, 44 y.o.   MRN: [id number]   Subjective:   Patient ID: Lindsey [name] female   DOB: 1970/04/30 44 y.o.   MRN: [id number]  HPI: Ms.Lindsey [name] is a 44 y.o. with PMH listed below. Presented today for follow up of follow up of her chronic medical condition.s Please see assessment and plan for details on problems addressed today.  Past Medical History  Diagnosis Date  . Stroke (HCC)     assoc with short term memory loss and right peripheral vision loss; age 55   . Blood transfusion   . Anxiety   . Coronary artery disease     Apical LAD infarction '00; NSTEMI s/p BMS to prox LAD '09; Cath 12/2011 single vessel CAD w/ patent LAD stent w/ stable mild ISR, nl LV systolic fxn  . Anemia   . Antithrombin III deficiency (HCC)     ?pt not sure if true diagnosis  . Tobacco abuse   . High blood pressure   . Myocardial infarction [facility])    Current Outpatient Prescriptions  Medication Sig Dispense Refill  . acetaminophen (TYLENOL) 500 MG tablet Take 1,000 mg by mouth every 6 (six) hours as needed for headache (pain).    Marland Kitchen amLODipine (NORVASC) 5 MG tablet Take 1 tablet (5 mg total) by mouth daily. 30 tablet 2  . atorvastatin (LIPITOR) 40 MG tablet Take 1 tablet (40 mg total) by mouth daily. 30 tablet 2  . clopidogrel (PLAVIX) 75 MG tablet TAKE ONE TABLET BY MOUTH EVERY DAY 30 tablet 0  . enoxaparin (LOVENOX) 100 MG/ML injection Inject 1 mL (100 mg total) into the skin daily. (Patient not taking: Reported on 02/12/2015) 14 Syringe 0  . hydrocortisone-pramoxine (PROCTOFOAM-HC) rectal foam Place 1 applicator rectally 2 (two) times daily. (Patient not taking: Reported on 02/12/2015) 10 g 0  . isosorbide mononitrate (IMDUR) 30 MG 24 hr tablet Take 0.5 tablets (15 mg total) by mouth daily. 30 tablet 2  . metoprolol succinate (TOPROL XL) 25 MG 24 hr tablet Take 1 tablet (25 mg total) by mouth daily. 30 tablet 12  . nitroGLYCERIN (NITROSTAT) 0.4 MG SL  tablet Place 1 tablet (0.4 mg total) under the tongue every 5 (five) minutes as needed for chest pain (up to 3 doses). 25 tablet 3  . ondansetron (ZOFRAN ODT) 4 MG disintegrating tablet Take 1 tablet (4 mg total) by mouth every 8 (eight) hours as needed for nausea or vomiting. (Patient not taking: Reported on 02/12/2015) 10 tablet 0  . pantoprazole (PROTONIX) 40 MG tablet Take 1 tablet (40 mg total) by mouth daily. 30 tablet 2  . ranolazine (RANEXA) 500 MG 12 hr tablet Take 1 tablet (500 mg total) by mouth 2 (two) times daily. 60 tablet 2  . sucralfate (CARAFATE) 1 GM/10ML suspension Take 10 mLs (1 g total) by mouth 4 (four) times daily -  with meals and at bedtime. 420 mL 0  . warfarin (COUMADIN) 5 MG tablet Take 2 tablets (10 mg total) by mouth daily at 6 PM. 30 tablet 0   No current facility-administered medications for this visit.   Family History  Problem Relation Age of Onset  . Heart disease Brother     arrhythmia; died  . Breast cancer Maternal Aunt    Social History   Social History  . Marital Status: Divorced    Spouse Name: N/A  . Number of Children: 2  . Years of Education: N/A  Social History Main Topics  . Smoking status: Former Smoker -- 0.20 packs/day for 1 years    Types: Cigarettes    Quit date: 11/26/2014  . Smokeless tobacco: Never Used     Comment: STOPPED SMOKING IN SEPTEMBER 2016  . Alcohol Use: 0.0 oz/week    0 Cans of beer per week  . Drug Use: Yes    Special: Cocaine     Comment: hx of use x 1  . Sexual Activity: No   Other Topics Concern  . None   Social History Narrative   Review of Systems: CONSTITUTIONAL- No Fever. SKIN- No Rash, colour changes or itching. HEAD- Occasional mild Headache , no dizziness. EYES- intermitent partial vision loss. Mouth/throat- No Sorethroat, or bleeding gums. RESPIRATORY- No Cough or SOB. CARDIAC- No Palpitations, has been having intermittent chest pain, improved compared to prior. GI- No diarrhoea, abd  pain. URINARY- Has burning with urination NEUROLOGIC- Unilat upper extremity Numbness PYSCH- Denies depression or anxiety.  Objective:  Physical Exam: Filed Vitals:   02/20/15 1439  BP: 143/98  Pulse: 107  Temp: 98.6 F (37 C)  TempSrc: Oral  Height:  (1.676 m)  Weight: 168 lb (76.204 kg)  SpO2: 100%   GENERAL- alert, co-operative, appears as stated age, not in any distress. HEENT- Atraumatic, normocephalic, PERRL, oral mucosa appears moist CARDIAC- RRR, no murmurs, rubs or gallops. RESP- clear to auscultation bilaterally, no wheezes or crackles. ABDOMEN- Soft, nontender NEURO- No obvious Cr N abnormality, strenght upper and lower extremities- 5/5, Sensation intact- globally, normal visual fields, Gait- Normal. EXTREMITIES- pulse 2+, symmetric, no pedal edema. SKIN- Warm, dry, No rash or lesion. PSYCH- Normal mood and affect, appropriate thought content and speech.  Assessment & Plan:  The patient's case and plan of care was discussed with attending physician, Dr. Para March.  Please see problem based charting for assessment and plan.

## 2015-02-21 ENCOUNTER — Telehealth: Payer: Self-pay | Admitting: *Deleted

## 2015-02-21 DIAGNOSIS — Z0000 Encounter for general adult medical examination without abnormal findings: Secondary | ICD-10-CM | POA: Insufficient documentation

## 2015-02-21 NOTE — Assessment & Plan Note (Addendum)
Bp- 143/98. On Imdur, metop 25mg  daily, Norvasc 5mg  daily.   Plan- Will increase imdur- to 30mg  daily.  Blood sugars appear slightly elevated on BMET , check Hgba1c- Pt had left to check on next visit)

## 2015-02-21 NOTE — Assessment & Plan Note (Signed)
Patient today says that she has been having intermittent episodes of numbness involving her left arm, last ~62mins, also with loss of vision involving the lateral half her vision bilat, lasting a few minutes also, also intermittent slurred speech lasting a  Few minutes, noticed by family members.  Concern patient might have small emboli, from apical thrombus.  Her INR is still not therapeutic- 1.2- 02/12/2015, though she says he has been complaint with her coumadin.    Plan-She will continue with bridging for now with Lovenox, till her INR becomes therapeutic.  - She is also on plavix- PCA And CVA, with increased risk for Bleeding monitor CBC.

## 2015-02-21 NOTE — Assessment & Plan Note (Deleted)
Hx of Coronary art Stent placement- 2007. Recent hospitalization for chest pain. Pt had Cardiac Cath- 01/2015- revealed  Prox RCA lesion, 20% stenosed.  Mid LAD to Dist LAD lesion, 20% stenosed.  There is mild left ventricular systolic dysfunction.   Also had Echo done- 01/2015- Akinesis of apical Myocardium, and a 1.5 cm (L) x 2.0 cm, apical thrombus.  Today she reports chest pain is still present but better that chest pain warranting adimission. She is complaint with her Imdur.  Plan - Will increase Indur to 30mg  daily.  - if pain persists consider refferal to GI, as she has a hx of LA class B esophagitis, erosive gastritis, and duodenitis. She still drinks alcohol, and is compliant with PPI.  - Once again emphasized Avoid Alcohol and NSAIDs.

## 2015-02-21 NOTE — Telephone Encounter (Signed)
Donita with [facility]  is aware of Dr Mariea Clonts message.

## 2015-02-21 NOTE — Telephone Encounter (Signed)
Donita with [facility] [phone] called about pt  - BP this AM 130/100. Pt has been checking BP - about 2 days ago BP 204/141. Pt is under the impression BP was being changed. Also [facility] would like to extend  home care  on pt. Stanton Kidney Rodert Hinch RN 02/21/15 9:30AM

## 2015-02-21 NOTE — Assessment & Plan Note (Addendum)
Pt missed her appointment here for PAP smear. Declined flu shot.   Plan- Pap smear next visit, she will schedule appt.

## 2015-02-21 NOTE — Assessment & Plan Note (Addendum)
Hx of Coronary art Stent placement- 2009. Recent hospitalization for chest pain. Pt had Cardiac Cath- 01/2015- revealed  Prox RCA lesion, 20% stenosed.  Mid LAD to Dist LAD lesion, 20% stenosed.  There is mild left ventricular systolic dysfunction.   Also had Echo done- 01/2015- Akinesis of apical Myocardium, and a 1.5 cm (L) x 2.0 cm, apical thrombus.  Today she reports chest pain is still present but better that chest pain warranting admission. She is complaint with her Imdur.  Plan - Will increase Indur to 30mg  daily.  - if pain persists consider refferal to GI, as she has a hx of LA class B esophagitis, erosive gastritis, and duodenitis. She still drinks alcohol, and is compliant with PPI.  - Once again emphasized Avoid Alcohol and NSAIDs. - She will cont lipitor 40mg , Ranolazine and metop.

## 2015-02-21 NOTE — Progress Notes (Signed)
Internal Medicine Clinic Attending  Case discussed with Dr. Emokpae at the time of the visit.  We reviewed the resident's history and exam and pertinent patient test results.  I agree with the assessment, diagnosis, and plan of care documented in the resident's note.  

## 2015-02-21 NOTE — Telephone Encounter (Signed)
I agree with [facility] extending visits with her. I talked to her about increasing her Imdur, this was more to help with her chest pain and not really to lower her Bp as it was within acceptable limits in clinic in yesterday. But the medication will lower her blood pressure nevertheless. Thanks Stanton Kidney.   Lindsey [name].

## 2015-02-23 ENCOUNTER — Encounter: Payer: Self-pay | Admitting: Cardiology

## 2015-02-23 ENCOUNTER — Ambulatory Visit (INDEPENDENT_AMBULATORY_CARE_PROVIDER_SITE_OTHER): Payer: Medicaid Other | Admitting: Cardiology

## 2015-02-23 ENCOUNTER — Telehealth: Payer: Self-pay | Admitting: *Deleted

## 2015-02-23 ENCOUNTER — Ambulatory Visit (INDEPENDENT_AMBULATORY_CARE_PROVIDER_SITE_OTHER): Payer: Medicaid Other | Admitting: Pharmacist Clinician (PhC)/ Clinical Pharmacy Specialist

## 2015-02-23 VITALS — BP 142/96 | HR 96 | Ht 66.0 in | Wt 165.3 lb

## 2015-02-23 DIAGNOSIS — E785 Hyperlipidemia, unspecified: Secondary | ICD-10-CM

## 2015-02-23 DIAGNOSIS — I2510 Atherosclerotic heart disease of native coronary artery without angina pectoris: Secondary | ICD-10-CM

## 2015-02-23 DIAGNOSIS — I10 Essential (primary) hypertension: Secondary | ICD-10-CM

## 2015-02-23 DIAGNOSIS — I513 Intracardiac thrombosis, not elsewhere classified: Secondary | ICD-10-CM

## 2015-02-23 DIAGNOSIS — D6859 Other primary thrombophilia: Secondary | ICD-10-CM

## 2015-02-23 DIAGNOSIS — Z7901 Long term (current) use of anticoagulants: Secondary | ICD-10-CM

## 2015-02-23 DIAGNOSIS — R002 Palpitations: Secondary | ICD-10-CM

## 2015-02-23 DIAGNOSIS — I213 ST elevation (STEMI) myocardial infarction of unspecified site: Secondary | ICD-10-CM

## 2015-02-23 DIAGNOSIS — Z9861 Coronary angioplasty status: Secondary | ICD-10-CM

## 2015-02-23 LAB — POCT INR: INR: 1.3

## 2015-02-23 MED ORDER — CARVEDILOL 12.5 MG PO TABS: 12.5000 mg | ORAL_TABLET | Freq: Two times a day (BID) | ORAL | Status: DC

## 2015-02-23 NOTE — Patient Instructions (Signed)
Your physician has recommended you make the following change in your medication:   1.) stop the ranexa  2.) the carvedilol has been changed to 12.5 mg twice a day. A new prescription has been sent to your pharmacy to reflect this change.  Your physician wants you to follow-up in: 6 months or sooner if needed.You will receive a reminder letter in the mail two months in advance. If you don't receive a letter, please call our office to schedule the follow-up appointment.

## 2015-02-23 NOTE — Assessment & Plan Note (Signed)
Continue Coumadin. 

## 2015-02-23 NOTE — Assessment & Plan Note (Signed)
Continue beta blocker. 

## 2015-02-23 NOTE — Telephone Encounter (Signed)
CALLED PATIENT LEFT VOICE MESSAGE ON CELL PHONE. DR. E HAS NOT MAKE ANY REFERRAL IN SYSTEM THAT I CAN TELL.

## 2015-02-23 NOTE — Assessment & Plan Note (Signed)
Blood pressure elevated. Increase Coreg to 12.5 mg twice a day.

## 2015-02-23 NOTE — Assessment & Plan Note (Addendum)
Continue statin and Plavix. Discontinue Ranexa.

## 2015-02-23 NOTE — Assessment & Plan Note (Signed)
Continue Lovenox until Coumadin is therapeutic. This is being followed by internal medicine. Note her INR this morning is 1.3. She is scheduled for recheck Monday.

## 2015-02-23 NOTE — Assessment & Plan Note (Signed)
Continue statin. 

## 2015-02-26 ENCOUNTER — Ambulatory Visit: Payer: Medicaid Other

## 2015-03-01 ENCOUNTER — Inpatient Hospital Stay (HOSPITAL_COMMUNITY): Admission: RE | Admit: 2015-03-01 | Payer: Medicaid Other | Source: Ambulatory Visit

## 2015-03-01 ENCOUNTER — Telehealth (HOSPITAL_COMMUNITY): Payer: Self-pay | Admitting: *Deleted

## 2015-03-01 NOTE — Progress Notes (Signed)
I have reviewed Dr. Saralyn Pilar note.  Patient is on [facility] for apical mural thrombus.  INR low and coumadin was increased.

## 2015-03-02 ENCOUNTER — Other Ambulatory Visit: Payer: Self-pay | Admitting: Internal Medicine

## 2015-03-05 ENCOUNTER — Ambulatory Visit: Payer: Medicaid Other

## 2015-03-05 ENCOUNTER — Ambulatory Visit (HOSPITAL_COMMUNITY): Payer: Medicaid Other

## 2015-03-06 ENCOUNTER — Ambulatory Visit (INDEPENDENT_AMBULATORY_CARE_PROVIDER_SITE_OTHER): Payer: Medicaid Other | Admitting: Internal Medicine

## 2015-03-06 ENCOUNTER — Encounter: Payer: Self-pay | Admitting: Internal Medicine

## 2015-03-06 ENCOUNTER — Telehealth: Payer: Self-pay

## 2015-03-06 VITALS — BP 140/95 | HR 86 | Temp 98.6°F | Ht 66.0 in | Wt 170.3 lb

## 2015-03-06 DIAGNOSIS — M7989 Other specified soft tissue disorders: Secondary | ICD-10-CM | POA: Diagnosis not present

## 2015-03-06 DIAGNOSIS — R300 Dysuria: Secondary | ICD-10-CM

## 2015-03-06 DIAGNOSIS — I2510 Atherosclerotic heart disease of native coronary artery without angina pectoris: Secondary | ICD-10-CM | POA: Diagnosis not present

## 2015-03-06 DIAGNOSIS — Z9861 Coronary angioplasty status: Secondary | ICD-10-CM | POA: Diagnosis not present

## 2015-03-06 DIAGNOSIS — R079 Chest pain, unspecified: Secondary | ICD-10-CM | POA: Diagnosis not present

## 2015-03-06 LAB — POCT URINALYSIS DIPSTICK
Glucose, UA: NEGATIVE
Leukocytes, UA: NEGATIVE
Nitrite, UA: NEGATIVE
PH UA: 5.5
PROTEIN UA: 100
RBC UA: NEGATIVE
SPEC GRAV UA: 1.025
UROBILINOGEN UA: 1

## 2015-03-06 MED ORDER — AMOXICILLIN 500 MG PO CAPS: 500.0000 mg | ORAL_CAPSULE | Freq: Three times a day (TID) | ORAL | Status: DC

## 2015-03-06 MED ORDER — DOXYCYCLINE HYCLATE 100 MG PO TABS: 100.0000 mg | ORAL_TABLET | Freq: Two times a day (BID) | ORAL | Status: DC

## 2015-03-06 NOTE — Progress Notes (Signed)
Patient ID: Lindsey [name], female   DOB: [birth year], 44 y.o.   MRN: [id number]   Subjective:   Patient ID: Lindsey [name] female   DOB: [birth year] 44 y.o.   MRN: [id number]  HPI: Ms.Lindsey [name] is a 44 y.o. with PMH listed below. Presented today with complaints of dysuria, chest pain and leg swelling.Please see problem based charting for details, assessment and plan.  Past Medical History  Diagnosis Date  . Stroke (HCC)     assoc with short term memory loss and right peripheral vision loss; age 5   . Blood transfusion   . Anxiety   . Coronary artery disease     Apical LAD infarction '00; NSTEMI s/p BMS to prox LAD '09; Cath 12/2011 single vessel CAD w/ patent LAD stent w/ stable mild ISR, nl LV systolic fxn  . Anemia   . Antithrombin III deficiency (HCC)     ?pt not sure if true diagnosis  . Tobacco abuse   . High blood pressure   . Myocardial infarction [facility])    Current Outpatient Prescriptions  Medication Sig Dispense Refill  . acetaminophen (TYLENOL) 500 MG tablet Take 1,000 mg by mouth every 6 (six) hours as needed for headache (pain).    Marland Kitchen amLODipine (NORVASC) 5 MG tablet Take 1 tablet (5 mg total) by mouth daily. 30 tablet 2  . atorvastatin (LIPITOR) 40 MG tablet Take 1 tablet (40 mg total) by mouth daily. 30 tablet 2  . carvedilol (COREG) 12.5 MG tablet Take 1 tablet (12.5 mg total) by mouth 2 (two) times daily. 60 tablet 6  . clonazePAM (KLONOPIN) 1 MG tablet Take 1 mg by mouth daily.    . clopidogrel (PLAVIX) 75 MG tablet TAKE ONE TABLET BY MOUTH EVERY DAY 30 tablet 0  . enoxaparin (LOVENOX) 120 MG/0.8ML injection Inject 0.8 mLs (120 mg total) into the skin daily. Inject 0.63mls into the skin daily. 30 Syringe 0  . hydrocortisone-pramoxine (PROCTOFOAM-HC) rectal foam Place 1 applicator rectally 2 (two) times daily. 10 g 0  . isosorbide mononitrate (IMDUR) 30 MG 24 hr tablet Take 0.5 tablets (15 mg total) by mouth daily. 30 tablet 2  . metoprolol succinate (TOPROL XL) 25  MG 24 hr tablet Take 1 tablet (25 mg total) by mouth daily. 30 tablet 12  . naltrexone (DEPADE) 50 MG tablet Take 50 mg by mouth daily.    . nitroGLYCERIN (NITROSTAT) 0.4 MG SL tablet Place 1 tablet (0.4 mg total) under the tongue every 5 (five) minutes as needed for chest pain (up to 3 doses). 25 tablet 3  . ondansetron (ZOFRAN ODT) 4 MG disintegrating tablet Take 1 tablet (4 mg total) by mouth every 8 (eight) hours as needed for nausea or vomiting. 10 tablet 0  . pantoprazole (PROTONIX) 40 MG tablet Take 1 tablet (40 mg total) by mouth daily. 30 tablet 2  . sertraline (ZOLOFT) 50 MG tablet Take 100 mg by mouth daily.    . sucralfate (CARAFATE) 1 GM/10ML suspension Take 10 mLs (1 g total) by mouth 4 (four) times daily -  with meals and at bedtime. 420 mL 0  . warfarin (COUMADIN) 5 MG tablet Take 2 tablets (10 mg total) by mouth daily at 6 PM. 30 tablet 0   No current facility-administered medications for this visit.   Family History  Problem Relation Age of Onset  . Heart disease Brother     arrhythmia; died  . Breast cancer Maternal Aunt    Social History  Social History  . Marital Status: Divorced    Spouse Name: N/A  . Number of Children: 2  . Years of Education: N/A   Social History Main Topics  . Smoking status: Former Smoker -- 0.20 packs/day for 1 years    Types: Cigarettes    Quit date: 11/26/2014  . Smokeless tobacco: Never Used     Comment: STOPPED SMOKING IN SEPTEMBER 2016  . Alcohol Use: 0.0 oz/week    0 Cans of beer per week  . Drug Use: Yes    Special: Cocaine     Comment: hx of use x 1  . Sexual Activity: No   Other Topics Concern  . Not on file   Social History Narrative   Review of Systems: CONSTITUTIONAL- No Fever, weightloss, night sweat. SKIN- No Rash, slight reddish discolouration of ankle, no itching. HEAD- No Headache or dizziness. RESPIRATORY- No Cough or SOB. CARDIAC- No Palpitations, or chest pain. GI- No vomiting, diarrhoea, constipation,  abd pain, dysphagia is improving URINARY-Has dysuria but no frequency NEUROLOGIC- No Numbness, syncope, seizures or burning. [facility]- Denies depression or anxiety.  Objective:  Physical Exam: Filed Vitals:   03/06/15 1502  BP: 140/95  Pulse: 86  Temp: 98.6 F (37 C)  TempSrc: Oral  Height:  (1.676 m)  Weight: 170 lb 4.8 oz (77.248 kg)  SpO2: 100%   GENERAL- alert, co-operative, appears as stated age, not in any distress. HEENT- Atraumatic, normocephalic, PERR, healed ~0.1 by 0.1 cm healed scar on left check, below eye. CARDIAC- RRR, no murmurs, rubs or gallops, no tenderness to palpation. RESP-Clear to auscultation bilaterally, no wheezes or crackles. ABDOMEN- Soft, nontender,  bowel sounds present. BACK- Normal curvature of the spine, No tenderness along the vertebrae, no CVA tenderness. NEURO- No obvious Cr N abnormality, Gait- Normal. EXTREMITIES- pulse 2+, symmetric, right foot- puffiness around ankle, compared to left, tender to palpation with trace pitting edema to lower third of leg, warm especially when compared to left ankle, also with a ~0.2 by 0.2 small tense bullae appearing lesion inf to medial malleoli. Whole ankle appears tense. SKIN- Warm, dry, No rash or lesion. PSYCH- Normal mood and affect, appropriate thought content and speech.  Assessment & Plan:   The patient's case and plan of care was discussed with attending physician, Dr. Criselda Peaches.  Please see problem based charting for assessment and plan.

## 2015-03-06 NOTE — Patient Instructions (Signed)
We will like you to restart the medication you were taking- Ranexa, this was probably helping for your chest pain.  Also we will prescribe two antibiotics for you- Doxycycline two times a day and Amoxicillin three times a day, you will take both of them for 7 days. If the swelling in your legs gets worse, we will consider getting an ultrasound of your leg.   Also for your Bladder, we will check your urine again, and if this does not show anything we will send you to a urologist.

## 2015-03-06 NOTE — Telephone Encounter (Signed)
Rec'd call from RN with [facility] reporting that pt having new onset swelling/warmth to her RLE (foot/ankle) also with a new blister on the posterior ankle.  Also, pt reports to [facility] RN an episode of chest tightness over night for which she took 2 Nitroglycerin and Vicodin, no shortness of breath noted.  Patient also with complaints of symptoms related to UTI, burning with urination and back pain.   Pt added to PCP schedule to be evaluated today.

## 2015-03-06 NOTE — Telephone Encounter (Signed)
Agree with plan to be seen today.

## 2015-03-07 ENCOUNTER — Ambulatory Visit (HOSPITAL_COMMUNITY): Payer: Medicaid Other

## 2015-03-07 DIAGNOSIS — M7989 Other specified soft tissue disorders: Secondary | ICD-10-CM | POA: Insufficient documentation

## 2015-03-07 LAB — MICROSCOPIC EXAMINATION
Bacteria, UA: NONE SEEN
CASTS: NONE SEEN /LPF
Epithelial Cells (non renal): >10 /hpf — AB (ref 0–10)

## 2015-03-07 LAB — URINALYSIS, ROUTINE W REFLEX MICROSCOPIC
Bilirubin, UA: NEGATIVE
Glucose, UA: NEGATIVE
Leukocytes, UA: NEGATIVE
NITRITE UA: NEGATIVE
PH UA: 5.5 (ref 5.0–7.5)
RBC, UA: NEGATIVE
Specific Gravity, UA: 1.024 (ref 1.005–1.030)
Urobilinogen, Ur: 1 mg/dL (ref 0.2–1.0)

## 2015-03-07 MED ORDER — RANOLAZINE ER 500 MG PO TB12: 500.0000 mg | ORAL_TABLET | Freq: Two times a day (BID) | ORAL | Status: DC

## 2015-03-07 NOTE — Assessment & Plan Note (Signed)
Patient comes in with complaints of chest pain that started the night before, center of her chest, started while she was sitting watching TV, describes an ache, but otherwise she cannot tell me the character, she denies palpitations, nausea, vomiting or diaphoresis. She was previously on Ranexa which was started by Cards, but d/c ~ 2 weeks ago. Prior to d/c of med she reported doing better.   Plan- Will restart Ranexa for now, if pain recurs ,will refer to GI and d/c Ranexa. She has a hx of Gastritis, esophagitis and duodenitis and still drinks alcohol despite counselling to quit.  - Told pt to avoid Alcohol, NSAID( She takes tylenol for pain), cont PPI.

## 2015-03-07 NOTE — Assessment & Plan Note (Signed)
Leg swelling of 1 day duration., with pain and erythema. Leg appears tense also. She says she had something similar on her cheek about a wek ago, that opened up, there was some watery discharge she says. She denies fever, and does not know if she was bitten buy an insect.  Plan- Will prescribe a course of antibiotics- Amoxicillin 500mg  TID and doxycycline 100mg  BID for 7 days for cellulitis- considering- tenderness, swelling, erythema and warmth.  - Differential- DVT but less likely, considering pt is on lovenox and coumadin, she has been compliant with her lovenox- 120mg  daily. - Appointment in 2 days as clinic will be closed till the 27th, if getting worse, antibiotics might not have taken effect, or ineffective or may need to broaden differential or ultrasound for clots. - Antibiotics may interfere with her coumadin, but she is subtheraputic and so less likely to become supratherapeutic on short course of antibiotics. But watch INR.

## 2015-03-07 NOTE — Assessment & Plan Note (Addendum)
Pt still with dysuria for ~3 months. She was treat with Bactrim for UTI and then metronidazole while on admission in October for Trich, though this was seen on microscopy, but the ideal test when using urine is the Nucleic acid amplification test. Pt says he told her husband to get checked for STDs, but he came back clean. I told patient the significance of the trich in her urine- Considering it was seen on microscopy does not guarantee that she had Trich from sexual exposure. Today she has no vaginal symptoms. Considerations for interstitial cystitis and bladder pain syndrome.  Plan- UA with microscopy- Amorphous sediment crystals and +1 protein- mild and intermittent. - Will therefore refer to Urology for possible cystoscopy.  Called pt twice to inform her of results of urine exam and plan to refer to urology but no response, no voice mail.

## 2015-03-08 NOTE — Progress Notes (Signed)
Internal Medicine Clinic Attending  Case discussed with Dr. Emokpae at the time of the visit.  We reviewed the resident's history and exam and pertinent patient test results.  I agree with the assessment, diagnosis, and plan of care documented in the resident's note.  

## 2015-03-08 NOTE — Addendum Note (Signed)
Addended by: Debe Coder B on: 03/08/2015 05:31 PM   Modules accepted: Level of Service

## 2015-03-09 ENCOUNTER — Ambulatory Visit (HOSPITAL_COMMUNITY): Payer: Medicaid Other

## 2015-03-14 ENCOUNTER — Ambulatory Visit (HOSPITAL_COMMUNITY): Payer: Medicaid Other

## 2015-03-16 ENCOUNTER — Ambulatory Visit (HOSPITAL_COMMUNITY): Payer: Medicaid Other

## 2015-03-21 ENCOUNTER — Ambulatory Visit (HOSPITAL_COMMUNITY): Payer: Medicaid Other

## 2015-03-23 ENCOUNTER — Ambulatory Visit (HOSPITAL_COMMUNITY): Payer: Medicaid Other

## 2015-03-24 ENCOUNTER — Encounter (HOSPITAL_COMMUNITY): Payer: Self-pay | Admitting: Emergency Medicine

## 2015-03-24 ENCOUNTER — Emergency Department (HOSPITAL_COMMUNITY): Payer: Medicaid Other

## 2015-03-24 ENCOUNTER — Inpatient Hospital Stay (HOSPITAL_COMMUNITY)
Admission: EM | Admit: 2015-03-24 | Discharge: 2015-03-28 | DRG: 281 | Disposition: A | Discharge status: Home / Self Care | Payer: Medicaid Other | Attending: Cardiology | Admitting: Cardiology

## 2015-03-24 DIAGNOSIS — I214 Non-ST elevation (NSTEMI) myocardial infarction: Principal | ICD-10-CM | POA: Diagnosis present

## 2015-03-24 DIAGNOSIS — I69311 Memory deficit following cerebral infarction: Secondary | ICD-10-CM

## 2015-03-24 DIAGNOSIS — Z7901 Long term (current) use of anticoagulants: Secondary | ICD-10-CM

## 2015-03-24 DIAGNOSIS — I252 Old myocardial infarction: Secondary | ICD-10-CM

## 2015-03-24 DIAGNOSIS — D6489 Other specified anemias: Secondary | ICD-10-CM | POA: Diagnosis present

## 2015-03-24 DIAGNOSIS — Z87891 Personal history of nicotine dependence: Secondary | ICD-10-CM

## 2015-03-24 DIAGNOSIS — I2510 Atherosclerotic heart disease of native coronary artery without angina pectoris: Secondary | ICD-10-CM | POA: Diagnosis present

## 2015-03-24 DIAGNOSIS — I513 Intracardiac thrombosis, not elsewhere classified: Secondary | ICD-10-CM | POA: Diagnosis present

## 2015-03-24 DIAGNOSIS — I69312 Visuospatial deficit and spatial neglect following cerebral infarction: Secondary | ICD-10-CM

## 2015-03-24 DIAGNOSIS — D6859 Other primary thrombophilia: Secondary | ICD-10-CM | POA: Diagnosis present

## 2015-03-24 DIAGNOSIS — T45516A Underdosing of anticoagulants, initial encounter: Secondary | ICD-10-CM | POA: Diagnosis present

## 2015-03-24 DIAGNOSIS — Z91128 Patient's intentional underdosing of medication regimen for other reason: Secondary | ICD-10-CM

## 2015-03-24 DIAGNOSIS — I10 Essential (primary) hypertension: Secondary | ICD-10-CM | POA: Diagnosis present

## 2015-03-24 DIAGNOSIS — Z7902 Long term (current) use of antithrombotics/antiplatelets: Secondary | ICD-10-CM

## 2015-03-24 DIAGNOSIS — Z955 Presence of coronary angioplasty implant and graft: Secondary | ICD-10-CM

## 2015-03-24 DIAGNOSIS — D509 Iron deficiency anemia, unspecified: Secondary | ICD-10-CM | POA: Diagnosis present

## 2015-03-24 DIAGNOSIS — Z9861 Coronary angioplasty status: Secondary | ICD-10-CM

## 2015-03-24 LAB — I-STAT TROPONIN, ED: TROPONIN I, POC: 0.49 ng/mL — AB (ref 0.00–0.08)

## 2015-03-24 LAB — PREGNANCY, URINE: PREG TEST UR: NEGATIVE

## 2015-03-24 LAB — PROTIME-INR
INR: 1.03 (ref 0.00–1.49)
PROTHROMBIN TIME: 13.7 s (ref 11.6–15.2)

## 2015-03-24 LAB — APTT: APTT: 36 s (ref 24–37)

## 2015-03-24 MED ORDER — NITROGLYCERIN 2 % TD OINT
0.5000 [in_us] | TOPICAL_OINTMENT | Freq: Once | TRANSDERMAL | Status: AC
  Administered 2015-03-24: 0.5 [in_us] via TOPICAL
  Filled 2015-03-24: qty 1

## 2015-03-24 NOTE — ED Notes (Signed)
Charge nurse attempting IV insertion

## 2015-03-24 NOTE — ED Provider Notes (Addendum)
Medical screening exam: Patient with 2 days of left-sided chest pain radiating to the left arm down to the elbow. States the pain fluctuates between sharp and then numb. Associated shortness of breath. No new lower extremity swelling or pain. Patient is on Coumadin and on Lovenox injections. She's not missing doses. Patient states she's taken at least 5 aspirins today. She also tried nitroglycerin with no improvement in her chest pain. With recent NSTEMI. Is followed by Dr. Jens Som. Heart regular rate and rhythm. Lungs are clear. No lower extremity swelling or pain. Chest pain is reproduced with palpation of the left chest. EKG/labs/x-ray has been ordered.  Loren Racer, MD 03/24/15 [id number]  Loren Racer, MD 03/24/15 [phone]

## 2015-03-24 NOTE — ED Provider Notes (Signed)
CSN: [id number]     Arrival date & time 03/24/15  2237 History  By signing my name below, I, Bethel Born, attest that this documentation has been prepared under the direction and in the presence of Gilda Crease, MD. Electronically Signed: Bethel Born, ED Scribe. 03/24/2015. 11:52 PM   Chief Complaint  Patient presents with  . Chest Pain  . Shortness of Breath    The history is provided by the patient. No language interpreter was used.   Lindsey [name] is a 45 y.o. female with PMHx of MI who presents to the Emergency Department complaining of worsening, sharp, 7/10 in severity, left-sided chest pain with onset 3 days ago.  Initially the pain was intermittent but has been more constant in the last 2 days. The pain radiates down the LUE to the elbow and is similar to pain that she had in the past with MI. Laying flat exacerbates the pain. Tylenol and aspirin provided insufficient relief at home. Associated symptoms include SOB and nausea. Pt denies vomiting.   Past Medical History  Diagnosis Date  . Stroke (HCC)     assoc with short term memory loss and right peripheral vision loss; age 13   . Blood transfusion   . Anxiety   . Coronary artery disease     Apical LAD infarction '00; NSTEMI s/p BMS to prox LAD '09; Cath 12/2011 single vessel CAD w/ patent LAD stent w/ stable mild ISR, nl LV systolic fxn  . Anemia   . Antithrombin III deficiency (HCC)     ?pt not sure if true diagnosis  . Tobacco abuse   . High blood pressure   . Myocardial infarction [facility])    Past Surgical History  Procedure Laterality Date  . Tubal ligation    . Cardiac catheterization    . Esophagogastroduodenoscopy N/A 06/09/2012    Procedure: ESOPHAGOGASTRODUODENOSCOPY (EGD);  Surgeon: Theda Belfast, MD;  Location: [facility] ENDOSCOPY;  Service: Endoscopy;  Laterality: N/A;  . Coronary angioplasty    . Esophagogastroduodenoscopy N/A 08/02/2013    Procedure: ESOPHAGOGASTRODUODENOSCOPY (EGD);  Surgeon:  Meryl Dare, MD;  Location: [facility] ENDOSCOPY;  Service: Endoscopy;  Laterality: N/A;  . Left heart catheterization with coronary angiogram N/A 12/24/2011    Procedure: LEFT HEART CATHETERIZATION WITH CORONARY ANGIOGRAM;  Surgeon: Kathleene Hazel, MD;  Location: [facility] CATH LAB;  Service: Cardiovascular;  Laterality: N/A;  . Colonoscopy N/A 03/29/2014    Procedure: COLONOSCOPY;  Surgeon: Louis Meckel, MD;  Location: [facility] ENDOSCOPY;  Service: Endoscopy;  Laterality: N/A;  . Cardiac catheterization N/A 01/29/2015    Procedure: Left Heart Cath and Coronary Angiography;  Surgeon: Peter M Swaziland, MD;  Location: [facility] INVASIVE CV LAB;  Service: Cardiovascular;  Laterality: N/A;   Family History  Problem Relation Age of Onset  . Heart disease Brother     arrhythmia; died  . Breast cancer Maternal Aunt    Social History  Substance Use Topics  . Smoking status: Former Smoker -- 0.20 packs/day for 1 years    Types: Cigarettes    Quit date: 11/26/2014  . Smokeless tobacco: Never Used     Comment: STOPPED SMOKING IN SEPTEMBER 2016  . Alcohol Use: 0.0 oz/week    0 Cans of beer per week   OB History    No data available     Review of Systems  Respiratory: Positive for shortness of breath.   Cardiovascular: Positive for chest pain.  Gastrointestinal: Positive for nausea. Negative for vomiting.  All other systems reviewed and are negative.  Allergies  Review of patient's allergies indicates no known allergies.  Home Medications   Prior to Admission medications   Medication Sig Start Date End Date Taking? Authorizing Provider  acetaminophen (TYLENOL) 500 MG tablet Take 1,000 mg by mouth every 6 (six) hours as needed for headache (pain).   Yes Historical Provider, MD  amLODipine (NORVASC) 5 MG tablet Take 1 tablet (5 mg total) by mouth daily. 02/01/15  Yes Darreld Mclean, MD  atorvastatin (LIPITOR) 40 MG tablet Take 1 tablet (40 mg total) by mouth daily. 02/01/15  Yes Darreld Mclean, MD   carvedilol (COREG) 12.5 MG tablet Take 1 tablet (12.5 mg total) by mouth 2 (two) times daily. 02/23/15  Yes Lewayne Bunting, MD  clonazePAM (KLONOPIN) 1 MG tablet Take 1 mg by mouth daily.   Yes Historical Provider, MD  clopidogrel (PLAVIX) 75 MG tablet TAKE ONE TABLET BY MOUTH EVERY DAY 02/13/15  Yes Lewayne Bunting, MD  enoxaparin (LOVENOX) 120 MG/0.8ML injection Inject 0.8 mLs (120 mg total) into the skin daily. Inject 0.50mls into the skin daily. 02/20/15  Yes Ejiroghene Wendall Stade, MD  hydrocortisone-pramoxine (PROCTOFOAM-HC) rectal foam Place 1 applicator rectally 2 (two) times daily. 06/15/14  Yes Devoria Albe, MD  isosorbide mononitrate (IMDUR) 30 MG 24 hr tablet Take 0.5 tablets (15 mg total) by mouth daily. 02/01/15  Yes Darreld Mclean, MD  metoprolol succinate (TOPROL XL) 25 MG 24 hr tablet Take 1 tablet (25 mg total) by mouth daily. 03/03/14  Yes Lewayne Bunting, MD  nitroGLYCERIN (NITROSTAT) 0.4 MG SL tablet Place 1 tablet (0.4 mg total) under the tongue every 5 (five) minutes as needed for chest pain (up to 3 doses). 10/14/13  Yes Scott T Alben Spittle, PA-C  ondansetron (ZOFRAN ODT) 4 MG disintegrating tablet Take 1 tablet (4 mg total) by mouth every 8 (eight) hours as needed for nausea or vomiting. 09/10/14  Yes Kaitlyn Szekalski, PA-C  pantoprazole (PROTONIX) 40 MG tablet Take 1 tablet (40 mg total) by mouth daily. 02/01/15  Yes Darreld Mclean, MD  ranolazine (RANEXA) 500 MG 12 hr tablet Take 1 tablet (500 mg total) by mouth 2 (two) times daily. 03/07/15  Yes Ejiroghene E Emokpae, MD  sertraline (ZOLOFT) 50 MG tablet Take 100 mg by mouth daily.   Yes Historical Provider, MD  sucralfate (CARAFATE) 1 GM/10ML suspension Take 10 mLs (1 g total) by mouth 4 (four) times daily -  with meals and at bedtime. 12/22/14  Yes Darreld Mclean, MD  warfarin (COUMADIN) 5 MG tablet Take 2 tablets (10 mg total) by mouth daily at 6 PM. Patient taking differently: Take 15 mg by mouth daily at 6 PM.  02/02/15  Yes Darreld Mclean, MD  amoxicillin (AMOXIL) 500 MG capsule Take 1 capsule (500 mg total) by mouth 3 (three) times daily. Patient not taking: Reported on 03/24/2015 03/06/15   Ejiroghene Wendall Stade, MD  doxycycline (VIBRA-TABS) 100 MG tablet Take 1 tablet (100 mg total) by mouth 2 (two) times daily. Patient not taking: Reported on 03/24/2015 03/06/15   Ejiroghene E Emokpae, MD   BP 145/109 mmHg  Pulse 86  Resp 14  SpO2 99% Physical Exam  Constitutional: She is oriented to person, place, and time. She appears well-developed and well-nourished. No distress.  HENT:  Head: Normocephalic and atraumatic.  Right Ear: Hearing normal.  Left Ear: Hearing normal.  Nose: Nose normal.  Mouth/Throat: Oropharynx is clear and moist and mucous membranes are normal.  Eyes: Conjunctivae and EOM are normal. Pupils are equal, round, and reactive to light.  Neck: Normal range of motion. Neck supple.  Cardiovascular: Regular rhythm, S1 normal and S2 normal.  Exam reveals no gallop and no friction rub.   No murmur heard. Pulmonary/Chest: Effort normal and breath sounds normal. No respiratory distress. She exhibits tenderness.  Slight upper chest wall tenderness  Abdominal: Soft. Normal appearance and bowel sounds are normal. There is no hepatosplenomegaly. There is no tenderness. There is no rebound, no guarding, no tenderness at McBurney's point and negative Murphy's sign. No hernia.  Musculoskeletal: Normal range of motion.  Neurological: She is alert and oriented to person, place, and time. She has normal strength. No cranial nerve deficit or sensory deficit. Coordination normal. GCS eye subscore is 4. GCS verbal subscore is 5. GCS motor subscore is 6.  Skin: Skin is warm, dry and intact. No rash noted. No cyanosis.  Psychiatric: She has a normal mood and affect. Her speech is normal and behavior is normal. Thought content normal.  Nursing note and vitals reviewed.   ED Course  Procedures (including critical care  time) DIAGNOSTIC STUDIES: Oxygen Saturation is 100% on RA,  normal by my interpretation.    COORDINATION OF CARE: 11:12 PM Discussed treatment plan which includes CXR, EKG, lab work, and NTG with pt at bedside and pt agreed to plan.  Labs Review Labs Reviewed  CBC WITH DIFFERENTIAL/PLATELET - Abnormal; Notable for the following:    RBC 3.72 (*)    Hemoglobin 10.8 (*)    HCT 33.4 (*)    RDW 23.1 (*)    All other components within normal limits  COMPREHENSIVE METABOLIC PANEL - Abnormal; Notable for the following:    Potassium 3.2 (*)    Glucose, Bld 118 (*)    All other components within normal limits  URINALYSIS, ROUTINE W REFLEX MICROSCOPIC (NOT AT [facility]) - Abnormal; Notable for the following:    APPearance CLOUDY (*)    Specific Gravity, Urine 1.003 (*)    Hgb urine dipstick LARGE (*)    Leukocytes, UA TRACE (*)    All other components within normal limits  TROPONIN I - Abnormal; Notable for the following:    Troponin I 1.06 (*)    All other components within normal limits  URINE MICROSCOPIC-ADD ON - Abnormal; Notable for the following:    Squamous Epithelial / LPF 6-30 (*)    Bacteria, UA FEW (*)    All other components within normal limits  I-STAT TROPOININ, ED - Abnormal; Notable for the following:    Troponin i, poc 0.49 (*)    All other components within normal limits  PREGNANCY, URINE  PROTIME-INR  APTT    Imaging Review Dg Chest Port 1 View  03/24/2015  CLINICAL DATA:  Shortness of breath, chest pain and nausea. EXAM: PORTABLE CHEST 1 VIEW COMPARISON:  01/28/2015 FINDINGS: The heart size and mediastinal contours are within normal limits. Both lungs are clear. The visualized skeletal structures are unremarkable. IMPRESSION: No active disease. Electronically Signed   By: Ted Mcalpine M.D.   On: 03/24/2015 23:33   I have personally reviewed and evaluated these images and lab results as part of my medical decision-making.   EKG Interpretation   Date/Time:   Saturday March 24 2015 22:53:18 EST Ventricular Rate:  98 PR Interval:  139 QRS Duration: 79 QT Interval:  356 QTC Calculation: 454 R Axis:   52 Text Interpretation:  Sinus rhythm Anterior infarct, old No significant  change since last tracing Confirmed by [facility]  MD, Jermiya Reichl [phone]) on  03/25/2015 1:01:04 AM      MDM   Final diagnoses:  NSTEMI (non-ST elevated myocardial infarction) [facility])    Patient presents to the ER for evaluation of chest pain. Patient does have a previous history of LAD stenting secondary to acute MI. She reports that she has been having pain for more than 48 hours. Pain was persistent through the course of yesterday and now has been intermittent today. She has taken 5 or 6 full-strength aspirins over the course of the day. Patient has had some improvement with nitroglycerin, but has not had complete resolution. Her first troponin is markedly elevated. EKG does not show evidence of obvious ischemia or infarct. Discussed with Dr. Tarri Glenn, cardiology. Patient except for transfer [place] onto the cardiology service. Patient is taking Lovenox 1.5 mg/kg daily, did take her morning dose. No additional presentation. Will convert to IV nitroglycerin, transfer for admission to stepdown unit.  CRITICAL CARE Performed by: Gilda Crease   Total critical care time: minutes  Critical care time was exclusive of separately billable procedures and treating other patients.  Critical care was necessary to treat or prevent imminent or life-threatening deterioration.  Critical care was time spent personally by me on the following activities: development of treatment plan with patient and/or surrogate as well as nursing, discussions with consultants, evaluation of patient's response to treatment, examination of patient, obtaining history from patient or surrogate, ordering and performing treatments and interventions, ordering and review of laboratory studies,  ordering and review of radiographic studies, pulse oximetry and re-evaluation of patient's condition.   I personally performed the services described in this documentation, which was scribed in my presence. The recorded information has been reviewed and is accurate.    Gilda Crease, MD 03/25/15 [phone]

## 2015-03-24 NOTE — ED Notes (Addendum)
PT reports to ER from home stating that this chest pain began 2 days; pt became SOB yesterday and nauseous today; pt denies weakness and dizziness; left arm numbness and tingling began today; pt has hx of stroke and MI; pt states "I still have a blood clot in my heart, that's why I'm on two blood thinners"; pt took "at least 5 aspirins" today;  MD at bedside during triage.

## 2015-03-24 NOTE — ED Notes (Signed)
Patient refused wheelchair to room 17 up on arrival.

## 2015-03-25 DIAGNOSIS — I214 Non-ST elevation (NSTEMI) myocardial infarction: Secondary | ICD-10-CM | POA: Diagnosis not present

## 2015-03-25 DIAGNOSIS — D509 Iron deficiency anemia, unspecified: Secondary | ICD-10-CM | POA: Diagnosis present

## 2015-03-25 DIAGNOSIS — Z955 Presence of coronary angioplasty implant and graft: Secondary | ICD-10-CM | POA: Diagnosis not present

## 2015-03-25 DIAGNOSIS — I69312 Visuospatial deficit and spatial neglect following cerebral infarction: Secondary | ICD-10-CM | POA: Diagnosis not present

## 2015-03-25 DIAGNOSIS — Z87891 Personal history of nicotine dependence: Secondary | ICD-10-CM | POA: Diagnosis not present

## 2015-03-25 DIAGNOSIS — I2510 Atherosclerotic heart disease of native coronary artery without angina pectoris: Secondary | ICD-10-CM | POA: Diagnosis present

## 2015-03-25 DIAGNOSIS — R079 Chest pain, unspecified: Secondary | ICD-10-CM

## 2015-03-25 DIAGNOSIS — Z7902 Long term (current) use of antithrombotics/antiplatelets: Secondary | ICD-10-CM | POA: Diagnosis not present

## 2015-03-25 DIAGNOSIS — I69311 Memory deficit following cerebral infarction: Secondary | ICD-10-CM | POA: Diagnosis not present

## 2015-03-25 DIAGNOSIS — D6859 Other primary thrombophilia: Secondary | ICD-10-CM | POA: Diagnosis present

## 2015-03-25 DIAGNOSIS — R0789 Other chest pain: Secondary | ICD-10-CM | POA: Diagnosis present

## 2015-03-25 DIAGNOSIS — T45516A Underdosing of anticoagulants, initial encounter: Secondary | ICD-10-CM | POA: Diagnosis present

## 2015-03-25 DIAGNOSIS — I252 Old myocardial infarction: Secondary | ICD-10-CM | POA: Diagnosis not present

## 2015-03-25 DIAGNOSIS — Z91128 Patient's intentional underdosing of medication regimen for other reason: Secondary | ICD-10-CM | POA: Diagnosis not present

## 2015-03-25 DIAGNOSIS — D6489 Other specified anemias: Secondary | ICD-10-CM | POA: Diagnosis present

## 2015-03-25 DIAGNOSIS — I513 Intracardiac thrombosis, not elsewhere classified: Secondary | ICD-10-CM | POA: Diagnosis present

## 2015-03-25 DIAGNOSIS — Z7901 Long term (current) use of anticoagulants: Secondary | ICD-10-CM | POA: Diagnosis not present

## 2015-03-25 DIAGNOSIS — I10 Essential (primary) hypertension: Secondary | ICD-10-CM | POA: Diagnosis present

## 2015-03-25 LAB — URINALYSIS, ROUTINE W REFLEX MICROSCOPIC
BILIRUBIN URINE: NEGATIVE
Glucose, UA: NEGATIVE mg/dL
KETONES UR: NEGATIVE mg/dL
Nitrite: NEGATIVE
PROTEIN: NEGATIVE mg/dL
SPECIFIC GRAVITY, URINE: 1.003 — AB (ref 1.005–1.030)
pH: 6 (ref 5.0–8.0)

## 2015-03-25 LAB — COMPREHENSIVE METABOLIC PANEL
ALBUMIN: 4 g/dL (ref 3.5–5.0)
ALT: 20 U/L (ref 14–54)
ANION GAP: 13 (ref 5–15)
AST: 30 U/L (ref 15–41)
Alkaline Phosphatase: 110 U/L (ref 38–126)
BILIRUBIN TOTAL: 0.6 mg/dL (ref 0.3–1.2)
BUN: 6 mg/dL (ref 6–20)
CHLORIDE: 105 mmol/L (ref 101–111)
CO2: 22 mmol/L (ref 22–32)
Calcium: 9.3 mg/dL (ref 8.9–10.3)
Creatinine, Ser: 0.55 mg/dL (ref 0.44–1.00)
GFR calc Af Amer: >60 mL/min (ref >60)
Glucose, Bld: 118 mg/dL — ABNORMAL HIGH (ref 65–99)
POTASSIUM: 3.2 mmol/L — AB (ref 3.5–5.1)
Sodium: 140 mmol/L (ref 135–145)
TOTAL PROTEIN: 7.5 g/dL (ref 6.5–8.1)

## 2015-03-25 LAB — CBC WITH DIFFERENTIAL/PLATELET
BASOS ABS: 0 10*3/uL (ref 0.0–0.1)
Basophils Relative: 0 %
EOS ABS: 0.1 10*3/uL (ref 0.0–0.7)
EOS PCT: 1 %
HCT: 33.4 % — ABNORMAL LOW (ref 36.0–46.0)
Hemoglobin: 10.8 g/dL — ABNORMAL LOW (ref 12.0–15.0)
Lymphocytes Relative: 24 %
Lymphs Abs: 2.1 10*3/uL (ref 0.7–4.0)
MCH: 29 pg (ref 26.0–34.0)
MCHC: 32.3 g/dL (ref 30.0–36.0)
MCV: 89.8 fL (ref 78.0–100.0)
MONO ABS: 0.7 10*3/uL (ref 0.1–1.0)
Monocytes Relative: 7 %
Neutro Abs: 6 10*3/uL (ref 1.7–7.7)
Neutrophils Relative %: 67 %
PLATELETS: 224 10*3/uL (ref 150–400)
RBC: 3.72 MIL/uL — AB (ref 3.87–5.11)
RDW: 23.1 % — AB (ref 11.5–15.5)
WBC: 8.9 10*3/uL (ref 4.0–10.5)

## 2015-03-25 LAB — LIPID PANEL
Cholesterol: 201 mg/dL — ABNORMAL HIGH (ref 0–200)
HDL: 60 mg/dL (ref >40)
LDL CALC: 68 mg/dL (ref 0–99)
Total CHOL/HDL Ratio: 3.4 RATIO
Triglycerides: 363 mg/dL — ABNORMAL HIGH (ref <150)
VLDL: 73 mg/dL — ABNORMAL HIGH (ref 0–40)

## 2015-03-25 LAB — BASIC METABOLIC PANEL
ANION GAP: 6 (ref 5–15)
CALCIUM: 8.9 mg/dL (ref 8.9–10.3)
CO2: 27 mmol/L (ref 22–32)
CREATININE: 0.56 mg/dL (ref 0.44–1.00)
Chloride: 104 mmol/L (ref 101–111)
GFR calc non Af Amer: >60 mL/min (ref >60)
GLUCOSE: 115 mg/dL — AB (ref 65–99)
Potassium: 3.8 mmol/L (ref 3.5–5.1)
SODIUM: 137 mmol/L (ref 135–145)

## 2015-03-25 LAB — BRAIN NATRIURETIC PEPTIDE: B Natriuretic Peptide: 68.4 pg/mL (ref 0.0–100.0)

## 2015-03-25 LAB — URINE MICROSCOPIC-ADD ON

## 2015-03-25 LAB — MRSA PCR SCREENING: MRSA by PCR: NEGATIVE

## 2015-03-25 LAB — TROPONIN I
TROPONIN I: 0.85 ng/mL — AB (ref <0.031)
TROPONIN I: 0.71 ng/mL — AB (ref <0.031)
Troponin I: 1.06 ng/mL (ref <0.031)

## 2015-03-25 MED ORDER — ALPRAZOLAM 0.25 MG PO TABS
0.2500 mg | ORAL_TABLET | Freq: Two times a day (BID) | ORAL | Status: DC | PRN
  Administered 2015-03-25 – 2015-03-27 (×4): 0.25 mg via ORAL
  Filled 2015-03-25 (×4): qty 1

## 2015-03-25 MED ORDER — ENOXAPARIN SODIUM 100 MG/ML ~~LOC~~ SOLN
70.0000 mg | Freq: Two times a day (BID) | SUBCUTANEOUS | Status: AC
  Administered 2015-03-25 – 2015-03-26 (×3): 70 mg via SUBCUTANEOUS
  Filled 2015-03-25 (×3): qty 1

## 2015-03-25 MED ORDER — ISOSORBIDE MONONITRATE ER 30 MG PO TB24
30.0000 mg | ORAL_TABLET | Freq: Every day | ORAL | Status: DC
  Administered 2015-03-25 – 2015-03-28 (×3): 30 mg via ORAL
  Filled 2015-03-25 (×4): qty 1

## 2015-03-25 MED ORDER — SERTRALINE HCL 100 MG PO TABS
100.0000 mg | ORAL_TABLET | Freq: Every day | ORAL | Status: DC
  Administered 2015-03-25 – 2015-03-28 (×4): 100 mg via ORAL
  Filled 2015-03-25 (×4): qty 1

## 2015-03-25 MED ORDER — ONDANSETRON HCL 4 MG/2ML IJ SOLN
4.0000 mg | Freq: Once | INTRAMUSCULAR | Status: AC
  Administered 2015-03-25: 4 mg via INTRAVENOUS
  Filled 2015-03-25: qty 2

## 2015-03-25 MED ORDER — CARVEDILOL 25 MG PO TABS
25.0000 mg | ORAL_TABLET | Freq: Two times a day (BID) | ORAL | Status: DC
  Administered 2015-03-25 – 2015-03-28 (×7): 25 mg via ORAL
  Filled 2015-03-25 (×7): qty 1

## 2015-03-25 MED ORDER — ACETAMINOPHEN 325 MG PO TABS
650.0000 mg | ORAL_TABLET | ORAL | Status: DC | PRN
  Administered 2015-03-25 – 2015-03-26 (×6): 650 mg via ORAL
  Filled 2015-03-25 (×6): qty 2

## 2015-03-25 MED ORDER — MORPHINE SULFATE (PF) 4 MG/ML IV SOLN
4.0000 mg | Freq: Once | INTRAVENOUS | Status: AC
  Administered 2015-03-25: 4 mg via INTRAVENOUS
  Filled 2015-03-25: qty 1

## 2015-03-25 MED ORDER — WARFARIN SODIUM 7.5 MG PO TABS
15.0000 mg | ORAL_TABLET | Freq: Once | ORAL | Status: AC
  Administered 2015-03-25: 15 mg via ORAL
  Filled 2015-03-25: qty 2

## 2015-03-25 MED ORDER — WARFARIN - PHARMACIST DOSING INPATIENT: Freq: Every day | Status: DC

## 2015-03-25 MED ORDER — WARFARIN SODIUM 7.5 MG PO TABS: 15.0000 mg | ORAL_TABLET | Freq: Once | ORAL | Status: DC

## 2015-03-25 MED ORDER — PANTOPRAZOLE SODIUM 40 MG PO TBEC
40.0000 mg | DELAYED_RELEASE_TABLET | Freq: Every day | ORAL | Status: DC
  Administered 2015-03-25 – 2015-03-28 (×4): 40 mg via ORAL
  Filled 2015-03-25 (×4): qty 1

## 2015-03-25 MED ORDER — CLOPIDOGREL BISULFATE 75 MG PO TABS
75.0000 mg | ORAL_TABLET | Freq: Every day | ORAL | Status: DC
  Administered 2015-03-25 – 2015-03-28 (×4): 75 mg via ORAL
  Filled 2015-03-25 (×4): qty 1

## 2015-03-25 MED ORDER — WARFARIN - PHARMACIST DOSING INPATIENT
Freq: Every day | Status: DC
  Administered 2015-03-25 – 2015-03-27 (×3)

## 2015-03-25 MED ORDER — NITROGLYCERIN 0.4 MG SL SUBL
0.4000 mg | SUBLINGUAL_TABLET | SUBLINGUAL | Status: DC | PRN
  Administered 2015-03-25: 0.4 mg via SUBLINGUAL
  Filled 2015-03-25: qty 1

## 2015-03-25 MED ORDER — ASPIRIN EC 81 MG PO TBEC: 81.0000 mg | DELAYED_RELEASE_TABLET | Freq: Every day | ORAL | Status: DC

## 2015-03-25 MED ORDER — ATORVASTATIN CALCIUM 40 MG PO TABS
40.0000 mg | ORAL_TABLET | Freq: Every day | ORAL | Status: DC
  Administered 2015-03-25 – 2015-03-27 (×3): 40 mg via ORAL
  Filled 2015-03-25 (×3): qty 1

## 2015-03-25 MED ORDER — SUCRALFATE 1 GM/10ML PO SUSP
1.0000 g | Freq: Three times a day (TID) | ORAL | Status: DC
  Administered 2015-03-25 – 2015-03-28 (×13): 1 g via ORAL
  Filled 2015-03-25 (×17): qty 10

## 2015-03-25 MED ORDER — NITROGLYCERIN IN D5W 200-5 MCG/ML-% IV SOLN
0.0000 ug/min | Freq: Once | INTRAVENOUS | Status: AC
  Administered 2015-03-25: 5 ug/min via INTRAVENOUS
  Filled 2015-03-25: qty 250

## 2015-03-25 NOTE — ED Notes (Signed)
Pt's medications were not crossing over in the pyxis and pharmacy was notified and is correcting the problem

## 2015-03-25 NOTE — Progress Notes (Signed)
Utilization review completed.  

## 2015-03-25 NOTE — Progress Notes (Signed)
    Subjective:  Chest pain persists but improved; mild dyspnea.   Objective:  Filed Vitals:   03/25/15 0630 03/25/15 0715 03/25/15 0749 03/25/15 0800  BP: 115/89 140/99 119/86 134/94  Pulse: 90 83 81 67  Temp:   98.1 F (36.7 C)   TempSrc:   Oral   Resp: 26 20 29 14   Height:      Weight:      SpO2: 100% 100% 100% 100%    Intake/Output from previous day:  Intake/Output Summary (Last 24 hours) at 03/25/15 9518 Last data filed at 03/25/15 0800  Gross per 24 hour  Intake 153.75 ml  Output      0 ml  Net 153.75 ml    Physical Exam: Physical exam: Well-developed well-nourished in no acute distress.  Skin is warm and dry.  HEENT is normal.  Neck is supple.  Chest is clear to auscultation with normal expansion.  Cardiovascular exam is regular rate and rhythm.  Abdominal exam nontender or distended. No masses palpated. Extremities show no edema. neuro grossly intact    Lab Results: Basic Metabolic Panel:  Recent Labs  84/16/60 2314 03/25/15 0418  NA 140 137  K 3.2* 3.8  CL 105 104  CO2 22 27  GLUCOSE 118* 115*  BUN 6 <5*  CREATININE 0.55 0.56  CALCIUM 9.3 8.9   CBC:  Recent Labs  03/24/15 2314  WBC 8.9  NEUTROABS 6.0  HGB 10.8*  HCT 33.4*  MCV 89.8  PLT 224   Cardiac Enzymes:  Recent Labs  03/24/15 2314 03/25/15 0418  TROPONINI 1.06* 0.85*     Assessment/Plan:  1 chest pain-etiology of symptoms unclear. She has had continuous chest pain for approximately 60 hours. Her symptoms worsened with lying flat and also with palpation of her sternum. Her electrocardiogram shows no ST changes. Her troponin is minimally elevated. Presentation is similar to that in November 2016. At that time cardiac catheterization revealed no obstructive coronary disease. I do not think she needs repeat catheterization. I will check an echocardiogram to make sure that she does not have a pericardial effusion. Question musculoskeletal component. Transfer to telemetry and  ambulate. 2 Apical mural thrombus-Resume Coumadin. Continue Lovenox. 3 anti-thrombin 3 deficiency-resume Coumadin. 4 coronary artery disease-continue Plavix. Discontinue aspirin. Continue statin. 5 hypertension-continue present medications. Transfer to telemetry.  Olga Millers 03/25/2015, 8:22 AM

## 2015-03-25 NOTE — ED Notes (Signed)
Report called to Judy, RN.

## 2015-03-25 NOTE — H&P (Signed)
Primary Cardiologist: Dr. 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 Symptoms started couple days ago, constant and increasing in intensity, some relief with Tylenol and almost 10 regular strength Aspirin in the last 24 hours. No syncope, orthopnea, palpitations, diaphoresis. She presented to [facility] ER and transferred to our facility due to positive Trop (1.06) but ECG negative for acute ST-T changes.   On 03/07/2015 Ranexa was restarted for recurrent CP by her PCP.    Review of Systems:  10 systems reviewed unremarkable except as noted in my HPI   Past Medical History  Diagnosis Date  . Stroke (HCC)     assoc with short term memory loss and right peripheral vision loss; age 79   . Blood transfusion   . Anxiety   . Coronary artery disease     Apical LAD infarction '00; NSTEMI s/p BMS to prox LAD '09; Cath 12/2011 single vessel CAD w/ patent LAD stent w/ stable mild ISR, nl LV systolic fxn  . Anemia   . Antithrombin III deficiency (HCC)     ?pt not sure if true diagnosis  . Tobacco abuse   . High blood pressure   . Myocardial infarction (HCC)     No current facility-administered medications on file prior to encounter.   Current Outpatient Prescriptions on File Prior to Encounter  Medication Sig Dispense Refill  . acetaminophen (TYLENOL) 500 MG tablet Take 1,000 mg by mouth every 6 (six) hours as needed for headache (pain).    Marland Kitchen amLODipine (NORVASC) 5 MG tablet Take 1 tablet (5 mg total) by mouth daily. 30 tablet 2  . atorvastatin (LIPITOR) 40 MG tablet Take 1 tablet (40 mg total)  by mouth daily. 30 tablet 2  . carvedilol (COREG) 12.5 MG tablet Take 1 tablet (12.5 mg total) by mouth 2 (two) times daily. 60 tablet 6  . clonazePAM (KLONOPIN) 1 MG tablet Take 1 mg by mouth daily.    . clopidogrel (PLAVIX) 75 MG tablet TAKE ONE TABLET BY MOUTH EVERY DAY 30 tablet 0  . enoxaparin (LOVENOX) 120 MG/0.8ML injection Inject 0.8 mLs (120 mg total) into the skin daily. Inject 0.11mls into the skin daily. 30 Syringe 0  . hydrocortisone-pramoxine (PROCTOFOAM-HC) rectal foam Place 1 applicator rectally 2 (two) times daily. 10 g 0  . isosorbide mononitrate (IMDUR) 30 MG 24 hr tablet Take 0.5 tablets (15 mg total) by mouth daily. 30 tablet 2  . metoprolol succinate (TOPROL XL) 25 MG 24 hr tablet Take 1 tablet (25 mg total) by mouth daily. 30 tablet 12  . nitroGLYCERIN (NITROSTAT) 0.4 MG SL tablet Place 1 tablet (0.4 mg total) under the tongue every 5 (five) minutes as needed for chest pain (up to 3 doses). 25 tablet 3  . ondansetron (ZOFRAN ODT) 4 MG disintegrating tablet Take 1 tablet (4 mg total) by mouth every 8 (eight) hours as needed for nausea or vomiting. 10 tablet 0  . pantoprazole (PROTONIX) 40 MG tablet Take 1 tablet (40 mg total) by mouth daily. 30 tablet 2  .  ranolazine (RANEXA) 500 MG 12 hr tablet Take 1 tablet (500 mg total) by mouth 2 (two) times daily.    . sertraline (ZOLOFT) 50 MG tablet Take 100 mg by mouth daily.    . sucralfate (CARAFATE) 1 GM/10ML suspension Take 10 mLs (1 g total) by mouth 4 (four) times daily -  with meals and at bedtime. 420 mL 0  . warfarin (COUMADIN) 5 MG tablet Take 2 tablets (10 mg total) by mouth daily at 6 PM. (Patient taking differently: Take 15 mg by mouth daily at 6 PM. ) 30 tablet 0  . amoxicillin (AMOXIL) 500 MG capsule Take 1 capsule (500 mg total) by mouth 3 (three) times daily. (Patient not taking: Reported on 03/24/2015) 21 capsule 0  . doxycycline (VIBRA-TABS) 100 MG tablet Take 1 tablet (100 mg total) by mouth 2 (two) times daily.  (Patient not taking: Reported on 03/24/2015) 14 tablet 0      No Known Allergies  Social History   Social History  . Marital Status: Divorced    Spouse Name: N/A  . Number of Children: 2  . Years of Education: N/A   Occupational History  . Not on file.   Social History Main Topics  . Smoking status: Former Smoker -- 0.20 packs/day for 1 years    Types: Cigarettes    Quit date: 11/26/2014  . Smokeless tobacco: Never Used     Comment: STOPPED SMOKING IN SEPTEMBER 2016  . Alcohol Use: 0.0 oz/week    0 Cans of beer per week  . Drug Use: Yes    Special: Cocaine     Comment: hx of use x 1  . Sexual Activity: No   Other Topics Concern  . Not on file   Social History Narrative    Family History  Problem Relation Age of Onset  . Heart disease Brother     arrhythmia; died  . Breast cancer Maternal Aunt     PHYSICAL EXAM: Filed Vitals:   03/25/15 0246 03/25/15 0300  BP: 117/79 125/81  Pulse: 81 84  Temp: 97.8 F (36.6 C)   Resp:  12   General:  Well appearing. No respiratory difficulty HEENT: normal Neck: supple. no JVD. Carotids 2+ bilat; no bruits. No lymphadenopathy or thryomegaly appreciated. Cor: PMI nondisplaced. Regular rate & rhythm. No rubs, gallops or murmurs. Two tender spots (one upper left parasternal and one lower left parasternal)  Lungs: clear Abdomen: soft, nontender, nondistended. No hepatosplenomegaly. No bruits or masses. Good bowel sounds. Extremities: no cyanosis, clubbing, rash, edema Neuro: alert & oriented x 3, cranial nerves grossly intact. moves all 4 extremities w/o difficulty. Affect pleasant.  ECG: NSR, normal AV conduction, narrow QRS, old anteroseptal infarct  Results for orders placed or performed during the hospital encounter of 03/24/15 (from the past 24 hour(s))  Urinalysis, Routine w reflex microscopic (not at [facility])     Status: Abnormal   Collection Time: 03/24/15 11:06 PM  Result Value Ref Range   Color, Urine YELLOW YELLOW     APPearance CLOUDY (A) CLEAR   Specific Gravity, Urine 1.003 (L) 1.005 - 1.030   pH 6.0 5.0 - 8.0   Glucose, UA NEGATIVE NEGATIVE mg/dL   Hgb urine dipstick LARGE (A) NEGATIVE   Bilirubin Urine NEGATIVE NEGATIVE   Ketones, ur NEGATIVE NEGATIVE mg/dL   Protein, ur NEGATIVE NEGATIVE mg/dL   Nitrite NEGATIVE NEGATIVE   Leukocytes, UA TRACE (A) NEGATIVE  Pregnancy, urine     Status: None   Collection  Time: 03/24/15 11:06 PM  Result Value Ref Range   Preg Test, Ur NEGATIVE NEGATIVE  Urine microscopic-add on     Status: Abnormal   Collection Time: 03/24/15 11:06 PM  Result Value Ref Range   Squamous Epithelial / LPF 6-30 (A) NONE SEEN   WBC, UA 0-5 0 - 5 WBC/hpf   RBC / HPF 6-30 0 - 5 RBC/hpf   Bacteria, UA FEW (A) NONE SEEN  CBC with Differential/Platelet     Status: Abnormal   Collection Time: 03/24/15 11:14 PM  Result Value Ref Range   WBC 8.9 4.0 - 10.5 K/uL   RBC 3.72 (L) 3.87 - 5.11 MIL/uL   Hemoglobin 10.8 (L) 12.0 - 15.0 g/dL   HCT 16.1 (L) 09.6 - 04.5 %   MCV 89.8 78.0 - 100.0 fL   MCH 29.0 26.0 - 34.0 pg   MCHC 32.3 30.0 - 36.0 g/dL   RDW 40.9 (H) 81.1 - 91.4 %   Platelets 224 150 - 400 K/uL   Neutrophils Relative % 67 %   Neutro Abs 6.0 1.7 - 7.7 K/uL   Lymphocytes Relative 24 %   Lymphs Abs 2.1 0.7 - 4.0 K/uL   Monocytes Relative 7 %   Monocytes Absolute 0.7 0.1 - 1.0 K/uL   Eosinophils Relative 1 %   Eosinophils Absolute 0.1 0.0 - 0.7 K/uL   Basophils Relative 0 %   Basophils Absolute 0.0 0.0 - 0.1 K/uL   Smear Review MORPHOLOGY UNREMARKABLE   Comprehensive metabolic panel     Status: Abnormal   Collection Time: 03/24/15 11:14 PM  Result Value Ref Range   Sodium 140 135 - 145 mmol/L   Potassium 3.2 (L) 3.5 - 5.1 mmol/L   Chloride 105 101 - 111 mmol/L   CO2 22 22 - 32 mmol/L   Glucose, Bld 118 (H) 65 - 99 mg/dL   BUN 6 6 - 20 mg/dL   Creatinine, Ser 7.82 0.44 - 1.00 mg/dL   Calcium 9.3 8.9 - 95.6 mg/dL   Total Protein 7.5 6.5 - 8.1 g/dL   Albumin 4.0  3.5 - 5.0 g/dL   AST 30 15 - 41 U/L   ALT 20 14 - 54 U/L   Alkaline Phosphatase 110 38 - 126 U/L   Total Bilirubin 0.6 0.3 - 1.2 mg/dL   GFR calc non Af Amer >60 >60 mL/min   GFR calc Af Amer >60 >60 mL/min   Anion gap 13 5 - 15  Protime-INR     Status: None   Collection Time: 03/24/15 11:14 PM  Result Value Ref Range   Prothrombin Time 13.7 11.6 - 15.2 seconds   INR 1.03 0.00 - 1.49  APTT     Status: None   Collection Time: 03/24/15 11:14 PM  Result Value Ref Range   aPTT 36 24 - 37 seconds  Troponin I     Status: Abnormal   Collection Time: 03/24/15 11:14 PM  Result Value Ref Range   Troponin I 1.06 (HH) <0.031 ng/mL  I-stat troponin, ED     Status: Abnormal   Collection Time: 03/24/15 11:39 PM  Result Value Ref Range   Troponin i, poc 0.49 (HH) 0.00 - 0.08 ng/mL   Comment NOTIFIED PHYSICIAN    Comment 3           Dg Chest Port 1 View  03/24/2015  CLINICAL DATA:  Shortness of breath, chest pain and nausea. EXAM: PORTABLE CHEST 1 VIEW COMPARISON:  01/28/2015 FINDINGS: The heart  size and mediastinal contours are within normal limits. Both lungs are clear. The visualized skeletal structures are unremarkable. IMPRESSION: No active disease. Electronically Signed   By: Ted Mcalpine M.D.   On: 03/24/2015 23:33   LHC and coronary angio 01/29/2015:   Prox RCA lesion, 20% stenosed.  Mid LAD to Dist LAD lesion, 20% stenosed.  There is mild left ventricular systolic dysfunction. 1. Minimal nonobstructive CAD. Patent stent in the LAD 2. Mild LV dysfunction with chronic akinesis of the apex. 3. Complex filling defect in the LV apex consistent with thrombus  Echo 01/30/2015 Study Conclusions - Left ventricle: The cavity size was normal. Systolic function was normal. The estimated ejection fraction was in the range of 55% to 60%. Akinesis of the apical myocardium. Features are consistent with a pseudonormal left ventricular filling pattern, with concomitant abnormal  relaxation and increased filling pressure (grade 2 diastolic dysfunction). There was a 1.5 cm (L) x 2.0 cm (W), apicalthrombus. - Right ventricle: The cavity size was normal. Wall thickness was normal. Systolic function was normal. - Inferior vena cava: The vessel was normal in size. The respirophasic diameter changes were in the normal range (>= 50%), consistent with normal central venous pressure.  Cardiac MRI 01/31/2015:  IMPRESSION: 1) Mild LVE/LVH EF 50% with distal anterior wall, septal and apical akinesis 2) Post Gadolinium images with full thickness scar involving above areas 3) Long TI inversion recovery sequences post contrast show a small amount of mural apical thrombus 4) Mild MR 5) Trivial pericardial effusion    ASSESSMENT:  1. Left sided CP with both typical and atypical features and point tenderness but positive cardiac biomarker to suggests NSTEMI in this patient with known single vessel CAD and patent pLAD stent on recent cath in 01/2015 - stable hemodynamically; no shock or acute HF - CP much better at this time and only has some ache and point tenderness   PLAN/DISCUSSION:  - Admit to cardiology  - Will treat her as NSTEMI given her history and presentation  - Low dose ASA 81 mg po qd from tomorrow (she took multiple regular strength ASA at home )  - Continue Plavix 75 mg po qd - Change Lovenox 120 mg sub qd (she takes at 6 pm) to 1 mg/kg subq q12h - Stop Amlodipine  - Increase Coreg from 12.5 mg po bid to 25 mg po bid  - Increase Imdur from 15 mg po qd to 30 mg po qd - Hold Ranexa (need to up titrate beta blocker and nitrates first)  - Would pursue ischemic directed therapy. If remains stable then will perform myocardial perfusion scan for risk stratification and to guide need for invasive therapy (cath).   Please refer to orders for other details.     Nevin Bloodgood, MD Cardiology

## 2015-03-25 NOTE — ED Notes (Signed)
MD at bedside. 

## 2015-03-25 NOTE — Progress Notes (Signed)
Md called new pt here from [facility].  Lindsey [name]

## 2015-03-25 NOTE — Consult Note (Signed)
ANTICOAGULATION CONSULT NOTE - Initial Consult  Pharmacy Consult for warfarin Indication: apical thrombus  Patient Measurements: Height: 5\' 6"  (167.6 cm) Weight: 160 lb 11.5 oz (72.9 kg) IBW/kg (Calculated) : 59.3  Labs:  Recent Labs  03/24/15 2314 03/25/15 0418  HGB 10.8*  --   HCT 33.4*  --   PLT 224  --   APTT 36  --   LABPROT 13.7  --   INR 1.03  --   CREATININE 0.55 0.56  TROPONINI 1.06* 0.85*    Estimated Creatinine Clearance: 89.7 mL/min (by C-G formula based on Cr of 0.56).   Assessment: 45 YO female with antithrombin III deficiency and apical thrombus diagnosed 01/2015. PTA patient was on Lovenox 120 mg [place] daily and coumadin 15 mg daily. Lovenox dose changed to 1 mg/kg [place] q12h on admission. Coumadin to be restarted. Last dose reported was 1/5 (patient missed 2 doses PTA due to nausea).    Goal of Therapy:  INR 2-3 Monitor platelets by anticoagulation protocol: Yes   Plan:  Coumadin 15 mg PO x1 Daily INR Monitor for s/sx bleeding  Greggory Stallion, PharmD Clinical Pharmacy Resident Pager # [phone] 03/25/2015 8:52 AM

## 2015-03-25 NOTE — Progress Notes (Signed)
Trop now 0.85 trending down from 1.06.  md already aware.  Will continue to monitor. Lindsey [name]

## 2015-03-25 NOTE — ED Notes (Signed)
Lab Critical of Troponin 1.06 notified to assigned RN

## 2015-03-25 NOTE — Progress Notes (Addendum)
ANTICOAGULATION CONSULT NOTE - Initial Consult  Pharmacy Consult for Lovenox Indication: chest pain/ACS, Apical mural thrombus  No Known Allergies  Patient Measurements: Height: 5\' 6"  (167.6 cm) Weight: 160 lb 11.5 oz (72.9 kg) IBW/kg (Calculated) : 59.3  Vital Signs: Temp: 97.8 F (36.6 C) (01/08 0246) Temp Source: Oral (01/08 0246) BP: 133/96 mmHg (01/08 0330) Pulse Rate: 75 (01/08 0330)  Labs:  Recent Labs  03/24/15 2314  HGB 10.8*  HCT 33.4*  PLT 224  APTT 36  LABPROT 13.7  INR 1.03  CREATININE 0.55  TROPONINI 1.06*    Estimated Creatinine Clearance: 89.7 mL/min (by C-G formula based on Cr of 0.55).   Medical History: Past Medical History  Diagnosis Date  . Stroke (HCC)     assoc with short term memory loss and right peripheral vision loss; age 30   . Blood transfusion   . Anxiety   . Coronary artery disease     Apical LAD infarction '00; NSTEMI s/p BMS to prox LAD '09; Cath 12/2011 single vessel CAD w/ patent LAD stent w/ stable mild ISR, nl LV systolic fxn  . Anemia   . Antithrombin III deficiency (HCC)     ?pt not sure if true diagnosis  . Tobacco abuse   . High blood pressure   . Myocardial infarction [facility])     Assessment: 45 y/o F on Lovenox/Warfarin PTA for apical mural thrombus, patient taking Lovenox 120 mg daily at home with last dose 1/7 at 1800, cardiology request changed to q12h dosing, will start 24 hours after last dose of daily lovenox. Warfarin PTA dose is 15 mg/day per anti-coag notes and med rec (states missed last 2 days). INR is 1.03 on admit, Hgb 10.8, other labs reviewed.   Goal of Therapy:  INR 2-3 Monitor platelets by anticoagulation protocol: Yes   Plan:  -Lovenox 70 mg subcutaneous q12h -Monitor for bleeding -Minimum q72h CBC while inpatient -F/U warfarin plans after cardiology work-up  Abran Duke 03/25/2015,3:51 AM

## 2015-03-26 ENCOUNTER — Ambulatory Visit (HOSPITAL_COMMUNITY): Payer: Medicaid Other

## 2015-03-26 DIAGNOSIS — R079 Chest pain, unspecified: Secondary | ICD-10-CM

## 2015-03-26 LAB — PROTIME-INR
INR: 1.09 (ref 0.00–1.49)
Prothrombin Time: 14.3 seconds (ref 11.6–15.2)

## 2015-03-26 LAB — CBC
HEMATOCRIT: 32.1 % — AB (ref 36.0–46.0)
HEMOGLOBIN: 9.9 g/dL — AB (ref 12.0–15.0)
MCH: 28.4 pg (ref 26.0–34.0)
MCHC: 30.8 g/dL (ref 30.0–36.0)
MCV: 92 fL (ref 78.0–100.0)
Platelets: 198 10*3/uL (ref 150–400)
RBC: 3.49 MIL/uL — AB (ref 3.87–5.11)
RDW: 23.1 % — ABNORMAL HIGH (ref 11.5–15.5)
WBC: 7 10*3/uL (ref 4.0–10.5)

## 2015-03-26 MED ORDER — WARFARIN SODIUM 10 MG PO TABS
20.0000 mg | ORAL_TABLET | Freq: Once | ORAL | Status: AC
  Administered 2015-03-26: 20 mg via ORAL
  Filled 2015-03-26: qty 2

## 2015-03-26 MED ORDER — ENOXAPARIN SODIUM 120 MG/0.8ML ~~LOC~~ SOLN
110.0000 mg | SUBCUTANEOUS | Status: DC
  Administered 2015-03-27 – 2015-03-28 (×2): 110 mg via SUBCUTANEOUS
  Filled 2015-03-26 (×2): qty 0.8

## 2015-03-26 NOTE — Progress Notes (Signed)
  Echocardiogram 2D Echocardiogram has been performed.  Lindsey [name] 03/26/2015, 3:51 PM

## 2015-03-26 NOTE — Progress Notes (Addendum)
ANTICOAGULATION CONSULT NOTE - Follow Up Consult  Pharmacy Consult for coumadin/lovenox Indication: apical mural thrombus, AT III deficiency  No Known Allergies  Patient Measurements: Height: 5\' 6"  (167.6 cm) Weight: 165 lb 9.6 oz (75.116 kg) IBW/kg (Calculated) : 59.3  Vital Signs: Temp: 98.6 F (37 C) (01/09 0755) Temp Source: Oral (01/09 0755) BP: 113/73 mmHg (01/09 0755) Pulse Rate: 75 (01/09 0755)  Labs:  Recent Labs  03/24/15 2314 03/25/15 0418 03/25/15 0945 03/26/15 0532  HGB 10.8*  --   --  9.9*  HCT 33.4*  --   --  32.1*  PLT 224  --   --  198  APTT 36  --   --   --   LABPROT 13.7  --   --  14.3  INR 1.03  --   --  1.09  CREATININE 0.55 0.56  --   --   TROPONINI 1.06* 0.85* 0.71*  --     Estimated Creatinine Clearance: 91 mL/min (by C-G formula based on Cr of 0.56).   Medications:  Scheduled:  . atorvastatin  40 mg Oral q1800  . carvedilol  25 mg Oral BID WC  . clopidogrel  75 mg Oral Daily  . enoxaparin  70 mg Subcutaneous Q12H  . isosorbide mononitrate  30 mg Oral Daily  . pantoprazole  40 mg Oral Daily  . sertraline  100 mg Oral Daily  . sucralfate  1 g Oral TID WC & HS  . Warfarin - Pharmacist Dosing Inpatient   Does not apply q1800    Assessment: 45 yo female here with CP on coumadin and lovenox at home for history ot apical mural thrombus and AT III deficiency.  =INR= 1.09, Hg= 9.9., plt= 198  -Home coumadin dose 15mg /day  Goal of Therapy:  INR 2-3 Monitor platelets by anticoagulation protocol: Yes   Plan:   -Coumadin 20mg  po today -Daily PT/INR  Harland German, Pharm D 03/26/2015 11:01 AM   Addendum -spoke with Dr. Patty Sermons. Patient was on lovenox PTA (1.5 mg/kg/day) and has lovenox 120mg  syringes at home.  Plan -Change back to daily dosing for ease of administration -Lovenox 1.5mg /kg q24h  -Last dose of lovenox q12 tonight then will begin daily dosing in am.  Harland German, Pharm D 03/26/2015 3:28 PM

## 2015-03-26 NOTE — Progress Notes (Signed)
Patient Name: Lindsey [name] Date of Encounter: 03/26/2015     Active Problems:   NSTEMI (non-ST elevated myocardial infarction) [facility])    SUBJECTIVE  The patient is feeling better. Still has some dull intermittent chest pain but less severe than yesterday. 2D echo not yet done. On warfarin for known apical thrombus. INR today only 1.09. On lovenox bridging.  CURRENT MEDS . atorvastatin  40 mg Oral q1800  . carvedilol  25 mg Oral BID WC  . clopidogrel  75 mg Oral Daily  . enoxaparin  70 mg Subcutaneous Q12H  . isosorbide mononitrate  30 mg Oral Daily  . pantoprazole  40 mg Oral Daily  . sertraline  100 mg Oral Daily  . sucralfate  1 g Oral TID WC & HS  . Warfarin - Pharmacist Dosing Inpatient   Does not apply q1800    OBJECTIVE  Filed Vitals:   03/25/15 2040 03/25/15 2136 03/26/15 0438 03/26/15 0755  BP: 106/73 100/65 109/84 113/73  Pulse:  80 72 75  Temp:  98.7 F (37.1 C) 98.5 F (36.9 C) 98.6 F (37 C)  TempSrc:  Oral Oral Oral  Resp:  Height:      Weight:   165 lb 9.6 oz (75.116 kg)   SpO2:  100% 100% 100%    Intake/Output Summary (Last 24 hours) at 03/26/15 1109 Last data filed at 03/26/15 0800  Gross per 24 hour  Intake    480 ml  Output      0 ml  Net    480 ml   Filed Weights   03/25/15 0246 03/26/15 0438  Weight: 160 lb 11.5 oz (72.9 kg) 165 lb 9.6 oz (75.116 kg)    PHYSICAL EXAM  General: Pleasant, NAD. Neuro: Alert and oriented X 3. Moves all extremities spontaneously. Psych: Normal affect. HEENT:  Normal  Neck: Supple without bruits or JVD. Lungs:  Resp regular and unlabored, CTA. Heart: RRR no s3, s4, or murmurs. No pericardial rub Abdomen: Soft, non-tender, non-distended, BS + x 4.  Extremities: No clubbing, cyanosis or edema. DP/PT/Radials 2+ and equal bilaterally.  Accessory Clinical Findings  CBC  Recent Labs  03/24/15 2314 03/26/15 0532  WBC 8.9 7.0  NEUTROABS 6.0  --   HGB 10.8* 9.9*  HCT 33.4* 32.1*  MCV  89.8 92.0  PLT 224 198   Basic Metabolic Panel  Recent Labs  03/24/15 2314 03/25/15 0418  NA 140 137  K 3.2* 3.8  CL 105 104  CO2 22 27  GLUCOSE 118* 115*  BUN 6 <5*  CREATININE 0.55 0.56  CALCIUM 9.3 8.9   Liver Function Tests  Recent Labs  03/24/15 2314  AST 30  ALT 20  ALKPHOS 110  BILITOT 0.6  PROT 7.5  ALBUMIN 4.0   No results for input(s): LIPASE, AMYLASE in the last 72 hours. Cardiac Enzymes  Recent Labs  03/24/15 2314 03/25/15 0418 03/25/15 0945  TROPONINI 1.06* 0.85* 0.71*   BNP Invalid input(s): POCBNP D-Dimer No results for input(s): DDIMER in the last 72 hours. Hemoglobin A1C No results for input(s): HGBA1C in the last 72 hours. Fasting Lipid Panel  Recent Labs  03/25/15 0418  CHOL 201*  HDL 60  LDLCALC 68  TRIG 363*  CHOLHDL 3.4   Thyroid Function Tests No results for input(s): TSH, T4TOTAL, T3FREE, THYROIDAB in the last 72 hours.  Invalid input(s): FREET3  TELE  NSR  ECG  NSR. No acute changes.  Radiology/Studies  Dg  Chest Port 1 View  03/24/2015  CLINICAL DATA:  Shortness of breath, chest pain and nausea. EXAM: PORTABLE CHEST 1 VIEW COMPARISON:  01/28/2015 FINDINGS: The heart size and mediastinal contours are within normal limits. Both lungs are clear. The visualized skeletal structures are unremarkable. IMPRESSION: No active disease. Electronically Signed   By: Ted Mcalpine M.D.   On: 03/24/2015 23:33    ASSESSMENT AND PLAN  1 chest pain-etiology of symptoms unclear. She has had continuous chest pain for approximately 60 hours. Her symptoms worsened with lying flat and also with palpation of her sternum. Her electrocardiogram shows no ST changes. Her troponin is minimally elevated. Presentation is similar to that in November 2016. At that time cardiac catheterization revealed no obstructive coronary disease. I do not think she needs repeat catheterization. Will follow up on echo which has not yet been done. 2 Apical  mural thrombus-Resume Coumadin. Continue Lovenox. 3 anti-thrombin 3 deficiency-resume Coumadin. 4 coronary artery disease-continue Plavix.  Continue statin. 5 hypertension-continue present medications. 6.AT III deficiency discovered when she was in her mid 70s. INR subtherapeutic.  Plan: continue warfarin/lovenox. Reorder echo. Signed, Ronny Flurry MD

## 2015-03-27 DIAGNOSIS — I214 Non-ST elevation (NSTEMI) myocardial infarction: Principal | ICD-10-CM

## 2015-03-27 LAB — CBC
HCT: 30 % — ABNORMAL LOW (ref 36.0–46.0)
HEMOGLOBIN: 9.3 g/dL — AB (ref 12.0–15.0)
MCH: 28.3 pg (ref 26.0–34.0)
MCHC: 31 g/dL (ref 30.0–36.0)
MCV: 91.2 fL (ref 78.0–100.0)
Platelets: 171 10*3/uL (ref 150–400)
RBC: 3.29 MIL/uL — ABNORMAL LOW (ref 3.87–5.11)
RDW: 22.8 % — ABNORMAL HIGH (ref 11.5–15.5)
WBC: 6.5 10*3/uL (ref 4.0–10.5)

## 2015-03-27 LAB — PROTIME-INR
INR: 1.1 (ref 0.00–1.49)
Prothrombin Time: 14.4 seconds (ref 11.6–15.2)

## 2015-03-27 LAB — FERRITIN: Ferritin: 25 ng/mL (ref 11–307)

## 2015-03-27 MED ORDER — WARFARIN SODIUM 10 MG PO TABS
20.0000 mg | ORAL_TABLET | Freq: Once | ORAL | Status: AC
  Administered 2015-03-27: 20 mg via ORAL
  Filled 2015-03-27: qty 2

## 2015-03-27 NOTE — Progress Notes (Signed)
Patient Name: Lindsey [name] Date of Encounter: 03/27/2015  Principal Problem:   NSTEMI (non-ST elevated myocardial infarction) [facility]) Active Problems:   CAD S/P  BMS to proximal LAD 2009-patent 01/29/15   Apical mural thrombus [facility])    Primary Cardiologist: Dr. Jens Som Patient Profile: 45 yo female w/ PMH of ATIII deficiency, HTN, CAD (s/p BMS to LAD in 2009,  repeat cath in 12/2011 and 01/2015 revealed patent stent), small apical mural thrombus by echo admitted on 03/24/2015 for chest pain.   SUBJECTIVE: Reports improvement in her chest pain. Still tender to palpation. Frustrated she has had several admissions for her pain but no documented diagnosis.  OBJECTIVE Filed Vitals:   03/26/15 1400 03/26/15 1728 03/26/15 2140 03/27/15 0528  BP: 91/60 105/74 110/71 110/77  Pulse: 77 69 73 68  Temp: 98.6 F (37 C)  98.8 F (37.1 C) 98.2 F (36.8 C)  TempSrc: Oral  Oral Oral  Resp: 18  18 18   Height:      Weight:    168 lb 3.2 oz (76.295 kg)  SpO2: 100%  99% 99%    Intake/Output Summary (Last 24 hours) at 03/27/15 0913 Last data filed at 03/26/15 1401  Gross per 24 hour  Intake    240 ml  Output    300 ml  Net    -60 ml   Filed Weights   03/25/15 0246 03/26/15 0438 03/27/15 0528  Weight: 160 lb 11.5 oz (72.9 kg) 165 lb 9.6 oz (75.116 kg) 168 lb 3.2 oz (76.295 kg)    PHYSICAL EXAM General: Well developed, well nourished, female in no acute distress. Head: Normocephalic, atraumatic.  Neck: Supple without bruits, JVD not elevated. Lungs:  Resp regular and unlabored, CTA without wheezing or rales. Heart: RRR, S1, S2, no S3, S4, or murmur; no rub. Abdomen: Soft, non-tender, non-distended with normoactive bowel sounds. No hepatomegaly. No rebound/guarding. No obvious abdominal masses. Extremities: No clubbing, cyanosis, or edema. Distal pedal pulses are 2+ bilaterally. Neuro: Alert and oriented X 3. Moves all extremities spontaneously. Psych: Normal  affect.   LABS: CBC:  Recent Labs  03/24/15 2314 03/26/15 0532  WBC 8.9 7.0  NEUTROABS 6.0  --   HGB 10.8* 9.9*  HCT 33.4* 32.1*  MCV 89.8 92.0  PLT 224 198   INR:  Recent Labs  03/27/15 0250  INR 1.10   Basic Metabolic Panel:  Recent Labs  45/80/99 2314 03/25/15 0418  NA 140 137  K 3.2* 3.8  CL 105 104  CO2 22 27  GLUCOSE 118* 115*  BUN 6 <5*  CREATININE 0.55 0.56  CALCIUM 9.3 8.9   Liver Function Tests:  Recent Labs  03/24/15 2314  AST 30  ALT 20  ALKPHOS 110  BILITOT 0.6  PROT 7.5  ALBUMIN 4.0   Cardiac Enzymes:  Recent Labs  03/24/15 2314 03/25/15 0418 03/25/15 0945  TROPONINI 1.06* 0.85* 0.71*    Recent Labs  03/24/15 2339  TROPIPOC 0.49*   BNP:  B NATRIURETIC PEPTIDE  Date/Time Value Ref Range Status  03/25/2015 04:18 AM 68.4 0.0 - 100.0 pg/mL Final   Fasting Lipid Panel:  Recent Labs  03/25/15 0418  CHOL 201*  HDL 60  LDLCALC 68  TRIG 363*  CHOLHDL 3.4    TELE:   NSR with rate in 60's - 70's. No atopic events.      ECHO: 03/26/2015 Study Conclusions - Left ventricle: LVEF is approximately 50% with apical akinesis. Mural thrombus is again noted at  apex. The cavity size was normal. Wall thickness was normal. Doppler parameters are consistent with abnormal left ventricular relaxation (grade 1 diastolic dysfunction).   Current Medications:  . atorvastatin  40 mg Oral q1800  . carvedilol  25 mg Oral BID WC  . clopidogrel  75 mg Oral Daily  . enoxaparin (LOVENOX) injection  110 mg Subcutaneous Q24H  . isosorbide mononitrate  30 mg Oral Daily  . pantoprazole  40 mg Oral Daily  . sertraline  100 mg Oral Daily  . sucralfate  1 g Oral TID WC & HS  . Warfarin - Pharmacist Dosing Inpatient   Does not apply q1800      ASSESSMENT AND PLAN:  1. Chest pain-etiology of symptoms unclear.  - She has had continuous chest pain for approximately 60 hours. Her symptoms worsened with lying flat and also with palpation  of her sternum.  - EKG shows no acute changes. Troponin peaked at 1.06 on 03/24/2015.  - Presentation is similar to that in November 2016. At that time cardiac catheterization revealed no obstructive coronary disease - Echo showed EF of 50% with mural thrombus present. No pericardial effusion was noted.  2. Apical mural thrombus - noted again on recent echocardiogram - Resume Coumadin. Continue Lovenox. Per pharmacy dosing. - INR goal of 2-3. Currently 1.09 on 03/26/2015.  3. Anti-thrombin 3 deficiency -resume Coumadin.  4. CAD - s/p BMS to LAD in 2009 -continue Plavix, statin, BB, and Imdur.  5. Hypertension -continue present medications.  6. Chronic Normocytic Anemia - 10.8 on admission, 9.9 on 03/26/2015 - was in the 9.0's last admission - has a history of iron deficiency anemia - will check Ferritin.  Lorri Frederick , PA-C 9:13 AM 03/27/2015 Pager: [phone] Agree with assessment and plan above.  Historically she has been a very slow responder to warfarin. States that she has been taking both warfarin and lovenox at home for past several months because "they could never get the INR up to where it should be." Her chest pain is better. Now just a dull discomfort. Echo yesterday did not show any pericardial disease. Apical thrombus still noted. INR today 1.10. Discussed with patient. Will anticipate home tomorrow on warfarin and once a day lovenox (until INR therapeutic), to be followed closely in medical clinic where she has been followed for her clotting disorder.

## 2015-03-27 NOTE — Progress Notes (Signed)
ANTICOAGULATION CONSULT NOTE - Follow Up Consult  Pharmacy Consult for coumadin/lovenox Indication: apical mural thrombus, AT III deficiency  No Known Allergies  Patient Measurements: Height: 5\' 6"  (167.6 cm) Weight: 168 lb 3.2 oz (76.295 kg) IBW/kg (Calculated) : 59.3  Vital Signs: Temp: 98.2 F (36.8 C) (01/10 0528) Temp Source: Oral (01/10 0528) BP: 110/77 mmHg (01/10 0528) Pulse Rate: 68 (01/10 0528)  Labs:  Recent Labs  03/24/15 2314 03/25/15 0418 03/25/15 0945 03/26/15 0532 03/27/15 0250 03/27/15 1043  HGB 10.8*  --   --  9.9*  --  9.3*  HCT 33.4*  --   --  32.1*  --  30.0*  PLT 224  --   --  198  --  171  APTT 36  --   --   --   --   --   LABPROT 13.7  --   --  14.3 14.4  --   INR 1.03  --   --  1.09 1.10  --   CREATININE 0.55 0.56  --   --   --   --   TROPONINI 1.06* 0.85* 0.71*  --   --   --     Estimated Creatinine Clearance: 91.7 mL/min (by C-G formula based on Cr of 0.56).   Medications:  Scheduled:  . atorvastatin  40 mg Oral q1800  . carvedilol  25 mg Oral BID WC  . clopidogrel  75 mg Oral Daily  . enoxaparin (LOVENOX) injection  110 mg Subcutaneous Q24H  . isosorbide mononitrate  30 mg Oral Daily  . pantoprazole  40 mg Oral Daily  . sertraline  100 mg Oral Daily  . sucralfate  1 g Oral TID WC & HS  . Warfarin - Pharmacist Dosing Inpatient   Does not apply q1800    Assessment: 45 yo female here with CP on coumadin and lovenox at home for history ot apical mural thrombus and AT III deficiency. Patient noted with mural thrombus on echo. Patient has a supply of lovenox 120mg  syringes at home. =INR= 1.1, Hg= 9.3., plt= 171  -Home coumadin dose 15mg /day  Goal of Therapy:  INR 2-3 Monitor platelets by anticoagulation protocol: Yes   Plan:   -Coumadin 20mg  po today -Daily PT/INR  Harland German, Pharm D 03/27/2015 11:42 AM

## 2015-03-28 ENCOUNTER — Ambulatory Visit (HOSPITAL_COMMUNITY): Payer: Medicaid Other

## 2015-03-28 LAB — PROTIME-INR
INR: 1.36 (ref 0.00–1.49)
Prothrombin Time: 16.9 seconds — ABNORMAL HIGH (ref 11.6–15.2)

## 2015-03-28 MED ORDER — CLOPIDOGREL BISULFATE 75 MG PO TABS: 75.0000 mg | ORAL_TABLET | Freq: Every day | ORAL | Status: DC

## 2015-03-28 MED ORDER — ISOSORBIDE MONONITRATE ER 30 MG PO TB24: 30.0000 mg | ORAL_TABLET | Freq: Every day | ORAL | Status: DC

## 2015-03-28 MED ORDER — WARFARIN SODIUM 5 MG PO TABS: 15.0000 mg | ORAL_TABLET | Freq: Every day | ORAL | Status: DC

## 2015-03-28 MED ORDER — ALPRAZOLAM 0.25 MG PO TABS: 0.2500 mg | ORAL_TABLET | Freq: Every evening | ORAL | Status: DC | PRN

## 2015-03-28 MED ORDER — CARVEDILOL 25 MG PO TABS: 25.0000 mg | ORAL_TABLET | Freq: Two times a day (BID) | ORAL | Status: DC

## 2015-03-28 NOTE — Progress Notes (Signed)
Pt is stable. Prescriptions given to pt. Pt has been educated on the importance of follow up appointments and taking medications as prescribed. Pt has agreed to discharge teaching. Telemetry has been discontinued.

## 2015-03-28 NOTE — Care Management Note (Signed)
Case Management Note  Patient Details  Name: Lindsey [name] MRN: [id number] Date of Birth: Feb 07, 1971  Subjective/Objective:      Pt admitted with NSTEMI              Action/Plan:  Pt is independent from home with mom.  CM assessed pt and no CM needs determined prior to discharge   Expected Discharge Date:  03/27/15               Expected Discharge Plan:  Home/Self Care  In-House Referral:     Discharge planning Services  CM Consult  Post Acute Care Choice:    Choice offered to:     DME Arranged:    DME Agency:     HH Arranged:    HH Agency:     Status of Service:  Completed, signed off  Medicare Important Message Given:    Date Medicare IM Given:    Medicare IM give by:    Date Additional Medicare IM Given:    Additional Medicare Important Message give by:     If discussed at Long Length of Stay Meetings, dates discussed:    Additional Comments:  Cherylann Parr, RN 03/28/2015, 11:07 AM

## 2015-03-28 NOTE — Progress Notes (Signed)
Patient Name: Lindsey [name] Date of Encounter: 03/28/2015  Principal Problem:   NSTEMI (non-ST elevated myocardial infarction) [facility]) Active Problems:   CAD S/P  BMS to proximal LAD 2009-patent 01/29/15   Apical mural thrombus [facility])    Primary Cardiologist: Dr. Jens Som Patient Profile: 45 yo female w/ PMH of ATIII deficiency, HTN, CAD (s/p BMS to LAD in 2009,  repeat cath in 12/2011 and 01/2015 revealed patent stent), small apical mural thrombus by echo admitted on 03/24/2015 for chest pain.   SUBJECTIVE: Reports improvement in her chest pain, saying it mostly feels numb today. Denies any dyspnea or palpitations.  OBJECTIVE Filed Vitals:   03/27/15 0528 03/27/15 2021 03/28/15 0432 03/28/15 0434  BP: 110/77 116/75 106/67   Pulse: 68 72 66   Temp: 98.2 F (36.8 C) 97.9 F (36.6 C) 98.4 F (36.9 C)   TempSrc: Oral Oral Oral   Resp: 18 18 18    Height:      Weight: 168 lb 3.2 oz (76.295 kg)   169 lb 12.1 oz (77 kg)  SpO2: 99% 100% 100%     Intake/Output Summary (Last 24 hours) at 03/28/15 0828 Last data filed at 03/28/15 0753  Gross per 24 hour  Intake    720 ml  Output    350 ml  Net    370 ml   Filed Weights   03/26/15 0438 03/27/15 0528 03/28/15 0434  Weight: 165 lb 9.6 oz (75.116 kg) 168 lb 3.2 oz (76.295 kg) 169 lb 12.1 oz (77 kg)    PHYSICAL EXAM General: Well developed, well nourished, female in no acute distress. Head: Normocephalic, atraumatic.  Neck: Supple without bruits, JVD not elevated. Lungs:  Resp regular and unlabored, CTA without wheezing or rales. Heart: RRR, S1, S2, no S3, S4, or murmur; no rub. Abdomen: Soft, non-tender, non-distended with normoactive bowel sounds. No hepatomegaly. No rebound/guarding. No obvious abdominal masses. Extremities: No clubbing, cyanosis, or edema. Distal pedal pulses are 2+ bilaterally. Neuro: Alert and oriented X 3. Moves all extremities spontaneously. Psych: Normal affect.   LABS: CBC:  Recent Labs  03/26/15 0532 03/27/15 1043  WBC 7.0 6.5  HGB 9.9* 9.3*  HCT 32.1* 30.0*  MCV 92.0 91.2  PLT 198 171   INR:  Recent Labs  03/28/15 0339  INR 1.36     Cardiac Enzymes:  Recent Labs  03/25/15 0945  TROPONINI 0.71*   BNP:  B NATRIURETIC PEPTIDE  Date/Time Value Ref Range Status  03/25/2015 04:18 AM 68.4 0.0 - 100.0 pg/mL Final    TELE: NSR with rate in 60's - 70's. No acute events.   ECHO: 03/26/2015 Study Conclusions - Left ventricle: LVEF is approximately 50% with apical akinesis. Mural thrombus is again noted at apex. The cavity size was normal. Wall thickness was normal. Doppler parameters are consistent with abnormal left ventricular relaxation (grade 1 diastolic dysfunction).   Current Medications:  . atorvastatin  40 mg Oral q1800  . carvedilol  25 mg Oral BID WC  . clopidogrel  75 mg Oral Daily  . enoxaparin (LOVENOX) injection  110 mg Subcutaneous Q24H  . isosorbide mononitrate  30 mg Oral Daily  . pantoprazole  40 mg Oral Daily  . sertraline  100 mg Oral Daily  . sucralfate  1 g Oral TID WC & HS  . Warfarin - Pharmacist Dosing Inpatient   Does not apply q1800      ASSESSMENT AND PLAN:  1. Chest pain-etiology of symptoms unclear.  -  She has had continuous chest pain for approximately 60 hours. Her symptoms worsened with lying flat and also with palpation of her sternum.  - EKG shows no acute changes. Troponin peaked at 1.06 on 03/24/2015.  - Presentation is similar to that in November 2016. At that time cardiac catheterization revealed no obstructive coronary disease - Echo showed EF of 50% with mural thrombus present. No pericardial effusion was noted. - Patient has been taking PRN Xanax while here to help with sleep at night and her anxiety regarding her medical conditions. She requests a 30-day Rx at discharge. It was discussed in detail with the patient this is not something she should take regularly and she would not be able to obtain  refills from Cardiology going forward.  2. Apical mural thrombus - noted again on recent echocardiogram - Resume Coumadin. Continue Lovenox. Per pharmacy dosing. - INR goal of 2-3. Currently 1.36 on 03/28/2015. Spoke with pharmacy who recommended she be on Lovenox  and Coumadin  going home until her next INR check. She wishes to follow-up with [facility] for her INR checks. INR check scheduled on 04/02/2015 at 11:15AM.  3. Anti-thrombin 3 deficiency -resume Coumadin.  4. CAD - s/p BMS to LAD in 2009 -continue Plavix, statin, BB, Ranexa and Imdur.  5. Hypertension -continue present medications.  6. Chronic Normocytic Anemia - 10.8 on admission, 9.3 on 03/27/2015 - was in the 9.0's last admission - has a history of iron deficiency anemia. Ferritin low normal at 25. - recommended starting OTC iron supplementation at discharge. - will follow-up with her Hematologist    Signed, Ellsworth Lennox , PA-C 8:28 AM 03/28/2015 Chest pain has gradually resolved this admission. INR is improving slowly. She will be on warfarin and Lovenox at home until her INR is therapeutic >2.0.  She will get followup INRs at the [facility]. Office. Okay for discharge today. Agree with above assessment and plan.

## 2015-03-28 NOTE — Discharge Summary (Signed)
CARDIOLOGY DISCHARGE SUMMARY   Patient ID: Lindsey [name] MRN: [id number] DOB/AGE: [birth year] 45 y.o.  Admit date: 03/24/2015 Discharge date: 03/28/2015  PCP: [name], MD Primary Cardiologist: Dr. Jens Som  Primary Discharge Diagnosis: NSTEMI (non-ST elevated myocardial infarction) [facility]) Secondary Discharge Diagnosis: CAD S/P  BMS to proximal LAD 2009-patent 01/29/15, Apical mural thrombus (HCC)   Consults: None Procedures: Transesophageal Echocardiogram  Hospital Course: Lindsey [name] is a 45 y.o. female with past medical history of ATIII deficiency, HTN, CAD (s/p BMS to LAD in 2009,repeat cath in 12/2011 and 01/2015 revealed patent stent), and small apical mural thrombus by echo who presented to [facility] ED on 03/25/2015 for chest pain. Her initial troponin was elevated to 1.06 and she was transferred to [facility] for further evaluation.  She reported her chest pain had been constant for over the past 48 hours, increasing in intensity. She did report mild relief with Tylenol and ASA. She reported having not taken her Coumadin for two days due to nausea and her INR was decreased to 1.03 (goal of 2.0 -3.0). Her EKG showed no acute ischemic changes.  Her Carvedilol was increased from 12.5mg  BID to 25mg  BID and her Imdur from 15mg  daily to 30mg  daily. Her Ranexa was discontinued at this time (granted it appears to have been discontinued by Dr. Jens Som on 02/23/2015 but she was still taking the medication).  The following morning, she still reported having chest pain. Her pain was noted to be reproducible on palpation. Her troponin values were trending down to 0.85 and 0.71. Due to her recent negative cath in 01/2015 and constant pain relieved with Tylenol, further invasive work-up was not pursued. An echocardiogram was obtained to check for possible pericardial effusion. Her echo showed an EF of 50%, stable mural thrombus, and no pericardial effusion. Pharmacy was  consulted to help with the management of her Coumadin and Lovenox since her INR was not at goal.  She had improvement in her pain throughout the rest of admission. She denied any dyspnea, nausea, vomiting, or diaphoresis. On 03/26/2014 her INR was still only at 1.09. She also reported a history of iron deficiency anemia. Her Ferritin was checked and came back low-normal at 25. She was advised to take OTC Ferrous Sulfate for this and to follow-up with her Hematologist in regards to her anemia in the next two weeks.  On 03/28/2015, she reported her pain had resolved and felt mostly like a numbness sensation in her chest. She again denied any other anginal symptoms. Vitals and lab results were reviewed and appeared stable. Her INR had increased to 1.36. Pharmacy recommended discharging on Lovenox 120mg  daily and Coumadin 15mg  daily until her INR reaches goal. She now prefers to ger her INR checks at [facility] and an appointment was scheduled on 04/02/2015 at 11:15AM.   The patient was last examined by Dr. Patty Sermons and deemed stable for discharge. She was given a new Rx for her increased Carvedilol dosage and refills for her Plavix and Coumadin. She had received Xanax 0.25mg  PRN for sleep and anxiety while admitted and requested a refill of the medication going home. She was given one Rx for 30 pills with no refills and instructed any refills would need to come from her Primary Care provider. She reported having scheduled Cardiology follow-up with Dr. Jens Som in June 2017 and preferred to keep that appointment and to call if needing to be seen sooner.  Labs:   Lab Results  Component Value  Date   WBC 6.5 03/27/2015   HGB 9.3* 03/27/2015   HCT 30.0* 03/27/2015   MCV 91.2 03/27/2015   PLT 171 03/27/2015     Recent Labs Lab 03/24/15 2314 03/25/15 0418  NA 140 137  K 3.2* 3.8  CL 105 104  CO2 22 27  BUN 6 <5*  CREATININE 0.55 0.56  CALCIUM 9.3 8.9  PROT 7.5  --   BILITOT 0.6  --   ALKPHOS  110  --   ALT 20  --   AST 30  --   GLUCOSE 118* 115*   Lipid Panel     Component Value Date/Time   CHOL 201* 03/25/2015 0418   TRIG 363* 03/25/2015 0418   HDL 60 03/25/2015 0418   CHOLHDL 3.4 03/25/2015 0418   VLDL 73* 03/25/2015 0418   LDLCALC 68 03/25/2015 0418    B NATRIURETIC PEPTIDE  Date/Time Value Ref Range Status  03/25/2015 04:18 AM 68.4 0.0 - 100.0 pg/mL Final    Recent Labs  03/28/15 0339  INR 1.36      Radiology:  Dg Chest Port 1 View: 03/24/2015  CLINICAL DATA:  Shortness of breath, chest pain and nausea. EXAM: PORTABLE CHEST 1 VIEW COMPARISON:  01/28/2015 FINDINGS: The heart size and mediastinal contours are within normal limits. Both lungs are clear. The visualized skeletal structures are unremarkable. IMPRESSION: No active disease. Electronically Signed   By: Ted Mcalpine M.D.   On: 03/24/2015 23:33   EKG: NSR, HR in 70's. No acute changes since previous tracing.  Echo: 03/26/2015 Study Conclusions - Left ventricle: LVEF is approximately 50% with apical akinesis. Mural thrombus is again noted at apex. The cavity size was normal. Wall thickness was normal. Doppler parameters are consistent with abnormal left ventricular relaxation (grade 1 diastolic dysfunction).  FOLLOW UP PLANS AND APPOINTMENTS No Known Allergies   Medication List    STOP taking these medications        amoxicillin 500 MG capsule  Commonly known as:  AMOXIL     clonazePAM 1 MG tablet  Commonly known as:  KLONOPIN     doxycycline 100 MG tablet  Commonly known as:  VIBRA-TABS     ranolazine 500 MG 12 hr tablet  Commonly known as:  RANEXA      TAKE these medications        acetaminophen 500 MG tablet  Commonly known as:  TYLENOL  Take 1,000 mg by mouth every 6 (six) hours as needed for headache (pain).     ALPRAZolam 0.25 MG tablet  Commonly known as:  XANAX  Take 1 tablet (0.25 mg total) by mouth at bedtime as needed for anxiety or sleep.      atorvastatin 40 MG tablet  Commonly known as:  LIPITOR  Take 1 tablet (40 mg total) by mouth daily.     carvedilol 25 MG tablet  Commonly known as:  COREG  Take 1 tablet (25 mg total) by mouth 2 (two) times daily with a meal.     clopidogrel 75 MG tablet  Commonly known as:  PLAVIX  Take 1 tablet (75 mg total) by mouth daily.     enoxaparin 120 MG/0.8ML injection  Commonly known as:  LOVENOX  Inject 0.8 mLs (120 mg total) into the skin daily. Inject 0.46mls into the skin daily.     hydrocortisone-pramoxine rectal foam  Commonly known as:  PROCTOFOAM-HC  Place 1 applicator rectally 2 (two) times daily.     isosorbide mononitrate 30 MG  24 hr tablet  Commonly known as:  IMDUR  Take 1 tablet (30 mg total) by mouth daily.     nitroGLYCERIN 0.4 MG SL tablet  Commonly known as:  NITROSTAT  Place 1 tablet (0.4 mg total) under the tongue every 5 (five) minutes as needed for chest pain (up to 3 doses).     ondansetron 4 MG disintegrating tablet  Commonly known as:  ZOFRAN ODT  Take 1 tablet (4 mg total) by mouth every 8 (eight) hours as needed for nausea or vomiting.     pantoprazole 40 MG tablet  Commonly known as:  PROTONIX  Take 1 tablet (40 mg total) by mouth daily.     sertraline 50 MG tablet  Commonly known as:  ZOLOFT  Take 100 mg by mouth daily.     sucralfate 1 GM/10ML suspension  Commonly known as:  CARAFATE  Take 10 mLs (1 g total) by mouth 4 (four) times daily -  with meals and at bedtime.     warfarin 5 MG tablet  Commonly known as:  COUMADIN  Take 3 tablets (15 mg total) by mouth daily at 6 PM.         Follow-up Information    Follow up with [facility] On 04/02/2015.   Specialty:  Cardiology   Why:  INR check scheduled on 04/02/2015 at 11:15AM.   Contact information:   [address] [place] [address] [phone]      Follow up with Olga Millers, MD.   Specialty:  Cardiology   Why:  Call if needed  to be seen prior to your 9-month follow-up.   Contact information:   [address] [place] [address] [phone]       BRING ALL MEDICATIONS WITH YOU TO FOLLOW UP APPOINTMENTS  Time spent with patient to include physician time: 40 minutes Signed: Ellsworth Lennox, PA 03/28/2015, 2:15 PM Co-Sign MD

## 2015-03-28 NOTE — Discharge Instructions (Signed)
Iron-Rich Diet Iron is a mineral that helps your body to produce hemoglobin. Hemoglobin is a protein in your red blood cells that carries oxygen to your body's tissues. Eating too little iron may cause you to feel weak and tired, and it can increase your risk for infection. Eating enough iron is necessary for your body's metabolism, muscle function, and nervous system. Iron is naturally found in many foods. It can also be added to foods or fortified in foods. There are two types of dietary iron:  Heme iron. Heme iron is absorbed by the body more easily than nonheme iron. Heme iron is found in meat, poultry, and fish.  Nonheme iron. Nonheme iron is found in dietary supplements, iron-fortified grains, beans, and vegetables. You may need to follow an iron-rich diet if:  You have been diagnosed with iron deficiency or iron-deficiency anemia.  You have a condition that prevents you from absorbing dietary iron, such as:  Infection in your intestines.  Celiac disease. This involves long-lasting (chronic) inflammation of your intestines.  You do not eat enough iron.  You eat a diet that is high in foods that impair iron absorption.  You have lost a lot of blood.  You have heavy bleeding during your menstrual cycle.  You are pregnant. WHAT IS MY PLAN? Your health care provider may help you to determine how much iron you need per day based on your condition. Generally, when a person consumes sufficient amounts of iron in the diet, the following iron needs are met:  Men.  14-18 years old: 11 mg per day.  19-50 years old: 8 mg per day.  Women.   14-18 years old: 15 mg per day.  19-50 years old: 18 mg per day.  Over 50 years old: 8 mg per day.  Pregnant women: 27 mg per day.  Breastfeeding women: 9 mg per day. WHAT DO I NEED TO KNOW ABOUT AN IRON-RICH DIET?  Eat fresh fruits and vegetables that are high in vitamin C along with foods that are high in iron. This will help increase  the amount of iron that your body absorbs from food, especially with foods containing nonheme iron. Foods that are high in vitamin C include oranges, peppers, tomatoes, and mango.  Take iron supplements only as directed by your health care provider. Overdose of iron can be life-threatening. If you were prescribed iron supplements, take them with orange juice or a vitamin C supplement.  Cook foods in pots and pans that are made from iron.   Eat nonheme iron-containing foods alongside foods that are high in heme iron. This helps to improve your iron absorption.   Certain foods and drinks contain compounds that impair iron absorption. Avoid eating these foods in the same meal as iron-rich foods or with iron supplements. These include:  Coffee, black tea, and red wine.  Milk, dairy products, and foods that are high in calcium.  Beans, soybeans, and peas.  Whole grains.  When eating foods that contain both nonheme iron and compounds that impair iron absorption, follow these tips to absorb iron better.   Soak beans overnight before cooking.  Soak whole grains overnight and drain them before using.  Ferment flours before baking, such as using yeast in bread dough. WHAT FOODS CAN I EAT? Grains Iron-fortified breakfast cereal. Iron-fortified whole-wheat bread. Enriched rice. Sprouted grains. Vegetables Spinach. Potatoes with skin. Green peas. Broccoli. Red and green bell peppers. Fermented vegetables. Fruits Prunes. Raisins. Oranges. Strawberries. Mango. Grapefruit. Meats and Other Protein   Sources Beef liver. Oysters. Beef. Shrimp. Turkey. Chicken. Tuna. Sardines. Chickpeas. Nuts. Tofu. Beverages Tomato juice. Fresh orange juice. Prune juice. Hibiscus tea. Fortified instant breakfast shakes. Condiments Tahini. Fermented soy sauce. Sweets and Desserts Black-strap molasses.  Other Wheat germ. The items listed above may not be a complete list of recommended foods or  beverages. Contact your dietitian for more options. WHAT FOODS ARE NOT RECOMMENDED? Grains Whole grains. Bran cereal. Bran flour. Oats. Vegetables Artichokes. Brussels sprouts. Kale. Fruits Blueberries. Raspberries. Strawberries. Figs. Meats and Other Protein Sources Soybeans. Products made from soy protein. Dairy Milk. Cream. Cheese. Yogurt. Cottage cheese. Beverages Coffee. Black tea. Red wine. Sweets and Desserts Cocoa. Chocolate. Ice cream. Other Basil. Oregano. Parsley. The items listed above may not be a complete list of foods and beverages to avoid. Contact your dietitian for more information.   This information is not intended to replace advice given to you by your health care provider. Make sure you discuss any questions you have with your health care provider.   Document Released: 10/15/2004 Document Revised: 03/24/2014 Document Reviewed: 09/28/2013 Elsevier Interactive Patient Education 2016 [employer]  

## 2015-03-30 ENCOUNTER — Ambulatory Visit (HOSPITAL_COMMUNITY): Payer: Medicaid Other

## 2015-04-02 ENCOUNTER — Ambulatory Visit (HOSPITAL_COMMUNITY): Payer: Medicaid Other

## 2015-04-04 ENCOUNTER — Ambulatory Visit (HOSPITAL_COMMUNITY): Payer: Medicaid Other

## 2015-04-06 ENCOUNTER — Ambulatory Visit (HOSPITAL_COMMUNITY): Payer: Medicaid Other

## 2015-04-08 ENCOUNTER — Emergency Department (HOSPITAL_BASED_OUTPATIENT_CLINIC_OR_DEPARTMENT_OTHER): Admit: 2015-04-08 | Discharge: 2015-04-08 | Disposition: A | Discharge status: Home / Self Care | Payer: Medicaid Other

## 2015-04-08 ENCOUNTER — Encounter (HOSPITAL_COMMUNITY): Payer: Self-pay

## 2015-04-08 ENCOUNTER — Emergency Department (HOSPITAL_COMMUNITY)
Admission: EM | Admit: 2015-04-08 | Discharge: 2015-04-08 | Disposition: A | Discharge status: Home / Self Care | Payer: Medicaid Other | Attending: Emergency Medicine | Admitting: Emergency Medicine

## 2015-04-08 ENCOUNTER — Emergency Department (HOSPITAL_COMMUNITY): Payer: Medicaid Other

## 2015-04-08 DIAGNOSIS — M5441 Lumbago with sciatica, right side: Secondary | ICD-10-CM

## 2015-04-08 DIAGNOSIS — M79606 Pain in leg, unspecified: Secondary | ICD-10-CM

## 2015-04-08 DIAGNOSIS — Z7901 Long term (current) use of anticoagulants: Secondary | ICD-10-CM | POA: Diagnosis not present

## 2015-04-08 DIAGNOSIS — F419 Anxiety disorder, unspecified: Secondary | ICD-10-CM | POA: Diagnosis not present

## 2015-04-08 DIAGNOSIS — I252 Old myocardial infarction: Secondary | ICD-10-CM | POA: Insufficient documentation

## 2015-04-08 DIAGNOSIS — M79604 Pain in right leg: Secondary | ICD-10-CM | POA: Diagnosis present

## 2015-04-08 DIAGNOSIS — Z7902 Long term (current) use of antithrombotics/antiplatelets: Secondary | ICD-10-CM | POA: Diagnosis not present

## 2015-04-08 DIAGNOSIS — Z9889 Other specified postprocedural states: Secondary | ICD-10-CM | POA: Insufficient documentation

## 2015-04-08 DIAGNOSIS — M5440 Lumbago with sciatica, unspecified side: Secondary | ICD-10-CM | POA: Insufficient documentation

## 2015-04-08 DIAGNOSIS — Z9851 Tubal ligation status: Secondary | ICD-10-CM | POA: Diagnosis not present

## 2015-04-08 DIAGNOSIS — N390 Urinary tract infection, site not specified: Secondary | ICD-10-CM | POA: Diagnosis not present

## 2015-04-08 DIAGNOSIS — R109 Unspecified abdominal pain: Secondary | ICD-10-CM | POA: Diagnosis not present

## 2015-04-08 DIAGNOSIS — I2510 Atherosclerotic heart disease of native coronary artery without angina pectoris: Secondary | ICD-10-CM | POA: Insufficient documentation

## 2015-04-08 DIAGNOSIS — M549 Dorsalgia, unspecified: Secondary | ICD-10-CM

## 2015-04-08 DIAGNOSIS — Z87891 Personal history of nicotine dependence: Secondary | ICD-10-CM | POA: Insufficient documentation

## 2015-04-08 DIAGNOSIS — Z3202 Encounter for pregnancy test, result negative: Secondary | ICD-10-CM | POA: Diagnosis not present

## 2015-04-08 DIAGNOSIS — Z8673 Personal history of transient ischemic attack (TIA), and cerebral infarction without residual deficits: Secondary | ICD-10-CM | POA: Diagnosis not present

## 2015-04-08 DIAGNOSIS — Z9861 Coronary angioplasty status: Secondary | ICD-10-CM | POA: Diagnosis not present

## 2015-04-08 DIAGNOSIS — Z862 Personal history of diseases of the blood and blood-forming organs and certain disorders involving the immune mechanism: Secondary | ICD-10-CM | POA: Diagnosis not present

## 2015-04-08 LAB — URINALYSIS, ROUTINE W REFLEX MICROSCOPIC
Glucose, UA: NEGATIVE mg/dL
KETONES UR: 15 mg/dL — AB
NITRITE: NEGATIVE
PROTEIN: 30 mg/dL — AB
Specific Gravity, Urine: 1.038 — ABNORMAL HIGH (ref 1.005–1.030)
pH: 5 (ref 5.0–8.0)

## 2015-04-08 LAB — CBC
HCT: 35.1 % — ABNORMAL LOW (ref 36.0–46.0)
Hemoglobin: 11.2 g/dL — ABNORMAL LOW (ref 12.0–15.0)
MCH: 29 pg (ref 26.0–34.0)
MCHC: 31.9 g/dL (ref 30.0–36.0)
MCV: 90.9 fL (ref 78.0–100.0)
PLATELETS: 268 10*3/uL (ref 150–400)
RBC: 3.86 MIL/uL — AB (ref 3.87–5.11)
RDW: 21.2 % — AB (ref 11.5–15.5)
WBC: 5.7 10*3/uL (ref 4.0–10.5)

## 2015-04-08 LAB — BASIC METABOLIC PANEL
Anion gap: 14 (ref 5–15)
BUN: 6 mg/dL (ref 6–20)
CHLORIDE: 103 mmol/L (ref 101–111)
CO2: 22 mmol/L (ref 22–32)
CREATININE: 0.82 mg/dL (ref 0.44–1.00)
Calcium: 9.3 mg/dL (ref 8.9–10.3)
Glucose, Bld: 144 mg/dL — ABNORMAL HIGH (ref 65–99)
Potassium: 3.5 mmol/L (ref 3.5–5.1)
SODIUM: 139 mmol/L (ref 135–145)

## 2015-04-08 LAB — I-STAT TROPONIN, ED: TROPONIN I, POC: 0.01 ng/mL (ref 0.00–0.08)

## 2015-04-08 LAB — PROTIME-INR
INR: 1.26 (ref 0.00–1.49)
PROTHROMBIN TIME: 15.9 s — AB (ref 11.6–15.2)

## 2015-04-08 LAB — PREGNANCY, URINE: Preg Test, Ur: NEGATIVE

## 2015-04-08 LAB — URINE MICROSCOPIC-ADD ON

## 2015-04-08 MED ORDER — CEPHALEXIN 500 MG PO CAPS: 500.0000 mg | ORAL_CAPSULE | Freq: Two times a day (BID) | ORAL | Status: DC

## 2015-04-08 MED ORDER — SODIUM CHLORIDE 0.9 % IV BOLUS (SEPSIS)
500.0000 mL | Freq: Once | INTRAVENOUS | Status: AC
  Administered 2015-04-08: 500 mL via INTRAVENOUS

## 2015-04-08 MED ORDER — OXYCODONE-ACETAMINOPHEN 5-325 MG PO TABS
2.0000 | ORAL_TABLET | Freq: Once | ORAL | Status: AC
  Administered 2015-04-08: 2 via ORAL
  Filled 2015-04-08: qty 2

## 2015-04-08 MED ORDER — PHENAZOPYRIDINE HCL 200 MG PO TABS: 200.0000 mg | ORAL_TABLET | Freq: Three times a day (TID) | ORAL | Status: DC

## 2015-04-08 MED ORDER — CEPHALEXIN 250 MG PO CAPS
500.0000 mg | ORAL_CAPSULE | Freq: Once | ORAL | Status: AC
  Administered 2015-04-08: 500 mg via ORAL
  Filled 2015-04-08: qty 2

## 2015-04-08 MED ORDER — PHENAZOPYRIDINE HCL 100 MG PO TABS
95.0000 mg | ORAL_TABLET | Freq: Once | ORAL | Status: AC
  Administered 2015-04-08: 100 mg via ORAL
  Filled 2015-04-08: qty 1

## 2015-04-08 MED ORDER — MORPHINE SULFATE (PF) 4 MG/ML IV SOLN
4.0000 mg | Freq: Once | INTRAVENOUS | Status: AC
  Administered 2015-04-08: 4 mg via INTRAVENOUS
  Filled 2015-04-08: qty 1

## 2015-04-08 MED ORDER — OXYCODONE-ACETAMINOPHEN 5-325 MG PO TABS: 2.0000 | ORAL_TABLET | ORAL | Status: DC | PRN

## 2015-04-08 NOTE — ED Notes (Signed)
Pt. Has a hx of blood clots and she developed rt. Leg pain yesterday into her rt. Flank area.  Today she is unable to stand on it.  She is also having difficulty voiding due to the pain. She did urinate prior to coming.  She is also having sob . She denies any chest pain.  She is on Coumadin and  Lovenox.  Rt. Leg has no swelling redness to warmth noted to it. She is able to wiggle her toes

## 2015-04-08 NOTE — Progress Notes (Signed)
VASCULAR LAB PRELIMINARY  PRELIMINARY  PRELIMINARY  PRELIMINARY  Right lower extremity venous duplex completed.    Preliminary report:  There is no DVT or SVT noted in the right lower extremity.   Lindsey [name], RVT 04/08/2015, 12:49 PM

## 2015-04-08 NOTE — ED Notes (Signed)
Lab to add on urine pregnancy test per request from CT.

## 2015-04-08 NOTE — ED Notes (Signed)
Pt called nurse to room c/o unbearable pain when trying to move in bed.  Will update PA.

## 2015-04-08 NOTE — ED Provider Notes (Signed)
CSN: [id number]     Arrival date & time 04/08/15  1025 History   First MD Initiated Contact with Patient 04/08/15 1134     Chief Complaint  Patient presents with  . Leg Pain   (Consider location/radiation/quality/duration/timing/severity/associated sxs/prior Treatment) The history is provided by the patient. No language interpreter was used.    Lindsey [name] is a 46 y.o female with a history of stroke, hypertension, MI 2 with stenting, weight antithrombin III deficiency (on Coumadin and Lovenox) who presents for gradual onset worsening right posterior leg pain that began yesterday and is radiating from the right side of her back. He reports that she was unable to sleep last night secondary to pain. She is also complaining of right flank pain that is worse with movement and coughing. She reports dysuria for several months and has a follow-up urology appointment in a couple of weeks. Recently, she has had blood in her urine. She states that she's been drinking plenty of water but is having difficulty urinating with only a few drops this morning. She denies any fever, chills, chest pain, abdominal pain, nausea, vomiting.   Past Medical History  Diagnosis Date  . Stroke (HCC)     assoc with short term memory loss and right peripheral vision loss; age 66   . Blood transfusion   . Anxiety   . Coronary artery disease     Apical LAD infarction '00; NSTEMI s/p BMS to prox LAD '09; Cath 12/2011 single vessel CAD w/ patent LAD stent w/ stable mild ISR, nl LV systolic fxn  . Anemia   . Antithrombin III deficiency (HCC)     ?pt not sure if true diagnosis  . Tobacco abuse   . High blood pressure   . Myocardial infarction [facility])    Past Surgical History  Procedure Laterality Date  . Tubal ligation    . Cardiac catheterization    . Esophagogastroduodenoscopy N/A 06/09/2012    Procedure: ESOPHAGOGASTRODUODENOSCOPY (EGD);  Surgeon: Theda Belfast, MD;  Location: [facility] ENDOSCOPY;  Service: Endoscopy;   Laterality: N/A;  . Coronary angioplasty    . Esophagogastroduodenoscopy N/A 08/02/2013    Procedure: ESOPHAGOGASTRODUODENOSCOPY (EGD);  Surgeon: Meryl Dare, MD;  Location: [facility] ENDOSCOPY;  Service: Endoscopy;  Laterality: N/A;  . Left heart catheterization with coronary angiogram N/A 12/24/2011    Procedure: LEFT HEART CATHETERIZATION WITH CORONARY ANGIOGRAM;  Surgeon: Kathleene Hazel, MD;  Location: [facility] CATH LAB;  Service: Cardiovascular;  Laterality: N/A;  . Colonoscopy N/A 03/29/2014    Procedure: COLONOSCOPY;  Surgeon: Louis Meckel, MD;  Location: [facility] ENDOSCOPY;  Service: Endoscopy;  Laterality: N/A;  . Cardiac catheterization N/A 01/29/2015    Procedure: Left Heart Cath and Coronary Angiography;  Surgeon: Peter M Swaziland, MD;  Location: [facility] INVASIVE CV LAB;  Service: Cardiovascular;  Laterality: N/A;   Family History  Problem Relation Age of Onset  . Heart disease Brother     arrhythmia; died  . Breast cancer Maternal Aunt    Social History  Substance Use Topics  . Smoking status: Former Smoker -- 0.20 packs/day for 1 years    Types: Cigarettes    Quit date: 11/26/2014  . Smokeless tobacco: Never Used     Comment: STOPPED SMOKING IN SEPTEMBER 2016  . Alcohol Use: 0.0 oz/week    0 Cans of beer per week   OB History    No data available     Review of Systems  All other systems reviewed and are negative.  Allergies  Review of patient's allergies indicates no known allergies.  Home Medications   Prior to Admission medications   Medication Sig Start Date End Date Taking? Authorizing Provider  acetaminophen (TYLENOL) 500 MG tablet Take 1,000 mg by mouth every 6 (six) hours as needed for headache (pain).   Yes Historical Provider, MD  ALPRAZolam (XANAX) 0.25 MG tablet Take 1 tablet (0.25 mg total) by mouth at bedtime as needed for anxiety or sleep. 03/28/15  Yes Ellsworth Lennox, PA  amLODipine (NORVASC) 5 MG tablet Take 5 mg by mouth daily.   Yes Historical  Provider, MD  atorvastatin (LIPITOR) 40 MG tablet Take 1 tablet (40 mg total) by mouth daily. 02/01/15  Yes Darreld Mclean, MD  carvedilol (COREG) 25 MG tablet Take 1 tablet (25 mg total) by mouth 2 (two) times daily with a meal. 03/28/15  Yes Ellsworth Lennox, PA  clopidogrel (PLAVIX) 75 MG tablet Take 1 tablet (75 mg total) by mouth daily. 03/28/15  Yes Ellsworth Lennox, PA  enoxaparin (LOVENOX) 120 MG/0.8ML injection Inject 0.8 mLs (120 mg total) into the skin daily. Inject 0.54mls into the skin daily. 02/20/15  Yes Ejiroghene Wendall Stade, MD  hydrocortisone-pramoxine (PROCTOFOAM-HC) rectal foam Place 1 applicator rectally 2 (two) times daily. Patient taking differently: Place 1 applicator rectally daily as needed for hemorrhoids.  06/15/14  Yes Devoria Albe, MD  isosorbide mononitrate (IMDUR) 30 MG 24 hr tablet Take 1 tablet (30 mg total) by mouth daily. 03/28/15  Yes Ellsworth Lennox, PA  nitroGLYCERIN (NITROSTAT) 0.4 MG SL tablet Place 1 tablet (0.4 mg total) under the tongue every 5 (five) minutes as needed for chest pain (up to 3 doses). 10/14/13  Yes Scott T Alben Spittle, PA-C  ondansetron (ZOFRAN ODT) 4 MG disintegrating tablet Take 1 tablet (4 mg total) by mouth every 8 (eight) hours as needed for nausea or vomiting. 09/10/14  Yes Kaitlyn Szekalski, PA-C  pantoprazole (PROTONIX) 40 MG tablet Take 1 tablet (40 mg total) by mouth daily. 02/01/15  Yes Darreld Mclean, MD  sertraline (ZOLOFT) 50 MG tablet Take 100 mg by mouth daily.   Yes Historical Provider, MD  sucralfate (CARAFATE) 1 GM/10ML suspension Take 10 mLs (1 g total) by mouth 4 (four) times daily -  with meals and at bedtime. 12/22/14  Yes Darreld Mclean, MD  warfarin (COUMADIN) 5 MG tablet Take 3 tablets (15 mg total) by mouth daily at 6 PM. 03/28/15  Yes Ellsworth Lennox, PA  cephALEXin (KEFLEX) 500 MG capsule Take 1 capsule (500 mg total) by mouth 2 (two) times daily. 04/08/15   Kaira Stringfield Patel-Mills, PA-C  oxyCODONE-acetaminophen (PERCOCET/ROXICET)  5-325 MG tablet Take 2 tablets by mouth every 4 (four) hours as needed for severe pain. 04/08/15   Cheyla Duchemin Patel-Mills, PA-C  phenazopyridine (PYRIDIUM) 200 MG tablet Take 1 tablet (200 mg total) by mouth 3 (three) times daily. 04/08/15   Kary Colaizzi Patel-Mills, PA-C   BP 137/96 mmHg  Pulse 63  Temp(Src) 99 F (37.2 C) (Oral)  Resp 16  Ht 5\' 6"  (1.676 m)  Wt 73.936 kg  BMI 26.32 kg/m2  SpO2 98%  LMP 04/02/2015 Physical Exam  Constitutional: She is oriented to person, place, and time. She appears well-developed and well-nourished.  HENT:  Head: Normocephalic and atraumatic.  Eyes: Conjunctivae are normal.  Neck: Normal range of motion. Neck supple.  Cardiovascular: Normal rate, regular rhythm and normal heart sounds.   Pulmonary/Chest: Effort normal and breath sounds normal.  Abdominal: Soft. There is no tenderness.  There is CVA tenderness.  Right flank pain to palpation. No abdominal tenderness. No guarding or rebound.  Musculoskeletal: Normal range of motion.  Able to ambulate but cannot put much pressure on the right leg secondary to pain. No significant swelling, erythema, or joint effusion in the right leg. Tenderness to palpation of the posterior thigh. 2+ DP pulse.  Neurological: She is alert and oriented to person, place, and time.  Skin: Skin is warm and dry.  Nursing note and vitals reviewed.   ED Course  Procedures (including critical care time) Labs Review Labs Reviewed  BASIC METABOLIC PANEL - Abnormal; Notable for the following:    Glucose, Bld 144 (*)    All other components within normal limits  CBC - Abnormal; Notable for the following:    RBC 3.86 (*)    Hemoglobin 11.2 (*)    HCT 35.1 (*)    RDW 21.2 (*)    All other components within normal limits  PROTIME-INR - Abnormal; Notable for the following:    Prothrombin Time 15.9 (*)    All other components within normal limits  URINALYSIS, ROUTINE W REFLEX MICROSCOPIC (NOT AT [facility]) - Abnormal; Notable for the  following:    Color, Urine ORANGE (*)    APPearance CLOUDY (*)    Specific Gravity, Urine 1.038 (*)    Hgb urine dipstick LARGE (*)    Bilirubin Urine MODERATE (*)    Ketones, ur 15 (*)    Protein, ur 30 (*)    Leukocytes, UA TRACE (*)    All other components within normal limits  URINE MICROSCOPIC-ADD ON - Abnormal; Notable for the following:    Squamous Epithelial / LPF 6-30 (*)    Bacteria, UA FEW (*)    All other components within normal limits  URINE CULTURE  PREGNANCY, URINE  I-STAT TROPOININ, ED    Imaging Review Dg Chest 2 View  04/08/2015  CLINICAL DATA:  Shortness of breath. History of thromboembolic disease. EXAM: CHEST  2 VIEW COMPARISON:  03/24/2015 FINDINGS: Cardiomediastinal silhouette is normal. Mediastinal contours appear intact. There is no evidence of focal airspace consolidation, pleural effusion or pneumothorax. Osseous structures are without acute abnormality. Soft tissues are grossly normal. IMPRESSION: No active cardiopulmonary disease. Electronically Signed   By: Ted Mcalpine M.D.   On: 04/08/2015 13:07   Ct Renal Stone Study  04/08/2015  CLINICAL DATA:  Right flank pain, dysuria. Patient on anticoagulation therapy. History of tubal ligation. EXAM: CT ABDOMEN AND PELVIS WITHOUT CONTRAST TECHNIQUE: Multidetector CT imaging of the abdomen and pelvis was performed following the standard protocol without IV contrast. COMPARISON:  Ultrasound of the abdomen 12/21/2014 FINDINGS: Lower chest:  No acute findings. Hepatobiliary: No mass visualized on this un-enhanced exam. Pancreas: No mass or inflammatory process identified on this un-enhanced exam. Spleen: Within normal limits in size. Adrenals/Urinary Tract: No evidence of urolithiasis or hydronephrosis. No definite mass visualized on this un-enhanced exam. Stomach/Bowel: No evidence of obstruction, inflammatory process, or abnormal fluid collections. No evidence of appendicitis. Scatter some calcified colonic  diverticular are seen. Vascular/Lymphatic: No pathologically enlarged lymph nodes. No evidence of abdominal aortic aneurysm. Mild atherosclerotic disease of the aorta. Reproductive: No mass or other significant abnormality. Fallopian tube occlusion devices in place. Other: Punctate hematomas within the anterior abdominal wall likely represent subcutaneous injection sites. No evidence of intra-abdominal or retroperitoneal hematoma. Musculoskeletal:  No suspicious bone lesions identified. IMPRESSION: No evidence of nephrolithiasis or obstructive uropathy. No acute abnormalities on this nonenhanced exam within the  abdomen or pelvis. Scattered colonic diverticulosis, including a large calcified diverticulum off of the cecum. Normal appendix. Mild atherosclerotic disease of the aorta. Electronically Signed   By: Ted Mcalpine M.D.   On: 04/08/2015 17:23   I have personally reviewed and evaluated these images and lab results as part of my medical decision-making.   EKG Interpretation   Date/Time:  Sunday April 08 2015 11:24:22 EST Ventricular Rate:  89 PR Interval:  130 QRS Duration: 90 QT Interval:  374 QTC Calculation: 455 R Axis:   93 Text Interpretation:  Normal sinus rhythm Rightward axis Borderline ECG No  significant change since last tracing Confirmed by FLOYD MD, Reuel Boom  ([id number]) on 04/08/2015 4:20:52 PM      MDM   Final diagnoses:  Leg pain  Right-sided low back pain with right-sided sciatica  UTI (lower urinary tract infection)  CVA tenderness  Patient presents for right sided back pain that radiates down her right leg with dysuria and hematuria. She states that the dysuria has been intermittent for several months and that she has a urology follow-up soon but has been worse in the last couple of days with blood in her urine. She denies any history of kidney stone. She has a history of DVT and Doppler was ordered. Doppler of right leg reveals no DVT or SVT. This could be right  sciatic pain. She has no midline lumbar vertebral tenderness on exam. No history of bowel or bladder incontinence, IV drug use, or history of cancer.  Urinalysis shows trace leukocytes but no nitrites or WBCs. Specific gravity was also high and patient appears dehydrated. Will give fluids. I will treat this as a UTI since she is having symptoms. She was given Pyridium and Keflex. Urine culture is pending. Otherwise, labs are not concerning. INR is 1.26 and below the threshold of 2-3. She states that they are currently working on this and she is taking Lovenox injections as well as her anticoagulation medication. Chest x-ray is negative for any acute cardiopulmonary disease including edema, infiltrate, or pneumothorax. EKG is normal sinus rhythm. No concerns for PE at this time. 100% oxygen on room air. She is not tachycardic. No respiratory distress and is sitting comfortably. A CT renal study was ordered because of the patient's concern for pain in the flank area.  She had some hgb in the urine.  Renal study was negative for kidney stone.  I believe this is sciatic pain with radiation down the posterior thigh.   I discussed findings with the patient as well as follow-up with PCP. Return precautions were also discussed. Patient agrees with plan.   Catha Gosselin, PA-C 04/09/15 1633  Melene Plan, DO 04/10/15 1656

## 2015-04-08 NOTE — Discharge Instructions (Signed)
Sciatica Keep your follow-up appointment with urology. Return for fever, vomiting, increased back pain. Sciatica is pain, weakness, numbness, or tingling along the path of the sciatic nerve. The nerve starts in the lower back and runs down the back of each leg. The nerve controls the muscles in the lower leg and in the back of the knee, while also providing sensation to the back of the thigh, lower leg, and the sole of your foot. Sciatica is a symptom of another medical condition. For instance, nerve damage or certain conditions, such as a herniated disk or bone spur on the spine, pinch or put pressure on the sciatic nerve. This causes the pain, weakness, or other sensations normally associated with sciatica. Generally, sciatica only affects one side of the body. CAUSES   Herniated or slipped disc.  Degenerative disk disease.  A pain disorder involving the narrow muscle in the buttocks (piriformis syndrome).  Pelvic injury or fracture.  Pregnancy.  Tumor (rare). SYMPTOMS  Symptoms can vary from mild to very severe. The symptoms usually travel from the low back to the buttocks and down the back of the leg. Symptoms can include:  Mild tingling or dull aches in the lower back, leg, or hip.  Numbness in the back of the calf or sole of the foot.  Burning sensations in the lower back, leg, or hip.  Sharp pains in the lower back, leg, or hip.  Leg weakness.  Severe back pain inhibiting movement. These symptoms may get worse with coughing, sneezing, laughing, or prolonged sitting or standing. Also, being overweight may worsen symptoms. DIAGNOSIS  Your caregiver will perform a physical exam to look for common symptoms of sciatica. He or she may ask you to do certain movements or activities that would trigger sciatic nerve pain. Other tests may be performed to find the cause of the sciatica. These may include:  Blood tests.  X-rays.  Imaging tests, such as an MRI or CT scan. TREATMENT    Treatment is directed at the cause of the sciatic pain. Sometimes, treatment is not necessary and the pain and discomfort goes away on its own. If treatment is needed, your caregiver may suggest:  Over-the-counter medicines to relieve pain.  Prescription medicines, such as anti-inflammatory medicine, muscle relaxants, or narcotics.  Applying heat or ice to the painful area.  Steroid injections to lessen pain, irritation, and inflammation around the nerve.  Reducing activity during periods of pain.  Exercising and stretching to strengthen your abdomen and improve flexibility of your spine. Your caregiver may suggest losing weight if the extra weight makes the back pain worse.  Physical therapy.  Surgery to eliminate what is pressing or pinching the nerve, such as a bone spur or part of a herniated disk. HOME CARE INSTRUCTIONS   Only take over-the-counter or prescription medicines for pain or discomfort as directed by your caregiver.  Apply ice to the affected area for 20 minutes, 3-4 times a day for the first 48-72 hours. Then try heat in the same way.  Exercise, stretch, or perform your usual activities if these do not aggravate your pain.  Attend physical therapy sessions as directed by your caregiver.  Keep all follow-up appointments as directed by your caregiver.  Do not wear high heels or shoes that do not provide proper support.  Check your mattress to see if it is too soft. A firm mattress may lessen your pain and discomfort. SEEK IMMEDIATE MEDICAL CARE IF:   You lose control of your bowel  or bladder (incontinence).  You have increasing weakness in the lower back, pelvis, buttocks, or legs.  You have redness or swelling of your back.  You have a burning sensation when you urinate.  You have pain that gets worse when you lie down or awakens you at night.  Your pain is worse than you have experienced in the past.  Your pain is lasting longer than 4 weeks.  You  are suddenly losing weight without reason. MAKE SURE YOU:  Understand these instructions.  Will watch your condition.  Will get help right away if you are not doing well or get worse.   This information is not intended to replace advice given to you by your health care provider. Make sure you discuss any questions you have with your health care provider.   Document Released: 02/25/2001 Document Revised: 11/22/2014 Document Reviewed: 07/13/2011 Elsevier Interactive Patient Education 2016 Else[employer]  Urinary Tract Infection Urinary tract infections (UTIs) can develop anywhere along your urinary tract. Your urinary tract is your body's drainage system for removing wastes and extra water. Your urinary tract includes two kidneys, two ureters, a bladder, and a urethra. Your kidneys are a pair of bean-shaped organs. Each kidney is about the size of your fist. They are located below your ribs, one on each side of your spine. CAUSES Infections are caused by microbes, which are microscopic organisms, including fungi, viruses, and bacteria. These organisms are so small that they can only be seen through a microscope. Bacteria are the microbes that most commonly cause UTIs. SYMPTOMS  Symptoms of UTIs may vary by age and gender of the patient and by the location of the infection. Symptoms in young women typically include a frequent and intense urge to urinate and a painful, burning feeling in the bladder or urethra during urination. Older women and men are more likely to be tired, shaky, and weak and have muscle aches and abdominal pain. A fever may mean the infection is in your kidneys. Other symptoms of a kidney infection include pain in your back or sides below the ribs, nausea, and vomiting. DIAGNOSIS To diagnose a UTI, your caregiver will ask you about your symptoms. Your caregiver will also ask you to provide a urine sample. The urine sample will be tested for bacteria and Goodpast[place] blood cells. Caba  blood cells are made by your body to help fight infection. TREATMENT  Typically, UTIs can be treated with medication. Because most UTIs are caused by a bacterial infection, they usually can be treated with the use of antibiotics. The choice of antibiotic and length of treatment depend on your symptoms and the type of bacteria causing your infection. HOME CARE INSTRUCTIONS  If you were prescribed antibiotics, take them exactly as your caregiver instructs you. Finish the medication even if you feel better after you have only taken some of the medication.  Drink enough water and fluids to keep your urine clear or pale yellow.  Avoid caffeine, tea, and carbonated beverages. They tend to irritate your bladder.  Empty your bladder often. Avoid holding urine for long periods of time.  Empty your bladder before and after sexual intercourse.  After a bowel movement, women should cleanse from front to back. Use each tissue only once. SEEK MEDICAL CARE IF:   You have back pain.  You develop a fever.  Your symptoms do not begin to resolve within 3 days. SEEK IMMEDIATE MEDICAL CARE IF:   You have severe back pain or lower abdominal pain.  You develop chills.  You have nausea or vomiting.  You have continued burning or discomfort with urination. MAKE SURE YOU:   Understand these instructions.  Will watch your condition.  Will get help right away if you are not doing well or get worse.   This information is not intended to replace advice given to you by your health care provider. Make sure you discuss any questions you have with your health care provider.   Document Released: 12/11/2004 Document Revised: 11/22/2014 Document Reviewed: 04/11/2011 Elsevier Interactive Patient Education [employer].

## 2015-04-09 ENCOUNTER — Ambulatory Visit (HOSPITAL_COMMUNITY): Payer: Medicaid Other

## 2015-04-09 LAB — URINE CULTURE: SPECIAL REQUESTS: NORMAL

## 2015-04-11 ENCOUNTER — Ambulatory Visit (HOSPITAL_COMMUNITY): Payer: Medicaid Other

## 2015-04-13 ENCOUNTER — Ambulatory Visit (HOSPITAL_COMMUNITY): Payer: Medicaid Other

## 2015-04-16 ENCOUNTER — Ambulatory Visit (HOSPITAL_COMMUNITY): Payer: Medicaid Other

## 2015-04-16 NOTE — Addendum Note (Signed)
Addended by: Remus Blake on: 04/16/2015 04:20 PM   Modules accepted: Orders

## 2015-04-18 ENCOUNTER — Ambulatory Visit (HOSPITAL_COMMUNITY): Payer: Medicaid Other

## 2015-04-19 ENCOUNTER — Encounter (HOSPITAL_COMMUNITY): Payer: Self-pay | Admitting: *Deleted

## 2015-04-20 ENCOUNTER — Ambulatory Visit (HOSPITAL_COMMUNITY): Payer: Medicaid Other

## 2015-04-22 ENCOUNTER — Inpatient Hospital Stay (HOSPITAL_COMMUNITY)
Admission: EM | Admit: 2015-04-22 | Discharge: 2015-04-24 | DRG: 313 | Disposition: A | Discharge status: Home / Self Care | Payer: Medicaid Other | Attending: Cardiovascular Disease | Admitting: Cardiovascular Disease

## 2015-04-22 ENCOUNTER — Emergency Department (HOSPITAL_COMMUNITY): Payer: Medicaid Other

## 2015-04-22 ENCOUNTER — Encounter (HOSPITAL_COMMUNITY): Payer: Self-pay | Admitting: *Deleted

## 2015-04-22 DIAGNOSIS — D6859 Other primary thrombophilia: Secondary | ICD-10-CM | POA: Diagnosis present

## 2015-04-22 DIAGNOSIS — Z7901 Long term (current) use of anticoagulants: Secondary | ICD-10-CM

## 2015-04-22 DIAGNOSIS — I513 Intracardiac thrombosis, not elsewhere classified: Secondary | ICD-10-CM

## 2015-04-22 DIAGNOSIS — D649 Anemia, unspecified: Secondary | ICD-10-CM | POA: Diagnosis present

## 2015-04-22 DIAGNOSIS — I69398 Other sequelae of cerebral infarction: Secondary | ICD-10-CM

## 2015-04-22 DIAGNOSIS — R413 Other amnesia: Secondary | ICD-10-CM | POA: Diagnosis present

## 2015-04-22 DIAGNOSIS — Z87891 Personal history of nicotine dependence: Secondary | ICD-10-CM

## 2015-04-22 DIAGNOSIS — F32A Depression, unspecified: Secondary | ICD-10-CM | POA: Diagnosis present

## 2015-04-22 DIAGNOSIS — I252 Old myocardial infarction: Secondary | ICD-10-CM

## 2015-04-22 DIAGNOSIS — Z955 Presence of coronary angioplasty implant and graft: Secondary | ICD-10-CM

## 2015-04-22 DIAGNOSIS — R0789 Other chest pain: Principal | ICD-10-CM | POA: Diagnosis present

## 2015-04-22 DIAGNOSIS — R079 Chest pain, unspecified: Secondary | ICD-10-CM

## 2015-04-22 DIAGNOSIS — I10 Essential (primary) hypertension: Secondary | ICD-10-CM | POA: Diagnosis present

## 2015-04-22 DIAGNOSIS — I2510 Atherosclerotic heart disease of native coronary artery without angina pectoris: Secondary | ICD-10-CM | POA: Diagnosis present

## 2015-04-22 DIAGNOSIS — Z9119 Patient's noncompliance with other medical treatment and regimen: Secondary | ICD-10-CM

## 2015-04-22 DIAGNOSIS — Z9861 Coronary angioplasty status: Secondary | ICD-10-CM

## 2015-04-22 DIAGNOSIS — F419 Anxiety disorder, unspecified: Secondary | ICD-10-CM | POA: Diagnosis present

## 2015-04-22 DIAGNOSIS — Z7902 Long term (current) use of antithrombotics/antiplatelets: Secondary | ICD-10-CM

## 2015-04-22 DIAGNOSIS — Z86718 Personal history of other venous thrombosis and embolism: Secondary | ICD-10-CM

## 2015-04-22 DIAGNOSIS — E785 Hyperlipidemia, unspecified: Secondary | ICD-10-CM | POA: Diagnosis present

## 2015-04-22 DIAGNOSIS — K219 Gastro-esophageal reflux disease without esophagitis: Secondary | ICD-10-CM | POA: Diagnosis present

## 2015-04-22 DIAGNOSIS — I160 Hypertensive urgency: Secondary | ICD-10-CM | POA: Diagnosis present

## 2015-04-22 DIAGNOSIS — F329 Major depressive disorder, single episode, unspecified: Secondary | ICD-10-CM | POA: Diagnosis present

## 2015-04-22 HISTORY — DX: Intracardiac thrombosis, not elsewhere classified: I51.3

## 2015-04-22 HISTORY — DX: Hyperlipidemia, unspecified: E78.5

## 2015-04-22 HISTORY — DX: Essential (primary) hypertension: I10

## 2015-04-22 HISTORY — DX: Patient's other noncompliance with medication regimen: Z91.14

## 2015-04-22 HISTORY — DX: Gastro-esophageal reflux disease without esophagitis: K21.9

## 2015-04-22 HISTORY — DX: Patient's other noncompliance with medication regimen for other reason: Z91.148

## 2015-04-22 LAB — BASIC METABOLIC PANEL
Anion gap: 14 (ref 5–15)
BUN: 8 mg/dL (ref 6–20)
CHLORIDE: 103 mmol/L (ref 101–111)
CO2: 21 mmol/L — ABNORMAL LOW (ref 22–32)
Calcium: 9.5 mg/dL (ref 8.9–10.3)
Creatinine, Ser: 0.67 mg/dL (ref 0.44–1.00)
GFR calc Af Amer: >60 mL/min (ref >60)
GFR calc non Af Amer: >60 mL/min (ref >60)
GLUCOSE: 116 mg/dL — AB (ref 65–99)
POTASSIUM: 3.6 mmol/L (ref 3.5–5.1)
Sodium: 138 mmol/L (ref 135–145)

## 2015-04-22 LAB — I-STAT TROPONIN, ED: Troponin i, poc: 0.29 ng/mL (ref 0.00–0.08)

## 2015-04-22 LAB — PROTIME-INR
INR: 1.09 (ref 0.00–1.49)
Prothrombin Time: 14.3 seconds (ref 11.6–15.2)

## 2015-04-22 LAB — CBC
HEMATOCRIT: 34.8 % — AB (ref 36.0–46.0)
Hemoglobin: 11.2 g/dL — ABNORMAL LOW (ref 12.0–15.0)
MCH: 28.6 pg (ref 26.0–34.0)
MCHC: 32.2 g/dL (ref 30.0–36.0)
MCV: 89 fL (ref 78.0–100.0)
Platelets: 285 10*3/uL (ref 150–400)
RBC: 3.91 MIL/uL (ref 3.87–5.11)
RDW: 20.1 % — AB (ref 11.5–15.5)
WBC: 8.9 10*3/uL (ref 4.0–10.5)

## 2015-04-22 LAB — D-DIMER, QUANTITATIVE: D-Dimer, Quant: 0.3 ug/mL-FEU (ref 0.00–0.50)

## 2015-04-22 MED ORDER — NITROGLYCERIN IN D5W 200-5 MCG/ML-% IV SOLN
5.0000 ug/min | Freq: Once | INTRAVENOUS | Status: AC
  Administered 2015-04-22: 5 ug/min via INTRAVENOUS
  Filled 2015-04-22: qty 250

## 2015-04-22 MED ORDER — ONDANSETRON HCL 4 MG/2ML IJ SOLN
4.0000 mg | Freq: Once | INTRAMUSCULAR | Status: AC
  Administered 2015-04-22: 4 mg via INTRAVENOUS
  Filled 2015-04-22: qty 2

## 2015-04-22 MED ORDER — MORPHINE SULFATE (PF) 4 MG/ML IV SOLN
4.0000 mg | Freq: Once | INTRAVENOUS | Status: AC
  Administered 2015-04-22: 4 mg via INTRAVENOUS
  Filled 2015-04-22: qty 1

## 2015-04-22 MED ORDER — ASPIRIN 81 MG PO CHEW
324.0000 mg | CHEWABLE_TABLET | Freq: Once | ORAL | Status: AC
  Administered 2015-04-22: 324 mg via ORAL
  Filled 2015-04-22: qty 4

## 2015-04-22 NOTE — ED Provider Notes (Signed)
CSN: [id number]     Arrival date & time 04/22/15  2127 History   First MD Initiated Contact with Patient 04/22/15 2222     Chief Complaint  Patient presents with  . Chest Pain     (Consider location/radiation/quality/duration/timing/severity/associated sxs/prior Treatment) HPI Comments: Patient with past medical history of stroke, CAD, and prior MI presents to the emergency department with chief complaint of chest pain. States that her symptoms started suddenly this morning. They have been constant. She states the pain is moderate. She is anticoagulated with Coumadin and Lovenox for antithrombin III deficiency.  She reports some associated shortness of breath. She denies any fevers, chills, cough, nausea, or vomiting. She denies history of PE or DVT. She states that she did have calf pain and leg pain a couple weeks ago. She states that she had a negative ultrasound, but that the pain persisted until a couple days ago when it resolved spontaneously. There are no modifying factors. She tried taking BC powder and a sublingual nitroglycerin.  Cardiologist is Dr. Jens Som  The history is provided by the patient. No language interpreter was used.    Past Medical History  Diagnosis Date  . Stroke (HCC)     assoc with short term memory loss and right peripheral vision loss; age 21   . Blood transfusion   . Anxiety   . Coronary artery disease     Apical LAD infarction '00; NSTEMI s/p BMS to prox LAD '09; Cath 12/2011 single vessel CAD w/ patent LAD stent w/ stable mild ISR, nl LV systolic fxn  . Anemia   . Antithrombin III deficiency (HCC)     ?pt not sure if true diagnosis  . Tobacco abuse   . High blood pressure   . Myocardial infarction [facility])    Past Surgical History  Procedure Laterality Date  . Tubal ligation    . Cardiac catheterization    . Esophagogastroduodenoscopy N/A 06/09/2012    Procedure: ESOPHAGOGASTRODUODENOSCOPY (EGD);  Surgeon: Theda Belfast, MD;  Location: [facility];   Service: Endoscopy;  Laterality: N/A;  . Coronary angioplasty    . Esophagogastroduodenoscopy N/A 08/02/2013    Procedure: ESOPHAGOGASTRODUODENOSCOPY (EGD);  Surgeon: Meryl Dare, MD;  Location: [facility] ENDOSCOPY;  Service: Endoscopy;  Laterality: N/A;  . Left heart catheterization with coronary angiogram N/A 12/24/2011    Procedure: LEFT HEART CATHETERIZATION WITH CORONARY ANGIOGRAM;  Surgeon: Kathleene Hazel, MD;  Location: [facility] CATH LAB;  Service: Cardiovascular;  Laterality: N/A;  . Colonoscopy N/A 03/29/2014    Procedure: COLONOSCOPY;  Surgeon: Louis Meckel, MD;  Location: [facility] ENDOSCOPY;  Service: Endoscopy;  Laterality: N/A;  . Cardiac catheterization N/A 01/29/2015    Procedure: Left Heart Cath and Coronary Angiography;  Surgeon: Peter M Swaziland, MD;  Location: [facility] INVASIVE CV LAB;  Service: Cardiovascular;  Laterality: N/A;   Family History  Problem Relation Age of Onset  . Heart disease Brother     arrhythmia; died  . Breast cancer Maternal Aunt    Social History  Substance Use Topics  . Smoking status: Former Smoker -- 0.20 packs/day for 1 years    Types: Cigarettes    Quit date: 11/26/2014  . Smokeless tobacco: Never Used     Comment: STOPPED SMOKING IN SEPTEMBER 2016  . Alcohol Use: 0.0 oz/week    0 Cans of beer per week   OB History    No data available     Review of Systems  Constitutional: Negative for fever and chills.  Respiratory: Negative for shortness of breath.   Cardiovascular: Positive for chest pain.  Gastrointestinal: Negative for nausea, vomiting, diarrhea and constipation.  Genitourinary: Negative for dysuria.  All other systems reviewed and are negative.     Allergies  Review of patient's allergies indicates no known allergies.  Home Medications   Prior to Admission medications   Medication Sig Start Date End Date Taking? Authorizing Provider  acetaminophen (TYLENOL) 500 MG tablet Take 1,000 mg by mouth every 6 (six) hours as needed for  headache (pain).    Historical Provider, MD  ALPRAZolam Prudy Feeler) 0.25 MG tablet Take 1 tablet (0.25 mg total) by mouth at bedtime as needed for anxiety or sleep. 03/28/15   Ellsworth Lennox, PA  amLODipine (NORVASC) 5 MG tablet Take 5 mg by mouth daily.    Historical Provider, MD  atorvastatin (LIPITOR) 40 MG tablet Take 1 tablet (40 mg total) by mouth daily. 02/01/15   Darreld Mclean, MD  carvedilol (COREG) 25 MG tablet Take 1 tablet (25 mg total) by mouth 2 (two) times daily with a meal. 03/28/15   Ellsworth Lennox, PA  cephALEXin (KEFLEX) 500 MG capsule Take 1 capsule (500 mg total) by mouth 2 (two) times daily. 04/08/15   Hanna Patel-Mills, PA-C  clopidogrel (PLAVIX) 75 MG tablet Take 1 tablet (75 mg total) by mouth daily. 03/28/15   Ellsworth Lennox, PA  enoxaparin (LOVENOX) 120 MG/0.8ML injection Inject 0.8 mLs (120 mg total) into the skin daily. Inject 0.9mls into the skin daily. 02/20/15   Ejiroghene Wendall Stade, MD  hydrocortisone-pramoxine (PROCTOFOAM-HC) rectal foam Place 1 applicator rectally 2 (two) times daily. Patient taking differently: Place 1 applicator rectally daily as needed for hemorrhoids.  06/15/14   Devoria Albe, MD  isosorbide mononitrate (IMDUR) 30 MG 24 hr tablet Take 1 tablet (30 mg total) by mouth daily. 03/28/15   Ellsworth Lennox, PA  nitroGLYCERIN (NITROSTAT) 0.4 MG SL tablet Place 1 tablet (0.4 mg total) under the tongue every 5 (five) minutes as needed for chest pain (up to 3 doses). 10/14/13   Beatrice Lecher, PA-C  ondansetron (ZOFRAN ODT) 4 MG disintegrating tablet Take 1 tablet (4 mg total) by mouth every 8 (eight) hours as needed for nausea or vomiting. 09/10/14   Emilia Beck, PA-C  oxyCODONE-acetaminophen (PERCOCET/ROXICET) 5-325 MG tablet Take 2 tablets by mouth every 4 (four) hours as needed for severe pain. 04/08/15   Hanna Patel-Mills, PA-C  pantoprazole (PROTONIX) 40 MG tablet Take 1 tablet (40 mg total) by mouth daily. 02/01/15   Darreld Mclean, MD   phenazopyridine (PYRIDIUM) 200 MG tablet Take 1 tablet (200 mg total) by mouth 3 (three) times daily. 04/08/15   Hanna Patel-Mills, PA-C  sertraline (ZOLOFT) 50 MG tablet Take 100 mg by mouth daily.    Historical Provider, MD  sucralfate (CARAFATE) 1 GM/10ML suspension Take 10 mLs (1 g total) by mouth 4 (four) times daily -  with meals and at bedtime. 12/22/14   Darreld Mclean, MD  warfarin (COUMADIN) 5 MG tablet Take 3 tablets (15 mg total) by mouth daily at 6 PM. 03/28/15   Ellsworth Lennox, PA   BP 149/111 mmHg  Pulse 102  Temp(Src) 98.2 F (36.8 C) (Oral)  Resp 18  Ht  (1.676 m)  Wt 71.668 kg  BMI 25.51 kg/m2  SpO2 99%  LMP 04/02/2015 Physical Exam  Constitutional: She is oriented to person, place, and time. She appears well-developed and well-nourished.  HENT:  Head: Normocephalic and atraumatic.  Eyes: Conjunctivae and EOM are normal. Pupils are equal, round, and reactive to light.  Neck: Normal range of motion. Neck supple.  Cardiovascular: Normal rate and regular rhythm.  Exam reveals no gallop and no friction rub.   No murmur heard. Pulmonary/Chest: Effort normal and breath sounds normal. No respiratory distress. She has no wheezes. She has no rales. She exhibits no tenderness.  Abdominal: Soft. Bowel sounds are normal. She exhibits no distension and no mass. There is no tenderness. There is no rebound and no guarding.  Musculoskeletal: Normal range of motion. She exhibits no edema or tenderness.  Neurological: She is alert and oriented to person, place, and time.  Skin: Skin is warm and dry.  Psychiatric: She has a normal mood and affect. Her behavior is normal. Judgment and thought content normal.  Nursing note and vitals reviewed.   ED Course  Procedures (including critical care time) Results for orders placed or performed during the hospital encounter of 04/22/15  Basic metabolic panel  Result Value Ref Range   Sodium 138 135 - 145 mmol/L   Potassium 3.6 3.5 -  5.1 mmol/L   Chloride 103 101 - 111 mmol/L   CO2 21 (L) 22 - 32 mmol/L   Glucose, Bld 116 (H) 65 - 99 mg/dL   BUN 8 6 - 20 mg/dL   Creatinine, Ser 1.61 0.44 - 1.00 mg/dL   Calcium 9.5 8.9 - 09.6 mg/dL   GFR calc non Af Amer >60 >60 mL/min   GFR calc Af Amer >60 >60 mL/min   Anion gap 14 5 - 15  CBC  Result Value Ref Range   WBC 8.9 4.0 - 10.5 K/uL   RBC 3.91 3.87 - 5.11 MIL/uL   Hemoglobin 11.2 (L) 12.0 - 15.0 g/dL   HCT 04.5 (L) 40.9 - 81.1 %   MCV 89.0 78.0 - 100.0 fL   MCH 28.6 26.0 - 34.0 pg   MCHC 32.2 30.0 - 36.0 g/dL   RDW 91.4 (H) 78.2 - 95.6 %   Platelets 285 150 - 400 K/uL  D-dimer, quantitative (not at [facility])  Result Value Ref Range   D-Dimer, Quant 0.30 0.00 - 0.50 ug/mL-FEU  I-stat troponin, ED (not at [facility])  Result Value Ref Range   Troponin i, poc 0.29 (HH) 0.00 - 0.08 ng/mL   Comment NOTIFIED PHYSICIAN    Comment 3           Dg Chest 2 View  04/22/2015  CLINICAL DATA:  Acute onset of left-sided chest and arm pain. Shortness of breath and nausea. Tachycardia. Initial encounter. EXAM: CHEST  2 VIEW COMPARISON:  Chest radiograph from 04/08/2015 FINDINGS: The lungs are well-aerated and clear. There is no evidence of focal opacification, pleural effusion or pneumothorax. The heart is normal in size; the mediastinal contour is within normal limits. No acute osseous abnormalities are seen. IMPRESSION: No acute cardiopulmonary process seen. Electronically Signed   By: Roanna Raider M.D.   On: 04/22/2015 22:47   Dg Chest 2 View  04/08/2015  CLINICAL DATA:  Shortness of breath. History of thromboembolic disease. EXAM: CHEST  2 VIEW COMPARISON:  03/24/2015 FINDINGS: Cardiomediastinal silhouette is normal. Mediastinal contours appear intact. There is no evidence of focal airspace consolidation, pleural effusion or pneumothorax. Osseous structures are without acute abnormality. Soft tissues are grossly normal. IMPRESSION: No active cardiopulmonary disease. Electronically  Signed   By: Ted Mcalpine M.D.   On: 04/08/2015 13:07   Dg Chest Port 1 View  03/24/2015  CLINICAL DATA:  Shortness of breath, chest pain and nausea. EXAM: PORTABLE CHEST 1 VIEW COMPARISON:  01/28/2015 FINDINGS: The heart size and mediastinal contours are within normal limits. Both lungs are clear. The visualized skeletal structures are unremarkable. IMPRESSION: No active disease. Electronically Signed   By: Ted Mcalpine M.D.   On: 03/24/2015 23:33   Ct Renal Stone Study  04/08/2015  CLINICAL DATA:  Right flank pain, dysuria. Patient on anticoagulation therapy. History of tubal ligation. EXAM: CT ABDOMEN AND PELVIS WITHOUT CONTRAST TECHNIQUE: Multidetector CT imaging of the abdomen and pelvis was performed following the standard protocol without IV contrast. COMPARISON:  Ultrasound of the abdomen 12/21/2014 FINDINGS: Lower chest:  No acute findings. Hepatobiliary: No mass visualized on this un-enhanced exam. Pancreas: No mass or inflammatory process identified on this un-enhanced exam. Spleen: Within normal limits in size. Adrenals/Urinary Tract: No evidence of urolithiasis or hydronephrosis. No definite mass visualized on this un-enhanced exam. Stomach/Bowel: No evidence of obstruction, inflammatory process, or abnormal fluid collections. No evidence of appendicitis. Scatter some calcified colonic diverticular are seen. Vascular/Lymphatic: No pathologically enlarged lymph nodes. No evidence of abdominal aortic aneurysm. Mild atherosclerotic disease of the aorta. Reproductive: No mass or other significant abnormality. Fallopian tube occlusion devices in place. Other: Punctate hematomas within the anterior abdominal wall likely represent subcutaneous injection sites. No evidence of intra-abdominal or retroperitoneal hematoma. Musculoskeletal:  No suspicious bone lesions identified. IMPRESSION: No evidence of nephrolithiasis or obstructive uropathy. No acute abnormalities on this nonenhanced exam  within the abdomen or pelvis. Scattered colonic diverticulosis, including a large calcified diverticulum off of the cecum. Normal appendix. Mild atherosclerotic disease of the aorta. Electronically Signed   By: Ted Mcalpine M.D.   On: 04/08/2015 17:23    I have personally reviewed and evaluated these images and lab results as part of my medical decision-making.   EKG Interpretation   Date/Time:  Sunday April 22 2015 21:32:23 EST Ventricular Rate:  103 PR Interval:  130 QRS Duration: 72 QT Interval:  346 QTC Calculation: 453 R Axis:   92 Text Interpretation:  Sinus tachycardia Rightward axis Anteroseptal  infarct , age undetermined Abnormal ECG agree. no change from old  Confirmed by Donnald Garre, MD, Lebron Conners [phone]) on 04/22/2015 10:56:27 PM      MDM   Final diagnoses:  Chest pain with high risk of acute coronary syndrome    Patient with chest pain. Onset of symptoms was this morning. Pain is moderate. She has significant history remarkable for MI, CAD, prior stroke. She is anticoagulated on Coumadin and Lovenox. Will check PT/INR. Consider PE. Check d-dimer.  D-dimer is negative, troponin is elevated at 0.29.  Patient seen by and discussed with Dr. Donnald Garre, who recommends nitro drip and consult to cardiology.  Patient had improvement of pain from an 8/10 to 5/10 with morphine.    11:19 PM Pain is trending down.  4/10 now.  Cardiology fellow to admit patient.   Chest pain, elevated troponin, NSTEMI CRITICAL CARE Performed by: Roxy Horseman   Total critical care time: 35 minutes  Critical care time was exclusive of separately billable procedures and treating other patients.  Critical care was necessary to treat or prevent imminent or life-threatening deterioration.  Critical care was time spent personally by me on the following activities: development of treatment plan with patient and/or surrogate as well as nursing, discussions with consultants, evaluation of  patient's response to treatment, examination of patient, obtaining history from patient or surrogate, ordering and performing treatments and interventions, ordering  and review of laboratory studies, ordering and review of radiographic studies, pulse oximetry and re-evaluation of patient's condition.     Roxy Horseman, PA-C 04/23/15 0000  Arby Barrette, MD 04/23/15 [phone]

## 2015-04-22 NOTE — H&P (Addendum)
History & Physical    Patient ID: DOMENIQUE QUEST MRN: [id number], DOB/AGE: May 31, 1970  Admit date: 04/22/2015 Primary Physician: Jenetta Downer, MD Primary Cardiologist: Kirk Ruths, MD  HPI    CC:  CP  45 yo M pt of Dr. Stanford Breed w a h/o CAD s/p BMS to pLAD 2009, apical mural thrombus (CMR 01/2015), TIA (2010), ATIII Deficiency, HTN, HLD, & prior tobacco use presented today at 23:31 w CP similar to prior anginal pain, which she woke up with this morning at 7:00 am.  She described it as constant, 8/10, left-sided pressure that radiated down her left arm & was associated with a single episode of emesis.  She also had dyspnea, lightheadedness, & palpitations.  She stated that the pain was worse with lying flat.  She was unsure if it worsened with physical activity.  She denied association with oral intake or deep breaths.  She took a single SL NTG prior to coming to the ED, which transiently improve but did not resolve her pain.  Her pain then nearly resolved in the ED with Morphine & a NTG gtt, but it was difficult for her to distinguish which helped more as they were given at the same time.  Her EKG was stable from prior with sinus rhythm & anterior infarct.  She had similar presentations - 03/24/15 to 03/28/15:  She had troponin elevation to 1.06.  Her pain was felt to be musculoskeletal due to being reproducible on physical exam & responsive to Tylenol.  Her Carvedilol was increased from 12.5 mg BID to 41m BID, & her Imdur from 15 mg daily to 30 mg daily. Her Ranexa was discontinued.  However, Dr. CStanford Breedre-initiated this on an outpatient basis shortly thereafter.   - 01/28/15 to 02/01/15:  She had troponin elevation with a peak value of 0.76.  Cardiac cath demonstrated a stable LAD stent.  She was started on Isosorbide mononitrate & Ranolazine.  In between these hospitalizations, she has CP ~ two times weekly, not in association with activity, oral intake, position, or deep breaths.   These generally last a couple of hours at a time & resolve with Tylenol.  She rarely takes NTG.  BP measurements at home are typically 112/90's.  Otherwise, she denied fevers, coughing, pharyngitis, myalgias, diaphoresis, lower extremity swelling, orthopnea, or paroxysmal nocturnal dyspnea.  Regarding her baseline physical activity, she is not limited in any particular way.  She is able to walk her dogs regularly, & she is busy with her 157year old child. She works from home, [employer], [employer] She is mostly adherent to her medications, though she ran out of Warfarin a couple of days prior.  She denied recent travel.  She was evaluated for RLE pain in the ED 04/08/15 when she underwent a doppler that was negative for DVT.  Her RLE pain has resolved since that time.  Past Medical History   Past Medical History  Diagnosis Date  . St[place]     assoc with short term memory loss and right peripheral vision loss; age 45  . Blood transfusion   . Anxiety   . Coronary artery disease     Apical LAD infarction '00; NSTEMI s/p BMS to prox LAD '09; Cath 12/2011 single vessel CAD w/ patent LAD stent w/ stable mild ISR, nl LV systolic fxn  . Anemia   . Antithrombin III deficiency ([place]     ?pt not sure if true diagnosis  . Tobacco abuse   .  High blood pressure   . Myocardial infarction [facility])     Past Surgical History  Procedure Laterality Date  . Tubal ligation    . Cardiac catheterization    . Esophagogastroduodenoscopy N/A 06/09/2012    Procedure: ESOPHAGOGASTRODUODENOSCOPY (EGD);  Surgeon: Beryle Beams, MD;  Location: [facility];  Service: Endoscopy;  Laterality: N/A;  . Coronary angioplasty    . Esophagogastroduodenoscopy N/A 08/02/2013    Procedure: ESOPHAGOGASTRODUODENOSCOPY (EGD);  Surgeon: Ladene Artist, MD;  Location: [facility] ENDOSCOPY;  Service: Endoscopy;  Laterality: N/A;  . Left heart catheterization with coronary angiogram N/A 12/24/2011    Procedure: LEFT HEART  CATHETERIZATION WITH CORONARY ANGIOGRAM;  Surgeon: Burnell Blanks, MD;  Location: [facility] CATH LAB;  Service: Cardiovascular;  Laterality: N/A;  . Colonoscopy N/A 03/29/2014    Procedure: COLONOSCOPY;  Surgeon: Inda Castle, MD;  Location: [facility];  Service: Endoscopy;  Laterality: N/A;  . Cardiac catheterization N/A 01/29/2015    Procedure: Left Heart Cath and Coronary Angiography;  Surgeon: Peter M Martinique, MD;  Location: [facility] CV LAB;  Service: Cardiovascular;  Laterality: N/A;    Allergies  No Known Allergies  Home Medications    Prior to Admission medications   Medication Sig Start Date End Date Taking? Authorizing Provider  acetaminophen (TYLENOL) 500 MG tablet Take 1,000 mg by mouth every 6 (six) hours as needed for headache (pain).   Yes Historical Provider, MD  ALPRAZolam (XANAX) 0.25 MG tablet Take 1 tablet (0.25 mg total) by mouth at bedtime as needed for anxiety or sleep. 03/28/15  Yes Erma Heritage, PA  amLODipine (NORVASC) 5 MG tablet Take 5 mg by mouth daily.   Yes Historical Provider, MD  atorvastatin (LIPITOR) 40 MG tablet Take 1 tablet (40 mg total) by mouth daily. 02/01/15  Yes Zada Finders, MD  carvedilol (COREG) 25 MG tablet Take 1 tablet (25 mg total) by mouth 2 (two) times daily with a meal. 03/28/15  Yes Erma Heritage, PA  clopidogrel (PLAVIX) 75 MG tablet Take 1 tablet (75 mg total) by mouth daily. 03/28/15  Yes Erma Heritage, PA  enoxaparin (LOVENOX) 120 MG/0.8ML injection Inject 0.8 mLs (120 mg total) into the skin daily. Inject 0.21ms into the skin daily. 02/20/15  Yes Ejiroghene EArlyce Dice MD  hydrocortisone-pramoxine (PROCTOFOAM-HC) rectal foam Place 1 applicator rectally 2 (two) times daily. Patient taking differently: Place 1 applicator rectally daily as needed for hemorrhoids.  06/15/14  Yes IRolland Porter MD  nitroGLYCERIN (NITROSTAT) 0.4 MG SL tablet Place 1 tablet (0.4 mg total) under the tongue every 5 (five) minutes as needed for chest  pain (up to 3 doses). 10/14/13  Yes SLiliane Shi PA-C  ondansetron (ZOFRAN ODT) 4 MG disintegrating tablet Take 1 tablet (4 mg total) by mouth every 8 (eight) hours as needed for nausea or vomiting. 09/10/14  Yes KAlvina Chou PA-C  oxyCODONE-acetaminophen (PERCOCET/ROXICET) 5-325 MG tablet Take 2 tablets by mouth every 4 (four) hours as needed for severe pain. 04/08/15  Yes Hanna Patel-Mills, PA-C  pantoprazole (PROTONIX) 40 MG tablet Take 1 tablet (40 mg total) by mouth daily. 02/01/15  Yes VZada Finders MD  ranolazine (RANEXA) 500 MG 12 hr tablet Take 500 mg by mouth 2 (two) times daily.   Yes Historical Provider, MD  sertraline (ZOLOFT) 50 MG tablet Take 100 mg by mouth daily.   Yes Historical Provider, MD  sucralfate (CARAFATE) 1 GM/10ML suspension Take 10 mLs (1 g total) by mouth 4 (four) times daily -  with meals and at bedtime. 12/22/14  Yes Zada Finders, MD  warfarin (COUMADIN) 5 MG tablet Take 3 tablets (15 mg total) by mouth daily at 6 PM. 03/28/15  Yes Erma Heritage, PA   Family History    Family History  Problem Relation Age of Onset  . Heart disease Brother     arrhythmia; died  . Breast cancer Maternal Aunt    Social History    Social History   Social History  . Marital Status: Divorced    Spouse Name: N/A  . Number of Children: 2  . Years of Education: N/A   Occupational History  . Writes grants for [employer]    Social History Main Topics  . Smoking status: Former Smoker -- 0.20 packs/day for 1 years    Types: Cigarettes    Quit date: 11/26/2014  . Smokeless tobacco: Never Used     Comment: STOPPED SMOKING IN SEPTEMBER 2016  . Alcohol Use: 0.0 oz/week    0 Cans of beer per week     Comment: Social drinker  . Drug Use: Yes    Special: Cocaine     Comment: hx of use x 1  . Sexual Activity: No   Other Topics Concern  . Not on file   Social History Narrative    Review of Systems    General:  No chills, fever, night sweats or weight  changes.  Cardiovascular:  Positive for chest pain, dyspnea, palpitations.  Negative for edema, orthopnea, palpitations, paroxysmal nocturnal dyspnea. Dermatological: No rash, lesions/masses Respiratory: No cough, dyspnea Urologic: No hematuria, dysuria Abdominal:   No nausea, vomiting, diarrhea, bright red blood per rectum, melena, or hematemesis Neurologic:  No visual changes, wkns, changes in mental status. All other systems reviewed and are otherwise negative except as noted above.  Physical Exam    Blood pressure 162/127, pulse 89, temperature 98.2 F (36.8 C), temperature source Oral, resp. rate 18, height 5' 6" (1.676 m), weight 71.668 kg (158 lb), last menstrual period 04/02/2015, SpO2 99 %.  General: Pleasant, NAD Psych: Normal affect. Neuro: Alert and oriented X 3. Moves all extremities spontaneously. HEENT: Normal  Neck: Supple without bruits or JVD. Lungs:  Resp regular and unlabored, CTA. Heart: RRR no s3, s4, or murmurs. Abdomen: Soft, non-tender, non-distended, BS + x 4.  Extremities: No clubbing, cyanosis or edema. DP/PT/Radials 2+ and equal bilaterally.  Labs    Troponin [facility] Test)  Recent Labs  04/22/15 2233  TROPIPOC 0.29*   No results for input(s): CKTOTAL, CKMB, TROPONINI in the last 72 hours. Lab Results  Component Value Date   WBC 8.9 04/22/2015   HGB 11.2* 04/22/2015   HCT 34.8* 04/22/2015   MCV 89.0 04/22/2015   PLT 285 04/22/2015    Recent Labs Lab 04/22/15 2156  NA 138  K 3.6  CL 103  CO2 21*  BUN 8  CREATININE 0.67  CALCIUM 9.5  GLUCOSE 116*   Lab Results  Component Value Date   CHOL 201* 03/25/2015   HDL 60 03/25/2015   LDLCALC 68 03/25/2015   TRIG 363* 03/25/2015   Lab Results  Component Value Date   DDIMER 0.30 04/22/2015   Radiology Studies    Dg Chest 2 View  04/22/2015  CLINICAL DATA:  Acute onset of left-sided chest and arm pain. Shortness of breath and nausea. Tachycardia. Initial encounter. EXAM: CHEST  2  VIEW COMPARISON:  Chest radiograph from 04/08/2015 FINDINGS: The lungs are well-aerated and clear. There  is no evidence of focal opacification, pleural effusion or pneumothorax. The heart is normal in size; the mediastinal contour is within normal limits. No acute osseous abnormalities are seen. IMPRESSION: No acute cardiopulmonary process seen. Electronically Signed   By: Garald Balding M.D.   On: 04/22/2015 22:47   Dg Chest 2 View  04/08/2015  CLINICAL DATA:  Shortness of breath. History of thromboembolic disease. EXAM: CHEST  2 VIEW COMPARISON:  03/24/2015 FINDINGS: Cardiomediastinal silhouette is normal. Mediastinal contours appear intact. There is no evidence of focal airspace consolidation, pleural effusion or pneumothorax. Osseous structures are without acute abnormality. Soft tissues are grossly normal. IMPRESSION: No active cardiopulmonary disease. Electronically Signed   By: Fidela Salisbury M.D.   On: 04/08/2015 13:07   Dg Chest Port 1 View  03/24/2015  CLINICAL DATA:  Shortness of breath, chest pain and nausea. EXAM: PORTABLE CHEST 1 VIEW COMPARISON:  01/28/2015 FINDINGS: The heart size and mediastinal contours are within normal limits. Both lungs are clear. The visualized skeletal structures are unremarkable. IMPRESSION: No active disease. Electronically Signed   By: Fidela Salisbury M.D.   On: 03/24/2015 23:33   Ct Renal Stone Study  04/08/2015  CLINICAL DATA:  Right flank pain, dysuria. Patient on anticoagulation therapy. History of tubal ligation. EXAM: CT ABDOMEN AND PELVIS WITHOUT CONTRAST TECHNIQUE: Multidetector CT imaging of the abdomen and pelvis was performed following the standard protocol without IV contrast. COMPARISON:  Ultrasound of the abdomen 12/21/2014 FINDINGS: Lower chest:  No acute findings. Hepatobiliary: No mass visualized on this un-enhanced exam. Pancreas: No mass or inflammatory process identified on this un-enhanced exam. Spleen: Within normal limits in size.  Adrenals/Urinary Tract: No evidence of urolithiasis or hydronephrosis. No definite mass visualized on this un-enhanced exam. Stomach/Bowel: No evidence of obstruction, inflammatory process, or abnormal fluid collections. No evidence of appendicitis. Scatter some calcified colonic diverticular are seen. Vascular/Lymphatic: No pathologically enlarged lymph nodes. No evidence of abdominal aortic aneurysm. Mild atherosclerotic disease of the aorta. Reproductive: No mass or other significant abnormality. Fallopian tube occlusion devices in place. Other: Punctate hematomas within the anterior abdominal wall likely represent subcutaneous injection sites. No evidence of intra-abdominal or retroperitoneal hematoma. Musculoskeletal:  No suspicious bone lesions identified. IMPRESSION: No evidence of nephrolithiasis or obstructive uropathy. No acute abnormalities on this nonenhanced exam within the abdomen or pelvis. Scattered colonic diverticulosis, including a large calcified diverticulum off of the cecum. Normal appendix. Mild atherosclerotic disease of the aorta. Electronically Signed   By: Fidela Salisbury M.D.   On: 04/08/2015 17:23   Assessment & Plan    A/P:  45 yo M pt of Dr. Stanford Breed w a h/o CAD s/p BMS to pLAD 2009, apical mural thrombus (CMR 01/2015), TIA (2010), ATIII Deficiency, HTN, HLD, & prior tobacco use presented today with atypical CP.  # Chest pain w a h/o CAD - Her pain is atypical in nature.  Her EKG is stable from prior.  She has borderline troponin elevation decreased from her prior admission but positive since 04/08/15.  Differentials include hypertensive urgency with BP as high as 162/127 in the ED; pericarditis (worse with laying back); or ischemia.  PE is less likely given her negative Ddimer.  Her chest wall is not tender to palpation. - Monitor on telemetry with serial cardiac enzymes & NTG gtt to address her elevated blood pressures. - Have also checked a urine drug screen as she has  previously had cocaine in her urine reported to be a single use situation).   -  Check a ESR & CRP for evidence of pericarditis.  If CP continues despite BP control (& if troponin not accelerating), could consider empiric colchicine. - Based on trend of troponin & symptoms, will consider whether further ischemic workup (including echo as less than 1 month prior) is warranted. - She is not currently on ASA with her Clopidogrel & Warfarin combination.  Will resume while ACS is being ruled out.  She was given a single dose 325 while in the ED. - Regarding her chronic medications, will continue her Carvedilol 25 mg PO BID (so long as UDS negative for cocaine), Atorvastatin, & Clopidogrel.  Will hold her Ranolazine while she is receiving the NTG gtt.    # h/o ATIII Deficiency & apical mural thrombus - Her INR is sub-therapeutic today, having gone without it in the preceding couple of days. - Continue her therapeutic Enoxaparin (holding Warfarin) for now while the possibility of ischemic evaluation is being considered.  # h/o HTN - Elevated as per above. - Initiating NTG gtt. - Continuing Carvedilol (so long as UDS negative for cocaine) & Amlodipine.    # h/o HLD - Per lipid profile 03/25/15, TC 201, TG 363, HDL 60, LDL 68.  - Will repeat a lipid profile tomorrow in the fasting state. - With her CAD, she should be receiving high dose statin therapy.  We should consider transitioning this admission from her Atorvastatin 40 to 80.    # h/o Anemia - Stable from prior.   - Continue to monitor.  # h/o Anxiety/Depression - Adequately controlled with current Sertraline.    # PPX - Enoxaparin  # Full code  Signed, Alfonso Ramus, MD 04/23/2015, 1:36 AM

## 2015-04-22 NOTE — ED Notes (Signed)
Pt c/o left sided chest pain that radiates to left arm. States she took 1 NTG about 5 mins ago. Pain level before NTG was 8/10. Pain level now 7/10.

## 2015-04-23 ENCOUNTER — Encounter (HOSPITAL_COMMUNITY): Payer: Self-pay | Admitting: Internal Medicine

## 2015-04-23 ENCOUNTER — Ambulatory Visit (HOSPITAL_COMMUNITY): Payer: Medicaid Other

## 2015-04-23 DIAGNOSIS — I2510 Atherosclerotic heart disease of native coronary artery without angina pectoris: Secondary | ICD-10-CM | POA: Diagnosis present

## 2015-04-23 DIAGNOSIS — I160 Hypertensive urgency: Secondary | ICD-10-CM | POA: Diagnosis present

## 2015-04-23 DIAGNOSIS — Z7902 Long term (current) use of antithrombotics/antiplatelets: Secondary | ICD-10-CM | POA: Diagnosis not present

## 2015-04-23 DIAGNOSIS — I10 Essential (primary) hypertension: Secondary | ICD-10-CM | POA: Diagnosis present

## 2015-04-23 DIAGNOSIS — F329 Major depressive disorder, single episode, unspecified: Secondary | ICD-10-CM

## 2015-04-23 DIAGNOSIS — E785 Hyperlipidemia, unspecified: Secondary | ICD-10-CM | POA: Diagnosis not present

## 2015-04-23 DIAGNOSIS — I252 Old myocardial infarction: Secondary | ICD-10-CM | POA: Diagnosis not present

## 2015-04-23 DIAGNOSIS — F419 Anxiety disorder, unspecified: Secondary | ICD-10-CM | POA: Diagnosis present

## 2015-04-23 DIAGNOSIS — R413 Other amnesia: Secondary | ICD-10-CM | POA: Diagnosis present

## 2015-04-23 DIAGNOSIS — Z7901 Long term (current) use of anticoagulants: Secondary | ICD-10-CM | POA: Diagnosis not present

## 2015-04-23 DIAGNOSIS — I69398 Other sequelae of cerebral infarction: Secondary | ICD-10-CM | POA: Diagnosis not present

## 2015-04-23 DIAGNOSIS — Z87891 Personal history of nicotine dependence: Secondary | ICD-10-CM | POA: Diagnosis not present

## 2015-04-23 DIAGNOSIS — R0789 Other chest pain: Secondary | ICD-10-CM | POA: Diagnosis present

## 2015-04-23 DIAGNOSIS — K219 Gastro-esophageal reflux disease without esophagitis: Secondary | ICD-10-CM | POA: Diagnosis present

## 2015-04-23 DIAGNOSIS — D6859 Other primary thrombophilia: Secondary | ICD-10-CM

## 2015-04-23 DIAGNOSIS — R079 Chest pain, unspecified: Secondary | ICD-10-CM | POA: Diagnosis not present

## 2015-04-23 DIAGNOSIS — Z86718 Personal history of other venous thrombosis and embolism: Secondary | ICD-10-CM | POA: Diagnosis not present

## 2015-04-23 DIAGNOSIS — Z955 Presence of coronary angioplasty implant and graft: Secondary | ICD-10-CM | POA: Diagnosis not present

## 2015-04-23 DIAGNOSIS — D649 Anemia, unspecified: Secondary | ICD-10-CM | POA: Diagnosis present

## 2015-04-23 DIAGNOSIS — Z9119 Patient's noncompliance with other medical treatment and regimen: Secondary | ICD-10-CM | POA: Diagnosis not present

## 2015-04-23 LAB — RAPID URINE DRUG SCREEN, HOSP PERFORMED
Amphetamines: NOT DETECTED
Barbiturates: NOT DETECTED
Benzodiazepines: NOT DETECTED
COCAINE: NOT DETECTED
OPIATES: POSITIVE — AB
Tetrahydrocannabinol: NOT DETECTED

## 2015-04-23 LAB — PROTIME-INR
INR: 1.24 (ref 0.00–1.49)
Prothrombin Time: 15.8 seconds — ABNORMAL HIGH (ref 11.6–15.2)

## 2015-04-23 LAB — TROPONIN I
TROPONIN I: 0.81 ng/mL — AB (ref <0.031)
TROPONIN I: 0.81 ng/mL — AB (ref <0.031)

## 2015-04-23 LAB — SEDIMENTATION RATE: Sed Rate: 15 mm/hr (ref 0–22)

## 2015-04-23 LAB — C-REACTIVE PROTEIN: CRP: <0.5 mg/dL (ref <1.0)

## 2015-04-23 LAB — LIPID PANEL
CHOL/HDL RATIO: 3.6 ratio
CHOLESTEROL: 197 mg/dL (ref 0–200)
HDL: 55 mg/dL (ref >40)
LDL Cholesterol: 72 mg/dL (ref 0–99)
TRIGLYCERIDES: 351 mg/dL — AB (ref <150)
VLDL: 70 mg/dL — AB (ref 0–40)

## 2015-04-23 LAB — TSH: TSH: 2.842 u[IU]/mL (ref 0.350–4.500)

## 2015-04-23 MED ORDER — ALPRAZOLAM 0.25 MG PO TABS
0.2500 mg | ORAL_TABLET | Freq: Every evening | ORAL | Status: DC | PRN
  Administered 2015-04-24: 0.25 mg via ORAL
  Filled 2015-04-23: qty 1

## 2015-04-23 MED ORDER — ONDANSETRON HCL 4 MG PO TABS: 4.0000 mg | ORAL_TABLET | Freq: Four times a day (QID) | ORAL | Status: DC | PRN

## 2015-04-23 MED ORDER — ATORVASTATIN CALCIUM 40 MG PO TABS
40.0000 mg | ORAL_TABLET | Freq: Every day | ORAL | Status: DC
  Administered 2015-04-23 – 2015-04-24 (×2): 40 mg via ORAL
  Filled 2015-04-23 (×2): qty 1

## 2015-04-23 MED ORDER — ONDANSETRON HCL 4 MG/2ML IJ SOLN
4.0000 mg | Freq: Four times a day (QID) | INTRAMUSCULAR | Status: DC | PRN
  Administered 2015-04-23 – 2015-04-24 (×3): 4 mg via INTRAVENOUS
  Filled 2015-04-23 (×3): qty 2

## 2015-04-23 MED ORDER — CLOPIDOGREL BISULFATE 75 MG PO TABS
75.0000 mg | ORAL_TABLET | Freq: Every day | ORAL | Status: DC
  Administered 2015-04-23 – 2015-04-24 (×2): 75 mg via ORAL
  Filled 2015-04-23 (×2): qty 1

## 2015-04-23 MED ORDER — PANTOPRAZOLE SODIUM 40 MG PO TBEC
40.0000 mg | DELAYED_RELEASE_TABLET | Freq: Every day | ORAL | Status: DC
  Administered 2015-04-23 – 2015-04-24 (×2): 40 mg via ORAL
  Filled 2015-04-23 (×2): qty 1

## 2015-04-23 MED ORDER — WARFARIN - PHARMACIST DOSING INPATIENT: Freq: Every day | Status: DC

## 2015-04-23 MED ORDER — ENOXAPARIN SODIUM 100 MG/ML ~~LOC~~ SOLN
120.0000 mg | Freq: Every day | SUBCUTANEOUS | Status: DC
Start: 2015-04-23 — End: 2015-04-23
  Administered 2015-04-23: 120 mg via SUBCUTANEOUS
  Filled 2015-04-23 (×2): qty 2

## 2015-04-23 MED ORDER — ENOXAPARIN SODIUM 100 MG/ML ~~LOC~~ SOLN
110.0000 mg | Freq: Every day | SUBCUTANEOUS | Status: DC
  Administered 2015-04-23: 110 mg via SUBCUTANEOUS
  Filled 2015-04-23 (×2): qty 2

## 2015-04-23 MED ORDER — WARFARIN SODIUM 10 MG PO TABS
20.0000 mg | ORAL_TABLET | Freq: Once | ORAL | Status: AC
  Administered 2015-04-23: 20 mg via ORAL
  Filled 2015-04-23 (×2): qty 2

## 2015-04-23 MED ORDER — CARVEDILOL 25 MG PO TABS
25.0000 mg | ORAL_TABLET | Freq: Two times a day (BID) | ORAL | Status: DC
  Administered 2015-04-23 – 2015-04-24 (×3): 25 mg via ORAL
  Filled 2015-04-23 (×3): qty 1

## 2015-04-23 MED ORDER — ALUM & MAG HYDROXIDE-SIMETH 200-200-20 MG/5ML PO SUSP: 30.0000 mL | Freq: Four times a day (QID) | ORAL | Status: DC | PRN

## 2015-04-23 MED ORDER — MORPHINE SULFATE (PF) 4 MG/ML IV SOLN
4.0000 mg | INTRAVENOUS | Status: DC | PRN
  Administered 2015-04-23 – 2015-04-24 (×3): 4 mg via INTRAVENOUS
  Filled 2015-04-23 (×3): qty 1

## 2015-04-23 MED ORDER — NITROGLYCERIN IN D5W 200-5 MCG/ML-% IV SOLN
0.0000 ug/min | INTRAVENOUS | Status: DC
  Administered 2015-04-23: 20 ug/min via INTRAVENOUS

## 2015-04-23 MED ORDER — MORPHINE SULFATE (PF) 4 MG/ML IV SOLN
4.0000 mg | Freq: Once | INTRAVENOUS | Status: AC
  Administered 2015-04-23: 4 mg via INTRAVENOUS
  Filled 2015-04-23: qty 1

## 2015-04-23 MED ORDER — RANOLAZINE ER 500 MG PO TB12
500.0000 mg | ORAL_TABLET | Freq: Two times a day (BID) | ORAL | Status: DC
  Administered 2015-04-23: 500 mg via ORAL
  Filled 2015-04-23 (×3): qty 1

## 2015-04-23 MED ORDER — ASPIRIN EC 81 MG PO TBEC: 81.0000 mg | DELAYED_RELEASE_TABLET | Freq: Every day | ORAL | Status: DC

## 2015-04-23 MED ORDER — AMLODIPINE BESYLATE 5 MG PO TABS
5.0000 mg | ORAL_TABLET | Freq: Every day | ORAL | Status: DC
  Administered 2015-04-23 – 2015-04-24 (×2): 5 mg via ORAL
  Filled 2015-04-23 (×2): qty 1

## 2015-04-23 MED ORDER — SERTRALINE HCL 100 MG PO TABS
100.0000 mg | ORAL_TABLET | Freq: Every day | ORAL | Status: DC
  Administered 2015-04-23 – 2015-04-24 (×2): 100 mg via ORAL
  Filled 2015-04-23 (×2): qty 1

## 2015-04-23 NOTE — Progress Notes (Signed)
ANTICOAGULATION CONSULT NOTE - Initial Consult  Pharmacy Consult for Coumadin  Indication: hx of ATIII deficiency and apical mural thrombus  No Known Allergies  Patient Measurements: Height: 5\' 6"  (167.6 cm) Weight: 158 lb (71.668 kg) IBW/kg (Calculated) : 59.3  Vital Signs: BP: 131/93 mmHg (02/06 0904) Pulse Rate: 102 (02/06 0904)  Labs:  Recent Labs  04/22/15 2156 04/22/15 2215 04/23/15 0630  HGB 11.2*  --   --   HCT 34.8*  --   --   PLT 285  --   --   LABPROT  --  14.3 15.8*  INR  --  1.09 1.24  CREATININE 0.67  --   --   TROPONINI  --   --  0.76*    Estimated Creatinine Clearance: 89.2 mL/min (by C-G formula based on Cr of 0.67).   Medical History: Past Medical History  Diagnosis Date  . Stroke ([facility])     assoc with short term memory loss and right peripheral vision loss; age 82   . Blood transfusion   . Anxiety   . Coronary artery disease     Apical LAD infarction '00; NSTEMI s/p BMS to prox LAD '09; Cath 12/2011 single vessel CAD w/ patent LAD stent w/ stable mild ISR, nl LV systolic fxn  . Anemia   . Antithrombin III deficiency ([facility])     ?pt not sure if true diagnosis  . Tobacco abuse   . High blood pressure   . Myocardial infarction [facility])      Assessment: 44 YOF admitted 04/22/2015 with chest pain. Pt on warfarin and lovenox PTA for hx of ATIII disorder and mural thrombus. Pharmacy consulted to dose coumadin inpatient.   PTA warfarin dose: 15mg  daily (last dose 2/3) INR on admit 1.09. INR today 1.24 PTA Lovenox dose 120mg  daily   Hgb 11.2, PLT 285, no bleeding noted   Goal of Therapy:  INR 2-3 Monitor platelets by anticoagulation protocol: Yes   Plan:  Continue lovenox 110mg  daily  Coumadin 20mg  x 1 tonight  Daily INR/CBC Monitor for s/s of bleeding   Adelbert Gaspard C. Marvis Moeller, PharmD Pharmacy Resident  Pager: [phone] 04/23/2015 11:18 AM

## 2015-04-23 NOTE — ED Notes (Signed)
This RN entered pt room to transport to [facility]. Pt reports CP has returned and requesting pain meds. This RN informed no PRN pain meds ordered but increased nitro drip to 68mcg/min to attempt to decrease CP.

## 2015-04-23 NOTE — Care Management Note (Signed)
Case Management Note  Patient Details  Name: MARIADELCARMEN CALIVA MRN: [id number] Date of Birth: 09-13-1970  Subjective/Objective:      Adm w htn urgency              Action/Plan: lives w fam, pcp dr Mariea Clonts   Expected Discharge Date:                  Expected Discharge Plan:     In-House Referral:     Discharge planning Services     Post Acute Care Choice:    Choice offered to:     DME Arranged:    DME Agency:     HH Arranged:    HH Agency:     Status of Service:     Medicare Important Message Given:    Date Medicare IM Given:    Medicare IM give by:    Date Additional Medicare IM Given:    Additional Medicare Important Message give by:     If discussed at Long Length of Stay Meetings, dates discussed:    Additional Comments: ur review done  Hanley Hays, RN 04/23/2015, 9:56 AM

## 2015-04-23 NOTE — Progress Notes (Signed)
    Subjective:  Patient complains of continuous CP since yesterday AM; no dyspnea.   Objective:  Filed Vitals:   04/23/15 0715 04/23/15 0815 04/23/15 0845 04/23/15 0904  BP: 140/96 147/98 138/100 131/93  Pulse: 79 79 75 102  Temp:      TempSrc:      Resp: 16 14 14 16   Height:      Weight:      SpO2: 97% 97% 97% 98%    Intake/Output from previous day:  Intake/Output Summary (Last 24 hours) at 04/23/15 1002 Last data filed at 04/23/15 0900  Gross per 24 hour  Intake  37.75 ml  Output      0 ml  Net  37.75 ml    Physical Exam: Physical exam: Well-developed well-nourished in no acute distress.  Skin is warm and dry.  HEENT is normal.  Neck is supple.  Chest is clear to auscultation with normal expansion.  Cardiovascular exam is regular rate and rhythm.  Abdominal exam nontender or distended. No masses palpated. Extremities show no edema. neuro grossly intact    Lab Results: Basic Metabolic Panel:  Recent Labs  50/93/26 2156  NA 138  K 3.6  CL 103  CO2 21*  GLUCOSE 116*  BUN 8  CREATININE 0.67  CALCIUM 9.5   CBC:  Recent Labs  04/22/15 2156  WBC 8.9  HGB 11.2*  HCT 34.8*  MCV 89.0  PLT 285   Cardiac Enzymes:  Recent Labs  04/23/15 0630  TROPONINI 0.76*     Assessment/Plan:  45 yo female admitted with CP; s/p apical infarct in 2000 and NSTEMI in 2009 tx with BMS to LAD; also with prior CVA, ? Anti-thrombin III deficiency, HTN, HL. Cardiac catheterization November 2016 showed no obstructive coronary disease. There was mild LV dysfunction with akinesis of the apex and note of an apical thrombus. Echocardiogram November 2016 showed normal LV function with akinesis of the apex. There was grade 2 diastolic dysfunction. There was note of an apical thrombus. Cardiac MRI November 2016 showed ejection fraction 50% with apical akinesis. There was an apical thrombus noted. Mild mitral regurgitation. Admitted now with recurrent CP.  1 chest  pain-etiology of symptoms unclear. She has had continuous chest pain since yesterday AM. Her symptoms worsened with lying flat. Her electrocardiogram shows no ST changes. Her troponin is minimally elevated but has been noted previously. Presentation is similar to that in November 2016. At that time cardiac catheterization revealed no obstructive coronary disease. I do not think she needs repeat catheterization. Recent echo did not show a pericardial effusion. 2 Apical mural thrombus-Continue Coumadin. Continue Lovenox. 3 anti-thrombin 3 deficiency-Continue Coumadin. 4 coronary artery disease-continue Plavix. Discontinue aspirin. Continue statin. 5 hypertension-continue present medications. Follow BP and adjust regimen as needed.  Lindsey [name] 04/23/2015, 10:02 AM

## 2015-04-23 NOTE — ED Notes (Signed)
Attempted report x1. 

## 2015-04-24 ENCOUNTER — Encounter (HOSPITAL_COMMUNITY): Payer: Self-pay | Admitting: Physician Assistant

## 2015-04-24 DIAGNOSIS — K219 Gastro-esophageal reflux disease without esophagitis: Secondary | ICD-10-CM

## 2015-04-24 LAB — CBC
HEMATOCRIT: 32.2 % — AB (ref 36.0–46.0)
HEMOGLOBIN: 10 g/dL — AB (ref 12.0–15.0)
MCH: 27.9 pg (ref 26.0–34.0)
MCHC: 31.1 g/dL (ref 30.0–36.0)
MCV: 89.9 fL (ref 78.0–100.0)
Platelets: 238 10*3/uL (ref 150–400)
RBC: 3.58 MIL/uL — AB (ref 3.87–5.11)
RDW: 19.5 % — ABNORMAL HIGH (ref 11.5–15.5)
WBC: 5.8 10*3/uL (ref 4.0–10.5)

## 2015-04-24 LAB — PROTIME-INR
INR: 1.14 (ref 0.00–1.49)
Prothrombin Time: 14.8 seconds (ref 11.6–15.2)

## 2015-04-24 LAB — HEMOGLOBIN A1C
Hgb A1c MFr Bld: 5.3 % (ref 4.8–5.6)
Mean Plasma Glucose: 105 mg/dL

## 2015-04-24 MED ORDER — ENOXAPARIN SODIUM 120 MG/0.8ML ~~LOC~~ SOLN: 110.0000 mg | Freq: Every day | SUBCUTANEOUS | Status: DC

## 2015-04-24 MED ORDER — WARFARIN SODIUM 10 MG PO TABS: 20.0000 mg | ORAL_TABLET | Freq: Once | ORAL | Status: DC

## 2015-04-24 NOTE — Care Management Note (Signed)
Case Management Note  Patient Details  Name: Lindsey [name] MRN: [id number] Date of Birth: [birth year]  Subjective/Objective:       Adm w htn urgency             Action/Plan: lives w fam, pcp dr Mariea Clonts   Expected Discharge Date:                  Expected Discharge Plan:  Home/Self Care  In-House Referral:     Discharge planning Services  CM Consult  Post Acute Care Choice:    Choice offered to:     DME Arranged:    DME Agency:     HH Arranged:    HH Agency:     Status of Service:  Completed, signed off  Medicare Important Message Given:    Date Medicare IM Given:    Medicare IM give by:    Date Additional Medicare IM Given:    Additional Medicare Important Message give by:     If discussed at Long Length of Stay Meetings, dates discussed:    Additional Comments: pt has medicaid for meds. Her prescriptions are 1.00 to 3.00 per prescription dep on medication.  Hanley Hays, RN 04/24/2015, 11:21 AM

## 2015-04-24 NOTE — Progress Notes (Signed)
Subjective:  Patient states her CP is better now. It's 3/10, was 7/10 when she presented yesterday. It's mainly mid sternal with some radiation to left and arm, feels like a burning pain. States her GERD symptoms has improved with protonix and able to swallow pills now. Has not been compliant with her coumadin. We discussed importance of medical compliance.   Objective:  Filed Vitals:   04/24/15 0300 04/24/15 0400 04/24/15 0500 04/24/15 0600  BP: 111/82 89/56 105/73 103/80  Pulse: 61 67 65 60  Temp:  98.4 F (36.9 C)    TempSrc:  Oral    Resp: Height:      Weight:      SpO2: 100% 100% 100% 99%    Intake/Output from previous day:  Intake/Output Summary (Last 24 hours) at 04/24/15 0811 Last data filed at 04/23/15 2000  Gross per 24 hour  Intake 997.75 ml  Output    450 ml  Net 547.75 ml    Physical Exam: Physical exam: Well-developed well-nourished in no acute distress.  Skin is warm and dry.  HEENT is normal.  Neck is supple.  Chest is clear to auscultation with normal expansion.  Cardiovascular exam is regular rate and rhythm.  Abdominal exam nontender or distended. No masses palpated. Extremities show no edema. neuro grossly intact    Lab Results: Basic Metabolic Panel:  Recent Labs  16/10/96 2156  NA 138  K 3.6  CL 103  CO2 21*  GLUCOSE 116*  BUN 8  CREATININE 0.67  CALCIUM 9.5   CBC:  Recent Labs  04/22/15 2156 04/24/15 0220  WBC 8.9 5.8  HGB 11.2* 10.0*  HCT 34.8* 32.2*  MCV 89.0 89.9  PLT 285 238   Cardiac Enzymes:  Recent Labs  04/23/15 0630 04/23/15 1119 04/23/15 1756  TROPONINI 0.76* 0.81* 0.81*     Assessment/Plan:  45 yo female admitted with CP; s/p apical infarct in 2000 and NSTEMI in 2009 tx with BMS to LAD; also with prior CVA, ? Anti-thrombin III deficiency, HTN, HL. Cardiac catheterization November 2016 showed no obstructive coronary disease. There was mild LV dysfunction with akinesis of the apex and  note of an apical thrombus. Echocardiogram November 2016 showed normal LV function with akinesis of the apex. There was grade 2 diastolic dysfunction. There was note of an apical thrombus. Cardiac MRI November 2016 showed ejection fraction 50% with apical akinesis. There was an apical thrombus noted. Mild mitral regurgitation. Admitted now with recurrent CP.  1 chest pain-etiology of symptoms unclear. Pain worse with lying flat, similar to prior encounter. ECHO that time on 1/17 did not show any pericardial effusion. Her pain did seem to correlate with her drinking beer the night prior, ? Worsening GERD. Patient is on protonix already. Was on nitro gtt and pain did improve with that. Since she recently had cardiac cath 3 months ago which showed no obstruction, we will not pursue that this time as her pain is very similar to prior episodes. With her ?anti-thrombin III def and apical thrombus, she is at risk of embolization and we have discussed the importance of coumadin compliance.  - continue PRN nitro on discharge along with her GERD meds.   2 Apical mural thrombus-Continue Coumadin. Continue Lovenox. F/up at coumadin clinic at [facility]  3 anti-thrombin 3 deficiency-Continue Coumadin.  4 coronary artery disease-has stent in LAD - continue Plavix. Discontinue aspirin. Continue statin.  5 hypertension- BP well controlled in the hospital with coreg  25mg  BID + norvasc 5 mg. Continue this for now and adjust at needed.   6. GERD - continue protonix + carafate as needed.   Ahmed, Tasrif 04/24/2015, 8:11 AM As above, patient seen and examined. Her chest pain persists but is improved. She denies dyspnea. Her troponin is minimally elevated with no clear trend. Her symptoms are not consistent with an acute coronary syndrome. She has had a recent CTA, cardiac MRI, echocardiogram and catheterization. We will not pursue further cardiac workup. She can be discharged today. Would continue Lovenox at discharge until INR  therapeutic. Check INR in our Coumadin clinic on Thursday. She needs follow-up with her primary care for further workup of noncardiac chest pain. Plan discharge today. Follow-up with PA in approximately 4 weeks. > 30 min PA and physician time D2 Olga Millers

## 2015-04-24 NOTE — Progress Notes (Signed)
ANTICOAGULATION CONSULT NOTE - Initial Consult  Pharmacy Consult for Coumadin  Indication: hx of ATIII deficiency and apical mural thrombus  No Known Allergies  Patient Measurements: Height: 5\' 6"  (167.6 cm) Weight: 158 lb (71.668 kg) IBW/kg (Calculated) : 59.3  Vital Signs: Temp: 98.1 F (36.7 C) (02/07 0800) Temp Source: Oral (02/07 0800) BP: 92/60 mmHg (02/07 0900) Pulse Rate: 62 (02/07 0900)  Labs:  Recent Labs  04/22/15 2156 04/22/15 2215 04/23/15 0630 04/23/15 1119 04/23/15 1756 04/24/15 0220  HGB 11.2*  --   --   --   --  10.0*  HCT 34.8*  --   --   --   --  32.2*  PLT 285  --   --   --   --  238  LABPROT  --  14.3 15.8*  --   --  14.8  INR  --  1.09 1.24  --   --  1.14  CREATININE 0.67  --   --   --   --   --   TROPONINI  --   --  0.76* 0.81* 0.81*  --     Estimated Creatinine Clearance: 89.2 mL/min (by C-G formula based on Cr of 0.67).   Medical History: Past Medical History  Diagnosis Date  . Stroke (HCC)     assoc with short term memory loss and right peripheral vision loss; age 45   . Blood transfusion   . Anxiety   . Coronary artery disease     Apical LAD infarction '00; NSTEMI s/p BMS to prox LAD '09; Cath 12/2011 single vessel CAD w/ patent LAD stent w/ stable mild ISR, nl LV systolic fxn  . Anemia   . Antithrombin III deficiency (HCC)     ?pt not sure if true diagnosis  . Tobacco abuse   . High blood pressure   . Myocardial infarction [facility])      Assessment: 45 YOF admitted 04/22/2015 with chest pain. Pt on warfarin and lovenox PTA for hx of ATIII disorder and mural thrombus. Pharmacy consulted to dose coumadin inpatient.   PTA warfarin dose: 15mg  daily (last dose 2/3) INR on admit 1.09.  PTA Lovenox dose 120mg  daily   INR today 1.14, H/H stable, no bleeding noted   Goal of Therapy:  INR 2-3 Monitor platelets by anticoagulation protocol: Yes   Plan:  Continue lovenox 110mg  daily  Coumadin 20mg  x 1 tonight  Daily INR/CBC Monitor  for s/s of bleeding   Jaramie Bastos C. Marvis Moeller, PharmD Pharmacy Resident  Pager: [phone] 04/24/2015 9:47 AM

## 2015-04-24 NOTE — Discharge Summary (Signed)
Discharge Summary    Patient ID: Lindsey [name],  MRN: [id number], DOB/AGE: January 18, 1971 45 y.o.  Admit date: 04/22/2015 Discharge date: 04/24/2015  Primary Care Provider: Jenetta Downer Primary Cardiologist: Dr. Stanford Breed  Discharge Diagnoses    Principal Problem:   Chest pain, unspecified Active Problems:   Hyperlipemia   Essential hypertension   CAD S/P  BMS to proximal LAD 2009-patent 01/29/15   Antithrombin III deficiency (HCC)   Apical mural thrombus (HCC)   Depression   Hypertensive urgency   GERD (gastroesophageal reflux disease)   Allergies No Known Allergies  Diagnostic Studies/Procedures    N/A _____________   History of Present Illness / Hospital Course    Lindsey [name] is a 45 y/o F with history of CAD (apical infarct 2000, NSTEMI 2009 s/p BMS to pLAD), apical mural thrombus (CMR 01/2015), TIA (2010), possible ATIII Deficiency, HTN, HLD, & prior tobacco use who presented to [facility] with chest pain on 04/22/15. She presented with symptoms simlar to prior anginal pain described as constant, 8/10, left-sided pressure that radiated down her left arm & was associated with a single episode of emesis. She also had dyspnea, lightheadedness, & palpitations. Pain was worse with lying flat, not sure if worse with physical activity. She took a single SL NTG prior to coming to the ED, which transiently improve but did not resolve her pain. Her pain then nearly resolved in the ED with Morphine & a NTG gtt, but it was difficult for her to distinguish which helped more as they were given at the same time. Her EKG was stable from prior with sinus rhythm.   Of note she had had similar presentations recently: - 01/28/15 to 02/01/15: She had troponin elevation with a peak value of 0.76. Cardiac cath demonstrated a stable LAD stent, otherwise minimal nonobstructive CAD. She was started on Isosorbide mononitrate & Ranolazine. - 03/24/15 to 03/28/15: She had troponin  elevation to 1.06. Her pain was felt to be musculoskeletal due to being reproducible on physical exam & responsive to Tylenol. Her Carvedilol was increased from 12.5 mg BID to 87m BID, & her Imdur from 15 mg daily to 30 mg daily. Her Ranexa was discontinued. However, Dr. CStanford Breedre-initiated this on an outpatient basis shortly thereafter.2D Echo 03/26/15: EF 50% with apical akinesis, mural thrombus again noted at apex, grade 1 DD, no effusion.   In between these hospitalizations she has continued to have occasional chest pain. She was evaluated for RLE pain in the ED 04/08/15 when she underwent a doppler that was negative for DVT. Her RLE pain has resolved since that time. She reported mostly adhering to her medications although ran out of her warfarin a few days prior - INR 1.09 on admission. Reviewing INRs it appears that this is consistently subtherapeutic. She was admitted for further evaluation. She was found to be hypertensive up to 162/127. She was placed on NTG drip as well as Lovenox. D-dimer was negative. CXR was nonacute. Labwork was notable for troponins of 0.29->0.76->0.81->0.81. Hgb was 10-11, c/w prior. TSH, ESR, CRP were normal. UDS + opiates but otherwise neg. Her troponin trend was felt nonspecific and c/w prior. Her symptoms were not felt consistent with an acute coronary syndrome. Etiology of chest pain is not known at this time. She was continued on GERD rx. Dr. CStanford Breeddid not recommend any further cardiac workup. Of note the patient's Ranexa was held on admission due to NTG gtt and Dr. CStanford Breeddid not think this needed to  be resumed at discharge. Plavix was continued. He recommended f/u PCP for further workup of noncardiac chest pain. He has seen and examined the patient today and feels she is stable for discharge. He recommends to continue Lovenox->Coumadin until INR therapeutic with INR check on Thursday. I discussed Coumadin dosing with pharmacy prior to discharge who recommended  49m daily today and tomorrow, along with 1117mof Lovenox today and tomorrow, with INR check Thursday as planned. I discussed the Lovenox dosing with pharmacy who recommend to use the 1206myringes the patient has at home and inject 0.33m60mily - they have instructed the patient how to do so at bedside. The patient reports she has 10 syringes at home so she did not require new RX.  The patient requested additional refills on her home Xanax prescription and was advised to discuss with her primary care. _____________  Discharge Vitals Blood pressure 92/60, pulse 62, temperature 98.1 F (36.7 C), temperature source Oral, resp. rate 12, height _0  (1.676 m), weight 158 lb (71.668 kg), last menstrual period 04/02/2015, SpO2 100 %.  Filed Weights   04/22/15 2132  Weight: 158 lb (71.668 kg)    Labs & Radiologic Studies     CBC  Recent Labs  04/22/15 2156 04/24/15 0220  WBC 8.9 5.8  HGB 11.2* 10.0*  HCT 34.8* 32.2*  MCV 89.0 89.9  PLT 285 238 709asic Metabolic Panel  Recent Labs  04/22/15 2156  NA 138  K 3.6  CL 103  CO2 21*  GLUCOSE 116*  BUN 8  CREATININE 0.67  CALCIUM 9.5   Cardiac Enzymes  Recent Labs  04/23/15 0630 04/23/15 1119 04/23/15 1756  TROPONINI 0.76* 0.81* 0.81*   BNP Invalid input(s): POCBNP D-Dimer  Recent Labs  04/22/15 2215  DDIMER 0.30   Hemoglobin A1C  Recent Labs  04/23/15 0630  HGBA1C 5.3   Fasting Lipid Panel  Recent Labs  04/23/15 0630  CHOL 197  HDL 55  LDLCALC 72  TRIG 351*  CHOLHDL 3.6   Thyroid Function Tests  Recent Labs  04/23/15 0400  TSH 2.842    Dg Chest 2 View  04/22/2015  CLINICAL DATA:  Acute onset of left-sided chest and arm pain. Shortness of breath and nausea. Tachycardia. Initial encounter. EXAM: CHEST  2 VIEW COMPARISON:  Chest radiograph from 04/08/2015 FINDINGS: The lungs are well-aerated and clear. There is no evidence of focal opacification, pleural effusion or pneumothorax. The heart is  normal in size; the mediastinal contour is within normal limits. No acute osseous abnormalities are seen. IMPRESSION: No acute cardiopulmonary process seen. Electronically Signed   By: JeffGarald Balding.   On: 04/22/2015 22:47   Dg Chest 2 View  04/08/2015  CLINICAL DATA:  Shortness of breath. History of thromboembolic disease. EXAM: CHEST  2 VIEW COMPARISON:  03/24/2015 FINDINGS: Cardiomediastinal silhouette is normal. Mediastinal contours appear intact. There is no evidence of focal airspace consolidation, pleural effusion or pneumothorax. Osseous structures are without acute abnormality. Soft tissues are grossly normal. IMPRESSION: No active cardiopulmonary disease. Electronically Signed   By: DobrFidela Salisbury.   On: 04/08/2015 13:07    Disposition   Pt is being discharged home today in good condition.  Follow-up Plans & Appointments    Follow-up Information    [facility]   Why:  Coumadin Clinic - Thursday 04/26/15 at 1:30pm   Contact information:   [address]  [place] [address] [phone]      Follow up with Erlene Quan, PA-C.   Specialties:  Cardiology, Radiology   Why:  Follow-up Appointment 05/25/15 at [place] Lurena Joiner is one of Dr. Jacalyn Lefevre PAs.)   Contact information:   [address] [place] [address] [phone]      Discharge Instructions    Diet - low sodium heart healthy    Complete by:  As directed      Increase activity slowly    Complete by:  As directed   Your Lovenox/enoxaparin dose was changed to 155m daily at bedtime (until instructed to stop by Coumadin Clinic). Your Coumadin/warfarin dose will remain 117mdaily for now. Dr. CrStanford Breedas stopped your Ranexa.  Please follow up with your primary care provider for further evaluation of non-heart causes of chest pain.           Discharge Medications   Current Discharge Medication List      CONTINUE these medications which have CHANGED   Details  enoxaparin (LOVENOX) 120 MG/0.8ML injection Inject 0.73 mLs (110 mg total) into the skin at bedtime. Until instructed to stop by Coumadin Clinic.   Associated Diagnoses: Apical mural thrombus ([facility]     CONTINUE these medications which have NOT CHANGED   Details  acetaminophen (TYLENOL) 500 MG tablet Take 1,000 mg by mouth every 6 (six) hours as needed for headache (pain).    ALPRAZolam (XANAX) 0.25 MG tablet Take 1 tablet (0.25 mg total) by mouth at bedtime as needed for anxiety or sleep.     amLODipine (NORVASC) 5 MG tablet Take 5 mg by mouth daily.    atorvastatin (LIPITOR) 40 MG tablet Take 1 tablet (40 mg total) by mouth daily.     carvedilol (COREG) 25 MG tablet Take 1 tablet (25 mg total) by mouth 2 (two) times daily with a meal.     clopidogrel (PLAVIX) 75 MG tablet Take 1 tablet (75 mg total) by mouth daily.     hydrocortisone-pramoxine (PROCTOFOAM-HC) rectal foam Place 1 applicator rectally 2 (two) times daily.     nitroGLYCERIN (NITROSTAT) 0.4 MG SL tablet Place 1 tablet (0.4 mg total) under the tongue every 5 (five) minutes as needed for chest pain (up to 3 doses).    Associated Diagnoses: Atherosclerosis of native coronary artery of native heart without angina pectoris    ondansetron (ZOFRAN ODT) 4 MG disintegrating tablet Take 1 tablet (4 mg total) by mouth every 8 (eight) hours as needed for nausea or vomiting.     oxyCODONE-acetaminophen (PERCOCET/ROXICET) 5-325 MG tablet Take 2 tablets by mouth every 4 (four) hours as needed for severe pain.     pantoprazole (PROTONIX) 40 MG tablet Take 1 tablet (40 mg total) by mouth daily.     sertraline (ZOLOFT) 50 MG tablet Take 100 mg by mouth daily.    sucralfate (CARAFATE) 1 GM/10ML suspension Take 10 mLs (1 g total) by mouth 4 (four) times daily -  with meals and at bedtime.     warfarin (COUMADIN) 5 MG tablet Take 3 tablets (15 mg total) by mouth daily at  6 PM.       STOP taking these medications     ranolazine (RANEXA) 500 MG 12 hr tablet           Outstanding Labs/Studies   N/A  Duration of Discharge Encounter   Greater than 30 minutes including physician time.  Signed, DaCharlie PitterA-C 04/24/2015, 11:25 AM

## 2015-04-25 ENCOUNTER — Ambulatory Visit (HOSPITAL_COMMUNITY): Payer: Medicaid Other

## 2015-04-25 LAB — TROPONIN I: TROPONIN I: 0.76 ng/mL — AB (ref <0.031)

## 2015-04-26 ENCOUNTER — Ambulatory Visit: Payer: Medicaid Other | Admitting: Pharmacist Clinician (PhC)/ Clinical Pharmacy Specialist

## 2015-04-27 ENCOUNTER — Ambulatory Visit (HOSPITAL_COMMUNITY): Payer: Medicaid Other

## 2015-04-30 ENCOUNTER — Ambulatory Visit (HOSPITAL_COMMUNITY): Payer: Medicaid Other

## 2015-05-02 ENCOUNTER — Ambulatory Visit (HOSPITAL_COMMUNITY): Payer: Medicaid Other

## 2015-05-02 NOTE — Addendum Note (Signed)
Addended by: Onnie Boer on: 05/02/2015 10:55 AM   Modules accepted: Orders

## 2015-05-04 ENCOUNTER — Ambulatory Visit (HOSPITAL_COMMUNITY): Payer: Medicaid Other

## 2015-05-07 ENCOUNTER — Ambulatory Visit (HOSPITAL_COMMUNITY): Payer: Medicaid Other

## 2015-05-09 ENCOUNTER — Ambulatory Visit (HOSPITAL_COMMUNITY): Payer: Medicaid Other

## 2015-05-11 ENCOUNTER — Ambulatory Visit (HOSPITAL_COMMUNITY): Payer: Medicaid Other

## 2015-05-14 ENCOUNTER — Ambulatory Visit (HOSPITAL_COMMUNITY): Payer: Medicaid Other

## 2015-05-16 ENCOUNTER — Ambulatory Visit (HOSPITAL_COMMUNITY): Payer: Medicaid Other

## 2015-05-18 ENCOUNTER — Ambulatory Visit (HOSPITAL_COMMUNITY): Payer: Medicaid Other

## 2015-05-21 ENCOUNTER — Ambulatory Visit (HOSPITAL_COMMUNITY): Payer: Medicaid Other

## 2015-05-23 ENCOUNTER — Ambulatory Visit (HOSPITAL_COMMUNITY): Payer: Medicaid Other

## 2015-05-25 ENCOUNTER — Ambulatory Visit (HOSPITAL_COMMUNITY): Payer: Medicaid Other

## 2015-05-25 ENCOUNTER — Ambulatory Visit: Payer: Medicaid Other | Admitting: Cardiology

## 2015-06-08 ENCOUNTER — Ambulatory Visit (INDEPENDENT_AMBULATORY_CARE_PROVIDER_SITE_OTHER): Payer: Medicaid Other | Admitting: Cardiology

## 2015-06-08 ENCOUNTER — Encounter: Payer: Self-pay | Admitting: Cardiology

## 2015-06-08 VITALS — BP 140/90 | HR 79 | Ht 66.0 in | Wt 173.2 lb

## 2015-06-08 DIAGNOSIS — I213 ST elevation (STEMI) myocardial infarction of unspecified site: Secondary | ICD-10-CM

## 2015-06-08 DIAGNOSIS — I214 Non-ST elevation (NSTEMI) myocardial infarction: Secondary | ICD-10-CM | POA: Diagnosis not present

## 2015-06-08 DIAGNOSIS — I10 Essential (primary) hypertension: Secondary | ICD-10-CM | POA: Diagnosis not present

## 2015-06-08 DIAGNOSIS — D6859 Other primary thrombophilia: Secondary | ICD-10-CM

## 2015-06-08 DIAGNOSIS — I2510 Atherosclerotic heart disease of native coronary artery without angina pectoris: Secondary | ICD-10-CM

## 2015-06-08 DIAGNOSIS — Z9861 Coronary angioplasty status: Secondary | ICD-10-CM

## 2015-06-08 DIAGNOSIS — Z7901 Long term (current) use of anticoagulants: Secondary | ICD-10-CM

## 2015-06-08 DIAGNOSIS — I513 Intracardiac thrombosis, not elsewhere classified: Secondary | ICD-10-CM

## 2015-06-08 DIAGNOSIS — E785 Hyperlipidemia, unspecified: Secondary | ICD-10-CM

## 2015-06-08 MED ORDER — RIVAROXABAN 20 MG PO TABS: 20.0000 mg | ORAL_TABLET | Freq: Every day | ORAL | Status: DC

## 2015-06-08 NOTE — Assessment & Plan Note (Signed)
Admitted again in Feb 2017- Troponin 0.81. She was not cathed. Doing well- no chest pain- since discharge

## 2015-06-08 NOTE — Patient Instructions (Signed)
Stop Coumadin   Start Xarelto 20 mg daily with supper   Call back if insurance does not accept   Your physician wants you to follow-up in: 6 months with Dr.Crenshaw. You will receive a reminder letter in the mail two months in advance. If you don't receive a letter, please call our office to schedule the follow-up appointment. '

## 2015-06-08 NOTE — Assessment & Plan Note (Signed)
Controlled.  

## 2015-06-08 NOTE — Assessment & Plan Note (Signed)
She has been repeatedly non compliant with INR checks. Change to Xarelto 20 mg

## 2015-06-08 NOTE — Assessment & Plan Note (Signed)
LDL 72 in Feb 2017- on statin Rx

## 2015-06-08 NOTE — Assessment & Plan Note (Signed)
This has been listed in her history but I could not find a note from Hematology. She did have undetectable anticardiolipin antibody in 2011

## 2015-06-08 NOTE — Assessment & Plan Note (Signed)
BMS to proximal LAD 2009

## 2015-06-08 NOTE — Assessment & Plan Note (Signed)
Seen on echo 03/26/15

## 2015-06-08 NOTE — Progress Notes (Signed)
06/08/2015 Lindsey [name]   24-Jun-1970  [id number]  Primary Physician [name], MD Primary Cardiologist: Dr Jens Som  HPI:  44 y/o AA F with history of CAD (apical infarct 2000, NSTEMI 2009 s/p BMS to pLAD), apical mural thrombus , TIA (2010), possible ATIII Deficiency, HTN, HLD, & prior tobacco use who presented to [facility] with chest pain on 04/22/15. Her Troponin was 0.81. She had a cath in Nov 2016 for chest pain which showed a patent LAD stent and non obstructive CAD. Plan is for continued medical Rx. She is in the office today for follow up. She has done well since discharge- no further chest pain. She has not been compliant with INR check and has repeatedly had low INRs.    Current Outpatient Prescriptions  Medication Sig Dispense Refill  . acetaminophen (TYLENOL) 500 MG tablet Take 1,000 mg by mouth every 6 (six) hours as needed for headache (pain).    Marland Kitchen ALPRAZolam (XANAX) 0.25 MG tablet Take 1 tablet (0.25 mg total) by mouth at bedtime as needed for anxiety or sleep. 30 tablet 0  . amLODipine (NORVASC) 5 MG tablet Take 5 mg by mouth daily.    Marland Kitchen atorvastatin (LIPITOR) 40 MG tablet Take 1 tablet (40 mg total) by mouth daily. 30 tablet 2  . carvedilol (COREG) 25 MG tablet Take 1 tablet (25 mg total) by mouth 2 (two) times daily with a meal. 60 tablet 6  . clopidogrel (PLAVIX) 75 MG tablet Take 1 tablet (75 mg total) by mouth daily. 30 tablet 6  . enoxaparin (LOVENOX) 120 MG/0.8ML injection Inject 0.73 mLs (110 mg total) into the skin at bedtime. Until instructed to stop by Coumadin Clinic.    . hydrocortisone-pramoxine (PROCTOFOAM-HC) rectal foam Place 1 applicator rectally 2 (two) times daily. (Patient taking differently: Place 1 applicator rectally daily as needed for hemorrhoids. ) 10 g 0  . nitroGLYCERIN (NITROSTAT) 0.4 MG SL tablet Place 1 tablet (0.4 mg total) under the tongue every 5 (five) minutes as needed for chest pain (up to 3 doses). 25 tablet 3  .  ondansetron (ZOFRAN ODT) 4 MG disintegrating tablet Take 1 tablet (4 mg total) by mouth every 8 (eight) hours as needed for nausea or vomiting. 10 tablet 0  . pantoprazole (PROTONIX) 40 MG tablet Take 1 tablet (40 mg total) by mouth daily. 30 tablet 2  . sertraline (ZOLOFT) 50 MG tablet Take 100 mg by mouth daily.    . sucralfate (CARAFATE) 1 GM/10ML suspension Take 10 mLs (1 g total) by mouth 4 (four) times daily -  with meals and at bedtime. 420 mL 0  . rivaroxaban (XARELTO) 20 MG TABS tablet Take 1 tablet (20 mg total) by mouth daily with supper. 30 tablet 6   No current facility-administered medications for this visit.    No Known Allergies  Social History   Social History  . Marital Status: Divorced    Spouse Name: N/A  . Number of Children: 2  . Years of Education: N/A   Occupational History  . Writes grants for [employer]    Social History Main Topics  . Smoking status: Former Smoker -- 0.20 packs/day for 1 years    Types: Cigarettes    Quit date: 11/26/2014  . Smokeless tobacco: Never Used     Comment: STOPPED SMOKING IN SEPTEMBER 2016  . Alcohol Use: 0.0 oz/week    0 Cans of beer per week     Comment: Social drinker  .  Drug Use: Yes    Special: Cocaine     Comment: hx of use x 1  . Sexual Activity: No   Other Topics Concern  . Not on file   Social History Narrative     Review of Systems: General: negative for chills, fever, night sweats or weight changes.  Cardiovascular: negative for chest pain, dyspnea on exertion, edema, orthopnea, palpitations, paroxysmal nocturnal dyspnea or shortness of breath Dermatological: negative for rash Respiratory: negative for cough or wheezing Urologic: negative for hematuria Abdominal: negative for nausea, vomiting, diarrhea, bright red blood per rectum, melena, or hematemesis Neurologic: negative for visual changes, syncope, or dizziness All other systems reviewed and are otherwise negative except as noted  above.    Blood pressure 140/90, pulse 79, height  (1.676 m), weight 173 lb 3.2 oz (78.563 kg).  General appearance: alert, cooperative and no distress Neck: no carotid bruit and no JVD Lungs: clear to auscultation bilaterally Heart: regular rate and rhythm Extremities: no edema Neurologic: Grossly normal  EKG NSR, septal Qs (old)  ASSESSMENT AND PLAN:   NSTEMI (non-ST elevated myocardial infarction) [facility]) Admitted again in Feb 2017- Troponin 0.81. She was not cathed. Doing well- no chest pain- since discharge  CAD S/P  BMS to proximal LAD 2009-patent 01/29/15  BMS to proximal LAD 2009  Apical mural thrombus (HCC) Seen on echo 03/26/15  Essential hypertension Controlled  Long term current use of anticoagulant therapy She has been repeatedly non compliant with INR checks. Change to Xarelto 20 mg  Antithrombin III deficiency (HCC) This has been listed in her history but I could not find a note from Hematology. She did have undetectable anticardiolipin antibody in 2011  Hyperlipemia LDL 72 in Feb 2017- on statin Rx   PLAN  Discussed with Dr Jens Som- though a NOAC is not indicated for LVT the pt has repeatedly had sub therapeutic INRs and has been non compliant with INR checks. We will take her off Coumadin and start Xarelto 20 mg daily (off label). F/U in 6 months with Dr Marsa Aris.   [name] K PA-C 06/08/2015 8:59 AM

## 2015-07-16 IMAGING — CR DG CHEST 2V
2 series · 2 of 2 positions shown · non-contrast
Comparison: CT chest 10/30/2013

CLINICAL DATA: Chest pain, shortness of breath

EXAM:
CHEST  2 VIEW

[chest pa]
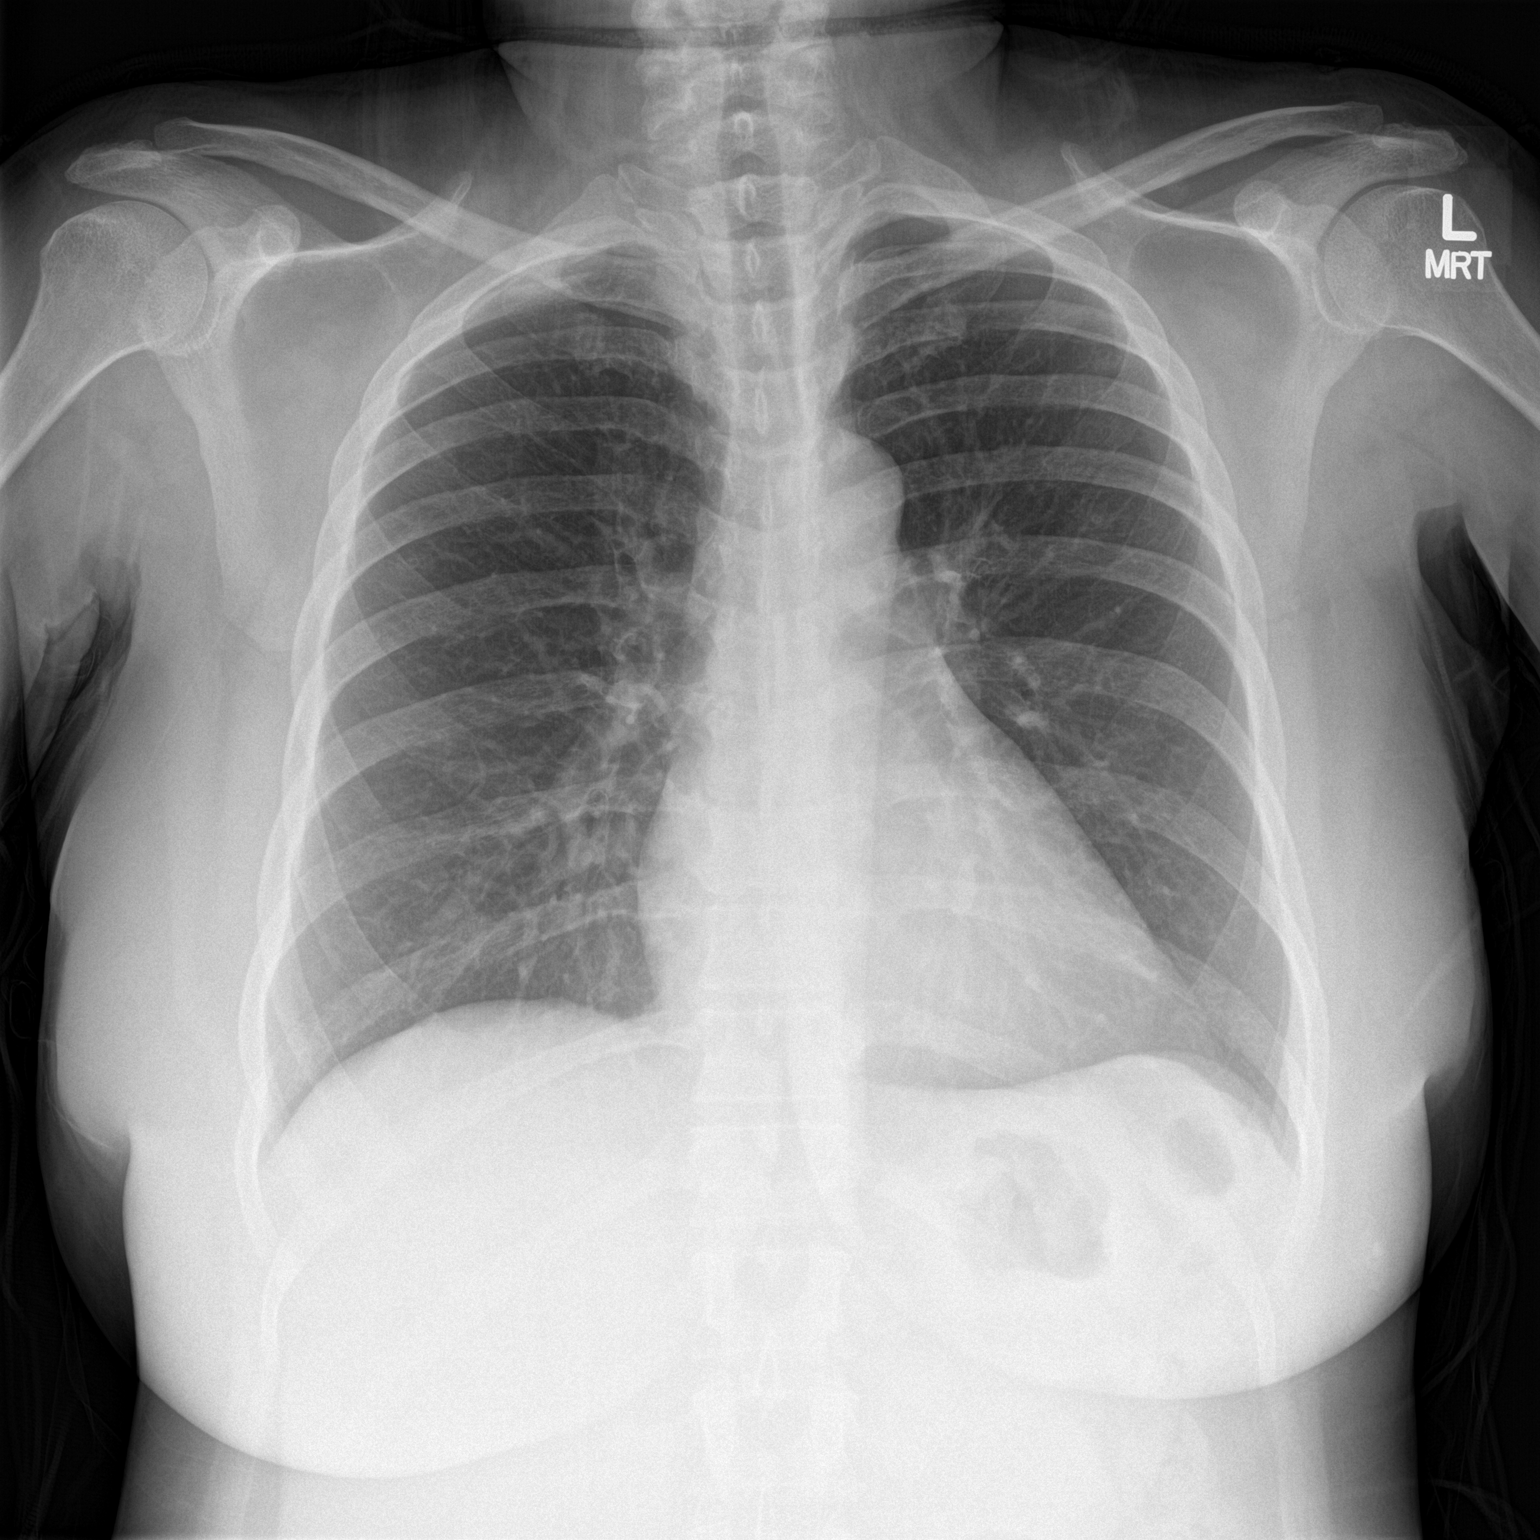

[chest lat]
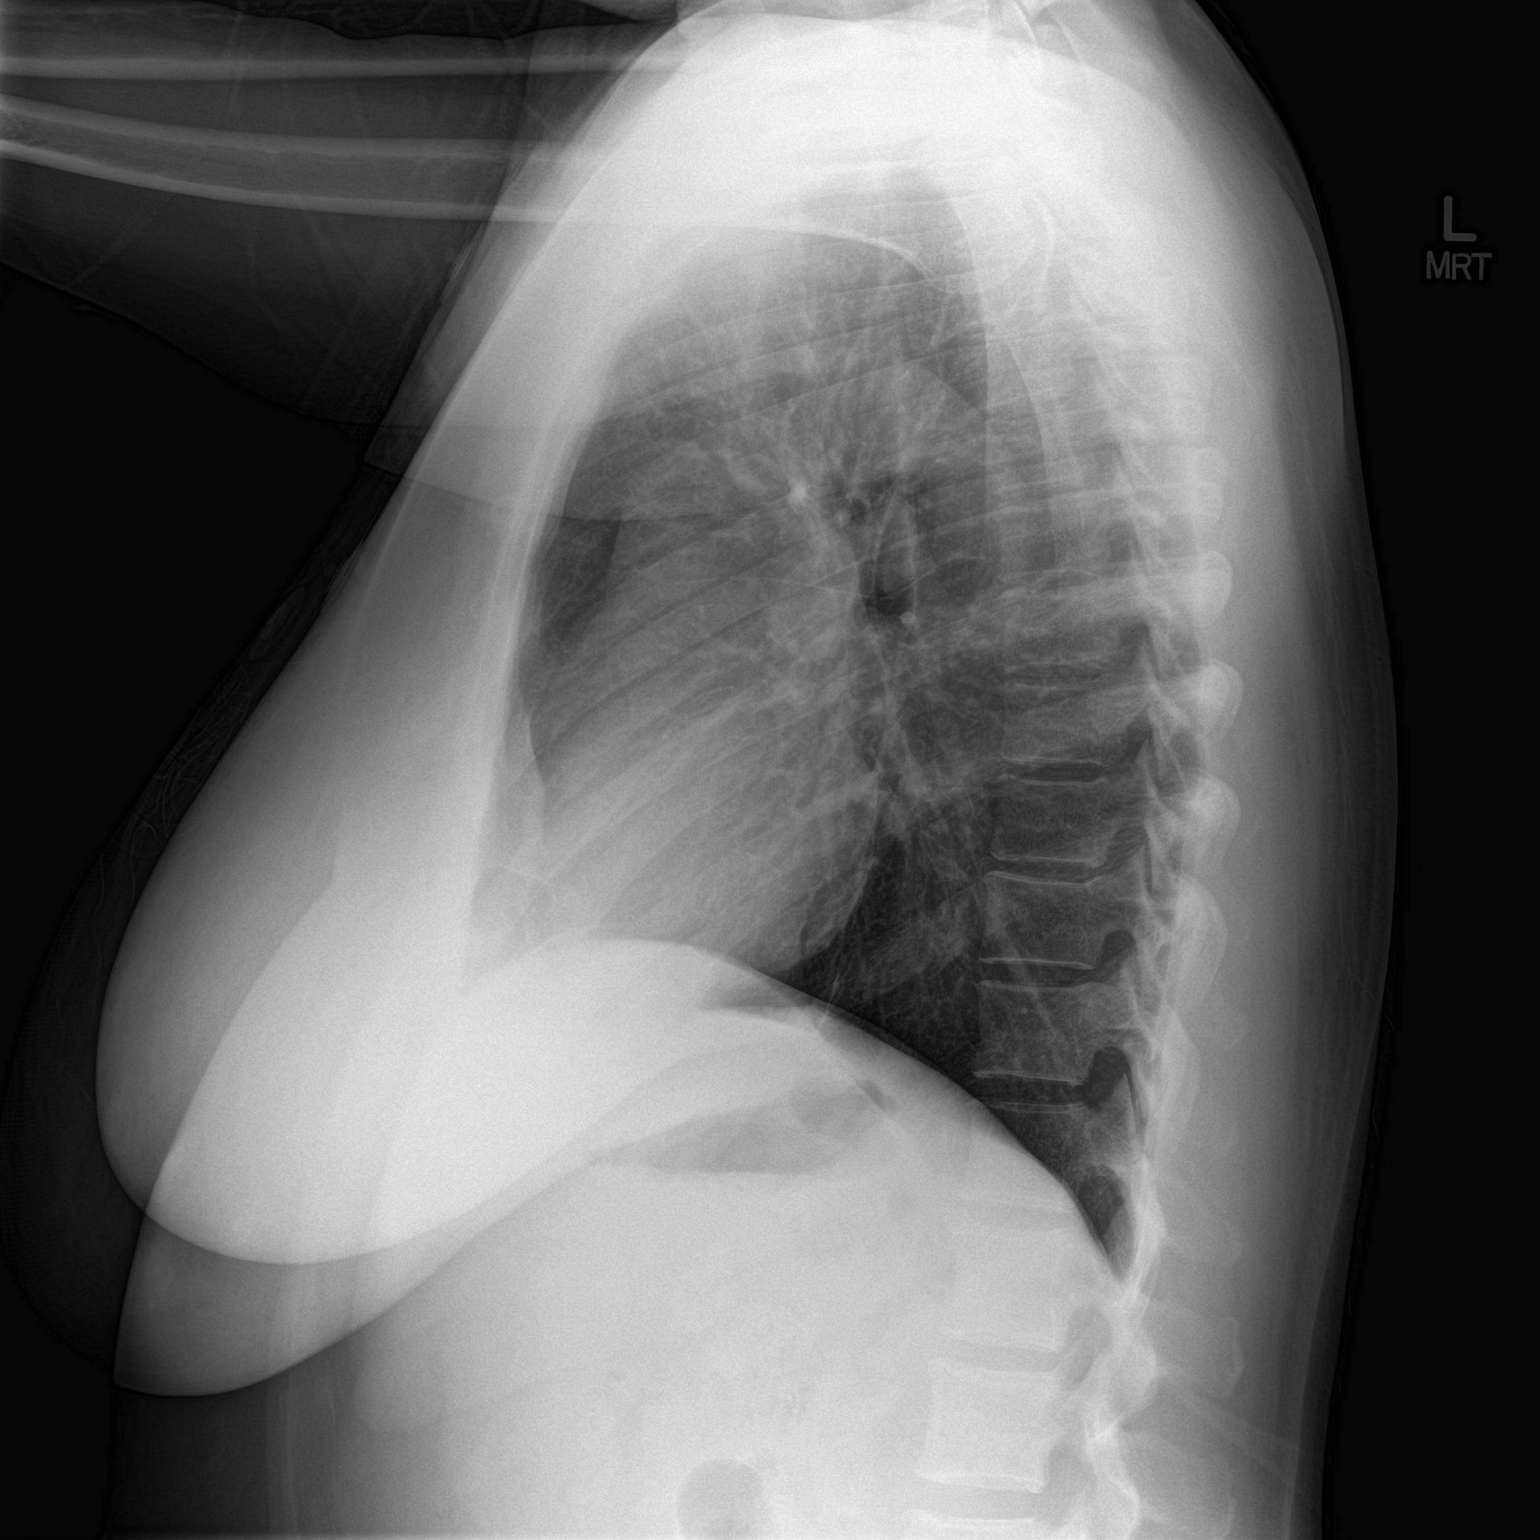

[2 of 2 positions shown; findings below may reference images not displayed]

FINDINGS: The heart size and mediastinal contours are within normal limits.
Both lungs are clear. The visualized skeletal structures are
unremarkable.
IMPRESSION: No active cardiopulmonary disease.

## 2015-07-21 ENCOUNTER — Encounter (HOSPITAL_COMMUNITY): Payer: Self-pay | Admitting: Emergency Medicine

## 2015-07-21 ENCOUNTER — Emergency Department (HOSPITAL_COMMUNITY)
Admission: EM | Admit: 2015-07-21 | Discharge: 2015-07-22 | Disposition: A | Discharge status: Home / Self Care | Payer: Medicaid Other | Attending: Emergency Medicine | Admitting: Emergency Medicine

## 2015-07-21 ENCOUNTER — Emergency Department (HOSPITAL_COMMUNITY): Payer: Medicaid Other

## 2015-07-21 DIAGNOSIS — I252 Old myocardial infarction: Secondary | ICD-10-CM | POA: Diagnosis not present

## 2015-07-21 DIAGNOSIS — I10 Essential (primary) hypertension: Secondary | ICD-10-CM | POA: Insufficient documentation

## 2015-07-21 DIAGNOSIS — Z8673 Personal history of transient ischemic attack (TIA), and cerebral infarction without residual deficits: Secondary | ICD-10-CM | POA: Insufficient documentation

## 2015-07-21 DIAGNOSIS — R0789 Other chest pain: Secondary | ICD-10-CM | POA: Diagnosis not present

## 2015-07-21 DIAGNOSIS — F419 Anxiety disorder, unspecified: Secondary | ICD-10-CM | POA: Insufficient documentation

## 2015-07-21 DIAGNOSIS — Z862 Personal history of diseases of the blood and blood-forming organs and certain disorders involving the immune mechanism: Secondary | ICD-10-CM | POA: Insufficient documentation

## 2015-07-21 DIAGNOSIS — Z9119 Patient's noncompliance with other medical treatment and regimen: Secondary | ICD-10-CM | POA: Insufficient documentation

## 2015-07-21 DIAGNOSIS — K529 Noninfective gastroenteritis and colitis, unspecified: Secondary | ICD-10-CM

## 2015-07-21 DIAGNOSIS — I2510 Atherosclerotic heart disease of native coronary artery without angina pectoris: Secondary | ICD-10-CM | POA: Insufficient documentation

## 2015-07-21 DIAGNOSIS — K6289 Other specified diseases of anus and rectum: Secondary | ICD-10-CM | POA: Diagnosis not present

## 2015-07-21 DIAGNOSIS — Z87891 Personal history of nicotine dependence: Secondary | ICD-10-CM | POA: Insufficient documentation

## 2015-07-21 DIAGNOSIS — Z9861 Coronary angioplasty status: Secondary | ICD-10-CM | POA: Diagnosis not present

## 2015-07-21 DIAGNOSIS — Z9889 Other specified postprocedural states: Secondary | ICD-10-CM | POA: Diagnosis not present

## 2015-07-21 DIAGNOSIS — Z7902 Long term (current) use of antithrombotics/antiplatelets: Secondary | ICD-10-CM | POA: Insufficient documentation

## 2015-07-21 DIAGNOSIS — E785 Hyperlipidemia, unspecified: Secondary | ICD-10-CM | POA: Diagnosis not present

## 2015-07-21 DIAGNOSIS — Z79899 Other long term (current) drug therapy: Secondary | ICD-10-CM | POA: Diagnosis not present

## 2015-07-21 DIAGNOSIS — K219 Gastro-esophageal reflux disease without esophagitis: Secondary | ICD-10-CM | POA: Insufficient documentation

## 2015-07-21 DIAGNOSIS — R112 Nausea with vomiting, unspecified: Secondary | ICD-10-CM

## 2015-07-21 DIAGNOSIS — R197 Diarrhea, unspecified: Secondary | ICD-10-CM

## 2015-07-21 DIAGNOSIS — R079 Chest pain, unspecified: Secondary | ICD-10-CM | POA: Diagnosis present

## 2015-07-21 DIAGNOSIS — Z7901 Long term (current) use of anticoagulants: Secondary | ICD-10-CM | POA: Insufficient documentation

## 2015-07-21 LAB — CBC
HCT: 32.9 % — ABNORMAL LOW (ref 36.0–46.0)
Hemoglobin: 10.4 g/dL — ABNORMAL LOW (ref 12.0–15.0)
MCH: 28.1 pg (ref 26.0–34.0)
MCHC: 31.6 g/dL (ref 30.0–36.0)
MCV: 88.9 fL (ref 78.0–100.0)
PLATELETS: 175 10*3/uL (ref 150–400)
RBC: 3.7 MIL/uL — ABNORMAL LOW (ref 3.87–5.11)
RDW: 24.6 % — AB (ref 11.5–15.5)
WBC: 4.7 10*3/uL (ref 4.0–10.5)

## 2015-07-21 LAB — COMPREHENSIVE METABOLIC PANEL
ALBUMIN: 3.4 g/dL — AB (ref 3.5–5.0)
ALT: 20 U/L (ref 14–54)
ANION GAP: 13 (ref 5–15)
AST: 36 U/L (ref 15–41)
Alkaline Phosphatase: 88 U/L (ref 38–126)
BUN: 6 mg/dL (ref 6–20)
CHLORIDE: 107 mmol/L (ref 101–111)
CO2: 22 mmol/L (ref 22–32)
Calcium: 8.5 mg/dL — ABNORMAL LOW (ref 8.9–10.3)
Creatinine, Ser: 0.75 mg/dL (ref 0.44–1.00)
GFR calc Af Amer: >60 mL/min (ref >60)
Glucose, Bld: 114 mg/dL — ABNORMAL HIGH (ref 65–99)
POTASSIUM: 3.8 mmol/L (ref 3.5–5.1)
Sodium: 142 mmol/L (ref 135–145)
Total Bilirubin: 1.1 mg/dL (ref 0.3–1.2)
Total Protein: 6.3 g/dL — ABNORMAL LOW (ref 6.5–8.1)

## 2015-07-21 LAB — TROPONIN I: Troponin I: <0.03 ng/mL (ref <0.031)

## 2015-07-21 LAB — D-DIMER, QUANTITATIVE (NOT AT ARMC): D DIMER QUANT: 0.44 ug{FEU}/mL (ref 0.00–0.50)

## 2015-07-21 MED ORDER — SODIUM CHLORIDE 0.9 % IV SOLN
INTRAVENOUS | Status: DC
  Administered 2015-07-21: 17:00:00 via INTRAVENOUS

## 2015-07-21 MED ORDER — HYDROCODONE-ACETAMINOPHEN 5-325 MG PO TABS: 1.0000 | ORAL_TABLET | Freq: Four times a day (QID) | ORAL | Status: DC | PRN

## 2015-07-21 MED ORDER — PANTOPRAZOLE SODIUM 40 MG IV SOLR
40.0000 mg | Freq: Once | INTRAVENOUS | Status: AC
  Administered 2015-07-21: 40 mg via INTRAVENOUS
  Filled 2015-07-21: qty 40

## 2015-07-21 MED ORDER — HYDROCORTISONE 2.5 % RE CREA
TOPICAL_CREAM | Freq: Once | RECTAL | Status: AC
  Administered 2015-07-21: 1 via RECTAL
  Filled 2015-07-21: qty 28.35

## 2015-07-21 MED ORDER — ONDANSETRON HCL 4 MG/2ML IJ SOLN
4.0000 mg | Freq: Once | INTRAMUSCULAR | Status: AC
  Administered 2015-07-21: 4 mg via INTRAVENOUS
  Filled 2015-07-21: qty 2

## 2015-07-21 MED ORDER — IBUPROFEN 200 MG PO TABS
400.0000 mg | ORAL_TABLET | Freq: Once | ORAL | Status: AC
  Administered 2015-07-21: 400 mg via ORAL
  Filled 2015-07-21: qty 2

## 2015-07-21 MED ORDER — HYDROMORPHONE HCL 1 MG/ML IJ SOLN
1.0000 mg | Freq: Once | INTRAMUSCULAR | Status: AC
  Administered 2015-07-21: 1 mg via INTRAVENOUS
  Filled 2015-07-21: qty 1

## 2015-07-21 MED ORDER — HYDROCODONE-ACETAMINOPHEN 5-325 MG PO TABS
1.0000 | ORAL_TABLET | Freq: Once | ORAL | Status: AC
  Administered 2015-07-21: 1 via ORAL
  Filled 2015-07-21: qty 1

## 2015-07-21 MED ORDER — HYDROCORTISONE 2.5 % RE CREA: TOPICAL_CREAM | RECTAL | Status: DC

## 2015-07-21 MED ORDER — ALUM & MAG HYDROXIDE-SIMETH 200-200-20 MG/5ML PO SUSP
30.0000 mL | Freq: Once | ORAL | Status: AC
  Administered 2015-07-21: 30 mL via ORAL
  Filled 2015-07-21: qty 30

## 2015-07-21 NOTE — ED Provider Notes (Addendum)
CSN: [id number]     Arrival date & time 07/21/15  1524 History   First MD Initiated Contact with Patient 07/21/15 1525     Chief Complaint  Patient presents with  . Chest Pain     (Consider location/radiation/quality/duration/timing/severity/associated sxs/prior Treatment) Patient is a 45 y.o. female presenting with chest pain. The history is provided by the patient.  Chest Pain Associated symptoms: vomiting   Associated symptoms: no abdominal pain, no back pain, no cough, no fever, no headache, no numbness, no shortness of breath and no weakness   Patient w hx cad/stent, c/o mid to left chest pain in the past 2 days. Pain constant, dull, moderate, non radiating, not pleuritic. At rest. No specific exacerbating or alleviating factors. Patient unsure if same as prior cardiac cp, states then she had numbness, sob, which she doesn't have now.  Denies heartburn/reflux. No leg pain or swelling.  States also in past 2 days recurrent nvd syndrome, with multiple episodes of both vomiting and diarrhea. Diarrhea watery. Emesis clear, not bloody or bilious. No abd pain or distension. Also states wants checked for hemorrhoids, as hx same, and after diarrhea stools rectal pain.  No known ill contacts. Denies recent change in meds.      Past Medical History  Diagnosis Date  . Stroke (HCC)     assoc with short term memory loss and right peripheral vision loss; age 22   . Blood transfusion   . Anxiety   . Coronary artery disease     a. apical LAD infarction '00. b. NSTEMI s/p BMS to prox LAD '09. c. Cath 01/2015: stable LAD stent, otherwise minimal nonobstructive CAD.  Marland Kitchen Anemia   . Antithrombin III deficiency (HCC)     ?pt not sure if true diagnosis  . Tobacco abuse   . Myocardial infarction (HCC)   . Apical mural thrombus (HCC)   . TIA (transient ischemic attack) 2010  . Hypertension   . Hyperlipidemia   . GERD (gastroesophageal reflux disease)   . Noncompliance with medication regimen     a.  h/o noncompliance with med regimen (previous running out of Coumadin).   Past Surgical History  Procedure Laterality Date  . Tubal ligation    . Cardiac catheterization    . Esophagogastroduodenoscopy N/A 06/09/2012    Procedure: ESOPHAGOGASTRODUODENOSCOPY (EGD);  Surgeon: Theda Belfast, MD;  Location: [facility];  Service: Endoscopy;  Laterality: N/A;  . Coronary angioplasty    . Esophagogastroduodenoscopy N/A 08/02/2013    Procedure: ESOPHAGOGASTRODUODENOSCOPY (EGD);  Surgeon: Meryl Dare, MD;  Location: [facility] ENDOSCOPY;  Service: Endoscopy;  Laterality: N/A;  . Left heart catheterization with coronary angiogram N/A 12/24/2011    Procedure: LEFT HEART CATHETERIZATION WITH CORONARY ANGIOGRAM;  Surgeon: Kathleene Hazel, MD;  Location: [facility] CATH LAB;  Service: Cardiovascular;  Laterality: N/A;  . Colonoscopy N/A 03/29/2014    Procedure: COLONOSCOPY;  Surgeon: Louis Meckel, MD;  Location: [facility] ENDOSCOPY;  Service: Endoscopy;  Laterality: N/A;  . Cardiac catheterization N/A 01/29/2015    Procedure: Left Heart Cath and Coronary Angiography;  Surgeon: Peter M Swaziland, MD;  Location: [facility] INVASIVE CV LAB;  Service: Cardiovascular;  Laterality: N/A;   Family History  Problem Relation Age of Onset  . Heart disease Brother     arrhythmia; died  . Breast cancer Maternal Aunt    Social History  Substance Use Topics  . Smoking status: Former Smoker -- 0.20 packs/day for 1 years    Types: Cigarettes  Quit date: 11/26/2014  . Smokeless tobacco: Never Used     Comment: STOPPED SMOKING IN SEPTEMBER 2016  . Alcohol Use: 0.0 oz/week    0 Cans of beer per week     Comment: Social drinker   OB History    No data available     Review of Systems  Constitutional: Negative for fever and chills.  HENT: Negative for sore throat.   Eyes: Negative for redness.  Respiratory: Negative for cough and shortness of breath.   Cardiovascular: Positive for chest pain.  Gastrointestinal: Positive for  vomiting and diarrhea. Negative for abdominal pain.  Genitourinary: Negative for flank pain.  Musculoskeletal: Negative for back pain and neck pain.  Skin: Negative for rash.  Neurological: Negative for weakness, numbness and headaches.  Hematological: Does not bruise/bleed easily.  Psychiatric/Behavioral: Negative for confusion.      Allergies  Review of patient's allergies indicates no known allergies.  Home Medications   Prior to Admission medications   Medication Sig Start Date End Date Taking? Authorizing Provider  acetaminophen (TYLENOL) 500 MG tablet Take 1,000 mg by mouth every 6 (six) hours as needed for headache (pain).    Historical Provider, MD  ALPRAZolam Prudy Feeler) 0.25 MG tablet Take 1 tablet (0.25 mg total) by mouth at bedtime as needed for anxiety or sleep. 03/28/15   Ellsworth Lennox, PA  amLODipine (NORVASC) 5 MG tablet Take 5 mg by mouth daily.    Historical Provider, MD  atorvastatin (LIPITOR) 40 MG tablet Take 1 tablet (40 mg total) by mouth daily. 02/01/15   Darreld Mclean, MD  carvedilol (COREG) 25 MG tablet Take 1 tablet (25 mg total) by mouth 2 (two) times daily with a meal. 03/28/15   Ellsworth Lennox, PA  clopidogrel (PLAVIX) 75 MG tablet Take 1 tablet (75 mg total) by mouth daily. 03/28/15   Ellsworth Lennox, PA  enoxaparin (LOVENOX) 120 MG/0.8ML injection Inject 0.73 mLs (110 mg total) into the skin at bedtime. Until instructed to stop by [facility]. 04/24/15   Dayna N Dunn, PA-C  hydrocortisone-pramoxine (PROCTOFOAM-HC) rectal foam Place 1 applicator rectally 2 (two) times daily. Patient taking differently: Place 1 applicator rectally daily as needed for hemorrhoids.  06/15/14   Devoria Albe, MD  nitroGLYCERIN (NITROSTAT) 0.4 MG SL tablet Place 1 tablet (0.4 mg total) under the tongue every 5 (five) minutes as needed for chest pain (up to 3 doses). 10/14/13   Beatrice Lecher, PA-C  ondansetron (ZOFRAN ODT) 4 MG disintegrating tablet Take 1 tablet (4 mg total) by  mouth every 8 (eight) hours as needed for nausea or vomiting. 09/10/14   Emilia Beck, PA-C  pantoprazole (PROTONIX) 40 MG tablet Take 1 tablet (40 mg total) by mouth daily. 02/01/15   Darreld Mclean, MD  rivaroxaban (XARELTO) 20 MG TABS tablet Take 1 tablet (20 mg total) by mouth daily with supper. 06/08/15   Abelino Derrick, PA-C  sertraline (ZOLOFT) 50 MG tablet Take 100 mg by mouth daily.    Historical Provider, MD  sucralfate (CARAFATE) 1 GM/10ML suspension Take 10 mLs (1 g total) by mouth 4 (four) times daily -  with meals and at bedtime. 12/22/14   Darreld Mclean, MD   BP 144/105 mmHg  Pulse 89  Temp(Src) 98.4 F (36.9 C) (Oral)  Resp 18  Ht  (1.676 m)  Wt 74.844 kg  BMI 26.64 kg/m2  SpO2 100% Physical Exam  Constitutional: She appears well-developed and well-nourished. No distress.  HENT:  Mouth/Throat:  Oropharynx is clear and moist.  Eyes: Conjunctivae are normal. No scleral icterus.  Neck: Neck supple. No tracheal deviation present. No thyromegaly present.  Cardiovascular: Normal rate, regular rhythm, normal heart sounds and intact distal pulses.   No murmur heard. Pulmonary/Chest: Effort normal and breath sounds normal. No respiratory distress. She exhibits no tenderness.  Abdominal: Soft. Normal appearance and bowel sounds are normal. She exhibits no distension. There is no tenderness.  Genitourinary:  Old hemorrhoid/skin tags, no acutely thrombosed or inflamed external hemorrhoids noted. No mass. No fissure seen. Light brown stool   Musculoskeletal: She exhibits no edema or tenderness.  Neurological: She is alert.  Skin: Skin is warm and dry. No rash noted. She is not diaphoretic.  Psychiatric: She has a normal mood and affect.  Nursing note and vitals reviewed.   ED Course  Procedures (including critical care time) Labs Review  Results for orders placed or performed during the hospital encounter of 07/21/15  CBC  Result Value Ref Range   WBC 4.7 4.0 - 10.5 K/uL    RBC 3.70 (L) 3.87 - 5.11 MIL/uL   Hemoglobin 10.4 (L) 12.0 - 15.0 g/dL   HCT 58.5 (L) 92.9 - 24.4 %   MCV 88.9 78.0 - 100.0 fL   MCH 28.1 26.0 - 34.0 pg   MCHC 31.6 30.0 - 36.0 g/dL   RDW 62.8 (H) 63.8 - 17.7 %   Platelets 175 150 - 400 K/uL  Comprehensive metabolic panel  Result Value Ref Range   Sodium 142 135 - 145 mmol/L   Potassium 3.8 3.5 - 5.1 mmol/L   Chloride 107 101 - 111 mmol/L   CO2 22 22 - 32 mmol/L   Glucose, Bld 114 (H) 65 - 99 mg/dL   BUN 6 6 - 20 mg/dL   Creatinine, Ser 1.16 0.44 - 1.00 mg/dL   Calcium 8.5 (L) 8.9 - 10.3 mg/dL   Total Protein 6.3 (L) 6.5 - 8.1 g/dL   Albumin 3.4 (L) 3.5 - 5.0 g/dL   AST 36 15 - 41 U/L   ALT 20 14 - 54 U/L   Alkaline Phosphatase 88 38 - 126 U/L   Total Bilirubin 1.1 0.3 - 1.2 mg/dL   GFR calc non Af Amer >60 >60 mL/min   GFR calc Af Amer >60 >60 mL/min   Anion gap 13 5 - 15  Troponin I  Result Value Ref Range   Troponin I <0.03 <0.031 ng/mL  Troponin I  Result Value Ref Range   Troponin I <0.03 <0.031 ng/mL  D-dimer, quantitative (not at [facility])  Result Value Ref Range   D-Dimer, Quant 0.44 0.00 - 0.50 ug/mL-FEU   Dg Chest Port 1 View  07/21/2015  CLINICAL DATA:  Acute chest pain for 2 days. EXAM: PORTABLE CHEST 1 VIEW COMPARISON:  04/22/2015 and prior chest radiographs FINDINGS: The cardiomediastinal silhouette is unremarkable. There is no evidence of focal airspace disease, pulmonary edema, suspicious pulmonary nodule/mass, pleural effusion, or pneumothorax. No acute bony abnormalities are identified. IMPRESSION: No active disease. Electronically Signed   By: Harmon Pier M.D.   On: 07/21/2015 16:18      I have personally reviewed and evaluated these images and lab results as part of my medical decision-making.   EKG Interpretation   Date/Time:  Saturday Jul 21 2015 15:36:26 EDT Ventricular Rate:  94 PR Interval:  132 QRS Duration: 85 QT Interval:  369 QTC Calculation: 461 R Axis:   50 Text Interpretation:   Sinus arrhythmia Nonspecific T  wave abnormality  Confirmed by [facility]  MD, Caryn Bee ([id number]) on 07/21/2015 3:41:04 PM      MDM   Iv ns bolus. zofran iv.  Labs. Cxr. Ecg.  Reviewed nursing notes and prior charts for additional history.   Symptoms ongoing for 2 days, after which trop x 2 negative/normal, ddimer also normal.   Symptoms improved w meds in ED.  No recurrent nvd.   Repeat ecg unchanged from prior.  Symptoms and ED workup appear not c/w ACS.   Pt on recheck comfortable. No distress.  Patient currently appears stable for d/c.  rec close cardiology and pcp f/u.  Return precautions provided.       Cathren Laine, MD 07/21/15 2144

## 2015-07-21 NOTE — Discharge Instructions (Signed)
It was our pleasure to provide your ER care today - we hope that you feel better.  Rest. Drink plenty of fluids.  If gi/reflux symptoms, continue protonix, and you may try pepcid and/or maalox as need for symptom relief.  You may take hydrocodone as need for pain. No driving when taking hydrocodone. Also, do not take tylenol or acetaminophen containing medication when taking hydrocodone.   If anal pain, you may try anusol cream as need.   Follow up with your doctor/cardiologist in the next couple days for recheck - call office Monday morning to arrange that follow up.  Return to ER right away if worse, new symptoms, fevers, severe diarrhea, persistent vomiting, trouble breathing, persistent/recurrent chest pain, other concern.  You were given pain medication in the ER - no driving for the next 4 hours.    Nausea and Vomiting Nausea is a sick feeling that often comes before throwing up (vomiting). Vomiting is a reflex where stomach contents come out of your mouth. Vomiting can cause severe loss of body fluids (dehydration). Children and elderly adults can become dehydrated quickly, especially if they also have diarrhea. Nausea and vomiting are symptoms of a condition or disease. It is important to find the cause of your symptoms. CAUSES   Direct irritation of the stomach lining. This irritation can result from increased acid production (gastroesophageal reflux disease), infection, food poisoning, taking certain medicines (such as nonsteroidal anti-inflammatory drugs), alcohol use, or tobacco use.  Signals from the brain.These signals could be caused by a headache, heat exposure, an inner ear disturbance, increased pressure in the brain from injury, infection, a tumor, or a concussion, pain, emotional stimulus, or metabolic problems.  An obstruction in the gastrointestinal tract (bowel obstruction).  Illnesses such as diabetes, hepatitis, gallbladder problems, appendicitis, kidney  problems, cancer, sepsis, atypical symptoms of a heart attack, or eating disorders.  Medical treatments such as chemotherapy and radiation.  Receiving medicine that makes you sleep (general anesthetic) during surgery. DIAGNOSIS Your caregiver may ask for tests to be done if the problems do not improve after a few days. Tests may also be done if symptoms are severe or if the reason for the nausea and vomiting is not clear. Tests may include:  Urine tests.  Blood tests.  Stool tests.  Cultures (to look for evidence of infection).  X-rays or other imaging studies. Test results can help your caregiver make decisions about treatment or the need for additional tests. TREATMENT You need to stay well hydrated. Drink frequently but in small amounts.You may wish to drink water, sports drinks, clear broth, or eat frozen ice pops or gelatin dessert to help stay hydrated.When you eat, eating slowly may help prevent nausea.There are also some antinausea medicines that may help prevent nausea. HOME CARE INSTRUCTIONS   Take all medicine as directed by your caregiver.  If you do not have an appetite, do not force yourself to eat. However, you must continue to drink fluids.  If you have an appetite, eat a normal diet unless your caregiver tells you differently.  Eat a variety of complex carbohydrates (rice, wheat, potatoes, bread), lean meats, yogurt, fruits, and vegetables.  Avoid high-fat foods because they are more difficult to digest.  Drink enough water and fluids to keep your urine clear or pale yellow.  If you are dehydrated, ask your caregiver for specific rehydration instructions. Signs of dehydration may include:  Severe thirst.  Dry lips and mouth.  Dizziness.  Dark urine.  Decreasing urine frequency  and amount.  Confusion.  Rapid breathing or pulse. SEEK IMMEDIATE MEDICAL CARE IF:   You have blood or brown flecks (like coffee grounds) in your vomit.  You have black  or bloody stools.  You have a severe headache or stiff neck.  You are confused.  You have severe abdominal pain.  You have chest pain or trouble breathing.  You do not urinate at least once every 8 hours.  You develop cold or clammy skin.  You continue to vomit for longer than 24 to 48 hours.  You have a fever. MAKE SURE YOU:   Understand these instructions.  Will watch your condition.  Will get help right away if you are not doing well or get worse.   This information is not intended to replace advice given to you by your health care provider. Make sure you discuss any questions you have with your health care provider.   Document Released: 03/03/2005 Document Revised: 05/26/2011 Document Reviewed: 07/31/2010 Elsevier Interactive Patient Education 2016 [employer]  Diarrhea Diarrhea is frequent loose and watery bowel movements. It can cause you to feel weak and dehydrated. Dehydration can cause you to become tired and thirsty, have a dry mouth, and have decreased urination that often is dark yellow. Diarrhea is a sign of another problem, most often an infection that will not last long. In most cases, diarrhea typically lasts 2-3 days. However, it can last longer if it is a sign of something more serious. It is important to treat your diarrhea as directed by your caregiver to lessen or prevent future episodes of diarrhea. CAUSES  Some common causes include:  Gastrointestinal infections caused by viruses, bacteria, or parasites.  Food poisoning or food allergies.  Certain medicines, such as antibiotics, chemotherapy, and laxatives.  Artificial sweeteners and fructose.  Digestive disorders. HOME CARE INSTRUCTIONS  Ensure adequate fluid intake (hydration): Have 1 cup (8 oz) of fluid for each diarrhea episode. Avoid fluids that contain simple sugars or sports drinks, fruit juices, whole milk products, and sodas. Your urine should be clear or pale yellow if you are drinking  enough fluids. Hydrate with an oral rehydration solution that you can purchase at pharmacies, retail stores, and online. You can prepare an oral rehydration solution at home by mixing the following ingredients together:   - tsp table salt.   tsp baking soda.   tsp salt substitute containing potassium chloride.  1  tablespoons sugar.  1 L (34 oz) of water.  Certain foods and beverages may increase the speed at which food moves through the gastrointestinal (GI) tract. These foods and beverages should be avoided and include:  Caffeinated and alcoholic beverages.  High-fiber foods, such as raw fruits and vegetables, nuts, seeds, and whole grain breads and cereals.  Foods and beverages sweetened with sugar alcohols, such as xylitol, sorbitol, and mannitol.  Some foods may be well tolerated and may help thicken stool including:  Starchy foods, such as rice, toast, pasta, low-sugar cereal, oatmeal, grits, baked potatoes, crackers, and bagels.  Bananas.  Applesauce.  Add probiotic-rich foods to help increase healthy bacteria in the GI tract, such as yogurt and fermented milk products.  Wash your hands well after each diarrhea episode.  Only take over-the-counter or prescription medicines as directed by your caregiver.  Take a warm bath to relieve any burning or pain from frequent diarrhea episodes. SEEK IMMEDIATE MEDICAL CARE IF:   You are unable to keep fluids down.  You have persistent vomiting.  You have  blood in your stool, or your stools are black and tarry.  You do not urinate in 6-8 hours, or there is only a small amount of very dark urine.  You have abdominal pain that increases or localizes.  You have weakness, dizziness, confusion, or light-headedness.  You have a severe headache.  Your diarrhea gets worse or does not get better.  You have a fever or persistent symptoms for more than 2-3 days.  You have a fever and your symptoms suddenly get worse. MAKE SURE  YOU:   Understand these instructions.  Will watch your condition.  Will get help right away if you are not doing well or get worse.   This information is not intended to replace advice given to you by your health care provider. Make sure you discuss any questions you have with your health care provider.   Document Released: 02/21/2002 Document Revised: 03/24/2014 Document Reviewed: 11/09/2011 Elsevier Interactive Patient Education 2016 [employer]  Nonspecific Chest Pain  Chest pain can be caused by many different conditions. There is always a chance that your pain could be related to something serious, such as a heart attack or a blood clot in your lungs. Chest pain can also be caused by conditions that are not life-threatening. If you have chest pain, it is very important to follow up with your health care provider. CAUSES  Chest pain can be caused by:  Heartburn.  Pneumonia or bronchitis.  Anxiety or stress.  Inflammation around your heart (pericarditis) or lung (pleuritis or pleurisy).  A blood clot in your lung.  A collapsed lung (pneumothorax). It can develop suddenly on its own (spontaneous pneumothorax) or from trauma to the chest.  Shingles infection (varicella-zoster virus).  Heart attack.  Damage to the bones, muscles, and cartilage that make up your chest wall. This can include:  Bruised bones due to injury.  Strained muscles or cartilage due to frequent or repeated coughing or overwork.  Fracture to one or more ribs.  Sore cartilage due to inflammation (costochondritis). RISK FACTORS  Risk factors for chest pain may include:  Activities that increase your risk for trauma or injury to your chest.  Respiratory infections or conditions that cause frequent coughing.  Medical conditions or overeating that can cause heartburn.  Heart disease or family history of heart disease.  Conditions or health behaviors that increase your risk of developing a blood  clot.  Having had chicken pox (varicella zoster). SIGNS AND SYMPTOMS Chest pain can feel like:  Burning or tingling on the surface of your chest or deep in your chest.  Crushing, pressure, aching, or squeezing pain.  Dull or sharp pain that is worse when you move, cough, or take a deep breath.  Pain that is also felt in your back, neck, shoulder, or arm, or pain that spreads to any of these areas. Your chest pain may come and go, or it may stay constant. DIAGNOSIS Lab tests or other studies may be needed to find the cause of your pain. Your health care provider may have you take a test called an ambulatory ECG (electrocardiogram). An ECG records your heartbeat patterns at the time the test is performed. You may also have other tests, such as:  Transthoracic echocardiogram (TTE). During echocardiography, sound waves are used to create a picture of all of the heart structures and to look at how blood flows through your heart.  Transesophageal echocardiogram (TEE).This is a more advanced imaging test that obtains images from inside your body.  It allows your health care provider to see your heart in finer detail.  Cardiac monitoring. This allows your health care provider to monitor your heart rate and rhythm in real time.  Holter monitor. This is a portable device that records your heartbeat and can help to diagnose abnormal heartbeats. It allows your health care provider to track your heart activity for several days, if needed.  Stress tests. These can be done through exercise or by taking medicine that makes your heart beat more quickly.  Blood tests.  Imaging tests. TREATMENT  Your treatment depends on what is causing your chest pain. Treatment may include:  Medicines. These may include:  Acid blockers for heartburn.  Anti-inflammatory medicine.  Pain medicine for inflammatory conditions.  Antibiotic medicine, if an infection is present.  Medicines to dissolve blood  clots.  Medicines to treat coronary artery disease.  Supportive care for conditions that do not require medicines. This may include:  Resting.  Applying heat or cold packs to injured areas.  Limiting activities until pain decreases. HOME CARE INSTRUCTIONS  If you were prescribed an antibiotic medicine, finish it all even if you start to feel better.  Avoid any activities that bring on chest pain.  Do not use any tobacco products, including cigarettes, chewing tobacco, or electronic cigarettes. If you need help quitting, ask your health care provider.  Do not drink alcohol.  Take medicines only as directed by your health care provider.  Keep all follow-up visits as directed by your health care provider. This is important. This includes any further testing if your chest pain does not go away.  If heartburn is the cause for your chest pain, you may be told to keep your head raised (elevated) while sleeping. This reduces the chance that acid will go from your stomach into your esophagus.  Make lifestyle changes as directed by your health care provider. These may include:  Getting regular exercise. Ask your health care provider to suggest some activities that are safe for you.  Eating a heart-healthy diet. A registered dietitian can help you to learn healthy eating options.  Maintaining a healthy weight.  Managing diabetes, if necessary.  Reducing stress. SEEK MEDICAL CARE IF:  Your chest pain does not go away after treatment.  You have a rash with blisters on your chest.  You have a fever. SEEK IMMEDIATE MEDICAL CARE IF:   Your chest pain is worse.  You have an increasing cough, or you cough up blood.  You have severe abdominal pain.  You have severe weakness.  You faint.  You have chills.  You have sudden, unexplained chest discomfort.  You have sudden, unexplained discomfort in your arms, back, neck, or jaw.  You have shortness of breath at any  time.  You suddenly start to sweat, or your skin gets clammy.  You feel nauseous or you vomit.  You suddenly feel light-headed or dizzy.  Your heart begins to beat quickly, or it feels like it is skipping beats. These symptoms may represent a serious problem that is an emergency. Do not wait to see if the symptoms will go away. Get medical help right away. Call your local emergency services (911 in the [place] Do not drive yourself to the hospital.   This information is not intended to replace advice given to you by your health care provider. Make sure you discuss any questions you have with your health care provider.   Document Released: 12/11/2004 Document Revised: 03/24/2014 Document Reviewed: 10/07/2013 Elsevier  Interactive Patient Education ©2016 Else[employer] Inc. ° °

## 2015-07-21 NOTE — ED Notes (Signed)
PER [facility]: Patient to ED from home c/o substernal CP radiating to L side of chest x 2 days, pt reports it comes and goes. CP is accompanied by SOB, nausea, dizziness. Pt has hx of 2 MIs and 2 CVAs. Per EMS, patient reported sudden onset of significant 9/10 CP today at 1300 - she took 1 NTG prior to EMS arrival with no relief. EMS gave 3 more NTG (total 4 SL NTG tablets) and 324 ASA en route - pt states her pain has decreased to a 6/10. 20g. LAC, pt also received 4mg  zofran by EMS. EMS VS: HR 110 ST, 144/110 (pt denies HTN), RR 18, and 98% 2L O2 for comfort. Patient A&O x 4, respirations e/u.

## 2015-07-21 NOTE — ED Notes (Signed)
Patient reports her pain is about the same, states "it is better than when I first came in but I still feel it and I don't want to go home feeling the way I came in." MD made aware.

## 2015-07-21 NOTE — ED Notes (Signed)
Patient requesting something for nausea. MD made aware and will see soon.

## 2015-07-22 NOTE — ED Notes (Signed)
Patient verbalized understanding of discharge instructions and denies any further needs or questions at this time. VS stable. Patient ambulatory with steady gait.  

## 2015-08-09 ENCOUNTER — Encounter (HOSPITAL_COMMUNITY): Payer: Self-pay | Admitting: Emergency Medicine

## 2015-08-09 ENCOUNTER — Emergency Department (HOSPITAL_COMMUNITY)
Admission: EM | Admit: 2015-08-09 | Discharge: 2015-08-09 | Disposition: A | Discharge status: Home / Self Care | Payer: Medicaid Other | Attending: Emergency Medicine | Admitting: Emergency Medicine

## 2015-08-09 DIAGNOSIS — Z7901 Long term (current) use of anticoagulants: Secondary | ICD-10-CM | POA: Insufficient documentation

## 2015-08-09 DIAGNOSIS — Z8673 Personal history of transient ischemic attack (TIA), and cerebral infarction without residual deficits: Secondary | ICD-10-CM | POA: Insufficient documentation

## 2015-08-09 DIAGNOSIS — I10 Essential (primary) hypertension: Secondary | ICD-10-CM | POA: Diagnosis not present

## 2015-08-09 DIAGNOSIS — Z79899 Other long term (current) drug therapy: Secondary | ICD-10-CM | POA: Insufficient documentation

## 2015-08-09 DIAGNOSIS — I2510 Atherosclerotic heart disease of native coronary artery without angina pectoris: Secondary | ICD-10-CM | POA: Insufficient documentation

## 2015-08-09 DIAGNOSIS — R1110 Vomiting, unspecified: Secondary | ICD-10-CM | POA: Diagnosis not present

## 2015-08-09 DIAGNOSIS — Z9114 Patient's other noncompliance with medication regimen: Secondary | ICD-10-CM | POA: Diagnosis not present

## 2015-08-09 DIAGNOSIS — I252 Old myocardial infarction: Secondary | ICD-10-CM | POA: Insufficient documentation

## 2015-08-09 DIAGNOSIS — Z87891 Personal history of nicotine dependence: Secondary | ICD-10-CM | POA: Insufficient documentation

## 2015-08-09 DIAGNOSIS — F419 Anxiety disorder, unspecified: Secondary | ICD-10-CM | POA: Insufficient documentation

## 2015-08-09 DIAGNOSIS — R1111 Vomiting without nausea: Secondary | ICD-10-CM

## 2015-08-09 DIAGNOSIS — Z862 Personal history of diseases of the blood and blood-forming organs and certain disorders involving the immune mechanism: Secondary | ICD-10-CM | POA: Diagnosis not present

## 2015-08-09 DIAGNOSIS — Z9861 Coronary angioplasty status: Secondary | ICD-10-CM | POA: Insufficient documentation

## 2015-08-09 DIAGNOSIS — K219 Gastro-esophageal reflux disease without esophagitis: Secondary | ICD-10-CM | POA: Insufficient documentation

## 2015-08-09 DIAGNOSIS — Z7902 Long term (current) use of antithrombotics/antiplatelets: Secondary | ICD-10-CM | POA: Insufficient documentation

## 2015-08-09 DIAGNOSIS — D649 Anemia, unspecified: Secondary | ICD-10-CM | POA: Insufficient documentation

## 2015-08-09 DIAGNOSIS — E785 Hyperlipidemia, unspecified: Secondary | ICD-10-CM | POA: Insufficient documentation

## 2015-08-09 DIAGNOSIS — R002 Palpitations: Secondary | ICD-10-CM | POA: Insufficient documentation

## 2015-08-09 DIAGNOSIS — E876 Hypokalemia: Secondary | ICD-10-CM

## 2015-08-09 LAB — COMPREHENSIVE METABOLIC PANEL
ALK PHOS: 106 U/L (ref 38–126)
ALT: 23 U/L (ref 14–54)
AST: 32 U/L (ref 15–41)
Albumin: 3.8 g/dL (ref 3.5–5.0)
Anion gap: 11 (ref 5–15)
BILIRUBIN TOTAL: 0.6 mg/dL (ref 0.3–1.2)
BUN: 7 mg/dL (ref 6–20)
CALCIUM: 9.2 mg/dL (ref 8.9–10.3)
CO2: 23 mmol/L (ref 22–32)
CREATININE: 0.66 mg/dL (ref 0.44–1.00)
Chloride: 107 mmol/L (ref 101–111)
Glucose, Bld: 106 mg/dL — ABNORMAL HIGH (ref 65–99)
Potassium: 3.4 mmol/L — ABNORMAL LOW (ref 3.5–5.1)
Sodium: 141 mmol/L (ref 135–145)
Total Protein: 7.3 g/dL (ref 6.5–8.1)

## 2015-08-09 LAB — CBC WITH DIFFERENTIAL/PLATELET
BASOS PCT: 1 %
Basophils Absolute: 0.1 10*3/uL (ref 0.0–0.1)
Eosinophils Absolute: 0 10*3/uL (ref 0.0–0.7)
Eosinophils Relative: 0 %
HEMATOCRIT: 34.4 % — AB (ref 36.0–46.0)
HEMOGLOBIN: 10.7 g/dL — AB (ref 12.0–15.0)
LYMPHS ABS: 1.4 10*3/uL (ref 0.7–4.0)
LYMPHS PCT: 17 %
MCH: 27.8 pg (ref 26.0–34.0)
MCHC: 31.1 g/dL (ref 30.0–36.0)
MCV: 89.4 fL (ref 78.0–100.0)
MONOS PCT: 6 %
Monocytes Absolute: 0.5 10*3/uL (ref 0.1–1.0)
NEUTROS ABS: 6.5 10*3/uL (ref 1.7–7.7)
Neutrophils Relative %: 76 %
Platelets: 277 10*3/uL (ref 150–400)
RBC: 3.85 MIL/uL — ABNORMAL LOW (ref 3.87–5.11)
RDW: 23.3 % — ABNORMAL HIGH (ref 11.5–15.5)
WBC: 8.5 10*3/uL (ref 4.0–10.5)

## 2015-08-09 MED ORDER — ONDANSETRON 4 MG PO TBDP
4.0000 mg | ORAL_TABLET | Freq: Once | ORAL | Status: AC
  Administered 2015-08-09: 4 mg via ORAL
  Filled 2015-08-09: qty 1

## 2015-08-09 MED ORDER — POTASSIUM CHLORIDE CRYS ER 20 MEQ PO TBCR
40.0000 meq | EXTENDED_RELEASE_TABLET | Freq: Once | ORAL | Status: AC
  Administered 2015-08-09: 40 meq via ORAL
  Filled 2015-08-09: qty 2

## 2015-08-09 MED ORDER — ONDANSETRON HCL 4 MG PO TABS: 4.0000 mg | ORAL_TABLET | Freq: Four times a day (QID) | ORAL | Status: DC

## 2015-08-09 MED ORDER — IBUPROFEN 800 MG PO TABS
800.0000 mg | ORAL_TABLET | Freq: Once | ORAL | Status: AC
  Administered 2015-08-09: 800 mg via ORAL
  Filled 2015-08-09: qty 2

## 2015-08-09 MED ORDER — IBUPROFEN 800 MG PO TABS
800.0000 mg | ORAL_TABLET | Freq: Once | ORAL | Status: AC
  Administered 2015-08-09: 800 mg via ORAL
  Filled 2015-08-09: qty 1

## 2015-08-09 NOTE — Discharge Instructions (Signed)
Return to the ED with any concerns including vomiting and not able to keep down liquids, fainting, chest pain, abdominal pain, decreased level of alertness/lethargy, or any other alarming symptoms

## 2015-08-09 NOTE — ED Notes (Signed)
Pt threw up ibuprofen, er md made aware

## 2015-08-09 NOTE — ED Notes (Signed)
Pt able to hold down fluids 

## 2015-08-09 NOTE — ED Provider Notes (Signed)
CSN: [id number]     Arrival date & time 08/09/15  1914 History   First MD Initiated Contact with Patient 08/09/15 1951     Chief Complaint  Patient presents with  . Irregular Heart Beat     (Consider location/radiation/quality/duration/timing/severity/associated sxs/prior Treatment) HPI  Pt presenting with c/o palpitations, she states that earlier today she felt her heart racing intermittently.  No chest pain, no fainting, no difficulty breathing.  She also states that she has been having a headache.  Later in the day developed nausea and vomiting.   Emesis nonbloody and nonbilious.  No abdominal pain.  Denies dysuria.  No fever/chills.  No changes in vision or speech.  There are no other associated systemic symptoms, there are no other alleviating or modifying factors.   Past Medical History  Diagnosis Date  . Stroke (HCC)     assoc with short term memory loss and right peripheral vision loss; age 35   . Blood transfusion   . Anxiety   . Coronary artery disease     a. apical LAD infarction '00. b. NSTEMI s/p BMS to prox LAD '09. c. Cath 01/2015: stable LAD stent, otherwise minimal nonobstructive CAD.  Marland Kitchen Anemia   . Antithrombin III deficiency (HCC)     ?pt not sure if true diagnosis  . Tobacco abuse   . Myocardial infarction (HCC)   . Apical mural thrombus (HCC)   . TIA (transient ischemic attack) 2010  . Hypertension   . Hyperlipidemia   . GERD (gastroesophageal reflux disease)   . Noncompliance with medication regimen     a. h/o noncompliance with med regimen (previous running out of Coumadin).   Past Surgical History  Procedure Laterality Date  . Tubal ligation    . Cardiac catheterization    . Esophagogastroduodenoscopy N/A 06/09/2012    Procedure: ESOPHAGOGASTRODUODENOSCOPY (EGD);  Surgeon: Theda Belfast, MD;  Location: [facility];  Service: Endoscopy;  Laterality: N/A;  . Coronary angioplasty    . Esophagogastroduodenoscopy N/A 08/02/2013    Procedure:  ESOPHAGOGASTRODUODENOSCOPY (EGD);  Surgeon: Meryl Dare, MD;  Location: [facility] ENDOSCOPY;  Service: Endoscopy;  Laterality: N/A;  . Left heart catheterization with coronary angiogram N/A 12/24/2011    Procedure: LEFT HEART CATHETERIZATION WITH CORONARY ANGIOGRAM;  Surgeon: Kathleene Hazel, MD;  Location: [facility] CATH LAB;  Service: Cardiovascular;  Laterality: N/A;  . Colonoscopy N/A 03/29/2014    Procedure: COLONOSCOPY;  Surgeon: Louis Meckel, MD;  Location: [facility] ENDOSCOPY;  Service: Endoscopy;  Laterality: N/A;  . Cardiac catheterization N/A 01/29/2015    Procedure: Left Heart Cath and Coronary Angiography;  Surgeon: Peter M Swaziland, MD;  Location: [facility] INVASIVE CV LAB;  Service: Cardiovascular;  Laterality: N/A;   Family History  Problem Relation Age of Onset  . Heart disease Brother     arrhythmia; died  . Breast cancer Maternal Aunt    Social History  Substance Use Topics  . Smoking status: Former Smoker -- 0.20 packs/day for 1 years    Types: Cigarettes    Quit date: 11/26/2014  . Smokeless tobacco: Never Used     Comment: STOPPED SMOKING IN SEPTEMBER 2016  . Alcohol Use: 0.0 oz/week    0 Cans of beer per week     Comment: Social drinker   OB History    No data available     Review of Systems  ROS reviewed and all otherwise negative except for mentioned in HPI    Allergies  Review of patient's allergies indicates  no known allergies.  Home Medications   Prior to Admission medications   Medication Sig Start Date End Date Taking? Authorizing Provider  hydrocortisone-pramoxine [facility]) rectal foam Place 1 applicator rectally 2 (two) times daily. Patient taking differently: Place 1 applicator rectally daily as needed for hemorrhoids.  06/15/14  Yes Devoria Albe, MD  nitroGLYCERIN (NITROSTAT) 0.4 MG SL tablet Place 1 tablet (0.4 mg total) under the tongue every 5 (five) minutes as needed for chest pain (up to 3 doses). 10/14/13  Yes Beatrice Lecher, PA-C  acetaminophen  (TYLENOL) 500 MG tablet Take 1,000 mg by mouth every 6 (six) hours as needed for headache (pain).    Historical Provider, MD  ALPRAZolam Prudy Feeler) 0.25 MG tablet Take 1 tablet (0.25 mg total) by mouth at bedtime as needed for anxiety or sleep. 03/28/15   Ellsworth Lennox, PA  amLODipine (NORVASC) 5 MG tablet Take 5 mg by mouth daily.    Historical Provider, MD  atorvastatin (LIPITOR) 40 MG tablet Take 1 tablet (40 mg total) by mouth daily. 02/01/15   Darreld Mclean, MD  carvedilol (COREG) 25 MG tablet Take 1 tablet (25 mg total) by mouth 2 (two) times daily with a meal. 03/28/15   Ellsworth Lennox, PA  clopidogrel (PLAVIX) 75 MG tablet Take 1 tablet (75 mg total) by mouth daily. 03/28/15   Ellsworth Lennox, PA  enoxaparin (LOVENOX) 120 MG/0.8ML injection Inject 0.73 mLs (110 mg total) into the skin at bedtime. Until instructed to stop by [facility]. 04/24/15   Dayna N Dunn, PA-C  HYDROcodone-acetaminophen (NORCO/VICODIN) 5-325 MG tablet Take 1-2 tablets by mouth every 6 (six) hours as needed for moderate pain. 07/21/15   Cathren Laine, MD  hydrocortisone (ANUSOL-HC) 2.5 % rectal cream Apply rectally 2 times daily prn 07/21/15   Cathren Laine, MD  pantoprazole (PROTONIX) 40 MG tablet Take 1 tablet (40 mg total) by mouth daily. 02/01/15   Darreld Mclean, MD  rivaroxaban (XARELTO) 20 MG TABS tablet Take 1 tablet (20 mg total) by mouth daily with supper. 06/08/15   Abelino Derrick, PA-C  sertraline (ZOLOFT) 100 MG tablet Take 100 mg by mouth daily.    Historical Provider, MD  sucralfate (CARAFATE) 1 GM/10ML suspension Take 10 mLs (1 g total) by mouth 4 (four) times daily -  with meals and at bedtime. Patient taking differently: Take 1 g by mouth daily.  12/22/14   Darreld Mclean, MD   BP 135/101 mmHg  Pulse 75  Temp(Src) 99 F (37.2 C) (Oral)  Resp 12  Ht  (1.676 m)  Wt 74.844 kg  BMI 26.64 kg/m2  SpO2 100%  Vitals reviewed Physical Exam  Physical Examination: General appearance - alert, well  appearing, and in no distress Mental status - alert, oriented to person, place, and time Eyes - no conjunctival injection, no scleral icterus Mouth - mucous membranes moist, pharynx normal without lesions, poor denitition Neck - supple, no significant adenopathy Chest - clear to auscultation, no wheezes, rales or rhonchi, symmetric air entry Heart - normal rate, regular rhythm, normal S1, S2, no murmurs, rubs, clicks or gallops Abdomen - soft, nontender, nondistended, no masses or organomegaly Neurological - alert, oriented, normal speech Extremities - peripheral pulses normal, no pedal edema, no clubbing or cyanosis Skin - normal coloration and turgor, no rashes  ED Course  Procedures (including critical care time) Labs Review Labs Reviewed  CBC WITH DIFFERENTIAL/PLATELET - Abnormal; Notable for the following:    RBC 3.85 (*)  Hemoglobin 10.7 (*)    HCT 34.4 (*)    RDW 23.3 (*)    All other components within normal limits  COMPREHENSIVE METABOLIC PANEL - Abnormal; Notable for the following:    Potassium 3.4 (*)    Glucose, Bld 106 (*)    All other components within normal limits    Imaging Review No results found. I have personally reviewed and evaluated these images and lab results as part of my medical decision-making.   EKG Interpretation   Date/Time:  Thursday Aug 09 2015 19:20:19 EDT Ventricular Rate:  84 PR Interval:  130 QRS Duration: 72 QT Interval:  374 QTC Calculation: 441 R Axis:   82 Text Interpretation:  Normal sinus rhythm Low voltage QRS Cannot rule out  Anteroseptal infarct , age undetermined Abnormal ECG No significant change  since last tracing Confirmed by [facility]  MD, Charlena Haub [phone]) on 08/09/2015  8:20:16 PM      MDM   Final diagnoses:  Palpitations  Hypokalemia  Non-intractable vomiting without nausea, vomiting of unspecified type    Pt presentign with c/o rapid heart rate intermittently.  EKg in the ED is NSR at rate of 80.  She also c/o  headache and has had some vomiting today.  nauase improved after zofran, she was able to tolerate ibuprofen for headache and po fluids in the ED.  Mild hypokalemia which was repleted.  Pt given f/u referral to cardiology for palpitations.  Headache not abrupt in onset, normal neuro exam- doubt SAH, meningitis or other emergent cause.  Discharged with strict return precautions.  Pt agreeable with plan.    Jerelyn Scott, MD 08/11/15 [phone]

## 2015-08-09 NOTE — ED Notes (Signed)
Pt to ED with c/o irreg. Heartbeat.  St's it comes and goes.   Also c/o being hypertensive with headache and dizziness.  St's she has   Nausea with vomiting, st's has vomited several times today.  Pt alert and oriented x's 3

## 2015-11-20 ENCOUNTER — Emergency Department (HOSPITAL_COMMUNITY)
Admission: EM | Admit: 2015-11-20 | Discharge: 2015-11-21 | Disposition: A | Discharge status: Home / Self Care | Payer: Medicaid Other | Attending: Emergency Medicine | Admitting: Emergency Medicine

## 2015-11-20 ENCOUNTER — Encounter (HOSPITAL_COMMUNITY): Payer: Self-pay | Admitting: *Deleted

## 2015-11-20 ENCOUNTER — Emergency Department (HOSPITAL_COMMUNITY): Payer: Medicaid Other

## 2015-11-20 DIAGNOSIS — Z79899 Other long term (current) drug therapy: Secondary | ICD-10-CM | POA: Diagnosis not present

## 2015-11-20 DIAGNOSIS — R071 Chest pain on breathing: Secondary | ICD-10-CM | POA: Diagnosis not present

## 2015-11-20 DIAGNOSIS — I2510 Atherosclerotic heart disease of native coronary artery without angina pectoris: Secondary | ICD-10-CM | POA: Insufficient documentation

## 2015-11-20 DIAGNOSIS — Z7901 Long term (current) use of anticoagulants: Secondary | ICD-10-CM | POA: Diagnosis not present

## 2015-11-20 DIAGNOSIS — Z87891 Personal history of nicotine dependence: Secondary | ICD-10-CM | POA: Diagnosis not present

## 2015-11-20 DIAGNOSIS — R079 Chest pain, unspecified: Secondary | ICD-10-CM

## 2015-11-20 DIAGNOSIS — I10 Essential (primary) hypertension: Secondary | ICD-10-CM | POA: Insufficient documentation

## 2015-11-20 DIAGNOSIS — Z955 Presence of coronary angioplasty implant and graft: Secondary | ICD-10-CM | POA: Diagnosis not present

## 2015-11-20 DIAGNOSIS — M79605 Pain in left leg: Secondary | ICD-10-CM | POA: Diagnosis not present

## 2015-11-20 DIAGNOSIS — Z8673 Personal history of transient ischemic attack (TIA), and cerebral infarction without residual deficits: Secondary | ICD-10-CM | POA: Insufficient documentation

## 2015-11-20 DIAGNOSIS — K047 Periapical abscess without sinus: Secondary | ICD-10-CM | POA: Insufficient documentation

## 2015-11-20 DIAGNOSIS — I252 Old myocardial infarction: Secondary | ICD-10-CM | POA: Diagnosis not present

## 2015-11-20 LAB — BASIC METABOLIC PANEL
Anion gap: 13 (ref 5–15)
CHLORIDE: 103 mmol/L (ref 101–111)
CO2: 22 mmol/L (ref 22–32)
CREATININE: 0.77 mg/dL (ref 0.44–1.00)
Calcium: 9.1 mg/dL (ref 8.9–10.3)
Glucose, Bld: 118 mg/dL — ABNORMAL HIGH (ref 65–99)
Potassium: 3.3 mmol/L — ABNORMAL LOW (ref 3.5–5.1)
SODIUM: 138 mmol/L (ref 135–145)

## 2015-11-20 LAB — I-STAT TROPONIN, ED
TROPONIN I, POC: 0 ng/mL (ref 0.00–0.08)
TROPONIN I, POC: 0 ng/mL (ref 0.00–0.08)

## 2015-11-20 LAB — CBC
HCT: 37.9 % (ref 36.0–46.0)
Hemoglobin: 12 g/dL (ref 12.0–15.0)
MCH: 29.6 pg (ref 26.0–34.0)
MCHC: 31.7 g/dL (ref 30.0–36.0)
MCV: 93.6 fL (ref 78.0–100.0)
PLATELETS: 263 10*3/uL (ref 150–400)
RBC: 4.05 MIL/uL (ref 3.87–5.11)
RDW: 16.4 % — AB (ref 11.5–15.5)
WBC: 11.6 10*3/uL — AB (ref 4.0–10.5)

## 2015-11-20 MED ORDER — MORPHINE SULFATE (PF) 4 MG/ML IV SOLN
4.0000 mg | Freq: Once | INTRAVENOUS | Status: AC
  Administered 2015-11-20: 4 mg via INTRAVENOUS
  Filled 2015-11-20: qty 1

## 2015-11-20 MED ORDER — IOPAMIDOL (ISOVUE-370) INJECTION 76%
INTRAVENOUS | Status: AC
  Administered 2015-11-21: 100 mL
  Filled 2015-11-20: qty 100

## 2015-11-20 NOTE — ED Provider Notes (Signed)
[facility]-EMERGENCY DEPT Provider Note   CSN: [id number] Arrival date & time: 11/20/15  1639     History   Chief Complaint Chief Complaint  Patient presents with  . Chest Pain  . Leg Pain  . Shortness of Breath    HPI Lindsey [name] is a 45 y.o. female.  Lindsey [name] is a 45 y.o. 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 Hydrocodone tired with little relief of pain. Today around 2 PM she started developing a squeezing central chest pain associated with difficulty breathing described as "having to put a lot more effort in to getting a breath." She took a BC powder at that time with little relief. She then took a nitroglycerin at 3:45 pm, again with no relief. She states that she has been out of her Xarelto for the last 2 days because she has not gone to get her prescription filled. Denies leg swelling, palpitations, back pain, abdominal pain, n/v, diaphoresis.   Of note, patient is also complaining of right-sided facial swelling and upper dental pain that developed today. No fevers or difficulty swallowing.  The history is provided by the patient and medical records. No language interpreter was used.  Chest Pain   Associated symptoms include leg pain and shortness of breath. Pertinent negatives include no abdominal pain, no diaphoresis, no dizziness, no fever, no headaches, no nausea, no palpitations and no vomiting.  Leg Pain    Shortness of Breath  Associated symptoms include chest pain and leg pain. Pertinent negatives include no fever, no headaches, no vomiting, no abdominal pain and no leg swelling.    Past Medical History:  Diagnosis Date  . Anemia   . Antithrombin III deficiency (HCC)    ?pt not sure if true diagnosis  . Anxiety   . Apical mural thrombus (HCC)   . Blood  transfusion   . Coronary artery disease    a. apical LAD infarction '00. b. NSTEMI s/p BMS to prox LAD '09. c. Cath 01/2015: stable LAD stent, otherwise minimal nonobstructive CAD.  Marland Kitchen GERD (gastroesophageal reflux disease)   . Hyperlipidemia   . Hypertension   . Myocardial infarction (HCC)   . Noncompliance with medication regimen    a. h/o noncompliance with med regimen (previous running out of Coumadin).  . Stroke (HCC)    assoc with short term memory loss and right peripheral vision loss; age 54   . TIA (transient ischemic attack) 2010  . Tobacco abuse     Patient Active Problem List   Diagnosis Date Noted  . GERD (gastroesophageal reflux disease) 04/24/2015  . Hypertensive urgency 04/23/2015  . Chest pain, unspecified   . Right leg swelling 03/07/2015  . Preventative health care 02/21/2015  . Esophageal reflux   . NSTEMI (non-ST elevated myocardial infarction) (HCC) 01/28/2015  . Nausea without vomiting 12/22/2014  . Dysuria 12/21/2014  . Diarrhea 09/26/2014  . Difficulty swallowing pills 09/26/2014  . Hemorrhoids 06/16/2014  . Elevated AST (SGOT) 03/27/2014  . Lumbar herniated disc 03/27/2014  . TIA (transient ischemic attack) 02/19/2014  . History of ischemic left PCA stroke 02/19/2014  . Palpitations 01/27/2014  . Depression 08/15/2013  . Long term current use of anticoagulant therapy 08/10/2013  . Gastritis and duodenitis 08/03/2013  . Diverticulosis 08/03/2013  . Fatty liver 08/03/2013  . Esophagitis  08/03/2013  . Hiatal hernia 08/03/2013  . Apical mural thrombus (HCC) 08/02/2013  . GI bleeding 06/04/2012  . Antithrombin III deficiency (HCC) 06/04/2012  . Paresthesias 06/29/2011  . Hyperlipemia 06/07/2009  . Essential hypertension 05/17/2009  . OBESITY-MORBID (>100') 03/13/2008  . CAD S/P  BMS to proximal LAD 2009-patent 01/29/15 06/28/2007    Past Surgical History:  Procedure Laterality Date  . CARDIAC CATHETERIZATION    . CARDIAC CATHETERIZATION N/A  01/29/2015   Procedure: Left Heart Cath and Coronary Angiography;  Surgeon: Peter M Swaziland, MD;  Location: [facility] INVASIVE CV LAB;  Service: Cardiovascular;  Laterality: N/A;  . COLONOSCOPY N/A 03/29/2014   Procedure: COLONOSCOPY;  Surgeon: Louis Meckel, MD;  Location: [facility] ENDOSCOPY;  Service: Endoscopy;  Laterality: N/A;  . CORONARY ANGIOPLASTY    . ESOPHAGOGASTRODUODENOSCOPY N/A 06/09/2012   Procedure: ESOPHAGOGASTRODUODENOSCOPY (EGD);  Surgeon: Theda Belfast, MD;  Location: [facility] ENDOSCOPY;  Service: Endoscopy;  Laterality: N/A;  . ESOPHAGOGASTRODUODENOSCOPY N/A 08/02/2013   Procedure: ESOPHAGOGASTRODUODENOSCOPY (EGD);  Surgeon: Meryl Dare, MD;  Location: [facility] ENDOSCOPY;  Service: Endoscopy;  Laterality: N/A;  . LEFT HEART CATHETERIZATION WITH CORONARY ANGIOGRAM N/A 12/24/2011   Procedure: LEFT HEART CATHETERIZATION WITH CORONARY ANGIOGRAM;  Surgeon: Kathleene Hazel, MD;  Location: [facility] CATH LAB;  Service: Cardiovascular;  Laterality: N/A;  . TUBAL LIGATION      OB History    No data available       Home Medications    Prior to Admission medications   Medication Sig Start Date End Date Taking? Authorizing Provider  acetaminophen (TYLENOL) 500 MG tablet Take 1,000 mg by mouth every 6 (six) hours as needed for headache (pain).   Yes Historical Provider, MD  ALPRAZolam (XANAX) 0.25 MG tablet Take 1 tablet (0.25 mg total) by mouth at bedtime as needed for anxiety or sleep. 03/28/15  Yes Ellsworth Lennox, PA  amLODipine (NORVASC) 5 MG tablet Take 5 mg by mouth daily.   Yes Historical Provider, MD  atorvastatin (LIPITOR) 40 MG tablet Take 1 tablet (40 mg total) by mouth daily. 02/01/15  Yes Darreld Mclean, MD  carvedilol (COREG) 25 MG tablet Take 1 tablet (25 mg total) by mouth 2 (two) times daily with a meal. 03/28/15  Yes Ellsworth Lennox, PA  clopidogrel (PLAVIX) 75 MG tablet Take 1 tablet (75 mg total) by mouth daily. 03/28/15  Yes Ellsworth Lennox, PA  HYDROcodone-acetaminophen  (NORCO/VICODIN) 5-325 MG tablet Take 1-2 tablets by mouth every 6 (six) hours as needed for moderate pain. 07/21/15  Yes Cathren Laine, MD  hydrocortisone (ANUSOL-HC) 2.5 % rectal cream Apply rectally 2 times daily prn Patient taking differently: Place 1 application rectally 2 (two) times daily as needed for hemorrhoids.  07/21/15  Yes Cathren Laine, MD  hydrocortisone-pramoxine [facility]) rectal foam Place 1 applicator rectally 2 (two) times daily. Patient taking differently: Place 1 applicator rectally daily as needed for hemorrhoids.  06/15/14  Yes Devoria Albe, MD  nitroGLYCERIN (NITROSTAT) 0.4 MG SL tablet Place 1 tablet (0.4 mg total) under the tongue every 5 (five) minutes as needed for chest pain (up to 3 doses). 10/14/13  Yes Scott T Alben Spittle, PA-C  ondansetron (ZOFRAN) 4 MG tablet Take 1 tablet (4 mg total) by mouth every 6 (six) hours. Patient taking differently: Take 4 mg by mouth every 6 (six) hours as needed for nausea or vomiting.  08/09/15  Yes Jerelyn Scott, MD  pantoprazole (PROTONIX) 40 MG tablet Take 1 tablet (40 mg total) by mouth daily. 02/01/15  Yes Darreld Mclean, MD  rivaroxaban (XARELTO) 20 MG TABS tablet Take 1 tablet (20 mg total) by mouth daily with supper. 06/08/15  Yes Luke K Kilroy, PA-C  sertraline (ZOLOFT) 100 MG tablet Take 100 mg by mouth daily.   Yes Historical Provider, MD  penicillin v potassium (VEETID) 500 MG tablet Take 1 tablet (500 mg total) by mouth 4 (four) times daily. 11/21/15 11/28/15  Chase Picket Baily Hovanec, PA-C    Family History Family History  Problem Relation Age of Onset  . Heart disease Brother     arrhythmia; died  . Breast cancer Maternal Aunt     Social History Social History  Substance Use Topics  . Smoking status: Former Smoker    Packs/day: 0.20    Years: 1.00    Types: Cigarettes    Quit date: 11/26/2014  . Smokeless tobacco: Never Used     Comment: STOPPED SMOKING IN SEPTEMBER 2016  . Alcohol use 0.0 oz/week     Comment: Social drinker      Allergies   Review of patient's allergies indicates no known allergies.   Review of Systems Review of Systems  Constitutional: Negative for diaphoresis and fever.  HENT: Positive for dental problem. Negative for trouble swallowing and voice change.   Eyes: Negative for visual disturbance.  Respiratory: Positive for shortness of breath.   Cardiovascular: Positive for chest pain. Negative for palpitations and leg swelling.  Gastrointestinal: Negative for abdominal pain, nausea and vomiting.  Genitourinary: Negative for dysuria.  Musculoskeletal: Positive for myalgias.  Skin: Negative for color change.  Neurological: Negative for dizziness and headaches.     Physical Exam Updated Vital Signs BP 158/99   Pulse 92   Temp 99.2 F (37.3 C) (Oral)   Resp 22   SpO2 99%   Physical Exam  Constitutional: She is oriented to person, place, and time. She appears well-developed and well-nourished. No distress.  HENT:  Right facial swelling. TTP along gum just above right premolars, midline uvula, no trismus, oropharynx moist and clear, no oropharyngeal erythema or edema, neck supple and no tenderness.  Cardiovascular: Normal rate, regular rhythm and normal heart sounds.  Exam reveals no gallop and no friction rub.   No murmur heard. 2+ distal pulses in all four extremities.   Pulmonary/Chest: Effort normal and breath sounds normal. No respiratory distress. She has no wheezes. She has no rales. She exhibits no tenderness.  Abdominal: Soft. She exhibits no distension. There is no tenderness.  Musculoskeletal: She exhibits no edema.       Legs: Left leg with tenderness to palpation as depicted in image. No unilateral swelling appreciated. Full ROM. 5/5 muscle strength in bilateral LE's.   Neurological: She is alert and oriented to person, place, and time.  Bilateral lower extremities neurovascularly intact.   Skin: Skin is warm and dry.  Nursing note and vitals reviewed.    ED  Treatments / Results  Labs (all labs ordered are listed, but only abnormal results are displayed) Labs Reviewed  BASIC METABOLIC PANEL - Abnormal; Notable for the following:       Result Value   Potassium 3.3 (*)    Glucose, Bld 118 (*)    BUN <5 (*)    All other components within normal limits  CBC - Abnormal; Notable for the following:    WBC 11.6 (*)    RDW 16.4 (*)    All other components within normal limits  I-STAT TROPOININ, ED  Rosezena Sensor, ED  EKG  EKG Interpretation None       Radiology Dg Chest 2 View  Result Date: 11/20/2015 CLINICAL DATA:  Chest pain, body aches, shortness of breath 3 days EXAM: CHEST  2 VIEW COMPARISON:  07/21/2015 FINDINGS: The heart size and mediastinal contours are within normal limits. Both lungs are clear. The visualized skeletal structures are unremarkable. IMPRESSION: No active cardiopulmonary disease. Electronically Signed   By: Elige KoHetal  Patel   On: 11/20/2015 17:39   Ct Angio Chest Pe W Or Wo Contrast  Result Date: 11/21/2015 CLINICAL DATA:  Acute onset of generalized chest pain and shortness of breath. Left leg pain. Initial encounter. EXAM: CT ANGIOGRAPHY CHEST WITH CONTRAST TECHNIQUE: Multidetector CT imaging of the chest was performed using the standard protocol during bolus administration of intravenous contrast. Multiplanar CT image reconstructions and MIPs were obtained to evaluate the vascular anatomy. CONTRAST:  80 mL of Isovue 370 IV contrast COMPARISON:  Chest radiograph performed 11/20/2015, and CTA of the chest performed 12/20/2014 FINDINGS: There is no evidence of pulmonary embolus. Minimal bibasilar atelectasis is noted. The lungs are otherwise clear. There is no evidence of significant focal consolidation, pleural effusion or pneumothorax. No masses are identified; no abnormal focal contrast enhancement is seen. The mediastinum is unremarkable in appearance. No mediastinal lymphadenopathy is seen. No pericardial effusion is  identified. The great vessels are grossly unremarkable. A coronary artery stent is noted. No axillary lymphadenopathy is seen. The visualized portions of the thyroid gland are unremarkable in appearance. The visualized portions of the liver and spleen are unremarkable. The visualized portions of the pancreas, gallbladder, stomach, adrenal glands and kidneys are within normal limits. No acute osseous abnormalities are seen. Review of the MIP images confirms the above findings. IMPRESSION: 1. No evidence of pulmonary embolus. 2. Minimal bibasilar atelectasis noted.  Lungs otherwise clear. Electronically Signed   By: Roanna RaiderJeffery  Chang M.D.   On: 11/21/2015 00:37    Procedures Procedures (including critical care time)  Medications Ordered in ED Medications  morphine 4 MG/ML injection 4 mg (4 mg Intravenous Given 11/20/15 2319)  iopamidol (ISOVUE-370) 76 % injection (100 mLs  Contrast Given 11/21/15 0006)  lidocaine (XYLOCAINE) 2 % viscous mouth solution 15 mL (15 mLs Mouth/Throat Given 11/21/15 0109)  rivaroxaban (XARELTO) tablet 20 mg (20 mg Oral Given 11/21/15 0146)  penicillin v potassium (VEETID) tablet 500 mg (500 mg Oral Given 11/21/15 0107)  HYDROcodone-acetaminophen (NORCO/VICODIN) 5-325 MG per tablet 1 tablet (1 tablet Oral Given 11/21/15 0107)     Initial Impression / Assessment and Plan / ED Course  I have reviewed the triage vital signs and the nursing notes.  Pertinent labs & imaging results that were available during my care of the patient were reviewed by me and considered in my medical decision making (see chart for details).  Clinical Course   Lindsey [name] is a 45 y.o. female with PMH of antithrombin III deficiency disorder on Xarelto (has not taken the last two days) who presents to ED for left leg pain x 3 days and chest pain/shortness of breath that began today. She also is complaining of right upper dental pain.   CT angio with no PE. Trop negative x 2. CXR unremarkable. All labs  reviewed.  Home xarelto given in ED. She has refills available at the pharmacy and we spoke at length about the importance of taking this medication every day as directed. PCP follow up strongly recommended. PenVK rx for dental infection. Follow up with dentist strongly encouraged  as well. Reasons to return to ED discussed and all questions answered.    Patient discussed with Dr. Patria Mane who agrees with treatment plan.    Final Clinical Impressions(s) / ED Diagnoses   Final diagnoses:  Chest pain  Left leg pain  Dental infection    New Prescriptions Discharge Medication List as of 11/21/2015  1:37 AM    START taking these medications   Details  penicillin v potassium (VEETID) 500 MG tablet Take 1 tablet (500 mg total) by mouth 4 (four) times daily., Starting Wed 11/21/2015, Until Wed 11/28/2015, Print         [facility] Parisa Pinela, PA-C 11/21/15 0425    Azalia Bilis, MD 11/21/15 [phone]

## 2015-11-20 NOTE — ED Triage Notes (Signed)
Pt c/o CP, shortness of breath, and left leg pain. Pt states she has a clotting disorder, hx of stroke and MI. Leg pain started 3 days ago. Pt took nitro prior to arrival with some relief.

## 2015-11-21 ENCOUNTER — Emergency Department (HOSPITAL_COMMUNITY): Payer: Medicaid Other

## 2015-11-21 ENCOUNTER — Encounter (HOSPITAL_COMMUNITY): Payer: Self-pay | Admitting: Radiology

## 2015-11-21 MED ORDER — HYDROCODONE-ACETAMINOPHEN 5-325 MG PO TABS
1.0000 | ORAL_TABLET | Freq: Once | ORAL | Status: AC
  Administered 2015-11-21: 1 via ORAL
  Filled 2015-11-21: qty 1

## 2015-11-21 MED ORDER — RIVAROXABAN 20 MG PO TABS
20.0000 mg | ORAL_TABLET | Freq: Once | ORAL | Status: AC
  Administered 2015-11-21: 20 mg via ORAL
  Filled 2015-11-21: qty 1

## 2015-11-21 MED ORDER — PENICILLIN V POTASSIUM 500 MG PO TABS: 500.0000 mg | ORAL_TABLET | Freq: Four times a day (QID) | ORAL | 0 refills | Status: AC

## 2015-11-21 MED ORDER — LIDOCAINE VISCOUS 2 % MT SOLN
15.0000 mL | Freq: Once | OROMUCOSAL | Status: AC
  Administered 2015-11-21: 15 mL via OROMUCOSAL
  Filled 2015-11-21: qty 15

## 2015-11-21 MED ORDER — PENICILLIN V POTASSIUM 250 MG PO TABS
500.0000 mg | ORAL_TABLET | Freq: Once | ORAL | Status: AC
  Administered 2015-11-21: 500 mg via ORAL
  Filled 2015-11-21: qty 2

## 2015-11-21 NOTE — Discharge Instructions (Signed)
It is very important to take your Xarelto every day as directed. Please follow-up with your primary care provider in regards to today's visit at the next available appointment.  You have a dental injury. It is very important that you get evaluated by a dentist as soon as possible. Call tomorrow to schedule an appointment. Take your full course of antibiotics. Read the instructions below.  Eat a soft or liquid diet and rinse your mouth out after meals with warm water. You should see a dentist or return here at once if you have increased swelling, increased pain or uncontrolled bleeding from the site of your injury.  SEEK MEDICAL CARE IF:  You have difficulty breathing or chest pain You have swelling around your tooth, in your face or neck.  You have bleeding which starts, continues, or gets worse.  You have a fever >101 If you are unable to open your mouth

## 2016-03-10 ENCOUNTER — Emergency Department (HOSPITAL_COMMUNITY): Payer: Medicaid Other

## 2016-03-10 ENCOUNTER — Encounter (HOSPITAL_COMMUNITY): Payer: Self-pay | Admitting: Vascular Surgery

## 2016-03-10 ENCOUNTER — Emergency Department (HOSPITAL_COMMUNITY)
Admission: EM | Admit: 2016-03-10 | Discharge: 2016-03-10 | Disposition: A | Discharge status: Home / Self Care | Payer: Medicaid Other | Attending: Emergency Medicine | Admitting: Emergency Medicine

## 2016-03-10 DIAGNOSIS — R110 Nausea: Secondary | ICD-10-CM | POA: Diagnosis not present

## 2016-03-10 DIAGNOSIS — Z79899 Other long term (current) drug therapy: Secondary | ICD-10-CM | POA: Insufficient documentation

## 2016-03-10 DIAGNOSIS — I252 Old myocardial infarction: Secondary | ICD-10-CM | POA: Insufficient documentation

## 2016-03-10 DIAGNOSIS — I2510 Atherosclerotic heart disease of native coronary artery without angina pectoris: Secondary | ICD-10-CM | POA: Insufficient documentation

## 2016-03-10 DIAGNOSIS — I10 Essential (primary) hypertension: Secondary | ICD-10-CM | POA: Diagnosis not present

## 2016-03-10 DIAGNOSIS — Z955 Presence of coronary angioplasty implant and graft: Secondary | ICD-10-CM | POA: Insufficient documentation

## 2016-03-10 DIAGNOSIS — Z7901 Long term (current) use of anticoagulants: Secondary | ICD-10-CM | POA: Diagnosis not present

## 2016-03-10 DIAGNOSIS — Z87891 Personal history of nicotine dependence: Secondary | ICD-10-CM | POA: Diagnosis not present

## 2016-03-10 DIAGNOSIS — Z8673 Personal history of transient ischemic attack (TIA), and cerebral infarction without residual deficits: Secondary | ICD-10-CM | POA: Diagnosis not present

## 2016-03-10 DIAGNOSIS — R200 Anesthesia of skin: Secondary | ICD-10-CM | POA: Diagnosis present

## 2016-03-10 DIAGNOSIS — R202 Paresthesia of skin: Secondary | ICD-10-CM

## 2016-03-10 LAB — COMPREHENSIVE METABOLIC PANEL
ALT: 51 U/L (ref 14–54)
AST: 120 U/L — AB (ref 15–41)
Albumin: 3.8 g/dL (ref 3.5–5.0)
Alkaline Phosphatase: 94 U/L (ref 38–126)
Anion gap: 11 (ref 5–15)
BUN: 8 mg/dL (ref 6–20)
CO2: 25 mmol/L (ref 22–32)
CREATININE: 0.71 mg/dL (ref 0.44–1.00)
Calcium: 8.9 mg/dL (ref 8.9–10.3)
Chloride: 102 mmol/L (ref 101–111)
GFR calc Af Amer: >60 mL/min (ref >60)
Glucose, Bld: 98 mg/dL (ref 65–99)
POTASSIUM: 4.4 mmol/L (ref 3.5–5.1)
Sodium: 138 mmol/L (ref 135–145)
Total Bilirubin: 0.9 mg/dL (ref 0.3–1.2)
Total Protein: 7.1 g/dL (ref 6.5–8.1)

## 2016-03-10 LAB — CBC
HEMATOCRIT: 34.2 % — AB (ref 36.0–46.0)
HEMOGLOBIN: 11.5 g/dL — AB (ref 12.0–15.0)
MCH: 30.7 pg (ref 26.0–34.0)
MCHC: 33.6 g/dL (ref 30.0–36.0)
MCV: 91.2 fL (ref 78.0–100.0)
Platelets: 196 10*3/uL (ref 150–400)
RBC: 3.75 MIL/uL — ABNORMAL LOW (ref 3.87–5.11)
RDW: 17.2 % — ABNORMAL HIGH (ref 11.5–15.5)
WBC: 5 10*3/uL (ref 4.0–10.5)

## 2016-03-10 LAB — PROTIME-INR
INR: 1.08
Prothrombin Time: 14 seconds (ref 11.4–15.2)

## 2016-03-10 LAB — DIFFERENTIAL
BASOS ABS: 0.1 10*3/uL (ref 0.0–0.1)
Basophils Relative: 1 %
EOS ABS: 0 10*3/uL (ref 0.0–0.7)
Eosinophils Relative: 0 %
LYMPHS ABS: 0.7 10*3/uL (ref 0.7–4.0)
Lymphocytes Relative: 14 %
MONOS PCT: 8 %
Monocytes Absolute: 0.4 10*3/uL (ref 0.1–1.0)
Neutro Abs: 3.9 10*3/uL (ref 1.7–7.7)
Neutrophils Relative %: 77 %

## 2016-03-10 LAB — I-STAT TROPONIN, ED: TROPONIN I, POC: 0 ng/mL (ref 0.00–0.08)

## 2016-03-10 LAB — APTT: APTT: 28 s (ref 24–36)

## 2016-03-10 MED ORDER — ONDANSETRON 8 MG PO TBDP: 8.0000 mg | ORAL_TABLET | Freq: Three times a day (TID) | ORAL | 0 refills | Status: DC | PRN

## 2016-03-10 MED ORDER — ONDANSETRON HCL 4 MG/2ML IJ SOLN
4.0000 mg | Freq: Once | INTRAMUSCULAR | Status: AC
  Administered 2016-03-10: 4 mg via INTRAVENOUS
  Filled 2016-03-10: qty 2

## 2016-03-10 MED ORDER — AMLODIPINE BESYLATE 5 MG PO TABS
5.0000 mg | ORAL_TABLET | Freq: Every day | ORAL | Status: DC
  Administered 2016-03-10: 5 mg via ORAL
  Filled 2016-03-10: qty 1

## 2016-03-10 MED ORDER — CARVEDILOL 12.5 MG PO TABS
25.0000 mg | ORAL_TABLET | Freq: Two times a day (BID) | ORAL | Status: DC
  Administered 2016-03-10: 25 mg via ORAL
  Filled 2016-03-10: qty 2

## 2016-03-10 MED ORDER — PROCHLORPERAZINE EDISYLATE 5 MG/ML IJ SOLN
10.0000 mg | Freq: Once | INTRAMUSCULAR | Status: AC
  Administered 2016-03-10: 10 mg via INTRAVENOUS
  Filled 2016-03-10: qty 2

## 2016-03-10 NOTE — ED Triage Notes (Signed)
Pt reports to the ED via [facility] for eval of bilateral arm numbness x 2 hours. Pt reports her arm numbness resolved at approx 830 am but she has some associated symptoms of nausea, lightheadedness, dizziness, HA, and generally feeling unwell. These symptoms have been constant since yesterday. En route she started having right arm numbness. Arm numbness started to develop again approx 5-10 minutes ago. Pt hypertensive at 170/102 en route, other VSS. Denies any CP. Pt has hx of stroke which resulted in permanent peripheral vision loss and short term memory loss.

## 2016-03-10 NOTE — Discharge Instructions (Signed)
Follow-up with your doctor this week to be rechecked, return to the emergency room immediately if you have any worsening symptoms, difficulty with her speech, weakness or other concerns

## 2016-03-10 NOTE — ED Provider Notes (Signed)
[facility]-EMER[facility]ENCY DEPT Provider Note   CSN: [id number] Arrival date & time: 03/10/16  6962     History   Chief Complaint Chief Complaint  Patient presents with  . Numbness    HPI Lindsey [name] is a 45 y.o. female.  HPI Patient presents to the emergency room with complaints of numbness that initially started last night but got worse again this morning while she was at work. Patient states she had numbness in both of her arms. She also felt lightheaded, nausea and dizzy. She went into the work this morning while she was there she had some worsening symptoms with the numbness involving more on the left side. She denies any difficulty with her speech. She was not having any weakness with dropping objects difficulty with walking. She denies any trouble with her coordination. Patient does have history of prior stroke. At that time she had an aphasia. She is not experiencing any of those symptoms today. Patient's symptoms are improving.  She denies any other complaints currently.  Past Medical History:  Diagnosis Date  . Anemia   . Antithrombin III deficiency (HCC)    ?pt not sure if true diagnosis  . Anxiety   . Apical mural thrombus   . Blood transfusion   . Coronary artery disease    a. apical LAD infarction '00. b. NSTEMI s/p BMS to prox LAD '09. c. Cath 01/2015: stable LAD stent, otherwise minimal nonobstructive CAD.  Marland Kitchen GERD (gastroesophageal reflux disease)   . Hyperlipidemia   . Hypertension   . Myocardial infarction   . Noncompliance with medication regimen    a. h/o noncompliance with med regimen (previous running out of Coumadin).  . Stroke (HCC)    assoc with short term memory loss and right peripheral vision loss; age 35   . TIA (transient ischemic attack) 2010  . Tobacco abuse     Patient Active Problem List   Diagnosis Date Noted  . GERD (gastroesophageal reflux disease) 04/24/2015  . Hypertensive urgency 04/23/2015  . Chest pain, unspecified   . Right leg  swelling 03/07/2015  . Preventative health care 02/21/2015  . Esophageal reflux   . NSTEMI (non-ST elevated myocardial infarction) (HCC) 01/28/2015  . Nausea without vomiting 12/22/2014  . Dysuria 12/21/2014  . Diarrhea 09/26/2014  . Difficulty swallowing pills 09/26/2014  . Hemorrhoids 06/16/2014  . Elevated AST (SGOT) 03/27/2014  . Lumbar herniated disc 03/27/2014  . TIA (transient ischemic attack) 02/19/2014  . History of ischemic left PCA stroke 02/19/2014  . Palpitations 01/27/2014  . Depression 08/15/2013  . Long term current use of anticoagulant therapy 08/10/2013  . Gastritis and duodenitis 08/03/2013  . Diverticulosis 08/03/2013  . Fatty liver 08/03/2013  . Esophagitis 08/03/2013  . Hiatal hernia 08/03/2013  . Apical mural thrombus 08/02/2013  . GI bleeding 06/04/2012  . Antithrombin III deficiency (HCC) 06/04/2012  . Paresthesias 06/29/2011  . Hyperlipemia 06/07/2009  . Essential hypertension 05/17/2009  . OBESITY-MORBID (>100') 03/13/2008  . CAD S/P  BMS to proximal LAD 2009-patent 01/29/15 06/28/2007    Past Surgical History:  Procedure Laterality Date  . CARDIAC CATHETERIZATION    . CARDIAC CATHETERIZATION N/A 01/29/2015   Procedure: Left Heart Cath and Coronary Angiography;  Surgeon: Peter M Swaziland, MD;  Location: [facility] INVASIVE CV LAB;  Service: Cardiovascular;  Laterality: N/A;  . COLONOSCOPY N/A 03/29/2014   Procedure: COLONOSCOPY;  Surgeon: Louis Meckel, MD;  Location: [facility] ENDOSCOPY;  Service: Endoscopy;  Laterality: N/A;  . CORONARY ANGIOPLASTY    .  ESOPHAGOGASTRODUODENOSCOPY N/A 06/09/2012   Procedure: ESOPHAGOGASTRODUODENOSCOPY (EGD);  Surgeon: Theda BelfastPatrick D Hung, MD;  [facility];  Service: Endoscopy;  Laterality: N/A;  . ESOPHAGOGASTRODUODENOSCOPY N/A 08/02/2013   Procedure: ESOPHAGOGASTRODUODENOSCOPY (EGD);  Surgeon: Meryl DareMalcolm T Stark, MD;  [facility];  Service: Endoscopy;  Laterality: N/A;  . LEFT HEART CATHETERIZATION WITH CORONARY  ANGIOGRAM N/A 12/24/2011   Procedure: LEFT HEART CATHETERIZATION WITH CORONARY ANGIOGRAM;  Surgeon: Kathleene Hazelhristopher D McAlhany, MD;  [facility];  Service: Cardiovascular;  Laterality: N/A;  . TUBAL LIGATION      OB History    No data available       Home Medications    Prior to Admission medications   Medication Sig Start Date End Date Taking? Authorizing Provider  amLODipine (NORVASC) 5 MG tablet Take 5 mg by mouth daily.   Yes Historical Provider, MD  atorvastatin (LIPITOR) 40 MG tablet Take 1 tablet (40 mg total) by mouth daily. 02/01/15  Yes Darreld McleanVishal Patel, MD  carvedilol (COREG) 25 MG tablet Take 1 tablet (25 mg total) by mouth 2 (two) times daily with a meal. 03/28/15  Yes Ellsworth LennoxBrittany M Strader, PA  clopidogrel (PLAVIX) 75 MG tablet Take 1 tablet (75 mg total) by mouth daily. 03/28/15  Yes Ellsworth LennoxBrittany M Strader, PA  pantoprazole (PROTONIX) 40 MG tablet Take 1 tablet (40 mg total) by mouth daily. 02/01/15  Yes Darreld McleanVishal Patel, MD  rivaroxaban (XARELTO) 20 MG TABS tablet Take 1 tablet (20 mg total) by mouth daily with supper. 06/08/15  Yes Abelino DerrickLuke K Kilroy, PA-C  acetaminophen (TYLENOL) 500 MG tablet Take 1,000 mg by mouth every 6 (six) hours as needed for headache (pain).    Historical Provider, MD  ALPRAZolam Prudy Feeler(XANAX) 0.25 MG tablet Take 1 tablet (0.25 mg total) by mouth at bedtime as needed for anxiety or sleep. 03/28/15   Ellsworth LennoxBrittany M Strader, PA  HYDROcodone-acetaminophen (NORCO/VICODIN) 5-325 MG tablet Take 1-2 tablets by mouth every 6 (six) hours as needed for moderate pain. Patient not taking: Reported on 03/10/2016 07/21/15   Cathren LaineKevin Steinl, MD  hydrocortisone (ANUSOL-HC) 2.5 % rectal cream Apply rectally 2 times daily prn Patient taking differently: Place 1 application rectally 2 (two) times daily as needed for hemorrhoids.  07/21/15   Cathren LaineKevin Steinl, MD  hydrocortisone-pramoxine [facility](PROCTOFOAM-HC) rectal foam Place 1 applicator rectally 2 (two) times daily. Patient taking differently: Place 1  applicator rectally daily as needed for hemorrhoids.  06/15/14   Devoria AlbeIva Marybelle Giraldo, MD  nitroGLYCERIN (NITROSTAT) 0.4 MG SL tablet Place 1 tablet (0.4 mg total) under the tongue every 5 (five) minutes as needed for chest pain (up to 3 doses). 10/14/13   Beatrice LecherScott T Weaver, PA-C  ondansetron (ZOFRAN ODT) 8 MG disintegrating tablet Take 1 tablet (8 mg total) by mouth every 8 (eight) hours as needed for nausea or vomiting. 03/10/16   Linwood DibblesJon Dajahnae Vondra, MD  ondansetron (ZOFRAN) 4 MG tablet Take 1 tablet (4 mg total) by mouth every 6 (six) hours. Patient taking differently: Take 4 mg by mouth every 6 (six) hours as needed for nausea or vomiting.  08/09/15   Jerelyn ScottMartha Linker, MD    Family History Family History  Problem Relation Age of Onset  . Heart disease Brother     arrhythmia; died  . Breast cancer Maternal Aunt     Social History Social History  Substance Use Topics  . Smoking status: Former Smoker    Packs/day: 0.20    Years: 1.00    Types: Cigarettes    Quit date: 11/26/2014  .  Smokeless tobacco: Never Used     Comment: STOPPED SMOKING IN SEPTEMBER 2016  . Alcohol use 0.0 oz/week     Comment: Social drinker     Allergies   Patient has no known allergies.   Review of Systems Review of Systems  All other systems reviewed and are negative.    Physical Exam Updated Vital Signs BP (!) 151/101   Pulse 93   Temp 98.7 F (37.1 C)   Resp 12   SpO2 99%   Physical Exam  Constitutional: She is oriented to person, place, and time. She appears well-developed and well-nourished. No distress.  HENT:  Head: Normocephalic and atraumatic.  Right Ear: External ear normal.  Left Ear: External ear normal.  Mouth/Throat: Oropharynx is clear and moist.  Eyes: Conjunctivae are normal. Right eye exhibits no discharge. Left eye exhibits no discharge. No scleral icterus.  Neck: Neck supple. No tracheal deviation present.  Cardiovascular: Normal rate, regular rhythm and intact distal pulses.   Pulmonary/Chest:  Effort normal and breath sounds normal. No stridor. No respiratory distress. She has no wheezes. She has no rales.  Abdominal: Soft. Bowel sounds are normal. She exhibits no distension. There is no tenderness. There is no rebound and no guarding.  Musculoskeletal: She exhibits no edema or tenderness.  Neurological: She is alert and oriented to person, place, and time. She has normal strength. No cranial nerve deficit (No facial droop, extraocular movements intact, tongue midline ) or sensory deficit. She exhibits normal muscle tone. She displays no seizure activity. Coordination normal.  No pronator drift bilateral upper extrem, able to hold both legs off bed for 5 seconds, sensation intact in all extremities, no visual field cuts, no left or right sided neglect, normal finger-nose exam bilaterally, no nystagmus noted   Skin: Skin is warm and dry. No rash noted.  Psychiatric: She has a normal mood and affect.  Nursing note and vitals reviewed.    ED Treatments / Results  Labs (all labs ordered are listed, but only abnormal results are displayed) Labs Reviewed  CBC - Abnormal; Notable for the following:       Result Value   RBC 3.75 (*)    Hemoglobin 11.5 (*)    HCT 34.2 (*)    RDW 17.2 (*)    All other components within normal limits  COMPREHENSIVE METABOLIC PANEL - Abnormal; Notable for the following:    AST 120 (*)    All other components within normal limits  PROTIME-INR  APTT  DIFFERENTIAL  I-STAT TROPOININ, ED    EKG  EKG Interpretation  Date/Time:  Monday March 10 2016 09:35:20 EST Ventricular Rate:  80 PR Interval:    QRS Duration: 95 QT Interval:  379 QTC Calculation: 438 R Axis:   79 Text Interpretation:  Sinus rhythm Anterior infarct, old No significant change since last tracing Confirmed by Christo Hain  MD-J, Rodrick Payson ([id number]) on 03/10/2016 9:39:31 AM       Radiology Ct Head Wo Contrast  Result Date: 03/10/2016 CLINICAL DATA:  Dizziness with upper extremity  numbness. Previous stroke. EXAM: CT HEAD WITHOUT CONTRAST TECHNIQUE: Contiguous axial images were obtained from the base of the skull through the vertex without intravenous contrast. COMPARISON:  02/19/2014 FINDINGS: Brain: The ventricles, cisterns and other CSF spaces are normal. There is no mass, mass effect, shift of midline structures or acute hemorrhage. Small old inferior left occipital infarct. No evidence of acute infarction. Vascular: Within normal. Skull: Within normal. Sinuses/Orbits: Within normal. Other: None. IMPRESSION: No  acute intracranial findings. Old small inferior left occipital infarct. Electronically Signed   By: Elberta Fortis M.D.   On: 03/10/2016 10:35    Procedures Procedures (including critical care time)  Medications Ordered in ED Medications  amLODipine (NORVASC) tablet 5 mg (5 mg Oral Given 03/10/16 1104)  carvedilol (COREG) tablet 25 mg (25 mg Oral Given 03/10/16 1103)  ondansetron (ZOFRAN) injection 4 mg (4 mg Intravenous Given 03/10/16 1057)  prochlorperazine (COMPAZINE) injection 10 mg (10 mg Intravenous Given 03/10/16 1151)     Initial Impression / Assessment and Plan / ED Course  I have reviewed the triage vital signs and the nursing notes.  Pertinent labs & imaging results that were available during my care of the patient were reviewed by me and considered in my medical decision making (see chart for details).  Clinical Course    Pt presented with bilateral arm numbness, mild headache and nusea.  Neurologic exam is reassuring.  No focal deficits.  Pt does have a concerning history of prior stroke but without focal neuro deficits and the bilateral nature of her symptoms I doubt stroke, TIA.  Migraine is a possibility but she does not have a history of this.  Will dc home with meds for nausea.  Rest.  Follow up with PCP.  Return for any worsening symptoms  Final Clinical Impressions(s) / ED Diagnoses   Final diagnoses:  Paresthesia  Nausea    New  Prescriptions New Prescriptions   ONDANSETRON (ZOFRAN ODT) 8 MG DISINTEGRATING TABLET    Take 1 tablet (8 mg total) by mouth every 8 (eight) hours as needed for nausea or vomiting.     Linwood Dibbles, MD 03/10/16 [phone]

## 2016-03-31 ENCOUNTER — Encounter (HOSPITAL_COMMUNITY): Payer: Self-pay

## 2016-03-31 ENCOUNTER — Emergency Department (HOSPITAL_COMMUNITY)
Admission: EM | Admit: 2016-03-31 | Discharge: 2016-03-31 | Disposition: A | Discharge status: Home / Self Care | Payer: Medicaid Other | Attending: Emergency Medicine | Admitting: Emergency Medicine

## 2016-03-31 DIAGNOSIS — Z8673 Personal history of transient ischemic attack (TIA), and cerebral infarction without residual deficits: Secondary | ICD-10-CM | POA: Insufficient documentation

## 2016-03-31 DIAGNOSIS — D649 Anemia, unspecified: Secondary | ICD-10-CM

## 2016-03-31 DIAGNOSIS — R112 Nausea with vomiting, unspecified: Secondary | ICD-10-CM | POA: Diagnosis not present

## 2016-03-31 DIAGNOSIS — B349 Viral infection, unspecified: Secondary | ICD-10-CM

## 2016-03-31 DIAGNOSIS — R202 Paresthesia of skin: Secondary | ICD-10-CM | POA: Diagnosis not present

## 2016-03-31 DIAGNOSIS — E876 Hypokalemia: Secondary | ICD-10-CM

## 2016-03-31 DIAGNOSIS — R200 Anesthesia of skin: Secondary | ICD-10-CM | POA: Diagnosis present

## 2016-03-31 DIAGNOSIS — G8929 Other chronic pain: Secondary | ICD-10-CM | POA: Insufficient documentation

## 2016-03-31 DIAGNOSIS — R197 Diarrhea, unspecified: Secondary | ICD-10-CM | POA: Diagnosis not present

## 2016-03-31 DIAGNOSIS — Z955 Presence of coronary angioplasty implant and graft: Secondary | ICD-10-CM | POA: Insufficient documentation

## 2016-03-31 DIAGNOSIS — I252 Old myocardial infarction: Secondary | ICD-10-CM | POA: Insufficient documentation

## 2016-03-31 DIAGNOSIS — Z7901 Long term (current) use of anticoagulants: Secondary | ICD-10-CM | POA: Diagnosis not present

## 2016-03-31 DIAGNOSIS — Z87891 Personal history of nicotine dependence: Secondary | ICD-10-CM | POA: Insufficient documentation

## 2016-03-31 DIAGNOSIS — Z79899 Other long term (current) drug therapy: Secondary | ICD-10-CM | POA: Diagnosis not present

## 2016-03-31 DIAGNOSIS — I10 Essential (primary) hypertension: Secondary | ICD-10-CM | POA: Diagnosis not present

## 2016-03-31 DIAGNOSIS — I2510 Atherosclerotic heart disease of native coronary artery without angina pectoris: Secondary | ICD-10-CM | POA: Insufficient documentation

## 2016-03-31 DIAGNOSIS — R42 Dizziness and giddiness: Secondary | ICD-10-CM

## 2016-03-31 LAB — COMPREHENSIVE METABOLIC PANEL
ALBUMIN: 3.4 g/dL — AB (ref 3.5–5.0)
ALT: 27 U/L (ref 14–54)
ANION GAP: 12 (ref 5–15)
AST: 63 U/L — ABNORMAL HIGH (ref 15–41)
Alkaline Phosphatase: 84 U/L (ref 38–126)
BILIRUBIN TOTAL: 0.7 mg/dL (ref 0.3–1.2)
BUN: 8 mg/dL (ref 6–20)
CO2: 24 mmol/L (ref 22–32)
Calcium: 8.8 mg/dL — ABNORMAL LOW (ref 8.9–10.3)
Chloride: 104 mmol/L (ref 101–111)
Creatinine, Ser: 0.77 mg/dL (ref 0.44–1.00)
GFR calc Af Amer: >60 mL/min (ref >60)
GFR calc non Af Amer: >60 mL/min (ref >60)
GLUCOSE: 107 mg/dL — AB (ref 65–99)
POTASSIUM: 3.1 mmol/L — AB (ref 3.5–5.1)
Sodium: 140 mmol/L (ref 135–145)
TOTAL PROTEIN: 6.8 g/dL (ref 6.5–8.1)

## 2016-03-31 LAB — CBC WITH DIFFERENTIAL/PLATELET
BASOS PCT: 1 %
Basophils Absolute: 0.1 10*3/uL (ref 0.0–0.1)
EOS PCT: 1 %
Eosinophils Absolute: 0 10*3/uL (ref 0.0–0.7)
HEMATOCRIT: 35.3 % — AB (ref 36.0–46.0)
Hemoglobin: 11.6 g/dL — ABNORMAL LOW (ref 12.0–15.0)
Lymphocytes Relative: 14 %
Lymphs Abs: 0.8 10*3/uL (ref 0.7–4.0)
MCH: 29.8 pg (ref 26.0–34.0)
MCHC: 32.9 g/dL (ref 30.0–36.0)
MCV: 90.7 fL (ref 78.0–100.0)
MONO ABS: 0.3 10*3/uL (ref 0.1–1.0)
Monocytes Relative: 5 %
NEUTROS ABS: 4.5 10*3/uL (ref 1.7–7.7)
Neutrophils Relative %: 79 %
PLATELETS: 233 10*3/uL (ref 150–400)
RBC: 3.89 MIL/uL (ref 3.87–5.11)
RDW: 17.6 % — ABNORMAL HIGH (ref 11.5–15.5)
WBC: 5.8 10*3/uL (ref 4.0–10.5)

## 2016-03-31 LAB — URINALYSIS, ROUTINE W REFLEX MICROSCOPIC
Bilirubin Urine: NEGATIVE
Glucose, UA: NEGATIVE mg/dL
Hgb urine dipstick: NEGATIVE
KETONES UR: NEGATIVE mg/dL
LEUKOCYTES UA: NEGATIVE
NITRITE: NEGATIVE
Protein, ur: NEGATIVE mg/dL
Specific Gravity, Urine: 1.009 (ref 1.005–1.030)
pH: 6 (ref 5.0–8.0)

## 2016-03-31 LAB — I-STAT BETA HCG BLOOD, ED (MC, WL, AP ONLY): I-stat hCG, quantitative: <5 m[IU]/mL (ref <5)

## 2016-03-31 LAB — INFLUENZA PANEL BY PCR (TYPE A & B)
INFLBPCR: NEGATIVE
Influenza A By PCR: NEGATIVE

## 2016-03-31 LAB — I-STAT TROPONIN, ED: TROPONIN I, POC: 0 ng/mL (ref 0.00–0.08)

## 2016-03-31 LAB — LIPASE, BLOOD: LIPASE: 15 U/L (ref 11–51)

## 2016-03-31 MED ORDER — POTASSIUM CHLORIDE CRYS ER 20 MEQ PO TBCR
60.0000 meq | EXTENDED_RELEASE_TABLET | Freq: Once | ORAL | Status: AC
  Administered 2016-03-31: 60 meq via ORAL
  Filled 2016-03-31: qty 3

## 2016-03-31 MED ORDER — PROMETHAZINE HCL 25 MG/ML IJ SOLN
25.0000 mg | Freq: Once | INTRAMUSCULAR | Status: AC
  Administered 2016-03-31: 25 mg via INTRAVENOUS
  Filled 2016-03-31: qty 1

## 2016-03-31 MED ORDER — SODIUM CHLORIDE 0.9 % IV BOLUS (SEPSIS)
1000.0000 mL | Freq: Once | INTRAVENOUS | Status: AC
  Administered 2016-03-31: 1000 mL via INTRAVENOUS

## 2016-03-31 MED ORDER — PROMETHAZINE HCL 25 MG PO TABS: 25.0000 mg | ORAL_TABLET | Freq: Four times a day (QID) | ORAL | 0 refills | Status: DC | PRN

## 2016-03-31 NOTE — ED Notes (Signed)
Pt states nausea improved.

## 2016-03-31 NOTE — ED Provider Notes (Signed)
[facility]-EMERGENCY DEPT Provider Note   CSN: [id number] Arrival date & time: 03/31/16  0758     History   Chief Complaint Chief Complaint  Patient presents with  . Emesis  . Dizziness  . Diarrhea  . Numbness    HPI Lindsey [name] is a 46 y.o. female with a PMHx of remote CVA with residual periph vision loss and memory issues, remote MI s/p BMS to proximal LAD, apical mural thrombus on anticoagulation, antithrombin 3 deficiency, anemia, anxiety, GERD, HTN, HLD, diverticulosis, and other medical problems listed below, who presents to the ED with complaints of "not feeling good and being overly tired" beginning yesterday after she left work, and then around 5 AM she woke up and developed nausea, vomiting, and diarrhea. She has had 10 episodes of nonbloody nonbilious emesis and 10 episodes of nonbloody watery diarrhea since onset, was given Zofran en route which completely resolved her nausea, no known aggravating factors. Associated symptoms include feeling fatigued, chills, as well as intermittent bilateral hand and left arm paresthesias and lightheadedness with standing. Chart review reveals that on Christmas 2017 she was seen here for paresthesias and nausea as well, had a negative workup and was sent home. She does not have a neurologist that she follows with. She states that with her prior stroke (Nov 2011) she had paresthesias and aphasia, but currently does not have aphasia with this illness. She takes her xarelto as directed, except didn't take her meds this morning; cardiologist is Dr. Jens Som.  She denies fevers, URI symptoms, cough, new/worsened vision changes, HA, diaphoresis, CP, SOB, abd pain, constipation, melena, hematochezia, hematemesis, obstipation, hematuria, dysuria, myalgias, arthralgias, numbness, claudication, orthopnea, focal weakness, or any other complaints at this time. Denies recent sick contacts, suspicious food intake, travel, EtOH use, or NSAID use.    The history  is provided by the patient and medical records. No language interpreter was used.  Emesis   This is a new problem. The current episode started yesterday. The problem occurs 5 to 10 times per day. The problem has not changed since onset.The emesis has an appearance of stomach contents. There has been no fever. Associated symptoms include chills and diarrhea. Pertinent negatives include no abdominal pain, no arthralgias, no cough, no fever, no headaches and no myalgias.  Dizziness  Associated symptoms: diarrhea, nausea and vomiting   Associated symptoms: no blood in stool, no chest pain, no headaches, no shortness of breath and no weakness   Diarrhea   Associated symptoms include vomiting and chills. Pertinent negatives include no abdominal pain, no headaches, no arthralgias, no myalgias and no cough.    Past Medical History:  Diagnosis Date  . Anemia   . Antithrombin III deficiency (HCC)    ?pt not sure if true diagnosis  . Anxiety   . Apical mural thrombus   . Blood transfusion   . Coronary artery disease    a. apical LAD infarction '00. b. NSTEMI s/p BMS to prox LAD '09. c. Cath 01/2015: stable LAD stent, otherwise minimal nonobstructive CAD.  Marland Kitchen GERD (gastroesophageal reflux disease)   . Hyperlipidemia   . Hypertension   . Myocardial infarction   . Noncompliance with medication regimen    a. h/o noncompliance with med regimen (previous running out of Coumadin).  . Stroke (HCC)    assoc with short term memory loss and right peripheral vision loss; age 33   . TIA (transient ischemic attack) 2010  . Tobacco abuse     Patient Active Problem List  Diagnosis Date Noted  . GERD (gastroesophageal reflux disease) 04/24/2015  . Hypertensive urgency 04/23/2015  . Chest pain, unspecified   . Right leg swelling 03/07/2015  . Preventative health care 02/21/2015  . Esophageal reflux   . NSTEMI (non-ST elevated myocardial infarction) (HCC) 01/28/2015  . Nausea without vomiting 12/22/2014    . Dysuria 12/21/2014  . Diarrhea 09/26/2014  . Difficulty swallowing pills 09/26/2014  . Hemorrhoids 06/16/2014  . Elevated AST (SGOT) 03/27/2014  . Lumbar herniated disc 03/27/2014  . TIA (transient ischemic attack) 02/19/2014  . History of ischemic left PCA stroke 02/19/2014  . Palpitations 01/27/2014  . Depression 08/15/2013  . Long term current use of anticoagulant therapy 08/10/2013  . Gastritis and duodenitis 08/03/2013  . Diverticulosis 08/03/2013  . Fatty liver 08/03/2013  . Esophagitis 08/03/2013  . Hiatal hernia 08/03/2013  . Apical mural thrombus 08/02/2013  . GI bleeding 06/04/2012  . Antithrombin III deficiency (HCC) 06/04/2012  . Paresthesias 06/29/2011  . Hyperlipemia 06/07/2009  . Essential hypertension 05/17/2009  . OBESITY-MORBID (>100') 03/13/2008  . CAD S/P  BMS to proximal LAD 2009-patent 01/29/15 06/28/2007    Past Surgical History:  Procedure Laterality Date  . CARDIAC CATHETERIZATION    . CARDIAC CATHETERIZATION N/A 01/29/2015   Procedure: Left Heart Cath and Coronary Angiography;  Surgeon: Peter M Swaziland, MD;  [facility] CV LAB;  Service: Cardiovascular;  Laterality: N/A;  . COLONOSCOPY N/A 03/29/2014   Procedure: COLONOSCOPY;  Surgeon: Louis Meckel, MD;  Location: [facility] ENDOSCOPY;  Service: Endoscopy;  Laterality: N/A;  . CORONARY ANGIOPLASTY    . ESOPHAGOGASTRODUODENOSCOPY N/A 06/09/2012   Procedure: ESOPHAGOGASTRODUODENOSCOPY (EGD);  Surgeon: Theda Belfast, MD;  Location: [facility] ENDOSCOPY;  Service: Endoscopy;  Laterality: N/A;  . ESOPHAGOGASTRODUODENOSCOPY N/A 08/02/2013   Procedure: ESOPHAGOGASTRODUODENOSCOPY (EGD);  Surgeon: Meryl Dare, MD;  Location: [facility] ENDOSCOPY;  Service: Endoscopy;  Laterality: N/A;  . LEFT HEART CATHETERIZATION WITH CORONARY ANGIOGRAM N/A 12/24/2011   Procedure: LEFT HEART CATHETERIZATION WITH CORONARY ANGIOGRAM;  Surgeon: Kathleene Hazel, MD;  Location: [facility] CATH LAB;  Service: Cardiovascular;  Laterality:  N/A;  . TUBAL LIGATION      OB History    No data available       Home Medications    Prior to Admission medications   Medication Sig Start Date End Date Taking? Authorizing Provider  acetaminophen (TYLENOL) 500 MG tablet Take 1,000 mg by mouth every 6 (six) hours as needed for headache (pain).    Historical Provider, MD  ALPRAZolam Prudy Feeler) 0.25 MG tablet Take 1 tablet (0.25 mg total) by mouth at bedtime as needed for anxiety or sleep. 03/28/15   Ellsworth Lennox, PA  amLODipine (NORVASC) 5 MG tablet Take 5 mg by mouth daily.    Historical Provider, MD  atorvastatin (LIPITOR) 40 MG tablet Take 1 tablet (40 mg total) by mouth daily. 02/01/15   Darreld Mclean, MD  carvedilol (COREG) 25 MG tablet Take 1 tablet (25 mg total) by mouth 2 (two) times daily with a meal. 03/28/15   Ellsworth Lennox, PA  clopidogrel (PLAVIX) 75 MG tablet Take 1 tablet (75 mg total) by mouth daily. 03/28/15   Ellsworth Lennox, PA  HYDROcodone-acetaminophen (NORCO/VICODIN) 5-325 MG tablet Take 1-2 tablets by mouth every 6 (six) hours as needed for moderate pain. Patient not taking: Reported on 03/10/2016 07/21/15   Cathren Laine, MD  hydrocortisone (ANUSOL-HC) 2.5 % rectal cream Apply rectally 2 times daily prn Patient taking differently: Place 1 application rectally 2 (  two) times daily as needed for hemorrhoids.  07/21/15   Cathren Laine, MD  hydrocortisone-pramoxine [facility]) rectal foam Place 1 applicator rectally 2 (two) times daily. Patient taking differently: Place 1 applicator rectally daily as needed for hemorrhoids.  06/15/14   Devoria Albe, MD  nitroGLYCERIN (NITROSTAT) 0.4 MG SL tablet Place 1 tablet (0.4 mg total) under the tongue every 5 (five) minutes as needed for chest pain (up to 3 doses). 10/14/13   Beatrice Lecher, PA-C  ondansetron (ZOFRAN ODT) 8 MG disintegrating tablet Take 1 tablet (8 mg total) by mouth every 8 (eight) hours as needed for nausea or vomiting. 03/10/16   Linwood Dibbles, MD  ondansetron  (ZOFRAN) 4 MG tablet Take 1 tablet (4 mg total) by mouth every 6 (six) hours. Patient taking differently: Take 4 mg by mouth every 6 (six) hours as needed for nausea or vomiting.  08/09/15   Jerelyn Scott, MD  pantoprazole (PROTONIX) 40 MG tablet Take 1 tablet (40 mg total) by mouth daily. 02/01/15   Darreld Mclean, MD  rivaroxaban (XARELTO) 20 MG TABS tablet Take 1 tablet (20 mg total) by mouth daily with supper. 06/08/15   Abelino Derrick, PA-C    Family History Family History  Problem Relation Age of Onset  . Heart disease Brother     arrhythmia; died  . Breast cancer Maternal Aunt     Social History Social History  Substance Use Topics  . Smoking status: Former Smoker    Packs/day: 0.20    Years: 1.00    Types: Cigarettes    Quit date: 11/26/2014  . Smokeless tobacco: Never Used     Comment: STOPPED SMOKING IN SEPTEMBER 2016  . Alcohol use 0.0 oz/week     Comment: Social drinker     Allergies   Patient has no known allergies.   Review of Systems Review of Systems  Constitutional: Positive for chills and fatigue. Negative for diaphoresis and fever.  HENT: Negative for ear discharge, ear pain, rhinorrhea and sore throat.   Eyes: Negative for visual disturbance.  Respiratory: Negative for cough and shortness of breath.   Cardiovascular: Negative for chest pain.  Gastrointestinal: Positive for diarrhea, nausea and vomiting. Negative for abdominal pain, blood in stool and constipation.  Genitourinary: Negative for dysuria and hematuria.  Musculoskeletal: Negative for arthralgias and myalgias.  Skin: Negative for color change.  Allergic/Immunologic: Negative for immunocompromised state.  Neurological: Positive for dizziness and light-headedness (with standing). Negative for syncope, weakness, numbness and headaches.  Hematological: Bruises/bleeds easily (on xarelto).  Psychiatric/Behavioral: Negative for confusion.   10 Systems reviewed and are negative for acute change except  as noted in the HPI.   Physical Exam Updated Vital Signs BP 144/97 (BP Location: Right Arm)   Pulse 91   Temp 98.7 F (37.1 C) (Oral)   Resp 19   Ht 5\' 6"  (1.676 m)   Wt 72.6 kg   SpO2 100%   BMI 25.82 kg/m   Physical Exam  Constitutional: She is oriented to person, place, and time. Vital signs are normal. She appears well-developed and well-nourished.  Non-toxic appearance. No distress.  Afebrile, nontoxic, NAD, laughing during exam  HENT:  Head: Normocephalic and atraumatic.  Mouth/Throat: Oropharynx is clear and moist and mucous membranes are normal.  Eyes: Conjunctivae and EOM are normal. Pupils are equal, round, and reactive to light. Right eye exhibits no discharge. Left eye exhibits no discharge.  PERRL, EOMI, no nystagmus  Neck: Normal range of motion. Neck supple. No  spinous process tenderness and no muscular tenderness present. No neck rigidity. Normal range of motion present.  FROM intact without spinous process TTP, no bony stepoffs or deformities, no paraspinous muscle TTP or muscle spasms. No rigidity or meningeal signs. No bruising or swelling.   Cardiovascular: Normal rate, regular rhythm, normal heart sounds and intact distal pulses.  Exam reveals no gallop and no friction rub.   No murmur heard. Pulmonary/Chest: Effort normal and breath sounds normal. No respiratory distress. She has no decreased breath sounds. She has no wheezes. She has no rhonchi. She has no rales.  Abdominal: Soft. Normal appearance and bowel sounds are normal. She exhibits no distension. There is no tenderness. There is no rigidity, no rebound, no guarding, no CVA tenderness, no tenderness at McBurney's point and negative Murphy's sign.  Soft, NTND, +BS throughout, no r/g/r, neg murphy's, neg mcburney's, no CVA TTP   Musculoskeletal: Normal range of motion.  MAE x4 Strength and sensation grossly intact in all extremities Distal pulses intact Gait steady  Neurological: She is alert and  oriented to person, place, and time. She has normal strength. No cranial nerve deficit or sensory deficit. Coordination and gait normal. GCS eye subscore is 4. GCS verbal subscore is 5. GCS motor subscore is 6.  CN 2-12 grossly intact A&O x4 GCS 15 Sensation and strength intact Gait nonataxic Coordination with finger-to-nose WNL Neg pronator drift   Skin: Skin is warm, dry and intact. No rash noted.  Psychiatric: She has a normal mood and affect.  Nursing note and vitals reviewed.  08:43:24 Orthostatic Vital Signs MG  Orthostatic Lying   BP- Lying:  142/99  Pulse- Lying: 74      Orthostatic Sitting  BP- Sitting:  136/95  Pulse- Sitting: 86      Orthostatic Standing at 0 minutes  BP- Standing at 0 minutes:  134/102  Pulse- Standing at 0 minutes: 104     ED Treatments / Results  Labs (all labs ordered are listed, but only abnormal results are displayed) Labs Reviewed  CBC WITH DIFFERENTIAL/PLATELET - Abnormal; Notable for the following:       Result Value   Hemoglobin 11.6 (*)    HCT 35.3 (*)    RDW 17.6 (*)    All other components within normal limits  COMPREHENSIVE METABOLIC PANEL - Abnormal; Notable for the following:    Potassium 3.1 (*)    Glucose, Bld 107 (*)    Calcium 8.8 (*)    Albumin 3.4 (*)    AST 63 (*)    All other components within normal limits  URINALYSIS, ROUTINE W REFLEX MICROSCOPIC - Abnormal; Notable for the following:    Color, Urine STRAW (*)    All other components within normal limits  LIPASE, BLOOD  INFLUENZA PANEL BY PCR (TYPE A & B, H1N1)  I-STAT TROPOININ, ED  I-STAT BETA HCG BLOOD, ED ([facility], [facility], [facility] ONLY)    EKG  EKG Interpretation  Date/Time:  Monday March 31 2016 08:09:49 EST Ventricular Rate:  79 PR Interval:    QRS Duration: 97 QT Interval:  411 QTC Calculation: 472 R Axis:   91 Text Interpretation:  Sinus rhythm Borderline right axis deviation Low voltage, precordial leads Anteroseptal infarct, age indeterminate Baseline  wander in lead(s) II III aVR aVL aVF V4 V6 No significant change since last tracing Confirmed by Anitra Lauth  MD, Alphonzo Lemmings ([id number]) on 03/31/2016 8:14:53 AM       Radiology No results found.   CT head 03/10/16  Study Result: CLINICAL DATA:  Dizziness with upper extremity numbness. Previous stroke.  EXAM: CT HEAD WITHOUT CONTRAST  TECHNIQUE: Contiguous axial images were obtained from the base of the skull through the vertex without intravenous contrast.  COMPARISON:  02/19/2014  FINDINGS: Brain: The ventricles, cisterns and other CSF spaces are normal. There is no mass, mass effect, shift of midline structures or acute hemorrhage. Small old inferior left occipital infarct. No evidence of acute infarction.  Vascular: Within normal.  Skull: Within normal.  Sinuses/Orbits: Within normal.  Other: None.  IMPRESSION: No acute intracranial findings.  Old small inferior left occipital infarct.   Electronically Signed   By: Elberta Fortis M.D.   On: 03/10/2016 10:35     Echo 03/26/15 Study Conclusions - Left ventricle: LVEF is approximately 50% with apical akinesis.   Mural thrombus is again noted at apex. The cavity size was   normal. Wall thickness was normal. Doppler parameters are   consistent with abnormal left ventricular relaxation (grade 1   diastolic dysfunction).   Procedures Procedures (including critical care time)  LHC 01/29/15 Conclusion:  Prox RCA lesion, 20% stenosed.  Mid LAD to Dist LAD lesion, 20% stenosed.  There is mild left ventricular systolic dysfunction.   1. Minimal nonobstructive CAD. Patent stent in the LAD 2. Mild LV dysfunction with chronic akinesis of the apex. 3. Complex filling defect in the LV apex consistent with thrombus.  Plan: resume full anticoagulation. Will assess with Echo.      Medications Ordered in ED Medications  sodium chloride 0.9 % bolus 1,000 mL (0 mLs Intravenous Stopped 03/31/16 1016)  potassium  chloride SA (K-DUR,KLOR-CON) CR tablet 60 mEq (60 mEq Oral Given 03/31/16 1113)  sodium chloride 0.9 % bolus 1,000 mL (1,000 mLs Intravenous New Bag/Given 03/31/16 1113)  promethazine (PHENERGAN) injection 25 mg (25 mg Intravenous Given 03/31/16 1115)     Initial Impression / Assessment and Plan / ED Course  I have reviewed the triage vital signs and the nursing notes.  Pertinent labs & imaging results that were available during my care of the patient were reviewed by me and considered in my medical decision making (see chart for details).  Clinical Course     46 y.o. female here with fatigue, chills, n/v/d that began yesterday, then intermittent b/l hand and L arm paresthesias as well as lightheadedness with standing. Given zofran en route with relief of symptoms. Denies HA, URI symptoms, CP/SOB, or any other concerning symptoms. No focal neuro deficits, abd soft and nontender. Pt overall well appearing, afebrile and nontoxic. Laughing during exam. Extremities NVI and denies deficit in sensation to her upper extremities during exam. She was seen 2wks ago for very similar symptoms (paresthesias, HA, nausea) and had CT head which was negative for acute findings, as well as labs which were reassuring. Pt given zofran en route which she states helped. Denies pain anywhere, declines wanting anything else at this time. Will get basic labs, U/A, trop, EKG, betaHCG, flu swab, and orthostatics, and give her fluids. Likely viral illness. Doubt need for imaging at this time. Will reassess shortly.  10:32 AM Orthostatics with 30bpm rise from laying to standing, fluids finished and pt states she already feels better; lightheadedness likely related to orthostasis. EKG unchanged since prior, no acute ischemic findings. BetaHCG neg. Trop neg. CBC w/diff with stable anemia but otherwise unremarkable. CMP with mildly low K 3.1, will replete orally; also with mildly elevated AST similar to prior values. Lipase WNL. U/A  not  yet done, pt still doesn't feel like she needs to urinate; will repeat another 1L fluid bolus. Flu swab neg. Pt feeling better but states her nausea is coming back slightly; will give phenergan in addition to another fluid bolus and Kdur, then reassess shortly once the U/A results.  12:20 PM U/A unremarkable. Pt feeling much better, tolerating PO well. Overall her symptoms are c/w a viral illness and mild dehydration. Pt has zofran at home, advised using this, and will rx phenergan since this helped also; BRAT diet encouraged; stay hydrated; tylenol/motrin for any pain; f/up with PCP in 1wk for recheck of symptoms and ongoing management of her intermittent paresthesias. I explained the diagnosis and have given explicit precautions to return to the ER including for any other new or worsening symptoms. The patient understands and accepts the medical plan as it's been dictated and I have answered their questions. Discharge instructions concerning home care and prescriptions have been given. The patient is STABLE and is discharged to home in good condition.   Final Clinical Impressions(s) / ED Diagnoses   Final diagnoses:  Nausea vomiting and diarrhea  Paresthesia  Hypokalemia  Orthostatic lightheadedness  Chronic anemia  Viral illness    New Prescriptions New Prescriptions   PROMETHAZINE (PHENERGAN) 25 MG TABLET    Take 1 tablet (25 mg total) by mouth every 6 (six) hours as needed for nausea or vomiting.     [address] PA-C 03/31/16 1220    Gwyneth Sprout, MD 03/31/16 [phone]

## 2016-03-31 NOTE — ED Notes (Signed)
Pt states she understands instructions and all questions answered. Home stable with steady gait.

## 2016-03-31 NOTE — Discharge Instructions (Signed)
Overall your symptoms are consistent with a viral illness. Use your home zofran and/or phenergan prescription as directed, as needed for nausea. Alternate between tylenol and motrin as needed for pain. See the list of foods below to help supplement potassium in your diet. Stay well hydrated with small sips of fluids throughout the day. Follow a BRAT (banana-rice-applesauce-toast) diet as described below for the next 24-48 hours. The 'BRAT' diet is suggested, then progress to diet as tolerated as symptoms abate. Call your regular doctor if bloody stools, persistent diarrhea, vomiting, fever or abdominal pain. Follow up with your regular doctor in 1 week for recheck of symptoms and ongoing management of your recurrent hand numbness/tingling. Return to ER for changing or worsening of symptoms.

## 2016-03-31 NOTE — ED Triage Notes (Addendum)
Pt arrives EMS witih c/o NVD since yesterday with fever and chills and then c/o numbness at left hand/arm and right hand without weakness. Pt has hx of CVA and MI. zofran 4 mg iv by ems.

## 2016-04-08 ENCOUNTER — Ambulatory Visit: Payer: Medicaid Other | Admitting: Cardiology

## 2016-04-09 ENCOUNTER — Encounter: Payer: Self-pay | Admitting: *Deleted

## 2016-07-15 DIAGNOSIS — K819 Cholecystitis, unspecified: Secondary | ICD-10-CM

## 2016-07-15 HISTORY — DX: Cholecystitis, unspecified: K81.9

## 2016-08-07 ENCOUNTER — Emergency Department (HOSPITAL_COMMUNITY): Payer: Medicaid Other

## 2016-08-07 ENCOUNTER — Emergency Department (HOSPITAL_COMMUNITY)
Admission: EM | Admit: 2016-08-07 | Discharge: 2016-08-07 | Disposition: A | Discharge status: Home / Self Care | Payer: Medicaid Other | Attending: Emergency Medicine | Admitting: Emergency Medicine

## 2016-08-07 ENCOUNTER — Encounter (HOSPITAL_COMMUNITY): Payer: Self-pay

## 2016-08-07 ENCOUNTER — Emergency Department (HOSPITAL_BASED_OUTPATIENT_CLINIC_OR_DEPARTMENT_OTHER)
Admit: 2016-08-07 | Discharge: 2016-08-07 | Disposition: A | Discharge status: Home / Self Care | Payer: Medicaid Other | Attending: Student | Admitting: Student

## 2016-08-07 DIAGNOSIS — R600 Localized edema: Secondary | ICD-10-CM | POA: Insufficient documentation

## 2016-08-07 DIAGNOSIS — Z8673 Personal history of transient ischemic attack (TIA), and cerebral infarction without residual deficits: Secondary | ICD-10-CM | POA: Insufficient documentation

## 2016-08-07 DIAGNOSIS — Z87891 Personal history of nicotine dependence: Secondary | ICD-10-CM | POA: Insufficient documentation

## 2016-08-07 DIAGNOSIS — M79609 Pain in unspecified limb: Secondary | ICD-10-CM

## 2016-08-07 DIAGNOSIS — I2510 Atherosclerotic heart disease of native coronary artery without angina pectoris: Secondary | ICD-10-CM | POA: Insufficient documentation

## 2016-08-07 DIAGNOSIS — Z79899 Other long term (current) drug therapy: Secondary | ICD-10-CM | POA: Insufficient documentation

## 2016-08-07 DIAGNOSIS — M7989 Other specified soft tissue disorders: Secondary | ICD-10-CM | POA: Diagnosis not present

## 2016-08-07 DIAGNOSIS — I10 Essential (primary) hypertension: Secondary | ICD-10-CM | POA: Insufficient documentation

## 2016-08-07 LAB — BASIC METABOLIC PANEL
Anion gap: 9 (ref 5–15)
BUN: 6 mg/dL (ref 6–20)
CO2: 24 mmol/L (ref 22–32)
CREATININE: 0.8 mg/dL (ref 0.44–1.00)
Calcium: 9.4 mg/dL (ref 8.9–10.3)
Chloride: 109 mmol/L (ref 101–111)
Glucose, Bld: 90 mg/dL (ref 65–99)
Potassium: 4 mmol/L (ref 3.5–5.1)
SODIUM: 142 mmol/L (ref 135–145)

## 2016-08-07 LAB — CBC WITH DIFFERENTIAL/PLATELET
BASOS ABS: 0.1 10*3/uL (ref 0.0–0.1)
BASOS PCT: 2 %
EOS PCT: 3 %
Eosinophils Absolute: 0.2 10*3/uL (ref 0.0–0.7)
HEMATOCRIT: 34.2 % — AB (ref 36.0–46.0)
Hemoglobin: 11.1 g/dL — ABNORMAL LOW (ref 12.0–15.0)
Lymphocytes Relative: 30 %
Lymphs Abs: 2 10*3/uL (ref 0.7–4.0)
MCH: 29 pg (ref 26.0–34.0)
MCHC: 32.5 g/dL (ref 30.0–36.0)
MCV: 89.3 fL (ref 78.0–100.0)
MONO ABS: 0.5 10*3/uL (ref 0.1–1.0)
MONOS PCT: 7 %
Neutro Abs: 4.1 10*3/uL (ref 1.7–7.7)
Neutrophils Relative %: 58 %
PLATELETS: 228 10*3/uL (ref 150–400)
RBC: 3.83 MIL/uL — ABNORMAL LOW (ref 3.87–5.11)
RDW: 14.1 % (ref 11.5–15.5)
WBC: 6.9 10*3/uL (ref 4.0–10.5)

## 2016-08-07 LAB — BRAIN NATRIURETIC PEPTIDE: B Natriuretic Peptide: 39.5 pg/mL (ref 0.0–100.0)

## 2016-08-07 MED ORDER — HYDROCODONE-ACETAMINOPHEN 5-325 MG PO TABS
1.0000 | ORAL_TABLET | Freq: Once | ORAL | Status: AC
  Administered 2016-08-07: 1 via ORAL
  Filled 2016-08-07: qty 1

## 2016-08-07 NOTE — ED Provider Notes (Signed)
[facility]-EMERGENCY DEPT Provider Note   CSN: [id number] Arrival date & time: 08/07/16  1038     History   Chief Complaint Chief Complaint  Patient presents with  . Leg Swelling    HPI Lindsey [name] is a 46 y.o. female.  HPI 46 year old African-American female past medical history significant for anti-thrombin III deficiency currently on Xarelto, CAD (last EF 50% and 03/2016), hypertension, TIA, CVA that presents to the emergency Department today with complaints of lower extremity swelling and pain. The patient states that this has progressively worsened over the past month after starting a new job where she is standing more. States that the swelling is in her bilateral lower extremities and her color has darkened. States she has no history of clotting disorder and is currently on Xarelto. States that she has been using Tylenol #3 for pain in her lower legs just not helping. States that she did get compression hose but has not been wearing them. Patient denies any associated symptoms including fever, chills, headache, vision changes, chest pain, shortness of breath, lightheadedness, dizziness, abdominal pain, nausea, emesis, urinary symptoms, change in bowel habits, lower history paresthesias. Past Medical History:  Diagnosis Date  . Anemia   . Antithrombin III deficiency (HCC)    ?pt not sure if true diagnosis  . Anxiety   . Apical mural thrombus   . Blood transfusion   . Coronary artery disease    a. apical LAD infarction '00. b. NSTEMI s/p BMS to prox LAD '09. c. Cath 01/2015: stable LAD stent, otherwise minimal nonobstructive CAD.  Marland Kitchen GERD (gastroesophageal reflux disease)   . Hyperlipidemia   . Hypertension   . Myocardial infarction (HCC)   . Noncompliance with medication regimen    a. h/o noncompliance with med regimen (previous running out of Coumadin).  . Stroke (HCC)    assoc with short term memory loss and right peripheral vision loss; age 60   . TIA (transient ischemic  attack) 2010  . Tobacco abuse     Patient Active Problem List   Diagnosis Date Noted  . GERD (gastroesophageal reflux disease) 04/24/2015  . Hypertensive urgency 04/23/2015  . Chest pain, unspecified   . Right leg swelling 03/07/2015  . Preventative health care 02/21/2015  . Esophageal reflux   . NSTEMI (non-ST elevated myocardial infarction) (HCC) 01/28/2015  . Nausea without vomiting 12/22/2014  . Dysuria 12/21/2014  . Diarrhea 09/26/2014  . Difficulty swallowing pills 09/26/2014  . Hemorrhoids 06/16/2014  . Elevated AST (SGOT) 03/27/2014  . Lumbar herniated disc 03/27/2014  . TIA (transient ischemic attack) 02/19/2014  . History of ischemic left PCA stroke 02/19/2014  . Palpitations 01/27/2014  . Depression 08/15/2013  . Long term current use of anticoagulant therapy 08/10/2013  . Gastritis and duodenitis 08/03/2013  . Diverticulosis 08/03/2013  . Fatty liver 08/03/2013  . Esophagitis 08/03/2013  . Hiatal hernia 08/03/2013  . Apical mural thrombus 08/02/2013  . GI bleeding 06/04/2012  . Antithrombin III deficiency (HCC) 06/04/2012  . Paresthesias 06/29/2011  . Hyperlipemia 06/07/2009  . Essential hypertension 05/17/2009  . OBESITY-MORBID (>100') 03/13/2008  . CAD S/P  BMS to proximal LAD 2009-patent 01/29/15 06/28/2007    Past Surgical History:  Procedure Laterality Date  . CARDIAC CATHETERIZATION    . CARDIAC CATHETERIZATION N/A 01/29/2015   Procedure: Left Heart Cath and Coronary Angiography;  Surgeon: Peter M Swaziland, MD;  Location: [facility] INVASIVE CV LAB;  Service: Cardiovascular;  Laterality: N/A;  . COLONOSCOPY N/A 03/29/2014   Procedure: COLONOSCOPY;  Surgeon: Louis Meckel, MD;  Location: [facility] ENDOSCOPY;  Service: Endoscopy;  Laterality: N/A;  . CORONARY ANGIOPLASTY    . ESOPHAGOGASTRODUODENOSCOPY N/A 06/09/2012   Procedure: ESOPHAGOGASTRODUODENOSCOPY (EGD);  Surgeon: Theda Belfast, MD;  Location: [facility] ENDOSCOPY;  Service: Endoscopy;  Laterality: N/A;  .  ESOPHAGOGASTRODUODENOSCOPY N/A 08/02/2013   Procedure: ESOPHAGOGASTRODUODENOSCOPY (EGD);  Surgeon: Meryl Dare, MD;  Location: [facility] ENDOSCOPY;  Service: Endoscopy;  Laterality: N/A;  . LEFT HEART CATHETERIZATION WITH CORONARY ANGIOGRAM N/A 12/24/2011   Procedure: LEFT HEART CATHETERIZATION WITH CORONARY ANGIOGRAM;  Surgeon: Kathleene Hazel, MD;  Location: [facility] CATH LAB;  Service: Cardiovascular;  Laterality: N/A;  . TUBAL LIGATION      OB History    No data available       Home Medications    Prior to Admission medications   Medication Sig Start Date End Date Taking? Authorizing Provider  acetaminophen (TYLENOL) 500 MG tablet Take 1,000 mg by mouth every 6 (six) hours as needed for headache (pain).    [provider]  ALPRAZolam Prudy Feeler) 0.25 MG tablet Take 1 tablet (0.25 mg total) by mouth at bedtime as needed for anxiety or sleep. 03/28/15   Strader, Lennart Pall, PA-C  amLODipine (NORVASC) 5 MG tablet Take 5 mg by mouth daily.    [provider]  atorvastatin (LIPITOR) 40 MG tablet Take 1 tablet (40 mg total) by mouth daily. 02/01/15   Darreld Mclean, MD  carvedilol (COREG) 25 MG tablet Take 1 tablet (25 mg total) by mouth 2 (two) times daily with a meal. 03/28/15   Strader, [place] M, PA-C  clopidogrel (PLAVIX) 75 MG tablet Take 1 tablet (75 mg total) by mouth daily. 03/28/15   Strader, Lennart Pall, PA-C  HYDROcodone-acetaminophen (NORCO/VICODIN) 5-325 MG tablet Take 1-2 tablets by mouth every 6 (six) hours as needed for moderate pain. Patient not taking: Reported on 03/10/2016 07/21/15   Cathren Laine, MD  hydrocortisone (ANUSOL-HC) 2.5 % rectal cream Apply rectally 2 times daily prn Patient taking differently: Place 1 application rectally 2 (two) times daily as needed for hemorrhoids.  07/21/15   Cathren Laine, MD  hydrocortisone-pramoxine [facility]) rectal foam Place 1 applicator rectally 2 (two) times daily. Patient taking differently: Place 1 applicator rectally  daily as needed for hemorrhoids.  06/15/14   Devoria Albe, MD  nitroGLYCERIN (NITROSTAT) 0.4 MG SL tablet Place 1 tablet (0.4 mg total) under the tongue every 5 (five) minutes as needed for chest pain (up to 3 doses). 10/14/13   Tereso Newcomer T, PA-C  ondansetron (ZOFRAN ODT) 8 MG disintegrating tablet Take 1 tablet (8 mg total) by mouth every 8 (eight) hours as needed for nausea or vomiting. 03/10/16   Linwood Dibbles, MD  ondansetron (ZOFRAN) 4 MG tablet Take 1 tablet (4 mg total) by mouth every 6 (six) hours. Patient taking differently: Take 4 mg by mouth every 6 (six) hours as needed for nausea or vomiting.  08/09/15   Jerelyn Scott, MD  pantoprazole (PROTONIX) 40 MG tablet Take 1 tablet (40 mg total) by mouth daily. 02/01/15   Darreld Mclean, MD  promethazine (PHENERGAN) 25 MG tablet Take 1 tablet (25 mg total) by mouth every 6 (six) hours as needed for nausea or vomiting. 03/31/16   Street, Haines, PA-C  rivaroxaban (XARELTO) 20 MG TABS tablet Take 1 tablet (20 mg total) by mouth daily with supper. 06/08/15   Abelino Derrick, PA-C    Family History Family History  Problem Relation Age of Onset  . Heart  disease Brother        arrhythmia; died  . Breast cancer Maternal Aunt     Social History Social History  Substance Use Topics  . Smoking status: Former Smoker    Packs/day: 0.20    Years: 1.00    Types: Cigarettes    Quit date: 11/26/2014  . Smokeless tobacco: Never Used     Comment: STOPPED SMOKING IN SEPTEMBER 2016  . Alcohol use 0.0 oz/week     Comment: Social drinker     Allergies   Patient has no known allergies.   Review of Systems Review of Systems  Constitutional: Negative for chills and fever.  HENT: Negative for congestion.   Eyes: Negative for visual disturbance.  Respiratory: Negative for cough and shortness of breath.   Cardiovascular: Positive for leg swelling (bilateral). Negative for chest pain and palpitations.  Gastrointestinal: Negative for abdominal pain,  diarrhea, nausea and vomiting.  Genitourinary: Negative for dysuria, flank pain, frequency, hematuria and urgency.  Musculoskeletal: Negative for back pain.  Skin: Negative.   Neurological: Negative for dizziness, syncope, weakness, light-headedness and headaches.     Physical Exam Updated Vital Signs BP (!) 139/92 (BP Location: Left Arm)   Pulse 93   Temp 97.7 F (36.5 C) (Oral)   Resp 18   Ht 5\' 6"  (1.676 m)   Wt 72.1 kg (159 lb)   SpO2 99%   BMI 25.66 kg/m   Physical Exam  Constitutional: She is oriented to person, place, and time. She appears well-developed and well-nourished. No distress.  HENT:  Head: Normocephalic and atraumatic.  Mouth/Throat: Oropharynx is clear and moist.  Eyes: Conjunctivae are normal. Right eye exhibits no discharge. Left eye exhibits no discharge. No scleral icterus.  Neck: Normal range of motion. Neck supple. No thyromegaly present.  Cardiovascular: Normal rate, regular rhythm, normal heart sounds and intact distal pulses.  Exam reveals no gallop and no friction rub.   No murmur heard. Pulmonary/Chest: Effort normal. No respiratory distress. She has no wheezes. She has no rales. She exhibits no tenderness.  Abdominal: Soft. Bowel sounds are normal. She exhibits no distension. There is no tenderness. There is no rebound and no guarding.  Musculoskeletal: Normal range of motion. She exhibits edema and tenderness. She exhibits no deformity.  Patient with bilateral lower extremity edema. She does have pitting edema that is one plus at the ankles. DP pulses are 2+ bilaterally. Mild darkening of the lower extremities bilaterally at the level of the ankles. Strength 5 out of 5 in lower extremities. No warmth or redness noted. Sensation intact to sharp/dull. Cap refill normal.  Lymphadenopathy:    She has no cervical adenopathy.  Neurological: She is alert and oriented to person, place, and time.  Skin: Skin is warm and dry. Capillary refill takes less than  2 seconds.  Nursing note and vitals reviewed.    ED Treatments / Results  Labs (all labs ordered are listed, but only abnormal results are displayed) Labs Reviewed  CBC WITH DIFFERENTIAL/PLATELET - Abnormal; Notable for the following:       Result Value   RBC 3.83 (*)    Hemoglobin 11.1 (*)    HCT 34.2 (*)    All other components within normal limits  BASIC METABOLIC PANEL  BRAIN NATRIURETIC PEPTIDE    EKG  EKG Interpretation None       Radiology Dg Chest 2 View  Result Date: 08/07/2016 CLINICAL DATA:  Bilateral lower extremity swelling and pain. EXAM: CHEST  2  VIEW COMPARISON:  11/20/2015 FINDINGS: Normal heart size and mediastinal contours. No acute infiltrate or edema. No effusion or pneumothorax. No acute osseous findings. IMPRESSION: Negative chest. Electronically Signed   By: Marnee Spring M.D.   On: 08/07/2016 12:49    Procedures Procedures (including critical care time)  Medications Ordered in ED Medications  HYDROcodone-acetaminophen (NORCO/VICODIN) 5-325 MG per tablet 1 tablet (1 tablet Oral Given 08/07/16 1326)     Initial Impression / Assessment and Plan / ED Course  I have reviewed the triage vital signs and the nursing notes.  Pertinent labs & imaging results that were available during my care of the patient were reviewed by me and considered in my medical decision making (see chart for details).     Patient was asked to the emergency department today complaining of bilateral lower extremity edema and pain that has been ongoing for the past month. States that this started after a new job and she stands a lot. Patient does have a history of clotting disorder and is on blood thinners at baseline. Patient does have small amount of pitting edema to the ankles bilterally. Patient does have bronze color skin to the lower extremity. No signs of cellulitis. Neurovascularly intact. Doubt arterial insufficiency. Denies any chest pain or shortness of breath. Labs  and reassuring. Hemoglobin is baseline. BNP is normal. Electrolytes are normal. Chest x-ray shows no cardiomegaly. Doubt CHF. Last EF in 03/2016 was normal. Ultrasound of lower extremities without no blood clots but there are fluid in bilateral ankles. Patient's symptoms likely consistent with venous stasis. Encouraged compression and elevation at home. Will need follow-up with her primary care doctor and cardiologist if symptoms are worsening. Have given her strict return precautions. Pt is hemodynamically stable, in NAD, & able to ambulate in the ED. Pain has been managed & has no complaints prior to dc. Pt is comfortable with above plan and is stable for discharge at this time. All questions were answered prior to disposition. Strict return precautions for f/u to the ED were discussed. Pt seen by Dr. Ethelda Chick who is agreeable to the above plan.   Final Clinical Impressions(s) / ED Diagnoses   Final diagnoses:  Leg edema    New Prescriptions New Prescriptions   No medications on file     Wallace Keller 08/07/16 1441    Doug Sou, MD 08/07/16 1759

## 2016-08-07 NOTE — Discharge Instructions (Signed)
This is likely caused by poor circulation in her lower extremities. Make sure you're elevating her legs at night. Take Tylenol for pain. Use the compression stockings. Continue taking her blood thinner. All her blood work has been reassuring today. May need follow-up with your cardiologist and primary care doctor to have an echocardiogram of her heart in the near future. Make sure you return to the ED if you develops worsening swelling, chest pain, shortness of breath.

## 2016-08-07 NOTE — ED Notes (Signed)
Pt taken to Xray.

## 2016-08-07 NOTE — ED Notes (Signed)
MD at the bedside  

## 2016-08-07 NOTE — ED Provider Notes (Signed)
Plains of bilateral lower extremity pain for one month accompanied by swelling. On exam she is alert and in no distress bilateral lower extremities with trace pretibial pitting edema with chronic-appearing brawny changes of skin at distal third of legs and dorsum of feet bilaterally. DP pulses 2+ bilaterally. Good capillary refill   Doug Sou, MD 08/07/16 1441

## 2016-08-07 NOTE — ED Notes (Signed)
Vascular Tech at the bedside performing bedside vascular

## 2016-08-07 NOTE — ED Triage Notes (Signed)
Per Pt, Pt is coming from home with complaints of bilateral leg swelling that started about a month ago when patient started her new job. Pt reports having Hx of blood clots, but never in her legs. Pt has pain with ambulation and Hx of Clotting disorder.

## 2016-08-07 NOTE — ED Notes (Signed)
Pt taken to Vascular,.

## 2016-08-07 NOTE — Progress Notes (Signed)
Preliminary results by tech - Venous Duplex Lower Ext. Completed. Negative for deep and superficial vein thrombosis in both legs.  Athens Lebeau, BS, RDMS, RVT  

## 2016-08-13 ENCOUNTER — Inpatient Hospital Stay (HOSPITAL_COMMUNITY)
Admission: EM | Admit: 2016-08-13 | Discharge: 2016-08-16 | DRG: 418 | Disposition: A | Discharge status: Home / Self Care | Payer: Medicaid Other | Attending: Internal Medicine | Admitting: Internal Medicine

## 2016-08-13 ENCOUNTER — Encounter (HOSPITAL_COMMUNITY): Payer: Self-pay | Admitting: Emergency Medicine

## 2016-08-13 ENCOUNTER — Emergency Department (HOSPITAL_COMMUNITY): Payer: Medicaid Other

## 2016-08-13 DIAGNOSIS — F17210 Nicotine dependence, cigarettes, uncomplicated: Secondary | ICD-10-CM | POA: Diagnosis present

## 2016-08-13 DIAGNOSIS — Z7902 Long term (current) use of antithrombotics/antiplatelets: Secondary | ICD-10-CM

## 2016-08-13 DIAGNOSIS — Z8249 Family history of ischemic heart disease and other diseases of the circulatory system: Secondary | ICD-10-CM

## 2016-08-13 DIAGNOSIS — I2510 Atherosclerotic heart disease of native coronary artery without angina pectoris: Secondary | ICD-10-CM

## 2016-08-13 DIAGNOSIS — Z8673 Personal history of transient ischemic attack (TIA), and cerebral infarction without residual deficits: Secondary | ICD-10-CM

## 2016-08-13 DIAGNOSIS — I214 Non-ST elevation (NSTEMI) myocardial infarction: Secondary | ICD-10-CM

## 2016-08-13 DIAGNOSIS — D6859 Other primary thrombophilia: Secondary | ICD-10-CM | POA: Diagnosis present

## 2016-08-13 DIAGNOSIS — I10 Essential (primary) hypertension: Secondary | ICD-10-CM

## 2016-08-13 DIAGNOSIS — Z955 Presence of coronary angioplasty implant and graft: Secondary | ICD-10-CM

## 2016-08-13 DIAGNOSIS — E538 Deficiency of other specified B group vitamins: Secondary | ICD-10-CM | POA: Diagnosis present

## 2016-08-13 DIAGNOSIS — K810 Acute cholecystitis: Secondary | ICD-10-CM

## 2016-08-13 DIAGNOSIS — Z7901 Long term (current) use of anticoagulants: Secondary | ICD-10-CM

## 2016-08-13 DIAGNOSIS — I513 Intracardiac thrombosis, not elsewhere classified: Secondary | ICD-10-CM

## 2016-08-13 DIAGNOSIS — Z79899 Other long term (current) drug therapy: Secondary | ICD-10-CM

## 2016-08-13 DIAGNOSIS — I252 Old myocardial infarction: Secondary | ICD-10-CM

## 2016-08-13 DIAGNOSIS — D649 Anemia, unspecified: Secondary | ICD-10-CM | POA: Diagnosis present

## 2016-08-13 DIAGNOSIS — K219 Gastro-esophageal reflux disease without esophagitis: Secondary | ICD-10-CM | POA: Diagnosis present

## 2016-08-13 HISTORY — DX: Cholecystitis, unspecified: K81.9

## 2016-08-13 LAB — URINALYSIS, ROUTINE W REFLEX MICROSCOPIC
Bilirubin Urine: NEGATIVE
Glucose, UA: NEGATIVE mg/dL
HGB URINE DIPSTICK: NEGATIVE
Ketones, ur: NEGATIVE mg/dL
LEUKOCYTES UA: NEGATIVE
NITRITE: NEGATIVE
PROTEIN: NEGATIVE mg/dL
SPECIFIC GRAVITY, URINE: 1.019 (ref 1.005–1.030)
pH: 5 (ref 5.0–8.0)

## 2016-08-13 LAB — COMPREHENSIVE METABOLIC PANEL
ALK PHOS: 77 U/L (ref 38–126)
ALT: 11 U/L — ABNORMAL LOW (ref 14–54)
ANION GAP: 9 (ref 5–15)
AST: 20 U/L (ref 15–41)
Albumin: 3.5 g/dL (ref 3.5–5.0)
BUN: 7 mg/dL (ref 6–20)
CALCIUM: 9.6 mg/dL (ref 8.9–10.3)
CO2: 25 mmol/L (ref 22–32)
Chloride: 105 mmol/L (ref 101–111)
Creatinine, Ser: 0.76 mg/dL (ref 0.44–1.00)
Glucose, Bld: 126 mg/dL — ABNORMAL HIGH (ref 65–99)
Potassium: 3.9 mmol/L (ref 3.5–5.1)
Sodium: 139 mmol/L (ref 135–145)
Total Bilirubin: 0.5 mg/dL (ref 0.3–1.2)
Total Protein: 6.7 g/dL (ref 6.5–8.1)

## 2016-08-13 LAB — CBC
HCT: 38.5 % (ref 36.0–46.0)
HEMOGLOBIN: 12.2 g/dL (ref 12.0–15.0)
MCH: 28.3 pg (ref 26.0–34.0)
MCHC: 31.7 g/dL (ref 30.0–36.0)
MCV: 89.3 fL (ref 78.0–100.0)
Platelets: 243 10*3/uL (ref 150–400)
RBC: 4.31 MIL/uL (ref 3.87–5.11)
RDW: 14.2 % (ref 11.5–15.5)
WBC: 8.1 10*3/uL (ref 4.0–10.5)

## 2016-08-13 LAB — PREGNANCY, URINE: PREG TEST UR: NEGATIVE

## 2016-08-13 LAB — LIPASE, BLOOD: LIPASE: 24 U/L (ref 11–51)

## 2016-08-13 MED ORDER — MORPHINE SULFATE (PF) 4 MG/ML IV SOLN
4.0000 mg | Freq: Once | INTRAVENOUS | Status: AC
  Administered 2016-08-13: 4 mg via INTRAVENOUS
  Filled 2016-08-13: qty 1

## 2016-08-13 MED ORDER — IOPAMIDOL (ISOVUE-300) INJECTION 61%
INTRAVENOUS | Status: AC
  Administered 2016-08-13: 100 mL
  Filled 2016-08-13: qty 100

## 2016-08-13 MED ORDER — SODIUM CHLORIDE 0.9 % IV BOLUS (SEPSIS)
1000.0000 mL | Freq: Once | INTRAVENOUS | Status: AC
  Administered 2016-08-13: 1000 mL via INTRAVENOUS

## 2016-08-13 MED ORDER — ONDANSETRON HCL 4 MG/2ML IJ SOLN
4.0000 mg | Freq: Once | INTRAMUSCULAR | Status: AC
  Administered 2016-08-13: 4 mg via INTRAVENOUS
  Filled 2016-08-13: qty 2

## 2016-08-13 NOTE — ED Notes (Signed)
Patient transported to CT 

## 2016-08-13 NOTE — ED Triage Notes (Signed)
Pt sts upper abd pain to right side and in back with some N/V; pt sts pain x 4 days getting more severe

## 2016-08-13 NOTE — ED Notes (Signed)
Patient transported to US 

## 2016-08-14 ENCOUNTER — Inpatient Hospital Stay (HOSPITAL_COMMUNITY): Payer: Medicaid Other | Admitting: Certified Registered Nurse Anesthetist

## 2016-08-14 ENCOUNTER — Encounter (HOSPITAL_COMMUNITY): Payer: Self-pay | Admitting: Internal Medicine

## 2016-08-14 ENCOUNTER — Encounter (HOSPITAL_COMMUNITY)
Admission: EM | Disposition: A | Discharge status: Home / Self Care | Payer: Self-pay | Source: Home / Self Care | Attending: Internal Medicine

## 2016-08-14 DIAGNOSIS — Z8249 Family history of ischemic heart disease and other diseases of the circulatory system: Secondary | ICD-10-CM | POA: Diagnosis not present

## 2016-08-14 DIAGNOSIS — F17210 Nicotine dependence, cigarettes, uncomplicated: Secondary | ICD-10-CM | POA: Diagnosis present

## 2016-08-14 DIAGNOSIS — Z955 Presence of coronary angioplasty implant and graft: Secondary | ICD-10-CM | POA: Diagnosis not present

## 2016-08-14 DIAGNOSIS — I513 Intracardiac thrombosis, not elsewhere classified: Secondary | ICD-10-CM | POA: Diagnosis present

## 2016-08-14 DIAGNOSIS — K810 Acute cholecystitis: Secondary | ICD-10-CM | POA: Diagnosis present

## 2016-08-14 DIAGNOSIS — K219 Gastro-esophageal reflux disease without esophagitis: Secondary | ICD-10-CM | POA: Diagnosis present

## 2016-08-14 DIAGNOSIS — I10 Essential (primary) hypertension: Secondary | ICD-10-CM

## 2016-08-14 DIAGNOSIS — D6859 Other primary thrombophilia: Secondary | ICD-10-CM | POA: Diagnosis present

## 2016-08-14 DIAGNOSIS — I2510 Atherosclerotic heart disease of native coronary artery without angina pectoris: Secondary | ICD-10-CM | POA: Diagnosis present

## 2016-08-14 DIAGNOSIS — Z8673 Personal history of transient ischemic attack (TIA), and cerebral infarction without residual deficits: Secondary | ICD-10-CM

## 2016-08-14 DIAGNOSIS — Z7901 Long term (current) use of anticoagulants: Secondary | ICD-10-CM | POA: Diagnosis not present

## 2016-08-14 DIAGNOSIS — E538 Deficiency of other specified B group vitamins: Secondary | ICD-10-CM | POA: Diagnosis present

## 2016-08-14 DIAGNOSIS — Z7902 Long term (current) use of antithrombotics/antiplatelets: Secondary | ICD-10-CM | POA: Diagnosis not present

## 2016-08-14 DIAGNOSIS — Z79899 Other long term (current) drug therapy: Secondary | ICD-10-CM | POA: Diagnosis not present

## 2016-08-14 DIAGNOSIS — I252 Old myocardial infarction: Secondary | ICD-10-CM | POA: Diagnosis not present

## 2016-08-14 DIAGNOSIS — D649 Anemia, unspecified: Secondary | ICD-10-CM | POA: Diagnosis present

## 2016-08-14 HISTORY — PX: CHOLECYSTECTOMY: SHX55

## 2016-08-14 LAB — HEPATIC FUNCTION PANEL
ALT: 14 U/L (ref 14–54)
AST: 22 U/L (ref 15–41)
Albumin: 3 g/dL — ABNORMAL LOW (ref 3.5–5.0)
Alkaline Phosphatase: 71 U/L (ref 38–126)
BILIRUBIN DIRECT: 0.1 mg/dL (ref 0.1–0.5)
BILIRUBIN INDIRECT: 0.4 mg/dL (ref 0.3–0.9)
BILIRUBIN TOTAL: 0.5 mg/dL (ref 0.3–1.2)
Total Protein: 5.6 g/dL — ABNORMAL LOW (ref 6.5–8.1)

## 2016-08-14 LAB — BASIC METABOLIC PANEL
Anion gap: 8 (ref 5–15)
BUN: 7 mg/dL (ref 6–20)
CHLORIDE: 109 mmol/L (ref 101–111)
CO2: 23 mmol/L (ref 22–32)
CREATININE: 0.64 mg/dL (ref 0.44–1.00)
Calcium: 8.9 mg/dL (ref 8.9–10.3)
GFR calc Af Amer: >60 mL/min (ref >60)
GFR calc non Af Amer: >60 mL/min (ref >60)
Glucose, Bld: 131 mg/dL — ABNORMAL HIGH (ref 65–99)
POTASSIUM: 4 mmol/L (ref 3.5–5.1)
SODIUM: 140 mmol/L (ref 135–145)

## 2016-08-14 LAB — GLUCOSE, CAPILLARY
GLUCOSE-CAPILLARY: 102 mg/dL — AB (ref 65–99)
Glucose-Capillary: 126 mg/dL — ABNORMAL HIGH (ref 65–99)

## 2016-08-14 LAB — HIV ANTIBODY (ROUTINE TESTING W REFLEX): HIV Screen 4th Generation wRfx: NONREACTIVE

## 2016-08-14 LAB — CBC
HEMATOCRIT: 34.3 % — AB (ref 36.0–46.0)
Hemoglobin: 10.8 g/dL — ABNORMAL LOW (ref 12.0–15.0)
MCH: 28.3 pg (ref 26.0–34.0)
MCHC: 31.5 g/dL (ref 30.0–36.0)
MCV: 90 fL (ref 78.0–100.0)
Platelets: 230 10*3/uL (ref 150–400)
RBC: 3.81 MIL/uL — ABNORMAL LOW (ref 3.87–5.11)
RDW: 14.3 % (ref 11.5–15.5)
WBC: 6.2 10*3/uL (ref 4.0–10.5)

## 2016-08-14 LAB — VITAMIN B12: VITAMIN B 12: 106 pg/mL — AB (ref 180–914)

## 2016-08-14 LAB — IRON AND TIBC
Iron: 53 ug/dL (ref 28–170)
Saturation Ratios: 16 % (ref 10.4–31.8)
TIBC: 332 ug/dL (ref 250–450)
UIBC: 279 ug/dL

## 2016-08-14 LAB — FOLATE: Folate: 10 ng/mL (ref >5.9)

## 2016-08-14 LAB — SURGICAL PCR SCREEN
MRSA, PCR: NEGATIVE
STAPHYLOCOCCUS AUREUS: NEGATIVE

## 2016-08-14 LAB — FERRITIN: Ferritin: 14 ng/mL (ref 11–307)

## 2016-08-14 SURGERY — LAPAROSCOPIC CHOLECYSTECTOMY
Anesthesia: General | Site: Abdomen

## 2016-08-14 MED ORDER — ACETAMINOPHEN 650 MG RE SUPP: 650.0000 mg | Freq: Four times a day (QID) | RECTAL | Status: DC | PRN

## 2016-08-14 MED ORDER — CHLORHEXIDINE GLUCONATE 0.12 % MT SOLN
15.0000 mL | Freq: Two times a day (BID) | OROMUCOSAL | Status: DC
  Administered 2016-08-14 – 2016-08-15 (×3): 15 mL via OROMUCOSAL
  Filled 2016-08-14 (×3): qty 15

## 2016-08-14 MED ORDER — ACETAMINOPHEN 325 MG PO TABS: 650.0000 mg | ORAL_TABLET | Freq: Four times a day (QID) | ORAL | Status: DC | PRN

## 2016-08-14 MED ORDER — OXYCODONE HCL 5 MG PO TABS
5.0000 mg | ORAL_TABLET | ORAL | Status: DC | PRN
  Administered 2016-08-14 – 2016-08-16 (×7): 10 mg via ORAL
  Filled 2016-08-14 (×7): qty 2

## 2016-08-14 MED ORDER — MEPERIDINE HCL 25 MG/ML IJ SOLN: 6.2500 mg | INTRAMUSCULAR | Status: DC | PRN

## 2016-08-14 MED ORDER — HYDRALAZINE HCL 20 MG/ML IJ SOLN
10.0000 mg | Freq: Once | INTRAMUSCULAR | Status: AC
  Administered 2016-08-14: 10 mg via INTRAVENOUS

## 2016-08-14 MED ORDER — ROCURONIUM BROMIDE 10 MG/ML (PF) SYRINGE
PREFILLED_SYRINGE | INTRAVENOUS | Status: AC
  Filled 2016-08-14: qty 5

## 2016-08-14 MED ORDER — HYDROMORPHONE HCL 1 MG/ML IJ SOLN
INTRAMUSCULAR | Status: AC
  Filled 2016-08-14: qty 0.5

## 2016-08-14 MED ORDER — ESMOLOL HCL 100 MG/10ML IV SOLN
INTRAVENOUS | Status: AC
  Filled 2016-08-14: qty 10

## 2016-08-14 MED ORDER — DIPHENHYDRAMINE HCL 50 MG/ML IJ SOLN: 12.5000 mg | Freq: Four times a day (QID) | INTRAMUSCULAR | Status: DC | PRN

## 2016-08-14 MED ORDER — ONDANSETRON HCL 4 MG/2ML IJ SOLN
4.0000 mg | Freq: Four times a day (QID) | INTRAMUSCULAR | Status: DC | PRN
  Administered 2016-08-14: 4 mg via INTRAVENOUS
  Filled 2016-08-14: qty 2

## 2016-08-14 MED ORDER — FENTANYL CITRATE (PF) 250 MCG/5ML IJ SOLN
INTRAMUSCULAR | Status: AC
  Filled 2016-08-14: qty 5

## 2016-08-14 MED ORDER — DEXTROSE-NACL 5-0.9 % IV SOLN
INTRAVENOUS | Status: AC
  Administered 2016-08-14 (×2): via INTRAVENOUS

## 2016-08-14 MED ORDER — LIDOCAINE 2% (20 MG/ML) 5 ML SYRINGE
INTRAMUSCULAR | Status: AC
  Filled 2016-08-14: qty 5

## 2016-08-14 MED ORDER — SODIUM CHLORIDE 0.9 % IR SOLN
Status: DC | PRN
  Administered 2016-08-14: 1000 mL

## 2016-08-14 MED ORDER — HYDRALAZINE HCL 20 MG/ML IJ SOLN
10.0000 mg | INTRAMUSCULAR | Status: DC | PRN
  Administered 2016-08-14 – 2016-08-15 (×2): 10 mg via INTRAVENOUS
  Filled 2016-08-14 (×2): qty 1

## 2016-08-14 MED ORDER — ONDANSETRON HCL 4 MG/2ML IJ SOLN
INTRAMUSCULAR | Status: DC | PRN
  Administered 2016-08-14: 4 mg via INTRAVENOUS

## 2016-08-14 MED ORDER — SUGAMMADEX SODIUM 200 MG/2ML IV SOLN
INTRAVENOUS | Status: DC | PRN
  Administered 2016-08-14: 170 mg via INTRAVENOUS

## 2016-08-14 MED ORDER — PIPERACILLIN-TAZOBACTAM 3.375 G IVPB 30 MIN
3.3750 g | INTRAVENOUS | Status: AC
  Administered 2016-08-14: 3.375 g via INTRAVENOUS
  Filled 2016-08-14: qty 50

## 2016-08-14 MED ORDER — IOPAMIDOL (ISOVUE-300) INJECTION 61%
INTRAVENOUS | Status: AC
  Filled 2016-08-14: qty 50

## 2016-08-14 MED ORDER — FENTANYL CITRATE (PF) 100 MCG/2ML IJ SOLN
INTRAMUSCULAR | Status: DC | PRN
Start: 2016-08-14 — End: 2016-08-14
  Administered 2016-08-14: 50 ug via INTRAVENOUS
  Administered 2016-08-14: 100 ug via INTRAVENOUS
  Administered 2016-08-14 (×6): 50 ug via INTRAVENOUS

## 2016-08-14 MED ORDER — OXYCODONE HCL 5 MG/5ML PO SOLN
5.0000 mg | Freq: Once | ORAL | Status: DC | PRN
Start: 2016-08-14 — End: 2016-08-14

## 2016-08-14 MED ORDER — PROPOFOL 10 MG/ML IV BOLUS
INTRAVENOUS | Status: DC | PRN
  Administered 2016-08-14: 150 mg via INTRAVENOUS
  Administered 2016-08-14: 50 mg via INTRAVENOUS

## 2016-08-14 MED ORDER — HYDRALAZINE HCL 20 MG/ML IJ SOLN
INTRAMUSCULAR | Status: AC
  Filled 2016-08-14: qty 1

## 2016-08-14 MED ORDER — PIPERACILLIN-TAZOBACTAM 3.375 G IVPB
3.3750 g | Freq: Three times a day (TID) | INTRAVENOUS | Status: DC
  Administered 2016-08-14: 3.375 g via INTRAVENOUS
  Filled 2016-08-14: qty 50

## 2016-08-14 MED ORDER — ENOXAPARIN SODIUM 40 MG/0.4ML ~~LOC~~ SOLN
40.0000 mg | SUBCUTANEOUS | Status: DC
  Administered 2016-08-15: 40 mg via SUBCUTANEOUS
  Filled 2016-08-14: qty 0.4

## 2016-08-14 MED ORDER — PROPOFOL 10 MG/ML IV BOLUS
INTRAVENOUS | Status: AC
  Filled 2016-08-14: qty 40

## 2016-08-14 MED ORDER — LIDOCAINE 2% (20 MG/ML) 5 ML SYRINGE
INTRAMUSCULAR | Status: DC | PRN
  Administered 2016-08-14: 80 mg via INTRAVENOUS

## 2016-08-14 MED ORDER — MORPHINE SULFATE (PF) 4 MG/ML IV SOLN
1.0000 mg | INTRAVENOUS | Status: DC | PRN
  Administered 2016-08-14 – 2016-08-15 (×4): 2 mg via INTRAVENOUS
  Administered 2016-08-16: 4 mg via INTRAVENOUS
  Filled 2016-08-14 (×5): qty 1

## 2016-08-14 MED ORDER — FENTANYL CITRATE (PF) 250 MCG/5ML IJ SOLN
INTRAMUSCULAR | Status: AC
Start: 2016-08-14 — End: 2016-08-14
  Filled 2016-08-14: qty 5

## 2016-08-14 MED ORDER — NITROGLYCERIN 0.4 MG SL SUBL: 0.4000 mg | SUBLINGUAL_TABLET | SUBLINGUAL | Status: DC | PRN

## 2016-08-14 MED ORDER — DOCUSATE SODIUM 100 MG PO CAPS
100.0000 mg | ORAL_CAPSULE | Freq: Two times a day (BID) | ORAL | Status: DC
  Administered 2016-08-14 – 2016-08-16 (×4): 100 mg via ORAL
  Filled 2016-08-14 (×4): qty 1

## 2016-08-14 MED ORDER — ONDANSETRON HCL 4 MG PO TABS: 4.0000 mg | ORAL_TABLET | Freq: Four times a day (QID) | ORAL | Status: DC | PRN

## 2016-08-14 MED ORDER — 0.9 % SODIUM CHLORIDE (POUR BTL) OPTIME
TOPICAL | Status: DC | PRN
  Administered 2016-08-14: 1000 mL

## 2016-08-14 MED ORDER — PROMETHAZINE HCL 25 MG/ML IJ SOLN: 6.2500 mg | INTRAMUSCULAR | Status: DC | PRN

## 2016-08-14 MED ORDER — DEXTROSE 5 % IV SOLN
2.0000 g | Freq: Once | INTRAVENOUS | Status: AC
  Administered 2016-08-14: 2 g via INTRAVENOUS
  Filled 2016-08-14: qty 2

## 2016-08-14 MED ORDER — ROCURONIUM BROMIDE 10 MG/ML (PF) SYRINGE
PREFILLED_SYRINGE | INTRAVENOUS | Status: DC | PRN
  Administered 2016-08-14: 50 mg via INTRAVENOUS
  Administered 2016-08-14: 10 mg via INTRAVENOUS

## 2016-08-14 MED ORDER — HEPARIN SODIUM (PORCINE) 5000 UNIT/ML IJ SOLN
5000.0000 [IU] | INTRAMUSCULAR | Status: AC
  Administered 2016-08-14: 5000 [IU] via SUBCUTANEOUS
  Filled 2016-08-14: qty 1

## 2016-08-14 MED ORDER — BUPIVACAINE HCL (PF) 0.25 % IJ SOLN
INTRAMUSCULAR | Status: AC
  Filled 2016-08-14: qty 30

## 2016-08-14 MED ORDER — MIDAZOLAM HCL 2 MG/2ML IJ SOLN
INTRAMUSCULAR | Status: AC
  Filled 2016-08-14: qty 2

## 2016-08-14 MED ORDER — KETOROLAC TROMETHAMINE 15 MG/ML IJ SOLN: 15.0000 mg | Freq: Four times a day (QID) | INTRAMUSCULAR | Status: DC | PRN

## 2016-08-14 MED ORDER — MORPHINE SULFATE (PF) 4 MG/ML IV SOLN
2.0000 mg | INTRAVENOUS | Status: DC | PRN
  Administered 2016-08-14 (×2): 2 mg via INTRAVENOUS
  Filled 2016-08-14 (×2): qty 1

## 2016-08-14 MED ORDER — MORPHINE SULFATE (PF) 2 MG/ML IV SOLN: 1.0000 mg | INTRAVENOUS | Status: DC | PRN

## 2016-08-14 MED ORDER — LACTATED RINGERS IV SOLN
INTRAVENOUS | Status: DC | PRN
  Administered 2016-08-14 (×2): via INTRAVENOUS

## 2016-08-14 MED ORDER — OXYCODONE HCL 5 MG PO TABS: 5.0000 mg | ORAL_TABLET | Freq: Once | ORAL | Status: DC | PRN

## 2016-08-14 MED ORDER — ESMOLOL HCL 100 MG/10ML IV SOLN
INTRAVENOUS | Status: DC | PRN
  Administered 2016-08-14: 30 mg via INTRAVENOUS

## 2016-08-14 MED ORDER — MORPHINE SULFATE (PF) 4 MG/ML IV SOLN
4.0000 mg | Freq: Once | INTRAVENOUS | Status: AC
  Administered 2016-08-14: 4 mg via INTRAVENOUS
  Filled 2016-08-14: qty 1

## 2016-08-14 MED ORDER — HYDROMORPHONE HCL 1 MG/ML IJ SOLN
0.2500 mg | INTRAMUSCULAR | Status: DC | PRN
  Administered 2016-08-14 (×2): 0.5 mg via INTRAVENOUS

## 2016-08-14 MED ORDER — ORAL CARE MOUTH RINSE
15.0000 mL | Freq: Two times a day (BID) | OROMUCOSAL | Status: DC
  Administered 2016-08-14 – 2016-08-15 (×3): 15 mL via OROMUCOSAL

## 2016-08-14 MED ORDER — LACTATED RINGERS IV SOLN: INTRAVENOUS | Status: DC

## 2016-08-14 MED ORDER — SUGAMMADEX SODIUM 200 MG/2ML IV SOLN
INTRAVENOUS | Status: AC
  Filled 2016-08-14: qty 2

## 2016-08-14 MED ORDER — BUPIVACAINE-EPINEPHRINE (PF) 0.5% -1:200000 IJ SOLN
INTRAMUSCULAR | Status: AC
  Filled 2016-08-14: qty 30

## 2016-08-14 MED ORDER — DIPHENHYDRAMINE HCL 12.5 MG/5ML PO ELIX: 12.5000 mg | ORAL_SOLUTION | Freq: Four times a day (QID) | ORAL | Status: DC | PRN

## 2016-08-14 SURGICAL SUPPLY — 46 items
APPLIER CLIP 5 13 M/L LIGAMAX5 (MISCELLANEOUS) ×3
BANDAGE ADH SHEER 1  50/CT (GAUZE/BANDAGES/DRESSINGS) ×9 IMPLANT
BENZOIN TINCTURE PRP APPL 2/3 (GAUZE/BANDAGES/DRESSINGS) ×3 IMPLANT
BLADE CLIPPER SURG (BLADE) IMPLANT
CANISTER SUCT 3000ML PPV (MISCELLANEOUS) ×3 IMPLANT
CHLORAPREP W/TINT 26ML (MISCELLANEOUS) ×3 IMPLANT
CLIP APPLIE 5 13 M/L LIGAMAX5 (MISCELLANEOUS) ×1 IMPLANT
CLOSURE WOUND 1/2 X4 (GAUZE/BANDAGES/DRESSINGS) ×1
COVER SURGICAL LIGHT HANDLE (MISCELLANEOUS) ×3 IMPLANT
DRSG TEGADERM 4X4.75 (GAUZE/BANDAGES/DRESSINGS) ×3 IMPLANT
ELECT REM PT RETURN 9FT ADLT (ELECTROSURGICAL) ×3
ELECTRODE REM PT RTRN 9FT ADLT (ELECTROSURGICAL) ×1 IMPLANT
ENDOLOOP SUT PDS II  0 18 (SUTURE) ×4
ENDOLOOP SUT PDS II 0 18 (SUTURE) ×2 IMPLANT
GAUZE SPONGE 2X2 8PLY STRL LF (GAUZE/BANDAGES/DRESSINGS) ×1 IMPLANT
GLOVE BIO SURGEON STRL SZ7.5 (GLOVE) ×3 IMPLANT
GLOVE BIO SURGEON STRL SZ8 (GLOVE) ×6 IMPLANT
GLOVE BIOGEL M STRL SZ7.5 (GLOVE) ×3 IMPLANT
GLOVE BIOGEL PI IND STRL 7.5 (GLOVE) ×1 IMPLANT
GLOVE BIOGEL PI IND STRL 8 (GLOVE) ×2 IMPLANT
GLOVE BIOGEL PI INDICATOR 7.5 (GLOVE) ×2
GLOVE BIOGEL PI INDICATOR 8 (GLOVE) ×4
GOWN STRL REUS W/ TWL LRG LVL3 (GOWN DISPOSABLE) ×2 IMPLANT
GOWN STRL REUS W/TWL 2XL LVL3 (GOWN DISPOSABLE) ×3 IMPLANT
GOWN STRL REUS W/TWL LRG LVL3 (GOWN DISPOSABLE) ×4
GRASPER SUT TROCAR 14GX15 (MISCELLANEOUS) ×3 IMPLANT
KIT BASIN OR (CUSTOM PROCEDURE TRAY) ×3 IMPLANT
KIT ROOM TURNOVER OR (KITS) ×3 IMPLANT
NS IRRIG 1000ML POUR BTL (IV SOLUTION) ×3 IMPLANT
PAD ARMBOARD 7.5X6 YLW CONV (MISCELLANEOUS) ×3 IMPLANT
POUCH RETRIEVAL ECOSAC 10 (ENDOMECHANICALS) ×1 IMPLANT
POUCH RETRIEVAL ECOSAC 10MM (ENDOMECHANICALS) ×2
SCISSORS LAP 5X35 DISP (ENDOMECHANICALS) ×3 IMPLANT
SET IRRIG TUBING LAPAROSCOPIC (IRRIGATION / IRRIGATOR) ×3 IMPLANT
SLEEVE ENDOPATH XCEL 5M (ENDOMECHANICALS) ×6 IMPLANT
SPECIMEN JAR SMALL (MISCELLANEOUS) ×3 IMPLANT
SPONGE GAUZE 2X2 STER 10/PKG (GAUZE/BANDAGES/DRESSINGS) ×2
STRIP CLOSURE SKIN 1/2X4 (GAUZE/BANDAGES/DRESSINGS) ×2 IMPLANT
SUT MNCRL AB 4-0 PS2 18 (SUTURE) ×3 IMPLANT
SUT VICRYL 0 UR6 27IN ABS (SUTURE) IMPLANT
TOWEL OR 17X24 6PK STRL BLUE (TOWEL DISPOSABLE) ×3 IMPLANT
TOWEL OR 17X26 10 PK STRL BLUE (TOWEL DISPOSABLE) ×3 IMPLANT
TRAY LAPAROSCOPIC MC (CUSTOM PROCEDURE TRAY) ×3 IMPLANT
TROCAR XCEL BLUNT TIP 100MML (ENDOMECHANICALS) ×3 IMPLANT
TROCAR XCEL NON-BLD 5MMX100MML (ENDOMECHANICALS) ×3 IMPLANT
TUBING INSUFFLATION (TUBING) ×3 IMPLANT

## 2016-08-14 NOTE — Op Note (Signed)
Lindsey [name] [id number] Sep 10, 1970 08/14/2016  Laparoscopic Cholecystectomy Procedure Note  Indications: This patient presents with symptomatic gallbladder disease and will undergo laparoscopic cholecystectomy. Please see chart for additional details  Pre-operative Diagnosis: cholecystitis  Post-operative Diagnosis: Acute cholecystitis  Surgeon: Atilano Ina   Assistants: Magnus Ivan RNFA  Anesthesia: General endotracheal anesthesia   Procedure Details  The patient was seen again in the Holding Room. The risks, benefits, complications, treatment options, and expected outcomes were discussed with the patient. The possibilities of reaction to medication, pulmonary aspiration, perforation of viscus, bleeding, recurrent infection, finding a normal gallbladder, the need for additional procedures, failure to diagnose a condition, the possible need to convert to an open procedure, and creating a complication requiring transfusion or operation were discussed with the patient. The likelihood of improving the patient's symptoms with return to their baseline status is good.  The patient and/or family concurred with the proposed plan, giving informed consent. The site of surgery properly noted. The patient was taken to Operating Room, identified as Lindsey [name] and the procedure verified as Laparoscopic Cholecystectomy. Time Out was held and the above information confirmed. Antibiotic prophylaxis was administered.   Prior to the induction of general anesthesia, antibiotic prophylaxis was administered. General endotracheal anesthesia was then administered and tolerated well. After the induction, the abdomen was prepped with Chloraprep and draped in the sterile fashion. The patient was positioned in the supine position.  Local anesthetic agent was injected into the skin near the umbilicus and an incision made. We dissected down to the abdominal fascia with blunt dissection.  The fascia was incised  vertically and we entered the peritoneal cavity bluntly.  A pursestring suture of 0-Vicryl was placed around the fascial opening.  The Hasson cannula was inserted and secured with the stay suture.  Pneumoperitoneum was then created with CO2 and tolerated well without any adverse changes in the patient's vital signs. An 5-mm port was placed in the subxiphoid position.  Two 5-mm ports were placed in the right upper quadrant. All skin incisions were infiltrated with a local anesthetic agent before making the incision and placing the trocars.   We positioned the patient in reverse Trendelenburg, tilted slightly to the patient's left. There were a few omental adhesions in the LUQ to the anterior abdominal wall near tail of pancreas location. She also had a omental adhesion to the abdominal wall in the supraumbilical position.  The gallbladder was identified, the fundus grasped and retracted cephalad. The gallbladder was distended and had edema in the wall.  Adhesions were lysed bluntly and with the electrocautery where indicated, taking care not to injure any adjacent organs or viscus. The infundibulum was grasped and retracted laterally, exposing the peritoneum overlying the triangle of Calot. This was then divided and exposed in a blunt fashion. The node of Calot was enlarged. A critical view of the cystic duct and cystic artery was obtained.  The cystic duct was clearly identified and bluntly dissected circumferentially. The node was somewhat adhered to the cystic duct so I decided to take the cystic duct right at the junction with the infundibulum.  The cystic duct was ligated with a clip distally.   The cystic duct was also dilated.   The cystic duct was then ligated with one clip proximally and divided. The cystic artery which had been identified & dissected free was ligated with clips and divided as well. I placed an PDS endoloop around the cystic duct stump as well.    The gallbladder  was dissected from the  liver bed in retrograde fashion with the electrocautery. The gallbladder was removed and placed in an Ecco sac.  The gallbladder and Ecco sac were then removed through the umbilical port site. The liver bed was irrigated and inspected. Hemostasis was achieved with the electrocautery. Copious irrigation was utilized and was repeatedly aspirated until clear.  The pursestring suture was used to close the umbilical fascia.  I took the filmy supraumbilical adhesive band.  I did elect to place an additional interrupted 0 vicryl in the umbilical fascia using the PMI suture passer.   We again inspected the right upper quadrant for hemostasis.  The umbilical closure was inspected and there was no air leak and nothing trapped within the closure. Pneumoperitoneum was released as we removed the trocars.  4-0 Monocryl was used to close the skin.   Benzoin, steri-strips, and clean dressings were applied. The patient was then extubated and brought to the recovery room in stable condition. Instrument, sponge, and needle counts were correct at closure and at the conclusion of the case.   Findings: Cholecystitis  Estimated Blood Loss: Minimal         Drains: none         Specimens: Gallbladder           Complications: None; patient tolerated the procedure well.         Disposition: PACU - hemodynamically stable.         Condition: stable  Mary Sella. Andrey Campanile, MD, FACS General, Bariatric, & Minimally Invasive Surgery [facility], [place]

## 2016-08-14 NOTE — ED Provider Notes (Signed)
[facility]-EMERGENCY DEPT Provider Note   CSN: [id number] Arrival date & time: 08/13/16  1340     History   Chief Complaint Chief Complaint  Patient presents with  . Abdominal Pain    HPI Lindsey [name] is a 46 y.o. female.  HPI Lindsey [name] is a 46 y.o. female presents to emergency department complaining of abdominal pain for the last 4 days. Patient states initially pain would come and go, now more constant. She unable to eat or drink anything due to pain. She reports associated nausea and vomiting. Reports some loose stools. Pain is mild in the right upper quadrant and radiates to the back. Denies any fever or chills. No blood in her stool or emesis. Has not been taking any medications at home for her symptoms. No prior abdominal surgeries other than tubal ligation. No fever or chills. States any food or drinks make pain worse.  Pain is better when laying on the right side. No urinary symptoms. No vaginal discharge or bleeding.  Past Medical History:  Diagnosis Date  . Anemia   . Antithrombin III deficiency (HCC)    ?pt not sure if true diagnosis  . Anxiety   . Apical mural thrombus   . Blood transfusion   . Coronary artery disease    a. apical LAD infarction '00. b. NSTEMI s/p BMS to prox LAD '09. c. Cath 01/2015: stable LAD stent, otherwise minimal nonobstructive CAD.  Marland Kitchen GERD (gastroesophageal reflux disease)   . Hyperlipidemia   . Hypertension   . Myocardial infarction (HCC)   . Noncompliance with medication regimen    a. h/o noncompliance with med regimen (previous running out of Coumadin).  . Stroke (HCC)    assoc with short term memory loss and right peripheral vision loss; age 48   . TIA (transient ischemic attack) 2010  . Tobacco abuse     Patient Active Problem List   Diagnosis Date Noted  . GERD (gastroesophageal reflux disease) 04/24/2015  . Hypertensive urgency 04/23/2015  . Chest pain, unspecified   . Right leg swelling 03/07/2015  . Preventative health  care 02/21/2015  . Esophageal reflux   . NSTEMI (non-ST elevated myocardial infarction) (HCC) 01/28/2015  . Nausea without vomiting 12/22/2014  . Dysuria 12/21/2014  . Diarrhea 09/26/2014  . Difficulty swallowing pills 09/26/2014  . Hemorrhoids 06/16/2014  . Elevated AST (SGOT) 03/27/2014  . Lumbar herniated disc 03/27/2014  . TIA (transient ischemic attack) 02/19/2014  . History of ischemic left PCA stroke 02/19/2014  . Palpitations 01/27/2014  . Depression 08/15/2013  . Long term current use of anticoagulant therapy 08/10/2013  . Gastritis and duodenitis 08/03/2013  . Diverticulosis 08/03/2013  . Fatty liver 08/03/2013  . Esophagitis 08/03/2013  . Hiatal hernia 08/03/2013  . Apical mural thrombus 08/02/2013  . GI bleeding 06/04/2012  . Antithrombin III deficiency (HCC) 06/04/2012  . Paresthesias 06/29/2011  . Hyperlipemia 06/07/2009  . Essential hypertension 05/17/2009  . OBESITY-MORBID (>100') 03/13/2008  . CAD S/P  BMS to proximal LAD 2009-patent 01/29/15 06/28/2007    Past Surgical History:  Procedure Laterality Date  . CARDIAC CATHETERIZATION    . CARDIAC CATHETERIZATION N/A 01/29/2015   Procedure: Left Heart Cath and Coronary Angiography;  Surgeon: Peter M Swaziland, MD;  Location: [facility] INVASIVE CV LAB;  Service: Cardiovascular;  Laterality: N/A;  . COLONOSCOPY N/A 03/29/2014   Procedure: COLONOSCOPY;  Surgeon: Louis Meckel, MD;  Location: [facility] ENDOSCOPY;  Service: Endoscopy;  Laterality: N/A;  . CORONARY ANGIOPLASTY    .  ESOPHAGOGASTRODUODENOSCOPY N/A 06/09/2012   Procedure: ESOPHAGOGASTRODUODENOSCOPY (EGD);  Surgeon: Theda Belfast, MD;  Location: [facility] ENDOSCOPY;  Service: Endoscopy;  Laterality: N/A;  . ESOPHAGOGASTRODUODENOSCOPY N/A 08/02/2013   Procedure: ESOPHAGOGASTRODUODENOSCOPY (EGD);  Surgeon: Meryl Dare, MD;  Location: [facility] ENDOSCOPY;  Service: Endoscopy;  Laterality: N/A;  . LEFT HEART CATHETERIZATION WITH CORONARY ANGIOGRAM N/A 12/24/2011   Procedure: LEFT  HEART CATHETERIZATION WITH CORONARY ANGIOGRAM;  Surgeon: Kathleene Hazel, MD;  Location: [facility] CATH LAB;  Service: Cardiovascular;  Laterality: N/A;  . TUBAL LIGATION      OB History    No data available       Home Medications    Prior to Admission medications   Medication Sig Start Date End Date Taking? Authorizing Provider  acetaminophen (TYLENOL) 500 MG tablet Take 1,000 mg by mouth every 6 (six) hours as needed for headache (pain).    [provider]  ALPRAZolam Prudy Feeler) 0.25 MG tablet Take 1 tablet (0.25 mg total) by mouth at bedtime as needed for anxiety or sleep. 03/28/15   Strader, Lennart Pall, PA-C  amLODipine (NORVASC) 5 MG tablet Take 5 mg by mouth daily.    [provider]  atorvastatin (LIPITOR) 40 MG tablet Take 1 tablet (40 mg total) by mouth daily. 02/01/15   Darreld Mclean, MD  carvedilol (COREG) 25 MG tablet Take 1 tablet (25 mg total) by mouth 2 (two) times daily with a meal. 03/28/15   Strader, [place] M, PA-C  clopidogrel (PLAVIX) 75 MG tablet Take 1 tablet (75 mg total) by mouth daily. 03/28/15   Strader, Lennart Pall, PA-C  HYDROcodone-acetaminophen (NORCO/VICODIN) 5-325 MG tablet Take 1-2 tablets by mouth every 6 (six) hours as needed for moderate pain. Patient not taking: Reported on 03/10/2016 07/21/15   Cathren Laine, MD  hydrocortisone (ANUSOL-HC) 2.5 % rectal cream Apply rectally 2 times daily prn Patient taking differently: Place 1 application rectally 2 (two) times daily as needed for hemorrhoids.  07/21/15   Cathren Laine, MD  hydrocortisone-pramoxine [facility]) rectal foam Place 1 applicator rectally 2 (two) times daily. Patient taking differently: Place 1 applicator rectally daily as needed for hemorrhoids.  06/15/14   Devoria Albe, MD  nitroGLYCERIN (NITROSTAT) 0.4 MG SL tablet Place 1 tablet (0.4 mg total) under the tongue every 5 (five) minutes as needed for chest pain (up to 3 doses). 10/14/13   Tereso Newcomer T, PA-C  ondansetron (ZOFRAN  ODT) 8 MG disintegrating tablet Take 1 tablet (8 mg total) by mouth every 8 (eight) hours as needed for nausea or vomiting. 03/10/16   Linwood Dibbles, MD  ondansetron (ZOFRAN) 4 MG tablet Take 1 tablet (4 mg total) by mouth every 6 (six) hours. Patient taking differently: Take 4 mg by mouth every 6 (six) hours as needed for nausea or vomiting.  08/09/15   Jerelyn Scott, MD  pantoprazole (PROTONIX) 40 MG tablet Take 1 tablet (40 mg total) by mouth daily. 02/01/15   Darreld Mclean, MD  promethazine (PHENERGAN) 25 MG tablet Take 1 tablet (25 mg total) by mouth every 6 (six) hours as needed for nausea or vomiting. 03/31/16   Street, Bradley, PA-C  rivaroxaban (XARELTO) 20 MG TABS tablet Take 1 tablet (20 mg total) by mouth daily with supper. 06/08/15   Abelino Derrick, PA-C    Family History Family History  Problem Relation Age of Onset  . Heart disease Brother        arrhythmia; died  . Breast cancer Maternal Aunt     Social History  Social History  Substance Use Topics  . Smoking status: Former Smoker    Packs/day: 0.20    Years: 1.00    Types: Cigarettes    Quit date: 11/26/2014  . Smokeless tobacco: Never Used     Comment: STOPPED SMOKING IN SEPTEMBER 2016  . Alcohol use 0.0 oz/week     Comment: Social drinker     Allergies   Patient has no known allergies.   Review of Systems Review of Systems  Constitutional: Negative for chills and fever.  Respiratory: Negative for cough, chest tightness and shortness of breath.   Cardiovascular: Negative for chest pain, palpitations and leg swelling.  Gastrointestinal: Positive for abdominal pain, nausea and vomiting. Negative for blood in stool and diarrhea.  Genitourinary: Negative for dysuria, flank pain, pelvic pain, vaginal bleeding, vaginal discharge and vaginal pain.  Musculoskeletal: Negative for arthralgias, myalgias, neck pain and neck stiffness.  Skin: Negative for rash.  Neurological: Negative for dizziness, weakness and headaches.    All other systems reviewed and are negative.    Physical Exam Updated Vital Signs BP 125/81 (BP Location: Right Arm)   Pulse 81   Temp 98.2 F (36.8 C) (Oral)   Resp 16   SpO2 100%   Physical Exam  Constitutional: She is oriented to person, place, and time. She appears well-developed and well-nourished. No distress.  HENT:  Head: Normocephalic.  Eyes: Conjunctivae are normal.  Neck: Neck supple.  Cardiovascular: Normal rate, regular rhythm and normal heart sounds.   Pulmonary/Chest: Effort normal and breath sounds normal. No respiratory distress. She has no wheezes. She has no rales.  Abdominal: Soft. Bowel sounds are normal. She exhibits no distension. There is tenderness. There is no rebound.  Right upper quadrant tenderness. Positive Murphy's sign  Musculoskeletal: She exhibits no edema.  Neurological: She is alert and oriented to person, place, and time.  Skin: Skin is warm and dry.  Psychiatric: She has a normal mood and affect. Her behavior is normal.  Nursing note and vitals reviewed.    ED Treatments / Results  Labs (all labs ordered are listed, but only abnormal results are displayed) Labs Reviewed  COMPREHENSIVE METABOLIC PANEL - Abnormal; Notable for the following:       Result Value   Glucose, Bld 126 (*)    ALT 11 (*)    All other components within normal limits  URINALYSIS, ROUTINE W REFLEX MICROSCOPIC - Abnormal; Notable for the following:    APPearance HAZY (*)    All other components within normal limits  LIPASE, BLOOD  CBC  PREGNANCY, URINE    EKG  EKG Interpretation None       Radiology US Abdomen Complete  Result Date: 08/13/2016 CLINICAL DATA:  Initial evaluation for acute abdominal pain for 5 days. EXAM: ABDOMEN ULTRASOUND COMPLETE COMPARISON:  Prior CT from 04/08/2015. FINDINGS: Gallbladder: No echogenic stones seen within the gallbladder lumen. Gallbladder wall minimally thickened to 3.7 mm, most likely related to partial  contraction. No free pericholecystic fluid. A positive sonographic Murphy sign was elicited on exam. Common bile duct: Diameter: 4.5 mm Liver: Mildly heterogeneous without focal lesion. Within normal limits for echogenicity. IVC: No abnormality visualized. Pancreas: Visualized portion unremarkable. Spleen: Size and appearance within normal limits. Right Kidney: Length: 11.5 cm. Echogenicity within normal limits. No mass or hydronephrosis visualized. Left Kidney: Length: 10.7 cm. Echogenicity within normal limits. No mass or hydronephrosis visualized. Abdominal aorta: No aneurysm visualized. Other findings: None. IMPRESSION: 1. Positive sonographic Murphy sign without cholelithiasis or  additional sonographic features to suggest acute cholecystitis. 2. No other acute abnormality within the abdomen. Electronically Signed   By: Rise Mu M.D.   On: 08/13/2016 22:39   Ct Abdomen Pelvis W Contrast  Result Date: 08/14/2016 CLINICAL DATA:  Acute onset of right upper quadrant abdominal pain and flank pain. Nausea and vomiting. Initial encounter. EXAM: CT ABDOMEN AND PELVIS WITH CONTRAST TECHNIQUE: Multidetector CT imaging of the abdomen and pelvis was performed using the standard protocol following bolus administration of intravenous contrast. CONTRAST:  ISOVUE-300 IOPAMIDOL (ISOVUE-300) INJECTION 61% COMPARISON:  CT of the abdomen and pelvis from 03/23/2015, and abdominal ultrasound performed earlier today at 10:03 p.m. FINDINGS: Lower chest: The visualized lung bases are grossly clear. The visualized portions of the mediastinum are unremarkable. Hepatobiliary: The liver is unremarkable in appearance. There is diffuse gallbladder wall thickening, with mild surrounding soft tissue inflammation, concerning for acute cholecystitis. The common bile duct remains normal in caliber. Pancreas: The pancreas is within normal limits. Spleen: The spleen is unremarkable in appearance. Adrenals/Urinary Tract: The  adrenal glands are unremarkable in appearance. The kidneys are within normal limits. There is no evidence of hydronephrosis. No renal or ureteral stones are identified. No perinephric stranding is seen. Stomach/Bowel: The stomach is unremarkable in appearance. The small bowel is within normal limits. The appendix is normal in caliber, without evidence of appendicitis. A diverticulum is noted at the ascending colon, and mild diverticulosis is noted along the distal descending colon, without evidence of diverticulitis. Vascular/Lymphatic: The abdominal aorta is unremarkable in appearance. The inferior vena cava is grossly unremarkable. No retroperitoneal lymphadenopathy is seen. No pelvic sidewall lymphadenopathy is identified. Reproductive: The bladder is mildly distended and within normal limits. The uterus is grossly unremarkable in appearance. The ovaries are relatively symmetric. No suspicious adnexal masses are seen. The patient is status post bilateral tubal ligation. Other: No additional soft tissue abnormalities are seen. Musculoskeletal: No acute osseous abnormalities are identified. The visualized musculature is unremarkable in appearance. IMPRESSION: 1. Diffuse gallbladder wall thickening, with mild surrounding soft inflammation, concerning for acute cholecystitis. 2. Mild diverticulosis at the distal descending colon and at the ascending colon, without evidence of diverticulitis. Electronically Signed   By: Roanna Raider M.D.   On: 08/14/2016 00:31    Procedures Procedures (including critical care time)  Medications Ordered in ED Medications  cefTRIAXone (ROCEPHIN) 2 g in dextrose 5 % 50 mL IVPB (2 g Intravenous New Bag/Given 08/14/16 0048)  sodium chloride 0.9 % bolus 1,000 mL (0 mLs Intravenous Stopped 08/13/16 2336)  ondansetron (ZOFRAN) injection 4 mg (4 mg Intravenous Given 08/13/16 2131)  morphine 4 MG/ML injection 4 mg (4 mg Intravenous Given 08/13/16 2131)  iopamidol (ISOVUE-300) 61 %  injection (100 mLs  Contrast Given 08/13/16 2343)  morphine 4 MG/ML injection 4 mg (4 mg Intravenous Given 08/14/16 0058)     Initial Impression / Assessment and Plan / ED Course  I have reviewed the triage vital signs and the nursing notes.  Pertinent labs & imaging results that were available during my care of the patient were reviewed by me and considered in my medical decision making (see chart for details).     Patient in emergency department with intermittent right upper quadrant abdominal pain with nausea and vomiting. Patient has pretty significant tenderness in the right upper quadrant and positive Murphy sign. Will get ultrasound of her abdomen to further evaluate gallbladder. Lab work already obtained at triage show a normal Kozub blood cell count, normal LFTs  and lipase, no significant abnormalities. Will give pain medication for pain.   1:05 AM Discussed patient with Dr.Tsuei, with general surgery, advised due to patient's extensive medical history to admit to medicine and get medical and cardiac clearance. I did explain to him that patient is on Xarelto which is listed in her medicine list, however patient admitted to me just now that she has not been taking it for about a week and a half because she was planning to do a dental procedure coming up. Patient is denying any chest pain or shortness of breath. We will add an EKG.  Spoke with the [facility] hospitalist, they will admit patient.  Vitals:   08/13/16 2045 08/13/16 2100 08/13/16 2250 08/14/16 0039  BP: (!) 154/102 (!) 141/115 (!) 154/103 125/81  Pulse:   88 81  Resp:   18 16  Temp:      TempSrc:      SpO2:   100% 100%     Final Clinical Impressions(s) / ED Diagnoses   Final diagnoses:  Acute cholecystitis    New Prescriptions New Prescriptions   No medications on file     Jaynie Crumble, PA-C 08/14/16 0109    Tegeler, Canary Brim, MD 08/14/16 [phone]

## 2016-08-14 NOTE — Anesthesia Procedure Notes (Addendum)
Procedure Name: Intubation Date/Time: 08/14/2016 2:16 PM Performed by: Annabelle Harman A Pre-anesthesia Checklist: Patient identified, Emergency Drugs available, Suction available and Patient being monitored Patient Re-evaluated:Patient Re-evaluated prior to inductionOxygen Delivery Method: Circle system utilized Preoxygenation: Pre-oxygenation with 100% oxygen Intubation Type: IV induction Ventilation: Mask ventilation without difficulty Laryngoscope Size: 2 and Miller Grade View: Grade I Tube type: Oral Tube size: 7.0 mm Number of attempts: 1 Airway Equipment and Method: Stylet Placement Confirmation: ETT inserted through vocal cords under direct vision,  positive ETCO2 and breath sounds checked- equal and bilateral Secured at: 23 cm Tube secured with: Tape Dental Injury: Teeth and Oropharynx as per pre-operative assessment

## 2016-08-14 NOTE — H&P (Addendum)
Lindsey [name] is an 46 y.o. female.   Chief Complaint: abdominal pain HPI:  46 y.o. female with PMH significant for CAD s/p MI and stroke presents to emergency department complaining of abdominal pain for the last 4 days. Patient states initially pain was intermittent, now more constant. She unable to eat or drink anything due to pain. She reports associated nausea and vomiting. Reports some loose stools. Pain is mild in the right upper quadrant and radiates to the back. Denies any fever or chills. No blood in her stool or emesis. Has not been taking any medications at home for her symptoms. No prior abdominal surgeries other than tubal ligation. No fever or chills. States any food or drinks make pain worse.  Pain is better when laying on the right side. No urinary symptoms. No vaginal discharge or bleeding.  Past Medical History:  Diagnosis Date  . Anemia   . Antithrombin III deficiency ([place])    ?pt not sure if true diagnosis  . Anxiety   . Apical mural thrombus   . Blood transfusion   . Coronary artery disease    a. apical LAD infarction '00. b. NSTEMI s/p BMS to prox LAD '09. c. Cath 01/2015: stable LAD stent, otherwise minimal nonobstructive CAD.  Marland Kitchen GERD (gastroesophageal reflux disease)   . Hyperlipidemia   . Hypertension   . Myocardial infarction ([place])   . Noncompliance with medication regimen    a. h/o noncompliance with med regimen (previous running out of Coumadin).  . Stroke ([place])    assoc with short term memory loss and right peripheral vision loss; age 54   . TIA (transient ischemic attack) 2010  . Tobacco abuse     Past Surgical History:  Procedure Laterality Date  . CARDIAC CATHETERIZATION    . CARDIAC CATHETERIZATION N/A 01/29/2015   Procedure: Left Heart Cath and Coronary Angiography;  Surgeon: Peter M Martinique, MD;  Location: [facility];  Service: Cardiovascular;  Laterality: N/A;  . COLONOSCOPY N/A 03/29/2014   Procedure: COLONOSCOPY;  Surgeon: Inda Castle, MD;  Location: [facility];  Service: Endoscopy;  Laterality: N/A;  . CORONARY ANGIOPLASTY    . ESOPHAGOGASTRODUODENOSCOPY N/A 06/09/2012   Procedure: ESOPHAGOGASTRODUODENOSCOPY (EGD);  Surgeon: Beryle Beams, MD;  Location: [facility];  Service: Endoscopy;  Laterality: N/A;  . ESOPHAGOGASTRODUODENOSCOPY N/A 08/02/2013   Procedure: ESOPHAGOGASTRODUODENOSCOPY (EGD);  Surgeon: Ladene Artist, MD;  Location: [facility];  Service: Endoscopy;  Laterality: N/A;  . LEFT HEART CATHETERIZATION WITH CORONARY ANGIOGRAM N/A 12/24/2011   Procedure: LEFT HEART CATHETERIZATION WITH CORONARY ANGIOGRAM;  Surgeon: Burnell Blanks, MD;  Location: [facility];  Service: Cardiovascular;  Laterality: N/A;  . TUBAL LIGATION      Family History  Problem Relation Age of Onset  . Heart disease Brother        arrhythmia; died  . Breast cancer Maternal Aunt    Social History:  reports that she quit smoking about 20 months ago. Her smoking use included Cigarettes. She has a 0.20 pack-year smoking history. She has never used smokeless tobacco. She reports that she drinks alcohol. She reports that she uses drugs, including Cocaine.  Allergies: No Known Allergies  Medications Prior to Admission  Medication Sig Dispense Refill  . acetaminophen (TYLENOL) 500 MG tablet Take 1,000 mg by mouth every 6 (six) hours as needed for headache (pain).    . nitroGLYCERIN (NITROSTAT) 0.4 MG SL tablet Place 1 tablet (0.4 mg total) under the tongue every 5 (  five) minutes as needed for chest pain (up to 3 doses). 25 tablet 3  . rivaroxaban (XARELTO) 20 MG TABS tablet Take 1 tablet (20 mg total) by mouth daily with supper. 30 tablet 6  . ALPRAZolam (XANAX) 0.25 MG tablet Take 1 tablet (0.25 mg total) by mouth at bedtime as needed for anxiety or sleep. (Patient not taking: Reported on 08/14/2016) 30 tablet 0  . atorvastatin (LIPITOR) 40 MG tablet Take 1 tablet (40 mg total) by mouth daily. (Patient not taking: Reported on  08/14/2016) 30 tablet 2  . carvedilol (COREG) 25 MG tablet Take 1 tablet (25 mg total) by mouth 2 (two) times daily with a meal. (Patient not taking: Reported on 08/14/2016) 60 tablet 6  . clopidogrel (PLAVIX) 75 MG tablet Take 1 tablet (75 mg total) by mouth daily. (Patient not taking: Reported on 08/14/2016) 30 tablet 6  . HYDROcodone-acetaminophen (NORCO/VICODIN) 5-325 MG tablet Take 1-2 tablets by mouth every 6 (six) hours as needed for moderate pain. (Patient not taking: Reported on 03/10/2016) 10 tablet 0  . hydrocortisone (ANUSOL-HC) 2.5 % rectal cream Apply rectally 2 times daily prn (Patient not taking: Reported on 08/14/2016) 28 g 0  . hydrocortisone-pramoxine (PROCTOFOAM-HC) rectal foam Place 1 applicator rectally 2 (two) times daily. (Patient not taking: Reported on 08/14/2016) 10 g 0  . ondansetron (ZOFRAN ODT) 8 MG disintegrating tablet Take 1 tablet (8 mg total) by mouth every 8 (eight) hours as needed for nausea or vomiting. (Patient not taking: Reported on 08/14/2016) 12 tablet 0  . ondansetron (ZOFRAN) 4 MG tablet Take 1 tablet (4 mg total) by mouth every 6 (six) hours. (Patient not taking: Reported on 08/14/2016) 12 tablet 0  . pantoprazole (PROTONIX) 40 MG tablet Take 1 tablet (40 mg total) by mouth daily. (Patient not taking: Reported on 08/14/2016) 30 tablet 2  . promethazine (PHENERGAN) 25 MG tablet Take 1 tablet (25 mg total) by mouth every 6 (six) hours as needed for nausea or vomiting. (Patient not taking: Reported on 08/14/2016) 10 tablet 0    Results for orders placed or performed during the hospital encounter of 08/13/16 (from the past 48 hour(s))  Lipase, blood     Status: None   Collection Time: 08/13/16  2:39 PM  Result Value Ref Range   Lipase 24 11 - 51 U/L  Comprehensive metabolic panel     Status: Abnormal   Collection Time: 08/13/16  2:39 PM  Result Value Ref Range   Sodium 139 135 - 145 mmol/L   Potassium 3.9 3.5 - 5.1 mmol/L   Chloride 105 101 - 111 mmol/L   CO2  25 22 - 32 mmol/L   Glucose, Bld 126 (H) 65 - 99 mg/dL   BUN 7 6 - 20 mg/dL   Creatinine, Ser 0.76 0.44 - 1.00 mg/dL   Calcium 9.6 8.9 - 10.3 mg/dL   Total Protein 6.7 6.5 - 8.1 g/dL   Albumin 3.5 3.5 - 5.0 g/dL   AST 20 15 - 41 U/L   ALT 11 (L) 14 - 54 U/L   Alkaline Phosphatase 77 38 - 126 U/L   Total Bilirubin 0.5 0.3 - 1.2 mg/dL   GFR calc non Af Amer >60 >60 mL/min   GFR calc Af Amer >60 >60 mL/min    Comment: (NOTE) The eGFR has been calculated using the CKD EPI equation. This calculation has not been validated in all clinical situations. eGFR's persistently <60 mL/min signify possible Chronic Kidney Disease.    Anion gap  9 5 - 15  CBC     Status: None   Collection Time: 08/13/16  2:39 PM  Result Value Ref Range   WBC 8.1 4.0 - 10.5 K/uL   RBC 4.31 3.87 - 5.11 MIL/uL   Hemoglobin 12.2 12.0 - 15.0 g/dL   HCT 38.5 36.0 - 46.0 %   MCV 89.3 78.0 - 100.0 fL   MCH 28.3 26.0 - 34.0 pg   MCHC 31.7 30.0 - 36.0 g/dL   RDW 14.2 11.5 - 15.5 %   Platelets 243 150 - 400 K/uL  Urinalysis, Routine w reflex microscopic     Status: Abnormal   Collection Time: 08/13/16  8:01 PM  Result Value Ref Range   Color, Urine YELLOW YELLOW   APPearance HAZY (A) CLEAR   Specific Gravity, Urine 1.019 1.005 - 1.030   pH 5.0 5.0 - 8.0   Glucose, UA NEGATIVE NEGATIVE mg/dL   Hgb urine dipstick NEGATIVE NEGATIVE   Bilirubin Urine NEGATIVE NEGATIVE   Ketones, ur NEGATIVE NEGATIVE mg/dL   Protein, ur NEGATIVE NEGATIVE mg/dL   Nitrite NEGATIVE NEGATIVE   Leukocytes, UA NEGATIVE NEGATIVE  Pregnancy, urine     Status: None   Collection Time: 08/13/16  8:01 PM  Result Value Ref Range   Preg Test, Ur NEGATIVE NEGATIVE    Comment:        THE SENSITIVITY OF THIS METHODOLOGY IS >20 mIU/mL.   Basic metabolic panel     Status: Abnormal   Collection Time: 08/14/16  4:28 AM  Result Value Ref Range   Sodium 140 135 - 145 mmol/L   Potassium 4.0 3.5 - 5.1 mmol/L   Chloride 109 101 - 111 mmol/L   CO2 23  22 - 32 mmol/L   Glucose, Bld 131 (H) 65 - 99 mg/dL   BUN 7 6 - 20 mg/dL   Creatinine, Ser 0.64 0.44 - 1.00 mg/dL   Calcium 8.9 8.9 - 10.3 mg/dL   GFR calc non Af Amer >60 >60 mL/min   GFR calc Af Amer >60 >60 mL/min    Comment: (NOTE) The eGFR has been calculated using the CKD EPI equation. This calculation has not been validated in all clinical situations. eGFR's persistently <60 mL/min signify possible Chronic Kidney Disease.    Anion gap 8 5 - 15  CBC     Status: Abnormal   Collection Time: 08/14/16  4:28 AM  Result Value Ref Range   WBC 6.2 4.0 - 10.5 K/uL   RBC 3.81 (L) 3.87 - 5.11 MIL/uL   Hemoglobin 10.8 (L) 12.0 - 15.0 g/dL   HCT 34.3 (L) 36.0 - 46.0 %   MCV 90.0 78.0 - 100.0 fL   MCH 28.3 26.0 - 34.0 pg   MCHC 31.5 30.0 - 36.0 g/dL   RDW 14.3 11.5 - 15.5 %   Platelets 230 150 - 400 K/uL  Hepatic function panel     Status: Abnormal   Collection Time: 08/14/16  4:28 AM  Result Value Ref Range   Total Protein 5.6 (L) 6.5 - 8.1 g/dL   Albumin 3.0 (L) 3.5 - 5.0 g/dL   AST 22 15 - 41 U/L   ALT 14 14 - 54 U/L   Alkaline Phosphatase 71 38 - 126 U/L   Total Bilirubin 0.5 0.3 - 1.2 mg/dL   Bilirubin, Direct 0.1 0.1 - 0.5 mg/dL   Indirect Bilirubin 0.4 0.3 - 0.9 mg/dL   US Abdomen Complete  Result Date: 08/13/2016 CLINICAL DATA:  Initial  evaluation for acute abdominal pain for 5 days. EXAM: ABDOMEN ULTRASOUND COMPLETE COMPARISON:  Prior CT from 04/08/2015. FINDINGS: Gallbladder: No echogenic stones seen within the gallbladder lumen. Gallbladder wall minimally thickened to 3.7 mm, most likely related to partial contraction. No free pericholecystic fluid. A positive sonographic Murphy sign was elicited on exam. Common bile duct: Diameter: 4.5 mm Liver: Mildly heterogeneous without focal lesion. Within normal limits for echogenicity. IVC: No abnormality visualized. Pancreas: Visualized portion unremarkable. Spleen: Size and appearance within normal limits. Right Kidney: Length:  11.5 cm. Echogenicity within normal limits. No mass or hydronephrosis visualized. Left Kidney: Length: 10.7 cm. Echogenicity within normal limits. No mass or hydronephrosis visualized. Abdominal aorta: No aneurysm visualized. Other findings: None. IMPRESSION: 1. Positive sonographic Murphy sign without cholelithiasis or additional sonographic features to suggest acute cholecystitis. 2. No other acute abnormality within the abdomen. Electronically Signed   By: Jeannine Boga M.D.   On: 08/13/2016 22:39   Ct Abdomen Pelvis W Contrast  Result Date: 08/14/2016 CLINICAL DATA:  Acute onset of right upper quadrant abdominal pain and flank pain. Nausea and vomiting. Initial encounter. EXAM: CT ABDOMEN AND PELVIS WITH CONTRAST TECHNIQUE: Multidetector CT imaging of the abdomen and pelvis was performed using the standard protocol following bolus administration of intravenous contrast. CONTRAST:  149m ISOVUE-300 IOPAMIDOL (ISOVUE-300) INJECTION 61% COMPARISON:  CT of the abdomen and pelvis from 03/23/2015, and abdominal ultrasound performed earlier today at 10:03 p.m. FINDINGS: Lower chest: The visualized lung bases are grossly clear. The visualized portions of the mediastinum are unremarkable. Hepatobiliary: The liver is unremarkable in appearance. There is diffuse gallbladder wall thickening, with mild surrounding soft tissue inflammation, concerning for acute cholecystitis. The common bile duct remains normal in caliber. Pancreas: The pancreas is within normal limits. Spleen: The spleen is unremarkable in appearance. Adrenals/Urinary Tract: The adrenal glands are unremarkable in appearance. The kidneys are within normal limits. There is no evidence of hydronephrosis. No renal or ureteral stones are identified. No perinephric stranding is seen. Stomach/Bowel: The stomach is unremarkable in appearance. The small bowel is within normal limits. The appendix is normal in caliber, without evidence of appendicitis. A  diverticulum is noted at the ascending colon, and mild diverticulosis is noted along the distal descending colon, without evidence of diverticulitis. Vascular/Lymphatic: The abdominal aorta is unremarkable in appearance. The inferior vena cava is grossly unremarkable. No retroperitoneal lymphadenopathy is seen. No pelvic sidewall lymphadenopathy is identified. Reproductive: The bladder is mildly distended and within normal limits. The uterus is grossly unremarkable in appearance. The ovaries are relatively symmetric. No suspicious adnexal masses are seen. The patient is status post bilateral tubal ligation. Other: No additional soft tissue abnormalities are seen. Musculoskeletal: No acute osseous abnormalities are identified. The visualized musculature is unremarkable in appearance. IMPRESSION: 1. Diffuse gallbladder wall thickening, with mild surrounding soft inflammation, concerning for acute cholecystitis. 2. Mild diverticulosis at the distal descending colon and at the ascending colon, without evidence of diverticulitis. Electronically Signed   By: JGarald BaldingM.D.   On: 08/14/2016 00:31    ROS Constitutional: Negative for chills and fever.  Respiratory: Negative for cough, chest tightness and shortness of breath.   Cardiovascular: Negative for chest pain, palpitations and leg swelling.  Gastrointestinal: Positive for abdominal pain, nausea and vomiting. Negative for blood in stool and diarrhea.  Genitourinary: Negative for dysuria, flank pain, pelvic pain, vaginal bleeding, vaginal discharge and vaginal pain.  Musculoskeletal: Negative for arthralgias, myalgias, neck pain and neck stiffness.  Skin: Negative for rash.  Neurological: Negative for dizziness, weakness and headaches.  All other systems reviewed and are negative.  Blood pressure 127/86, pulse 85, temperature 98.2 F (36.8 C), temperature source Oral, resp. rate 18, SpO2 100 %. Physical Exam  Constitutional: She is oriented to  person, place, and time. She appears well-developed and well-nourished. No distress.  HENT:  Head: Normocephalic.  Eyes: Conjunctivae are normal.  Neck: Neck supple.  Cardiovascular: Normal rate, regular rhythm and normal heart sounds.   Pulmonary/Chest: Effort normal and breath sounds normal. No respiratory distress. She has no wheezes. She has no rales.  Abdominal: Soft. Bowel sounds are normal. She exhibits no distension. There is tenderness. There is no rebound.  Right upper quadrant tenderness. Positive Murphy's sign  Musculoskeletal: She exhibits no edema.  Neurological: She is alert and oriented to person, place, and time.  Skin: Skin is warm and dry.  Psychiatric: She has a normal mood and affect. Her behavior is normal.   Assessment/Plan 1.  Apparent acute acalculous cholecystitis 2.  Anticoagulated on Xarelto- although patient states that she has not taken Xarelto in 3 weeks in preparation for dental procedure 3.  Significant cardiac disease  Recs:  Hold anticoagulation Cardiac clearance for possible surgery NPO for possible surgery today. IV antibiotics for cholecystitis  Danee Soller K., MD 08/14/2016, 6:17 AM

## 2016-08-14 NOTE — Anesthesia Preprocedure Evaluation (Signed)
Anesthesia Evaluation  Patient identified by MRN, date of birth, ID band Patient awake    Reviewed: Allergy & Precautions, NPO status , reviewed documented beta blocker date and time   History of Anesthesia Complications Negative for: history of anesthetic complications  Airway Mallampati: II  TM Distance: >3 FB Neck ROM: Full    Dental  (+) Teeth Intact, Dental Advisory Given   Pulmonary Current Smoker,    Pulmonary exam normal        Cardiovascular hypertension, + CAD, + Past MI and + Cardiac Stents  Normal cardiovascular exam     Neuro/Psych PSYCHIATRIC DISORDERS Anxiety Depression TIACVA, Residual Symptoms    GI/Hepatic Neg liver ROS, hiatal hernia,   Endo/Other  negative endocrine ROS  Renal/GU negative Renal ROS     Musculoskeletal   Abdominal   Peds  Hematology  (+) anemia ,   Anesthesia Other Findings   Reproductive/Obstetrics                             Anesthesia Physical  Anesthesia Plan  ASA: III  Anesthesia Plan:    Post-op Pain Management:    Induction: Intravenous  Airway Management Planned: Oral ETT  Additional Equipment:   Intra-op Plan:   Post-operative Plan: Extubation in OR  Informed Consent: I have reviewed the patients History and Physical, chart, labs and discussed the procedure including the risks, benefits and alternatives for the proposed anesthesia with the patient or authorized representative who has indicated his/her understanding and acceptance.   Dental advisory given  Plan Discussed with: CRNA, Anesthesiologist and Surgeon  Anesthesia Plan Comments:         Anesthesia Quick Evaluation

## 2016-08-14 NOTE — H&P (Signed)
History and Physical    Lindsey [name]:[id number] DOB: Oct 26, 1970 DOA: 08/13/2016  PCP: [facility]  Patient coming from: Home.  Chief Complaint: Abdominal pain.  HPI: Lindsey [name] is a 46 y.o. female with history of CAD status post stenting, apical mural thrombus, history of TIA, hypertension and anemia presents to the ER with complaints of abdominal pain. Patient has been having gradually worsening right upper quadrant pain over the last 3-4 days. Abdominal pain is constant and not related to food and has been having associated nausea. Denies any fever chills chest pain or shortness of breath.   ED Course: CT of the abdomen and pelvis done in the ER is concerning for acute cholecystitis. On-call general surgeon Dr. Gwinda Passe has been consulted. Patient is being admitted for further management. Patient at this time only takes xarelto and no other medications. Patient has not taken her xarelto last 3 weeks since patient is having dental procedure done. Until a week ago patient used to be on penicillin for tooth infection.  Review of Systems: As per HPI, rest all negative.   Past Medical History:  Diagnosis Date  . Anemia   . Antithrombin III deficiency (HCC)    ?pt not sure if true diagnosis  . Anxiety   . Apical mural thrombus   . Blood transfusion   . Coronary artery disease    a. apical LAD infarction '00. b. NSTEMI s/p BMS to prox LAD '09. c. Cath 01/2015: stable LAD stent, otherwise minimal nonobstructive CAD.  Marland Kitchen GERD (gastroesophageal reflux disease)   . Hyperlipidemia   . Hypertension   . Myocardial infarction (HCC)   . Noncompliance with medication regimen    a. h/o noncompliance with med regimen (previous running out of Coumadin).  . Stroke (HCC)    assoc with short term memory loss and right peripheral vision loss; age 65   . TIA (transient ischemic attack) 2010  . Tobacco abuse     Past Surgical History:  Procedure Laterality Date  .  CARDIAC CATHETERIZATION    . CARDIAC CATHETERIZATION N/A 01/29/2015   Procedure: Left Heart Cath and Coronary Angiography;  Surgeon: Peter M Swaziland, MD;  Location: [facility] INVASIVE CV LAB;  Service: Cardiovascular;  Laterality: N/A;  . COLONOSCOPY N/A 03/29/2014   Procedure: COLONOSCOPY;  Surgeon: Louis Meckel, MD;  Location: [facility] ENDOSCOPY;  Service: Endoscopy;  Laterality: N/A;  . CORONARY ANGIOPLASTY    . ESOPHAGOGASTRODUODENOSCOPY N/A 06/09/2012   Procedure: ESOPHAGOGASTRODUODENOSCOPY (EGD);  Surgeon: Theda Belfast, MD;  Location: [facility] ENDOSCOPY;  Service: Endoscopy;  Laterality: N/A;  . ESOPHAGOGASTRODUODENOSCOPY N/A 08/02/2013   Procedure: ESOPHAGOGASTRODUODENOSCOPY (EGD);  Surgeon: Meryl Dare, MD;  Location: [facility] ENDOSCOPY;  Service: Endoscopy;  Laterality: N/A;  . LEFT HEART CATHETERIZATION WITH CORONARY ANGIOGRAM N/A 12/24/2011   Procedure: LEFT HEART CATHETERIZATION WITH CORONARY ANGIOGRAM;  Surgeon: Kathleene Hazel, MD;  Location: [facility] CATH LAB;  Service: Cardiovascular;  Laterality: N/A;  . TUBAL LIGATION       reports that she quit smoking about 20 months ago. Her smoking use included Cigarettes. She has a 0.20 pack-year smoking history. She has never used smokeless tobacco. She reports that she drinks alcohol. She reports that she uses drugs, including Cocaine.  No Known Allergies  Family History  Problem Relation Age of Onset  . Heart disease Brother        arrhythmia; died  . Breast cancer Maternal Aunt     Prior to Admission medications  Medication Sig Start Date End Date Taking? Authorizing Provider  acetaminophen (TYLENOL) 500 MG tablet Take 1,000 mg by mouth every 6 (six) hours as needed for headache (pain).   Yes [provider]  nitroGLYCERIN (NITROSTAT) 0.4 MG SL tablet Place 1 tablet (0.4 mg total) under the tongue every 5 (five) minutes as needed for chest pain (up to 3 doses). 10/14/13  Yes Weaver, Scott T, PA-C  rivaroxaban (XARELTO) 20 MG TABS tablet  Take 1 tablet (20 mg total) by mouth daily with supper. 06/08/15  Yes Kilroy, Luke K, PA-C  ALPRAZolam (XANAX) 0.25 MG tablet Take 1 tablet (0.25 mg total) by mouth at bedtime as needed for anxiety or sleep. Patient not taking: Reported on 08/14/2016 03/28/15   Ellsworth Lennox, PA-C  atorvastatin (LIPITOR) 40 MG tablet Take 1 tablet (40 mg total) by mouth daily. Patient not taking: Reported on 08/14/2016 02/01/15   Darreld Mclean, MD  carvedilol (COREG) 25 MG tablet Take 1 tablet (25 mg total) by mouth 2 (two) times daily with a meal. Patient not taking: Reported on 08/14/2016 03/28/15   Ellsworth Lennox, PA-C  clopidogrel (PLAVIX) 75 MG tablet Take 1 tablet (75 mg total) by mouth daily. Patient not taking: Reported on 08/14/2016 03/28/15   Ellsworth Lennox, PA-C  HYDROcodone-acetaminophen (NORCO/VICODIN) 5-325 MG tablet Take 1-2 tablets by mouth every 6 (six) hours as needed for moderate pain. Patient not taking: Reported on 03/10/2016 07/21/15   Cathren Laine, MD  hydrocortisone (ANUSOL-HC) 2.5 % rectal cream Apply rectally 2 times daily prn Patient not taking: Reported on 08/14/2016 07/21/15   Cathren Laine, MD  hydrocortisone-pramoxine [facility]) rectal foam Place 1 applicator rectally 2 (two) times daily. Patient not taking: Reported on 08/14/2016 06/15/14   Devoria Albe, MD  ondansetron (ZOFRAN ODT) 8 MG disintegrating tablet Take 1 tablet (8 mg total) by mouth every 8 (eight) hours as needed for nausea or vomiting. Patient not taking: Reported on 08/14/2016 03/10/16   Linwood Dibbles, MD  ondansetron (ZOFRAN) 4 MG tablet Take 1 tablet (4 mg total) by mouth every 6 (six) hours. Patient not taking: Reported on 08/14/2016 08/09/15   Jerelyn Scott, MD  pantoprazole (PROTONIX) 40 MG tablet Take 1 tablet (40 mg total) by mouth daily. Patient not taking: Reported on 08/14/2016 02/01/15   Darreld Mclean, MD  promethazine (PHENERGAN) 25 MG tablet Take 1 tablet (25 mg total) by mouth every 6 (six) hours as  needed for nausea or vomiting. Patient not taking: Reported on 08/14/2016 03/31/16   [address], [place], [place]    Physical Exam: Vitals:   08/13/16 2330 08/14/16 0039 08/14/16 0045 08/14/16 0130  BP: (!) 132/100 125/81 123/85 118/75  Pulse: 77 81 77 67  Resp:  16  13  Temp:      TempSrc:      SpO2: 100% 100% 97% 99%      Constitutional: Moderately built and nourished. Vitals:   08/13/16 2330 08/14/16 0039 08/14/16 0045 08/14/16 0130  BP: (!) 132/100 125/81 123/85 118/75  Pulse: 77 81 77 67  Resp:  16  13  Temp:      TempSrc:      SpO2: 100% 100% 97% 99%   Eyes: Anicteric no pallor. ENMT: No discharge from the ears eyes nose and mouth. Neck: No mass felt. No JVD appreciated. Respiratory: No rhonchi or crepitations. Cardiovascular: S1-S2 heard no murmurs appreciated. Abdomen: Right upper quadrant tenderness. No guarding or rigidity. Musculoskeletal: No edema. No joint effusion. Skin: No rash. Skin appears  warm. Neurologic: Alert awake oriented to time place and person. Moves all extremities. Psychiatric: Appears normal. Normal affect.   Labs on Admission: I have personally reviewed following labs and imaging studies  CBC:  Recent Labs Lab 08/07/16 1149 08/13/16 1439  WBC 6.9 8.1  NEUTROABS 4.1  --   HGB 11.1* 12.2  HCT 34.2* 38.5  MCV 89.3 89.3  PLT 228 243   Basic Metabolic Panel:  Recent Labs Lab 08/07/16 1149 08/13/16 1439  NA 142 139  K 4.0 3.9  CL 109 105  CO2 24 25  GLUCOSE 90 126*  BUN 6 7  CREATININE 0.80 0.76  CALCIUM 9.4 9.6   GFR: Estimated Creatinine Clearance: 88.4 mL/min (by C-G formula based on SCr of 0.76 mg/dL). Liver Function Tests:  Recent Labs Lab 08/13/16 1439  AST 20  ALT 11*  ALKPHOS 77  BILITOT 0.5  PROT 6.7  ALBUMIN 3.5    Recent Labs Lab 08/13/16 1439  LIPASE 24   No results for input(s): AMMONIA in the last 168 hours. Coagulation Profile: No results for input(s): INR, PROTIME in the last 168 hours. Cardiac  Enzymes: No results for input(s): CKTOTAL, CKMB, CKMBINDEX, TROPONINI in the last 168 hours. BNP (last 3 results) No results for input(s): PROBNP in the last 8760 hours. HbA1C: No results for input(s): HGBA1C in the last 72 hours. CBG: No results for input(s): GLUCAP in the last 168 hours. Lipid Profile: No results for input(s): CHOL, HDL, LDLCALC, TRIG, CHOLHDL, LDLDIRECT in the last 72 hours. Thyroid Function Tests: No results for input(s): TSH, T4TOTAL, FREET4, T3FREE, THYROIDAB in the last 72 hours. Anemia Panel: No results for input(s): VITAMINB12, FOLATE, FERRITIN, TIBC, IRON, RETICCTPCT in the last 72 hours. Urine analysis:    Component Value Date/Time   COLORURINE YELLOW 08/13/2016 2001   APPEARANCEUR HAZY (A) 08/13/2016 2001   APPEARANCEUR Cloudy (A) 03/06/2015 1525   LABSPEC 1.019 08/13/2016 2001   PHURINE 5.0 08/13/2016 2001   GLUCOSEU NEGATIVE 08/13/2016 2001   HGBUR NEGATIVE 08/13/2016 2001   BILIRUBINUR NEGATIVE 08/13/2016 2001   BILIRUBINUR Small 03/06/2015 1534   BILIRUBINUR Negative 03/06/2015 1525   KETONESUR NEGATIVE 08/13/2016 2001   PROTEINUR NEGATIVE 08/13/2016 2001   UROBILINOGEN 1.0 03/06/2015 1534   UROBILINOGEN 1.0 12/20/2014 2232   NITRITE NEGATIVE 08/13/2016 2001   LEUKOCYTESUR NEGATIVE 08/13/2016 2001   LEUKOCYTESUR Negative 03/06/2015 1525   Sepsis Labs: @LABRCNTIP (procalcitonin:4,lacticidven:4) )No results found for this or any previous visit (from the past 240 hour(s)).   Radiological Exams on Admission: US Abdomen Complete  Result Date: 08/13/2016 CLINICAL DATA:  Initial evaluation for acute abdominal pain for 5 days. EXAM: ABDOMEN ULTRASOUND COMPLETE COMPARISON:  Prior CT from 04/08/2015. FINDINGS: Gallbladder: No echogenic stones seen within the gallbladder lumen. Gallbladder wall minimally thickened to 3.7 mm, most likely related to partial contraction. No free pericholecystic fluid. A positive sonographic Murphy sign was elicited on exam.  Common bile duct: Diameter: 4.5 mm Liver: Mildly heterogeneous without focal lesion. Within normal limits for echogenicity. IVC: No abnormality visualized. Pancreas: Visualized portion unremarkable. Spleen: Size and appearance within normal limits. Right Kidney: Length: 11.5 cm. Echogenicity within normal limits. No mass or hydronephrosis visualized. Left Kidney: Length: 10.7 cm. Echogenicity within normal limits. No mass or hydronephrosis visualized. Abdominal aorta: No aneurysm visualized. Other findings: None. IMPRESSION: 1. Positive sonographic Murphy sign without cholelithiasis or additional sonographic features to suggest acute cholecystitis. 2. No other acute abnormality within the abdomen. Electronically Signed   By: Janell Quiet.D.  On: 08/13/2016 22:39   Ct Abdomen Pelvis W Contrast  Result Date: 08/14/2016 CLINICAL DATA:  Acute onset of right upper quadrant abdominal pain and flank pain. Nausea and vomiting. Initial encounter. EXAM: CT ABDOMEN AND PELVIS WITH CONTRAST TECHNIQUE: Multidetector CT imaging of the abdomen and pelvis was performed using the standard protocol following bolus administration of intravenous contrast. CONTRAST:  ISOVUE-300 IOPAMIDOL (ISOVUE-300) INJECTION 61% COMPARISON:  CT of the abdomen and pelvis from 03/23/2015, and abdominal ultrasound performed earlier today at 10:03 p.m. FINDINGS: Lower chest: The visualized lung bases are grossly clear. The visualized portions of the mediastinum are unremarkable. Hepatobiliary: The liver is unremarkable in appearance. There is diffuse gallbladder wall thickening, with mild surrounding soft tissue inflammation, concerning for acute cholecystitis. The common bile duct remains normal in caliber. Pancreas: The pancreas is within normal limits. Spleen: The spleen is unremarkable in appearance. Adrenals/Urinary Tract: The adrenal glands are unremarkable in appearance. The kidneys are within normal limits. There is no  evidence of hydronephrosis. No renal or ureteral stones are identified. No perinephric stranding is seen. Stomach/Bowel: The stomach is unremarkable in appearance. The small bowel is within normal limits. The appendix is normal in caliber, without evidence of appendicitis. A diverticulum is noted at the ascending colon, and mild diverticulosis is noted along the distal descending colon, without evidence of diverticulitis. Vascular/Lymphatic: The abdominal aorta is unremarkable in appearance. The inferior vena cava is grossly unremarkable. No retroperitoneal lymphadenopathy is seen. No pelvic sidewall lymphadenopathy is identified. Reproductive: The bladder is mildly distended and within normal limits. The uterus is grossly unremarkable in appearance. The ovaries are relatively symmetric. No suspicious adnexal masses are seen. The patient is status post bilateral tubal ligation. Other: No additional soft tissue abnormalities are seen. Musculoskeletal: No acute osseous abnormalities are identified. The visualized musculature is unremarkable in appearance. IMPRESSION: 1. Diffuse gallbladder wall thickening, with mild surrounding soft inflammation, concerning for acute cholecystitis. 2. Mild diverticulosis at the distal descending colon and at the ascending colon, without evidence of diverticulitis. Electronically Signed   By: Roanna Raider M.D.   On: 08/14/2016 00:31     Assessment/Plan Principal Problem:   Acute cholecystitis Active Problems:   Essential hypertension   Antithrombin III deficiency (HCC)   Apical mural thrombus   History of ischemic left PCA stroke    1. Acute cholecystitis - patient will be placed nothing by mouth and IV antibiotics. I have requested cardiology consult for surgical clearance as requested by general surgery. IV fluids and pain medications. 2. History of apical mural thrombus - patient has not taken has order for last 3 weeks due to dental procedure. 3. History of CAD  status post stenting last cardiac cath in 2016 - denies any chest pain. Patient only takes xarelto which is on hold for dental procedure for last 3 weeks. 4. Anemia normocytic normochromic - follow CBC.   DVT prophylaxis: SCDs. Code Status: Full code.  Family Communication: Discussed with patient.  Disposition Plan: Home.  Consults called: General surgery and cardiology.  Admission status: Inpatient.    Eduard Clos MD [facility] Pager [phone].  If 7PM-7AM, please contact night-coverage [email or web address] Password [email or web address]  08/14/2016, 1:38 AM

## 2016-08-14 NOTE — Transfer of Care (Signed)
Immediate Anesthesia Transfer of Care Note  Patient: Lindsey [name]  Procedure(s) Performed: Procedure(s): LAPAROSCOPIC CHOLECYSTECTOMY (N/A)  Patient Location: PACU  Anesthesia Type:General  Level of Consciousness: awake, alert  and oriented  Airway & Oxygen Therapy: Patient Spontanous Breathing  Post-op Assessment: Report given to RN, Post -op Vital signs reviewed and stable and Patient moving all extremities X 4  Post vital signs: Reviewed and stable  Last Vitals:  Vitals:   08/14/16 1329 08/14/16 1549  BP: (!) 114/94   Pulse: 82   Resp: 19   Temp: 36.8 C 36.7 C    Last Pain:  Vitals:   08/14/16 1329  TempSrc: Oral  PainSc:       Patients Stated Pain Goal: 0 (08/14/16 0911)  Complications: No apparent anesthesia complications

## 2016-08-14 NOTE — Anesthesia Postprocedure Evaluation (Signed)
Anesthesia Post Note  Patient: Lindsey [name]  Procedure(s) Performed: Procedure(s) (LRB): LAPAROSCOPIC CHOLECYSTECTOMY (N/A)     Patient location during evaluation: PACU Anesthesia Type: General Level of consciousness: awake and alert Pain management: pain level controlled Vital Signs Assessment: post-procedure vital signs reviewed and stable Respiratory status: spontaneous breathing, nonlabored ventilation and respiratory function stable Cardiovascular status: blood pressure returned to baseline and stable Postop Assessment: no signs of nausea or vomiting Anesthetic complications: no    Last Vitals:  Vitals:   08/14/16 1730 08/14/16 1808  BP: (!) 165/94 (!) 158/103  Pulse: 87 96  Resp: 14 16  Temp: 36.2 C 37 C    Last Pain:  Vitals:   08/14/16 1808  TempSrc: Oral  PainSc:                  Lowella Curb

## 2016-08-14 NOTE — Progress Notes (Signed)
I have seen and assessed patient and agree with Dr.Kakrakandy's assessment and plan. Patient is a 46 year old female history of coronary artery disease status post stenting, apical thrombus on chronic anticoagulation, history of anti-thrombin III deficiency per patient, history of TIAs, hypertension, anemia presented with abdominal pain. CT abdomen and pelvis done presented for acute cholecystitis. Patient seen in consultation by general surgery were recommending laparoscopic cholecystectomy. Cardiology consultation pending for preop clearance. Continue IV antibiotics. Postoperatively general surgery to advise when patient could be resumed on anticoagulation.  No charge.

## 2016-08-14 NOTE — Progress Notes (Signed)
Pharmacy Antibiotic Note  Lindsey [name] is a 46 y.o. female admitted on 08/13/2016 with intra-abd infection.  Pharmacy has been consulted for Zosyn dosing.  Plan: Zosyn 3.375gm IV now over 30 min then 3.375gm IV q8h - subsequent doses over 4 hours Will f/u micro data, renal function, and pt's clinical condition      Temp (24hrs), Avg:98.2 F (36.8 C), Min:98.2 F (36.8 C), Max:98.2 F (36.8 C)   Recent Labs Lab 08/07/16 1149 08/13/16 1439  WBC 6.9 8.1  CREATININE 0.80 0.76    Estimated Creatinine Clearance: 88.4 mL/min (by C-G formula based on SCr of 0.76 mg/dL).    No Known Allergies  Antimicrobials this admission: 5/31 Rocephin x 1 5/31 Zosyn >>   Thank you for allowing pharmacy to be a part of this patient's care.  Christoper Fabian, PharmD, BCPS Clinical pharmacist, pager [phone] 08/14/2016 1:43 AM

## 2016-08-14 NOTE — Consult Note (Signed)
Cardiology Consultation:   Patient ID: Lindsey [name]; [id number]; June 13, 1970   Admit date: 08/13/2016 Date of Consult: 08/14/2016  Primary Care Provider: [facility] Primary Cardiologist: Dr. Jens Som Primary Electrophysiologist:  None   Patient Profile:   Lindsey [name] is a 46 y.o. female with a hx of CAD (apical infarct 2000, NSTEMI 2009 s/p BMS to pLAD), apical mural thrombus , TIA (2010), possible ATIII Deficiency, HTN, HLD, & prior tobacco use  who is being seen today for the evaluation of preoperative cardiac clearance for a cholecystectomy at the request of Dr. Isla Pence.  History of Present Illness:   Lindsey [name] has a complex medical history, in summary:  02/01/15: She had troponin elevation with a peak value of 0.76. Cardiac cath demonstrated a stable LAD stent, otherwise minimal nonobstructive CAD and thrombus 1.5 x 2 cm in the LV apex, confirmed by echo. She was started on Isosorbide mononitrate & Ranolazine.  03/24/15:   Troponin elevation to 1.06. Her pain was felt to be musculoskeletal. Carvedilol was increased from 12.5 mg BID to 25mg  BID, & her Imdur from 15 mg daily to 30 mg daily. Her Ranexa was discontinued. However, Dr. Jens Som re-initiated this on an outpatient basis shortly thereafter.2D Echo 03/26/15: EF 50% with apical akinesis, mural thrombus again noted at apex, grade 1 DD, no effusion.   She is on life long anticoagulation therapy for apical mural thrombus,  history of CVA and hx of antithrombin 3 deficiency. She is historically non complaint  per chart review with taking her anticoagulation. She has not taken her Xarelto the past 3 weeks due to a dental procedure. She presented to the emergency department on 5/30l2018 with abdominal pain and her work-up revealed cholecystitis. She is being evaluated for preoperative clearance for, presumably, a lap-chole.   She has not been experiencing any chest pain or SOB and rest and  is able to walk across the parking lot of up and down stairs at her home without SOB, fatigue or CP.   Past Medical History:  Diagnosis Date  . Anemia   . Antithrombin III deficiency (HCC)    ?pt not sure if true diagnosis  . Anxiety   . Apical mural thrombus   . Blood transfusion   . Cholecystitis 07/2016  . Coronary artery disease    a. apical LAD infarction '00. b. NSTEMI s/p BMS to prox LAD '09. c. Cath 01/2015: stable LAD stent, otherwise minimal nonobstructive CAD.  Marland Kitchen GERD (gastroesophageal reflux disease)   . Hyperlipidemia   . Hypertension   . Myocardial infarction (HCC)   . Noncompliance with medication regimen    a. h/o noncompliance with med regimen (previous running out of Coumadin).  . Stroke (HCC)    assoc with short term memory loss and right peripheral vision loss; age 26   . TIA (transient ischemic attack) 2010  . Tobacco abuse     Past Surgical History:  Procedure Laterality Date  . CARDIAC CATHETERIZATION    . CARDIAC CATHETERIZATION N/A 01/29/2015   Procedure: Left Heart Cath and Coronary Angiography;  Surgeon: Peter M Swaziland, MD;  Location: [facility] INVASIVE CV LAB;  Service: Cardiovascular;  Laterality: N/A;  . COLONOSCOPY N/A 03/29/2014   Procedure: COLONOSCOPY;  Surgeon: Louis Meckel, MD;  Location: [facility] ENDOSCOPY;  Service: Endoscopy;  Laterality: N/A;  . CORONARY ANGIOPLASTY    . ESOPHAGOGASTRODUODENOSCOPY N/A 06/09/2012   Procedure: ESOPHAGOGASTRODUODENOSCOPY (EGD);  Surgeon: Theda Belfast, MD;  Location: [facility] ENDOSCOPY;  Service: Endoscopy;  Laterality: N/A;  . ESOPHAGOGASTRODUODENOSCOPY N/A 08/02/2013   Procedure: ESOPHAGOGASTRODUODENOSCOPY (EGD);  Surgeon: Meryl Dare, MD;  Location[facility] ENDOSCOPY;  Service: Endoscopy;  Laterality: N/A;  . LEFT HEART CATHETERIZATION WITH CORONARY ANGIOGRAM N/A 12/24/2011   Procedure: LEFT HEART CATHETERIZATION WITH CORONARY ANGIOGRAM;  Surgeon: Kathleene Hazel, MD;  Location: [facility] CATH LAB;  Service: Cardiovascular;   Laterality: N/A;  . TUBAL LIGATION       Inpatient Medications: Scheduled Meds:  Continuous Infusions: . dextrose 5 % and 0.9% NaCl 75 mL/hr at 08/14/16 0212  . piperacillin-tazobactam (ZOSYN)  IV     PRN Meds: acetaminophen **OR** acetaminophen, morphine injection, nitroGLYCERIN, ondansetron **OR** ondansetron (ZOFRAN) IV  Allergies:   No Known Allergies  Social History:   Social History   Social History  . Marital status: Divorced    Spouse name: N/A  . Number of children: 2  . Years of education: N/A   Occupational History  . Writes grants for [employer]    Social History Main Topics  . Smoking status: Current Every Day Smoker    Packs/day: 0.20    Years: 1.00    Types: Cigarettes  . Smokeless tobacco: Never Used     Comment: 2018  " i STILL SMOKE 3  CIGARETTES A DAY "  . Alcohol use 0.0 oz/week     Comment: Social drinker  . Drug use: Yes    Types: Cocaine     Comment: hx of use x 1  . Sexual activity: No   Other Topics Concern  . Not on file   Social History Narrative  . No narrative on file    Family History:   The patient's family history includes Breast cancer in her maternal aunt; Heart disease in her brother.  ROS:  Please see the history of present illness.  All other ROS reviewed and negative.     Physical Exam/Data:   Vitals:   08/14/16 0045 08/14/16 0130 08/14/16 0234 08/14/16 0607  BP: 123/85 118/75 (!) 135/96 127/86  Pulse: 77 67 66 85  Resp:  13 18 18   Temp:   98.2 F (36.8 C) 98.2 F (36.8 C)  TempSrc:   Oral Oral  SpO2: 97% 99% 100% 100%    Intake/Output Summary (Last 24 hours) at 08/14/16 1006 Last data filed at 08/14/16 0636  Gross per 24 hour  Intake             1380 ml  Output                0 ml  Net             1380 ml   There were no vitals filed for this visit. There is no height or weight on file to calculate BMI.  General: Well developed, well nourished, in no acute distress. Head: Normocephalic,  atraumatic, sclera non-icteric, no xanthomas, nares are without discharge.  Neck: Negative for carotid bruits. JVD not elevated. Lungs: Clear bilaterally to auscultation without wheezes, rales, or rhonchi. Breathing is unlabored. Heart: RRR with S1 S2. No murmurs, rubs, or gallops appreciated. Abdomen: Soft,, non-distended with normoactive bowel sounds. No hepatomegaly. No rebound/guarding. No obvious abdominal masses. +abdominal tenderness. Msk:  Strength and tone appear normal for age. Extremities: No clubbing or cyanosis. No edema.  Distal pedal pulses are 2+ and equal bilaterally. Neuro: Alert and oriented X 3. No facial asymmetry. No focal deficit. Moves all extremities spontaneously. Psych:  Responds to questions appropriately with  a normal affect.  EKG:  The EKG was personally reviewed and demonstrates HR 79, SR  Relevant CV Studies:   Limited Transthoracic Echocardiogram 03/2015  Study Conclusions  - Left ventricle: LVEF is approximately 50% with apical akinesis.   Mural thrombus is again noted at apex. The cavity size was   normal. Wall thickness was normal. Doppler parameters are   consistent with abnormal left ventricular relaxation (grade 1   diastolic dysfunction).  Left Heart Cath and Coronary Angiography 01/2015   Conclusion    Prox RCA lesion, 20% stenosed.  Mid LAD to Dist LAD lesion, 20% stenosed.  There is mild left ventricular systolic dysfunction.   1. Minimal nonobstructive CAD. Patent stent in the LAD 2. Mild LV dysfunction with chronic akinesis of the apex. 3. Complex filling defect in the LV apex consistent with thrombus.  Plan: resume full anticoagulation. Will assess with Echo.     Laboratory Data:  Chemistry Recent Labs Lab 08/07/16 1149 08/13/16 1439 08/14/16 0428  NA 142 139 140  K 4.0 3.9 4.0  CL 109 105 109  CO2 24 25 23   GLUCOSE 90 126* 131*  BUN 6 7 7   CREATININE 0.80 0.76 0.64  CALCIUM 9.4 9.6 8.9  GFRNONAA >60 >60 >60    GFRAA >60 >60 >60  ANIONGAP 9 9 8      Recent Labs Lab 08/13/16 1439 08/14/16 0428  PROT 6.7 5.6*  ALBUMIN 3.5 3.0*  AST 20 22  ALT 11* 14  ALKPHOS 77 71  BILITOT 0.5 0.5   Hematology Recent Labs Lab 08/07/16 1149 08/13/16 1439 08/14/16 0428  WBC 6.9 8.1 6.2  RBC 3.83* 4.31 3.81*  HGB 11.1* 12.2 10.8*  HCT 34.2* 38.5 34.3*  MCV 89.3 89.3 90.0  MCH 29.0 28.3 28.3  MCHC 32.5 31.7 31.5  RDW 14.1 14.2 14.3  PLT 228 243 230   Cardiac EnzymesNo results for input(s): TROPONINI in the last 168 hours. No results for input(s): TROPIPOC in the last 168 hours.  BNP Recent Labs Lab 08/07/16 1149  BNP 39.5    DDimer No results for input(s): DDIMER in the last 168 hours.  Radiology/Studies:  US Abdomen Complete  Result Date: 08/13/2016 CLINICAL DATA:  Initial evaluation for acute abdominal pain for 5 days. EXAM: ABDOMEN ULTRASOUND COMPLETE COMPARISON:  Prior CT from 04/08/2015. FINDINGS: Gallbladder: No echogenic stones seen within the gallbladder lumen. Gallbladder wall minimally thickened to 3.7 mm, most likely related to partial contraction. No free pericholecystic fluid. A positive sonographic Murphy sign was elicited on exam. Common bile duct: Diameter: 4.5 mm Liver: Mildly heterogeneous without focal lesion. Within normal limits for echogenicity. IVC: No abnormality visualized. Pancreas: Visualized portion unremarkable. Spleen: Size and appearance within normal limits. Right Kidney: Length: 11.5 cm. Echogenicity within normal limits. No mass or hydronephrosis visualized. Left Kidney: Length: 10.7 cm. Echogenicity within normal limits. No mass or hydronephrosis visualized. Abdominal aorta: No aneurysm visualized. Other findings: None. IMPRESSION: 1. Positive sonographic Murphy sign without cholelithiasis or additional sonographic features to suggest acute cholecystitis. 2. No other acute abnormality within the abdomen. Electronically Signed   By: Rise Mu M.D.   On:  08/13/2016 22:39   Ct Abdomen Pelvis W Contrast  Result Date: 08/14/2016 CLINICAL DATA:  Acute onset of right upper quadrant abdominal pain and flank pain. Nausea and vomiting. Initial encounter. EXAM: CT ABDOMEN AND PELVIS WITH CONTRAST TECHNIQUE: Multidetector CT imaging of the abdomen and pelvis was performed using the standard protocol following bolus administration of intravenous contrast.  CONTRAST:  ISOVUE-300 IOPAMIDOL (ISOVUE-300) INJECTION 61% COMPARISON:  CT of the abdomen and pelvis from 03/23/2015, and abdominal ultrasound performed earlier today at 10:03 p.m. FINDINGS: Lower chest: The visualized lung bases are grossly clear. The visualized portions of the mediastinum are unremarkable. Hepatobiliary: The liver is unremarkable in appearance. There is diffuse gallbladder wall thickening, with mild surrounding soft tissue inflammation, concerning for acute cholecystitis. The common bile duct remains normal in caliber. Pancreas: The pancreas is within normal limits. Spleen: The spleen is unremarkable in appearance. Adrenals/Urinary Tract: The adrenal glands are unremarkable in appearance. The kidneys are within normal limits. There is no evidence of hydronephrosis. No renal or ureteral stones are identified. No perinephric stranding is seen. Stomach/Bowel: The stomach is unremarkable in appearance. The small bowel is within normal limits. The appendix is normal in caliber, without evidence of appendicitis. A diverticulum is noted at the ascending colon, and mild diverticulosis is noted along the distal descending colon, without evidence of diverticulitis. Vascular/Lymphatic: The abdominal aorta is unremarkable in appearance. The inferior vena cava is grossly unremarkable. No retroperitoneal lymphadenopathy is seen. No pelvic sidewall lymphadenopathy is identified. Reproductive: The bladder is mildly distended and within normal limits. The uterus is grossly unremarkable in appearance. The ovaries  are relatively symmetric. No suspicious adnexal masses are seen. The patient is status post bilateral tubal ligation. Other: No additional soft tissue abnormalities are seen. Musculoskeletal: No acute osseous abnormalities are identified. The visualized musculature is unremarkable in appearance. IMPRESSION: 1. Diffuse gallbladder wall thickening, with mild surrounding soft inflammation, concerning for acute cholecystitis. 2. Mild diverticulosis at the distal descending colon and at the ascending colon, without evidence of diverticulitis. Electronically Signed   By: Roanna Raider M.D.   On: 08/14/2016 00:31    Assessment and Plan:   1. Acute cholecystitis: pt being seen by surgery and medicine.  2. Coronary artery disease: Patient is at low to moderate cardiovascular risk for surgery. She has not been having CP or SOB at rest or with exertion. Regarding her mural thrombus and the use of longterm anticoagulation, we would favor restarting on Heparin when the surgical team feels her abdomen is stable (tonight of tomorrow, post op) and restart the Xarelto when the surgical team feels it is appropriate.  Suan Halter, PA-C  08/14/2016 10:06 AM   Attending Note:   The patient was seen and examined.  Agree with assessment and plan as noted above.  Changes made to the above note as needed.  Patient seen and independently examined with Marlon Pel, PA.   We discussed all aspects of the encounter. I agree with the assessment and plan as stated above.  1.   CAD :  Patient reports no recent angina.   She should be stable from a coronary standpoint for her cholecystectomy  2.  Mural thrombus:   Has a hx of apical thrombus that has been present for several years.   This thrombus is likely organized and is not likely to embolize.   She has been off her Xarelto for 3 weeks because she has had some dental issues and thought that she should stop it   She has ATIII deficiency and needs to be  restarted on either heparin or Xarelto as soon as the surgery team feels it is safe      I have spent a total of 40 minutes with patient reviewing hospital  notes , telemetry, EKGs, labs and examining patient as well as establishing an assessment and plan  that was discussed with the patient. > 50% of time was spent in direct patient care.    Vesta Mixer, Montez Hageman., MD, [facility] 08/14/2016, 12:27 PM [address]  [address] Office [phone] Pager [phone]

## 2016-08-14 NOTE — Progress Notes (Signed)
ANNAKAREN FADLEY is a 46 y.o. female patient admitted. Awake, alert - oriented  X 4 - no acute distress noted.  VSS - Blood pressure (!) 135/96, pulse 66, temperature 98.2 F (36.8 C), temperature source Oral, resp. rate 18, SpO2 100 %.    IV in place, occlusive dsg intact without redness.  Orientation to room, and floor completed.  Admission INP armband ID verified with patient/family, and in place.   SR up x 2, fall assessment complete, with patient and family able to verbalize understanding of risk associated with falls, and verbalized understanding to call nsg before up out of bed.  Call light within reach, patient able to voice, and demonstrate understanding. No evidence of skin break down noted on exam.      Will cont to eval and treat per MD orders.  Rolland Porter, RN 08/14/2016 2:49 AM

## 2016-08-14 NOTE — Interval H&P Note (Signed)
Pt seen & examined.  Imaging reviewed Agree with Dr Fatima Sanger eval Appears to be cholecystitis based on history, exam, imaging  rec iv abx and cholecystectomy Appreciate cards evaluation  I believe the patient's symptoms are consistent with gallbladder disease.  We discussed gallbladder disease.   I discussed laparoscopic cholecystectomy with possible IOC in detail.  The patient was shown diagrams detailing the procedure.  We discussed the risks and benefits of a laparoscopic cholecystectomy including, but not limited to bleeding, infection, injury to surrounding structures such as the intestine or liver, bile leak, retained gallstones, need to convert to an open procedure, prolonged diarrhea, blood clots such as  DVT, common bile duct injury, anesthesia risks, and possible need for additional procedures.  We discussed the typical post-operative recovery course. I explained that the likelihood of improvement of their symptoms is good.  B/c of her questionable h/o blood clotting d/o -->will give subcu heparin on call to OR.   All questions asked and answered  Mary Sella. Andrey Campanile, MD, FACS General, Bariatric, & Minimally Invasive Surgery [facility], [place]

## 2016-08-15 ENCOUNTER — Encounter (HOSPITAL_COMMUNITY): Payer: Self-pay | Admitting: General Surgery

## 2016-08-15 DIAGNOSIS — I2510 Atherosclerotic heart disease of native coronary artery without angina pectoris: Secondary | ICD-10-CM

## 2016-08-15 DIAGNOSIS — K810 Acute cholecystitis: Principal | ICD-10-CM

## 2016-08-15 DIAGNOSIS — E538 Deficiency of other specified B group vitamins: Secondary | ICD-10-CM

## 2016-08-15 LAB — CBC WITH DIFFERENTIAL/PLATELET
Basophils Absolute: 0 10*3/uL (ref 0.0–0.1)
Basophils Relative: 0 %
EOS ABS: 0 10*3/uL (ref 0.0–0.7)
Eosinophils Relative: 0 %
HCT: 36 % (ref 36.0–46.0)
Hemoglobin: 11.7 g/dL — ABNORMAL LOW (ref 12.0–15.0)
LYMPHS ABS: 1.5 10*3/uL (ref 0.7–4.0)
LYMPHS PCT: 10 %
MCH: 28.5 pg (ref 26.0–34.0)
MCHC: 32.5 g/dL (ref 30.0–36.0)
MCV: 87.6 fL (ref 78.0–100.0)
MONOS PCT: 4 %
Monocytes Absolute: 0.5 10*3/uL (ref 0.1–1.0)
Neutro Abs: 12.1 10*3/uL — ABNORMAL HIGH (ref 1.7–7.7)
Neutrophils Relative %: 86 %
Platelets: 248 10*3/uL (ref 150–400)
RBC: 4.11 MIL/uL (ref 3.87–5.11)
RDW: 13.9 % (ref 11.5–15.5)
WBC: 14.1 10*3/uL — AB (ref 4.0–10.5)

## 2016-08-15 LAB — GLUCOSE, CAPILLARY
GLUCOSE-CAPILLARY: 118 mg/dL — AB (ref 65–99)
GLUCOSE-CAPILLARY: 124 mg/dL — AB (ref 65–99)
GLUCOSE-CAPILLARY: 161 mg/dL — AB (ref 65–99)
Glucose-Capillary: 107 mg/dL — ABNORMAL HIGH (ref 65–99)
Glucose-Capillary: 117 mg/dL — ABNORMAL HIGH (ref 65–99)

## 2016-08-15 LAB — COMPREHENSIVE METABOLIC PANEL
ALBUMIN: 3.3 g/dL — AB (ref 3.5–5.0)
ALT: 20 U/L (ref 14–54)
AST: 27 U/L (ref 15–41)
Alkaline Phosphatase: 77 U/L (ref 38–126)
Anion gap: 10 (ref 5–15)
CHLORIDE: 104 mmol/L (ref 101–111)
CO2: 24 mmol/L (ref 22–32)
CREATININE: 0.64 mg/dL (ref 0.44–1.00)
Calcium: 9.6 mg/dL (ref 8.9–10.3)
GFR calc Af Amer: >60 mL/min (ref >60)
GFR calc non Af Amer: >60 mL/min (ref >60)
Glucose, Bld: 130 mg/dL — ABNORMAL HIGH (ref 65–99)
POTASSIUM: 3.7 mmol/L (ref 3.5–5.1)
SODIUM: 138 mmol/L (ref 135–145)
Total Bilirubin: 0.6 mg/dL (ref 0.3–1.2)
Total Protein: 6.6 g/dL (ref 6.5–8.1)

## 2016-08-15 MED ORDER — CYANOCOBALAMIN 1000 MCG/ML IJ SOLN
1000.0000 ug | Freq: Every day | INTRAMUSCULAR | Status: DC
  Administered 2016-08-15: 1000 ug via INTRAMUSCULAR
  Filled 2016-08-15 (×2): qty 1

## 2016-08-15 MED ORDER — ACETAMINOPHEN 325 MG PO TABS
650.0000 mg | ORAL_TABLET | Freq: Four times a day (QID) | ORAL | Status: DC
  Administered 2016-08-15 – 2016-08-16 (×5): 650 mg via ORAL
  Filled 2016-08-15 (×5): qty 2

## 2016-08-15 MED ORDER — ACETAMINOPHEN 500 MG PO TABS: 1000.0000 mg | ORAL_TABLET | Freq: Four times a day (QID) | ORAL | Status: DC | PRN

## 2016-08-15 MED ORDER — METHOCARBAMOL 500 MG PO TABS
500.0000 mg | ORAL_TABLET | Freq: Three times a day (TID) | ORAL | Status: DC
  Administered 2016-08-15 – 2016-08-16 (×4): 500 mg via ORAL
  Filled 2016-08-15 (×4): qty 1

## 2016-08-15 MED ORDER — CARVEDILOL 25 MG PO TABS
25.0000 mg | ORAL_TABLET | Freq: Two times a day (BID) | ORAL | Status: DC
  Administered 2016-08-15 – 2016-08-16 (×3): 25 mg via ORAL
  Filled 2016-08-15 (×3): qty 1

## 2016-08-15 MED ORDER — PIPERACILLIN-TAZOBACTAM 3.375 G IVPB 30 MIN: 3.3750 g | Freq: Three times a day (TID) | INTRAVENOUS | Status: DC

## 2016-08-15 MED ORDER — PIPERACILLIN-TAZOBACTAM 3.375 G IVPB 30 MIN
3.3750 g | Freq: Once | INTRAVENOUS | Status: AC
  Administered 2016-08-15: 3.375 g via INTRAVENOUS
  Filled 2016-08-15: qty 50

## 2016-08-15 MED ORDER — PANTOPRAZOLE SODIUM 40 MG PO TBEC
40.0000 mg | DELAYED_RELEASE_TABLET | Freq: Every day | ORAL | Status: DC
  Administered 2016-08-15 – 2016-08-16 (×2): 40 mg via ORAL
  Filled 2016-08-15 (×2): qty 1

## 2016-08-15 MED ORDER — PIPERACILLIN-TAZOBACTAM 3.375 G IVPB
3.3750 g | Freq: Three times a day (TID) | INTRAVENOUS | Status: DC
  Administered 2016-08-15 – 2016-08-16 (×3): 3.375 g via INTRAVENOUS
  Filled 2016-08-15 (×5): qty 50

## 2016-08-15 MED ORDER — RIVAROXABAN 20 MG PO TABS
20.0000 mg | ORAL_TABLET | Freq: Every day | ORAL | Status: DC
  Administered 2016-08-15: 20 mg via ORAL
  Filled 2016-08-15: qty 1

## 2016-08-15 NOTE — Care Management Note (Addendum)
Case Management Note  Patient Details  Name: Lindsey [name] MRN: [id number] Date of Birth: March 01, 1971  Subjective/Objective:                    Action/Plan:  Patient was on Xarelto prior to admission. Patient had Medicaid . At present patient's Medicaid is inactive , she is in the process of getting it active again.   MATCH letter given and explained. Entered 14 day exception for oxycodone 5 mg IR tab .  Gave Free 30 day supply Xarelto card.   Patient voiced understanding of all of the above. Expected Discharge Date:                  Expected Discharge Plan:  Home/Self Care  In-House Referral:  Financial Counselor  Discharge planning Services  MATCH Program, Medication Assistance  Post Acute Care Choice:    Choice offered to:  Patient  DME Arranged:    DME Agency:     HH Arranged:    HH Agency:     Status of Service:  Completed, signed off  If discussed at [employer], dates discussed:    Additional Comments:  Kingsley Plan, RN 08/15/2016, 11:30 AM

## 2016-08-15 NOTE — Progress Notes (Signed)
Progress Note  Patient Name: Lindsey [name] Date of Encounter: 08/15/2016  Primary Cardiologist: Jens Som  Subjective   46 yo with hx of CAD , apical mural thrombus, AT III deficiency.  Has been on xarelto Presented with abdominal pain - found to have cholecystitis  Is now s/p lap chole and is doing well.   Inpatient Medications    Scheduled Meds: . carvedilol  25 mg Oral BID WC  . chlorhexidine  15 mL Mouth Rinse BID  . docusate sodium  100 mg Oral BID  . enoxaparin (LOVENOX) injection  40 mg Subcutaneous Q24H  . mouth rinse  15 mL Mouth Rinse q12n4p  . pantoprazole  40 mg Oral Daily   Continuous Infusions: . piperacillin-tazobactam    . piperacillin-tazobactam (ZOSYN)  IV     PRN Meds: acetaminophen, diphenhydrAMINE **OR** diphenhydrAMINE, hydrALAZINE, ketorolac, morphine injection, nitroGLYCERIN, ondansetron **OR** ondansetron (ZOFRAN) IV, oxyCODONE   Vital Signs    Vitals:   08/14/16 2201 08/14/16 2306 08/15/16 0231 08/15/16 0549  BP: (!) 171/106 (!) 162/96 (!) 148/89 (!) 148/94  Pulse: 89  94 90  Resp: 16  16 19   Temp: 98.7 F (37.1 C)  98.9 F (37.2 C) 99.3 F (37.4 C)  TempSrc: Oral   Oral  SpO2: 100%  98% 99%  Weight:      Height:        Intake/Output Summary (Last 24 hours) at 08/15/16 0857 Last data filed at 08/15/16 0500  Gross per 24 hour  Intake          2831.75 ml  Output             1030 ml  Net          1801.75 ml   Filed Weights   08/14/16 1122  Weight: 184 lb 15.5 oz (83.9 kg)    Telemetry    nsr - Personally Reviewed  ECG    nsr  - Personally Reviewed  Physical Exam   GEN: No acute distress.   Awake, alers Neck: No JVD Cardiac: RRR, no murmurs, rubs, or gallops.  Respiratory: Clear to auscultation bilaterally. GI:  mild tenderness,  Some BP  MS: No edema; No deformity. Neuro:  Nonfocal  Psych: Normal affect   Labs    Chemistry Recent Labs Lab 08/13/16 1439 08/14/16 0428 08/15/16 0619  NA 139 140 138  K  3.9 4.0 3.7  CL 105 109 104  CO2 25 23 24   GLUCOSE 126* 131* 130*  BUN 7 7 <5*  CREATININE 0.76 0.64 0.64  CALCIUM 9.6 8.9 9.6  PROT 6.7 5.6* 6.6  ALBUMIN 3.5 3.0* 3.3*  AST 20 22 27   ALT 11* 14 20  ALKPHOS 77 71 77  BILITOT 0.5 0.5 0.6  GFRNONAA >60 >60 >60  GFRAA >60 >60 >60  ANIONGAP 9 8 10      Hematology Recent Labs Lab 08/13/16 1439 08/14/16 0428 08/15/16 0619  WBC 8.1 6.2 14.1*  RBC 4.31 3.81* 4.11  HGB 12.2 10.8* 11.7*  HCT 38.5 34.3* 36.0  MCV 89.3 90.0 87.6  MCH 28.3 28.3 28.5  MCHC 31.7 31.5 32.5  RDW 14.2 14.3 13.9  PLT 243 230 248    Cardiac EnzymesNo results for input(s): TROPONINI in the last 168 hours. No results for input(s): TROPIPOC in the last 168 hours.   BNPNo results for input(s): BNP, PROBNP in the last 168 hours.   DDimer No results for input(s): DDIMER in the last 168 hours.   Radiology  US Abdomen Complete  Result Date: 08/13/2016 CLINICAL DATA:  Initial evaluation for acute abdominal pain for 5 days. EXAM: ABDOMEN ULTRASOUND COMPLETE COMPARISON:  Prior CT from 04/08/2015. FINDINGS: Gallbladder: No echogenic stones seen within the gallbladder lumen. Gallbladder wall minimally thickened to 3.7 mm, most likely related to partial contraction. No free pericholecystic fluid. A positive sonographic Murphy sign was elicited on exam. Common bile duct: Diameter: 4.5 mm Liver: Mildly heterogeneous without focal lesion. Within normal limits for echogenicity. IVC: No abnormality visualized. Pancreas: Visualized portion unremarkable. Spleen: Size and appearance within normal limits. Right Kidney: Length: 11.5 cm. Echogenicity within normal limits. No mass or hydronephrosis visualized. Left Kidney: Length: 10.7 cm. Echogenicity within normal limits. No mass or hydronephrosis visualized. Abdominal aorta: No aneurysm visualized. Other findings: None. IMPRESSION: 1. Positive sonographic Murphy sign without cholelithiasis or additional sonographic features to  suggest acute cholecystitis. 2. No other acute abnormality within the abdomen. Electronically Signed   By: Rise Mu M.D.   On: 08/13/2016 22:39   Ct Abdomen Pelvis W Contrast  Result Date: 08/14/2016 CLINICAL DATA:  Acute onset of right upper quadrant abdominal pain and flank pain. Nausea and vomiting. Initial encounter. EXAM: CT ABDOMEN AND PELVIS WITH CONTRAST TECHNIQUE: Multidetector CT imaging of the abdomen and pelvis was performed using the standard protocol following bolus administration of intravenous contrast. CONTRAST:  ISOVUE-300 IOPAMIDOL (ISOVUE-300) INJECTION 61% COMPARISON:  CT of the abdomen and pelvis from 03/23/2015, and abdominal ultrasound performed earlier today at 10:03 p.m. FINDINGS: Lower chest: The visualized lung bases are grossly clear. The visualized portions of the mediastinum are unremarkable. Hepatobiliary: The liver is unremarkable in appearance. There is diffuse gallbladder wall thickening, with mild surrounding soft tissue inflammation, concerning for acute cholecystitis. The common bile duct remains normal in caliber. Pancreas: The pancreas is within normal limits. Spleen: The spleen is unremarkable in appearance. Adrenals/Urinary Tract: The adrenal glands are unremarkable in appearance. The kidneys are within normal limits. There is no evidence of hydronephrosis. No renal or ureteral stones are identified. No perinephric stranding is seen. Stomach/Bowel: The stomach is unremarkable in appearance. The small bowel is within normal limits. The appendix is normal in caliber, without evidence of appendicitis. A diverticulum is noted at the ascending colon, and mild diverticulosis is noted along the distal descending colon, without evidence of diverticulitis. Vascular/Lymphatic: The abdominal aorta is unremarkable in appearance. The inferior vena cava is grossly unremarkable. No retroperitoneal lymphadenopathy is seen. No pelvic sidewall lymphadenopathy is  identified. Reproductive: The bladder is mildly distended and within normal limits. The uterus is grossly unremarkable in appearance. The ovaries are relatively symmetric. No suspicious adnexal masses are seen. The patient is status post bilateral tubal ligation. Other: No additional soft tissue abnormalities are seen. Musculoskeletal: No acute osseous abnormalities are identified. The visualized musculature is unremarkable in appearance. IMPRESSION: 1. Diffuse gallbladder wall thickening, with mild surrounding soft inflammation, concerning for acute cholecystitis. 2. Mild diverticulosis at the distal descending colon and at the ascending colon, without evidence of diverticulitis. Electronically Signed   By: Roanna Raider M.D.   On: 08/14/2016 00:31    Cardiac Studies     Patient Profile     46 y.o. female with AT III deficiency and CAD   Assessment & Plan    1. CAD :   Stable No angina  2. ATIII deficiency:   Getting Lovenox currently Transition to Xarelto when general surgery gives the OK Follow up with Dr.  Jens Som  Will sign off. Call for  questions   Signed, Kristeen Miss, MD  08/15/2016, 8:57 AM

## 2016-08-15 NOTE — Addendum Note (Signed)
Addendum  created 08/15/16 1504 by Lowella Curb, MD   Anesthesia Attestations filed

## 2016-08-15 NOTE — Progress Notes (Signed)
Patient ID: Lindsey [name], female   DOB: 1970/04/02, 45 y.o.   MRN: [id number]  [facility] Progress Note  1 Day Post-Op  Subjective: CC- abdominal pain Patient states that she did not sleep well due to abdominal pain. States that the pain is similar as prior to surgery. Feels like muscle spasms. States that she vomited last night. Tolerating clear liquids this morning. No flatus or BM. Patient has ambulated to rest room.  Objective: Vital signs in last 24 hours: Temp:  [97.2 F (36.2 C)-99.3 F (37.4 C)] 99.3 F (37.4 C) (06/01 0549) Pulse Rate:  [61-96] 90 (06/01 0549) Resp:  [10-21] 19 (06/01 0549) BP: (114-180)/(87-106) 148/94 (06/01 0549) SpO2:  [96 %-100 %] 99 % (06/01 0549) Weight:  [184 lb 15.5 oz (83.9 kg)] 184 lb 15.5 oz (83.9 kg) (05/31 1122) Last BM Date: 08/13/16  Intake/Output from previous day: 05/31 0701 - 06/01 0700 In: 2831.8 [P.O.:802; I.V.:1977.8; IV Piggyback:52] Out: 1030 [Urine:1000; Blood:20] Intake/Output this shift: No intake/output data recorded.  PE: Gen:  Alert, NAD, pleasant Card:  RRR, no M/G/R heard Pulm:  CTAB, no W/R/R, effort normal Abd: Soft, ND, mild global and moderate RUQ tenderness, few BS heard, incisions C/D/I Ext:  No erythema, edema, or tenderness BUE/BLE   Lab Results:   Recent Labs  08/14/16 0428 08/15/16 0619  WBC 6.2 14.1*  HGB 10.8* 11.7*  HCT 34.3* 36.0  PLT 230 248   BMET  Recent Labs  08/14/16 0428 08/15/16 0619  NA 140 138  K 4.0 3.7  CL 109 104  CO2 23 24  GLUCOSE 131* 130*  BUN 7 <5*  CREATININE 0.64 0.64  CALCIUM 8.9 9.6   PT/INR No results for input(s): LABPROT, INR in the last 72 hours. CMP     Component Value Date/Time   NA 138 08/15/2016 0619   K 3.7 08/15/2016 0619   CL 104 08/15/2016 0619   CO2 24 08/15/2016 0619   GLUCOSE 130 (H) 08/15/2016 0619   BUN <5 (L) 08/15/2016 0619   CREATININE 0.64 08/15/2016 0619   CREATININE 0.52 09/26/2014 1607   CALCIUM 9.6 08/15/2016  0619   PROT 6.6 08/15/2016 0619   ALBUMIN 3.3 (L) 08/15/2016 0619   AST 27 08/15/2016 0619   ALT 20 08/15/2016 0619   ALKPHOS 77 08/15/2016 0619   BILITOT 0.6 08/15/2016 0619   GFRNONAA >60 08/15/2016 0619   GFRAA >60 08/15/2016 0619   Lipase     Component Value Date/Time   LIPASE 24 08/13/2016 1439       Studies/Results: US Abdomen Complete  Result Date: 08/13/2016 CLINICAL DATA:  Initial evaluation for acute abdominal pain for 5 days. EXAM: ABDOMEN ULTRASOUND COMPLETE COMPARISON:  Prior CT from 04/08/2015. FINDINGS: Gallbladder: No echogenic stones seen within the gallbladder lumen. Gallbladder wall minimally thickened to 3.7 mm, most likely related to partial contraction. No free pericholecystic fluid. A positive sonographic Murphy sign was elicited on exam. Common bile duct: Diameter: 4.5 mm Liver: Mildly heterogeneous without focal lesion. Within normal limits for echogenicity. IVC: No abnormality visualized. Pancreas: Visualized portion unremarkable. Spleen: Size and appearance within normal limits. Right Kidney: Length: 11.5 cm. Echogenicity within normal limits. No mass or hydronephrosis visualized. Left Kidney: Length: 10.7 cm. Echogenicity within normal limits. No mass or hydronephrosis visualized. Abdominal aorta: No aneurysm visualized. Other findings: None. IMPRESSION: 1. Positive sonographic Murphy sign without cholelithiasis or additional sonographic features to suggest acute cholecystitis. 2. No other acute abnormality within the abdomen. Electronically Signed  By: Rise Mu M.D.   On: 08/13/2016 22:39   Ct Abdomen Pelvis W Contrast  Result Date: 08/14/2016 CLINICAL DATA:  Acute onset of right upper quadrant abdominal pain and flank pain. Nausea and vomiting. Initial encounter. EXAM: CT ABDOMEN AND PELVIS WITH CONTRAST TECHNIQUE: Multidetector CT imaging of the abdomen and pelvis was performed using the standard protocol following bolus administration of  intravenous contrast. CONTRAST:  ISOVUE-300 IOPAMIDOL (ISOVUE-300) INJECTION 61% COMPARISON:  CT of the abdomen and pelvis from 03/23/2015, and abdominal ultrasound performed earlier today at 10:03 p.m. FINDINGS: Lower chest: The visualized lung bases are grossly clear. The visualized portions of the mediastinum are unremarkable. Hepatobiliary: The liver is unremarkable in appearance. There is diffuse gallbladder wall thickening, with mild surrounding soft tissue inflammation, concerning for acute cholecystitis. The common bile duct remains normal in caliber. Pancreas: The pancreas is within normal limits. Spleen: The spleen is unremarkable in appearance. Adrenals/Urinary Tract: The adrenal glands are unremarkable in appearance. The kidneys are within normal limits. There is no evidence of hydronephrosis. No renal or ureteral stones are identified. No perinephric stranding is seen. Stomach/Bowel: The stomach is unremarkable in appearance. The small bowel is within normal limits. The appendix is normal in caliber, without evidence of appendicitis. A diverticulum is noted at the ascending colon, and mild diverticulosis is noted along the distal descending colon, without evidence of diverticulitis. Vascular/Lymphatic: The abdominal aorta is unremarkable in appearance. The inferior vena cava is grossly unremarkable. No retroperitoneal lymphadenopathy is seen. No pelvic sidewall lymphadenopathy is identified. Reproductive: The bladder is mildly distended and within normal limits. The uterus is grossly unremarkable in appearance. The ovaries are relatively symmetric. No suspicious adnexal masses are seen. The patient is status post bilateral tubal ligation. Other: No additional soft tissue abnormalities are seen. Musculoskeletal: No acute osseous abnormalities are identified. The visualized musculature is unremarkable in appearance. IMPRESSION: 1. Diffuse gallbladder wall thickening, with mild surrounding soft  inflammation, concerning for acute cholecystitis. 2. Mild diverticulosis at the distal descending colon and at the ascending colon, without evidence of diverticulitis. Electronically Signed   By: Roanna Raider M.D.   On: 08/14/2016 00:31    Anti-infectives: Anti-infectives    Start     Dose/Rate Route Frequency Ordered Stop   08/15/16 1600  piperacillin-tazobactam (ZOSYN) IVPB 3.375 g     3.375 g 12.5 mL/hr over 240 Minutes Intravenous Every 8 hours 08/15/16 0853     08/15/16 1000  piperacillin-tazobactam (ZOSYN) IVPB 3.375 g     3.375 g 100 mL/hr over 30 Minutes Intravenous  Once 08/15/16 0853     08/15/16 0900  piperacillin-tazobactam (ZOSYN) IVPB 3.375 g  Status:  Discontinued     3.375 g 100 mL/hr over 30 Minutes Intravenous Every 8 hours 08/15/16 0848 08/15/16 0851   08/14/16 1000  piperacillin-tazobactam (ZOSYN) IVPB 3.375 g  Status:  Discontinued     3.375 g 12.5 mL/hr over 240 Minutes Intravenous Every 8 hours 08/14/16 0145 08/14/16 1756   08/14/16 0200  piperacillin-tazobactam (ZOSYN) IVPB 3.375 g     3.375 g 100 mL/hr over 30 Minutes Intravenous STAT 08/14/16 0145 08/14/16 0530   08/14/16 0045  cefTRIAXone (ROCEPHIN) 2 g in dextrose 5 % 50 mL IVPB     2 g 100 mL/hr over 30 Minutes Intravenous  Once 08/14/16 0039 08/14/16 0231       Assessment/Plan Acute cholecystitis S/p lap chole 5/31 Dr. Andrey Campanile  - POD 1  - add robaxin and tylenol for better pain control.  Encourage ambulation. If pain improves and patient tolerates regular diet, she could be discharged this afternoon from surgical standpoint; she does not need antibiotics.  - will follow-up in [facility] clinic in about 3 weeks  H/o apical mural thrombus - ok to restart anticoagulation from surgical standpoint ATIII deficiency  H/o CAD Anemia H/o ischemia left PCA stroke Left leg amputation.  ID - zosyn 5/31>> FEN - regular diet VTE - SCDs, heparin   LOS: 1 day    Edson Snowball , [facility] 08/15/2016, 8:57 AM Pager: [phone] Consults: [phone] Mon-Fri 7:00 am-4:30 pm Sat-Sun 7:00 am-11:30 am  Agree with above. Her mother is in the room.  Ovidio Kin, MD, [facility] Pager: [phone] Office phone:  [phone]

## 2016-08-15 NOTE — Progress Notes (Signed)
ANTICOAGULATION CONSULT NOTE - Initial Consult  Pharmacy Consult for Xarelto Indication: antithrombin III deficiency and apical mural thrombus  No Known Allergies  Patient Measurements: Height: 5\' 6"  (167.6 cm) Weight: 184 lb 15.5 oz (83.9 kg) IBW/kg (Calculated) : 59.3  Vital Signs: Temp: 98.4 F (36.9 C) (06/01 1451) Temp Source: Oral (06/01 1451) BP: 111/73 (06/01 1451) Pulse Rate: 81 (06/01 1451)  Labs:  Recent Labs  08/13/16 1439 08/14/16 0428 08/15/16 0619  HGB 12.2 10.8* 11.7*  HCT 38.5 34.3* 36.0  PLT 243 230 248  CREATININE 0.76 0.64 0.64    Estimated Creatinine Clearance: 94.8 mL/min (by C-G formula based on SCr of 0.64 mg/dL).   Medical History: Past Medical History:  Diagnosis Date  . Anemia   . Antithrombin III deficiency (HCC)    ?pt not sure if true diagnosis  . Anxiety   . Apical mural thrombus   . Blood transfusion   . Cholecystitis 07/2016  . Coronary artery disease    a. apical LAD infarction '00. b. NSTEMI s/p BMS to prox LAD '09. c. Cath 01/2015: stable LAD stent, otherwise minimal nonobstructive CAD.  Marland Kitchen GERD (gastroesophageal reflux disease)   . Hyperlipidemia   . Hypertension   . Myocardial infarction (HCC)   . Noncompliance with medication regimen    a. h/o noncompliance with med regimen (previous running out of Coumadin).  . Stroke (HCC)    assoc with short term memory loss and right peripheral vision loss; age 46   . TIA (transient ischemic attack) 2010  . Tobacco abuse    Assessment: 46yof on xarelto pta for antithrombin III deficiency and apical mural thrombus, admited with acute cholecystitis. Xarelto held. She is now s/p lap chole yesterday and xarelto to resume. Renal function stable, CrCl 67ml/min. CBC stable. She was previously receiving sq lovenox 40mg  - last dose this morning at 0817.   Goal of Therapy:  Monitor platelets by anticoagulation protocol: Yes   Plan:  1) Resume xarelto 20mg  po daily tonight  Fredrik Rigger 08/15/2016,6:04 PM

## 2016-08-15 NOTE — Progress Notes (Signed)
PROGRESS NOTE    [name]  PZW:[id number] DOB: 1970-11-23 DOA: 08/13/2016 PCP: [facility]    Brief Narrative:  Patient is a 46 year old female history of coronary artery disease status post stenting, apical mural thrombus, history of TIA, hypertension, anemia presenting to the ED with abdominal pain which have been worsening over the past 3-4 days then in the right upper quadrant. CT abdomen and pelvis done was concerning for acute cholecystitis. General surgery was consulted. Patient subsequently underwent a laparoscopic cholecystectomy per Dr. Andrey Campanile.   Assessment & Plan:   Principal Problem:   Acute cholecystitis Active Problems:   Essential hypertension   Antithrombin III deficiency (HCC)   Apical mural thrombus   History of ischemic left PCA stroke   Vitamin B12 deficiency  #1 acute cholecystitis Status post laparoscopic cholecystectomy 08/14/2016 per Dr. Andrey Campanile. Patient still with abdominal pain. Patient tolerating clears. Patient hasn't passed flatus, no bowel movement. Robaxin and Tylenol have been added for better pain control per general surgery. Patient currently on IV Zosyn. We'll not need any antibiotics on discharge. General surgery following and appreciate input and recommendations.  #2 history of apical mural thrombus Per general surgery ok to resume anticoagulation. Will resume patient's Xarelto per pharmacy.  #3 antithrombin III deficiency Resume patient's xarelto.  #4 vitamin B 12 deficiency Vitamin B 12 levels of 106. Place on vitamin B-12 IM injections daily 1 week and then weekly times one month and then monthly. Outpatient follow-up.  #5 history of ischemic left PCA stroke/TIA Currently stable. Asymptomatic. Resume xarelto for secondary stroke prevention.   #6 hypertension Continue home regimen Coreg.  #7 leukocytosis Likely stress-induced and secondary to problem #1. Patient afebrile. Patient currently on IV Zosyn.  Follow for now.   DVT prophylaxis: SCD/resume xarelto today Code Status: Full Family Communication: Updated patient. No family at bedside. Disposition Plan: Likely home tomorrow if medically stable.   Consultants:   Cardiology Dr. Elease Hashimoto 08/14/2016  Gen. surgery: Dr.Tsuei 08/14/2016  Procedures:   Laparoscopic cholecystectomy per Dr. Andrey Campanile 08/14/2016  CT abdomen and pelvis 08/13/2016  Abdominal ultrasound 08/13/2016  Antimicrobials:   IV Zosyn 08/14/2016   Subjective: Patient with some complaints of abdominal pain. Patient with no nausea or emesis. Patient tolerating current diet of clear liquids. No flatus. No bowel movement.  Objective: Vitals:   08/14/16 2306 08/15/16 0231 08/15/16 0549 08/15/16 1020  BP: (!) 162/96 (!) 148/89 (!) 148/94 (!) 168/110  Pulse:  94 90   Resp:  16 19   Temp:  98.9 F (37.2 C) 99.3 F (37.4 C)   TempSrc:   Oral   SpO2:  98% 99%   Weight:      Height:        Intake/Output Summary (Last 24 hours) at 08/15/16 1321 Last data filed at 08/15/16 1100  Gross per 24 hour  Intake          2831.75 ml  Output             1930 ml  Net           901.75 ml   Filed Weights   08/14/16 1122  Weight: 83.9 kg (184 lb 15.5 oz)    Examination:  General exam: Appears calm and comfortable  Respiratory system: Clear to auscultation. Respiratory effort normal. Cardiovascular system: S1 & S2 heard, RRR. No JVD, murmurs, rubs, gallops or clicks. No pedal edema. Gastrointestinal system: Abdomen is nondistended, soft and some tenderness to palpation. No organomegaly or  masses felt. Normal bowel sounds heard. Central nervous system: Alert and oriented. No focal neurological deficits. Extremities: Symmetric 5 x 5 power. Skin: No rashes, lesions or ulcers Psychiatry: Judgement and insight appear normal. Mood & affect appropriate.     Data Reviewed: I have personally reviewed following labs and imaging studies  CBC:  Recent Labs Lab  08/13/16 1439 08/14/16 0428 08/15/16 0619  WBC 8.1 6.2 14.1*  NEUTROABS  --   --  12.1*  HGB 12.2 10.8* 11.7*  HCT 38.5 34.3* 36.0  MCV 89.3 90.0 87.6  PLT 243 230 248   Basic Metabolic Panel:  Recent Labs Lab 08/13/16 1439 08/14/16 0428 08/15/16 0619  NA 139 140 138  K 3.9 4.0 3.7  CL 105 109 104  CO2 25 23 24   GLUCOSE 126* 131* 130*  BUN 7 7 <5*  CREATININE 0.76 0.64 0.64  CALCIUM 9.6 8.9 9.6   GFR: Estimated Creatinine Clearance: 94.8 mL/min (by C-G formula based on SCr of 0.64 mg/dL). Liver Function Tests:  Recent Labs Lab 08/13/16 1439 08/14/16 0428 08/15/16 0619  AST 20 22 27   ALT 11* 14 20  ALKPHOS 77 71 77  BILITOT 0.5 0.5 0.6  PROT 6.7 5.6* 6.6  ALBUMIN 3.5 3.0* 3.3*    Recent Labs Lab 08/13/16 1439  LIPASE 24   No results for input(s): AMMONIA in the last 168 hours. Coagulation Profile: No results for input(s): INR, PROTIME in the last 168 hours. Cardiac Enzymes: No results for input(s): CKTOTAL, CKMB, CKMBINDEX, TROPONINI in the last 168 hours. BNP (last 3 results) No results for input(s): PROBNP in the last 8760 hours. HbA1C: No results for input(s): HGBA1C in the last 72 hours. CBG:  Recent Labs Lab 08/14/16 0810 08/14/16 1832 08/15/16 0016 08/15/16 0727 08/15/16 1209  GLUCAP 102* 126* 161* 118* 107*   Lipid Profile: No results for input(s): CHOL, HDL, LDLCALC, TRIG, CHOLHDL, LDLDIRECT in the last 72 hours. Thyroid Function Tests: No results for input(s): TSH, T4TOTAL, FREET4, T3FREE, THYROIDAB in the last 72 hours. Anemia Panel:  Recent Labs  08/14/16 0836  VITAMINB12 106*  FOLATE 10.0  FERRITIN 14  TIBC 332  IRON 53   Sepsis Labs: No results for input(s): PROCALCITON, LATICACIDVEN in the last 168 hours.  Recent Results (from the past 240 hour(s))  Surgical pcr screen     Status: None   Collection Time: 08/14/16  2:31 AM  Result Value Ref Range Status   MRSA, PCR NEGATIVE NEGATIVE Final   Staphylococcus aureus  NEGATIVE NEGATIVE Final    Comment:        The Xpert SA Assay (FDA approved for NASAL specimens in patients over 40 years of age), is one component of a comprehensive surveillance program.  Test performance has been validated by [facility] for patients greater than or equal to 44 year old. It is not intended to diagnose infection nor to guide or monitor treatment.          Radiology Studies: US Abdomen Complete  Result Date: 08/13/2016 CLINICAL DATA:  Initial evaluation for acute abdominal pain for 5 days. EXAM: ABDOMEN ULTRASOUND COMPLETE COMPARISON:  Prior CT from 04/08/2015. FINDINGS: Gallbladder: No echogenic stones seen within the gallbladder lumen. Gallbladder wall minimally thickened to 3.7 mm, most likely related to partial contraction. No free pericholecystic fluid. A positive sonographic Murphy sign was elicited on exam. Common bile duct: Diameter: 4.5 mm Liver: Mildly heterogeneous without focal lesion. Within normal limits for echogenicity. IVC: No abnormality visualized. Pancreas: Visualized  portion unremarkable. Spleen: Size and appearance within normal limits. Right Kidney: Length: 11.5 cm. Echogenicity within normal limits. No mass or hydronephrosis visualized. Left Kidney: Length: 10.7 cm. Echogenicity within normal limits. No mass or hydronephrosis visualized. Abdominal aorta: No aneurysm visualized. Other findings: None. IMPRESSION: 1. Positive sonographic Murphy sign without cholelithiasis or additional sonographic features to suggest acute cholecystitis. 2. No other acute abnormality within the abdomen. Electronically Signed   By: Rise Mu M.D.   On: 08/13/2016 22:39   Ct Abdomen Pelvis W Contrast  Result Date: 08/14/2016 CLINICAL DATA:  Acute onset of right upper quadrant abdominal pain and flank pain. Nausea and vomiting. Initial encounter. EXAM: CT ABDOMEN AND PELVIS WITH CONTRAST TECHNIQUE: Multidetector CT imaging of the abdomen and pelvis was  performed using the standard protocol following bolus administration of intravenous contrast. CONTRAST:  ISOVUE-300 IOPAMIDOL (ISOVUE-300) INJECTION 61% COMPARISON:  CT of the abdomen and pelvis from 03/23/2015, and abdominal ultrasound performed earlier today at 10:03 p.m. FINDINGS: Lower chest: The visualized lung bases are grossly clear. The visualized portions of the mediastinum are unremarkable. Hepatobiliary: The liver is unremarkable in appearance. There is diffuse gallbladder wall thickening, with mild surrounding soft tissue inflammation, concerning for acute cholecystitis. The common bile duct remains normal in caliber. Pancreas: The pancreas is within normal limits. Spleen: The spleen is unremarkable in appearance. Adrenals/Urinary Tract: The adrenal glands are unremarkable in appearance. The kidneys are within normal limits. There is no evidence of hydronephrosis. No renal or ureteral stones are identified. No perinephric stranding is seen. Stomach/Bowel: The stomach is unremarkable in appearance. The small bowel is within normal limits. The appendix is normal in caliber, without evidence of appendicitis. A diverticulum is noted at the ascending colon, and mild diverticulosis is noted along the distal descending colon, without evidence of diverticulitis. Vascular/Lymphatic: The abdominal aorta is unremarkable in appearance. The inferior vena cava is grossly unremarkable. No retroperitoneal lymphadenopathy is seen. No pelvic sidewall lymphadenopathy is identified. Reproductive: The bladder is mildly distended and within normal limits. The uterus is grossly unremarkable in appearance. The ovaries are relatively symmetric. No suspicious adnexal masses are seen. The patient is status post bilateral tubal ligation. Other: No additional soft tissue abnormalities are seen. Musculoskeletal: No acute osseous abnormalities are identified. The visualized musculature is unremarkable in appearance. IMPRESSION:  1. Diffuse gallbladder wall thickening, with mild surrounding soft inflammation, concerning for acute cholecystitis. 2. Mild diverticulosis at the distal descending colon and at the ascending colon, without evidence of diverticulitis. Electronically Signed   By: Roanna Raider M.D.   On: 08/14/2016 00:31        Scheduled Meds: . acetaminophen  650 mg Oral Q6H  . carvedilol  25 mg Oral BID WC  . chlorhexidine  15 mL Mouth Rinse BID  . cyanocobalamin  1,000 mcg Intramuscular Daily  . docusate sodium  100 mg Oral BID  . enoxaparin (LOVENOX) injection  40 mg Subcutaneous Q24H  . mouth rinse  15 mL Mouth Rinse q12n4p  . methocarbamol  500 mg Oral TID  . pantoprazole  40 mg Oral Daily   Continuous Infusions: . piperacillin-tazobactam (ZOSYN)  IV       LOS: 1 day    Time spent: 35 mins    THOMPSON,DANIEL, MD [facility] Pager [phone]  If 7PM-7AM, please contact night-coverage [email or web address] Password [email or web address] 08/15/2016, 1:21 PM

## 2016-08-15 NOTE — Discharge Instructions (Signed)

## 2016-08-16 LAB — BASIC METABOLIC PANEL
ANION GAP: 12 (ref 5–15)
BUN: 10 mg/dL (ref 6–20)
CHLORIDE: 98 mmol/L — AB (ref 101–111)
CO2: 23 mmol/L (ref 22–32)
Calcium: 9.3 mg/dL (ref 8.9–10.3)
Creatinine, Ser: 0.86 mg/dL (ref 0.44–1.00)
GFR calc non Af Amer: >60 mL/min (ref >60)
Glucose, Bld: 115 mg/dL — ABNORMAL HIGH (ref 65–99)
POTASSIUM: 3.4 mmol/L — AB (ref 3.5–5.1)
SODIUM: 133 mmol/L — AB (ref 135–145)

## 2016-08-16 LAB — CBC
HEMATOCRIT: 36.9 % (ref 36.0–46.0)
HEMOGLOBIN: 11.5 g/dL — AB (ref 12.0–15.0)
MCH: 27.8 pg (ref 26.0–34.0)
MCHC: 31.2 g/dL (ref 30.0–36.0)
MCV: 89.1 fL (ref 78.0–100.0)
Platelets: 233 10*3/uL (ref 150–400)
RBC: 4.14 MIL/uL (ref 3.87–5.11)
RDW: 13.9 % (ref 11.5–15.5)
WBC: 6.6 10*3/uL (ref 4.0–10.5)

## 2016-08-16 LAB — GLUCOSE, CAPILLARY: GLUCOSE-CAPILLARY: 109 mg/dL — AB (ref 65–99)

## 2016-08-16 MED ORDER — ONDANSETRON 8 MG PO TBDP: 8.0000 mg | ORAL_TABLET | Freq: Three times a day (TID) | ORAL | 0 refills | Status: DC | PRN

## 2016-08-16 MED ORDER — CARVEDILOL 25 MG PO TABS: 25.0000 mg | ORAL_TABLET | Freq: Two times a day (BID) | ORAL | 1 refills | Status: DC

## 2016-08-16 MED ORDER — ATORVASTATIN CALCIUM 40 MG PO TABS: 40.0000 mg | ORAL_TABLET | Freq: Every day | ORAL | 2 refills | Status: DC

## 2016-08-16 MED ORDER — VITAMIN B-12 1000 MCG PO TABS: 1000.0000 ug | ORAL_TABLET | Freq: Every day | ORAL | 2 refills | Status: DC

## 2016-08-16 MED ORDER — NITROGLYCERIN 0.4 MG SL SUBL: 0.4000 mg | SUBLINGUAL_TABLET | SUBLINGUAL | 0 refills | Status: DC | PRN

## 2016-08-16 MED ORDER — OXYCODONE HCL 5 MG PO TABS: 5.0000 mg | ORAL_TABLET | ORAL | 0 refills | Status: DC | PRN

## 2016-08-16 MED ORDER — CYANOCOBALAMIN 1000 MCG/ML IJ SOLN: 1000.0000 ug | Freq: Every day | INTRAMUSCULAR | 0 refills | Status: DC

## 2016-08-16 MED ORDER — CLOPIDOGREL BISULFATE 75 MG PO TABS: 75.0000 mg | ORAL_TABLET | Freq: Every day | ORAL | 1 refills | Status: DC

## 2016-08-16 MED ORDER — DOCUSATE SODIUM 100 MG PO CAPS: 100.0000 mg | ORAL_CAPSULE | Freq: Two times a day (BID) | ORAL | 0 refills | Status: DC

## 2016-08-16 MED ORDER — RIVAROXABAN 20 MG PO TABS: 20.0000 mg | ORAL_TABLET | Freq: Every day | ORAL | 1 refills | Status: DC

## 2016-08-16 MED ORDER — PANTOPRAZOLE SODIUM 40 MG PO TBEC: 40.0000 mg | DELAYED_RELEASE_TABLET | Freq: Every day | ORAL | 1 refills | Status: DC

## 2016-08-16 MED ORDER — POTASSIUM CHLORIDE CRYS ER 20 MEQ PO TBCR: 40.0000 meq | EXTENDED_RELEASE_TABLET | Freq: Once | ORAL | Status: DC

## 2016-08-16 MED ORDER — METHOCARBAMOL 500 MG PO TABS: 500.0000 mg | ORAL_TABLET | Freq: Three times a day (TID) | ORAL | 0 refills | Status: DC

## 2016-08-16 NOTE — Discharge Summary (Signed)
Physician Discharge Summary  Lindsey [name] KGS:[id number] DOB: 07/16/1970 DOA: 08/13/2016  PCP: [facility]  Admit date: 08/13/2016 Discharge date: 08/16/2016  Time spent: 65 minutes  Recommendations for Outpatient Follow-up:  1. Patient will be discharged home. Follow-up with [facility] in 2 weeks. On follow-up patient's vitamin B 12 levels to be reassessed. Patient will need a basic metabolic profile done to follow-up on electrolytes renal function. Patient's blood pressure needs to be reassessed. Patient's medications will need to be reassessed as patient stopped taking her medications a few months ago. 2. Follow-up with [facility] and 3 weeks.   Discharge Diagnoses:  Principal Problem:   Acute cholecystitis Active Problems:   Essential hypertension   Antithrombin III deficiency (HCC)   Apical mural thrombus   History of ischemic left PCA stroke   Vitamin B12 deficiency   Discharge Condition: Stable and improved  Diet recommendation: Soft diet/regular  Filed Weights   08/14/16 1122  Weight: 83.9 kg (184 lb 15.5 oz)    History of present illness:  Per Dr.Kakrakandy  Lindsey [name] is a 46 y.o. female with history of CAD status post stenting, apical mural thrombus, history of TIA, hypertension and anemia presented to the ER with complaints of abdominal pain. Patient has been having gradually worsening right upper quadrant pain over the last 3-4 days. Abdominal pain was constant and not related to food and had been having associated nausea. Denied any fever chills chest pain or shortness of breath.   ED Course: CT of the abdomen and pelvis done in the ER is concerning for acute cholecystitis. On-call general surgeon Dr. Gwinda Passe has been consulted. Patient was being admitted for further management. Patient at this time only takes xarelto and no other medications. Patient has not taken her xarelto last 3 weeks  since patient was having dental procedure done. Until a week ago patient used to be on penicillin for tooth infection.   Hospital Course:  #1 acute cholecystitis Patient had presented with constant abdominal pain. CT abdomen and pelvis done in the ED was consistent with acute cholecystitis. Patient was seen in consultation by general surgery. Patient subsequently underwent laparoscopic cholecystectomy 08/14/2016 per Dr. Andrey Campanile. Patient abdominal pain improved and was no longer constant in nature. Patient was started on clears which she tolerated and diet advanced to a soft diet. Postoperatively patient started to have some flatus.  Robaxin and Tylenol added for better pain control per general surgery. Patient was continued on IV Zosyn during the hospitalization and will not need any further antibiotics on discharge. Patient will follow-up with general surgery outpatient setting.   #2 history of apical mural thrombus On admission patient had taken herself off her Xarelto for approximately 3 weeks in anticipation of oral surgery. Patient had presented with acute cholecystitis and postoperatively after laparoscopic cholecystectomy patient was resumed back on Xarelto as recommended per general surgery.  #3 antithrombin III deficiency Resumed patient's home regimen of xarelto.  #4 vitamin B 12 deficiency Vitamin B 12 levels of 106. Placed on vitamin B-12 IM injections daily daily. Patient will be discharged on oral vitamin B 12 1000 MCG's daily. Outpatient follow-up.   #5 history of ischemic left PCA stroke/TIA Patient remained asymptomatic and stable during the hospitalization. Postoperatively when okay with general surgery patient was resumed back on home regimen of Xarelto.   #6 hypertension Patient noted to be hypertensive during the hospitalization. Patient did state that she stopped taking all  her medications except her Xarelto a few months ago. Patient was placed back on home regimen of  Coreg which she tolerated. Outpatient follow-up.   #7 leukocytosis Likely stress-induced and secondary to problem #1. Patient remained afebrile. Patient was on IV Zosyn during the hospitalization. Was operatively per general surgery no need for any further antibiotics on discharge.    Procedures:  Laparoscopic cholecystectomy per Dr. Andrey Campanile 08/14/2016  CT abdomen and pelvis 08/13/2016  Abdominal ultrasound 08/13/2016   Consultations:  Cardiology Dr. Elease Hashimoto 08/14/2016  Gen. surgery: Dr.Tsuei 08/14/2016  Discharge Exam: Vitals:   08/16/16 0544 08/16/16 1313  BP: 100/66 115/77  Pulse: 78 (!) 58  Resp: 19 19  Temp: 98.5 F (36.9 C) 98.6 F (37 C)    General: NAD Cardiovascular: RRR Respiratory: CTAB  Discharge Instructions   Discharge Instructions    Diet - low sodium heart healthy    Complete by:  As directed    Increase activity slowly    Complete by:  As directed      Current Discharge Medication List    START taking these medications   Details  docusate sodium (COLACE) 100 MG capsule Take 1 capsule (100 mg total) by mouth 2 (two) times daily. Qty: 10 capsule, Refills: 0    methocarbamol (ROBAXIN) 500 MG tablet Take 1 tablet (500 mg total) by mouth 3 (three) times daily. Qty: 15 tablet, Refills: 0    oxyCODONE (OXY IR/ROXICODONE) 5 MG immediate release tablet Take 1-2 tablets (5-10 mg total) by mouth every 4 (four) hours as needed for moderate pain. Qty: 20 tablet, Refills: 0    vitamin B-12 (CYANOCOBALAMIN) 1000 MCG tablet Take 1 tablet (1,000 mcg total) by mouth daily. Qty: 30 tablet, Refills: 2      CONTINUE these medications which have CHANGED   Details  atorvastatin (LIPITOR) 40 MG tablet Take 1 tablet (40 mg total) by mouth daily. Qty: 30 tablet, Refills: 2    carvedilol (COREG) 25 MG tablet Take 1 tablet (25 mg total) by mouth 2 (two) times daily with a meal. Qty: 60 tablet, Refills: 1    clopidogrel (PLAVIX) 75 MG tablet Take 1 tablet  (75 mg total) by mouth daily. Qty: 30 tablet, Refills: 1    nitroGLYCERIN (NITROSTAT) 0.4 MG SL tablet Place 1 tablet (0.4 mg total) under the tongue every 5 (five) minutes as needed for chest pain (up to 3 doses). Qty: 15 tablet, Refills: 0   Associated Diagnoses: Atherosclerosis of native coronary artery of native heart without angina pectoris    ondansetron (ZOFRAN ODT) 8 MG disintegrating tablet Take 1 tablet (8 mg total) by mouth every 8 (eight) hours as needed for nausea or vomiting. Qty: 12 tablet, Refills: 0    pantoprazole (PROTONIX) 40 MG tablet Take 1 tablet (40 mg total) by mouth daily. Qty: 30 tablet, Refills: 1    rivaroxaban (XARELTO) 20 MG TABS tablet Take 1 tablet (20 mg total) by mouth daily with supper. Qty: 30 tablet, Refills: 1   Associated Diagnoses: Apical mural thrombus; Essential hypertension; NSTEMI (non-ST elevated myocardial infarction) (HCC)      CONTINUE these medications which have NOT CHANGED   Details  acetaminophen (TYLENOL) 500 MG tablet Take 1,000 mg by mouth every 6 (six) hours as needed for headache (pain).    hydrocortisone (ANUSOL-HC) 2.5 % rectal cream Apply rectally 2 times daily prn Qty: 28 g, Refills: 0      STOP taking these medications     ALPRAZolam (XANAX) 0.25 MG  tablet      HYDROcodone-acetaminophen (NORCO/VICODIN) 5-325 MG tablet      hydrocortisone-pramoxine (PROCTOFOAM-HC) rectal foam      ondansetron (ZOFRAN) 4 MG tablet      promethazine (PHENERGAN) 25 MG tablet        No Known Allergies Follow-up Information    [facility] Call in 3 week(s).   Specialty:  General Surgery Why:  We are working on your appointment, please call to confirm. Contact information: [address] [place] [address] [phone]       [facility]. Schedule an appointment as soon as possible for a visit in 2 week(s).   Why:  f/u in 2 weeks. Contact  information: [address] [place] [address] [phone]            The results of significant diagnostics from this hospitalization (including imaging, microbiology, ancillary and laboratory) are listed below for reference.    Significant Diagnostic Studies: Dg Chest 2 View  Result Date: 08/07/2016 CLINICAL DATA:  Bilateral lower extremity swelling and pain. EXAM: CHEST  2 VIEW COMPARISON:  11/20/2015 FINDINGS: Normal heart size and mediastinal contours. No acute infiltrate or edema. No effusion or pneumothorax. No acute osseous findings. IMPRESSION: Negative chest. Electronically Signed   By: Marnee Spring M.D.   On: 08/07/2016 12:49   US Abdomen Complete  Result Date: 08/13/2016 CLINICAL DATA:  Initial evaluation for acute abdominal pain for 5 days. EXAM: ABDOMEN ULTRASOUND COMPLETE COMPARISON:  Prior CT from 04/08/2015. FINDINGS: Gallbladder: No echogenic stones seen within the gallbladder lumen. Gallbladder wall minimally thickened to 3.7 mm, most likely related to partial contraction. No free pericholecystic fluid. A positive sonographic Murphy sign was elicited on exam. Common bile duct: Diameter: 4.5 mm Liver: Mildly heterogeneous without focal lesion. Within normal limits for echogenicity. IVC: No abnormality visualized. Pancreas: Visualized portion unremarkable. Spleen: Size and appearance within normal limits. Right Kidney: Length: 11.5 cm. Echogenicity within normal limits. No mass or hydronephrosis visualized. Left Kidney: Length: 10.7 cm. Echogenicity within normal limits. No mass or hydronephrosis visualized. Abdominal aorta: No aneurysm visualized. Other findings: None. IMPRESSION: 1. Positive sonographic Murphy sign without cholelithiasis or additional sonographic features to suggest acute cholecystitis. 2. No other acute abnormality within the abdomen. Electronically Signed   By: Rise Mu M.D.   On: 08/13/2016 22:39   Ct Abdomen Pelvis W  Contrast  Result Date: 08/14/2016 CLINICAL DATA:  Acute onset of right upper quadrant abdominal pain and flank pain. Nausea and vomiting. Initial encounter. EXAM: CT ABDOMEN AND PELVIS WITH CONTRAST TECHNIQUE: Multidetector CT imaging of the abdomen and pelvis was performed using the standard protocol following bolus administration of intravenous contrast. CONTRAST:  ISOVUE-300 IOPAMIDOL (ISOVUE-300) INJECTION 61% COMPARISON:  CT of the abdomen and pelvis from 03/23/2015, and abdominal ultrasound performed earlier today at 10:03 p.m. FINDINGS: Lower chest: The visualized lung bases are grossly clear. The visualized portions of the mediastinum are unremarkable. Hepatobiliary: The liver is unremarkable in appearance. There is diffuse gallbladder wall thickening, with mild surrounding soft tissue inflammation, concerning for acute cholecystitis. The common bile duct remains normal in caliber. Pancreas: The pancreas is within normal limits. Spleen: The spleen is unremarkable in appearance. Adrenals/Urinary Tract: The adrenal glands are unremarkable in appearance. The kidneys are within normal limits. There is no evidence of hydronephrosis. No renal or ureteral stones are identified. No perinephric stranding is seen. Stomach/Bowel: The stomach is unremarkable in appearance. The small  bowel is within normal limits. The appendix is normal in caliber, without evidence of appendicitis. A diverticulum is noted at the ascending colon, and mild diverticulosis is noted along the distal descending colon, without evidence of diverticulitis. Vascular/Lymphatic: The abdominal aorta is unremarkable in appearance. The inferior vena cava is grossly unremarkable. No retroperitoneal lymphadenopathy is seen. No pelvic sidewall lymphadenopathy is identified. Reproductive: The bladder is mildly distended and within normal limits. The uterus is grossly unremarkable in appearance. The ovaries are relatively symmetric. No suspicious  adnexal masses are seen. The patient is status post bilateral tubal ligation. Other: No additional soft tissue abnormalities are seen. Musculoskeletal: No acute osseous abnormalities are identified. The visualized musculature is unremarkable in appearance. IMPRESSION: 1. Diffuse gallbladder wall thickening, with mild surrounding soft inflammation, concerning for acute cholecystitis. 2. Mild diverticulosis at the distal descending colon and at the ascending colon, without evidence of diverticulitis. Electronically Signed   By: Roanna Raider M.D.   On: 08/14/2016 00:31    Microbiology: Recent Results (from the past 240 hour(s))  Surgical pcr screen     Status: None   Collection Time: 08/14/16  2:31 AM  Result Value Ref Range Status   MRSA, PCR NEGATIVE NEGATIVE Final   Staphylococcus aureus NEGATIVE NEGATIVE Final    Comment:        The Xpert SA Assay (FDA approved for NASAL specimens in patients over 73 years of age), is one component of a comprehensive surveillance program.  Test performance has been validated by [facility] for patients greater than or equal to 74 year old. It is not intended to diagnose infection nor to guide or monitor treatment.      Labs: Basic Metabolic Panel:  Recent Labs Lab 08/13/16 1439 08/14/16 0428 08/15/16 0619 08/16/16 0516  NA 139 140 138 133*  K 3.9 4.0 3.7 3.4*  CL 105 109 104 98*  CO2 25 23 24 23   GLUCOSE 126* 131* 130* 115*  BUN 7 7 <5* 10  CREATININE 0.76 0.64 0.64 0.86  CALCIUM 9.6 8.9 9.6 9.3   Liver Function Tests:  Recent Labs Lab 08/13/16 1439 08/14/16 0428 08/15/16 0619  AST 20 22 27   ALT 11* 14 20  ALKPHOS 77 71 77  BILITOT 0.5 0.5 0.6  PROT 6.7 5.6* 6.6  ALBUMIN 3.5 3.0* 3.3*    Recent Labs Lab 08/13/16 1439  LIPASE 24   No results for input(s): AMMONIA in the last 168 hours. CBC:  Recent Labs Lab 08/13/16 1439 08/14/16 0428 08/15/16 0619 08/16/16 0516  WBC 8.1 6.2 14.1* 6.6  NEUTROABS  --   --   12.1*  --   HGB 12.2 10.8* 11.7* 11.5*  HCT 38.5 34.3* 36.0 36.9  MCV 89.3 90.0 87.6 89.1  PLT 243 230 248 233   Cardiac Enzymes: No results for input(s): CKTOTAL, CKMB, CKMBINDEX, TROPONINI in the last 168 hours. BNP: BNP (last 3 results)  Recent Labs  08/07/16 1149  BNP 39.5    ProBNP (last 3 results) No results for input(s): PROBNP in the last 8760 hours.  CBG:  Recent Labs Lab 08/15/16 0727 08/15/16 1209 08/15/16 1733 08/15/16 2356 08/16/16 0736  GLUCAP 118* 107* 117* 124* 109*       Signed:  THOMPSON,DANIEL MD.  [facility] 08/16/2016, 3:42 PM

## 2016-08-16 NOTE — Progress Notes (Signed)
Patient ID: Lindsey [name], female   DOB: [birth year], 46 y.o.   MRN: [id number] [facility] Progress Note:   2 Days Post-Op  Subjective: Mental status is clear.  No complaints.  Waiting on anticoagulation Objective: Vital signs in last 24 hours: Temp:  [97.6 F (36.4 C)-98.5 F (36.9 C)] 98.5 F (36.9 C) (06/02 0544) Pulse Rate:  [78-81] 78 (06/02 0544) Resp:  [18-19] 19 (06/02 0544) BP: (100-168)/(66-110) 100/66 (06/02 0544) SpO2:  [99 %-100 %] 99 % (06/02 0544)  Intake/Output from previous day: 06/01 0701 - 06/02 0700 In: 970 [P.O.:920; IV Piggyback:50] Out: 1400 [Urine:1400] Intake/Output this shift: No intake/output data recorded.  Physical Exam: Work of breathing is normal.  Incisions are minimally sore  Lab Results:  Results for orders placed or performed during the hospital encounter of 08/13/16 (from the past 48 hour(s))  Vitamin B12     Status: Abnormal   Collection Time: 08/14/16  8:36 AM  Result Value Ref Range   Vitamin B-12 106 (L) 180 - 914 pg/mL    Comment: (NOTE) This assay is not validated for testing neonatal or myeloproliferative syndrome specimens for Vitamin B12 levels.   Folate     Status: None   Collection Time: 08/14/16  8:36 AM  Result Value Ref Range   Folate 10.0 >5.9 ng/mL  Iron and TIBC     Status: None   Collection Time: 08/14/16  8:36 AM  Result Value Ref Range   Iron 53 28 - 170 ug/dL   TIBC 332 250 - 450 ug/dL   Saturation Ratios 16 10.4 - 31.8 %   UIBC 279 ug/dL  Ferritin     Status: None   Collection Time: 08/14/16  8:36 AM  Result Value Ref Range   Ferritin 14 11 - 307 ng/mL  Glucose, capillary     Status: Abnormal   Collection Time: 08/14/16  6:32 PM  Result Value Ref Range   Glucose-Capillary 126 (H) 65 - 99 mg/dL  Glucose, capillary     Status: Abnormal   Collection Time: 08/15/16 12:16 AM  Result Value Ref Range   Glucose-Capillary 161 (H) 65 - 99 mg/dL   Comment 1 Notify RN    Comment 2 Document in Chart    Comprehensive metabolic panel     Status: Abnormal   Collection Time: 08/15/16  6:19 AM  Result Value Ref Range   Sodium 138 135 - 145 mmol/L   Potassium 3.7 3.5 - 5.1 mmol/L   Chloride 104 101 - 111 mmol/L   CO2 24 22 - 32 mmol/L   Glucose, Bld 130 (H) 65 - 99 mg/dL   BUN <5 (L) 6 - 20 mg/dL   Creatinine, Ser 0.64 0.44 - 1.00 mg/dL   Calcium 9.6 8.9 - 10.3 mg/dL   Total Protein 6.6 6.5 - 8.1 g/dL   Albumin 3.3 (L) 3.5 - 5.0 g/dL   AST 27 15 - 41 U/L   ALT 20 14 - 54 U/L   Alkaline Phosphatase 77 38 - 126 U/L   Total Bilirubin 0.6 0.3 - 1.2 mg/dL   GFR calc non Af Amer >60 >60 mL/min   GFR calc Af Amer >60 >60 mL/min    Comment: (NOTE) The eGFR has been calculated using the CKD EPI equation. This calculation has not been validated in all clinical situations. eGFR's persistently <60 mL/min signify possible Chronic Kidney Disease.    Anion gap 10 5 - 15  CBC with Differential/Platelet  Status: Abnormal   Collection Time: 08/15/16  6:19 AM  Result Value Ref Range   WBC 14.1 (H) 4.0 - 10.5 K/uL   RBC 4.11 3.87 - 5.11 MIL/uL   Hemoglobin 11.7 (L) 12.0 - 15.0 g/dL   HCT 36.0 36.0 - 46.0 %   MCV 87.6 78.0 - 100.0 fL   MCH 28.5 26.0 - 34.0 pg   MCHC 32.5 30.0 - 36.0 g/dL   RDW 13.9 11.5 - 15.5 %   Platelets 248 150 - 400 K/uL   Neutrophils Relative % 86 %   Neutro Abs 12.1 (H) 1.7 - 7.7 K/uL   Lymphocytes Relative 10 %   Lymphs Abs 1.5 0.7 - 4.0 K/uL   Monocytes Relative 4 %   Monocytes Absolute 0.5 0.1 - 1.0 K/uL   Eosinophils Relative 0 %   Eosinophils Absolute 0.0 0.0 - 0.7 K/uL   Basophils Relative 0 %   Basophils Absolute 0.0 0.0 - 0.1 K/uL  Glucose, capillary     Status: Abnormal   Collection Time: 08/15/16  7:27 AM  Result Value Ref Range   Glucose-Capillary 118 (H) 65 - 99 mg/dL  Glucose, capillary     Status: Abnormal   Collection Time: 08/15/16 12:09 PM  Result Value Ref Range   Glucose-Capillary 107 (H) 65 - 99 mg/dL  Glucose, capillary     Status:  Abnormal   Collection Time: 08/15/16  5:33 PM  Result Value Ref Range   Glucose-Capillary 117 (H) 65 - 99 mg/dL  Glucose, capillary     Status: Abnormal   Collection Time: 08/15/16 11:56 PM  Result Value Ref Range   Glucose-Capillary 124 (H) 65 - 99 mg/dL  CBC     Status: Abnormal   Collection Time: 08/16/16  5:16 AM  Result Value Ref Range   WBC 6.6 4.0 - 10.5 K/uL   RBC 4.14 3.87 - 5.11 MIL/uL   Hemoglobin 11.5 (L) 12.0 - 15.0 g/dL   HCT 36.9 36.0 - 46.0 %   MCV 89.1 78.0 - 100.0 fL   MCH 27.8 26.0 - 34.0 pg   MCHC 31.2 30.0 - 36.0 g/dL   RDW 13.9 11.5 - 15.5 %   Platelets 233 150 - 400 K/uL  Basic metabolic panel     Status: Abnormal   Collection Time: 08/16/16  5:16 AM  Result Value Ref Range   Sodium 133 (L) 135 - 145 mmol/L   Potassium 3.4 (L) 3.5 - 5.1 mmol/L   Chloride 98 (L) 101 - 111 mmol/L   CO2 23 22 - 32 mmol/L   Glucose, Bld 115 (H) 65 - 99 mg/dL   BUN 10 6 - 20 mg/dL   Creatinine, Ser 0.86 0.44 - 1.00 mg/dL   Calcium 9.3 8.9 - 10.3 mg/dL   GFR calc non Af Amer >60 >60 mL/min   GFR calc Af Amer >60 >60 mL/min    Comment: (NOTE) The eGFR has been calculated using the CKD EPI equation. This calculation has not been validated in all clinical situations. eGFR's persistently <60 mL/min signify possible Chronic Kidney Disease.    Anion gap 12 5 - 15  Glucose, capillary     Status: Abnormal   Collection Time: 08/16/16  7:36 AM  Result Value Ref Range   Glucose-Capillary 109 (H) 65 - 99 mg/dL    Radiology/Results: No results found.  Anti-infectives: Anti-infectives    Start     Dose/Rate Route Frequency Ordered Stop   08/15/16 1600  piperacillin-tazobactam (ZOSYN) IVPB  3.375 g     3.375 g 12.5 mL/hr over 240 Minutes Intravenous Every 8 hours 08/15/16 0853     08/15/16 1000  piperacillin-tazobactam (ZOSYN) IVPB 3.375 g     3.375 g 100 mL/hr over 30 Minutes Intravenous  Once 08/15/16 0853 08/15/16 1042   08/15/16 0900  piperacillin-tazobactam (ZOSYN) IVPB  3.375 g  Status:  Discontinued     3.375 g 100 mL/hr over 30 Minutes Intravenous Every 8 hours 08/15/16 0848 08/15/16 0851   08/14/16 1000  piperacillin-tazobactam (ZOSYN) IVPB 3.375 g  Status:  Discontinued     3.375 g 12.5 mL/hr over 240 Minutes Intravenous Every 8 hours 08/14/16 0145 08/14/16 1756   08/14/16 0200  piperacillin-tazobactam (ZOSYN) IVPB 3.375 g     3.375 g 100 mL/hr over 30 Minutes Intravenous STAT 08/14/16 0145 08/14/16 0530   08/14/16 0045  cefTRIAXone (ROCEPHIN) 2 g in dextrose 5 % 50 mL IVPB     2 g 100 mL/hr over 30 Minutes Intravenous  Once 08/14/16 0039 08/14/16 0231      Assessment/Plan: Problem List: Patient Active Problem List   Diagnosis Date Noted  . Vitamin B12 deficiency 08/15/2016  . Acute cholecystitis 08/14/2016  . GERD (gastroesophageal reflux disease) 04/24/2015  . Hypertensive urgency 04/23/2015  . Chest pain, unspecified   . Right leg swelling 03/07/2015  . Preventative health care 02/21/2015  . Esophageal reflux   . NSTEMI (non-ST elevated myocardial infarction) ([place]) 01/28/2015  . Nausea without vomiting 12/22/2014  . Dysuria 12/21/2014  . Diarrhea 09/26/2014  . Difficulty swallowing pills 09/26/2014  . Hemorrhoids 06/16/2014  . Elevated AST (SGOT) 03/27/2014  . Lumbar herniated disc 03/27/2014  . TIA (transient ischemic attack) 02/19/2014  . History of ischemic left PCA stroke 02/19/2014  . Palpitations 01/27/2014  . Depression 08/15/2013  . Long term current use of anticoagulant therapy 08/10/2013  . Gastritis and duodenitis 08/03/2013  . Diverticulosis 08/03/2013  . Fatty liver 08/03/2013  . Esophagitis 08/03/2013  . Hiatal hernia 08/03/2013  . Apical mural thrombus 08/02/2013  . GI bleeding 06/04/2012  . Antithrombin III deficiency ([place]) 06/04/2012  . Paresthesias 06/29/2011  . Hyperlipemia 06/07/2009  . Essential hypertension 05/17/2009  . OBESITY-MORBID (>100') 03/13/2008  . CAD S/P  BMS to proximal LAD 2009-patent  01/29/15 06/28/2007    Ok for discharge when anticoagulants right--[facility] will see back in the office 2 Days Post-Op    LOS: 2 days   Matt B. Hassell Done, MD, [facility] [phone] beeper [phone]  08/16/2016 8:24 AM

## 2017-06-23 ENCOUNTER — Other Ambulatory Visit: Payer: Self-pay

## 2017-06-23 ENCOUNTER — Emergency Department (HOSPITAL_COMMUNITY): Payer: Medicare Other

## 2017-06-23 ENCOUNTER — Encounter (HOSPITAL_COMMUNITY): Payer: Self-pay

## 2017-06-23 ENCOUNTER — Emergency Department (HOSPITAL_COMMUNITY)
Admission: EM | Admit: 2017-06-23 | Discharge: 2017-06-23 | Disposition: A | Discharge status: Home / Self Care | Payer: Medicare Other | Attending: Emergency Medicine | Admitting: Emergency Medicine

## 2017-06-23 DIAGNOSIS — Z7902 Long term (current) use of antithrombotics/antiplatelets: Secondary | ICD-10-CM | POA: Insufficient documentation

## 2017-06-23 DIAGNOSIS — I2510 Atherosclerotic heart disease of native coronary artery without angina pectoris: Secondary | ICD-10-CM | POA: Insufficient documentation

## 2017-06-23 DIAGNOSIS — F17210 Nicotine dependence, cigarettes, uncomplicated: Secondary | ICD-10-CM | POA: Insufficient documentation

## 2017-06-23 DIAGNOSIS — R1031 Right lower quadrant pain: Secondary | ICD-10-CM | POA: Diagnosis not present

## 2017-06-23 DIAGNOSIS — Z7901 Long term (current) use of anticoagulants: Secondary | ICD-10-CM | POA: Insufficient documentation

## 2017-06-23 DIAGNOSIS — N939 Abnormal uterine and vaginal bleeding, unspecified: Secondary | ICD-10-CM | POA: Insufficient documentation

## 2017-06-23 DIAGNOSIS — Z79899 Other long term (current) drug therapy: Secondary | ICD-10-CM | POA: Diagnosis not present

## 2017-06-23 DIAGNOSIS — I10 Essential (primary) hypertension: Secondary | ICD-10-CM | POA: Diagnosis not present

## 2017-06-23 DIAGNOSIS — R197 Diarrhea, unspecified: Secondary | ICD-10-CM | POA: Diagnosis not present

## 2017-06-23 DIAGNOSIS — R1110 Vomiting, unspecified: Secondary | ICD-10-CM | POA: Insufficient documentation

## 2017-06-23 LAB — URINALYSIS, ROUTINE W REFLEX MICROSCOPIC
Bilirubin Urine: NEGATIVE
Bilirubin Urine: NEGATIVE
GLUCOSE, UA: NEGATIVE mg/dL
Glucose, UA: NEGATIVE mg/dL
KETONES UR: NEGATIVE mg/dL
KETONES UR: NEGATIVE mg/dL
Nitrite: NEGATIVE
Nitrite: NEGATIVE
PH: 5 (ref 5.0–8.0)
PROTEIN: NEGATIVE mg/dL
Protein, ur: NEGATIVE mg/dL
Specific Gravity, Urine: 1.002 — ABNORMAL LOW (ref 1.005–1.030)
Specific Gravity, Urine: 1.002 — ABNORMAL LOW (ref 1.005–1.030)
pH: 5 (ref 5.0–8.0)

## 2017-06-23 LAB — COMPREHENSIVE METABOLIC PANEL
ALK PHOS: 117 U/L (ref 38–126)
ALT: 29 U/L (ref 14–54)
AST: 37 U/L (ref 15–41)
Albumin: 3.5 g/dL (ref 3.5–5.0)
Anion gap: 11 (ref 5–15)
BILIRUBIN TOTAL: 1 mg/dL (ref 0.3–1.2)
CALCIUM: 9.2 mg/dL (ref 8.9–10.3)
CO2: 20 mmol/L — ABNORMAL LOW (ref 22–32)
CREATININE: 0.6 mg/dL (ref 0.44–1.00)
Chloride: 102 mmol/L (ref 101–111)
Glucose, Bld: 102 mg/dL — ABNORMAL HIGH (ref 65–99)
Potassium: 3.8 mmol/L (ref 3.5–5.1)
Sodium: 133 mmol/L — ABNORMAL LOW (ref 135–145)
TOTAL PROTEIN: 6.9 g/dL (ref 6.5–8.1)

## 2017-06-23 LAB — WET PREP, GENITAL
Clue Cells Wet Prep HPF POC: NONE SEEN
SPERM: NONE SEEN
TRICH WET PREP: NONE SEEN
YEAST WET PREP: NONE SEEN

## 2017-06-23 LAB — CBC
HCT: 39.5 % (ref 36.0–46.0)
Hemoglobin: 12.8 g/dL (ref 12.0–15.0)
MCH: 29.2 pg (ref 26.0–34.0)
MCHC: 32.4 g/dL (ref 30.0–36.0)
MCV: 90.2 fL (ref 78.0–100.0)
PLATELETS: 184 10*3/uL (ref 150–400)
RBC: 4.38 MIL/uL (ref 3.87–5.11)
RDW: 13.2 % (ref 11.5–15.5)
WBC: 12.6 10*3/uL — AB (ref 4.0–10.5)

## 2017-06-23 LAB — URINALYSIS, MICROSCOPIC (REFLEX)

## 2017-06-23 LAB — I-STAT BETA HCG BLOOD, ED (MC, WL, AP ONLY)

## 2017-06-23 LAB — LIPASE, BLOOD: Lipase: 20 U/L (ref 11–51)

## 2017-06-23 MED ORDER — HYDROCODONE-ACETAMINOPHEN 5-325 MG PO TABS: 1.0000 | ORAL_TABLET | Freq: Four times a day (QID) | ORAL | 0 refills | Status: DC | PRN

## 2017-06-23 MED ORDER — ONDANSETRON 4 MG PO TBDP
8.0000 mg | ORAL_TABLET | Freq: Once | ORAL | Status: AC
  Administered 2017-06-23: 8 mg via ORAL
  Filled 2017-06-23: qty 2

## 2017-06-23 MED ORDER — MORPHINE SULFATE (PF) 4 MG/ML IV SOLN
4.0000 mg | Freq: Once | INTRAVENOUS | Status: AC
  Administered 2017-06-23: 4 mg via INTRAVENOUS
  Filled 2017-06-23: qty 1

## 2017-06-23 MED ORDER — CEFTRIAXONE SODIUM 250 MG IJ SOLR
250.0000 mg | Freq: Once | INTRAMUSCULAR | Status: AC
  Administered 2017-06-23: 250 mg via INTRAMUSCULAR
  Filled 2017-06-23: qty 250

## 2017-06-23 MED ORDER — DEXTROSE 5 % IV SOLN: 250.0000 mg | Freq: Once | INTRAVENOUS | Status: DC

## 2017-06-23 MED ORDER — IOPAMIDOL (ISOVUE-300) INJECTION 61%
INTRAVENOUS | Status: AC
  Filled 2017-06-23: qty 100

## 2017-06-23 MED ORDER — KETOROLAC TROMETHAMINE 15 MG/ML IJ SOLN
15.0000 mg | Freq: Once | INTRAMUSCULAR | Status: AC
  Administered 2017-06-23: 15 mg via INTRAVENOUS
  Filled 2017-06-23: qty 1

## 2017-06-23 MED ORDER — DOXYCYCLINE HYCLATE 100 MG PO TABS
100.0000 mg | ORAL_TABLET | Freq: Once | ORAL | Status: AC
  Administered 2017-06-23: 100 mg via ORAL
  Filled 2017-06-23: qty 1

## 2017-06-23 MED ORDER — METRONIDAZOLE 500 MG PO TABS
500.0000 mg | ORAL_TABLET | Freq: Once | ORAL | Status: AC
  Administered 2017-06-23: 500 mg via ORAL
  Filled 2017-06-23: qty 1

## 2017-06-23 MED ORDER — DOXYCYCLINE HYCLATE 100 MG PO CAPS: 100.0000 mg | ORAL_CAPSULE | Freq: Two times a day (BID) | ORAL | 0 refills | Status: DC

## 2017-06-23 MED ORDER — METRONIDAZOLE 500 MG PO TABS: 500.0000 mg | ORAL_TABLET | Freq: Two times a day (BID) | ORAL | 0 refills | Status: DC

## 2017-06-23 MED ORDER — LIDOCAINE HCL (PF) 1 % IJ SOLN
INTRAMUSCULAR | Status: AC
  Administered 2017-06-23: 2.1 mL
  Filled 2017-06-23: qty 5

## 2017-06-23 MED ORDER — IOPAMIDOL (ISOVUE-300) INJECTION 61%
100.0000 mL | Freq: Once | INTRAVENOUS | Status: AC | PRN
  Administered 2017-06-23: 100 mL via INTRAVENOUS

## 2017-06-23 NOTE — ED Notes (Signed)
Patient returned from U/S - reports increased pain after test. MD aware, see order.

## 2017-06-23 NOTE — Discharge Instructions (Addendum)
Your ultrasound showed a small amount of fluid in your right fallopian tube, this may be causing your symptoms. Please follow-up with the women's outpatient follow-up for recheck in the next week. Get seen immediately if you develop worsening pain, high fevers or new concerning symptoms.

## 2017-06-23 NOTE — ED Triage Notes (Signed)
Patient complains of RLQ abdominal pain with heavy vaginal bleeding x 2 days. States that she has not hade periods in years, this am bleeding decreased but ongoing pain. denies dysuria.

## 2017-06-23 NOTE — ED Notes (Signed)
Patient transported to US 

## 2017-06-23 NOTE — ED Notes (Signed)
PT states understanding of care given, follow up care, and medication prescribed. PT ambulated from ED to car with a steady gait. 

## 2017-06-23 NOTE — ED Provider Notes (Signed)
[facility] EMERGENCY DEPARTMENT Provider Note   CSN: [id number] Arrival date & time: 06/23/17  1000     History   Chief Complaint Chief Complaint  Patient presents with  . RLQ/ vaginal bleeding    HPI Lindsey [name] is a 47 y.o. female.  The history is provided by the patient. No language interpreter was used.   Lindsey [name] is a 47 y.o. female who presents to the Emergency Department complaining of abdominal pain, vomiting. She reports 2 to 3 days of right lower quadrant abdominal pain. Yesterday she developed heavy vaginally bleeding passing clots. She reports using 1 pad yesterday. Today her bleeding has significantly lessened but she has worsening pain. She has not had a menstrual cycle for several years. Pain is constant and worse with movement. She cannot find a comfortable position. Today she reports associated vomiting and diarrhea. She has a history of CVA and MI secondary to antiphospholipid syndrome and she is on chronic Xarelto therapy. She has a history of prior cholecystectomy. No additional medical problems. She denies any vaginal discharge or recent new sexual partners.  Not recently sexually active. Past Medical History:  Diagnosis Date  . Anemia   . Antithrombin III deficiency (HCC)    ?pt not sure if true diagnosis  . Anxiety   . Apical mural thrombus   . Blood transfusion   . Cholecystitis 07/2016  . Coronary artery disease    a. apical LAD infarction '00. b. NSTEMI s/p BMS to prox LAD '09. c. Cath 01/2015: stable LAD stent, otherwise minimal nonobstructive CAD.  Marland Kitchen GERD (gastroesophageal reflux disease)   . Hyperlipidemia   . Hypertension   . Myocardial infarction (HCC)   . Noncompliance with medication regimen    a. h/o noncompliance with med regimen (previous running out of Coumadin).  . Stroke (HCC)    assoc with short term memory loss and right peripheral vision loss; age 24   . TIA (transient ischemic attack) 2010  . Tobacco  abuse     Patient Active Problem List   Diagnosis Date Noted  . Vitamin B12 deficiency 08/15/2016  . Acute cholecystitis 08/14/2016  . GERD (gastroesophageal reflux disease) 04/24/2015  . Hypertensive urgency 04/23/2015  . Chest pain, unspecified   . Right leg swelling 03/07/2015  . Preventative health care 02/21/2015  . Esophageal reflux   . NSTEMI (non-ST elevated myocardial infarction) (HCC) 01/28/2015  . Nausea without vomiting 12/22/2014  . Dysuria 12/21/2014  . Diarrhea 09/26/2014  . Difficulty swallowing pills 09/26/2014  . Hemorrhoids 06/16/2014  . Elevated AST (SGOT) 03/27/2014  . Lumbar herniated disc 03/27/2014  . TIA (transient ischemic attack) 02/19/2014  . History of ischemic left PCA stroke 02/19/2014  . Palpitations 01/27/2014  . Depression 08/15/2013  . Long term current use of anticoagulant therapy 08/10/2013  . Gastritis and duodenitis 08/03/2013  . Diverticulosis 08/03/2013  . Fatty liver 08/03/2013  . Esophagitis 08/03/2013  . Hiatal hernia 08/03/2013  . Apical mural thrombus 08/02/2013  . GI bleeding 06/04/2012  . Antithrombin III deficiency (HCC) 06/04/2012  . Paresthesias 06/29/2011  . Hyperlipemia 06/07/2009  . Essential hypertension 05/17/2009  . OBESITY-MORBID (>100') 03/13/2008  . CAD S/P  BMS to proximal LAD 2009-patent 01/29/15 06/28/2007    Past Surgical History:  Procedure Laterality Date  . CARDIAC CATHETERIZATION    . CARDIAC CATHETERIZATION N/A 01/29/2015   Procedure: Left Heart Cath and Coronary Angiography;  Surgeon: Peter M Swaziland, MD;  Location: [facility] INVASIVE CV LAB;  Service: Cardiovascular;  Laterality: N/A;  . CHOLECYSTECTOMY N/A 08/14/2016   Procedure: LAPAROSCOPIC CHOLECYSTECTOMY;  Surgeon: Gaynelle Adu, MD;  Location: [facility] OR;  Service: General;  Laterality: N/A;  . COLONOSCOPY N/A 03/29/2014   Procedure: COLONOSCOPY;  Surgeon: Louis Meckel, MD;  Location: [facility] ENDOSCOPY;  Service: Endoscopy;  Laterality: N/A;  . CORONARY  ANGIOPLASTY    . ESOPHAGOGASTRODUODENOSCOPY N/A 06/09/2012   Procedure: ESOPHAGOGASTRODUODENOSCOPY (EGD);  Surgeon: Theda Belfast, MD;  Location: [facility] ENDOSCOPY;  Service: Endoscopy;  Laterality: N/A;  . ESOPHAGOGASTRODUODENOSCOPY N/A 08/02/2013   Procedure: ESOPHAGOGASTRODUODENOSCOPY (EGD);  Surgeon: Meryl Dare, MD;  Location: [facility] ENDOSCOPY;  Service: Endoscopy;  Laterality: N/A;  . LEFT HEART CATHETERIZATION WITH CORONARY ANGIOGRAM N/A 12/24/2011   Procedure: LEFT HEART CATHETERIZATION WITH CORONARY ANGIOGRAM;  Surgeon: Kathleene Hazel, MD;  Location: [facility] CATH LAB;  Service: Cardiovascular;  Laterality: N/A;  . TUBAL LIGATION       OB History   None      Home Medications    Prior to Admission medications   Medication Sig Start Date End Date Taking? Authorizing Provider  acetaminophen (TYLENOL) 500 MG tablet Take 1,000 mg by mouth every 6 (six) hours as needed for headache (pain).    [provider]  atorvastatin (LIPITOR) 40 MG tablet Take 1 tablet (40 mg total) by mouth daily. 08/16/16   Rodolph Bong, MD  carvedilol (COREG) 25 MG tablet Take 1 tablet (25 mg total) by mouth 2 (two) times daily with a meal. 08/16/16   Rodolph Bong, MD  clopidogrel (PLAVIX) 75 MG tablet Take 1 tablet (75 mg total) by mouth daily. 08/16/16   Rodolph Bong, MD  docusate sodium (COLACE) 100 MG capsule Take 1 capsule (100 mg total) by mouth 2 (two) times daily. 08/16/16   Rodolph Bong, MD  doxycycline (VIBRAMYCIN) 100 MG capsule Take 1 capsule (100 mg total) by mouth 2 (two) times daily. 06/23/17   Tilden Fossa, MD  HYDROcodone-acetaminophen (NORCO/VICODIN) 5-325 MG tablet Take 1 tablet by mouth every 6 (six) hours as needed. 06/23/17   Tilden Fossa, MD  hydrocortisone (ANUSOL-HC) 2.5 % rectal cream Apply rectally 2 times daily prn Patient not taking: Reported on 08/14/2016 07/21/15   Cathren Laine, MD  methocarbamol (ROBAXIN) 500 MG tablet Take 1 tablet (500 mg total) by mouth 3  (three) times daily. 08/16/16   Rodolph Bong, MD  metroNIDAZOLE (FLAGYL) 500 MG tablet Take 1 tablet (500 mg total) by mouth 2 (two) times daily. 06/23/17   Tilden Fossa, MD  nitroGLYCERIN (NITROSTAT) 0.4 MG SL tablet Place 1 tablet (0.4 mg total) under the tongue every 5 (five) minutes as needed for chest pain (up to 3 doses). 08/16/16   Rodolph Bong, MD  ondansetron (ZOFRAN ODT) 8 MG disintegrating tablet Take 1 tablet (8 mg total) by mouth every 8 (eight) hours as needed for nausea or vomiting. 08/16/16   Rodolph Bong, MD  oxyCODONE (OXY IR/ROXICODONE) 5 MG immediate release tablet Take 1-2 tablets (5-10 mg total) by mouth every 4 (four) hours as needed for moderate pain. 08/16/16   Rodolph Bong, MD  pantoprazole (PROTONIX) 40 MG tablet Take 1 tablet (40 mg total) by mouth daily. 08/16/16   Rodolph Bong, MD  rivaroxaban (XARELTO) 20 MG TABS tablet Take 1 tablet (20 mg total) by mouth daily with supper. 08/16/16   Rodolph Bong, MD  vitamin B-12 (CYANOCOBALAMIN) 1000 MCG tablet Take 1 tablet (1,000 mcg total) by mouth  daily. 08/16/16   Rodolph Bong, MD    Family History Family History  Problem Relation Age of Onset  . Heart disease Brother        arrhythmia; died  . Breast cancer Maternal Aunt     Social History Social History   Tobacco Use  . Smoking status: Current Every Day Smoker    Packs/day: 0.20    Years: 1.00    Pack years: 0.20    Types: Cigarettes  . Smokeless tobacco: Never Used  . Tobacco comment: 2018  " i STILL SMOKE 3  CIGARETTES A DAY "  Substance Use Topics  . Alcohol use: Yes    Alcohol/week: 0.0 oz    Comment: Social drinker  . Drug use: Yes    Types: Cocaine    Comment: hx of use x 1     Allergies   Patient has no known allergies.   Review of Systems Review of Systems  All other systems reviewed and are negative.    Physical Exam Updated Vital Signs BP (!) 151/97   Pulse 71   Temp 99.5 F (37.5 C) (Oral)   Resp  16   SpO2 97%   Physical Exam  Constitutional: She is oriented to person, place, and time. She appears well-developed and well-nourished.  HENT:  Head: Normocephalic and atraumatic.  Cardiovascular: Regular rhythm.  No murmur heard. Tachycardic  Pulmonary/Chest: Effort normal and breath sounds normal. No respiratory distress.  Abdominal: Soft. There is no rebound and no guarding.  Mild generalized abdominal tenderness, moderate RLQ tenderness  Genitourinary:  Genitourinary Comments: Scant vaginal discharge.  No CMT.  Mild to moderate right adnexal tenderness without palpable mass.  Os closed.    Musculoskeletal: She exhibits no edema or tenderness.  Neurological: She is alert and oriented to person, place, and time.  Skin: Skin is warm and dry.  Psychiatric: She has a normal mood and affect. Her behavior is normal.  Nursing note and vitals reviewed.    ED Treatments / Results  Labs (all labs ordered are listed, but only abnormal results are displayed) Labs Reviewed  WET PREP, GENITAL - Abnormal; Notable for the following components:      Result Value   WBC, Wet Prep HPF POC FEW (*)    All other components within normal limits  COMPREHENSIVE METABOLIC PANEL - Abnormal; Notable for the following components:   Sodium 133 (*)    CO2 20 (*)    Glucose, Bld 102 (*)    BUN <5 (*)    All other components within normal limits  CBC - Abnormal; Notable for the following components:   WBC 12.6 (*)    All other components within normal limits  URINALYSIS, ROUTINE W REFLEX MICROSCOPIC - Abnormal; Notable for the following components:   APPearance HAZY (*)    Specific Gravity, Urine 1.002 (*)    Hgb urine dipstick MODERATE (*)    Leukocytes, UA MODERATE (*)    All other components within normal limits  URINALYSIS, ROUTINE W REFLEX MICROSCOPIC - Abnormal; Notable for the following components:   APPearance HAZY (*)    Specific Gravity, Urine 1.002 (*)    Hgb urine dipstick MODERATE  (*)    Leukocytes, UA MODERATE (*)    All other components within normal limits  URINALYSIS, MICROSCOPIC (REFLEX) - Abnormal; Notable for the following components:   Bacteria, UA RARE (*)    Squamous Epithelial / LPF 6-30 (*)    All other components within normal  limits  LIPASE, BLOOD  I-STAT BETA HCG BLOOD, ED ([facility], [facility], [facility] ONLY)  GC/CHLAMYDIA PROBE AMP ([place]) NOT AT [facility]    EKG None  Radiology US Transvaginal Non-ob  Result Date: 06/23/2017 CLINICAL DATA:  47 year old female with right lower quadrant, right pelvic pain. Symptoms for 3 days. Prior tubal ligation. The patient reports being postmenopausal. Not on hormone replacement therapy. EXAM: TRANSABDOMINAL AND TRANSVAGINAL ULTRASOUND OF PELVIS DOPPLER ULTRASOUND OF OVARIES TECHNIQUE: Both transabdominal and transvaginal ultrasound examinations of the pelvis were performed. Transabdominal technique was performed for global imaging of the pelvis including uterus, ovaries, adnexal regions, and pelvic cul-de-sac. It was necessary to proceed with endovaginal exam following the transabdominal exam to visualize the ovaries. Color and duplex Doppler ultrasound was utilized to evaluate blood flow to the ovaries. COMPARISON:  CT Abdomen and Pelvis 08/13/2016 FINDINGS: Uterus Measurements: 10.3 by 6.2 by 6.0 centimeters. Small round 1.8 centimeter area of altered echogenicity at the uterine fundus seen on image 73 may be a small fibroid. There is a 2nd more cystic appearing area along the lower uterine segment with increased through transmission (image 61) measuring 11 millimeters. Endometrium Thickness: 13 millimeters.  No focal abnormality visualized. Right ovary Measurements: 2.7 x 1.9 x 2.3 centimeters. There is a simple appearing 1.4 centimeter cyst (image 107). Normal appearance/no adnexal mass. Left ovary Measurements: 3.0 x 1.8 x 2.4 centimeters. There is a small simple appearing 2.1 centimeter cyst (image 92). Normal appearance/no adnexal  mass. Pulsed Doppler evaluation of both ovaries demonstrates normal low-resistance arterial and venous waveforms. Other findings No pelvic free fluid. A tubular hypoechoic area is identified along the right lateral aspect of the uterus near the adnexa on image 118. IMPRESSION: 1. Negative for ovarian torsion. 2. Normal appearance of both ovaries, with a small simple cyst or follicle on each side, but there is a tubular hypoechoic area along the right margin of the uterus suspicious for Hydrosalpinx. See image 118. 3. Small 1.8 cm fibroid suspected at the uterine fundus. A smaller 1.1 cm more cystic areas seen along the lower uterine segment might be a partially cystic fibroid or atypical appearing nabothian cyst. Electronically Signed   By: Lindsey [name] M.D.   On: 06/23/2017 18:10   US Pelvis Complete  Result Date: 06/23/2017 CLINICAL DATA:  47 year old female with right lower quadrant, right pelvic pain. Symptoms for 3 days. Prior tubal ligation. The patient reports being postmenopausal. Not on hormone replacement therapy. EXAM: TRANSABDOMINAL AND TRANSVAGINAL ULTRASOUND OF PELVIS DOPPLER ULTRASOUND OF OVARIES TECHNIQUE: Both transabdominal and transvaginal ultrasound examinations of the pelvis were performed. Transabdominal technique was performed for global imaging of the pelvis including uterus, ovaries, adnexal regions, and pelvic cul-de-sac. It was necessary to proceed with endovaginal exam following the transabdominal exam to visualize the ovaries. Color and duplex Doppler ultrasound was utilized to evaluate blood flow to the ovaries. COMPARISON:  CT Abdomen and Pelvis 08/13/2016 FINDINGS: Uterus Measurements: 10.3 by 6.2 by 6.0 centimeters. Small round 1.8 centimeter area of altered echogenicity at the uterine fundus seen on image 73 may be a small fibroid. There is a 2nd more cystic appearing area along the lower uterine segment with increased through transmission (image 61) measuring 11 millimeters.  Endometrium Thickness: 13 millimeters.  No focal abnormality visualized. Right ovary Measurements: 2.7 x 1.9 x 2.3 centimeters. There is a simple appearing 1.4 centimeter cyst (image 107). Normal appearance/no adnexal mass. Left ovary Measurements: 3.0 x 1.8 x 2.4 centimeters. There is a small simple appearing 2.1 centimeter  cyst (image 92). Normal appearance/no adnexal mass. Pulsed Doppler evaluation of both ovaries demonstrates normal low-resistance arterial and venous waveforms. Other findings No pelvic free fluid. A tubular hypoechoic area is identified along the right lateral aspect of the uterus near the adnexa on image 118. IMPRESSION: 1. Negative for ovarian torsion. 2. Normal appearance of both ovaries, with a small simple cyst or follicle on each side, but there is a tubular hypoechoic area along the right margin of the uterus suspicious for Hydrosalpinx. See image 118. 3. Small 1.8 cm fibroid suspected at the uterine fundus. A smaller 1.1 cm more cystic areas seen along the lower uterine segment might be a partially cystic fibroid or atypical appearing nabothian cyst. Electronically Signed   By: Lindsey [name] M.D.   On: 06/23/2017 18:10   Ct Abdomen Pelvis W Contrast  Result Date: 06/23/2017 CLINICAL DATA:  Right lower quadrant pain with nausea, vomiting, and diarrhea for 2 days. EXAM: CT ABDOMEN AND PELVIS WITH CONTRAST TECHNIQUE: Multidetector CT imaging of the abdomen and pelvis was performed using the standard protocol following bolus administration of intravenous contrast. CONTRAST:  ISOVUE-300 IOPAMIDOL (ISOVUE-300) INJECTION 61% COMPARISON:  Pelvic ultrasound earlier today. CT abdomen and pelvis 08/13/2016. FINDINGS: Lower chest: Clear lung bases. Hepatobiliary: No focal liver abnormality is seen. Status post cholecystectomy. No biliary dilatation. Pancreas: Unremarkable. Spleen: Unremarkable. Adrenals/Urinary Tract: Unremarkable adrenal glands. No evidence of renal mass, calculi, or  hydronephrosis. Unremarkable bladder. Stomach/Bowel: The stomach is within normal limits. There is no evidence of bowel obstruction or inflammation. Mild colonic diverticulosis is noted without evidence of diverticulitis. The appendix is unremarkable. Vascular/Lymphatic: Mild abdominal aortic atherosclerosis without aneurysm. No enlarged lymph nodes. Reproductive: Tubal ligation clips are noted. Detailed assessment of the uterus and ovaries is deferred to today's earlier pelvic ultrasound. Other: No intraperitoneal free fluid. Musculoskeletal: No acute osseous abnormality or suspicious osseous lesion. IMPRESSION: 1. No acute abnormality identified in the abdomen or pelvis. Normal appendix. 2.  Aortic Atherosclerosis (ICD10-I70.0). Electronically Signed   By: Sebastian Ache M.D.   On: 06/23/2017 19:28   [place] Art/ven Flow Abd Pelv Doppler  Result Date: 06/23/2017 CLINICAL DATA:  47 year old female with right lower quadrant, right pelvic pain. Symptoms for 3 days. Prior tubal ligation. The patient reports being postmenopausal. Not on hormone replacement therapy. EXAM: TRANSABDOMINAL AND TRANSVAGINAL ULTRASOUND OF PELVIS DOPPLER ULTRASOUND OF OVARIES TECHNIQUE: Both transabdominal and transvaginal ultrasound examinations of the pelvis were performed. Transabdominal technique was performed for global imaging of the pelvis including uterus, ovaries, adnexal regions, and pelvic cul-de-sac. It was necessary to proceed with endovaginal exam following the transabdominal exam to visualize the ovaries. Color and duplex Doppler ultrasound was utilized to evaluate blood flow to the ovaries. COMPARISON:  CT Abdomen and Pelvis 08/13/2016 FINDINGS: Uterus Measurements: 10.3 by 6.2 by 6.0 centimeters. Small round 1.8 centimeter area of altered echogenicity at the uterine fundus seen on image 73 may be a small fibroid. There is a 2nd more cystic appearing area along the lower uterine segment with increased through transmission (image  61) measuring 11 millimeters. Endometrium Thickness: 13 millimeters.  No focal abnormality visualized. Right ovary Measurements: 2.7 x 1.9 x 2.3 centimeters. There is a simple appearing 1.4 centimeter cyst (image 107). Normal appearance/no adnexal mass. Left ovary Measurements: 3.0 x 1.8 x 2.4 centimeters. There is a small simple appearing 2.1 centimeter cyst (image 92). Normal appearance/no adnexal mass. Pulsed Doppler evaluation of both ovaries demonstrates normal low-resistance arterial and venous waveforms. Other findings No pelvic free  fluid. A tubular hypoechoic area is identified along the right lateral aspect of the uterus near the adnexa on image 118. IMPRESSION: 1. Negative for ovarian torsion. 2. Normal appearance of both ovaries, with a small simple cyst or follicle on each side, but there is a tubular hypoechoic area along the right margin of the uterus suspicious for Hydrosalpinx. See image 118. 3. Small 1.8 cm fibroid suspected at the uterine fundus. A smaller 1.1 cm more cystic areas seen along the lower uterine segment might be a partially cystic fibroid or atypical appearing nabothian cyst. Electronically Signed   By: Lindsey [name] M.D.   On: 06/23/2017 18:10    Procedures Procedures (including critical care time)  Medications Ordered in ED Medications  morphine 4 MG/ML injection 4 mg (4 mg Intravenous Given 06/23/17 1625)  ondansetron (ZOFRAN-ODT) disintegrating tablet 8 mg (8 mg Oral Given 06/23/17 1625)  morphine 4 MG/ML injection 4 mg (4 mg Intravenous Given 06/23/17 1821)  iopamidol (ISOVUE-300) 61 % injection 100 mL (100 mLs Intravenous Contrast Given 06/23/17 1907)  ketorolac (TORADOL) 15 MG/ML injection 15 mg (15 mg Intravenous Given 06/23/17 2025)  doxycycline (VIBRA-TABS) tablet 100 mg (100 mg Oral Given 06/23/17 2026)  metroNIDAZOLE (FLAGYL) tablet 500 mg (500 mg Oral Given 06/23/17 2026)  cefTRIAXone (ROCEPHIN) injection 250 mg (250 mg Intramuscular Given 06/23/17 2039)  lidocaine (PF)  (XYLOCAINE) 1 % injection (2.1 mLs  Given 06/23/17 2039)     Initial Impression / Assessment and Plan / ED Course  I have reviewed the triage vital signs and the nursing notes.  Pertinent labs & imaging results that were available during my care of the patient were reviewed by me and considered in my medical decision making (see chart for details).     Patient here for evaluation of right lower quadrant abdominal pain, vaginally bleeding at home. She does have abdominal tenderness on examination without peritoneal findings. Pelvic examination is benign without any active bleeding, scant discharge.  Pelvic ultrasound with right hydrosalpinx.  CT abd negative for acute appendicitis. Tubo ovarian abscess is unlikely in this patient but given her focal tenderness as well as Hydro toppings will treat for possible pelvic infection. Discussed with patient importance of OB/GYN follow-up as well as home care and return precautions.  Final Clinical Impressions(s) / ED Diagnoses   Final diagnoses:  RLQ abdominal pain    ED Discharge Orders        Ordered    doxycycline (VIBRAMYCIN) 100 MG capsule  2 times daily     06/23/17 2017    metroNIDAZOLE (FLAGYL) 500 MG tablet  2 times daily     06/23/17 2017    HYDROcodone-acetaminophen (NORCO/VICODIN) 5-325 MG tablet  Every 6 hours PRN     06/23/17 2017       Tilden Fossa, MD 06/24/17 0002

## 2017-06-24 LAB — GC/CHLAMYDIA PROBE AMP (~~LOC~~) NOT AT ARMC
CHLAMYDIA, DNA PROBE: NEGATIVE
NEISSERIA GONORRHEA: NEGATIVE

## 2017-07-08 ENCOUNTER — Other Ambulatory Visit: Payer: Self-pay

## 2017-07-08 ENCOUNTER — Encounter (HOSPITAL_COMMUNITY)
Admission: EM | Disposition: A | Discharge status: Rehabilitation Facility | Payer: Self-pay | Source: Home / Self Care | Attending: Neurology

## 2017-07-08 ENCOUNTER — Encounter (HOSPITAL_COMMUNITY): Payer: Self-pay

## 2017-07-08 ENCOUNTER — Inpatient Hospital Stay (HOSPITAL_COMMUNITY): Payer: Medicare Other

## 2017-07-08 ENCOUNTER — Inpatient Hospital Stay (HOSPITAL_COMMUNITY)
Admission: EM | Admit: 2017-07-08 | Discharge: 2017-07-13 | DRG: 064 | Disposition: A | Discharge status: Rehabilitation Facility | Payer: Medicare Other | Attending: Neurology | Admitting: Neurology

## 2017-07-08 ENCOUNTER — Inpatient Hospital Stay (HOSPITAL_COMMUNITY): Payer: Medicare Other | Admitting: Anesthesiology

## 2017-07-08 ENCOUNTER — Emergency Department (HOSPITAL_COMMUNITY): Payer: Medicare Other

## 2017-07-08 DIAGNOSIS — R29703 NIHSS score 3: Secondary | ICD-10-CM | POA: Diagnosis not present

## 2017-07-08 DIAGNOSIS — R4781 Slurred speech: Secondary | ICD-10-CM | POA: Diagnosis present

## 2017-07-08 DIAGNOSIS — I63031 Cerebral infarction due to thrombosis of right carotid artery: Secondary | ICD-10-CM | POA: Diagnosis present

## 2017-07-08 DIAGNOSIS — I63511 Cerebral infarction due to unspecified occlusion or stenosis of right middle cerebral artery: Principal | ICD-10-CM | POA: Diagnosis present

## 2017-07-08 DIAGNOSIS — D6859 Other primary thrombophilia: Secondary | ICD-10-CM | POA: Diagnosis not present

## 2017-07-08 DIAGNOSIS — G441 Vascular headache, not elsewhere classified: Secondary | ICD-10-CM | POA: Diagnosis present

## 2017-07-08 DIAGNOSIS — Z9851 Tubal ligation status: Secondary | ICD-10-CM | POA: Diagnosis not present

## 2017-07-08 DIAGNOSIS — Z7902 Long term (current) use of antithrombotics/antiplatelets: Secondary | ICD-10-CM | POA: Diagnosis not present

## 2017-07-08 DIAGNOSIS — Z7901 Long term (current) use of anticoagulants: Secondary | ICD-10-CM

## 2017-07-08 DIAGNOSIS — Z9114 Patient's other noncompliance with medication regimen: Secondary | ICD-10-CM | POA: Diagnosis not present

## 2017-07-08 DIAGNOSIS — R29701 NIHSS score 1: Secondary | ICD-10-CM | POA: Diagnosis not present

## 2017-07-08 DIAGNOSIS — I513 Intracardiac thrombosis, not elsewhere classified: Secondary | ICD-10-CM | POA: Diagnosis present

## 2017-07-08 DIAGNOSIS — Z9861 Coronary angioplasty status: Secondary | ICD-10-CM | POA: Diagnosis not present

## 2017-07-08 DIAGNOSIS — Z79899 Other long term (current) drug therapy: Secondary | ICD-10-CM

## 2017-07-08 DIAGNOSIS — I69398 Other sequelae of cerebral infarction: Secondary | ICD-10-CM | POA: Diagnosis not present

## 2017-07-08 DIAGNOSIS — I2510 Atherosclerotic heart disease of native coronary artery without angina pectoris: Secondary | ICD-10-CM | POA: Diagnosis present

## 2017-07-08 DIAGNOSIS — T45516D Underdosing of anticoagulants, subsequent encounter: Secondary | ICD-10-CM | POA: Diagnosis not present

## 2017-07-08 DIAGNOSIS — R29704 NIHSS score 4: Secondary | ICD-10-CM | POA: Diagnosis present

## 2017-07-08 DIAGNOSIS — Z8673 Personal history of transient ischemic attack (TIA), and cerebral infarction without residual deficits: Secondary | ICD-10-CM | POA: Diagnosis not present

## 2017-07-08 DIAGNOSIS — F17200 Nicotine dependence, unspecified, uncomplicated: Secondary | ICD-10-CM

## 2017-07-08 DIAGNOSIS — K219 Gastro-esophageal reflux disease without esophagitis: Secondary | ICD-10-CM | POA: Diagnosis present

## 2017-07-08 DIAGNOSIS — E876 Hypokalemia: Secondary | ICD-10-CM | POA: Diagnosis present

## 2017-07-08 DIAGNOSIS — Z9049 Acquired absence of other specified parts of digestive tract: Secondary | ICD-10-CM

## 2017-07-08 DIAGNOSIS — I69311 Memory deficit following cerebral infarction: Secondary | ICD-10-CM

## 2017-07-08 DIAGNOSIS — H547 Unspecified visual loss: Secondary | ICD-10-CM | POA: Diagnosis present

## 2017-07-08 DIAGNOSIS — H538 Other visual disturbances: Secondary | ICD-10-CM | POA: Diagnosis present

## 2017-07-08 DIAGNOSIS — I252 Old myocardial infarction: Secondary | ICD-10-CM | POA: Diagnosis not present

## 2017-07-08 DIAGNOSIS — I10 Essential (primary) hypertension: Secondary | ICD-10-CM | POA: Diagnosis present

## 2017-07-08 DIAGNOSIS — R29810 Facial weakness: Secondary | ICD-10-CM | POA: Diagnosis not present

## 2017-07-08 DIAGNOSIS — I639 Cerebral infarction, unspecified: Secondary | ICD-10-CM | POA: Insufficient documentation

## 2017-07-08 DIAGNOSIS — I69354 Hemiplegia and hemiparesis following cerebral infarction affecting left non-dominant side: Secondary | ICD-10-CM | POA: Diagnosis not present

## 2017-07-08 DIAGNOSIS — I69322 Dysarthria following cerebral infarction: Secondary | ICD-10-CM | POA: Diagnosis not present

## 2017-07-08 DIAGNOSIS — R29705 NIHSS score 5: Secondary | ICD-10-CM | POA: Diagnosis not present

## 2017-07-08 DIAGNOSIS — F329 Major depressive disorder, single episode, unspecified: Secondary | ICD-10-CM | POA: Diagnosis present

## 2017-07-08 DIAGNOSIS — F4321 Adjustment disorder with depressed mood: Secondary | ICD-10-CM | POA: Diagnosis not present

## 2017-07-08 DIAGNOSIS — E785 Hyperlipidemia, unspecified: Secondary | ICD-10-CM | POA: Diagnosis not present

## 2017-07-08 DIAGNOSIS — I25119 Atherosclerotic heart disease of native coronary artery with unspecified angina pectoris: Secondary | ICD-10-CM | POA: Diagnosis not present

## 2017-07-08 DIAGNOSIS — D62 Acute posthemorrhagic anemia: Secondary | ICD-10-CM | POA: Diagnosis not present

## 2017-07-08 DIAGNOSIS — I63411 Cerebral infarction due to embolism of right middle cerebral artery: Secondary | ICD-10-CM | POA: Diagnosis not present

## 2017-07-08 DIAGNOSIS — F17210 Nicotine dependence, cigarettes, uncomplicated: Secondary | ICD-10-CM | POA: Diagnosis present

## 2017-07-08 DIAGNOSIS — Z955 Presence of coronary angioplasty implant and graft: Secondary | ICD-10-CM | POA: Diagnosis not present

## 2017-07-08 DIAGNOSIS — G8194 Hemiplegia, unspecified affecting left nondominant side: Secondary | ICD-10-CM | POA: Diagnosis present

## 2017-07-08 DIAGNOSIS — I6389 Other cerebral infarction: Secondary | ICD-10-CM

## 2017-07-08 DIAGNOSIS — K5900 Constipation, unspecified: Secondary | ICD-10-CM | POA: Diagnosis present

## 2017-07-08 DIAGNOSIS — Z8249 Family history of ischemic heart disease and other diseases of the circulatory system: Secondary | ICD-10-CM

## 2017-07-08 DIAGNOSIS — I6601 Occlusion and stenosis of right middle cerebral artery: Secondary | ICD-10-CM | POA: Diagnosis not present

## 2017-07-08 DIAGNOSIS — G8114 Spastic hemiplegia affecting left nondominant side: Secondary | ICD-10-CM | POA: Diagnosis not present

## 2017-07-08 DIAGNOSIS — D689 Coagulation defect, unspecified: Secondary | ICD-10-CM | POA: Diagnosis not present

## 2017-07-08 DIAGNOSIS — I214 Non-ST elevation (NSTEMI) myocardial infarction: Secondary | ICD-10-CM | POA: Diagnosis present

## 2017-07-08 HISTORY — PX: RADIOLOGY WITH ANESTHESIA: SHX6223

## 2017-07-08 HISTORY — PX: IR ANGIO INTRA EXTRACRAN SEL COM CAROTID INNOMINATE UNI R MOD SED: IMG5359

## 2017-07-08 LAB — COMPREHENSIVE METABOLIC PANEL
ALK PHOS: 84 U/L (ref 38–126)
ALT: 16 U/L (ref 14–54)
AST: 25 U/L (ref 15–41)
Albumin: 3.5 g/dL (ref 3.5–5.0)
Anion gap: 7 (ref 5–15)
BUN: 6 mg/dL (ref 6–20)
CALCIUM: 9.2 mg/dL (ref 8.9–10.3)
CHLORIDE: 106 mmol/L (ref 101–111)
CO2: 25 mmol/L (ref 22–32)
CREATININE: 0.76 mg/dL (ref 0.44–1.00)
GFR calc non Af Amer: >60 mL/min (ref >60)
Glucose, Bld: 193 mg/dL — ABNORMAL HIGH (ref 65–99)
Potassium: 3.4 mmol/L — ABNORMAL LOW (ref 3.5–5.1)
SODIUM: 138 mmol/L (ref 135–145)
Total Bilirubin: 0.7 mg/dL (ref 0.3–1.2)
Total Protein: 6.9 g/dL (ref 6.5–8.1)

## 2017-07-08 LAB — DIFFERENTIAL
BASOS ABS: 0 10*3/uL (ref 0.0–0.1)
BASOS PCT: 1 %
Eosinophils Absolute: 0.2 10*3/uL (ref 0.0–0.7)
Eosinophils Relative: 2 %
LYMPHS PCT: 52 %
Lymphs Abs: 4.3 10*3/uL — ABNORMAL HIGH (ref 0.7–4.0)
MONO ABS: 0.4 10*3/uL (ref 0.1–1.0)
MONOS PCT: 5 %
NEUTROS ABS: 3.3 10*3/uL (ref 1.7–7.7)
Neutrophils Relative %: 40 %

## 2017-07-08 LAB — CBC
HEMATOCRIT: 36.7 % (ref 36.0–46.0)
Hemoglobin: 11.7 g/dL — ABNORMAL LOW (ref 12.0–15.0)
MCH: 28.7 pg (ref 26.0–34.0)
MCHC: 31.9 g/dL (ref 30.0–36.0)
MCV: 90.2 fL (ref 78.0–100.0)
PLATELETS: 338 10*3/uL (ref 150–400)
RBC: 4.07 MIL/uL (ref 3.87–5.11)
RDW: 13.3 % (ref 11.5–15.5)
WBC: 8.2 10*3/uL (ref 4.0–10.5)

## 2017-07-08 LAB — ETHANOL: Alcohol, Ethyl (B): <10 mg/dL (ref <10)

## 2017-07-08 LAB — I-STAT CHEM 8, ED
BUN: 5 mg/dL — AB (ref 6–20)
CALCIUM ION: 1.13 mmol/L — AB (ref 1.15–1.40)
CHLORIDE: 104 mmol/L (ref 101–111)
Creatinine, Ser: 0.7 mg/dL (ref 0.44–1.00)
GLUCOSE: 186 mg/dL — AB (ref 65–99)
HCT: 39 % (ref 36.0–46.0)
Hemoglobin: 13.3 g/dL (ref 12.0–15.0)
POTASSIUM: 3.3 mmol/L — AB (ref 3.5–5.1)
Sodium: 140 mmol/L (ref 135–145)
TCO2: 22 mmol/L (ref 22–32)

## 2017-07-08 LAB — APTT: APTT: 28 s (ref 24–36)

## 2017-07-08 LAB — PROTIME-INR
INR: 0.94
Prothrombin Time: 12.5 seconds (ref 11.4–15.2)

## 2017-07-08 LAB — I-STAT BETA HCG BLOOD, ED (MC, WL, AP ONLY): I-stat hCG, quantitative: <5 m[IU]/mL (ref <5)

## 2017-07-08 LAB — I-STAT TROPONIN, ED: Troponin i, poc: 0 ng/mL (ref 0.00–0.08)

## 2017-07-08 SURGERY — IR WITH ANESTHESIA
Anesthesia: Monitor Anesthesia Care

## 2017-07-08 MED ORDER — SENNOSIDES-DOCUSATE SODIUM 8.6-50 MG PO TABS: 1.0000 | ORAL_TABLET | Freq: Every evening | ORAL | Status: DC | PRN

## 2017-07-08 MED ORDER — CARVEDILOL 12.5 MG PO TABS
25.0000 mg | ORAL_TABLET | Freq: Two times a day (BID) | ORAL | Status: DC
  Administered 2017-07-09 – 2017-07-13 (×9): 25 mg via ORAL
  Filled 2017-07-08 (×10): qty 2

## 2017-07-08 MED ORDER — RIVAROXABAN 20 MG PO TABS: 20.0000 mg | ORAL_TABLET | Freq: Every day | ORAL | Status: DC

## 2017-07-08 MED ORDER — CEFAZOLIN SODIUM-DEXTROSE 2-4 GM/100ML-% IV SOLN
INTRAVENOUS | Status: AC
  Filled 2017-07-08: qty 100

## 2017-07-08 MED ORDER — SODIUM CHLORIDE 0.9 % IV SOLN
INTRAVENOUS | Status: DC
  Administered 2017-07-08 (×2): via INTRAVENOUS

## 2017-07-08 MED ORDER — METRONIDAZOLE 500 MG PO TABS
500.0000 mg | ORAL_TABLET | Freq: Two times a day (BID) | ORAL | Status: DC
  Administered 2017-07-08 – 2017-07-10 (×4): 500 mg via ORAL
  Filled 2017-07-08 (×5): qty 1

## 2017-07-08 MED ORDER — LIDOCAINE HCL 1 % IJ SOLN
INTRAMUSCULAR | Status: AC
  Filled 2017-07-08: qty 20

## 2017-07-08 MED ORDER — ATORVASTATIN CALCIUM 40 MG PO TABS
40.0000 mg | ORAL_TABLET | Freq: Every day | ORAL | Status: DC
  Administered 2017-07-09 – 2017-07-13 (×5): 40 mg via ORAL
  Filled 2017-07-08 (×5): qty 1

## 2017-07-08 MED ORDER — FENTANYL CITRATE (PF) 100 MCG/2ML IJ SOLN
INTRAMUSCULAR | Status: DC | PRN
  Administered 2017-07-08: 50 ug via INTRAVENOUS

## 2017-07-08 MED ORDER — IOPAMIDOL (ISOVUE-370) INJECTION 76%
50.0000 mL | Freq: Once | INTRAVENOUS | Status: AC | PRN
  Administered 2017-07-08: 50 mL via INTRAVENOUS

## 2017-07-08 MED ORDER — TICAGRELOR 90 MG PO TABS
ORAL_TABLET | ORAL | Status: AC
  Filled 2017-07-08: qty 2

## 2017-07-08 MED ORDER — ASPIRIN 325 MG PO TABS
ORAL_TABLET | ORAL | Status: AC
  Filled 2017-07-08: qty 1

## 2017-07-08 MED ORDER — FENTANYL CITRATE (PF) 100 MCG/2ML IJ SOLN
INTRAMUSCULAR | Status: AC
Start: 2017-07-08 — End: 2017-07-08
  Filled 2017-07-08: qty 4

## 2017-07-08 MED ORDER — MIDAZOLAM HCL 2 MG/2ML IJ SOLN
INTRAMUSCULAR | Status: AC
  Filled 2017-07-08: qty 2

## 2017-07-08 MED ORDER — STROKE: EARLY STAGES OF RECOVERY BOOK
Freq: Once | Status: AC
  Administered 2017-07-09: 04:00:00
  Filled 2017-07-08 (×2): qty 1

## 2017-07-08 MED ORDER — SODIUM CHLORIDE 0.9 % IV SOLN
INTRAVENOUS | Status: DC
  Administered 2017-07-10: 1000 mL via INTRAVENOUS

## 2017-07-08 MED ORDER — HYDROCODONE-ACETAMINOPHEN 5-325 MG PO TABS
1.0000 | ORAL_TABLET | Freq: Four times a day (QID) | ORAL | Status: DC | PRN
  Administered 2017-07-09 – 2017-07-13 (×6): 1 via ORAL
  Filled 2017-07-08 (×6): qty 1

## 2017-07-08 MED ORDER — TIROFIBAN HCL IN NACL 5-0.9 MG/100ML-% IV SOLN
INTRAVENOUS | Status: AC
  Filled 2017-07-08: qty 100

## 2017-07-08 MED ORDER — NITROGLYCERIN 0.4 MG SL SUBL: 0.4000 mg | SUBLINGUAL_TABLET | SUBLINGUAL | Status: DC | PRN

## 2017-07-08 MED ORDER — RIVAROXABAN 20 MG PO TABS
20.0000 mg | ORAL_TABLET | Freq: Every day | ORAL | Status: DC
  Administered 2017-07-08 – 2017-07-13 (×6): 20 mg via ORAL
  Filled 2017-07-08 (×6): qty 1

## 2017-07-08 MED ORDER — ACETAMINOPHEN 325 MG PO TABS
650.0000 mg | ORAL_TABLET | ORAL | Status: DC | PRN
  Administered 2017-07-09 – 2017-07-10 (×4): 650 mg via ORAL
  Filled 2017-07-08 (×4): qty 2

## 2017-07-08 MED ORDER — ACETAMINOPHEN 160 MG/5ML PO SOLN: 650.0000 mg | ORAL | Status: DC | PRN

## 2017-07-08 MED ORDER — HYDROCORTISONE 2.5 % RE CREA: TOPICAL_CREAM | RECTAL | Status: DC | PRN

## 2017-07-08 MED ORDER — IOPAMIDOL (ISOVUE-300) INJECTION 61%
INTRAVENOUS | Status: AC
  Administered 2017-07-08: 30 mL
  Filled 2017-07-08: qty 150

## 2017-07-08 MED ORDER — EPTIFIBATIDE 20 MG/10ML IV SOLN
INTRAVENOUS | Status: AC
  Filled 2017-07-08: qty 10

## 2017-07-08 MED ORDER — IOPAMIDOL (ISOVUE-300) INJECTION 61%
INTRAVENOUS | Status: AC
  Filled 2017-07-08: qty 300

## 2017-07-08 MED ORDER — NITROGLYCERIN 1 MG/10 ML FOR IR/CATH LAB
INTRA_ARTERIAL | Status: AC
  Filled 2017-07-08: qty 10

## 2017-07-08 MED ORDER — MIDAZOLAM HCL 5 MG/5ML IJ SOLN
INTRAMUSCULAR | Status: DC | PRN
  Administered 2017-07-08: 1 mg via INTRAVENOUS

## 2017-07-08 MED ORDER — VITAMIN B-12 1000 MCG PO TABS
1000.0000 ug | ORAL_TABLET | Freq: Every day | ORAL | Status: DC
  Administered 2017-07-09 – 2017-07-13 (×5): 1000 ug via ORAL
  Filled 2017-07-08 (×5): qty 1

## 2017-07-08 MED ORDER — CLOPIDOGREL BISULFATE 300 MG PO TABS
ORAL_TABLET | ORAL | Status: AC
  Filled 2017-07-08: qty 1

## 2017-07-08 MED ORDER — DOXYCYCLINE HYCLATE 100 MG PO TABS
100.0000 mg | ORAL_TABLET | Freq: Two times a day (BID) | ORAL | Status: DC
  Administered 2017-07-08 – 2017-07-10 (×4): 100 mg via ORAL
  Filled 2017-07-08 (×5): qty 1

## 2017-07-08 MED ORDER — LIDOCAINE HCL 1 % IJ SOLN
INTRAMUSCULAR | Status: AC | PRN
  Administered 2017-07-08: 20 mL

## 2017-07-08 MED ORDER — ACETAMINOPHEN 650 MG RE SUPP: 650.0000 mg | RECTAL | Status: DC | PRN

## 2017-07-08 NOTE — Anesthesia Postprocedure Evaluation (Signed)
Anesthesia Post Note  Patient: Lindsey [name]  Procedure(s) Performed: IR WITH ANESTHESIA (N/A )     Patient location during evaluation: ICU Anesthesia Type: MAC Level of consciousness: awake Pain management: pain level controlled Vital Signs Assessment: post-procedure vital signs reviewed and stable Respiratory status: spontaneous breathing, nonlabored ventilation, respiratory function stable and patient connected to nasal cannula oxygen Cardiovascular status: stable and blood pressure returned to baseline Postop Assessment: no apparent nausea or vomiting Anesthetic complications: no    Last Vitals:  Vitals:   07/08/17 2200 07/08/17 2215  BP: 132/79 (!) 147/91  Pulse: 64 62  Resp: 17 13  Temp:    SpO2: 100% 98%    Last Pain:  Vitals:   07/08/17 2120  TempSrc: Oral  PainSc: 0-No pain                 Brittany Osier

## 2017-07-08 NOTE — Progress Notes (Signed)
Clarified with Dr Otelia Limes, SBP goal 110-180

## 2017-07-08 NOTE — Consult Note (Addendum)
Requesting Physician: Dr. Erma Heritage    Chief Complaint: Stroke   History obtained from: Patient and Chart    HPI:                                                                                                                                       Lindsey [name] is an 47 y.o. female with ? AT III deficiency, apical mural thrombus, prior history of stroke, HTN, HLD, MI who presents with sudden onset left facial droop and left arm weakness at 6.30 pm presents as a stroke alert.   Patient last normal at 6.30 pm having dinner until daughter noticed left facial droop. EMS was called and patient stated she had right side weakness however on exam noted to have left side weakness and left facial droop. BP was 147/91 mm HG recorded by EMS. CBG was 145.   Date last known well: 4.24.19 Time last known well: 18.30 tPA Given: no, on Xarelto- last dose yesterday NIHSS: 5 Baseline MRS 1    Past Medical History:  Diagnosis Date  . Anemia   . Antithrombin III deficiency (HCC)    ?pt not sure if true diagnosis  . Anxiety   . Apical mural thrombus   . Blood transfusion   . Cholecystitis 07/2016  . Coronary artery disease    a. apical LAD infarction '00. b. NSTEMI s/p BMS to prox LAD '09. c. Cath 01/2015: stable LAD stent, otherwise minimal nonobstructive CAD.  Marland Kitchen GERD (gastroesophageal reflux disease)   . Hyperlipidemia   . Hypertension   . Myocardial infarction (HCC)   . Noncompliance with medication regimen    a. h/o noncompliance with med regimen (previous running out of Coumadin).  . Stroke (HCC)    assoc with short term memory loss and right peripheral vision loss; age 37   . TIA (transient ischemic attack) 2010  . Tobacco abuse     Past Surgical History:  Procedure Laterality Date  . CARDIAC CATHETERIZATION    . CARDIAC CATHETERIZATION N/A 01/29/2015   Procedure: Left Heart Cath and Coronary Angiography;  Surgeon: Peter M [place], MD;  Location: [facility] INVASIVE CV LAB;  Service:  Cardiovascular;  Laterality: N/A;  . CHOLECYSTECTOMY N/A 08/14/2016   Procedure: LAPAROSCOPIC CHOLECYSTECTOMY;  Surgeon: Gaynelle Adu, MD;  Location: [facility] OR;  Service: General;  Laterality: N/A;  . COLONOSCOPY N/A 03/29/2014   Procedure: COLONOSCOPY;  Surgeon: Louis Meckel, MD;  Location: [facility] ENDOSCOPY;  Service: Endoscopy;  Laterality: N/A;  . CORONARY ANGIOPLASTY    . ESOPHAGOGASTRODUODENOSCOPY N/A 06/09/2012   Procedure: ESOPHAGOGASTRODUODENOSCOPY (EGD);  Surgeon: Theda Belfast, MD;  Location: [facility] ENDOSCOPY;  Service: Endoscopy;  Laterality: N/A;  . ESOPHAGOGASTRODUODENOSCOPY N/A 08/02/2013   Procedure: ESOPHAGOGASTRODUODENOSCOPY (EGD);  Surgeon: Meryl Dare, MD;  Location: [facility] ENDOSCOPY;  Service: Endoscopy;  Laterality: N/A;  . LEFT HEART CATHETERIZATION WITH CORONARY ANGIOGRAM N/A 12/24/2011   Procedure: LEFT HEART  CATHETERIZATION WITH CORONARY ANGIOGRAM;  Surgeon: Kathleene Hazel, MD;  Location: [facility] CATH LAB;  Service: Cardiovascular;  Laterality: N/A;  . TUBAL LIGATION      Family History  Problem Relation Age of Onset  . Heart disease Brother        arrhythmia; died  . Breast cancer Maternal Aunt    Social History:  reports that she has been smoking cigarettes.  She has a 0.20 pack-year smoking history. She has never used smokeless tobacco. She reports that she drinks alcohol. She reports that she has current or past drug history. Drug: Cocaine.  Allergies: No Known Allergies  Medications:                                                                                                                        I reviewed home medications, on Xarelto- took medication last night   ROS:                                                                                                                                     14 systems reviewed and negative except above   Examination:                                                                                                       General: Appears well-developed and well-nourished.  Psych: Affect appropriate to situation Eyes: No scleral injection HENT: No OP obstrucion Head: Normocephalic.  Cardiovascular: Normal rate and regular rhythm.  Respiratory: Effort normal and breath sounds normal to anterior ascultation GI: Soft.  No distension. There is no tenderness.  Skin: WDI   Neurological Examination Mental Status: Alert, oriented, thought content appropriate.  Speech fluent without evidence of aphasia. Able to follow 3 step commands without difficulty. Cranial Nerves: II: Visual fields grossly normal,  III,IV, VI: ptosis not present, extra-ocular motions intact bilaterally, pupils equal, round, reactive to light and accommodation V,VII: smile symmetric,left facial droop VIII: hearing normal bilaterally IX,X: uvula rises  symmetrically XI: bilateral shoulder shrug XII: midline tongue extension Motor: Right : Upper extremity   5/5    Left:     Upper extremity   4/5. drift  Lower extremity   5/5     Lower extremity   5/5 Tone and bulk:normal tone throughout; no atrophy noted Sensory: Pinprick and light touch intact throughout, bilaterally Deep Tendon Reflexes: 2+ and symmetric throughout Plantars: Right: downgoing   Left: downgoing Cerebellar: normal finger-to-nose, normal rapid alternating movements and normal heel-to-shin test Gait: did not assess     Lab Results: Basic Metabolic Panel: No results for input(s): NA, K, CL, CO2, GLUCOSE, BUN, CREATININE, CALCIUM, MG, PHOS in the last 168 hours.  CBC: No results for input(s): WBC, NEUTROABS, HGB, HCT, MCV, PLT in the last 168 hours.  Coagulation Studies: No results for input(s): LABPROT, INR in the last 72 hours.  Imaging: No results found.   ASSESSMENT AND PLAN  47 y.o. female with ? AT III deficiency, apical mural thrombus, prior history of stroke, HTN, HLD, MI who presents with sudden onset left facial droop and left arm weakness at  6.30 pm presents with sudden onset left facial droop.  CT head showed no bleed, not a candidate for tPA as patient on anticoagulation with Xarelto ( took last dose last night).  CTA showed R M2 occlusion with poor distal flow, Neuro IR called for emergency mechanical thrombectomy.  She underwent angiogram which shows recanalization of partial recanalization of M2 and patient was clinically improving, and therefore stent retreiver was not deployed.    Acute Ischemic Stroke - R MCA stroke 2/2 R M2 superior division occlusion with spontanous recanalization likely in the setting of being anticoagulated with Xarelto.   Plan # MRI of the brain without contrast #Transthoracic Echo  # Can restart AC if MRI Arlys John negative for large infarction #Start or continue Atorvastatin 80 mg/other high intensity statin # BP goal: permissive HTN upto 220/110 mmHG # HBAIC and Lipid profile # Telemetry monitoring # Frequent neuro checks # NPO until passes stroke swallow screen  Apical Thrombus Should restart AC once MRI brian to assess size of stroke.  May consider switching OAC agent due to Xarelto failure    DVT PPX: SCD Diet: NPO    This patient is neurologically critically ill due to acute stroke -underwent emergent IR procedure. She is at risk for significant risk of neurological worsening from cerebral edema,  death from brain herniation, heart failure, hemorrhagic conversion, infection, respiratory failure and seizure. This patient's care requires constant monitoring of vital signs, hemodynamics, respiratory and cardiac monitoring, review of multiple databases, neurological assessment, discussion with family, other specialists and medical decision making of high complexity.  I spent 65  minutes of neurocritical time in the care of this patient.      Sushanth Aroor [facility] Pager Number [phone]

## 2017-07-08 NOTE — Anesthesia Preprocedure Evaluation (Signed)
Anesthesia Evaluation  Patient identified by MRN, date of birth, ID band Patient confused    Reviewed: Allergy & PrecautionsPreop documentation limited or incomplete due to emergent nature of procedure.  History of Anesthesia Complications Negative for: history of anesthetic complications  Airway Mallampati: II  TM Distance: >3 FB Neck ROM: Full    Dental  (+) Teeth Intact   Pulmonary Current Smoker,    breath sounds clear to auscultation       Cardiovascular hypertension, Pt. on medications and Pt. on home beta blockers + CAD, + Past MI and + Peripheral Vascular Disease   Rhythm:Regular     Neuro/Psych PSYCHIATRIC DISORDERS Anxiety Depression TIA Neuromuscular disease CVA, Residual Symptoms    GI/Hepatic Neg liver ROS, hiatal hernia, GERD  Medicated,  Endo/Other    Renal/GU negative Renal ROS     Musculoskeletal   Abdominal   Peds  Hematology  (+) anemia ,   Anesthesia Other Findings On xarelto, hyperglycemia  Reproductive/Obstetrics                             Anesthesia Physical Anesthesia Plan  ASA: III and emergent  Anesthesia Plan: MAC   Post-op Pain Management:    Induction:   PONV Risk Score and Plan: 1 and Treatment may vary due to age or medical condition  Airway Management Planned: Nasal Cannula  Additional Equipment:   Intra-op Plan:   Post-operative Plan:   Informed Consent:   Only emergency history available  Plan Discussed with: CRNA  Anesthesia Plan Comments:         Anesthesia Quick Evaluation

## 2017-07-08 NOTE — ED Triage Notes (Signed)
Patient here by EMS for a CODE STROKE, [id number].  Left sided deficits, slurred speech, arm weakness.  Hx of strokes in the past.  On xrelato. Able to answer questions appropriately.

## 2017-07-08 NOTE — ED Notes (Signed)
Neurology at bedside.

## 2017-07-08 NOTE — Sedation Documentation (Signed)
Groin and pulses assessed with receiving nurse [place], RN. All questions answered.

## 2017-07-08 NOTE — ED Provider Notes (Signed)
[id number] [facility]NORTH NEURO/TRAUMA/SURGICAL ICU Provider Note   CSN: [id number] Arrival date & time: 07/08/17  1859   An emergency department physician performed an initial assessment on this suspected stroke patient at 1902.  History   Chief Complaint Chief Complaint  Patient presents with  . Code Stroke    HPI Lindsey [name] is a 47 y.o. female.  HPI   47 yo F with h/o antithrombin III deficiency, apical mural thrombus, CAD, h/o CVA here with left facial droop and weakness. Pt arrives as a stroke activation. LSN was 6:30 PM. Pt was having dinner when daughter noticed left facial droop. EMS called, and pt was found to have L sided weakness and L facial droop. CBG 145. Blood pressure wnl. Pt also had some reported slurred speech, improving per EMS.  Level 5 caveat invoked as remainder of history, ROS, and physical exam limited due to patient's acuity.   Past Medical History:  Diagnosis Date  . Anemia   . Antithrombin III deficiency (HCC)    ?pt not sure if true diagnosis  . Anxiety   . Apical mural thrombus   . Blood transfusion   . Cholecystitis 07/2016  . Coronary artery disease    a. apical LAD infarction '00. b. NSTEMI s/p BMS to prox LAD '09. c. Cath 01/2015: stable LAD stent, otherwise minimal nonobstructive CAD.  Marland Kitchen GERD (gastroesophageal reflux disease)   . Hyperlipidemia   . Hypertension   . Myocardial infarction (HCC)   . Noncompliance with medication regimen    a. h/o noncompliance with med regimen (previous running out of Coumadin).  . Stroke (HCC)    assoc with short term memory loss and right peripheral vision loss; age 58   . TIA (transient ischemic attack) 2010  . Tobacco abuse     Patient Active Problem List   Diagnosis Date Noted  . Stroke (cerebrum) (HCC) 07/08/2017  . Middle cerebral artery embolism, right 07/08/2017  . Vitamin B12 deficiency 08/15/2016  . Acute cholecystitis 08/14/2016  . GERD (gastroesophageal reflux disease) 04/24/2015  .  Hypertensive urgency 04/23/2015  . Chest pain, unspecified   . Right leg swelling 03/07/2015  . Preventative health care 02/21/2015  . Esophageal reflux   . NSTEMI (non-ST elevated myocardial infarction) (HCC) 01/28/2015  . Nausea without vomiting 12/22/2014  . Dysuria 12/21/2014  . Diarrhea 09/26/2014  . Difficulty swallowing pills 09/26/2014  . Hemorrhoids 06/16/2014  . Elevated AST (SGOT) 03/27/2014  . Lumbar herniated disc 03/27/2014  . TIA (transient ischemic attack) 02/19/2014  . History of ischemic left PCA stroke 02/19/2014  . Palpitations 01/27/2014  . Depression 08/15/2013  . Long term current use of anticoagulant therapy 08/10/2013  . Gastritis and duodenitis 08/03/2013  . Diverticulosis 08/03/2013  . Fatty liver 08/03/2013  . Esophagitis 08/03/2013  . Hiatal hernia 08/03/2013  . Apical mural thrombus 08/02/2013  . GI bleeding 06/04/2012  . Antithrombin III deficiency (HCC) 06/04/2012  . Paresthesias 06/29/2011  . Hyperlipemia 06/07/2009  . Essential hypertension 05/17/2009  . OBESITY-MORBID (>100') 03/13/2008  . CAD S/P  BMS to proximal LAD 2009-patent 01/29/15 06/28/2007    Past Surgical History:  Procedure Laterality Date  . CARDIAC CATHETERIZATION    . CARDIAC CATHETERIZATION N/A 01/29/2015   Procedure: Left Heart Cath and Coronary Angiography;  Surgeon: Peter M Swaziland, MD;  Location: [facility] INVASIVE CV LAB;  Service: Cardiovascular;  Laterality: N/A;  . CHOLECYSTECTOMY N/A 08/14/2016   Procedure: LAPAROSCOPIC CHOLECYSTECTOMY;  Surgeon: Gaynelle Adu, MD;  Location: [facility] OR;  Service:  General;  Laterality: N/A;  . COLONOSCOPY N/A 03/29/2014   Procedure: COLONOSCOPY;  Surgeon: Louis Meckel, MD;  Location: [facility] ENDOSCOPY;  Service: Endoscopy;  Laterality: N/A;  . CORONARY ANGIOPLASTY    . ESOPHAGOGASTRODUODENOSCOPY N/A 06/09/2012   Procedure: ESOPHAGOGASTRODUODENOSCOPY (EGD);  Surgeon: Theda Belfast, MD;  Location: [facility] ENDOSCOPY;  Service: Endoscopy;  Laterality: N/A;    . ESOPHAGOGASTRODUODENOSCOPY N/A 08/02/2013   Procedure: ESOPHAGOGASTRODUODENOSCOPY (EGD);  Surgeon: Meryl Dare, MD;  Location: [facility] ENDOSCOPY;  Service: Endoscopy;  Laterality: N/A;  . LEFT HEART CATHETERIZATION WITH CORONARY ANGIOGRAM N/A 12/24/2011   Procedure: LEFT HEART CATHETERIZATION WITH CORONARY ANGIOGRAM;  Surgeon: Kathleene Hazel, MD;  Location: [facility] CATH LAB;  Service: Cardiovascular;  Laterality: N/A;  . TUBAL LIGATION       OB History   None      Home Medications    Prior to Admission medications   Medication Sig Start Date End Date Taking? Authorizing Provider  acetaminophen (TYLENOL) 500 MG tablet Take 1,000 mg by mouth every 6 (six) hours as needed for headache (pain).    [provider]  atorvastatin (LIPITOR) 40 MG tablet Take 1 tablet (40 mg total) by mouth daily. 08/16/16   Rodolph Bong, MD  carvedilol (COREG) 25 MG tablet Take 1 tablet (25 mg total) by mouth 2 (two) times daily with a meal. 08/16/16   Rodolph Bong, MD  clopidogrel (PLAVIX) 75 MG tablet Take 1 tablet (75 mg total) by mouth daily. 08/16/16   Rodolph Bong, MD  docusate sodium (COLACE) 100 MG capsule Take 1 capsule (100 mg total) by mouth 2 (two) times daily. 08/16/16   Rodolph Bong, MD  doxycycline (VIBRAMYCIN) 100 MG capsule Take 1 capsule (100 mg total) by mouth 2 (two) times daily. 06/23/17   Tilden Fossa, MD  HYDROcodone-acetaminophen (NORCO/VICODIN) 5-325 MG tablet Take 1 tablet by mouth every 6 (six) hours as needed. 06/23/17   Tilden Fossa, MD  hydrocortisone (ANUSOL-HC) 2.5 % rectal cream Apply rectally 2 times daily prn 07/21/15   Cathren Laine, MD  methocarbamol (ROBAXIN) 500 MG tablet Take 1 tablet (500 mg total) by mouth 3 (three) times daily. 08/16/16   Rodolph Bong, MD  metroNIDAZOLE (FLAGYL) 500 MG tablet Take 1 tablet (500 mg total) by mouth 2 (two) times daily. 06/23/17   Tilden Fossa, MD  nitroGLYCERIN (NITROSTAT) 0.4 MG SL tablet Place 1 tablet  (0.4 mg total) under the tongue every 5 (five) minutes as needed for chest pain (up to 3 doses). 08/16/16   Rodolph Bong, MD  ondansetron (ZOFRAN ODT) 8 MG disintegrating tablet Take 1 tablet (8 mg total) by mouth every 8 (eight) hours as needed for nausea or vomiting. 08/16/16   Rodolph Bong, MD  oxyCODONE (OXY IR/ROXICODONE) 5 MG immediate release tablet Take 1-2 tablets (5-10 mg total) by mouth every 4 (four) hours as needed for moderate pain. 08/16/16   Rodolph Bong, MD  pantoprazole (PROTONIX) 40 MG tablet Take 1 tablet (40 mg total) by mouth daily. 08/16/16   Rodolph Bong, MD  rivaroxaban (XARELTO) 20 MG TABS tablet Take 1 tablet (20 mg total) by mouth daily with supper. 08/16/16   Rodolph Bong, MD  vitamin B-12 (CYANOCOBALAMIN) 1000 MCG tablet Take 1 tablet (1,000 mcg total) by mouth daily. 08/16/16   Rodolph Bong, MD    Family History Family History  Problem Relation Age of Onset  . Heart disease Brother  arrhythmia; died  . Breast cancer Maternal Aunt     Social History Social History   Tobacco Use  . Smoking status: Current Every Day Smoker    Packs/day: 0.20    Years: 1.00    Pack years: 0.20    Types: Cigarettes  . Smokeless tobacco: Never Used  . Tobacco comment: 2018  " i STILL SMOKE 3  CIGARETTES A DAY "  Substance Use Topics  . Alcohol use: Yes    Alcohol/week: 0.0 oz    Comment: Social drinker  . Drug use: Yes    Types: Cocaine    Comment: hx of use x 1     Allergies   Patient has no known allergies.   Review of Systems Review of Systems  Constitutional: Negative for chills, fatigue and fever.  HENT: Negative for congestion and rhinorrhea.   Eyes: Negative for visual disturbance.  Respiratory: Negative for cough, shortness of breath and wheezing.   Cardiovascular: Negative for chest pain and leg swelling.  Gastrointestinal: Negative for abdominal pain, diarrhea, nausea and vomiting.  Genitourinary: Negative for dysuria  and flank pain.  Musculoskeletal: Negative for neck pain and neck stiffness.  Skin: Negative for rash and wound.  Allergic/Immunologic: Negative for immunocompromised state.  Neurological: Positive for facial asymmetry, speech difficulty and weakness. Negative for syncope and headaches.  All other systems reviewed and are negative.    Physical Exam Updated Vital Signs BP (!) 145/95   Pulse 60   Temp 98.5 F (36.9 C)   Resp 17   Ht 5\' 6"  (1.676 m)   Wt 78.4 kg (172 lb 13.5 oz)   SpO2 98%   BMI 27.90 kg/m   Physical Exam  Constitutional: She appears well-developed and well-nourished. No distress.  HENT:  Head: Normocephalic and atraumatic.  Eyes: Conjunctivae are normal.  Neck: Neck supple.  Cardiovascular: Normal rate, regular rhythm and normal heart sounds.  Pulmonary/Chest: Effort normal. No respiratory distress. She has no wheezes.  Abdominal: She exhibits no distension.  Musculoskeletal: She exhibits no edema.  Neurological: She is alert. She exhibits normal muscle tone.  Left facial droop. Subjective weakness LUE and LLE with LUE drift. Speech appears normal. EOMI. Tongue midline, palate elevated symmetrically. Gait deferred.  Skin: Skin is warm. Capillary refill takes less than 2 seconds. No rash noted.  Nursing note and vitals reviewed.    ED Treatments / Results  Labs (all labs ordered are listed, but only abnormal results are displayed) Labs Reviewed  CBC - Abnormal; Notable for the following components:      Result Value   Hemoglobin 11.7 (*)    All other components within normal limits  DIFFERENTIAL - Abnormal; Notable for the following components:   Lymphs Abs 4.3 (*)    All other components within normal limits  COMPREHENSIVE METABOLIC PANEL - Abnormal; Notable for the following components:   Potassium 3.4 (*)    Glucose, Bld 193 (*)    All other components within normal limits  I-STAT CHEM 8, ED - Abnormal; Notable for the following components:    Potassium 3.3 (*)    BUN 5 (*)    Glucose, Bld 186 (*)    Calcium, Ion 1.13 (*)    All other components within normal limits  MRSA PCR SCREENING  ETHANOL  PROTIME-INR  APTT  RAPID URINE DRUG SCREEN, HOSP PERFORMED  URINALYSIS, ROUTINE W REFLEX MICROSCOPIC  HEMOGLOBIN A1C  LIPID PANEL  CBC  BASIC METABOLIC PANEL  I-STAT TROPONIN, ED  I-STAT  BETA HCG BLOOD, ED ([facility], [facility], [facility] ONLY)    EKG None  Radiology Ct Angio Head W Or Wo Contrast  Result Date: 07/08/2017 CLINICAL DATA:  47 y/o  F; stroke patient with follow-up scan. EXAM: CT ANGIOGRAPHY HEAD AND NECK TECHNIQUE: Multidetector CT imaging of the head and neck was performed using the standard protocol during bolus administration of intravenous contrast. Multiplanar CT image reconstructions and MIPs were obtained to evaluate the vascular anatomy. Carotid stenosis measurements (when applicable) are obtained utilizing NASCET criteria, using the distal internal carotid diameter as the denominator. CONTRAST:  50mL ISOVUE-370 IOPAMIDOL (ISOVUE-370) INJECTION 76% COMPARISON:  07/08/2017 CT head. 02/19/2014 CT angiogram head and neck. FINDINGS: CTA NECK FINDINGS Aortic arch: Standard branching. Imaged portion shows no evidence of aneurysm or dissection. No significant stenosis of the major arch vessel origins. Right carotid system: No evidence of dissection, stenosis (50% or greater) or occlusion. Left carotid system: No evidence of dissection, stenosis (50% or greater) or occlusion. Vertebral arteries: Codominant. No evidence of dissection, stenosis (50% or greater) or occlusion. Skeleton: Negative. Other neck: Negative. Upper chest: Negative. Review of the MIP images confirms the above findings CTA HEAD FINDINGS Anterior circulation: Right M2 superior division proximal occlusion. Poor downstream collateralization. Otherwise no significant stenosis, proximal occlusion, aneurysm, or vascular malformation. Posterior circulation: No significant stenosis,  proximal occlusion, aneurysm, or vascular malformation. Venous sinuses: As permitted by contrast timing, patent. Anatomic variants: Small anterior communicating artery. No posterior communicating artery identified, likely hypoplastic or absent. Review of the MIP images confirms the above findings IMPRESSION: 1. Patent carotid and vertebral arteries. No dissection, aneurysm, or hemodynamically significant stenosis utilizing NASCET criteria. 2. Right M2 superior division proximal occlusion. 3. Otherwise patent anterior and posterior intracranial circulation. No additional large vessel occlusion, aneurysm, or significant stenosis. Electronically Signed   By: Mitzi Hansen M.D.   On: 07/08/2017 19:58   Dg Chest 1 View  Result Date: 07/08/2017 CLINICAL DATA:  Code stroke. EXAM: CHEST  1 VIEW COMPARISON:  Chest radiograph Aug 07, 2016 FINDINGS: Cardiomediastinal silhouette is normal. No pleural effusions or focal consolidations. Trachea projects midline and there is no pneumothorax. Soft tissue planes and included osseous structures are non-suspicious. IMPRESSION: Negative. Electronically Signed   By: Awilda Metro M.D.   On: 07/08/2017 23:04   Ct Head Wo Contrast  Result Date: 07/08/2017 CLINICAL DATA:  47 y/o  F; right M2 occlusion for follow-up. EXAM: CT HEAD WITHOUT CONTRAST TECHNIQUE: Contiguous axial images were obtained from the base of the skull through the vertex without intravenous contrast. COMPARISON:  07/08/2017 CT and CTA of the head. FINDINGS: Brain: Developing loss of gray-Bracy differentiation within the right anterolateral frontal lobe, frontal operculum, and insula. No acute hemorrhage. No significant mass effect. No new area of stroke, hemorrhage, or mass effect identified in the brain. No extra-axial collection, hydrocephalus, or effacement of basilar cisterns. Vascular: Calcific atherosclerosis of carotid siphons. Skull: Normal. Negative for fracture or focal lesion.  Sinuses/Orbits: Mild paranasal sinus mucosal thickening. Trace opacification within the left mastoid tip. Orbits are unremarkable. Other: None. IMPRESSION: Developing loss of gray-Bacigalupo differentiation in the right anterolateral frontal lobe, frontal operculum, and insula. ASPECTS is 7 at this time. No mass effect or hemorrhage. Electronically Signed   By: Mitzi Hansen M.D.   On: 07/08/2017 23:03   Ct Angio Neck W Or Wo Contrast  Result Date: 07/08/2017 CLINICAL DATA:  47 y/o  F; stroke patient with follow-up scan. EXAM: CT ANGIOGRAPHY HEAD AND NECK TECHNIQUE: Multidetector CT  imaging of the head and neck was performed using the standard protocol during bolus administration of intravenous contrast. Multiplanar CT image reconstructions and MIPs were obtained to evaluate the vascular anatomy. Carotid stenosis measurements (when applicable) are obtained utilizing NASCET criteria, using the distal internal carotid diameter as the denominator. CONTRAST:  45mL ISOVUE-370 IOPAMIDOL (ISOVUE-370) INJECTION 76% COMPARISON:  07/08/2017 CT head. 02/19/2014 CT angiogram head and neck. FINDINGS: CTA NECK FINDINGS Aortic arch: Standard branching. Imaged portion shows no evidence of aneurysm or dissection. No significant stenosis of the major arch vessel origins. Right carotid system: No evidence of dissection, stenosis (50% or greater) or occlusion. Left carotid system: No evidence of dissection, stenosis (50% or greater) or occlusion. Vertebral arteries: Codominant. No evidence of dissection, stenosis (50% or greater) or occlusion. Skeleton: Negative. Other neck: Negative. Upper chest: Negative. Review of the MIP images confirms the above findings CTA HEAD FINDINGS Anterior circulation: Right M2 superior division proximal occlusion. Poor downstream collateralization. Otherwise no significant stenosis, proximal occlusion, aneurysm, or vascular malformation. Posterior circulation: No significant stenosis, proximal  occlusion, aneurysm, or vascular malformation. Venous sinuses: As permitted by contrast timing, patent. Anatomic variants: Small anterior communicating artery. No posterior communicating artery identified, likely hypoplastic or absent. Review of the MIP images confirms the above findings IMPRESSION: 1. Patent carotid and vertebral arteries. No dissection, aneurysm, or hemodynamically significant stenosis utilizing NASCET criteria. 2. Right M2 superior division proximal occlusion. 3. Otherwise patent anterior and posterior intracranial circulation. No additional large vessel occlusion, aneurysm, or significant stenosis. Electronically Signed   By: Mitzi Hansen M.D.   On: 07/08/2017 19:58   Ct Head Code Stroke Wo Contrast  Result Date: 07/08/2017 CLINICAL DATA:  Code stroke. Initial evaluation for acute left arm weakness. EXAM: CT HEAD WITHOUT CONTRAST TECHNIQUE: Contiguous axial images were obtained from the base of the skull through the vertex without intravenous contrast. COMPARISON:  Prior CT from 02/19/2016. FINDINGS: Brain: Age-related cerebral atrophy. Small remote left PCA territory infarct. No acute intracranial hemorrhage. No acute large vessel territory infarct. No mass lesion, midline shift or mass effect. No hydrocephalus. No extra-axial fluid collection. Vascular: No hyperdense vessel. Skull: Scalp soft tissues and calvarium within normal limits. Sinuses/Orbits: Globes and orbital soft tissues normal. Scattered mucosal thickening within the ethmoidal air cells, maxillary sinuses, and sphenoid sinuses. No mastoid effusion. Other: 7 ASPECTS (Alberta Stroke Program Early CT Score) - Ganglionic level infarction (caudate, lentiform nuclei, internal capsule, insula, M1-M3 cortex): 7 - Supraganglionic infarction (M4-M6 cortex): 3 Total score (0-10 with 10 being normal): 10 IMPRESSION: 1. No acute intracranial infarct or other process identified. 2. ASPECTS is 10. 3. Remote left PCA territory  infarct. These results were communicated to Dr. Laurence Slate at 7:35 pmon 4/24/2019by text page via the [facility] messaging system. Electronically Signed   By: Rise Mu M.D.   On: 07/08/2017 19:40    Procedures .Critical Care Performed by: Shaune Pollack, MD Authorized by: Shaune Pollack, MD   Critical care provider statement:    Critical care time (minutes):  35   Critical care time was exclusive of:  Separately billable procedures and treating other patients and teaching time   Critical care was necessary to treat or prevent imminent or life-threatening deterioration of the following conditions:  CNS failure or compromise   Critical care was time spent personally by me on the following activities:  Development of treatment plan with patient or surrogate, discussions with consultants, evaluation of patient's response to treatment, examination of patient, obtaining history from patient or  surrogate, ordering and performing treatments and interventions, ordering and review of laboratory studies, ordering and review of radiographic studies, pulse oximetry, re-evaluation of patient's condition and review of old charts   I assumed direction of critical care for this patient from another provider in my specialty: no     (including critical care time)  Medications Ordered in ED Medications  lidocaine (XYLOCAINE) 1 % (with pres) injection (has no administration in time range)  nitroGLYCERIN 100 MCG/ML intra-arterial injection (has no administration in time range)   stroke: mapping our early stages of recovery book (has no administration in time range)  0.9 %  sodium chloride infusion ( Intravenous Rate/Dose Change 07/08/17 2300)  acetaminophen (TYLENOL) tablet 650 mg (has no administration in time range)    Or  acetaminophen (TYLENOL) solution 650 mg (has no administration in time range)    Or  acetaminophen (TYLENOL) suppository 650 mg (has no administration in time range)  senna-docusate  (Senokot-S) tablet 1 tablet (has no administration in time range)  atorvastatin (LIPITOR) tablet 40 mg (has no administration in time range)  carvedilol (COREG) tablet 25 mg (has no administration in time range)  doxycycline (VIBRA-TABS) tablet 100 mg (100 mg Oral Given 07/08/17 2355)  HYDROcodone-acetaminophen (NORCO/VICODIN) 5-325 MG per tablet 1 tablet (has no administration in time range)  hydrocortisone (ANUSOL-HC) 2.5 % rectal cream (has no administration in time range)  metroNIDAZOLE (FLAGYL) tablet 500 mg (500 mg Oral Given 07/08/17 2355)  nitroGLYCERIN (NITROSTAT) SL tablet 0.4 mg (has no administration in time range)  vitamin B-12 (CYANOCOBALAMIN) tablet 1,000 mcg (has no administration in time range)  0.9 %  sodium chloride infusion ( Intravenous Duplicate 07/08/17 2151)  rivaroxaban (XARELTO) tablet 20 mg (20 mg Oral Given 07/08/17 2355)  iopamidol (ISOVUE-370) 76 % injection 50 mL (50 mLs Intravenous Contrast Given 07/08/17 1924)  fentaNYL (SUBLIMAZE) 100 MCG/2ML injection (  Override pull for Anesthesia 07/08/17 2030)  iopamidol (ISOVUE-300) 61 % injection (30 mLs  Contrast Given 07/08/17 2102)  midazolam (VERSED) 2 MG/2ML injection (  Override pull for Anesthesia 07/08/17 2037)  lidocaine (XYLOCAINE) 1 % (with pres) injection (20 mLs  Given 07/08/17 2102)     Initial Impression / Assessment and Plan / ED Course  I have reviewed the triage vital signs and the nursing notes.  Pertinent labs & imaging results that were available during my care of the patient were reviewed by me and considered in my medical decision making (see chart for details).  Clinical Course as of Jul 08 2356  Wed Jul 08, 2017  2826 47 year old female with history of previous stroke and ACS secondary to clotting disorder here with left-sided facial droop and left-sided weakness.  Patient also with dysarthria which is improving.  Code stroke activated and taken to CT scanner.  Dr. Laurence Slate of neurology is at bedside.    [CI]    Clinical Course User Index [CI] Shaune Pollack, MD    Pt with concern for mCA occusion. Pt taken to IR with Neurosurgery immediately. CT head NAICA.   Final Clinical Impressions(s) / ED Diagnoses   Final diagnoses:  Stroke [facility])  Stroke (cerebrum) [facility])  Cerebrovascular accident (CVA) due to thrombosis of right carotid artery [facility])    ED Discharge Orders    None       Shaune Pollack, MD 07/09/17 0002

## 2017-07-08 NOTE — Progress Notes (Signed)
Discussed patient with Dr. Corliss Skains and Dr. Laurence Slate. Patient spontaneously recanalized during the angiogram, with no occlusion seen. She is on Xarelto qhs and has an apical thrombus, putting her at very high risk for recurrent embolic stroke if off this medication. Last took Xarelto yesterday evening. Will obtain STAT CT head and if negative for hemorrhage post-procedure, will restart Xarelto.   Electronically signed: Dr. Caryl Pina

## 2017-07-08 NOTE — Progress Notes (Signed)
Patient ID: Lindsey [name], female   DOB: 08-26-1970, 47 y.o.   MRN: [id number] INR.  3 yrvold F L H LSW 630 pm.Onset of lt sided weakness ,slurred speech . Ct Brain No ICH.ASPECTS 10  CT Occluded RT MCA superior division ?early recanalization.  Option of  Potential endovascular revascularization  discussed wit patient and mother.preceded by diagnostic arteriogram.If large vessel confirmed endovascular treatment under [place] would be considered. Risks ,benefits alternatives were discussed in detail.Risks ofICH of 10 % ,worsening neuro deficit,vent dependency,death inability to revascularize and vessel injury were all reviewed.Informed consent obtained for diagnostic arteriogram with MAC sedation obtained from patient.Marland Kitchen S.Jamara Vary MD

## 2017-07-08 NOTE — ED Notes (Signed)
Patient states that when event was starting at dinner she was drooling and could not speak.

## 2017-07-08 NOTE — ED Notes (Signed)
ALL BELONGINGS GIVEN TO FAMILY

## 2017-07-08 NOTE — Transfer of Care (Signed)
Immediate Anesthesia Transfer of Care Note  Patient: Lindsey [name]  Procedure(s) Performed: IR WITH ANESTHESIA (N/A )  Patient Location: NICU  Anesthesia Type:MAC  Level of Consciousness: awake  Airway & Oxygen Therapy: Patient Spontanous Breathing and Patient connected to nasal cannula oxygen  Post-op Assessment: Report given to RN and Post -op Vital signs reviewed and stable  Post vital signs: Reviewed and stable  Last Vitals:  Vitals Value Taken Time  BP 128/91 07/08/2017  9:31 PM  Temp    Pulse 60 07/08/2017  9:36 PM  Resp 26 07/08/2017  9:36 PM  SpO2 100 % 07/08/2017  9:36 PM  Vitals shown include unvalidated device data.  Last Pain:  Vitals:   07/08/17 1934  TempSrc: Oral  PainSc:          Complications: No apparent anesthesia complications

## 2017-07-08 NOTE — Procedures (Signed)
S/p rt common carotid arteriogra. Findings.Marland Kitchen 1.Recanalizing  Rt MCA non dominant superior division

## 2017-07-09 ENCOUNTER — Ambulatory Visit (HOSPITAL_COMMUNITY): Payer: Medicare Other

## 2017-07-09 ENCOUNTER — Encounter (HOSPITAL_COMMUNITY): Payer: Self-pay | Admitting: Radiology

## 2017-07-09 ENCOUNTER — Inpatient Hospital Stay (HOSPITAL_COMMUNITY): Payer: Medicare Other

## 2017-07-09 DIAGNOSIS — I25119 Atherosclerotic heart disease of native coronary artery with unspecified angina pectoris: Secondary | ICD-10-CM

## 2017-07-09 DIAGNOSIS — E785 Hyperlipidemia, unspecified: Secondary | ICD-10-CM

## 2017-07-09 LAB — CBC
HCT: 32.4 % — ABNORMAL LOW (ref 36.0–46.0)
Hemoglobin: 10.5 g/dL — ABNORMAL LOW (ref 12.0–15.0)
MCH: 28.9 pg (ref 26.0–34.0)
MCHC: 32.4 g/dL (ref 30.0–36.0)
MCV: 89.3 fL (ref 78.0–100.0)
Platelets: 296 10*3/uL (ref 150–400)
RBC: 3.63 MIL/uL — ABNORMAL LOW (ref 3.87–5.11)
RDW: 13.2 % (ref 11.5–15.5)
WBC: 5.5 10*3/uL (ref 4.0–10.5)

## 2017-07-09 LAB — URINALYSIS, ROUTINE W REFLEX MICROSCOPIC
Bilirubin Urine: NEGATIVE
GLUCOSE, UA: NEGATIVE mg/dL
Hgb urine dipstick: NEGATIVE
Ketones, ur: NEGATIVE mg/dL
LEUKOCYTES UA: NEGATIVE
NITRITE: NEGATIVE
PROTEIN: NEGATIVE mg/dL
Specific Gravity, Urine: 1.016 (ref 1.005–1.030)
pH: 7 (ref 5.0–8.0)

## 2017-07-09 LAB — RAPID URINE DRUG SCREEN, HOSP PERFORMED
Amphetamines: NOT DETECTED
BARBITURATES: NOT DETECTED
Benzodiazepines: POSITIVE — AB
COCAINE: NOT DETECTED
Opiates: NOT DETECTED
Tetrahydrocannabinol: NOT DETECTED

## 2017-07-09 LAB — BASIC METABOLIC PANEL
Anion gap: 8 (ref 5–15)
BUN: 5 mg/dL — ABNORMAL LOW (ref 6–20)
CALCIUM: 8.5 mg/dL — AB (ref 8.9–10.3)
CO2: 22 mmol/L (ref 22–32)
CREATININE: 0.61 mg/dL (ref 0.44–1.00)
Chloride: 108 mmol/L (ref 101–111)
GFR calc non Af Amer: >60 mL/min (ref >60)
Glucose, Bld: 100 mg/dL — ABNORMAL HIGH (ref 65–99)
Potassium: 3.8 mmol/L (ref 3.5–5.1)
SODIUM: 138 mmol/L (ref 135–145)

## 2017-07-09 LAB — HEMOGLOBIN A1C
HEMOGLOBIN A1C: 4.9 % (ref 4.8–5.6)
MEAN PLASMA GLUCOSE: 93.93 mg/dL

## 2017-07-09 LAB — LIPID PANEL
CHOLESTEROL: 147 mg/dL (ref 0–200)
HDL: 37 mg/dL — ABNORMAL LOW (ref >40)
LDL Cholesterol: 85 mg/dL (ref 0–99)
Total CHOL/HDL Ratio: 4 RATIO
Triglycerides: 125 mg/dL (ref <150)
VLDL: 25 mg/dL (ref 0–40)

## 2017-07-09 LAB — MRSA PCR SCREENING: MRSA BY PCR: NEGATIVE

## 2017-07-09 LAB — ECHOCARDIOGRAM COMPLETE
HEIGHTINCHES: 66 in
WEIGHTICAEL: 2765.45 [oz_av]

## 2017-07-09 LAB — GLUCOSE, CAPILLARY: Glucose-Capillary: 183 mg/dL — ABNORMAL HIGH (ref 65–99)

## 2017-07-09 MED ORDER — PANTOPRAZOLE SODIUM 40 MG PO TBEC
40.0000 mg | DELAYED_RELEASE_TABLET | Freq: Every day | ORAL | Status: DC
  Administered 2017-07-09 – 2017-07-13 (×5): 40 mg via ORAL
  Filled 2017-07-09 (×5): qty 1

## 2017-07-09 MED ORDER — CLOPIDOGREL BISULFATE 75 MG PO TABS
75.0000 mg | ORAL_TABLET | Freq: Every day | ORAL | Status: DC
  Administered 2017-07-09 – 2017-07-13 (×5): 75 mg via ORAL
  Filled 2017-07-09 (×5): qty 1

## 2017-07-09 MED ORDER — LORAZEPAM 2 MG/ML IJ SOLN
2.0000 mg | Freq: Once | INTRAMUSCULAR | Status: AC
  Administered 2017-07-09: 2 mg via INTRAVENOUS
  Filled 2017-07-09: qty 1

## 2017-07-09 MED ORDER — POTASSIUM CHLORIDE CRYS ER 20 MEQ PO TBCR
40.0000 meq | EXTENDED_RELEASE_TABLET | Freq: Once | ORAL | Status: DC
  Filled 2017-07-09: qty 2

## 2017-07-09 NOTE — Evaluation (Addendum)
Physical Therapy Evaluation Patient Details Name: Lindsey [name] MRN: [id number] DOB: 1970/08/13 Today's Date: 07/09/2017   History of Present Illness  [name]. PMHx CVA at age 47, TIA 66, MI 2009, antithrombin III deficiency, apical mural thrombus, CAD, hypertension, hyperlipidemia.  Clinical Impression  Pt pleasant and wanting to get OOB. Pt with baseline peripheral vision loss with left facial droop, decreased attention, awareness and perseveration throughout tasks. Pt running into obstacles on left despite cues and needs 24hr  supervision for safety and functional mobility who will benefit from acute therapy to maximize gait, safety, balance and function to decrease burden of care. Pt has 2 daughters that live with her and a mother who works and she reports mother can take off work to assist but she was not present to confirm. At sink pt washing hands then drying and grabbing towel 5x without ceasing activity with no awareness of repetition/ perseveration. Pt also noted to cough after drinking end of session.      Follow Up Recommendations Supervision/Assistance - 24 hour;Home Health PT    Equipment Recommendations  None recommended by PT    Recommendations for Other Services Speech consult     Precautions / Restrictions Precautions Precautions: Fall Precaution Comments: left inattention      Mobility  Bed Mobility Overal bed mobility: Needs Assistance Bed Mobility: Supine to Sit     Supine to sit: Supervision     General bed mobility comments: supervision for lines and safety  Transfers Overall transfer level: Needs assistance   Transfers: Sit to/from Stand Sit to Stand: Min guard         General transfer comment: guarding for lines and safety, assist to move linens with toileting unaware of need to fully move gown  Ambulation/Gait Ambulation/Gait assistance: Min guard Ambulation Distance (Feet):  150 Feet Assistive device: None Gait Pattern/deviations: Step-through pattern;Decreased stride length   Gait velocity interpretation: <1.8 ft/sec, indicate of risk for recurrent falls General Gait Details: left inattention, running into objects on left despite cues, able to read room number and locate direction back to room. with questions during gait will pause and needs cues to initiate gait again  Stairs            Wheelchair Mobility    Modified Rankin (Stroke Patients Only) Modified Rankin (Stroke Patients Only) Pre-Morbid Rankin Score: No significant disability Modified Rankin: Moderately severe disability     Balance Overall balance assessment: Needs assistance   Sitting balance-Leahy Scale: Good       Standing balance-Leahy Scale: Good                               Pertinent Vitals/Pain Pain Assessment: 0-10 Pain Score: 4  Pain Location: head Pain Descriptors / Indicators: Aching Pain Intervention(s): Limited activity within patient's tolerance;Repositioned    Home Living Family/patient expects to be discharged to:: Private residence Living Arrangements: Children Available Help at Discharge: Family;Available PRN/intermittently Type of Home: House Home Access: Stairs to enter   Entrance Stairs-Number of Steps: 2 Home Layout: Two level;Bed/bath upstairs Home Equipment: None      Prior Function Level of Independence: Independent         Comments: works at [employer], lives with 13 and 106 yo kids     Hand Dominance   Dominant Hand: Right    Extremity/Trunk Assessment   Upper Extremity Assessment  Upper Extremity Assessment: Defer to OT evaluation    Lower Extremity Assessment Lower Extremity Assessment: Overall WFL for tasks assessed    Cervical / Trunk Assessment Cervical / Trunk Assessment: Normal  Communication   Communication: No difficulties  Cognition Arousal/Alertness: Awake/alert Behavior During Therapy:  Flat affect Overall Cognitive Status: Impaired/Different from baseline Area of Impairment: Memory;Attention;Awareness;Following commands;Safety/judgement                   Current Attention Level: Focused Memory: Decreased short-term memory Following Commands: Follows one step commands with increased time Safety/Judgement: Decreased awareness of safety;Decreased awareness of deficits     General Comments: pt with left inattention, perseveration on tasks with inability to recognize need to end task, with interruption during activity pauses and needs cues to reengage and continue mobility      General Comments      Exercises     Assessment/Plan    PT Assessment Patient needs continued PT services  PT Problem List Decreased mobility;Decreased safety awareness;Decreased activity tolerance;Decreased cognition;Decreased balance       PT Treatment Interventions Gait training;Patient/family education;Stair training;Balance training;Functional mobility training;Neuromuscular re-education;Cognitive remediation    PT Goals (Current goals can be found in the Care Plan section)  Acute Rehab PT Goals Patient Stated Goal: return to home and work PT Goal Formulation: With patient Time For Goal Achievement: 07/23/17 Potential to Achieve Goals: Good    Frequency Min 3X/week   Barriers to discharge Decreased caregiver support pt's daughters are in school, mom works but pt states mom can stay with her    Co-evaluation PT/OT/SLP Co-Evaluation/Treatment: Yes Reason for Co-Treatment: Complexity of the patient's impairments (multi-system involvement) PT goals addressed during session: Mobility/safety with mobility;Balance         AM-PAC PT "6 Clicks" Daily Activity  Outcome Measure Difficulty turning over in bed (including adjusting bedclothes, sheets and blankets)?: None Difficulty moving from lying on back to sitting on the side of the bed? : None Difficulty sitting down on and  standing up from a chair with arms (e.g., wheelchair, bedside commode, etc,.)?: A Little Help needed moving to and from a bed to chair (including a wheelchair)?: A Little Help needed walking in hospital room?: A Little Help needed climbing 3-5 steps with a railing? : A Little 6 Click Score: 20    End of Session Equipment Utilized During Treatment: Gait belt Activity Tolerance: Patient tolerated treatment well Patient left: in chair;with call bell/phone within reach;with chair alarm set;with nursing/sitter in room Nurse Communication: Mobility status;Precautions PT Visit Diagnosis: Other abnormalities of gait and mobility (R26.89);Other symptoms and signs involving the nervous system (R29.898)    Time: 1610-9604 PT Time Calculation (min) (ACUTE ONLY): 39 min   Charges:   PT Evaluation $PT Eval Moderate Complexity: 1 Mod     PT G Codes:        Delaney Meigs, PT [phone]   Loi Rennaker B Salam Micucci 07/09/2017, 11:19 AM

## 2017-07-09 NOTE — Progress Notes (Signed)
STROKE TEAM PROGRESS NOTE   SUBJECTIVE (INTERVAL HISTORY) Her RN is at the bedside. She is left handed, and was sitting in bed for breakfast. She was feeding herself. Her left UE weakness is much improved. She still has left facial droop. More right gaze preference. MRI pending. Back on Xarelto. She admitted that she missed several dose of Xarelto due to not refill the meds. She at least missed Sunday and Monday night doses. But she took the Tuesday night dose. She received yesterday night here in the ICU.   OBJECTIVE Temp:  [98.2 F (36.8 C)-98.9 F (37.2 C)] 98.3 F (36.8 C) (04/25 0400) Pulse Rate:  [53-80] 58 (04/25 0700) Cardiac Rhythm: Sinus bradycardia;Normal sinus rhythm (04/25 0400) Resp:  [13-28] 16 (04/25 0700) BP: (121-159)/(79-104) 130/87 (04/25 0700) SpO2:  [97 %-100 %] 98 % (04/25 0700) Weight:  [172 lb 13.5 oz (78.4 kg)-175 lb 4.3 oz (79.5 kg)] 172 lb 13.5 oz (78.4 kg) (04/24 2120)  CBC:  Recent Labs  Lab 07/08/17 1905 07/08/17 1914 07/09/17 0527  WBC 8.2  --  5.5  NEUTROABS 3.3  --   --   HGB 11.7* 13.3 10.5*  HCT 36.7 39.0 32.4*  MCV 90.2  --  89.3  PLT 338  --  296    Basic Metabolic Panel:  Recent Labs  Lab 07/08/17 1905 07/08/17 1914 07/09/17 0527  NA 138 140 138  K 3.4* 3.3* 3.8  CL 106 104 108  CO2 25  --  22  GLUCOSE 193* 186* 100*  BUN 6 5* 5*  CREATININE 0.76 0.70 0.61  CALCIUM 9.2  --  8.5*    Lipid Panel:     Component Value Date/Time   CHOL 147 07/09/2017 0527   TRIG 125 07/09/2017 0527   HDL 37 (L) 07/09/2017 0527   CHOLHDL 4.0 07/09/2017 0527   VLDL 25 07/09/2017 0527   LDLCALC 85 07/09/2017 0527   HgbA1c:  Lab Results  Component Value Date   HGBA1C 4.9 07/09/2017   Urine Drug Screen:     Component Value Date/Time   LABOPIA NONE DETECTED 07/09/2017 0016   COCAINSCRNUR NONE DETECTED 07/09/2017 0016   LABBENZ POSITIVE (A) 07/09/2017 0016   AMPHETMU NONE DETECTED 07/09/2017 0016   THCU NONE DETECTED 07/09/2017 0016    LABBARB NONE DETECTED 07/09/2017 0016    Alcohol Level     Component Value Date/Time   ETH <10 07/08/2017 1905    IMAGING I have personally reviewed the radiological images below and agree with the radiology interpretations.  Ct Angio Head W Or Wo Contrast  Result Date: 07/08/2017 CLINICAL DATA:  47 y/o  F; stroke patient with follow-up scan. EXAM: CT ANGIOGRAPHY HEAD AND NECK TECHNIQUE: Multidetector CT imaging of the head and neck was performed using the standard protocol during bolus administration of intravenous contrast. Multiplanar CT image reconstructions and MIPs were obtained to evaluate the vascular anatomy. Carotid stenosis measurements (when applicable) are obtained utilizing NASCET criteria, using the distal internal carotid diameter as the denominator. CONTRAST:  50mL ISOVUE-370 IOPAMIDOL (ISOVUE-370) INJECTION 76% COMPARISON:  07/08/2017 CT head. 02/19/2014 CT angiogram head and neck. FINDINGS: CTA NECK FINDINGS Aortic arch: Standard branching. Imaged portion shows no evidence of aneurysm or dissection. No significant stenosis of the major arch vessel origins. Right carotid system: No evidence of dissection, stenosis (50% or greater) or occlusion. Left carotid system: No evidence of dissection, stenosis (50% or greater) or occlusion. Vertebral arteries: Codominant. No evidence of dissection, stenosis (50% or greater) or  occlusion. Skeleton: Negative. Other neck: Negative. Upper chest: Negative. Review of the MIP images confirms the above findings CTA HEAD FINDINGS Anterior circulation: Right M2 superior division proximal occlusion. Poor downstream collateralization. Otherwise no significant stenosis, proximal occlusion, aneurysm, or vascular malformation. Posterior circulation: No significant stenosis, proximal occlusion, aneurysm, or vascular malformation. Venous sinuses: As permitted by contrast timing, patent. Anatomic variants: Small anterior communicating artery. No posterior  communicating artery identified, likely hypoplastic or absent. Review of the MIP images confirms the above findings IMPRESSION: 1. Patent carotid and vertebral arteries. No dissection, aneurysm, or hemodynamically significant stenosis utilizing NASCET criteria. 2. Right M2 superior division proximal occlusion. 3. Otherwise patent anterior and posterior intracranial circulation. No additional large vessel occlusion, aneurysm, or significant stenosis. Electronically Signed   By: Mitzi Hansen M.D.   On: 07/08/2017 19:58   Dg Chest 1 View  Result Date: 07/08/2017 CLINICAL DATA:  Code stroke. EXAM: CHEST  1 VIEW COMPARISON:  Chest radiograph Aug 07, 2016 FINDINGS: Cardiomediastinal silhouette is normal. No pleural effusions or focal consolidations. Trachea projects midline and there is no pneumothorax. Soft tissue planes and included osseous structures are non-suspicious. IMPRESSION: Negative. Electronically Signed   By: Awilda Metro M.D.   On: 07/08/2017 23:04   Ct Head Wo Contrast  Result Date: 07/08/2017 CLINICAL DATA:  47 y/o  F; right M2 occlusion for follow-up. EXAM: CT HEAD WITHOUT CONTRAST TECHNIQUE: Contiguous axial images were obtained from the base of the skull through the vertex without intravenous contrast. COMPARISON:  07/08/2017 CT and CTA of the head. FINDINGS: Brain: Developing loss of gray-Loeb differentiation within the right anterolateral frontal lobe, frontal operculum, and insula. No acute hemorrhage. No significant mass effect. No new area of stroke, hemorrhage, or mass effect identified in the brain. No extra-axial collection, hydrocephalus, or effacement of basilar cisterns. Vascular: Calcific atherosclerosis of carotid siphons. Skull: Normal. Negative for fracture or focal lesion. Sinuses/Orbits: Mild paranasal sinus mucosal thickening. Trace opacification within the left mastoid tip. Orbits are unremarkable. Other: None. IMPRESSION: Developing loss of gray-Scoggins  differentiation in the right anterolateral frontal lobe, frontal operculum, and insula. ASPECTS is 7 at this time. No mass effect or hemorrhage. Electronically Signed   By: Mitzi Hansen M.D.   On: 07/08/2017 23:03   Ct Angio Neck W Or Wo Contrast  Result Date: 07/08/2017 CLINICAL DATA:  47 y/o  F; stroke patient with follow-up scan. EXAM: CT ANGIOGRAPHY HEAD AND NECK TECHNIQUE: Multidetector CT imaging of the head and neck was performed using the standard protocol during bolus administration of intravenous contrast. Multiplanar CT image reconstructions and MIPs were obtained to evaluate the vascular anatomy. Carotid stenosis measurements (when applicable) are obtained utilizing NASCET criteria, using the distal internal carotid diameter as the denominator. CONTRAST:  50mL ISOVUE-370 IOPAMIDOL (ISOVUE-370) INJECTION 76% COMPARISON:  07/08/2017 CT head. 02/19/2014 CT angiogram head and neck. FINDINGS: CTA NECK FINDINGS Aortic arch: Standard branching. Imaged portion shows no evidence of aneurysm or dissection. No significant stenosis of the major arch vessel origins. Right carotid system: No evidence of dissection, stenosis (50% or greater) or occlusion. Left carotid system: No evidence of dissection, stenosis (50% or greater) or occlusion. Vertebral arteries: Codominant. No evidence of dissection, stenosis (50% or greater) or occlusion. Skeleton: Negative. Other neck: Negative. Upper chest: Negative. Review of the MIP images confirms the above findings CTA HEAD FINDINGS Anterior circulation: Right M2 superior division proximal occlusion. Poor downstream collateralization. Otherwise no significant stenosis, proximal occlusion, aneurysm, or vascular malformation. Posterior circulation: No significant stenosis,  proximal occlusion, aneurysm, or vascular malformation. Venous sinuses: As permitted by contrast timing, patent. Anatomic variants: Small anterior communicating artery. No posterior communicating  artery identified, likely hypoplastic or absent. Review of the MIP images confirms the above findings IMPRESSION: 1. Patent carotid and vertebral arteries. No dissection, aneurysm, or hemodynamically significant stenosis utilizing NASCET criteria. 2. Right M2 superior division proximal occlusion. 3. Otherwise patent anterior and posterior intracranial circulation. No additional large vessel occlusion, aneurysm, or significant stenosis. Electronically Signed   By: Mitzi Hansen M.D.   On: 07/08/2017 19:58   Ct Head Code Stroke Wo Contrast  Result Date: 07/08/2017 CLINICAL DATA:  Code stroke. Initial evaluation for acute left arm weakness. EXAM: CT HEAD WITHOUT CONTRAST TECHNIQUE: Contiguous axial images were obtained from the base of the skull through the vertex without intravenous contrast. COMPARISON:  Prior CT from 02/19/2016. FINDINGS: Brain: Age-related cerebral atrophy. Small remote left PCA territory infarct. No acute intracranial hemorrhage. No acute large vessel territory infarct. No mass lesion, midline shift or mass effect. No hydrocephalus. No extra-axial fluid collection. Vascular: No hyperdense vessel. Skull: Scalp soft tissues and calvarium within normal limits. Sinuses/Orbits: Globes and orbital soft tissues normal. Scattered mucosal thickening within the ethmoidal air cells, maxillary sinuses, and sphenoid sinuses. No mastoid effusion. Other: 7 ASPECTS ([place]berta Stroke Program Early CT Score) - Ganglionic level infarction (caudate, lentiform nuclei, internal capsule, insula, M1-M3 cortex): 7 - Supraganglionic infarction (M4-M6 cortex): 3 Total score (0-10 with 10 being normal): 10 IMPRESSION: 1. No acute intracranial infarct or other process identified. 2. ASPECTS is 10. 3. Remote left PCA territory infarct. These results were communicated to Dr. Laurence Slate at 7:35 pmon 4/24/2019by text page via the [facility] messaging system. Electronically Signed   By: Rise Mu M.D.   On:  07/08/2017 19:40   Transthoracic Echocardiogram - pending  MRI pending    PHYSICAL EXAM  Temp:  [98.2 F (36.8 C)-98.9 F (37.2 C)] 98.3 F (36.8 C) (04/25 0800) Pulse Rate:  [53-80] 63 (04/25 0900) Resp:  [13-28] 17 (04/25 0900) BP: (121-159)/(79-104) 133/86 (04/25 0900) SpO2:  [95 %-100 %] 97 % (04/25 0900) Weight:  [172 lb 13.5 oz (78.4 kg)-175 lb 4.3 oz (79.5 kg)] 172 lb 13.5 oz (78.4 kg) (04/24 2120)  General - Well nourished, well developed, in no apparent distress.  Ophthalmologic - Fundi not visualized due to eye movement.  Cardiovascular - Regular rate and rhythm.  Mental Status -  Level of arousal and orientation to time, place, and person were intact. Language including expression, naming, repetition, comprehension was assessed and found intact.  Cranial Nerves II - XII - II - Visual field intact OU. III, IV, VI - Extraocular movements intact, however mild right gaze preference. V - Facial sensation intact bilaterally. VII - left facial droop. VIII - Hearing & vestibular intact bilaterally. X - Palate elevates symmetrically. XI - Chin turning & shoulder shrug intact bilaterally. XII - Tongue protrusion intact.  Motor Strength - The patient's strength was normal in all extremities except mild LUE pronator drift.  Bulk was normal and fasciculations were absent.   Motor Tone - Muscle tone was assessed at the neck and appendages and was normal.  Reflexes - The patient's reflexes were 1+ in all extremities and she had no pathological reflexes.  Sensory - Light touch, temperature/pinprick were assessed and were symmetrical.    Coordination - The patient had normal movements in the handswith no ataxia or dysmetria.  Tremor was absent.  Gait and Station - deferred  ASSESSMENT/PLAN Lindsey [name] is a 47 y.o. 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 She did not receive IV t-PA due to Xarelto use.   Stroke: Right MCA infarct due to right M2 occlusion with partial recannulization, etiology likely due to coagulopathy versus LV thrombus with noncompliance of anticoagulation.  Resultant left facial droop  CT head old left PCA infarct, no acute abnormality  MRI head pending  CTA head and neck right M2 occlusion, bilateral V4 stenosis  2D Echo - pending  LDL 85  HgbA1c - 4.9  VTE prophylaxis - SCDs  Fall precautions  Diet Heart Room service appropriate? Yes; Fluid consistency: Thin  clopidogrel 75 mg daily and Xarelto (rivaroxaban) daily prior to admission, now on clopidogrel 75 mg daily and Xarelto (rivaroxaban) daily  Patient counseled to be compliant with her antithrombotic medications  Ongoing aggressive stroke risk factor management  Therapy recommendations:  pending  Disposition:  Pending  Hx of LV thrombus  TTE in 06/2013 and 03/2015 showed persistent LV thrombus  INR fluctuating on Coumadin, therefore changed to Xarelto by cardiology  Patient missed Xarelto several doses before Tuesday  Last Xarelto Tuesday night 07/07/17  TTE pending  CAD/MI/NSTEMI  In 2000/29/2016/2017 as per record  Status post stent in the past  On Plavix and Lipitor  Followed by cardiology  History of TIA vs. Complicated migraine   02/2014 admitted for headache, blurry vision and left-sided numbness.  She was off Coumadin at that time for dental procedure.  MRI negative for acute stroke CTA head and neck negative.  A1c 5.0 LDL 80.  Her episode consider TIA versus complicated migraine.  Coumadin resumed as well as Lipitor 80.  Hypertension  Stable . Permissive hypertension (OK if < 220/120) but gradually normalize in 5-7 days . Long-term BP goal normotensive  Hyperlipidemia  Lipid lowering medication PTA:   lipitor 40  LDL 85, goal < 70  Current lipid lowering medication:liptor 40  Continue statin at discharge  Tobacco abuse  Current smoker  Smoking cessation counseling provided  Pt is willing to quit  Other Stroke Risk Factors  Hx stroke/TIA - CT old left PCA infarct  ??? Anti AT III deficiency - has seen Dr. Myna Hidalgo in the past who rechecked and it was negative - on Xarelto  Hx of cocaine use - current UDS neg for cocaine  Other Active Problems  Hypokalemia - supplement and resolved   Hospital day # 1  This patient is critically ill due to acute stroke, LV thrombus, multiple MI/STEMI and at significant risk of neurological worsening, death form recurrent stroke, hemorrhagic infarct, heart failure, heart attack. This patient's care requires constant monitoring of vital signs, hemodynamics, respiratory and cardiac monitoring, review of multiple databases, neurological assessment, discussion with family, other specialists and medical decision making of high complexity. I spent 40 minutes of neurocritical care time in the care of this patient.  Marvel Plan, MD PhD Stroke Neurology 07/09/2017 11:00 AM   To contact Stroke Continuity provider, please refer to [email or web address]. After hours, contact General Neurology

## 2017-07-09 NOTE — Evaluation (Signed)
Occupational Therapy Evaluation Patient Details Name: Lindsey [name] MRN: [id number] DOB: [birth year] Today's Date: 07/09/2017    History of Present Illness [name]. PMHx CVA at age 47, TIA 9, MI 2009, antithrombin III deficiency, apical mural thrombus, CAD, hypertension, hyperlipidemia.   Clinical Impression   Pt admitted with above. She demonstrates the below listed deficits and will benefit from continued OT to maximize safety and independence with BADLs.  Pt presents to OT with Lt inattention, as well as impaired cognition including deficits with attention, sequencing, problem solving, working memory, perseveration, as well as poor awareness of deficits.  She currently requires min - mod A for ADLs due to cognitive and perceptual deficits.   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  She will need 24 hour assist at discharge and insists that her mother and daughters can provide this - no family present to confirm.        Follow Up Recommendations  Home health OT  (optimally recommend OPOT, but pt doesn't think family can provide transport) ;Supervision/Assistance - 24 hour    Equipment Recommendations  Tub/shower seat    Recommendations for Other Services       Precautions / Restrictions Precautions Precautions: Fall Precaution Comments: left inattention      Mobility Bed Mobility Overal bed mobility: Needs Assistance Bed Mobility: Supine to Sit     Supine to sit: Supervision     General bed mobility comments: supervision for lines and safety  Transfers Overall transfer level: Needs assistance   Transfers: Sit to/from Stand Sit to Stand: Min guard         General transfer comment: guarding for lines and safety, assist to move linens with toileting unaware of need to fully move gown    Balance Overall balance assessment:  Needs assistance   Sitting balance-Leahy Scale: Good       Standing balance-Leahy Scale: Good                             ADL either performed or assessed with clinical judgement   ADL Overall ADL's : Needs assistance/impaired Eating/Feeding: Supervision/ safety;Sitting   Grooming: Wash/dry hands;Wash/dry face;Oral care;Minimal assistance;Standing Grooming Details (indicate cue type and reason): assist for sequencing, organization, problem solving and perseveration  Upper Body Bathing: Moderate assistance;Sitting Upper Body Bathing Details (indicate cue type and reason): for thoroughness Lower Body Bathing: Moderate assistance;Sit to/from stand Lower Body Bathing Details (indicate cue type and reason): for thoroughness  Upper Body Dressing : Moderate assistance;Sitting   Lower Body Dressing: Sit to/from stand;Moderate assistance Lower Body Dressing Details (indicate cue type and reason): requires assist to thread Lt LE through pant leg as well as assist pulling pants over Lt hip and with pulling socks over feet  Toilet Transfer: Ambulation;Comfort height toilet;Min guard   Toileting- Clothing Manipulation and Hygiene: Minimal assistance;Sit to/from stand       Functional mobility during ADLs: Min guard;Minimal assistance       Vision Patient Visual Report: Peripheral vision impairment Vision Assessment?: Yes Eye Alignment: Within Functional Limits Ocular Range of Motion: Within Functional Limits Tracking/Visual Pursuits: Other (comment) Visual Fields: Other (comment) Additional Comments: Pt frequently loses fixation during pursuits.  She sometimes will initiata a saccade to re-fixate on object, but often does not re-locate of re-fixate on object.  She  indicates she does not see PT approaching her on the right, but unable to establish if this is baseline, or new.  She was unable to maintain attention to complete formal visual assessment      Perception  Perception Perception Tested?: Yes Perception Deficits: Inattention/neglect Inattention/Neglect: Does not attend to left visual field;Does not attend to left side of body Comments: min - mod cues to locate items on Lt and to fully incorporate Lt UE into activity.  Runs into items on Lt when ambulating     Praxis Praxis Praxis tested?: Deficits Deficits: Perseveration;Initiation    Pertinent Vitals/Pain Pain Assessment: Faces Pain Score: 4  Faces Pain Scale: Hurts little more Pain Location: head Pain Descriptors / Indicators: Aching;Headache Pain Intervention(s): Monitored during session;Limited activity within patient's tolerance     Hand Dominance Left   Extremity/Trunk Assessment Upper Extremity Assessment Upper Extremity Assessment: LUE deficits/detail LUE Deficits / Details: Pt demonstrates decreased oposition of Lt hand.  She appears to have proprioceptive deficits as assessed through function, and when asks, she states "it feels like its dead".  Demonstrates difficulty pulling on socks with Lt UE and with manipulating objects in Lt UE  LUE Sensation: decreased proprioception LUE Coordination: decreased fine motor   Lower Extremity Assessment Lower Extremity Assessment: Defer to PT evaluation   Cervical / Trunk Assessment Cervical / Trunk Assessment: Other exceptions Cervical / Trunk Exceptions: Mild Rt gaze preference    Communication Communication Communication: No difficulties   Cognition Arousal/Alertness: Awake/alert Behavior During Therapy: Flat affect Overall Cognitive Status: Impaired/Different from baseline Area of Impairment: Attention;Memory;Following commands;Safety/judgement;Awareness;Problem solving                   Current Attention Level: Sustained Memory: Decreased short-term memory Following Commands: Follows one step commands consistently;Follows one step commands with increased time Safety/Judgement: Decreased awareness of  deficits;Decreased awareness of safety Awareness: Intellectual Problem Solving: Slow processing;Decreased initiation;Requires verbal cues;Difficulty sequencing;Requires tactile cues General Comments: Pt noted to perseverate on wiping face requiring mod cues to extinguish behaviors, she requires mod - max cues to sustain attention to simple ADL tasks.  She required mod cues to rinse Lt hand when washing hands and to turn water off.  Pt reports she has no deficits other than a headache    General Comments       Exercises     Shoulder Instructions      Home Living Family/patient expects to be discharged to:: Private residence Living Arrangements: Children Available Help at Discharge: Family;Available 24 hours/day Type of Home: House Home Access: Stairs to enter [employer] of Steps: 2 Entrance Stairs-Rails: Right;Left Home Layout: Two level;Bed/bath upstairs     Bathroom Shower/Tub: Chief Strategy Officer: Standard     Home Equipment: None   Additional Comments: Pt insists her family can provide 24 hour assist, although she states her oldest daughter is a Consulting civil engineer and working, and her mother works 7-3 daily.  Youngest daughter is 45 y.o       Prior Functioning/Environment Level of Independence: Independent        Comments: works at a [employer] as a Conservation officer, nature, lives with 13 and 61 yo kids        OT Problem List: Decreased strength;Decreased activity tolerance;Impaired balance (sitting and/or standing);Impaired vision/perception;Decreased coordination;Decreased cognition;Decreased safety awareness;Impaired UE functional use      OT Treatment/Interventions: Self-care/ADL training;DME and/or AE instruction;Therapeutic activities;Cognitive remediation/compensation;Visual/perceptual remediation/compensation;Patient/family education;Balance training    OT Goals(Current goals can be found in the care  plan section) Acute Rehab OT Goals Patient Stated  Goal: return to home and work OT Goal Formulation: With patient Time For Goal Achievement: 07/23/17 Potential to Achieve Goals: Good ADL Goals Pt Will Perform Grooming: with supervision;standing Pt Will Perform Upper Body Bathing: with supervision;sitting Pt Will Perform Lower Body Bathing: with supervision;sit to/from stand Pt Will Perform Upper Body Dressing: with supervision;sitting Pt Will Perform Lower Body Dressing: with supervision;sit to/from stand Pt Will Transfer to Toilet: with supervision;ambulating;regular height toilet;bedside commode;grab bars Pt Will Perform Toileting - Clothing Manipulation and hygiene: with supervision;sit to/from stand Additional ADL Goal #1: Pt will locate items on Lt with no more than  min cues consistently  OT Frequency: Min 2X/week   Barriers to D/C: Decreased caregiver support  unsure if she has adequate assist at home        Co-evaluation PT/OT/SLP Co-Evaluation/Treatment: Yes Reason for Co-Treatment: Complexity of the patient's impairments (multi-system involvement);For patient/therapist safety;Necessary to address cognition/behavior during functional activity;To address functional/ADL transfers   OT goals addressed during session: ADL's and self-care      AM-PAC PT "6 Clicks" Daily Activity     Outcome Measure Help from another person eating meals?: A Little Help from another person taking care of personal grooming?: A Little Help from another person toileting, which includes using toliet, bedpan, or urinal?: A Lot Help from another person bathing (including washing, rinsing, drying)?: A Lot Help from another person to put on and taking off regular upper body clothing?: A Lot Help from another person to put on and taking off regular lower body clothing?: A Lot 6 Click Score: 14   End of Session Equipment Utilized During Treatment: Gait belt Nurse Communication: Mobility status  Activity Tolerance: Patient tolerated treatment  well Patient left: in chair;with call bell/phone within reach;with chair alarm set;with nursing/sitter in room  OT Visit Diagnosis: Unsteadiness on feet (R26.81);Cognitive communication deficit (R41.841)                Time: 6283-1517 OT Time Calculation (min): 43 min Charges:  OT General Charges $OT Visit: 1 Visit OT Evaluation $OT Eval Moderate Complexity: 1 Mod G-Codes:     [employer], OTR/L [phone]   Jeani Hawking M 07/09/2017, 2:55 PM

## 2017-07-09 NOTE — Plan of Care (Signed)
Care plan updated. Fraizer, Kyaire Gruenewald RN BSN 

## 2017-07-09 NOTE — Progress Notes (Signed)
  Echocardiogram 2D Echocardiogram has been performed.  Leta Jungling M 07/09/2017, 10:33 AM

## 2017-07-09 NOTE — Progress Notes (Signed)
Referring Physician(s): CODE STROKE  Supervising Physician: Julieanne Cotton  Patient Status:  [facility] - In-pt  Chief Complaint: Left facial droop, left-sided weakness  Subjective:  Hx CVA at age 47, TIA 2010, MI 2009, antithrombin III deficiency, apical mural thrombus, CAD, hypertension, hyperlipidemia. Patient presented to ED 07/08/2017 with symptoms of left facial droop and left-sided weakness- code stroke was called.  Patient underwent diagnostic cerebral angiogram with Dr. Corliss Skains 07/08/2017: 1. Recanalizing right MCA non-dominant superior division.  CT head 07/08/2017: 1. Developing loss of gray-Lanum differentiation in the right anterolateral frontal lobe, frontal operculum, and insula. ASPECTS is 7 at this time. No mass effect or hemorrhage.  Patient awake and alert laying in bed this AM with no complaints at this time. Demonstrates left facial droop. Denies headache, weakness, numbness/tingling, dizziness, vision changes, hearing changes, tinnitus, or speech difficulty.  Patient is currently taking Xarelto 20 mg once daily.  Allergies: Patient has no known allergies.  Medications: Prior to Admission medications   Medication Sig Start Date End Date Taking? Authorizing Provider  acetaminophen (TYLENOL) 500 MG tablet Take 1,000 mg by mouth every 6 (six) hours as needed for headache (pain).    [provider]  atorvastatin (LIPITOR) 40 MG tablet Take 1 tablet (40 mg total) by mouth daily. 08/16/16   Rodolph Bong, MD  carvedilol (COREG) 25 MG tablet Take 1 tablet (25 mg total) by mouth 2 (two) times daily with a meal. 08/16/16   Rodolph Bong, MD  clopidogrel (PLAVIX) 75 MG tablet Take 1 tablet (75 mg total) by mouth daily. 08/16/16   Rodolph Bong, MD  docusate sodium (COLACE) 100 MG capsule Take 1 capsule (100 mg total) by mouth 2 (two) times daily. 08/16/16   Rodolph Bong, MD  doxycycline (VIBRAMYCIN) 100 MG capsule Take 1 capsule (100 mg total) by  mouth 2 (two) times daily. 06/23/17   Tilden Fossa, MD  HYDROcodone-acetaminophen (NORCO/VICODIN) 5-325 MG tablet Take 1 tablet by mouth every 6 (six) hours as needed. 06/23/17   Tilden Fossa, MD  hydrocortisone (ANUSOL-HC) 2.5 % rectal cream Apply rectally 2 times daily prn 07/21/15   Cathren Laine, MD  methocarbamol (ROBAXIN) 500 MG tablet Take 1 tablet (500 mg total) by mouth 3 (three) times daily. 08/16/16   Rodolph Bong, MD  metroNIDAZOLE (FLAGYL) 500 MG tablet Take 1 tablet (500 mg total) by mouth 2 (two) times daily. 06/23/17   Tilden Fossa, MD  nitroGLYCERIN (NITROSTAT) 0.4 MG SL tablet Place 1 tablet (0.4 mg total) under the tongue every 5 (five) minutes as needed for chest pain (up to 3 doses). 08/16/16   Rodolph Bong, MD  ondansetron (ZOFRAN ODT) 8 MG disintegrating tablet Take 1 tablet (8 mg total) by mouth every 8 (eight) hours as needed for nausea or vomiting. 08/16/16   Rodolph Bong, MD  oxyCODONE (OXY IR/ROXICODONE) 5 MG immediate release tablet Take 1-2 tablets (5-10 mg total) by mouth every 4 (four) hours as needed for moderate pain. 08/16/16   Rodolph Bong, MD  pantoprazole (PROTONIX) 40 MG tablet Take 1 tablet (40 mg total) by mouth daily. 08/16/16   Rodolph Bong, MD  rivaroxaban (XARELTO) 20 MG TABS tablet Take 1 tablet (20 mg total) by mouth daily with supper. 08/16/16   Rodolph Bong, MD  vitamin B-12 (CYANOCOBALAMIN) 1000 MCG tablet Take 1 tablet (1,000 mcg total) by mouth daily. 08/16/16   Rodolph Bong, MD     Vital Signs: BP 133/86  Pulse 63   Temp 98.3 F (36.8 C) (Axillary)   Resp 17   Ht 5\' 6"  (1.676 m)   Wt 172 lb 13.5 oz (78.4 kg)   SpO2 97%   BMI 27.90 kg/m   Physical Exam  Constitutional: She appears well-developed and well-nourished. No distress.  Cardiovascular: Normal rate, regular rhythm and normal heart sounds.  No murmur heard. Pulmonary/Chest: Effort normal and breath sounds normal. She has no wheezes.  Neurological:    Alert, awake, and oriented x3. Speech and comprehension intact. PERRL bilaterally. EOMs intact bilaterally without nystagmus or subjective diplopia. Visual fields not assessed. Demonstrates left facial droop. Tongue midline. Motor power symmetric proportional to effort. No pronator drift. Fine motor and coordination intact and symmetric. Gait not assessed. Romberg not assessed. Heel to toe not assessed. Distal pulses 2+ bilaterally.  Skin: Skin is warm and dry.  Right groin incision soft without hematoma or active bleeding.  Psychiatric: She has a normal mood and affect. Her behavior is normal. Judgment and thought content normal.  Nursing note and vitals reviewed.   Imaging: Ct Angio Head W Or Wo Contrast  Result Date: 07/08/2017 CLINICAL DATA:  47 y/o  F; stroke patient with follow-up scan. EXAM: CT ANGIOGRAPHY HEAD AND NECK TECHNIQUE: Multidetector CT imaging of the head and neck was performed using the standard protocol during bolus administration of intravenous contrast. Multiplanar CT image reconstructions and MIPs were obtained to evaluate the vascular anatomy. Carotid stenosis measurements (when applicable) are obtained utilizing NASCET criteria, using the distal internal carotid diameter as the denominator. CONTRAST:  50mL ISOVUE-370 IOPAMIDOL (ISOVUE-370) INJECTION 76% COMPARISON:  07/08/2017 CT head. 02/19/2014 CT angiogram head and neck. FINDINGS: CTA NECK FINDINGS Aortic arch: Standard branching. Imaged portion shows no evidence of aneurysm or dissection. No significant stenosis of the major arch vessel origins. Right carotid system: No evidence of dissection, stenosis (50% or greater) or occlusion. Left carotid system: No evidence of dissection, stenosis (50% or greater) or occlusion. Vertebral arteries: Codominant. No evidence of dissection, stenosis (50% or greater) or occlusion. Skeleton: Negative. Other neck: Negative. Upper chest: Negative. Review of the MIP images  confirms the above findings CTA HEAD FINDINGS Anterior circulation: Right M2 superior division proximal occlusion. Poor downstream collateralization. Otherwise no significant stenosis, proximal occlusion, aneurysm, or vascular malformation. Posterior circulation: No significant stenosis, proximal occlusion, aneurysm, or vascular malformation. Venous sinuses: As permitted by contrast timing, patent. Anatomic variants: Small anterior communicating artery. No posterior communicating artery identified, likely hypoplastic or absent. Review of the MIP images confirms the above findings IMPRESSION: 1. Patent carotid and vertebral arteries. No dissection, aneurysm, or hemodynamically significant stenosis utilizing NASCET criteria. 2. Right M2 superior division proximal occlusion. 3. Otherwise patent anterior and posterior intracranial circulation. No additional large vessel occlusion, aneurysm, or significant stenosis. Electronically Signed   By: Mitzi Hansen M.D.   On: 07/08/2017 19:58   Dg Chest 1 View  Result Date: 07/08/2017 CLINICAL DATA:  Code stroke. EXAM: CHEST  1 VIEW COMPARISON:  Chest radiograph Aug 07, 2016 FINDINGS: Cardiomediastinal silhouette is normal. No pleural effusions or focal consolidations. Trachea projects midline and there is no pneumothorax. Soft tissue planes and included osseous structures are non-suspicious. IMPRESSION: Negative. Electronically Signed   By: Awilda Metro M.D.   On: 07/08/2017 23:04   Ct Head Wo Contrast  Result Date: 07/08/2017 CLINICAL DATA:  47 y/o  F; right M2 occlusion for follow-up. EXAM: CT HEAD WITHOUT CONTRAST TECHNIQUE: Contiguous axial images were obtained from the base of  the skull through the vertex without intravenous contrast. COMPARISON:  07/08/2017 CT and CTA of the head. FINDINGS: Brain: Developing loss of gray-Beavin differentiation within the right anterolateral frontal lobe, frontal operculum, and insula. No acute hemorrhage. No  significant mass effect. No new area of stroke, hemorrhage, or mass effect identified in the brain. No extra-axial collection, hydrocephalus, or effacement of basilar cisterns. Vascular: Calcific atherosclerosis of carotid siphons. Skull: Normal. Negative for fracture or focal lesion. Sinuses/Orbits: Mild paranasal sinus mucosal thickening. Trace opacification within the left mastoid tip. Orbits are unremarkable. Other: None. IMPRESSION: Developing loss of gray-Economos differentiation in the right anterolateral frontal lobe, frontal operculum, and insula. ASPECTS is 7 at this time. No mass effect or hemorrhage. Electronically Signed   By: Mitzi Hansen M.D.   On: 07/08/2017 23:03   Ct Angio Neck W Or Wo Contrast  Result Date: 07/08/2017 CLINICAL DATA:  47 y/o  F; stroke patient with follow-up scan. EXAM: CT ANGIOGRAPHY HEAD AND NECK TECHNIQUE: Multidetector CT imaging of the head and neck was performed using the standard protocol during bolus administration of intravenous contrast. Multiplanar CT image reconstructions and MIPs were obtained to evaluate the vascular anatomy. Carotid stenosis measurements (when applicable) are obtained utilizing NASCET criteria, using the distal internal carotid diameter as the denominator. CONTRAST:  50mL ISOVUE-370 IOPAMIDOL (ISOVUE-370) INJECTION 76% COMPARISON:  07/08/2017 CT head. 02/19/2014 CT angiogram head and neck. FINDINGS: CTA NECK FINDINGS Aortic arch: Standard branching. Imaged portion shows no evidence of aneurysm or dissection. No significant stenosis of the major arch vessel origins. Right carotid system: No evidence of dissection, stenosis (50% or greater) or occlusion. Left carotid system: No evidence of dissection, stenosis (50% or greater) or occlusion. Vertebral arteries: Codominant. No evidence of dissection, stenosis (50% or greater) or occlusion. Skeleton: Negative. Other neck: Negative. Upper chest: Negative. Review of the MIP images confirms the  above findings CTA HEAD FINDINGS Anterior circulation: Right M2 superior division proximal occlusion. Poor downstream collateralization. Otherwise no significant stenosis, proximal occlusion, aneurysm, or vascular malformation. Posterior circulation: No significant stenosis, proximal occlusion, aneurysm, or vascular malformation. Venous sinuses: As permitted by contrast timing, patent. Anatomic variants: Small anterior communicating artery. No posterior communicating artery identified, likely hypoplastic or absent. Review of the MIP images confirms the above findings IMPRESSION: 1. Patent carotid and vertebral arteries. No dissection, aneurysm, or hemodynamically significant stenosis utilizing NASCET criteria. 2. Right M2 superior division proximal occlusion. 3. Otherwise patent anterior and posterior intracranial circulation. No additional large vessel occlusion, aneurysm, or significant stenosis. Electronically Signed   By: Mitzi Hansen M.D.   On: 07/08/2017 19:58   Ct Head Code Stroke Wo Contrast  Result Date: 07/08/2017 CLINICAL DATA:  Code stroke. Initial evaluation for acute left arm weakness. EXAM: CT HEAD WITHOUT CONTRAST TECHNIQUE: Contiguous axial images were obtained from the base of the skull through the vertex without intravenous contrast. COMPARISON:  Prior CT from 02/19/2016. FINDINGS: Brain: Age-related cerebral atrophy. Small remote left PCA territory infarct. No acute intracranial hemorrhage. No acute large vessel territory infarct. No mass lesion, midline shift or mass effect. No hydrocephalus. No extra-axial fluid collection. Vascular: No hyperdense vessel. Skull: Scalp soft tissues and calvarium within normal limits. Sinuses/Orbits: Globes and orbital soft tissues normal. Scattered mucosal thickening within the ethmoidal air cells, maxillary sinuses, and sphenoid sinuses. No mastoid effusion. Other: 7 ASPECTS ([place]ta Stroke Program Early CT Score) - Ganglionic level infarction  (caudate, lentiform nuclei, internal capsule, insula, M1-M3 cortex): 7 - Supraganglionic infarction (M4-M6 cortex): 3 Total score (  0-10 with 10 being normal): 10 IMPRESSION: 1. No acute intracranial infarct or other process identified. 2. ASPECTS is 10. 3. Remote left PCA territory infarct. These results were communicated to Dr. Laurence Slate at 7:35 pmon 4/24/2019by text page via the [facility] messaging system. Electronically Signed   By: Rise Mu M.D.   On: 07/08/2017 19:40    Labs:  CBC: Recent Labs    08/16/16 0516 06/23/17 1033 07/08/17 1905 07/08/17 1914 07/09/17 0527  WBC 6.6 12.6* 8.2  --  5.5  HGB 11.5* 12.8 11.7* 13.3 10.5*  HCT 36.9 39.5 36.7 39.0 32.4*  PLT 233 184 338  --  296    COAGS: Recent Labs    07/08/17 1905  INR 0.94  APTT 28    BMP: Recent Labs    08/16/16 0516 06/23/17 1033 07/08/17 1905 07/08/17 1914 07/09/17 0527  NA 133* 133* 138 140 138  K 3.4* 3.8 3.4* 3.3* 3.8  CL 98* 102 106 104 108  CO2 23 20* 25  --  22  GLUCOSE 115* 102* 193* 186* 100*  BUN 10 <5* 6 5* 5*  CALCIUM 9.3 9.2 9.2  --  8.5*  CREATININE 0.86 0.60 0.76 0.70 0.61  GFRNONAA >60 >60 >60  --  >60  GFRAA >60 >60 >60  --  >60    LIVER FUNCTION TESTS: Recent Labs    08/14/16 0428 08/15/16 0619 06/23/17 1033 07/08/17 1905  BILITOT 0.5 0.6 1.0 0.7  AST 22 27 37 25  ALT 14 20 29 16   ALKPHOS 71 77 117 84  PROT 5.6* 6.6 6.9 6.9  ALBUMIN 3.0* 3.3* 3.5 3.5    Assessment and Plan:  Left facial droop. Left-sided weakness. Discussed case with patient- weakness improved, still demonstrates left facial droop. Right groin incision stable. Appreciate and agree with neurology management.  Electronically Signed: Elwin Mocha, PA-C 07/09/2017, 9:19 AM   I spent a total of 15 Minutes at the the patient's bedside AND on the patient's hospital floor or unit, greater than 50% of which was counseling/coordinating care for left facial droop and left-sided weakness.

## 2017-07-09 NOTE — Progress Notes (Signed)
SLP Cancellation Note  Patient Details Name: Lindsey [name] MRN: [id number] DOB: [birth year]   Cancelled treatment:       Reason Eval/Treat Not Completed: Patient at procedure or test/unavailable   Jaydah Stahle, Riley Nearing 07/09/2017, 10:02 AM

## 2017-07-09 NOTE — Progress Notes (Signed)
OT Cancellation Note  Patient Details Name: TANASHIA BONER MRN: [id number] DOB: 06-08-1970   Cancelled Treatment:    Reason Eval/Treat Not Completed: Patient not medically ready.  Pt on bedrest due to femoral sheath.  Will reattempt.  Clive, OTR/L [phone]   Jeani Hawking M 07/09/2017, 7:02 AM

## 2017-07-10 DIAGNOSIS — I63411 Cerebral infarction due to embolism of right middle cerebral artery: Secondary | ICD-10-CM

## 2017-07-10 DIAGNOSIS — Z7901 Long term (current) use of anticoagulants: Secondary | ICD-10-CM

## 2017-07-10 DIAGNOSIS — D689 Coagulation defect, unspecified: Secondary | ICD-10-CM

## 2017-07-10 LAB — BASIC METABOLIC PANEL
Anion gap: 13 (ref 5–15)
BUN: 5 mg/dL — AB (ref 6–20)
CHLORIDE: 103 mmol/L (ref 101–111)
CO2: 23 mmol/L (ref 22–32)
CREATININE: 0.72 mg/dL (ref 0.44–1.00)
Calcium: 8.8 mg/dL — ABNORMAL LOW (ref 8.9–10.3)
GFR calc Af Amer: >60 mL/min (ref >60)
GFR calc non Af Amer: >60 mL/min (ref >60)
GLUCOSE: 100 mg/dL — AB (ref 65–99)
Potassium: 3.6 mmol/L (ref 3.5–5.1)
Sodium: 139 mmol/L (ref 135–145)

## 2017-07-10 LAB — CBC
HEMATOCRIT: 35 % — AB (ref 36.0–46.0)
HEMOGLOBIN: 11.1 g/dL — AB (ref 12.0–15.0)
MCH: 28.5 pg (ref 26.0–34.0)
MCHC: 31.7 g/dL (ref 30.0–36.0)
MCV: 90 fL (ref 78.0–100.0)
Platelets: 287 10*3/uL (ref 150–400)
RBC: 3.89 MIL/uL (ref 3.87–5.11)
RDW: 13.2 % (ref 11.5–15.5)
WBC: 5.4 10*3/uL (ref 4.0–10.5)

## 2017-07-10 MED ORDER — BUTALBITAL-APAP-CAFFEINE 50-325-40 MG PO TABS
1.0000 | ORAL_TABLET | Freq: Four times a day (QID) | ORAL | Status: DC | PRN
  Administered 2017-07-10 – 2017-07-13 (×8): 1 via ORAL
  Filled 2017-07-10 (×8): qty 1

## 2017-07-10 NOTE — Progress Notes (Addendum)
Referring Physician(s): CODE STROKE  Supervising Physician: Julieanne Cotton  Patient Status:  [facility] - In-pt  Chief Complaint: Left facial droop, left-sided weakness  Subjective:  Left facial droop, left-sided weakness. Hx CVA at age 47, TIA 47, MI 2009, antithrombin III deficiency, apical mural thrombus, CAD, hypertension, hyperlipidemia. Patient presented to ED 07/08/2017 with symptoms of left facial droop and left-sided weakness- code stroke was called.  Patient underwent diagnostic cerebral angiogram with Dr. Corliss Skains 07/08/2017: 1. Recanalizing right MCA non-dominant superior division.  MRI brain 07/09/2017: 1. Right lateral frontal lobe and insula acute/early subacute infarction, approximately 32 cc, similar in distribution to prior CT of head given differences in technique. No hemorrhage or mass effect. 2. Otherwise unremarkable MRI of the brain.  Patient awake and alert sitting in bed eating breakfast. Demonstrates left facial droop. Complains of right-sided temporal headache, rated 6/10. Complains of left hand weakness. States she spilled coffee on herself this AM. Denies numbness/tingling, dizziness, vision changes, hearing changes, tinnitus, or speech difficulty. Right groin incision c/d/i.  Patient is currently taking Plavix 75 mg once daily.  Allergies: Patient has no known allergies.  Medications: Prior to Admission medications   Medication Sig Start Date End Date Taking? Authorizing Provider  acetaminophen (TYLENOL) 500 MG tablet Take 1,000 mg by mouth every 6 (six) hours as needed for headache (pain).   Yes [provider]  amLODipine (NORVASC) 10 MG tablet Take 10 mg by mouth daily. 06/24/17  Yes [provider]  atorvastatin (LIPITOR) 40 MG tablet Take 1 tablet (40 mg total) by mouth daily. 08/16/16  Yes Rodolph Bong, MD  carvedilol (COREG) 25 MG tablet Take 1 tablet (25 mg total) by mouth 2 (two) times daily with a meal. 08/16/16   Yes Rodolph Bong, MD  clopidogrel (PLAVIX) 75 MG tablet Take 1 tablet (75 mg total) by mouth daily. 08/16/16  Yes Rodolph Bong, MD  doxycycline (VIBRAMYCIN) 100 MG capsule Take 1 capsule (100 mg total) by mouth 2 (two) times daily. 06/23/17  Yes Tilden Fossa, MD  hydrochlorothiazide (HYDRODIURIL) 25 MG tablet Take 25 mg by mouth daily. 06/24/17  Yes [provider]  HYDROcodone-acetaminophen (NORCO/VICODIN) 5-325 MG tablet Take 1 tablet by mouth every 6 (six) hours as needed. 06/23/17  Yes Tilden Fossa, MD  hydrocortisone (ANUSOL-HC) 2.5 % rectal cream Apply rectally 2 times daily prn 07/21/15  Yes Cathren Laine, MD  metroNIDAZOLE (FLAGYL) 500 MG tablet Take 1 tablet (500 mg total) by mouth 2 (two) times daily. 06/23/17  Yes Tilden Fossa, MD  nitroGLYCERIN (NITROSTAT) 0.4 MG SL tablet Place 1 tablet (0.4 mg total) under the tongue every 5 (five) minutes as needed for chest pain (up to 3 doses). 08/16/16  Yes Rodolph Bong, MD  ondansetron (ZOFRAN ODT) 8 MG disintegrating tablet Take 1 tablet (8 mg total) by mouth every 8 (eight) hours as needed for nausea or vomiting. 08/16/16  Yes Rodolph Bong, MD  oxyCODONE (OXY IR/ROXICODONE) 5 MG immediate release tablet Take 1-2 tablets (5-10 mg total) by mouth every 4 (four) hours as needed for moderate pain. 08/16/16  Yes Rodolph Bong, MD  pantoprazole (PROTONIX) 40 MG tablet Take 1 tablet (40 mg total) by mouth daily. 08/16/16  Yes Rodolph Bong, MD  Pseudoephedrine-APAP-DM (DAYQUIL PO) Take 2 capsules by mouth as needed (cough).   Yes [provider]  rivaroxaban (XARELTO) 20 MG TABS tablet Take 1 tablet (20 mg total) by mouth daily with supper. 08/16/16  Yes Janee Morn,  Lovey Newcomer, MD  vitamin B-12 (CYANOCOBALAMIN) 1000 MCG tablet Take 1 tablet (1,000 mcg total) by mouth daily. 08/16/16  Yes Rodolph Bong, MD  docusate sodium (COLACE) 100 MG capsule Take 1 capsule (100 mg total) by mouth 2 (two) times daily. Patient not  taking: Reported on 07/09/2017 08/16/16   Rodolph Bong, MD  methocarbamol (ROBAXIN) 500 MG tablet Take 1 tablet (500 mg total) by mouth 3 (three) times daily. Patient not taking: Reported on 07/09/2017 08/16/16   Rodolph Bong, MD     Vital Signs: BP (!) 131/91 (BP Location: Left Arm)   Pulse 67   Temp 98.4 F (36.9 C) (Oral)   Resp 20   Ht 5\' 6"  (1.676 m)   Wt 172 lb 13.5 oz (78.4 kg)   SpO2 100%   BMI 27.90 kg/m   Physical Exam  Constitutional: She appears well-developed and well-nourished. No distress.  Cardiovascular: Normal rate, regular rhythm and normal heart sounds.  No murmur heard. Pulmonary/Chest: Effort normal and breath sounds normal. She has no wheezes.  Neurological:  Alert, awake, and oriented x3. Speech and comprehension intact. PERRL bilaterally. EOMs intact bilaterally without nystagmus or subjective diplopia. Visual fields not assessed. Demonstrates left facial droop. Tongue midline. Motor power symmetric proportional to effort. No pronator drift. Fine motor and coordination intact and symmetric. Gait not assessed. Romberg not assessed. Heel to toe not assessed. Distal pulses 2+ bilaterally.   Skin: Skin is warm and dry.  Right groin incision soft without active bleeding or hematoma.  Psychiatric: She has a normal mood and affect. Her behavior is normal. Judgment and thought content normal.  Nursing note and vitals reviewed.   Imaging: Ct Angio Head W Or Wo Contrast  Result Date: 07/08/2017 CLINICAL DATA:  47 y/o  F; stroke patient with follow-up scan. EXAM: CT ANGIOGRAPHY HEAD AND NECK TECHNIQUE: Multidetector CT imaging of the head and neck was performed using the standard protocol during bolus administration of intravenous contrast. Multiplanar CT image reconstructions and MIPs were obtained to evaluate the vascular anatomy. Carotid stenosis measurements (when applicable) are obtained utilizing NASCET criteria, using the distal internal  carotid diameter as the denominator. CONTRAST:  50mL ISOVUE-370 IOPAMIDOL (ISOVUE-370) INJECTION 76% COMPARISON:  07/08/2017 CT head. 02/19/2014 CT angiogram head and neck. FINDINGS: CTA NECK FINDINGS Aortic arch: Standard branching. Imaged portion shows no evidence of aneurysm or dissection. No significant stenosis of the major arch vessel origins. Right carotid system: No evidence of dissection, stenosis (50% or greater) or occlusion. Left carotid system: No evidence of dissection, stenosis (50% or greater) or occlusion. Vertebral arteries: Codominant. No evidence of dissection, stenosis (50% or greater) or occlusion. Skeleton: Negative. Other neck: Negative. Upper chest: Negative. Review of the MIP images confirms the above findings CTA HEAD FINDINGS Anterior circulation: Right M2 superior division proximal occlusion. Poor downstream collateralization. Otherwise no significant stenosis, proximal occlusion, aneurysm, or vascular malformation. Posterior circulation: No significant stenosis, proximal occlusion, aneurysm, or vascular malformation. Venous sinuses: As permitted by contrast timing, patent. Anatomic variants: Small anterior communicating artery. No posterior communicating artery identified, likely hypoplastic or absent. Review of the MIP images confirms the above findings IMPRESSION: 1. Patent carotid and vertebral arteries. No dissection, aneurysm, or hemodynamically significant stenosis utilizing NASCET criteria. 2. Right M2 superior division proximal occlusion. 3. Otherwise patent anterior and posterior intracranial circulation. No additional large vessel occlusion, aneurysm, or significant stenosis. Electronically Signed   By: Mitzi Hansen M.D.   On: 07/08/2017 19:58   Dg Chest  1 View  Result Date: 07/08/2017 CLINICAL DATA:  Code stroke. EXAM: CHEST  1 VIEW COMPARISON:  Chest radiograph Aug 07, 2016 FINDINGS: Cardiomediastinal silhouette is normal. No pleural effusions or focal  consolidations. Trachea projects midline and there is no pneumothorax. Soft tissue planes and included osseous structures are non-suspicious. IMPRESSION: Negative. Electronically Signed   By: Awilda Metro M.D.   On: 07/08/2017 23:04   Ct Head Wo Contrast  Result Date: 07/08/2017 CLINICAL DATA:  47 y/o  F; right M2 occlusion for follow-up. EXAM: CT HEAD WITHOUT CONTRAST TECHNIQUE: Contiguous axial images were obtained from the base of the skull through the vertex without intravenous contrast. COMPARISON:  07/08/2017 CT and CTA of the head. FINDINGS: Brain: Developing loss of gray-Kleckley differentiation within the right anterolateral frontal lobe, frontal operculum, and insula. No acute hemorrhage. No significant mass effect. No new area of stroke, hemorrhage, or mass effect identified in the brain. No extra-axial collection, hydrocephalus, or effacement of basilar cisterns. Vascular: Calcific atherosclerosis of carotid siphons. Skull: Normal. Negative for fracture or focal lesion. Sinuses/Orbits: Mild paranasal sinus mucosal thickening. Trace opacification within the left mastoid tip. Orbits are unremarkable. Other: None. IMPRESSION: Developing loss of gray-Patient differentiation in the right anterolateral frontal lobe, frontal operculum, and insula. ASPECTS is 7 at this time. No mass effect or hemorrhage. Electronically Signed   By: Mitzi Hansen M.D.   On: 07/08/2017 23:03   Ct Angio Neck W Or Wo Contrast  Result Date: 07/08/2017 CLINICAL DATA:  47 y/o  F; stroke patient with follow-up scan. EXAM: CT ANGIOGRAPHY HEAD AND NECK TECHNIQUE: Multidetector CT imaging of the head and neck was performed using the standard protocol during bolus administration of intravenous contrast. Multiplanar CT image reconstructions and MIPs were obtained to evaluate the vascular anatomy. Carotid stenosis measurements (when applicable) are obtained utilizing NASCET criteria, using the distal internal carotid  diameter as the denominator. CONTRAST:  50mL ISOVUE-370 IOPAMIDOL (ISOVUE-370) INJECTION 76% COMPARISON:  07/08/2017 CT head. 02/19/2014 CT angiogram head and neck. FINDINGS: CTA NECK FINDINGS Aortic arch: Standard branching. Imaged portion shows no evidence of aneurysm or dissection. No significant stenosis of the major arch vessel origins. Right carotid system: No evidence of dissection, stenosis (50% or greater) or occlusion. Left carotid system: No evidence of dissection, stenosis (50% or greater) or occlusion. Vertebral arteries: Codominant. No evidence of dissection, stenosis (50% or greater) or occlusion. Skeleton: Negative. Other neck: Negative. Upper chest: Negative. Review of the MIP images confirms the above findings CTA HEAD FINDINGS Anterior circulation: Right M2 superior division proximal occlusion. Poor downstream collateralization. Otherwise no significant stenosis, proximal occlusion, aneurysm, or vascular malformation. Posterior circulation: No significant stenosis, proximal occlusion, aneurysm, or vascular malformation. Venous sinuses: As permitted by contrast timing, patent. Anatomic variants: Small anterior communicating artery. No posterior communicating artery identified, likely hypoplastic or absent. Review of the MIP images confirms the above findings IMPRESSION: 1. Patent carotid and vertebral arteries. No dissection, aneurysm, or hemodynamically significant stenosis utilizing NASCET criteria. 2. Right M2 superior division proximal occlusion. 3. Otherwise patent anterior and posterior intracranial circulation. No additional large vessel occlusion, aneurysm, or significant stenosis. Electronically Signed   By: Mitzi Hansen M.D.   On: 07/08/2017 19:58   Mr Brain Wo Contrast  Result Date: 07/09/2017 CLINICAL DATA:  47 y/o  F; stroke for follow-up. EXAM: MRI HEAD WITHOUT CONTRAST TECHNIQUE: Multiplanar, multiecho pulse sequences of the brain and surrounding structures were  obtained without intravenous contrast. COMPARISON:  07/08/2017 CT head.  02/20/2014 MRI head.  FINDINGS: Brain: Right lateral frontal lobe and insula region of reduced diffusion measuring 4.0 x 3.2 x 4.8 cm (volume = 32 cm^3) compatible with acute/early subacute infarction similar in distribution to prior CT of head given differences in technique. No associated hemorrhage. No mass effect. No additional evidence for acute infarction, hemorrhage, or mass effect. No extra-axial collection, hydrocephalus, or effacement of basilar cisterns. Vascular: Persistent central flow voids. Skull and upper cervical spine: Normal marrow signal. Sinuses/Orbits: Diffuse paranasal sinus mucosal thickening greatest in left frontal and sphenoid sinuses. No significant abnormal signal of mastoid air cells. Orbits are unremarkable. Other: None. IMPRESSION: 1. Right lateral frontal lobe and insula acute/early subacute infarction, approximately 32 cc, similar in distribution to prior CT of head given differences in technique. No hemorrhage or mass effect. 2. Otherwise unremarkable MRI of the brain. Electronically Signed   By: Mitzi Hansen M.D.   On: 07/09/2017 17:01   Ct Head Code Stroke Wo Contrast  Result Date: 07/08/2017 CLINICAL DATA:  Code stroke. Initial evaluation for acute left arm weakness. EXAM: CT HEAD WITHOUT CONTRAST TECHNIQUE: Contiguous axial images were obtained from the base of the skull through the vertex without intravenous contrast. COMPARISON:  Prior CT from 02/19/2016. FINDINGS: Brain: Age-related cerebral atrophy. Small remote left PCA territory infarct. No acute intracranial hemorrhage. No acute large vessel territory infarct. No mass lesion, midline shift or mass effect. No hydrocephalus. No extra-axial fluid collection. Vascular: No hyperdense vessel. Skull: Scalp soft tissues and calvarium within normal limits. Sinuses/Orbits: Globes and orbital soft tissues normal. Scattered mucosal thickening  within the ethmoidal air cells, maxillary sinuses, and sphenoid sinuses. No mastoid effusion. Other: 7 ASPECTS (Alberta Stroke Program Early CT Score) - Ganglionic level infarction (caudate, lentiform nuclei, internal capsule, insula, M1-M3 cortex): 7 - Supraganglionic infarction (M4-M6 cortex): 3 Total score (0-10 with 10 being normal): 10 IMPRESSION: 1. No acute intracranial infarct or other process identified. 2. ASPECTS is 10. 3. Remote left PCA territory infarct. These results were communicated to Dr. Laurence Slate at 7:35 pmon 4/24/2019by text page via the [facility] messaging system. Electronically Signed   By: Rise Mu M.D.   On: 07/08/2017 19:40    Labs:  CBC: Recent Labs    06/23/17 1033 07/08/17 1905 07/08/17 1914 07/09/17 0527 07/10/17 0417  WBC 12.6* 8.2  --  5.5 5.4  HGB 12.8 11.7* 13.3 10.5* 11.1*  HCT 39.5 36.7 39.0 32.4* 35.0*  PLT 184 338  --  296 287    COAGS: Recent Labs    07/08/17 1905  INR 0.94  APTT 28    BMP: Recent Labs    06/23/17 1033 07/08/17 1905 07/08/17 1914 07/09/17 0527 07/10/17 0417  NA 133* 138 140 138 139  K 3.8 3.4* 3.3* 3.8 3.6  CL 102 106 104 108 103  CO2 20* 25  --  22 23  GLUCOSE 102* 193* 186* 100* 100*  BUN <5* 6 5* 5* 5*  CALCIUM 9.2 9.2  --  8.5* 8.8*  CREATININE 0.60 0.76 0.70 0.61 0.72  GFRNONAA >60 >60  --  >60 >60  GFRAA >60 >60  --  >60 >60    LIVER FUNCTION TESTS: Recent Labs    08/14/16 0428 08/15/16 0619 06/23/17 1033 07/08/17 1905  BILITOT 0.5 0.6 1.0 0.7  AST 22 27 37 25  ALT 14 20 29 16   ALKPHOS 71 77 117 84  PROT 5.6* 6.6 6.9 6.9  ALBUMIN 3.0* 3.3* 3.5 3.5    Assessment and Plan:  Left facial  droop. Left-sided weakness. Condition stable- still demonstrates left facial droop. Right groin incision stable. Continue taking Plavix 75 mg once daily. Plan for discharge home. Appreciate and agree with neurology management.  Electronically Signed: Elwin Mocha, PA-C 07/10/2017, 9:24 AM   I  spent a total of 15 Minutes at the the patient's bedside AND on the patient's hospital floor or unit, greater than 50% of which was counseling/coordinating care for left facial droop and left-sided weakness.

## 2017-07-10 NOTE — Progress Notes (Addendum)
STROKE TEAM PROGRESS NOTE   SUBJECTIVE (INTERVAL HISTORY) Neuro exam stable, weakness much improved. Today she c/o headache and doesn't want to d/c home at this time. I have offered HH services and d/w her mom at bedside as well. Will d/c tomorrow  OBJECTIVE Temp:  [98 F (36.7 C)-99.5 F (37.5 C)] 98.5 F (36.9 C) (04/26 1417) Pulse Rate:  [56-67] 67 (04/26 1417) Cardiac Rhythm: Sinus bradycardia (04/26 0701) Resp:  [17-20] 20 (04/26 1417) BP: (107-146)/(75-97) 128/85 (04/26 1417) SpO2:  [95 %-100 %] 98 % (04/26 1417)  CBC:  Recent Labs  Lab 07/08/17 1905  07/09/17 0527 07/10/17 0417  WBC 8.2  --  5.5 5.4  NEUTROABS 3.3  --   --   --   HGB 11.7*   < > 10.5* 11.1*  HCT 36.7   < > 32.4* 35.0*  MCV 90.2  --  89.3 90.0  PLT 338  --  296 287   < > = values in this interval not displayed.    Basic Metabolic Panel:  Recent Labs  Lab 07/09/17 0527 07/10/17 0417  NA 138 139  K 3.8 3.6  CL 108 103  CO2 22 23  GLUCOSE 100* 100*  BUN 5* 5*  CREATININE 0.61 0.72  CALCIUM 8.5* 8.8*    Lipid Panel:     Component Value Date/Time   CHOL 147 07/09/2017 0527   TRIG 125 07/09/2017 0527   HDL 37 (L) 07/09/2017 0527   CHOLHDL 4.0 07/09/2017 0527   VLDL 25 07/09/2017 0527   LDLCALC 85 07/09/2017 0527   HgbA1c:  Lab Results  Component Value Date   HGBA1C 4.9 07/09/2017   Urine Drug Screen:     Component Value Date/Time   LABOPIA NONE DETECTED 07/09/2017 0016   COCAINSCRNUR NONE DETECTED 07/09/2017 0016   LABBENZ POSITIVE (A) 07/09/2017 0016   AMPHETMU NONE DETECTED 07/09/2017 0016   THCU NONE DETECTED 07/09/2017 0016   LABBARB NONE DETECTED 07/09/2017 0016    Alcohol Level     Component Value Date/Time   ETH <10 07/08/2017 1905    IMAGING I have personally reviewed the radiological images below and agree with the radiology interpretations.  Ct Angio Head W Or Wo Contrast  Result Date: 07/08/2017 CLINICAL DATA:  47 y/o  F; stroke patient with follow-up scan.  EXAM: CT ANGIOGRAPHY HEAD AND NECK TECHNIQUE: Multidetector CT imaging of the head and neck was performed using the standard protocol during bolus administration of intravenous contrast. Multiplanar CT image reconstructions and MIPs were obtained to evaluate the vascular anatomy. Carotid stenosis measurements (when applicable) are obtained utilizing NASCET criteria, using the distal internal carotid diameter as the denominator. CONTRAST:  50mL ISOVUE-370 IOPAMIDOL (ISOVUE-370) INJECTION 76% COMPARISON:  07/08/2017 CT head. 02/19/2014 CT angiogram head and neck. FINDINGS: CTA NECK FINDINGS Aortic arch: Standard branching. Imaged portion shows no evidence of aneurysm or dissection. No significant stenosis of the major arch vessel origins. Right carotid system: No evidence of dissection, stenosis (50% or greater) or occlusion. Left carotid system: No evidence of dissection, stenosis (50% or greater) or occlusion. Vertebral arteries: Codominant. No evidence of dissection, stenosis (50% or greater) or occlusion. Skeleton: Negative. Other neck: Negative. Upper chest: Negative. Review of the MIP images confirms the above findings CTA HEAD FINDINGS Anterior circulation: Right M2 superior division proximal occlusion. Poor downstream collateralization. Otherwise no significant stenosis, proximal occlusion, aneurysm, or vascular malformation. Posterior circulation: No significant stenosis, proximal occlusion, aneurysm, or vascular malformation. Venous sinuses: As permitted by contrast  timing, patent. Anatomic variants: Small anterior communicating artery. No posterior communicating artery identified, likely hypoplastic or absent. Review of the MIP images confirms the above findings IMPRESSION: 1. Patent carotid and vertebral arteries. No dissection, aneurysm, or hemodynamically significant stenosis utilizing NASCET criteria. 2. Right M2 superior division proximal occlusion. 3. Otherwise patent anterior and posterior  intracranial circulation. No additional large vessel occlusion, aneurysm, or significant stenosis. Electronically Signed   By: Mitzi Hansen M.D.   On: 07/08/2017 19:58   Dg Chest 1 View  Result Date: 07/08/2017 CLINICAL DATA:  Code stroke. EXAM: CHEST  1 VIEW COMPARISON:  Chest radiograph Aug 07, 2016 FINDINGS: Cardiomediastinal silhouette is normal. No pleural effusions or focal consolidations. Trachea projects midline and there is no pneumothorax. Soft tissue planes and included osseous structures are non-suspicious. IMPRESSION: Negative. Electronically Signed   By: Awilda Metro M.D.   On: 07/08/2017 23:04   Ct Head Wo Contrast  Result Date: 07/08/2017 CLINICAL DATA:  47 y/o  F; right M2 occlusion for follow-up. EXAM: CT HEAD WITHOUT CONTRAST TECHNIQUE: Contiguous axial images were obtained from the base of the skull through the vertex without intravenous contrast. COMPARISON:  07/08/2017 CT and CTA of the head. FINDINGS: Brain: Developing loss of gray-Cottingham differentiation within the right anterolateral frontal lobe, frontal operculum, and insula. No acute hemorrhage. No significant mass effect. No new area of stroke, hemorrhage, or mass effect identified in the brain. No extra-axial collection, hydrocephalus, or effacement of basilar cisterns. Vascular: Calcific atherosclerosis of carotid siphons. Skull: Normal. Negative for fracture or focal lesion. Sinuses/Orbits: Mild paranasal sinus mucosal thickening. Trace opacification within the left mastoid tip. Orbits are unremarkable. Other: None. IMPRESSION: Developing loss of gray-Husted differentiation in the right anterolateral frontal lobe, frontal operculum, and insula. ASPECTS is 7 at this time. No mass effect or hemorrhage. Electronically Signed   By: Mitzi Hansen M.D.   On: 07/08/2017 23:03   Ct Angio Neck W Or Wo Contrast  Result Date: 07/08/2017 CLINICAL DATA:  47 y/o  F; stroke patient with follow-up scan. EXAM: CT  ANGIOGRAPHY HEAD AND NECK TECHNIQUE: Multidetector CT imaging of the head and neck was performed using the standard protocol during bolus administration of intravenous contrast. Multiplanar CT image reconstructions and MIPs were obtained to evaluate the vascular anatomy. Carotid stenosis measurements (when applicable) are obtained utilizing NASCET criteria, using the distal internal carotid diameter as the denominator. CONTRAST:  50mL ISOVUE-370 IOPAMIDOL (ISOVUE-370) INJECTION 76% COMPARISON:  07/08/2017 CT head. 02/19/2014 CT angiogram head and neck. FINDINGS: CTA NECK FINDINGS Aortic arch: Standard branching. Imaged portion shows no evidence of aneurysm or dissection. No significant stenosis of the major arch vessel origins. Right carotid system: No evidence of dissection, stenosis (50% or greater) or occlusion. Left carotid system: No evidence of dissection, stenosis (50% or greater) or occlusion. Vertebral arteries: Codominant. No evidence of dissection, stenosis (50% or greater) or occlusion. Skeleton: Negative. Other neck: Negative. Upper chest: Negative. Review of the MIP images confirms the above findings CTA HEAD FINDINGS Anterior circulation: Right M2 superior division proximal occlusion. Poor downstream collateralization. Otherwise no significant stenosis, proximal occlusion, aneurysm, or vascular malformation. Posterior circulation: No significant stenosis, proximal occlusion, aneurysm, or vascular malformation. Venous sinuses: As permitted by contrast timing, patent. Anatomic variants: Small anterior communicating artery. No posterior communicating artery identified, likely hypoplastic or absent. Review of the MIP images confirms the above findings IMPRESSION: 1. Patent carotid and vertebral arteries. No dissection, aneurysm, or hemodynamically significant stenosis utilizing NASCET criteria. 2. Right M2 superior  division proximal occlusion. 3. Otherwise patent anterior and posterior intracranial  circulation. No additional large vessel occlusion, aneurysm, or significant stenosis. Electronically Signed   By: Mitzi Hansen M.D.   On: 07/08/2017 19:58   Mr Brain Wo Contrast  Result Date: 07/09/2017 CLINICAL DATA:  47 y/o  F; stroke for follow-up. EXAM: MRI HEAD WITHOUT CONTRAST TECHNIQUE: Multiplanar, multiecho pulse sequences of the brain and surrounding structures were obtained without intravenous contrast. COMPARISON:  07/08/2017 CT head.  02/20/2014 MRI head. FINDINGS: Brain: Right lateral frontal lobe and insula region of reduced diffusion measuring 4.0 x 3.2 x 4.8 cm (volume = 32 cm^3) compatible with acute/early subacute infarction similar in distribution to prior CT of head given differences in technique. No associated hemorrhage. No mass effect. No additional evidence for acute infarction, hemorrhage, or mass effect. No extra-axial collection, hydrocephalus, or effacement of basilar cisterns. Vascular: Persistent central flow voids. Skull and upper cervical spine: Normal marrow signal. Sinuses/Orbits: Diffuse paranasal sinus mucosal thickening greatest in left frontal and sphenoid sinuses. No significant abnormal signal of mastoid air cells. Orbits are unremarkable. Other: None. IMPRESSION: 1. Right lateral frontal lobe and insula acute/early subacute infarction, approximately 32 cc, similar in distribution to prior CT of head given differences in technique. No hemorrhage or mass effect. 2. Otherwise unremarkable MRI of the brain. Electronically Signed   By: Mitzi Hansen M.D.   On: 07/09/2017 17:01   Ct Head Code Stroke Wo Contrast  Result Date: 07/08/2017 CLINICAL DATA:  Code stroke. Initial evaluation for acute left arm weakness. EXAM: CT HEAD WITHOUT CONTRAST TECHNIQUE: Contiguous axial images were obtained from the base of the skull through the vertex without intravenous contrast. COMPARISON:  Prior CT from 02/19/2016. FINDINGS: Brain: Age-related cerebral atrophy.  Small remote left PCA territory infarct. No acute intracranial hemorrhage. No acute large vessel territory infarct. No mass lesion, midline shift or mass effect. No hydrocephalus. No extra-axial fluid collection. Vascular: No hyperdense vessel. Skull: Scalp soft tissues and calvarium within normal limits. Sinuses/Orbits: Globes and orbital soft tissues normal. Scattered mucosal thickening within the ethmoidal air cells, maxillary sinuses, and sphenoid sinuses. No mastoid effusion. Other: 7 ASPECTS (Alberta Stroke Program Early CT Score) - Ganglionic level infarction (caudate, lentiform nuclei, internal capsule, insula, M1-M3 cortex): 7 - Supraganglionic infarction (M4-M6 cortex): 3 Total score (0-10 with 10 being normal): 10 IMPRESSION: 1. No acute intracranial infarct or other process identified. 2. ASPECTS is 10. 3. Remote left PCA territory infarct. These results were communicated to Dr. Laurence Slate at 7:35 pmon 4/24/2019by text page via the [facility] messaging system. Electronically Signed   By: Rise Mu M.D.   On: 07/08/2017 19:40   Transthoracic Echocardiogram - pending  MRI pending    PHYSICAL EXAM  Temp:  [98 F (36.7 C)-99.5 F (37.5 C)] 98.5 F (36.9 C) (04/26 1417) Pulse Rate:  [56-67] 67 (04/26 1417) Resp:  [17-20] 20 (04/26 1417) BP: (107-146)/(75-97) 128/85 (04/26 1417) SpO2:  [95 %-100 %] 98 % (04/26 1417)  General - Well nourished, well developed, in no apparent distress.  Ophthalmologic - PERRLA, EOMI, no funduscopic exam done  Cardiovascular - Regular rate and rhythm.  Mental Status -  Level of arousal and orientation to time, place, and person were intact. Language including expression, naming, repetition, comprehension was assessed and found intact.  Cranial Nerves II - XII - II - Visual field intact OU. III, IV, VI - Extraocular movements intact, however mild right gaze preference. V - Facial sensation intact bilaterally. VII - left  facial droop. VIII - Hearing  & vestibular intact bilaterally. X - Palate elevates symmetrically. XI - Chin turning & shoulder shrug intact bilaterally. XII - Tongue protrusion intact.  Motor Strength - The patient's strength was normal in all extremities except mild LUE pronator drift. Bulk was normal and fasciculations were absent.   Motor Tone - Muscle tone was assessed at the neck and appendages and was normal.  Reflexes - The patient's reflexes were 1+ in all extremities and she had no pathological reflexes.  Sensory - Light touch, temperature/pinprick were assessed and were symmetrical.    Coordination - The patient had normal movements in the handswith no ataxia or dysmetria.  Tremor was absent.  Gait and Station - deferred   ASSESSMENT/PLAN Ms. CHALLIS WIDEMAN is a 47 y.o. 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 She did not receive IV t-PA due to Xarelto use.   Stroke: Right MCA infarct due to right M2 occlusion with partial recannulization, etiology likely due to coagulopathy versus LV thrombus with noncompliance of anticoagulation.  Resultant left facial droop  CT head old left PCA infarct, no acute abnormality  MRI head: Rt frontal lobe, insula infarct  CTA head and neck right M2 occlusion, bilateral V4 stenosis  2D Echo: Some LVH, EF 55%, akinesis of apical myocardium  LDL 85  HgbA1c - 4.9  VTE prophylaxis - SCDs Fall precautions Diet Heart Room service appropriate? Yes; Fluid consistency: Thin  clopidogrel 75 mg daily and Xarelto (rivaroxaban) daily prior to admission, now on clopidogrel 75 mg daily and Xarelto (rivaroxaban) daily  Patient counseled to be compliant with her antithrombotic medications  Ongoing aggressive stroke risk factor management  Therapy  recommendations: home w/supervision, [facility] PT/OT  Disposition:  Home with [facility] services vs. CIR  Hx of LV thrombus  TTE in 06/2013 and 03/2015 showed persistent LV thrombus  INR fluctuating on Coumadin, therefore changed to Xarelto by cardiology  Patient missed Xarelto several doses before Tuesday  Last Xarelto Tuesday night 07/07/17 (now resumed)  CAD/MI/NSTEMI  In 2000/29/2016/2017 as per record  Status post stent in the past  On Plavix and Lipitor  Followed by cardiology  History of TIA vs. Complicated migraine   02/2014 admitted for headache, blurry vision and left-sided numbness.  She was off Coumadin at that time for dental procedure.  MRI negative for acute stroke CTA head and neck negative.  A1c 5.0 LDL 80.  Her episode consider TIA versus complicated migraine.  Coumadin resumed as well as Lipitor 80.  Hypertension  Stable . Normotensive goals at this time . Long-term BP goal normotensive  Hyperlipidemia  Lipid lowering medication PTA:  lipitor 40  LDL 85, goal < 70  Current lipid lowering medication:liptor 40  Continue statin at discharge  Tobacco abuse  Current smoker  Smoking cessation counseling provided  Pt is willing to quit  Other Stroke Risk Factors  Hx stroke/TIA - CT old left PCA infarct  ??? Anti AT III deficiency - has seen Dr. Myna Hidalgo in the past who rechecked and it was negative - on Xarelto  Hx of cocaine use - current UDS neg for cocaine  Other Active Problems  Hypokalemia - supplement and resolved  Headache- supportive tx as needed   Hospital day # 3  Work up is complete  [facility] Services need to  be arranged as she can't drive and has limited access to get to out pt appts Dispo: home with supervision, HH RN, PT, OT, ST Updated RN and case mgt, pt doesn't want to leave tonight, will d/c in am  Cephus Richer, ARNP Stroke Neurology 07/10/2017 3:20 PM  ATTENDING NOTE: I reviewed above note and agree with the assessment  and plan. I have made any additions or clarifications directly to the above note. Pt was seen and examined.   Patient no acute event overnight, neuro stable.  Still has left facial droop, and right hand dexterity difficulty.  Complains of mild headache, will treat with Fioricet as needed.  On Xarelto and Plavix.  Emphasized on medication compliance.  OT today recommend CIR. pending PT evaluation tomorrow.  If patient improved to home health, will DC in the morning.  If still need inpatient rehab, will DC Monday to inpatient rehab.  Patient agrees with the plan.  Marvel Plan, MD PhD Stroke Neurology 07/10/2017 6:52 PM   To contact Stroke Continuity provider, please refer to [email or web address]. After hours, contact General Neurology

## 2017-07-10 NOTE — Care Management Note (Signed)
Case Management Note  Patient Details  Name: Lindsey [name] MRN: [id number] Date of Birth: 12/21/1970  Subjective/Objective:    Pt admitted with CVA. She is from home with family.                Action/Plan: Pt with orders for [facility] services. CM provided choice and they selected [facility]. Cory notified and they are unable to accept the referral. Pt then chose [facility]. Orders faxed to [facility]. They will need a call 4/27 to see if the are able to accept the referral.  CM following.    Expected Discharge Date:                  Expected Discharge Plan:  Home w Home Health Services  In-House Referral:     Discharge planning Services  CM Consult  Post Acute Care Choice:  Home Health Choice offered to:     DME Arranged:    DME Agency:     HH Arranged:  RN, PT, OT, Speech Therapy HH Agency:  [facility]  Status of Service:  In process, will continue to follow  If discussed at Long Length of Stay Meetings, dates discussed:    Additional Comments:  Kermit Balo, RN 07/10/2017, 5:35 PM

## 2017-07-10 NOTE — Progress Notes (Signed)
Occupational Therapy Treatment Patient Details Name: CALAYAH ALLIO MRN: [id number] DOB: 11-21-1970 Today's Date: 07/10/2017    History of present illness [name] 4/24 - Patient spontaneously recanalized during the angiogram. PMHx CVA at age 47, TIA 2010, MI 2009, antithrombin III deficiency, apical mural thrombus, CAD, hypertension, hyperlipidemia.   OT comments  Pt demonstrates significant deficits with inattention, working memory, problem solving, safety/insight/judgement, awareness of deficits and topographical orientation. When distracted, pt demonstrated LOB posteriorly when backing up to bed. When walking into bathroom, pt bumping into bathroom door with LUE and required VC to acknowledge.  Given prior level of independence and current deficits, feel pt would benefit form intensive rehab at CIR to facilitate safe DC home with family.   Follow Up Recommendations  CIR;Supervision/Assistance - 24 hour    Equipment Recommendations  Tub/shower seat    Recommendations for Other Services Rehab consult    Precautions / Restrictions Precautions Precautions: Fall Precaution Comments: left inattention       Mobility Bed Mobility Overal bed mobility: Needs Assistance Bed Mobility: Supine to Sit;Sit to Supine     Supine to sit: Supervision Sit to supine: Supervision      Transfers Overall transfer level: Needs assistance   Transfers: Sit to/from Stand Sit to Stand: Min guard              Balance Overall balance assessment: Needs assistance   Sitting balance-Leahy Scale: Good       Standing balance-Leahy Scale: Fair Standing balance comment: LOB when distracted                            ADL either performed or assessed with clinical judgement   ADL Overall ADL's : Needs assistance/impaired     Grooming: Wash/dry hands;Wash/dry face;Min guard;Standing       Lower Body Bathing: Moderate  assistance;Sit to/from stand           Toilet Transfer: Minimal assistance;Ambulation   Toileting- Clothing Manipulation and Hygiene: Minimal assistance;Sit to/from stand Toileting - Clothing Manipulation Details (indicate cue type and reason): unaware of not pulling underwear up on L hip     Functional mobility during ADLs: Minimal assistance(LOB stepping backwards when distracted) General ADL Comments: Pt frequently dropping items with L hand; unable to screw open top with L hand     Vision   Vision Assessment?: Yes Additional Comments: Decreased visual attention   Perception     Praxis      Cognition Arousal/Alertness: Awake/alert Behavior During Therapy: Flat affect Overall Cognitive Status: Impaired/Different from baseline Area of Impairment: Attention;Memory;Following commands;Safety/judgement;Awareness;Problem solving                   Current Attention Level: Sustained Memory: Decreased short-term memory Following Commands: Follows one step commands consistently Safety/Judgement: Decreased awareness of safety;Decreased awareness of deficits Awareness: Emergent Problem Solving: Slow processing;Decreased initiation;Difficulty sequencing;Requires verbal cues;Requires tactile cues General Comments: Pt perseverating on stepping over blue diamonds in floor. .Pt walked out in hall adn read room number. Less than 1 min later, pt unable to recall room number. Pt unable to find room after walking around hall. Attempted to use signs to problem solve how to find room unsuccessfully. Pt running therapistinto wall on L when not attending to L.         Exercises     Shoulder Instructions       General  Comments      Pertinent Vitals/ Pain       Pain Assessment: 0-10 Pain Score: 5  Pain Location: head Pain Descriptors / Indicators: Aching;Headache Pain Intervention(s): Limited activity within patient's tolerance  Home Living                                           Prior Functioning/Environment              Frequency  Min 3X/week        Progress Toward Goals  OT Goals(current goals can now be found in the care plan section)  Progress towards OT goals: Progressing toward goals  Acute Rehab OT Goals Patient Stated Goal: return to home and work OT Goal Formulation: With patient Time For Goal Achievement: 07/23/17 Potential to Achieve Goals: Good ADL Goals Pt Will Perform Grooming: with supervision;standing Pt Will Perform Upper Body Bathing: with supervision;sitting Pt Will Perform Lower Body Bathing: with supervision;sit to/from stand Pt Will Perform Upper Body Dressing: with supervision;sitting Pt Will Perform Lower Body Dressing: with supervision;sit to/from stand Pt Will Transfer to Toilet: with supervision;ambulating;regular height toilet;bedside commode;grab bars Pt Will Perform Toileting - Clothing Manipulation and hygiene: with supervision;sit to/from stand Additional ADL Goal #1: Pt will locate items on Lt with no more than  min cues consistently  Plan Discharge plan needs to be updated;Frequency needs to be updated    Co-evaluation                 AM-PAC PT "6 Clicks" Daily Activity     Outcome Measure   Help from another person eating meals?: A Little Help from another person taking care of personal grooming?: A Little Help from another person toileting, which includes using toliet, bedpan, or urinal?: A Lot Help from another person bathing (including washing, rinsing, drying)?: A Lot Help from another person to put on and taking off regular upper body clothing?: A Lot Help from another person to put on and taking off regular lower body clothing?: A Lot 6 Click Score: 14    End of Session Equipment Utilized During Treatment: Gait belt  OT Visit Diagnosis: Unsteadiness on feet (R26.81);Cognitive communication deficit (R41.841) Symptoms and signs involving cognitive functions: Cerebral  infarction   Activity Tolerance Patient tolerated treatment well   Patient Left in bed;with call bell/phone within reach;with bed alarm set;with family/visitor present   Nurse Communication Mobility status        Time: 1500-1530 OT Time Calculation (min): 30 min  Charges: OT General Charges $OT Visit: 1 Visit OT Treatments $Self Care/Home Management : 23-37 mins  [facility], OT/L  [phone] 07/10/2017   Fartun Paradiso,HILLARY 07/10/2017, 3:36 PM

## 2017-07-10 NOTE — Discharge Instructions (Signed)
Information on my medicine - XARELTO® (rivaroxaban) ° °This medication education was reviewed with me or my healthcare representative as part of my discharge preparation. ° °WHY WAS XARELTO® PRESCRIBED FOR YOU? °Xarelto® was prescribed to treat blood clots that may have been found in the veins of your legs (deep vein thrombosis) or in your lungs (pulmonary embolism) and to reduce the risk of them occurring again. ° °What do you need to know about Xarelto®? °Take one 20 mg tablet taken ONCE A DAY with your evening meal. ° °DO NOT stop taking Xarelto® without talking to the health care provider who prescribed the medication.  Refill your prescription for 20 mg tablets before you run out. ° °After discharge, you should have regular check-up appointments with your healthcare provider that is prescribing your Xarelto®.  In the future your dose may need to be changed if your kidney function changes by a significant amount. ° °What do you do if you miss a dose? °If you are taking Xarelto® TWICE DAILY and you miss a dose, take it as soon as you remember. You may take two 15 mg tablets (total 30 mg) at the same time then resume your regularly scheduled 15 mg twice daily the next day. ° °If you are taking Xarelto® ONCE DAILY and you miss a dose, take it as soon as you remember on the same day then continue your regularly scheduled once daily regimen the next day. Do not take two doses of Xarelto® at the same time.  ° °Important Safety Information °Xarelto® is a blood thinner medicine that can cause bleeding. You should call your healthcare provider right away if you experience any of the following: °? Bleeding from an injury or your nose that does not stop. °? Unusual colored urine (red or dark brown) or unusual colored stools (red or black). °? Unusual bruising for unknown reasons. °? A serious fall or if you hit your head (even if there is no bleeding). ° °Some medicines may interact with Xarelto® and might increase your  risk of bleeding while on Xarelto®. To help avoid this, consult your healthcare provider or pharmacist prior to using any new prescription or non-prescription medications, including herbals, vitamins, non-steroidal anti-inflammatory drugs (NSAIDs) and supplements. ° °This website has more information on Xarelto®: www.xarelto.com. ° °

## 2017-07-10 NOTE — Progress Notes (Signed)
Inpatient Rehabilitation  Per OT request, patient was screened by Fae Pippin for appropriateness for an Inpatient Acute Rehab consult.  Discussed case with Dr. Roda Shutters and at this time we will plan to determine post acute rehab needs IP Rehab versus home health on Saturday after updated PT notes.  If patient has persistent deficits that warrant an IP Rehab then please place a consult order.    Charlane Ferretti., CCC/SLP Admission Coordinator  [facility]  Cell [phone]

## 2017-07-10 NOTE — Evaluation (Signed)
Speech Language Pathology Evaluation Patient Details Name: Lindsey [name] MRN: [id number] DOB: 1971/03/03 Today's Date: 07/10/2017 Time: 4098-1191 SLP Time Calculation (min) (ACUTE ONLY): 18 min  Problem List:  Patient Active Problem List   Diagnosis Date Noted  . Stroke (cerebrum) (HCC) 07/08/2017  . Middle cerebral artery embolism, right 07/08/2017  . Vitamin B12 deficiency 08/15/2016  . Acute cholecystitis 08/14/2016  . GERD (gastroesophageal reflux disease) 04/24/2015  . Hypertensive urgency 04/23/2015  . Chest pain, unspecified   . Right leg swelling 03/07/2015  . Preventative health care 02/21/2015  . Esophageal reflux   . NSTEMI (non-ST elevated myocardial infarction) (HCC) 01/28/2015  . Nausea without vomiting 12/22/2014  . Dysuria 12/21/2014  . Diarrhea 09/26/2014  . Difficulty swallowing pills 09/26/2014  . Hemorrhoids 06/16/2014  . Elevated AST (SGOT) 03/27/2014  . Lumbar herniated disc 03/27/2014  . TIA (transient ischemic attack) 02/19/2014  . History of ischemic left PCA stroke 02/19/2014  . Palpitations 01/27/2014  . Depression 08/15/2013  . Long term current use of anticoagulant therapy 08/10/2013  . Gastritis and duodenitis 08/03/2013  . Diverticulosis 08/03/2013  . Fatty liver 08/03/2013  . Esophagitis 08/03/2013  . Hiatal hernia 08/03/2013  . Apical mural thrombus 08/02/2013  . GI bleeding 06/04/2012  . Antithrombin III deficiency (HCC) 06/04/2012  . Paresthesias 06/29/2011  . Hyperlipemia 06/07/2009  . Essential hypertension 05/17/2009  . OBESITY-MORBID (>100') 03/13/2008  . CAD S/P  BMS to proximal LAD 2009-patent 01/29/15 06/28/2007   Past Medical History:  Past Medical History:  Diagnosis Date  . Anemia   . Antithrombin III deficiency (HCC)    ?pt not sure if true diagnosis  . Anxiety   . Apical mural thrombus   . Blood transfusion   . Cholecystitis 07/2016  . Coronary artery disease    a. apical LAD infarction '00. b. NSTEMI s/p BMS to  prox LAD '09. c. Cath 01/2015: stable LAD stent, otherwise minimal nonobstructive CAD.  Marland Kitchen GERD (gastroesophageal reflux disease)   . Hyperlipidemia   . Hypertension   . Myocardial infarction (HCC)   . Noncompliance with medication regimen    a. h/o noncompliance with med regimen (previous running out of Coumadin).  . Stroke (HCC)    assoc with short term memory loss and right peripheral vision loss; age 47   . TIA (transient ischemic attack) 2010  . Tobacco abuse    Past Surgical History:  Past Surgical History:  Procedure Laterality Date  . CARDIAC CATHETERIZATION    . CARDIAC CATHETERIZATION N/A 01/29/2015   Procedure: Left Heart Cath and Coronary Angiography;  Surgeon: Peter M Swaziland, MD;  Location: [facility] INVASIVE CV LAB;  Service: Cardiovascular;  Laterality: N/A;  . CHOLECYSTECTOMY N/A 08/14/2016   Procedure: LAPAROSCOPIC CHOLECYSTECTOMY;  Surgeon: Gaynelle Adu, MD;  Location: [facility] OR;  Service: General;  Laterality: N/A;  . COLONOSCOPY N/A 03/29/2014   Procedure: COLONOSCOPY;  Surgeon: Louis Meckel, MD;  Location: [facility] ENDOSCOPY;  Service: Endoscopy;  Laterality: N/A;  . CORONARY ANGIOPLASTY    . ESOPHAGOGASTRODUODENOSCOPY N/A 06/09/2012   Procedure: ESOPHAGOGASTRODUODENOSCOPY (EGD);  Surgeon: Theda Belfast, MD;  Location: [facility] ENDOSCOPY;  Service: Endoscopy;  Laterality: N/A;  . ESOPHAGOGASTRODUODENOSCOPY N/A 08/02/2013   Procedure: ESOPHAGOGASTRODUODENOSCOPY (EGD);  Surgeon: Meryl Dare, MD;  Location: [facility] ENDOSCOPY;  Service: Endoscopy;  Laterality: N/A;  . LEFT HEART CATHETERIZATION WITH CORONARY ANGIOGRAM N/A 12/24/2011   Procedure: LEFT HEART CATHETERIZATION WITH CORONARY ANGIOGRAM;  Surgeon: Kathleene Hazel, MD;  Location: [facility] CATH LAB;  Service: Cardiovascular;  Laterality: N/A;  . RADIOLOGY WITH ANESTHESIA N/A 07/08/2017   Procedure: IR WITH ANESTHESIA;  Surgeon: Radiologist, Medication, MD;  Location: [facility] OR;  Service: Radiology;  Laterality: N/A;  . TUBAL LIGATION      HPI:  [name]. PMHx CVA at age 47, TIA 80, MI 2009, antithrombin III deficiency, apical mural thrombus, CAD, hypertension, hyperlipidemia.  MRI: Right lateral frontal lobe and insula acute/early subacute   Assessment / Plan / Recommendation Clinical Impression  Pt presents with moderate cognitive-linguistic deficits s/p right frontal CVA - demonstrates decreased working memory, impaired functional problem-solving, difficulty ceasing and shifting attention when appropriate, limited recognition/awareness of deficits beyond the most obvious physical impairments.  Pt asserts that her mother and older daughter can assist at D/C, but her mother entered room and clarified that she works and is unable to take time off at this time; 49 year old dtr also works and is in Automotive engineer.  Pt will need 24 hour supervision at this time.  Will benefit from intensive cognitive-linguistic therapy to address the above deficits.     SLP Assessment  SLP Recommendation/Assessment: Patient needs continued Speech Lanaguage Pathology Services SLP Visit Diagnosis: Cognitive communication deficit (R41.841)    Follow Up Recommendations  24 hour supervision/assistance    Frequency and Duration min 2x/week  1 week      SLP Evaluation Cognition  Overall Cognitive Status: Impaired/Different from baseline Arousal/Alertness: Awake/alert Orientation Level: Oriented X4 Attention: Selective Selective Attention: Impaired Memory: Impaired Memory Impairment: Prospective memory;Decreased recall of new information;Storage deficit;Retrieval deficit Awareness: Impaired Awareness Impairment: Intellectual impairment Problem Solving: Impaired Problem Solving Impairment: Functional basic;Verbal basic Safety/Judgment: Impaired       Comprehension  Auditory Comprehension Overall Auditory Comprehension: Appears within functional limits for tasks assessed Reading  Comprehension Reading Status: Within funtional limits    Expression Expression Primary Mode of Expression: Verbal Verbal Expression Overall Verbal Expression: Appears within functional limits for tasks assessed Written Expression Dominant Hand: Left   Oral / Motor  Oral Motor/Sensory Function Overall Oral Motor/Sensory Function: Moderate impairment Facial ROM: Reduced left Facial Symmetry: Abnormal symmetry left;Suspected CN VII (facial) dysfunction Facial Strength: Reduced left;Suspected CN VII (facial) dysfunction Facial Sensation: Suspected CN V (Trigeminal) dysfunction;Reduced left Motor Speech Overall Motor Speech: Appears within functional limits for tasks assessed   GO                    Blenda Mounts Laurice 07/10/2017, 11:17 AM

## 2017-07-11 LAB — CBC
HCT: 34.7 % — ABNORMAL LOW (ref 36.0–46.0)
Hemoglobin: 11.2 g/dL — ABNORMAL LOW (ref 12.0–15.0)
MCH: 28.9 pg (ref 26.0–34.0)
MCHC: 32.3 g/dL (ref 30.0–36.0)
MCV: 89.4 fL (ref 78.0–100.0)
PLATELETS: 275 10*3/uL (ref 150–400)
RBC: 3.88 MIL/uL (ref 3.87–5.11)
RDW: 13.4 % (ref 11.5–15.5)
WBC: 5.8 10*3/uL (ref 4.0–10.5)

## 2017-07-11 LAB — BASIC METABOLIC PANEL
ANION GAP: 9 (ref 5–15)
BUN: 6 mg/dL (ref 6–20)
CALCIUM: 9 mg/dL (ref 8.9–10.3)
CO2: 25 mmol/L (ref 22–32)
Chloride: 105 mmol/L (ref 101–111)
Creatinine, Ser: 0.63 mg/dL (ref 0.44–1.00)
GFR calc Af Amer: >60 mL/min (ref >60)
GLUCOSE: 89 mg/dL (ref 65–99)
Potassium: 3.6 mmol/L (ref 3.5–5.1)
Sodium: 139 mmol/L (ref 135–145)

## 2017-07-11 NOTE — Progress Notes (Signed)
STROKE TEAM PROGRESS NOTE   SUBJECTIVE (INTERVAL HISTORY) Neuro exam stable, weakness much improved, yet still with considerable disability. Pt and family do not feel safe to go home w/HH. CIR will now be considered for d/c plan. I have called to update case mgr, Angela. However voice mail is "full" and I cannot leave a message.  OBJECTIVE Temp:  [98.3 F (36.8 C)-98.8 F (37.1 C)] 98.8 F (37.1 C) (04/27 1308) Pulse Rate:  [54-70] 56 (04/27 1308) Cardiac Rhythm: Sinus bradycardia (04/27 0701) Resp:  [15-20] 15 (04/27 1308) BP: (121-139)/(82-97) 121/82 (04/27 1308) SpO2:  [98 %-100 %] 100 % (04/27 1308)  CBC:  Recent Labs  Lab 07/08/17 1905  07/10/17 0417 07/11/17 0558  WBC 8.2   < > 5.4 5.8  NEUTROABS 3.3  --   --   --   HGB 11.7*   < > 11.1* 11.2*  HCT 36.7   < > 35.0* 34.7*  MCV 90.2   < > 90.0 89.4  PLT 338   < > 287 275   < > = values in this interval not displayed.    Basic Metabolic Panel:  Recent Labs  Lab 07/10/17 0417 07/11/17 0558  NA 139 139  K 3.6 3.6  CL 103 105  CO2 23 25  GLUCOSE 100* 89  BUN 5* 6  CREATININE 0.72 0.63  CALCIUM 8.8* 9.0    Lipid Panel:     Component Value Date/Time   CHOL 147 07/09/2017 0527   TRIG 125 07/09/2017 0527   HDL 37 (L) 07/09/2017 0527   CHOLHDL 4.0 07/09/2017 0527   VLDL 25 07/09/2017 0527   LDLCALC 85 07/09/2017 0527   HgbA1c:  Lab Results  Component Value Date   HGBA1C 4.9 07/09/2017   Urine Drug Screen:     Component Value Date/Time   LABOPIA NONE DETECTED 07/09/2017 0016   COCAINSCRNUR NONE DETECTED 07/09/2017 0016   LABBENZ POSITIVE (A) 07/09/2017 0016   AMPHETMU NONE DETECTED 07/09/2017 0016   THCU NONE DETECTED 07/09/2017 0016   LABBARB NONE DETECTED 07/09/2017 0016    Alcohol Level     Component Value Date/Time   ETH <10 07/08/2017 1905    IMAGING I have personally reviewed the radiological images below and agree with the radiology interpretations.  Mr Brain 64 Contrast  Result Date:  07/09/2017 CLINICAL DATA:  47 y/o  F; stroke for follow-up. EXAM: MRI HEAD WITHOUT CONTRAST TECHNIQUE: Multiplanar, multiecho pulse sequences of the brain and surrounding structures were obtained without intravenous contrast. COMPARISON:  07/08/2017 CT head.  02/20/2014 MRI head. FINDINGS: Brain: Right lateral frontal lobe and insula region of reduced diffusion measuring 4.0 x 3.2 x 4.8 cm (volume = 32 cm^3) compatible with acute/early subacute infarction similar in distribution to prior CT of head given differences in technique. No associated hemorrhage. No mass effect. No additional evidence for acute infarction, hemorrhage, or mass effect. No extra-axial collection, hydrocephalus, or effacement of basilar cisterns. Vascular: Persistent central flow voids. Skull and upper cervical spine: Normal marrow signal. Sinuses/Orbits: Diffuse paranasal sinus mucosal thickening greatest in left frontal and sphenoid sinuses. No significant abnormal signal of mastoid air cells. Orbits are unremarkable. Other: None. IMPRESSION: 1. Right lateral frontal lobe and insula acute/early subacute infarction, approximately 32 cc, similar in distribution to prior CT of head given differences in technique. No hemorrhage or mass effect. 2. Otherwise unremarkable MRI of the brain. Electronically Signed   By: Mitzi Hansen M.D.   On: 07/09/2017 17:01  PHYSICAL EXAM  Temp:  [98.3 F (36.8 C)-98.8 F (37.1 C)] 98.8 F (37.1 C) (04/27 1308) Pulse Rate:  [54-70] 56 (04/27 1308) Resp:  [15-20] 15 (04/27 1308) BP: (121-139)/(82-97) 121/82 (04/27 1308) SpO2:  [98 %-100 %] 100 % (04/27 1308)  General - Well nourished, well developed, in no apparent distress.  Ophthalmologic - PERRLA, EOMI, no funduscopic exam done  Cardiovascular - Regular rate and rhythm.  Mental Status -  A&Ox4, appropriate in conversation  Cranial Nerves II - XII - II - Visual field intact OU. III, IV, VI - Extraocular movements intact,  gaze is conjugate, but seems to prefer right upon initial exam. V - Facial sensation intact bilaterally. VII - left facial droop. VIII - Hearing & vestibular intact bilaterally. X - Palate elevates symmetrically. XI - Chin turning & shoulder shrug intact bilaterally. XII - Tongue protrusion intact.  Motor Strength - While strength is 4/5 on LUE, w/pronator drift, there is considerable fine motor difficulty. She cannot text or write. Bulk was normal and fasciculations were absent.   Motor Tone - Muscle tone was assessed at the neck and appendages and was normal.  Reflexes - The patient's reflexes were 1+ in all extremities  Sensory - Light touch, temperature/pinprick were assessed and were symmetrical.    Coordination - The patient had normal movements w/o gross ataxia    Gait and Station - deferred   ASSESSMENT/PLAN Ms. TIFFENY MINCHEW is a 47 y.o. 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 She did not receive IV t-PA due to Xarelto use. However, it was later found that she had missed doses of Xarelto  Stroke: Right MCA infarct due to right M2 occlusion with partial recannulization, etiology likely due to coagulopathy versus LV thrombus with noncompliance of anticoagulation.  Resultant left facial droop  CT head old left PCA infarct, no acute abnormality  MRI head: Rt frontal lobe, insula infarct  CTA head and neck right M2 occlusion, bilateral V4 stenosis  2D Echo: Some LVH, EF 55%, akinesis of apical myocardium  LDL 85  HgbA1c - 4.9  VTE prophylaxis - already on anticoagulation  Patient counseled to be compliant with antithrombotic and ALL medications  Ongoing aggressive stroke risk factor management  Therapy recommendations: CIR  Disposition: Stable  and ready for d/c. Awaiting case mgt to set up d/c to CIR  Hx of LV thrombus  TTE in 06/2013 and 03/2015 showed persistent LV thrombus  INR fluctuating on Coumadin, therefore changed to Xarelto by cardiology  Patient missed Xarelto several doses before admit with stroke  Last dose Xarelto Tuesday night 07/07/17 (now resumed)  CAD/MI/NSTEMI  In 2000/29/2016/2017 as per record  Status post stent in the past  On Plavix and Lipitor  Followed by cardiology  History of TIA vs. Complicated migraine   02/2014 admitted for headache, blurry vision and left-sided numbness.  She was off Coumadin at that time for dental procedure.  MRI negative for acute stroke CTA head and neck negative.  A1c 5.0 LDL 80.  Her episode consider TIA versus complicated migraine.  Coumadin resumed as well as Lipitor 80.  Hypertension  Stable . Normotensive goals at this time . Long-term BP goal normotensive  Hyperlipidemia  Lipid lowering medication PTA:  lipitor 40  LDL 85, goal < 70  Current lipid lowering medication:liptor  40  Continue statin at discharge  Tobacco abuse  Current smoker  Smoking cessation counseling provided  Pt is willing to quit  Other Stroke Risk Factors  Hx stroke/TIA - CT old left PCA infarct  ??? Anti AT III deficiency - has seen Dr. Myna Hidalgo in the past who rechecked and it was negative - on Xarelto  Hx of cocaine use - current UDS neg for cocaine  Other Active Problems  Hypokalemia - supplement and resolved  Headache- supportive tx as needed   Hospital day # 3 -Pt has indication for anticoagulation and Plavix d/t LV thrombus, MI, CAD w/stent -Out pt cardiology f/u -Stroke work up is complete  -She is medically stable and ready for d/c at this time. I have called case mgt x 2, voice mail is full -Dispo: CIR when case mgt can arrange, ASAP -I have spent in face-face time with this pt and family at bedside and in coordination of care. I have d/w Dr  Chandra Batch, attending neurologist  Gwendolyne Welford Metzger-Cihelka, ARNP Stroke Neurology 07/11/2017 1:35 PM  To contact Stroke Continuity provider, please refer to [email or web address]. After hours, contact General Neurology

## 2017-07-11 NOTE — Progress Notes (Signed)
Physical Therapy Treatment Patient Details Name: Lindsey [name] MRN: [id number] DOB: [birth year] Today's Date: 07/11/2017    History of Present Illness [name] 4/24 - Patient spontaneously recanalized during the angiogram. PMHx CVA at age 47, TIA 2010, MI 2009, antithrombin III deficiency, apical mural thrombus, CAD, hypertension, hyperlipidemia.    PT Comments    Feel pt would make an excellent candidate for CIR. She is at high risk for falls. Pt and pt's mother are very interested in possibility of CIR.    Follow Up Recommendations  CIR;Supervision for mobility/OOB     Equipment Recommendations  Rolling walker with 5" wheels    Recommendations for Other Services       Precautions / Restrictions Precautions Precautions: Fall Precaution Comments: left inattention    Mobility  Bed Mobility Overal bed mobility: Needs Assistance Bed Mobility: Supine to Sit     Supine to sit: Supervision     General bed mobility comments: required cues and increased time.   Transfers Overall transfer level: Needs assistance Equipment used: None Transfers: Sit to/from Stand Sit to Stand: Min guard         General transfer comment: cues for left hand placement  Ambulation/Gait Ambulation/Gait assistance: Min assist Ambulation Distance (Feet): 40 Feet Assistive device: Rolling walker (2 wheeled);1 person hand held assist(Pt ambulated first with hand held assist then with RW) Gait Pattern/deviations: Step-to pattern;Decreased stride length   Gait velocity interpretation: <1.8 ft/sec, indicate of risk for recurrent falls General Gait Details: right foot tends to drag without verbal cueing, very short stride lenth, some difficulty with sequencing, inattention to left side unless cued   Stairs             Wheelchair Mobility    Modified Rankin (Stroke Patients Only) Modified Rankin (Stroke Patients  Only) Pre-Morbid Rankin Score: No significant disability Modified Rankin: Moderately severe disability     Balance Overall balance assessment: Needs assistance           Standing balance-Leahy Scale: Fair Standing balance comment: LOB when distracted                             Cognition Arousal/Alertness: Awake/alert Behavior During Therapy: Flat affect Overall Cognitive Status: Impaired/Different from baseline Area of Impairment: Attention                   Current Attention Level: Sustained   Following Commands: Follows one step commands with increased time;Follows one step commands consistently Safety/Judgement: Decreased awareness of safety;Decreased awareness of deficits   Problem Solving: Slow processing;Decreased initiation;Difficulty sequencing;Requires verbal cues;Requires tactile cues        Exercises General Exercises - Lower Extremity Long Arc Quad: AROM;Both;5 reps;Seated Hip Flexion/Marching: AROM;Both;10 reps;Seated    General Comments General comments (skin integrity, edema, etc.): Pt's mother present for treatment. Bothc are very interested in pursuing CIR for continued therapies. Pt is concerned about falling at home.      Pertinent Vitals/Pain Pain Assessment: 0-10 Pain Score: 5  Pain Location: headache Pain Descriptors / Indicators: Aching;Headache Pain Intervention(s): Monitored during session;Limited activity within patient's tolerance    Home Living                      Prior Function            PT Goals (current goals can now be found in the  care plan section) Progress towards PT goals: Progressing toward goals    Frequency    Min 3X/week      PT Plan Discharge plan needs to be updated    Co-evaluation              AM-PAC PT "6 Clicks" Daily Activity  Outcome Measure  Difficulty turning over in bed (including adjusting bedclothes, sheets and blankets)?: A Little Difficulty moving from  lying on back to sitting on the side of the bed? : A Little Difficulty sitting down on and standing up from a chair with arms (e.g., wheelchair, bedside commode, etc,.)?: A Little Help needed moving to and from a bed to chair (including a wheelchair)?: A Little Help needed walking in hospital room?: A Little Help needed climbing 3-5 steps with a railing? : A Little 6 Click Score: 18    End of Session Equipment Utilized During Treatment: Gait belt Activity Tolerance: Patient tolerated treatment well Patient left: in bed;with call bell/phone within reach;with family/visitor present;with bed alarm set   PT Visit Diagnosis: Other abnormalities of gait and mobility (R26.89);Other symptoms and signs involving the nervous system (O96.295)     Time: 2841-3244 PT Time Calculation (min) (ACUTE ONLY): 37 min  Charges:  $Gait Training: 23-37 mins                    G CodesLavona Mound, [place]  [phone] 07/11/2017    Donnella Sham 07/11/2017, 11:39 AM

## 2017-07-11 NOTE — Progress Notes (Signed)
Occupational Therapy Treatment Patient Details Name: NAKEYSHA ASHLEY MRN: [id number] DOB: 1970/05/24 Today's Date: 07/11/2017    History of present illness [name] 4/24 - Patient spontaneously recanalized during the angiogram. PMHx CVA at age 47, TIA 2010, MI 2009, antithrombin III deficiency, apical mural thrombus, CAD, hypertension, hyperlipidemia.   OT comments  Pt. Making gains with skilled OT.  Instructed on incorporating functional use of both L and R hands during ADL completion.  Pt. Able to complete bed mobility, toileting, and grooming tasks with min a.  Cues for safety due to L inattention.  Pt. Very eager and motivated to regain functional use of L UE.  Remains excellent candidate for CIR level therapies to resume higher level of safety and function prior to home.   Follow Up Recommendations  CIR;Supervision/Assistance - 24 hour    Equipment Recommendations       Recommendations for Other Services Rehab consult    Precautions / Restrictions Precautions Precautions: Fall Precaution Comments: left inattention       Mobility Bed Mobility                  Transfers                      Balance Overall balance assessment: Needs assistance           Standing balance-Leahy Scale: Fair Standing balance comment: LOB when distracted                            ADL either performed or assessed with clinical judgement   ADL Overall ADL's : Needs assistance/impaired   Eating/Feeding Details (indicate cue type and reason): educated on incorporating use of L hand to open,stabalize containers to promote use (able to hold butter container with left hand and pull lid off with right)  Grooming: Wash/dry hands;Wash/dry face;Min guard;Standing Grooming Details (indicate cue type and reason): assist for sequencing, organization, problem solving (cues to turn water off after she was finished)                  Statistician: Minimal assistance;Ambulation;Regular Toilet;Grab bars   Toileting- Clothing Manipulation and Hygiene: Min guard;Sitting/lateral lean       Functional mobility during ADLs: Minimal assistance General ADL Comments: L inattention still noted during in room ambulation.  cont. cues required to scan left and place L hand on RW.       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  Making multiple comments of "I want my smile back", "I want my brain back".  Very motivated to regain previous functional and life style.  Pertinent Vitals/ Pain       Pain Location: headache Pain Descriptors / Indicators: Aching;Headache  Home Living                                          Prior Functioning/Environment              Frequency  Min 3X/week        Progress Toward Goals  OT Goals(current goals can now be found in the care plan section)  Progress towards OT goals: Progressing toward goals     Plan Discharge plan needs to be updated;Frequency needs to be updated    Co-evaluation                 AM-PAC PT "6 Clicks" Daily Activity     Outcome Measure   Help from another person eating meals?: A Little Help from another person taking care of personal grooming?: A Little Help from another person  toileting, which includes using toliet, bedpan, or urinal?: A Lot Help from another person bathing (including washing, rinsing, drying)?: A Lot Help from another person to put on and taking off regular upper body clothing?: A Lot Help from another person to put on and taking off regular lower body clothing?: A Lot 6 Click Score: 14    End of Session Equipment Utilized During Treatment: Gait belt;Rolling walker  OT Visit Diagnosis: Unsteadiness on feet (R26.81);Cognitive communication deficit (R41.841) Symptoms and signs involving cognitive functions: Cerebral infarction   Activity Tolerance Patient tolerated treatment well   Patient Left in chair   Nurse Communication          Time: 1610-9604 OT Time Calculation (min): 26 min  Charges: OT General Charges $OT Visit: 1 Visit OT Treatments $Self Care/Home Management : 23-37 mins  Robet Leu, COTA/L 07/11/2017, 8:57 AM

## 2017-07-12 LAB — CBC
HCT: 34.2 % — ABNORMAL LOW (ref 36.0–46.0)
Hemoglobin: 11 g/dL — ABNORMAL LOW (ref 12.0–15.0)
MCH: 28.9 pg (ref 26.0–34.0)
MCHC: 32.2 g/dL (ref 30.0–36.0)
MCV: 89.8 fL (ref 78.0–100.0)
Platelets: 279 10*3/uL (ref 150–400)
RBC: 3.81 MIL/uL — ABNORMAL LOW (ref 3.87–5.11)
RDW: 13.5 % (ref 11.5–15.5)
WBC: 5.5 10*3/uL (ref 4.0–10.5)

## 2017-07-12 LAB — BASIC METABOLIC PANEL
ANION GAP: 6 (ref 5–15)
BUN: 8 mg/dL (ref 6–20)
CALCIUM: 8.5 mg/dL — AB (ref 8.9–10.3)
CO2: 24 mmol/L (ref 22–32)
Chloride: 109 mmol/L (ref 101–111)
Creatinine, Ser: 0.73 mg/dL (ref 0.44–1.00)
Glucose, Bld: 93 mg/dL (ref 65–99)
Potassium: 3.7 mmol/L (ref 3.5–5.1)
SODIUM: 139 mmol/L (ref 135–145)

## 2017-07-12 NOTE — Consult Note (Signed)
Physical Medicine and Rehabilitation Consult Reason for Consult: Left-sided weakness Referring Physician: Roda Shutters   HPI: Lindsey [name] is a 47 y.o. female with prior history of stroke and Antithrombin III deficiency with hx of apical mural thrombus who presented on 4/24 with left facial droop and weakness.  Patient went for emergent diagnostic cerebral angiogram the same day by interventional radiology with recanalization of her right MCA nondominant superior division.  MRI on 4/25 revealed right lateral frontal lobe and insula acute/early subacute infarction.  Patient with ongoing deficits in spatial attention to the left as well as memory problem solving balance and left-sided weakness.  Physical medicine rehabilitation was consulted to assess patient for potential inpatient rehab needs.   Review of Systems  Constitutional: Negative for fever.  HENT: Negative for hearing loss.   Eyes: Negative.   Respiratory: Negative for cough.   Cardiovascular: Negative for chest pain.  Gastrointestinal: Negative for heartburn.  Genitourinary: Negative for dysuria.  Musculoskeletal: Negative.   Neurological: Positive for sensory change and weakness.  Psychiatric/Behavioral: Negative.    Past Medical History:  Diagnosis Date  . Anemia   . Antithrombin III deficiency (HCC)    ?pt not sure if true diagnosis  . Anxiety   . Apical mural thrombus   . Blood transfusion   . Cholecystitis 07/2016  . Coronary artery disease    a. apical LAD infarction '00. b. NSTEMI s/p BMS to prox LAD '09. c. Cath 01/2015: stable LAD stent, otherwise minimal nonobstructive CAD.  Marland Kitchen GERD (gastroesophageal reflux disease)   . Hyperlipidemia   . Hypertension   . Myocardial infarction (HCC)   . Noncompliance with medication regimen    a. h/o noncompliance with med regimen (previous running out of Coumadin).  . Stroke (HCC)    assoc with short term memory loss and right peripheral vision loss; age 63   . TIA  (transient ischemic attack) 2010  . Tobacco abuse    Past Surgical History:  Procedure Laterality Date  . CARDIAC CATHETERIZATION    . CARDIAC CATHETERIZATION N/A 01/29/2015   Procedure: Left Heart Cath and Coronary Angiography;  Surgeon: Peter M Swaziland, MD;  Location: [facility] INVASIVE CV LAB;  Service: Cardiovascular;  Laterality: N/A;  . CHOLECYSTECTOMY N/A 08/14/2016   Procedure: LAPAROSCOPIC CHOLECYSTECTOMY;  Surgeon: Gaynelle Adu, MD;  Location: [facility] OR;  Service: General;  Laterality: N/A;  . COLONOSCOPY N/A 03/29/2014   Procedure: COLONOSCOPY;  Surgeon: Louis Meckel, MD;  Location: [facility] ENDOSCOPY;  Service: Endoscopy;  Laterality: N/A;  . CORONARY ANGIOPLASTY    . ESOPHAGOGASTRODUODENOSCOPY N/A 06/09/2012   Procedure: ESOPHAGOGASTRODUODENOSCOPY (EGD);  Surgeon: Theda Belfast, MD;  Location: [facility] ENDOSCOPY;  Service: Endoscopy;  Laterality: N/A;  . ESOPHAGOGASTRODUODENOSCOPY N/A 08/02/2013   Procedure: ESOPHAGOGASTRODUODENOSCOPY (EGD);  Surgeon: Meryl Dare, MD;  Location: [facility] ENDOSCOPY;  Service: Endoscopy;  Laterality: N/A;  . LEFT HEART CATHETERIZATION WITH CORONARY ANGIOGRAM N/A 12/24/2011   Procedure: LEFT HEART CATHETERIZATION WITH CORONARY ANGIOGRAM;  Surgeon: Kathleene Hazel, MD;  Location: [facility] CATH LAB;  Service: Cardiovascular;  Laterality: N/A;  . RADIOLOGY WITH ANESTHESIA N/A 07/08/2017   Procedure: IR WITH ANESTHESIA;  Surgeon: Radiologist, Medication, MD;  Location: [facility] OR;  Service: Radiology;  Laterality: N/A;  . TUBAL LIGATION     Family History  Problem Relation Age of Onset  . Heart disease Brother        arrhythmia; died  . Breast cancer Maternal Aunt    Social History:  reports that  she has been smoking cigarettes.  She has a 0.20 pack-year smoking history. She has never used smokeless tobacco. She reports that she drinks alcohol. She reports that she has current or past drug history. Drug: Cocaine. Allergies: No Known Allergies Medications Prior to Admission    Medication Sig Dispense Refill  . acetaminophen (TYLENOL) 500 MG tablet Take 1,000 mg by mouth every 6 (six) hours as needed for headache (pain).    Marland Kitchen amLODipine (NORVASC) 10 MG tablet Take 10 mg by mouth daily.  3  . atorvastatin (LIPITOR) 40 MG tablet Take 1 tablet (40 mg total) by mouth daily. 30 tablet 2  . carvedilol (COREG) 25 MG tablet Take 1 tablet (25 mg total) by mouth 2 (two) times daily with a meal. 60 tablet 1  . clopidogrel (PLAVIX) 75 MG tablet Take 1 tablet (75 mg total) by mouth daily. 30 tablet 1  . doxycycline (VIBRAMYCIN) 100 MG capsule Take 1 capsule (100 mg total) by mouth 2 (two) times daily. 28 capsule 0  . hydrochlorothiazide (HYDRODIURIL) 25 MG tablet Take 25 mg by mouth daily.  3  . HYDROcodone-acetaminophen (NORCO/VICODIN) 5-325 MG tablet Take 1 tablet by mouth every 6 (six) hours as needed. 8 tablet 0  . hydrocortisone (ANUSOL-HC) 2.5 % rectal cream Apply rectally 2 times daily prn 28 g 0  . metroNIDAZOLE (FLAGYL) 500 MG tablet Take 1 tablet (500 mg total) by mouth 2 (two) times daily. 28 tablet 0  . nitroGLYCERIN (NITROSTAT) 0.4 MG SL tablet Place 1 tablet (0.4 mg total) under the tongue every 5 (five) minutes as needed for chest pain (up to 3 doses). 15 tablet 0  . ondansetron (ZOFRAN ODT) 8 MG disintegrating tablet Take 1 tablet (8 mg total) by mouth every 8 (eight) hours as needed for nausea or vomiting. 12 tablet 0  . oxyCODONE (OXY IR/ROXICODONE) 5 MG immediate release tablet Take 1-2 tablets (5-10 mg total) by mouth every 4 (four) hours as needed for moderate pain. 20 tablet 0  . pantoprazole (PROTONIX) 40 MG tablet Take 1 tablet (40 mg total) by mouth daily. 30 tablet 1  . Pseudoephedrine-APAP-DM (DAYQUIL PO) Take 2 capsules by mouth as needed (cough).    . rivaroxaban (XARELTO) 20 MG TABS tablet Take 1 tablet (20 mg total) by mouth daily with supper. 30 tablet 1  . vitamin B-12 (CYANOCOBALAMIN) 1000 MCG tablet Take 1 tablet (1,000 mcg total) by mouth daily. 30  tablet 2  . docusate sodium (COLACE) 100 MG capsule Take 1 capsule (100 mg total) by mouth 2 (two) times daily. (Patient not taking: Reported on 07/09/2017) 10 capsule 0  . methocarbamol (ROBAXIN) 500 MG tablet Take 1 tablet (500 mg total) by mouth 3 (three) times daily. (Patient not taking: Reported on 07/09/2017) 15 tablet 0    Home: Home Living Family/patient expects to be discharged to:: Private residence Living Arrangements: Children Available Help at Discharge: Family Type of Home: House Home Access: Stairs to enter Secretary/administrator of Steps: 2 Entrance Stairs-Rails: Right, Left Home Layout: Two level, Bed/bath upstairs Bathroom Shower/Tub: Engineer, manufacturing systems: Standard Home Equipment: None Additional Comments: Pt insists her family can provide 24 hour assist, although she states her oldest daughter is a Consulting civil engineer and working, and her mother works 7-3 daily.  Youngest daughter is 33 y.o   Lives With: Family(mother and daughters (66 and 66))  Functional History: Prior Function Level of Independence: Independent Comments: works at a [employer] as a Conservation officer, nature, lives with 13 and 26  yo kids Functional Status:  Mobility: Bed Mobility Overal bed mobility: Needs Assistance Bed Mobility: Supine to Sit Supine to sit: Supervision Sit to supine: Supervision General bed mobility comments: required cues and increased time.  Transfers Overall transfer level: Needs assistance Equipment used: None Transfers: Sit to/from Stand Sit to Stand: Min guard General transfer comment: cues for left hand placement Ambulation/Gait Ambulation/Gait assistance: Min assist Ambulation Distance (Feet): 40 Feet Assistive device: Rolling walker (2 wheeled), 1 person hand held assist(Pt ambulated first with hand held assist then with RW) Gait Pattern/deviations: Step-to pattern, Decreased stride length General Gait Details: right foot tends to drag without verbal cueing, very short  stride lenth, some difficulty with sequencing, inattention to left side unless cued Gait velocity interpretation: <1.8 ft/sec, indicate of risk for recurrent falls    ADL: ADL Overall ADL's : Needs assistance/impaired Eating/Feeding: Supervision/ safety, Sitting Eating/Feeding Details (indicate cue type and reason): educated on incorporating use of L hand to open,stabalize containers to promote use (able to hold butter container with left hand and pull lid off with right)  Grooming: Wash/dry hands, Wash/dry face, Min guard, Standing Grooming Details (indicate cue type and reason): assist for sequencing, organization, problem solving (cues to turn water off after she was finished) Upper Body Bathing: Moderate assistance, Sitting Upper Body Bathing Details (indicate cue type and reason): for thoroughness Lower Body Bathing: Moderate assistance, Sit to/from stand Lower Body Bathing Details (indicate cue type and reason): for thoroughness  Upper Body Dressing : Moderate assistance, Sitting Lower Body Dressing: Sit to/from stand, Moderate assistance Lower Body Dressing Details (indicate cue type and reason): requires assist to thread Lt LE through pant leg as well as assist pulling pants over Lt hip and with pulling socks over feet  Toilet Transfer: Minimal assistance, Ambulation, Regular Toilet, Grab bars Toileting- Clothing Manipulation and Hygiene: Min guard, Sitting/lateral lean Toileting - Clothing Manipulation Details (indicate cue type and reason): unaware of not pulling underwear up on L hip Functional mobility during ADLs: Minimal assistance General ADL Comments: L inattention still noted during in room ambulation.  cont. cues required to scan left and place L hand on RW.    Cognition: Cognition Overall Cognitive Status: Impaired/Different from baseline Arousal/Alertness: Awake/alert Orientation Level: Oriented X4 Attention: Selective Selective Attention: Impaired Memory:  Impaired Memory Impairment: Prospective memory, Decreased recall of new information, Storage deficit, Retrieval deficit Awareness: Impaired Awareness Impairment: Intellectual impairment Problem Solving: Impaired Problem Solving Impairment: Functional basic, Verbal basic Safety/Judgment: Impaired Cognition Arousal/Alertness: Awake/alert Behavior During Therapy: Flat affect Overall Cognitive Status: Impaired/Different from baseline Area of Impairment: Attention Current Attention Level: Sustained Memory: Decreased short-term memory Following Commands: Follows one step commands with increased time, Follows one step commands consistently Safety/Judgement: Decreased awareness of safety, Decreased awareness of deficits Awareness: Emergent Problem Solving: Slow processing, Decreased initiation, Difficulty sequencing, Requires verbal cues, Requires tactile cues General Comments: Pt perseverating on stepping over blue diamonds in floor. .Pt walked out in hall adn read room number. Less than 1 min later, pt unable to recall room number. Pt unable to find room after walking around hall. Attempted to use signs to problem solve how to find room unsuccessfully. Pt running therapistinto wall on L when not attending to L.   Blood pressure 109/85, pulse 70, temperature 98.7 F (37.1 C), temperature source Oral, resp. rate 16, height 5\' 6"  (1.676 m), weight 78.4 kg (172 lb 13.5 oz), SpO2 100 %. Physical Exam  Constitutional: She is oriented to person, place, and time. She appears well-developed.  No distress.  Eyes: Pupils are equal, round, and reactive to light.  Neck: No thyromegaly present.  Cardiovascular: Normal rate.  Respiratory: Effort normal.  GI: She exhibits no distension.  Neurological: She is alert and oriented to person, place, and time.  Left central 7. Decreased LT left face arm and leg. Left inattention. ? Loss of far left, peripheral vision on confrontation. Fair insight and awareness.  RUE and RLE 5/5. LUE and 4+/5. LLE 4 to 4+/5 prox to distal.   Skin: Skin is warm.  Psychiatric: She has a normal mood and affect. Her behavior is normal.    Results for orders placed or performed during the hospital encounter of 07/08/17 (from the past 24 hour(s))  CBC     Status: Abnormal   Collection Time: 07/12/17  9:12 AM  Result Value Ref Range   WBC 5.5 4.0 - 10.5 K/uL   RBC 3.81 (L) 3.87 - 5.11 MIL/uL   Hemoglobin 11.0 (L) 12.0 - 15.0 g/dL   HCT 16.1 (L) 09.6 - 04.5 %   MCV 89.8 78.0 - 100.0 fL   MCH 28.9 26.0 - 34.0 pg   MCHC 32.2 30.0 - 36.0 g/dL   RDW 40.9 81.1 - 91.4 %   Platelets 279 150 - 400 K/uL  Basic metabolic panel     Status: Abnormal   Collection Time: 07/12/17  9:12 AM  Result Value Ref Range   Sodium 139 135 - 145 mmol/L   Potassium 3.7 3.5 - 5.1 mmol/L   Chloride 109 101 - 111 mmol/L   CO2 24 22 - 32 mmol/L   Glucose, Bld 93 65 - 99 mg/dL   BUN 8 6 - 20 mg/dL   Creatinine, Ser 7.82 0.44 - 1.00 mg/dL   Calcium 8.5 (L) 8.9 - 10.3 mg/dL   GFR calc non Af Amer >60 >60 mL/min   GFR calc Af Amer >60 >60 mL/min   Anion gap 6 5 - 15   No results found.  Assessment/Plan: Diagnosis: Right MCA infarct 1. Does the need for close, 24 hr/day medical supervision in concert with the patient's rehab needs make it unreasonable for this patient to be served in a less intensive setting? Yes 2. Co-Morbidities requiring supervision/potential complications: morbid obesity, hx of CVA/TIA 3. Due to bladder management, bowel management, safety, skin/wound care, disease management, medication administration, pain management and patient education, does the patient require 24 hr/day rehab nursing? Yes 4. Does the patient require coordinated care of a physician, rehab nurse, PT (1-2 hrs/day, 5 days/week), OT (1-2 hrs/day, 5 days/week) and SLP (1-2 hrs/day, 5 days/week) to address physical and functional deficits in the context of the above medical diagnosis(es)? Yes Addressing  deficits in the following areas: balance, endurance, locomotion, strength, transferring, bowel/bladder control, bathing, dressing, feeding, grooming, toileting, cognition and psychosocial support 5. Can the patient actively participate in an intensive therapy program of at least 3 hrs of therapy per day at least 5 days per week? Yes 6. The potential for patient to make measurable gains while on inpatient rehab is excellent 7. Anticipated functional outcomes upon discharge from inpatient rehab are modified independent  with PT, modified independent with OT, modified independent with SLP. 8. Estimated rehab length of stay to reach the above functional goals is: 7-10 days 9. Anticipated D/C setting: Home 10. Anticipated post D/C treatments: HH therapy and Outpatient therapy 11. Overall Rehab/Functional Prognosis: excellent  RECOMMENDATIONS: This patient's condition is appropriate for continued rehabilitative care in the following setting: CIR Patient  has agreed to participate in recommended program. Yes Note that insurance prior authorization may be required for reimbursement for recommended care.  Comment: Rehab Admissions Coordinator to follow up.  Thanks,  Ranelle Oyster, MD, [place] [facility]  I have personally performed a face to face diagnostic evaluation of this patient. Additionally, I have reviewed and concur with the physician assistant's documentation above.    Ranelle Oyster, MD 07/12/2017

## 2017-07-12 NOTE — Progress Notes (Signed)
STROKE TEAM PROGRESS NOTE   SUBJECTIVE (INTERVAL HISTORY) Neuro exam stable. Awaiting CIR bed for d/c. No new issues.  OBJECTIVE Temp:  [97.4 F (36.3 C)-99.4 F (37.4 C)] 98.7 F (37.1 C) (04/28 1014) Pulse Rate:  [56-70] 70 (04/28 1014) Cardiac Rhythm: Normal sinus rhythm (04/28 0701) Resp:  [15-18] 16 (04/28 1014) BP: (109-126)/(55-100) 109/85 (04/28 1014) SpO2:  [97 %-100 %] 100 % (04/28 1014)  CBC:  Recent Labs  Lab 07/08/17 1905  07/11/17 0558 07/12/17 0912  WBC 8.2   < > 5.8 5.5  NEUTROABS 3.3  --   --   --   HGB 11.7*   < > 11.2* 11.0*  HCT 36.7   < > 34.7* 34.2*  MCV 90.2   < > 89.4 89.8  PLT 338   < > 275 279   < > = values in this interval not displayed.    Basic Metabolic Panel:  Recent Labs  Lab 07/11/17 0558 07/12/17 0912  NA 139 139  K 3.6 3.7  CL 105 109  CO2 25 24  GLUCOSE 89 93  BUN 6 8  CREATININE 0.63 0.73  CALCIUM 9.0 8.5*    Lipid Panel:     Component Value Date/Time   CHOL 147 07/09/2017 0527   TRIG 125 07/09/2017 0527   HDL 37 (L) 07/09/2017 0527   CHOLHDL 4.0 07/09/2017 0527   VLDL 25 07/09/2017 0527   LDLCALC 85 07/09/2017 0527   HgbA1c:  Lab Results  Component Value Date   HGBA1C 4.9 07/09/2017   Urine Drug Screen:     Component Value Date/Time   LABOPIA NONE DETECTED 07/09/2017 0016   COCAINSCRNUR NONE DETECTED 07/09/2017 0016   LABBENZ POSITIVE (A) 07/09/2017 0016   AMPHETMU NONE DETECTED 07/09/2017 0016   THCU NONE DETECTED 07/09/2017 0016   LABBARB NONE DETECTED 07/09/2017 0016    Alcohol Level     Component Value Date/Time   ETH <10 07/08/2017 1905    IMAGING I have personally reviewed the radiological images below and agree with the radiology interpretations.  No results found.   PHYSICAL EXAM  Temp:  [97.4 F (36.3 C)-99.4 F (37.4 C)] 98.7 F (37.1 C) (04/28 1014) Pulse Rate:  [56-70] 70 (04/28 1014) Resp:  [15-18] 16 (04/28 1014) BP: (109-126)/(55-100) 109/85 (04/28 1014) SpO2:  [97 %-100 %]  100 % (04/28 1014)  General - Well nourished, well developed, in no apparent distress.  Ophthalmologic - PERRLA, EOMI, no funduscopic exam done  Cardiovascular - Regular rate and rhythm.  Mental Status -  A&Ox4, appropriate in conversation  Cranial Nerves II - XII - II - Visual field intact OU. III, IV, VI - Extraocular movements intact, gaze is conjugate, but seems to prefer right upon initial eye opening V - Facial sensation intact bilaterally. VII - left facial droop. VIII - Hearing & vestibular intact bilaterally. X - Palate elevates symmetrically. XI - Chin turning & shoulder shrug intact bilaterally. XII - Tongue protrusion intact, does deviate to left  Motor Strength - While strength is 4/5 on LUE, w/pronator drift, there is considerable fine motor difficulty. She cannot text or write. Bulk was normal and fasciculations were absent.   Motor Tone - Muscle tone was assessed at the neck and appendages and was normal.  Reflexes - The patient's reflexes were 1+ in all extremities  Sensory - Light touch, temperature/pinprick were assessed and were symmetrical.    Coordination - The patient had normal movements w/o gross ataxia  Gait and Station - deferred   ASSESSMENT/PLAN Lindsey [name] is a 47 y.o. 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 She did not receive IV t-PA due to Xarelto use. However, it was later found that she had missed doses of Xarelto  Stroke: Right MCA infarct due to right M2 occlusion with partial recannulization, etiology likely due to coagulopathy versus LV thrombus with noncompliance of anticoagulation.  Resultant left facial droop  CT head old left PCA infarct, no acute abnormality  MRI head: Rt frontal lobe, insula  infarct  CTA head and neck right M2 occlusion, bilateral V4 stenosis  2D Echo: Some LVH, EF 55%, akinesis of apical myocardium  LDL 85  HgbA1c - 4.9  VTE prophylaxis - already on anticoagulation  Patient counseled to be compliant with antithrombotic and ALL medications  Ongoing aggressive stroke risk factor management  Therapy recommendations: CIR  Disposition: Stable and ready for d/c. Awaiting d/c to CIR  Hx of LV thrombus  TTE in 06/2013 and 03/2015 showed persistent LV thrombus  INR fluctuating on Coumadin, therefore changed to Xarelto by cardiology  Patient missed Xarelto several doses before admit with stroke  Last dose Xarelto Tuesday night 07/07/17 (now resumed)  CAD/MI/NSTEMI  In 2000/29/2016/2017 as per record  Status post stent in the past  On Plavix and Lipitor  Followed by cardiology  History of TIA vs. Complicated migraine   02/2014 admitted for headache, blurry vision and left-sided numbness.  She was off Coumadin at that time for dental procedure.  MRI negative for acute stroke CTA head and neck negative.  A1c 5.0 LDL 80.  Her episode consider TIA versus complicated migraine.  Coumadin resumed as well as Lipitor 80.  Hypertension  Stable . Normotensive goals at this time . Long-term BP goal normotensive  Hyperlipidemia  Lipid lowering medication PTA:  lipitor 40  LDL 85, goal < 70  Current lipid lowering medication:liptor 40  Continue statin at discharge  Tobacco abuse  Current smoker  Smoking cessation counseling provided  Pt is willing to quit  Other Stroke Risk Factors  Hx stroke/TIA - CT old left PCA infarct  ?Anti AT III deficiency - has seen Dr. Myna Hidalgo in the past who rechecked and it was negative - on Xarelto  Hx of cocaine use - current UDS neg for cocaine  Other Active Problems  Hypokalemia - supplement and resolved  Headache- supportive tx as needed   Hospital day # 3 -Pt has indication for anticoagulation and  Plavix d/t LV thrombus, MI, CAD w/stent -Out pt cardiology f/u -Stroke work up is complete, reviewed stroke risks, prevention and education, FAST etc -She is medically stable and ready for d/c at this time.  -Recommend further out pt f/u with genetics for further hypercoag wk up -Dispo: CIR Monday, when they can accept new pt -I have spent in face-face time with this pt and family at bedside and in coordination of care. I have d/w Dr Chandra Batch, attending neurologist  Circe Chilton Metzger-Cihelka, ARNP Stroke Neurology 07/12/2017 11:01 AM  To contact Stroke Continuity provider, please refer to [email or web address]. After hours, contact General Neurology

## 2017-07-13 ENCOUNTER — Inpatient Hospital Stay (HOSPITAL_COMMUNITY)
Admission: RE | Admit: 2017-07-13 | Discharge: 2017-07-21 | DRG: 057 | Disposition: A | Discharge status: Home / Self Care | Payer: Medicare Other | Source: Intra-hospital | Attending: Physical Medicine & Rehabilitation | Admitting: Physical Medicine & Rehabilitation

## 2017-07-13 ENCOUNTER — Encounter (HOSPITAL_COMMUNITY): Payer: Self-pay | Admitting: Interventional Radiology

## 2017-07-13 DIAGNOSIS — I69354 Hemiplegia and hemiparesis following cerebral infarction affecting left non-dominant side: Secondary | ICD-10-CM | POA: Diagnosis not present

## 2017-07-13 DIAGNOSIS — D62 Acute posthemorrhagic anemia: Secondary | ICD-10-CM | POA: Diagnosis present

## 2017-07-13 DIAGNOSIS — Z8249 Family history of ischemic heart disease and other diseases of the circulatory system: Secondary | ICD-10-CM | POA: Diagnosis not present

## 2017-07-13 DIAGNOSIS — F329 Major depressive disorder, single episode, unspecified: Secondary | ICD-10-CM | POA: Diagnosis present

## 2017-07-13 DIAGNOSIS — T45516D Underdosing of anticoagulants, subsequent encounter: Secondary | ICD-10-CM | POA: Diagnosis not present

## 2017-07-13 DIAGNOSIS — I69398 Other sequelae of cerebral infarction: Secondary | ICD-10-CM

## 2017-07-13 DIAGNOSIS — Z9114 Patient's other noncompliance with medication regimen: Secondary | ICD-10-CM

## 2017-07-13 DIAGNOSIS — I69322 Dysarthria following cerebral infarction: Secondary | ICD-10-CM | POA: Diagnosis not present

## 2017-07-13 DIAGNOSIS — G8114 Spastic hemiplegia affecting left nondominant side: Secondary | ICD-10-CM | POA: Diagnosis not present

## 2017-07-13 DIAGNOSIS — F17200 Nicotine dependence, unspecified, uncomplicated: Secondary | ICD-10-CM

## 2017-07-13 DIAGNOSIS — Z7901 Long term (current) use of anticoagulants: Secondary | ICD-10-CM

## 2017-07-13 DIAGNOSIS — I10 Essential (primary) hypertension: Secondary | ICD-10-CM

## 2017-07-13 DIAGNOSIS — F17210 Nicotine dependence, cigarettes, uncomplicated: Secondary | ICD-10-CM | POA: Diagnosis present

## 2017-07-13 DIAGNOSIS — K5900 Constipation, unspecified: Secondary | ICD-10-CM | POA: Diagnosis present

## 2017-07-13 DIAGNOSIS — K219 Gastro-esophageal reflux disease without esophagitis: Secondary | ICD-10-CM | POA: Diagnosis present

## 2017-07-13 DIAGNOSIS — I6601 Occlusion and stenosis of right middle cerebral artery: Secondary | ICD-10-CM

## 2017-07-13 DIAGNOSIS — F4321 Adjustment disorder with depressed mood: Secondary | ICD-10-CM | POA: Diagnosis not present

## 2017-07-13 DIAGNOSIS — Z803 Family history of malignant neoplasm of breast: Secondary | ICD-10-CM | POA: Diagnosis not present

## 2017-07-13 DIAGNOSIS — Z7902 Long term (current) use of antithrombotics/antiplatelets: Secondary | ICD-10-CM

## 2017-07-13 DIAGNOSIS — E785 Hyperlipidemia, unspecified: Secondary | ICD-10-CM | POA: Diagnosis present

## 2017-07-13 DIAGNOSIS — G441 Vascular headache, not elsewhere classified: Secondary | ICD-10-CM | POA: Diagnosis present

## 2017-07-13 DIAGNOSIS — Z955 Presence of coronary angioplasty implant and graft: Secondary | ICD-10-CM | POA: Diagnosis not present

## 2017-07-13 DIAGNOSIS — I252 Old myocardial infarction: Secondary | ICD-10-CM

## 2017-07-13 DIAGNOSIS — I513 Intracardiac thrombosis, not elsewhere classified: Secondary | ICD-10-CM

## 2017-07-13 DIAGNOSIS — I2510 Atherosclerotic heart disease of native coronary artery without angina pectoris: Secondary | ICD-10-CM | POA: Diagnosis present

## 2017-07-13 DIAGNOSIS — I639 Cerebral infarction, unspecified: Secondary | ICD-10-CM | POA: Diagnosis not present

## 2017-07-13 DIAGNOSIS — I63511 Cerebral infarction due to unspecified occlusion or stenosis of right middle cerebral artery: Secondary | ICD-10-CM | POA: Diagnosis present

## 2017-07-13 DIAGNOSIS — Z8673 Personal history of transient ischemic attack (TIA), and cerebral infarction without residual deficits: Secondary | ICD-10-CM

## 2017-07-13 DIAGNOSIS — H538 Other visual disturbances: Secondary | ICD-10-CM | POA: Diagnosis present

## 2017-07-13 LAB — CBC
HEMATOCRIT: 32.7 % — AB (ref 36.0–46.0)
HEMOGLOBIN: 10.4 g/dL — AB (ref 12.0–15.0)
MCH: 28.4 pg (ref 26.0–34.0)
MCHC: 31.8 g/dL (ref 30.0–36.0)
MCV: 89.3 fL (ref 78.0–100.0)
PLATELETS: 266 10*3/uL (ref 150–400)
RBC: 3.66 MIL/uL — AB (ref 3.87–5.11)
RDW: 13.2 % (ref 11.5–15.5)
WBC: 5.7 10*3/uL (ref 4.0–10.5)

## 2017-07-13 LAB — BASIC METABOLIC PANEL
ANION GAP: 5 (ref 5–15)
BUN: 10 mg/dL (ref 6–20)
CHLORIDE: 109 mmol/L (ref 101–111)
CO2: 24 mmol/L (ref 22–32)
Calcium: 8.5 mg/dL — ABNORMAL LOW (ref 8.9–10.3)
Creatinine, Ser: 0.74 mg/dL (ref 0.44–1.00)
GFR calc Af Amer: >60 mL/min (ref >60)
GLUCOSE: 109 mg/dL — AB (ref 65–99)
POTASSIUM: 3.7 mmol/L (ref 3.5–5.1)
Sodium: 138 mmol/L (ref 135–145)

## 2017-07-13 MED ORDER — BUTALBITAL-APAP-CAFFEINE 50-325-40 MG PO TABS: 1.0000 | ORAL_TABLET | Freq: Four times a day (QID) | ORAL | Status: DC | PRN

## 2017-07-13 MED ORDER — HYDROCORTISONE 2.5 % RE CREA: TOPICAL_CREAM | RECTAL | 0 refills | Status: DC | PRN

## 2017-07-13 MED ORDER — NITROGLYCERIN 0.4 MG SL SUBL: 0.4000 mg | SUBLINGUAL_TABLET | SUBLINGUAL | Status: DC | PRN

## 2017-07-13 MED ORDER — BUTALBITAL-APAP-CAFFEINE 50-325-40 MG PO TABS: 1.0000 | ORAL_TABLET | Freq: Four times a day (QID) | ORAL | 0 refills | Status: DC | PRN

## 2017-07-13 MED ORDER — FLEET ENEMA 7-19 GM/118ML RE ENEM: 1.0000 | ENEMA | Freq: Once | RECTAL | Status: DC | PRN

## 2017-07-13 MED ORDER — CLOPIDOGREL BISULFATE 75 MG PO TABS
75.0000 mg | ORAL_TABLET | Freq: Every day | ORAL | Status: DC
  Administered 2017-07-14 – 2017-07-21 (×8): 75 mg via ORAL
  Filled 2017-07-13 (×8): qty 1

## 2017-07-13 MED ORDER — RIVAROXABAN 20 MG PO TABS
20.0000 mg | ORAL_TABLET | Freq: Every day | ORAL | Status: DC
  Administered 2017-07-14 – 2017-07-20 (×7): 20 mg via ORAL
  Filled 2017-07-13 (×7): qty 1

## 2017-07-13 MED ORDER — ALUM & MAG HYDROXIDE-SIMETH 200-200-20 MG/5ML PO SUSP: 30.0000 mL | ORAL | Status: DC | PRN

## 2017-07-13 MED ORDER — PROCHLORPERAZINE EDISYLATE 10 MG/2ML IJ SOLN: 5.0000 mg | Freq: Four times a day (QID) | INTRAMUSCULAR | Status: DC | PRN

## 2017-07-13 MED ORDER — GUAIFENESIN-DM 100-10 MG/5ML PO SYRP
5.0000 mL | ORAL_SOLUTION | Freq: Four times a day (QID) | ORAL | Status: DC | PRN
  Administered 2017-07-16 – 2017-07-17 (×2): 10 mL via ORAL
  Filled 2017-07-13 (×2): qty 10

## 2017-07-13 MED ORDER — HYDROCORTISONE 2.5 % RE CREA: TOPICAL_CREAM | Freq: Two times a day (BID) | RECTAL | Status: DC | PRN

## 2017-07-13 MED ORDER — RIVAROXABAN 20 MG PO TABS: 20.0000 mg | ORAL_TABLET | Freq: Every day | ORAL | Status: DC

## 2017-07-13 MED ORDER — TRAZODONE HCL 50 MG PO TABS
25.0000 mg | ORAL_TABLET | Freq: Every evening | ORAL | Status: DC | PRN
  Administered 2017-07-14 – 2017-07-20 (×5): 50 mg via ORAL
  Filled 2017-07-13 (×5): qty 1

## 2017-07-13 MED ORDER — DIPHENHYDRAMINE HCL 12.5 MG/5ML PO ELIX: 12.5000 mg | ORAL_SOLUTION | Freq: Four times a day (QID) | ORAL | Status: DC | PRN

## 2017-07-13 MED ORDER — PROCHLORPERAZINE 25 MG RE SUPP: 12.5000 mg | Freq: Four times a day (QID) | RECTAL | Status: DC | PRN

## 2017-07-13 MED ORDER — ACETAMINOPHEN 325 MG PO TABS
325.0000 mg | ORAL_TABLET | ORAL | Status: DC | PRN
  Administered 2017-07-13 – 2017-07-17 (×9): 650 mg via ORAL
  Administered 2017-07-17: 325 mg via ORAL
  Administered 2017-07-18 – 2017-07-21 (×5): 650 mg via ORAL
  Filled 2017-07-13 (×15): qty 2

## 2017-07-13 MED ORDER — PROCHLORPERAZINE MALEATE 5 MG PO TABS: 5.0000 mg | ORAL_TABLET | Freq: Four times a day (QID) | ORAL | Status: DC | PRN

## 2017-07-13 MED ORDER — TOPIRAMATE 25 MG PO TABS
25.0000 mg | ORAL_TABLET | Freq: Every day | ORAL | Status: DC
  Administered 2017-07-13: 25 mg via ORAL
  Filled 2017-07-13: qty 1

## 2017-07-13 MED ORDER — POLYETHYLENE GLYCOL 3350 17 G PO PACK: 17.0000 g | PACK | Freq: Every day | ORAL | Status: DC | PRN

## 2017-07-13 MED ORDER — ATORVASTATIN CALCIUM 40 MG PO TABS
40.0000 mg | ORAL_TABLET | Freq: Every day | ORAL | Status: DC
  Administered 2017-07-14 – 2017-07-21 (×8): 40 mg via ORAL
  Filled 2017-07-13 (×8): qty 1

## 2017-07-13 MED ORDER — CARVEDILOL 25 MG PO TABS
25.0000 mg | ORAL_TABLET | Freq: Two times a day (BID) | ORAL | Status: DC
  Administered 2017-07-13 – 2017-07-21 (×16): 25 mg via ORAL
  Filled 2017-07-13 (×16): qty 1

## 2017-07-13 MED ORDER — BISACODYL 10 MG RE SUPP: 10.0000 mg | Freq: Every day | RECTAL | Status: DC | PRN

## 2017-07-13 MED ORDER — VITAMIN B-12 1000 MCG PO TABS
1000.0000 ug | ORAL_TABLET | Freq: Every day | ORAL | Status: DC
  Administered 2017-07-14 – 2017-07-21 (×8): 1000 ug via ORAL
  Filled 2017-07-13 (×8): qty 1

## 2017-07-13 MED ORDER — PANTOPRAZOLE SODIUM 40 MG PO TBEC
40.0000 mg | DELAYED_RELEASE_TABLET | Freq: Every day | ORAL | Status: DC
Start: 2017-07-14 — End: 2017-07-21
  Administered 2017-07-14 – 2017-07-21 (×8): 40 mg via ORAL
  Filled 2017-07-13 (×8): qty 1

## 2017-07-13 MED ORDER — SENNOSIDES-DOCUSATE SODIUM 8.6-50 MG PO TABS
1.0000 | ORAL_TABLET | Freq: Every evening | ORAL | Status: DC | PRN
  Administered 2017-07-14: 1 via ORAL
  Filled 2017-07-13: qty 1

## 2017-07-13 NOTE — Discharge Summary (Addendum)
Stroke Discharge Summary  Patient ID: Lindsey [name]   MRN: [id number]      DOB: 1971-03-02  Date of Admission: 07/08/2017 Date of Discharge: 07/13/2017  Attending Physician:  Garvin Fila, MD, Stroke MD Consultant(s):   Wyman Songster, MD (Interventional Neuroradiologist), Alger Simons, MD (Physical Medicine & Rehabtilitation)  Patient's PCP:  [facility]  Discharge Diagnoses:  Principal Problem:  Right MCA infarct due to right M2 occlusion with recanalizing M2 seen during angio. Etiology likely due to Xarelto related coagulopathy versus LV thrombus with noncompliance of anticoagulation.  Active Problems:   Hyperlipemia   Essential hypertension   Hypokalemia   Apical mural thrombus   Long term current use of anticoagulant therapy   History of ischemic left PCA stroke   NSTEMI (non-ST elevated myocardial infarction) (HCC)   GERD (gastroesophageal reflux disease)   Tobacco use disorder   Noncompliance with medication regimen  Past Medical History:  Diagnosis Date  . Anemia   . Antithrombin III deficiency ([place])    ?pt not sure if true diagnosis  . Anxiety   . Apical mural thrombus   . Blood transfusion   . Cholecystitis 07/2016  . Coronary artery disease    a. apical LAD infarction '00. b. NSTEMI s/p BMS to prox LAD '09. c. Cath 01/2015: stable LAD stent, otherwise minimal nonobstructive CAD.  Marland Kitchen GERD (gastroesophageal reflux disease)   . Hyperlipidemia   . Hypertension   . Myocardial infarction ([place])   . Noncompliance with medication regimen    a. h/o noncompliance with med regimen (previous running out of Coumadin).  . Stroke ([place])    assoc with short term memory loss and right peripheral vision loss; age 34   . TIA (transient ischemic attack) 2010  . Tobacco abuse    Past Surgical History:  Procedure Laterality Date  . CARDIAC CATHETERIZATION    . CARDIAC CATHETERIZATION N/A 01/29/2015   Procedure: Left Heart Cath and  Coronary Angiography;  Surgeon: Peter M Martinique, MD;  Location: [place] [facility];  Service: Cardiovascular;  Laterality: N/A;  . CHOLECYSTECTOMY N/A 08/14/2016   Procedure: LAPAROSCOPIC CHOLECYSTECTOMY;  Surgeon: Greer Pickerel, MD;  Location: [place];  Service: General;  Laterality: N/A;  . COLONOSCOPY N/A 03/29/2014   Procedure: COLONOSCOPY;  Surgeon: Inda Castle, MD;  Location: [place];  Service: Endoscopy;  Laterality: N/A;  . CORONARY ANGIOPLASTY    . ESOPHAGOGASTRODUODENOSCOPY N/A 06/09/2012   Procedure: ESOPHAGOGASTRODUODENOSCOPY (EGD);  Surgeon: Beryle Beams, MD;  Location: [facility];  Service: Endoscopy;  Laterality: N/A;  . ESOPHAGOGASTRODUODENOSCOPY N/A 08/02/2013   Procedure: ESOPHAGOGASTRODUODENOSCOPY (EGD);  Surgeon: Ladene Artist, MD;  Location: [facility] ENDOSCOPY;  Service: Endoscopy;  Laterality: N/A;  . IR ANGIO INTRA EXTRACRAN SEL COM CAROTID INNOMINATE UNI R MOD SED  07/08/2017  . LEFT HEART CATHETERIZATION WITH CORONARY ANGIOGRAM N/A 12/24/2011   Procedure: LEFT HEART CATHETERIZATION WITH CORONARY ANGIOGRAM;  Surgeon: Burnell Blanks, MD;  Location: [facility] CATH LAB;  Service: Cardiovascular;  Laterality: N/A;  . RADIOLOGY WITH ANESTHESIA N/A 07/08/2017   Procedure: IR WITH ANESTHESIA;  Surgeon: Radiologist, Medication, MD;  Location: [place];  Service: Radiology;  Laterality: N/A;  . TUBAL LIGATION      Medications to be continued on Rehab Allergies as of 07/13/2017   No Known Allergies     Medication List    STOP taking these medications   acetaminophen 500 MG tablet Commonly known as:  TYLENOL  amLODipine 10 MG tablet Commonly known as:  NORVASC   DAYQUIL PO   docusate sodium 100 MG capsule Commonly known as:  COLACE   doxycycline 100 MG capsule Commonly known as:  VIBRAMYCIN   hydrochlorothiazide 25 MG tablet Commonly known as:  HYDRODIURIL   methocarbamol 500 MG tablet Commonly known as:  ROBAXIN   metroNIDAZOLE 500 MG tablet Commonly known as:   FLAGYL   ondansetron 8 MG disintegrating tablet Commonly known as:  ZOFRAN ODT   oxyCODONE 5 MG immediate release tablet Commonly known as:  Oxy IR/ROXICODONE     TAKE these medications   atorvastatin 40 MG tablet Commonly known as:  LIPITOR Take 1 tablet (40 mg total) by mouth daily.   butalbital-acetaminophen-caffeine 50-325-40 MG tablet Commonly known as:  FIORICET, ESGIC Take 1 tablet by mouth every 6 (six) hours as needed for headache.   carvedilol 25 MG tablet Commonly known as:  COREG Take 1 tablet (25 mg total) by mouth 2 (two) times daily with a meal.   clopidogrel 75 MG tablet Commonly known as:  PLAVIX Take 1 tablet (75 mg total) by mouth daily.   HYDROcodone-acetaminophen 5-325 MG tablet Commonly known as:  NORCO/VICODIN Take 1 tablet by mouth every 6 (six) hours as needed.   hydrocortisone 2.5 % rectal cream Commonly known as:  ANUSOL-HC Place rectally as needed for hemorrhoids or anal itching. What changed:    how to take this  when to take this  reasons to take this  additional instructions   nitroGLYCERIN 0.4 MG SL tablet Commonly known as:  NITROSTAT Place 1 tablet (0.4 mg total) under the tongue every 5 (five) minutes as needed for chest pain (up to 3 doses).   pantoprazole 40 MG tablet Commonly known as:  PROTONIX Take 1 tablet (40 mg total) by mouth daily.   rivaroxaban 20 MG Tabs tablet Commonly known as:  XARELTO Take 1 tablet (20 mg total) by mouth daily with supper.   vitamin B-12 1000 MCG tablet Commonly known as:  CYANOCOBALAMIN Take 1 tablet (1,000 mcg total) by mouth daily.       LABORATORY STUDIES CBC    Component Value Date/Time   WBC 5.7 07/13/2017 0312   RBC 3.66 (L) 07/13/2017 0312   HGB 10.4 (L) 07/13/2017 0312   HCT 32.7 (L) 07/13/2017 0312   PLT 266 07/13/2017 0312   MCV 89.3 07/13/2017 0312   MCH 28.4 07/13/2017 0312   MCHC 31.8 07/13/2017 0312   RDW 13.2 07/13/2017 0312   LYMPHSABS 4.3 (H) 07/08/2017  1905   MONOABS 0.4 07/08/2017 1905   EOSABS 0.2 07/08/2017 1905   BASOSABS 0.0 07/08/2017 1905   CMP    Component Value Date/Time   NA 138 07/13/2017 0312   K 3.7 07/13/2017 0312   CL 109 07/13/2017 0312   CO2 24 07/13/2017 0312   GLUCOSE 109 (H) 07/13/2017 0312   BUN 10 07/13/2017 0312   CREATININE 0.74 07/13/2017 0312   CREATININE 0.52 09/26/2014 1607   CALCIUM 8.5 (L) 07/13/2017 0312   PROT 6.9 07/08/2017 1905   ALBUMIN 3.5 07/08/2017 1905   AST 25 07/08/2017 1905   ALT 16 07/08/2017 1905   ALKPHOS 84 07/08/2017 1905   BILITOT 0.7 07/08/2017 1905   GFRNONAA >60 07/13/2017 0312   GFRAA >60 07/13/2017 0312   COAGS Lab Results  Component Value Date   INR 0.94 07/08/2017   INR 1.08 03/10/2016   INR 1.14 04/24/2015   Lipid Panel  Component Value Date/Time   CHOL 147 07/09/2017 0527   TRIG 125 07/09/2017 0527   HDL 37 (L) 07/09/2017 0527   CHOLHDL 4.0 07/09/2017 0527   VLDL 25 07/09/2017 0527   LDLCALC 85 07/09/2017 0527   HgbA1C  Lab Results  Component Value Date   HGBA1C 4.9 07/09/2017   Urinalysis    Component Value Date/Time   COLORURINE STRAW (A) 07/09/2017 0016   APPEARANCEUR CLEAR 07/09/2017 0016   APPEARANCEUR Cloudy (A) 03/06/2015 1525   LABSPEC 1.016 07/09/2017 0016   PHURINE 7.0 07/09/2017 0016   GLUCOSEU NEGATIVE 07/09/2017 0016   HGBUR NEGATIVE 07/09/2017 0016   BILIRUBINUR NEGATIVE 07/09/2017 0016   BILIRUBINUR Small 03/06/2015 1534   BILIRUBINUR Negative 03/06/2015 1525   KETONESUR NEGATIVE 07/09/2017 0016   PROTEINUR NEGATIVE 07/09/2017 0016   UROBILINOGEN 1.0 03/06/2015 1534   UROBILINOGEN 1.0 12/20/2014 2232   NITRITE NEGATIVE 07/09/2017 0016   LEUKOCYTESUR NEGATIVE 07/09/2017 0016   LEUKOCYTESUR Negative 03/06/2015 1525   Urine Drug Screen     Component Value Date/Time   LABOPIA NONE DETECTED 07/09/2017 0016   COCAINSCRNUR NONE DETECTED 07/09/2017 0016   LABBENZ POSITIVE (A) 07/09/2017 0016   AMPHETMU NONE DETECTED  07/09/2017 0016   THCU NONE DETECTED 07/09/2017 0016   LABBARB NONE DETECTED 07/09/2017 0016    Alcohol Level    Component Value Date/Time   ETH <10 07/08/2017 1905     SIGNIFICANT DIAGNOSTIC STUDIES Ct Angio Head W Or Wo Contrast  Result Date: 07/08/2017 CLINICAL DATA:  47 y/o  F; stroke patient with follow-up scan. EXAM: CT ANGIOGRAPHY HEAD AND NECK TECHNIQUE: Multidetector CT imaging of the head and neck was performed using the standard protocol during bolus administration of intravenous contrast. Multiplanar CT image reconstructions and MIPs were obtained to evaluate the vascular anatomy. Carotid stenosis measurements (when applicable) are obtained utilizing NASCET criteria, using the distal internal carotid diameter as the denominator. CONTRAST:  43m ISOVUE-370 IOPAMIDOL (ISOVUE-370) INJECTION 76% COMPARISON:  07/08/2017 CT head. 02/19/2014 CT angiogram head and neck. FINDINGS: CTA NECK FINDINGS Aortic arch: Standard branching. Imaged portion shows no evidence of aneurysm or dissection. No significant stenosis of the major arch vessel origins. Right carotid system: No evidence of dissection, stenosis (50% or greater) or occlusion. Left carotid system: No evidence of dissection, stenosis (50% or greater) or occlusion. Vertebral arteries: Codominant. No evidence of dissection, stenosis (50% or greater) or occlusion. Skeleton: Negative. Other neck: Negative. Upper chest: Negative. Review of the MIP images confirms the above findings CTA HEAD FINDINGS Anterior circulation: Right M2 superior division proximal occlusion. Poor downstream collateralization. Otherwise no significant stenosis, proximal occlusion, aneurysm, or vascular malformation. Posterior circulation: No significant stenosis, proximal occlusion, aneurysm, or vascular malformation. Venous sinuses: As permitted by contrast timing, patent. Anatomic variants: Small anterior communicating artery. No posterior communicating artery identified,  likely hypoplastic or absent. Review of the MIP images confirms the above findings IMPRESSION: 1. Patent carotid and vertebral arteries. No dissection, aneurysm, or hemodynamically significant stenosis utilizing NASCET criteria. 2. Right M2 superior division proximal occlusion. 3. Otherwise patent anterior and posterior intracranial circulation. No additional large vessel occlusion, aneurysm, or significant stenosis. Electronically Signed   By: LKristine GarbeM.D.   On: 07/08/2017 19:58   Dg Chest 1 View  Result Date: 07/08/2017 CLINICAL DATA:  Code stroke. EXAM: CHEST  1 VIEW COMPARISON:  Chest radiograph Aug 07, 2016 FINDINGS: Cardiomediastinal silhouette is normal. No pleural effusions or focal consolidations. Trachea projects midline and there is no pneumothorax. Soft  tissue planes and included osseous structures are non-suspicious. IMPRESSION: Negative. Electronically Signed   By: Elon Alas M.D.   On: 07/08/2017 23:04   Ct Head Wo Contrast  Result Date: 07/08/2017 CLINICAL DATA:  47 y/o  F; right M2 occlusion for follow-up. EXAM: CT HEAD WITHOUT CONTRAST TECHNIQUE: Contiguous axial images were obtained from the base of the skull through the vertex without intravenous contrast. COMPARISON:  07/08/2017 CT and CTA of the head. FINDINGS: Brain: Developing loss of gray-Wacker differentiation within the right anterolateral frontal lobe, frontal operculum, and insula. No acute hemorrhage. No significant mass effect. No new area of stroke, hemorrhage, or mass effect identified in the brain. No extra-axial collection, hydrocephalus, or effacement of basilar cisterns. Vascular: Calcific atherosclerosis of carotid siphons. Skull: Normal. Negative for fracture or focal lesion. Sinuses/Orbits: Mild paranasal sinus mucosal thickening. Trace opacification within the left mastoid tip. Orbits are unremarkable. Other: None. IMPRESSION: Developing loss of gray-Hurrell differentiation in the right  anterolateral frontal lobe, frontal operculum, and insula. ASPECTS is 7 at this time. No mass effect or hemorrhage. Electronically Signed   By: Kristine Garbe M.D.   On: 07/08/2017 23:03   Ct Angio Neck W Or Wo Contrast  Result Date: 07/08/2017 CLINICAL DATA:  47 y/o  F; stroke patient with follow-up scan. EXAM: CT ANGIOGRAPHY HEAD AND NECK TECHNIQUE: Multidetector CT imaging of the head and neck was performed using the standard protocol during bolus administration of intravenous contrast. Multiplanar CT image reconstructions and MIPs were obtained to evaluate the vascular anatomy. Carotid stenosis measurements (when applicable) are obtained utilizing NASCET criteria, using the distal internal carotid diameter as the denominator. CONTRAST:  63m ISOVUE-370 IOPAMIDOL (ISOVUE-370) INJECTION 76% COMPARISON:  07/08/2017 CT head. 02/19/2014 CT angiogram head and neck. FINDINGS: CTA NECK FINDINGS Aortic arch: Standard branching. Imaged portion shows no evidence of aneurysm or dissection. No significant stenosis of the major arch vessel origins. Right carotid system: No evidence of dissection, stenosis (50% or greater) or occlusion. Left carotid system: No evidence of dissection, stenosis (50% or greater) or occlusion. Vertebral arteries: Codominant. No evidence of dissection, stenosis (50% or greater) or occlusion. Skeleton: Negative. Other neck: Negative. Upper chest: Negative. Review of the MIP images confirms the above findings CTA HEAD FINDINGS Anterior circulation: Right M2 superior division proximal occlusion. Poor downstream collateralization. Otherwise no significant stenosis, proximal occlusion, aneurysm, or vascular malformation. Posterior circulation: No significant stenosis, proximal occlusion, aneurysm, or vascular malformation. Venous sinuses: As permitted by contrast timing, patent. Anatomic variants: Small anterior communicating artery. No posterior communicating artery identified, likely  hypoplastic or absent. Review of the MIP images confirms the above findings IMPRESSION: 1. Patent carotid and vertebral arteries. No dissection, aneurysm, or hemodynamically significant stenosis utilizing NASCET criteria. 2. Right M2 superior division proximal occlusion. 3. Otherwise patent anterior and posterior intracranial circulation. No additional large vessel occlusion, aneurysm, or significant stenosis. Electronically Signed   By: LKristine GarbeM.D.   On: 07/08/2017 19:58   Mr Brain Wo Contrast  Result Date: 07/09/2017 CLINICAL DATA:  47y/o  F; stroke for follow-up. EXAM: MRI HEAD WITHOUT CONTRAST TECHNIQUE: Multiplanar, multiecho pulse sequences of the brain and surrounding structures were obtained without intravenous contrast. COMPARISON:  07/08/2017 CT head.  02/20/2014 MRI head. FINDINGS: Brain: Right lateral frontal lobe and insula region of reduced diffusion measuring 4.0 x 3.2 x 4.8 cm (volume = 32 cm^3) compatible with acute/early subacute infarction similar in distribution to prior CT of head given differences in technique. No associated hemorrhage. No  mass effect. No additional evidence for acute infarction, hemorrhage, or mass effect. No extra-axial collection, hydrocephalus, or effacement of basilar cisterns. Vascular: Persistent central flow voids. Skull and upper cervical spine: Normal marrow signal. Sinuses/Orbits: Diffuse paranasal sinus mucosal thickening greatest in left frontal and sphenoid sinuses. No significant abnormal signal of mastoid air cells. Orbits are unremarkable. Other: None. IMPRESSION: 1. Right lateral frontal lobe and insula acute/early subacute infarction, approximately 32 cc, similar in distribution to prior CT of head given differences in technique. No hemorrhage or mass effect. 2. Otherwise unremarkable MRI of the brain. Electronically Signed   By: Kristine Garbe M.D.   On: 07/09/2017 17:01   US Transvaginal Non-ob  Result Date:  06/23/2017 CLINICAL DATA:  47 year old female with right lower quadrant, right pelvic pain. Symptoms for 3 days. Prior tubal ligation. The patient reports being postmenopausal. Not on hormone replacement therapy. EXAM: TRANSABDOMINAL AND TRANSVAGINAL ULTRASOUND OF PELVIS DOPPLER ULTRASOUND OF OVARIES TECHNIQUE: Both transabdominal and transvaginal ultrasound examinations of the pelvis were performed. Transabdominal technique was performed for global imaging of the pelvis including uterus, ovaries, adnexal regions, and pelvic cul-de-sac. It was necessary to proceed with endovaginal exam following the transabdominal exam to visualize the ovaries. Color and duplex Doppler ultrasound was utilized to evaluate blood flow to the ovaries. COMPARISON:  CT Abdomen and Pelvis 08/13/2016 FINDINGS: Uterus Measurements: 10.3 by 6.2 by 6.0 centimeters. Small round 1.8 centimeter area of altered echogenicity at the uterine fundus seen on image 73 may be a small fibroid. There is a 2nd more cystic appearing area along the lower uterine segment with increased through transmission (image 61) measuring 11 millimeters. Endometrium Thickness: 13 millimeters.  No focal abnormality visualized. Right ovary Measurements: 2.7 x 1.9 x 2.3 centimeters. There is a simple appearing 1.4 centimeter cyst (image 107). Normal appearance/no adnexal mass. Left ovary Measurements: 3.0 x 1.8 x 2.4 centimeters. There is a small simple appearing 2.1 centimeter cyst (image 92). Normal appearance/no adnexal mass. Pulsed Doppler evaluation of both ovaries demonstrates normal low-resistance arterial and venous waveforms. Other findings No pelvic free fluid. A tubular hypoechoic area is identified along the right lateral aspect of the uterus near the adnexa on image 118. IMPRESSION: 1. Negative for ovarian torsion. 2. Normal appearance of both ovaries, with a small simple cyst or follicle on each side, but there is a tubular hypoechoic area along the right margin  of the uterus suspicious for Hydrosalpinx. See image 118. 3. Small 1.8 cm fibroid suspected at the uterine fundus. A smaller 1.1 cm more cystic areas seen along the lower uterine segment might be a partially cystic fibroid or atypical appearing nabothian cyst. Electronically Signed   By: Genevie Ann M.D.   On: 06/23/2017 18:10   US Pelvis Complete  Result Date: 06/23/2017 CLINICAL DATA:  47 year old female with right lower quadrant, right pelvic pain. Symptoms for 3 days. Prior tubal ligation. The patient reports being postmenopausal. Not on hormone replacement therapy. EXAM: TRANSABDOMINAL AND TRANSVAGINAL ULTRASOUND OF PELVIS DOPPLER ULTRASOUND OF OVARIES TECHNIQUE: Both transabdominal and transvaginal ultrasound examinations of the pelvis were performed. Transabdominal technique was performed for global imaging of the pelvis including uterus, ovaries, adnexal regions, and pelvic cul-de-sac. It was necessary to proceed with endovaginal exam following the transabdominal exam to visualize the ovaries. Color and duplex Doppler ultrasound was utilized to evaluate blood flow to the ovaries. COMPARISON:  CT Abdomen and Pelvis 08/13/2016 FINDINGS: Uterus Measurements: 10.3 by 6.2 by 6.0 centimeters. Small round 1.8 centimeter area of altered echogenicity  at the uterine fundus seen on image 73 may be a small fibroid. There is a 2nd more cystic appearing area along the lower uterine segment with increased through transmission (image 61) measuring 11 millimeters. Endometrium Thickness: 13 millimeters.  No focal abnormality visualized. Right ovary Measurements: 2.7 x 1.9 x 2.3 centimeters. There is a simple appearing 1.4 centimeter cyst (image 107). Normal appearance/no adnexal mass. Left ovary Measurements: 3.0 x 1.8 x 2.4 centimeters. There is a small simple appearing 2.1 centimeter cyst (image 92). Normal appearance/no adnexal mass. Pulsed Doppler evaluation of both ovaries demonstrates normal low-resistance arterial and  venous waveforms. Other findings No pelvic free fluid. A tubular hypoechoic area is identified along the right lateral aspect of the uterus near the adnexa on image 118. IMPRESSION: 1. Negative for ovarian torsion. 2. Normal appearance of both ovaries, with a small simple cyst or follicle on each side, but there is a tubular hypoechoic area along the right margin of the uterus suspicious for Hydrosalpinx. See image 118. 3. Small 1.8 cm fibroid suspected at the uterine fundus. A smaller 1.1 cm more cystic areas seen along the lower uterine segment might be a partially cystic fibroid or atypical appearing nabothian cyst. Electronically Signed   By: Genevie Ann M.D.   On: 06/23/2017 18:10   Ct Abdomen Pelvis W Contrast  Result Date: 06/23/2017 CLINICAL DATA:  Right lower quadrant pain with nausea, vomiting, and diarrhea for 2 days. EXAM: CT ABDOMEN AND PELVIS WITH CONTRAST TECHNIQUE: Multidetector CT imaging of the abdomen and pelvis was performed using the standard protocol following bolus administration of intravenous contrast. CONTRAST:  123m ISOVUE-300 IOPAMIDOL (ISOVUE-300) INJECTION 61% COMPARISON:  Pelvic ultrasound earlier today. CT abdomen and pelvis 08/13/2016. FINDINGS: Lower chest: Clear lung bases. Hepatobiliary: No focal liver abnormality is seen. Status post cholecystectomy. No biliary dilatation. Pancreas: Unremarkable. Spleen: Unremarkable. Adrenals/Urinary Tract: Unremarkable adrenal glands. No evidence of renal mass, calculi, or hydronephrosis. Unremarkable bladder. Stomach/Bowel: The stomach is within normal limits. There is no evidence of bowel obstruction or inflammation. Mild colonic diverticulosis is noted without evidence of diverticulitis. The appendix is unremarkable. Vascular/Lymphatic: Mild abdominal aortic atherosclerosis without aneurysm. No enlarged lymph nodes. Reproductive: Tubal ligation clips are noted. Detailed assessment of the uterus and ovaries is deferred to today's earlier  pelvic ultrasound. Other: No intraperitoneal free fluid. Musculoskeletal: No acute osseous abnormality or suspicious osseous lesion. IMPRESSION: 1. No acute abnormality identified in the abdomen or pelvis. Normal appendix. 2.  Aortic Atherosclerosis (ICD10-I70.0). Electronically Signed   By: ALogan BoresM.D.   On: 06/23/2017 19:28   [place]/ven Flow Abd Pelv Doppler  Result Date: 06/23/2017 CLINICAL DATA:  47year old female with right lower quadrant, right pelvic pain. Symptoms for 3 days. Prior tubal ligation. The patient reports being postmenopausal. Not on hormone replacement therapy. EXAM: TRANSABDOMINAL AND TRANSVAGINAL ULTRASOUND OF PELVIS DOPPLER ULTRASOUND OF OVARIES TECHNIQUE: Both transabdominal and transvaginal ultrasound examinations of the pelvis were performed. Transabdominal technique was performed for global imaging of the pelvis including uterus, ovaries, adnexal regions, and pelvic cul-de-sac. It was necessary to proceed with endovaginal exam following the transabdominal exam to visualize the ovaries. Color and duplex Doppler ultrasound was utilized to evaluate blood flow to the ovaries. COMPARISON:  CT Abdomen and Pelvis 08/13/2016 FINDINGS: Uterus Measurements: 10.3 by 6.2 by 6.0 centimeters. Small round 1.8 centimeter area of altered echogenicity at the uterine fundus seen on image 73 may be a small fibroid. There is a 2nd more cystic appearing area along the lower uterine  segment with increased through transmission (image 61) measuring 11 millimeters. Endometrium Thickness: 13 millimeters.  No focal abnormality visualized. Right ovary Measurements: 2.7 x 1.9 x 2.3 centimeters. There is a simple appearing 1.4 centimeter cyst (image 107). Normal appearance/no adnexal mass. Left ovary Measurements: 3.0 x 1.8 x 2.4 centimeters. There is a small simple appearing 2.1 centimeter cyst (image 92). Normal appearance/no adnexal mass. Pulsed Doppler evaluation of both ovaries demonstrates normal  low-resistance arterial and venous waveforms. Other findings No pelvic free fluid. A tubular hypoechoic area is identified along the right lateral aspect of the uterus near the adnexa on image 118. IMPRESSION: 1. Negative for ovarian torsion. 2. Normal appearance of both ovaries, with a small simple cyst or follicle on each side, but there is a tubular hypoechoic area along the right margin of the uterus suspicious for Hydrosalpinx. See image 118. 3. Small 1.8 cm fibroid suspected at the uterine fundus. A smaller 1.1 cm more cystic areas seen along the lower uterine segment might be a partially cystic fibroid or atypical appearing nabothian cyst. Electronically Signed   By: Genevie Ann M.D.   On: 06/23/2017 18:10   Ct Head Code Stroke Wo Contrast  Result Date: 07/08/2017 CLINICAL DATA:  Code stroke. Initial evaluation for acute left arm weakness. EXAM: CT HEAD WITHOUT CONTRAST TECHNIQUE: Contiguous axial images were obtained from the base of the skull through the vertex without intravenous contrast. COMPARISON:  Prior CT from 02/19/2016. FINDINGS: Brain: Age-related cerebral atrophy. Small remote left PCA territory infarct. No acute intracranial hemorrhage. No acute large vessel territory infarct. No mass lesion, midline shift or mass effect. No hydrocephalus. No extra-axial fluid collection. Vascular: No hyperdense vessel. Skull: Scalp soft tissues and calvarium within normal limits. Sinuses/Orbits: Globes and orbital soft tissues normal. Scattered mucosal thickening within the ethmoidal air cells, maxillary sinuses, and sphenoid sinuses. No mastoid effusion. Other: 7 ASPECTS ([place] Stroke Program Early CT Score) - Ganglionic level infarction (caudate, lentiform nuclei, internal capsule, insula, M1-M3 cortex): 7 - Supraganglionic infarction (M4-M6 cortex): 3 Total score (0-10 with 10 being normal): 10 IMPRESSION: 1. No acute intracranial infarct or other process identified. 2. ASPECTS is 10. 3. Remote left PCA  territory infarct. These results were communicated to Dr. Lorraine Lax at 7:35 pmon 4/24/2019by text page via the [facility] messaging system. Electronically Signed   By: Jeannine Boga M.D.   On: 07/08/2017 19:40   Ir Angio Intra Extracran Sel Com Carotid Innominate Uni R Mod Sed  Result Date: 07/13/2017 INDICATION: Acute onset of left-sided weakness, left facial droop, and slurred speech. EXAM: 1. EMERGENT LARGE VESSEL OCCLUSION THROMBOLYSIS (anterior CIRCULATION) COMPARISON:  CT angiogram of the head and neck of 07/08/2017. MEDICATIONS: No antibiotic was administered within 1 hour of the procedure. ANESTHESIA/SEDATION: Mac anesthesia as per the Department of Anesthesiology at [place]:  Isovue 300 approximately 30 cc. FLUOROSCOPY TIME:  Fluoroscopy Time: 2 minutes 30 seconds (366 mGy). COMPLICATIONS: None immediate. TECHNIQUE: Following a full explanation of the procedure along with the potential associated complications, an informed witnessed consent was obtained from the patient and her mother. The risks of intracranial hemorrhage of 10%, worsening neurological deficit, ventilator dependency, death and inability to revascularize were all reviewed in detail with the patient's mother. The patient was then put under general anesthesia by the Department of Anesthesiology at [facility]. The right groin was prepped and draped in the usual sterile fashion. Thereafter using modified Seldinger technique, transfemoral access into the right common femoral artery was obtained  without difficulty. Over a 0.035 inch guidewire a 5 French Pinnacle sheath was inserted. Through this, and also over a 0.035 inch guidewire a 5 [place] JB 1 catheter was advanced to the aortic arch region and selectively positioned in the right common carotid artery. FINDINGS: The right common carotid arteriogram demonstrates the right external carotid artery and its major branches to be widely patent. The right internal  carotid artery at the bulb to the cranial skull base has wide patency with mild tortuosity of the junction of the middle and the proximal one-thirds. More distally, the petrous, the cavernous and the supraclinoid segments demonstrate wide patency. The right middle cerebral artery is seen to opacify into the capillary and venous phases. There is occlusion of the right middle cerebral artery superior division in its mid 1/3. Extensive lateral lenticulostriates are seen to supply the subcortical basal ganglia regions. Also the delayed arterial phase demonstrates partial reconstitution of the perisylvian triangular branches from the right pericallosal artery. A focal area of hyperperfusion remains just anterior and just cranial to the head of the corpus callosum. The right anterior cerebral artery, otherwise, opacifies into the capillary and venous phases. PROCEDURE: Diagnostic catheter arteriogram. IMPRESSION: Occluded superior division of the right middle cerebral artery in its mid 1/3. Extensive collaterals from the right pericallosal and the lateral lenticulostriate branches supplying the perisylvian triangle. The findings were reviewed with the referring neurologist. Given the patient demonstrated significant clinical improvement in her speech, motor function and her facial droop, and also the fact the patient has been compliant on her Xarelto, and the potential risk of worsening neurological function with endovascular treatment, it was elected not to pursue with endovascular intra-arterial treatment at this time. This was also explained to the patient and also the patient's mother. The revascularization TICI score was a 2b. PLAN: Aggressive medical management in view of significantly improved neurologic function and for reasons discussed above. Electronically Signed   By: Luanne Bras M.D.   On: 07/09/2017 10:33    2D Echocardiogram  - Left ventricle: The cavity size was normal. Wall thickness was  increased in a pattern of mild LVH. Systolic function was normal. The estimated ejection fraction was in the range of 55% to 60%. Akinesis of the apical myocardium. Doppler parameters are consistent with abnormal left ventricular relaxation (grade 1 diastolic dysfunction). There was a medium-sized, fixed, apicalthrombus. - Left atrium: The atrium was mildly dilated.    HISTORY OF PRESENT ILLNESS AIVAH PUTMAN is an 47 y.o. female with ? AT III deficiency, apical mural thrombus, prior history of stroke, HTN, HLD, MI who presents with sudden onset left facial droop and left arm weakness at 6.30 pm presents as a stroke alert. Patient last normal 07/08/2017 at 6.30 pm having dinner until daughter noticed left facial droop. EMS was called and patient stated she had right side weakness however on exam noted to have left side weakness and left facial droop. BP was 147/91 mm HG recorded by EMS. CBG was 145. tPA was not given as pt on Xarelto with last dose yesterday. NIHSS: 5. Baseline MRS 1.  CTA showed a right M2 occlusion with poor distal flow.  Cerebral angiogram showed recanalization/partial recanalization of M2.  There was no endovascular procedure performed.  She was admitted for further stroke evaluation and treatment.   HOSPITAL COURSE Ms. KAMIA INSALACO is a 47 y.o. 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 She did not receive IV t-PA due to Xarelto use. However, it was later found that she had missed doses of Xarelto.  Angio showed a recanalizing M2, so no endovascular therapy was received.  Stroke: Right MCA infarct due to right M2 occlusion with recanalizing M2 seen during angio. Etiology likely due to Xarelto related coagulopathy versus LV thrombus with noncompliance of  anticoagulation.  Resultant left hemiparesis   CT head old left PCA infarct, no acute abnormality  MRI head: Rt frontal lobe, insula infarct  CTA head and neck right M2 occlusion, bilateral V4 stenosis  Cerebral angiogram recanalizing R M2  2D Echo: Some LVH, EF 55%, akinesis of apical myocardium  LDL 85  HgbA1c 4.9  On Xarelto and Plavix prior to admission though noncompliant.  Now on Plavix 75 mg and Xarelto 20 mg daily.  Patient counseled to be compliant with antithrombotic and ALL medications.   Ongoing aggressive stroke risk factor management  Therapy recommendations: CIR  Disposition: CIR  Hx of LV thrombus  TTE in 06/2013 and 03/2015 showed persistent LV thrombus  INR fluctuating on Coumadin, therefore changed to Xarelto by cardiology  Patient missed Xarelto several doses before admit with stroke  Last dose Xarelto Tuesday night 07/07/17 (now resumed)  Continue Xarelto long-term  CAD/MI/NSTEMI  In 2000/29/2016/2017 as per record  Status post stent in the past  On Plavix and Lipitor  Followed by cardiology  History of TIA vs. Complicated migraine   04/5425 admitted for headache, blurry vision and left-sided numbness.  She was off Coumadin at that time for dental procedure.  MRI negative for acute stroke CTA head and neck negative.  A1c 5.0 LDL 80.  Her episode consider TIA versus complicated migraine.  Coumadin resumed as well as Lipitor 80.  Hypertension  WNL on home coreg.  Home norvasc and HCTZ remain on hold  BP goal normotensive  Resume home meds if BP increases  Hyperlipidemia  Lipid lowering medication PTA:  lipitor 40, resumed in hospital,   LDL 85, goal < 70  Continue statin at discharge  Tobacco abuse  Current smoker  Smoking cessation counseling provided  Pt is willing to quit  Other Stroke Risk Factors  Hx stroke/TIA - CT old left PCA infarct. see above.   ?Anti AT III deficiency - has seen Dr. Marin Olp in the  past who rechecked and it was negative - on Xarelto  Hx of cocaine use - current UDS neg for cocaine  Other Active Problems  Hypokalemia - supplement and resolved  Headache- supportive tx as needed  DISCHARGE EXAM Blood pressure 117/86, pulse (!) 55, temperature 98.4 F (36.9 C), temperature source Oral, resp. rate 20, height _0  (1.676 m), weight 78.4 kg (172 lb 13.5 oz), SpO2 100 %. General - Well nourished, well developed, in no apparent distress. Ophthalmologic - PERRLA, EOMI Cardiovascular - Regular rate and rhythm. Mental Status - A&Ox4, appropriate in conversation  Cranial Nerves II - XII - II - Visual field intact OU. III, IV, VI - Extraocular movements intact, gaze is conjugate, but seems to prefer right upon initial eye opening V - Facial sensation intact bilaterally. VII - left facial droop. VIII - Hearing & vestibular intact bilaterally. X - Palate elevates symmetrically. XI - Chin turning & shoulder shrug intact bilaterally. XII - Tongue protrusion intact, does deviate to left  Motor Strength - While strength is 4/5 on LUE,  w/pronator drift, there is considerable fine motor difficulty. She cannot text or write. Bulk was normal and fasciculations were absent.   Motor Tone - Muscle tone was assessed at the neck and appendages and was normal. Sensory - Light touch, temperature/pinprick were assessed and were symmetrical.   Coordination - The patient had normal movements w/o gross ataxia   Gait and Station  slow shuffling gait with walker, standby assist from therapist  Discharge Diet  Heart healthy thin liquids  DISCHARGE PLAN  Disposition:  Transfer to [facility] for ongoing PT, OT and ST  clopidogrel 75 mg daily and Xarelto (rivaroxaban) daily for secondary stroke prevention.  Recommend ongoing risk factor control by Primary Care Physician at time of discharge from inpatient rehabilitation.  Follow-up [facility] in 2 weeks following discharge from rehab.  Follow-up in [facility] in 4 weeks following discharge from rehab, office to schedule an appointment.   45 minutes were spent preparing discharge.  Burnetta Sabin, MSN, APRN, ANVP-BC, AGPCNP-BC Advanced Practice Stroke Nurse [place] for Schedule & Pager information 07/13/2017 1:20 PM  I have personally examined this patient, reviewed notes, independently viewed imaging studies, participated in medical decision making and plan of care.ROS completed by me personally and pertinent positives fully documented  I have made any additions or clarifications directly to the above note. Agree with note above.   Antony Contras, MD Medical Director [facility] Pager: [phone] 07/13/2017 1:45 PM

## 2017-07-13 NOTE — H&P (Signed)
Physical Medicine and Rehabilitation Admission H&P    Chief Complaint  Patient presents with  . Functional deficits due ot stroke   HPI: Lindsey [name] is a 47 year old female with history of CAD s/p stenting, history of apical mural thrombus with CVA 2016 --residual STM deficits and right field cut, medication non-compliance,  GERD, HTN who was admitted on 07/08/17 with sudden onset of left facial droop and LUE weakness. History taken from chart review and patient. CT head reviewed, unremarkable for acute intracranial process. Cerebral angio revealing occluded superior division mid 1/3 division of R-MCA with extensive collaterals and spontaneous recanalization therefore no endovascular treatment recommended. MRI brain done revealing right lateral frontal and insula early/subacute infarct without hemorrhage.    2D echo showed fixed, medium apical thrombus with mild LVH, EF 55-60% with grade one DD.   Review of Systems  Constitutional: Negative for chills and fever.  HENT: Negative for hearing loss and tinnitus.   Eyes: Negative for blurred vision and double vision.  Respiratory: Negative for cough, shortness of breath and wheezing.   Cardiovascular: Negative for chest pain and palpitations.  Gastrointestinal: Negative for constipation, heartburn and nausea.  Genitourinary: Negative for dysuria and urgency.  Skin: Negative for itching and rash.  Neurological: Positive for sensory change, speech change, focal weakness and headaches.  Psychiatric/Behavioral: The patient has insomnia (due to HA). The patient is not nervous/anxious.   All other systems reviewed and are negative.   Past Medical History:  Diagnosis Date  . Anemia   . Antithrombin III deficiency ([place])    ?pt not sure if true diagnosis  . Anxiety   . Apical mural thrombus   . Blood transfusion   . Cholecystitis 07/2016  . Coronary artery disease    a. apical LAD infarction '00. b. NSTEMI s/p BMS to prox LAD '09. c. Cath  01/2015: stable LAD stent, otherwise minimal nonobstructive CAD.  Marland Kitchen GERD (gastroesophageal reflux disease)   . Hyperlipidemia   . Hypertension   . Myocardial infarction ([place])   . Noncompliance with medication regimen    a. h/o noncompliance with med regimen (previous running out of Coumadin).  . Stroke ([place])    assoc with short term memory loss and right peripheral vision loss; age 16   . TIA (transient ischemic attack) 2010  . Tobacco abuse     Past Surgical History:  Procedure Laterality Date  . CARDIAC CATHETERIZATION    . CARDIAC CATHETERIZATION N/A 01/29/2015   Procedure: Left Heart Cath and Coronary Angiography;  Surgeon: Peter M Martinique, MD;  Location: [facility];  Service: Cardiovascular;  Laterality: N/A;  . CHOLECYSTECTOMY N/A 08/14/2016   Procedure: LAPAROSCOPIC CHOLECYSTECTOMY;  Surgeon: Greer Pickerel, MD;  Location: [facility];  Service: General;  Laterality: N/A;  . COLONOSCOPY N/A 03/29/2014   Procedure: COLONOSCOPY;  Surgeon: Inda Castle, MD;  Location: [facility];  Service: Endoscopy;  Laterality: N/A;  . CORONARY ANGIOPLASTY    . ESOPHAGOGASTRODUODENOSCOPY N/A 06/09/2012   Procedure: ESOPHAGOGASTRODUODENOSCOPY (EGD);  Surgeon: Beryle Beams, MD;  Location: [facility];  Service: Endoscopy;  Laterality: N/A;  . ESOPHAGOGASTRODUODENOSCOPY N/A 08/02/2013   Procedure: ESOPHAGOGASTRODUODENOSCOPY (EGD);  Surgeon: Ladene Artist, MD;  Location: [facility];  Service: Endoscopy;  Laterality: N/A;  . IR ANGIO INTRA EXTRACRAN SEL COM CAROTID INNOMINATE UNI R MOD SED  07/08/2017  . LEFT HEART CATHETERIZATION WITH CORONARY ANGIOGRAM N/A 12/24/2011   Procedure: LEFT HEART CATHETERIZATION WITH CORONARY ANGIOGRAM;  Surgeon: Burnell Blanks,  MD;  Location: [facility];  Service: Cardiovascular;  Laterality: N/A;  . RADIOLOGY WITH ANESTHESIA N/A 07/08/2017   Procedure: IR WITH ANESTHESIA;  Surgeon: Radiologist, Medication, MD;  Location: [facility];  Service: Radiology;   Laterality: N/A;  . TUBAL LIGATION      Family History  Problem Relation Age of Onset  . Heart disease Brother        arrhythmia; died  . Breast cancer Maternal Aunt     Social History:  Lives with family--mother and 2 daughters 25/55 years old. She reports that she has been smoking cigarettes--3 per day She has a 0.20 pack-year smoking history. She has never used smokeless tobacco. She reports that she drinks alcohol-- few times a week.  She reports that she has does not use Cocaine any more.    Allergies: No Known Allergies    Medications Prior to Admission  Medication Sig Dispense Refill  . acetaminophen (TYLENOL) 500 MG tablet Take 1,000 mg by mouth every 6 (six) hours as needed for headache (pain).    Marland Kitchen amLODipine (NORVASC) 10 MG tablet Take 10 mg by mouth daily.  3  . atorvastatin (LIPITOR) 40 MG tablet Take 1 tablet (40 mg total) by mouth daily. 30 tablet 2  . carvedilol (COREG) 25 MG tablet Take 1 tablet (25 mg total) by mouth 2 (two) times daily with a meal. 60 tablet 1  . clopidogrel (PLAVIX) 75 MG tablet Take 1 tablet (75 mg total) by mouth daily. 30 tablet 1  . doxycycline (VIBRAMYCIN) 100 MG capsule Take 1 capsule (100 mg total) by mouth 2 (two) times daily. 28 capsule 0  . hydrochlorothiazide (HYDRODIURIL) 25 MG tablet Take 25 mg by mouth daily.  3  . HYDROcodone-acetaminophen (NORCO/VICODIN) 5-325 MG tablet Take 1 tablet by mouth every 6 (six) hours as needed. 8 tablet 0  . hydrocortisone (ANUSOL-HC) 2.5 % rectal cream Apply rectally 2 times daily prn 28 g 0  . metroNIDAZOLE (FLAGYL) 500 MG tablet Take 1 tablet (500 mg total) by mouth 2 (two) times daily. 28 tablet 0  . nitroGLYCERIN (NITROSTAT) 0.4 MG SL tablet Place 1 tablet (0.4 mg total) under the tongue every 5 (five) minutes as needed for chest pain (up to 3 doses). 15 tablet 0  . ondansetron (ZOFRAN ODT) 8 MG disintegrating tablet Take 1 tablet (8 mg total) by mouth every 8 (eight) hours as needed for nausea or  vomiting. 12 tablet 0  . oxyCODONE (OXY IR/ROXICODONE) 5 MG immediate release tablet Take 1-2 tablets (5-10 mg total) by mouth every 4 (four) hours as needed for moderate pain. 20 tablet 0  . pantoprazole (PROTONIX) 40 MG tablet Take 1 tablet (40 mg total) by mouth daily. 30 tablet 1  . Pseudoephedrine-APAP-DM (DAYQUIL PO) Take 2 capsules by mouth as needed (cough).    . rivaroxaban (XARELTO) 20 MG TABS tablet Take 1 tablet (20 mg total) by mouth daily with supper. 30 tablet 1  . vitamin B-12 (CYANOCOBALAMIN) 1000 MCG tablet Take 1 tablet (1,000 mcg total) by mouth daily. 30 tablet 2  . docusate sodium (COLACE) 100 MG capsule Take 1 capsule (100 mg total) by mouth 2 (two) times daily. (Patient not taking: Reported on 07/09/2017) 10 capsule 0  . methocarbamol (ROBAXIN) 500 MG tablet Take 1 tablet (500 mg total) by mouth 3 (three) times daily. (Patient not taking: Reported on 07/09/2017) 15 tablet 0    Drug Regimen Review  Drug regimen was reviewed and remains appropriate with no significant  issues identified  Home: Home Living Family/patient expects to be discharged to:: Private residence Living Arrangements: Children Available Help at Discharge: Family Type of Home: House Home Access: Stairs to enter Technical brewer of Steps: 2 Entrance Stairs-Rails: Right, Left Home Layout: Two level, Bed/bath upstairs Bathroom Shower/Tub: Chiropodist: Standard Home Equipment: None Additional Comments: Pt insists her family can provide 24 hour assist, although she states her oldest daughter is a Ship broker and working, and her mother works 7-3 daily.  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  Transfers Overall transfer level: Needs assistance Equipment used: None Transfers: Sit to/from Stand Sit to Stand: Min guard General transfer comment: cues for left hand placement Ambulation/Gait Ambulation/Gait assistance: Min assist Ambulation Distance (Feet): 40 Feet Assistive device: Rolling walker (2 wheeled), 1 person hand held assist(Pt ambulated first with hand held assist then with RW) Gait Pattern/deviations: Step-to pattern, Decreased stride length General Gait Details: right foot tends to drag without verbal cueing, very short stride lenth, some difficulty with sequencing, inattention to left side unless cued Gait velocity interpretation: <1.8 ft/sec, indicate of risk for recurrent falls    ADL: ADL Overall ADL's : Needs assistance/impaired Eating/Feeding: Supervision/ safety, Sitting Eating/Feeding Details (indicate cue type and reason): educated on incorporating use of L hand to open,stabalize containers to promote use (able to hold butter container with left hand and pull lid off with right)  Grooming: Wash/dry hands, Wash/dry face, Min guard, Standing Grooming Details (indicate cue type and reason): assist for sequencing, organization, problem solving (cues to turn water off after she was finished) Upper Body Bathing: Moderate assistance, Sitting Upper Body Bathing Details (indicate cue type and reason): for thoroughness Lower Body Bathing: Moderate assistance, Sit to/from stand Lower Body Bathing Details (indicate cue type and reason): for thoroughness  Upper Body Dressing : Moderate assistance, Sitting Lower Body Dressing: Sit to/from stand, Moderate assistance Lower Body Dressing Details (indicate cue type and reason): requires assist to thread Lt LE through pant leg as well as assist pulling pants over Lt hip and with  pulling socks over feet  Toilet Transfer: Minimal assistance, Ambulation, Regular Toilet, Grab bars Toileting- Clothing Manipulation and Hygiene: Min guard, Sitting/lateral lean Toileting - Clothing Manipulation Details (indicate cue type and reason): unaware of not pulling underwear up on L hip Functional mobility during ADLs: Minimal assistance General ADL Comments: L inattention still noted during in room ambulation.  cont. cues required to scan left and place L hand on RW.    Cognition: Cognition Overall Cognitive Status: Impaired/Different from baseline Arousal/Alertness: Awake/alert Orientation Level: Oriented X4 Attention: Selective Selective Attention: Impaired Memory: Impaired Memory Impairment: Prospective memory, Decreased recall of new information, Storage deficit, Retrieval deficit Awareness: Impaired Awareness Impairment: Intellectual impairment Problem Solving: Impaired Problem Solving Impairment: Functional basic, Verbal basic Safety/Judgment: Impaired Cognition Arousal/Alertness: Awake/alert Behavior During Therapy: Flat affect Overall Cognitive Status: Impaired/Different from baseline Area of Impairment: Attention Current Attention Level: Selective Memory: Decreased short-term memory Following Commands: Follows one step commands with increased time, Follows one step commands consistently, Follows multi-step commands inconsistently Safety/Judgement: Decreased awareness of safety, Decreased awareness  of deficits Awareness: Emergent Problem Solving: Slow processing, Decreased initiation, Difficulty sequencing, Requires verbal cues, Requires tactile cues General Comments: Pt perseverating on stepping over blue diamonds in floor. .Pt walked out in hall adn read room number. Less than 1 min later, pt unable to recall room number. Pt unable to find room after walking around hall. Attempted to use signs to problem solve how to find room unsuccessfully. Pt running  therapistinto wall on L when not attending to L.    Blood pressure 122/87, pulse (!) 52, temperature 98.6 F (37 C), temperature source Oral, resp. rate 20, height '5\' 6"'  (1.676 m), weight 78.4 kg (172 lb 13.5 oz), SpO2 100 %. Physical Exam  Vitals reviewed. Constitutional: She is oriented to person, place, and time. She appears well-developed and well-nourished.  HENT:  Head: Normocephalic and atraumatic.  Eyes: EOM are normal. Right eye exhibits no discharge. Left eye exhibits no discharge.  Neck: Normal range of motion. Neck supple.  Cardiovascular: Normal rate and regular rhythm.  Respiratory: Effort normal and breath sounds normal.  GI: Soft. Bowel sounds are normal.  Musculoskeletal:  No edema or tenderness in extremities  Neurological: She is alert and oriented to person, place, and time.  Left facial weakness Sensation diminished to light touch left face Motor: 4+-5/5 throughout Dysarthria  Skin: Skin is warm and dry.  Psychiatric: She has a normal mood and affect. Her behavior is normal. Thought content normal.    Results for orders placed or performed during the hospital encounter of 07/08/17 (from the past 48 hour(s))  CBC     Status: Abnormal   Collection Time: 07/12/17  9:12 AM  Result Value Ref Range   WBC 5.5 4.0 - 10.5 K/uL   RBC 3.81 (L) 3.87 - 5.11 MIL/uL   Hemoglobin 11.0 (L) 12.0 - 15.0 g/dL   HCT 34.2 (L) 36.0 - 46.0 %   MCV 89.8 78.0 - 100.0 fL   MCH 28.9 26.0 - 34.0 pg   MCHC 32.2 30.0 - 36.0 g/dL   RDW 13.5 11.5 - 15.5 %   Platelets 279 150 - 400 K/uL    Comment: Performed at [facility], [place] [address] [place], [place] [address]  Basic metabolic panel     Status: Abnormal   Collection Time: 07/12/17  9:12 AM  Result Value Ref Range   Sodium 139 135 - 145 mmol/L   Potassium 3.7 3.5 - 5.1 mmol/L   Chloride 109 101 - 111 mmol/L   CO2 24 22 - 32 mmol/L   Glucose, Bld 93 65 - 99 mg/dL   BUN 8 6 - 20 mg/dL   Creatinine, Ser 0.73 0.44 - 1.00  mg/dL   Calcium 8.5 (L) 8.9 - 10.3 mg/dL   GFR calc non Af Amer >60 >60 mL/min   GFR calc Af Amer >60 >60 mL/min    Comment: (NOTE) The eGFR has been calculated using the CKD EPI equation. This calculation has not been validated in all clinical situations. eGFR's persistently <60 mL/min signify possible Chronic Kidney Disease.    Anion gap 6 5 - 15    Comment: Performed at [facility] [address] [place] [address]  CBC     Status: Abnormal   Collection Time: 07/13/17  3:12 AM  Result Value Ref Range   WBC 5.7 4.0 - 10.5 K/uL   RBC 3.66 (L) 3.87 - 5.11 MIL/uL   Hemoglobin 10.4 (L) 12.0 - 15.0 g/dL   HCT  32.7 (L) 36.0 - 46.0 %   MCV 89.3 78.0 - 100.0 fL   MCH 28.4 26.0 - 34.0 pg   MCHC 31.8 30.0 - 36.0 g/dL   RDW 13.2 11.5 - 15.5 %   Platelets 266 150 - 400 K/uL    Comment: Performed at [facility] [address], [place], [place] [address]  Basic metabolic panel     Status: Abnormal   Collection Time: 07/13/17  3:12 AM  Result Value Ref Range   Sodium 138 135 - 145 mmol/L   Potassium 3.7 3.5 - 5.1 mmol/L   Chloride 109 101 - 111 mmol/L   CO2 24 22 - 32 mmol/L   Glucose, Bld 109 (H) 65 - 99 mg/dL   BUN 10 6 - 20 mg/dL   Creatinine, Ser 0.74 0.44 - 1.00 mg/dL   Calcium 8.5 (L) 8.9 - 10.3 mg/dL   GFR calc non Af Amer >60 >60 mL/min   GFR calc Af Amer >60 >60 mL/min    Comment: (NOTE) The eGFR has been calculated using the CKD EPI equation. This calculation has not been validated in all clinical situations. eGFR's persistently <60 mL/min signify possible Chronic Kidney Disease.    Anion gap 5 5 - 15    Comment: Performed at [facility] [address] [place],  [address]   No results found.  Medical Problem List and Plan: 1.  Deficits with mobility, dysarthria, self-care secondary to right lateral frontal and insula early/subacute infarct. 2.  Apical mural thrombus/DVT prophylaxis/Anticoagulation: Pharmaceutical: Xarelto 3.  Pain Management: tylenol pron 4. Mood: LCSW to follow for evaluation and support.  5. Neuropsych: This patient is capable of making decisions on her own behalf. 6. Skin/Wound Care: routine pressure relief measures.  7. Fluids/Electrolytes/Nutrition: Monitor I/O--check lytes in am. 8. HTN: Monitor bid. Continue Coreg bid 9. Headaches: Report constant piercing/stabbing pain. Will start patient on topamax.    Post Admission Physician Evaluation: 1. Preadmission assessment reviewed and changes made below. 2. Functional deficits secondary  to right lateral frontal and insula early/subacute infarct. 3. Patient is admitted to receive collaborative, interdisciplinary care between the physiatrist, rehab nursing staff, and therapy team. 4. Patient's level of medical complexity and substantial therapy needs in context of that medical necessity cannot be provided at a lesser intensity of care such as a SNF. 5. Patient has experienced substantial functional loss from his/her baseline which was documented above under the "Functional History" and "Functional Status" headings.  Judging by the patient's diagnosis, physical exam, and functional history, the patient has potential for functional progress which will result in measurable gains while on inpatient rehab.  These gains will be of substantial and practical use upon discharge  in facilitating mobility and self-care at the household level. 21. Physiatrist will provide 24 hour management of medical needs as well as oversight of the therapy plan/treatment and provide guidance as appropriate regarding the interaction of the two. 7. 24 hour rehab nursing will assist with safety, disease management and patient education  and help integrate therapy concepts, techniques,education, etc. 8. PT will assess and treat for/with: Lower extremity strength, range of motion, stamina, balance, functional mobility, safety, adaptive techniques and equipment, wound care, coping  skills, pain control, stroke education. Goals are: Mod I. 9. OT will assess and treat for/with: ADL's, functional mobility, safety, upper extremity strength, adaptive techniques and equipment, wound mgt, ego support, and community reintegration.   Goals are: Mod I. Therapy may proceed with showering this  patient. 10. SLP will assess and treat for/with: speech.  Goals are: Mod I. 11. Case Management and Social Worker will assess and treat for psychological issues and discharge planning. 12. Team conference will be held weekly to assess progress toward goals and to determine barriers to discharge. 13. Patient will receive at least 3 hours of therapy per day at least 5 days per week. 14. ELOS: 5-9 days.       15. Prognosis:  good  I have personally performed a face to face diagnostic evaluation, including, but not limited to relevant history and physical exam findings, of this patient and developed relevant assessment and plan.  Additionally, I have reviewed and concur with the physician assistant's documentation above.   Delice Lesch, MD, ABPMR Bary Leriche, PA-C 07/13/2017

## 2017-07-13 NOTE — Progress Notes (Signed)
  Speech Language Pathology Treatment: Cognitive-Linquistic  Patient Details Name: Lindsey [name] MRN: [id number] DOB: 01-13-1971 Today's Date: 07/13/2017 Time: 1040-1100 SLP Time Calculation (min) (ACUTE ONLY): 20 min  Assessment / Plan / Recommendation Clinical Impression  Pt is demonstrating improved intellectual awareness of deficits; she verbalized surprise at her difficulty finding items in the left side of space.  Demonstrated improved short-term recall of events of this am, improved sequencing and functional problem-solving when using phone with min verbal cues to pause and self-inhibit actions. Pt stated  "I can't wait to go to rehab!" - very positive and enthusiastic.  Continue SLP for cognitive-linguistic deficits pending D/C to CIR.  HPI HPI: [name]. PMHx CVA at age 87, TIA 56, MI 2009, antithrombin III deficiency, apical mural thrombus, CAD, hypertension, hyperlipidemia.  MRI: Right lateral frontal lobe and insula acute/early subacute infarction.      SLP Plan  Continue with current plan of care       Recommendations                   Plan: Continue with current plan of care       GO                Lindsey [name] 07/13/2017, 11:04 AM

## 2017-07-13 NOTE — PMR Pre-admission (Signed)
PMR Admission Coordinator Pre-Admission Assessment  Patient: Lindsey [name] is an 47 y.o., female MRN: [id number] DOB: 05/08/1970 Height: 5\' 6"  (167.6 cm) Weight: 78.4 kg (172 lb 13.5 oz)              Insurance Information HMO:      PPO:       PCP:       IPA:       80/20:       OTHER:   PRIMARY: Medicaid [place] access      Policy#: [id number]      Subscriber:  patient CM Name:        Phone#:       Fax#:   Pre-Cert#:        Employer: [employer] Benefits:  Phone #: [phone]     Name:  Automated Eff. Date: Eligible 07/13/17 with coverage code MAFCN     Deduct:        Out of Pocket Max:        Life Max:   CIR:        SNF:   Outpatient:       Co-Pay:   Home Health:        Co-Pay:   DME:       Co-Pay:   Providers:    Medicaid Application Date:        Case Manager:   Disability Application Date:        Case Worker:    Emergency Contact Information Contact Information    Name Relation Home Work Mobile   [place] Mother [phone]     Alesia Richards Spouse        Current Medical History  Patient Admitting Diagnosis:  R MCA infarct  History of Present Illness: A 47 y.o. female with prior history of stroke and Antithrombin III deficiency with hx of apical mural thrombus who presented on 4/24 with left facial droop and weakness.  Patient went for emergent diagnostic cerebral angiogram the same day by interventional radiology with recanalization of her right MCA nondominant superior division.  MRI on 4/25 revealed right lateral frontal lobe and insula acute/early subacute infarction.  Patient with ongoing deficits in spatial attention to the left as well as memory problem solving balance and left-sided weakness.  Physical medicine rehabilitation was consulted to assess patient for potential inpatient rehab needs.  Total: 4=NIH  Past Medical History  Past Medical History:  Diagnosis Date  . Anemia   . Antithrombin III deficiency (HCC)    ?pt not sure if true  diagnosis  . Anxiety   . Apical mural thrombus   . Blood transfusion   . Cholecystitis 07/2016  . Coronary artery disease    a. apical LAD infarction '00. b. NSTEMI s/p BMS to prox LAD '09. c. Cath 01/2015: stable LAD stent, otherwise minimal nonobstructive CAD.  Marland Kitchen GERD (gastroesophageal reflux disease)   . Hyperlipidemia   . Hypertension   . Myocardial infarction (HCC)   . Noncompliance with medication regimen    a. h/o noncompliance with med regimen (previous running out of Coumadin).  . Stroke (HCC)    assoc with short term memory loss and right peripheral vision loss; age 7   . TIA (transient ischemic attack) 2010  . Tobacco abuse     Family History  family history includes Breast cancer in her maternal aunt; Heart disease in her brother.  Prior Rehab/Hospitalizations: Patient states she had her gallbladder removed in 06/18.  Has the patient had  major surgery during 100 days prior to admission? No  Current Medications   Current Facility-Administered Medications:  .  0.9 %  sodium chloride infusion, , Intravenous, Continuous, Marvel Plan, MD, Last Rate: 40 mL/hr at 07/10/17 0607, 1,000 mL at 07/10/17 0607 .  acetaminophen (TYLENOL) tablet 650 mg, 650 mg, Oral, Q4H PRN, 650 mg at 07/10/17 0608 **OR** acetaminophen (TYLENOL) solution 650 mg, 650 mg, Per Tube, Q4H PRN **OR** acetaminophen (TYLENOL) suppository 650 mg, 650 mg, Rectal, Q4H PRN, Caryl Pina, MD .  atorvastatin (LIPITOR) tablet 40 mg, 40 mg, Oral, Daily, Caryl Pina, MD, 40 mg at 07/13/17 0921 .  butalbital-acetaminophen-caffeine (FIORICET, ESGIC) 50-325-40 MG per tablet 1 tablet, 1 tablet, Oral, Q6H PRN, Metzger-Cihelka, Desiree, NP, 1 tablet at 07/13/17 0921 .  carvedilol (COREG) tablet 25 mg, 25 mg, Oral, BID WC, Caryl Pina, MD, 25 mg at 07/13/17 1056 .  clopidogrel (PLAVIX) tablet 75 mg, 75 mg, Oral, Daily, Marvel Plan, MD, 75 mg at 07/13/17 0921 .  HYDROcodone-acetaminophen (NORCO/VICODIN) 5-325 MG per  tablet 1 tablet, 1 tablet, Oral, Q6H PRN, Caryl Pina, MD, 1 tablet at 07/13/17 0800 .  hydrocortisone (ANUSOL-HC) 2.5 % rectal cream, , Rectal, PRN, Caryl Pina, MD .  nitroGLYCERIN (NITROSTAT) SL tablet 0.4 mg, 0.4 mg, Sublingual, Q5 min PRN, Caryl Pina, MD .  pantoprazole (PROTONIX) EC tablet 40 mg, 40 mg, Oral, Daily, Marvel Plan, MD, 40 mg at 07/13/17 0921 .  rivaroxaban (XARELTO) tablet 20 mg, 20 mg, Oral, Q supper, Caryl Pina, MD, 20 mg at 07/12/17 1859 .  senna-docusate (Senokot-S) tablet 1 tablet, 1 tablet, Oral, QHS PRN, Caryl Pina, MD .  vitamin B-12 (CYANOCOBALAMIN) tablet 1,000 mcg, 1,000 mcg, Oral, Daily, Caryl Pina, MD, 1,000 mcg at 07/13/17 1610  Patients Current Diet: Fall precautions Diet Heart Room service appropriate? Yes; Fluid consistency: Thin  Precautions / Restrictions Precautions Precautions: Fall Precaution Comments: left inattention Restrictions Weight Bearing Restrictions: No   Has the patient had 2 or more falls or a fall with injury in the past year?No  Prior Activity Level Community (5-7x/wk): Worked PT at [employer] 6 days a week, was driving, went out daily.  Home Assistive Devices / Equipment Home Equipment: None  Prior Device Use: Indicate devices/aids used by the patient prior to current illness, exacerbation or injury? None  Prior Functional Level Prior Function Level of Independence: Independent Comments: works at a [employer] as a Conservation officer, nature, lives with 13 and 70 yo kids  Self Care: Did the patient need help bathing, dressing, using the toilet or eating?  Independent  Indoor Mobility: Did the patient need assistance with walking from room to room (with or without device)? Independent  Stairs: Did the patient need assistance with internal or external stairs (with or without device)? Independent  Functional Cognition: Did the patient need help planning regular tasks such as shopping or remembering to take  medications? Independent  Current Functional Level Cognition  Arousal/Alertness: Awake/alert Overall Cognitive Status: Impaired/Different from baseline Current Attention Level: Selective Orientation Level: Oriented X4 Following Commands: Follows one step commands with increased time, Follows one step commands consistently, Follows multi-step commands inconsistently Safety/Judgement: Decreased awareness of safety, Decreased awareness of deficits General Comments: Pt perseverating on stepping over blue diamonds in floor. .Pt walked out in hall adn read room number. Less than 1 min later, pt unable to recall room number. Pt unable to find room after walking around hall. Attempted to use signs to problem solve how to find room unsuccessfully. Pt running  therapistinto wall on L when not attending to L.  Attention: Selective Selective Attention: Impaired Memory: Impaired Memory Impairment: Prospective memory, Decreased recall of new information, Storage deficit, Retrieval deficit Awareness: Impaired Awareness Impairment: Intellectual impairment Problem Solving: Impaired Problem Solving Impairment: Functional basic, Verbal basic Safety/Judgment: Impaired    Extremity Assessment (includes Sensation/Coordination)  Upper Extremity Assessment: LUE deficits/detail LUE Deficits / Details: Pt demonstrates decreased oposition of Lt hand.  She appears to have proprioceptive deficits as assessed through function, and when asks, she states "it feels like its dead".  Demonstrates difficulty pulling on socks with Lt UE and with manipulating objects in Lt UE  LUE Sensation: decreased proprioception LUE Coordination: decreased fine motor  Lower Extremity Assessment: Defer to PT evaluation    ADLs  Overall ADL's : Needs assistance/impaired Eating/Feeding: Supervision/ safety, Sitting Eating/Feeding Details (indicate cue type and reason): educated on incorporating use of L hand to open,stabalize containers  to promote use (able to hold butter container with left hand and pull lid off with right)  Grooming: Wash/dry hands, Wash/dry face, Min guard, Standing Grooming Details (indicate cue type and reason): assist for sequencing, organization, problem solving (cues to turn water off after she was finished) Upper Body Bathing: Moderate assistance, Sitting Upper Body Bathing Details (indicate cue type and reason): for thoroughness Lower Body Bathing: Moderate assistance, Sit to/from stand Lower Body Bathing Details (indicate cue type and reason): for thoroughness  Upper Body Dressing : Moderate assistance, Sitting Lower Body Dressing: Sit to/from stand, Moderate assistance Lower Body Dressing Details (indicate cue type and reason): requires assist to thread Lt LE through pant leg as well as assist pulling pants over Lt hip and with pulling socks over feet  Toilet Transfer: Minimal assistance, Ambulation, Regular Toilet, Grab bars Toileting- Clothing Manipulation and Hygiene: Min guard, Sitting/lateral lean Toileting - Clothing Manipulation Details (indicate cue type and reason): unaware of not pulling underwear up on L hip Functional mobility during ADLs: Minimal assistance General ADL Comments: L inattention still noted during in room ambulation.  cont. cues required to scan left and place L hand on RW.      Mobility  Overal bed mobility: Needs Assistance Bed Mobility: Supine to Sit Supine to sit: Supervision Sit to supine: Supervision General bed mobility comments: increased time and effort     Transfers  Overall transfer level: Needs assistance Equipment used: Rolling walker (2 wheeled) Transfers: Sit to/from Stand Sit to Stand: Min guard General transfer comment: cues for hand placement and sequencing     Ambulation / Gait / Stairs / Wheelchair Mobility  Ambulation/Gait Ambulation/Gait assistance: Architect (Feet): 100 Feet Assistive device: Rolling walker (2  wheeled) Gait Pattern/deviations: Step-through pattern, Decreased step length - right, Decreased step length - left, Decreased stride length, Shuffle, Decreased dorsiflexion - right, Decreased dorsiflexion - left, Drifts right/left General Gait Details: requires cues to normalize gait pattern as she either marches or shuffles both feet, difficulty with sequencing, cues for attention to L side and for staight line navigation; performed activities such as greeting people on L side and cross midline reaches with L UE in standing as well Gait velocity interpretation: <1.8 ft/sec, indicate of risk for recurrent falls    Posture / Balance Balance Overall balance assessment: Needs assistance Sitting balance-Leahy Scale: Good Standing balance-Leahy Scale: Fair Standing balance comment: LOB when distracted     Special needs/care consideration BiPAP/CPAP No CPM No Continuous Drip IV 0.9% NS 40 mL/hr Dialysis No  Life Vest No Oxygen No Special Bed No Trach Size No Wound Vac (area) No    Skin No                            Bowel mgmt: Last BM 07/12/17 Bladder mgmt: Up to BRP with assistance Diabetic mgmt No    Previous Home Environment Living Arrangements: Children  Lives With: Family(mother and daughters (13 and 48)) Available Help at Discharge: Family Type of Home: House Home Layout: Two level, Bed/bath upstairs Home Access: Stairs to enter Entrance Stairs-Rails: Right, Left Entrance Stairs-Number of Steps: 2 Bathroom Shower/Tub: Engineer, manufacturing systems: Standard Additional Comments: Pt insists her family can provide 24 hour assist, although she states her oldest daughter is a Consulting civil engineer and working, and her mother works 7-3 daily.  Youngest daughter is 36 y.o   Discharge Living Setting Plans for Discharge Living Setting: House, Lives with (comment)(Lives with mom and 2 daughters ages 59 and 48.) Type of Home at Discharge: House Discharge Home Layout: Two level, 1/2 bath on  main level, Bed/bath upstairs Alternate Level Stairs-Number of Steps: 14 Discharge Home Access: Stairs to enter Entrance Stairs-Number of Steps: 1 step entry  Social/Family/Support Systems Patient Roles: Parent, Other (Comment)(Has mom, BF, 2 dtrs ages 62 and 77.) Contact Information: Marylene Land - mother - [phone] Anticipated Caregiver: Self, mom, boyfriend Ability/Limitations of Caregiver: Mom works 7 am to 3 pm.  Boyfriend, Romeo Apple, is on disability and can assist as needed. Caregiver Availability: Intermittent Discharge Plan Discussed with Primary Caregiver: Yes Is Caregiver In Agreement with Plan?: Yes Does Caregiver/Family have Issues with Lodging/Transportation while Pt is in Rehab?: No  Goals/Additional Needs Patient/Family Goal for Rehab: PT/OT/SLP mod I goals Expected length of stay: 7-10 days Cultural Considerations: None Dietary Needs: Heart diet, thin lqiuds Equipment Needs: TBD Pt/Family Agrees to Admission and willing to participate: Yes Program Orientation Provided & Reviewed with Pt/Caregiver Including Roles  & Responsibilities: Yes  Decrease burden of Care through IP rehab admission: N/A  Possible need for SNF placement upon discharge: Not anticipated  Patient Condition: This patient's condition remains as documented in the consult dated 07/12/17, in which the Rehabilitation Physician determined and documented that the patient's condition is appropriate for intensive rehabilitative care in an inpatient rehabilitation facility. Will admit to inpatient rehab today.  Preadmission Screen Completed By:  Trish Mage, 07/13/2017 1:38 PM ______________________________________________________________________   Discussed status with Dr. Allena Katz on 07/13/17 at 1338 and received telephone approval for admission today.  Admission Coordinator:  Trish Mage, time 1338/Date 07/13/17

## 2017-07-13 NOTE — Progress Notes (Signed)
Pt. Continues to c/o headache unrelieved after Vicodin and Fiorcet. Cristopher Estimable, NP on unit and made aware. No further orders received.

## 2017-07-13 NOTE — Care Management Note (Signed)
Case Management Note  Patient Details  Name: Lindsey [name] MRN: [id number] Date of Birth: January 19, 1971  Subjective/Objective:                    Action/Plan: Pt is discharging to CIR today. CM signing off.   Expected Discharge Date:  07/13/17               Expected Discharge Plan:  Home w Home Health Services  In-House Referral:     Discharge planning Services  CM Consult  Post Acute Care Choice:  Home Health Choice offered to:     DME Arranged:    DME Agency:     HH Arranged:    HH Agency:     Status of Service:  Completed, signed off  If discussed at [employer], dates discussed:    Additional Comments:  Kermit Balo, RN 07/13/2017, 2:11 PM

## 2017-07-13 NOTE — Progress Notes (Signed)
Report called to [place] on 4W. Await pt.'s dinner tray and will then transport to 4W03.

## 2017-07-13 NOTE — Progress Notes (Signed)
Physical Therapy Treatment Patient Details Name: Lindsey [name] MRN: [id number] DOB: [birth year] Today's Date: 07/13/2017    History of Present Illness [name] 4/24 - Patient spontaneously recanalized during the angiogram. PMHx CVA at age 47, TIA 2010, MI 2009, antithrombin III deficiency, apical mural thrombus, CAD, hypertension, hyperlipidemia.    PT Comments    Patient received in bed and very motivated to participate in PT this morning. She is able to perform bed mobility with S, increased time and effort and cues for sequencing and technique, and requires Min guard to MinA to ambulate approximately 11f today. Performed L sided activities including acknowledging people on the L in the hallway as well as cross midline reaches for balloons with L UE at nurses station today. Ms. WRadwanremains very motivated and an excellent candidate for aggressive therapies in the CIR setting. Educated family to try to engage and hand her items on the left to continue addressing L sided inattention/promote L UE use in the room. She was left up in the chair with all needs met, chair alarm active, visitors present this morning.     Follow Up Recommendations  CIR;Supervision for mobility/OOB     Equipment Recommendations  Rolling walker with 5" wheels    Recommendations for Other Services Speech consult     Precautions / Restrictions Precautions Precautions: Fall Precaution Comments: left inattention Restrictions Weight Bearing Restrictions: No    Mobility  Bed Mobility Overal bed mobility: Needs Assistance Bed Mobility: Supine to Sit     Supine to sit: Supervision     General bed mobility comments: increased time and effort   Transfers Overall transfer level: Needs assistance Equipment used: Rolling walker (2 wheeled) Transfers: Sit to/from Stand Sit to Stand: Min guard         General transfer comment: cues for hand  placement and sequencing   Ambulation/Gait Ambulation/Gait assistance: Min assist Ambulation Distance (Feet): 100 Feet Assistive device: Rolling walker (2 wheeled) Gait Pattern/deviations: Step-through pattern;Decreased step length - right;Decreased step length - left;Decreased stride length;Shuffle;Decreased dorsiflexion - right;Decreased dorsiflexion - left;Drifts right/left     General Gait Details: requires cues to normalize gait pattern as she either marches or shuffles both feet, difficulty with sequencing, cues for attention to L side and for staight line navigation; performed activities such as greeting people on L side and cross midline reaches with L UE in standing as well   Stairs             Wheelchair Mobility    Modified Rankin (Stroke Patients Only) Modified Rankin (Stroke Patients Only) Pre-Morbid Rankin Score: No significant disability Modified Rankin: Moderately severe disability     Balance Overall balance assessment: Needs assistance   Sitting balance-Leahy Scale: Good       Standing balance-Leahy Scale: Fair Standing balance comment: LOB when distracted                             Cognition Arousal/Alertness: Awake/alert Behavior During Therapy: Flat affect Overall Cognitive Status: Impaired/Different from baseline Area of Impairment: Attention                   Current Attention Level: Selective Memory: Decreased short-term memory Following Commands: Follows one step commands with increased time;Follows one step commands consistently;Follows multi-step commands inconsistently Safety/Judgement: Decreased awareness of safety;Decreased awareness of deficits Awareness: Emergent Problem Solving: Slow processing;Decreased initiation;Difficulty sequencing;Requires  verbal cues;Requires tactile cues        Exercises      General Comments        Pertinent Vitals/Pain Pain Assessment: Faces Faces Pain Scale: Hurts little  more Pain Location: headache Pain Descriptors / Indicators: Aching;Headache Pain Intervention(s): Limited activity within patient's tolerance;Monitored during session;Repositioned    Home Living                      Prior Function            PT Goals (current goals can now be found in the care plan section) Acute Rehab PT Goals Patient Stated Goal: return to home and work PT Goal Formulation: With patient Time For Goal Achievement: 07/23/17 Potential to Achieve Goals: Good Progress towards PT goals: Progressing toward goals    Frequency    Min 3X/week      PT Plan Current plan remains appropriate    Co-evaluation              AM-PAC PT "6 Clicks" Daily Activity  Outcome Measure  Difficulty turning over in bed (including adjusting bedclothes, sheets and blankets)?: A Little Difficulty moving from lying on back to sitting on the side of the bed? : A Little Difficulty sitting down on and standing up from a chair with arms (e.g., wheelchair, bedside commode, etc,.)?: A Little Help needed moving to and from a bed to chair (including a wheelchair)?: A Little Help needed walking in hospital room?: A Little Help needed climbing 3-5 steps with a railing? : A Little 6 Click Score: 18    End of Session Equipment Utilized During Treatment: Gait belt Activity Tolerance: Patient tolerated treatment well Patient left: in chair;with chair alarm set;with call bell/phone within reach;with family/visitor present   PT Visit Diagnosis: Other abnormalities of gait and mobility (R26.89);Other symptoms and signs involving the nervous system (R29.898)     Time: 8638-1771 PT Time Calculation (min) (ACUTE ONLY): 24 min  Charges:  $Gait Training: 8-22 mins $Neuromuscular Re-education: 8-22 mins                    G Codes:       Deniece Ree PT, DPT, CBIS  Supplemental Physical Therapist [place]   Pager [phone]

## 2017-07-14 ENCOUNTER — Inpatient Hospital Stay (HOSPITAL_COMMUNITY): Payer: Medicaid Other | Admitting: Physical Therapy

## 2017-07-14 ENCOUNTER — Inpatient Hospital Stay (HOSPITAL_COMMUNITY): Payer: Medicaid Other

## 2017-07-14 ENCOUNTER — Inpatient Hospital Stay (HOSPITAL_COMMUNITY): Payer: Medicaid Other | Admitting: Occupational Therapy

## 2017-07-14 DIAGNOSIS — I639 Cerebral infarction, unspecified: Secondary | ICD-10-CM

## 2017-07-14 DIAGNOSIS — G8114 Spastic hemiplegia affecting left nondominant side: Secondary | ICD-10-CM

## 2017-07-14 LAB — CBC WITH DIFFERENTIAL/PLATELET
Basophils Absolute: 0 10*3/uL (ref 0.0–0.1)
Basophils Relative: 0 %
EOS ABS: 0.2 10*3/uL (ref 0.0–0.7)
Eosinophils Relative: 3 %
HCT: 33.4 % — ABNORMAL LOW (ref 36.0–46.0)
HEMOGLOBIN: 10.6 g/dL — AB (ref 12.0–15.0)
LYMPHS ABS: 2.2 10*3/uL (ref 0.7–4.0)
LYMPHS PCT: 33 %
MCH: 28.6 pg (ref 26.0–34.0)
MCHC: 31.7 g/dL (ref 30.0–36.0)
MCV: 90 fL (ref 78.0–100.0)
MONOS PCT: 10 %
Monocytes Absolute: 0.7 10*3/uL (ref 0.1–1.0)
NEUTROS PCT: 54 %
Neutro Abs: 3.6 10*3/uL (ref 1.7–7.7)
Platelets: 256 10*3/uL (ref 150–400)
RBC: 3.71 MIL/uL — AB (ref 3.87–5.11)
RDW: 13.2 % (ref 11.5–15.5)
WBC: 6.7 10*3/uL (ref 4.0–10.5)

## 2017-07-14 LAB — COMPREHENSIVE METABOLIC PANEL
ALK PHOS: 71 U/L (ref 38–126)
ALT: 12 U/L — ABNORMAL LOW (ref 14–54)
AST: 20 U/L (ref 15–41)
Albumin: 2.7 g/dL — ABNORMAL LOW (ref 3.5–5.0)
Anion gap: 9 (ref 5–15)
BILIRUBIN TOTAL: 0.6 mg/dL (ref 0.3–1.2)
BUN: 7 mg/dL (ref 6–20)
CALCIUM: 8.8 mg/dL — AB (ref 8.9–10.3)
CO2: 24 mmol/L (ref 22–32)
Chloride: 107 mmol/L (ref 101–111)
Creatinine, Ser: 0.75 mg/dL (ref 0.44–1.00)
Glucose, Bld: 95 mg/dL (ref 65–99)
Potassium: 3.8 mmol/L (ref 3.5–5.1)
SODIUM: 140 mmol/L (ref 135–145)
TOTAL PROTEIN: 5.5 g/dL — AB (ref 6.5–8.1)

## 2017-07-14 MED ORDER — ACETAMINOPHEN 325 MG PO TABS: 325.0000 mg | ORAL_TABLET | ORAL | Status: DC | PRN

## 2017-07-14 MED ORDER — TOPIRAMATE 25 MG PO TABS
25.0000 mg | ORAL_TABLET | Freq: Two times a day (BID) | ORAL | Status: DC
  Administered 2017-07-14 – 2017-07-21 (×13): 25 mg via ORAL
  Filled 2017-07-14 (×14): qty 1

## 2017-07-14 NOTE — Evaluation (Signed)
Physical Therapy Assessment and Plan  Patient Details  Name: Lindsey [name] MRN: [id number] Date of Birth: Dec 21, 1970  PT Diagnosis: Abnormality of gait, Coordination disorder, Hemiplegia dominant, Impaired cognition and Muscle weakness Rehab Potential: Good ELOS: 7-10 days   Today's Date: 07/14/2017 PT Individual Time: 1829-9371 PT Individual Time Calculation (min): 72 min    Problem List:  Patient Active Problem List   Diagnosis Date Noted  . Tobacco use disorder 07/13/2017  . Noncompliance with medication regimen 07/13/2017  . Acute ischemic right MCA stroke ([place]) 07/13/2017  . Benign essential HTN   . Stroke (cerebrum) ([place]) 07/08/2017  . Middle cerebral artery embolism, right 07/08/2017  . Vitamin B12 deficiency 08/15/2016  . Acute cholecystitis 08/14/2016  . GERD (gastroesophageal reflux disease) 04/24/2015  . Hypertensive urgency 04/23/2015  . Chest pain, unspecified   . Right leg swelling 03/07/2015  . Preventative health care 02/21/2015  . Esophageal reflux   . NSTEMI (non-ST elevated myocardial infarction) ([place]) 01/28/2015  . Nausea without vomiting 12/22/2014  . Dysuria 12/21/2014  . Diarrhea 09/26/2014  . Difficulty swallowing pills 09/26/2014  . Hemorrhoids 06/16/2014  . Elevated AST (SGOT) 03/27/2014  . Lumbar herniated disc 03/27/2014  . TIA (transient ischemic attack) 02/19/2014  . History of ischemic left PCA stroke 02/19/2014  . Palpitations 01/27/2014  . Depression 08/15/2013  . Long term current use of anticoagulant therapy 08/10/2013  . Gastritis and duodenitis 08/03/2013  . Diverticulosis 08/03/2013  . Fatty liver 08/03/2013  . Esophagitis 08/03/2013  . Hiatal hernia 08/03/2013  . Apical mural thrombus 08/02/2013  . GI bleeding 06/04/2012  . Hypokalemia 06/04/2012  . Antithrombin III deficiency ([place]) 06/04/2012  . Paresthesias 06/29/2011  . Hyperlipemia 06/07/2009  . Essential hypertension 05/17/2009  . OBESITY-MORBID (>100') 03/13/2008  .  CAD S/P  BMS to proximal LAD 2009-patent 01/29/15 06/28/2007    Past Medical History:  Past Medical History:  Diagnosis Date  . Anemia   . Antithrombin III deficiency ([place])    ?pt not sure if true diagnosis  . Anxiety   . Apical mural thrombus   . Blood transfusion   . Cholecystitis 07/2016  . Coronary artery disease    a. apical LAD infarction '00. b. NSTEMI s/p BMS to prox LAD '09. c. Cath 01/2015: stable LAD stent, otherwise minimal nonobstructive CAD.  Marland Kitchen GERD (gastroesophageal reflux disease)   . Hyperlipidemia   . Hypertension   . Myocardial infarction ([place])   . Noncompliance with medication regimen    a. h/o noncompliance with med regimen (previous running out of Coumadin).  . Stroke ([place])    assoc with short term memory loss and right peripheral vision loss; age 45   . TIA (transient ischemic attack) 2010  . Tobacco abuse    Past Surgical History:  Past Surgical History:  Procedure Laterality Date  . CARDIAC CATHETERIZATION    . CARDIAC CATHETERIZATION N/A 01/29/2015   Procedure: Left Heart Cath and Coronary Angiography;  Surgeon: Peter M Martinique, MD;  Location: [facility];  Service: Cardiovascular;  Laterality: N/A;  . CHOLECYSTECTOMY N/A 08/14/2016   Procedure: LAPAROSCOPIC CHOLECYSTECTOMY;  Surgeon: Greer Pickerel, MD;  Location: [facility];  Service: General;  Laterality: N/A;  . COLONOSCOPY N/A 03/29/2014   Procedure: COLONOSCOPY;  Surgeon: Inda Castle, MD;  Location: [facility];  Service: Endoscopy;  Laterality: N/A;  . CORONARY ANGIOPLASTY    . ESOPHAGOGASTRODUODENOSCOPY N/A 06/09/2012   Procedure: ESOPHAGOGASTRODUODENOSCOPY (EGD);  Surgeon: Beryle Beams, MD;  Location: [facility];  Service: Endoscopy;  Laterality: N/A;  . ESOPHAGOGASTRODUODENOSCOPY N/A 08/02/2013   Procedure: ESOPHAGOGASTRODUODENOSCOPY (EGD);  Surgeon: Ladene Artist, MD;  Location: [facility] ENDOSCOPY;  Service: Endoscopy;  Laterality: N/A;  . IR ANGIO INTRA EXTRACRAN SEL COM CAROTID  INNOMINATE UNI R MOD SED  07/08/2017  . LEFT HEART CATHETERIZATION WITH CORONARY ANGIOGRAM N/A 12/24/2011   Procedure: LEFT HEART CATHETERIZATION WITH CORONARY ANGIOGRAM;  Surgeon: Burnell Blanks, MD;  Location: [facility] CATH LAB;  Service: Cardiovascular;  Laterality: N/A;  . RADIOLOGY WITH ANESTHESIA N/A 07/08/2017   Procedure: IR WITH ANESTHESIA;  Surgeon: Radiologist, Medication, MD;  Location: [facility];  Service: Radiology;  Laterality: N/A;  . TUBAL LIGATION      Assessment & Plan Clinical Impression: Patient is a 47 y.o. year old female with history of CAD s/p stenting, history of apical mural thrombus with CVA 2016 --residual STM deficits and right field cut, medication non-compliance,  GERD, HTN who was admitted on 07/08/17 with sudden onset of left facial droop and LUE weakness. History taken from chart review and patient. CT head reviewed, unremarkable for acute intracranial process. Cerebral angio revealing occluded superior division mid 1/3 division of R-MCA with extensive collaterals and spontaneous recanalization therefore no endovascular treatment recommended. MRI brain done revealing right lateral frontal and insula early/subacute infarct without hemorrhage.  2D echo showed fixed, medium apical thrombus with mild LVH, EF 55-60% with grade one DD.  Patient transferred to CIR on 07/13/2017 .   Patient currently requires min assist with mobility secondary to muscle weakness, decreased cardiorespiratory endurance, decreased coordination, decreased visual perceptual skills, decreased attention to left, decreased awareness, decreased problem solving, decreased safety awareness and decreased memory,  and decreased standing balance, decreased postural control, hemiplegia and decreased balance strategies.  Prior to hospitalization, patient was independent  with mobility and lived with Family in a House home.  Home access is 2(no rails at front door)Stairs to enter.  Patient will benefit from skilled  PT intervention to maximize safe functional mobility, minimize fall risk and decrease caregiver burden for planned discharge home with intermittent assist.  Anticipate patient will benefit from follow up OP at discharge.  PT - End of Session Activity Tolerance: Tolerates 30+ min activity with multiple rests Endurance Deficit: Yes Endurance Deficit Description: 2/2 generalized weakness PT Assessment Rehab Potential (ACUTE/IP ONLY): Good PT Barriers to Discharge: Decreased caregiver support PT Barriers to Discharge Comments: pt home alone during the day PT Patient demonstrates impairments in the following area(s): Balance;Behavior;Pain;Edema;Perception;Endurance;Safety;Motor PT Transfers Functional Problem(s): Bed Mobility;Floor;Bed to Chair;Car;Furniture PT Locomotion Functional Problem(s): Stairs;Ambulation PT Plan PT Intensity: Minimum of 1-2 x/day ,45 to 90 minutes PT Frequency: 5 out of 7 days PT Duration Estimated Length of Stay: 7-10 days PT Treatment/Interventions: Ambulation/gait training;Cognitive remediation/compensation;Discharge planning;DME/adaptive equipment instruction;Functional mobility training;Pain management;Psychosocial support;Splinting/orthotics;Therapeutic Activities;UE/LE Strength taining/ROM;Visual/perceptual remediation/compensation;UE/LE Coordination activities;Therapeutic Exercise;Stair training;Patient/family education;Neuromuscular re-education;Disease management/prevention;Community reintegration;Balance/vestibular training PT Transfers Anticipated Outcome(s): mod I with LRAD PT Locomotion Anticipated Outcome(s): supervision<>mod I with LRAD PT Recommendation Recommendations for Other Services: Speech consult;Therapeutic Recreation consult Therapeutic Recreation Interventions: Pet therapy;Outing/community reintergration;Kitchen group;Stress management Follow Up Recommendations: Outpatient PT Patient destination: Home Equipment Recommended: To be  determined  Skilled Therapeutic Intervention Patient received in bed & agreeable to tx. PT evaluation initiated and pt educated on ELOS, weekly interdisciplinary team meeting, safety plan, daily therapy schedule, and other various CIR information. Pt completes all mobility activities at an overall close supervision<>steady assist level without AD. Pt completed Berg Balance Test & scored [phone]; educated pt on interpretation of  score, fall risk, and safety recommendations. Patient demonstrates increased fall risk as noted by score of 44/56 on Berg Balance Scale.  (<36= high risk for falls, close to 100%; 37-45 significant >80%; 46-51 moderate >50%; 52-55 lower >25%). At end of session pt left in bed with alarm set & all needs within reach. Please see above/below for additional information.   PT Evaluation Precautions/Restrictions Precautions Precautions: Fall Restrictions Weight Bearing Restrictions: No  General Chart Reviewed: Yes Additional Pertinent History: CAD s/p stenting, CVA (2016) with residual cognitive deficits & R field cut, GERD, HTN, HLD, MI PT Missed Treatment Reason: Not applicable Response to Previous Treatment: Patient with no complaints from previous session. Family/Caregiver Present: No  Pain Pt c/o HA but reports being premedicated.   Home Living/Prior Functioning Home Living Available Help at Discharge: Family;Available PRN/intermittently(home alone during the day, mother & daughters home in the evening after work/school) Type of Home: House Home Access: Stairs to enter [employer] of Steps: 2(no rails at front door) Entrance Stairs-Rails: None Home Layout: Two level;Bed/bath upstairs Alternate Level Stairs-Number of Steps: full flight (14) Alternate Level Stairs-Rails: Left  Lives With: (mother & 2 daughters (55 & 67 y/o)) Prior Function Level of Independence: Independent with basic ADLs;Independent with homemaking with ambulation;Independent with  gait;Independent with transfers  Able to Take Stairs?: Yes Driving: Yes Vocation: Other (comment) 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 Reports blurry vision following this event & has to "rely on my glasses more now". Mild L inattention.    Cognition Overall Cognitive Status: Impaired/Different from baseline Arousal/Alertness: Awake/alert Orientation Level: Oriented X4 Behaviors: Impulsive  Sensation Coordination Gross Motor Movements are Fluid and Coordinated: Yes  Motor  Motor Motor: Hemiplegia Motor - Skilled Clinical Observations: L hemi (mild), generalized weakness   Mobility Bed Mobility Bed Mobility: Rolling Right;Rolling Left;Supine to Sit;Sit to Supine Rolling Right: 6: Modified independent (Device/Increase time) Rolling Left: 6: Modified independent (Device/Increase time) Supine to Sit: 6: Modified independent (Device/Increase time) Sit to Supine: 6: Modified independent (Device/Increase time) Transfers Transfers: Yes Sit to Stand: 5: Supervision Stand to Sit: 4: Min guard  Locomotion  Ambulation Ambulation: Yes Ambulation/Gait Assistance: 4: Min guard Ambulation Distance (Feet): 120 Feet Assistive device: None Gait Gait: Yes Gait Pattern: (decreased gait speed, decreased step length BLE & decreased stride length, minimal heel strike BLE) Stairs / Additional Locomotion Stairs: Yes Stairs Assistance: 4: Min guard Stair Management Technique: One rail Left Number of Stairs: (12) Height of Stairs: 6(inches) Ramp: 4: Min assist(ambulatory without AD) Wheelchair Mobility Wheelchair Mobility: Yes, BLE & RUE (attempted with BUE but with difficulty with LUE) x 50 ft with supervision  Balance Balance Balance Assessed: Yes Standardized Balance  Assessment Standardized Balance Assessment: Berg Balance Test Berg Balance Test Sit to Stand: Able to stand without using hands and stabilize independently Standing Unsupported: Able to stand 2 minutes with supervision Sitting with Back Unsupported but Feet Supported on Floor or Stool: Able to sit safely and securely 2 minutes Stand to Sit: Sits safely with minimal use of hands Transfers: Able to transfer safely, minor use of hands Standing Unsupported with Eyes Closed: Able to stand 10 seconds with supervision Standing Ubsupported with Feet Together: Able to place feet together independently and stand for 1 minute with supervision From Standing, Reach Forward with Outstretched Arm: Can reach  confidently >25 cm (10") From Standing Position, Pick up Object from Floor: Able to pick up shoe, needs supervision From Standing Position, Turn to Look Behind Over each Shoulder: Looks behind one side only/other side shows less weight shift Turn 360 Degrees: Able to turn 360 degrees safely but slowly Standing Unsupported, Alternately Place Feet on Step/Stool: Able to complete 4 steps without aid or supervision Standing Unsupported, One Foot in Front: Able to place foot tandem independently and hold 30 seconds Standing on One Leg: Tries to lift leg/unable to hold 3 seconds but remains standing independently Total Score: 44  See Function Navigator for Current Functional Status.   Refer to Care Plan for Long Term Goals  Recommendations for other services: Therapeutic Recreation  Pet therapy, Kitchen group, Stress management and Outing/community reintegration  Discharge Criteria: Patient will be discharged from PT if patient refuses treatment 3 consecutive times without medical reason, if treatment goals not met, if there is a change in medical status, if patient makes no progress towards goals or if patient is discharged from hospital.  The above assessment, treatment plan, treatment alternatives and  goals were discussed and mutually agreed upon: by patient  Waunita Schooner 07/14/2017, 4:14 PM

## 2017-07-14 NOTE — Progress Notes (Signed)
Patient information reviewed and entered into eRehab system by Maricella Filyaw, RN, CRRN, PPS Coordinator.  Information including medical coding and functional independence measure will be reviewed and updated through discharge.    

## 2017-07-14 NOTE — Evaluation (Signed)
Speech Language Pathology Assessment and Plan  Patient Details  Name: Lindsey [name] MRN: [id number] Date of Birth: 1970-07-19  SLP Diagnosis: Dysphagia  Rehab Potential: Good ELOS: 7-10 days    Today's Date: 07/14/2017 SLP Individual Time: 1300-1400 SLP Individual Time Calculation (min): 60 min   Problem List:  Patient Active Problem List   Diagnosis Date Noted  . Tobacco use disorder 07/13/2017  . Noncompliance with medication regimen 07/13/2017  . Acute ischemic right MCA stroke ([place]) 07/13/2017  . Benign essential HTN   . Stroke (cerebrum) ([place]) 07/08/2017  . Middle cerebral artery embolism, right 07/08/2017  . Vitamin B12 deficiency 08/15/2016  . Acute cholecystitis 08/14/2016  . GERD (gastroesophageal reflux disease) 04/24/2015  . Hypertensive urgency 04/23/2015  . Chest pain, unspecified   . Right leg swelling 03/07/2015  . Preventative health care 02/21/2015  . Esophageal reflux   . NSTEMI (non-ST elevated myocardial infarction) ([place]) 01/28/2015  . Nausea without vomiting 12/22/2014  . Dysuria 12/21/2014  . Diarrhea 09/26/2014  . Difficulty swallowing pills 09/26/2014  . Hemorrhoids 06/16/2014  . Elevated AST (SGOT) 03/27/2014  . Lumbar herniated disc 03/27/2014  . TIA (transient ischemic attack) 02/19/2014  . History of ischemic left PCA stroke 02/19/2014  . Palpitations 01/27/2014  . Depression 08/15/2013  . Long term current use of anticoagulant therapy 08/10/2013  . Gastritis and duodenitis 08/03/2013  . Diverticulosis 08/03/2013  . Fatty liver 08/03/2013  . Esophagitis 08/03/2013  . Hiatal hernia 08/03/2013  . Apical mural thrombus 08/02/2013  . GI bleeding 06/04/2012  . Hypokalemia 06/04/2012  . Antithrombin III deficiency ([place]) 06/04/2012  . Paresthesias 06/29/2011  . Hyperlipemia 06/07/2009  . Essential hypertension 05/17/2009  . OBESITY-MORBID (>100') 03/13/2008  . CAD S/P  BMS to proximal LAD 2009-patent 01/29/15 06/28/2007   Past Medical  History:  Past Medical History:  Diagnosis Date  . Anemia   . Antithrombin III deficiency ([place])    ?pt not sure if true diagnosis  . Anxiety   . Apical mural thrombus   . Blood transfusion   . Cholecystitis 07/2016  . Coronary artery disease    a. apical LAD infarction '00. b. NSTEMI s/p BMS to prox LAD '09. c. Cath 01/2015: stable LAD stent, otherwise minimal nonobstructive CAD.  Marland Kitchen GERD (gastroesophageal reflux disease)   . Hyperlipidemia   . Hypertension   . Myocardial infarction ([place])   . Noncompliance with medication regimen    a. h/o noncompliance with med regimen (previous running out of Coumadin).  . Stroke ([place])    assoc with short term memory loss and right peripheral vision loss; age 79   . TIA (transient ischemic attack) 2010  . Tobacco abuse    Past Surgical History:  Past Surgical History:  Procedure Laterality Date  . CARDIAC CATHETERIZATION    . CARDIAC CATHETERIZATION N/A 01/29/2015   Procedure: Left Heart Cath and Coronary Angiography;  Surgeon: Peter M Martinique, MD;  Location: [facility];  Service: Cardiovascular;  Laterality: N/A;  . CHOLECYSTECTOMY N/A 08/14/2016   Procedure: LAPAROSCOPIC CHOLECYSTECTOMY;  Surgeon: Greer Pickerel, MD;  Location: [facility];  Service: General;  Laterality: N/A;  . COLONOSCOPY N/A 03/29/2014   Procedure: COLONOSCOPY;  Surgeon: Inda Castle, MD;  Location: [place];  Service: Endoscopy;  Laterality: N/A;  . CORONARY ANGIOPLASTY    . ESOPHAGOGASTRODUODENOSCOPY N/A 06/09/2012   Procedure: ESOPHAGOGASTRODUODENOSCOPY (EGD);  Surgeon: Beryle Beams, MD;  Location: [facility];  Service: Endoscopy;  Laterality: N/A;  . ESOPHAGOGASTRODUODENOSCOPY N/A 08/02/2013  Procedure: ESOPHAGOGASTRODUODENOSCOPY (EGD);  Surgeon: Ladene Artist, MD;  Location: [facility] ENDOSCOPY;  Service: Endoscopy;  Laterality: N/A;  . IR ANGIO INTRA EXTRACRAN SEL COM CAROTID INNOMINATE UNI R MOD SED  07/08/2017  . LEFT HEART CATHETERIZATION WITH CORONARY  ANGIOGRAM N/A 12/24/2011   Procedure: LEFT HEART CATHETERIZATION WITH CORONARY ANGIOGRAM;  Surgeon: Burnell Blanks, MD;  Location: [facility] CATH LAB;  Service: Cardiovascular;  Laterality: N/A;  . RADIOLOGY WITH ANESTHESIA N/A 07/08/2017   Procedure: IR WITH ANESTHESIA;  Surgeon: Radiologist, Medication, MD;  Location: [place];  Service: Radiology;  Laterality: N/A;  . TUBAL LIGATION      Assessment / Plan / Recommendation Clinical Impression Lindsey [name] is a 47 year old female with history of CAD s/p stenting, history of apical mural thrombus with CVA 2016 --residual STM deficits and right field cut, medication non-compliance, GERD, HTN who was admitted on 07/08/17 with sudden onset of left facial droop and LUE weakness. History taken from chart review and patient. CT head reviewed, unremarkable for acute intracranial process. Cerebral angio revealing occluded superior division mid 1/3 division of R-MCA with extensive collaterals and spontaneous recanalization therefore no endovascular treatment recommended. MRI brain done revealing right lateral frontal and insula early/subacute infarct without hemorrhage. 2D echo showed fixed, medium apical thrombus with mild LVH, EF 55-60% with grade one DD. Patient transferred to CIR on 07/13/2017 .  Pt admitted to CIR on 07/13/2017 and evaluated for cognitive linguistic skills on 06/17/2017.Pt presents with minimum-supervision cognitive impairment in the areas of short term recall, mildly complex problem solving, higher level attention, left scanning and emergent awareness, which was further supported by subsections of ALFA on money and medication management and Cognistat with only mild impairment in reasoning noted. Pt presents with left facial droop impacting salvia management and slight left buccal pocketing, however required supervision A verbal cues, close to Mod I. Pt demonstrated no overt s/s aspiration of regular textured foods and thin liquids via straw/cup.  Pt presents with 90% intelligibility in conversation required supervision A  verbal cues to Mod I ability, for intelligibility strategies due to left side motor weakness. Pt would benefit from skilled ST services in order to maximize functional independence and reduce burden of care prior to discharge.    Skilled Therapeutic Interventions          Skilled ST services focused on cognitive skills. SLP facilitated medication and money management utilizing ALFA, requiring min- supervision A verbal cues for problem solving and error awarenes . Pt demonstrated ability to recall 4 words from [place] at the end of the session Mod I.  Pt was left in room with call bell within reach. Reccomend to continue skilled ST services.    SLP Assessment  Patient will need skilled Speech Lanaguage Pathology Services during CIR admission    Recommendations  SLP Diet Recommendations: Thin Liquid Administration via: Straw;Cup Medication Administration: Whole meds with liquid Supervision: Patient able to self feed Compensations: Slow rate;Small sips/bites;Lingual sweep for clearance of pocketing Postural Changes and/or Swallow Maneuvers: Seated upright 90 degrees Oral Care Recommendations: Oral care BID Patient destination: Home Follow up Recommendations: 24 hour supervision/assistance;Other (comment);Home Health SLP;Outpatient SLP(HH or outpatient if not at Mod I due to short ELOS) Equipment Recommended: None recommended by SLP    SLP Frequency 3 to 5 out of 7 days   SLP Duration  SLP Intensity  SLP Treatment/Interventions 7-10 days  Minumum of 1-2 x/day, 30 to 90 minutes  Dysphagia/aspiration precaution training    Pain Pain  Assessment Pain Score: 0-No pain  Prior Functioning Cognitive/Linguistic Baseline: Within functional limits Type of Home: House  Lives With: Family Available Help at Discharge: Family;Available PRN/intermittently Vocation: Part time  employment(cashier)  Function:  Eating Eating   Modified Consistency Diet: No Eating Assist Level: More than reasonable amount of time;Swallowing techniques: self managed           Cognition Comprehension Comprehension assist level: Understands complex 90% of the time/cues 10% of the time  Expression   Expression assist level: Expresses basic needs/ideas: With no assist  Social Interaction Social Interaction assist level: Interacts appropriately with others with medication or extra time (anti-anxiety, antidepressant).  Problem Solving Problem solving assist level: Solves basic problems with no assist;Solves complex 90% of the time/cues < 10% of the time  Memory Memory assist level: Recognizes or recalls 90% of the time/requires cueing < 10% of the time   Short Term Goals: Week 1: SLP Short Term Goal 1 (Week 1): Pt will recall, complex novel and daily information with Mod I use of compensatory strategies. SLP Short Term Goal 2 (Week 1): Pt will demonstrate selective attention during functional tasks in modertately distracting environments at Mod I. SLP Short Term Goal 3 (Week 1): Pt will demonstrate fucntional problem solving for semi-complex tasks at Mod I. SLP Short Term Goal 4 (Week 1): Pt will self-monitor and correct functional errors at Mod I. SLP Short Term Goal 5 (Week 1): Pt will scan left of midline during functional task at Mod I. SLP Short Term Goal 6 (Week 1): Pt will consume regular textured soilds and thin liquids with Mod I use of swallow strategies for oral clearance.  Refer to Care Plan for Long Term Goals  Recommendations for other services: None   Discharge Criteria: Patient will be discharged from SLP if patient refuses treatment 3 consecutive times without medical reason, if treatment goals not met, if there is a change in medical status, if patient makes no progress towards goals or if patient is discharged from hospital.  The above assessment, treatment plan,  treatment alternatives and goals were discussed and mutually agreed upon: by patient  Amalea Ottey  [facility] 07/14/2017, 4:34 PM

## 2017-07-14 NOTE — Progress Notes (Signed)
Subjective/Complaints:   Objective: Vital Signs: Blood pressure 127/87, pulse 62, temperature 98.2 F (36.8 C), temperature source Oral, resp. rate 15, weight 80.5 kg (177 lb 7.5 oz), SpO2 100 %. No results found. Results for orders placed or performed during the hospital encounter of 07/13/17 (from the past 72 hour(s))  CBC WITH DIFFERENTIAL     Status: Abnormal   Collection Time: 07/14/17  5:20 AM  Result Value Ref Range   WBC 6.7 4.0 - 10.5 K/uL   RBC 3.71 (L) 3.87 - 5.11 MIL/uL   Hemoglobin 10.6 (L) 12.0 - 15.0 g/dL   HCT 33.4 (L) 36.0 - 46.0 %   MCV 90.0 78.0 - 100.0 fL   MCH 28.6 26.0 - 34.0 pg   MCHC 31.7 30.0 - 36.0 g/dL   RDW 13.2 11.5 - 15.5 %   Platelets 256 150 - 400 K/uL   Neutrophils Relative % 54 %   Neutro Abs 3.6 1.7 - 7.7 K/uL   Lymphocytes Relative 33 %   Lymphs Abs 2.2 0.7 - 4.0 K/uL   Monocytes Relative 10 %   Monocytes Absolute 0.7 0.1 - 1.0 K/uL   Eosinophils Relative 3 %   Eosinophils Absolute 0.2 0.0 - 0.7 K/uL   Basophils Relative 0 %   Basophils Absolute 0.0 0.0 - 0.1 K/uL    Comment: Performed at [facility], [address] [place], [place] [address]  Comprehensive metabolic panel     Status: Abnormal   Collection Time: 07/14/17  5:20 AM  Result Value Ref Range   Sodium 140 135 - 145 mmol/L   Potassium 3.8 3.5 - 5.1 mmol/L   Chloride 107 101 - 111 mmol/L   CO2 24 22 - 32 mmol/L   Glucose, Bld 95 65 - 99 mg/dL   BUN 7 6 - 20 mg/dL   Creatinine, Ser 0.75 0.44 - 1.00 mg/dL   Calcium 8.8 (L) 8.9 - 10.3 mg/dL   Total Protein 5.5 (L) 6.5 - 8.1 g/dL   Albumin 2.7 (L) 3.5 - 5.0 g/dL   AST 20 15 - 41 U/L   ALT 12 (L) 14 - 54 U/L   Alkaline Phosphatase 71 38 - 126 U/L   Total Bilirubin 0.6 0.3 - 1.2 mg/dL   GFR calc non Af Amer >60 >60 mL/min   GFR calc Af Amer >60 >60 mL/min    Comment: (NOTE) The eGFR has been calculated using the CKD EPI equation. This calculation has not been validated in all clinical situations. eGFR's  persistently <60 mL/min signify possible Chronic Kidney Disease.    Anion gap 9 5 - 15    Comment: Performed at [facility] [address] [place], [place] [address]     HEENT: normal Cardio: RRR and no murmur Resp: CTA B/L and Unlabored GI: BS positive and NT, ND Extremity:  No Edema Skin:   Intact Neuro: Alert/Oriented, Normal Sensory, Abnormal Motor 3/5 Left Del, Bi, Tri , grip, 4/5 L HF, KE ADF and Abnormal FMC Ataxic/ dec FMC Musc/Skel:  Other no pain with UE or LE ROM Gen NAD   Assessment/Plan: 1. 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 Physiatrist is providing close team supervision and 24 hour management of active medical problems listed below. Physiatrist and rehab team continue to assess barriers to discharge/monitor patient progress  toward functional and medical goals. FIM:                   Function - Comprehension Comprehension: Auditory Comprehension assist level: Follows complex conversation/direction with extra time/assistive device  Function - Expression Expression: Verbal Expression assist level: Expresses complex ideas: With extra time/assistive device  Function - Social Interaction Social Interaction assist level: Interacts appropriately with others - No medications needed.  Function - Problem Solving Problem solving assist level: Solves complex problems: With extra time  Function - Memory Memory assist level: Complete Independence: No helper Patient normally able to recall (first 3 days only): Current season, Location of own room, Staff names and faces, That he or she is in a hospital  Medical Problem List and Plan: 1.  Deficits with mobility, dysarthria, self-care secondary to right lateral frontal and insula early/subacute infarct MCA  distribution Initial evals today    2.  Apical mural thrombus/DVT prophylaxis/Anticoagulation: Pharmaceutical: Xarelto Per Neuro also on clopidigrel 3. Pain Management: tylenol pron 4. Mood: LCSW to follow for evaluation and support.  5. Neuropsych: This patient is capable of making decisions on her own behalf. 6. Skin/Wound Care: routine pressure relief measures.  7. Fluids/Electrolytes/Nutrition: Monitor I/O--check lytes in am. 8. HTN: Monitor bid. Continue Coreg bid Vitals:   07/13/17 2025 07/14/17 0446  BP: 101/84 127/87  Pulse: 61 62  Resp:  15  Temp:  98.2 F (36.8 C)  SpO2:  100%   9. Headaches: Report constant piercing/stabbing pain. Increase  patient to BID topamax.     LOS (Days) 1 A FACE TO FACE EVALUATION WAS PERFORMED  Lindsey [name] 07/14/2017, 7:42 AM

## 2017-07-14 NOTE — Progress Notes (Signed)
Ranelle Oyster, MD  Physician  Physical Medicine and Rehabilitation  Consult Note  Signed  Date of Service:  07/12/2017 10:48 AM       Related encounter: ED to Hosp-Admission (Discharged) from 07/08/2017 in [facility]      Signed      Expand All Collapse All       Show:Clear all [x] Manual[x] Template[] Copied  Added by: [x] Ranelle Oyster, MD   [] Hover for details        Physical Medicine and Rehabilitation Consult Reason for Consult: Left-sided weakness Referring Physician: Roda Shutters   HPI: Lindsey [name] is a 47 y.o. female with prior history of stroke and Antithrombin III deficiency with hx of apical mural thrombus who presented on 4/24 with left facial droop and weakness.  Patient went for emergent diagnostic cerebral angiogram the same day by interventional radiology with recanalization of her right MCA nondominant superior division.  MRI on 4/25 revealed right lateral frontal lobe and insula acute/early subacute infarction.  Patient with ongoing deficits in spatial attention to the left as well as memory problem solving balance and left-sided weakness.  Physical medicine rehabilitation was consulted to assess patient for potential inpatient rehab needs.   Review of Systems  Constitutional: Negative for fever.  HENT: Negative for hearing loss.   Eyes: Negative.   Respiratory: Negative for cough.   Cardiovascular: Negative for chest pain.  Gastrointestinal: Negative for heartburn.  Genitourinary: Negative for dysuria.  Musculoskeletal: Negative.   Neurological: Positive for sensory change and weakness.  Psychiatric/Behavioral: Negative.        Past Medical History:  Diagnosis Date  . Anemia   . Antithrombin III deficiency (HCC)    ?pt not sure if true diagnosis  . Anxiety   . Apical mural thrombus   . Blood transfusion   . Cholecystitis 07/2016  . Coronary artery disease    a. apical LAD infarction '00. b. NSTEMI s/p  BMS to prox LAD '09. c. Cath 01/2015: stable LAD stent, otherwise minimal nonobstructive CAD.  Marland Kitchen GERD (gastroesophageal reflux disease)   . Hyperlipidemia   . Hypertension   . Myocardial infarction (HCC)   . Noncompliance with medication regimen    a. h/o noncompliance with med regimen (previous running out of Coumadin).  . Stroke (HCC)    assoc with short term memory loss and right peripheral vision loss; age 59   . TIA (transient ischemic attack) 2010  . Tobacco abuse         Past Surgical History:  Procedure Laterality Date  . CARDIAC CATHETERIZATION    . CARDIAC CATHETERIZATION N/A 01/29/2015   Procedure: Left Heart Cath and Coronary Angiography;  Surgeon: Peter M Swaziland, MD;  Location: [facility] INVASIVE CV LAB;  Service: Cardiovascular;  Laterality: N/A;  . CHOLECYSTECTOMY N/A 08/14/2016   Procedure: LAPAROSCOPIC CHOLECYSTECTOMY;  Surgeon: Gaynelle Adu, MD;  Location: [facility] OR;  Service: General;  Laterality: N/A;  . COLONOSCOPY N/A 03/29/2014   Procedure: COLONOSCOPY;  Surgeon: Louis Meckel, MD;  Location: [facility] ENDOSCOPY;  Service: Endoscopy;  Laterality: N/A;  . CORONARY ANGIOPLASTY    . ESOPHAGOGASTRODUODENOSCOPY N/A 06/09/2012   Procedure: ESOPHAGOGASTRODUODENOSCOPY (EGD);  Surgeon: Theda Belfast, MD;  Location: [facility] ENDOSCOPY;  Service: Endoscopy;  Laterality: N/A;  . ESOPHAGOGASTRODUODENOSCOPY N/A 08/02/2013   Procedure: ESOPHAGOGASTRODUODENOSCOPY (EGD);  Surgeon: Meryl Dare, MD;  Location: [facility] ENDOSCOPY;  Service: Endoscopy;  Laterality: N/A;  . LEFT HEART CATHETERIZATION WITH CORONARY ANGIOGRAM N/A 12/24/2011   Procedure: LEFT HEART CATHETERIZATION  WITH CORONARY ANGIOGRAM;  Surgeon: Kathleene Hazel, MD;  Location: [facility] CATH LAB;  Service: Cardiovascular;  Laterality: N/A;  . RADIOLOGY WITH ANESTHESIA N/A 07/08/2017   Procedure: IR WITH ANESTHESIA;  Surgeon: Radiologist, Medication, MD;  Location: [facility] OR;  Service: Radiology;  Laterality: N/A;  . TUBAL  LIGATION          Family History  Problem Relation Age of Onset  . Heart disease Brother        arrhythmia; died  . Breast cancer Maternal Aunt    Social History:  reports that she has been smoking cigarettes.  She has a 0.20 pack-year smoking history. She has never used smokeless tobacco. She reports that she drinks alcohol. She reports that she has current or past drug history. Drug: Cocaine. Allergies: No Known Allergies       Medications Prior to Admission  Medication Sig Dispense Refill  . acetaminophen (TYLENOL) 500 MG tablet Take 1,000 mg by mouth every 6 (six) hours as needed for headache (pain).    Marland Kitchen amLODipine (NORVASC) 10 MG tablet Take 10 mg by mouth daily.  3  . atorvastatin (LIPITOR) 40 MG tablet Take 1 tablet (40 mg total) by mouth daily. 30 tablet 2  . carvedilol (COREG) 25 MG tablet Take 1 tablet (25 mg total) by mouth 2 (two) times daily with a meal. 60 tablet 1  . clopidogrel (PLAVIX) 75 MG tablet Take 1 tablet (75 mg total) by mouth daily. 30 tablet 1  . doxycycline (VIBRAMYCIN) 100 MG capsule Take 1 capsule (100 mg total) by mouth 2 (two) times daily. 28 capsule 0  . hydrochlorothiazide (HYDRODIURIL) 25 MG tablet Take 25 mg by mouth daily.  3  . HYDROcodone-acetaminophen (NORCO/VICODIN) 5-325 MG tablet Take 1 tablet by mouth every 6 (six) hours as needed. 8 tablet 0  . hydrocortisone (ANUSOL-HC) 2.5 % rectal cream Apply rectally 2 times daily prn 28 g 0  . metroNIDAZOLE (FLAGYL) 500 MG tablet Take 1 tablet (500 mg total) by mouth 2 (two) times daily. 28 tablet 0  . nitroGLYCERIN (NITROSTAT) 0.4 MG SL tablet Place 1 tablet (0.4 mg total) under the tongue every 5 (five) minutes as needed for chest pain (up to 3 doses). 15 tablet 0  . ondansetron (ZOFRAN ODT) 8 MG disintegrating tablet Take 1 tablet (8 mg total) by mouth every 8 (eight) hours as needed for nausea or vomiting. 12 tablet 0  . oxyCODONE (OXY IR/ROXICODONE) 5 MG immediate release tablet Take 1-2  tablets (5-10 mg total) by mouth every 4 (four) hours as needed for moderate pain. 20 tablet 0  . pantoprazole (PROTONIX) 40 MG tablet Take 1 tablet (40 mg total) by mouth daily. 30 tablet 1  . Pseudoephedrine-APAP-DM (DAYQUIL PO) Take 2 capsules by mouth as needed (cough).    . rivaroxaban (XARELTO) 20 MG TABS tablet Take 1 tablet (20 mg total) by mouth daily with supper. 30 tablet 1  . vitamin B-12 (CYANOCOBALAMIN) 1000 MCG tablet Take 1 tablet (1,000 mcg total) by mouth daily. 30 tablet 2  . docusate sodium (COLACE) 100 MG capsule Take 1 capsule (100 mg total) by mouth 2 (two) times daily. (Patient not taking: Reported on 07/09/2017) 10 capsule 0  . methocarbamol (ROBAXIN) 500 MG tablet Take 1 tablet (500 mg total) by mouth 3 (three) times daily. (Patient not taking: Reported on 07/09/2017) 15 tablet 0    Home: Home Living Family/patient expects to be discharged to:: Private residence Living Arrangements: Children Available Help at Discharge:  Family Type of Home: House Home Access: Stairs to enter [employer] of Steps: 2 Entrance Stairs-Rails: Right, Left Home Layout: Two level, Bed/bath upstairs Bathroom Shower/Tub: Engineer, manufacturing systems: Standard Home Equipment: None Additional Comments: Pt insists her family can provide 24 hour assist, although she states her oldest daughter is a Consulting civil engineer and working, and her mother works 7-3 daily.  Youngest daughter is 75 y.o   Lives With: Family(mother and daughters (70 and 28))  Functional History: Prior Function Level of Independence: Independent Comments: works at a [employer] as a Conservation officer, nature, lives with 13 and 53 yo kids Functional Status:  Mobility: Bed Mobility Overal bed mobility: Needs Assistance Bed Mobility: Supine to Sit Supine to sit: Supervision Sit to supine: Supervision General bed mobility comments: required cues and increased time.  Transfers Overall transfer level: Needs assistance Equipment  used: None Transfers: Sit to/from Stand Sit to Stand: Min guard General transfer comment: cues for left hand placement Ambulation/Gait Ambulation/Gait assistance: Min assist Ambulation Distance (Feet): 40 Feet Assistive device: Rolling walker (2 wheeled), 1 person hand held assist(Pt ambulated first with hand held assist then with RW) Gait Pattern/deviations: Step-to pattern, Decreased stride length General Gait Details: right foot tends to drag without verbal cueing, very short stride lenth, some difficulty with sequencing, inattention to left side unless cued Gait velocity interpretation: <1.8 ft/sec, indicate of risk for recurrent falls  ADL: ADL Overall ADL's : Needs assistance/impaired Eating/Feeding: Supervision/ safety, Sitting Eating/Feeding Details (indicate cue type and reason): educated on incorporating use of L hand to open,stabalize containers to promote use (able to hold butter container with left hand and pull lid off with right)  Grooming: Wash/dry hands, Wash/dry face, Min guard, Standing Grooming Details (indicate cue type and reason): assist for sequencing, organization, problem solving (cues to turn water off after she was finished) Upper Body Bathing: Moderate assistance, Sitting Upper Body Bathing Details (indicate cue type and reason): for thoroughness Lower Body Bathing: Moderate assistance, Sit to/from stand Lower Body Bathing Details (indicate cue type and reason): for thoroughness  Upper Body Dressing : Moderate assistance, Sitting Lower Body Dressing: Sit to/from stand, Moderate assistance Lower Body Dressing Details (indicate cue type and reason): requires assist to thread Lt LE through pant leg as well as assist pulling pants over Lt hip and with pulling socks over feet  Toilet Transfer: Minimal assistance, Ambulation, Regular Toilet, Grab bars Toileting- Clothing Manipulation and Hygiene: Min guard, Sitting/lateral lean Toileting - Clothing Manipulation  Details (indicate cue type and reason): unaware of not pulling underwear up on L hip Functional mobility during ADLs: Minimal assistance General ADL Comments: L inattention still noted during in room ambulation.  cont. cues required to scan left and place L hand on RW.    Cognition: Cognition Overall Cognitive Status: Impaired/Different from baseline Arousal/Alertness: Awake/alert Orientation Level: Oriented X4 Attention: Selective Selective Attention: Impaired Memory: Impaired Memory Impairment: Prospective memory, Decreased recall of new information, Storage deficit, Retrieval deficit Awareness: Impaired Awareness Impairment: Intellectual impairment Problem Solving: Impaired Problem Solving Impairment: Functional basic, Verbal basic Safety/Judgment: Impaired Cognition Arousal/Alertness: Awake/alert Behavior During Therapy: Flat affect Overall Cognitive Status: Impaired/Different from baseline Area of Impairment: Attention Current Attention Level: Sustained Memory: Decreased short-term memory Following Commands: Follows one step commands with increased time, Follows one step commands consistently Safety/Judgement: Decreased awareness of safety, Decreased awareness of deficits Awareness: Emergent Problem Solving: Slow processing, Decreased initiation, Difficulty sequencing, Requires verbal cues, Requires tactile cues General Comments: Pt perseverating on stepping over blue diamonds in floor. Marland Kitchen  Pt walked out in hall adn read room number. Less than 1 min later, pt unable to recall room number. Pt unable to find room after walking around hall. Attempted to use signs to problem solve how to find room unsuccessfully. Pt running therapistinto wall on L when not attending to L.   Blood pressure 109/85, pulse 70, temperature 98.7 F (37.1 C), temperature source Oral, resp. rate 16, height 5\' 6"  (1.676 m), weight 78.4 kg (172 lb 13.5 oz), SpO2 100 %. Physical Exam  Constitutional: She is  oriented to person, place, and time. She appears well-developed. No distress.  Eyes: Pupils are equal, round, and reactive to light.  Neck: No thyromegaly present.  Cardiovascular: Normal rate.  Respiratory: Effort normal.  GI: She exhibits no distension.  Neurological: She is alert and oriented to person, place, and time.  Left central 7. Decreased LT left face arm and leg. Left inattention. ? Loss of far left, peripheral vision on confrontation. Fair insight and awareness. RUE and RLE 5/5. LUE and 4+/5. LLE 4 to 4+/5 prox to distal.   Skin: Skin is warm.  Psychiatric: She has a normal mood and affect. Her behavior is normal.    LabResultsLast24Hours       Results for orders placed or performed during the hospital encounter of 07/08/17 (from the past 24 hour(s))  CBC     Status: Abnormal   Collection Time: 07/12/17  9:12 AM  Result Value Ref Range   WBC 5.5 4.0 - 10.5 K/uL   RBC 3.81 (L) 3.87 - 5.11 MIL/uL   Hemoglobin 11.0 (L) 12.0 - 15.0 g/dL   HCT 16.1 (L) 09.6 - 04.5 %   MCV 89.8 78.0 - 100.0 fL   MCH 28.9 26.0 - 34.0 pg   MCHC 32.2 30.0 - 36.0 g/dL   RDW 40.9 81.1 - 91.4 %   Platelets 279 150 - 400 K/uL  Basic metabolic panel     Status: Abnormal   Collection Time: 07/12/17  9:12 AM  Result Value Ref Range   Sodium 139 135 - 145 mmol/L   Potassium 3.7 3.5 - 5.1 mmol/L   Chloride 109 101 - 111 mmol/L   CO2 24 22 - 32 mmol/L   Glucose, Bld 93 65 - 99 mg/dL   BUN 8 6 - 20 mg/dL   Creatinine, Ser 7.82 0.44 - 1.00 mg/dL   Calcium 8.5 (L) 8.9 - 10.3 mg/dL   GFR calc non Af Amer >60 >60 mL/min   GFR calc Af Amer >60 >60 mL/min   Anion gap 6 5 - 15     ImagingResults(Last48hours)  No results found.    Assessment/Plan: Diagnosis: Right MCA infarct 1. Does the need for close, 24 hr/day medical supervision in concert with the patient's rehab needs make it unreasonable for this patient to be served in a less intensive setting?  Yes 2. Co-Morbidities requiring supervision/potential complications: morbid obesity, hx of CVA/TIA 3. Due to bladder management, bowel management, safety, skin/wound care, disease management, medication administration, pain management and patient education, does the patient require 24 hr/day rehab nursing? Yes 4. Does the patient require coordinated care of a physician, rehab nurse, PT (1-2 hrs/day, 5 days/week), OT (1-2 hrs/day, 5 days/week) and SLP (1-2 hrs/day, 5 days/week) to address physical and functional deficits in the context of the above medical diagnosis(es)? Yes Addressing deficits in the following areas: balance, endurance, locomotion, strength, transferring, bowel/bladder control, bathing, dressing, feeding, grooming, toileting, cognition and psychosocial support 5. Can the patient  actively participate in an intensive therapy program of at least 3 hrs of therapy per day at least 5 days per week? Yes 6. The potential for patient to make measurable gains while on inpatient rehab is excellent 7. Anticipated functional outcomes upon discharge from inpatient rehab are modified independent  with PT, modified independent with OT, modified independent with SLP. 8. Estimated rehab length of stay to reach the above functional goals is: 7-10 days 9. Anticipated D/C setting: Home 10. Anticipated post D/C treatments: HH therapy and Outpatient therapy 11. Overall Rehab/Functional Prognosis: excellent  RECOMMENDATIONS: This patient's condition is appropriate for continued rehabilitative care in the following setting: CIR Patient has agreed to participate in recommended program. Yes Note that insurance prior authorization may be required for reimbursement for recommended care.  Comment: Rehab Admissions Coordinator to follow up.  Thanks,  Ranelle Oyster, MD, [place] [facility]  I have personally performed a face to face diagnostic evaluation of this patient. Additionally, I have reviewed and concur  with the physician assistant's documentation above.    Ranelle Oyster, MD 07/12/2017

## 2017-07-14 NOTE — Evaluation (Signed)
Occupational Therapy Assessment and Plan  Patient Details  Name: Lindsey [name] MRN: [id number] Date of Birth: 1970/12/03  OT Diagnosis: ataxia, hemiplegia affecting dominant side and muscle weakness (generalized) Rehab Potential: Rehab Potential (ACUTE ONLY): Excellent ELOS: 7-10 days   Today's Date: 07/14/2017 OT Individual Time: 1100-1200 OT Individual Time Calculation (min): 60 min     Problem List:  Patient Active Problem List   Diagnosis Date Noted  . Tobacco use disorder 07/13/2017  . Noncompliance with medication regimen 07/13/2017  . Acute ischemic right MCA stroke ([place]) 07/13/2017  . Benign essential HTN   . Stroke (cerebrum) ([place]) 07/08/2017  . Middle cerebral artery embolism, right 07/08/2017  . Vitamin B12 deficiency 08/15/2016  . Acute cholecystitis 08/14/2016  . GERD (gastroesophageal reflux disease) 04/24/2015  . Hypertensive urgency 04/23/2015  . Chest pain, unspecified   . Right leg swelling 03/07/2015  . Preventative health care 02/21/2015  . Esophageal reflux   . NSTEMI (non-ST elevated myocardial infarction) ([place]) 01/28/2015  . Nausea without vomiting 12/22/2014  . Dysuria 12/21/2014  . Diarrhea 09/26/2014  . Difficulty swallowing pills 09/26/2014  . Hemorrhoids 06/16/2014  . Elevated AST (SGOT) 03/27/2014  . Lumbar herniated disc 03/27/2014  . TIA (transient ischemic attack) 02/19/2014  . History of ischemic left PCA stroke 02/19/2014  . Palpitations 01/27/2014  . Depression 08/15/2013  . Long term current use of anticoagulant therapy 08/10/2013  . Gastritis and duodenitis 08/03/2013  . Diverticulosis 08/03/2013  . Fatty liver 08/03/2013  . Esophagitis 08/03/2013  . Hiatal hernia 08/03/2013  . Apical mural thrombus 08/02/2013  . GI bleeding 06/04/2012  . Hypokalemia 06/04/2012  . Antithrombin III deficiency ([place]) 06/04/2012  . Paresthesias 06/29/2011  . Hyperlipemia 06/07/2009  . Essential hypertension 05/17/2009  . OBESITY-MORBID (>100')  03/13/2008  . CAD S/P  BMS to proximal LAD 2009-patent 01/29/15 06/28/2007    Past Medical History:  Past Medical History:  Diagnosis Date  . Anemia   . Antithrombin III deficiency ([place])    ?pt not sure if true diagnosis  . Anxiety   . Apical mural thrombus   . Blood transfusion   . Cholecystitis 07/2016  . Coronary artery disease    a. apical LAD infarction '00. b. NSTEMI s/p BMS to prox LAD '09. c. Cath 01/2015: stable LAD stent, otherwise minimal nonobstructive CAD.  Marland Kitchen GERD (gastroesophageal reflux disease)   . Hyperlipidemia   . Hypertension   . Myocardial infarction ([place])   . Noncompliance with medication regimen    a. h/o noncompliance with med regimen (previous running out of Coumadin).  . Stroke ([place])    assoc with short term memory loss and right peripheral vision loss; age 72   . TIA (transient ischemic attack) 2010  . Tobacco abuse    Past Surgical History:  Past Surgical History:  Procedure Laterality Date  . CARDIAC CATHETERIZATION    . CARDIAC CATHETERIZATION N/A 01/29/2015   Procedure: Left Heart Cath and Coronary Angiography;  Surgeon: Peter M Martinique, MD;  Location: [facility];  Service: Cardiovascular;  Laterality: N/A;  . CHOLECYSTECTOMY N/A 08/14/2016   Procedure: LAPAROSCOPIC CHOLECYSTECTOMY;  Surgeon: Greer Pickerel, MD;  Location: [facility];  Service: General;  Laterality: N/A;  . COLONOSCOPY N/A 03/29/2014   Procedure: COLONOSCOPY;  Surgeon: Inda Castle, MD;  Location: [facility];  Service: Endoscopy;  Laterality: N/A;  . CORONARY ANGIOPLASTY    . ESOPHAGOGASTRODUODENOSCOPY N/A 06/09/2012   Procedure: ESOPHAGOGASTRODUODENOSCOPY (EGD);  Surgeon: Beryle Beams, MD;  Location: [facility];  Service: Endoscopy;  Laterality: N/A;  . ESOPHAGOGASTRODUODENOSCOPY N/A 08/02/2013   Procedure: ESOPHAGOGASTRODUODENOSCOPY (EGD);  Surgeon: Ladene Artist, MD;  Location: [facility] ENDOSCOPY;  Service: Endoscopy;  Laterality: N/A;  . IR ANGIO INTRA EXTRACRAN SEL COM  CAROTID INNOMINATE UNI R MOD SED  07/08/2017  . LEFT HEART CATHETERIZATION WITH CORONARY ANGIOGRAM N/A 12/24/2011   Procedure: LEFT HEART CATHETERIZATION WITH CORONARY ANGIOGRAM;  Surgeon: Burnell Blanks, MD;  [facility] CATH LAB;  Service: Cardiovascular;  Laterality: N/A;  . RADIOLOGY WITH ANESTHESIA N/A 07/08/2017   Procedure: IR WITH ANESTHESIA;  Surgeon: Radiologist, Medication, MD;  Location: [facility];  Service: Radiology;  Laterality: N/A;  . TUBAL LIGATION      Assessment & Plan Clinical Impression: Lindsey [name] is a 47 year old female with history of CAD s/p stenting, history of apical mural thrombus with CVA 2016 --residual STM deficits and right field cut, medication non-compliance,  GERD, HTN who was admitted on 07/08/17 with sudden onset of left facial droop and LUE weakness. History taken from chart review and patient. CT head reviewed, unremarkable for acute intracranial process. Cerebral angio revealing occluded superior division mid 1/3 division of R-MCA with extensive collaterals and spontaneous recanalization therefore no endovascular treatment recommended. MRI brain done revealing right lateral frontal and insula early/subacute infarct without hemorrhage.    2D echo showed fixed, medium apical thrombus with mild LVH, EF 55-60% with grade one DD. Patient transferred to CIR on 07/13/2017 .    Patient currently requires min with basic self-care skills secondary to muscle weakness, decreased cardiorespiratoy endurance and decreased standing balance, decreased postural control, hemiplegia and decreased balance strategies.  Prior to hospitalization, patient could complete ADLs/IADLs with independent .  Patient will benefit from skilled intervention to decrease level of assist with basic self-care skills, increase independence with basic self-care skills and increase level of independence with iADL prior to discharge home with care partner.  Anticipate patient will require intermittent  supervision and follow up home health.      Skilled Therapeutic Intervention Pt seen for OT eval and ADL bathing/dressing session. Pt asleep in supine upon arrival, easily awoken and agreeable to tx session. She ambulated throughout session with HHA, VCs to lengthen stride length due to shuffling steps.  She bathed seated on 3-1 BSC in shower, standing with support of grab bars to complete pericare/buttock hygiene and VCs to attend to L side of body. Pt able to use B UEs at independent level without cuing.  She returned to chair to stress, steadying assist when pulling pants up. She completed grooming tasks standing at sink initially with steadying assist progressing to supervision. Pt returned to recliner at end of session, left set-up with lunch and all needs in reach.  Education provided throughout session regarding role of OT, POC, OT goals, and d/c planning.   OT Evaluation Precautions/Restrictions  Precautions Precautions: Fall Restrictions Weight Bearing Restrictions: No General Chart Reviewed: Yes Pain Pain Assessment Pain Scale: 0-10 Pain Score: 5  Pain Type: Acute pain Pain Location: Head Pain Orientation: Right Pain Descriptors / Indicators: Headache;Sharp Pain Frequency: Constant Pain Onset: On-going Pain Intervention(s): Shower, repositioned, distraction (reports having received pain meds prior to tx session) Home Living/Prior Functioning Home Living Available Help at Discharge: Family Type of Home: House Home Access: Stairs to enter Technical brewer of Steps: 2 Entrance Stairs-Rails: Right, Left Home Layout: Two level, Bed/bath upstairs Bathroom Shower/Tub: Chiropodist: Standard  Lives With: Family(Mother, 2 daughters age 80 and 2)  IADL History Homemaking Responsibilities: Yes Current License: Yes Mode of Transportation: Car Occupation: Full time employment Type of Occupation: Works 2nd shift at [employer] with basic ADLs, Independent with homemaking with ambulation, Independent with gait  Able to Take Stairs?: Yes Driving: Yes Vision Baseline Vision/History: Wears glasses Wears Glasses: Reading only Vision Assessment?: Yes Eye Alignment: Within Functional Limits Ocular Range of Motion: Within Functional Limits Tracking/Visual Pursuits: Able to track stimulus in all quads without difficulty Convergence: Within functional limits Visual Fields: No apparent deficits Perception  Perception: Within Functional Limits Praxis Praxis: Intact Cognition Overall Cognitive Status: Within Functional Limits for tasks assessed Arousal/Alertness: Awake/alert Orientation Level: Person;Place;Situation Person: Oriented Place: Oriented Situation: Oriented Year: 2019 Month: April Day of Week: Correct Memory: Appears intact Immediate Memory Recall: Sock;Blue;Bed Memory Recall: Sock;Blue;Bed Memory Recall Sock: Without Cue Memory Recall Blue: Without Cue Memory Recall Bed: Without Cue Awareness: Appears intact Problem Solving: Appears intact Safety/Judgment: Appears intact Sensation Sensation Light Touch: Appears Intact Hot/Cold: Appears Intact Proprioception: Appears Intact Coordination Gross Motor Movements are Fluid and Coordinated: No Fine Motor Movements are Fluid and Coordinated: Yes Coordination and Movement Description: Mild L hemiplegia Finger Nose Finger Test: WFL B UEs Motor  Motor Motor: Hemiplegia Motor - Skilled Clinical Observations: L hemi (mild), generalized weakness Trunk/Postural Assessment  Cervical Assessment Cervical Assessment: Within Functional Limits Thoracic Assessment Thoracic Assessment: Within Functional Limits Lumbar Assessment Lumbar Assessment: Within Functional Limits Postural Control Postural Control: Within Functional Limits  Balance Balance Balance Assessed: Yes Dynamic Sitting Balance Dynamic Sitting  - Balance Support: During functional activity;Feet supported Dynamic Sitting - Level of Assistance: 5: Stand by assistance Static Standing Balance Static Standing - Balance Support: During functional activity;No upper extremity supported Static Standing - Level of Assistance: 5: Stand by assistance;4: Min assist Dynamic Standing Balance Dynamic Standing - Balance Support: During functional activity;No upper extremity supported Dynamic Standing - Level of Assistance: 5: Stand by assistance;4: Min assist Dynamic Standing - Comments: Standing during grooming tasks and LB bathing/dressing tasks Extremity/Trunk Assessment RUE Assessment RUE Assessment: Within Functional Limits LUE Assessment LUE Assessment: Exceptions to WFL(4/5 throughout)   See Function Navigator for Current Functional Status.   Refer to Care Plan for Long Term Goals  Recommendations for other services: Neuropsych and Therapeutic Recreation  Kitchen group, Stress management and Outing/community reintegration   Discharge Criteria: Patient will be discharged from OT if patient refuses treatment 3 consecutive times without medical reason, if treatment goals not met, if there is a change in medical status, if patient makes no progress towards goals or if patient is discharged from hospital.  The above assessment, treatment plan, treatment alternatives and goals were discussed and mutually agreed upon: by patient  Maeli Spacek L 07/14/2017, 12:56 PM

## 2017-07-14 NOTE — Progress Notes (Signed)
Trish Mage, RN  Rehab Admission Coordinator  Physical Medicine and Rehabilitation  PMR Pre-admission  Signed  Date of Service:  07/13/2017 1:30 PM       Related encounter: ED to Hosp-Admission (Discharged) from 07/08/2017 in [facility]      Signed            Show:Clear all [x] Manual[x] Template[x] Copied  Added by: [x] Trish Mage, RN   [] Hover for details   PMR Admission Coordinator Pre-Admission Assessment  Patient: Lindsey [name] is an 47 y.o., female MRN: [id number] DOB: 1971/02/24 Height: 5\' 6"  (167.6 cm) Weight: 78.4 kg (172 lb 13.5 oz)                                                                                                                                                  Insurance Information HMO:      PPO:       PCP:       IPA:       80/20:       OTHER:   PRIMARY: Medicaid [place] access      Policy#: [id number]      Subscriber:  patient CM Name:        Phone#:       Fax#:   Pre-Cert#:        Employer: [employer] Benefits:  Phone #: [phone]     Name:  Automated Eff. Date: Eligible 07/13/17 with coverage code MAFCN     Deduct:        Out of Pocket Max:        Life Max:   CIR:        SNF:   Outpatient:       Co-Pay:   Home Health:        Co-Pay:   DME:       Co-Pay:   Providers:    Medicaid Application Date:        Case Manager:   Disability Application Date:        Case Worker:    Emergency Contact Information         Contact Information    Name Relation Home Work Mobile   Blanchard Mother [phone]     Alesia Richards Spouse        Current Medical History  Patient Admitting Diagnosis:  R MCA infarct  History of Present Illness: A 47 y.o.femalewith prior history of stroke and Antithrombin III deficiency withhx ofapical mural thrombus who presented on 4/24 with left facial droop and weakness. Patient went foremergent diagnostic cerebral angiogram the same day by  interventional radiology with recanalization of her right MCA nondominant superior division. MRI on 4/25 revealed right lateral frontal lobe and insula acute/early subacute infarction.Patient with ongoing deficits in spatial attention to the left as well as memory problem solving balance  and left-sided weakness. Physical medicine rehabilitation was consulted to assess patient for potential inpatient rehab needs.  Total: 4=NIH  Past Medical History      Past Medical History:  Diagnosis Date  . Anemia   . Antithrombin III deficiency (HCC)    ?pt not sure if true diagnosis  . Anxiety   . Apical mural thrombus   . Blood transfusion   . Cholecystitis 07/2016  . Coronary artery disease    a. apical LAD infarction '00. b. NSTEMI s/p BMS to prox LAD '09. c. Cath 01/2015: stable LAD stent, otherwise minimal nonobstructive CAD.  Marland Kitchen GERD (gastroesophageal reflux disease)   . Hyperlipidemia   . Hypertension   . Myocardial infarction (HCC)   . Noncompliance with medication regimen    a. h/o noncompliance with med regimen (previous running out of Coumadin).  . Stroke (HCC)    assoc with short term memory loss and right peripheral vision loss; age 56   . TIA (transient ischemic attack) 2010  . Tobacco abuse     Family History  family history includes Breast cancer in her maternal aunt; Heart disease in her brother.  Prior Rehab/Hospitalizations: Patient states she had her gallbladder removed in 06/18.  Has the patient had major surgery during 100 days prior to admission? No  Current Medications   Current Facility-Administered Medications:  .  0.9 %  sodium chloride infusion, , Intravenous, Continuous, Marvel Plan, MD, Last Rate: 40 mL/hr at 07/10/17 0607, 1,000 mL at 07/10/17 0607 .  acetaminophen (TYLENOL) tablet 650 mg, 650 mg, Oral, Q4H PRN, 650 mg at 07/10/17 0608 **OR** acetaminophen (TYLENOL) solution 650 mg, 650 mg, Per Tube, Q4H PRN **OR** acetaminophen  (TYLENOL) suppository 650 mg, 650 mg, Rectal, Q4H PRN, Caryl Pina, MD .  atorvastatin (LIPITOR) tablet 40 mg, 40 mg, Oral, Daily, Caryl Pina, MD, 40 mg at 07/13/17 0921 .  butalbital-acetaminophen-caffeine (FIORICET, ESGIC) 50-325-40 MG per tablet 1 tablet, 1 tablet, Oral, Q6H PRN, Metzger-Cihelka, Desiree, NP, 1 tablet at 07/13/17 0921 .  carvedilol (COREG) tablet 25 mg, 25 mg, Oral, BID WC, Caryl Pina, MD, 25 mg at 07/13/17 1056 .  clopidogrel (PLAVIX) tablet 75 mg, 75 mg, Oral, Daily, Marvel Plan, MD, 75 mg at 07/13/17 0921 .  HYDROcodone-acetaminophen (NORCO/VICODIN) 5-325 MG per tablet 1 tablet, 1 tablet, Oral, Q6H PRN, Caryl Pina, MD, 1 tablet at 07/13/17 0800 .  hydrocortisone (ANUSOL-HC) 2.5 % rectal cream, , Rectal, PRN, Caryl Pina, MD .  nitroGLYCERIN (NITROSTAT) SL tablet 0.4 mg, 0.4 mg, Sublingual, Q5 min PRN, Caryl Pina, MD .  pantoprazole (PROTONIX) EC tablet 40 mg, 40 mg, Oral, Daily, Marvel Plan, MD, 40 mg at 07/13/17 0921 .  rivaroxaban (XARELTO) tablet 20 mg, 20 mg, Oral, Q supper, Caryl Pina, MD, 20 mg at 07/12/17 1859 .  senna-docusate (Senokot-S) tablet 1 tablet, 1 tablet, Oral, QHS PRN, Caryl Pina, MD .  vitamin B-12 (CYANOCOBALAMIN) tablet 1,000 mcg, 1,000 mcg, Oral, Daily, Caryl Pina, MD, 1,000 mcg at 07/13/17 1610  Patients Current Diet: Fall precautions Diet Heart Room service appropriate? Yes; Fluid consistency: Thin  Precautions / Restrictions Precautions Precautions: Fall Precaution Comments: left inattention Restrictions Weight Bearing Restrictions: No   Has the patient had 2 or more falls or a fall with injury in the past year?No  Prior Activity Level Community (5-7x/wk): Worked PT at [employer] 6 days a week, was driving, went out daily.  Home Assistive Devices / Equipment Home Equipment: None  Prior Device Use: Indicate devices/aids used  by the patient prior to current illness, exacerbation or injury?  None  Prior Functional Level Prior Function Level of Independence: Independent Comments: works at a [employer] as a Conservation officer, nature, lives with 13 and 53 yo kids  Self Care: Did the patient need help bathing, dressing, using the toilet or eating?  Independent  Indoor Mobility: Did the patient need assistance with walking from room to room (with or without device)? Independent  Stairs: Did the patient need assistance with internal or external stairs (with or without device)? Independent  Functional Cognition: Did the patient need help planning regular tasks such as shopping or remembering to take medications? Independent  Current Functional Level Cognition  Arousal/Alertness: Awake/alert Overall Cognitive Status: Impaired/Different from baseline Current Attention Level: Selective Orientation Level: Oriented X4 Following Commands: Follows one step commands with increased time, Follows one step commands consistently, Follows multi-step commands inconsistently Safety/Judgement: Decreased awareness of safety, Decreased awareness of deficits General Comments: Pt perseverating on stepping over blue diamonds in floor. .Pt walked out in hall adn read room number. Less than 1 min later, pt unable to recall room number. Pt unable to find room after walking around hall. Attempted to use signs to problem solve how to find room unsuccessfully. Pt running therapistinto wall on L when not attending to L.  Attention: Selective Selective Attention: Impaired Memory: Impaired Memory Impairment: Prospective memory, Decreased recall of new information, Storage deficit, Retrieval deficit Awareness: Impaired Awareness Impairment: Intellectual impairment Problem Solving: Impaired Problem Solving Impairment: Functional basic, Verbal basic Safety/Judgment: Impaired    Extremity Assessment (includes Sensation/Coordination)  Upper Extremity Assessment: LUE deficits/detail LUE Deficits / Details: Pt  demonstrates decreased oposition of Lt hand.  She appears to have proprioceptive deficits as assessed through function, and when asks, she states "it feels like its dead".  Demonstrates difficulty pulling on socks with Lt UE and with manipulating objects in Lt UE  LUE Sensation: decreased proprioception LUE Coordination: decreased fine motor  Lower Extremity Assessment: Defer to PT evaluation    ADLs  Overall ADL's : Needs assistance/impaired Eating/Feeding: Supervision/ safety, Sitting Eating/Feeding Details (indicate cue type and reason): educated on incorporating use of L hand to open,stabalize containers to promote use (able to hold butter container with left hand and pull lid off with right)  Grooming: Wash/dry hands, Wash/dry face, Min guard, Standing Grooming Details (indicate cue type and reason): assist for sequencing, organization, problem solving (cues to turn water off after she was finished) Upper Body Bathing: Moderate assistance, Sitting Upper Body Bathing Details (indicate cue type and reason): for thoroughness Lower Body Bathing: Moderate assistance, Sit to/from stand Lower Body Bathing Details (indicate cue type and reason): for thoroughness  Upper Body Dressing : Moderate assistance, Sitting Lower Body Dressing: Sit to/from stand, Moderate assistance Lower Body Dressing Details (indicate cue type and reason): requires assist to thread Lt LE through pant leg as well as assist pulling pants over Lt hip and with pulling socks over feet  Toilet Transfer: Minimal assistance, Ambulation, Regular Toilet, Grab bars Toileting- Clothing Manipulation and Hygiene: Min guard, Sitting/lateral lean Toileting - Clothing Manipulation Details (indicate cue type and reason): unaware of not pulling underwear up on L hip Functional mobility during ADLs: Minimal assistance General ADL Comments: L inattention still noted during in room ambulation.  cont. cues required to scan left and place L  hand on RW.      Mobility  Overal bed mobility: Needs Assistance Bed Mobility: Supine to Sit Supine to sit: Supervision Sit to supine: Supervision  General bed mobility comments: increased time and effort     Transfers  Overall transfer level: Needs assistance Equipment used: Rolling walker (2 wheeled) Transfers: Sit to/from Stand Sit to Stand: Min guard General transfer comment: cues for hand placement and sequencing     Ambulation / Gait / Stairs / Wheelchair Mobility  Ambulation/Gait Ambulation/Gait assistance: Architect (Feet): 100 Feet Assistive device: Rolling walker (2 wheeled) Gait Pattern/deviations: Step-through pattern, Decreased step length - right, Decreased step length - left, Decreased stride length, Shuffle, Decreased dorsiflexion - right, Decreased dorsiflexion - left, Drifts right/left General Gait Details: requires cues to normalize gait pattern as she either marches or shuffles both feet, difficulty with sequencing, cues for attention to L side and for staight line navigation; performed activities such as greeting people on L side and cross midline reaches with L UE in standing as well Gait velocity interpretation: <1.8 ft/sec, indicate of risk for recurrent falls    Posture / Balance Balance Overall balance assessment: Needs assistance Sitting balance-Leahy Scale: Good Standing balance-Leahy Scale: Fair Standing balance comment: LOB when distracted     Special needs/care consideration BiPAP/CPAP No CPM No Continuous Drip IV 0.9% NS 40 mL/hr Dialysis No       Life Vest No Oxygen No Special Bed No Trach Size No Wound Vac (area) No    Skin No                            Bowel mgmt: Last BM 07/12/17 Bladder mgmt: Up to BRP with assistance Diabetic mgmt No    Previous Home Environment Living Arrangements: Children  Lives With: Family(mother and daughters (13 and 15)) Available Help at Discharge: Family Type of Home:  House Home Layout: Two level, Bed/bath upstairs Home Access: Stairs to enter Entrance Stairs-Rails: Right, Left Entrance Stairs-Number of Steps: 2 Bathroom Shower/Tub: Engineer, manufacturing systems: Standard Additional Comments: Pt insists her family can provide 24 hour assist, although she states her oldest daughter is a Consulting civil engineer and working, and her mother works 7-3 daily.  Youngest daughter is 98 y.o   Discharge Living Setting Plans for Discharge Living Setting: House, Lives with (comment)(Lives with mom and 2 daughters ages 4 and 91.) Type of Home at Discharge: House Discharge Home Layout: Two level, 1/2 bath on main level, Bed/bath upstairs Alternate Level Stairs-Number of Steps: 14 Discharge Home Access: Stairs to enter Entrance Stairs-Number of Steps: 1 step entry  Social/Family/Support Systems Patient Roles: Parent, Other (Comment)(Has mom, BF, 2 dtrs ages 69 and 76.) Contact Information: Marylene Land - mother - [phone] Anticipated Caregiver: Self, mom, boyfriend Ability/Limitations of Caregiver: Mom works 7 am to 3 pm.  Boyfriend, Romeo Apple, is on disability and can assist as needed. Caregiver Availability: Intermittent Discharge Plan Discussed with Primary Caregiver: Yes Is Caregiver In Agreement with Plan?: Yes Does Caregiver/Family have Issues with Lodging/Transportation while Pt is in Rehab?: No  Goals/Additional Needs Patient/Family Goal for Rehab: PT/OT/SLP mod I goals Expected length of stay: 7-10 days Cultural Considerations: None Dietary Needs: Heart diet, thin lqiuds Equipment Needs: TBD Pt/Family Agrees to Admission and willing to participate: Yes Program Orientation Provided & Reviewed with Pt/Caregiver Including Roles  & Responsibilities: Yes  Decrease burden of Care through IP rehab admission: N/A  Possible need for SNF placement upon discharge: Not anticipated  Patient Condition: This patient's condition remains as documented in the consult  dated 07/12/17, in which the Rehabilitation Physician determined and documented that the  patient's condition is appropriate for intensive rehabilitative care in an inpatient rehabilitation facility. Will admit to inpatient rehab today.  Preadmission Screen Completed By:  Trish Mage, 07/13/2017 1:38 PM ______________________________________________________________________   Discussed status with Dr. Allena Katz on 07/13/17 at 1338 and received telephone approval for admission today.  Admission Coordinator:  Trish Mage, time 1338/Date 07/13/17             Cosigned by: Marcello Fennel, MD at 07/13/2017 1:41 PM

## 2017-07-15 ENCOUNTER — Inpatient Hospital Stay (HOSPITAL_COMMUNITY): Payer: Medicaid Other | Admitting: Occupational Therapy

## 2017-07-15 ENCOUNTER — Inpatient Hospital Stay (HOSPITAL_COMMUNITY): Payer: Medicaid Other | Admitting: Speech Pathology

## 2017-07-15 ENCOUNTER — Inpatient Hospital Stay (HOSPITAL_COMMUNITY): Payer: Medicare Other | Admitting: Physical Therapy

## 2017-07-15 ENCOUNTER — Encounter (HOSPITAL_COMMUNITY): Payer: Medicaid Other | Admitting: Psychology

## 2017-07-15 DIAGNOSIS — I63511 Cerebral infarction due to unspecified occlusion or stenosis of right middle cerebral artery: Secondary | ICD-10-CM

## 2017-07-15 DIAGNOSIS — F329 Major depressive disorder, single episode, unspecified: Secondary | ICD-10-CM

## 2017-07-15 MED ORDER — SENNOSIDES-DOCUSATE SODIUM 8.6-50 MG PO TABS
1.0000 | ORAL_TABLET | Freq: Two times a day (BID) | ORAL | Status: DC
  Administered 2017-07-15 – 2017-07-21 (×12): 1 via ORAL
  Filled 2017-07-15 (×12): qty 1

## 2017-07-15 NOTE — Progress Notes (Signed)
Physical Therapy Session Note  Patient Details  Name: Lindsey [name] MRN: [id number] Date of Birth: 18-Aug-1970  Today's Date: 07/15/2017 PT Individual Time: 6063-0160 PT Individual Time Calculation (min): 53 min   Short Term Goals: Week 1:  PT Short Term Goal 1 (Week 1): STG = LTG due to short ELOS.  Skilled Therapeutic Interventions/Progress Updates:  Pt received in bed & agreeable to tx. Pt reports ongoing HA but notes being premedicated. Pt ambulates throughout unit without AD with supervision with occasional cuing for increased step length. Addressed postural control and righting reactions with pt standing on rocker board with anterior/posterior and lateral challenges to balance with eyes open and eyes closed; pt with difficulty maintaining eyes closed for further challenge despite max cuing. Pt completed floor transfer with supervision with heavy education regarding situations in which she should call EMS after experiencing fall at home, vs instances in which it's safe to get up.  Pt engaged in high level gait & dual task requiring her to toss ball while ambulating forwards & backwards with steady assist while engaging in conversation; no LOB noted. At end of session pt left in bed with alarm set & all needs within reach.   Recreational therapist present for 2nd half of session.  Therapy Documentation Precautions:  Precautions Precautions: Fall Restrictions Weight Bearing Restrictions: No   See Function Navigator for Current Functional Status.   Therapy/Group: Individual Therapy  Sandi Mariscal 07/15/2017, 10:47 AM

## 2017-07-15 NOTE — Progress Notes (Signed)
LBM 4-28. PRN senna given HS. Kirsteins, MD added senna BID to schedule. No BM at this time.

## 2017-07-15 NOTE — Progress Notes (Signed)
Subjective/Complaints: Amb with OT, small step length Headaches have improved  ROS-  No CP, SOB, N/V/D, no bladder issues, + constipation  Objective: Vital Signs: Blood pressure 120/76, pulse (!) 56, temperature 98.5 F (36.9 C), temperature source Oral, resp. rate 17, weight 80.5 kg (177 lb 7.5 oz), SpO2 100 %. No results found. Results for orders placed or performed during the hospital encounter of 07/13/17 (from the past 72 hour(s))  CBC WITH DIFFERENTIAL     Status: Abnormal   Collection Time: 07/14/17  5:20 AM  Result Value Ref Range   WBC 6.7 4.0 - 10.5 K/uL   RBC 3.71 (L) 3.87 - 5.11 MIL/uL   Hemoglobin 10.6 (L) 12.0 - 15.0 g/dL   HCT 33.4 (L) 36.0 - 46.0 %   MCV 90.0 78.0 - 100.0 fL   MCH 28.6 26.0 - 34.0 pg   MCHC 31.7 30.0 - 36.0 g/dL   RDW 13.2 11.5 - 15.5 %   Platelets 256 150 - 400 K/uL   Neutrophils Relative % 54 %   Neutro Abs 3.6 1.7 - 7.7 K/uL   Lymphocytes Relative 33 %   Lymphs Abs 2.2 0.7 - 4.0 K/uL   Monocytes Relative 10 %   Monocytes Absolute 0.7 0.1 - 1.0 K/uL   Eosinophils Relative 3 %   Eosinophils Absolute 0.2 0.0 - 0.7 K/uL   Basophils Relative 0 %   Basophils Absolute 0.0 0.0 - 0.1 K/uL    Comment: Performed at [facility], [address], [place], [place] [address]  Comprehensive metabolic panel     Status: Abnormal   Collection Time: 07/14/17  5:20 AM  Result Value Ref Range   Sodium 140 135 - 145 mmol/L   Potassium 3.8 3.5 - 5.1 mmol/L   Chloride 107 101 - 111 mmol/L   CO2 24 22 - 32 mmol/L   Glucose, Bld 95 65 - 99 mg/dL   BUN 7 6 - 20 mg/dL   Creatinine, Ser 0.75 0.44 - 1.00 mg/dL   Calcium 8.8 (L) 8.9 - 10.3 mg/dL   Total Protein 5.5 (L) 6.5 - 8.1 g/dL   Albumin 2.7 (L) 3.5 - 5.0 g/dL   AST 20 15 - 41 U/L   ALT 12 (L) 14 - 54 U/L   Alkaline Phosphatase 71 38 - 126 U/L   Total Bilirubin 0.6 0.3 - 1.2 mg/dL   GFR calc non Af Amer >60 >60 mL/min   GFR calc Af Amer >60 >60 mL/min    Comment: (NOTE) The eGFR has been  calculated using the CKD EPI equation. This calculation has not been validated in all clinical situations. eGFR's persistently <60 mL/min signify possible Chronic Kidney Disease.    Anion gap 9 5 - 15    Comment: Performed at [facility] [address] [place], [place] [address]     HEENT: normal Cardio: RRR and no murmur Resp: CTA B/L and Unlabored GI: BS positive and NT, ND Extremity:  No Edema Skin:   Intact Neuro: Alert/Oriented, Normal Sensory, Abnormal Motor 3/5 Left Del, Bi, Tri , grip, 4/5 L HF, KE ADF and Abnormal FMC Ataxic/ dec FMC Musc/Skel:  Other no pain with UE or LE ROM Gen NAD   Assessment/Plan: 1. 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 Physiatrist is providing close team supervision and  24 hour management of active medical problems listed below. Physiatrist and rehab team continue to assess barriers to discharge/monitor patient progress toward functional and medical goals. FIM: Function - Bathing Position: Shower Body parts bathed by patient: Right arm, Right lower leg, Left arm, Left lower leg, Chest, Abdomen, Front perineal area, Buttocks, Right upper leg, Left upper leg, Back Body parts bathed by helper: Back Assist Level: Supervision or verbal cues  Function- Upper Body Dressing/Undressing What is the patient wearing?: Pull over shirt/dress Pull over shirt/dress - Perfomed by patient: Thread/unthread right sleeve, Thread/unthread left sleeve, Put head through opening, Pull shirt over trunk Assist Level: Set up Set up : To obtain clothing/put away Function - Lower Body Dressing/Undressing What is the patient wearing?: Pants, Non-skid slipper socks Position: Sitting EOB Pants- Performed by patient: Thread/unthread right pants leg, Thread/unthread left pants leg, Pull  pants up/down Non-skid slipper socks- Performed by patient: Don/doff right sock, Don/doff left sock Assist for footwear: Supervision/touching assist Assist for lower body dressing: Supervision or verbal cues  Function - Toileting Toileting steps completed by patient: Performs perineal hygiene, Adjust clothing after toileting Toileting steps completed by helper: Adjust clothing prior to toileting Toileting Assistive Devices: Grab bar or rail Assist level: Touching or steadying assistance (Pt.75%)  Function - Air cabin crew transfer assistive device: Grab bar Assist level to toilet: Touching or steadying assistance (Pt > 75%) Assist level from toilet: Touching or steadying assistance (Pt > 75%)  Function - Chair/bed transfer Chair/bed transfer method: Stand pivot Chair/bed transfer assist level: Supervision or verbal cues Chair/bed transfer assistive device: Armrests Chair/bed transfer details: Verbal cues for technique  Function - Locomotion: Wheelchair Will patient use wheelchair at discharge?: No Max wheelchair distance: 50 ft  Assist Level: Supervision or verbal cues Assist Level: Supervision or verbal cues Wheel 150 feet activity did not occur: Safety/medical concerns Turns around,maneuvers to table,bed, and toilet,negotiates 3% grade,maneuvers on rugs and over doorsills: No Function - Locomotion: Ambulation Assistive device: No device Max distance: 120 ft  Assist level: Touching or steadying assistance (Pt > 75%) Assist level: Touching or steadying assistance (Pt > 75%) Assist level: Touching or steadying assistance (Pt > 75%) Walk 150 feet activity did not occur: Safety/medical concerns Assist level: Touching or steadying assistance (Pt > 75%)  Function - Comprehension Comprehension: Auditory Comprehension assist level: Understands complex 90% of the time/cues 10% of the time  Function - Expression Expression: Verbal Expression assist level: Expresses basic  needs/ideas: With no assist  Function - Social Interaction Social Interaction assist level: Interacts appropriately with others with medication or extra time (anti-anxiety, antidepressant).  Function - Problem Solving Problem solving assist level: Solves basic problems with no assist, Solves complex 90% of the time/cues < 10% of the time  Function - Memory Memory assist level: Recognizes or recalls 90% of the time/requires cueing < 10% of the time Patient normally able to recall (first 3 days only): Current season, Location of own room, Staff names and faces, That he or she is in a hospital  Medical Problem List and Plan: 1.  Deficits with mobility, dysarthria, self-care secondary to right lateral frontal and insula early/subacute infarct MCA distribution Team conference today please see physician documentation under team conference tab, met with team face-to-face to discuss problems,progress, and goals. Formulized individual treatment plan based on medical history, underlying problem and comorbidities.   2.  Apical mural thrombus/DVT prophylaxis/Anticoagulation: Pharmaceutical: Xarelto Per Neuro also on clopidigrel 3. Pain Management: tylenol pron 4. Mood: LCSW to follow for  evaluation and support.  5. Neuropsych: This patient is capable of making decisions on her own behalf. 6. Skin/Wound Care: routine pressure relief measures.  7. Fluids/Electrolytes/Nutrition: Monitor I/O--check lytes in am. 8. HTN: Monitor bid. Continue Coreg bid Vitals:   07/14/17 1357 07/15/17 0508  BP: 121/85 120/76  Pulse: (!) 55 (!) 56  Resp: 18 17  Temp: 98.5 F (36.9 C) 98.5 F (36.9 C)  SpO2: 100% 100%  Controlled 5/1 9. Headaches: Report constant piercing/stabbing pain. Controlled after increase  patient to BID topamax.     LOS (Days) 2 A FACE TO FACE EVALUATION WAS PERFORMED  Charlett Blake 07/15/2017, 7:56 AM

## 2017-07-15 NOTE — Progress Notes (Signed)
Occupational Therapy Session Note  Patient Details  Name: Lindsey [name] MRN: [id number] Date of Birth: 1970-05-17  Today's Date: 07/15/2017 OT Individual Time: 1115-5208 and 1430-1455 OT Individual Time Calculation (min): 60 min and 25 min   Short Term Goals: Week 1:  OT Short Term Goal 1 (Week 1): STG=LTG due to LOS  Skilled Therapeutic Interventions/Progress Updates:    Session One: Pt seen for OT ADL bathing/dressing session. Pt awake in supine upon arrival, agreeable to tx session and denying pain. She transferred to EOB mod I using hospital bed functions. She ate breakfast seated EOB, encouragement to use dominant and effected L UE to self feed, able to complete with slightly increased time but demonstrates good control and strength during functional use.  She ambulated throughout room with guarding assist, no AD. She gathered items in prep for shower, leaning towards floor and overhead to retrieve items with close supervision. She bathed seated on 3-1 BSC with distant supervision, standing with use of grab bars to complete pericare/buttock hygiene.  She returned to sitting EOB to dress with set-up/supervision. She ambulated throughout room to clean up laundry items, picking wet towels up from floor and guarding assist. She completed grooming tasks standing at sink using B UEs simultaneously. She required VCs for shutting water off when finished.  She ambulated throughout unit with close supervision, VCs to increase ambulation speed and stride length.  In therapy day room, completed ambulation around room with music playing, ambulating to beat of music to facilitate increased walking speed. Pt left seated in chair in day room at end of session with hand off to SLP.   Session Two: Pt seen for OT session focusing on functional ambulation and transfers. Pt in supine upon arrival with CSW and mother present, pt agreeable to tx session. She ambulated throughout unit with close supervision, VCs  to increase stride length and walking speed in order to reduce risk of fall.  In ADL apartment, she completed simulated tub/shower transfer in prep for d/c home. Utilized tub transfer bench for first trial and stepping over tub wall on second trial, both completed at supervision level following demonstration for technique. Pt requiring some cuing for safety and immediately recalling technique. Pt feels comfortable stepping over tub wall to shower seat vs tub transfer bench, believe this will be safe method at d/c. Pt ambulated back to room in same manner as described above. Left sitting EOB with family and RN present.   Therapy Documentation Precautions:  Precautions Precautions: Fall Restrictions Weight Bearing Restrictions: No Pain:   No/denies pain  See Function Navigator for Current Functional Status.   Therapy/Group: Individual Therapy  Delories Mauri L 07/15/2017, 7:05 AM

## 2017-07-15 NOTE — Progress Notes (Signed)
Speech Language Pathology Daily Session Note  Patient Details  Name: AELA DRYDEN MRN: [id number] Date of Birth: [birth year]  Today's Date: 07/15/2017 SLP Individual Time: 0830-0930 SLP Individual Time Calculation (min): 60 min  Short Term Goals: Week 1: SLP Short Term Goal 1 (Week 1): Pt will recall, complex novel and daily information with Mod I use of compensatory strategies. SLP Short Term Goal 2 (Week 1): Pt will demonstrate selective attention during functional tasks in modertately distracting environments at Mod I. SLP Short Term Goal 3 (Week 1): Pt will demonstrate fucntional problem solving for semi-complex tasks at Mod I. SLP Short Term Goal 4 (Week 1): Pt will self-monitor and correct functional errors at Mod I. SLP Short Term Goal 5 (Week 1): Pt will scan left of midline during functional task at Mod I. SLP Short Term Goal 6 (Week 1): Pt will consume regular textured soilds and thin liquids with Mod I use of swallow strategies for oral clearance.  Skilled Therapeutic Interventions: Skilled treatment session focused on cognition goals. SLP facilitated session by providing supervision faded to Mod I for semi-complex medication management task. Pt with statements that she understands how to take her medicine, she has her medication readily available on the kitchen sink but just doesn't take it. SLP facilitated ways to promote taking medication. SLP further facilitated session by providing Mod I for completion of semi-complex novel card game. Pt was returned to room and left with nursing. Continue per current plan of care.      Function:  Eating Eating                 Cognition Comprehension Comprehension assist level: Understands complex 90% of the time/cues 10% of the time  Expression   Expression assist level: Expresses basic needs/ideas: With no assist  Social Interaction Social Interaction assist level: Interacts appropriately with others with medication or extra time  (anti-anxiety, antidepressant).  Problem Solving Problem solving assist level: Solves complex 90% of the time/cues < 10% of the time;Solves basic problems with no assist  Memory Memory assist level: Recognizes or recalls 90% of the time/requires cueing < 10% of the time    Pain Pain Assessment Pain Score: 6  Pain Type: Acute pain Pain Location: Head Pain Descriptors / Indicators: Headache Pain Intervention(s): Medication (See eMAR)  Therapy/Group: Individual Therapy  Lavell Ridings 07/15/2017, 12:15 PM

## 2017-07-15 NOTE — Evaluation (Signed)
Recreational Therapy Assessment and Plan  Patient Details  Name: Lindsey [name] MRN: [id number] Date of Birth: [birth year] Today's Date: 07/15/2017  Rehab Potential: Good ELOS: 7 days Assessment Problem List:      Patient Active Problem List   Diagnosis Date Noted  . Tobacco use disorder 07/13/2017  . Noncompliance with medication regimen 07/13/2017  . Acute ischemic right MCA stroke ([place]) 07/13/2017  . Benign essential HTN   . Stroke (cerebrum) ([place]) 07/08/2017  . Middle cerebral artery embolism, right 07/08/2017  . Vitamin B12 deficiency 08/15/2016  . Acute cholecystitis 08/14/2016  . GERD (gastroesophageal reflux disease) 04/24/2015  . Hypertensive urgency 04/23/2015  . Chest pain, unspecified   . Right leg swelling 03/07/2015  . Preventative health care 02/21/2015  . Esophageal reflux   . NSTEMI (non-ST elevated myocardial infarction) ([place]) 01/28/2015  . Nausea without vomiting 12/22/2014  . Dysuria 12/21/2014  . Diarrhea 09/26/2014  . Difficulty swallowing pills 09/26/2014  . Hemorrhoids 06/16/2014  . Elevated AST (SGOT) 03/27/2014  . Lumbar herniated disc 03/27/2014  . TIA (transient ischemic attack) 02/19/2014  . History of ischemic left PCA stroke 02/19/2014  . Palpitations 01/27/2014  . Depression 08/15/2013  . Long term current use of anticoagulant therapy 08/10/2013  . Gastritis and duodenitis 08/03/2013  . Diverticulosis 08/03/2013  . Fatty liver 08/03/2013  . Esophagitis 08/03/2013  . Hiatal hernia 08/03/2013  . Apical mural thrombus 08/02/2013  . GI bleeding 06/04/2012  . Hypokalemia 06/04/2012  . Antithrombin III deficiency ([place]) 06/04/2012  . Paresthesias 06/29/2011  . Hyperlipemia 06/07/2009  . Essential hypertension 05/17/2009  . OBESITY-MORBID (>100') 03/13/2008  . CAD S/P  BMS to proximal LAD 2009-patent 01/29/15 06/28/2007    Past Medical History:      Past Medical History:  Diagnosis Date  . Anemia   . Antithrombin III deficiency  ([place])    ?pt not sure if true diagnosis  . Anxiety   . Apical mural thrombus   . Blood transfusion   . Cholecystitis 07/2016  . Coronary artery disease    a. apical LAD infarction '00. b. NSTEMI s/p BMS to prox LAD '09. c. Cath 01/2015: stable LAD stent, otherwise minimal nonobstructive CAD.  Marland Kitchen GERD (gastroesophageal reflux disease)   . Hyperlipidemia   . Hypertension   . Myocardial infarction ([place])   . Noncompliance with medication regimen    a. h/o noncompliance with med regimen (previous running out of Coumadin).  . Stroke ([place])    assoc with short term memory loss and right peripheral vision loss; age 30   . TIA (transient ischemic attack) 2010  . Tobacco abuse    Past Surgical History:       Past Surgical History:  Procedure Laterality Date  . CARDIAC CATHETERIZATION    . CARDIAC CATHETERIZATION N/A 01/29/2015   Procedure: Left Heart Cath and Coronary Angiography;  Surgeon: Peter M Martinique, MD;  Location: [facility] CV LAB;  Service: Cardiovascular;  Laterality: N/A;  . CHOLECYSTECTOMY N/A 08/14/2016   Procedure: LAPAROSCOPIC CHOLECYSTECTOMY;  Surgeon: Greer Pickerel, MD;  Location: [facility];  Service: General;  Laterality: N/A;  . COLONOSCOPY N/A 03/29/2014   Procedure: COLONOSCOPY;  Surgeon: Inda Castle, MD;  Location: [facility];  Service: Endoscopy;  Laterality: N/A;  . CORONARY ANGIOPLASTY    . ESOPHAGOGASTRODUODENOSCOPY N/A 06/09/2012   Procedure: ESOPHAGOGASTRODUODENOSCOPY (EGD);  Surgeon: Beryle Beams, MD;  Location: [facility];  Service: Endoscopy;  Laterality: N/A;  . ESOPHAGOGASTRODUODENOSCOPY N/A 08/02/2013   Procedure: ESOPHAGOGASTRODUODENOSCOPY (EGD);  Surgeon: Ladene Artist, MD;  Location: [facility] ENDOSCOPY;  Service: Endoscopy;  Laterality: N/A;  . IR ANGIO INTRA EXTRACRAN SEL COM CAROTID INNOMINATE UNI R MOD SED  07/08/2017  . LEFT HEART CATHETERIZATION WITH CORONARY ANGIOGRAM N/A 12/24/2011   Procedure: LEFT HEART CATHETERIZATION  WITH CORONARY ANGIOGRAM;  Surgeon: Burnell Blanks, MD;  Location: [facility] CATH LAB;  Service: Cardiovascular;  Laterality: N/A;  . RADIOLOGY WITH ANESTHESIA N/A 07/08/2017   Procedure: IR WITH ANESTHESIA;  Surgeon: Radiologist, Medication, MD;  Location: [facility];  Service: Radiology;  Laterality: N/A;  . TUBAL LIGATION      Assessment & Plan Clinical Impression: Patient is a 47 y.o. year old female with history of CAD s/p stenting, history of apical mural thrombus with CVA 2016 --residual STM deficits and right field cut, medication non-compliance, GERD, HTN who was admitted on 07/08/17 with sudden onset of left facial droop and LUE weakness. History taken from chart review and patient. CT head reviewed, unremarkable for acute intracranial process. Cerebral angio revealing occluded superior division mid 1/3 division of R-MCA with extensive collaterals and spontaneous recanalization therefore no endovascular treatment recommended. MRI brain done revealing right lateral frontal and insula early/subacute infarct without hemorrhage. 2D echo showed fixed, medium apical thrombus with mild LVH, EF 55-60% with grade one DD.  Patient transferred to CIR on 07/13/2017 .   Pt presents with decreased activity tolerance, decreased functional mobility, decreased balance, decreased coordination, left inattention, decreased awareness, decreased problem solving, decreased safety awareness and decreased memory Limiting pt's independence with leisure/community pursuits.   Plan Min 1 TR session for community reintegration skills >25 minutes during LOS  Recommendations for other services: None   Discharge Criteria: Patient will be discharged from TR if patient refuses treatment 3 consecutive times without medical reason.  If treatment goals not met, if there is a change in medical status, if patient makes no progress towards goals or if patient is discharged from hospital.  The above assessment, treatment plan,  treatment alternatives and goals were discussed and mutually agreed upon: by patient  [place] 07/15/2017, 4:17 PM

## 2017-07-15 NOTE — Plan of Care (Signed)
  Problem: Consults Goal: RH STROKE PATIENT EDUCATION Description See Patient Education module for education specifics  Outcome: Progressing   Problem: RH SAFETY Goal: RH STG ADHERE TO SAFETY PRECAUTIONS W/ASSISTANCE/DEVICE Description STG Adhere to Safety Precautions With min Assistance/Device.  Outcome: Progressing   

## 2017-07-15 NOTE — Consult Note (Signed)
Neuropsychological Consultation   Patient:   Lindsey [name]   DOB:   November 09, 1970  MR Number:  [id number]  Location:  [facility] [id number] [place] [address] Dept: [phone] Loc: [phone]           Date of Service:   07/15/2017  Start Time:   1 PM End Time:   2 PM  Provider/Observer:  Arley Phenix, Psy.D.       Clinical Neuropsychologist       Billing Code/Service: [phone] 4 Units  Chief Complaint:    Lindsey [name] is a 47 year old female with history of CAD s/p stenting, history of apical mural thrombus with CVA 2016.  Patient has reports of some residual STM deficits and right field cut.  The patient was medication non-compliance.  Patient was admitted on 07/08/2017 with sudden onset of left facial drop and LUE weakness.  R-MCA / right lateral frontal and insula early/subacute infarct without hemorrhage.  The patient reports that she had been frustrated with medications and had not been taking them.  She also had been dealing with depressive symptoms and motivation overall was poor.  Reason for Service:  HPI: [name] is a 47 year old female with history of CAD s/p stenting, history of apical mural thrombus with CVA 2016 --residual STM deficits and right field cut, medication non-compliance,  GERD, HTN who was admitted on 07/08/17 with sudden onset of left facial droop and LUE weakness. History taken from chart review and patient. CT head reviewed, unremarkable for acute intracranial process. Cerebral angio revealing occluded superior division mid 1/3 division of R-MCA with extensive collaterals and spontaneous recanalization therefore no endovascular treatment recommended. MRI brain done revealing right lateral frontal and insula early/subacute infarct without hemorrhage.    2D echo showed fixed, medium apical thrombus with mild LVH, EF 55-60% with grade one DD.  Current  Status:  Patient reports that her memory is close to where it was, although mother reports that memory is still and issues.  Patient reports that she had been "depressed" before recent stroke.  She reports that depression may have played a roll in not taking her medications.  Patient reports that depressive symptoms have been worse since stroke.  Behavioral Observation: [name]  presents as a 47 y.o.-year-old Right African American Female who appeared her stated age. her dress was Appropriate and she was Well Groomed and her manners were Appropriate to the situation.  her participation was indicative of Appropriate and Drowsy behaviors.  There were any physical disabilities noted.  she displayed an appropriate level of cooperation and motivation.     Interactions:    Active Appropriate and Drowsy  Attention:   abnormal and attention span appeared shorter than expected for age  Memory:   abnormal; remote memory intact, recent memory impaired  Visuo-spatial:  not examined  Speech (Volume):  low  Speech:   normal; slurred  Thought Process:  Coherent and Relevant  Though Content:  WNL; not suicidal and not homicidal  Orientation:   person, place, time/date and situation  Judgment:   Fair  Planning:   Fair  Affect:    Depressed  Mood:    Depressed and Dysphoric  Insight:   Fair  Intelligence:   normal  Medical History:   Past Medical History:  Diagnosis Date  . Anemia   . Antithrombin III deficiency ([facility])    ?pt not sure if  true diagnosis  . Anxiety   . Apical mural thrombus   . Blood transfusion   . Cholecystitis 07/2016  . Coronary artery disease    a. apical LAD infarction '00. b. NSTEMI s/p BMS to prox LAD '09. c. Cath 01/2015: stable LAD stent, otherwise minimal nonobstructive CAD.  Marland Kitchen GERD (gastroesophageal reflux disease)   . Hyperlipidemia   . Hypertension   . Myocardial infarction ([facility])   . Noncompliance with medication regimen    a. h/o noncompliance  with med regimen (previous running out of Coumadin).  . Stroke ([facility])    assoc with short term memory loss and right peripheral vision loss; age 69   . TIA (transient ischemic attack) 2010  . Tobacco abuse          Psychiatric History:  While patient has no prior reports of depression and treatment, she does reprot that she had been depressed with decreased motivation and effort prior to 2019 stroke.  Family Med/Psych History:  Family History  Problem Relation Age of Onset  . Heart disease Brother        arrhythmia; died  . Breast cancer Maternal Aunt     Risk of Suicide/Violence: low Patient denies current SI or HI.  Impression/DX:  [name] is a 47 year old female with history of CAD s/p stenting, history of apical mural thrombus with CVA 2016.  Patient has reports of some residual STM deficits and right field cut.  The patient was medication non-compliance.  Patient was admitted on 07/08/2017 with sudden onset of left facial drop and LUE weakness.  R-MCA / right lateral frontal and insula early/subacute infarct without hemorrhage.  The patient reports that she had been frustrated with medications and had not been taking them.  She also had been dealing with depressive symptoms and motivation overall was poor.  Patient reports that her memory is close to where it was, although mother reports that memory is still and issues.  Patient reports that she had been "depressed" before recent stroke.  She reports that depression may have played a roll in not taking her medications.  Patient reports that depressive symptoms have been worse since stroke.  Worked on issues of coping and depression today.  Worked on coping strategies and patient receptive.    Disposition/Plan:  Will monitor for any worsening of depressive symptoms.  Will see patient again next week.  Diagnosis:    Acute ischemic right MCA stroke [facility]) - Plan: Ambulatory referral to Physical Medicine Rehab          Electronically Signed   _______________________ Arley Phenix, Psy.D.

## 2017-07-16 ENCOUNTER — Inpatient Hospital Stay (HOSPITAL_COMMUNITY): Payer: Medicaid Other | Admitting: Occupational Therapy

## 2017-07-16 ENCOUNTER — Inpatient Hospital Stay (HOSPITAL_COMMUNITY): Payer: Medicaid Other | Admitting: Speech Pathology

## 2017-07-16 ENCOUNTER — Inpatient Hospital Stay (HOSPITAL_COMMUNITY): Payer: Medicaid Other | Admitting: Physical Therapy

## 2017-07-16 NOTE — Progress Notes (Signed)
Speech Language Pathology Daily Session Note  Patient Details  Name: Lindsey [name] MRN: [id number] Date of Birth: 1970-07-22  Today's Date: 07/16/2017 SLP Individual Time: 2426-8341 SLP Individual Time Calculation (min): 45 min  Short Term Goals: Week 1: SLP Short Term Goal 1 (Week 1): Pt will recall, complex novel and daily information with Mod I use of compensatory strategies. SLP Short Term Goal 2 (Week 1): Pt will demonstrate selective attention during functional tasks in modertately distracting environments at Mod I. SLP Short Term Goal 3 (Week 1): Pt will demonstrate fucntional problem solving for semi-complex tasks at Mod I. SLP Short Term Goal 4 (Week 1): Pt will self-monitor and correct functional errors at Mod I. SLP Short Term Goal 5 (Week 1): Pt will scan left of midline during functional task at Mod I. SLP Short Term Goal 6 (Week 1): Pt will consume regular textured soilds and thin liquids with Mod I use of swallow strategies for oral clearance.  Skilled Therapeutic Interventions: Skilled treatment session focused on cognition goals. SLP facilitated session by providing supervision cues to problem solve different safety situations. SLP also facilitated conversation about noncompliance with medication management. Pt more open about emotional barriers to taking medication including depression over young age and need for multiple medications. She states that she wishes she was healthier and didn't need medication. She is able to recall MD's conversation earlier in morning about poor prognosis if she continues to be noncompliant with medications. SLP and pt created some motivational phrases to place by medication for positivity towards medication. SLP with type and pt will decorate. Pt left upright in her bed with all needs within reach. Continue per current plan of care.      Function:    Cognition Comprehension Comprehension assist level: Understands complex 90% of the time/cues  10% of the time  Expression   Expression assist level: Expresses basic needs/ideas: With no assist  Social Interaction Social Interaction assist level: Interacts appropriately with others with medication or extra time (anti-anxiety, antidepressant).  Problem Solving Problem solving assist level: Solves complex 90% of the time/cues < 10% of the time;Solves basic problems with no assist  Memory Memory assist level: Recognizes or recalls 90% of the time/requires cueing < 10% of the time    Pain Pain Assessment Pain Score: 2  Faces Pain Scale: Hurts a little bit PAINAD (Pain Assessment in Advanced Dementia) Breathing: normal Negative Vocalization: none Facial Expression: smiling or inexpressive Body Language: relaxed Consolability: no need to console PAINAD Score: 0  Therapy/Group: Individual Therapy  Lleyton Byers 07/16/2017, 10:14 AM

## 2017-07-16 NOTE — Progress Notes (Signed)
[facility]patient Rehabilitation Center Individual Statement of Services  Patient Name:  Lindsey [name]  Date:  07/16/2017  Welcome to the Inpatient Rehabilitation Center.  Our goal is to provide you with an individualized program based on your diagnosis and situation, designed to meet your specific needs.  With this comprehensive rehabilitation program, you will be expected to participate in at least 3 hours of rehabilitation therapies Monday-Friday, with modified therapy programming on the weekends.  Your rehabilitation program will include the following services:  Physical Therapy (PT), Occupational Therapy (OT), Speech Therapy (ST), 24 hour per day rehabilitation nursing, Neuropsychology, Case Management (Social Worker), Rehabilitation Medicine, Nutrition Services and Pharmacy Services  Weekly team conferences will be held on Wednesdays to discuss your progress.  Your Social Worker will talk with you frequently to get your input and to update you on team discussions.  Team conferences with you and your family in attendance may also be held.  Expected length of stay:  7 to 10 days  Overall anticipated outcome:  Modified Independent with supervision for meal preparation, laundry, housekeeping, and community ambulation  Depending on your progress and recovery, your program may change. Your Social Worker will coordinate services and will keep you informed of any changes. Your Social Worker's name and contact numbers are listed  below.  The following services may also be recommended but are not provided by the Inpatient Rehabilitation Center:   Driving Evaluations  Home Health Rehabiltiation Services  Outpatient Rehabilitation Services  Vocational Rehabilitation   Arrangements will be made to provide these services after discharge if needed.  Arrangements include referral to agencies that provide these services.  Your insurance has been verified to be:  Medicaid Your primary doctor is:  [facility]  Pertinent information will be shared with your doctor and your insurance company.  Social Worker:  Staci Acosta, LCSW  [phone] or ([phone]  Information discussed with and copy given to patient by: Elvera Lennox, 07/16/2017, 11:47 AM

## 2017-07-16 NOTE — Progress Notes (Signed)
Social Work Patient ID: Lindsey [name], female   DOB: [birth year], 47 y.o.   MRN: [id number]   CSW met with pt and her mother 07-15-17 to update them on team conference discussion and targeted d/c date of 07-21-17.  Pt was pleased with this, but really wants her smile and face to be back to normal.  Pt has good family support and has mod I goals.  CSW will continue to follow and prepare pt for 07-21-17 d/c.

## 2017-07-16 NOTE — Progress Notes (Signed)
Subjective/Complaints:  Pt working with Amy from OT, denies HA, had BM yesterday  ROS-  No CP, SOB, N/V/D, no bladder issues, + constipation  Objective: Vital Signs: Blood pressure 108/75, pulse (!) 54, temperature 98.3 F (36.8 C), temperature source Oral, resp. rate 18, weight 80.5 kg (177 lb 7.5 oz), SpO2 99 %. No results found. Results for orders placed or performed during the hospital encounter of 07/13/17 (from the past 72 hour(s))  CBC WITH DIFFERENTIAL     Status: Abnormal   Collection Time: 07/14/17  5:20 AM  Result Value Ref Range   WBC 6.7 4.0 - 10.5 K/uL   RBC 3.71 (L) 3.87 - 5.11 MIL/uL   Hemoglobin 10.6 (L) 12.0 - 15.0 g/dL   HCT 33.4 (L) 36.0 - 46.0 %   MCV 90.0 78.0 - 100.0 fL   MCH 28.6 26.0 - 34.0 pg   MCHC 31.7 30.0 - 36.0 g/dL   RDW 13.2 11.5 - 15.5 %   Platelets 256 150 - 400 K/uL   Neutrophils Relative % 54 %   Neutro Abs 3.6 1.7 - 7.7 K/uL   Lymphocytes Relative 33 %   Lymphs Abs 2.2 0.7 - 4.0 K/uL   Monocytes Relative 10 %   Monocytes Absolute 0.7 0.1 - 1.0 K/uL   Eosinophils Relative 3 %   Eosinophils Absolute 0.2 0.0 - 0.7 K/uL   Basophils Relative 0 %   Basophils Absolute 0.0 0.0 - 0.1 K/uL    Comment: Performed at [facility], [address] [place], [place] [address]  Comprehensive metabolic panel     Status: Abnormal   Collection Time: 07/14/17  5:20 AM  Result Value Ref Range   Sodium 140 135 - 145 mmol/L   Potassium 3.8 3.5 - 5.1 mmol/L   Chloride 107 101 - 111 mmol/L   CO2 24 22 - 32 mmol/L   Glucose, Bld 95 65 - 99 mg/dL   BUN 7 6 - 20 mg/dL   Creatinine, Ser 0.75 0.44 - 1.00 mg/dL   Calcium 8.8 (L) 8.9 - 10.3 mg/dL   Total Protein 5.5 (L) 6.5 - 8.1 g/dL   Albumin 2.7 (L) 3.5 - 5.0 g/dL   AST 20 15 - 41 U/L   ALT 12 (L) 14 - 54 U/L   Alkaline Phosphatase 71 38 - 126 U/L   Total Bilirubin 0.6 0.3 - 1.2 mg/dL   GFR calc non Af Amer >60 >60 mL/min   GFR calc Af Amer >60 >60 mL/min    Comment: (NOTE) The eGFR has been  calculated using the CKD EPI equation. This calculation has not been validated in all clinical situations. eGFR's persistently <60 mL/min signify possible Chronic Kidney Disease.    Anion gap 9 5 - 15    Comment: Performed at [facility] [address], [place], [place] [address]     HEENT: normal Cardio: RRR and no murmur Resp: CTA B/L and Unlabored GI: BS positive and NT, ND Extremity:  No Edema Skin:   Intact Neuro: Alert/Oriented, Normal Sensory, Abnormal Motor 3/5 Left Del, Bi, Tri , grip, 4/5 L HF, KE ADF and Abnormal FMC Ataxic/ dec FMC Musc/Skel:  Other no pain with UE or LE ROM Gen NAD   Assessment/Plan: 1. 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 Physiatrist is providing close  team supervision and 24 hour management of active medical problems listed below. Physiatrist and rehab team continue to assess barriers to discharge/monitor patient progress toward functional and medical goals. FIM: Function - Bathing Position: Shower Body parts bathed by patient: Right arm, Right lower leg, Left arm, Left lower leg, Chest, Abdomen, Front perineal area, Buttocks, Right upper leg, Left upper leg, Back Body parts bathed by helper: Back Assist Level: Supervision or verbal cues  Function- Upper Body Dressing/Undressing What is the patient wearing?: Pull over shirt/dress Pull over shirt/dress - Perfomed by patient: Thread/unthread right sleeve, Thread/unthread left sleeve, Put head through opening, Pull shirt over trunk Assist Level: Set up Set up : To obtain clothing/put away Function - Lower Body Dressing/Undressing What is the patient wearing?: Pants, Non-skid slipper socks Position: Sitting EOB Pants- Performed by patient: Thread/unthread right pants leg, Thread/unthread left pants leg, Pull  pants up/down Non-skid slipper socks- Performed by patient: Don/doff right sock, Don/doff left sock Assist for footwear: Supervision/touching assist Assist for lower body dressing: Supervision or verbal cues  Function - Toileting Toileting steps completed by patient: Adjust clothing prior to toileting, Performs perineal hygiene, Adjust clothing after toileting Toileting steps completed by helper: Adjust clothing prior to toileting Toileting Assistive Devices: Grab bar or rail Assist level: Touching or steadying assistance (Pt.75%)  Function - Air cabin crew transfer assistive device: Grab bar Assist level to toilet: Touching or steadying assistance (Pt > 75%) Assist level from toilet: Touching or steadying assistance (Pt > 75%)  Function - Chair/bed transfer Chair/bed transfer method: Stand pivot Chair/bed transfer assist level: Supervision or verbal cues Chair/bed transfer assistive device: Armrests Chair/bed transfer details: Verbal cues for technique  Function - Locomotion: Wheelchair Will patient use wheelchair at discharge?: No Max wheelchair distance: 50 ft  Assist Level: Supervision or verbal cues Assist Level: Supervision or verbal cues Wheel 150 feet activity did not occur: Safety/medical concerns Turns around,maneuvers to table,bed, and toilet,negotiates 3% grade,maneuvers on rugs and over doorsills: No Function - Locomotion: Ambulation Assistive device: No device Max distance: 150 ft  Assist level: Supervision or verbal cues Assist level: Supervision or verbal cues Assist level: Supervision or verbal cues Walk 150 feet activity did not occur: Safety/medical concerns Assist level: Supervision or verbal cues Assist level: Touching or steadying assistance (Pt > 75%)  Function - Comprehension Comprehension: Auditory Comprehension assist level: Understands complex 90% of the time/cues 10% of the time  Function - Expression Expression: Verbal Expression  assist level: Expresses basic needs/ideas: With no assist  Function - Social Interaction Social Interaction assist level: Interacts appropriately with others with medication or extra time (anti-anxiety, antidepressant).  Function - Problem Solving Problem solving assist level: Solves complex 90% of the time/cues < 10% of the time, Solves basic problems with no assist  Function - Memory Memory assist level: Recognizes or recalls 90% of the time/requires cueing < 10% of the time Patient normally able to recall (first 3 days only): Current season, Location of own room, Staff names and faces, That he or she is in a hospital  Medical Problem List and Plan: 1.  Deficits with mobility, dysarthria, self-care secondary to right lateral frontal and insula early/subacute infarct MCA distribution Cont CIR PT, OT, - discussed med compliance to reduce recurrent CVA risk   2.  Apical mural thrombus/DVT prophylaxis/Anticoagulation: Pharmaceutical: Xarelto Per Neuro also on clopidigrel 3. Pain Management: tylenol pron 4. Mood: LCSW to follow for evaluation and support.  5. Neuropsych: This patient is capable of making decisions on  her own behalf. 6. Skin/Wound Care: routine pressure relief measures.  7. Fluids/Electrolytes/Nutrition: Monitor I/O--check lytes in am. 8. HTN: Monitor bid. Continue Coreg bid Vitals:   07/15/17 1518 07/16/17 0500  BP: 119/84 108/75  Pulse: 65 (!) 54  Resp: 17 18  Temp: 98.3 F (36.8 C) 98.3 F (36.8 C)  SpO2: 100% 99%  Controlled 5/2 9. Headaches: Report constant piercing/stabbing pain. Controlled after increase  patient to BID topamax.  10.  Constipation improved on Senna 11.  Hx of visual field cut after prior CVA 3 yr ago, restricted driving per Optho- discussed, bedside confrontation nl  re eval as outpt  LOS (Days) [place] E Kirsteins 07/16/2017, 7:41 AM

## 2017-07-16 NOTE — Progress Notes (Signed)
Physical Therapy Session Note  Patient Details  Name: Lindsey [name] MRN: [id number] Date of Birth: [birth year]  Today's Date: 07/16/2017 PT Individual Time: 1000-1040 PT Individual Time Calculation (min): 40 min   Short Term Goals: Week 1:  PT Short Term Goal 1 (Week 1): STG = LTG due to short ELOS.  Skilled Therapeutic Interventions/Progress Updates:   Pt in supine and agreeable to therapy, denies pain. Session focused on community ambulation and endurance. Ambulated outside w/o AD w/ close supervision in 150-250' bouts and negotiated 8 steps w/ rail support and 3 steps w/o rail support and uneven surfaces. Emphasis on energy conservation, recognizing when she needs a rest break. Returned to unit and performed NuStep 10 min @ level 4 for LE strengthening and endurance. Returned to room and ended session in recliner, call bell within reach and all needs met. Chair alarm activated.   Therapy Documentation Precautions:  Precautions Precautions: Fall Restrictions Weight Bearing Restrictions: No Pain: Pain Assessment Pain Score: 2  Faces Pain Scale: Hurts a little bit PAINAD (Pain Assessment in Advanced Dementia) Breathing: normal Negative Vocalization: none Facial Expression: smiling or inexpressive Body Language: relaxed Consolability: no need to console PAINAD Score: 0  See Function Navigator for Current Functional Status.   Therapy/Group: Individual Therapy  Melquisedec Journey K Arnette 07/16/2017, 10:44 AM

## 2017-07-16 NOTE — Patient Care Conference (Signed)
Inpatient RehabilitationTeam Conference and Plan of Care Update Date: 07/15/2017   Time: 10:45 AM    Patient Name: Lindsey [name]      Medical Record Number: [id number]  Date of Birth: [birth year] Sex: Female         Room/Bed: 4W03C/4W03C-01 Payor Info: Payor: MEDICAID [place] / Plan: MEDICAID [place] ACCESS / Product Type: *No Product type* /    Admitting Diagnosis: R CVA  Admit Date/Time:  07/13/2017  5:55 PM Admission Comments: No comment available   Primary Diagnosis:  <principal problem not specified> Principal Problem: <principal problem not specified>  Patient Active Problem List   Diagnosis Date Noted  . Tobacco use disorder 07/13/2017  . Noncompliance with medication regimen 07/13/2017  . Acute ischemic right MCA stroke (HCC) 07/13/2017  . Benign essential HTN   . Stroke (cerebrum) (HCC) 07/08/2017  . Middle cerebral artery embolism, right 07/08/2017  . Vitamin B12 deficiency 08/15/2016  . Acute cholecystitis 08/14/2016  . GERD (gastroesophageal reflux disease) 04/24/2015  . Hypertensive urgency 04/23/2015  . Chest pain, unspecified   . Right leg swelling 03/07/2015  . Preventative health care 02/21/2015  . Esophageal reflux   . NSTEMI (non-ST elevated myocardial infarction) (HCC) 01/28/2015  . Nausea without vomiting 12/22/2014  . Dysuria 12/21/2014  . Diarrhea 09/26/2014  . Difficulty swallowing pills 09/26/2014  . Hemorrhoids 06/16/2014  . Elevated AST (SGOT) 03/27/2014  . Lumbar herniated disc 03/27/2014  . TIA (transient ischemic attack) 02/19/2014  . History of ischemic left PCA stroke 02/19/2014  . Palpitations 01/27/2014  . Depression 08/15/2013  . Long term current use of anticoagulant therapy 08/10/2013  . Gastritis and duodenitis 08/03/2013  . Diverticulosis 08/03/2013  . Fatty liver 08/03/2013  . Esophagitis 08/03/2013  . Hiatal hernia 08/03/2013  . Apical mural thrombus 08/02/2013  . GI bleeding 06/04/2012  . Hypokalemia 06/04/2012  . Antithrombin  III deficiency (HCC) 06/04/2012  . Paresthesias 06/29/2011  . Hyperlipemia 06/07/2009  . Essential hypertension 05/17/2009  . OBESITY-MORBID (>100') 03/13/2008  . CAD S/P  BMS to proximal LAD 2009-patent 01/29/15 06/28/2007    Expected Discharge Date: Expected Discharge Date: 07/21/17  Team Members Present: Physician leading conference: Dr. Claudette Laws Social Worker Present: Staci Acosta, LCSW Nurse Present: Kennon Portela, RN PT Present: Aleda Grana, PT OT Present: Johnsie Cancel, OT SLP Present: Reuel Derby, SLP PPS Coordinator present : Tora Duck, RN, CRRN     Current Status/Progress Goal Weekly Team Focus  Medical   on Plavix and Xarelto post CVA no sign of bleeding , mild anemia, normal BMET  maintain medical stability during rehab admission  monitor bradycardia adjust meds   Bowel/Bladder   Continent of B/B, LBM 4/28  Maintain continence and establish regular bowel pattern  Assist with toileting as needed   Swallow/Nutrition/ Hydration   Supervision cues with regular  Mod I   use of lingual sweep for oral clearing   ADL's   CGA overall, HHA functional transfers  Mod I overall, Supervision IADLs  Functional activity tolerance and balance, ADL/IADL re-training, neuro re-ed, d/c planning   Mobility   min guard overall without AD  supervision<>mod I without AD  L NMR, balance, stairs, gait, strengthening, endurance, L attention   Communication             Safety/Cognition/ Behavioral Observations  Min A with complex problem solving  Mod I   complex problem solving, selective attention, awareness   Pain   c/o Headache. Topamax daily. Tylenol PRN.   Pain <=3/10.  Assess pain Q shift and PRN. Notify MD for unrelieved pain.    Skin   No current skin isuues.   Skin will remain intact and free of infection.   Assess skin Q shift and PRN.     Rehab Goals Patient on target to meet rehab goals: Yes Rehab Goals Revised: none *See Care Plan and progress notes for long  and short-term goals.     Barriers to Discharge  Current Status/Progress Possible Resolutions Date Resolved   Physician    Medical stability     porgressing toward goals  Cont rehab      Nursing                  PT  Decreased caregiver support  pt home alone during the day              OT                  SLP                SW                Discharge Planning/Teaching Needs:  Pt plans to return to her home where she lives with her 49 y/o dtr and her 49 y/o dtr.  Her mother will help with transportation.  Pt will be modified independent.  She will be able to direct any care she may need.   Team Discussion:  Dr. Wynn Banker stated that pt's headaches are better per her report with Topamax, but RN stated that pt has been c/o headaches with her.  She has given Tylenol with the Topamax and that is helping.  Pt hasn't had a BM since 07-11-17 and is requesting MD schedule senna.  Pt is ambulating better with nursing and they are working on managing headaches.  Pt is supervision to guard A with OT and has mod I goals, some supervision.  Pt is min A to S with speech, but SLP questions if this is pt's baseline.  Pt's medication compliance was an issue PTA and there are concerns it may be again once d/c'd.  Pt is S overall with PT and not using an assistive device.  Pt with mod I goals at home.  Revisions to Treatment Plan:  none    Continued Need for Acute Rehabilitation Level of Care: The patient requires daily medical management by a physician with specialized training in physical medicine and rehabilitation for the following conditions: Daily direction of a multidisciplinary physical rehabilitation program to ensure safe treatment while eliciting the highest outcome that is of practical value to the patient.: Yes Daily medical management of patient stability for increased activity during participation in an intensive rehabilitation regime.: Yes Daily analysis of laboratory values and/or  radiology reports with any subsequent need for medication adjustment of medical intervention for : Neurological problems;Blood pressure problems  Elric Tirado, [place] 07/16/2017, 11:16 AM

## 2017-07-16 NOTE — Progress Notes (Signed)
Occupational Therapy Session Note  Patient Details  Name: Lindsey [name] MRN: [id number] Date of Birth: 10-31-1970  Today's Date: 07/16/2017 OT Individual Time: 3704-8889 and 1694-5038 OT Individual Time Calculation (min): 60 min and 42 min   Short Term Goals: Week 1:  OT Short Term Goal 1 (Week 1): STG=LTG due to LOS  Skilled Therapeutic Interventions/Progress Updates:    Session One: PT seen for OT session focusing on ADL re-training, functional ambulation and IADL re-training with simple meal prep. Pt sitting upright in bed upon arrival, eating breakfast and agreeable to tx session. She ambulated throughout room with supervision, no AD, intermittent cuing to increase stride length and ambulation speed, pt eventually able to self-cue for L foot clearance.  She gathered grooming items from around room and completed standing grooming tasks as well as toileting tasks with distant supervision. She gathered clothing items, requiring increased time and redirection to task, checking same drawers multiple times for items.  She dressed from standing position, leaning on wall and grab bar for support.  She ambulated to ADL apartment with supervision. Completed simple meal prep activity of making toast, completed at supervision level with occasional cuing for safety awareness and attention to task. Pt tolerated ~20 minutes of dynamic standing before seated rest break provided demonstrating good functional standing tolerance. Seated rest break provided on low soft surface couch which pt was able to transfer from with supervision. During rest break, discussed return to activities, incorporating therapy into daily tasks, healthy hobbies and d/cplanning.  PT returned to room at end of session, left seated in recliner with all needs in reach.   Session Two: Pt seen for OT session focusing on functional ambulation, path finding and problem solving. Pt in supine upon arrival, ready for tx session and denying  pain. She ambulated throughout unit with supervision, self-cuing for faster ambulation speed to be at functional level. Pt able to recall directions for finding gift store with min questioning cues several minutes following directions being given. In hospital gift store, pt required to find 2 gifts with a budget of $20 without going over. Pt able to recall directions of task throughout session, however, required intermittent for attention to task of finding needed gifts in highly stimulating environment. She was able to complete basic addition/subtraction to manage budget mod I.  She was able to successfully path find back to room with 1 VC. Pt left in bathroom to attempt BM at end of session, aware of use pull call cord when finished.   Therapy Documentation Precautions:  Precautions Precautions: Fall Restrictions Weight Bearing Restrictions: No Pain:   No/denies pain  See Function Navigator for Current Functional Status.   Therapy/Group: Individual Therapy  Rikayla Demmon L 07/16/2017, 7:04 AM

## 2017-07-16 NOTE — Progress Notes (Signed)
Social Work Assessment and Plan  Patient Details  Name: Lindsey [name] MRN: [id number] Date of Birth: 08-10-1970  Today's Date: 07/15/2017  Problem List:  Patient Active Problem List   Diagnosis Date Noted  . Tobacco use disorder 07/13/2017  . Noncompliance with medication regimen 07/13/2017  . Acute ischemic right MCA stroke ([place]) 07/13/2017  . Benign essential HTN   . Stroke (cerebrum) ([place]) 07/08/2017  . Middle cerebral artery embolism, right 07/08/2017  . Vitamin B12 deficiency 08/15/2016  . Acute cholecystitis 08/14/2016  . GERD (gastroesophageal reflux disease) 04/24/2015  . Hypertensive urgency 04/23/2015  . Chest pain, unspecified   . Right leg swelling 03/07/2015  . Preventative health care 02/21/2015  . Esophageal reflux   . NSTEMI (non-ST elevated myocardial infarction) ([place]) 01/28/2015  . Nausea without vomiting 12/22/2014  . Dysuria 12/21/2014  . Diarrhea 09/26/2014  . Difficulty swallowing pills 09/26/2014  . Hemorrhoids 06/16/2014  . Elevated AST (SGOT) 03/27/2014  . Lumbar herniated disc 03/27/2014  . TIA (transient ischemic attack) 02/19/2014  . History of ischemic left PCA stroke 02/19/2014  . Palpitations 01/27/2014  . Depression 08/15/2013  . Long term current use of anticoagulant therapy 08/10/2013  . Gastritis and duodenitis 08/03/2013  . Diverticulosis 08/03/2013  . Fatty liver 08/03/2013  . Esophagitis 08/03/2013  . Hiatal hernia 08/03/2013  . Apical mural thrombus 08/02/2013  . GI bleeding 06/04/2012  . Hypokalemia 06/04/2012  . Antithrombin III deficiency ([place]) 06/04/2012  . Paresthesias 06/29/2011  . Hyperlipemia 06/07/2009  . Essential hypertension 05/17/2009  . OBESITY-MORBID (>100') 03/13/2008  . CAD S/P  BMS to proximal LAD 2009-patent 01/29/15 06/28/2007   Past Medical History:  Past Medical History:  Diagnosis Date  . Anemia   . Antithrombin III deficiency ([place])    ?pt not sure if true diagnosis  . Anxiety   . Apical mural  thrombus   . Blood transfusion   . Cholecystitis 07/2016  . Coronary artery disease    a. apical LAD infarction '00. b. NSTEMI s/p BMS to prox LAD '09. c. Cath 01/2015: stable LAD stent, otherwise minimal nonobstructive CAD.  Marland Kitchen GERD (gastroesophageal reflux disease)   . Hyperlipidemia   . Hypertension   . Myocardial infarction ([place])   . Noncompliance with medication regimen    a. h/o noncompliance with med regimen (previous running out of Coumadin).  . Stroke ([place])    assoc with short term memory loss and right peripheral vision loss; age 47   . TIA (transient ischemic attack) 2010  . Tobacco abuse    Past Surgical History:  Past Surgical History:  Procedure Laterality Date  . CARDIAC CATHETERIZATION    . CARDIAC CATHETERIZATION N/A 01/29/2015   Procedure: Left Heart Cath and Coronary Angiography;  Surgeon: Peter M Martinique, MD;  Location: [facility];  Service: Cardiovascular;  Laterality: N/A;  . CHOLECYSTECTOMY N/A 08/14/2016   Procedure: LAPAROSCOPIC CHOLECYSTECTOMY;  Surgeon: Greer Pickerel, MD;  Location: [facility];  Service: General;  Laterality: N/A;  . COLONOSCOPY N/A 03/29/2014   Procedure: COLONOSCOPY;  Surgeon: Inda Castle, MD;  Location: [facility];  Service: Endoscopy;  Laterality: N/A;  . CORONARY ANGIOPLASTY    . ESOPHAGOGASTRODUODENOSCOPY N/A 06/09/2012   Procedure: ESOPHAGOGASTRODUODENOSCOPY (EGD);  Surgeon: Beryle Beams, MD;  Location: [facility];  Service: Endoscopy;  Laterality: N/A;  . ESOPHAGOGASTRODUODENOSCOPY N/A 08/02/2013   Procedure: ESOPHAGOGASTRODUODENOSCOPY (EGD);  Surgeon: Ladene Artist, MD;  Location: [facility] ENDOSCOPY;  Service: Endoscopy;  Laterality: N/A;  . IR ANGIO INTRA  EXTRACRAN SEL COM CAROTID INNOMINATE UNI R MOD SED  07/08/2017  . LEFT HEART CATHETERIZATION WITH CORONARY ANGIOGRAM N/A 12/24/2011   Procedure: LEFT HEART CATHETERIZATION WITH CORONARY ANGIOGRAM;  Surgeon: Burnell Blanks, MD;  Location: [facility] CATH LAB;  Service:  Cardiovascular;  Laterality: N/A;  . RADIOLOGY WITH ANESTHESIA N/A 07/08/2017   Procedure: IR WITH ANESTHESIA;  Surgeon: Radiologist, Medication, MD;  Location: [facility];  Service: Radiology;  Laterality: N/A;  . TUBAL LIGATION     Social History:  reports that she has been smoking cigarettes.  She has a 0.20 pack-year smoking history. She has never used smokeless tobacco. She reports that she drinks alcohol. She reports that she has current or past drug history. Drug: Cocaine.  Family / Support Systems Marital Status: Divorced Patient Roles: Parent, Other (Comment), Partner(Dtr; girlfriend) Spouse/Significant Other: Zigmund Daniel - boyfriend  Children: 12 and 62 y/o dtrs Other Supports: Gardenia Phlegm - mother - [phone] Anticipated Caregiver: Self, mom, boyfriend Ability/Limitations of Caregiver: Mom works 7 am to 3 pm.  Boyfriend, Suezanne Jacquet, is on disability and can assist as needed. Caregiver Availability: Intermittent Family Dynamics: supportive mother and children  Social History Preferred language: English Religion: Baptist Read: Yes Write: Yes Employment Status: Employed Name of Employer: was working as a Scientist, clinical (histocompatibility and immunogenetics) at a [employer] Return to Work Plans: Pt and mother report that pt's work was stressful and that she may not return there when she is able to work.  Pt did not answer CSW's question about if she has other sources of income if she does not return to that job. Legal History/Current Legal Issues: Pt is going through the appeal process for social security disability and has hired an Forensic psychologist, Joette Catching. Guardian/Conservator: N/A - MD has determined that pt is capable of making her own decisions.   Abuse/Neglect Abuse/Neglect Assessment Can Be Completed: Yes Physical Abuse: Denies Verbal Abuse: Denies Sexual Abuse: Denies Exploitation of patient/patient's resources: Denies Self-Neglect: Denies  Emotional Status Pt's affect, behavior and adjustment status: Pt was  flat and down during CSW visit, especially in regard to her facial droop. Recent Psychosocial Issues: Pt was working at [employer] part time and they have wanted her to work full time, but "they don't care about me" and it is too stressful, she does not want to be there fulltime. Psychiatric History: Pt with history of anxiety/panic attacks. Substance Abuse History: none reported  Patient / Family Perceptions, Expectations & Goals Pt/Family understanding of illness & functional limitations: Pt reports a fairly good understanding of her condition, yet has not put it together that her non-compliance on taking her medications prior to admission could have resulted in her stroke. Premorbid pt/family roles/activities: Pt likes to shop and spend time with family.  Watches her younger Data processing manager. Anticipated changes in roles/activities/participation: Pt would like to resume activities as she is able.  She was planning on driving at d/c and CSW explained that pt will not be able to until her Neurologist clears her to do so.  Then she went on to explain times when she shouldn't have been driving prior to this stroke. Pt/family expectations/goals: Pt wants to regain her smile and have her face return to "normal."  [employer]: Other (Comment)(Department of Social Services) Premorbid Home Care/DME Agencies: None Transportation available at discharge: mother Resource referrals recommended: Neuropsychology, Support group (specify)(Stroke support group)  Discharge Planning Living Arrangements: Children Support Systems: Spouse/significant other, Children, Other relatives, Parent, Friends/neighbors Type of Residence: Private residence  Insurance Resources: Medicaid (specify county)([place]) Financial Resources: Employment Financial Screen Referred: No Money Management: Patient Does the patient have any problems obtaining your medications?: No Home Management: Pt's dtrs  and mother can assist with this while she is rehabilitating. Patient/Family Preliminary Plans: Pt plans to return home with her dtrs and her mother and s.o. to help as needed. Social Work Anticipated Follow Up Needs: HH/OP, Support Group Expected length of stay: 7-10 days  Clinical Impression CSW met with pt on 07-14-17 and again 07-15-17 to introduce self and role of CSW, as well as to complete assessment.  Pt and mother were present 07-15-17 and pt approved mother staying for CSW visit.  Pt's mother was very direct with pt about her not driving after d/c and that pt would need to take her medications as directed so that something like this would not happen again.  Pt wants to "be around" to watch her girls grow up and CSW and mother both tried to emphasize that taking care of herself to see this is important and that taking her medications is a big part of that care.  She did not seem to like what we were saying because she doesn't feel she needed that medicines, that she was okay until this happened, but her mother tried to make the connection that this happening may have been because she was not taking the medications.  Pt reluctantly acknowledged what her mother was saying.  Pt appreciated neuropsychologist's visit and would like to see him again.  CSW will refer for f/u prior to d/c.  Pt may not need any DME, but CSW will arrange f/u therapy and PCP visit.  Pt also asked for CSW to check in with her disability attorney.  CSW did so and the office asked that pt call once she is discharged.  CSW will continue to follow and assist as needed.  Consetta Cosner, Silvestre Mesi 07/16/2017, 12:35 PM

## 2017-07-16 NOTE — Plan of Care (Signed)
  Problem: Consults Goal: RH STROKE PATIENT EDUCATION Description See Patient Education module for education specifics  Outcome: Progressing   Problem: RH SAFETY Goal: RH STG ADHERE TO SAFETY PRECAUTIONS W/ASSISTANCE/DEVICE Description STG Adhere to Safety Precautions With min Assistance/Device.  Outcome: Progressing   Problem: RH PAIN MANAGEMENT Goal: RH STG PAIN MANAGED AT OR BELOW PT'S PAIN GOAL Description Pain less than < 3  Outcome: Progressing   Problem: RH KNOWLEDGE DEFICIT Goal: RH STG INCREASE KNOWLEDGE OF HYPERTENSION Description Min assist  Outcome: Progressing   

## 2017-07-16 NOTE — IPOC Note (Signed)
Overall Plan of Care [facility]) Patient Details Name: Lindsey [name] MRN: [id number] DOB: November 12, 1970  Admitting Diagnosis: <principal problem not specified>  Hospital Problems: Active Problems:   Acute ischemic right MCA stroke (HCC)     Functional Problem List: Nursing Endurance, Medication Management, Pain, Safety  PT Balance, Behavior, Pain, Edema, Perception, Endurance, Safety, Motor  OT Balance, Safety, Endurance, Motor  SLP Cognition  TR         Basic ADL's: OT Grooming, Bathing, Dressing, Toileting     Advanced  ADL's: OT Simple Meal Preparation, Laundry, Light Housekeeping     Transfers: PT Bed Mobility, Floor, Bed to Chair, Car, Occupational psychologist, Research scientist (life sciences): PT Stairs, Ambulation     Additional Impairments: OT Fuctional Use of Upper Extremity  SLP Social Cognition   Problem Solving, Memory, Attention, Awareness  TR      Anticipated Outcomes Item Anticipated Outcome  Self Feeding Indep  Swallowing  Mod I   Basic self-care  Indep  Toileting  Indep   Bathroom Transfers Indep  Bowel/Bladder  Mod I  Transfers  mod I with LRAD  Locomotion  supervision<>mod I with LRAD  Communication     Cognition  Mod I  Pain  3 or less  Safety/Judgment  Mod I   Therapy Plan: PT Intensity: Minimum of 1-2 x/day ,45 to 90 minutes PT Frequency: 5 out of 7 days PT Duration Estimated Length of Stay: 7-10 days OT Intensity: Minimum of 1-2 x/day, 45 to 90 minutes OT Frequency: 5 out of 7 days OT Duration/Estimated Length of Stay: 7-10 days SLP Intensity: Minumum of 1-2 x/day, 30 to 90 minutes SLP Frequency: 3 to 5 out of 7 days SLP Duration/Estimated Length of Stay: 7-10 days    Team Interventions: Nursing Interventions Patient/Family Education, Disease Management/Prevention, Pain Management, Medication Management  PT interventions Ambulation/gait training, Cognitive remediation/compensation, Discharge planning, DME/adaptive equipment  instruction, Functional mobility training, Pain management, Psychosocial support, Splinting/orthotics, Therapeutic Activities, UE/LE Strength taining/ROM, Visual/perceptual remediation/compensation, UE/LE Coordination activities, Therapeutic Exercise, Stair training, Patient/family education, Neuromuscular re-education, Disease management/prevention, Firefighter, Warden/ranger  OT Interventions Warden/ranger, Discharge planning, Functional electrical stimulation, Pain management, Self Care/advanced ADL retraining, Therapeutic Activities, UE/LE Coordination activities, Therapeutic Exercise, Patient/family education, Visual/perceptual remediation/compensation, Functional mobility training, Disease mangement/prevention, Cognitive remediation/compensation, Community reintegration, Fish farm manager, Neuromuscular re-education, Psychosocial support, Splinting/orthotics, UE/LE Strength taining/ROM  SLP Interventions Dysphagia/aspiration precaution training  TR Interventions    SW/CM Interventions Discharge Planning, Psychosocial Support, Patient/Family Education   Barriers to Discharge MD  Medical stability  Nursing      PT Decreased caregiver support pt home alone during the day  OT      SLP      SW       Team Discharge Planning: Destination: PT-Home ,OT- Home , SLP-Home Projected Follow-up: PT-Outpatient PT, OT-  Home health OT, SLP-24 hour supervision/assistance, Other (comment), Home Health SLP, Outpatient SLP(HH or outpatient if not at Mod I due to short ELOS) Projected Equipment Needs: PT-To be determined, OT- To be determined, SLP-None recommended by SLP Equipment Details: PT- , OT-  Patient/family involved in discharge planning: PT- Patient,  OT-Patient, SLP-Patient  MD ELOS: 5-9d Medical Rehab Prognosis:  Excellent Assessment:  47 year old female with history of CAD s/p stenting, history of apical mural thrombus with CVA 2016  --residual STM deficits and right field cut, medication non-compliance,  GERD, HTN who was admitted on 07/08/17 with sudden onset of left facial droop and LUE  weakness. History taken from chart review and patient. CT head reviewed, unremarkable for acute intracranial process. Cerebral angio revealing occluded superior division mid 1/3 division of R-MCA with extensive collaterals and spontaneous recanalization therefore no endovascular treatment recommended. MRI brain done revealing right lateral frontal and insula early/subacute infarct without hemorrhage.    2D echo showed fixed, medium apical thrombus with mild LVH, EF 55-60% with grade one DD.   Now requiring 24/7 Rehab RN,MD, as well as CIR level PT, OT and SLP.  Treatment team will focus on ADLs and mobility with goals set at Mod I   See Team Conference Notes for weekly updates to the plan of care

## 2017-07-17 ENCOUNTER — Inpatient Hospital Stay (HOSPITAL_COMMUNITY): Payer: Medicaid Other | Admitting: Speech Pathology

## 2017-07-17 ENCOUNTER — Inpatient Hospital Stay (HOSPITAL_COMMUNITY): Payer: Medicare Other | Admitting: Physical Therapy

## 2017-07-17 ENCOUNTER — Inpatient Hospital Stay (HOSPITAL_COMMUNITY): Payer: Medicaid Other | Admitting: Occupational Therapy

## 2017-07-17 LAB — IRON AND TIBC
IRON: 86 ug/dL (ref 28–170)
Saturation Ratios: 30 % (ref 10.4–31.8)
TIBC: 287 ug/dL (ref 250–450)
UIBC: 201 ug/dL

## 2017-07-17 LAB — CBC
HCT: 33.8 % — ABNORMAL LOW (ref 36.0–46.0)
HEMOGLOBIN: 10.6 g/dL — AB (ref 12.0–15.0)
MCH: 28.1 pg (ref 26.0–34.0)
MCHC: 31.4 g/dL (ref 30.0–36.0)
MCV: 89.7 fL (ref 78.0–100.0)
PLATELETS: 247 10*3/uL (ref 150–400)
RBC: 3.77 MIL/uL — ABNORMAL LOW (ref 3.87–5.11)
RDW: 13.1 % (ref 11.5–15.5)
WBC: 5.1 10*3/uL (ref 4.0–10.5)

## 2017-07-17 NOTE — Progress Notes (Signed)
Subjective/Complaints:   ROS-  No CP, SOB, N/V/D, no bladder issues, + constipation  Objective: Vital Signs: Blood pressure (!) 111/99, pulse (!) 55, temperature 99.1 F (37.3 C), temperature source Oral, resp. rate 18, weight 80.5 kg (177 lb 7.5 oz), SpO2 100 %. No results found. Results for orders placed or performed during the hospital encounter of 07/13/17 (from the past 72 hour(s))  CBC     Status: Abnormal   Collection Time: 07/17/17  5:57 AM  Result Value Ref Range   WBC 5.1 4.0 - 10.5 K/uL   RBC 3.77 (L) 3.87 - 5.11 MIL/uL   Hemoglobin 10.6 (L) 12.0 - 15.0 g/dL   HCT 16.1 (L) 09.6 - 04.5 %   MCV 89.7 78.0 - 100.0 fL   MCH 28.1 26.0 - 34.0 pg   MCHC 31.4 30.0 - 36.0 g/dL   RDW 40.9 81.1 - 91.4 %   Platelets 247 150 - 400 K/uL    Comment: Performed at [facility], [address] [place], [place] [address]     HEENT: normal Cardio: RRR and no murmur Resp: CTA B/L and Unlabored GI: BS positive and NT, ND Extremity:  No Edema Skin:   Intact Neuro: Alert/Oriented, Normal Sensory, Abnormal Motor 3/5 Left Del, Bi, Tri , grip, 4/5 L HF, KE ADF and Abnormal FMC Ataxic/ dec FMC Musc/Skel:  Other no pain with UE or LE ROM Gen NAD   Assessment/Plan: 1. 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 Physiatrist is providing close team supervision and 24 hour management of active medical problems listed below. Physiatrist and rehab team continue to assess barriers to discharge/monitor patient progress toward functional and medical goals. FIM: Function - Bathing Position: Shower Body parts bathed by patient: Right arm, Right lower leg, Left arm, Left lower leg, Chest, Abdomen, Front perineal area, Buttocks, Right upper leg, Left upper leg, Back Body parts bathed by helper: Back Assist Level:  Supervision or verbal cues  Function- Upper Body Dressing/Undressing What is the patient wearing?: Pull over shirt/dress Pull over shirt/dress - Perfomed by patient: Thread/unthread right sleeve, Thread/unthread left sleeve, Put head through opening, Pull shirt over trunk Assist Level: More than reasonable time Set up : To obtain clothing/put away Function - Lower Body Dressing/Undressing What is the patient wearing?: Pants, Non-skid slipper socks Position: Sitting EOB Pants- Performed by patient: Thread/unthread right pants leg, Thread/unthread left pants leg, Pull pants up/down Non-skid slipper socks- Performed by patient: Don/doff right sock, Don/doff left sock Assist for footwear: Supervision/touching assist Assist for lower body dressing: Supervision or verbal cues  Function - Toileting Toileting steps completed by patient: Adjust clothing prior to toileting, Performs perineal hygiene, Adjust clothing after toileting Toileting steps completed by helper: Adjust clothing prior to toileting Toileting Assistive Devices: Grab bar or rail Assist level: Supervision or verbal cues  Function Programmer, multimedia transfer assistive device: Grab bar Assist level to toilet: Supervision or verbal cues Assist level from toilet: Supervision or verbal cues  Function - Chair/bed transfer Chair/bed transfer method: Stand pivot, Ambulatory Chair/bed transfer assist level: Supervision or verbal cues Chair/bed transfer assistive device: Armrests Chair/bed transfer details: Verbal cues for technique  Function - Locomotion: Wheelchair Will patient use wheelchair at discharge?: No Max wheelchair distance: 50 ft  Assist Level: Supervision or verbal cues Assist Level: Supervision or verbal  cues Wheel 150 feet activity did not occur: Safety/medical concerns Turns around,maneuvers to table,bed, and toilet,negotiates 3% grade,maneuvers on rugs and over doorsills: No Function - Locomotion:  Ambulation Assistive device: No device Max distance: 150 ft  Assist level: Supervision or verbal cues Assist level: Supervision or verbal cues Assist level: Supervision or verbal cues Walk 150 feet activity did not occur: Safety/medical concerns Assist level: Supervision or verbal cues Assist level: Supervision or verbal cues  Function - Comprehension Comprehension: Auditory Comprehension assist level: Understands complex 90% of the time/cues 10% of the time  Function - Expression Expression: Verbal Expression assist level: Expresses basic needs/ideas: With no assist  Function - Social Interaction Social Interaction assist level: Interacts appropriately with others with medication or extra time (anti-anxiety, antidepressant).  Function - Problem Solving Problem solving assist level: Solves complex 90% of the time/cues < 10% of the time  Function - Memory Memory assist level: Recognizes or recalls 90% of the time/requires cueing < 10% of the time Patient normally able to recall (first 3 days only): Current season, Location of own room, Staff names and faces, That he or she is in a hospital  Medical Problem List and Plan: 1.  Deficits with mobility, dysarthria, self-care secondary to right lateral frontal and insula early/subacute infarct MCA distribution Cont CIR PT, OT, -   2.  Apical mural thrombus/DVT prophylaxis/Anticoagulation: Pharmaceutical: Xarelto Per Neuro also on clopidigrel 3. Pain Management: tylenol pron 4. Mood: LCSW to follow for evaluation and support.  5. Neuropsych: This patient is capable of making decisions on her own behalf. 6. Skin/Wound Care: routine pressure relief measures.  7. Fluids/Electrolytes/Nutrition: Monitor I/O--check lytes in am. 8. HTN: Monitor bid. Continue Coreg bid Vitals:   07/16/17 0500 07/17/17 0557  BP: 108/75 (!) 111/99  Pulse: (!) 54 (!) 55  Resp: 18   Temp: 98.3 F (36.8 C) 99.1 F (37.3 C)  SpO2: 99% 100%  Controlled 5/3  diastolic elevated this am, cont coreg 25mg  BID 9. Headaches: Report constant piercing/stabbing pain. Controlled after increase  patient to BID topamax.  10.  Constipation improved on Senna 11.  Hx of visual field cut after prior CVA 3 yr ago, restricted driving per Optho- discussed, bedside confrontation nl  re eval as outpt 12.  Anemia- Hgb stable, check guaic, pt states she used to take Fe at home, check Fe studies LOS (Days) 4 A FACE TO FACE EVALUATION WAS PERFORMED  Erick Colace 07/17/2017, 7:52 AM

## 2017-07-17 NOTE — Plan of Care (Signed)
  Problem: Consults Goal: RH STROKE PATIENT EDUCATION Description See Patient Education module for education specifics  Outcome: Progressing   Problem: RH SAFETY Goal: RH STG ADHERE TO SAFETY PRECAUTIONS W/ASSISTANCE/DEVICE Description STG Adhere to Safety Precautions With min Assistance/Device.  Outcome: Progressing   Problem: RH PAIN MANAGEMENT Goal: RH STG PAIN MANAGED AT OR BELOW PT'S PAIN GOAL Description Pain less than < 3  Outcome: Progressing

## 2017-07-17 NOTE — Progress Notes (Signed)
Physical Therapy Session Note  Patient Details  Name: Lindsey [name] MRN: [id number] Date of Birth: Jul 29, 1970  Today's Date: 07/17/2017 PT Individual Time: 0803-0900 and 7972-8206 PT Individual Time Calculation (min): 57 min and 39 min  Short Term Goals: Week 1:  PT Short Term Goal 1 (Week 1): STG = LTG due to short ELOS.  Skilled Therapeutic Interventions/Progress Updates:  Treatment 1: Pt received in bed & agreeable to tx. No c/o pain reported. Pt ambulates throughout unit without AD & distant supervision. Pt completed supine bridging with adductor + 5 second hold then LLE single leg bridging with cuing for technique with task focusing on BLE strengthening & NMR. Pt completed bird dog exercises from quadruped position with focus being on core strengthening and balance. High level balance addressed with pt performing braiding and backwards walking with steady assist. Pt engaged in removing and replacing lids on medicine bottles of various sizes & type (screw top, pop top, etc) with LUE for L NMR as pt reports she has trouble holding things. Pt then engaged in clothespin task with blue & black pins for most resistance with task focusing on LUE strengthening & NMR. Throughout session therapist educated pt on CVA & residual affects. Pt also demonstrates poor short term memory and sustained attention to task requiring frequent cuing during session. At end of session pt left in bed with alarm set & all needs within reach.   Treatment 2: Pt received in room & agreeable to tx. No c/o pain reported. Therapist made aware that pt's mother has purchased a RW for pt upon pt request. Pt states that she felt like she needed one due to her poor balance. Therapist thoroughly educates pt on ability to ambulate without RW as she currently ambulates without AD with distant supervision. Pt asking about ambulating outside and therapist made recommendations to have someone ambulating with her and to stay on even concrete  sidewalk. Also discussed scenarios in which she should call 911 if she experiences a fall and discussed need to minimize number of times she negotiates stairs during the day, as pt has full flight to 2nd floor bedroom. Pt ambulates throughout unit without AD with distant supervision. Pt utilized [employer] Limits of Stability and ball toss game without UE support with task focusing on ankle strategies and weight shifting; pt with reduced ankle strategies and decreased ability to weight shift anteriorly. Pt negotiated 20 steps with L rail to simulate full flight of stairs at home; pt did so with supervision. At end of session pt left in bed with alarm set & all needs within reach, visitor present.   Therapy Documentation Precautions:  Precautions Precautions: Fall Restrictions Weight Bearing Restrictions: No   See Function Navigator for Current Functional Status.   Therapy/Group: Individual Therapy  Sandi Mariscal 07/17/2017, 4:31 PM

## 2017-07-17 NOTE — Plan of Care (Signed)
  Problem: Consults Goal: RH STROKE PATIENT EDUCATION Description See Patient Education module for education specifics  Outcome: Progressing   Problem: RH SAFETY Goal: RH STG ADHERE TO SAFETY PRECAUTIONS W/ASSISTANCE/DEVICE Description STG Adhere to Safety Precautions With min Assistance/Device.  Outcome: Progressing   Problem: RH PAIN MANAGEMENT Goal: RH STG PAIN MANAGED AT OR BELOW PT'S PAIN GOAL Description Pain less than < 3  Outcome: Progressing   Problem: RH KNOWLEDGE DEFICIT Goal: RH STG INCREASE KNOWLEDGE OF HYPERTENSION Description Min assist  Outcome: Progressing   

## 2017-07-17 NOTE — Progress Notes (Signed)
Speech Language Pathology Daily Session Note  Patient Details  Name: Lindsey [name] MRN: [id number] Date of Birth: 11-12-1970  Today's Date: 07/17/2017 SLP Individual Time: 8270-7867 SLP Individual Time Calculation (min): 45 min  Short Term Goals: Week 1: SLP Short Term Goal 1 (Week 1): Pt will recall, complex novel and daily information with Mod I use of compensatory strategies. SLP Short Term Goal 2 (Week 1): Pt will demonstrate selective attention during functional tasks in modertately distracting environments at Mod I. SLP Short Term Goal 3 (Week 1): Pt will demonstrate fucntional problem solving for semi-complex tasks at Mod I. SLP Short Term Goal 4 (Week 1): Pt will self-monitor and correct functional errors at Mod I. SLP Short Term Goal 5 (Week 1): Pt will scan left of midline during functional task at Mod I. SLP Short Term Goal 6 (Week 1): Pt will consume regular textured soilds and thin liquids with Mod I use of swallow strategies for oral clearance.  Skilled Therapeutic Interventions: Skilled treatment session focused on cognition goals. SLP facilitated session by providing supervision questions cues to create motivational poster to keep by medicines. Pt expresses overall depression with having take medicine and avoidance d/t depression of being young and taking medicines. Support given and posters left with pt to decorate with colored pencils. Pt appeared excited about concept. Pt left upright in bed, all needs within reach. Continue per current plan of care.      Function:    Cognition Comprehension Comprehension assist level: Understands complex 90% of the time/cues 10% of the time  Expression   Expression assist level: Expresses basic needs/ideas: With no assist;Expresses complex 90% of the time/cues < 10% of the time  Social Interaction Social Interaction assist level: Interacts appropriately with others with medication or extra time (anti-anxiety, antidepressant).  Problem  Solving Problem solving assist level: Solves complex 90% of the time/cues < 10% of the time  Memory Memory assist level: More than reasonable amount of time    Pain Pain Assessment Pain Score: 2  Faces Pain Scale: Hurts a little bit PAINAD (Pain Assessment in Advanced Dementia) Breathing: normal Negative Vocalization: none Facial Expression: smiling or inexpressive Body Language: relaxed Consolability: no need to console PAINAD Score: 0  Therapy/Group: Individual Therapy  Bella Brummet 07/17/2017, 10:02 AM

## 2017-07-17 NOTE — Progress Notes (Signed)
Occupational Therapy Session Note  Patient Details  Name: Lindsey [name] MRN: [id number] Date of Birth: 18-Sep-1970  Today's Date: 07/17/2017 OT Individual Time: 7867-5449 OT Individual Time Calculation (min): 60 min    Short Term Goals: Week 1:  OT Short Term Goal 1 (Week 1): STG=LTG due to LOS  Skilled Therapeutic Interventions/Progress Updates:    Pt seen for OT ADL bathing/dressing session. Pt in supine upon arrival, voicing fatigue from previous tx session but willing to participate in therapy session. She ambulated throughout session with supervision, no AD/ She gathered clothing items from drawers, using B UEs at indpendently level. She bathed standing in shower to address dynamic balance and to build functional activity tolerance, cont to recommend seated bathing tasks at d/c for increased safety to be at mod I level as well as energy conservation. Pt required VCs for problem solving/awareness during bathing task as pt gathered dry towel and began to attempt to dry off while still standing under running shower head. When questioning cues provided, pt stated she was attempting to keep warm while also drying off.  She dressed from standing position, leaning onto wall for support and righting reactions used appropriately at independent level. She completed grooming tasks standing at sink, maintaining dynamic standing balance while using B UEs overhead to fix hair.  Completed IADL house making task, stripping bed in room, gathering clean linene sand making bed. Pt required max cuing for problem solving orientation of fitted sheet. Completed remainder of task with supervision.  Pt returned to recliner at end of session, left seated with chair alarm on and all needs in reach.     Therapy Documentation Precautions:  Precautions Precautions: Fall Restrictions Weight Bearing Restrictions: No Pain:   No/denies pain  See Function Navigator for Current Functional Status.   Therapy/Group:  Individual Therapy  Conley Delisle L 07/17/2017, 7:10 AM

## 2017-07-18 ENCOUNTER — Inpatient Hospital Stay (HOSPITAL_COMMUNITY): Payer: Medicaid Other

## 2017-07-18 ENCOUNTER — Inpatient Hospital Stay (HOSPITAL_COMMUNITY): Payer: Medicaid Other | Admitting: Physical Therapy

## 2017-07-18 ENCOUNTER — Inpatient Hospital Stay (HOSPITAL_COMMUNITY): Payer: Medicaid Other | Admitting: Speech Pathology

## 2017-07-18 DIAGNOSIS — G441 Vascular headache, not elsewhere classified: Secondary | ICD-10-CM

## 2017-07-18 DIAGNOSIS — I63511 Cerebral infarction due to unspecified occlusion or stenosis of right middle cerebral artery: Secondary | ICD-10-CM

## 2017-07-18 DIAGNOSIS — I10 Essential (primary) hypertension: Secondary | ICD-10-CM

## 2017-07-18 DIAGNOSIS — D62 Acute posthemorrhagic anemia: Secondary | ICD-10-CM

## 2017-07-18 DIAGNOSIS — I69354 Hemiplegia and hemiparesis following cerebral infarction affecting left non-dominant side: Principal | ICD-10-CM

## 2017-07-18 NOTE — Progress Notes (Signed)
Physical Therapy Session Note  Patient Details  Name: Lindsey [name] MRN: [id number] Date of Birth: 06/15/1970  Today's Date: 07/18/2017 PT Individual Time: 1100-1200 AND 1500-1530 PT Individual Time Calculation (min): 60 min AND 30 min  Short Term Goals: Week 1:  PT Short Term Goal 1 (Week 1): STG = LTG due to short ELOS.  Skilled Therapeutic Interventions/Progress Updates:   Session 1:  Pt in supine and agreeable to therapy, denies pain. Pt ambulated around unit w/ supervision during session, no AD. Worked on dynamic standing balance and standing endurance while playing Wii [employer]. Supervision overall w/ occasional verbal cues for safety. Pt was able to stand 20+ minutes while playing w/o seated rest break. Performed NuStep 20 min @ level 4 to work on endurance. During NuStep, educated pt on stroke risk, stroke prevention and lifestyle modifications. Focused on maintaining an internal locus of control in regards to her health and wellbeing. Pt appreciative of conversation. Returned to room and ended session in supine, call bell within reach and all needs met.   Session 2: Pt in supine and agreeable to therapy, denies pain. Worked on community ambulation this session. Ambulated >150' w/ supervision to outside and negotiated uneven surfaces, steps w/o rails, and curbs. Occasional verbal cues for safety awareness and attention to task in busier environment. 1 brief rest break. Returned to room and ended session in recliner, call bell within reach and all needs met. Chair alarm engaged.   Therapy Documentation Precautions:  Precautions Precautions: Fall Restrictions Weight Bearing Restrictions: No  See Function Navigator for Current Functional Status.   Therapy/Group: Individual Therapy  Akshaj Besancon K Arnette 07/18/2017, 1:15 PM
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
## 2017-07-18 NOTE — Progress Notes (Signed)
Speech Language Pathology Daily Session Note  Patient Details  Name: Lindsey [name] MRN: [id number] Date of Birth: 05-Oct-1970  Today's Date: 07/18/2017 SLP Individual Time: 1330-1400 SLP Individual Time Calculation (min): 30 min  Short Term Goals: Week 1: SLP Short Term Goal 1 (Week 1): Pt will recall, complex novel and daily information with Mod I use of compensatory strategies. SLP Short Term Goal 2 (Week 1): Pt will demonstrate selective attention during functional tasks in modertately distracting environments at Mod I. SLP Short Term Goal 3 (Week 1): Pt will demonstrate fucntional problem solving for semi-complex tasks at Mod I. SLP Short Term Goal 4 (Week 1): Pt will self-monitor and correct functional errors at Mod I. SLP Short Term Goal 5 (Week 1): Pt will scan left of midline during functional task at Mod I. SLP Short Term Goal 6 (Week 1): Pt will consume regular textured soilds and thin liquids with Mod I use of swallow strategies for oral clearance.  Skilled Therapeutic Interventions: Skilled treatment session focused on cognition goals. SLP facilitated session by providing supervision cues to complete posters with motivational phrases. Pt states that she wants to "go for a walk" when SLP explained current goals then she stated that she "wanted to talk." Pt is able to complete semi to complex tasks and demonstrates ability to recall high level information. She frequently requests to "talk about depression regarding the reason why she isn't taking medicines." On occasion, it is difficult to assess true cognitive dysfunction from possible attention seeking behaviors. It doesn't clinically make sense that she is able to perform high level tasks but then make simple errors such as not finishing her posters. Pt was left upright in bed with all needs within reach. Continue per current plan of care.      Function:  Eating Eating   Modified Consistency Diet: No Eating Assist Level: More than  reasonable amount of time   Eating Set Up Assist For: Opening containers       Cognition Comprehension Comprehension assist level: Understands complex 90% of the time/cues 10% of the time  Expression   Expression assist level: Expresses complex 90% of the time/cues < 10% of the time;Expresses complex ideas: With extra time/assistive device  Social Interaction Social Interaction assist level: Interacts appropriately with others with medication or extra time (anti-anxiety, antidepressant).  Problem Solving Problem solving assist level: Solves complex 90% of the time/cues < 10% of the time;Solves complex problems: With extra time  Memory Memory assist level: More than reasonable amount of time    Pain    Therapy/Group: Individual Therapy  Earnestine Tuohey 07/18/2017, 2:02 PM

## 2017-07-18 NOTE — Progress Notes (Signed)
Occupational Therapy Session Note  Patient Details  Name: Lindsey [name] MRN: [id number] Date of Birth: 09/01/1970  Today's Date: 07/18/2017 OT Individual Time: 0623-7628 OT Individual Time Calculation (min): 75 min    Short Term Goals: Week 1:  OT Short Term Goal 1 (Week 1): STG=LTG due to LOS  Skilled Therapeutic Interventions/Progress Updates:    1:1. Focus of session initially on grooming at the sink in standing for balance and RUE use with supervision. Pt ambulates without AD throughout session. Pt agreeable to engage in performance analysis while completing two tasks of making the bed and sweeping the floor. Pt wash able to complete both task with minimal increased effort d/t decreased manipulation/coordination skills and minor unsteadiness on feet, however pt demonstrated significantly diminished process skills such as noticing/responding, searching/locating, organization, choosing needed items, termination, attention, and sequencing requiring question cues to problem solve fitted sheet. Exited session with pt seated in bed, call light in reach, exit alarm on and all needs met.  Therapy Documentation Precautions:  Precautions Precautions: Fall Restrictions Weight Bearing Restrictions: No  See Function Navigator for Current Functional Status.   Therapy/Group: Individual Therapy  Tonny Branch 07/18/2017, 8:13 AM

## 2017-07-19 ENCOUNTER — Inpatient Hospital Stay (HOSPITAL_COMMUNITY): Payer: Medicaid Other

## 2017-07-19 NOTE — Progress Notes (Signed)
Subjective/Complaints: Patient seen lying in bed this morning. She states she slept better overnight.  ROS-  Denies CP, SOB, N/V/D  Objective: Vital Signs: Blood pressure 119/80, pulse 61, temperature 98.3 F (36.8 C), temperature source Oral, resp. rate 18, weight 80.5 kg (177 lb 7.5 oz), SpO2 100 %. No results found. Results for orders placed or performed during the hospital encounter of 07/13/17 (from the past 72 hour(s))  CBC     Status: Abnormal   Collection Time: 07/17/17  5:57 AM  Result Value Ref Range   WBC 5.1 4.0 - 10.5 K/uL   RBC 3.77 (L) 3.87 - 5.11 MIL/uL   Hemoglobin 10.6 (L) 12.0 - 15.0 g/dL   HCT 81.1 (L) 91.4 - 78.2 %   MCV 89.7 78.0 - 100.0 fL   MCH 28.1 26.0 - 34.0 pg   MCHC 31.4 30.0 - 36.0 g/dL   RDW 95.6 21.3 - 08.6 %   Platelets 247 150 - 400 K/uL    Comment: Performed at [facility], [address], [place], [place] [address]  Iron and TIBC     Status: None   Collection Time: 07/17/17  9:08 AM  Result Value Ref Range   Iron 86 28 - 170 ug/dL   TIBC 962 952 - 841 ug/dL   Saturation Ratios 30 10.4 - 31.8 %   UIBC 201 ug/dL    Comment: Performed at [facility], [address], [place], [place] [address]     Constitutional: No distress . Vital signs reviewed. HENT: Normocephalic.  Atraumatic. Eyes: EOMI. No discharge. Cardiovascular: RRR. No JVD. Respiratory: CTA Bilaterally. Normal effort. GI: BS +. Non-distended. Musc: No edema or tenderness in extremities. Skin:   Intact. Warm and dry. Neuro: Alert/Oriented Motor: 4+/5 Left Del, Bi, Tri , grip, 4+/5 L HF, KE ADF (stable)  Assessment/Plan: 1. 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 Physiatrist is providing close team supervision and 24 hour management of active medical problems listed below. Physiatrist  and rehab team continue to assess barriers to discharge/monitor patient progress toward functional and medical goals. FIM: Function - Bathing Position: Shower(Standing) Body parts bathed by patient: Right arm, Right lower leg, Left arm, Left lower leg, Chest, Abdomen, Front perineal area, Buttocks, Right upper leg, Left upper leg, Back Body parts bathed by helper: Back Assist Level: Supervision or verbal cues  Function- Upper Body Dressing/Undressing What is the patient wearing?: Pull over shirt/dress Pull over shirt/dress - Perfomed by patient: Thread/unthread right sleeve, Thread/unthread left sleeve, Put head through opening, Pull shirt over trunk Assist Level: Supervision or verbal cues Set up : To obtain clothing/put away Function - Lower Body Dressing/Undressing What is the patient wearing?: Pants, Non-skid slipper socks Position: (Standing in bathroom) Pants- Performed by patient: Thread/unthread right pants leg, Thread/unthread left pants leg, Pull pants up/down Non-skid slipper socks- Performed by patient: Don/doff right sock, Don/doff left sock Assist for footwear: Independent Assist for lower body dressing: Supervision or verbal cues  Function - Toileting Toileting steps completed by patient: Adjust clothing prior to toileting, Performs perineal hygiene, Adjust clothing after toileting Toileting steps completed by helper: Adjust clothing prior to toileting Toileting Assistive Devices: Grab bar or rail Assist level: Supervision or verbal cues  Function - Archivist transfer assistive device: Grab bar Assist level to toilet: Supervision or  verbal cues Assist level from toilet: Supervision or verbal cues  Function - Chair/bed transfer Chair/bed transfer method: Stand pivot, Ambulatory Chair/bed transfer assist level: Supervision or verbal cues Chair/bed transfer assistive device: Armrests Chair/bed transfer details: Verbal cues for technique  Function -  Locomotion: Wheelchair Will patient use wheelchair at discharge?: No Max wheelchair distance: 50 ft  Assist Level: Supervision or verbal cues Assist Level: Supervision or verbal cues Wheel 150 feet activity did not occur: Safety/medical concerns Turns around,maneuvers to table,bed, and toilet,negotiates 3% grade,maneuvers on rugs and over doorsills: No Function - Locomotion: Ambulation Assistive device: No device Max distance: 150 ft Assist level: Supervision or verbal cues Assist level: Supervision or verbal cues Assist level: Supervision or verbal cues Walk 150 feet activity did not occur: Safety/medical concerns Assist level: Supervision or verbal cues Assist level: Supervision or verbal cues  Function - Comprehension Comprehension: Auditory Comprehension assist level: Understands complex 90% of the time/cues 10% of the time  Function - Expression Expression: Verbal Expression assist level: Expresses basic needs/ideas: With no assist, Expresses complex 90% of the time/cues < 10% of the time  Function - Social Interaction Social Interaction assist level: Interacts appropriately with others with medication or extra time (anti-anxiety, antidepressant).  Function - Problem Solving Problem solving assist level: Solves complex 90% of the time/cues < 10% of the time  Function - Memory Memory assist level: More than reasonable amount of time Patient normally able to recall (first 3 days only): Current season, Location of own room, Staff names and faces, That he or she is in a hospital  Medical Problem List and Plan: 1.  Deficits with mobility, dysarthria, self-care secondary to right lateral frontal and insula early/subacute infarct MCA distribution   Cont CIR   2.  Apical mural thrombus/DVT prophylaxis/Anticoagulation: Pharmaceutical: Xarelto  Per Neuro also on clopidigrel 3. Pain Management: tylenol pron 4. Mood: LCSW to follow for evaluation and support.  5. Neuropsych: This  patient is capable of making decisions on her own behalf. 6. Skin/Wound Care: routine pressure relief measures.  7. Fluids/Electrolytes/Nutrition: Monitor I/Os. 8. HTN: Monitor bid. Continue Coreg bid Vitals:   07/18/17 1428 07/19/17 0626  BP: 116/80 119/80  Pulse: (!) 57 61  Resp: 17 18  Temp: 98.2 F (36.8 C) 98.3 F (36.8 C)  SpO2: 100% 100%    Controlled 5/5 9. Headaches: Report constant piercing/stabbing pain.   Controlled after increase patient to BID topamax.  10.  Constipation improved on Senna 11.  Hx of visual field cut after prior CVA 3 yr ago, restricted driving per Optho, re eval as outpt 12.  Anemia- Hgb 10.6 on 5/3, stable   Hemoccult remains pending  Iron studies within normal limits on 5/3  LOS (Days) 6 A FACE TO FACE EVALUATION WAS PERFORMED  Ankit Karis Juba 07/19/2017, 7:12 AM

## 2017-07-19 NOTE — Progress Notes (Signed)
Occupational Therapy Session Note  Patient Details  Name: Lindsey [name] MRN: [id number] Date of Birth: 1970-05-28  Today's Date: 07/19/2017 OT Individual Time: 1500-1555 OT Individual Time Calculation (min): 55 min    Short Term Goals: Week 1:  OT Short Term Goal 1 (Week 1): STG=LTG due to LOS  Skilled Therapeutic Interventions/Progress Updates:    1:1. Pt with no complaints of pain. Pt completes ambulation without AD at community moility distances with no rest breaks or LOB but requires cueing for longer strides and arm swing. Pt completes simple cooking task of maiking jello boiling water on stove and cleaning dishes with supervision. Pt requires cueing and extra time d/t decreased organization using tools and time. Pt has to regather needed tools that are prematurely put away and requires cueing to locate all items that must be cleaned. Pt does not need cueing to turn off stove but there is a delay while washing dishes that stove is still on. Pt ambulates to outside courtard and discusses concerns about going home. Pt reports most concerned about steps. Pt uses external aides to locate room with MOD Vc and ascends 2 flights of stairs with no rest breaks. This reportedly makes pt feel better about going home. Exited session with pt seated in bed, call ight in reach and bed exit alarm on  Therapy Documentation Precautions:  Precautions Precautions: Fall Restrictions Weight Bearing Restrictions: No General:   Vital Signs: Therapy Vitals Temp: 98.8 F (37.1 C) Temp Source: Oral Pulse Rate: 62 Resp: 18 BP: 118/85 Patient Position (if appropriate): Lying Oxygen Therapy SpO2: 100 % O2 Device: Room Air  See Function Navigator for Current Functional Status.   Therapy/Group: Individual Therapy  Shon Hale 07/19/2017, 3:57 PM

## 2017-07-20 ENCOUNTER — Inpatient Hospital Stay (HOSPITAL_COMMUNITY): Payer: Medicaid Other

## 2017-07-20 ENCOUNTER — Inpatient Hospital Stay (HOSPITAL_COMMUNITY): Payer: Medicaid Other | Admitting: Occupational Therapy

## 2017-07-20 ENCOUNTER — Inpatient Hospital Stay (HOSPITAL_COMMUNITY): Payer: Medicare Other | Admitting: Physical Therapy

## 2017-07-20 DIAGNOSIS — F4321 Adjustment disorder with depressed mood: Secondary | ICD-10-CM

## 2017-07-20 NOTE — Progress Notes (Signed)
Occupational Therapy Discharge Summary  Patient Details  Name: Lindsey [name] MRN: [id number] Date of Birth: 1971-03-05  Patient has met 62 of 61 long term goals due to improved activity tolerance, improved balance, postural control, ability to compensate for deficits, functional use of  LEFT upper extremity, improved attention, improved awareness and improved coordination.  Patient to discharge at overall Modified Independent level.  Patient lives with mother an daughters who are able to provide PRN assist. Pt will be alone during day, safety considerations reviewed with pt and caregivers.    Recommendation:  Patient will benefit from ongoing skilled OT services in home health setting to continue to advance functional skills in the area of BADL, iADL and Vocation.  Equipment: Shower chair  Reasons for discharge: treatment goals met and discharge from hospital  Patient/family agrees with progress made and goals achieved: Yes  OT Discharge Precautions/Restrictions  Precautions Precautions: Fall Restrictions Weight Bearing Restrictions: No Vision Baseline Vision/History: Wears glasses Wears Glasses: At all times Patient Visual Report: Peripheral vision impairment;No change from baseline(Peripheral impaired from previous CVA) Visual Fields: No apparent deficits Perception  Perception: Within Functional Limits Praxis Praxis: Intact Cognition Overall Cognitive Status: Within Functional Limits for tasks assessed Arousal/Alertness: Awake/alert Orientation Level: Oriented X4 Attention: Divided Divided Attention: Impaired Divided Attention Impairment: Verbal complex;Functional complex Memory: Impaired Memory Impairment: Decreased recall of new information Problem Solving: Appears intact Safety/Judgment: Appears intact Sensation Sensation Light Touch: Appears Intact Hot/Cold: Appears Intact Proprioception: Appears Intact Coordination Gross Motor Movements are Fluid and  Coordinated: Yes Fine Motor Movements are Fluid and Coordinated: Yes Coordination and Movement Description: Mild L hemiplegia Finger Nose Finger Test: WFL B UEs 9 Hole Peg Test: R: 34.41, 38.2, and 25.1 L: 60.18, 41.2 and 43.3 Motor  Motor Motor: Hemiplegia Motor - Skilled Clinical Observations: L hemi (mild), generalized weakness Motor - Discharge Observations: L hemi (mild), generalized weakness Mobility  Bed Mobility Bed Mobility: Rolling Right;Rolling Left;Supine to Sit;Sit to Supine Rolling Right: 7: Independent Rolling Left: 7: Independent Supine to Sit: 7: Independent Sit to Supine: 7: Independent Transfers Sit to Stand: 6: Modified independent (Device/Increase time) Stand to Sit: 6: Modified independent (Device/Increase time)  Trunk/Postural Assessment  Cervical Assessment Cervical Assessment: Within Functional Limits Thoracic Assessment Thoracic Assessment: Within Functional Limits Lumbar Assessment Lumbar Assessment: Within Functional Limits  Balance Balance Balance Assessed: Yes Standardized Balance Assessment Standardized Balance Assessment: Berg Balance Test;Timed Up and Go Test Berg Balance Test Sit to Stand: Able to stand without using hands and stabilize independently Standing Unsupported: Able to stand safely 2 minutes Sitting with Back Unsupported but Feet Supported on Floor or Stool: Able to sit safely and securely 2 minutes Stand to Sit: Sits safely with minimal use of hands Transfers: Able to transfer safely, minor use of hands Standing Unsupported with Eyes Closed: Able to stand 10 seconds safely Standing Ubsupported with Feet Together: Able to place feet together independently and stand 1 minute safely From Standing, Reach Forward with Outstretched Arm: Can reach confidently >25 cm (10") From Standing Position, Pick up Object from Floor: Able to pick up shoe safely and easily From Standing Position, Turn to Look Behind Over each Shoulder: Looks behind  from both sides and weight shifts well Turn 360 Degrees: Able to turn 360 degrees safely in 4 seconds or less Standing Unsupported, Alternately Place Feet on Step/Stool: Able to stand independently and safely and complete 8 steps in 20 seconds Standing Unsupported, One Foot in Front: Able to place foot tandem independently and hold  30 seconds Standing on One Leg: Able to lift leg independently and hold 5-10 seconds Total Score: 55 Timed Up and Go Test TUG: Normal TUG;Cognitive TUG Normal TUG (seconds): 12.29(average of 3 trials) Cognitive TUG (seconds): 18.73(average of 3 trials; counting backwards by 3's) Dynamic Sitting Balance Dynamic Sitting - Balance Support: During functional activity;Feet supported Dynamic Sitting - Level of Assistance: 6: Modified independent (Device/Increase time) Static Standing Balance Static Standing - Balance Support: During functional activity;No upper extremity supported Static Standing - Level of Assistance: 6: Modified independent (Device/Increase time) Dynamic Standing Balance Dynamic Standing - Balance Support: During functional activity;No upper extremity supported Dynamic Standing - Level of Assistance: 6: Modified independent (Device/Increase time) Extremity/Trunk Assessment RUE Assessment RUE Assessment: Within Functional Limits LUE Assessment LUE Assessment: Within Functional Limits(4+/5 throughout)   See Function Navigator for Current Functional Status.  Margie Urbanowicz L 07/20/2017, 12:50 PM

## 2017-07-20 NOTE — Progress Notes (Signed)
PRN tylenol and trazodone given at HS, past 2 nights for HA and sleep. Reports sleeping much better. Alfredo Martinez A

## 2017-07-20 NOTE — Progress Notes (Addendum)
Occupational Therapy Session Note  Patient Details  Name: Lindsey [name] MRN: [id number] Date of Birth: 12/09/1970  Today's Date: 07/20/2017 OT Individual Time: 7414-2395 OT Individual Time Calculation (min): 60 min    Short Term Goals: Week 1:  OT Short Term Goal 1 (Week 1): STG=LTG due to LOS  Skilled Therapeutic Interventions/Progress Updates:    Pt seen for OT ADL bathing/dressing session. Pt asleep in supine upon arrival, easily awoken and agreeable to tx session. She ambulated throughout session at mod I level, no AD. Gathered and prepared items for showering task requring slightly increased time due to scattered thoughts.  She bathed sit/stand at mod I level, returning to bed room to dress from standing position. She cleaned up dirty linens from shower, bending towards floor to pick up items independently.  She ambulated to therapy gym, completed 9 hole peg test, see results below. Practiced floor transfer in simulation of fall recovery. Education proivded regarding what to do in case, and technique for fall recovery, completed at supervision level.  Pt desiring to practice steps, completed up/down 1 complete flight with supervision, VCs for lead foot when descending. Completed moderately complex peg replication while standing on wedge foamed mat for stretch and balance challenge- completed activity at mod I level. Pt returned to room at end of session, left sitting up in bed upon arrival, all needs in reach. Pt made mod I in room in prep for d/c home tomorrow at mod I level.   9 hole peg test:  R: 34.41, 38.2, and 25.1 L: 60.18, 41.2 and 43.3   Therapy Documentation Precautions:  Precautions Precautions: Fall Restrictions Weight Bearing Restrictions: No Pain:   No/denies pain  See Function Navigator for Current Functional Status.   Therapy/Group: Individual Therapy  Cambell Stanek L 07/20/2017, 7:13 AM

## 2017-07-20 NOTE — Progress Notes (Signed)
Subjective/Complaints:  No issues overnite  ROS-  Denies CP, SOB, N/V/D  Objective: Vital Signs: Blood pressure 122/89, pulse (!) 58, temperature 97.8 F (36.6 C), temperature source Oral, resp. rate 18, weight 80.5 kg (177 lb 7.5 oz), SpO2 100 %. No results found. Results for orders placed or performed during the hospital encounter of 07/13/17 (from the past 72 hour(s))  Iron and TIBC     Status: None   Collection Time: 07/17/17  9:08 AM  Result Value Ref Range   Iron 86 28 - 170 ug/dL   TIBC 035 465 - 681 ug/dL   Saturation Ratios 30 10.4 - 31.8 %   UIBC 201 ug/dL    Comment: Performed at [facility], [address] [place], [place] [address]     Constitutional: No distress . Vital signs reviewed. HENT: Normocephalic.  Atraumatic. Eyes: EOMI. No discharge. Cardiovascular: RRR. No JVD. Respiratory: CTA Bilaterally. Normal effort. GI: BS +. Non-distended. Musc: No edema or tenderness in extremities. Skin:   Intact. Warm and dry. Neuro: Alert/Oriented Motor: 4+/5 Left Del, Bi, Tri , grip, 4+/5 L HF, KE ADF (stable)  Assessment/Plan: 1. 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 Physiatrist is providing close team supervision and 24 hour management of active medical problems listed below. Physiatrist and rehab team continue to assess barriers to discharge/monitor patient progress toward functional and medical goals. FIM: Function - Bathing Position: Shower(Standing) Body parts bathed by patient: Right arm, Right lower leg, Left arm, Left lower leg, Chest, Abdomen, Front perineal area, Buttocks, Right upper leg, Left upper leg, Back Body parts bathed by helper: Back Assist Level: Supervision or verbal cues  Function- Upper Body Dressing/Undressing What is the patient wearing?: Pull over  shirt/dress Pull over shirt/dress - Perfomed by patient: Thread/unthread right sleeve, Thread/unthread left sleeve, Put head through opening, Pull shirt over trunk Assist Level: Supervision or verbal cues Set up : To obtain clothing/put away Function - Lower Body Dressing/Undressing What is the patient wearing?: Pants, Non-skid slipper socks Position: (Standing in bathroom) Pants- Performed by patient: Thread/unthread right pants leg, Thread/unthread left pants leg, Pull pants up/down Non-skid slipper socks- Performed by patient: Don/doff right sock, Don/doff left sock Assist for footwear: Independent Assist for lower body dressing: Supervision or verbal cues  Function - Toileting Toileting steps completed by patient: Adjust clothing prior to toileting, Performs perineal hygiene, Adjust clothing after toileting Toileting steps completed by helper: Adjust clothing prior to toileting Toileting Assistive Devices: Grab bar or rail Assist level: Supervision or verbal cues  Function Programmer, multimedia transfer assistive device: Grab bar Assist level to toilet: Supervision or verbal cues Assist level from toilet: Supervision or verbal cues  Function - Chair/bed transfer Chair/bed transfer method: Stand pivot, Ambulatory Chair/bed transfer assist level: Supervision or verbal cues Chair/bed transfer assistive device: Armrests Chair/bed transfer details: Verbal cues for technique  Function - Locomotion: Wheelchair Will patient use wheelchair at discharge?: No Max wheelchair distance: 50 ft  Assist Level: Supervision or verbal cues Assist Level: Supervision or verbal cues Wheel 150 feet activity did not occur: Safety/medical concerns Turns around,maneuvers to table,bed, and toilet,negotiates 3% grade,maneuvers on rugs and over doorsills: No Function - Locomotion: Ambulation Assistive device: No device Max distance: 150 ft Assist level: Supervision or verbal cues Assist level:  Supervision or verbal cues Assist level:  Supervision or verbal cues Walk 150 feet activity did not occur: Safety/medical concerns Assist level: Supervision or verbal cues Assist level: Supervision or verbal cues  Function - Comprehension Comprehension: Auditory Comprehension assist level: Understands complex 90% of the time/cues 10% of the time  Function - Expression Expression: Verbal Expression assist level: Expresses basic needs/ideas: With no assist, Expresses complex 90% of the time/cues < 10% of the time  Function - Social Interaction Social Interaction assist level: Interacts appropriately with others with medication or extra time (anti-anxiety, antidepressant).  Function - Problem Solving Problem solving assist level: Solves complex 90% of the time/cues < 10% of the time  Function - Memory Memory assist level: More than reasonable amount of time Patient normally able to recall (first 3 days only): Current season, Location of own room, Staff names and faces, That he or she is in a hospital  Medical Problem List and Plan: 1.  Deficits with mobility, dysarthria, self-care secondary to right lateral frontal and insula early/subacute infarct MCA distribution   Cont CIR, plan D/C in am   2.  Apical mural thrombus/DVT prophylaxis/Anticoagulation: Pharmaceutical: Xarelto  Per Neuro also on clopidigrel 3. Pain Management: tylenol pron 4. Mood: LCSW to follow for evaluation and support.  5. Neuropsych: This patient is capable of making decisions on her own behalf. 6. Skin/Wound Care: routine pressure relief measures.  7. Fluids/Electrolytes/Nutrition: Monitor I/Os. 8. HTN: Monitor bid. Continue Coreg bid Vitals:   07/19/17 1324 07/20/17 0446  BP: 118/85 122/89  Pulse: 62 (!) 58  Resp: 18 18  Temp: 98.8 F (37.1 C) 97.8 F (36.6 C)  SpO2: 100% 100%    Controlled 5/6 9. Headaches: Report constant piercing/stabbing pain.   Controlled after increase patient to BID topamax.  Will cont and consider d/c at transitional care visit 10.  Constipation improved on Senna 11.  Hx of visual field cut after prior CVA 3 yr ago, restricted driving per Optho, re eval as outpt 12.  Anemia- Hgb 10.6 on 5/3, stable   Hemoccult remains pending- pt states she will have BM to send today  Iron studies within normal limits on 5/3  LOS (Days) 7 A FACE TO FACE EVALUATION WAS PERFORMED  Erick Colace 07/20/2017, 7:23 AM

## 2017-07-20 NOTE — Plan of Care (Signed)
  Problem: Consults Goal: RH STROKE PATIENT EDUCATION Description See Patient Education module for education specifics  Outcome: Progressing   Problem: RH SAFETY Goal: RH STG ADHERE TO SAFETY PRECAUTIONS W/ASSISTANCE/DEVICE Description STG Adhere to Safety Precautions With min Assistance/Device.  Outcome: Progressing   Problem: RH PAIN MANAGEMENT Goal: RH STG PAIN MANAGED AT OR BELOW PT'S PAIN GOAL Description Pain less than < 3  Outcome: Progressing   Problem: RH KNOWLEDGE DEFICIT Goal: RH STG INCREASE KNOWLEDGE OF HYPERTENSION Description Min assist  Outcome: Progressing

## 2017-07-20 NOTE — Consult Note (Signed)
Neuropsychological Consultation   Patient:   Lindsey [name]   DOB:   1970/06/03  MR Number:  [id number]  Location:  [facility] [id number] [place] [address] Dept: [phone] Loc: [phone]           Date of Service:   07/20/2017  Start Time:   10 AM End Time:   11 AM  Provider/Observer:  Lindsey [name], Psy.D.       Clinical Neuropsychologist       Billing Code/Service: [phone] 4 Units  Chief Complaint:    Lindsey [name] is a 47 year old female with history of CAD s/p stenting, history of apical mural thrombus with CVA 2016.  Patient has reports of some residual STM deficits and right field cut.  The patient was medication non-compliance.  Patient was admitted on 07/08/2017 with sudden onset of left facial drop and LUE weakness.  R-MCA / right lateral frontal and insula early/subacute infarct without hemorrhage.  The patient reports that she had been frustrated with medications and had not been taking them.  She also had been dealing with depressive symptoms and motivation overall was poor.  The above chief complaint remains appropriate and active.  The patient reports that her mood has improved somewhat, but that depression is still an issues.  Reason for Service:  Lindsey [name] was referred for neuropsychological consultation due to coping and adjustment issues.  Below is the HPI for the current admission.  HPI: Lindsey [name] is a 47 year old female with history of CAD s/p stenting, history of apical mural thrombus with CVA 2016 --residual STM deficits and right field cut, medication non-compliance,  GERD, HTN who was admitted on 07/08/17 with sudden onset of left facial droop and LUE weakness. History taken from chart review and patient. CT head reviewed, unremarkable for acute intracranial process. Cerebral angio revealing occluded superior division mid 1/3 division of R-MCA with extensive  collaterals and spontaneous recanalization therefore no endovascular treatment recommended. MRI brain done revealing right lateral frontal and insula early/subacute infarct without hemorrhage.    2D echo showed fixed, medium apical thrombus with mild LVH, EF 55-60% with grade one DD.  Current Status:  Patient reports that memory continues to be improved and almost to baseline.  Patient reports that depressive symptoms have improved.  She easily remembered our last visit and reports that she has been applying the coping strategies.    Behavioral Observation: Lindsey [name]  presents as a 47 y.o.-year-old Right African American Female who appeared her stated age. her dress was Appropriate and she was Well Groomed and her manners were Appropriate to the situation.  her participation was indicative of Appropriate and Drowsy behaviors.  There were any physical disabilities noted.  she displayed an appropriate level of cooperation and motivation.     Interactions:    Active Appropriate and Attentive  Attention:   normal and attention span appeared consistent with expected for age  Memory:   normal; remote memory intact, recent memory significantly improved  Visuo-spatial:  not examined  Speech (Volume):  low  Speech:   normal; slurred  Thought Process:  Coherent and Relevant  Though Content:  WNL; not suicidal and not homicidal  Orientation:   person, place, time/date and situation  Judgment:   Fair  Planning:   Fair  Affect:    Appropriate and Depressed  Mood:    Depressed and Dysphoric  Insight:  Fair  Intelligence:   normal  Medical History:   Past Medical History:  Diagnosis Date  . Anemia   . Antithrombin III deficiency (HCC)    ?pt not sure if true diagnosis  . Anxiety   . Apical mural thrombus   . Blood transfusion   . Cholecystitis 07/2016  . Coronary artery disease    a. apical LAD infarction '00. b. NSTEMI s/p BMS to prox LAD '09. c. Cath 01/2015: stable LAD  stent, otherwise minimal nonobstructive CAD.  Marland Kitchen GERD (gastroesophageal reflux disease)   . Hyperlipidemia   . Hypertension   . Myocardial infarction (HCC)   . Noncompliance with medication regimen    a. h/o noncompliance with med regimen (previous running out of Coumadin).  . Stroke (HCC)    assoc with short term memory loss and right peripheral vision loss; age 47   . TIA (transient ischemic attack) 2010  . Tobacco abuse          Psychiatric History:  While patient has no prior reports of depression and treatment, she does reprot that she had been depressed with decreased motivation and effort prior to 2019 stroke.  Family Med/Psych History:  Family History  Problem Relation Age of Onset  . Heart disease Brother        arrhythmia; died  . Breast cancer Maternal Aunt     Risk of Suicide/Violence: low Patient denies current SI or HI.  Impression/DX:  Lindsey [name] is a 47 year old female with history of CAD s/p stenting, history of apical mural thrombus with CVA 2016.  Patient has reports of some residual STM deficits and right field cut.  The patient was medication non-compliance.  Patient was admitted on 07/08/2017 with sudden onset of left facial drop and LUE weakness.  R-MCA / right lateral frontal and insula early/subacute infarct without hemorrhage.  The patient reports that she had been frustrated with medications and had not been taking them.  She also had been dealing with depressive symptoms and motivation overall was poor.  Patient reports that her memory is close to where it was, although mother reports that memory is still and issues.  Patient reports that she had been "depressed" before recent stroke.  She reports that depression may have played a roll in not taking her medications.  Patient reports that depressive symptoms have been worse since stroke.  Worked on issues of coping and depression today.  Worked on coping strategies and patient receptive.     Disposition/Plan:  Will monitor for any worsening of depressive symptoms.  Will see patient again next week.  Diagnosis:    Acute ischemic right MCA stroke [facility]) - Plan: Ambulatory referral to Physical Medicine Rehab         Electronically Signed   _______________________ Lindsey [name], Psy.D.

## 2017-07-20 NOTE — Progress Notes (Signed)
Speech Language Pathology Discharge Summary  Patient Details  Name: Lindsey [name] MRN: [id number] Date of Birth: [birth year]  Today's Date: 07/20/2017 SLP Individual Time: 1430-1515 SLP Individual Time Calculation (min): 45 min   Skilled Therapeutic Interventions:   Skilled ST services focused on swallow and cognitive skills. SLP facilitated skilled observation of regular textured foods and thin liquids with no overt s/s aspiration and Mod I use of swallow precautions. SLP facilitated semi-complex problem solving , error awareness and recall utilizing medication management. Pt required initial supervision A fading to Mod I with semi-complex problem solving and error awareness/correction and Mod I for recall utilizing visual aid. Pt expressed her understanding of the importance of taking medication to prevent another stroke from occurring and agreed to have family assist with medication management until pt is comfortable. Pt was left in room with call bell within reach.     Patient has met 5 of 5 long term goals.  Patient to discharge at overall Modified Independent level.  Reasons goals not met:     Clinical Impression/Discharge Summary:   Pt made great progress meeting 5 out 5 goals, discharging at Mod I level. Pt occasional required supervision A cues for semi-complex problem solving and error awareness/correction, however is functional for discharge and agreed to family assistance with higher level cognitive skills. Pt has been education on the importance of medication management and pt agrees to compliance of medication regiment. Pt demonstrated PO consumption of regular textured foods and thin liquid at Mod I. Pt benefited from skilled ST services in order to maximize functional independence and reduce burden of care requiring intermittent supervision for medication management and no further skilled St services are needed upon discharge.  Care Partner:  Caregiver Able to Provide Assistance:  Yes  Type of Caregiver Assistance: Cognitive  Recommendation:  None;24 hour supervision/assistance(for semi-complex problem solving - medication management)      Equipment:     Reasons for discharge: Discharged from hospital   Patient/Family Agrees with Progress Made and Goals Achieved: Yes   Function:  Eating Eating   Modified Consistency Diet: No Eating Assist Level: No help, No cues           Cognition Comprehension Comprehension assist level: Understands complex 90% of the time/cues 10% of the time  Expression   Expression assist level: Expresses complex 90% of the time/cues < 10% of the time  Social Interaction Social Interaction assist level: Interacts appropriately with others with medication or extra time (anti-anxiety, antidepressant).  Problem Solving Problem solving assist level: Solves complex 90% of the time/cues < 10% of the time;Solves complex problems: Recognizes & self-corrects  Memory Memory assist level: More than reasonable amount of time   [place] 07/20/2017, 3:33 PM

## 2017-07-20 NOTE — Progress Notes (Signed)
Physical Therapy Session Note  Patient Details  Name: NAA CLAPSADDLE MRN: [id number] Date of Birth: September 17, 1970  Today's Date: 07/20/2017 PT Individual Time: 1600-1630 PT Individual Time Calculation (min): 30 min   Short Term Goals: Week 1:  PT Short Term Goal 1 (Week 1): STG = LTG due to short ELOS.  Skilled Therapeutic Interventions/Progress Updates:    Pt supine in bed upon PT arrival, agreeable to therapy tx and denies pain. Pt transferred to sitting mod I and ambulated from room>gym x 200 ft Mod I. Pt worked on dynamic standing balance on rocker board without UE support to work on lateral weightshifts, anterior/posterior weightshifts and with perturbations. Pt worked on dynamic standing balance while standing on airex and hitting ball back and forth with 3lb dowel, x 2 trials. Pt ambulated back to room Mod I >150 ft and left supine in bed with needs in reach .  Therapy Documentation Precautions:  Precautions Precautions: Fall Restrictions Weight Bearing Restrictions: No  See Function Navigator for Current Functional Status.   Therapy/Group: Individual Therapy  Cresenciano Genre, PT, DPT 07/20/2017, 4:12 PM

## 2017-07-20 NOTE — Progress Notes (Signed)
Physical Therapy Session Note  Patient Details  Name: Lindsey [name] MRN: [id number] Date of Birth: 06-Apr-1970  Today's Date: 07/20/2017 PT Individual Time: 1051-1201 PT Individual Time Calculation (min): 70 min   Short Term Goals: Week 1:  PT Short Term Goal 1 (Week 1): STG = LTG due to short ELOS.  Skilled Therapeutic Interventions/Progress Updates:  Pt received in bed & agreeable to tx. No c/o pain reported. Session focused on grad day activities with pt completing all at mod I level without AD (gait over even/uneven surfaces, negotiating stairs with L rail or without rails, transfers from various surfaces such as bed, rocking recliner, and car transfer) - please see function tab for further details. Educated pt on recommendations not to smoke, drink, and to take her prescription medications as recommended, and exercise to prevent another stroke. Educated pt on recommendation of having someone with her when ambulating outside and walking over even sidewalk. Pt completed Berg Balance Test & scored 55/56; educated pt on interpretation of score & current fall risk. Patient demonstrates increased fall risk as noted by score of 55/56 on Berg Balance Scale.  (<36= high risk for falls, close to 100%; 37-45 significant >80%; 46-51 moderate >50%; 52-55 lower >25%). Pt completed TUG, scoring 12.29 seconds over average of 3 trials, and cognitive TUG (counting backwards by 3's with max cuing) with score of 18.73 seconds (average of 3 trials). Pt completed floor transfer with Mod I - reviewed scenarios in which she should call EMS for help. Pt utilized nu-step up to level 7 x 12 minutes with all four extremities with task focusing on endurance training. At end of session pt left sitting on EOB set up with lunch tray - pt made mod I in room by OT earlier today.    Therapy Documentation Precautions:  Precautions Precautions: None Restrictions Weight Bearing Restrictions: No  Balance: Balance Balance  Assessed: Yes Standardized Balance Assessment Standardized Balance Assessment: Berg Balance Test;Timed Up and Go Test Berg Balance Test Sit to Stand: Able to stand without using hands and stabilize independently Standing Unsupported: Able to stand safely 2 minutes Sitting with Back Unsupported but Feet Supported on Floor or Stool: Able to sit safely and securely 2 minutes Stand to Sit: Sits safely with minimal use of hands Transfers: Able to transfer safely, minor use of hands Standing Unsupported with Eyes Closed: Able to stand 10 seconds safely Standing Ubsupported with Feet Together: Able to place feet together independently and stand 1 minute safely From Standing, Reach Forward with Outstretched Arm: Can reach confidently >25 cm (10") From Standing Position, Pick up Object from Floor: Able to pick up shoe safely and easily From Standing Position, Turn to Look Behind Over each Shoulder: Looks behind from both sides and weight shifts well Turn 360 Degrees: Able to turn 360 degrees safely in 4 seconds or less Standing Unsupported, Alternately Place Feet on Step/Stool: Able to stand independently and safely and complete 8 steps in 20 seconds Standing Unsupported, One Foot in Front: Able to place foot tandem independently and hold 30 seconds Standing on One Leg: Able to lift leg independently and hold 5-10 seconds Total Score: 55 Timed Up and Go Test TUG: Normal TUG;Cognitive TUG Normal TUG (seconds): 12.29(average of 3 trials) Cognitive TUG (seconds): 18.73(average of 3 trials; counting backwards by 3's)   See Function Navigator for Current Functional Status.   Therapy/Group: Individual Therapy  Sandi Mariscal 07/20/2017, 12:08 PM

## 2017-07-20 NOTE — Progress Notes (Signed)
Physical Therapy Discharge Summary  Patient Details  Name: Lindsey [name] MRN: [id number] Date of Birth: 1970/08/26  Today's Date: 07/20/2017   Patient has met 10 of 10 long term goals due to improved activity tolerance, improved balance, improved postural control, increased strength, improved awareness and improved coordination.  Patient to discharge at an ambulatory level Modified Independent without AD.   No family has been present for caregiver education.  Reasons goals not met: n/a  Recommendation:  Patient will benefit from ongoing skilled PT services in outpatient setting to continue to advance safe functional mobility, address ongoing impairments in decreased generalized strength & endurance, decreased dynamic balance, and minimize fall risk.  Equipment: No equipment provided  Reasons for discharge: treatment goals met  Patient/family agrees with progress made and goals achieved: Yes   PT Discharge Precautions/Restrictions Precautions Precautions: None Restrictions Weight Bearing Restrictions: No  Vision/Perception  Pt reports she wears glasses at all times (vision is slightly blurry without them). Pt reports impaired peripheral vision 2/2 original stroke but notes no change from baseline following this stroke.   Cognition Overall Cognitive Status: Within Functional Limits for tasks assessed Arousal/Alertness: Awake/alert Orientation Level: Oriented X4 Memory: Impaired Memory Impairment: Decreased recall of new information  Sensation Sensation Light Touch: (denies numbness/tingling ) Proprioception: Appears Intact(BLE) Coordination Gross Motor Movements are Fluid and Coordinated: Yes Coordination and Movement Description: Mild L hemiplegia  Motor  Motor Motor: Hemiplegia Motor - Skilled Clinical Observations: L hemi (mild), generalized weakness   Mobility Bed Mobility Bed Mobility: Rolling Right;Rolling Left;Supine to Sit;Sit to Supine Rolling Right: 7:  Independent Rolling Left: 7: Independent Supine to Sit: 7: Independent Sit to Supine: 7: Independent Transfers Transfers: Yes Sit to Stand: 6: Modified independent (Device/Increase time) Stand to Sit: 6: Modified independent (Device/Increase time)   Locomotion  Ambulation Ambulation: Yes Ambulation/Gait Assistance: 6: Modified independent (Device/Increase time) Ambulation Distance (Feet): 150 Feet Assistive device: None Gait Gait: Yes Gait Pattern: (decreased gait speed, decreased stride length) Stairs / Additional Locomotion Stairs: Yes Stairs Assistance: 6: Modified independent (Device/Increase time) Stair Management Technique: No rails;One rail Left(4 steps without rails, 12 steps with L rail) Number of Stairs: 16 Height of Stairs: 6(inches) Ramp: 6: Modified independent (Device)(ambulatory without AD) Curb: 6: Modified independent (Device/increase time) Wheelchair Mobility Wheelchair Mobility: No   Trunk/Postural Assessment  Cervical Assessment Cervical Assessment: Within Functional Limits Thoracic Assessment Thoracic Assessment: Within Functional Limits Lumbar Assessment Lumbar Assessment: Within Functional Limits   Balance Balance Balance Assessed: Yes Standardized Balance Assessment Standardized Balance Assessment: Berg Balance Test;Timed Up and Go Test Berg Balance Test Sit to Stand: Able to stand without using hands and stabilize independently Standing Unsupported: Able to stand safely 2 minutes Sitting with Back Unsupported but Feet Supported on Floor or Stool: Able to sit safely and securely 2 minutes Stand to Sit: Sits safely with minimal use of hands Transfers: Able to transfer safely, minor use of hands Standing Unsupported with Eyes Closed: Able to stand 10 seconds safely Standing Ubsupported with Feet Together: Able to place feet together independently and stand 1 minute safely From Standing, Reach Forward with Outstretched Arm: Can reach confidently  >25 cm (10") From Standing Position, Pick up Object from Floor: Able to pick up shoe safely and easily From Standing Position, Turn to Look Behind Over each Shoulder: Looks behind from both sides and weight shifts well Turn 360 Degrees: Able to turn 360 degrees safely in 4 seconds or less Standing Unsupported, Alternately Place Feet on Step/Stool: Able to stand  independently and safely and complete 8 steps in 20 seconds Standing Unsupported, One Foot in Front: Able to place foot tandem independently and hold 30 seconds Standing on One Leg: Able to lift leg independently and hold 5-10 seconds Total Score: 55 Timed Up and Go Test TUG: Normal TUG;Cognitive TUG Normal TUG (seconds): 12.29(average of 3 trials) Cognitive TUG (seconds): 18.73(average of 3 trials; counting backwards by 3's)  Extremity Assessment  All extremities WFL.  See Function Navigator for Current Functional Status.  Waunita Schooner, PT, DPT 07/20/2017, 5:07 PM  Netta Corrigan, PT, DPT

## 2017-07-21 MED ORDER — PANTOPRAZOLE SODIUM 40 MG PO TBEC: 40.0000 mg | DELAYED_RELEASE_TABLET | Freq: Every day | ORAL | 0 refills | Status: DC

## 2017-07-21 MED ORDER — RIVAROXABAN 20 MG PO TABS: 20.0000 mg | ORAL_TABLET | Freq: Every day | ORAL | 0 refills | Status: DC

## 2017-07-21 MED ORDER — CARVEDILOL 25 MG PO TABS: 25.0000 mg | ORAL_TABLET | Freq: Two times a day (BID) | ORAL | 0 refills | Status: DC

## 2017-07-21 MED ORDER — TOPIRAMATE 25 MG PO TABS: 25.0000 mg | ORAL_TABLET | Freq: Two times a day (BID) | ORAL | 0 refills | Status: DC

## 2017-07-21 MED ORDER — SENNOSIDES-DOCUSATE SODIUM 8.6-50 MG PO TABS: 1.0000 | ORAL_TABLET | Freq: Two times a day (BID) | ORAL | 0 refills | Status: DC

## 2017-07-21 MED ORDER — CLOPIDOGREL BISULFATE 75 MG PO TABS: 75.0000 mg | ORAL_TABLET | Freq: Every day | ORAL | 0 refills | Status: DC

## 2017-07-21 MED ORDER — ATORVASTATIN CALCIUM 40 MG PO TABS: 40.0000 mg | ORAL_TABLET | Freq: Every day | ORAL | 0 refills | Status: DC

## 2017-07-21 MED ORDER — VITAMIN B-12 1000 MCG PO TABS: 1000.0000 ug | ORAL_TABLET | Freq: Every day | ORAL | 0 refills | Status: DC

## 2017-07-21 NOTE — Progress Notes (Signed)
Social Work Discharge Note  The overall goal for the admission was met for:   Discharge location: Yes - home  Length of Stay: Yes - 8 days  Discharge activity level: Yes - modified independent  Home/community participation: Yes  Services provided included: MD, RD, PT, OT, SLP, RN, TR, Pharmacy, Neuropsych and SW  Financial Services: Medicaid  Follow-up services arranged: Outpatient: PT/OT/ST at [facility], DME: tub seat with back from [place] and Patient/Family has no preference for HH/DME agencies  Comments (or additional information): Pt to return to her home where she lives with her 27 y/o and 79 y/o dtrs.  Her mother and friends will assist her with transportation.  CSW gave pt community resource guide and contact information for Dr. Sima Matas.  Pt told CSW that she plans to take her medications as prescribed, as she does not want this to happen again.  Patient/Family verbalized understanding of follow-up arrangements: Yes  Individual responsible for coordination of the follow-up plan: pt and her mother  Confirmed correct DME delivered: Trey Sailors 07/21/2017    Ilaria Much, Silvestre Mesi

## 2017-07-21 NOTE — Discharge Instructions (Signed)
Inpatient Rehab Discharge Instructions  [facility] Discharge date and time:  07/21/17  Activities/Precautions/ Functional Status: Activity: No lifting, driving, or strenuous exercise till cleared by MD.  Diet: Cardiac diet  Wound Care: N/A  Functional status:  ___ No restrictions     ___ Walk up steps independently ___ 24/7 supervision/assistance   ___ Walk up steps with assistance _X__ Intermittent supervision/assistance  ___ Bathe/dress independently ___ Walk with walker     ___ Bathe/dress with assistance ___ Walk Independently    ___ Shower independently ___ Walk with assistance    ___ Shower with assistance _X__ No alcohol     ___ Return to work/school ________    COMMUNITY REFERRALS UPON DISCHARGE:   Outpatient: PT     OT     ST  Agency:  [facility]                             [address]                            [place], [place]  [address]             Phone:  [phone]  Appointment Date/Time:  Wednesday, May 8th at 11 AM for Physical Therapy (arrive at 10:30 AM)                                                      Thursday, May 9th at 2:45 PM for Occupational Therapy                                                      Monday, June 10th at 8:45 AM for Speech Therapy (they will try to move this to an earlier date.) Medical Equipment/Items Ordered:  Shower seat with back  Agency/Supplier:  [facility]       Phone:  [phone]   GENERAL COMMUNITY RESOURCES FOR PATIENT/FAMILY: Support Groups:  [facility] Stroke Support Group                              Meets the 2nd Thursday of each month from 3 - 4 PM (except June, July, and August)                              In the dayroom of [facility] on [place] at [facility]                              For more information, please call [phone] or email [email or web address] Mental Health:      Dr. Arley Phenix,  Neuropsychologist   [phone]                              Resource Guide Boneta Lucks gave you.  Special Instructions: 1. 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 Recommend follow up with ophthalmology (Dr. Karleen Hampshire- neuroophthalmologic)  for re-evaluation.    STROKE/TIA DISCHARGE INSTRUCTIONS SMOKING Cigarette smoking nearly doubles your risk of having a stroke & is the single most alterable risk factor  If you smoke or have smoked in the last 12 months, you are advised to quit smoking for your health.  Most of the excess cardiovascular risk related to smoking disappears within a year of stopping.  Ask you doctor about anti-smoking medications  [place] Quit Line: 1-800-QUIT NOW  Free Smoking Cessation Classes [phone]  CHOLESTEROL Know your levels; limit fat & cholesterol in your diet  Lipid Panel     Component Value Date/Time   CHOL 147 07/09/2017 0527   TRIG 125 07/09/2017 0527   HDL 37 (L) 07/09/2017 0527   CHOLHDL 4.0 07/09/2017 0527   VLDL 25 07/09/2017 0527   LDLCALC 85 07/09/2017 0527      Many patients benefit from treatment even if their cholesterol is at goal.  Goal: Total Cholesterol (CHOL) less than 160  Goal:  Triglycerides (TRIG) less than 150  Goal:  HDL greater than 40  Goal:  LDL (LDLCALC) less than 100   BLOOD PRESSURE American Stroke Association blood pressure target is less that 120/80 mm/Hg  Your discharge blood pressure is:  BP: 124/75  Monitor your blood pressure  Limit your salt and alcohol intake  Many individuals will require more than one medication for high blood pressure  DIABETES (A1c is a blood sugar average for last 3 months) Goal HGBA1c is under 7% (HBGA1c is blood sugar average for last 3 months)  Diabetes: No known diagnosis of diabetes    Lab Results  Component Value Date   HGBA1C 4.9 07/09/2017     Your HGBA1c can be lowered with medications, healthy diet, and exercise.  Check your blood sugar as  directed by your physician  Call your physician if you experience unexplained or low blood sugars.  PHYSICAL ACTIVITY/REHABILITATION Goal is 30 minutes at least 4 days per week  Activity: No driving, Therapies: see above Return to work: N/A  Activity decreases your risk of heart attack and stroke and makes your heart stronger.  It helps control your weight and blood pressure; helps you relax and can improve your mood.  Participate in a regular exercise program.  Talk with your doctor about the best form of exercise for you (dancing, walking, swimming, cycling).  DIET/WEIGHT Goal is to maintain a healthy weight  Your discharge diet is:  Diet Order           Diet Heart Room service appropriate? Yes; Fluid consistency: Thin liquids Diet effective now        Your height is:    Your current weight is: Weight: 80.5 kg (177 lb 7.5 oz) Your Body Mass Index (BMI) is:  BMI (Calculated): 28.66  Following the type of diet specifically designed for you will help prevent another stroke.  Your goal weight is: 155 lbs  Your goal Body Mass Index (BMI) is 19-24.  Healthy food habits can help reduce 3 risk factors for stroke:  High cholesterol, hypertension, and excess weight.  RESOURCES Stroke/Support Group:  Call [phone]   STROKE EDUCATION PROVIDED/REVIEWED AND GIVEN TO PATIENT Stroke warning signs and symptoms How to activate emergency medical system (call 911). Medications prescribed at discharge. Need for follow-up after discharge. Personal risk factors for stroke. Pneumonia vaccine given:  Flu vaccine given:  My questions have been answered,  the writing is legible, and I understand these instructions.  I will adhere to these goals & educational materials that have been provided to me after my discharge from the hospital.     My questions have been answered and I understand these instructions. I will adhere to these goals and the provided educational materials after my discharge from  the hospital.  Patient/Caregiver Signature _______________________________ Date __________  Clinician Signature _______________________________________ Date __________  Please bring this form and your medication list with you to all your follow-up doctor's appointments.    ------------------ Information on my medicine - XARELTO (rivaroxaban)  This medication education was reviewed with me or my healthcare representative as part of my discharge preparation.    WHY WAS XARELTO PRESCRIBED FOR YOU? Xarelto was prescribed to treat blood clots that may have been found in the veins of your legs (deep vein thrombosis) or in your lungs (pulmonary embolism) and to reduce the risk of them occurring again.  What do you need to know about Xarelto? The dose is one 20 mg tablet taken ONCE A DAY with your evening meal.  DO NOT stop taking Xarelto without talking to the health care provider who prescribed the medication.  Refill your prescription for 20 mg tablets before you run out.  After discharge, you should have regular check-up appointments with your healthcare provider that is prescribing your Xarelto.  In the future your dose may need to be changed if your kidney function changes by a significant amount.  What do you do if you miss a dose? If you are taking Xarelto TWICE DAILY and you miss a dose, take it as soon as you remember. You may take two 15 mg tablets (total 30 mg) at the same time then resume your regularly scheduled 15 mg twice daily the next day.  If you are taking Xarelto ONCE DAILY and you miss a dose, take it as soon as you remember on the same day then continue your regularly scheduled once daily regimen the next day. Do not take two doses of Xarelto at the same time.   Important Safety Information Xarelto is a blood thinner medicine that can cause bleeding. You should call your healthcare provider right away if you experience any of the following: ? Bleeding from an  injury or your nose that does not stop. ? Unusual colored urine (red or dark brown) or unusual colored stools (red or black). ? Unusual bruising for unknown reasons. ? A serious fall or if you hit your head (even if there is no bleeding).  Some medicines may interact with Xarelto and might increase your risk of bleeding while on Xarelto. To help avoid this, consult your healthcare provider or pharmacist prior to using any new prescription or non-prescription medications, including herbals, vitamins, non-steroidal anti-inflammatory drugs (NSAIDs) and supplements.  This website has more information on Xarelto: VisitDestination.com.br.

## 2017-07-21 NOTE — Progress Notes (Addendum)
Subjective/Complaints:  No issues overnite, Mod I in room without issues.  Pt states she is comfortable using pill organizer  ROS-  Denies CP, SOB, N/V/D  Objective: Vital Signs: Blood pressure 124/75, pulse 60, temperature 97.9 F (36.6 C), temperature source Oral, resp. rate 14, weight 80.5 kg (177 lb 7.5 oz), SpO2 100 %. No results found. No results found for this or any previous visit (from the past 72 hour(s)).   Constitutional: No distress . Vital signs reviewed. HENT: Normocephalic.  Atraumatic. Eyes: EOMI. No discharge. Cardiovascular: RRR. No JVD. Respiratory: CTA Bilaterally. Normal effort. GI: BS +. Non-distended. Musc: No edema or tenderness in extremities. Skin:   Intact. Warm and dry. Neuro: Alert/Oriented Motor: 4+/5 Left Del, Bi, Tri , grip, 4+/5 L HF, KE ADF (stable)  Assessment/Plan: 1. Functional deficits secondary to Right M2 sup branch CVA with frontal infarct Stable for D/C today F/u PCP in 3-4 weeks F/u PM&R 2 weeks See D/C summary See D/C instructions FIM: Function - Bathing Position: Shower Body parts bathed by patient: Right arm, Right lower leg, Left arm, Left lower leg, Chest, Abdomen, Front perineal area, Buttocks, Right upper leg, Left upper leg, Back Body parts bathed by helper: Back Assist Level: More than reasonable time  Function- Upper Body Dressing/Undressing What is the patient wearing?: Pull over shirt/dress Pull over shirt/dress - Perfomed by patient: Thread/unthread right sleeve, Thread/unthread left sleeve, Put head through opening, Pull shirt over trunk Assist Level: More than reasonable time Set up : To obtain clothing/put away Function - Lower Body Dressing/Undressing What is the patient wearing?: Pants, Non-skid slipper socks Position: Sitting EOB Pants- Performed by patient: Thread/unthread right pants leg, Thread/unthread left pants leg, Pull pants up/down Non-skid slipper socks- Performed by patient: Don/doff right sock,  Don/doff left sock Assist for footwear: Independent Assist for lower body dressing: More than reasonable time  Function - Toileting Toileting steps completed by patient: Adjust clothing prior to toileting, Performs perineal hygiene, Adjust clothing after toileting Toileting steps completed by helper: Adjust clothing prior to toileting Toileting Assistive Devices: Grab bar or rail Assist level: More than reasonable time  Function Programmer, multimedia transfer assistive device: Grab bar Assist level to toilet: No Help, no cues, assistive device, takes more than a reasonable amount of time Assist level from toilet: No Help, no cues, assistive device, takes more than a reasonable amount of time  Function - Chair/bed transfer Chair/bed transfer method: Ambulatory Chair/bed transfer assist level: No Help, no cues, assistive device, takes more than a reasonable amount of time Chair/bed transfer assistive device: Armrests Chair/bed transfer details: Verbal cues for technique  Function - Locomotion: Wheelchair Will patient use wheelchair at discharge?: No Wheelchair activity did not occur: N/A Max wheelchair distance: 50 ft  Assist Level: Supervision or verbal cues Wheel 50 feet with 2 turns activity did not occur: N/A Assist Level: Supervision or verbal cues Wheel 150 feet activity did not occur: N/A Turns around,maneuvers to table,bed, and toilet,negotiates 3% grade,maneuvers on rugs and over doorsills: No Function - Locomotion: Ambulation Assistive device: No device Max distance: >150 ft  Assist level: No help, No cues, assistive device, takes more than a reasonable amount of time Assist level: No help, No cues, assistive device, takes more than a reasonable amount of time Assist level: No help, No cues, assistive device, takes more than a reasonable amount of time Walk 150 feet activity did not occur: Safety/medical concerns Assist level: No help, No cues, assistive device, takes  more than  a reasonable amount of time Assist level: No help, No cues, assistive device, takes more than a reasonable amount of time  Function - Comprehension Comprehension: Auditory Comprehension assist level: Understands complex 90% of the time/cues 10% of the time  Function - Expression Expression: Verbal Expression assist level: Expresses complex 90% of the time/cues < 10% of the time  Function - Social Interaction Social Interaction assist level: Interacts appropriately with others with medication or extra time (anti-anxiety, antidepressant).  Function - Problem Solving Problem solving assist level: Solves complex 90% of the time/cues < 10% of the time, Solves complex problems: Recognizes & self-corrects  Function - Memory Memory assist level: More than reasonable amount of time Patient normally able to recall (first 3 days only): Current season, Location of own room, Staff names and faces, That he or she is in a hospital  Medical Problem List and Plan: 1.  Deficits with mobility, dysarthria, self-care secondary to right lateral frontal and insula early/subacute infarct MCA distribution   Cont CIR, plan D/C today   2.  Apical mural thrombus/DVT prophylaxis/Anticoagulation: Pharmaceutical: Xarelto  Per Neuro also on clopidigrel 3. Pain Management: tylenol pron 4. Mood: LCSW to follow for evaluation and support.  5. Neuropsych: This patient is capable of making decisions on her own behalf. 6. Skin/Wound Care: routine pressure relief measures.  7. Fluids/Electrolytes/Nutrition: Monitor I/Os. 8. HTN: Monitor bid. Continue Coreg bid Vitals:   07/20/17 2008 07/21/17 0624  BP: (!) 132/93 124/75  Pulse: 73 60  Resp: 16 14  Temp: 99.5 F (37.5 C) 97.9 F (36.6 C)  SpO2: 100% 100%    Controlled 5/6 9. Headaches: Report constant piercing/stabbing pain.   Controlled after increase patient to BID topamax. Will cont and consider d/c at transitional care visit 10.  Constipation  improved on Senna 11.  Hx of visual field cut after prior CVA 3 yr ago, restricted driving per Optho, re eval as outpt 12.  Anemia- Hgb 10.6 on 5/3, stable   Hemoccult remains pending-   Iron studies within normal limits on 5/3  LOS (Days) 8 A FACE TO FACE EVALUATION WAS PERFORMED  Erick Colace 07/21/2017, 7:46 AM

## 2017-07-21 NOTE — Discharge Summary (Signed)
Physician Discharge Summary  Patient ID: Lindsey [name] MRN: [id number] DOB/AGE: 49-Mar-1972 49 y.o.  Admit date: 07/13/2017 Discharge date: 07/21/2017  Discharge Diagnoses:  Principal Problem:   Acute ischemic right MCA stroke [facility]) Active Problems:   Benign essential HTN   Acute blood loss anemia   Vascular headache   Hemiparesis affecting left side as late effect of stroke [facility])   Discharged Condition: stable   Significant Diagnostic Studies: N/A  Labs:  Basic Metabolic Panel: BMP Latest Ref Rng & Units 07/14/2017 07/13/2017 07/12/2017  Glucose 65 - 99 mg/dL 95 409(W) 93  BUN 6 - 20 mg/dL 7 10 8   Creatinine 0.44 - 1.00 mg/dL 1.19 1.47 8.29  Sodium 135 - 145 mmol/L 140 138 139  Potassium 3.5 - 5.1 mmol/L 3.8 3.7 3.7  Chloride 101 - 111 mmol/L 107 109 109  CO2 22 - 32 mmol/L 24 24 24   Calcium 8.9 - 10.3 mg/dL 5.6(O) 1.3(Y) 8.6(V)    CBC: CBC Latest Ref Rng & Units 07/17/2017 07/14/2017 07/13/2017  WBC 4.0 - 10.5 K/uL 5.1 6.7 5.7  Hemoglobin 12.0 - 15.0 g/dL 10.6(L) 10.6(L) 10.4(L)  Hematocrit 36.0 - 46.0 % 33.8(L) 33.4(L) 32.7(L)  Platelets 150 - 400 K/uL 247 256 266    CBG: No results for input(s): GLUCAP in the last 168 hours.  Brief HPI:   Lindsey [name] is a 47 year old right handed female with history of apical mural thrombus with CVA 2016 and medication non-compliance who was admitted on 07/08/17 with sudden onset of left facial weakness and LUE weakness. CTA brain revealed occluded superior of mid 1/3 division for R-MCA with spontaneous recanalization therefore no endovascular treatment recommended. MRI brain revealed right lateral frontal and insula early/subacute infarct without hemorrhage. 2D echo showed fixed, medium apical thrombus with mild LVH and EF 55-60%.  Dr. Pearlean Brownie felt that stroke was embolic due to non-compliance with Xarelto related coagulopathy v/s LV thrombus.  Therapy evaluations revealed functional deficits and CIR recommended for therapy.     Hospital  Course: Lindsey [name] was admitted to rehab 07/13/2017 for inpatient therapies to consist of PT, ST and OT at least three hours five days a week. Past admission physiatrist, therapy team and rehab RN have worked together to provide customized collaborative inpatient rehab. Blood pressures and heart rate has been controlled on coreg bid. Constipation has improved with addition of Senna S bid. CBC showed mild anemia which is stable and no signs of bleeding noted. Po intake has been good and she is continent of bowel and bladder. Topamax was added to help manage HA and was titrated to bid with good results.   She was instructed to follow up with Dr. Karleen Hampshire for evaluation of prior field cut prior to driving again. Dr. Kieth Brightly was consulted for evaluation and has worked with patient on coping strategies for issues with depression as her mother reported symptoms being worse since stroke. She has been motivated and has made good gains during her stay. She has been instructed on importance of medication compliance as well as need to keep follow up appointments. She is modified independent at discharge. She will continue to receive follow up outpatient PT, OT and ST at [facility] after discharge.     Rehab course: During patient's stay in rehab team conference was held to discuss patient's progress, set goals and discuss barriers to discharge. At admission, patient required min assist with ADL tasks and mobility. She required min assist to supervision with cognitive tasks.  She  has had improvement in activity tolerance, balance, postural control as well as ability to compensate for deficits. He/She has had improvement in functional use LUE  and LLE as well as improvement in awareness.  She is able to ambulate 200' at modified independent level without AD.  BERG balance score has improved to 55/56. She requires occasional supervisory cues for semi-complex problem solving and needs assistance with higher level  cognitive tasks. Family education complete and patient was educated on having family assist with higher level tasks.     Discharge disposition: 01-Home or Self Care  Diet: Heart Healthy.   Special Instructions: 1. No driving till cleared by Ophthalmology.   Discharge Instructions    Ambulatory referral to Physical Medicine Rehab   Complete by:  As directed    1-2 weeks transitional care appt     Allergies as of 07/21/2017   No Known Allergies     Medication List    STOP taking these medications   butalbital-acetaminophen-caffeine 50-325-40 MG tablet Commonly known as:  FIORICET, ESGIC   HYDROcodone-acetaminophen 5-325 MG tablet Commonly known as:  NORCO/VICODIN   hydrocortisone 2.5 % rectal cream Commonly known as:  ANUSOL-HC     TAKE these medications   acetaminophen 325 MG tablet Commonly known as:  TYLENOL Take 1-2 tablets (325-650 mg total) by mouth every 4 (four) hours as needed for mild pain.   atorvastatin 40 MG tablet Commonly known as:  LIPITOR Take 1 tablet (40 mg total) by mouth daily.   carvedilol 25 MG tablet Commonly known as:  COREG Take 1 tablet (25 mg total) by mouth 2 (two) times daily with a meal.   clopidogrel 75 MG tablet Commonly known as:  PLAVIX Take 1 tablet (75 mg total) by mouth daily.   nitroGLYCERIN 0.4 MG SL tablet Commonly known as:  NITROSTAT Place 1 tablet (0.4 mg total) under the tongue every 5 (five) minutes as needed for chest pain (up to 3 doses).   pantoprazole 40 MG tablet Commonly known as:  PROTONIX Take 1 tablet (40 mg total) by mouth daily.   rivaroxaban 20 MG Tabs tablet Commonly known as:  XARELTO Take 1 tablet (20 mg total) by mouth daily with supper.   senna-docusate 8.6-50 MG tablet Commonly known as:  Senokot-S Take 1 tablet by mouth 2 (two) times daily. This is over the counter--for constipation   topiramate 25 MG tablet Commonly known as:  TOPAMAX Take 1 tablet (25 mg total) by mouth 2 (two) times  daily.   vitamin B-12 1000 MCG tablet Commonly known as:  CYANOCOBALAMIN Take 1 tablet (1,000 mcg total) by mouth daily.      Follow-up Information    Kirsteins, Victorino Sparrow, MD Follow up.   Specialty:  Physical Medicine and Rehabilitation Why:  office will call you with follow up appointment Contact information: [address] [place] [address] [phone]        Micki Riley, MD. Call in 1 day(s).   Specialties:  Neurology, Radiology Why:  for follow up appointment Contact information: [address] [place] [address] [phone]        Medicine, [facility]. Go on 07/27/2017.   Why:  @ 11 AM with Gar Ponto, FNP (if you want to change PCP, you need to call the # on the back of your Medicaid card & have them change it.  You may want to call around to where you want to go first &  make sure they are accepting new Medicaid patients.) Contact information: [address] [place] [address] [phone]           Signed: Jacquelynn Cree 07/21/2017, 11:42 AM

## 2017-07-21 NOTE — Progress Notes (Signed)
Recreational Therapy Discharge Summary Patient Details  Name: Lindsey [name] MRN: [id number] Date of Birth: Oct 31, 1970 Today's Date: 07/21/2017  Comments on progress toward goals: Pt with short LOS and is expected to discharge home today at Mod I level.  Pt did not participate in an outing due to short LOS however pt was observed to be Mod I for ambulation on various surface types during PT session.  Recommend supervision for community pursuits at discharge for safety.    Reasons for discharge: discharge from hospital Patient/family agrees with progress made and goals achieved: Yes  Dmitry Macomber 07/21/2017, 8:53 AM

## 2017-07-22 ENCOUNTER — Ambulatory Visit: Payer: Medicare Other | Attending: Physical Medicine and Rehabilitation | Admitting: Physical Therapy

## 2017-07-22 ENCOUNTER — Encounter: Payer: Self-pay | Admitting: Physical Therapy

## 2017-07-22 ENCOUNTER — Other Ambulatory Visit: Payer: Self-pay

## 2017-07-22 DIAGNOSIS — R41842 Visuospatial deficit: Secondary | ICD-10-CM | POA: Insufficient documentation

## 2017-07-22 DIAGNOSIS — M6281 Muscle weakness (generalized): Secondary | ICD-10-CM | POA: Diagnosis not present

## 2017-07-22 DIAGNOSIS — R261 Paralytic gait: Secondary | ICD-10-CM | POA: Insufficient documentation

## 2017-07-22 DIAGNOSIS — R471 Dysarthria and anarthria: Secondary | ICD-10-CM | POA: Insufficient documentation

## 2017-07-22 DIAGNOSIS — R41840 Attention and concentration deficit: Secondary | ICD-10-CM | POA: Insufficient documentation

## 2017-07-22 DIAGNOSIS — I69352 Hemiplegia and hemiparesis following cerebral infarction affecting left dominant side: Secondary | ICD-10-CM | POA: Insufficient documentation

## 2017-07-22 DIAGNOSIS — R2681 Unsteadiness on feet: Secondary | ICD-10-CM | POA: Diagnosis not present

## 2017-07-22 DIAGNOSIS — I69315 Cognitive social or emotional deficit following cerebral infarction: Secondary | ICD-10-CM | POA: Insufficient documentation

## 2017-07-22 DIAGNOSIS — R41841 Cognitive communication deficit: Secondary | ICD-10-CM | POA: Insufficient documentation

## 2017-07-22 NOTE — Therapy (Signed)
[facility] [address] [place], [place], [address] Phone: [phone]   Fax:  [phone]  Physical Therapy Evaluation  Patient Details  Name: Lindsey [name] MRN: [id number] Date of Birth: Jan 03, 1971 Referring Provider: Camille Bal, MD   Encounter Date: 07/22/2017  PT End of Session - 07/22/17 2307    Visit Number  1    Number of Visits  7    Authorization Type  Medicaid    PT Start Time  1105    PT Stop Time  1152    PT Time Calculation (min)  47 min    Equipment Utilized During Treatment  Gait belt    Activity Tolerance  Patient tolerated treatment well    Behavior During Therapy  Flat affect;WFL for tasks assessed/performed patient became tearful when discussing outcomes/plan       Past Medical History:  Diagnosis Date  . Anemia   . Antithrombin III deficiency (HCC)    ?pt not sure if true diagnosis  . Anxiety   . Apical mural thrombus   . Blood transfusion   . Cholecystitis 07/2016  . Coronary artery disease    a. apical LAD infarction '00. b. NSTEMI s/p BMS to prox LAD '09. c. Cath 01/2015: stable LAD stent, otherwise minimal nonobstructive CAD.  Marland Kitchen GERD (gastroesophageal reflux disease)   . Hyperlipidemia   . Hypertension   . Myocardial infarction (HCC)   . Noncompliance with medication regimen    a. h/o noncompliance with med regimen (previous running out of Coumadin).  . Stroke (HCC)    assoc with short term memory loss and right peripheral vision loss; age 62   . TIA (transient ischemic attack) 2010  . Tobacco abuse     Past Surgical History:  Procedure Laterality Date  . CARDIAC CATHETERIZATION    . CARDIAC CATHETERIZATION N/A 01/29/2015   Procedure: Left Heart Cath and Coronary Angiography;  Surgeon: Peter M Swaziland, MD;  Location: [facility] INVASIVE CV LAB;  Service: Cardiovascular;  Laterality: N/A;  . CHOLECYSTECTOMY N/A 08/14/2016   Procedure: LAPAROSCOPIC CHOLECYSTECTOMY;  Surgeon: Gaynelle Adu, MD;  Location: [facility] OR;  Service: General;  Laterality: N/A;  . COLONOSCOPY N/A 03/29/2014   Procedure: COLONOSCOPY;  Surgeon: Louis Meckel, MD;  Location: [facility] ENDOSCOPY;  Service: Endoscopy;  Laterality: N/A;  . CORONARY ANGIOPLASTY    . ESOPHAGOGASTRODUODENOSCOPY N/A 06/09/2012   Procedure: ESOPHAGOGASTRODUODENOSCOPY (EGD);  Surgeon: Theda Belfast, MD;  Location: [facility] ENDOSCOPY;  Service: Endoscopy;  Laterality: N/A;  . ESOPHAGOGASTRODUODENOSCOPY N/A 08/02/2013   Procedure: ESOPHAGOGASTRODUODENOSCOPY (EGD);  Surgeon: Meryl Dare, MD;  Location: [facility] ENDOSCOPY;  Service: Endoscopy;  Laterality: N/A;  . IR ANGIO INTRA EXTRACRAN SEL COM CAROTID INNOMINATE UNI R MOD SED  07/08/2017  . LEFT HEART CATHETERIZATION WITH CORONARY ANGIOGRAM N/A 12/24/2011   Procedure: LEFT HEART CATHETERIZATION WITH CORONARY ANGIOGRAM;  Surgeon: Kathleene Hazel, MD;  Location: [facility] CATH LAB;  Service: Cardiovascular;  Laterality: N/A;  . RADIOLOGY WITH ANESTHESIA N/A 07/08/2017   Procedure: IR WITH ANESTHESIA;  Surgeon: Radiologist, Medication, MD;  Location: [facility] OR;  Service: Radiology;  Laterality: N/A;  . TUBAL LIGATION      There were no vitals filed for this visit.   Subjective Assessment - 07/22/17 1110    Subjective  This 47yo female was referred on 07/20/2017 for [facility] PT, OT & ST evaluations for right stroke by Claudette Laws, MD. On 07/08/2017 she was admitted with right MCA infarct. Inpatient rehab 07/13/2017 - 07/21/2017.  Patient is accompained by:  Family member    Pertinent History  R CVA 07/08/17 & hx of ischemic left PCA stroke, HTN, NSTEMI, CAD, MI, TIA,    Limitations  Lifting;Walking;House hold activities;Standing    Patient Stated Goals  walking, stairs, getting in/out shower, no falls,     Currently in Pain?  No/denies         [facility] PT Assessment - 07/22/17 1100      Assessment   Medical Diagnosis  Right MCA CVA    Referring Provider  Camille Bal, MD    Onset  Date/Surgical Date  07/08/17    Hand Dominance  Left    Prior Therapy  inpatient rehab      Precautions   Precautions  Fall    Precaution Comments  no driving      Balance Screen   Has the patient fallen in the past 6 months  No    Has the patient had a decrease in activity level because of a fear of falling?   Yes    Is the patient reluctant to leave their home because of a fear of falling?   Yes      Home Environment   Living Environment  Private residence    Living Arrangements  Children 13yo & 23yo daughters    Type of Home  House    Home Access  Stairs to enter    Entrance Stairs-Number of Steps  1    Entrance Stairs-Rails  None    Home Layout  Two level;1/2 bath on main level    Alternate Level Stairs-Number of Steps  8    Alternate Level Stairs-Rails  Left    Home Equipment  Walker - 2 wheels      Prior Function   Level of Independence  Independent;Independent with gait;Independent with community mobility without device    Vocation  Other (comment) filing for disability    Vocation Requirements  was full time as Conservation officer, nature at [employer], sing, play keyboard,       Posture/Postural Control   Posture/Postural Control  Postural limitations    Postural Limitations  Forward head;Weight shift right      ROM / Strength   AROM / PROM / Strength  AROM;Strength      AROM   Overall AROM   Within functional limits for tasks performed      Strength   Overall Strength  Deficits    Strength Assessment Site  Hip;Knee;Ankle    Right/Left Hip  Left    Left Hip Flexion  4/5    Left Hip Extension  3-/5    Left Hip ABduction  3+/5    Right/Left Knee  Left    Left Knee Flexion  4-/5    Left Knee Extension  4/5    Right/Left Ankle  Left    Left Ankle Dorsiflexion  4/5    Left Ankle Plantar Flexion  3-/5      Ambulation/Gait   Ambulation/Gait  Yes    Ambulation/Gait Assistance  5: Supervision    Ambulation Distance (Feet)  300 Feet    Assistive device   None    Gait Pattern  Step-through pattern;Decreased arm swing - left;Decreased step length - right;Decreased stance time - left;Decreased stride length;Decreased hip/knee flexion - left;Decreased dorsiflexion - left;Decreased weight shift to left;Left hip hike;Left foot flat;Left flexed knee in stance;Antalgic;Poor foot clearance - left    Ambulation Surface  Indoor;Level  Gait velocity  3.01 ft/sec    Stairs  Yes    Stairs Assistance  5: Supervision    Stairs Assistance Details (indicate cue type and reason)  left rail simulating home    Stair Management Technique  One rail Left;Step to pattern;Forwards    Number of Stairs  4      Standardized Balance Assessment   Standardized Balance Assessment  Berg Balance Test;Timed Up and Go Test      Berg Balance Test   Sit to Stand  Able to stand without using hands and stabilize independently    Standing Unsupported  Able to stand safely 2 minutes    Sitting with Back Unsupported but Feet Supported on Floor or Stool  Able to sit safely and securely 2 minutes    Stand to Sit  Sits safely with minimal use of hands    Transfers  Able to transfer safely, minor use of hands    Standing Unsupported with Eyes Closed  Able to stand 10 seconds safely    Standing Ubsupported with Feet Together  Able to place feet together independently and stand 1 minute safely    From Standing, Reach Forward with Outstretched Arm  Can reach confidently >25 cm (10")    From Standing Position, Pick up Object from Floor  Able to pick up shoe safely and easily    From Standing Position, Turn to Look Behind Over each Shoulder  Looks behind one side only/other side shows less weight shift    Turn 360 Degrees  Able to turn 360 degrees safely but slowly    Standing Unsupported, Alternately Place Feet on Step/Stool  Able to stand independently and safely and complete 8 steps in 20 seconds    Standing Unsupported, One Foot in Front  Able to plae foot ahead of the other  independently and hold 30 seconds    Standing on One Leg  Able to lift leg independently and hold 5-10 seconds    Total Score  51      Timed Up and Go Test   Normal TUG (seconds)  11.84    Manual TUG (seconds)  --    Cognitive TUG (seconds)  21.22 79% increase      Functional Gait  Assessment   Gait assessed   Yes    Gait Level Surface  Walks 20 ft in less than 7 sec but greater than 5.5 sec, uses assistive device, slower speed, mild gait deviations, or deviates 6-10 in outside of the 12 in walkway width.    Change in Gait Speed  Makes only minor adjustments to walking speed, or accomplishes a change in speed with significant gait deviations, deviates 10-15 in outside the 12 in walkway width, or changes speed but loses balance but is able to recover and continue walking.    Gait with Horizontal Head Turns  Performs head turns with moderate changes in gait velocity, slows down, deviates 10-15 in outside 12 in walkway width but recovers, can continue to walk.    Gait with Vertical Head Turns  Performs task with moderate change in gait velocity, slows down, deviates 10-15 in outside 12 in walkway width but recovers, can continue to walk.    Gait and Pivot Turn  Turns slowly, requires verbal cueing, or requires several small steps to catch balance following turn and stop    Step Over Obstacle  Is able to step over one shoe box (4.5 in total height) but must slow down and adjust steps  to clear box safely. May require verbal cueing.    Gait with Narrow Base of Support  Ambulates less than 4 steps heel to toe or cannot perform without assistance.    Gait with Eyes Closed  Cannot walk 20 ft without assistance, severe gait deviations or imbalance, deviates greater than 15 in outside 12 in walkway width or will not attempt task.    Ambulating Backwards  Walks 20 ft, slow speed, abnormal gait pattern, evidence for imbalance, deviates 10-15 in outside 12 in walkway width.    Steps  Two feet to a stair, must  use rail.    Total Score  9                Objective measurements completed on examination: See above findings.                PT Short Term Goals - 07/22/17 2309      PT SHORT TERM GOAL #1   Title  Patient demonstrates understanding of initial HEP. (All STGs Target Date 4th visit)    Baseline  Patient is dependent in proper exercises with CVA.     Time  3    Period  Weeks    Status  New      PT SHORT TERM GOAL #2   Title  Patient ambulates 500' outdoors including ramps & curbs without device with supervision.    Baseline  Patient ambulates 300' indoor surfaces with gait deviations and balance issues with scanning & negotiating obstacles.     Time  3    Period  Weeks    Status  New      PT SHORT TERM GOAL #3   Title  Patient is able to ambulate while performing simple cognitive task without balance loss.     Baseline  Patient has balance deficits & loss when ambulating while performing simple cognitive tasks.     Time  3    Period  Weeks    Status  New        PT Long Term Goals - 07/22/17 2316      PT LONG TERM GOAL #1   Title  Patient verbalizes & demonstrates ongoing HEP/ fitness plan. (All LTGs Target Date: 7th visit)    Baseline  Patient is dependent in proper exercises with CVA.    Time  6    Period  Weeks    Status  New      PT LONG TERM GOAL #2   Title  Berg Balance </= 54/56    Baseline  Berg Balance 51/56    Time  6    Period  Weeks    Status  New      PT LONG TERM GOAL #3   Title  Cognitive (simple naming task A-Z) Timed Up-Go <15.5 seconds     Baseline  Cognitive TUG 21.22seconds    Time  6    Period  Weeks    Status  New      PT LONG TERM GOAL #4   Title  Functional Gait Assessment >/= 19/30    Baseline  Functional Gait Assessment 9/30    Time  6    Period  Weeks    Status  New      PT LONG TERM GOAL #5   Title  Patient ambulates 1000' outdoors including grass, ramps, curbs & stairs single rail without device modified  independent.     Baseline  Patient ambulates 300' indoors with  close supervision & gait deviations placing patient at high fall risk.     Time  6    Period  Weeks    Status  New             Plan - 07/22/17 2325    Clinical Impression Statement  This 47yo female had 2nd CVA resulting in hemiparesis of her dominant left upper extremity. Berg Balance 51/56 indicates moderate fall risk. Timed Up & Go cognitive 21.22 seconds which 78% increase from her standard TUG which places her at risk of falls when ambulating & performing cognitive task. Functional Gait Assessment 9/30 indicates high fall risk with gait activities. Patient has limited gait distances tolerated with limited activity tolerance & fatigue. Patient would benefit from skilled PT care.     History and Personal Factors relevant to plan of care:  R CVA 07/08/17 & hx of ischemic left PCA stroke, HTN, NSTEMI, CAD, MI, TIA,    Clinical Presentation  Stable    Clinical Decision Making  Low    Rehab Potential  Good    PT Frequency  1x / week    PT Duration  6 weeks    PT Treatment/Interventions  ADLs/Self Care Home Management;Gait training;Stair training;Functional mobility training;Therapeutic activities;Therapeutic exercise;Balance training;Neuromuscular re-education;Patient/family education;Canalith Repostioning;Vestibular    PT Next Visit Plan  establish initial HEP for LLE strengthening & balance, gait training    Consulted and Agree with Plan of Care  Patient       Patient will benefit from skilled therapeutic intervention in order to improve the following deficits and impairments:  Abnormal gait, Decreased activity tolerance, Decreased balance, Decreased coordination, Decreased endurance, Decreased mobility, Decreased strength  Visit Diagnosis: Hemiplegia and hemiparesis following cerebral infarction affecting left dominant side (HCC)  Paralytic gait  Unsteadiness on feet     Problem List Patient Active Problem List    Diagnosis Date Noted  . Acute blood loss anemia   . Vascular headache   . Hemiparesis affecting left side as late effect of stroke (HCC)   . Tobacco use disorder 07/13/2017  . Noncompliance with medication regimen 07/13/2017  . Acute ischemic right MCA stroke (HCC) 07/13/2017  . Benign essential HTN   . Stroke (cerebrum) (HCC) 07/08/2017  . Middle cerebral artery embolism, right 07/08/2017  . Vitamin B12 deficiency 08/15/2016  . Acute cholecystitis 08/14/2016  . GERD (gastroesophageal reflux disease) 04/24/2015  . Hypertensive urgency 04/23/2015  . Chest pain, unspecified   . Right leg swelling 03/07/2015  . Preventative health care 02/21/2015  . Esophageal reflux   . NSTEMI (non-ST elevated myocardial infarction) (HCC) 01/28/2015  . Nausea without vomiting 12/22/2014  . Dysuria 12/21/2014  . Diarrhea 09/26/2014  . Difficulty swallowing pills 09/26/2014  . Hemorrhoids 06/16/2014  . Elevated AST (SGOT) 03/27/2014  . Lumbar herniated disc 03/27/2014  . TIA (transient ischemic attack) 02/19/2014  . History of ischemic left PCA stroke 02/19/2014  . Palpitations 01/27/2014  . Depression 08/15/2013  . Long term current use of anticoagulant therapy 08/10/2013  . Gastritis and duodenitis 08/03/2013  . Diverticulosis 08/03/2013  . Fatty liver 08/03/2013  . Esophagitis 08/03/2013  . Hiatal hernia 08/03/2013  . Apical mural thrombus 08/02/2013  . GI bleeding 06/04/2012  . Hypokalemia 06/04/2012  . Antithrombin III deficiency (HCC) 06/04/2012  . Paresthesias 06/29/2011  . Hyperlipemia 06/07/2009  . Essential hypertension 05/17/2009  . OBESITY-MORBID (>100') 03/13/2008  . CAD S/P  BMS to proximal LAD 2009-patent 01/29/15 06/28/2007    Aydian Dimmick PT, DPT 07/22/2017,  11:34 PM  [facility] [address] [place], [place], [address] Phone: [phone]   Fax:  [phone]  Name: Lindsey [name] MRN: [id number] Date  of Birth: 03-Dec-1970

## 2017-07-22 NOTE — Progress Notes (Signed)
Late entry. Patient discharged to home at approximately 0935 with discharge instructions given and reviewed by  P. Love , PA. Patient verbalized understanding of discharge instructions. No complaint of pain. Patient excited to go home alert and oriented x 4.  Lindsey [name]

## 2017-07-23 ENCOUNTER — Other Ambulatory Visit: Payer: Self-pay

## 2017-07-23 ENCOUNTER — Encounter: Payer: Self-pay | Admitting: Occupational Therapy

## 2017-07-23 ENCOUNTER — Ambulatory Visit: Payer: Medicare Other | Admitting: Occupational Therapy

## 2017-07-23 ENCOUNTER — Telehealth: Payer: Self-pay | Admitting: Registered Nurse

## 2017-07-23 DIAGNOSIS — I69352 Hemiplegia and hemiparesis following cerebral infarction affecting left dominant side: Secondary | ICD-10-CM | POA: Diagnosis not present

## 2017-07-23 DIAGNOSIS — I69315 Cognitive social or emotional deficit following cerebral infarction: Secondary | ICD-10-CM

## 2017-07-23 DIAGNOSIS — R2681 Unsteadiness on feet: Secondary | ICD-10-CM | POA: Diagnosis not present

## 2017-07-23 DIAGNOSIS — R41840 Attention and concentration deficit: Secondary | ICD-10-CM | POA: Diagnosis not present

## 2017-07-23 DIAGNOSIS — M6281 Muscle weakness (generalized): Secondary | ICD-10-CM | POA: Diagnosis not present

## 2017-07-23 DIAGNOSIS — R261 Paralytic gait: Secondary | ICD-10-CM | POA: Diagnosis not present

## 2017-07-23 DIAGNOSIS — R41842 Visuospatial deficit: Secondary | ICD-10-CM

## 2017-07-23 NOTE — Telephone Encounter (Signed)
Transitional Care call Transitional Care Call Completed, Appointment Confirmed, Address Confirmed, New Patient Packet Mailed Transitional Care Call Questions answered by her mother: Ms. Charm Barges  Patient name: Lindsey [name]  DOB: 11/29/1970 1. Are you/is patient experiencing any problems since coming home? No a. Are there any questions regarding any aspect of care? No 2. Are there any questions regarding medications administration/dosing? No a. Are meds being taken as prescribed? Yes b. "Patient should review meds with caller to confirm"  3. Have there been any falls? No 4. Has Home Health been to the house and/or have they contacted you? Yes, [facility]. a. If not, have you tried to contact them? NA b. Can we help you contact them? NA 5. Are bowels and bladder emptying properly? Yes a. Are there any unexpected incontinence issues? No b. If applicable, is patient following bowel/bladder programs? NA 6. Any fevers, problems with breathing, unexpected pain? No 7. Are there any skin problems or new areas of breakdown? No 8. Has the patient/family member arranged specialty MD follow up (ie cardiology/neurology/renal/surgical/etc.)?  Yes, she will call Dr. Pearlean Brownie office to schedule follow up appointment. a. Can we help arrange? NA 9. Does the patient need any other services or support that we can help arrange? No 10. Are caregivers following through as expected in assisting the patient? Yes 11. Has the patient quit smoking, drinking alcohol, or using drugs as recommended? Ms. Charm Barges states [name] hasn't smoke since hospitalization. Also reports [name] doesn't drink alcohol or use illicit drugs.   Appointment date/time 08/03/17 Arrival time 11:00 for 11:20 appointment with Jacalyn Lefevre ANP. At [address] [employer] [facility] [address]

## 2017-07-23 NOTE — Therapy (Signed)
[facility] [address] [place], [place], [address] Phone: [phone]   Fax:  [phone]  Occupational Therapy Evaluation  Patient Details  Name: MICHAYLA MCNEIL MRN: [id number] Date of Birth: 05/19/1970 Referring Provider: Claudette Laws, MD   Encounter Date: 07/23/2017  OT End of Session - 07/23/17 1704    Visit Number  1    Number of Visits  9    Authorization Type  Medicaid    OT Start Time  1447    OT Stop Time  1533    OT Time Calculation (min)  46 min    Behavior During Therapy  [facility] for tasks assessed/performed       Past Medical History:  Diagnosis Date  . Anemia   . Antithrombin III deficiency (HCC)    ?pt not sure if true diagnosis  . Anxiety   . Apical mural thrombus   . Blood transfusion   . Cholecystitis 07/2016  . Coronary artery disease    a. apical LAD infarction '00. b. NSTEMI s/p BMS to prox LAD '09. c. Cath 01/2015: stable LAD stent, otherwise minimal nonobstructive CAD.  Marland Kitchen GERD (gastroesophageal reflux disease)   . Hyperlipidemia   . Hypertension   . Myocardial infarction (HCC)   . Noncompliance with medication regimen    a. h/o noncompliance with med regimen (previous running out of Coumadin).  . Stroke (HCC)    assoc with short term memory loss and right peripheral vision loss; age 10   . TIA (transient ischemic attack) 2010  . Tobacco abuse     Past Surgical History:  Procedure Laterality Date  . CARDIAC CATHETERIZATION    . CARDIAC CATHETERIZATION N/A 01/29/2015   Procedure: Left Heart Cath and Coronary Angiography;  Surgeon: Peter M Swaziland, MD;  Location: [facility] INVASIVE CV LAB;  Service: Cardiovascular;  Laterality: N/A;  . CHOLECYSTECTOMY N/A 08/14/2016   Procedure: LAPAROSCOPIC CHOLECYSTECTOMY;  Surgeon: Gaynelle Adu, MD;  Location: [facility] OR;  Service: General;  Laterality: N/A;  . COLONOSCOPY N/A 03/29/2014   Procedure: COLONOSCOPY;  Surgeon: Louis Meckel, MD;  Location: [facility]  ENDOSCOPY;  Service: Endoscopy;  Laterality: N/A;  . CORONARY ANGIOPLASTY    . ESOPHAGOGASTRODUODENOSCOPY N/A 06/09/2012   Procedure: ESOPHAGOGASTRODUODENOSCOPY (EGD);  Surgeon: Theda Belfast, MD;  Location: [facility] ENDOSCOPY;  Service: Endoscopy;  Laterality: N/A;  . ESOPHAGOGASTRODUODENOSCOPY N/A 08/02/2013   Procedure: ESOPHAGOGASTRODUODENOSCOPY (EGD);  Surgeon: Meryl Dare, MD;  Location: [facility] ENDOSCOPY;  Service: Endoscopy;  Laterality: N/A;  . IR ANGIO INTRA EXTRACRAN SEL COM CAROTID INNOMINATE UNI R MOD SED  07/08/2017  . LEFT HEART CATHETERIZATION WITH CORONARY ANGIOGRAM N/A 12/24/2011   Procedure: LEFT HEART CATHETERIZATION WITH CORONARY ANGIOGRAM;  Surgeon: Kathleene Hazel, MD;  Location: [facility] CATH LAB;  Service: Cardiovascular;  Laterality: N/A;  . RADIOLOGY WITH ANESTHESIA N/A 07/08/2017   Procedure: IR WITH ANESTHESIA;  Surgeon: Radiologist, Medication, MD;  Location: [facility] OR;  Service: Radiology;  Laterality: N/A;  . TUBAL LIGATION      There were no vitals filed for this visit.  Subjective Assessment - 07/23/17 1456    Subjective   I want to gain the confidence to the things I would normally do.      Currently in Pain?  Yes    Pain Score  3     Pain Location  Head    Pain Orientation  Right    Pain Descriptors / Indicators  Aching    Pain Type  Acute pain  Pain Onset  1 to 4 weeks ago    Pain Frequency  Intermittent    Aggravating Factors   Nothing makes it worse    Pain Relieving Factors  Tylenol        [facility] OT Assessment - 07/23/17 0001      Assessment   Medical Diagnosis  Right MCA CVA    Referring Provider  Claudette Laws, MD    Onset Date/Surgical Date  07/08/17    Hand Dominance  Left    Prior Therapy  Inpatient rehab 4/29-07/21/17      Precautions   Precautions  Fall    Precaution Comments  No Driving      Prior Function   Level of Independence  Independent with basic ADLs;Independent with household mobility without device;Independent with community  mobility without device;Independent with homemaking with ambulation    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, nature    Leisure  dance, sing, play keyboard, cheer mom      ADL   Eating/Feeding  Independent    Grooming  Minimal assistance    Grooming details  Arms fatigue with hair care, daughter puts up her hair    Upper Body Bathing  Independent    Lower Body Bathing  Independent    Upper Body Dressing  Minimal assistance    Upper Body Dressing Details  Difficulty unhooking and removing bra    Lower Body Dressing  Independent    Toilet Transfer  Independent    Toileting - Recruitment consultant -  Geneticist, molecular  Supervision/safety      IADL   Prior Level of Teacher, early years/pre for transportation;Needs to be accompanied on any shopping trip    Prior Level of Function Light Housekeeping  Independent    Light Housekeeping  Performs light daily tasks but cannot maintain acceptable level of cleanliness    Prior Level of Function Meal Prep  Independent    Meal Prep  Does not utilize stove or oven;Able to complete simple cold meal and snack prep    Prior Level of Function Curator  Relies on family or friends for transportation    Medication Management  Is not capable of dispensing or managing own medication    Prior Level of Function Financial Management  Independent    Financial Management  Requires assistance      Written Expression   Dominant Hand  Left    Handwriting  100% legible      Vision - History   Baseline Vision  Wears glasses all the time    Visual History  Other (comment) prior stroke lost peripheral vision    Patient Visual Report  Eye fatigue/eye pain/headache    Additional Comments  IP Team recommended follow up with Dr Karleen Hampshire      Vision Assessment   Eye Alignment  Within Functional Limits    Comment  Needs further assessment       Activity Tolerance   Activity Tolerance  Tolerates 10-20 min activity with multiple rests    Activity Tolerance Comments  Patient reports 5-10 min of activity at any given time.  Patient very recently released from IP rehab      Cognition   Overall Cognitive Status  Impaired/Different from baseline    Area of Impairment  Attention;Safety/judgement;Problem solving    Current Attention Level  Sustained  Safety/Judgement  Decreased awareness of safety    Problem Solving  Slow processing;Decreased initiation      Posture/Postural Control   Posture/Postural Control  Postural limitations    Postural Limitations  Forward head;Weight shift right      Sensation   Light Touch  Appears Intact      Coordination   Gross Motor Movements are Fluid and Coordinated  Yes    Fine Motor Movements are Fluid and Coordinated  No    9 Hole Peg Test  Right;Left    Right 9 Hole Peg Test  28.52    Left 9 Hole Peg Test  29.59      ROM / Strength   AROM / PROM / Strength  Strength      AROM   Overall AROM   Within functional limits for tasks performed      Strength   Overall Strength  Deficits    Overall Strength Comments  4/5 Shoulder, elbow- left      Hand Function   Right Hand Gross Grasp  Functional    Right Hand Grip (lbs)  55    Right Hand Lateral Pinch  19 lbs    Left Hand Gross Grasp  Impaired    Left Hand Grip (lbs)  35    Left Hand Lateral Pinch  11 lbs                      OT Education - 07/23/17 1704    Education provided  Yes    Education Details  results of evaluation    Person(s) Educated  Patient    Methods  Explanation    Comprehension  Verbalized understanding;Need further instruction       OT Short Term Goals - 07/23/17 1729      OT SHORT TERM GOAL #1   Title  Patient will complete a home exercise program designed to improve strength in left hand with min prompting due 6/8    Baseline  No current HEP    Time  3    Period  Weeks    Status  New     Target Date  08/22/17      OT SHORT TERM GOAL #2   Title  Patient will complete a home activities program designed to increase her use of left hand as dominant 50% time with min prompting    Baseline  Patient is not spontaneoulsly using left hand as dominant    Time  3    Period  Weeks    Status  New      OT SHORT TERM GOAL #3   Title  Patient will utilize stove top to effectively to cook one item with supervision    Baseline  Patient is not cooking using stove or oven    Time  3    Period  Weeks        OT Long Term Goals - 07/23/17 1722      OT LONG TERM GOAL #1   Title  Patient will complete a home exercise program for LUE strengthening independently (due 6/23 - see [facility] approval end date)    Baseline  Patient does not currently have a HEP    Time  6    Period  Weeks    Status  New    Target Date  09/06/17      OT LONG TERM GOAL #2   Title  Patient will utilize left hand as dominant 75%  time during ADL/IADL without prompting    Baseline  Patient is only using left hand when encouraged to do so per her report    Time  6    Period  Weeks    Status  New      OT LONG TERM GOAL #3   Title  Patient will utilize a medication management system to help her comply with daily medications with supervision    Baseline  Patient's mother is organizing medication for patient    Time  6    Period  Weeks    Status  New      OT LONG TERM GOAL #4   Title  Patient will demonstrate an 8 lb imprvement in left hand strength    Baseline  35 lb on 07/23/17    Time  6    Period  Weeks    Status  New      OT LONG TERM GOAL #5   Title  Patent will independently complete hair care and doffing bra in reasonable timeframe.      Baseline  Patient's daughter is assisting with hair care, and unfastening bra    Time  6    Period  Weeks    Status  New            Plan - 07/23/17 1718    Clinical Impression Statement  Patient is a 47 year old woman recently discharged from the hospital on  5/7 following acute ischemic R MCA stroke on 4/24.  This is patient's second stroke - she had initial stroke in 2016 which left mostly visual deficits per patient.  Patient currently requires assistance for some of her basic self care skills, and most of her IADL's due to left hemiparesis, decreased strength in left upper extremity distal > proximal, decreased attention, speed of processing, initiaition, decreased activity tolerance, decreased frustration tolerance, mood chnages - easily overwhelmed and hypersensitive.  Patient with prior peripheral visual deficit - and has been encouraged to seek Neuroopthalmology consult.  Patient will benefit from skilled OT intervention to help her regain independence in both ADL/IADL and return to more active role of parent.      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  Has medical history significant for blood clotting disorder history of heart attacks and stroke.      Occupational performance deficits (Please refer to evaluation for details):  ADL's;IADL's;Work;Leisure;Social Participation    Rehab Potential  Good    OT Frequency  2x / week    OT Treatment/Interventions  Self-care/ADL training;Therapeutic exercise;Functional Mobility Training;Balance training;Neuromuscular education;Energy conservation;Therapeutic activities;Coping strategies training;Cognitive remediation/compensation;DME and/or AE instruction;Visual/perceptual remediation/compensation;Patient/family education    Plan  Please complete assessment for cognition- MOCA, Trail Making, Symbols, and assess visual fields - start HEP hand strengthening    Clinical Decision Making  Several treatment options, min-mod task modification necessary    Recommended Other Services  SLP - Hope to move up date for SLP eval    Consulted and Agree with Plan of Care  Patient        Patient will benefit from skilled therapeutic intervention in order to improve the following deficits and impairments:  Decreased endurance, Impaired vision/preception, Decreased coping skills, Impaired perceived functional ability, Decreased activity tolerance, Decreased strength, Decreased balance, Decreased cognition, Decreased coordination, Decreased safety awareness, Impaired UE functional use  Visit Diagnosis: Muscle weakness (generalized) - Plan:  Ot plan of care cert/re-cert  Hemiplegia and hemiparesis following cerebral infarction affecting left dominant side (HCC) - Plan: Ot plan of care cert/re-cert  Attention and concentration deficit - Plan: Ot plan of care cert/re-cert  Cognitive social or emotional deficit following cerebral infarction - Plan: Ot plan of care cert/re-cert  Visuospatial deficit - Plan: Ot plan of care cert/re-cert    Problem List Patient Active Problem List   Diagnosis Date Noted  . Acute blood loss anemia   . Vascular headache   . Hemiparesis affecting left side as late effect of stroke (HCC)   . Tobacco use disorder 07/13/2017  . Noncompliance with medication regimen 07/13/2017  . Acute ischemic right MCA stroke (HCC) 07/13/2017  . Benign essential HTN   . Stroke (cerebrum) (HCC) 07/08/2017  . Middle cerebral artery embolism, right 07/08/2017  . Vitamin B12 deficiency 08/15/2016  . Acute cholecystitis 08/14/2016  . GERD (gastroesophageal reflux disease) 04/24/2015  . Hypertensive urgency 04/23/2015  . Chest pain, unspecified   . Right leg swelling 03/07/2015  . Preventative health care 02/21/2015  . Esophageal reflux   . NSTEMI (non-ST elevated myocardial infarction) (HCC) 01/28/2015  . Nausea without vomiting 12/22/2014  . Dysuria 12/21/2014  . Diarrhea 09/26/2014  . Difficulty swallowing pills 09/26/2014  . Hemorrhoids 06/16/2014  . Elevated AST (SGOT) 03/27/2014  . Lumbar herniated disc 03/27/2014  . TIA (transient ischemic attack)  02/19/2014  . History of ischemic left PCA stroke 02/19/2014  . Palpitations 01/27/2014  . Depression 08/15/2013  . Long term current use of anticoagulant therapy 08/10/2013  . Gastritis and duodenitis 08/03/2013  . Diverticulosis 08/03/2013  . Fatty liver 08/03/2013  . Esophagitis 08/03/2013  . Hiatal hernia 08/03/2013  . Apical mural thrombus 08/02/2013  . GI bleeding 06/04/2012  . Hypokalemia 06/04/2012  . Antithrombin III deficiency (HCC) 06/04/2012  . Paresthesias 06/29/2011  . Hyperlipemia 06/07/2009  . Essential hypertension 05/17/2009  . OBESITY-MORBID (>100') 03/13/2008  . CAD S/P  BMS to proximal LAD 2009-patent 01/29/15 06/28/2007    Collier Salina, OTR/L 07/23/2017, 5:36 PM  [facility] [address] [place], [place], [address] Phone: [phone]   Fax:  [phone]  Name: JENAVEVE FENSTERMAKER MRN: [id number] Date of Birth: Jul 31, 1970

## 2017-07-24 ENCOUNTER — Ambulatory Visit: Payer: Medicare Other | Admitting: Speech Pathology

## 2017-07-24 ENCOUNTER — Encounter: Payer: Self-pay | Admitting: Speech Pathology

## 2017-07-24 DIAGNOSIS — I69352 Hemiplegia and hemiparesis following cerebral infarction affecting left dominant side: Secondary | ICD-10-CM | POA: Diagnosis not present

## 2017-07-24 DIAGNOSIS — R471 Dysarthria and anarthria: Secondary | ICD-10-CM

## 2017-07-24 DIAGNOSIS — I69315 Cognitive social or emotional deficit following cerebral infarction: Secondary | ICD-10-CM | POA: Diagnosis not present

## 2017-07-24 DIAGNOSIS — R41841 Cognitive communication deficit: Secondary | ICD-10-CM

## 2017-07-24 DIAGNOSIS — R41840 Attention and concentration deficit: Secondary | ICD-10-CM | POA: Diagnosis not present

## 2017-07-24 DIAGNOSIS — R261 Paralytic gait: Secondary | ICD-10-CM | POA: Diagnosis not present

## 2017-07-24 DIAGNOSIS — M6281 Muscle weakness (generalized): Secondary | ICD-10-CM | POA: Diagnosis not present

## 2017-07-24 DIAGNOSIS — R2681 Unsteadiness on feet: Secondary | ICD-10-CM | POA: Diagnosis not present

## 2017-07-24 NOTE — Patient Instructions (Signed)
   Cognitive Activities you can do at home:   - Solitaire  - Majong  - Scrabble  - Chess/Checkers  - Crosswords (easy level)  - Juanna Cao  - Card Games  - Board Games  - Connect 4  - Simon  - the Memory Game  - Dominoes  - Backgammon  - Jig Saws  On your computer, tablet or phone: [employer] Game App [employer]  Put a sign on the mirror to turn off the shower!

## 2017-07-28 NOTE — Therapy (Signed)
[facility] [address] [place], [place], [address] Phone: [phone]   Fax:  [phone]  Speech Language Pathology Evaluation  Patient Details  Name: Lindsey [name] MRN: [id number] Date of Birth: 31-May-1970 Referring Provider: Dr. Claudette Laws   Encounter Date: 07/24/2017  End of Session - 07/28/17 1128    Visit Number  1    Number of Visits  9    Date for SLP Re-Evaluation  10/20/17    Authorization Type  medicaid    SLP Start Time  0845    SLP Stop Time   0930    SLP Time Calculation (min)  45 min    Activity Tolerance  Patient tolerated treatment well       Past Medical History:  Diagnosis Date  . Anemia   . Antithrombin III deficiency (HCC)    ?pt not sure if true diagnosis  . Anxiety   . Apical mural thrombus   . Blood transfusion   . Cholecystitis 07/2016  . Coronary artery disease    a. apical LAD infarction '00. b. NSTEMI s/p BMS to prox LAD '09. c. Cath 01/2015: stable LAD stent, otherwise minimal nonobstructive CAD.  Marland Kitchen GERD (gastroesophageal reflux disease)   . Hyperlipidemia   . Hypertension   . Myocardial infarction (HCC)   . Noncompliance with medication regimen    a. h/o noncompliance with med regimen (previous running out of Coumadin).  . Stroke (HCC)    assoc with short term memory loss and right peripheral vision loss; age 76   . TIA (transient ischemic attack) 2010  . Tobacco abuse     Past Surgical History:  Procedure Laterality Date  . CARDIAC CATHETERIZATION    . CARDIAC CATHETERIZATION N/A 01/29/2015   Procedure: Left Heart Cath and Coronary Angiography;  Surgeon: Peter M Swaziland, MD;  Location: [facility] INVASIVE CV LAB;  Service: Cardiovascular;  Laterality: N/A;  . CHOLECYSTECTOMY N/A 08/14/2016   Procedure: LAPAROSCOPIC CHOLECYSTECTOMY;  Surgeon: Gaynelle Adu, MD;  Location: [facility] OR;  Service: General;  Laterality: N/A;  . COLONOSCOPY N/A 03/29/2014   Procedure: COLONOSCOPY;   Surgeon: Louis Meckel, MD;  Location: [facility] ENDOSCOPY;  Service: Endoscopy;  Laterality: N/A;  . CORONARY ANGIOPLASTY    . ESOPHAGOGASTRODUODENOSCOPY N/A 06/09/2012   Procedure: ESOPHAGOGASTRODUODENOSCOPY (EGD);  Surgeon: Theda Belfast, MD;  Location: [facility] ENDOSCOPY;  Service: Endoscopy;  Laterality: N/A;  . ESOPHAGOGASTRODUODENOSCOPY N/A 08/02/2013   Procedure: ESOPHAGOGASTRODUODENOSCOPY (EGD);  Surgeon: Meryl Dare, MD;  Location: [facility] ENDOSCOPY;  Service: Endoscopy;  Laterality: N/A;  . IR ANGIO INTRA EXTRACRAN SEL COM CAROTID INNOMINATE UNI R MOD SED  07/08/2017  . LEFT HEART CATHETERIZATION WITH CORONARY ANGIOGRAM N/A 12/24/2011   Procedure: LEFT HEART CATHETERIZATION WITH CORONARY ANGIOGRAM;  Surgeon: Kathleene Hazel, MD;  Location: [facility] CATH LAB;  Service: Cardiovascular;  Laterality: N/A;  . RADIOLOGY WITH ANESTHESIA N/A 07/08/2017   Procedure: IR WITH ANESTHESIA;  Surgeon: Radiologist, Medication, MD;  Location: [facility] OR;  Service: Radiology;  Laterality: N/A;  . TUBAL LIGATION      There were no vitals filed for this visit.      SLP Evaluation [facility] - 07/28/17 1118      SLP Visit Information   SLP Received On  07/24/17    Referring Provider  Dr. Claudette Laws    Onset Date  07/08/17    Medical Diagnosis  Right frontal CVA      Subjective   Patient/Family Stated Goal  "To get  back to normal"      Pain Assessment   Currently in Pain?  Yes    Pain Score  4     Pain Location  Head    Pain Orientation  Right    Pain Type  Acute pain    Pain Onset  1 to 4 weeks ago    Pain Frequency  Intermittent    Pain Relieving Factors  Tylenol      General Information   HPI  Pt admitted 07/08/17 with left facial droop and weakness Rt MCA infarct s/p IR. PMHx CVA at age 58, TIA 56, MI 2009, antithrombin III deficiency, apical mural thrombus, CAD, hypertension, hyperlipidemia.  MRI: Right lateral frontal lobe and insula acute/early subacute infarction. She was d/c'd from inpt rehabe  07/21/17.    Mobility Status  walks independently      Balance Screen   Has the patient fallen in the past 6 months  No    Has the patient had a decrease in activity level because of a fear of falling?   No    Is the patient reluctant to leave their home because of a fear of falling?   No      Prior Functional Status   Cognitive/Linguistic Baseline  Within functional limits    Type of Home  House     Lives With  Family    Available Support  Family    Vocation  Full time employment      Cognition   Overall Cognitive Status  Impaired/Different from baseline    Area of Impairment  Attention;Safety/judgement;Problem solving    Current Attention Level  Sustained    Safety/Judgement  Decreased awareness of safety    Problem Solving  Slow processing    Memory  Impaired    Memory Impairment  Decreased recall of new information    Awareness  Impaired    Awareness Impairment  Anticipatory impairment    Problem Solving  Impaired    Problem Solving Impairment  Verbal complex;Functional complex    Probation officer;Sequencing;Organizing;Self Correcting      Verbal Expression   Overall Verbal Expression  Appears within functional limits for tasks assessed      Oral Motor/Sensory Function   Overall Oral Motor/Sensory Function  Impaired    Labial ROM  Reduced left    Labial Symmetry  Abnormal symmetry left    Labial Strength  Reduced Left    Labial Sensation  Reduced Left    Labial Coordination  Reduced    Lingual ROM  Within Functional Limits    Facial ROM  Within Functional Limits    Velum  Within Functional Limits      Motor Speech   Overall Motor Speech  Impaired    Respiration  Within functional limits    Phonation  Normal    Resonance  Within functional limits    Articulation  Impaired    Level of Impairment  Conversation    Intelligibility  Intelligible    Conversation  75-100% accurate    Motor Planning  Witnin functional limits      Standardized Assessments    Standardized Assessments   Cognitive Linguistic Quick Test      Cognitive Linguistic Quick Test (Ages 18-69)   Attention  WNL    Memory  Mild    Executive Function  Mild    Language  Mild    Visuospatial Skills  Mild    Severity Rating Total  16  Composite Severity Rating  13.6                      SLP Education - 07/28/17 1128    Education provided  Yes    Education Details  compensations for attention/memory; cognitive activities to do at home    Person(s) Educated  Patient    Methods  Explanation    Comprehension  Verbalized understanding;Need further instruction       SLP Short Term Goals - 07/28/17 1137      SLP SHORT TERM GOAL #1   Title  Pt will complete HEP for dysarthria with rare min A.    Baseline  Pt is not completing exercises for dysarthria    Time  3    Period  Weeks    Status  New      SLP SHORT TERM GOAL #2   Title  Pt will solve functional money problems with rare min A.,    Baseline  Pt could not solve simple money problems on eval     Time  3    Period  Weeks    Status  New      SLP SHORT TERM GOAL #3   Title  Pt will alternate attention between 2 simple cognitive lingusitic tasks with 85% on each and rare min A    Baseline  Pt attention level is selective at eval    Time  3    Period  Weeks    Status  New      SLP SHORT TERM GOAL #4   Title  Pt will use external aids for daily recall and schedule management    Baseline  Pt is not uisng external aids to help her remember or attend    Time  3    Period  Weeks    Status  New       SLP Long Term Goals - 07/28/17 1142      SLP LONG TERM GOAL #1   Title  Pt will balance mildly complex money account with rare min A    Baseline  Pt unable to do simple check balancing at eval    Time  6    Period  Weeks    Status  New      SLP LONG TERM GOAL #2   Title  Pt will use external aids/system to manage her home financial management over 2 sessions     Baseline  Her mother is  currently managing her finances    Time  6    Period  Weeks    Status  New      SLP LONG TERM GOAL #3   Title  Pt will alternate attention between 2 mildly complex cognitive linguistic tasks with 85% on each and occasional min A    Baseline  Attention level is selective at eval    Time  6    Period  Weeks    Status  New       Plan - 07/28/17 1130    Clinical Impression Statement  Ms. Sorbello as 47 y.o. female was hospitalized for an acute right CVA 07/08/17 to 07/21/17. She is referred for outpt ST due to cognitive impairments and dysarthria. Prior to this CVA, pt was independent, working full time and managing her finances. Today, she presents with cognitive communication disorder and dysarthria affecting her ability to return to work and manage finances independently. She exhibited difficulty with short term memory, attention,  and simple money math/management. She did not attend to detail instructions on informal check writing/balancing task. Ms. 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 Her speech in intellgilble in this quiet environment, however in a noisy environment intelligibility may be reduced.  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.,    Speech Therapy Frequency  1x /week 2x a week for 3 weeks, 1x a week for 2 weeks    Treatment/Interventions  SLP instruction and feedback;Cueing hierarchy;Oral motor exercises;Cognitive reorganization;Functional tasks;Compensatory strategies;Patient/family education;Internal/external aids;Environmental controls       Patient will benefit from skilled therapeutic intervention in order to improve the following deficits and impairments:   Cognitive communication deficit - Plan: SLP plan of care  cert/re-cert  Dysarthria and anarthria - Plan: SLP plan of care cert/re-cert    Problem List Patient Active Problem List   Diagnosis Date Noted  . Acute blood loss anemia   . Vascular headache   . Hemiparesis affecting left side as late effect of stroke (HCC)   . Tobacco use disorder 07/13/2017  . Noncompliance with medication regimen 07/13/2017  . Acute ischemic right MCA stroke (HCC) 07/13/2017  . Benign essential HTN   . Stroke (cerebrum) (HCC) 07/08/2017  . Middle cerebral artery embolism, right 07/08/2017  . Vitamin B12 deficiency 08/15/2016  . Acute cholecystitis 08/14/2016  . GERD (gastroesophageal reflux disease) 04/24/2015  . Hypertensive urgency 04/23/2015  . Chest pain, unspecified   . Right leg swelling 03/07/2015  . Preventative health care 02/21/2015  . Esophageal reflux   . NSTEMI (non-ST elevated myocardial infarction) (HCC) 01/28/2015  . Nausea without vomiting 12/22/2014  . Dysuria 12/21/2014  . Diarrhea 09/26/2014  . Difficulty swallowing pills 09/26/2014  . Hemorrhoids 06/16/2014  . Elevated AST (SGOT) 03/27/2014  . Lumbar herniated disc 03/27/2014  . TIA (transient ischemic attack) 02/19/2014  . History of ischemic left PCA stroke 02/19/2014  . Palpitations 01/27/2014  . Depression 08/15/2013  . Long term current use of anticoagulant therapy 08/10/2013  . Gastritis and duodenitis 08/03/2013  . Diverticulosis 08/03/2013  . Fatty liver 08/03/2013  . Esophagitis 08/03/2013  . Hiatal hernia 08/03/2013  . Apical mural thrombus 08/02/2013  . GI bleeding 06/04/2012  . Hypokalemia 06/04/2012  . Antithrombin III deficiency (HCC) 06/04/2012  . Paresthesias 06/29/2011  . Hyperlipemia 06/07/2009  . Essential hypertension 05/17/2009  . OBESITY-MORBID (>100') 03/13/2008  . CAD S/P  BMS to proximal LAD 2009-patent 01/29/15 06/28/2007    Lovvorn, Radene Journey MS, CCC-SLP 07/28/2017, 11:47 AM  [facility] [address] [place], [place], [address] Phone: [phone]   Fax:  [phone]  Name: Lindsey [name] MRN: [id number] Date of Birth: 10-22-1970

## 2017-08-03 ENCOUNTER — Encounter: Payer: Self-pay | Admitting: Physical Therapy

## 2017-08-03 ENCOUNTER — Ambulatory Visit: Payer: Medicare Other | Admitting: Physical Therapy

## 2017-08-03 ENCOUNTER — Encounter: Payer: Medicare Other | Attending: Registered Nurse | Admitting: Registered Nurse

## 2017-08-03 ENCOUNTER — Encounter: Payer: Self-pay | Admitting: Registered Nurse

## 2017-08-03 VITALS — BP 117/83 | HR 69 | Resp 14 | Ht 65.0 in | Wt 168.0 lb

## 2017-08-03 DIAGNOSIS — I69352 Hemiplegia and hemiparesis following cerebral infarction affecting left dominant side: Secondary | ICD-10-CM | POA: Diagnosis not present

## 2017-08-03 DIAGNOSIS — I252 Old myocardial infarction: Secondary | ICD-10-CM | POA: Diagnosis not present

## 2017-08-03 DIAGNOSIS — Z8249 Family history of ischemic heart disease and other diseases of the circulatory system: Secondary | ICD-10-CM | POA: Diagnosis not present

## 2017-08-03 DIAGNOSIS — R261 Paralytic gait: Secondary | ICD-10-CM | POA: Diagnosis not present

## 2017-08-03 DIAGNOSIS — M6281 Muscle weakness (generalized): Secondary | ICD-10-CM | POA: Diagnosis not present

## 2017-08-03 DIAGNOSIS — I2510 Atherosclerotic heart disease of native coronary artery without angina pectoris: Secondary | ICD-10-CM | POA: Diagnosis not present

## 2017-08-03 DIAGNOSIS — F419 Anxiety disorder, unspecified: Secondary | ICD-10-CM | POA: Insufficient documentation

## 2017-08-03 DIAGNOSIS — Z9851 Tubal ligation status: Secondary | ICD-10-CM | POA: Insufficient documentation

## 2017-08-03 DIAGNOSIS — I69354 Hemiplegia and hemiparesis following cerebral infarction affecting left non-dominant side: Secondary | ICD-10-CM | POA: Diagnosis not present

## 2017-08-03 DIAGNOSIS — K219 Gastro-esophageal reflux disease without esophagitis: Secondary | ICD-10-CM | POA: Insufficient documentation

## 2017-08-03 DIAGNOSIS — R41840 Attention and concentration deficit: Secondary | ICD-10-CM | POA: Diagnosis not present

## 2017-08-03 DIAGNOSIS — Z9114 Patient's other noncompliance with medication regimen: Secondary | ICD-10-CM | POA: Insufficient documentation

## 2017-08-03 DIAGNOSIS — F17210 Nicotine dependence, cigarettes, uncomplicated: Secondary | ICD-10-CM | POA: Insufficient documentation

## 2017-08-03 DIAGNOSIS — Z9049 Acquired absence of other specified parts of digestive tract: Secondary | ICD-10-CM | POA: Insufficient documentation

## 2017-08-03 DIAGNOSIS — Z9889 Other specified postprocedural states: Secondary | ICD-10-CM | POA: Insufficient documentation

## 2017-08-03 DIAGNOSIS — E785 Hyperlipidemia, unspecified: Secondary | ICD-10-CM | POA: Insufficient documentation

## 2017-08-03 DIAGNOSIS — I63511 Cerebral infarction due to unspecified occlusion or stenosis of right middle cerebral artery: Secondary | ICD-10-CM | POA: Insufficient documentation

## 2017-08-03 DIAGNOSIS — G441 Vascular headache, not elsewhere classified: Secondary | ICD-10-CM | POA: Insufficient documentation

## 2017-08-03 DIAGNOSIS — I69315 Cognitive social or emotional deficit following cerebral infarction: Secondary | ICD-10-CM | POA: Diagnosis not present

## 2017-08-03 DIAGNOSIS — Z803 Family history of malignant neoplasm of breast: Secondary | ICD-10-CM | POA: Insufficient documentation

## 2017-08-03 DIAGNOSIS — I10 Essential (primary) hypertension: Secondary | ICD-10-CM | POA: Insufficient documentation

## 2017-08-03 DIAGNOSIS — R2681 Unsteadiness on feet: Secondary | ICD-10-CM | POA: Diagnosis not present

## 2017-08-03 NOTE — Patient Instructions (Addendum)
WALKING  Walking is a great form of exercise to increase your strength, endurance and overall fitness.  A walking program can help you start slowly and gradually build endurance as you go.  Everyone's ability is different, so each person's starting point will be different.  You do not have to follow them exactly.  The are just samples. You should simply find out what's right for you and stick to that program.   In the beginning, you'll start off walking 2 times a day for short distances.  As you get stronger, you'll be walking further at just 1-2 times per day.  A. You Can Walk For A Certain Length Of Time Each Day    Walk 12 minutes 2 times per day.  Increase 1-2 minutes every 7 days (2 times per day).  Work up to 25-30 minutes (1-2 times per day).   Please only do the exercises that your therapist has initialed and dated  Chair Sitting    Sit at edge of seat, spine straight, left leg extended out. Put a hand on each thigh and bend forward from the hip, keeping spine straight. Allow left hand on left leg to reach toward toes. Support upper body with right forearm propped on right thigh. Hold _30_ seconds. Repeat 3 times per session. Do 1-2 sessions per day.  Copyright  VHI. All rights reserved.    Calf Stretch    Place hands on wall at shoulder height. Keeping back left leg straight, bend right front leg, feet pointing forward, heels flat on floor. Lean forward slightly until stretch is felt in calf of back leg. Hold stretch _30__ seconds, breathing slowly in and out. Do ___ sessions per day.  Copyright  VHI. All rights reserved.   Perform these next to a counter/table/wall for balance assistance as needed:   "I love a Parade" Lift   At counter for balance as needed: high knee marching forward and then backward. 3 second pauses with each knee lift.  Repeat 3 laps each way. Do _1-2_ sessions per day. http://gt2.exer.us/345   Copyright  VHI. All rights reserved.  Walking  on Heels   At counter: Walk on heels forward while continuing on a straight path, and then walk on heels backward to starting position. Repeat for 3 laps each way. Do _1-2__ sessions per day.  Copyright  VHI. All rights reserved.   Feet Heel-Toe "Tandem"   At counter: Arms at sides, walk a straight line forward bringing one foot directly in front of the other, and then a straight line backwards bringing one foot directly behind the other one.  Repeat for _3 laps each way. Do _1-2_ sessions per day.  Copyright  VHI. All rights reserved.    Perform these in a corner with a chair in front of you for safety: Feet Together (Compliant Surface) Varied Arm Positions - Eyes Closed    Stand on compliant surface: _pillow/s_ with feet together and arms at sides. Close eyes and visualize upright position. Hold__30__ seconds. Repeat 3_ times per session. Do _1-2_ sessions per day.  Copyright  VHI. All rights reserved.    Feet Apart (Compliant Surface) Head Motion - Eyes Closed    Stand on compliant surface: _pillow/s_ with feet shoulder width apart. Close eyes and move head slowly: 1. Up and down x 10 reps 2. Left and right x 10 reps 3. Up to right at ceiling/down to left at floor (diagonal) x 10 reps 4. Up to left at ceiling/down to right  at floor (diagonal) x 10 reps  Do _1-2_ sessions per day.  Copyright  VHI. All rights reserved.    Single Leg - Eyes Open    Holding support, lift right leg while maintaining balance over other leg. Progress to removing hands from support surface for longer periods of time. Hold_10_ seconds.  Repeat with standing on the right leg, lifting left foot up. Progress to letting go as able. Hold 10 seconds.  Repeat _3_ times on each leg per session. Do _1-2_ sessions per day.  Copyright  VHI. All rights reserved.

## 2017-08-03 NOTE — Progress Notes (Signed)
Subjective:    Patient ID: Lindsey [name], female    DOB: 1971/02/03, 47 y.o.   MRN: [id number]  HPI: Lindsey [name] is 47 year old female who is here for transitional care visit in follow up of her acute ischemic right MCA stroke, hemiparesis affecting left side, vascular headaches and hypertension. She's been home [facility].  She reports good appetite and denies pain. She rated her pain on flow sheet 3, to this provider she reported she is pain free.   Pain Inventory Average Pain 3 Pain Right Now 3 My pain is no selection  In the last 24 hours, has pain interfered with the following? General activity 0 Relation with others 0 Enjoyment of life 0 What TIME of day is your pain at its worst? no selection Sleep (in general) Poor  Pain is worse with: no selection Pain improves with:  no selection Relief from Meds: no selection  Mobility walk without assistance ability to climb steps?  yes do you drive?  no  Function not employed: date last employed .  Neuro/Psych No problems in this area  Prior Studies Any changes since last visit?  no  Physicians involved in your care Any changes since last visit?  no   Family History  Problem Relation Age of Onset  . Heart disease Brother        arrhythmia; died  . Breast cancer Maternal Aunt    Social History   Socioeconomic History  . Marital status: Divorced    Spouse name: Not on file  . Number of children: 2  . Years of education: Not on file  . Highest education level: Not on file  Occupational History  . Occupation: Geneticist, molecular for [employer]  Social Needs  . Financial resource strain: Not on file  . Food insecurity:    Worry: Not on file    Inability: Not on file  . Transportation needs:    Medical: Not on file    Non-medical: Not on file  Tobacco Use  . Smoking status: Current Every Day Smoker    Packs/day: 0.20    Years: 1.00    Pack years:  0.20    Types: Cigarettes  . Smokeless tobacco: Never Used  . Tobacco comment: 2018  " i STILL SMOKE 3  CIGARETTES A DAY "  Substance and Sexual Activity  . Alcohol use: Yes    Alcohol/week: 0.0 oz    Comment: Social drinker  . Drug use: Not Currently    Types: Cocaine    Comment: hx of use x 1  . Sexual activity: Never    Birth control/protection: Surgical  Lifestyle  . Physical activity:    Days per week: Not on file    Minutes per session: Not on file  . Stress: Not on file  Relationships  . Social connections:    Talks on phone: Not on file    Gets together: Not on file    Attends religious service: Not on file    Active member of club or organization: Not on file    Attends meetings of clubs or organizations: Not on file    Relationship status: Not on file  Other Topics Concern  . Not on file  Social History Narrative  . Not on file   Past Surgical History:  Procedure Laterality Date  . CARDIAC CATHETERIZATION    . CARDIAC CATHETERIZATION N/A 01/29/2015   Procedure: Left Heart Cath and  Coronary Angiography;  Surgeon: Peter M Swaziland, MD;  Location: [facility] INVASIVE CV LAB;  Service: Cardiovascular;  Laterality: N/A;  . CHOLECYSTECTOMY N/A 08/14/2016   Procedure: LAPAROSCOPIC CHOLECYSTECTOMY;  Surgeon: Gaynelle Adu, MD;  Location: [facility] OR;  Service: General;  Laterality: N/A;  . COLONOSCOPY N/A 03/29/2014   Procedure: COLONOSCOPY;  Surgeon: Louis Meckel, MD;  Location: [facility] ENDOSCOPY;  Service: Endoscopy;  Laterality: N/A;  . CORONARY ANGIOPLASTY    . ESOPHAGOGASTRODUODENOSCOPY N/A 06/09/2012   Procedure: ESOPHAGOGASTRODUODENOSCOPY (EGD);  Surgeon: Theda Belfast, MD;  Location: [facility] ENDOSCOPY;  Service: Endoscopy;  Laterality: N/A;  . ESOPHAGOGASTRODUODENOSCOPY N/A 08/02/2013   Procedure: ESOPHAGOGASTRODUODENOSCOPY (EGD);  Surgeon: Meryl Dare, MD;  Location: [facility] ENDOSCOPY;  Service: Endoscopy;  Laterality: N/A;  . IR ANGIO INTRA EXTRACRAN SEL COM CAROTID INNOMINATE UNI R MOD  SED  07/08/2017  . LEFT HEART CATHETERIZATION WITH CORONARY ANGIOGRAM N/A 12/24/2011   Procedure: LEFT HEART CATHETERIZATION WITH CORONARY ANGIOGRAM;  Surgeon: Kathleene Hazel, MD;  Location: [facility] CATH LAB;  Service: Cardiovascular;  Laterality: N/A;  . RADIOLOGY WITH ANESTHESIA N/A 07/08/2017   Procedure: IR WITH ANESTHESIA;  Surgeon: Radiologist, Medication, MD;  Location: [facility] OR;  Service: Radiology;  Laterality: N/A;  . TUBAL LIGATION     Past Medical History:  Diagnosis Date  . Anemia   . Antithrombin III deficiency (HCC)    ?pt not sure if true diagnosis  . Anxiety   . Apical mural thrombus   . Blood transfusion   . Cholecystitis 07/2016  . Coronary artery disease    a. apical LAD infarction '00. b. NSTEMI s/p BMS to prox LAD '09. c. Cath 01/2015: stable LAD stent, otherwise minimal nonobstructive CAD.  Marland Kitchen GERD (gastroesophageal reflux disease)   . Hyperlipidemia   . Hypertension   . Myocardial infarction (HCC)   . Noncompliance with medication regimen    a. h/o noncompliance with med regimen (previous running out of Coumadin).  . Stroke (HCC)    assoc with short term memory loss and right peripheral vision loss; age 58   . TIA (transient ischemic attack) 2010  . Tobacco abuse    BP 117/83 (BP Location: Left Arm, Patient Position: Sitting, Cuff Size: Normal)   Pulse 69   Resp 14   Ht 5\' 5"  (1.651 m)   Wt 168 lb (76.2 kg)   SpO2 97%   BMI 27.96 kg/m   Opioid Risk Score:   Fall Risk Score:  `1  Depression screen PHQ 2/9  Depression screen [facility] 2/9 03/06/2015 02/20/2015 12/26/2014 09/26/2014 06/16/2014 08/15/2013  Decreased Interest 0 0 0 0 0 3  Down, Depressed, Hopeless 0 0 0 3 0 3  PHQ - 2 Score 0 0 0 3 0 6  Altered sleeping - - - 1 - 3  Tired, decreased energy - - - 1 - 3  Change in appetite - - - 1 - 0  Feeling bad or failure about yourself  - - - 1 - 3  Trouble concentrating - - - 1 - 3  Moving slowly or fidgety/restless - - - 1 - 3  Suicidal thoughts - - - 1 - 0   PHQ-9 Score - - - 10 - 21  Some recent data might be hidden   2 Review of Systems  Constitutional: Negative.   HENT: Negative.   Eyes: Negative.   Respiratory: Negative.   Cardiovascular: Negative.   Gastrointestinal: Negative.   Endocrine: Negative.   Genitourinary: Negative.   Musculoskeletal: Negative.  Skin: Negative.   Allergic/Immunologic: Negative.   Neurological: Negative.   Hematological: Negative.   Psychiatric/Behavioral: Negative.        Objective:   Physical Exam  Constitutional: She is oriented to person, place, and time. She appears well-developed and well-nourished.  HENT:  Head: Normocephalic and atraumatic.  Neck: Normal range of motion. Neck supple.  Cardiovascular: Normal rate and regular rhythm.  Pulmonary/Chest: Effort normal and breath sounds normal.  Musculoskeletal:  Normal Muscle Bulk and Muscle Testing Reveals: Upper Extremities: Full ROM and Muscle Strength on the Right 5/5 and Left: 3/5 Lower Extremities: Full ROM and Muscle Strength 5/5 Arises from Table with ease Gait is steady with normal steps  Neurological: She is alert and oriented to person, place, and time.  Skin: Skin is warm and dry.  Psychiatric: She has a normal mood and affect.  Nursing note and vitals reviewed.         Assessment & Plan:  1. Acute Ischemic Right MCA Stroke with Hemiparesis on Left Side: Continue with Outpatient Therapies and Follow up with Neurologist. 2. Vascular Headaches: Continue with Tylenol PRN. 3. Hypertension: Continue current medication regimen. PCP Following.   20 minutes of face to face patient care time was spent during this visit. All questions was encouraged and answered.  F/U 4-6 weeks with Dr. Wynn Banker .

## 2017-08-03 NOTE — Patient Instructions (Signed)
Dr. Pearlean Brownie: [phone]: Neurologist  [address]  Call for an appointment

## 2017-08-04 NOTE — Therapy (Signed)
[facility] [address] [place], [place], [address] Phone: [phone]   Fax:  [phone]  Physical Therapy Treatment  Patient Details  Name: Lindsey [name] MRN: [id number] Date of Birth: [birth year] Referring Provider: Claudette Laws, MD   Encounter Date: 08/03/2017  PT End of Session - 08/03/17 1452    Visit Number  2    Number of Visits  7    Authorization Type  Medicaid    PT Start Time  1449    PT Stop Time  1529    PT Time Calculation (min)  40 min    Equipment Utilized During Treatment  Gait belt    Activity Tolerance  Patient tolerated treatment well    Behavior During Therapy  [facility] for tasks assessed/performed       Past Medical History:  Diagnosis Date  . Anemia   . Antithrombin III deficiency (HCC)    ?pt not sure if true diagnosis  . Anxiety   . Apical mural thrombus   . Blood transfusion   . Cholecystitis 07/2016  . Coronary artery disease    a. apical LAD infarction '00. b. NSTEMI s/p BMS to prox LAD '09. c. Cath 01/2015: stable LAD stent, otherwise minimal nonobstructive CAD.  Marland Kitchen GERD (gastroesophageal reflux disease)   . Hyperlipidemia   . Hypertension   . Myocardial infarction (HCC)   . Noncompliance with medication regimen    a. h/o noncompliance with med regimen (previous running out of Coumadin).  . Stroke (HCC)    assoc with short term memory loss and right peripheral vision loss; age 47   . TIA (transient ischemic attack) 2010  . Tobacco abuse     Past Surgical History:  Procedure Laterality Date  . CARDIAC CATHETERIZATION    . CARDIAC CATHETERIZATION N/A 01/29/2015   Procedure: Left Heart Cath and Coronary Angiography;  Surgeon: Peter M Swaziland, MD;  Location: [facility] INVASIVE CV LAB;  Service: Cardiovascular;  Laterality: N/A;  . CHOLECYSTECTOMY N/A 08/14/2016   Procedure: LAPAROSCOPIC CHOLECYSTECTOMY;  Surgeon: Gaynelle Adu, MD;  Location: [facility] OR;  Service: General;  Laterality: N/A;   . COLONOSCOPY N/A 03/29/2014   Procedure: COLONOSCOPY;  Surgeon: Louis Meckel, MD;  Location: [facility] ENDOSCOPY;  Service: Endoscopy;  Laterality: N/A;  . CORONARY ANGIOPLASTY    . ESOPHAGOGASTRODUODENOSCOPY N/A 06/09/2012   Procedure: ESOPHAGOGASTRODUODENOSCOPY (EGD);  Surgeon: Theda Belfast, MD;  Location: [facility] ENDOSCOPY;  Service: Endoscopy;  Laterality: N/A;  . ESOPHAGOGASTRODUODENOSCOPY N/A 08/02/2013   Procedure: ESOPHAGOGASTRODUODENOSCOPY (EGD);  Surgeon: Meryl Dare, MD;  Location: [facility] ENDOSCOPY;  Service: Endoscopy;  Laterality: N/A;  . IR ANGIO INTRA EXTRACRAN SEL COM CAROTID INNOMINATE UNI R MOD SED  07/08/2017  . LEFT HEART CATHETERIZATION WITH CORONARY ANGIOGRAM N/A 12/24/2011   Procedure: LEFT HEART CATHETERIZATION WITH CORONARY ANGIOGRAM;  Surgeon: Kathleene Hazel, MD;  Location: [facility] CATH LAB;  Service: Cardiovascular;  Laterality: N/A;  . RADIOLOGY WITH ANESTHESIA N/A 07/08/2017   Procedure: IR WITH ANESTHESIA;  Surgeon: Radiologist, Medication, MD;  Location: [facility] OR;  Service: Radiology;  Laterality: N/A;  . TUBAL LIGATION      There were no vitals filed for this visit.  Subjective Assessment - 08/03/17 1451    Subjective  No new complaints. No falls. Does have trouble sleeping.     Pertinent History  R CVA 07/08/17 & hx of ischemic left PCA stroke, HTN, NSTEMI, CAD, MI, TIA,    Limitations  Lifting;Walking;House hold activities;Standing    Patient  Stated Goals  walking, stairs, getting in/out shower, no falls,     Currently in Pain?  Yes    Pain Score  4     Pain Location  Leg    Pain Orientation  Left    Pain Descriptors / Indicators  Aching    Pain Type  Acute pain    Pain Onset  In the past 7 days    Pain Frequency  Constant    Aggravating Factors   none    Pain Relieving Factors  Tylenol      Treatment:  Issued the following to pt's HEP with min guard assist for balance. Cues needed on ex form and technique.   WALKING  Walking is a great form of exercise  to increase your strength, endurance and overall fitness.  A walking program can help you start slowly and gradually build endurance as you go.  Everyone's ability is different, so each person's starting point will be different.  You do not have to follow them exactly.  The are just samples. You should simply find out what's right for you and stick to that program.   In the beginning, you'll start off walking 2 times a day for short distances.  As you get stronger, you'll be walking further at just 1-2 times per day.  A. You Can Walk For A Certain Length Of Time Each Day    Walk 12 minutes 2 times per day.  Increase 1-2 minutes every 7 days (2 times per day).  Work up to 25-30 minutes (1-2 times per day).   Please only do the exercises that your therapist has initialed and dated  Chair Sitting    Sit at edge of seat, spine straight, left leg extended out. Put a hand on each thigh and bend forward from the hip, keeping spine straight. Allow left hand on left leg to reach toward toes. Support upper body with right forearm propped on right thigh. Hold _30_ seconds. Repeat 3 times per session. Do 1-2 sessions per day.  Copyright  VHI. All rights reserved.    Calf Stretch    Place hands on wall at shoulder height. Keeping back left leg straight, bend right front leg, feet pointing forward, heels flat on floor. Lean forward slightly until stretch is felt in calf of back leg. Hold stretch _30__ seconds, breathing slowly in and out. Do ___ sessions per day.  Copyright  VHI. All rights reserved.   Perform these next to a counter/table/wall for balance assistance as needed:   "I love a Parade" Lift   At counter for balance as needed: high knee marching forward and then backward. 3 second pauses with each knee lift.  Repeat 3 laps each way. Do _1-2_ sessions per day. http://gt2.exer.us/345   Copyright  VHI. All rights reserved.  Walking on Heels   At counter: Walk on heels forward  while continuing on a straight path, and then walk on heels backward to starting position. Repeat for 3 laps each way. Do _1-2__ sessions per day.  Copyright  VHI. All rights reserved.   Feet Heel-Toe "Tandem"   At counter: Arms at sides, walk a straight line forward bringing one foot directly in front of the other, and then a straight line backwards bringing one foot directly behind the other one.  Repeat for _3 laps each way. Do _1-2_ sessions per day.  Copyright  VHI. All rights reserved.    Perform these in a corner with a chair in front  of you for safety: Feet Together (Compliant Surface) Varied Arm Positions - Eyes Closed    Stand on compliant surface: _pillow/s_ with feet together and arms at sides. Close eyes and visualize upright position. Hold__30__ seconds. Repeat 3_ times per session. Do _1-2_ sessions per day.  Copyright  VHI. All rights reserved.    Feet Apart (Compliant Surface) Head Motion - Eyes Closed    Stand on compliant surface: _pillow/s_ with feet shoulder width apart. Close eyes and move head slowly: 1. Up and down x 10 reps 2. Left and right x 10 reps 3. Up to right at ceiling/down to left at floor (diagonal) x 10 reps 4. Up to left at ceiling/down to right at floor (diagonal) x 10 reps  Do _1-2_ sessions per day.  Copyright  VHI. All rights reserved.    Single Leg - Eyes Open    Holding support, lift right leg while maintaining balance over other leg. Progress to removing hands from support surface for longer periods of time. Hold_10_ seconds.  Repeat with standing on the right leg, lifting left foot up. Progress to letting go as able. Hold 10 seconds.  Repeat _3_ times on each leg per session. Do _1-2_ sessions per day.  Copyright  VHI. All rights reserved.        PT Education - 08/03/17 1528    Education provided  Yes    Education Details  HEP for LE stretching and balance.     Person(s) Educated  Patient    Methods   Explanation;Demonstration;Verbal cues;Handout    Comprehension  Verbalized understanding;Returned demonstration;Need further instruction;Verbal cues required       PT Short Term Goals - 07/22/17 2309      PT SHORT TERM GOAL #1   Title  Patient demonstrates understanding of initial HEP. (All STGs Target Date 4th visit)    Baseline  Patient is dependent in proper exercises with CVA.     Time  3    Period  Weeks    Status  New      PT SHORT TERM GOAL #2   Title  Patient ambulates 500' outdoors including ramps & curbs without device with supervision.    Baseline  Patient ambulates 300' indoor surfaces with gait deviations and balance issues with scanning & negotiating obstacles.     Time  3    Period  Weeks    Status  New      PT SHORT TERM GOAL #3   Title  Patient is able to ambulate while performing simple cognitive task without balance loss.     Baseline  Patient has balance deficits & loss when ambulating while performing simple cognitive tasks.     Time  3    Period  Weeks    Status  New        PT Long Term Goals - 07/22/17 2316      PT LONG TERM GOAL #1   Title  Patient verbalizes & demonstrates ongoing HEP/ fitness plan. (All LTGs Target Date: 7th visit)    Baseline  Patient is dependent in proper exercises with CVA.    Time  6    Period  Weeks    Status  New      PT LONG TERM GOAL #2   Title  Berg Balance </= 54/56    Baseline  Berg Balance 51/56    Time  6    Period  Weeks    Status  New  PT LONG TERM GOAL #3   Title  Cognitive (simple naming task A-Z) Timed Up-Go <15.5 seconds     Baseline  Cognitive TUG 21.22seconds    Time  6    Period  Weeks    Status  New      PT LONG TERM GOAL #4   Title  Functional Gait Assessment >/= 19/30    Baseline  Functional Gait Assessment 9/30    Time  6    Period  Weeks    Status  New      PT LONG TERM GOAL #5   Title  Patient ambulates 1000' outdoors including grass, ramps, curbs & stairs single rail without  device modified independent.     Baseline  Patient ambulates 300' indoors with close supervision & gait deviations placing patient at high fall risk.     Time  6    Period  Weeks    Status  New            Plan - 08/03/17 1452    Clinical Impression Statement  Today's skilled session focused on establishing an HEP to address strengthening and balance. No issues were reported with performance in session today. Pt is progressing toward goals and should benefit from continued PT to progress toward unmet goals.     Rehab Potential  Good    PT Frequency  1x / week    PT Duration  6 weeks    PT Treatment/Interventions  ADLs/Self Care Home Management;Gait training;Stair training;Functional mobility training;Therapeutic activities;Therapeutic exercise;Balance training;Neuromuscular re-education;Patient/family education;Canalith Repostioning;Vestibular    PT Next Visit Plan  review HEP issued today as needed; continue to address balance deficits, gait on outdoor surfaces/compliant surfaces, dynamic gait activities, dual tasking with gait.     Consulted and Agree with Plan of Care  Patient       Patient will benefit from skilled therapeutic intervention in order to improve the following deficits and impairments:  Abnormal gait, Decreased activity tolerance, Decreased balance, Decreased coordination, Decreased endurance, Decreased mobility, Decreased strength  Visit Diagnosis: Hemiplegia and hemiparesis following cerebral infarction affecting left dominant side (HCC)  Muscle weakness (generalized)  Paralytic gait     Problem List Patient Active Problem List   Diagnosis Date Noted  . Acute blood loss anemia   . Vascular headache   . Hemiparesis affecting left side as late effect of stroke (HCC)   . Tobacco use disorder 07/13/2017  . Noncompliance with medication regimen 07/13/2017  . Acute ischemic right MCA stroke (HCC) 07/13/2017  . Benign essential HTN   . Stroke (cerebrum) (HCC)  07/08/2017  . Middle cerebral artery embolism, right 07/08/2017  . Vitamin B12 deficiency 08/15/2016  . Acute cholecystitis 08/14/2016  . GERD (gastroesophageal reflux disease) 04/24/2015  . Hypertensive urgency 04/23/2015  . Chest pain, unspecified   . Right leg swelling 03/07/2015  . Preventative health care 02/21/2015  . Esophageal reflux   . NSTEMI (non-ST elevated myocardial infarction) (HCC) 01/28/2015  . Nausea without vomiting 12/22/2014  . Dysuria 12/21/2014  . Diarrhea 09/26/2014  . Difficulty swallowing pills 09/26/2014  . Hemorrhoids 06/16/2014  . Elevated AST (SGOT) 03/27/2014  . Lumbar herniated disc 03/27/2014  . TIA (transient ischemic attack) 02/19/2014  . History of ischemic left PCA stroke 02/19/2014  . Palpitations 01/27/2014  . Depression 08/15/2013  . Long term current use of anticoagulant therapy 08/10/2013  . Gastritis and duodenitis 08/03/2013  . Diverticulosis 08/03/2013  . Fatty liver 08/03/2013  . Esophagitis 08/03/2013  .  Hiatal hernia 08/03/2013  . Apical mural thrombus 08/02/2013  . GI bleeding 06/04/2012  . Hypokalemia 06/04/2012  . Antithrombin III deficiency (HCC) 06/04/2012  . Paresthesias 06/29/2011  . Hyperlipemia 06/07/2009  . Essential hypertension 05/17/2009  . OBESITY-MORBID (>100') 03/13/2008  . CAD S/P  BMS to proximal LAD 2009-patent 01/29/15 06/28/2007    Sallyanne Kuster, PTA, [facility] [address] [place], [place] [address] [phone] 08/04/17, 9:21 PM   Name: JANASIA COVERDALE MRN: [id number] Date of Birth: 1971-02-04

## 2017-08-05 ENCOUNTER — Encounter: Payer: Self-pay | Admitting: Occupational Therapy

## 2017-08-05 ENCOUNTER — Ambulatory Visit: Payer: Medicare Other | Admitting: Occupational Therapy

## 2017-08-05 DIAGNOSIS — M6281 Muscle weakness (generalized): Secondary | ICD-10-CM

## 2017-08-05 DIAGNOSIS — R261 Paralytic gait: Secondary | ICD-10-CM | POA: Diagnosis not present

## 2017-08-05 DIAGNOSIS — I69315 Cognitive social or emotional deficit following cerebral infarction: Secondary | ICD-10-CM | POA: Diagnosis not present

## 2017-08-05 DIAGNOSIS — I69352 Hemiplegia and hemiparesis following cerebral infarction affecting left dominant side: Secondary | ICD-10-CM

## 2017-08-05 DIAGNOSIS — R2681 Unsteadiness on feet: Secondary | ICD-10-CM | POA: Diagnosis not present

## 2017-08-05 DIAGNOSIS — R41842 Visuospatial deficit: Secondary | ICD-10-CM

## 2017-08-05 DIAGNOSIS — R41840 Attention and concentration deficit: Secondary | ICD-10-CM | POA: Diagnosis not present

## 2017-08-05 NOTE — Patient Instructions (Addendum)
Access Code: [email or web address]  URL: [email or web address]  Date: 08/05/2017  Prepared by: Mackie Pai   Program Notes  Green putty   Exercises  Tip Pinch with Putty - 5 reps - 2 sets - 0 seconds hold - 2x daily - 7x weekly  Composite Flexion with Putty - 20 reps - 2 sets - 0 seconds hold - 2x daily - 7x weekly

## 2017-08-05 NOTE — Therapy (Signed)
[facility] [address] [place], [place], [address] Phone: [phone]   Fax:  [phone]  Occupational Therapy Treatment  Patient Details  Name: Lindsey [name] MRN: [id number] Date of Birth: 1970/06/20 Referring Provider: Claudette Laws, MD   Encounter Date: 08/05/2017  OT End of Session - 08/05/17 1106    Visit Number  2    Number of Visits  9    Authorization Type  Medicaid    OT Start Time  1018    OT Stop Time  1101    OT Time Calculation (min)  43 min    Activity Tolerance  Patient tolerated treatment well       Past Medical History:  Diagnosis Date  . Anemia   . Antithrombin III deficiency (HCC)    ?pt not sure if true diagnosis  . Anxiety   . Apical mural thrombus   . Blood transfusion   . Cholecystitis 07/2016  . Coronary artery disease    a. apical LAD infarction '00. b. NSTEMI s/p BMS to prox LAD '09. c. Cath 01/2015: stable LAD stent, otherwise minimal nonobstructive CAD.  Marland Kitchen GERD (gastroesophageal reflux disease)   . Hyperlipidemia   . Hypertension   . Myocardial infarction (HCC)   . Noncompliance with medication regimen    a. h/o noncompliance with med regimen (previous running out of Coumadin).  . Stroke (HCC)    assoc with short term memory loss and right peripheral vision loss; age 43   . TIA (transient ischemic attack) 2010  . Tobacco abuse     Past Surgical History:  Procedure Laterality Date  . CARDIAC CATHETERIZATION    . CARDIAC CATHETERIZATION N/A 01/29/2015   Procedure: Left Heart Cath and Coronary Angiography;  Surgeon: Peter M Swaziland, MD;  Location: [facility] INVASIVE CV LAB;  Service: Cardiovascular;  Laterality: N/A;  . CHOLECYSTECTOMY N/A 08/14/2016   Procedure: LAPAROSCOPIC CHOLECYSTECTOMY;  Surgeon: Gaynelle Adu, MD;  Location: [facility] OR;  Service: General;  Laterality: N/A;  . COLONOSCOPY N/A 03/29/2014   Procedure: COLONOSCOPY;  Surgeon: Louis Meckel, MD;  Location: [facility]  ENDOSCOPY;  Service: Endoscopy;  Laterality: N/A;  . CORONARY ANGIOPLASTY    . ESOPHAGOGASTRODUODENOSCOPY N/A 06/09/2012   Procedure: ESOPHAGOGASTRODUODENOSCOPY (EGD);  Surgeon: Theda Belfast, MD;  Location: [facility] ENDOSCOPY;  Service: Endoscopy;  Laterality: N/A;  . ESOPHAGOGASTRODUODENOSCOPY N/A 08/02/2013   Procedure: ESOPHAGOGASTRODUODENOSCOPY (EGD);  Surgeon: Meryl Dare, MD;  Location: [facility] ENDOSCOPY;  Service: Endoscopy;  Laterality: N/A;  . IR ANGIO INTRA EXTRACRAN SEL COM CAROTID INNOMINATE UNI R MOD SED  07/08/2017  . LEFT HEART CATHETERIZATION WITH CORONARY ANGIOGRAM N/A 12/24/2011   Procedure: LEFT HEART CATHETERIZATION WITH CORONARY ANGIOGRAM;  Surgeon: Kathleene Hazel, MD;  Location: [facility] CATH LAB;  Service: Cardiovascular;  Laterality: N/A;  . RADIOLOGY WITH ANESTHESIA N/A 07/08/2017   Procedure: IR WITH ANESTHESIA;  Surgeon: Radiologist, Medication, MD;  Location: [facility] OR;  Service: Radiology;  Laterality: N/A;  . TUBAL LIGATION      There were no vitals filed for this visit.  Subjective Assessment - 08/05/17 1024    Subjective   This was a good session    Pertinent History  R MCA CVA on 07/08/2017; visual deficits    Patient Stated Goals  I want to gain confidence to do things normally again    Currently in Pain?  No/denies                   OT Treatments/Exercises (  OP) - 08/05/17 0001      ADLs   ADL Comments  reviewed goals and pt in agreement. Written copy provided to pt along with folder to assist pt in staying organized with info      Exercises   Exercises  Hand      Hand Exercises   Hand Gripper with Small Beads  Gripper on #2 to  pick up 1 inch blocks - pt with moderate dropping but performance improved with cues. Pt rated fatigue at 3/10 after activity.     Other Hand Exercises  8 pegs in green putty - pt with moderate difficulty      Other Hand Exercises  Instructed pt in HEP to address grip and pinch strength using green putty. Pt needed increased  time howvever after practice and repetition could return demonstrate.  WIll monitor next session to ensure carry over.              OT Education - 08/05/17 1055    Education provided  Yes    Education Details  HEP for L grip and pinch strength green putty    Person(s) Educated  Patient    Methods  Explanation;Demonstration;Handout    Comprehension  Verbalized understanding;Returned demonstration       OT Short Term Goals - 08/05/17 1055      OT SHORT TERM GOAL #1   Title  Patient will complete a home exercise program designed to improve strength in left hand with min prompting due 6/8    Baseline  No current HEP    Time  3    Period  Weeks    Status  On-going      OT SHORT TERM GOAL #2   Title  Patient will complete a home activities program designed to increase her use of left hand as dominant 50% time with min prompting    Baseline  Patient is not spontaneoulsly using left hand as dominant    Time  3    Period  Weeks    Status  On-going      OT SHORT TERM GOAL #3   Title  Patient will utilize stove top to effectively to cook one item with supervision    Baseline  Patient is not cooking using stove or oven    Time  3    Period  Weeks    Status  On-going        OT Long Term Goals - 08/05/17 1056      OT LONG TERM GOAL #1   Title  Patient will complete a home exercise program for LUE strengthening independently (due 6/23 - see [employer] approval end date)    Baseline  Patient does not currently have a HEP    Time  6    Period  Weeks    Status  On-going      OT LONG TERM GOAL #2   Title  Patient will utilize left hand as dominant 75% time during ADL/IADL without prompting    Baseline  Patient is only using left hand when encouraged to do so per her report    Time  6    Period  Weeks    Status  On-going      OT LONG TERM GOAL #3   Title  Patient will utilize a medication management system to help her comply with daily medications with supervision    Baseline   Patient's mother is organizing medication for patient    Time  6    Period  Weeks    Status  On-going      OT LONG TERM GOAL #4   Title  Patient will demonstrate an 8 lb imprvement in left hand strength    Baseline  35 lb on 07/23/17    Time  6    Period  Weeks    Status  On-going      OT LONG TERM GOAL #5   Title  Patent will independently complete hair care and doffing bra in reasonable timeframe.      Baseline  Patient's daughter is assisting with hair care, and unfastening bra    Time  6    Period  Weeks    Status  On-going            Plan - 08/05/17 1104    Clinical Impression Statement  Pt progressing toward goals. 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  Has medical history significant for blood clotting disorder history of heart attacks and stroke.      Occupational performance deficits (Please refer to evaluation for details):  ADL's;IADL's;Work;Leisure;Social Participation    Rehab Potential  Good    OT Frequency  2x / week    OT Treatment/Interventions  Self-care/ADL training;Therapeutic exercise;Functional Mobility Training;Balance training;Neuromuscular education;Energy conservation;Therapeutic activities;Coping strategies training;Cognitive remediation/compensation;DME and/or AE instruction;Visual/perceptual remediation/compensation;Patient/family education    Plan  Please complete assessment for cognition- MOCA, Trail Making, Symbols, and assess visual fields - check carry over for theraputty HEP, functional tasks using L hand as dominant    Consulted and Agree with Plan of Care  Patient       Patient will benefit from skilled therapeutic intervention in order to improve the following deficits and impairments:  Decreased endurance, Impaired vision/preception, Decreased coping skills,  Impaired perceived functional ability, Decreased activity tolerance, Decreased strength, Decreased balance, Decreased cognition, Decreased coordination, Decreased safety awareness, Impaired UE functional use  Visit Diagnosis: Hemiplegia and hemiparesis following cerebral infarction affecting left dominant side (HCC)  Muscle weakness (generalized)  Attention and concentration deficit  Cognitive social or emotional deficit following cerebral infarction  Visuospatial deficit    Problem List Patient Active Problem List   Diagnosis Date Noted  . Acute blood loss anemia   . Vascular headache   . Hemiparesis affecting left side as late effect of stroke (HCC)   . Tobacco use disorder 07/13/2017  . Noncompliance with medication regimen 07/13/2017  . Acute ischemic right MCA stroke (HCC) 07/13/2017  . Benign essential HTN   . Stroke (cerebrum) (HCC) 07/08/2017  . Middle cerebral artery embolism, right 07/08/2017  . Vitamin B12 deficiency 08/15/2016  . Acute cholecystitis 08/14/2016  . GERD (gastroesophageal reflux disease) 04/24/2015  . Hypertensive urgency 04/23/2015  . Chest pain, unspecified   . Right leg swelling 03/07/2015  . Preventative health care 02/21/2015  . Esophageal reflux   . NSTEMI (non-ST elevated myocardial infarction) (HCC) 01/28/2015  . Nausea without vomiting 12/22/2014  . Dysuria 12/21/2014  . Diarrhea 09/26/2014  . Difficulty swallowing pills 09/26/2014  . Hemorrhoids 06/16/2014  . Elevated AST (SGOT) 03/27/2014  . Lumbar herniated disc 03/27/2014  . TIA (transient ischemic attack) 02/19/2014  . History of ischemic left PCA stroke 02/19/2014  . Palpitations 01/27/2014  . Depression 08/15/2013  . Long term current use of anticoagulant therapy 08/10/2013  .  Gastritis and duodenitis 08/03/2013  . Diverticulosis 08/03/2013  . Fatty liver 08/03/2013  . Esophagitis 08/03/2013  . Hiatal hernia 08/03/2013  . Apical mural thrombus 08/02/2013  . GI bleeding  06/04/2012  . Hypokalemia 06/04/2012  . Antithrombin III deficiency (HCC) 06/04/2012  . Paresthesias 06/29/2011  . Hyperlipemia 06/07/2009  . Essential hypertension 05/17/2009  . OBESITY-MORBID (>100') 03/13/2008  . CAD S/P  BMS to proximal LAD 2009-patent 01/29/15 06/28/2007    Norton Pastel, OTR/L 08/05/2017, 11:09 AM  [facility] [address] [place], [place], [address] Phone: [phone]   Fax:  [phone]  Name: Lindsey [name] MRN: [id number] Date of Birth: 09-10-1970

## 2017-08-11 ENCOUNTER — Ambulatory Visit: Payer: Medicare Other | Admitting: Speech Pathology

## 2017-08-11 ENCOUNTER — Encounter: Payer: Self-pay | Admitting: Speech Pathology

## 2017-08-11 DIAGNOSIS — R41840 Attention and concentration deficit: Secondary | ICD-10-CM | POA: Diagnosis not present

## 2017-08-11 DIAGNOSIS — R2681 Unsteadiness on feet: Secondary | ICD-10-CM | POA: Diagnosis not present

## 2017-08-11 DIAGNOSIS — R261 Paralytic gait: Secondary | ICD-10-CM | POA: Diagnosis not present

## 2017-08-11 DIAGNOSIS — R41841 Cognitive communication deficit: Secondary | ICD-10-CM

## 2017-08-11 DIAGNOSIS — I69315 Cognitive social or emotional deficit following cerebral infarction: Secondary | ICD-10-CM | POA: Diagnosis not present

## 2017-08-11 DIAGNOSIS — I69352 Hemiplegia and hemiparesis following cerebral infarction affecting left dominant side: Secondary | ICD-10-CM | POA: Diagnosis not present

## 2017-08-11 DIAGNOSIS — M6281 Muscle weakness (generalized): Secondary | ICD-10-CM | POA: Diagnosis not present

## 2017-08-11 NOTE — Therapy (Signed)
[facility] [address] [place], [place], [address] Phone: [phone]   Fax:  [phone]  Speech Language Pathology Treatment  Patient Details  Name: SHAQUAYLA KLIMAS MRN: [id number] Date of Birth: Jun 09, 1970 Referring Provider: Dr. Claudette Laws   Encounter Date: 08/11/2017  End of Session - 08/11/17 1209    Visit Number  2    Number of Visits  9    Date for SLP Re-Evaluation  10/20/17    Authorization Type  medicaid    SLP Start Time  1102    SLP Stop Time   1145    SLP Time Calculation (min)  43 min    Activity Tolerance  Patient tolerated treatment well       Past Medical History:  Diagnosis Date  . Anemia   . Antithrombin III deficiency (HCC)    ?pt not sure if true diagnosis  . Anxiety   . Apical mural thrombus   . Blood transfusion   . Cholecystitis 07/2016  . Coronary artery disease    a. apical LAD infarction '00. b. NSTEMI s/p BMS to prox LAD '09. c. Cath 01/2015: stable LAD stent, otherwise minimal nonobstructive CAD.  Marland Kitchen GERD (gastroesophageal reflux disease)   . Hyperlipidemia   . Hypertension   . Myocardial infarction (HCC)   . Noncompliance with medication regimen    a. h/o noncompliance with med regimen (previous running out of Coumadin).  . Stroke (HCC)    assoc with short term memory loss and right peripheral vision loss; age 15   . TIA (transient ischemic attack) 2010  . Tobacco abuse     Past Surgical History:  Procedure Laterality Date  . CARDIAC CATHETERIZATION    . CARDIAC CATHETERIZATION N/A 01/29/2015   Procedure: Left Heart Cath and Coronary Angiography;  Surgeon: Peter M Swaziland, MD;  Location: [facility] INVASIVE CV LAB;  Service: Cardiovascular;  Laterality: N/A;  . CHOLECYSTECTOMY N/A 08/14/2016   Procedure: LAPAROSCOPIC CHOLECYSTECTOMY;  Surgeon: Gaynelle Adu, MD;  Location: [facility] OR;  Service: General;  Laterality: N/A;  . COLONOSCOPY N/A 03/29/2014   Procedure: COLONOSCOPY;   Surgeon: Louis Meckel, MD;  Location: [facility] ENDOSCOPY;  Service: Endoscopy;  Laterality: N/A;  . CORONARY ANGIOPLASTY    . ESOPHAGOGASTRODUODENOSCOPY N/A 06/09/2012   Procedure: ESOPHAGOGASTRODUODENOSCOPY (EGD);  Surgeon: Theda Belfast, MD;  Location: [facility] ENDOSCOPY;  Service: Endoscopy;  Laterality: N/A;  . ESOPHAGOGASTRODUODENOSCOPY N/A 08/02/2013   Procedure: ESOPHAGOGASTRODUODENOSCOPY (EGD);  Surgeon: Meryl Dare, MD;  Location: [facility] ENDOSCOPY;  Service: Endoscopy;  Laterality: N/A;  . IR ANGIO INTRA EXTRACRAN SEL COM CAROTID INNOMINATE UNI R MOD SED  07/08/2017  . LEFT HEART CATHETERIZATION WITH CORONARY ANGIOGRAM N/A 12/24/2011   Procedure: LEFT HEART CATHETERIZATION WITH CORONARY ANGIOGRAM;  Surgeon: Kathleene Hazel, MD;  Location: [facility] CATH LAB;  Service: Cardiovascular;  Laterality: N/A;  . RADIOLOGY WITH ANESTHESIA N/A 07/08/2017   Procedure: IR WITH ANESTHESIA;  Surgeon: Radiologist, Medication, MD;  Location: [facility] OR;  Service: Radiology;  Laterality: N/A;  . TUBAL LIGATION      There were no vitals filed for this visit.  Subjective Assessment - 08/11/17 1107    Subjective  "I'm getting better" re: facial exercises    Currently in Pain?  No/denies            ADULT SLP TREATMENT - 08/11/17 1108      General Information   Behavior/Cognition  Alert;Cooperative;Pleasant mood      Treatment Provided   Treatment  provided  Cognitive-Linquistic      Cognitive-Linquistic Treatment   Treatment focused on  Cognition    Skilled Treatment  Targeted financial management and balancing with minimal extended time and usual written cues - I enocuraged pt to use mental math, which she did with success occasionally. Attention to detail in financial tasks with rare min A. Exteneded time consistently for reduced working memory and processing. Trained pt in compensations for losing her glasses, phone  and keys - pt to designate a basket for these and use visual reminders (notes) to use  basket consistently. She reports visual aid (sign) on bathroom mirror to turn off shower has been successful. Instructed pt to start using her bill organization system and calendar to keep track of her balance, deposits and bills even though her mother is managing this for her.       Assessment / Recommendations / Plan   Plan  Continue with current plan of care      Progression Toward Goals   Progression toward goals  Progressing toward goals       SLP Education - 08/11/17 1203    Education provided  Yes    Education Details  Compensations for attention/concentration    Person(s) Educated  Patient    Methods  Explanation;Demonstration;Handout    Comprehension  Verbalized understanding;Returned demonstration       SLP Short Term Goals - 08/11/17 1208      SLP SHORT TERM GOAL #1   Title  Pt will complete HEP for dysarthria with rare min A.    Baseline  Pt is not completing exercises for dysarthria    Time  3    Period  Weeks    Status  On-going      SLP SHORT TERM GOAL #2   Title  Pt will solve functional money problems with rare min A.,    Baseline  Pt could not solve simple money problems on eval     Time  3    Period  Weeks    Status  On-going      SLP SHORT TERM GOAL #3   Title  Pt will alternate attention between 2 simple cognitive lingusitic tasks with 85% on each and rare min A    Baseline  Pt attention level is selective at eval    Time  3    Period  Weeks    Status  On-going      SLP SHORT TERM GOAL #4   Title  Pt will use external aids for daily recall and schedule management    Baseline  Pt is not uisng external aids to help her remember or attend    Time  3    Period  Weeks    Status  On-going       SLP Long Term Goals - 08/11/17 1209      SLP LONG TERM GOAL #1   Title  Pt will balance mildly complex money account with rare min A    Baseline  Pt unable to do simple check balancing at eval    Time  6    Period  Weeks    Status  On-going      SLP  LONG TERM GOAL #2   Title  Pt will use external aids/system to manage her home financial management over 2 sessions     Baseline  Her mother is currently managing her finances    Time  6    Period  Weeks  Status  On-going      SLP LONG TERM GOAL #3   Title  Pt will alternate attention between 2 mildly complex cognitive linguistic tasks with 85% on each and occasional min A    Baseline  Attention level is selective at eval    Time  6    Period  Weeks    Status  On-going       Plan - 08/11/17 1204    Clinical Impression Statement  Pt requires extended time and no distractions to complete mental and written financial mangagment problems. Pt to continue to use external aids, such as a basket for her keys/glasses and signs for reminders to bring therapy notebook and turn off the shower. Initiated training in compensations for reduced attention and memory. Contnue skilled ST to maximize cognition for improved independence with high levle ADL's and possible return to work. Dysarthria has resolved to minimal, pt reports good follow up with oral motor exercises for labial droop.     Speech Therapy Frequency  1x /week 2x a week for 3 weeks, 1x a week for 2 weeks    Duration  -- 2x a week for 3 weeks, 1x a week for 2 weeks    Treatment/Interventions  SLP instruction and feedback;Cueing hierarchy;Oral motor exercises;Cognitive reorganization;Functional tasks;Compensatory strategies;Patient/family education;Internal/external aids;Environmental controls       Patient will benefit from skilled therapeutic intervention in order to improve the following deficits and impairments:   Cognitive communication deficit    Problem List Patient Active Problem List   Diagnosis Date Noted  . Acute blood loss anemia   . Vascular headache   . Hemiparesis affecting left side as late effect of stroke (HCC)   . Tobacco use disorder 07/13/2017  . Noncompliance with medication regimen 07/13/2017  . Acute  ischemic right MCA stroke (HCC) 07/13/2017  . Benign essential HTN   . Stroke (cerebrum) (HCC) 07/08/2017  . Middle cerebral artery embolism, right 07/08/2017  . Vitamin B12 deficiency 08/15/2016  . Acute cholecystitis 08/14/2016  . GERD (gastroesophageal reflux disease) 04/24/2015  . Hypertensive urgency 04/23/2015  . Chest pain, unspecified   . Right leg swelling 03/07/2015  . Preventative health care 02/21/2015  . Esophageal reflux   . NSTEMI (non-ST elevated myocardial infarction) (HCC) 01/28/2015  . Nausea without vomiting 12/22/2014  . Dysuria 12/21/2014  . Diarrhea 09/26/2014  . Difficulty swallowing pills 09/26/2014  . Hemorrhoids 06/16/2014  . Elevated AST (SGOT) 03/27/2014  . Lumbar herniated disc 03/27/2014  . TIA (transient ischemic attack) 02/19/2014  . History of ischemic left PCA stroke 02/19/2014  . Palpitations 01/27/2014  . Depression 08/15/2013  . Long term current use of anticoagulant therapy 08/10/2013  . Gastritis and duodenitis 08/03/2013  . Diverticulosis 08/03/2013  . Fatty liver 08/03/2013  . Esophagitis 08/03/2013  . Hiatal hernia 08/03/2013  . Apical mural thrombus 08/02/2013  . GI bleeding 06/04/2012  . Hypokalemia 06/04/2012  . Antithrombin III deficiency (HCC) 06/04/2012  . Paresthesias 06/29/2011  . Hyperlipemia 06/07/2009  . Essential hypertension 05/17/2009  . OBESITY-MORBID (>100') 03/13/2008  . CAD S/P  BMS to proximal LAD 2009-patent 01/29/15 06/28/2007    Keylie Beavers, Radene Journey MS, CCC-SLP 08/11/2017, 12:10 PM  [facility] [address] [place], [place], [address] Phone: [phone]   Fax:  [phone]   Name: TECORA ODOM MRN: [id number] Date of Birth: [birth year]

## 2017-08-11 NOTE — Patient Instructions (Signed)
  Keep a basket for phone/keys/glasses so they are in the same place   Tips to help facilitate better attention, concentration, focus   Do harder, longer tasks when you are most alert/awake  Break down larger tasks into small parts  Limit distractions of TV, radio, conversation, e mails/texts, appliance noise, etc - if a job is important, do it in a quiet room  Be aware of how you are functioning in high stimulation environments such as large stores, parties, restaurants - any place with lots of lights, noise, signs etc  Group conversations may be more difficult to process than one on one conversations  Give yourself extra time to process conversation, reading materials, directions or information from your healthcare providers  Organization is key - clutters of laundry, mail, paperwork, dirty dishes - all make it more difficult to concentrate  Before you start a task, have all the needed supplies, directions, recipes ready and organized. This way you don't have to go looking for something in the middle of a task and become distracted.   Be aware of fatigue - take rests or breaks when needed to re-group and re-focus  Hold a spoon in your lip out straight - don't use your teeth 60 seconds  Great job using the calendar and Building surveyor to manage your bills - Start doing some balancing even if it is mental and your are rounding   Play [employer]

## 2017-08-12 ENCOUNTER — Encounter: Payer: Self-pay | Admitting: Physical Therapy

## 2017-08-12 ENCOUNTER — Ambulatory Visit: Payer: Medicare Other | Admitting: Occupational Therapy

## 2017-08-12 ENCOUNTER — Encounter: Payer: Self-pay | Admitting: Occupational Therapy

## 2017-08-12 ENCOUNTER — Ambulatory Visit: Payer: Medicare Other | Admitting: Physical Therapy

## 2017-08-12 VITALS — BP 84/64 | HR 86

## 2017-08-12 DIAGNOSIS — R41840 Attention and concentration deficit: Secondary | ICD-10-CM | POA: Diagnosis not present

## 2017-08-12 DIAGNOSIS — I69352 Hemiplegia and hemiparesis following cerebral infarction affecting left dominant side: Secondary | ICD-10-CM | POA: Diagnosis not present

## 2017-08-12 DIAGNOSIS — R41842 Visuospatial deficit: Secondary | ICD-10-CM

## 2017-08-12 DIAGNOSIS — M6281 Muscle weakness (generalized): Secondary | ICD-10-CM

## 2017-08-12 DIAGNOSIS — R2681 Unsteadiness on feet: Secondary | ICD-10-CM

## 2017-08-12 DIAGNOSIS — I69315 Cognitive social or emotional deficit following cerebral infarction: Secondary | ICD-10-CM

## 2017-08-12 DIAGNOSIS — R261 Paralytic gait: Secondary | ICD-10-CM | POA: Diagnosis not present

## 2017-08-12 NOTE — Therapy (Signed)
[facility] [address] [place], [place], [address] Phone: [phone]   Fax:  [phone]  Occupational Therapy Treatment  Patient Details  Name: Lindsey [name] MRN: [id number] Date of Birth: 02/15/1971 Referring Provider: Claudette Laws, MD   Encounter Date: 08/12/2017  OT End of Session - 08/12/17 1630    Visit Number  3    Number of Visits  9    Authorization Type  Medicaid    OT Start Time  1532    OT Stop Time  1620    OT Time Calculation (min)  48 min    Activity Tolerance  Patient tolerated treatment well    Behavior During Therapy  [facility] for tasks assessed/performed       Past Medical History:  Diagnosis Date  . Anemia   . Antithrombin III deficiency (HCC)    ?pt not sure if true diagnosis  . Anxiety   . Apical mural thrombus   . Blood transfusion   . Cholecystitis 07/2016  . Coronary artery disease    a. apical LAD infarction '00. b. NSTEMI s/p BMS to prox LAD '09. c. Cath 01/2015: stable LAD stent, otherwise minimal nonobstructive CAD.  Marland Kitchen GERD (gastroesophageal reflux disease)   . Hyperlipidemia   . Hypertension   . Myocardial infarction (HCC)   . Noncompliance with medication regimen    a. h/o noncompliance with med regimen (previous running out of Coumadin).  . Stroke (HCC)    assoc with short term memory loss and right peripheral vision loss; age 48   . TIA (transient ischemic attack) 2010  . Tobacco abuse     Past Surgical History:  Procedure Laterality Date  . CARDIAC CATHETERIZATION    . CARDIAC CATHETERIZATION N/A 01/29/2015   Procedure: Left Heart Cath and Coronary Angiography;  Surgeon: Peter M Swaziland, MD;  Location: [facility] INVASIVE CV LAB;  Service: Cardiovascular;  Laterality: N/A;  . CHOLECYSTECTOMY N/A 08/14/2016   Procedure: LAPAROSCOPIC CHOLECYSTECTOMY;  Surgeon: Gaynelle Adu, MD;  [facility] OR;  Service: General;  Laterality: N/A;  . COLONOSCOPY N/A 03/29/2014   Procedure:  COLONOSCOPY;  Surgeon: Louis Meckel, MD;  Location: [facility] ENDOSCOPY;  Service: Endoscopy;  Laterality: N/A;  . CORONARY ANGIOPLASTY    . ESOPHAGOGASTRODUODENOSCOPY N/A 06/09/2012   Procedure: ESOPHAGOGASTRODUODENOSCOPY (EGD);  Surgeon: Theda Belfast, MD;  Location: [facility] ENDOSCOPY;  Service: Endoscopy;  Laterality: N/A;  . ESOPHAGOGASTRODUODENOSCOPY N/A 08/02/2013   Procedure: ESOPHAGOGASTRODUODENOSCOPY (EGD);  Surgeon: Meryl Dare, MD;  Location: [facility] ENDOSCOPY;  Service: Endoscopy;  Laterality: N/A;  . IR ANGIO INTRA EXTRACRAN SEL COM CAROTID INNOMINATE UNI R MOD SED  07/08/2017  . LEFT HEART CATHETERIZATION WITH CORONARY ANGIOGRAM N/A 12/24/2011   Procedure: LEFT HEART CATHETERIZATION WITH CORONARY ANGIOGRAM;  Surgeon: Kathleene Hazel, MD;  Location: [facility] CATH LAB;  Service: Cardiovascular;  Laterality: N/A;  . RADIOLOGY WITH ANESTHESIA N/A 07/08/2017   Procedure: IR WITH ANESTHESIA;  Surgeon: Radiologist, Medication, MD;  Location: [facility] OR;  Service: Radiology;  Laterality: N/A;  . TUBAL LIGATION      There were no vitals filed for this visit.  Subjective Assessment - 08/12/17 1537    Subjective   I am writing better, I am not dropping things as much    Pertinent History  R MCA CVA on 07/08/2017; visual deficits    Patient Stated Goals  I want to gain confidence to do things normally again    Currently in Pain?  No/denies  Pain Score  0-No pain                   OT Treatments/Exercises (OP) - 08/12/17 0001      ADLs   Cooking  Patient is still not cooking at home as she is afraid to leave the stove on.  She had to leave herself a post it note in bathroom to remind her to turn off the shower.  She is frequently forgetting keys, phone, etc.  Patient able to prepare a simple hot dish (scrambled eggs) without cueing for safety - using left hand as dominant.        Cognitive Exercises   Other Cognitive Exercises 1  MOCA:  21/30 - errors in visuospatial and attention.  Patient  had greatest difficulty with mental calculation for serial subtraction.            Balance Exercises - 08/12/17 1532      Balance Exercises: Standing   Other Standing Exercises  Braiding forwards and behind x 10 steps to L and R, 1 set with eyes open, 2 sets with eyes closed with supervision        OT Education - 08/12/17 1630    Education provided  Yes    Education Details  attentional deficits and compensations    Person(s) Educated  Patient    Methods  Explanation    Comprehension  Verbalized understanding;Need further instruction       OT Short Term Goals - 08/12/17 1543      OT SHORT TERM GOAL #1   Title  Patient will complete a home exercise program designed to improve strength in left hand with min prompting due 6/8    Status  Achieved        OT Long Term Goals - 08/12/17 1543      OT LONG TERM GOAL #4   Title  Patient will demonstrate an 8 lb imprvement in left hand strength    Status  Achieved            Plan - 08/12/17 1630    Clinical Impression Statement  Patient showing improved hand strength, but continues to demonstrate significant attentional impairments which impede ADL/IADL.      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  Has medical history significant for blood clotting disorder history of heart attacks and stroke.      Occupational performance deficits (Please refer to evaluation for details):  ADL's;IADL's;Work;Leisure;Social Participation    Rehab Potential  Good    OT Frequency  2x / week    OT Treatment/Interventions  Self-care/ADL training;Therapeutic exercise;Functional Mobility Training;Balance training;Neuromuscular education;Energy conservation;Therapeutic activities;Coping strategies training;Cognitive remediation/compensation;DME and/or AE instruction;Visual/perceptual  remediation/compensation;Patient/family education    Plan  Functional use of LUE, Functional cognition - attention- cooking - following steps of recipe    Clinical Decision Making  Several treatment options, min-mod task modification necessary    Consulted and Agree with Plan of Care  Patient       Patient will benefit from skilled therapeutic intervention in order to improve the following deficits and impairments:  Decreased endurance, Impaired vision/preception, Decreased coping skills, Impaired perceived functional ability, Decreased activity tolerance, Decreased strength, Decreased balance, Decreased cognition, Decreased coordination, Decreased safety awareness, Impaired UE functional use  Visit Diagnosis: Hemiplegia and hemiparesis following cerebral infarction affecting left dominant side (HCC)  Muscle weakness (generalized)  Attention  and concentration deficit  Cognitive social or emotional deficit following cerebral infarction  Visuospatial deficit    Problem List Patient Active Problem List   Diagnosis Date Noted  . Acute blood loss anemia   . Vascular headache   . Hemiparesis affecting left side as late effect of stroke (HCC)   . Tobacco use disorder 07/13/2017  . Noncompliance with medication regimen 07/13/2017  . Acute ischemic right MCA stroke (HCC) 07/13/2017  . Benign essential HTN   . Stroke (cerebrum) (HCC) 07/08/2017  . Middle cerebral artery embolism, right 07/08/2017  . Vitamin B12 deficiency 08/15/2016  . Acute cholecystitis 08/14/2016  . GERD (gastroesophageal reflux disease) 04/24/2015  . Hypertensive urgency 04/23/2015  . Chest pain, unspecified   . Right leg swelling 03/07/2015  . Preventative health care 02/21/2015  . Esophageal reflux   . NSTEMI (non-ST elevated myocardial infarction) (HCC) 01/28/2015  . Nausea without vomiting 12/22/2014  . Dysuria 12/21/2014  . Diarrhea 09/26/2014  . Difficulty swallowing pills 09/26/2014  . Hemorrhoids  06/16/2014  . Elevated AST (SGOT) 03/27/2014  . Lumbar herniated disc 03/27/2014  . TIA (transient ischemic attack) 02/19/2014  . History of ischemic left PCA stroke 02/19/2014  . Palpitations 01/27/2014  . Depression 08/15/2013  . Long term current use of anticoagulant therapy 08/10/2013  . Gastritis and duodenitis 08/03/2013  . Diverticulosis 08/03/2013  . Fatty liver 08/03/2013  . Esophagitis 08/03/2013  . Hiatal hernia 08/03/2013  . Apical mural thrombus 08/02/2013  . GI bleeding 06/04/2012  . Hypokalemia 06/04/2012  . Antithrombin III deficiency (HCC) 06/04/2012  . Paresthesias 06/29/2011  . Hyperlipemia 06/07/2009  . Essential hypertension 05/17/2009  . OBESITY-MORBID (>100') 03/13/2008  . CAD S/P  BMS to proximal LAD 2009-patent 01/29/15 06/28/2007    Collier Salina, OTR/L 08/12/2017, 4:33 PM  [facility] [address] [place], [place], [address] Phone: [phone]   Fax:  [phone]  Name: Lindsey [name] MRN: [id number] Date of Birth: April 19, 1970

## 2017-08-12 NOTE — Therapy (Signed)
[facility] [address] [place], [place], [address] Phone: [phone]   Fax:  [phone]  Physical Therapy Treatment  Patient Details  Name: Lindsey [name] MRN: [id number] Date of Birth: 01-28-1971 Referring Provider: Claudette Laws, MD   Encounter Date: 08/12/2017  PT End of Session - 08/12/17 2110    Visit Number  3    Number of Visits  7    Authorization Type  Medicaid    Authorization Time Period  5/20 - 6/9    PT Start Time  1452    PT Stop Time  1533    PT Time Calculation (min)  41 min    Activity Tolerance  Patient tolerated treatment well    Behavior During Therapy  [place] for tasks assessed/performed       Past Medical History:  Diagnosis Date  . Anemia   . Antithrombin III deficiency (HCC)    ?pt not sure if true diagnosis  . Anxiety   . Apical mural thrombus   . Blood transfusion   . Cholecystitis 07/2016  . Coronary artery disease    a. apical LAD infarction '00. b. NSTEMI s/p BMS to prox LAD '09. c. Cath 01/2015: stable LAD stent, otherwise minimal nonobstructive CAD.  Marland Kitchen GERD (gastroesophageal reflux disease)   . Hyperlipidemia   . Hypertension   . Myocardial infarction (HCC)   . Noncompliance with medication regimen    a. h/o noncompliance with med regimen (previous running out of Coumadin).  . Stroke (HCC)    assoc with short term memory loss and right peripheral vision loss; age 11   . TIA (transient ischemic attack) 2010  . Tobacco abuse     Past Surgical History:  Procedure Laterality Date  . CARDIAC CATHETERIZATION    . CARDIAC CATHETERIZATION N/A 01/29/2015   Procedure: Left Heart Cath and Coronary Angiography;  Surgeon: Peter M Swaziland, MD;  Location: [facility] INVASIVE CV LAB;  Service: Cardiovascular;  Laterality: N/A;  . CHOLECYSTECTOMY N/A 08/14/2016   Procedure: LAPAROSCOPIC CHOLECYSTECTOMY;  Surgeon: Gaynelle Adu, MD;  Location: [facility] OR;  Service: General;  Laterality: N/A;  .  COLONOSCOPY N/A 03/29/2014   Procedure: COLONOSCOPY;  Surgeon: Louis Meckel, MD;  Location: [facility] ENDOSCOPY;  Service: Endoscopy;  Laterality: N/A;  . CORONARY ANGIOPLASTY    . ESOPHAGOGASTRODUODENOSCOPY N/A 06/09/2012   Procedure: ESOPHAGOGASTRODUODENOSCOPY (EGD);  Surgeon: Theda Belfast, MD;  Location: [facility] ENDOSCOPY;  Service: Endoscopy;  Laterality: N/A;  . ESOPHAGOGASTRODUODENOSCOPY N/A 08/02/2013   Procedure: ESOPHAGOGASTRODUODENOSCOPY (EGD);  Surgeon: Meryl Dare, MD;  Location: [facility] ENDOSCOPY;  Service: Endoscopy;  Laterality: N/A;  . IR ANGIO INTRA EXTRACRAN SEL COM CAROTID INNOMINATE UNI R MOD SED  07/08/2017  . LEFT HEART CATHETERIZATION WITH CORONARY ANGIOGRAM N/A 12/24/2011   Procedure: LEFT HEART CATHETERIZATION WITH CORONARY ANGIOGRAM;  Surgeon: Kathleene Hazel, MD;  Location: [facility] CATH LAB;  Service: Cardiovascular;  Laterality: N/A;  . RADIOLOGY WITH ANESTHESIA N/A 07/08/2017   Procedure: IR WITH ANESTHESIA;  Surgeon: Radiologist, Medication, MD;  Location: [facility] OR;  Service: Radiology;  Laterality: N/A;  . TUBAL LIGATION      Vitals:   08/12/17 1502 08/12/17 1503 08/12/17 1507  BP: 92/68 (!) 85/62 (!) 84/64  Pulse: 80 87 86    Subjective Assessment - 08/12/17 1457    Subjective  No issues with HEP; stretches feel good for legs.  When she is standing she feels her lower legs get very weak and fatigued.    Pertinent  History  R CVA 07/08/17 & hx of ischemic left PCA stroke, HTN, NSTEMI, CAD, MI, TIA,    Limitations  Lifting;Walking;House hold activities;Standing    Patient Stated Goals  walking, stairs, getting in/out shower, no falls,     Currently in Pain?  No/denies         [facility] Adult PT Treatment/Exercise - 08/12/17 1512      Exercises   Exercises  Knee/Hip      Knee/Hip Exercises: Aerobic   Nustep  L6 resistance with bilat UE and LE for endurance and strengthening x 3 minutes and then had to turn resistance down to level 5 x 5 more minutes; total of 8 minutes of  endurance training.  Following endurance BP increased to 104/69, HR: 82      Knee/Hip Exercises: Standing   Lateral Step Up  Right;Left;1 set;10 reps;Hand Hold: 0;Step Height: 4"    Forward Step Up  Right;Left;1 set;10 reps;Hand Hold: 0;Step Height: 4"          Balance Exercises - 08/12/17 1532      Balance Exercises: Standing   Other Standing Exercises  Braiding forwards and behind x 10 steps to L and R, 1 set with eyes open, 2 sets with eyes closed with supervision        PT Education - 08/12/17 2109    Education provided  Yes    Education Details  use BP log to determine if orthostasis is occuring each day and may be contributing to fatigue and lightheadedness; pt to discuss with PCP if BP continues to run low    Person(s) Educated  Patient    Methods  Explanation;Demonstration;Handout    Comprehension  Verbalized understanding       PT Short Term Goals - 07/22/17 2309      PT SHORT TERM GOAL #1   Title  Patient demonstrates understanding of initial HEP. (All STGs Target Date 4th visit)    Baseline  Patient is dependent in proper exercises with CVA.     Time  3    Period  Weeks    Status  New      PT SHORT TERM GOAL #2   Title  Patient ambulates 500' outdoors including ramps & curbs without device with supervision.    Baseline  Patient ambulates 300' indoor surfaces with gait deviations and balance issues with scanning & negotiating obstacles.     Time  3    Period  Weeks    Status  New      PT SHORT TERM GOAL #3   Title  Patient is able to ambulate while performing simple cognitive task without balance loss.     Baseline  Patient has balance deficits & loss when ambulating while performing simple cognitive tasks.     Time  3    Period  Weeks    Status  New        PT Long Term Goals - 07/22/17 2316      PT LONG TERM GOAL #1   Title  Patient verbalizes & demonstrates ongoing HEP/ fitness plan. (All LTGs Target Date: 7th visit)    Baseline  Patient is  dependent in proper exercises with CVA.    Time  6    Period  Weeks    Status  New      PT LONG TERM GOAL #2   Title  Berg Balance </= 54/56    Baseline  Berg Balance 51/56    Time  6  Period  Weeks    Status  New      PT LONG TERM GOAL #3   Title  Cognitive (simple naming task A-Z) Timed Up-Go <15.5 seconds     Baseline  Cognitive TUG 21.22seconds    Time  6    Period  Weeks    Status  New      PT LONG TERM GOAL #4   Title  Functional Gait Assessment >/= 19/30    Baseline  Functional Gait Assessment 9/30    Time  6    Period  Weeks    Status  New      PT LONG TERM GOAL #5   Title  Patient ambulates 1000' outdoors including grass, ramps, curbs & stairs single rail without device modified independent.     Baseline  Patient ambulates 300' indoors with close supervision & gait deviations placing patient at high fall risk.     Time  6    Period  Weeks    Status  New            Plan - 08/12/17 2111    Clinical Impression Statement  Treatment session today focused on assessment of vitals and education on monitoring BP at home due to new onset of lightheadedness and fatigue over the past few days; if BP continues to run low with symptoms, pt to discuss with PCP.  Performed UE and LE endurance and strength training and continued proximal hip strengthening for safer stair negotiation.  Pt tolerated well; will assess STG next session and will continue to progress towards LTG.    Rehab Potential  Good    PT Frequency  1x / week    PT Duration  6 weeks    PT Treatment/Interventions  ADLs/Self Care Home Management;Gait training;Stair training;Functional mobility training;Therapeutic activities;Therapeutic exercise;Balance training;Neuromuscular re-education;Patient/family education;Canalith Repostioning;Vestibular    PT Next Visit Plan  How has her BP been, still low?? CHECK STG! gait on outdoor surfaces/compliant surfaces, dynamic gait activities, dual tasking with gait.      Consulted and Agree with Plan of Care  Patient       Patient will benefit from skilled therapeutic intervention in order to improve the following deficits and impairments:  Abnormal gait, Decreased activity tolerance, Decreased balance, Decreased coordination, Decreased endurance, Decreased mobility, Decreased strength  Visit Diagnosis: Hemiplegia and hemiparesis following cerebral infarction affecting left dominant side (HCC)  Muscle weakness (generalized)  Unsteadiness on feet     Problem List Patient Active Problem List   Diagnosis Date Noted  . Acute blood loss anemia   . Vascular headache   . Hemiparesis affecting left side as late effect of stroke (HCC)   . Tobacco use disorder 07/13/2017  . Noncompliance with medication regimen 07/13/2017  . Acute ischemic right MCA stroke (HCC) 07/13/2017  . Benign essential HTN   . Stroke (cerebrum) (HCC) 07/08/2017  . Middle cerebral artery embolism, right 07/08/2017  . Vitamin B12 deficiency 08/15/2016  . Acute cholecystitis 08/14/2016  . GERD (gastroesophageal reflux disease) 04/24/2015  . Hypertensive urgency 04/23/2015  . Chest pain, unspecified   . Right leg swelling 03/07/2015  . Preventative health care 02/21/2015  . Esophageal reflux   . NSTEMI (non-ST elevated myocardial infarction) (HCC) 01/28/2015  . Nausea without vomiting 12/22/2014  . Dysuria 12/21/2014  . Diarrhea 09/26/2014  . Difficulty swallowing pills 09/26/2014  . Hemorrhoids 06/16/2014  . Elevated AST (SGOT) 03/27/2014  . Lumbar herniated disc 03/27/2014  . TIA (transient ischemic attack) 02/19/2014  .  History of ischemic left PCA stroke 02/19/2014  . Palpitations 01/27/2014  . Depression 08/15/2013  . Long term current use of anticoagulant therapy 08/10/2013  . Gastritis and duodenitis 08/03/2013  . Diverticulosis 08/03/2013  . Fatty liver 08/03/2013  . Esophagitis 08/03/2013  . Hiatal hernia 08/03/2013  . Apical mural thrombus 08/02/2013  . GI  bleeding 06/04/2012  . Hypokalemia 06/04/2012  . Antithrombin III deficiency (HCC) 06/04/2012  . Paresthesias 06/29/2011  . Hyperlipemia 06/07/2009  . Essential hypertension 05/17/2009  . OBESITY-MORBID (>100') 03/13/2008  . CAD S/P  BMS to proximal LAD 2009-patent 01/29/15 06/28/2007    Dierdre Highman, PT, DPT 08/12/17    9:16 PM     [facility] [address] [place], [place], [address] Phone: [phone]   Fax:  [phone]  Name: TIFINI REEDER MRN: [id number] Date of Birth: 1970/11/16

## 2017-08-12 NOTE — Patient Instructions (Addendum)
Blood Pressure Record Sheet Your blood pressure on this visit to the emergency department or clinic is elevated. This does not necessarily mean you have high blood pressure (hypertension), but it does mean that your blood pressure needs to be rechecked. Many times your blood pressure can increase due to illness, pain, anxiety, or other factors. We recommend that you get a series of blood pressure readings done over a period of 5 days. It is best to get a reading in the morning and one in the evening. You should make sure to sit and relax for 1-5 minutes before the reading is taken. Write the readings down and make a follow-up appointment with your health care provider to discuss the results. If there is not a free clinic or a drug store with a blood-pressure-taking machine near you, you can purchase blood-pressure-taking equipment from a drug store. Having one in the home allows you the convenience of taking your blood pressure while you are home and relaxed. Blood Pressure Log Date: __5/29/19_________________  a.m. ____________________  p.m. ___84/64; HR:87_______  Date: _______________________  a.m. _____________________  p.m. _____________________  Date: _______________________  a.m. _____________________  p.m. _____________________  Date: _______________________  a.m. _____________________  p.m. _____________________  Date: _______________________  a.m. _____________________  p.m. _____________________  This information is not intended to replace advice given to you by your health care provider. Make sure you discuss any questions you have with your health care provider. Document Released: 11/30/2002 Document Revised: 02/15/2016 Document Reviewed: 04/26/2013 Elsevier Interactive Patient Education  2018 [employer].

## 2017-08-13 ENCOUNTER — Ambulatory Visit: Payer: Medicare Other | Admitting: Speech Pathology

## 2017-08-13 ENCOUNTER — Encounter: Payer: Self-pay | Admitting: Speech Pathology

## 2017-08-13 DIAGNOSIS — R471 Dysarthria and anarthria: Secondary | ICD-10-CM

## 2017-08-13 DIAGNOSIS — R261 Paralytic gait: Secondary | ICD-10-CM | POA: Diagnosis not present

## 2017-08-13 DIAGNOSIS — R41841 Cognitive communication deficit: Secondary | ICD-10-CM

## 2017-08-13 DIAGNOSIS — I69315 Cognitive social or emotional deficit following cerebral infarction: Secondary | ICD-10-CM | POA: Diagnosis not present

## 2017-08-13 DIAGNOSIS — M6281 Muscle weakness (generalized): Secondary | ICD-10-CM | POA: Diagnosis not present

## 2017-08-13 DIAGNOSIS — R2681 Unsteadiness on feet: Secondary | ICD-10-CM | POA: Diagnosis not present

## 2017-08-13 DIAGNOSIS — I69352 Hemiplegia and hemiparesis following cerebral infarction affecting left dominant side: Secondary | ICD-10-CM | POA: Diagnosis not present

## 2017-08-13 DIAGNOSIS — R41840 Attention and concentration deficit: Secondary | ICD-10-CM | POA: Diagnosis not present

## 2017-08-13 NOTE — Therapy (Signed)
[facility] [address] [place], [place], [address] Phone: [phone]   Fax:  [phone]  Speech Language Pathology Treatment  Patient Details  Name: Lindsey [name] MRN: [id number] Date of Birth: 1971/03/09 Referring Provider: Dr. Claudette Laws   Encounter Date: 08/13/2017  End of Session - 08/13/17 1203    Visit Number  3    Number of Visits  9    Date for SLP Re-Evaluation  10/20/17    Authorization Type  - 8 visits through 09/14/17    Authorization - Visit Number  3    Authorization - Number of Visits  8    SLP Start Time  1016    SLP Stop Time   1100    SLP Time Calculation (min)  44 min    Activity Tolerance  Patient tolerated treatment well       Past Medical History:  Diagnosis Date  . Anemia   . Antithrombin III deficiency (HCC)    ?pt not sure if true diagnosis  . Anxiety   . Apical mural thrombus   . Blood transfusion   . Cholecystitis 07/2016  . Coronary artery disease    a. apical LAD infarction '00. b. NSTEMI s/p BMS to prox LAD '09. c. Cath 01/2015: stable LAD stent, otherwise minimal nonobstructive CAD.  Marland Kitchen GERD (gastroesophageal reflux disease)   . Hyperlipidemia   . Hypertension   . Myocardial infarction (HCC)   . Noncompliance with medication regimen    a. h/o noncompliance with med regimen (previous running out of Coumadin).  . Stroke (HCC)    assoc with short term memory loss and right peripheral vision loss; age 13   . TIA (transient ischemic attack) 2010  . Tobacco abuse     Past Surgical History:  Procedure Laterality Date  . CARDIAC CATHETERIZATION    . CARDIAC CATHETERIZATION N/A 01/29/2015   Procedure: Left Heart Cath and Coronary Angiography;  Surgeon: Peter M Swaziland, MD;  Location: [facility] INVASIVE CV LAB;  Service: Cardiovascular;  Laterality: N/A;  . CHOLECYSTECTOMY N/A 08/14/2016   Procedure: LAPAROSCOPIC CHOLECYSTECTOMY;  Surgeon: Gaynelle Adu, MD;  Location: [facility] OR;   Service: General;  Laterality: N/A;  . COLONOSCOPY N/A 03/29/2014   Procedure: COLONOSCOPY;  Surgeon: Louis Meckel, MD;  Location: [facility] ENDOSCOPY;  Service: Endoscopy;  Laterality: N/A;  . CORONARY ANGIOPLASTY    . ESOPHAGOGASTRODUODENOSCOPY N/A 06/09/2012   Procedure: ESOPHAGOGASTRODUODENOSCOPY (EGD);  Surgeon: Theda Belfast, MD;  Location: [facility] ENDOSCOPY;  Service: Endoscopy;  Laterality: N/A;  . ESOPHAGOGASTRODUODENOSCOPY N/A 08/02/2013   Procedure: ESOPHAGOGASTRODUODENOSCOPY (EGD);  Surgeon: Meryl Dare, MD;  Location: [facility] ENDOSCOPY;  Service: Endoscopy;  Laterality: N/A;  . IR ANGIO INTRA EXTRACRAN SEL COM CAROTID INNOMINATE UNI R MOD SED  07/08/2017  . LEFT HEART CATHETERIZATION WITH CORONARY ANGIOGRAM N/A 12/24/2011   Procedure: LEFT HEART CATHETERIZATION WITH CORONARY ANGIOGRAM;  Surgeon: Kathleene Hazel, MD;  Location: [facility] CATH LAB;  Service: Cardiovascular;  Laterality: N/A;  . RADIOLOGY WITH ANESTHESIA N/A 07/08/2017   Procedure: IR WITH ANESTHESIA;  Surgeon: Radiologist, Medication, MD;  Location: [facility] OR;  Service: Radiology;  Laterality: N/A;  . TUBAL LIGATION      There were no vitals filed for this visit.  Subjective Assessment - 08/13/17 1019    Subjective  "I went over the homework, but I didn't write it in, so I don't want you to look at it"    Currently in Pain?  No/denies  ADULT SLP TREATMENT - 08/13/17 1021      General Information   Behavior/Cognition  Alert;Cooperative;Pleasant mood      Treatment Provided   Treatment provided  Cognitive-Linquistic      Cognitive-Linquistic Treatment   Treatment focused on  Cognition    Skilled Treatment  Facilitated alternating attention between deduction puzzle and simple conversation with usual mod A for organiziation and reasoning. Pt required min A to return to task after conversation. Extended time required. Pt required usual min to mod A for organization task and error awareness      Assessment /  Recommendations / Plan   Plan  Continue with current plan of care      Progression Toward Goals   Progression toward goals  Progressing toward goals         SLP Short Term Goals - 08/13/17 1203      SLP SHORT TERM GOAL #1   Title  Pt will complete HEP for dysarthria with rare min A.    Baseline  Pt is not completing exercises for dysarthria    Time  3    Period  Weeks    Status  On-going      SLP SHORT TERM GOAL #2   Title  Pt will solve functional money problems with rare min A.,    Baseline  Pt could not solve simple money problems on eval     Time  3    Period  Weeks    Status  On-going      SLP SHORT TERM GOAL #3   Title  Pt will alternate attention between 2 simple cognitive lingusitic tasks with 85% on each and rare min A    Baseline  Pt attention level is selective at eval    Time  3    Period  Weeks    Status  On-going      SLP SHORT TERM GOAL #4   Title  Pt will use external aids for daily recall and schedule management    Baseline  Pt is not uisng external aids to help her remember or attend    Time  3    Period  Weeks    Status  On-going       SLP Long Term Goals - 08/13/17 1203      SLP LONG TERM GOAL #1   Title  Pt will balance mildly complex money account with rare min A    Baseline  Pt unable to do simple check balancing at eval    Time  6    Period  Weeks    Status  On-going      SLP LONG TERM GOAL #2   Title  Pt will use external aids/system to manage her home financial management over 2 sessions     Baseline  Her mother is currently managing her finances    Time  6    Period  Weeks    Status  On-going      SLP LONG TERM GOAL #3   Title  Pt will alternate attention between 2 mildly complex cognitive linguistic tasks with 85% on each and occasional min A    Baseline  Attention level is selective at eval    Time  6    Period  Weeks    Status  On-going       Plan - 08/13/17 1200    Clinical Impression Statement  Pt reports carryover of  strategies for bill management and use  of external memory aids. She continues to require min to mod A and extended time for time/money math, alternating attention, awareness. Continue skilled ST to maximize cognition for possible return to work.    Speech Therapy Frequency  2x / week 2x a week for 3 weeks, 1x a week for 2 weeks    Duration  -- 6 weeks    Treatment/Interventions  SLP instruction and feedback;Cueing hierarchy;Oral motor exercises;Cognitive reorganization;Functional tasks;Compensatory strategies;Patient/family education;Internal/external aids;Environmental controls    Potential to Achieve Goals  Good       Patient will benefit from skilled therapeutic intervention in order to improve the following deficits and impairments:   Cognitive communication deficit  Dysarthria and anarthria    Problem List Patient Active Problem List   Diagnosis Date Noted  . Acute blood loss anemia   . Vascular headache   . Hemiparesis affecting left side as late effect of stroke (HCC)   . Tobacco use disorder 07/13/2017  . Noncompliance with medication regimen 07/13/2017  . Acute ischemic right MCA stroke (HCC) 07/13/2017  . Benign essential HTN   . Stroke (cerebrum) (HCC) 07/08/2017  . Middle cerebral artery embolism, right 07/08/2017  . Vitamin B12 deficiency 08/15/2016  . Acute cholecystitis 08/14/2016  . GERD (gastroesophageal reflux disease) 04/24/2015  . Hypertensive urgency 04/23/2015  . Chest pain, unspecified   . Right leg swelling 03/07/2015  . Preventative health care 02/21/2015  . Esophageal reflux   . NSTEMI (non-ST elevated myocardial infarction) (HCC) 01/28/2015  . Nausea without vomiting 12/22/2014  . Dysuria 12/21/2014  . Diarrhea 09/26/2014  . Difficulty swallowing pills 09/26/2014  . Hemorrhoids 06/16/2014  . Elevated AST (SGOT) 03/27/2014  . Lumbar herniated disc 03/27/2014  . TIA (transient ischemic attack) 02/19/2014  . History of ischemic left PCA stroke  02/19/2014  . Palpitations 01/27/2014  . Depression 08/15/2013  . Long term current use of anticoagulant therapy 08/10/2013  . Gastritis and duodenitis 08/03/2013  . Diverticulosis 08/03/2013  . Fatty liver 08/03/2013  . Esophagitis 08/03/2013  . Hiatal hernia 08/03/2013  . Apical mural thrombus 08/02/2013  . GI bleeding 06/04/2012  . Hypokalemia 06/04/2012  . Antithrombin III deficiency (HCC) 06/04/2012  . Paresthesias 06/29/2011  . Hyperlipemia 06/07/2009  . Essential hypertension 05/17/2009  . OBESITY-MORBID (>100') 03/13/2008  . CAD S/P  BMS to proximal LAD 2009-patent 01/29/15 06/28/2007    Keevon Henney, Radene Journey MS, CCC-SLP 08/13/2017, 12:06 PM   [facility] [address] [place], [place], [address] Phone: [phone]   Fax:  [phone]   Name: Lindsey [name] MRN: [id number] Date of Birth: 1970-05-04

## 2017-08-14 ENCOUNTER — Encounter: Payer: Self-pay | Admitting: Occupational Therapy

## 2017-08-14 ENCOUNTER — Ambulatory Visit: Payer: Medicare Other | Admitting: Occupational Therapy

## 2017-08-14 DIAGNOSIS — R41840 Attention and concentration deficit: Secondary | ICD-10-CM | POA: Diagnosis not present

## 2017-08-14 DIAGNOSIS — I69315 Cognitive social or emotional deficit following cerebral infarction: Secondary | ICD-10-CM

## 2017-08-14 DIAGNOSIS — M6281 Muscle weakness (generalized): Secondary | ICD-10-CM | POA: Diagnosis not present

## 2017-08-14 DIAGNOSIS — I69352 Hemiplegia and hemiparesis following cerebral infarction affecting left dominant side: Secondary | ICD-10-CM

## 2017-08-14 DIAGNOSIS — R261 Paralytic gait: Secondary | ICD-10-CM | POA: Diagnosis not present

## 2017-08-14 DIAGNOSIS — R2681 Unsteadiness on feet: Secondary | ICD-10-CM

## 2017-08-14 DIAGNOSIS — R41842 Visuospatial deficit: Secondary | ICD-10-CM

## 2017-08-14 NOTE — Therapy (Signed)
[facility] [address] [place], [place], [address] Phone: [phone]   Fax:  [phone]  Occupational Therapy Treatment  Patient Details  Name: Lindsey [name] MRN: [id number] Date of Birth: 1971-02-18 Referring Provider: Claudette Laws, MD   Encounter Date: 08/14/2017  OT End of Session - 08/14/17 1629    Visit Number  4    Number of Visits  9    Authorization Type  Medicaid    OT Start Time  1532    OT Stop Time  1615    OT Time Calculation (min)  43 min    Activity Tolerance  Patient tolerated treatment well       Past Medical History:  Diagnosis Date  . Anemia   . Antithrombin III deficiency (HCC)    ?pt not sure if true diagnosis  . Anxiety   . Apical mural thrombus   . Blood transfusion   . Cholecystitis 07/2016  . Coronary artery disease    a. apical LAD infarction '00. b. NSTEMI s/p BMS to prox LAD '09. c. Cath 01/2015: stable LAD stent, otherwise minimal nonobstructive CAD.  Marland Kitchen GERD (gastroesophageal reflux disease)   . Hyperlipidemia   . Hypertension   . Myocardial infarction (HCC)   . Noncompliance with medication regimen    a. h/o noncompliance with med regimen (previous running out of Coumadin).  . Stroke (HCC)    assoc with short term memory loss and right peripheral vision loss; age 5   . TIA (transient ischemic attack) 2010  . Tobacco abuse     Past Surgical History:  Procedure Laterality Date  . CARDIAC CATHETERIZATION    . CARDIAC CATHETERIZATION N/A 01/29/2015   Procedure: Left Heart Cath and Coronary Angiography;  Surgeon: Peter M Swaziland, MD;  Location: [facility] INVASIVE CV LAB;  Service: Cardiovascular;  Laterality: N/A;  . CHOLECYSTECTOMY N/A 08/14/2016   Procedure: LAPAROSCOPIC CHOLECYSTECTOMY;  Surgeon: Gaynelle Adu, MD;  Location: [facility] OR;  Service: General;  Laterality: N/A;  . COLONOSCOPY N/A 03/29/2014   Procedure: COLONOSCOPY;  Surgeon: Louis Meckel, MD;  Location: [facility]  ENDOSCOPY;  Service: Endoscopy;  Laterality: N/A;  . CORONARY ANGIOPLASTY    . ESOPHAGOGASTRODUODENOSCOPY N/A 06/09/2012   Procedure: ESOPHAGOGASTRODUODENOSCOPY (EGD);  Surgeon: Theda Belfast, MD;  Location: [facility] ENDOSCOPY;  Service: Endoscopy;  Laterality: N/A;  . ESOPHAGOGASTRODUODENOSCOPY N/A 08/02/2013   Procedure: ESOPHAGOGASTRODUODENOSCOPY (EGD);  Surgeon: Meryl Dare, MD;  Location: [facility] ENDOSCOPY;  Service: Endoscopy;  Laterality: N/A;  . IR ANGIO INTRA EXTRACRAN SEL COM CAROTID INNOMINATE UNI R MOD SED  07/08/2017  . LEFT HEART CATHETERIZATION WITH CORONARY ANGIOGRAM N/A 12/24/2011   Procedure: LEFT HEART CATHETERIZATION WITH CORONARY ANGIOGRAM;  Surgeon: Kathleene Hazel, MD;  Location: [facility] CATH LAB;  Service: Cardiovascular;  Laterality: N/A;  . RADIOLOGY WITH ANESTHESIA N/A 07/08/2017   Procedure: IR WITH ANESTHESIA;  Surgeon: Radiologist, Medication, MD;  Location: [facility] OR;  Service: Radiology;  Laterality: N/A;  . TUBAL LIGATION      There were no vitals filed for this visit.  Subjective Assessment - 08/14/17 1627    Subjective   I made breakfast for my family this morning    Pertinent History  R MCA CVA on 07/08/2017; visual deficits    Patient Stated Goals  I want to gain confidence to do things normally again    Currently in Pain?  No/denies    Pain Score  0-No pain  OT Treatments/Exercises (OP) - 08/14/17 0001      ADLs   Cooking  Patient was able to cook on the stove top this am with her mom providing supervision.      Writing  Patient's handwriting 100% legible at this point.        Exercises   Exercises  Hand      Hand Exercises   Other Hand Exercises  Handwriting, and grooved pegboard.                 OT Short Term Goals - 08/14/17 1537      OT SHORT TERM GOAL #1   Title  Patient will complete a home exercise program designed to improve strength in left hand with min prompting due 6/8    Status  Achieved      OT  SHORT TERM GOAL #2   Title  Patient will complete a home activities program designed to increase her use of left hand as dominant 50% time with min prompting    Baseline  Patient is not spontaneoulsly using left hand as dominant    Time  3    Period  Weeks    Status  Achieved      OT SHORT TERM GOAL #3   Title  Patient will utilize stove top to effectively to cook one item with supervision    Baseline  Patient is not cooking using stove or oven    Time  3    Status  Achieved        OT Long Term Goals - 08/14/17 1630      OT LONG TERM GOAL #1   Title  Patient will complete a home exercise program for LUE strengthening independently (due 6/23 - see Mcaid approval end date)    Status  On-going      OT LONG TERM GOAL #2   Title  Patient will utilize left hand as dominant 75% time during ADL/IADL without prompting    Status  On-going      OT LONG TERM GOAL #3   Title  Patient will utilize a medication management system to help her comply with daily medications with supervision    Status  Achieved      OT LONG TERM GOAL #4   Title  Patient will demonstrate an 8 lb imprvement in left hand strength    Status  Achieved      OT LONG TERM GOAL #5   Title  Patent will independently complete hair care and doffing bra in reasonable timeframe.      Status  On-going            Plan - 08/14/17 1629    Clinical Impression Statement  Patient showing improved hand strength, but continues to demonstrate significant attentional impairments which impede IADL.      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  Has medical history significant for blood clotting disorder history of heart attacks and stroke.      Occupational performance deficits (Please refer to evaluation for details):  ADL's;IADL's;Work;Leisure;Social Participation    Rehab Potential   Good    OT Frequency  2x / week    OT Treatment/Interventions  Self-care/ADL training;Therapeutic exercise;Functional Mobility Training;Balance training;Neuromuscular education;Energy conservation;Therapeutic activities;Coping strategies training;Cognitive remediation/compensation;DME and/or AE instruction;Visual/perceptual remediation/compensation;Patient/family education    Plan  functional cognition - following steps of recipe  Clinical Decision Making  Several treatment options, min-mod task modification necessary    Consulted and Agree with Plan of Care  Patient       Patient will benefit from skilled therapeutic intervention in order to improve the following deficits and impairments:  Decreased endurance, Impaired vision/preception, Decreased coping skills, Impaired perceived functional ability, Decreased activity tolerance, Decreased strength, Decreased balance, Decreased cognition, Decreased coordination, Decreased safety awareness, Impaired UE functional use  Visit Diagnosis: Hemiplegia and hemiparesis following cerebral infarction affecting left dominant side (HCC)  Muscle weakness (generalized)  Attention and concentration deficit  Cognitive social or emotional deficit following cerebral infarction  Visuospatial deficit  Unsteadiness on feet    Problem List Patient Active Problem List   Diagnosis Date Noted  . Acute blood loss anemia   . Vascular headache   . Hemiparesis affecting left side as late effect of stroke (HCC)   . Tobacco use disorder 07/13/2017  . Noncompliance with medication regimen 07/13/2017  . Acute ischemic right MCA stroke (HCC) 07/13/2017  . Benign essential HTN   . Stroke (cerebrum) (HCC) 07/08/2017  . Middle cerebral artery embolism, right 07/08/2017  . Vitamin B12 deficiency 08/15/2016  . Acute cholecystitis 08/14/2016  . GERD (gastroesophageal reflux disease) 04/24/2015  . Hypertensive urgency 04/23/2015  . Chest pain, unspecified   .  Right leg swelling 03/07/2015  . Preventative health care 02/21/2015  . Esophageal reflux   . NSTEMI (non-ST elevated myocardial infarction) (HCC) 01/28/2015  . Nausea without vomiting 12/22/2014  . Dysuria 12/21/2014  . Diarrhea 09/26/2014  . Difficulty swallowing pills 09/26/2014  . Hemorrhoids 06/16/2014  . Elevated AST (SGOT) 03/27/2014  . Lumbar herniated disc 03/27/2014  . TIA (transient ischemic attack) 02/19/2014  . History of ischemic left PCA stroke 02/19/2014  . Palpitations 01/27/2014  . Depression 08/15/2013  . Long term current use of anticoagulant therapy 08/10/2013  . Gastritis and duodenitis 08/03/2013  . Diverticulosis 08/03/2013  . Fatty liver 08/03/2013  . Esophagitis 08/03/2013  . Hiatal hernia 08/03/2013  . Apical mural thrombus 08/02/2013  . GI bleeding 06/04/2012  . Hypokalemia 06/04/2012  . Antithrombin III deficiency (HCC) 06/04/2012  . Paresthesias 06/29/2011  . Hyperlipemia 06/07/2009  . Essential hypertension 05/17/2009  . OBESITY-MORBID (>100') 03/13/2008  . CAD S/P  BMS to proximal LAD 2009-patent 01/29/15 06/28/2007    Collier Salina, OTR/L 08/14/2017, 4:34 PM  [facility] [address] [place], [place], [address] Phone: [phone]   Fax:  [phone]  Name: METTIE ROYLANCE MRN: [id number] Date of Birth: 1970/09/21

## 2017-08-15 DIAGNOSIS — I639 Cerebral infarction, unspecified: Secondary | ICD-10-CM

## 2017-08-15 HISTORY — DX: Cerebral infarction, unspecified: I63.9

## 2017-08-17 ENCOUNTER — Encounter: Payer: Self-pay | Admitting: Physical Therapy

## 2017-08-17 ENCOUNTER — Ambulatory Visit: Payer: Medicare Other | Admitting: Occupational Therapy

## 2017-08-17 ENCOUNTER — Encounter: Payer: Self-pay | Admitting: Occupational Therapy

## 2017-08-17 ENCOUNTER — Ambulatory Visit: Payer: Medicare Other | Attending: Physical Medicine and Rehabilitation | Admitting: Physical Therapy

## 2017-08-17 VITALS — BP 93/68 | HR 67

## 2017-08-17 VITALS — BP 93/72

## 2017-08-17 DIAGNOSIS — R41842 Visuospatial deficit: Secondary | ICD-10-CM | POA: Diagnosis not present

## 2017-08-17 DIAGNOSIS — M6281 Muscle weakness (generalized): Secondary | ICD-10-CM

## 2017-08-17 DIAGNOSIS — I69315 Cognitive social or emotional deficit following cerebral infarction: Secondary | ICD-10-CM

## 2017-08-17 DIAGNOSIS — R2681 Unsteadiness on feet: Secondary | ICD-10-CM | POA: Diagnosis not present

## 2017-08-17 DIAGNOSIS — R261 Paralytic gait: Secondary | ICD-10-CM | POA: Insufficient documentation

## 2017-08-17 DIAGNOSIS — I69352 Hemiplegia and hemiparesis following cerebral infarction affecting left dominant side: Secondary | ICD-10-CM | POA: Diagnosis not present

## 2017-08-17 DIAGNOSIS — R41840 Attention and concentration deficit: Secondary | ICD-10-CM

## 2017-08-17 DIAGNOSIS — R471 Dysarthria and anarthria: Secondary | ICD-10-CM | POA: Diagnosis not present

## 2017-08-17 DIAGNOSIS — R41841 Cognitive communication deficit: Secondary | ICD-10-CM | POA: Diagnosis not present

## 2017-08-17 NOTE — Therapy (Signed)
[facility] [address] [place], [place], [address] Phone: [phone]   Fax:  [phone]  Occupational Therapy Treatment  Patient Details  Name: Lindsey [name] MRN: [id number] Date of Birth: 03-14-1971 Referring Provider: Alysia Penna, MD   Encounter Date: 08/17/2017  OT End of Session - 08/17/17 1642    Visit Number  5    Number of Visits  9    Authorization Type  Medicaid    OT Start Time  1402    OT Stop Time  1441    OT Time Calculation (min)  39 min       Past Medical History:  Diagnosis Date  . Anemia   . Antithrombin III deficiency ([place])    ?pt not sure if true diagnosis  . Anxiety   . Apical mural thrombus   . Blood transfusion   . Cholecystitis 07/2016  . Coronary artery disease    a. apical LAD infarction '00. b. NSTEMI s/p BMS to prox LAD '09. c. Cath 01/2015: stable LAD stent, otherwise minimal nonobstructive CAD.  Marland Kitchen GERD (gastroesophageal reflux disease)   . Hyperlipidemia   . Hypertension   . Myocardial infarction ([place])   . Noncompliance with medication regimen    a. h/o noncompliance with med regimen (previous running out of Coumadin).  . Stroke ([place])    assoc with short term memory loss and right peripheral vision loss; age 30   . TIA (transient ischemic attack) 2010  . Tobacco abuse     Past Surgical History:  Procedure Laterality Date  . CARDIAC CATHETERIZATION    . CARDIAC CATHETERIZATION N/A 01/29/2015   Procedure: Left Heart Cath and Coronary Angiography;  Surgeon: Peter M Martinique, MD;  Location: [facility] CV LAB;  Service: Cardiovascular;  Laterality: N/A;  . CHOLECYSTECTOMY N/A 08/14/2016   Procedure: LAPAROSCOPIC CHOLECYSTECTOMY;  Surgeon: Greer Pickerel, MD;  Location: [facility];  Service: General;  Laterality: N/A;  . COLONOSCOPY N/A 03/29/2014   Procedure: COLONOSCOPY;  Surgeon: Inda Castle, MD;  Location: [facility];  Service: Endoscopy;  Laterality: N/A;  . CORONARY  ANGIOPLASTY    . ESOPHAGOGASTRODUODENOSCOPY N/A 06/09/2012   Procedure: ESOPHAGOGASTRODUODENOSCOPY (EGD);  Surgeon: Beryle Beams, MD;  Location: [facility] ENDOSCOPY;  Service: Endoscopy;  Laterality: N/A;  . ESOPHAGOGASTRODUODENOSCOPY N/A 08/02/2013   Procedure: ESOPHAGOGASTRODUODENOSCOPY (EGD);  Surgeon: Ladene Artist, MD;  Location: [facility] ENDOSCOPY;  Service: Endoscopy;  Laterality: N/A;  . IR ANGIO INTRA EXTRACRAN SEL COM CAROTID INNOMINATE UNI R MOD SED  07/08/2017  . LEFT HEART CATHETERIZATION WITH CORONARY ANGIOGRAM N/A 12/24/2011   Procedure: LEFT HEART CATHETERIZATION WITH CORONARY ANGIOGRAM;  Surgeon: Burnell Blanks, MD;  Location: [facility] CATH LAB;  Service: Cardiovascular;  Laterality: N/A;  . RADIOLOGY WITH ANESTHESIA N/A 07/08/2017   Procedure: IR WITH ANESTHESIA;  Surgeon: Radiologist, Medication, MD;  Location: [facility];  Service: Radiology;  Laterality: N/A;  . TUBAL LIGATION      Vitals:   08/17/17 1420  BP: 93/72    Subjective Assessment - 08/17/17 1420    Subjective   I have been feeling light headed and tired the last few days (see vital sections)    Pertinent History  R MCA CVA on 07/08/2017; visual deficits    Patient Stated Goals  I want to gain confidence to do things normally again    Currently in Pain?  Yes    Pain Score  3     Pain Location  Leg  Pain Orientation  Left    Pain Descriptors / Indicators  -- pulling    Pain Type  Acute pain    Pain Onset  Today    Pain Frequency  Constant    Aggravating Factors   don't know - I noticed it after my walk today.    Pain Relieving Factors  don't know                   OT Treatments/Exercises (OP) - 08/17/17 0001      ADLs   Overall ADLs  Provided written information regarding stroke risk factors as well as signs and symptoms of stroke. Pt verbalized understanding after reviewing information.     ADL Comments  Pt reports feeling light headed and tired over last several days. BP today was 92/73 in sitting  and 93/73 in standing however pt reports it has been lower than that at home and was last week in PT.  Recommended that pt call primary MD to make her aware and wrote down BP here in clinic today and last PT session.  Pt verbalized she would call.  Encouraged pt to make sure she was keeping fluids up especially with warmer weather. Pt verbalized understanding.  Checked grip strength status as well as coordination status (pt has imrpoved with L hand to 21.92).. Checked remaining goals.  Pt reports she can now do her own hair and put on her own bra. Pt also reports that she has been cooking at home with her mom supervising and made her own cold sandwich for lunch today and did her dishes.  Pt reports that she feels her memory is improving (I am remembering to turn off the shower water now but I still don't feel safe cooking unless someone is in the kitchen with me).  See goal section for updates. Pt has met all OT goals and is ready for discharge - pt in agreement.              OT Education - 08/17/17 1640    Education provided  Yes    Education Details  shared goal status, signs and symptoms of stroke, risk factors of stroke    Person(s) Educated  Patient    Methods  Explanation    Comprehension  Verbalized understanding       OT Short Term Goals - 08/17/17 1641      OT SHORT TERM GOAL #1   Title  Patient will complete a home exercise program designed to improve strength in left hand with min prompting due 6/8    Status  Achieved      OT SHORT TERM GOAL #2   Title  Patient will complete a home activities program designed to increase her use of left hand as dominant 50% time with min prompting    Baseline  Patient is not spontaneoulsly using left hand as dominant    Time  3    Period  Weeks    Status  Achieved      OT SHORT TERM GOAL #3   Title  Patient will utilize stove top to effectively to cook one item with supervision    Baseline  Patient is not cooking using stove or oven     Time  3    Status  Achieved        OT Long Term Goals - 08/17/17 1641      OT LONG TERM GOAL #1   Title  Patient will complete a home  exercise program for LUE strengthening independently (due 6/23 - see Mcaid approval end date)    Status  Achieved      OT LONG TERM GOAL #2   Title  Patient will utilize left hand as dominant 75% time during ADL/IADL without prompting    Status  Achieved      OT LONG TERM GOAL #3   Title  Patient will utilize a medication management system to help her comply with daily medications with supervision    Status  Achieved      OT LONG TERM GOAL #4   Title  Patient will demonstrate an 8 lb imprvement in left hand strength    Status  Achieved      OT LONG TERM GOAL #5   Title  Patent will independently complete hair care and doffing bra in reasonable timeframe.      Status  Achieved            Plan - 08/17/17 1641    Clinical Impression Statement  Pt has met all OT goals. Pt to continue to work with ST on cognitive deficits at this time.     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  Has medical history significant for blood clotting disorder history of heart attacks and stroke.      Occupational performance deficits (Please refer to evaluation for details):  ADL's;IADL's;Work;Leisure;Social Participation    Rehab Potential  Good    OT Frequency  2x / week    OT Treatment/Interventions  Self-care/ADL training;Therapeutic exercise;Functional Mobility Training;Balance training;Neuromuscular education;Energy conservation;Therapeutic activities;Coping strategies training;Cognitive remediation/compensation;DME and/or AE instruction;Visual/perceptual remediation/compensation;Patient/family education    Plan  d/c from OT    Consulted and Agree with Plan of Care  Patient       Patient will benefit from skilled  therapeutic intervention in order to improve the following deficits and impairments:     Visit Diagnosis: Hemiplegia and hemiparesis following cerebral infarction affecting left dominant side (HCC)  Muscle weakness (generalized)  Attention and concentration deficit  Cognitive social or emotional deficit following cerebral infarction  Visuospatial deficit  Unsteadiness on feet    Problem List Patient Active Problem List   Diagnosis Date Noted  . Acute blood loss anemia   . Vascular headache   . Hemiparesis affecting left side as late effect of stroke ([place])   . Tobacco use disorder 07/13/2017  . Noncompliance with medication regimen 07/13/2017  . Acute ischemic right MCA stroke ([place]) 07/13/2017  . Benign essential HTN   . Stroke (cerebrum) ([place]) 07/08/2017  . Middle cerebral artery embolism, right 07/08/2017  . Vitamin B12 deficiency 08/15/2016  . Acute cholecystitis 08/14/2016  . GERD (gastroesophageal reflux disease) 04/24/2015  . Hypertensive urgency 04/23/2015  . Chest pain, unspecified   . Right leg swelling 03/07/2015  . Preventative health care 02/21/2015  . Esophageal reflux   . NSTEMI (non-ST elevated myocardial infarction) ([place]) 01/28/2015  . Nausea without vomiting 12/22/2014  . Dysuria 12/21/2014  . Diarrhea 09/26/2014  . Difficulty swallowing pills 09/26/2014  . Hemorrhoids 06/16/2014  . Elevated AST (SGOT) 03/27/2014  . Lumbar herniated disc 03/27/2014  . TIA (transient ischemic attack) 02/19/2014  . History of ischemic left PCA stroke 02/19/2014  . Palpitations 01/27/2014  . Depression 08/15/2013  . Long term current use of anticoagulant therapy 08/10/2013  . Gastritis and duodenitis 08/03/2013  . Diverticulosis 08/03/2013  .  Fatty liver 08/03/2013  . Esophagitis 08/03/2013  . Hiatal hernia 08/03/2013  . Apical mural thrombus 08/02/2013  . GI bleeding 06/04/2012  . Hypokalemia 06/04/2012  . Antithrombin III deficiency ([place]) 06/04/2012  .  Paresthesias 06/29/2011  . Hyperlipemia 06/07/2009  . Essential hypertension 05/17/2009  . OBESITY-MORBID (>100') 03/13/2008  . CAD S/P  BMS to proximal LAD 2009-patent 01/29/15 06/28/2007   OCCUPATIONAL THERAPY DISCHARGE SUMMARY  Visits from Start of Care: 5  Current functional level related to goals / functional outcomes: See above   Remaining deficits: Cognitive deficits - ST continuing to address   Education / Equipment: HEP Plan: Patient agrees to discharge.  Patient goals were met. Patient is being discharged due to meeting the stated rehab goals.  ?????      Quay Burow, OTR/L 08/17/2017, 4:43 PM  [facility] [address] [place], [place], [address] Phone: [phone]   Fax:  [phone]  Name: Lindsey [name] MRN: [id number] Date of Birth: [birth year]

## 2017-08-17 NOTE — Therapy (Signed)
[facility] [address] [place], [place], [address] Phone: [phone]   Fax:  [phone]  Physical Therapy Treatment  Patient Details  Name: Lindsey [name] MRN: [id number] Date of Birth: 1970-04-25 Referring Provider: Alysia Penna, MD   Encounter Date: 08/17/2017  PT End of Session - 08/17/17 2314    Visit Number  4    Number of Visits  7    Authorization Type  Medicaid    Authorization Time Period  5/20 - 6/9    Authorization - Visit Number  3    Authorization - Number of Visits  3    PT Start Time  7591    PT Stop Time  1530    PT Time Calculation (min)  45 min    Activity Tolerance  Patient tolerated treatment well    Behavior During Therapy  [facility] for tasks assessed/performed       Past Medical History:  Diagnosis Date  . Anemia   . Antithrombin III deficiency ([place])    ?pt not sure if true diagnosis  . Anxiety   . Apical mural thrombus   . Blood transfusion   . Cholecystitis 07/2016  . Coronary artery disease    a. apical LAD infarction '00. b. NSTEMI s/p BMS to prox LAD '09. c. Cath 01/2015: stable LAD stent, otherwise minimal nonobstructive CAD.  Marland Kitchen GERD (gastroesophageal reflux disease)   . Hyperlipidemia   . Hypertension   . Myocardial infarction ([place])   . Noncompliance with medication regimen    a. h/o noncompliance with med regimen (previous running out of Coumadin).  . Stroke ([place])    assoc with short term memory loss and right peripheral vision loss; age 14   . TIA (transient ischemic attack) 2010  . Tobacco abuse     Past Surgical History:  Procedure Laterality Date  . CARDIAC CATHETERIZATION    . CARDIAC CATHETERIZATION N/A 01/29/2015   Procedure: Left Heart Cath and Coronary Angiography;  Surgeon: Peter M Martinique, MD;  Location: [place] CV LAB;  Service: Cardiovascular;  Laterality: N/A;  . CHOLECYSTECTOMY N/A 08/14/2016   Procedure: LAPAROSCOPIC CHOLECYSTECTOMY;  Surgeon:  Greer Pickerel, MD;  Location: [place];  Service: General;  Laterality: N/A;  . COLONOSCOPY N/A 03/29/2014   Procedure: COLONOSCOPY;  Surgeon: Inda Castle, MD;  Location: [place];  Service: Endoscopy;  Laterality: N/A;  . CORONARY ANGIOPLASTY    . ESOPHAGOGASTRODUODENOSCOPY N/A 06/09/2012   Procedure: ESOPHAGOGASTRODUODENOSCOPY (EGD);  Surgeon: Beryle Beams, MD;  Location: [facility];  Service: Endoscopy;  Laterality: N/A;  . ESOPHAGOGASTRODUODENOSCOPY N/A 08/02/2013   Procedure: ESOPHAGOGASTRODUODENOSCOPY (EGD);  Surgeon: Ladene Artist, MD;  Location: [facility] ENDOSCOPY;  Service: Endoscopy;  Laterality: N/A;  . IR ANGIO INTRA EXTRACRAN SEL COM CAROTID INNOMINATE UNI R MOD SED  07/08/2017  . LEFT HEART CATHETERIZATION WITH CORONARY ANGIOGRAM N/A 12/24/2011   Procedure: LEFT HEART CATHETERIZATION WITH CORONARY ANGIOGRAM;  Surgeon: Burnell Blanks, MD;  Location: [facility] CATH LAB;  Service: Cardiovascular;  Laterality: N/A;  . RADIOLOGY WITH ANESTHESIA N/A 07/08/2017   Procedure: IR WITH ANESTHESIA;  Surgeon: Radiologist, Medication, MD;  Location: [place];  Service: Radiology;  Laterality: N/A;  . TUBAL LIGATION      Vitals:   08/17/17 1444 08/17/17 1452  BP: 100/74 93/68  Pulse: 65 67    Subjective Assessment - 08/17/17 1444    Subjective  No falls. She is walking 36mn daily some times 2x/day.     Pertinent  History  R CVA 07/08/17 & hx of ischemic left PCA stroke, HTN, NSTEMI, CAD, MI, TIA,    Limitations  Lifting;Walking;House hold activities;Standing    Patient Stated Goals  walking, stairs, getting in/out shower, no falls,     Currently in Pain?  Yes    Pain Score  3     Pain Location  Leg    Pain Orientation  Left;Lateral    Pain Descriptors / Indicators  Other (Comment) pullling    Pain Type  Acute pain    Pain Onset  Today    Pain Frequency  Constant         [facility] PT Assessment - 08/17/17 1445      Ambulation/Gait   Gait velocity  3.07 ft/sec comfortable & 4.35 ft/sec  fast pace      Berg Balance Test   Sit to Stand  Able to stand without using hands and stabilize independently    Standing Unsupported  Able to stand safely 2 minutes    Sitting with Back Unsupported but Feet Supported on Floor or Stool  Able to sit safely and securely 2 minutes    Stand to Sit  Sits safely with minimal use of hands    Transfers  Able to transfer safely, minor use of hands    Standing Unsupported with Eyes Closed  Able to stand 10 seconds safely    Standing Ubsupported with Feet Together  Able to place feet together independently and stand 1 minute safely    From Standing, Reach Forward with Outstretched Arm  Can reach confidently >25 cm (10")    From Standing Position, Pick up Object from Floor  Able to pick up shoe safely and easily    From Standing Position, Turn to Look Behind Over each Shoulder  Looks behind from both sides and weight shifts well    Turn 360 Degrees  Able to turn 360 degrees safely in 4 seconds or less    Standing Unsupported, Alternately Place Feet on Step/Stool  Able to stand independently and safely and complete 8 steps in 20 seconds    Standing Unsupported, One Foot in Front  Able to place foot tandem independently and hold 30 seconds    Standing on One Leg  Able to lift leg independently and hold > 10 seconds    Total Score  56    Berg comment:  Initial was 51/56      Timed Up and Go Test   Normal TUG (seconds)  9.08    Cognitive TUG (seconds)  16.92 Initial was 21.22;  today was 86% increase from std TUG      Functional Gait  Assessment   Gait assessed   Yes    Gait Level Surface  Walks 20 ft in less than 7 sec but greater than 5.5 sec, uses assistive device, slower speed, mild gait deviations, or deviates 6-10 in outside of the 12 in walkway width.    Change in Gait Speed  Able to change speed, demonstrates mild gait deviations, deviates 6-10 in outside of the 12 in walkway width, or no gait deviations, unable to achieve a major change in  velocity, or uses a change in velocity, or uses an assistive device.    Gait with Horizontal Head Turns  Performs head turns smoothly with slight change in gait velocity (eg, minor disruption to smooth gait path), deviates 6-10 in outside 12 in walkway width, or uses an assistive device.    Gait with Vertical Head  Turns  Performs task with slight change in gait velocity (eg, minor disruption to smooth gait path), deviates 6 - 10 in outside 12 in walkway width or uses assistive device    Gait and Pivot Turn  Pivot turns safely in greater than 3 sec and stops with no loss of balance, or pivot turns safely within 3 sec and stops with mild imbalance, requires small steps to catch balance.    Step Over Obstacle  Is able to step over one shoe box (4.5 in total height) without changing gait speed. No evidence of imbalance.    Gait with Narrow Base of Support  Ambulates 4-7 steps.    Gait with Eyes Closed  Walks 20 ft, slow speed, abnormal gait pattern, evidence for imbalance, deviates 10-15 in outside 12 in walkway width. Requires more than 9 sec to ambulate 20 ft.    Ambulating Backwards  Walks 20 ft, slow speed, abnormal gait pattern, evidence for imbalance, deviates 10-15 in outside 12 in walkway width.    Steps  Alternating feet, must use rail.    Total Score  17    FGA comment:  Initial was 9/30                   [facility] Adult PT Treatment/Exercise - 08/17/17 1445      Ambulation/Gait   Ambulation/Gait  Yes    Ambulation/Gait Assistance  5: Supervision    Ambulation/Gait Assistance Details  simple cognitive tasks without LOB, Patient has balance losses with divided attention tasks & scanning while ambulating.     Ambulation Distance (Feet)  600 Feet    Assistive device  None    Ambulation Surface  Level;Indoor;Outdoor;Paved    Stairs  Yes    Stairs Assistance  5: Supervision    Stairs Assistance Details (indicate cue type and reason)  verbal cues on wt shift & LE movement pattern     Stair Management Technique  One rail Left;Alternating pattern;Forwards    Number of Stairs  4    Ramp  5: Supervision no device    Curb  5: Supervision no device             PT Education - 08/17/17 1500    Education provided  Yes    Education Details  dehydration & BP, limit caffiene, performing cognitive task while walking    Person(s) Educated  Patient    Methods  Explanation    Comprehension  Verbalized understanding;Need further instruction       PT Short Term Goals - 08/17/17 2315      PT SHORT TERM GOAL #1   Title  Patient demonstrates understanding of initial HEP. (All STGs Target Date 4th visit)    Baseline  MET 08/17/2017    Time  3    Period  Weeks    Status  Achieved      PT SHORT TERM GOAL #2   Title  Patient ambulates 500' outdoors including ramps & curbs without device with supervision.    Baseline  MET 08/17/2017    Time  3    Period  Weeks    Status  Achieved      PT SHORT TERM GOAL #3   Title  Patient is able to ambulate while performing simple cognitive task without balance loss.     Baseline  MET 08/17/2017    Time  3    Period  Weeks    Status  Achieved  PT Long Term Goals - 08/17/17 2316      PT LONG TERM GOAL #1   Title  Patient verbalizes & demonstrates ongoing HEP/ fitness plan. (All LTGs Target Date: 7th visit)    Baseline  08/17/2017 Patient is dependent in knowledge to advance exercises with medical issues including low BP.     Time  6    Period  Weeks    Status  On-going      PT LONG TERM GOAL #2   Title  Berg Balance </= 54/56    Baseline  MET 08/17/2017  Berg Balance 56/56    Time  6    Period  Weeks    Status  Achieved      PT LONG TERM GOAL #3   Title  Cognitive (simple naming task A-Z) Timed Up-Go <15.5 seconds     Baseline  08/17/2017 Cognitive TUG 16.92sec    Time  6    Period  Weeks    Status  On-going      PT LONG TERM GOAL #4   Title  Functional Gait Assessment >/= 19/30    Baseline  08/17/2017  Functional Gait  Assessment 17/30    Time  6    Period  Weeks    Status  On-going      PT LONG TERM GOAL #5   Title  Patient ambulates 1000' outdoors including grass, ramps, curbs & stairs single rail without device modified independent.     Baseline  08/17/2017  Patient ambulates 600' on paved, firm surfaces, ramps & cubs with supervision. Patient has balance losses with divided attention.     Time  6    Period  Weeks    Status  On-going            Plan - 08/17/17 2325    Clinical Impression Statement  Patient met STGs set for first 3 visits. She continues to have balance deficits especially with divided attention activities. She is young & has 49yo dtr that requires ability to multi tasks during gait.     Rehab Potential  Good    PT Frequency  1x / week    PT Duration  6 weeks    PT Treatment/Interventions  ADLs/Self Care Home Management;Gait training;Stair training;Functional mobility training;Therapeutic activities;Therapeutic exercise;Balance training;Neuromuscular re-education;Patient/family education;Canalith Repostioning;Vestibular    PT Next Visit Plan  How has her BP been, still low?? gait on outdoor surfaces/compliant surfaces, dynamic gait activities, dual tasking with gait.     Consulted and Agree with Plan of Care  Patient       Patient will benefit from skilled therapeutic intervention in order to improve the following deficits and impairments:  Abnormal gait, Decreased activity tolerance, Decreased balance, Decreased coordination, Decreased endurance, Decreased mobility, Decreased strength  Visit Diagnosis: Hemiplegia and hemiparesis following cerebral infarction affecting left dominant side (HCC)  Muscle weakness (generalized)  Unsteadiness on feet  Paralytic gait     Problem List Patient Active Problem List   Diagnosis Date Noted  . Acute blood loss anemia   . Vascular headache   . Hemiparesis affecting left side as late effect of stroke ([place])   . Tobacco use disorder  07/13/2017  . Noncompliance with medication regimen 07/13/2017  . Acute ischemic right MCA stroke ([place]) 07/13/2017  . Benign essential HTN   . Stroke (cerebrum) ([place]) 07/08/2017  . Middle cerebral artery embolism, right 07/08/2017  . Vitamin B12 deficiency 08/15/2016  . Acute cholecystitis 08/14/2016  . GERD (gastroesophageal reflux  disease) 04/24/2015  . Hypertensive urgency 04/23/2015  . Chest pain, unspecified   . Right leg swelling 03/07/2015  . Preventative health care 02/21/2015  . Esophageal reflux   . NSTEMI (non-ST elevated myocardial infarction) ([place]) 01/28/2015  . Nausea without vomiting 12/22/2014  . Dysuria 12/21/2014  . Diarrhea 09/26/2014  . Difficulty swallowing pills 09/26/2014  . Hemorrhoids 06/16/2014  . Elevated AST (SGOT) 03/27/2014  . Lumbar herniated disc 03/27/2014  . TIA (transient ischemic attack) 02/19/2014  . History of ischemic left PCA stroke 02/19/2014  . Palpitations 01/27/2014  . Depression 08/15/2013  . Long term current use of anticoagulant therapy 08/10/2013  . Gastritis and duodenitis 08/03/2013  . Diverticulosis 08/03/2013  . Fatty liver 08/03/2013  . Esophagitis 08/03/2013  . Hiatal hernia 08/03/2013  . Apical mural thrombus 08/02/2013  . GI bleeding 06/04/2012  . Hypokalemia 06/04/2012  . Antithrombin III deficiency ([place]) 06/04/2012  . Paresthesias 06/29/2011  . Hyperlipemia 06/07/2009  . Essential hypertension 05/17/2009  . OBESITY-MORBID (>100') 03/13/2008  . CAD S/P  BMS to proximal LAD 2009-patent 01/29/15 06/28/2007    Brandace Cargle PT, DPT 08/17/2017, 11:32 PM  [facility] [address] [place], [place], [address] Phone: [phone]   Fax:  [phone]  Name: Lindsey [name] MRN: [id number] Date of Birth: [birth year]

## 2017-08-18 ENCOUNTER — Ambulatory Visit: Payer: Medicare Other

## 2017-08-18 DIAGNOSIS — M6281 Muscle weakness (generalized): Secondary | ICD-10-CM | POA: Diagnosis not present

## 2017-08-18 DIAGNOSIS — R261 Paralytic gait: Secondary | ICD-10-CM | POA: Diagnosis not present

## 2017-08-18 DIAGNOSIS — R2681 Unsteadiness on feet: Secondary | ICD-10-CM | POA: Diagnosis not present

## 2017-08-18 DIAGNOSIS — R41841 Cognitive communication deficit: Secondary | ICD-10-CM | POA: Diagnosis not present

## 2017-08-18 DIAGNOSIS — I69352 Hemiplegia and hemiparesis following cerebral infarction affecting left dominant side: Secondary | ICD-10-CM | POA: Diagnosis not present

## 2017-08-18 DIAGNOSIS — R471 Dysarthria and anarthria: Secondary | ICD-10-CM | POA: Diagnosis not present

## 2017-08-18 NOTE — Therapy (Signed)
[facility] [address] [place], [place], [address] Phone: [phone]   Fax:  [phone]  Speech Language Pathology Treatment  Patient Details  Name: Lindsey [name] MRN: [id number] Date of Birth: Mar 03, 1971 Referring Provider: Dr. Claudette Laws   Encounter Date: 08/18/2017  End of Session - 08/18/17 1107    Visit Number  4    Number of Visits  9    Date for SLP Re-Evaluation  10/20/17    Authorization Type  - 8 visits through 09/14/17    Authorization - Visit Number  4    Authorization - Number of Visits  8    SLP Start Time  1021    SLP Stop Time   1103    SLP Time Calculation (min)  42 min    Activity Tolerance  Patient tolerated treatment well       Past Medical History:  Diagnosis Date  . Anemia   . Antithrombin III deficiency (HCC)    ?pt not sure if true diagnosis  . Anxiety   . Apical mural thrombus   . Blood transfusion   . Cholecystitis 07/2016  . Coronary artery disease    a. apical LAD infarction '00. b. NSTEMI s/p BMS to prox LAD '09. c. Cath 01/2015: stable LAD stent, otherwise minimal nonobstructive CAD.  Marland Kitchen GERD (gastroesophageal reflux disease)   . Hyperlipidemia   . Hypertension   . Myocardial infarction (HCC)   . Noncompliance with medication regimen    a. h/o noncompliance with med regimen (previous running out of Coumadin).  . Stroke (HCC)    assoc with short term memory loss and right peripheral vision loss; age 85   . TIA (transient ischemic attack) 2010  . Tobacco abuse     Past Surgical History:  Procedure Laterality Date  . CARDIAC CATHETERIZATION    . CARDIAC CATHETERIZATION N/A 01/29/2015   Procedure: Left Heart Cath and Coronary Angiography;  Surgeon: Peter M Swaziland, MD;  Location: [facility] INVASIVE CV LAB;  Service: Cardiovascular;  Laterality: N/A;  . CHOLECYSTECTOMY N/A 08/14/2016   Procedure: LAPAROSCOPIC CHOLECYSTECTOMY;  Surgeon: Gaynelle Adu, MD;  Location: [facility] OR;   Service: General;  Laterality: N/A;  . COLONOSCOPY N/A 03/29/2014   Procedure: COLONOSCOPY;  Surgeon: Louis Meckel, MD;  Location: [facility] ENDOSCOPY;  Service: Endoscopy;  Laterality: N/A;  . CORONARY ANGIOPLASTY    . ESOPHAGOGASTRODUODENOSCOPY N/A 06/09/2012   Procedure: ESOPHAGOGASTRODUODENOSCOPY (EGD);  Surgeon: Theda Belfast, MD;  Location: [facility] ENDOSCOPY;  Service: Endoscopy;  Laterality: N/A;  . ESOPHAGOGASTRODUODENOSCOPY N/A 08/02/2013   Procedure: ESOPHAGOGASTRODUODENOSCOPY (EGD);  Surgeon: Meryl Dare, MD;  Location: [facility] ENDOSCOPY;  Service: Endoscopy;  Laterality: N/A;  . IR ANGIO INTRA EXTRACRAN SEL COM CAROTID INNOMINATE UNI R MOD SED  07/08/2017  . LEFT HEART CATHETERIZATION WITH CORONARY ANGIOGRAM N/A 12/24/2011   Procedure: LEFT HEART CATHETERIZATION WITH CORONARY ANGIOGRAM;  Surgeon: Kathleene Hazel, MD;  Location: [facility] CATH LAB;  Service: Cardiovascular;  Laterality: N/A;  . RADIOLOGY WITH ANESTHESIA N/A 07/08/2017   Procedure: IR WITH ANESTHESIA;  Surgeon: Radiologist, Medication, MD;  Location: [facility] OR;  Service: Radiology;  Laterality: N/A;  . TUBAL LIGATION      There were no vitals filed for this visit.         ADULT SLP TREATMENT - 08/18/17 1026      General Information   Behavior/Cognition  Alert;Cooperative;Pleasant mood      Treatment Provided   Treatment provided  Cognitive-Linquistic  Cognitive-Linquistic Treatment   Treatment focused on  Cognition    Skilled Treatment  SLP used detailed math task (hours worked, Catering manager) to target simple alternating attention and problem solving. Pt req'd cues initially from SLP to decode what the questions were asking of her. Pt req'd extra time for task completion as well as the need to double check her answers. Min cues were rarely needed for alternating attention in this simple task. Pt did not double check her answers and SLP cued her to do so. She req'd reminders to double check for other tasks during the session. SLP  encouraged pt to put notes into her phone or on her calendar for her remaining therapy visits.      Assessment / Recommendations / Plan   Plan  Continue with current plan of care      Progression Toward Goals   Progression toward goals  Progressing toward goals       SLP Education - 08/18/17 1107    Education provided  Yes    Education Details  compensations for attention    Person(s) Educated  Patient    Methods  Explanation;Demonstration    Comprehension  Verbalized understanding;Returned demonstration;Need further instruction       SLP Short Term Goals - 08/18/17 1110      SLP SHORT TERM GOAL #1   Title  Pt will complete HEP for dysarthria with rare min A.    Baseline  Pt is not completing exercises for dysarthria    Time  2    Period  Weeks    Status  On-going      SLP SHORT TERM GOAL #2   Title  Pt will solve functional money problems with rare min A.,    Baseline  Pt could not solve simple money problems on eval     Time  2    Period  Weeks    Status  On-going      SLP SHORT TERM GOAL #3   Title  Pt will alternate attention between 2 simple cognitive lingusitic tasks with 85% on each and rare min A    Baseline  Pt attention level is selective at eval    Time  2    Period  Weeks    Status  On-going      SLP SHORT TERM GOAL #4   Title  Pt will use external aids for daily recall and schedule management    Baseline  Pt is not uisng external aids to help her remember or attend    Time  2    Period  Weeks    Status  On-going       SLP Long Term Goals - 08/18/17 1110      SLP LONG TERM GOAL #1   Title  Pt will balance mildly complex money account with rare min A    Baseline  Pt unable to do simple check balancing at eval    Time  5    Period  Weeks    Status  On-going      SLP LONG TERM GOAL #2   Title  Pt will use external aids/system to manage her home financial management over 2 sessions     Baseline  Her mother is currently managing her finances     Time  5    Period  Weeks    Status  On-going      SLP LONG TERM GOAL #3   Title  Pt will alternate  attention between 2 mildly complex cognitive linguistic tasks with 85% on each and occasional min A    Baseline  Attention level is selective at eval    Time  5    Period  Weeks    Status  On-going       Plan - 08/18/17 1108    Clinical Impression Statement  Pt continues to require min to mod A and extended time for time/money math, alternating attention, awareness. She req'd cues today for use of strategies for attention and to double check her work for accuracy. Continue skilled ST to maximize cognition for possible return to work.    Speech Therapy Frequency  2x / week    Duration  -- 6 weeks    Treatment/Interventions  SLP instruction and feedback;Cueing hierarchy;Oral motor exercises;Cognitive reorganization;Functional tasks;Compensatory strategies;Patient/family education;Internal/external aids;Environmental controls    Potential to Achieve Goals  Good       Patient will benefit from skilled therapeutic intervention in order to improve the following deficits and impairments:   Cognitive communication deficit  Dysarthria and anarthria    Problem List Patient Active Problem List   Diagnosis Date Noted  . Acute blood loss anemia   . Vascular headache   . Hemiparesis affecting left side as late effect of stroke (HCC)   . Tobacco use disorder 07/13/2017  . Noncompliance with medication regimen 07/13/2017  . Acute ischemic right MCA stroke (HCC) 07/13/2017  . Benign essential HTN   . Stroke (cerebrum) (HCC) 07/08/2017  . Middle cerebral artery embolism, right 07/08/2017  . Vitamin B12 deficiency 08/15/2016  . Acute cholecystitis 08/14/2016  . GERD (gastroesophageal reflux disease) 04/24/2015  . Hypertensive urgency 04/23/2015  . Chest pain, unspecified   . Right leg swelling 03/07/2015  . Preventative health care 02/21/2015  . Esophageal reflux   . NSTEMI (non-ST  elevated myocardial infarction) (HCC) 01/28/2015  . Nausea without vomiting 12/22/2014  . Dysuria 12/21/2014  . Diarrhea 09/26/2014  . Difficulty swallowing pills 09/26/2014  . Hemorrhoids 06/16/2014  . Elevated AST (SGOT) 03/27/2014  . Lumbar herniated disc 03/27/2014  . TIA (transient ischemic attack) 02/19/2014  . History of ischemic left PCA stroke 02/19/2014  . Palpitations 01/27/2014  . Depression 08/15/2013  . Long term current use of anticoagulant therapy 08/10/2013  . Gastritis and duodenitis 08/03/2013  . Diverticulosis 08/03/2013  . Fatty liver 08/03/2013  . Esophagitis 08/03/2013  . Hiatal hernia 08/03/2013  . Apical mural thrombus 08/02/2013  . GI bleeding 06/04/2012  . Hypokalemia 06/04/2012  . Antithrombin III deficiency (HCC) 06/04/2012  . Paresthesias 06/29/2011  . Hyperlipemia 06/07/2009  . Essential hypertension 05/17/2009  . OBESITY-MORBID (>100') 03/13/2008  . CAD S/P  BMS to proximal LAD 2009-patent 01/29/15 06/28/2007    [facility] ,MS, CCC-SLP  08/18/2017, 11:11 AM  [facility] [address] [place], [place], [address] Phone: [phone]   Fax:  [phone]   Name: Lindsey [name] MRN: [id number] Date of Birth: 30-Jul-1970

## 2017-08-19 ENCOUNTER — Other Ambulatory Visit: Payer: Self-pay | Admitting: Physical Medicine and Rehabilitation

## 2017-08-20 ENCOUNTER — Ambulatory Visit: Payer: Medicare Other | Admitting: Speech Pathology

## 2017-08-20 ENCOUNTER — Encounter: Payer: Self-pay | Admitting: Speech Pathology

## 2017-08-20 DIAGNOSIS — R261 Paralytic gait: Secondary | ICD-10-CM | POA: Diagnosis not present

## 2017-08-20 DIAGNOSIS — M6281 Muscle weakness (generalized): Secondary | ICD-10-CM | POA: Diagnosis not present

## 2017-08-20 DIAGNOSIS — R2681 Unsteadiness on feet: Secondary | ICD-10-CM | POA: Diagnosis not present

## 2017-08-20 DIAGNOSIS — R41841 Cognitive communication deficit: Secondary | ICD-10-CM

## 2017-08-20 DIAGNOSIS — R471 Dysarthria and anarthria: Secondary | ICD-10-CM | POA: Diagnosis not present

## 2017-08-20 DIAGNOSIS — I69352 Hemiplegia and hemiparesis following cerebral infarction affecting left dominant side: Secondary | ICD-10-CM | POA: Diagnosis not present

## 2017-08-20 NOTE — Patient Instructions (Addendum)
  Make a binder with sections for ST, PT, OT, and calendar/schedule - keep your handouts in the front so you can read them during the week - and HW behind them   Your attention includes: concentration, focus, distractability, how long you can focus for, and multitasking   Double check - on your bills, consider circling the amount you are going to pay before you write checks - then double check it   Background noises can be more distracting and fatiguing  now, as your brain is not tuning them out as well  Use sign on the door for phone, keys, glasses etc  Read your therapy handouts several times a week  You must fill in your homework and bring it back - the pages that are not completed   Cognitive Activities you can do at home:   - Solitaire  - Majong  - Scrabble  - Chess/Checkers  - Crosswords (easy level)  - Education officer, community  - Card Games  - Board Games  - Connect 4  - Simon  - the Memory Game  - Dominoes  - Backgammon  - jig saws  On your computer, tablet or phone: Air traffic controller Game App [employer] Spot the difference games

## 2017-08-20 NOTE — Therapy (Signed)
[facility] [address] [place], [place], [address] Phone: [phone]   Fax:  [phone]  Speech Language Pathology Treatment  Patient Details  Name: Lindsey [name] MRN: [id number] Date of Birth: May 21, 1970 Referring Provider: Dr. Claudette Laws   Encounter Date: 08/20/2017  End of Session - 08/20/17 1015    Visit Number  5    Number of Visits  9    Date for SLP Re-Evaluation  10/20/17    Authorization Type  - 8 visits through 09/14/17    Authorization - Visit Number  5    Authorization - Number of Visits  8    SLP Start Time  0930    SLP Stop Time   1017    SLP Time Calculation (min)  47 min    Activity Tolerance  Patient tolerated treatment well       Past Medical History:  Diagnosis Date  . Anemia   . Antithrombin III deficiency (HCC)    ?pt not sure if true diagnosis  . Anxiety   . Apical mural thrombus   . Blood transfusion   . Cholecystitis 07/2016  . Coronary artery disease    a. apical LAD infarction '00. b. NSTEMI s/p BMS to prox LAD '09. c. Cath 01/2015: stable LAD stent, otherwise minimal nonobstructive CAD.  Marland Kitchen GERD (gastroesophageal reflux disease)   . Hyperlipidemia   . Hypertension   . Myocardial infarction (HCC)   . Noncompliance with medication regimen    a. h/o noncompliance with med regimen (previous running out of Coumadin).  . Stroke (HCC)    assoc with short term memory loss and right peripheral vision loss; age 86   . TIA (transient ischemic attack) 2010  . Tobacco abuse     Past Surgical History:  Procedure Laterality Date  . CARDIAC CATHETERIZATION    . CARDIAC CATHETERIZATION N/A 01/29/2015   Procedure: Left Heart Cath and Coronary Angiography;  Surgeon: Peter M Swaziland, MD;  Location: [facility] INVASIVE CV LAB;  Service: Cardiovascular;  Laterality: N/A;  . CHOLECYSTECTOMY N/A 08/14/2016   Procedure: LAPAROSCOPIC CHOLECYSTECTOMY;  Surgeon: Gaynelle Adu, MD;  Location: [facility] OR;   Service: General;  Laterality: N/A;  . COLONOSCOPY N/A 03/29/2014   Procedure: COLONOSCOPY;  Surgeon: Louis Meckel, MD;  Location: [facility] ENDOSCOPY;  Service: Endoscopy;  Laterality: N/A;  . CORONARY ANGIOPLASTY    . ESOPHAGOGASTRODUODENOSCOPY N/A 06/09/2012   Procedure: ESOPHAGOGASTRODUODENOSCOPY (EGD);  Surgeon: Theda Belfast, MD;  Location: [facility] ENDOSCOPY;  Service: Endoscopy;  Laterality: N/A;  . ESOPHAGOGASTRODUODENOSCOPY N/A 08/02/2013   Procedure: ESOPHAGOGASTRODUODENOSCOPY (EGD);  Surgeon: Meryl Dare, MD;  Location: [facility] ENDOSCOPY;  Service: Endoscopy;  Laterality: N/A;  . IR ANGIO INTRA EXTRACRAN SEL COM CAROTID INNOMINATE UNI R MOD SED  07/08/2017  . LEFT HEART CATHETERIZATION WITH CORONARY ANGIOGRAM N/A 12/24/2011   Procedure: LEFT HEART CATHETERIZATION WITH CORONARY ANGIOGRAM;  Surgeon: Kathleene Hazel, MD;  Location: [facility] CATH LAB;  Service: Cardiovascular;  Laterality: N/A;  . RADIOLOGY WITH ANESTHESIA N/A 07/08/2017   Procedure: IR WITH ANESTHESIA;  Surgeon: Radiologist, Medication, MD;  Location: [facility] OR;  Service: Radiology;  Laterality: N/A;  . TUBAL LIGATION      There were no vitals filed for this visit.  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  No/denies            ADULT SLP TREATMENT -  08/20/17 0943      General Information   Behavior/Cognition  Alert;Cooperative;Pleasant mood      Treatment Provided   Treatment provided  Cognitive-Linquistic      Cognitive-Linquistic Treatment   Treatment focused on  Cognition    Skilled Treatment  Pt rilfiling thourgh purse to find HW, all of her PT, appointments, prior ST HW and handouts are all mixed together. Facilitated organization of materials and instructed pt to get a notebook with dividers for each section. Pt forgot glasses in her basket - Instructed pt to put sign on her door for glasses, keys, phone. Alternating attention between complex  card sort and simple conversation with occasional min A to return to task. Extended time due to slow processing. Pt successfully compling dysarthria exercises at home      Assessment / Recommendations / Plan   Plan  Continue with current plan of care      Progression Toward Goals   Progression toward goals  Progressing toward goals       SLP Education - 08/20/17 1010    Education provided  Yes    Education Details  attention compensations, orgazniation to aid attention, external aids for attention    Person(s) Educated  Patient    Methods  Explanation;Demonstration    Comprehension  Verbalized understanding;Returned demonstration;Need further instruction       SLP Short Term Goals - 08/20/17 1014      SLP SHORT TERM GOAL #1   Title  Pt will complete HEP for dysarthria with rare min A.    Baseline  Pt is not completing exercises for dysarthria    Time  2    Period  Weeks    Status  Achieved      SLP SHORT TERM GOAL #2   Title  Pt will solve functional money problems with rare min A.,    Baseline  Pt could not solve simple money problems on eval     Time  2    Period  Weeks    Status  On-going      SLP SHORT TERM GOAL #3   Title  Pt will alternate attention between 2 simple cognitive lingusitic tasks with 85% on each and rare min A    Baseline  Pt attention level is selective at eval    Time  2    Period  Weeks    Status  On-going      SLP SHORT TERM GOAL #4   Title  Pt will use external aids for daily recall and schedule management    Baseline  Pt is not uisng external aids to help her remember or attend    Time  2    Period  Weeks    Status  On-going       SLP Long Term Goals - 08/20/17 1015      SLP LONG TERM GOAL #1   Title  Pt will balance mildly complex money account with rare min A    Baseline  Pt unable to do simple check balancing at eval    Time  5    Period  Weeks    Status  On-going      SLP LONG TERM GOAL #2   Title  Pt will use external  aids/system to manage her home financial management over 2 sessions     Baseline  Her mother is currently managing her finances    Time  5    Period  Weeks  Status  On-going      SLP LONG TERM GOAL #3   Title  Pt will alternate attention between 2 mildly complex cognitive linguistic tasks with 85% on each and occasional min A    Baseline  Attention level is selective at eval    Time  5    Period  Weeks    Status  On-going       Plan - 08/20/17 1012    Clinical Impression Statement  Pt continues to require min to mod A and extended time for time/money math, alternating attention, awareness. She req'd cues today for use of strategies for attention and to double check her work for accuracy. Continue skilled ST to maximize cognition for possible return to work.    Speech Therapy Frequency  2x / week    Treatment/Interventions  SLP instruction and feedback;Cueing hierarchy;Oral motor exercises;Cognitive reorganization;Functional tasks;Compensatory strategies;Patient/family education;Internal/external aids;Environmental controls    Potential to Achieve Goals  Good       Patient will benefit from skilled therapeutic intervention in order to improve the following deficits and impairments:   Cognitive communication deficit  Dysarthria and anarthria    Problem List Patient Active Problem List   Diagnosis Date Noted  . Acute blood loss anemia   . Vascular headache   . Hemiparesis affecting left side as late effect of stroke (HCC)   . Tobacco use disorder 07/13/2017  . Noncompliance with medication regimen 07/13/2017  . Acute ischemic right MCA stroke (HCC) 07/13/2017  . Benign essential HTN   . Stroke (cerebrum) (HCC) 07/08/2017  . Middle cerebral artery embolism, right 07/08/2017  . Vitamin B12 deficiency 08/15/2016  . Acute cholecystitis 08/14/2016  . GERD (gastroesophageal reflux disease) 04/24/2015  . Hypertensive urgency 04/23/2015  . Chest pain, unspecified   . Right leg  swelling 03/07/2015  . Preventative health care 02/21/2015  . Esophageal reflux   . NSTEMI (non-ST elevated myocardial infarction) (HCC) 01/28/2015  . Nausea without vomiting 12/22/2014  . Dysuria 12/21/2014  . Diarrhea 09/26/2014  . Difficulty swallowing pills 09/26/2014  . Hemorrhoids 06/16/2014  . Elevated AST (SGOT) 03/27/2014  . Lumbar herniated disc 03/27/2014  . TIA (transient ischemic attack) 02/19/2014  . History of ischemic left PCA stroke 02/19/2014  . Palpitations 01/27/2014  . Depression 08/15/2013  . Long term current use of anticoagulant therapy 08/10/2013  . Gastritis and duodenitis 08/03/2013  . Diverticulosis 08/03/2013  . Fatty liver 08/03/2013  . Esophagitis 08/03/2013  . Hiatal hernia 08/03/2013  . Apical mural thrombus 08/02/2013  . GI bleeding 06/04/2012  . Hypokalemia 06/04/2012  . Antithrombin III deficiency (HCC) 06/04/2012  . Paresthesias 06/29/2011  . Hyperlipemia 06/07/2009  . Essential hypertension 05/17/2009  . OBESITY-MORBID (>100') 03/13/2008  . CAD S/P  BMS to proximal LAD 2009-patent 01/29/15 06/28/2007    Ramondo Dietze, Radene Journey MS, CCC-SLP 08/20/2017, 10:19 AM  [facility] [address] [place], [place], [address] Phone: [phone]   Fax:  [phone]   Name: HEARTLEE DOIDGE MRN: [id number] Date of Birth: [birth year]

## 2017-08-21 ENCOUNTER — Encounter: Payer: Medicaid Other | Admitting: Occupational Therapy

## 2017-08-21 ENCOUNTER — Ambulatory Visit: Payer: Medicaid Other | Admitting: Physical Therapy

## 2017-08-24 ENCOUNTER — Ambulatory Visit: Payer: Medicare Other

## 2017-08-24 ENCOUNTER — Ambulatory Visit: Payer: Medicaid Other | Admitting: Adult Health

## 2017-08-24 ENCOUNTER — Encounter: Payer: Self-pay | Admitting: Adult Health

## 2017-08-24 ENCOUNTER — Ambulatory Visit: Payer: Medicare Other | Admitting: Physical Therapy

## 2017-08-24 VITALS — BP 110/78 | HR 66 | Ht 65.0 in | Wt 168.0 lb

## 2017-08-24 DIAGNOSIS — E785 Hyperlipidemia, unspecified: Secondary | ICD-10-CM

## 2017-08-24 DIAGNOSIS — I63511 Cerebral infarction due to unspecified occlusion or stenosis of right middle cerebral artery: Secondary | ICD-10-CM | POA: Diagnosis not present

## 2017-08-24 DIAGNOSIS — I214 Non-ST elevation (NSTEMI) myocardial infarction: Secondary | ICD-10-CM

## 2017-08-24 DIAGNOSIS — I10 Essential (primary) hypertension: Secondary | ICD-10-CM | POA: Diagnosis not present

## 2017-08-24 DIAGNOSIS — D6859 Other primary thrombophilia: Secondary | ICD-10-CM

## 2017-08-24 MED ORDER — CLOPIDOGREL BISULFATE 75 MG PO TABS: 75.0000 mg | ORAL_TABLET | Freq: Every day | ORAL | 4 refills | Status: DC

## 2017-08-24 NOTE — Progress Notes (Signed)
[facility] [address] [place]. [place] [address] [phone]       OFFICE FOLLOW UP NOTE  [name] Date of Birth:  07/12/1970 Medical Record Number:  [id number]   Reason for Referral:  hospital stroke follow up  CHIEF COMPLAINT:  Chief Complaint  Patient presents with  . Follow-up    Stroke hospital follow up room in hallway patient is alone in room    HPI: Lindsey [name] is being seen today for initial visit in the office for right MCA infarct on 07/08/17. History obtained from patient and chart review. Reviewed all radiology images and labs personally.  Lindsey [name] an 47 y.o.femalewith ? AT III deficiency, apical mural thrombus, prior history of stroke, HTN, HLD, MI who presented with sudden onset left facial droop and left arm weakness at 6.30 pm on 07/08/17. Patient last normal 07/08/2017 at 6.30 pm having dinner until daughter noticed left facial droop. EMS was called and patient stated she had right side weakness however on exam noted to have left side weakness and left facial droop. BP was 147/91 mm HG recorded by EMS. CBG was 145.tPA was not given as pt on Xarelto with last dose yesterday. CTA showed a right M2 occlusion with poor distal flow.  Cerebral angiogram showed recanalization/partial recanalization of M2.  There was no endovascular procedure performed.  She was admitted for further stroke evaluation and treatment.   It was later found the patient had missed doses of Xarelto and this was likely the etiology of her stroke.  CT head showed old left PCA infarct but no acute abnormality.  MRI head showed right frontal lobe and insula infarct.  CTA head and neck showed right M2 occlusion and bilateral V4 stenosis.  2D echo showed some LVH with an EF of 55% and akinesis of apical myocardium.  LDL 85 patient continue Lipitor 40 mg.  Patient was on Xarelto and Plavix prior to admission but as she was noncompliant was recommended to continue Plavix  and Xarelto.  Patient was taking Xarelto due to previous TTE's showing persistent LV thrombus and as she had fluctuating INR levels on Coumadin it was recommended to switch to Xarelto.  Patient prescribed Plavix PTA as she has a history of CAD/MI/NSTEMI and status post stent in the past. therapy recommended CIR at discharge.  Patient is being seen today for hospital follow-up.  She continues to participate in PT/OT/ST and has residual mild left hemiparesis with left facial droop and mild dysarthria.  Does have complaints of continued dizziness but this has been improving.  She continues to take Xarelto but recently ran out of Plavix approximately 5 days ago but denies bleeding or bruising.  Continues to take Lipitor without side effects myalgias.  Blood pressure today 110/78.  She recently is working as a Conservation officer, nature in a store but has since applied for disability.  Patient had a fall last Thursday (08/20/2017) where she was carrying a close basket down a flight of stairs being unable to hold onto the railing and this resulted in a fall.  She did hit her head as she has a friction burn on the left side of her face above her eye.  She denies neurological changes such as weakness, numbness, headache, or vision changes since this fall.  Denies new or worsening stroke/TIA symptoms.  ROS:   14 system review of systems performed and negative with exception of dizziness  PMH:  Past Medical History:  Diagnosis Date  .  Anemia   . Antithrombin III deficiency (HCC)    ?pt not sure if true diagnosis  . Anxiety   . Apical mural thrombus   . Blood transfusion   . Cholecystitis 07/2016  . Coronary artery disease    a. apical LAD infarction '00. b. NSTEMI s/p BMS to prox LAD '09. c. Cath 01/2015: stable LAD stent, otherwise minimal nonobstructive CAD.  Marland Kitchen GERD (gastroesophageal reflux disease)   . Hyperlipidemia   . Hypertension   . Myocardial infarction (HCC)   . Noncompliance with medication regimen    a. h/o  noncompliance with med regimen (previous running out of Coumadin).  . Stroke (HCC)    assoc with short term memory loss and right peripheral vision loss; age 47   . TIA (transient ischemic attack) 2010  . Tobacco abuse     PSH:  Past Surgical History:  Procedure Laterality Date  . CARDIAC CATHETERIZATION    . CARDIAC CATHETERIZATION N/A 01/29/2015   Procedure: Left Heart Cath and Coronary Angiography;  Surgeon: Peter M Swaziland, MD;  Location: [facility] INVASIVE CV LAB;  Service: Cardiovascular;  Laterality: N/A;  . CHOLECYSTECTOMY N/A 08/14/2016   Procedure: LAPAROSCOPIC CHOLECYSTECTOMY;  Surgeon: Gaynelle Adu, MD;  Location: [facility] OR;  Service: General;  Laterality: N/A;  . COLONOSCOPY N/A 03/29/2014   Procedure: COLONOSCOPY;  Surgeon: Louis Meckel, MD;  Location: [facility] ENDOSCOPY;  Service: Endoscopy;  Laterality: N/A;  . CORONARY ANGIOPLASTY    . ESOPHAGOGASTRODUODENOSCOPY N/A 06/09/2012   Procedure: ESOPHAGOGASTRODUODENOSCOPY (EGD);  Surgeon: Theda Belfast, MD;  Location: [facility] ENDOSCOPY;  Service: Endoscopy;  Laterality: N/A;  . ESOPHAGOGASTRODUODENOSCOPY N/A 08/02/2013   Procedure: ESOPHAGOGASTRODUODENOSCOPY (EGD);  Surgeon: Meryl Dare, MD;  Location: [facility] ENDOSCOPY;  Service: Endoscopy;  Laterality: N/A;  . IR ANGIO INTRA EXTRACRAN SEL COM CAROTID INNOMINATE UNI R MOD SED  07/08/2017  . LEFT HEART CATHETERIZATION WITH CORONARY ANGIOGRAM N/A 12/24/2011   Procedure: LEFT HEART CATHETERIZATION WITH CORONARY ANGIOGRAM;  Surgeon: Kathleene Hazel, MD;  Location: [facility] CATH LAB;  Service: Cardiovascular;  Laterality: N/A;  . RADIOLOGY WITH ANESTHESIA N/A 07/08/2017   Procedure: IR WITH ANESTHESIA;  Surgeon: Radiologist, Medication, MD;  Location: [facility] OR;  Service: Radiology;  Laterality: N/A;  . TUBAL LIGATION      Social History:  Social History   Socioeconomic History  . Marital status: Divorced    Spouse name: Not on file  . Number of children: 2  . Years of education: Not on file  .  Highest education level: Not on file  Occupational History  . Occupation: Geneticist, molecular for [employer]  Social Needs  . Financial resource strain: Not on file  . Food insecurity:    Worry: Not on file    Inability: Not on file  . Transportation needs:    Medical: Not on file    Non-medical: Not on file  Tobacco Use  . Smoking status: Former Smoker    Packs/day: 0.20    Years: 1.00    Pack years: 0.20    Types: Cigarettes  . Smokeless tobacco: Never Used  . Tobacco comment: 2018  " i STILL SMOKE 3  CIGARETTES A DAY "  Substance and Sexual Activity  . Alcohol use: Yes    Alcohol/week: 0.0 oz    Comment: Social drinker  . Drug use: Not Currently    Types: Cocaine    Comment: hx of use x 1 in the past  . Sexual activity: Never    Birth control/protection:  Surgical  Lifestyle  . Physical activity:    Days per week: Not on file    Minutes per session: Not on file  . Stress: Not on file  Relationships  . Social connections:    Talks on phone: Not on file    Gets together: Not on file    Attends religious service: Not on file    Active member of club or organization: Not on file    Attends meetings of clubs or organizations: Not on file    Relationship status: Not on file  . Intimate partner violence:    Fear of current or ex partner: Not on file    Emotionally abused: Not on file    Physically abused: Not on file    Forced sexual activity: Not on file  Other Topics Concern  . Not on file  Social History Narrative  . Not on file    Family History:  Family History  Problem Relation Age of Onset  . Stroke Father   . Heart disease Brother        arrhythmia; died  . Breast cancer Maternal Aunt     Medications:   Current Outpatient Medications on File Prior to Visit  Medication Sig Dispense Refill  . acetaminophen (TYLENOL) 325 MG tablet Take 1-2 tablets (325-650 mg total) by mouth every 4 (four) hours as needed for mild pain.    Marland Kitchen amLODipine (NORVASC)  10 MG tablet Take 10 mg by mouth daily.  3  . atorvastatin (LIPITOR) 40 MG tablet Take 1 tablet (40 mg total) by mouth daily. 30 tablet 0  . carvedilol (COREG) 25 MG tablet TAKE 1 TABLET BY MOUTH TWICE DAILY WITH MEALS 60 tablet 0  . hydrochlorothiazide (HYDRODIURIL) 25 MG tablet Take 25 mg by mouth daily.  3  . nitroGLYCERIN (NITROSTAT) 0.4 MG SL tablet Place 1 tablet (0.4 mg total) under the tongue every 5 (five) minutes as needed for chest pain (up to 3 doses). 15 tablet 0  . pantoprazole (PROTONIX) 20 MG tablet Take 20 mg by mouth daily.    Marland Kitchen topiramate (TOPAMAX) 25 MG tablet TAKE 1 TABLET BY MOUTH TWICE DAILY 60 tablet 0  . vitamin B-12 (CYANOCOBALAMIN) 1000 MCG tablet Take 1 tablet (1,000 mcg total) by mouth daily. 30 tablet 0  . XARELTO 20 MG TABS tablet TAKE 1 TABLET BY MOUTH ONCE DAILY WITH  SUPPER 30 tablet 0   No current facility-administered medications on file prior to visit.     Allergies:  No Known Allergies   Physical Exam  Vitals:   08/24/17 1404  BP: 110/78  Pulse: 66  Weight: 168 lb (76.2 kg)  Height: 5\' 5"  (1.651 m)   Body mass index is 27.96 kg/m. No exam data present  General: well developed, pleasant middle-aged African-American female, well nourished, seated, in no evident distress Head: head normocephalic and atraumatic.   Neck: supple with no carotid or supraclavicular bruits Cardiovascular: regular rate and rhythm, no murmurs Musculoskeletal: no deformity Skin: Friction burn left side of face due to recent fall Vascular:  Normal pulses all extremities  Neurologic Exam Mental Status: Awake and fully alert.  Mild dysarthria oriented to place and time. Recent and remote memory intact. Attention span, concentration and fund of knowledge appropriate. Mood and affect appropriate.  Cranial Nerves: Fundoscopic exam reveals sharp disc margins. Pupils equal, briskly reactive to light. Extraocular movements full without nystagmus. Visual fields full to  confrontation. Hearing intact. Facial sensation intact.  Mild left lower  facial droop Motor: Normal bulk and tone. Normal strength in all tested extremity muscles.  Very mild left upper extremity weakness Sensory.: intact to touch , pinprick , position and vibratory sensation.  Coordination: Rapid alternating movements normal in all extremities. Finger-to-nose and heel-to-shin performed accurately bilaterally.  Decreased left finger dexterity Gait and Station: Arises from chair without difficulty. Stance is normal. Gait demonstrates normal stride length and balance . Able to heel, toe and tandem walk without difficulty.  Reflexes: 1+ and symmetric. Toes downgoing.    NIHSS  1 Modified Rankin  1    Diagnostic Data (Labs, Imaging, Testing)  CT HEAD WO CONTRAST 07/08/2017 IMPRESSION: 1. No acute intracranial infarct or other process identified. 2. ASPECTS is 10. 3. Remote left PCA territory infarct.  CT ANGIO HEAD W OR WO CONTRAST CT ANGIO NECK W OR WO CONTRAST 07/08/2017 IMPRESSION: 1. Patent carotid and vertebral arteries. No dissection, aneurysm, or hemodynamically significant stenosis utilizing NASCET criteria. 2. Right M2 superior division proximal occlusion. 3. Otherwise patent anterior and posterior intracranial circulation. No additional large vessel occlusion, aneurysm, or significant Stenosis.  IR ANGIO CEREBRAL 07/08/2017 IMPRESSION: Occluded superior division of the right middle cerebral artery in its mid 1/3. Extensive collaterals from the right pericallosal and the lateral lenticulostriate branches supplying the perisylvian triangle. The findings were reviewed with the referring neurologist. Given the patient demonstrated significant clinical improvement in her speech, motor function and her facial droop, and also the fact the patient has been compliant on her Xarelto, and the potential risk of worsening neurological function with endovascular treatment, it was  elected not to pursue with endovascular intra-arterial treatment at this time.  CT HEAD WO CONTRAST (REPEAT) 07/08/2017 IMPRESSION: Developing loss of gray-Kozakiewicz differentiation in the right anterolateral frontal lobe, frontal operculum, and insula. ASPECTS is 7 at this time. No mass effect or hemorrhage.  MR BRAIN WO CONTRAST 07/09/17 IMPRESSION: 1. Right lateral frontal lobe and insula acute/early subacute infarction, approximately 32 cc, similar in distribution to prior CT of head given differences in technique. No hemorrhage or mass effect. 2. Otherwise unremarkable MRI of the brain.    ASSESSMENT: CYLINDA SANTOLI is a 47 y.o. year old female here with right MCA on 07/08/2017 secondary to right M2 occlusion likely due to Xarelto related coagulopathy versus LV thrombus with noncompliance of anticoagulation. Vascular risk factors include HTN and HLD.     PLAN: -Continue clopidogrel 75 mg daily and Xarelto (rivaroxaban) daily  and Lipitor for secondary stroke prevention -f/u with cardiologist regarding Xarelto and Plavix management -F/u with PCP regarding your HLD and HTN management -continue to monitor BP at home -Continue PT/OT/ST -Advised patient that due to being on Xarelto and Plavix, she is at increased risk for brain hemorrhage and that if she is to follow again, it is advised that she be seen in ED to rule out brain hemorrhage.  As patient is neurologically stable without new or worsening deficits, imaging is not needed at this time for previous fall -Advised patient importance of compliance of both Xarelto and Plavix -as patient recently ran out of Plavix, refill provided but advised patient that she should schedule follow-up appointment with cardiologist to continue management of these medications -Maintain strict control of hypertension with blood pressure goal below 130/90, diabetes with hemoglobin A1c goal below 6.5% and cholesterol with LDL cholesterol (bad cholesterol)  goal below 70 mg/dL. I also advised the patient to eat a healthy diet with plenty of whole grains, cereals, fruits and vegetables, exercise regularly  and maintain ideal body weight.  Follow up in 4 months or call earlier if needed   Greater than 50% of time during this 25 minute visit was spent on counseling,explanation of diagnosis of right MCA, reviewing risk factor management of HLD and HTN, medication compliance, planning of further management, discussion with patient and family and coordination of care    George Hugh, [facility]  [facility] [address] [place], [place] [address]  Phone [phone] Fax [phone]

## 2017-08-24 NOTE — Progress Notes (Signed)
I agree with the above plan 

## 2017-08-24 NOTE — Patient Instructions (Signed)
Continue clopidogrel 75 mg daily and Xarelto (rivaroxaban) daily  and lipitor  for secondary stroke prevention  Continue to follow up with PCP regarding choelsterol and blood pressure management   Continue to follow up with cardiologist for Xarelto and Plavix management  Continue to monitor blood pressure at home  Maintain strict control of hypertension with blood pressure goal below 130/90, diabetes with hemoglobin A1c goal below 6.5% and cholesterol with LDL cholesterol (bad cholesterol) goal below 70 mg/dL. I also advised the patient to eat a healthy diet with plenty of whole grains, cereals, fruits and vegetables, exercise regularly and maintain ideal body weight.  Followup in the future with me in 4 month or call earlier if needed       Thank you for coming to see [place] at [facility]. I hope we have been able to provide you high quality care today.  You may receive a patient satisfaction survey over the next few weeks. We would appreciate your feedback and comments so that we may continue to improve ourselves and the health of our patients.

## 2017-08-25 ENCOUNTER — Encounter: Payer: Medicaid Other | Admitting: Occupational Therapy

## 2017-08-25 ENCOUNTER — Ambulatory Visit: Payer: Medicaid Other | Admitting: Physical Therapy

## 2017-08-26 ENCOUNTER — Ambulatory Visit: Payer: Medicare Other

## 2017-08-26 DIAGNOSIS — R471 Dysarthria and anarthria: Secondary | ICD-10-CM | POA: Diagnosis not present

## 2017-08-26 DIAGNOSIS — R41841 Cognitive communication deficit: Secondary | ICD-10-CM

## 2017-08-26 DIAGNOSIS — R261 Paralytic gait: Secondary | ICD-10-CM | POA: Diagnosis not present

## 2017-08-26 DIAGNOSIS — I69352 Hemiplegia and hemiparesis following cerebral infarction affecting left dominant side: Secondary | ICD-10-CM | POA: Diagnosis not present

## 2017-08-26 DIAGNOSIS — M6281 Muscle weakness (generalized): Secondary | ICD-10-CM | POA: Diagnosis not present

## 2017-08-26 DIAGNOSIS — R2681 Unsteadiness on feet: Secondary | ICD-10-CM | POA: Diagnosis not present

## 2017-08-26 NOTE — Patient Instructions (Signed)
  Please complete the assigned speech therapy homework prior to your next session and return it to the speech therapist at your next visit.  

## 2017-08-26 NOTE — Therapy (Signed)
[facility] [address] [place], [place], [address] Phone: [phone]   Fax:  [phone]  Speech Language Pathology Treatment  Patient Details  Name: Lindsey [name] MRN: [id number] Date of Birth: 1970/09/20 Referring Provider: Dr. Claudette Laws   Encounter Date: 08/26/2017  End of Session - 08/26/17 1316    Visit Number  6    Number of Visits  9    Date for SLP Re-Evaluation  10/20/17    Authorization Type  - 8 visits through 09/14/17    Authorization - Visit Number  6    Authorization - Number of Visits  8    SLP Start Time  1103    SLP Stop Time   1145    SLP Time Calculation (min)  42 min    Activity Tolerance  Patient tolerated treatment well       Past Medical History:  Diagnosis Date  . Anemia   . Antithrombin III deficiency (HCC)    ?pt not sure if true diagnosis  . Anxiety   . Apical mural thrombus   . Blood transfusion   . Cholecystitis 07/2016  . Coronary artery disease    a. apical LAD infarction '00. b. NSTEMI s/p BMS to prox LAD '09. c. Cath 01/2015: stable LAD stent, otherwise minimal nonobstructive CAD.  Marland Kitchen GERD (gastroesophageal reflux disease)   . Hyperlipidemia   . Hypertension   . Myocardial infarction (HCC)   . Noncompliance with medication regimen    a. h/o noncompliance with med regimen (previous running out of Coumadin).  . Stroke (HCC)    assoc with short term memory loss and right peripheral vision loss; age 65   . TIA (transient ischemic attack) 2010  . Tobacco abuse     Past Surgical History:  Procedure Laterality Date  . CARDIAC CATHETERIZATION    . CARDIAC CATHETERIZATION N/A 01/29/2015   Procedure: Left Heart Cath and Coronary Angiography;  Surgeon: Peter M Swaziland, MD;  Location: [facility] INVASIVE CV LAB;  Service: Cardiovascular;  Laterality: N/A;  . CHOLECYSTECTOMY N/A 08/14/2016   Procedure: LAPAROSCOPIC CHOLECYSTECTOMY;  Surgeon: Gaynelle Adu, MD;  Location: [facility] OR;   Service: General;  Laterality: N/A;  . COLONOSCOPY N/A 03/29/2014   Procedure: COLONOSCOPY;  Surgeon: Louis Meckel, MD;  Location: [facility] ENDOSCOPY;  Service: Endoscopy;  Laterality: N/A;  . CORONARY ANGIOPLASTY    . ESOPHAGOGASTRODUODENOSCOPY N/A 06/09/2012   Procedure: ESOPHAGOGASTRODUODENOSCOPY (EGD);  Surgeon: Theda Belfast, MD;  Location: [facility] ENDOSCOPY;  Service: Endoscopy;  Laterality: N/A;  . ESOPHAGOGASTRODUODENOSCOPY N/A 08/02/2013   Procedure: ESOPHAGOGASTRODUODENOSCOPY (EGD);  Surgeon: Meryl Dare, MD;  Location: [facility] ENDOSCOPY;  Service: Endoscopy;  Laterality: N/A;  . IR ANGIO INTRA EXTRACRAN SEL COM CAROTID INNOMINATE UNI R MOD SED  07/08/2017  . LEFT HEART CATHETERIZATION WITH CORONARY ANGIOGRAM N/A 12/24/2011   Procedure: LEFT HEART CATHETERIZATION WITH CORONARY ANGIOGRAM;  Surgeon: Kathleene Hazel, MD;  Location: [facility] CATH LAB;  Service: Cardiovascular;  Laterality: N/A;  . RADIOLOGY WITH ANESTHESIA N/A 07/08/2017   Procedure: IR WITH ANESTHESIA;  Surgeon: Radiologist, Medication, MD;  Location: [facility] OR;  Service: Radiology;  Laterality: N/A;  . TUBAL LIGATION      There were no vitals filed for this visit.  Subjective Assessment - 08/26/17 1112    Subjective  "I just threw the clothes basket in front of me - I oculd feel (the fall) coming."    Currently in Pain?  No/denies  ADULT SLP TREATMENT - 08/26/17 1114      General Information   Behavior/Cognition  Alert;Cooperative;Pleasant mood      Treatment Provided   Treatment provided  Cognitive-Linquistic      Cognitive-Linquistic Treatment   Treatment focused on  Cognition    Skilled Treatment  Pt told SLP she is keeping a basket by the door for wallet, keys, phone, papers needed (therapy folder). SLP assisted pt with mod cues occasionally with Deductive REasoning #1 (language, job, sport). SLP explained/educated pt on why she was completing puzzles - to rehab skills necessary for reasoing outside the  clinic and SLP provided examples of real-life scenarios for pt in which reasoning would be necessary. SLP asked pt who she had for PT and pt did not have schedule in her folder - SLP encouraged pt to have front office print one for her and to put in her yellow folder.       Assessment / Recommendations / Plan   Plan  Continue with current plan of care      Progression Toward Goals   Progression toward goals  Progressing toward goals       SLP Education - 08/26/17 1315    Education provided  Yes    Education Details  organization, reasoning skills necessary for home and being worked on here by puzzles, Banker) Educated  Patient    Methods  Explanation    Comprehension  Verbalized understanding       SLP Short Term Goals - 08/26/17 1318      SLP SHORT TERM GOAL #1   Title  Pt will complete HEP for dysarthria with rare min A.    Baseline  Pt is not completing exercises for dysarthria    Status  Achieved      SLP SHORT TERM GOAL #2   Title  Pt will solve functional money problems with rare min A.,    Baseline  Pt could not solve simple money problems on eval     Time  1    Period  Weeks    Status  On-going      SLP SHORT TERM GOAL #3   Title  Pt will alternate attention between 2 simple cognitive lingusitic tasks with 85% on each and rare min A    Baseline  Pt attention level is selective at eval    Time  1    Period  Weeks    Status  On-going      SLP SHORT TERM GOAL #4   Title  Pt will use external aids for daily recall and schedule management    Baseline  Pt is not uisng external aids to help her remember or attend    Time  1    Period  Weeks    Status  On-going       SLP Long Term Goals - 08/26/17 1318      SLP LONG TERM GOAL #1   Title  Pt will balance mildly complex money account with rare min A    Baseline  Pt unable to do simple check balancing at eval    Time  4    Period  Weeks    Status  On-going      SLP LONG TERM GOAL #2   Title  Pt will  use external aids/system to manage her home financial management over 2 sessions     Baseline  Her mother is currently managing her finances  Time  4    Period  Weeks    Status  On-going      SLP LONG TERM GOAL #3   Title  Pt will alternate attention between 2 mildly complex cognitive linguistic tasks with 85% on each and occasional min A    Baseline  Attention level is selective at eval    Time  4    Period  Weeks    Status  On-going       Plan - 08/26/17 1317    Clinical Impression Statement  Pt required SLP A and extended time for simple reasoning today in deductie reasonning tasks. Pt's speech remains 100% intelligible for this clinician, however mild lt labial droop is still apparent. Continue skilled ST to maximize cognition for possible return to work.    Speech Therapy Frequency  2x / week    Treatment/Interventions  SLP instruction and feedback;Cueing hierarchy;Oral motor exercises;Cognitive reorganization;Functional tasks;Compensatory strategies;Patient/family education;Internal/external aids;Environmental controls    Potential to Achieve Goals  Good       Patient will benefit from skilled therapeutic intervention in order to improve the following deficits and impairments:   Cognitive communication deficit  Dysarthria and anarthria    Problem List Patient Active Problem List   Diagnosis Date Noted  . Acute blood loss anemia   . Vascular headache   . Hemiparesis affecting left side as late effect of stroke (HCC)   . Tobacco use disorder 07/13/2017  . Noncompliance with medication regimen 07/13/2017  . Acute ischemic right MCA stroke (HCC) 07/13/2017  . Benign essential HTN   . Stroke (cerebrum) (HCC) 07/08/2017  . Middle cerebral artery embolism, right 07/08/2017  . Vitamin B12 deficiency 08/15/2016  . Acute cholecystitis 08/14/2016  . GERD (gastroesophageal reflux disease) 04/24/2015  . Hypertensive urgency 04/23/2015  . Chest pain, unspecified   . Right leg  swelling 03/07/2015  . Preventative health care 02/21/2015  . Esophageal reflux   . NSTEMI (non-ST elevated myocardial infarction) (HCC) 01/28/2015  . Nausea without vomiting 12/22/2014  . Dysuria 12/21/2014  . Diarrhea 09/26/2014  . Difficulty swallowing pills 09/26/2014  . Hemorrhoids 06/16/2014  . Elevated AST (SGOT) 03/27/2014  . Lumbar herniated disc 03/27/2014  . TIA (transient ischemic attack) 02/19/2014  . History of ischemic left PCA stroke 02/19/2014  . Palpitations 01/27/2014  . Depression 08/15/2013  . Long term current use of anticoagulant therapy 08/10/2013  . Gastritis and duodenitis 08/03/2013  . Diverticulosis 08/03/2013  . Fatty liver 08/03/2013  . Esophagitis 08/03/2013  . Hiatal hernia 08/03/2013  . Apical mural thrombus 08/02/2013  . GI bleeding 06/04/2012  . Hypokalemia 06/04/2012  . Antithrombin III deficiency (HCC) 06/04/2012  . Paresthesias 06/29/2011  . Hyperlipemia 06/07/2009  . Essential hypertension 05/17/2009  . OBESITY-MORBID (>100') 03/13/2008  . CAD S/P  BMS to proximal LAD 2009-patent 01/29/15 06/28/2007    [facility] ,MS, CCC-SLP  08/26/2017, 1:20 PM  [facility] [address] [place], [place], [address] Phone: [phone]   Fax:  [phone]   Name: Lindsey [name] MRN: [id number] Date of Birth: 1970/06/29

## 2017-08-27 ENCOUNTER — Encounter: Payer: Self-pay | Admitting: Speech Pathology

## 2017-08-27 ENCOUNTER — Ambulatory Visit: Payer: Medicare Other | Admitting: Speech Pathology

## 2017-08-27 DIAGNOSIS — R2681 Unsteadiness on feet: Secondary | ICD-10-CM | POA: Diagnosis not present

## 2017-08-27 DIAGNOSIS — R41841 Cognitive communication deficit: Secondary | ICD-10-CM | POA: Diagnosis not present

## 2017-08-27 DIAGNOSIS — I69352 Hemiplegia and hemiparesis following cerebral infarction affecting left dominant side: Secondary | ICD-10-CM | POA: Diagnosis not present

## 2017-08-27 DIAGNOSIS — M6281 Muscle weakness (generalized): Secondary | ICD-10-CM | POA: Diagnosis not present

## 2017-08-27 DIAGNOSIS — R261 Paralytic gait: Secondary | ICD-10-CM | POA: Diagnosis not present

## 2017-08-27 DIAGNOSIS — R471 Dysarthria and anarthria: Secondary | ICD-10-CM | POA: Diagnosis not present

## 2017-08-28 NOTE — Therapy (Signed)
[facility] [address] [place], [place], [address] Phone: [phone]   Fax:  [phone]  Speech Language Pathology Treatment  Patient Details  Name: Lindsey [name] MRN: [id number] Date of Birth: [birth year] Referring Provider: Dr. Claudette Laws   Encounter Date: 08/27/2017  End of Session - 08/28/17 0706    Visit Number  7    Number of Visits  9    Date for SLP Re-Evaluation  10/20/17    Authorization Type  - 8 visits through 09/14/17    Authorization - Visit Number  6    Authorization - Number of Visits  8    SLP Start Time  1017    SLP Stop Time   1103    SLP Time Calculation (min)  46 min    Activity Tolerance  Patient tolerated treatment well       Past Medical History:  Diagnosis Date  . Anemia   . Antithrombin III deficiency (HCC)    ?pt not sure if true diagnosis  . Anxiety   . Apical mural thrombus   . Blood transfusion   . Cholecystitis 07/2016  . Coronary artery disease    a. apical LAD infarction '00. b. NSTEMI s/p BMS to prox LAD '09. c. Cath 01/2015: stable LAD stent, otherwise minimal nonobstructive CAD.  Marland Kitchen GERD (gastroesophageal reflux disease)   . Hyperlipidemia   . Hypertension   . Myocardial infarction (HCC)   . Noncompliance with medication regimen    a. h/o noncompliance with med regimen (previous running out of Coumadin).  . Stroke (HCC)    assoc with short term memory loss and right peripheral vision loss; age 26   . TIA (transient ischemic attack) 2010  . Tobacco abuse     Past Surgical History:  Procedure Laterality Date  . CARDIAC CATHETERIZATION    . CARDIAC CATHETERIZATION N/A 01/29/2015   Procedure: Left Heart Cath and Coronary Angiography;  Surgeon: Peter M Swaziland, MD;  Location: [facility] INVASIVE CV LAB;  Service: Cardiovascular;  Laterality: N/A;  . CHOLECYSTECTOMY N/A 08/14/2016   Procedure: LAPAROSCOPIC CHOLECYSTECTOMY;  Surgeon: Gaynelle Adu, MD;  Location: [facility] OR;   Service: General;  Laterality: N/A;  . COLONOSCOPY N/A 03/29/2014   Procedure: COLONOSCOPY;  Surgeon: Louis Meckel, MD;  Location: [facility] ENDOSCOPY;  Service: Endoscopy;  Laterality: N/A;  . CORONARY ANGIOPLASTY    . ESOPHAGOGASTRODUODENOSCOPY N/A 06/09/2012   Procedure: ESOPHAGOGASTRODUODENOSCOPY (EGD);  Surgeon: Theda Belfast, MD;  Location: [facility] ENDOSCOPY;  Service: Endoscopy;  Laterality: N/A;  . ESOPHAGOGASTRODUODENOSCOPY N/A 08/02/2013   Procedure: ESOPHAGOGASTRODUODENOSCOPY (EGD);  Surgeon: Meryl Dare, MD;  Location: [facility] ENDOSCOPY;  Service: Endoscopy;  Laterality: N/A;  . IR ANGIO INTRA EXTRACRAN SEL COM CAROTID INNOMINATE UNI R MOD SED  07/08/2017  . LEFT HEART CATHETERIZATION WITH CORONARY ANGIOGRAM N/A 12/24/2011   Procedure: LEFT HEART CATHETERIZATION WITH CORONARY ANGIOGRAM;  Surgeon: Kathleene Hazel, MD;  Location: [facility] CATH LAB;  Service: Cardiovascular;  Laterality: N/A;  . RADIOLOGY WITH ANESTHESIA N/A 07/08/2017   Procedure: IR WITH ANESTHESIA;  Surgeon: Radiologist, Medication, MD;  Location: [facility] OR;  Service: Radiology;  Laterality: N/A;  . TUBAL LIGATION      There were no vitals filed for this visit.  Subjective Assessment - 08/27/17 1022    Subjective  "my worst fear came true - I fell down the stairs while carrying a basket - that's why I do these reasoning tasks"    Currently in Pain?  Yes  Pain Score  1     Pain Location  Shoulder    Pain Orientation  Right;Left    Pain Descriptors / Indicators  Sore    Pain Type  Acute pain    Pain Onset  In the past 7 days    Aggravating Factors   none    Pain Relieving Factors  tylenol    Multiple Pain Sites  No            ADULT SLP TREATMENT - 08/27/17 1028      General Information   Behavior/Cognition  Alert;Cooperative;Pleasant mood      Treatment Provided   Treatment provided  Cognitive-Linquistic      Cognitive-Linquistic Treatment   Treatment focused on  Cognition    Skilled Treatment  Pt reports  she was sleepy and had h/a after her fall. Pt was seen by neurology after fall. Pt brought back HW incomplete due to difficulty with homework- completed moderately abstract reasoning with frequent mod A and extended time. Pt reports leaving cabinet doors open when she makes coffee. Memory compensation of using a rubber band around her wrist when making coffee and not removing it until she has closed the cabinet door, then leave the band on the coffee pot for the next morning.  Pt divided attention between auditory/verbal alphabetizing task and complex card sort with extended time, and primarily used alternating attention - 95% on card sort, min A for error awarness on alphabetizing with 85% accuracy. Attention to details in functiotnal math, attending to zeros in multipcation task with extended time and initial cueing/instruction.       Assessment / Recommendations / Plan   Plan  Continue with current plan of care      Progression Toward Goals   Progression toward goals  Progressing toward goals         SLP Short Term Goals - 08/27/17 1106      SLP SHORT TERM GOAL #1   Title  Pt will complete HEP for dysarthria with rare min A.    Baseline  Pt is not completing exercises for dysarthria    Status  Achieved      SLP SHORT TERM GOAL #2   Title  Pt will solve functional money problems with rare min A.,    Baseline  Pt could not solve simple money problems on eval     Time  1    Period  Weeks    Status  On-going      SLP SHORT TERM GOAL #3   Title  Pt will alternate attention between 2 simple cognitive lingusitic tasks with 85% on each and rare min A    Baseline  Pt attention level is selective at eval    Time  1    Period  Weeks    Status  Achieved      SLP SHORT TERM GOAL #4   Title  Pt will use external aids for daily recall and schedule management    Baseline  Pt is not uisng external aids to help her remember or attend    Time  1    Period  Weeks    Status  Achieved        SLP Long Term Goals - 08/28/17 0706      SLP LONG TERM GOAL #1   Title  Pt will balance mildly complex money account with rare min A    Baseline  Pt unable to do simple check balancing at eval  Time  4    Period  Weeks    Status  On-going      SLP LONG TERM GOAL #2   Title  Pt will use external aids/system to manage her home financial management over 2 sessions     Baseline  Her mother is currently managing her finances    Time  4    Period  Weeks    Status  On-going      SLP LONG TERM GOAL #3   Title  Pt will alternate attention between 2 mildly complex cognitive linguistic tasks with 85% on each and occasional min A    Baseline  Attention level is selective at eval    Time  4    Period  Weeks    Status  On-going       Plan - 08/28/17 0706    Clinical Impression Statement  Pt required SLP A and extended time for simple reasoning today in deductie reasonning tasks. Pt's speech remains 100% intelligible for this clinician, however mild lt labial droop is still apparent. Continue skilled ST to maximize cognition for possible return to work.    Speech Therapy Frequency  2x / week    Treatment/Interventions  SLP instruction and feedback;Cueing hierarchy;Oral motor exercises;Cognitive reorganization;Functional tasks;Compensatory strategies;Patient/family education;Internal/external aids;Environmental controls    Potential to Achieve Goals  Good       Patient will benefit from skilled therapeutic intervention in order to improve the following deficits and impairments:   Cognitive communication deficit    Problem List Patient Active Problem List   Diagnosis Date Noted  . Acute blood loss anemia   . Vascular headache   . Hemiparesis affecting left side as late effect of stroke (HCC)   . Tobacco use disorder 07/13/2017  . Noncompliance with medication regimen 07/13/2017  . Acute ischemic right MCA stroke (HCC) 07/13/2017  . Benign essential HTN   . Stroke (cerebrum)  (HCC) 07/08/2017  . Middle cerebral artery embolism, right 07/08/2017  . Vitamin B12 deficiency 08/15/2016  . Acute cholecystitis 08/14/2016  . GERD (gastroesophageal reflux disease) 04/24/2015  . Hypertensive urgency 04/23/2015  . Chest pain, unspecified   . Right leg swelling 03/07/2015  . Preventative health care 02/21/2015  . Esophageal reflux   . NSTEMI (non-ST elevated myocardial infarction) (HCC) 01/28/2015  . Nausea without vomiting 12/22/2014  . Dysuria 12/21/2014  . Diarrhea 09/26/2014  . Difficulty swallowing pills 09/26/2014  . Hemorrhoids 06/16/2014  . Elevated AST (SGOT) 03/27/2014  . Lumbar herniated disc 03/27/2014  . TIA (transient ischemic attack) 02/19/2014  . History of ischemic left PCA stroke 02/19/2014  . Palpitations 01/27/2014  . Depression 08/15/2013  . Long term current use of anticoagulant therapy 08/10/2013  . Gastritis and duodenitis 08/03/2013  . Diverticulosis 08/03/2013  . Fatty liver 08/03/2013  . Esophagitis 08/03/2013  . Hiatal hernia 08/03/2013  . Apical mural thrombus 08/02/2013  . GI bleeding 06/04/2012  . Hypokalemia 06/04/2012  . Antithrombin III deficiency (HCC) 06/04/2012  . Paresthesias 06/29/2011  . Hyperlipemia 06/07/2009  . Essential hypertension 05/17/2009  . OBESITY-MORBID (>100') 03/13/2008  . CAD S/P  BMS to proximal LAD 2009-patent 01/29/15 06/28/2007    Willliam Pettet, Radene Journey MS, CCC-SLP 08/28/2017, 7:08 AM  [facility] [address] [place], [place], [address] Phone: [phone]   Fax:  [phone]   Name: Lindsey [name] MRN: [id number] Date of Birth: 08-01-1970

## 2017-08-31 ENCOUNTER — Encounter: Payer: Medicare Other | Attending: Physical Medicine & Rehabilitation

## 2017-08-31 ENCOUNTER — Encounter: Payer: Self-pay | Admitting: Physical Medicine & Rehabilitation

## 2017-08-31 ENCOUNTER — Ambulatory Visit: Payer: Medicaid Other | Admitting: Physical Medicine & Rehabilitation

## 2017-08-31 ENCOUNTER — Other Ambulatory Visit: Payer: Self-pay

## 2017-08-31 VITALS — BP 103/73 | HR 71 | Ht 65.5 in | Wt 168.2 lb

## 2017-08-31 DIAGNOSIS — Z9889 Other specified postprocedural states: Secondary | ICD-10-CM | POA: Insufficient documentation

## 2017-08-31 DIAGNOSIS — Z9114 Patient's other noncompliance with medication regimen: Secondary | ICD-10-CM | POA: Insufficient documentation

## 2017-08-31 DIAGNOSIS — F17210 Nicotine dependence, cigarettes, uncomplicated: Secondary | ICD-10-CM | POA: Diagnosis not present

## 2017-08-31 DIAGNOSIS — I2510 Atherosclerotic heart disease of native coronary artery without angina pectoris: Secondary | ICD-10-CM | POA: Diagnosis not present

## 2017-08-31 DIAGNOSIS — Z9049 Acquired absence of other specified parts of digestive tract: Secondary | ICD-10-CM | POA: Insufficient documentation

## 2017-08-31 DIAGNOSIS — I252 Old myocardial infarction: Secondary | ICD-10-CM | POA: Diagnosis not present

## 2017-08-31 DIAGNOSIS — I63511 Cerebral infarction due to unspecified occlusion or stenosis of right middle cerebral artery: Secondary | ICD-10-CM | POA: Diagnosis not present

## 2017-08-31 DIAGNOSIS — R269 Unspecified abnormalities of gait and mobility: Secondary | ICD-10-CM

## 2017-08-31 DIAGNOSIS — K219 Gastro-esophageal reflux disease without esophagitis: Secondary | ICD-10-CM | POA: Diagnosis not present

## 2017-08-31 DIAGNOSIS — I69354 Hemiplegia and hemiparesis following cerebral infarction affecting left non-dominant side: Secondary | ICD-10-CM | POA: Diagnosis not present

## 2017-08-31 DIAGNOSIS — I10 Essential (primary) hypertension: Secondary | ICD-10-CM | POA: Diagnosis not present

## 2017-08-31 DIAGNOSIS — G441 Vascular headache, not elsewhere classified: Secondary | ICD-10-CM | POA: Insufficient documentation

## 2017-08-31 DIAGNOSIS — F419 Anxiety disorder, unspecified: Secondary | ICD-10-CM | POA: Insufficient documentation

## 2017-08-31 DIAGNOSIS — I69398 Other sequelae of cerebral infarction: Secondary | ICD-10-CM

## 2017-08-31 DIAGNOSIS — E785 Hyperlipidemia, unspecified: Secondary | ICD-10-CM | POA: Diagnosis not present

## 2017-08-31 DIAGNOSIS — Z9851 Tubal ligation status: Secondary | ICD-10-CM | POA: Insufficient documentation

## 2017-08-31 DIAGNOSIS — Z8249 Family history of ischemic heart disease and other diseases of the circulatory system: Secondary | ICD-10-CM | POA: Diagnosis not present

## 2017-08-31 DIAGNOSIS — Z803 Family history of malignant neoplasm of breast: Secondary | ICD-10-CM | POA: Diagnosis not present

## 2017-08-31 NOTE — Progress Notes (Signed)
Subjective:    Patient ID: Lindsey [name], female    DOB: 1970/04/10, 47 y.o.   MRN: [id number] 47 year old right handed female with history of apical mural thrombus with CVA 2016 and medication non-compliance who was admitted on 07/08/17 with sudden onset of left facial weakness and LUE weakness. CTA brain revealed occluded superior of mid 1/3 division for R-MCA with spontaneous recanalization therefore no endovascular treatment recommended. MRI brain revealed right lateral frontal and insula early/subacute infarct without hemorrhage. 2D echo showed fixed, medium apical thrombus with mild LVH and EF 55-60%. Dr. Pearlean Brownie felt that stroke was embolic due to non-compliance with Xarelto related  Admit date: 07/13/2017 Discharge date: 07/21/2017  HPI   Larey Seat coming down steps, the patient was carrying a Government social research officer.  Patient has had left shoulder pain since that time.  She has had noted bruising.  She did have a rug burn on the left side of her face. She does not use an assistive device to ambulate.  She is independent with all her self-care and mobility.  She has not resumed driving.  She does have a chronic right peripheral field cut and has followed with neuro-ophthalmology.   Pain Inventory Average Pain 3 Pain Right Now 3 My pain is dull and aching  In the last 24 hours, has pain interfered with the following? General activity 0 Relation with others 0 Enjoyment of life 0 What TIME of day is your pain at its worst? morning Sleep (in general) Poor  Pain is worse with: walking Pain improves with: rest Relief from Meds: no meds  Mobility Do you have any goals in this area?  no  Function Do you have any goals in this area?  no  Neuro/Psych No problems in this area  Prior Studies Any changes since last visit?  no  Physicians involved in your care Any changes since last visit?  no   Family History  Problem Relation Age of Onset  . Stroke Father   . Heart disease Brother    arrhythmia; died  . Breast cancer Maternal Aunt    Social History   Socioeconomic History  . Marital status: Divorced    Spouse name: Not on file  . Number of children: 2  . Years of education: Not on file  . Highest education level: Not on file  Occupational History  . Occupation: Geneticist, molecular for [employer]  Social Needs  . Financial resource strain: Not on file  . Food insecurity:    Worry: Not on file    Inability: Not on file  . Transportation needs:    Medical: Not on file    Non-medical: Not on file  Tobacco Use  . Smoking status: Former Smoker    Packs/day: 0.20    Years: 1.00    Pack years: 0.20    Types: Cigarettes  . Smokeless tobacco: Never Used  . Tobacco comment: 2018  " i STILL SMOKE 3  CIGARETTES A DAY "  Substance and Sexual Activity  . Alcohol use: Yes    Alcohol/week: 0.0 oz    Comment: Social drinker  . Drug use: Not Currently    Types: Cocaine    Comment: hx of use x 1 in the past  . Sexual activity: Never    Birth control/protection: Surgical  Lifestyle  . Physical activity:    Days per week: Not on file    Minutes per session: Not on file  . Stress: Not on file  Relationships  . Social connections:    Talks on phone: Not on file    Gets together: Not on file    Attends religious service: Not on file    Active member of club or organization: Not on file    Attends meetings of clubs or organizations: Not on file    Relationship status: Not on file  Other Topics Concern  . Not on file  Social History Narrative  . Not on file   Past Surgical History:  Procedure Laterality Date  . CARDIAC CATHETERIZATION    . CARDIAC CATHETERIZATION N/A 01/29/2015   Procedure: Left Heart Cath and Coronary Angiography;  Surgeon: Peter M Swaziland, MD;  Location: [facility] INVASIVE CV LAB;  Service: Cardiovascular;  Laterality: N/A;  . CHOLECYSTECTOMY N/A 08/14/2016   Procedure: LAPAROSCOPIC CHOLECYSTECTOMY;  Surgeon: Gaynelle Adu, MD;  Location: [facility] OR;   Service: General;  Laterality: N/A;  . COLONOSCOPY N/A 03/29/2014   Procedure: COLONOSCOPY;  Surgeon: Louis Meckel, MD;  Location[facility] ENDOSCOPY;  Service: Endoscopy;  Laterality: N/A;  . CORONARY ANGIOPLASTY    . ESOPHAGOGASTRODUODENOSCOPY N/A 06/09/2012   Procedure: ESOPHAGOGASTRODUODENOSCOPY (EGD);  Surgeon: Theda Belfast, MD;  Location: [facility] ENDOSCOPY;  Service: Endoscopy;  Laterality: N/A;  . ESOPHAGOGASTRODUODENOSCOPY N/A 08/02/2013   Procedure: ESOPHAGOGASTRODUODENOSCOPY (EGD);  Surgeon: Meryl Dare, MD;  Location: [facility] ENDOSCOPY;  Service: Endoscopy;  Laterality: N/A;  . IR ANGIO INTRA EXTRACRAN SEL COM CAROTID INNOMINATE UNI R MOD SED  07/08/2017  . LEFT HEART CATHETERIZATION WITH CORONARY ANGIOGRAM N/A 12/24/2011   Procedure: LEFT HEART CATHETERIZATION WITH CORONARY ANGIOGRAM;  Surgeon: Kathleene Hazel, MD;  Location: [facility] CATH LAB;  Service: Cardiovascular;  Laterality: N/A;  . RADIOLOGY WITH ANESTHESIA N/A 07/08/2017   Procedure: IR WITH ANESTHESIA;  Surgeon: Radiologist, Medication, MD;  Location: [facility] OR;  Service: Radiology;  Laterality: N/A;  . TUBAL LIGATION     Past Medical History:  Diagnosis Date  . Anemia   . Antithrombin III deficiency (HCC)    ?pt not sure if true diagnosis  . Anxiety   . Apical mural thrombus   . Blood transfusion   . Cholecystitis 07/2016  . Coronary artery disease    a. apical LAD infarction '00. b. NSTEMI s/p BMS to prox LAD '09. c. Cath 01/2015: stable LAD stent, otherwise minimal nonobstructive CAD.  Marland Kitchen GERD (gastroesophageal reflux disease)   . Hyperlipidemia   . Hypertension   . Myocardial infarction (HCC)   . Noncompliance with medication regimen    a. h/o noncompliance with med regimen (previous running out of Coumadin).  . Stroke (HCC)    assoc with short term memory loss and right peripheral vision loss; age 65   . TIA (transient ischemic attack) 2010  . Tobacco abuse    BP 103/73   Pulse 71   Ht 5' 5.5" (1.664 m)   Wt 168 lb  3.2 oz (76.3 kg)   SpO2 96%   BMI 27.56 kg/m   Opioid Risk Score:   Fall Risk Score:  `1  Depression screen PHQ 2/9  Depression screen [facility] 2/9 08/31/2017 08/03/2017 03/06/2015 02/20/2015 12/26/2014 09/26/2014 06/16/2014  Decreased Interest 0 1 0 0 0 0 0  Down, Depressed, Hopeless 0 1 0 0 0 3 0  PHQ - 2 Score 0 2 0 0 0 3 0  Altered sleeping - 1 - - - 1 -  Tired, decreased energy - 1 - - - 1 -  Change in appetite - 0 - - -  1 -  Feeling bad or failure about yourself  - 0 - - - 1 -  Trouble concentrating - 0 - - - 1 -  Moving slowly or fidgety/restless - 0 - - - 1 -  Suicidal thoughts - 0 - - - 1 -  PHQ-9 Score - 4 - - - 10 -  Difficult doing work/chores - Not difficult at all - - - - -  Some recent data might be hidden    Review of Systems  Constitutional: Negative.   HENT: Negative.   Eyes: Negative.   Respiratory: Negative.   Cardiovascular: Negative.   Gastrointestinal: Negative.   Endocrine: Negative.   Genitourinary: Negative.   Musculoskeletal: Negative.   Skin: Negative.   Allergic/Immunologic: Negative.   Neurological: Negative.   Hematological: Negative.   Psychiatric/Behavioral: Negative.   All other systems reviewed and are negative.      Objective:   Physical Exam  Constitutional: She is oriented to person, place, and time. She appears well-developed and well-nourished. No distress.  HENT:  Head: Normocephalic and atraumatic.  Eyes: Pupils are equal, round, and reactive to light. EOM are normal.  Neck: Normal range of motion.  Neurological: She is alert and oriented to person, place, and time. She displays no tremor. She exhibits normal muscle tone. She displays no seizure activity. Coordination abnormal. Gait normal.  Motor strength is 4/5 in the left deltoid bicep tricep grip 5/5 in the right deltoid bicep  Sensation reported as equal bilateral upper and lower limbs  Extraocular muscles are intact Visual fields are intact confrontation testing No  evidence of ptosis There is left central 7 palsy  Mild fine motor deficit left upper extremity  Skin: Skin is warm and dry. She is not diaphoretic.  Psychiatric: She has a normal mood and affect.  Nursing note and vitals reviewed.         Assessment & Plan:  1.  Right MCA distribution infarcts mild residual deficits left upper extremity fine motor as well as gross motor.  She still has difficulties with balance especially higher level.  She will continue with outpatient PT OT for a few more visits. I do not detect any visual field cut, instructed patient follow-up with her neuro-ophthalmologist Dr. Karleen Hampshire to get her vision reevaluated prior to resuming driving.  Return to clinic in approximately 6 weeks.

## 2017-08-31 NOTE — Patient Instructions (Signed)
May take Benadryl (diphenhydramine is the generic) 25mg  at bedtime  If this does not help Call me so I can order trazodone for you.  Don't carry anything on the step, always hold on to rail  See Dr Karleen Hampshire to ask about driving

## 2017-09-01 ENCOUNTER — Ambulatory Visit: Payer: Medicare Other | Admitting: Speech Pathology

## 2017-09-01 ENCOUNTER — Encounter: Payer: Self-pay | Admitting: Speech Pathology

## 2017-09-01 DIAGNOSIS — R41841 Cognitive communication deficit: Secondary | ICD-10-CM | POA: Diagnosis not present

## 2017-09-01 DIAGNOSIS — R261 Paralytic gait: Secondary | ICD-10-CM | POA: Diagnosis not present

## 2017-09-01 DIAGNOSIS — I69352 Hemiplegia and hemiparesis following cerebral infarction affecting left dominant side: Secondary | ICD-10-CM | POA: Diagnosis not present

## 2017-09-01 DIAGNOSIS — R2681 Unsteadiness on feet: Secondary | ICD-10-CM | POA: Diagnosis not present

## 2017-09-01 DIAGNOSIS — R471 Dysarthria and anarthria: Secondary | ICD-10-CM | POA: Diagnosis not present

## 2017-09-01 DIAGNOSIS — M6281 Muscle weakness (generalized): Secondary | ICD-10-CM | POA: Diagnosis not present

## 2017-09-01 NOTE — Therapy (Signed)
[facility] [address] [place], [place], [address] Phone: [phone]   Fax:  [phone]  Speech Language Pathology Treatment  Patient Details  Name: Lindsey [name] MRN: [id number] Date of Birth: 1970/11/13 Referring Provider: Dr. Claudette Laws   Encounter Date: 09/01/2017  End of Session - 09/01/17 1510    Visit Number  8    Number of Visits  9    Date for SLP Re-Evaluation  10/20/17    Authorization Type  - 8 visits through 09/14/17    Authorization - Visit Number  7    Authorization - Number of Visits  8    SLP Start Time  1312    SLP Stop Time   1400    SLP Time Calculation (min)  48 min    Activity Tolerance  Patient tolerated treatment well       Past Medical History:  Diagnosis Date  . Anemia   . Antithrombin III deficiency (HCC)    ?pt not sure if true diagnosis  . Anxiety   . Apical mural thrombus   . Blood transfusion   . Cholecystitis 07/2016  . Coronary artery disease    a. apical LAD infarction '00. b. NSTEMI s/p BMS to prox LAD '09. c. Cath 01/2015: stable LAD stent, otherwise minimal nonobstructive CAD.  Marland Kitchen GERD (gastroesophageal reflux disease)   . Hyperlipidemia   . Hypertension   . Myocardial infarction (HCC)   . Noncompliance with medication regimen    a. h/o noncompliance with med regimen (previous running out of Coumadin).  . Stroke (HCC)    assoc with short term memory loss and right peripheral vision loss; age 65   . TIA (transient ischemic attack) 2010  . Tobacco abuse     Past Surgical History:  Procedure Laterality Date  . CARDIAC CATHETERIZATION    . CARDIAC CATHETERIZATION N/A 01/29/2015   Procedure: Left Heart Cath and Coronary Angiography;  Surgeon: Peter M Swaziland, MD;  Location: [facility] INVASIVE CV LAB;  Service: Cardiovascular;  Laterality: N/A;  . CHOLECYSTECTOMY N/A 08/14/2016   Procedure: LAPAROSCOPIC CHOLECYSTECTOMY;  Surgeon: Gaynelle Adu, MD;  Location: [facility] OR;   Service: General;  Laterality: N/A;  . COLONOSCOPY N/A 03/29/2014   Procedure: COLONOSCOPY;  Surgeon: Louis Meckel, MD;  Location: [facility] ENDOSCOPY;  Service: Endoscopy;  Laterality: N/A;  . CORONARY ANGIOPLASTY    . ESOPHAGOGASTRODUODENOSCOPY N/A 06/09/2012   Procedure: ESOPHAGOGASTRODUODENOSCOPY (EGD);  Surgeon: Theda Belfast, MD;  Location: [facility] ENDOSCOPY;  Service: Endoscopy;  Laterality: N/A;  . ESOPHAGOGASTRODUODENOSCOPY N/A 08/02/2013   Procedure: ESOPHAGOGASTRODUODENOSCOPY (EGD);  Surgeon: Meryl Dare, MD;  Location: [facility] ENDOSCOPY;  Service: Endoscopy;  Laterality: N/A;  . IR ANGIO INTRA EXTRACRAN SEL COM CAROTID INNOMINATE UNI R MOD SED  07/08/2017  . LEFT HEART CATHETERIZATION WITH CORONARY ANGIOGRAM N/A 12/24/2011   Procedure: LEFT HEART CATHETERIZATION WITH CORONARY ANGIOGRAM;  Surgeon: Kathleene Hazel, MD;  Location: [facility] CATH LAB;  Service: Cardiovascular;  Laterality: N/A;  . RADIOLOGY WITH ANESTHESIA N/A 07/08/2017   Procedure: IR WITH ANESTHESIA;  Surgeon: Radiologist, Medication, MD;  Location: [facility] OR;  Service: Radiology;  Laterality: N/A;  . TUBAL LIGATION      There were no vitals filed for this visit.  Subjective Assessment - 09/01/17 1311    Subjective  "I got a little confused with thiss    Currently in Pain?  Yes    Pain Score  2     Pain Location  Hip  Pain Orientation  Left    Pain Descriptors / Indicators  Sharp    Pain Type  Acute pain    Aggravating Factors   walking    Pain Relieving Factors  resting    Multiple Pain Sites  No            ADULT SLP TREATMENT - 09/01/17 1326      General Information   Behavior/Cognition  Alert;Cooperative;Pleasant mood      Treatment Provided   Treatment provided  Cognitive-Linquistic      Cognitive-Linquistic Treatment   Treatment focused on  Cognition    Skilled Treatment  Pt reports internal distractions due to strained relationship with her boyfriend. Moderately complex math reasoning and error  awareness with extended time and occasional min verbal cues for problem solving, reasoning and organizing. She continues to [employer] system to assit her mother who is helping Pamala manage her bills. Macala also uses calendar to write down payment due dates. She paid full balance on credit card, when she meant to make a payment 1x due to reduced attention to detail,. Trained pt in compensation of reading the bill, the circling the amount she is going to pay, prior to writng the payment.,      Assessment / Recommendations / Plan   Plan  Continue with current plan of care      Progression Toward Goals   Progression toward goals  Progressing toward goals         SLP Short Term Goals - 09/01/17 1509      SLP SHORT TERM GOAL #1   Title  Pt will complete HEP for dysarthria with rare min A.    Baseline  Pt is not completing exercises for dysarthria    Status  Achieved      SLP SHORT TERM GOAL #2   Title  Pt will solve functional money problems with rare min A.,    Baseline  Pt could not solve simple money problems on eval     Time  1    Period  Weeks    Status  On-going      SLP SHORT TERM GOAL #3   Title  Pt will alternate attention between 2 simple cognitive lingusitic tasks with 85% on each and rare min A    Baseline  Pt attention level is selective at eval    Time  1    Period  Weeks    Status  Achieved      SLP SHORT TERM GOAL #4   Title  Pt will use external aids for daily recall and schedule management    Baseline  Pt is not uisng external aids to help her remember or attend    Time  1    Period  Weeks    Status  Achieved       SLP Long Term Goals - 09/01/17 1510      SLP LONG TERM GOAL #1   Title  Pt will balance mildly complex money account with rare min A    Baseline  Pt unable to do simple check balancing at eval    Time  4    Period  Weeks    Status  On-going      SLP LONG TERM GOAL #2   Title  Pt will use external aids/system to  manage her home financial management over 2 sessions     Baseline  Her mother is currently managing her finances. 09/01/17;  Time  4    Period  Weeks    Status  On-going      SLP LONG TERM GOAL #3   Title  Pt will alternate attention between 2 mildly complex cognitive linguistic tasks with 85% on each and occasional min A    Baseline  Attention level is selective at eval    Time  4    Period  Weeks    Status  On-going       Plan - 09/01/17 1507    Clinical Impression Statement  Continued improvement in labial symmetry, slight droop noted. She requires min A for altnerating attention and moderately complex reasoning. Melvinia continues to carryover extermal memory strategies and compensations for finanacial mangagement at home. Continue skilled ST 1 more visits (due to medicaid) to maximize cognition for possible return to work and independence in high level ADL's.    Speech Therapy Frequency  2x / week    Duration  -- 2x a week for 3 weeks, 1x a week for 2 weeks    Treatment/Interventions  SLP instruction and feedback;Cueing hierarchy;Oral motor exercises;Cognitive reorganization;Functional tasks;Compensatory strategies;Patient/family education;Internal/external aids;Environmental controls    Potential to Achieve Goals  Good       Patient will benefit from skilled therapeutic intervention in order to improve the following deficits and impairments:   Cognitive communication deficit    Problem List Patient Active Problem List   Diagnosis Date Noted  . Acute blood loss anemia   . Vascular headache   . Hemiparesis affecting left side as late effect of stroke (HCC)   . Tobacco use disorder 07/13/2017  . Noncompliance with medication regimen 07/13/2017  . Acute ischemic right MCA stroke (HCC) 07/13/2017  . Benign essential HTN   . Stroke (cerebrum) (HCC) 07/08/2017  . Middle cerebral artery embolism, right 07/08/2017  . Vitamin B12 deficiency 08/15/2016  . Acute cholecystitis  08/14/2016  . GERD (gastroesophageal reflux disease) 04/24/2015  . Hypertensive urgency 04/23/2015  . Chest pain, unspecified   . Right leg swelling 03/07/2015  . Preventative health care 02/21/2015  . Esophageal reflux   . NSTEMI (non-ST elevated myocardial infarction) (HCC) 01/28/2015  . Nausea without vomiting 12/22/2014  . Dysuria 12/21/2014  . Diarrhea 09/26/2014  . Difficulty swallowing pills 09/26/2014  . Hemorrhoids 06/16/2014  . Elevated AST (SGOT) 03/27/2014  . Lumbar herniated disc 03/27/2014  . TIA (transient ischemic attack) 02/19/2014  . History of ischemic left PCA stroke 02/19/2014  . Palpitations 01/27/2014  . Depression 08/15/2013  . Long term current use of anticoagulant therapy 08/10/2013  . Gastritis and duodenitis 08/03/2013  . Diverticulosis 08/03/2013  . Fatty liver 08/03/2013  . Esophagitis 08/03/2013  . Hiatal hernia 08/03/2013  . Apical mural thrombus 08/02/2013  . GI bleeding 06/04/2012  . Hypokalemia 06/04/2012  . Antithrombin III deficiency (HCC) 06/04/2012  . Paresthesias 06/29/2011  . Hyperlipemia 06/07/2009  . Essential hypertension 05/17/2009  . OBESITY-MORBID (>100') 03/13/2008  . CAD S/P  BMS to proximal LAD 2009-patent 01/29/15 06/28/2007    Lindsey [name], Radene Journey MS, CCC-SLP 09/01/2017, 3:12 PM  [facility] [address] [place], [place], [address] Phone: [phone]   Fax:  [phone]   Name: Lindsey [name] MRN: [id number] Date of Birth: 1970-09-24

## 2017-09-02 ENCOUNTER — Encounter: Payer: Medicaid Other | Admitting: Occupational Therapy

## 2017-09-02 ENCOUNTER — Ambulatory Visit: Payer: Medicare Other | Admitting: Physical Therapy

## 2017-09-02 ENCOUNTER — Encounter: Payer: Self-pay | Admitting: Physical Therapy

## 2017-09-02 DIAGNOSIS — I69352 Hemiplegia and hemiparesis following cerebral infarction affecting left dominant side: Secondary | ICD-10-CM

## 2017-09-02 DIAGNOSIS — R2681 Unsteadiness on feet: Secondary | ICD-10-CM

## 2017-09-02 DIAGNOSIS — R471 Dysarthria and anarthria: Secondary | ICD-10-CM | POA: Diagnosis not present

## 2017-09-02 DIAGNOSIS — M6281 Muscle weakness (generalized): Secondary | ICD-10-CM | POA: Diagnosis not present

## 2017-09-02 DIAGNOSIS — R261 Paralytic gait: Secondary | ICD-10-CM

## 2017-09-02 DIAGNOSIS — R41841 Cognitive communication deficit: Secondary | ICD-10-CM | POA: Diagnosis not present

## 2017-09-03 NOTE — Therapy (Signed)
[facility] [address] [place], [place], [address] Phone: [phone]   Fax:  [phone]  Physical Therapy Treatment  Patient Details  Name: Lindsey [name] MRN: [id number] Date of Birth: 1971/02/21 Referring Provider: Alysia Penna, MD   Encounter Date: 09/02/2017     09/02/17 1335  PT Visits / Re-Eval  Visit Number 5  Number of Visits 7  Authorization  Authorization Type Medicaid  Authorization Time Period 5/20 - 6/9= 3 visits during this time frame; 6/10-6/30= 3 more additional visists authorized  Authorization - Visit Number 4  Authorization - Number of Visits 6  PT Time Calculation  PT Start Time 1319  PT Stop Time 1400  PT Time Calculation (min) 41 min  PT - End of Session  Equipment Utilized During Treatment Gait belt  Activity Tolerance Patient tolerated treatment well  Behavior During Therapy [facility] for tasks assessed/performed    Past Medical History:  Diagnosis Date  . Anemia   . Antithrombin III deficiency ([place])    ?pt not sure if true diagnosis  . Anxiety   . Apical mural thrombus   . Blood transfusion   . Cholecystitis 07/2016  . Coronary artery disease    a. apical LAD infarction '00. b. NSTEMI s/p BMS to prox LAD '09. c. Cath 01/2015: stable LAD stent, otherwise minimal nonobstructive CAD.  Marland Kitchen GERD (gastroesophageal reflux disease)   . Hyperlipidemia   . Hypertension   . Myocardial infarction ([place])   . Noncompliance with medication regimen    a. h/o noncompliance with med regimen (previous running out of Coumadin).  . Stroke ([place])    assoc with short term memory loss and right peripheral vision loss; age 38   . TIA (transient ischemic attack) 2010  . Tobacco abuse     Past Surgical History:  Procedure Laterality Date  . CARDIAC CATHETERIZATION    . CARDIAC CATHETERIZATION N/A 01/29/2015   Procedure: Left Heart Cath and Coronary Angiography;  Surgeon: Peter M Martinique, MD;   Location: [facility] CV LAB;  Service: Cardiovascular;  Laterality: N/A;  . CHOLECYSTECTOMY N/A 08/14/2016   Procedure: LAPAROSCOPIC CHOLECYSTECTOMY;  Surgeon: Greer Pickerel, MD;  Location: [facility];  Service: General;  Laterality: N/A;  . COLONOSCOPY N/A 03/29/2014   Procedure: COLONOSCOPY;  Surgeon: Inda Castle, MD;  Location: [facility];  Service: Endoscopy;  Laterality: N/A;  . CORONARY ANGIOPLASTY    . ESOPHAGOGASTRODUODENOSCOPY N/A 06/09/2012   Procedure: ESOPHAGOGASTRODUODENOSCOPY (EGD);  Surgeon: Beryle Beams, MD;  Location: [facility];  Service: Endoscopy;  Laterality: N/A;  . ESOPHAGOGASTRODUODENOSCOPY N/A 08/02/2013   Procedure: ESOPHAGOGASTRODUODENOSCOPY (EGD);  Surgeon: Ladene Artist, MD;  Location: [facility] ENDOSCOPY;  Service: Endoscopy;  Laterality: N/A;  . IR ANGIO INTRA EXTRACRAN SEL COM CAROTID INNOMINATE UNI R MOD SED  07/08/2017  . LEFT HEART CATHETERIZATION WITH CORONARY ANGIOGRAM N/A 12/24/2011   Procedure: LEFT HEART CATHETERIZATION WITH CORONARY ANGIOGRAM;  Surgeon: Burnell Blanks, MD;  Location: [facility] CATH LAB;  Service: Cardiovascular;  Laterality: N/A;  . RADIOLOGY WITH ANESTHESIA N/A 07/08/2017   Procedure: IR WITH ANESTHESIA;  Surgeon: Radiologist, Medication, MD;  Location: [facility];  Service: Radiology;  Laterality: N/A;  . TUBAL LIGATION      There were no vitals filed for this visit.   09/02/17 1322  Symptoms/Limitations  Subjective Had a fall down the stairs, missed a step and fell forward down 6-7 steps. Was carrying her laundry baseket down and lost her balance. Hit her head with  carpet burn on side of her face and hit her shoulder. Still with shoulder pain. Had a slight headache as well for a few days. Saw the NP since then and Dr. Letta Pate. He feels she may have bruised her shoulder with the fall.   Pertinent History R CVA 07/08/17 & hx of ischemic left PCA stroke, HTN, NSTEMI, CAD, MI, TIA,  Limitations Lifting;Walking;House hold activities;Standing   Patient Stated Goals walking, stairs, getting in/out shower, no falls,   Pain Assessment  Currently in Pain? Yes  Pain Score 2  Pain Location Hip  Pain Orientation Left  Pain Descriptors / Indicators Sharp  Pain Type Acute pain  Pain Onset In the past 7 days  Pain Frequency Constant  Aggravating Factors  walking, unknown cause as she woke up with it  Pain Relieving Factors resting  Multiple Pain Sites Yes  2nd Pain Site  Pain Score 1  Pain Location Shoulder  Pain Orientation Left  Pain Descriptors / Indicators Dull  Pain Type Acute pain  Pain Onset In the past 7 days  Pain Frequency Intermittent  Aggravating Factors  from recent fall, movements  Pain Relieving Factors tylenol,       09/02/17 1337  Ambulation/Gait  Ambulation/Gait Yes  Ambulation/Gait Assistance 5: Supervision  Ambulation Distance (Feet)  (around gym and in/out of gym )  Assistive device None  Gait Pattern Step-through pattern;Decreased arm swing - left;Decreased step length - right;Decreased stance time - left;Decreased stride length;Decreased hip/knee flexion - left;Decreased dorsiflexion - left;Decreased weight shift to left;Left hip hike;Left foot flat;Left flexed knee in stance;Antalgic;Poor foot clearance - left  Ambulation Surface Level;Indoor  Stairs Yes  Stairs Assistance 5: Supervision  Stairs Assistance Details (indicate cue type and reason) carrying laundry basket up/down while bracing against "wall" for balance assistance.   Stair Management Technique No rails;Forwards;Step to pattern  Number of Stairs 4 (x 4 reps)  Self-Care  Self-Care Other Self-Care Comments  Other Self-Care Comments  fall prevention strategies with stairs- no socks, step to pattern when carrying items and to go slow. had pt practice with laundry basket on stairs in session today as well.   Neuro Re-ed   Neuro Re-ed Details  for balance while multi-tasking: gait around track while tossing hanker chief and naming foods,  then animals from A-Z. supervision to min guard assist with cues to keep on task and for name of item ~40%, pt able to keep up with letter.   Exercises  Exercises Other Exercises  Other Exercises  right sidelying: trialed foam roll to IT band due to reported tightness/pain after fall. too tender at this time to tolearate light pressure, will continue to monitor; then educated the pt on performance of clamshells  and standing hip abd alternating legs for gentle strengthening of hips. pt reported pain decreased to 5/10 after ex's.        PT Short Term Goals - 08/17/17 2315      PT SHORT TERM GOAL #1   Title  Patient demonstrates understanding of initial HEP. (All STGs Target Date 4th visit)    Baseline  MET 08/17/2017    Time  3    Period  Weeks    Status  Achieved      PT SHORT TERM GOAL #2   Title  Patient ambulates 500' outdoors including ramps & curbs without device with supervision.    Baseline  MET 08/17/2017    Time  3    Period  Weeks    Status  Achieved      PT SHORT TERM GOAL #3   Title  Patient is able to ambulate while performing simple cognitive task without balance loss.     Baseline  MET 08/17/2017    Time  3    Period  Weeks    Status  Achieved        PT Long Term Goals - 08/17/17 2316      PT LONG TERM GOAL #1   Title  Patient verbalizes & demonstrates ongoing HEP/ fitness plan. (All LTGs Target Date: 7th visit)    Baseline  08/17/2017 Patient is dependent in knowledge to advance exercises with medical issues including low BP.     Time  6    Period  Weeks    Status  On-going      PT LONG TERM GOAL #2   Title  Berg Balance </= 54/56    Baseline  MET 08/17/2017  Berg Balance 56/56    Time  6    Period  Weeks    Status  Achieved      PT LONG TERM GOAL #3   Title  Cognitive (simple naming task A-Z) Timed Up-Go <15.5 seconds     Baseline  08/17/2017 Cognitive TUG 16.92sec    Time  6    Period  Weeks    Status  On-going      PT LONG TERM GOAL #4   Title   Functional Gait Assessment >/= 19/30    Baseline  08/17/2017  Functional Gait Assessment 17/30    Time  6    Period  Weeks    Status  On-going      PT LONG TERM GOAL #5   Title  Patient ambulates 1000' outdoors including grass, ramps, curbs & stairs single rail without device modified independent.     Baseline  08/17/2017  Patient ambulates 600' on paved, firm surfaces, ramps & cubs with supervision. Patient has balance losses with divided attention.     Time  6    Period  Weeks    Status  On-going          09/02/17 1337  Plan  Clinical Impression Statement Today's skilled session addressed safe way to carry items on stairs due to pt's recent fall and then continued to address balance reactions when multitasking and/or on compliant surfaces. Pt is progressing toward goals and should benefit from continued PT to progress toward unmet goals.   Pt will benefit from skilled therapeutic intervention in order to improve on the following deficits Abnormal gait;Decreased activity tolerance;Decreased balance;Decreased coordination;Decreased endurance;Decreased mobility;Decreased strength  Rehab Potential Good  PT Frequency 1x / week  PT Duration 6 weeks  PT Treatment/Interventions ADLs/Self Care Home Management;Gait training;Stair training;Functional mobility training;Therapeutic activities;Therapeutic exercise;Balance training;Neuromuscular re-education;Patient/family education;Canalith Repostioning;Vestibular  PT Next Visit Plan gait on outdoor surfaces/compliant surfaces, dynamic gait activities, dual tasking with gait.   Consulted and Agree with Plan of Care Patient         Patient will benefit from skilled therapeutic intervention in order to improve the following deficits and impairments:  Abnormal gait, Decreased activity tolerance, Decreased balance, Decreased coordination, Decreased endurance, Decreased mobility, Decreased strength  Visit Diagnosis: Muscle weakness  (generalized)  Unsteadiness on feet  Hemiplegia and hemiparesis following cerebral infarction affecting left dominant side (HCC)  Paralytic gait     Problem List Patient Active Problem List   Diagnosis Date Noted  . Acute blood loss anemia   . Vascular headache   .  Hemiparesis affecting left side as late effect of stroke ([place])   . Tobacco use disorder 07/13/2017  . Noncompliance with medication regimen 07/13/2017  . Acute ischemic right MCA stroke ([place]) 07/13/2017  . Benign essential HTN   . Stroke (cerebrum) ([place]) 07/08/2017  . Middle cerebral artery embolism, right 07/08/2017  . Vitamin B12 deficiency 08/15/2016  . Acute cholecystitis 08/14/2016  . GERD (gastroesophageal reflux disease) 04/24/2015  . Hypertensive urgency 04/23/2015  . Chest pain, unspecified   . Right leg swelling 03/07/2015  . Preventative health care 02/21/2015  . Esophageal reflux   . NSTEMI (non-ST elevated myocardial infarction) ([place]) 01/28/2015  . Nausea without vomiting 12/22/2014  . Dysuria 12/21/2014  . Diarrhea 09/26/2014  . Difficulty swallowing pills 09/26/2014  . Hemorrhoids 06/16/2014  . Elevated AST (SGOT) 03/27/2014  . Lumbar herniated disc 03/27/2014  . TIA (transient ischemic attack) 02/19/2014  . History of ischemic left PCA stroke 02/19/2014  . Palpitations 01/27/2014  . Depression 08/15/2013  . Long term current use of anticoagulant therapy 08/10/2013  . Gastritis and duodenitis 08/03/2013  . Diverticulosis 08/03/2013  . Fatty liver 08/03/2013  . Esophagitis 08/03/2013  . Hiatal hernia 08/03/2013  . Apical mural thrombus 08/02/2013  . GI bleeding 06/04/2012  . Hypokalemia 06/04/2012  . Antithrombin III deficiency ([place]) 06/04/2012  . Paresthesias 06/29/2011  . Hyperlipemia 06/07/2009  . Essential hypertension 05/17/2009  . OBESITY-MORBID (>100') 03/13/2008  . CAD S/P  BMS to proximal LAD 2009-patent 01/29/15 06/28/2007    Willow Ora, PTA, [facility] [address] [place], [place] [address] [phone] 09/03/17, 11:41 PM   Name: Lindsey [name] MRN: [id number] Date of Birth: 01-21-1971

## 2017-09-06 NOTE — Progress Notes (Signed)
Cardiology Office Note   Date:  09/07/2017   ID:  Lindsey [name] 21-May-1970, MRN [id number]  PCP:  [facility]  Cardiologist: Dr. Jens Som   Chief Complaint  Patient presents with  . Follow-up  . Chest Pain   History of Present Illness: Lindsey [name] is a 47 y.o. female who presents for ongoing assessment and management of coronary artery disease, history of apical infarct in 2000, and STEMI in 2009 status post bare-metal stent to the proximal LAD) history of apical mural thrombus, TIA, hypertension, hyperlipidemia, and prior tobacco abuse with AT III deficiency.  He is currently on Lovenox therapy.  Was last seen during hospitalization on 08/15/2016 status post lap chole.  The patient has had a right MCA infarct on 07/08/2017.  On review of recent documentation by PCP, it was found that the patient had been missing doses of Xarelto and this was found the likely etiology of her CVA.  MRI of the head showed right frontal lobe and insular infarct.    The patient was also on Plavix but ran out earlier this month prior to seeing PCP.  Patient had continued complaints of dizziness and fell on 08/20/2017 while carrying a clothes basket down a flight of stairs.  She hit her head and had a friction burn on the left side of her face above her eye.  She has been referred back to cardiology for management of Xarelto and Plavix as she has now had falls and is an increased risk for brain hemorrhage if she were to fall again.  She was reinforced on the importance of compliance with Xarelto and Plavix.  She has complaints of constant chest pain unrelenting pressure. It is not exacerbated, or relieved by anything. She has also injured her left rotator cuff which has caused some persistent pain. She is continuing with physical rehab. She has not fallen and is much more steady on her feet. She is medically compliant with all medications.   Past Medical History:  Diagnosis Date  .  Anemia   . Antithrombin III deficiency (H[facility])    ?pt not sure if true diagnosis  . Anxiety   . Apical mural thrombus   . Blood transfusion   . Cholecystitis 07/2016  . Coronary artery disease    a. apical LAD infarction '00. b. NSTEMI s/p BMS to prox LAD '09. c. Cath 01/2015: stable LAD stent, otherwise minimal nonobstructive CAD.  Marland Kitchen GERD (gastroesophageal reflux disease)   . Hyperlipidemia   . Hypertension   . Myocardial infarction ([facility])   . Noncompliance with medication regimen    a. h/o noncompliance with med regimen (previous running out of Coumadin).  . Stroke ([facility])    assoc with short term memory loss and right peripheral vision loss; age 66   . TIA (transient ischemic attack) 2010  . Tobacco abuse     Past Surgical History:  Procedure Laterality Date  . CARDIAC CATHETERIZATION    . CARDIAC CATHETERIZATION N/A 01/29/2015   Procedure: Left Heart Cath and Coronary Angiography;  Surgeon: Peter M Swaziland, MD;  [facility] INVASIVE CV LAB;  Service: Cardiovascular;  Laterality: N/A;  . CHOLECYSTECTOMY N/A 08/14/2016   Procedure: LAPAROSCOPIC CHOLECYSTECTOMY;  Surgeon: Gaynelle Adu, MD;  Location: [facility] OR;  Service: General;  Laterality: N/A;  . COLONOSCOPY N/A 03/29/2014   Procedure: COLONOSCOPY;  Surgeon: Louis Meckel, MD;  Location: [facility] ENDOSCOPY;  Service: Endoscopy;  Laterality: N/A;  . CORONARY ANGIOPLASTY    .  ESOPHAGOGASTRODUODENOSCOPY N/A 06/09/2012   Procedure: ESOPHAGOGASTRODUODENOSCOPY (EGD);  Surgeon: Theda Belfast, MD;  Location: [facility] ENDOSCOPY;  Service: Endoscopy;  Laterality: N/A;  . ESOPHAGOGASTRODUODENOSCOPY N/A 08/02/2013   Procedure: ESOPHAGOGASTRODUODENOSCOPY (EGD);  Surgeon: Meryl Dare, MD;  Location: [facility] ENDOSCOPY;  Service: Endoscopy;  Laterality: N/A;  . IR ANGIO INTRA EXTRACRAN SEL COM CAROTID INNOMINATE UNI R MOD SED  07/08/2017  . LEFT HEART CATHETERIZATION WITH CORONARY ANGIOGRAM N/A 12/24/2011   Procedure: LEFT HEART CATHETERIZATION WITH CORONARY  ANGIOGRAM;  Surgeon: Kathleene Hazel, MD;  Location: [facility] CATH LAB;  Service: Cardiovascular;  Laterality: N/A;  . RADIOLOGY WITH ANESTHESIA N/A 07/08/2017   Procedure: IR WITH ANESTHESIA;  Surgeon: Radiologist, Medication, MD;  Location: [facility] OR;  Service: Radiology;  Laterality: N/A;  . TUBAL LIGATION       Current Outpatient Medications  Medication Sig Dispense Refill  . acetaminophen (TYLENOL) 325 MG tablet Take 1-2 tablets (325-650 mg total) by mouth every 4 (four) hours as needed for mild pain.    Marland Kitchen amLODipine (NORVASC) 10 MG tablet Take 10 mg by mouth daily.  3  . atorvastatin (LIPITOR) 40 MG tablet Take 1 tablet (40 mg total) by mouth daily. 30 tablet 0  . carvedilol (COREG) 25 MG tablet TAKE 1 TABLET BY MOUTH TWICE DAILY WITH MEALS 60 tablet 0  . clopidogrel (PLAVIX) 75 MG tablet Take 1 tablet (75 mg total) by mouth daily. 90 tablet 4  . hydrochlorothiazide (HYDRODIURIL) 25 MG tablet Take 25 mg by mouth daily.  3  . nitroGLYCERIN (NITROSTAT) 0.4 MG SL tablet Place 1 tablet (0.4 mg total) under the tongue every 5 (five) minutes as needed for chest pain (up to 3 doses). 15 tablet 0  . pantoprazole (PROTONIX) 20 MG tablet Take 20 mg by mouth daily.    . vitamin B-12 (CYANOCOBALAMIN) 1000 MCG tablet Take 1 tablet (1,000 mcg total) by mouth daily. 30 tablet 0  . XARELTO 20 MG TABS tablet TAKE 1 TABLET BY MOUTH ONCE DAILY WITH  SUPPER 30 tablet 0   No current facility-administered medications for this visit.     Allergies:   Patient has no known allergies.    Social History:  The patient  reports that she has quit smoking. Her smoking use included cigarettes. She has a 0.20 pack-year smoking history. She has never used smokeless tobacco. She reports that she drinks alcohol. She reports that she has current or past drug history. Drug: Cocaine.   Family History:  The patient's family history includes Breast cancer in her maternal aunt; Heart disease in her brother; Stroke in her  father.    ROS: All other systems are reviewed and negative. Unless otherwise mentioned in H&P    PHYSICAL EXAM: VS:  BP 114/72   Pulse 76   Ht 5\' 6"  (1.676 m)   Wt 171 lb (77.6 kg)   BMI 27.60 kg/m  , BMI Body mass index is 27.6 kg/m. GEN: Well nourished, well developed, in no acute distress  HEENT: normal  Neck: no JVD, carotid bruits, or masses Cardiac: RRR; no murmurs, rubs, or gallops,no edema  Respiratory:  clear to auscultation bilaterally, normal work of breathing GI: soft, nontender, nondistended, + BS MS: no deformity or atrophy  Skin: warm and dry, no rash Neuro:  Strength and sensation are intact Psych: euthymic mood, full affect   EKG:  SR wit occasional PVC's. Septal Q-waves are noted. Rate 84 bpm.   Recent Labs: 07/14/2017: ALT 12; BUN 7; Creatinine, Ser  0.75; Potassium 3.8; Sodium 140 07/17/2017: Hemoglobin 10.6; Platelets 247    Lipid Panel    Component Value Date/Time   CHOL 147 07/09/2017 0527   TRIG 125 07/09/2017 0527   HDL 37 (L) 07/09/2017 0527   CHOLHDL 4.0 07/09/2017 0527   VLDL 25 07/09/2017 0527   LDLCALC 85 07/09/2017 0527      Wt Readings from Last 3 Encounters:  09/07/17 171 lb (77.6 kg)  08/31/17 168 lb 3.2 oz (76.3 kg)  08/24/17 168 lb (76.2 kg)      Other studies Reviewed:  Cardiac Cath 01/29/2015 Conclusion    Prox RCA lesion, 20% stenosed.  Mid LAD to Dist LAD lesion, 20% stenosed.  There is mild left ventricular systolic dysfunction.   1. Minimal nonobstructive CAD. Patent stent in the LAD 2. Mild LV dysfunction with chronic akinesis of the apex. 3. Complex filling defect in the LV apex consistent with thrombus.  Plan: resume full anticoagulation. Will assess with Echo.    Echocardiogram 01/28/2018 Left ventricle: The cavity size was normal. Wall thickness was   increased in a pattern of mild LVH. Systolic function was normal.   The estimated ejection fraction was in the range of 55% to 60%.   Akinesis of the  apical myocardium. Doppler parameters are   consistent with abnormal left ventricular relaxation (grade 1   diastolic dysfunction). There was a medium-sized, fixed,   apicalthrombus. - Left atrium: The atrium was mildly dilated.   ASSESSMENT AND PLAN:  1. CAD: Having recurrent chest pain.  She has a history of bare-metal stent to the LAD in 2009 over is playing and I realize, the pain is atypical, she describes it is constant pressure.  However with her history I would like to be more thorough and evaluate with Lexiscan Myoview.  This is been explained to her.  She is willing to proceed.  The patient will continue on Plavix, and Xarelto for now as she has not had any further issues with falling.  The patient is compliant now with anticoagulation and antiplatelet therapy.  2.  History of MCA infarct: Secondary to medical noncompliance with Plavix and Xarelto.  Patient is now compliant.  She is undergoing physical rehab for stability, and also speech therapy.  She states she walks without assistance and remains balanced.   3.  Hypertension: Currently well controlled.  No changes in her medication regimen.  4. Hx of Apical Mural Thrombus: Continue on Xarelto.   Current medicines are reviewed at length with the patient today.    Labs/ tests ordered today include Lexiscan Myoview. Lipids and LFT's, BMET and CBC.   Bettey Mare. Liborio Nixon, ANP, AACC   09/07/2017 9:56 AM    [facility] [address], [place], [place] [address] Phone: [phone]; Fax: [phone]

## 2017-09-07 ENCOUNTER — Encounter: Payer: Medicaid Other | Admitting: Occupational Therapy

## 2017-09-07 ENCOUNTER — Ambulatory Visit: Payer: Medicaid Other | Admitting: Adult Health

## 2017-09-07 ENCOUNTER — Encounter: Payer: Self-pay | Admitting: Adult Health

## 2017-09-07 VITALS — BP 114/72 | HR 76 | Ht 66.0 in | Wt 171.0 lb

## 2017-09-07 DIAGNOSIS — I2510 Atherosclerotic heart disease of native coronary artery without angina pectoris: Secondary | ICD-10-CM | POA: Diagnosis not present

## 2017-09-07 DIAGNOSIS — R079 Chest pain, unspecified: Secondary | ICD-10-CM

## 2017-09-07 DIAGNOSIS — Z79899 Other long term (current) drug therapy: Secondary | ICD-10-CM | POA: Diagnosis not present

## 2017-09-07 DIAGNOSIS — I6340 Cerebral infarction due to embolism of unspecified cerebral artery: Secondary | ICD-10-CM | POA: Diagnosis not present

## 2017-09-07 DIAGNOSIS — R0789 Other chest pain: Secondary | ICD-10-CM

## 2017-09-07 DIAGNOSIS — I10 Essential (primary) hypertension: Secondary | ICD-10-CM

## 2017-09-07 NOTE — Patient Instructions (Signed)
Medication Instructions:  NO CHANGES- Your physician recommends that you continue on your current medications as directed. Please refer to the Current Medication list given to you today.  If you need a refill on your cardiac medications before your next appointment, please call your pharmacy.  Labwork: CBC,BMET,LFT,FASTING LIPIDS THE DAY OF LEXISCAN HERE IN OUR OFFICE AT [facility]  Take the provided lab slips with you to the lab for your blood draw.   You will need to fast. DO NOT EAT OR DRINK PAST MIDNIGHT.   Testing/Procedures: Your physician has requested that you have a lexiscan myoview. A cardiac stress test is a cardiological test that measures the heart's ability to respond to external stress in a controlled clinical environment. The stress response is induced byintravenous pharmacological stimulation. For further information please visit [email or web address] Please follow instructions below. How to prepare for your Myocardial Perfusion Test:   Do not eat or drink 3 hours prior to your test, except you may have water.  Do not consume products containing caffeine (regular or decaffeinated) 12 hours prior to your test. (ex: coffee, chocolate, sodas, tea).  Do wear comfortable clothes (no dresses or overalls) and walking shoes, tennis shoes preferred (No heels or open toe shoes are allowed).  Do NOT wear cologne, perfume, aftershave, or lotions (deodorant is allowed).  If these instructions are not followed, your test will have to be rescheduled.  If you have questions or concerns about your appointment, you can call the Nuclear Lab at [phone].  Follow-Up: Your physician wants you to follow-up in: AFTER TESTING WITH DR CRENSHAW -OR- KATHRYN LAWRENCE (NURSE PRACTIONIER), DNP,AACC IF PRIMARY CARDIOLOGIST IS UNAVAILABLE.    Thank you for choosing [facility] at [facility]!!

## 2017-09-09 ENCOUNTER — Ambulatory Visit: Payer: Medicaid Other | Admitting: Physical Therapy

## 2017-09-10 ENCOUNTER — Encounter: Payer: Self-pay | Admitting: Physical Therapy

## 2017-09-10 ENCOUNTER — Ambulatory Visit: Payer: Medicare Other | Admitting: Speech Pathology

## 2017-09-10 ENCOUNTER — Telehealth (HOSPITAL_COMMUNITY): Payer: Self-pay

## 2017-09-10 ENCOUNTER — Encounter: Payer: Self-pay | Admitting: Speech Pathology

## 2017-09-10 ENCOUNTER — Ambulatory Visit: Payer: Medicare Other | Admitting: Physical Therapy

## 2017-09-10 ENCOUNTER — Encounter: Payer: Medicaid Other | Admitting: Occupational Therapy

## 2017-09-10 DIAGNOSIS — R2681 Unsteadiness on feet: Secondary | ICD-10-CM

## 2017-09-10 DIAGNOSIS — R41841 Cognitive communication deficit: Secondary | ICD-10-CM | POA: Diagnosis not present

## 2017-09-10 DIAGNOSIS — R471 Dysarthria and anarthria: Secondary | ICD-10-CM | POA: Diagnosis not present

## 2017-09-10 DIAGNOSIS — I69352 Hemiplegia and hemiparesis following cerebral infarction affecting left dominant side: Secondary | ICD-10-CM

## 2017-09-10 DIAGNOSIS — M6281 Muscle weakness (generalized): Secondary | ICD-10-CM

## 2017-09-10 DIAGNOSIS — R261 Paralytic gait: Secondary | ICD-10-CM | POA: Diagnosis not present

## 2017-09-10 NOTE — Telephone Encounter (Signed)
Encounter complete. 

## 2017-09-10 NOTE — Therapy (Signed)
[facility] [address] [place], [place], [address] Phone: [phone]   Fax:  [phone]  Speech Language Pathology Treatment  Patient Details  Name: Lindsey [name] MRN: [id number] Date of Birth: 1970/08/02 Referring Provider: Dr. Alysia Penna   Encounter Date: 09/10/2017  End of Session - 09/10/17 1155    Visit Number  9    Number of Visits  9    Date for SLP Re-Evaluation  10/20/17    Authorization Type  - 8 visits through 09/14/17    Authorization - Visit Number  8    Authorization - Number of Visits  8    SLP Start Time  1104    SLP Stop Time   1141    SLP Time Calculation (min)  37 min    Activity Tolerance  Patient tolerated treatment well       Past Medical History:  Diagnosis Date  . Anemia   . Antithrombin III deficiency ([place])    ?pt not sure if true diagnosis  . Anxiety   . Apical mural thrombus   . Blood transfusion   . Cholecystitis 07/2016  . Coronary artery disease    a. apical LAD infarction '00. b. NSTEMI s/p BMS to prox LAD '09. c. Cath 01/2015: stable LAD stent, otherwise minimal nonobstructive CAD.  Marland Kitchen GERD (gastroesophageal reflux disease)   . Hyperlipidemia   . Hypertension   . Myocardial infarction ([place])   . Noncompliance with medication regimen    a. h/o noncompliance with med regimen (previous running out of Coumadin).  . Stroke ([place])    assoc with short term memory loss and right peripheral vision loss; age 26   . TIA (transient ischemic attack) 2010  . Tobacco abuse     Past Surgical History:  Procedure Laterality Date  . CARDIAC CATHETERIZATION    . CARDIAC CATHETERIZATION N/A 01/29/2015   Procedure: Left Heart Cath and Coronary Angiography;  Surgeon: Peter M Martinique, MD;  Location: [facility];  Service: Cardiovascular;  Laterality: N/A;  . CHOLECYSTECTOMY N/A 08/14/2016   Procedure: LAPAROSCOPIC CHOLECYSTECTOMY;  Surgeon: Greer Pickerel, MD;  Location: [facility];   Service: General;  Laterality: N/A;  . COLONOSCOPY N/A 03/29/2014   Procedure: COLONOSCOPY;  Surgeon: Inda Castle, MD;  Location: [facility];  Service: Endoscopy;  Laterality: N/A;  . CORONARY ANGIOPLASTY    . ESOPHAGOGASTRODUODENOSCOPY N/A 06/09/2012   Procedure: ESOPHAGOGASTRODUODENOSCOPY (EGD);  Surgeon: Beryle Beams, MD;  Location: [facility];  Service: Endoscopy;  Laterality: N/A;  . ESOPHAGOGASTRODUODENOSCOPY N/A 08/02/2013   Procedure: ESOPHAGOGASTRODUODENOSCOPY (EGD);  Surgeon: Ladene Artist, MD;  Location: [facility] ENDOSCOPY;  Service: Endoscopy;  Laterality: N/A;  . IR ANGIO INTRA EXTRACRAN SEL COM CAROTID INNOMINATE UNI R MOD SED  07/08/2017  . LEFT HEART CATHETERIZATION WITH CORONARY ANGIOGRAM N/A 12/24/2011   Procedure: LEFT HEART CATHETERIZATION WITH CORONARY ANGIOGRAM;  Surgeon: Burnell Blanks, MD;  Location: [facility] CATH LAB;  Service: Cardiovascular;  Laterality: N/A;  . RADIOLOGY WITH ANESTHESIA N/A 07/08/2017   Procedure: IR WITH ANESTHESIA;  Surgeon: Radiologist, Medication, MD;  Location: [facility];  Service: Radiology;  Laterality: N/A;  . TUBAL LIGATION      There were no vitals filed for this visit.  Subjective Assessment - 09/10/17 1111    Subjective  "I was at [place] at mid night but I went right back to sleep"    Currently in Pain?  Yes    Pain Score  3  Pain Location  Shoulder    Pain Orientation  Left    Pain Descriptors / Indicators  Sharp    Pain Type  Acute pain    Pain Onset  In the past 7 days    Pain Frequency  Constant    Aggravating Factors   moving it    Pain Relieving Factors  resting, tylenol    Multiple Pain Sites  No            ADULT SLP TREATMENT - 09/10/17 1113      General Information   Behavior/Cognition  Alert;Cooperative;Pleasant mood      Treatment Provided   Treatment provided  Cognitive-Linquistic      Cognitive-Linquistic Treatment   Treatment focused on  Cognition    Skilled Treatment  Pt reports carryover of  compensations for bill paying, circling the amount. She reports improved accuracy with bills and finances. Alternating attention with  simple conversation and moderately compex math reasoning with rare min A 85% on math, 100% conversation. Check balancing 100% correct, however when conversation was interjected as a distractor, pt lost her place and required extended time find what she was to do next and caught a mistake in entering amoutn in calculator. Pt verbalizes that she can not have distractions while she is working on her bills.      Assessment / Recommendations / Plan   Plan  Discharge SLP treatment due to (comment)      Progression Toward Goals   Progression toward goals  Goals met, education completed, patient discharged from [facility]  Visits from Start of Care: 9  Current functional level related to goals / functional outcomes: See goals below   Remaining deficits: High level cognition, left labial weakness   Education / Equipment: Compensations for attention and memory, and financial mangagement Plan: Patient agrees to discharge.  Patient goals were met. Patient is being discharged due to meeting the stated rehab goals.  ?????          SLP Short Term Goals - 09/10/17 1154      SLP SHORT TERM GOAL #1   Title  Pt will complete HEP for dysarthria with rare min A.    Baseline  Pt is not completing exercises for dysarthria    Status  Achieved      SLP SHORT TERM GOAL #2   Title  Pt will solve functional money problems with rare min A.,    Baseline  Pt could not solve simple money problems on eval     Time  1    Period  Weeks    Status  Achieved      SLP SHORT TERM GOAL #3   Title  Pt will alternate attention between 2 simple cognitive lingusitic tasks with 85% on each and rare min A    Baseline  Pt attention level is selective at eval    Time  1    Period  Weeks    Status  Achieved      SLP SHORT TERM GOAL #4   Title   Pt will use external aids for daily recall and schedule management    Baseline  Pt is not uisng external aids to help her remember or attend    Time  1    Period  Weeks    Status  Achieved       SLP Long Term Goals - 09/10/17 1154  SLP LONG TERM GOAL #1   Title  Pt will balance mildly complex money account with rare min A    Baseline  Pt unable to do simple check balancing at eval    Time  4    Period  Weeks    Status  Achieved      SLP LONG TERM GOAL #2   Title  Pt will use external aids/system to manage her home financial management over 2 sessions     Baseline  Her mother is currently managing her finances. 09/01/17; 09/09/17    Time  4    Period  Weeks    Status  Achieved      SLP LONG TERM GOAL #3   Title  Pt will alternate attention between 2 mildly complex cognitive linguistic tasks with 85% on each and occasional min A    Baseline  Attention level is selective at eval    Time  4    Period  Weeks    Status  Achieved       Plan - 09/10/17 1151    Clinical Impression Statement  Pt has demonstrated excellent carryover of memory aids and external aids for attention and financial mangagement. She is alternating attention on simple tasks. Pt continues to be distractable, but is using compensations for this and has shown improved anticiaptory awareness. All goals met, d/c ST. Pt in agreement.    Speech Therapy Frequency  2x / week    Treatment/Interventions  SLP instruction and feedback;Cueing hierarchy;Oral motor exercises;Cognitive reorganization;Functional tasks;Compensatory strategies;Patient/family education;Internal/external aids;Environmental controls    Potential to Achieve Goals  Good       Patient will benefit from skilled therapeutic intervention in order to improve the following deficits and impairments:   Cognitive communication deficit    Problem List Patient Active Problem List   Diagnosis Date Noted  . Acute blood loss anemia   . Vascular headache    . Hemiparesis affecting left side as late effect of stroke ([place])   . Tobacco use disorder 07/13/2017  . Noncompliance with medication regimen 07/13/2017  . Acute ischemic right MCA stroke ([place]) 07/13/2017  . Benign essential HTN   . Stroke (cerebrum) ([place]) 07/08/2017  . Middle cerebral artery embolism, right 07/08/2017  . Vitamin B12 deficiency 08/15/2016  . Acute cholecystitis 08/14/2016  . GERD (gastroesophageal reflux disease) 04/24/2015  . Hypertensive urgency 04/23/2015  . Chest pain, unspecified   . Right leg swelling 03/07/2015  . Preventative health care 02/21/2015  . Esophageal reflux   . NSTEMI (non-ST elevated myocardial infarction) ([place]) 01/28/2015  . Nausea without vomiting 12/22/2014  . Dysuria 12/21/2014  . Diarrhea 09/26/2014  . Difficulty swallowing pills 09/26/2014  . Hemorrhoids 06/16/2014  . Elevated AST (SGOT) 03/27/2014  . Lumbar herniated disc 03/27/2014  . TIA (transient ischemic attack) 02/19/2014  . History of ischemic left PCA stroke 02/19/2014  . Palpitations 01/27/2014  . Depression 08/15/2013  . Long term current use of anticoagulant therapy 08/10/2013  . Gastritis and duodenitis 08/03/2013  . Diverticulosis 08/03/2013  . Fatty liver 08/03/2013  . Esophagitis 08/03/2013  . Hiatal hernia 08/03/2013  . Apical mural thrombus 08/02/2013  . GI bleeding 06/04/2012  . Hypokalemia 06/04/2012  . Antithrombin III deficiency ([place]) 06/04/2012  . Paresthesias 06/29/2011  . Hyperlipemia 06/07/2009  . Essential hypertension 05/17/2009  . OBESITY-MORBID (>100') 03/13/2008  . CAD S/P  BMS to proximal LAD 2009-patent 01/29/15 06/28/2007    Lovvorn, Annye Rusk Ms, [place] 09/10/2017, 11:56 AM  [facility] [address] [place], [place], [address] Phone: [phone]   Fax:  [phone]   Name: Lindsey [name] MRN: [id number] Date of Birth: 1970/10/14

## 2017-09-10 NOTE — Patient Instructions (Signed)
"  Because I've had a stroke and my attention, focus, and processing speed are affected"  I look the same on the outside, but my brain has changed on the inside, you just can't see it.  I need more time to think and no distractions to focus because I had a stroke

## 2017-09-13 NOTE — Therapy (Signed)
[facility] [address] [place], [place], [address] Phone: [phone]   Fax:  [phone]  Physical Therapy Treatment  Patient Details  Name: Lindsey [name] MRN: [id number] Date of Birth: [birth year] Referring Provider: Alysia Penna, MD   Encounter Date: 09/10/2017   09/10/17 1453  PT Visits / Re-Eval  Visit Number 6  Number of Visits 7  Authorization  Authorization Type Medicaid  Authorization Time Period 5/20 - 6/9= 3 visits during this time frame; 6/10-6/30= 3 more additional visists authorized  Authorization - Visit Number 5  Authorization - Number of Visits 6  PT Time Calculation  PT Start Time 9449  PT Stop Time 1528  PT Time Calculation (min) 39 min  PT - End of Session  Equipment Utilized During Treatment Gait belt  Activity Tolerance Patient tolerated treatment well  Behavior During Therapy [facility] for tasks assessed/performed     Past Medical History:  Diagnosis Date  . Anemia   . Antithrombin III deficiency ([place])    ?pt not sure if true diagnosis  . Anxiety   . Apical mural thrombus   . Blood transfusion   . Cholecystitis 07/2016  . Coronary artery disease    a. apical LAD infarction '00. b. NSTEMI s/p BMS to prox LAD '09. c. Cath 01/2015: stable LAD stent, otherwise minimal nonobstructive CAD.  Marland Kitchen GERD (gastroesophageal reflux disease)   . Hyperlipidemia   . Hypertension   . Myocardial infarction ([place])   . Noncompliance with medication regimen    a. h/o noncompliance with med regimen (previous running out of Coumadin).  . Stroke ([place])    assoc with short term memory loss and right peripheral vision loss; age 70   . TIA (transient ischemic attack) 2010  . Tobacco abuse     Past Surgical History:  Procedure Laterality Date  . CARDIAC CATHETERIZATION    . CARDIAC CATHETERIZATION N/A 01/29/2015   Procedure: Left Heart Cath and Coronary Angiography;  Surgeon: Peter M Martinique, MD;  Location:  [facility];  Service: Cardiovascular;  Laterality: N/A;  . CHOLECYSTECTOMY N/A 08/14/2016   Procedure: LAPAROSCOPIC CHOLECYSTECTOMY;  Surgeon: Greer Pickerel, MD;  Location: [facility];  Service: General;  Laterality: N/A;  . COLONOSCOPY N/A 03/29/2014   Procedure: COLONOSCOPY;  Surgeon: Inda Castle, MD;  Location: [facility];  Service: Endoscopy;  Laterality: N/A;  . CORONARY ANGIOPLASTY    . ESOPHAGOGASTRODUODENOSCOPY N/A 06/09/2012   Procedure: ESOPHAGOGASTRODUODENOSCOPY (EGD);  Surgeon: Beryle Beams, MD;  Location: [facility];  Service: Endoscopy;  Laterality: N/A;  . ESOPHAGOGASTRODUODENOSCOPY N/A 08/02/2013   Procedure: ESOPHAGOGASTRODUODENOSCOPY (EGD);  Surgeon: Ladene Artist, MD;  Location: [facility] ENDOSCOPY;  Service: Endoscopy;  Laterality: N/A;  . IR ANGIO INTRA EXTRACRAN SEL COM CAROTID INNOMINATE UNI R MOD SED  07/08/2017  . LEFT HEART CATHETERIZATION WITH CORONARY ANGIOGRAM N/A 12/24/2011   Procedure: LEFT HEART CATHETERIZATION WITH CORONARY ANGIOGRAM;  Surgeon: Burnell Blanks, MD;  Location: [facility] CATH LAB;  Service: Cardiovascular;  Laterality: N/A;  . RADIOLOGY WITH ANESTHESIA N/A 07/08/2017   Procedure: IR WITH ANESTHESIA;  Surgeon: Radiologist, Medication, MD;  Location: [facility];  Service: Radiology;  Laterality: N/A;  . TUBAL LIGATION      There were no vitals filed for this visit.     09/10/17 1451  Symptoms/Limitations  Subjective No more falls. Still with hip pain on the left leg. Pt encouraged to follow up with her primary MD if pain does not improve.  Patient is  accompained by: Family member  Pertinent History R CVA 07/08/17 & hx of ischemic left PCA stroke, HTN, NSTEMI, CAD, MI, TIA,  Limitations Lifting;Walking;House hold activities;Standing  Patient Stated Goals walking, stairs, getting in/out shower, no falls,   Pain Assessment  Currently in Pain? Yes  Pain Score 2  Pain Location Shoulder  Pain Orientation Left  Pain Descriptors / Indicators Dull   Pain Type Acute pain  Pain Onset 1 to 4 weeks ago  Pain Frequency Constant  Aggravating Factors  moving it  Pain Relieving Factors resting, tylenol  2nd Pain Site  Pain Score 3  Pain Location Leg  Pain Orientation Left  Pain Descriptors / Indicators Sharp  Pain Frequency Intermittent  Pain Onset More than a month ago  Aggravating Factors  from recent fall, walking  Pain Relieving Factors tylenol, resting  Pain Type Acute pain      09/10/17 1501  Ambulation/Gait  Ambulation/Gait Yes  Ambulation/Gait Assistance 6: Modified independent (Device/Increase time)  Ambulation/Gait Assistance Details pt able to engage in conversation/cognitive task throughout with no balance issues  Ambulation Distance (Feet) 1000 Feet  Assistive device None  Gait Pattern WFL  Ambulation Surface Level;Unlevel;Indoor;Outdoor;Paved;Grass  Stairs Yes  Stairs Assistance 6: Modified independent (Device/Increase time)  Stairs Assistance Details (indicate cue type and reason) no issues.   Stair Management Technique No rails;Alternating pattern;Forwards  Number of Stairs 4  Ramp 6: Modified independent (Device)  Curb 6: Modified independent (Device/increase time)  Timed Up and Go Test  Normal TUG (seconds) 8.31  Cognitive TUG (seconds) 8.31 (naming foods a-z)  TUG Normal TUG;Cognitive TUG  Functional Gait  Assessment  Gait assessed  Yes  Gait Level Surface 3 (5.1 sec's)  Change in Gait Speed 3  Gait with Horizontal Head Turns 2  Gait with Vertical Head Turns 3  Gait and Pivot Turn 3  Step Over Obstacle 3  Gait with Narrow Base of Support 3  Gait with Eyes Closed 2  Ambulating Backwards 3  Steps 3  Total Score 28       09/10/17 1501  Self-Care  Self-Care Other Self-Care Comments  Other Self-Care Comments  discussed continuing with current HEP as it is still challenging. Also discussed community fitness options. Pt plans to look into gyms near her for continued work on endurance, strengthening  and balance.                                  PT Short Term Goals - 08/17/17 2315      PT SHORT TERM GOAL #1   Title  Patient demonstrates understanding of initial HEP. (All STGs Target Date 4th visit)    Baseline  MET 08/17/2017    Time  3    Period  Weeks    Status  Achieved      PT SHORT TERM GOAL #2   Title  Patient ambulates 500' outdoors including ramps & curbs without device with supervision.    Baseline  MET 08/17/2017    Time  3    Period  Weeks    Status  Achieved      PT SHORT TERM GOAL #3   Title  Patient is able to ambulate while performing simple cognitive task without balance loss.     Baseline  MET 08/17/2017    Time  3    Period  Weeks    Status  Achieved  PT Long Term Goals - 09/10/17 1454      PT LONG TERM GOAL #1   Title  Patient verbalizes & demonstrates ongoing HEP/ fitness plan. (All LTGs Target Date: 7th visit)    Baseline  08/17/2017 Patient is dependent in knowledge to advance exercises with medical issues including low BP.     Time  6    Period  Weeks    Status  On-going      PT LONG TERM GOAL #2   Title  Berg Balance </= 54/56    Baseline  MET 08/17/2017  Berg Balance 56/56    Status  Achieved      PT LONG TERM GOAL #3   Title  Cognitive (simple naming task A-Z) Timed Up-Go <15.5 seconds     Baseline  08/17/2017 Cognitive TUG 16.92sec    Time  6    Period  Weeks    Status  On-going      PT LONG TERM GOAL #4   Title  Functional Gait Assessment >/= 19/30    Baseline  08/17/2017  Functional Gait Assessment 17/30    Time  6    Period  Weeks    Status  On-going      PT LONG TERM GOAL #5   Title  Patient ambulates 1000' outdoors including grass, ramps, curbs & stairs single rail without device modified independent.     Baseline  08/17/2017  Patient ambulates 600' on paved, firm surfaces, ramps & cubs with supervision. Patient has balance losses with divided attention.     Status  Achieved          09/10/17 1454  Plan  Clinical  Impression Statement Today's skilled session focused on progress toward LTGs with all goals met. Pt agreeable to discharge. Encouraged pt to look into local fitness centers near her for continued fitness post therapy. Pt agreed with this.   Pt will benefit from skilled therapeutic intervention in order to improve on the following deficits Abnormal gait;Decreased activity tolerance;Decreased balance;Decreased coordination;Decreased endurance;Decreased mobility;Decreased strength  Rehab Potential Good  PT Frequency 1x / week  PT Duration 6 weeks  PT Treatment/Interventions ADLs/Self Care Home Management;Gait training;Stair training;Functional mobility training;Therapeutic activities;Therapeutic exercise;Balance training;Neuromuscular re-education;Patient/family education;Canalith Repostioning;Vestibular  PT Next Visit Plan discharge today with all goals met.  Consulted and Agree with Plan of Care Patient         Patient will benefit from skilled therapeutic intervention in order to improve the following deficits and impairments:  Abnormal gait, Decreased activity tolerance, Decreased balance, Decreased coordination, Decreased endurance, Decreased mobility, Decreased strength  Visit Diagnosis: Muscle weakness (generalized)  Unsteadiness on feet  Hemiplegia and hemiparesis following cerebral infarction affecting left dominant side [facility])     Problem List Patient Active Problem List   Diagnosis Date Noted  . Acute blood loss anemia   . Vascular headache   . Hemiparesis affecting left side as late effect of stroke ([place])   . Tobacco use disorder 07/13/2017  . Noncompliance with medication regimen 07/13/2017  . Acute ischemic right MCA stroke ([place]) 07/13/2017  . Benign essential HTN   . Stroke (cerebrum) ([place]) 07/08/2017  . Middle cerebral artery embolism, right 07/08/2017  . Vitamin B12 deficiency 08/15/2016  . Acute cholecystitis 08/14/2016  . GERD (gastroesophageal reflux disease)  04/24/2015  . Hypertensive urgency 04/23/2015  . Chest pain, unspecified   . Right leg swelling 03/07/2015  . Preventative health care 02/21/2015  . Esophageal reflux   . NSTEMI (non-ST elevated myocardial  infarction) ([place]) 01/28/2015  . Nausea without vomiting 12/22/2014  . Dysuria 12/21/2014  . Diarrhea 09/26/2014  . Difficulty swallowing pills 09/26/2014  . Hemorrhoids 06/16/2014  . Elevated AST (SGOT) 03/27/2014  . Lumbar herniated disc 03/27/2014  . TIA (transient ischemic attack) 02/19/2014  . History of ischemic left PCA stroke 02/19/2014  . Palpitations 01/27/2014  . Depression 08/15/2013  . Long term current use of anticoagulant therapy 08/10/2013  . Gastritis and duodenitis 08/03/2013  . Diverticulosis 08/03/2013  . Fatty liver 08/03/2013  . Esophagitis 08/03/2013  . Hiatal hernia 08/03/2013  . Apical mural thrombus 08/02/2013  . GI bleeding 06/04/2012  . Hypokalemia 06/04/2012  . Antithrombin III deficiency ([place]) 06/04/2012  . Paresthesias 06/29/2011  . Hyperlipemia 06/07/2009  . Essential hypertension 05/17/2009  . OBESITY-MORBID (>100') 03/13/2008  . CAD S/P  BMS to proximal LAD 2009-patent 01/29/15 06/28/2007    Willow Ora, PTA, [facility] [address] [place], [place] [address] [phone] 09/13/17, 1:10 PM   Name: Lindsey [name] MRN: [id number] Date of Birth: 10/06/1970

## 2017-09-15 ENCOUNTER — Ambulatory Visit (HOSPITAL_COMMUNITY)
Admission: RE | Admit: 2017-09-15 | Discharge: 2017-09-15 | Disposition: A | Discharge status: Home / Self Care | Payer: Medicare Other | Source: Ambulatory Visit | Attending: Cardiovascular Disease | Admitting: Cardiovascular Disease

## 2017-09-15 DIAGNOSIS — I10 Essential (primary) hypertension: Secondary | ICD-10-CM | POA: Insufficient documentation

## 2017-09-15 DIAGNOSIS — Z8249 Family history of ischemic heart disease and other diseases of the circulatory system: Secondary | ICD-10-CM | POA: Diagnosis not present

## 2017-09-15 DIAGNOSIS — I2510 Atherosclerotic heart disease of native coronary artery without angina pectoris: Secondary | ICD-10-CM | POA: Diagnosis not present

## 2017-09-15 DIAGNOSIS — Z8673 Personal history of transient ischemic attack (TIA), and cerebral infarction without residual deficits: Secondary | ICD-10-CM | POA: Diagnosis not present

## 2017-09-15 DIAGNOSIS — Z87891 Personal history of nicotine dependence: Secondary | ICD-10-CM | POA: Insufficient documentation

## 2017-09-15 DIAGNOSIS — I252 Old myocardial infarction: Secondary | ICD-10-CM | POA: Diagnosis not present

## 2017-09-15 DIAGNOSIS — R9439 Abnormal result of other cardiovascular function study: Secondary | ICD-10-CM | POA: Insufficient documentation

## 2017-09-15 DIAGNOSIS — R079 Chest pain, unspecified: Secondary | ICD-10-CM | POA: Diagnosis not present

## 2017-09-15 DIAGNOSIS — Z9861 Coronary angioplasty status: Secondary | ICD-10-CM | POA: Diagnosis not present

## 2017-09-15 DIAGNOSIS — K219 Gastro-esophageal reflux disease without esophagitis: Secondary | ICD-10-CM | POA: Insufficient documentation

## 2017-09-15 LAB — MYOCARDIAL PERFUSION IMAGING
CHL CUP NUCLEAR SDS: 2
CHL CUP RESTING HR STRESS: 60 {beats}/min
LV dias vol: 93 mL (ref 46–106)
LVSYSVOL: 43 mL
NUC STRESS TID: 1.09
Peak HR: 100 {beats}/min
SRS: 8
SSS: 10

## 2017-09-15 MED ORDER — REGADENOSON 0.4 MG/5ML IV SOLN
0.4000 mg | Freq: Once | INTRAVENOUS | Status: AC
  Administered 2017-09-15: 0.4 mg via INTRAVENOUS

## 2017-09-15 MED ORDER — TECHNETIUM TC 99M TETROFOSMIN IV KIT
8.4000 | PACK | Freq: Once | INTRAVENOUS | Status: AC | PRN
  Administered 2017-09-15: 8.4 via INTRAVENOUS
  Filled 2017-09-15: qty 9

## 2017-09-15 MED ORDER — TECHNETIUM TC 99M TETROFOSMIN IV KIT
25.4000 | PACK | Freq: Once | INTRAVENOUS | Status: AC | PRN
  Administered 2017-09-15: 25.4 via INTRAVENOUS
  Filled 2017-09-15: qty 26

## 2017-09-23 ENCOUNTER — Encounter: Payer: Self-pay | Admitting: Obstetrics & Gynecology

## 2017-09-23 ENCOUNTER — Ambulatory Visit: Payer: Medicaid Other | Admitting: Obstetrics & Gynecology

## 2017-09-23 ENCOUNTER — Other Ambulatory Visit (HOSPITAL_COMMUNITY)
Admission: RE | Admit: 2017-09-23 | Discharge: 2017-09-23 | Disposition: A | Discharge status: Home / Self Care | Payer: Medicare Other | Source: Ambulatory Visit | Attending: Obstetrics & Gynecology | Admitting: Obstetrics & Gynecology

## 2017-09-23 VITALS — BP 90/41 | HR 65 | Ht 66.0 in | Wt 170.9 lb

## 2017-09-23 DIAGNOSIS — N950 Postmenopausal bleeding: Secondary | ICD-10-CM | POA: Insufficient documentation

## 2017-09-23 DIAGNOSIS — Z1231 Encounter for screening mammogram for malignant neoplasm of breast: Secondary | ICD-10-CM

## 2017-09-23 LAB — POCT PREGNANCY, URINE: PREG TEST UR: NEGATIVE

## 2017-09-23 NOTE — Progress Notes (Signed)
GYNECOLOGY OFFICE VISIT NOTE  History:  47 y.o. G2P2000 here today for evaluation of postmenopausal bleeding x 1 episode. Went throught menopause in 2017, no bleeding until April this years. Had three days of bleeding like a period with mild abdominal pain. No other episodes.  She denies any current abnormal vaginal discharge, bleeding, pelvic pain or other concerns.   Past Medical History:  Diagnosis Date  . Anemia   . Antithrombin III deficiency (HCC)    ?pt not sure if true diagnosis  . Anxiety   . Apical mural thrombus   . Blood transfusion   . Cholecystitis 07/2016  . Coronary artery disease    a. apical LAD infarction '00. b. NSTEMI s/p BMS to prox LAD '09. c. Cath 01/2015: stable LAD stent, otherwise minimal nonobstructive CAD.  Marland Kitchen GERD (gastroesophageal reflux disease)   . Hyperlipidemia   . Hypertension   . Myocardial infarction (HCC)   . Noncompliance with medication regimen    a. h/o noncompliance with med regimen (previous running out of Coumadin).  . 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 Stroke (HCC)    assoc with short term memory loss and right peripheral vision loss; age 2   . TIA (transient ischemic attack) 2010  . Tobacco abuse     Past Surgical History:  Procedure Laterality Date  . CARDIAC CATHETERIZATION    . CARDIAC CATHETERIZATION N/A 01/29/2015   Procedure: Left Heart Cath and Coronary Angiography;  Surgeon: Peter M Swaziland, MD;  Location: [facility] INVASIVE CV LAB;  Service: Cardiovascular;  Laterality: N/A;  . CHOLECYSTECTOMY N/A 08/14/2016   Procedure: LAPAROSCOPIC CHOLECYSTECTOMY;  Surgeon: Gaynelle Adu, MD;  Location: [facility] OR;  Service: General;  Laterality: N/A;  . COLONOSCOPY N/A 03/29/2014   Procedure: COLONOSCOPY;  Surgeon: Louis Meckel, MD;  Location: [facility] ENDOSCOPY;  Service: Endoscopy;  Laterality: N/A;  . CORONARY ANGIOPLASTY    . ESOPHAGOGASTRODUODENOSCOPY N/A 06/09/2012   Procedure: ESOPHAGOGASTRODUODENOSCOPY (EGD);  Surgeon: Theda Belfast, MD;   Location: [facility] ENDOSCOPY;  Service: Endoscopy;  Laterality: N/A;  . ESOPHAGOGASTRODUODENOSCOPY N/A 08/02/2013   Procedure: ESOPHAGOGASTRODUODENOSCOPY (EGD);  Surgeon: Meryl Dare, MD;  Location[facility] ENDOSCOPY;  Service: Endoscopy;  Laterality: N/A;  . IR ANGIO INTRA EXTRACRAN SEL COM CAROTID INNOMINATE UNI R MOD SED  07/08/2017  . LEFT HEART CATHETERIZATION WITH CORONARY ANGIOGRAM N/A 12/24/2011   Procedure: LEFT HEART CATHETERIZATION WITH CORONARY ANGIOGRAM;  Surgeon: Kathleene Hazel, MD;  Location: [facility] CATH LAB;  Service: Cardiovascular;  Laterality: N/A;  . RADIOLOGY WITH ANESTHESIA N/A 07/08/2017   Procedure: IR WITH ANESTHESIA;  Surgeon: Radiologist, Medication, MD;  Location: [facility] OR;  Service: Radiology;  Laterality: N/A;  . TUBAL LIGATION      The following portions of the patient's history were reviewed and updated as appropriate: allergies, current medications, past family history, past medical history, past social history, past surgical history and problem list.   Health Maintenance:  Normal pap and mammogram several years ago.    Review of Systems:  Pertinent items noted in HPI and remainder of comprehensive ROS otherwise negative.  Objective:  Physical Exam BP (!) 90/41   Pulse 65   Ht 5\' 6"  (1.676 m)   Wt 170 lb 14.4 oz (77.5 kg)   BMI 27.58 kg/m  CONSTITUTIONAL: Well-developed, well-nourished female in no acute distress.  NECK: Normal range of motion, supple, no masses.  Normal thyroid.  SKIN: Skin is warm and dry. No rash noted. Not diaphoretic. No erythema. No pallor. NEUROLOGIC: Alert and  oriented to person, place, and time. Normal reflexes, muscle tone coordination. No cranial nerve deficit noted. PSYCHIATRIC: Normal mood and affect. Normal behavior. Normal judgment and thought content. CARDIOVASCULAR: Normal heart rate noted RESPIRATORY: Effort and breath sounds normal, no problems with respiration noted. ABDOMEN: Soft, normal bowel sounds, no distention noted.   No tenderness, rebound or guarding.  PELVIC: Normal appearing external genitalia; normal appearing vaginal mucosa and cervix.  Normal appearing discharge, testing sample obtained.  Pap smear obtained.  Normal uterine size, no other palpable masses, no uterine or adnexal tenderness. MUSCULOSKELETAL: Normal range of motion. No tenderness.  No cyanosis, clubbing, or edema.  2+ distal pulses.   Labs and Imaging Results for orders placed or performed in visit on 09/23/17 (from the past 168 hour(s))  Pregnancy, urine POC   Collection Time: 09/23/17  2:32 PM  Result Value Ref Range   Preg Test, Ur NEGATIVE NEGATIVE   No results found.  Assessment & Plan:  1. PMB (postmenopausal bleeding) Discussed etiologies of postmenopausal bleeding, concern about precancerous/hyperplasia or cancerous etiology (5 to 10% percent of cases).   However, she was reassured that benign etiologies are the most common causes of postmenopausal bleeding.  Uterine bleeding in postmenopausal women is usually light and self-limited. Exclusion of cancer is the main objective; therefore, treatment is usually unnecessary once cancer has been excluded.  The primary goal in the diagnostic evaluation of postmenopausal women with uterine bleeding is to exclude malignancy; this can include endometrial biopsy and pelvic ultrasound.   Further diagnostic evaluation is indicated for recurrent or persistent bleeding. Declined biopsy today, will do at next visit. - Cytology - PAP - Cervicovaginal ancillary only - US PELVIC COMPLETE WITH TRANSVAGINAL; Future  2. Encounter for screening mammogram for breast cancer Screening mammogram needed - MM 3D SCREEN BREAST BILATERAL; Future   Routine preventative health maintenance measures emphasized. Please refer to After Visit Summary for other counseling recommendations.   Return in about 2 weeks (around 10/07/2017) for Endometrial Biopsy and Discuss Results of Ultrasound.   Total face-to-face  time with patient: 20 minutes.  Over 50% of encounter was spent on counseling and coordination of care.   Lindsey Collins, MD, FACOG Obstetrician & Gynecologist, [facility] for [employer], [facility]

## 2017-09-23 NOTE — Patient Instructions (Addendum)
Postmenopausal Bleeding °Postmenopausal bleeding is any bleeding after menopause. Menopause is when a woman's period stops. Any type of bleeding after menopause is concerning. It should be checked by your doctor. Any treatment will depend on the cause. °Follow these instructions at home: °Watch your condition for any changes. °· Avoid the use of tampons and douches as told by your doctor. °· Change your pads often. °· Get regular pelvic exams and Pap tests. °· Keep all appointments for tests as told by your doctor. ° °Contact a doctor if: °· Your bleeding lasts for more than 1 week. °· You have belly (abdominal) pain. °· You have bleeding after sex (intercourse). °Get help right away if: °· You have a fever, chills, a headache, dizziness, muscle aches, and bleeding. °· You have strong pain with bleeding. °· You have clumps of blood (blood clots) coming from your vagina. °· You have bleeding and need more than 1 pad an hour. °· You feel like you are going to pass out (faint). °This information is not intended to replace advice given to you by your health care provider. Make sure you discuss any questions you have with your health care provider. °Document Released: 12/11/2007 Document Revised: 08/09/2015 Document Reviewed: 09/30/2012 °Elsevier Interactive Patient Education © 2017 Elsevier Inc. ° ° ° °Endometrial Biopsy °Endometrial biopsy is a procedure in which a tissue sample is taken from inside the uterus. The sample is taken from the endometrium, which is the lining of the uterus. The tissue sample is then checked under a microscope to see if the tissue is normal or abnormal. This procedure helps to determine where you are in your menstrual cycle and how hormone levels are affecting the lining of the uterus. This procedure may also be used to evaluate uterine bleeding or to diagnose endometrial cancer, endometrial tuberculosis, polyps, or other inflammatory conditions. °Tell a health care provider about: °· Any  allergies you have. °· All medicines you are taking, including vitamins, herbs, eye drops, creams, and over-the-counter medicines. °· Any problems you or family members have had with anesthetic medicines. °· Any blood disorders you have. °· Any surgeries you have had. °· Any medical conditions you have. °· Whether you are pregnant or may be pregnant. °What are the risks? °Generally, this is a safe procedure. However, problems may occur, including: °· Bleeding. °· Pelvic infection. °· Puncture of the wall of the uterus with the biopsy device (rare). ° °What happens before the procedure? °· Keep a record of your menstrual cycles as told by your health care provider. You may need to schedule your procedure for a specific time in your cycle. °· You may want to bring a sanitary pad to wear after the procedure. °· Ask your health care provider about: °? Changing or stopping your regular medicines. This is especially important if you are taking diabetes medicines or blood thinners. °? Taking medicines such as aspirin and ibuprofen. These medicines can thin your blood. Do not take these medicines before your procedure if your health care provider instructs you not to. °· Plan to have someone take you home from the hospital or clinic. °What happens during the procedure? °· To lower your risk of infection: °? Your health care team will wash or sanitize their hands. °· You will lie on an exam table with your feet and legs supported as in a pelvic exam. °· Your health care provider will insert an instrument (speculum) into your vagina to see your cervix. °· Your cervix will be   cleansed with an antiseptic solution. °· A medicine (local anesthetic) will be used to numb the cervix. °· A forceps instrument (tenaculum) will be used to hold your cervix steady for the biopsy. °· A thin, rod-like instrument (uterine sound) will be inserted through your cervix to determine the length of your uterus and the location where the biopsy  sample will be removed. °· A thin, flexible tube (catheter) will be inserted through your cervix and into the uterus. The catheter will be used to collect the biopsy sample from your endometrial tissue. °· The catheter and speculum will then be removed, and the tissue sample will be sent to a lab for examination. °What happens after the procedure? °· You will rest in a recovery area until you are ready to go home. °· You may have mild cramping and a small amount of vaginal bleeding. This is normal. °· It is up to you to get the results of your procedure. Ask your health care provider, or the department that is doing the procedure, when your results will be ready. °Summary °· Endometrial biopsy is a procedure in which a tissue sample is taken from the endometrium, which is the lining of the uterus. °· This procedure may help to diagnose menstrual cycle problems, abnormal bleeding, or other conditions affecting the endometrium. °· Before the procedure, keep a record of your menstrual cycles as told by your health care provider. °· The tissue sample that is removed will be checked under a microscope to see if it is normal or abnormal. °This information is not intended to replace advice given to you by your health care provider. Make sure you discuss any questions you have with your health care provider. °Document Released: 07/04/2004 Document Revised: 03/19/2016 Document Reviewed: 03/19/2016 °Elsevier Interactive Patient Education © 2017 Elsevier Inc. ° °

## 2017-09-23 NOTE — Progress Notes (Signed)
States period had stopped 2017, then in April had bleeding for a few days,.

## 2017-09-25 ENCOUNTER — Other Ambulatory Visit: Payer: Self-pay | Admitting: Obstetrics & Gynecology

## 2017-09-25 ENCOUNTER — Ambulatory Visit (HOSPITAL_COMMUNITY)
Admission: RE | Admit: 2017-09-25 | Discharge: 2017-09-25 | Disposition: A | Discharge status: Home / Self Care | Payer: Medicare Other | Source: Ambulatory Visit | Attending: Obstetrics & Gynecology | Admitting: Obstetrics & Gynecology

## 2017-09-25 DIAGNOSIS — R9389 Abnormal findings on diagnostic imaging of other specified body structures: Secondary | ICD-10-CM | POA: Insufficient documentation

## 2017-09-25 DIAGNOSIS — N950 Postmenopausal bleeding: Secondary | ICD-10-CM | POA: Diagnosis not present

## 2017-09-25 DIAGNOSIS — N839 Noninflammatory disorder of ovary, fallopian tube and broad ligament, unspecified: Secondary | ICD-10-CM | POA: Insufficient documentation

## 2017-09-25 LAB — CERVICOVAGINAL ANCILLARY ONLY
BACTERIAL VAGINITIS: NEGATIVE
CHLAMYDIA, DNA PROBE: NEGATIVE
Candida vaginitis: NEGATIVE
NEISSERIA GONORRHEA: NEGATIVE
TRICH (WINDOWPATH): NEGATIVE

## 2017-09-28 LAB — CYTOLOGY - PAP
DIAGNOSIS: NEGATIVE
DIAGNOSIS: REACTIVE
HPV: NOT DETECTED

## 2017-09-28 NOTE — Progress Notes (Signed)
Cardiology Office Note   Date:  09/29/2017   ID:  Lindsey [name], DOB 26-May-1970, MRN [id number]  PCP:  Medicine, [facility]  Cardiologist:  Dr.Crenshaw  Chief Complaint  Patient presents with  . Follow-up     History of Present Illness: Lindsey [name] is a 47 y.o. female who presents for ongoing assessment and management of CAD, with hx of apical infarct in 2000, STEMI in 2009 with BMS to the proximal LAD with history of apical thrombus. Other history includes HTN, hyperlipidemia, TIA, AT III deficiency, prior tobacco abuse.   The patient has had a right MCA infarct on 07/08/2017.  On review of recent documentation by PCP, it was found that the patient had been missing doses of Xarelto and this was found the likely etiology of her CVA.  MRI of the head showed right frontal lobe and insular infarct.    She has been referred back to cardiology for management of Xarelto and Plavix as she has now had falls and is an increased risk for brain hemorrhage if she were to fall again.  She was reinforced on the importance of compliance with Xarelto and Plavix.  When last seen on 09/07/2017 she complained of recurrent constant chest pain, not relieved by rest or medications. She was undergoing PT. She was scheduled for [facility] for evaluation of her pain for progression of CAD.    Study Highlights     The left ventricular ejection fraction is mildly decreased (45-54%).  Nuclear stress EF: 54%.  No T wave inversion was noted during stress.  There was no ST segment deviation noted during stress.  Defect 1: There is a medium defect of severe severity.  Findings consistent with prior myocardial infarction.  This is a low risk study.   Medium size, severe intensity fixed apical perfusion defect, suggestive of scar. No reversible ischemia. LVEF 54% with apical dyskinesis. This is a low risk study.   She is without complaint today with the exception of dizziness and  hypotension at home. She states that her BP can drop to 80's and 90's systolic after she takes her medication. It improves later in the day up to 120's systolic but she still has mild dizziness. She is medically compliant with Plavix and Xarelto.   Past Medical History:  Diagnosis Date  . Anemia   . Antithrombin III deficiency (HCC)    ?pt not sure if true diagnosis  . Anxiety   . Apical mural thrombus   . Blood transfusion   . Cholecystitis 07/2016  . Coronary artery disease    a. apical LAD infarction '00. b. NSTEMI s/p BMS to prox LAD '09. c. Cath 01/2015: stable LAD stent, otherwise minimal nonobstructive CAD.  Marland Kitchen GERD (gastroesophageal reflux disease)   . Hyperlipidemia   . Hypertension   . Myocardial infarction (HCC)   . Noncompliance with medication regimen    a. h/o noncompliance with med regimen (previous running out of Coumadin).  . 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 Stroke (HCC)    assoc with short term memory loss and right peripheral vision loss; age 59   . TIA (transient ischemic attack) 2010  . Tobacco abuse     Past Surgical History:  Procedure Laterality Date  . CARDIAC CATHETERIZATION    . CARDIAC CATHETERIZATION N/A 01/29/2015   Procedure: Left Heart Cath and Coronary Angiography;  Surgeon: Peter M Swaziland, MD;  Location: [facility] INVASIVE CV LAB;  Service: Cardiovascular;  Laterality: N/A;  .  CHOLECYSTECTOMY N/A 08/14/2016   Procedure: LAPAROSCOPIC CHOLECYSTECTOMY;  Surgeon: Gaynelle Adu, MD;  Location: [facility] OR;  Service: General;  Laterality: N/A;  . COLONOSCOPY N/A 03/29/2014   Procedure: COLONOSCOPY;  Surgeon: Louis Meckel, MD;  Location: [facility] ENDOSCOPY;  Service: Endoscopy;  Laterality: N/A;  . CORONARY ANGIOPLASTY    . ESOPHAGOGASTRODUODENOSCOPY N/A 06/09/2012   Procedure: ESOPHAGOGASTRODUODENOSCOPY (EGD);  Surgeon: Theda Belfast, MD;  Location: [facility] ENDOSCOPY;  Service: Endoscopy;  Laterality: N/A;  . ESOPHAGOGASTRODUODENOSCOPY N/A 08/02/2013   Procedure:  ESOPHAGOGASTRODUODENOSCOPY (EGD);  Surgeon: Meryl Dare, MD;  Location: [facility] ENDOSCOPY;  Service: Endoscopy;  Laterality: N/A;  . IR ANGIO INTRA EXTRACRAN SEL COM CAROTID INNOMINATE UNI R MOD SED  07/08/2017  . LEFT HEART CATHETERIZATION WITH CORONARY ANGIOGRAM N/A 12/24/2011   Procedure: LEFT HEART CATHETERIZATION WITH CORONARY ANGIOGRAM;  Surgeon: Kathleene Hazel, MD;  Location: [facility] CATH LAB;  Service: Cardiovascular;  Laterality: N/A;  . RADIOLOGY WITH ANESTHESIA N/A 07/08/2017   Procedure: IR WITH ANESTHESIA;  Surgeon: Radiologist, Medication, MD;  Location: [facility] OR;  Service: Radiology;  Laterality: N/A;  . TUBAL LIGATION       Current Outpatient Medications  Medication Sig Dispense Refill  . acetaminophen (TYLENOL) 325 MG tablet Take 1-2 tablets (325-650 mg total) by mouth every 4 (four) hours as needed for mild pain.    Marland Kitchen amLODipine (NORVASC) 10 MG tablet Take 10 mg by mouth daily.  3  . atorvastatin (LIPITOR) 40 MG tablet Take 1 tablet (40 mg total) by mouth daily. 30 tablet 0  . carvedilol (COREG) 25 MG tablet TAKE 1 TABLET BY MOUTH TWICE DAILY WITH MEALS 60 tablet 0  . clopidogrel (PLAVIX) 75 MG tablet Take 1 tablet (75 mg total) by mouth daily. 90 tablet 4  . hydrochlorothiazide (HYDRODIURIL) 25 MG tablet Take 12.5 mg by mouth daily.  3  . nitroGLYCERIN (NITROSTAT) 0.4 MG SL tablet Place 1 tablet (0.4 mg total) under the tongue every 5 (five) minutes as needed for chest pain (up to 3 doses). 15 tablet 0  . pantoprazole (PROTONIX) 20 MG tablet Take 20 mg by mouth daily.    . pantoprazole (PROTONIX) 40 MG tablet Take 40 mg by mouth daily.  3  . vitamin B-12 (CYANOCOBALAMIN) 1000 MCG tablet Take 1 tablet (1,000 mcg total) by mouth daily. 30 tablet 0  . XARELTO 20 MG TABS tablet TAKE 1 TABLET BY MOUTH ONCE DAILY WITH  SUPPER 30 tablet 0   No current facility-administered medications for this visit.     Allergies:   Patient has no known allergies.    Social History:  The  patient  reports that she has quit smoking. Her smoking use included cigarettes. She has a 0.20 pack-year smoking history. She has never used smokeless tobacco. She reports that she drinks alcohol. She reports that she has current or past drug history. Drug: Cocaine.   Family History:  The patient's family history includes Breast cancer in her maternal aunt; Heart disease in her brother; Stroke in her father.    ROS: All other systems are reviewed and negative. Unless otherwise mentioned in H&P    PHYSICAL EXAM: VS:  BP 102/71   Pulse 77   Ht 5\' 6"  (1.676 m)   Wt 175 lb (79.4 kg)   BMI 28.25 kg/m  , BMI Body mass index is 28.25 kg/m. GEN: Well nourished, well developed, in no acute distress  HEENT: normal  Neck: no JVD, carotid bruits, or masses Cardiac: RRR; no murmurs,  rubs, or gallops,no edema  Respiratory:  CTA   GI: soft, nontender, nondistended, + BS MS: no deformity or atrophy  Skin: warm and dry, no rash Neuro:  Strength and sensation are intact Psych: euthymic mood, full affect   EKG:  Not completed during office visit.   Recent Labs: 07/14/2017: ALT 12; BUN 7; Creatinine, Ser 0.75; Potassium 3.8; Sodium 140 07/17/2017: Hemoglobin 10.6; Platelets 247    Lipid Panel    Component Value Date/Time   CHOL 147 07/09/2017 0527   TRIG 125 07/09/2017 0527   HDL 37 (L) 07/09/2017 0527   CHOLHDL 4.0 07/09/2017 0527   VLDL 25 07/09/2017 0527   LDLCALC 85 07/09/2017 0527      Wt Readings from Last 3 Encounters:  09/29/17 175 lb (79.4 kg)  09/23/17 170 lb 14.4 oz (77.5 kg)  09/15/17 171 lb (77.6 kg)      Other studies Reviewed:  Echocardiogram 07/09/2017  Left ventricle: The cavity size was normal. Wall thickness was   increased in a pattern of mild LVH. Systolic function was normal.   The estimated ejection fraction was in the range of 55% to 60%.   Akinesis of the apical myocardium. Doppler parameters are   consistent with abnormal left ventricular relaxation  (grade 1   diastolic dysfunction). There was a medium-sized, fixed,   apicalthrombus. - Left atrium: The atrium was mildly dilated.    ASSESSMENT AND PLAN:  1. CAD: BMS to the LAD with hx of apical thrombus. She has had stress test with low risk study results. She will continue current regimen for secondary prevention with BB and statin therapy.   2. Hx of MCA Infarct: She continues on Xarelto and Plavix. She is now compliant.   3. Hypercholesterolemia:  Continue statin therapy.   4. Hypertension: She is having periods of dizziness and hypotension. I will reduced the HCTZ from 25 mg daily to 12.5 mg daily. If she notices higher BP or worsening LEE attributed to amlodipine, she may take the full 25 mg dose that day.    Current medicines are reviewed at length with the patient today.    Labs/ tests ordered today include: None   Bettey Mare. Liborio Nixon, ANP, AACC   09/29/2017 1:44 PM    [facility] [address] [place], [place] [address] Phone: [phone]; Fax: [phone]

## 2017-09-29 ENCOUNTER — Ambulatory Visit: Payer: Medicaid Other | Admitting: Adult Health

## 2017-09-29 ENCOUNTER — Encounter: Payer: Self-pay | Admitting: Adult Health

## 2017-09-29 VITALS — BP 102/71 | HR 77 | Ht 66.0 in | Wt 175.0 lb

## 2017-09-29 DIAGNOSIS — I63419 Cerebral infarction due to embolism of unspecified middle cerebral artery: Secondary | ICD-10-CM

## 2017-09-29 DIAGNOSIS — E7800 Pure hypercholesterolemia, unspecified: Secondary | ICD-10-CM | POA: Diagnosis not present

## 2017-09-29 DIAGNOSIS — I2510 Atherosclerotic heart disease of native coronary artery without angina pectoris: Secondary | ICD-10-CM | POA: Diagnosis not present

## 2017-09-29 DIAGNOSIS — I10 Essential (primary) hypertension: Secondary | ICD-10-CM | POA: Diagnosis not present

## 2017-09-29 NOTE — Patient Instructions (Signed)
Medication Instructions:  DECAREASE HYDROCHLOROTHIAZIDE 12.5 MG DAILY (1/2 TAB)  If you need a refill on your cardiac medications before your next appointment, please call your pharmacy.  Labwork: FLP,LFT,BMET AND CBC COME BACK FASTING  HERE IN OUR OFFICE AT [facility]  Take the provided lab slips with you to the lab for your blood draw.   You will need to fast. DO NOT EAT OR DRINK PAST MIDNIGHT.     Special Instructions: MAKE SURE TO CALL AND LET us KNOW WHAT YOUR PROTONIX DOSE IS  Follow-Up: Your physician wants you to follow-up in: 6 MONTHS WITH DR Jens Som. You should receive a reminder letter in the mail two months in advance. If you do not receive a letter, please call our office NOV 2019 to schedule the JAN 2020 follow-up appointment.   Thank you for choosing [facility] at [facility]!!

## 2017-10-01 ENCOUNTER — Other Ambulatory Visit (HOSPITAL_COMMUNITY): Payer: Self-pay | Admitting: Family Medicine

## 2017-10-01 ENCOUNTER — Other Ambulatory Visit: Payer: Self-pay | Admitting: Physical Medicine & Rehabilitation

## 2017-10-01 ENCOUNTER — Ambulatory Visit (HOSPITAL_COMMUNITY)
Admission: RE | Admit: 2017-10-01 | Discharge: 2017-10-01 | Disposition: A | Discharge status: Home / Self Care | Payer: Medicare Other | Source: Ambulatory Visit | Attending: Family Medicine | Admitting: Family Medicine

## 2017-10-01 ENCOUNTER — Emergency Department (HOSPITAL_COMMUNITY)
Admission: EM | Admit: 2017-10-01 | Discharge: 2017-10-01 | Discharge status: Against Medical Advice | Payer: Medicare Other | Attending: Emergency Medicine | Admitting: Emergency Medicine

## 2017-10-01 DIAGNOSIS — M25519 Pain in unspecified shoulder: Secondary | ICD-10-CM | POA: Diagnosis present

## 2017-10-01 DIAGNOSIS — Z5321 Procedure and treatment not carried out due to patient leaving prior to being seen by health care provider: Secondary | ICD-10-CM | POA: Diagnosis not present

## 2017-10-01 DIAGNOSIS — M25512 Pain in left shoulder: Secondary | ICD-10-CM | POA: Diagnosis not present

## 2017-10-01 DIAGNOSIS — R52 Pain, unspecified: Secondary | ICD-10-CM

## 2017-10-01 NOTE — ED Notes (Signed)
Pt has order from her PCP for out pt xray. Radiology is taking patient as out pt, so dismissing this patient

## 2017-10-01 NOTE — Telephone Encounter (Signed)
Recieved electronic medication refill request for Xarelto, only mention of it in the last clinical note was:  Dr. Pearlean Brownie felt that stroke was embolic due to non-compliance with Xarelto related   Not sure if ok to refill this medication, please advise.

## 2017-10-12 ENCOUNTER — Encounter: Payer: Self-pay | Admitting: Physical Therapy

## 2017-10-12 NOTE — Therapy (Signed)
[facility] [address] [place], [place], [address] Phone: [phone]   Fax:  [phone]  Patient Details  Name: Lindsey [name] MRN: [id number] Date of Birth: [birth year] Referring Provider:  Alysia Penna, MD  Encounter Date: 10/12/2017  PHYSICAL THERAPY DISCHARGE SUMMARY  Visits from Start of Care: 6  Current functional level related to goals / functional outcomes: PT Long Term Goals - 09/10/17 1800      PT LONG TERM GOAL #1   Title  Patient verbalizes & demonstrates ongoing HEP/ fitness plan. (All LTGs Target Date: 7th visit)    Baseline  09/10/17: met today    Status  Achieved      PT LONG TERM GOAL #2   Title  Berg Balance </= 54/56    Baseline  MET 08/17/2017  Berg Balance 56/56    Status  Achieved      PT LONG TERM GOAL #3   Title  Cognitive (simple naming task A-Z) Timed Up-Go <15.5 seconds     Baseline  09/10/17: 8.31 sec's no AD    Status  Achieved      PT LONG TERM GOAL #4   Title  Functional Gait Assessment >/= 19/30    Baseline  09/10/17: 28/30 scored today    Status  Achieved      PT LONG TERM GOAL #5   Title  Patient ambulates 1000' outdoors including grass, ramps, curbs & stairs single rail without device modified independent.     Baseline  09/10/17: met today    Status  Achieved         Remaining deficits: See above Patient continues to have hip pain.    Education / Equipment: HEP  Plan: Patient agrees to discharge.  Patient goals were met. Patient is being discharged due to meeting the stated rehab goals.  ?????         Laylani Pudwill PT, DPT 10/12/2017, 2:17 PM  [facility] [address] [place], [place], [address] Phone: [phone]   Fax:  [phone]

## 2017-10-14 ENCOUNTER — Ambulatory Visit
Admission: RE | Admit: 2017-10-14 | Discharge: 2017-10-14 | Disposition: A | Discharge status: Home / Self Care | Payer: Medicaid Other | Source: Ambulatory Visit | Attending: Obstetrics & Gynecology | Admitting: Obstetrics & Gynecology

## 2017-10-14 DIAGNOSIS — Z1231 Encounter for screening mammogram for malignant neoplasm of breast: Secondary | ICD-10-CM

## 2017-10-19 ENCOUNTER — Ambulatory Visit: Payer: Self-pay | Admitting: Physical Medicine & Rehabilitation

## 2017-10-19 ENCOUNTER — Encounter: Payer: Medicare Other | Attending: Physical Medicine & Rehabilitation

## 2017-10-19 DIAGNOSIS — F17210 Nicotine dependence, cigarettes, uncomplicated: Secondary | ICD-10-CM | POA: Insufficient documentation

## 2017-10-19 DIAGNOSIS — Z803 Family history of malignant neoplasm of breast: Secondary | ICD-10-CM | POA: Insufficient documentation

## 2017-10-19 DIAGNOSIS — Z8249 Family history of ischemic heart disease and other diseases of the circulatory system: Secondary | ICD-10-CM | POA: Insufficient documentation

## 2017-10-19 DIAGNOSIS — K219 Gastro-esophageal reflux disease without esophagitis: Secondary | ICD-10-CM | POA: Insufficient documentation

## 2017-10-19 DIAGNOSIS — I10 Essential (primary) hypertension: Secondary | ICD-10-CM | POA: Insufficient documentation

## 2017-10-19 DIAGNOSIS — I63511 Cerebral infarction due to unspecified occlusion or stenosis of right middle cerebral artery: Secondary | ICD-10-CM | POA: Insufficient documentation

## 2017-10-19 DIAGNOSIS — Z9049 Acquired absence of other specified parts of digestive tract: Secondary | ICD-10-CM | POA: Insufficient documentation

## 2017-10-19 DIAGNOSIS — I69354 Hemiplegia and hemiparesis following cerebral infarction affecting left non-dominant side: Secondary | ICD-10-CM | POA: Insufficient documentation

## 2017-10-19 DIAGNOSIS — Z9889 Other specified postprocedural states: Secondary | ICD-10-CM | POA: Insufficient documentation

## 2017-10-19 DIAGNOSIS — I2510 Atherosclerotic heart disease of native coronary artery without angina pectoris: Secondary | ICD-10-CM | POA: Insufficient documentation

## 2017-10-19 DIAGNOSIS — Z9851 Tubal ligation status: Secondary | ICD-10-CM | POA: Insufficient documentation

## 2017-10-19 DIAGNOSIS — E785 Hyperlipidemia, unspecified: Secondary | ICD-10-CM | POA: Insufficient documentation

## 2017-10-19 DIAGNOSIS — I252 Old myocardial infarction: Secondary | ICD-10-CM | POA: Insufficient documentation

## 2017-10-19 DIAGNOSIS — Z9114 Patient's other noncompliance with medication regimen: Secondary | ICD-10-CM | POA: Insufficient documentation

## 2017-10-19 DIAGNOSIS — G441 Vascular headache, not elsewhere classified: Secondary | ICD-10-CM | POA: Insufficient documentation

## 2017-10-19 DIAGNOSIS — F419 Anxiety disorder, unspecified: Secondary | ICD-10-CM | POA: Insufficient documentation

## 2017-10-20 ENCOUNTER — Encounter: Payer: Self-pay | Admitting: Obstetrics & Gynecology

## 2017-10-20 ENCOUNTER — Ambulatory Visit (INDEPENDENT_AMBULATORY_CARE_PROVIDER_SITE_OTHER): Payer: Medicare Other | Admitting: Obstetrics & Gynecology

## 2017-10-20 ENCOUNTER — Other Ambulatory Visit (HOSPITAL_COMMUNITY)
Admission: RE | Admit: 2017-10-20 | Discharge: 2017-10-20 | Disposition: A | Discharge status: Home / Self Care | Payer: Medicare Other | Source: Ambulatory Visit | Attending: Obstetrics & Gynecology | Admitting: Obstetrics & Gynecology

## 2017-10-20 VITALS — BP 111/81 | HR 77 | Ht 66.0 in | Wt 175.0 lb

## 2017-10-20 DIAGNOSIS — N950 Postmenopausal bleeding: Secondary | ICD-10-CM | POA: Insufficient documentation

## 2017-10-20 NOTE — Patient Instructions (Signed)
ENDOMETRIAL BIOPSY POST-PROCEDURE INSTRUCTIONS  1. You may take Ibuprofen, Aleve or Tylenol for pain if needed.  Cramping should resolve within in 24 hours.  2. You may have a small amount of spotting.  You should wear a mini pad for the next few days.  3. You may have intercourse after 24 hours.  4. You need to call if you have any pelvic pain, fever, heavy bleeding or foul smelling vaginal discharge.  5. Shower or bathe as normal  6. We will contact you with the results.     MyChart allows you to send messages to your doctor, view your lab results (as released by your physician), manage appointments, and more.   If you have questions, you can call [phone] ([phone]) to talk to our MyChart staff. Remember, MyChart is NOT to be used for urgent needs. For medical emergencies, dial 911.

## 2017-10-20 NOTE — Progress Notes (Signed)
GYNECOLOGY OFFICE VISIT NOTE  History:  47 y.o. [id number] here today for follow up after evaluation for PMB.  Seen on 09/23/2017, had negative pap smear and was scheduled for ultrasound. She is here to review results of her ultrasound. She denies any further abnormal vaginal discharge, bleeding, pelvic pain or other concerns.   Past Medical History:  Diagnosis Date  . Anemia   . Antithrombin III deficiency (HCC)    ?pt not sure if true diagnosis  . Anxiety   . Apical mural thrombus   . Blood transfusion   . Cholecystitis 07/2016  . Coronary artery disease    a. apical LAD infarction '00. b. NSTEMI s/p BMS to prox LAD '09. c. Cath 01/2015: stable LAD stent, otherwise minimal nonobstructive CAD.  Marland Kitchen GERD (gastroesophageal reflux disease)   . Hyperlipidemia   . Hypertension   . Myocardial infarction (HCC)   . Noncompliance with medication regimen    a. h/o noncompliance with med regimen (previous running out of Coumadin).  . 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 Stroke (HCC)    assoc with short term memory loss and right peripheral vision loss; age 1   . TIA (transient ischemic attack) 2010  . Tobacco abuse     Past Surgical History:  Procedure Laterality Date  . CARDIAC CATHETERIZATION    . CARDIAC CATHETERIZATION N/A 01/29/2015   Procedure: Left Heart Cath and Coronary Angiography;  Surgeon: Peter M Swaziland, MD;  Location: [facility] INVASIVE CV LAB;  Service: Cardiovascular;  Laterality: N/A;  . CHOLECYSTECTOMY N/A 08/14/2016   Procedure: LAPAROSCOPIC CHOLECYSTECTOMY;  Surgeon: Gaynelle Adu, MD;  Location: [facility] OR;  Service: General;  Laterality: N/A;  . COLONOSCOPY N/A 03/29/2014   Procedure: COLONOSCOPY;  Surgeon: Louis Meckel, MD;  Location: [facility] ENDOSCOPY;  Service: Endoscopy;  Laterality: N/A;  . CORONARY ANGIOPLASTY    . ESOPHAGOGASTRODUODENOSCOPY N/A 06/09/2012   Procedure: ESOPHAGOGASTRODUODENOSCOPY (EGD);  Surgeon: Theda Belfast, MD;  Location: [facility] ENDOSCOPY;  Service: Endoscopy;   Laterality: N/A;  . ESOPHAGOGASTRODUODENOSCOPY N/A 08/02/2013   Procedure: ESOPHAGOGASTRODUODENOSCOPY (EGD);  Surgeon: Meryl Dare, MD;  Location: [facility] ENDOSCOPY;  Service: Endoscopy;  Laterality: N/A;  . IR ANGIO INTRA EXTRACRAN SEL COM CAROTID INNOMINATE UNI R MOD SED  07/08/2017  . LEFT HEART CATHETERIZATION WITH CORONARY ANGIOGRAM N/A 12/24/2011   Procedure: LEFT HEART CATHETERIZATION WITH CORONARY ANGIOGRAM;  Surgeon: Kathleene Hazel, MD;  Location: [facility] CATH LAB;  Service: Cardiovascular;  Laterality: N/A;  . RADIOLOGY WITH ANESTHESIA N/A 07/08/2017   Procedure: IR WITH ANESTHESIA;  Surgeon: Radiologist, Medication, MD;  Location: [facility] OR;  Service: Radiology;  Laterality: N/A;  . TUBAL LIGATION      The following portions of the patient's history were reviewed and updated as appropriate: allergies, current medications, past family history, past medical history, past social history, past surgical history and problem list.   Health Maintenance:  Normal pap and negative HRHPV on 09/23/2017.  Normal mammogram on 10/14/2017.   Review of Systems:  Pertinent items noted in HPI and remainder of comprehensive ROS otherwise negative.  Objective:  Physical Exam BP 111/81   Pulse 77   Ht 5\' 6"  (1.676 m)   Wt 175 lb (79.4 kg)   BMI 28.25 kg/m  CONSTITUTIONAL: Well-developed, well-nourished female in no acute distress.  HEENT:  Normocephalic, atraumatic. External right and left ear normal. No scleral icterus.  NECK: Normal range of motion, supple, no masses noted on observation SKIN: Skin is warm and dry. No rash noted. Not  diaphoretic. No erythema. No pallor. MUSCULOSKELETAL: Normal range of motion. No edema noted. NEUROLOGIC: Alert and oriented to person, place, and time. Normal muscle tone coordination. No cranial nerve deficit noted. PSYCHIATRIC: Normal mood and affect. Normal behavior. Normal judgment and thought content. CARDIOVASCULAR: Normal heart rate noted RESPIRATORY: Effort and  breath sounds normal, no problems with respiration noted ABDOMEN: Soft, no distention noted.   PELVIC: Normal appearing external genitalia; normal appearing vaginal mucosa and cervix.  No abnormal discharge noted.  Normal uterine size, no other palpable masses, no uterine or adnexal tenderness.   ENDOMETRIAL BIOPSY     The indications for endometrial biopsy were reviewed.   Risks of the biopsy including cramping, bleeding, infection, uterine perforation, inadequate specimen and need for additional procedures  were discussed. The patient states she understands and agrees to undergo procedure today. Consent was signed. Time out was performed. Urine HCG was negative. During the pelvic exam, the cervix was prepped with Betadine. A single-toothed tenaculum was placed on the anterior lip of the cervix to stabilize it. The 3 mm pipelle was introduced into the endometrial cavity without difficulty to a depth of 8 cm, and a small amount of tissue was obtained and sent to pathology. The instruments were removed from the patient's vagina. Minimal bleeding from the cervix was noted. The patient tolerated the procedure well. Routine post-procedure instructions were given to the patient.     Labs and Imaging  US Pelvis Transvanginal Non-ob (tv Only)  Result Date: 09/25/2017 CLINICAL DATA:  Postmenopausal bleeding. EXAM: ULTRASOUND PELVIS TRANSVAGINAL TECHNIQUE: Transvaginal ultrasound examination of the pelvis was performed including evaluation of the uterus, ovaries, adnexal regions, and pelvic cul-de-sac. COMPARISON:  06/23/2017 FINDINGS: Uterus Measurements: 8.5 x 4.4 x 5.1 cm. Heterogeneous myometrial echotexture noted. Probable 1.2 cm intramural fibroid in the anterior fundus. Endometrium Thickness: 4 mm.  No focal abnormality visualized. Right ovary Measurements: 3.1 x 1.8 x 1.9 cm. Normal appearance of right ovary. An ovoid simple appearing cyst is seen between the right ovary and uterus which measures 2.2 x  0.9 x 0.9 cm. This likely represents a paraovarian cyst or mild hydrosalpinx. Left ovary Measurements: 2.6 x 2.1 x 2.2 cm. Normal appearance/no adnexal mass. Other findings:  No abnormal free fluid IMPRESSION: Probable 1.2 cm intramural fibroid in anterior fundus. Endometrial thickness measures 4 mm. In the setting of post-menopausal bleeding, this is consistent with a benign etiology such as endometrial atrophy. If bleeding remains unresponsive to hormonal or medical therapy, sonohysterogram should be considered for focal lesion work-up. (Ref: Radiological Reasoning: Algorithmic Workup of Abnormal Vaginal Bleeding with Endovaginal Sonography and Sonohysterography. AJR 2008; 742:V95-63). 2.2 cm benign-appearing ovoid cyst in right adnexa, likely representing a paraovarian cyst or mild hydrosalpinx. Yearly follow-up by ultrasound is recommended in a postmenopausal female. This recommendation follows the consensus statement: Management of Asymptomatic Ovarian and Other Adnexal Cysts Imaged at [place]: Society of Radiologists in Ultrasound Consensus Conference Statement. Radiology 2010; [phone]. Electronically Signed   By: Myles Rosenthal M.D.   On: 09/25/2017 12:02   Dg Shoulder Left  Result Date: 10/02/2017 CLINICAL DATA:  Left shoulder pain.  Fall. EXAM: LEFT SHOULDER - 2+ VIEW COMPARISON:  No prior. FINDINGS: Corticated bony density noted adjacent to the acromion most likely an os acromiale. No evidence of acute fracture, dislocation, or separation. No focal rib abnormalities identified. Visualized left lung is clear. No pneumothorax noted. IMPRESSION: Corticated bony density noted adjacent to the acromion most likely an os acromiale. No evidence of acute fracture, dislocation,  or separation. Electronically Signed   By: Maisie Fus  Register   On: 10/02/2017 10:03   Mm 3d Screen Breast Bilateral  Result Date: 10/14/2017 CLINICAL DATA:  Screening. This is the patient's initial baseline mammogram. EXAM: DIGITAL  SCREENING BILATERAL MAMMOGRAM WITH TOMO AND CAD COMPARISON:  None. ACR Breast Density Category c: The breast tissue is heterogeneously dense, which may obscure small masses FINDINGS: There are no findings suspicious for malignancy. Images were processed with CAD. IMPRESSION: No mammographic evidence of malignancy. A result letter of this screening mammogram will be mailed directly to the patient. RECOMMENDATION: Screening mammogram in one year. (Code:SM-B-01Y) BI-RADS CATEGORY  1: Negative. Electronically Signed   By: Hulan Saas M.D.   On: 10/14/2017 16:50    Assessment & Plan:  1. PMB (postmenopausal bleeding) Benign endometrial biopsy and thin EM stripe.  Will follow up biopsy results and manage accordingly.  Bleeding likely due to atrophy, patient reassured. Most likely self-limited course. If recurs or worsens, may need further management.  Routine preventative health maintenance measures emphasized. Please refer to After Visit Summary for other counseling recommendations.   Return for any gynecologic concerns.   Total face-to-face time with patient: 20 minutes.  Over 50% of encounter was spent on counseling and coordination of care.   Jaynie Collins, MD, FACOG Obstetrician & Gynecologist, [facility] for [employer], [facility]

## 2017-10-20 NOTE — Addendum Note (Signed)
Addended by: Faythe Casa on: 10/20/2017 06:48 PM   Modules accepted: Orders

## 2017-10-20 NOTE — Addendum Note (Signed)
Addended by: Faythe Casa on: 10/20/2017 06:24 PM   Modules accepted: Orders

## 2017-11-03 ENCOUNTER — Other Ambulatory Visit: Payer: Self-pay | Admitting: Physical Medicine & Rehabilitation

## 2017-11-04 NOTE — Telephone Encounter (Signed)
PCP needs to Rx

## 2017-11-04 NOTE — Telephone Encounter (Signed)
Recieved electronic medication refill request for Xarelto, no mention of this medicaiton in previous notes since patient discharge from hospital.  Unsure if ok to refill this medication, please advise.

## 2017-11-06 ENCOUNTER — Ambulatory Visit: Payer: Medicaid Other | Admitting: Physical Medicine & Rehabilitation

## 2017-11-06 ENCOUNTER — Encounter: Payer: Self-pay | Admitting: Physical Medicine & Rehabilitation

## 2017-11-06 DIAGNOSIS — G8929 Other chronic pain: Secondary | ICD-10-CM | POA: Diagnosis not present

## 2017-11-06 DIAGNOSIS — I69354 Hemiplegia and hemiparesis following cerebral infarction affecting left non-dominant side: Secondary | ICD-10-CM | POA: Diagnosis not present

## 2017-11-06 DIAGNOSIS — Z9851 Tubal ligation status: Secondary | ICD-10-CM | POA: Diagnosis not present

## 2017-11-06 DIAGNOSIS — E785 Hyperlipidemia, unspecified: Secondary | ICD-10-CM | POA: Diagnosis not present

## 2017-11-06 DIAGNOSIS — F419 Anxiety disorder, unspecified: Secondary | ICD-10-CM | POA: Diagnosis not present

## 2017-11-06 DIAGNOSIS — M25512 Pain in left shoulder: Secondary | ICD-10-CM

## 2017-11-06 DIAGNOSIS — F17210 Nicotine dependence, cigarettes, uncomplicated: Secondary | ICD-10-CM | POA: Diagnosis not present

## 2017-11-06 DIAGNOSIS — I2510 Atherosclerotic heart disease of native coronary artery without angina pectoris: Secondary | ICD-10-CM | POA: Diagnosis not present

## 2017-11-06 DIAGNOSIS — Z8249 Family history of ischemic heart disease and other diseases of the circulatory system: Secondary | ICD-10-CM | POA: Diagnosis not present

## 2017-11-06 DIAGNOSIS — Z803 Family history of malignant neoplasm of breast: Secondary | ICD-10-CM | POA: Diagnosis not present

## 2017-11-06 DIAGNOSIS — I63511 Cerebral infarction due to unspecified occlusion or stenosis of right middle cerebral artery: Secondary | ICD-10-CM | POA: Diagnosis not present

## 2017-11-06 DIAGNOSIS — Z9114 Patient's other noncompliance with medication regimen: Secondary | ICD-10-CM | POA: Diagnosis not present

## 2017-11-06 DIAGNOSIS — Z9049 Acquired absence of other specified parts of digestive tract: Secondary | ICD-10-CM | POA: Diagnosis not present

## 2017-11-06 DIAGNOSIS — I252 Old myocardial infarction: Secondary | ICD-10-CM | POA: Diagnosis not present

## 2017-11-06 DIAGNOSIS — K219 Gastro-esophageal reflux disease without esophagitis: Secondary | ICD-10-CM | POA: Diagnosis not present

## 2017-11-06 DIAGNOSIS — I10 Essential (primary) hypertension: Secondary | ICD-10-CM | POA: Diagnosis not present

## 2017-11-06 DIAGNOSIS — G441 Vascular headache, not elsewhere classified: Secondary | ICD-10-CM | POA: Diagnosis not present

## 2017-11-06 DIAGNOSIS — Z9889 Other specified postprocedural states: Secondary | ICD-10-CM | POA: Diagnosis not present

## 2017-11-06 MED ORDER — DIAZEPAM 5 MG PO TABS: 5.0000 mg | ORAL_TABLET | Freq: Once | ORAL | 0 refills | Status: AC

## 2017-11-06 NOTE — Progress Notes (Signed)
Subjective:    Patient ID: Lindsey [name], female    DOB: 02-Aug-1970, 47 y.o.   MRN: [id number]  47 year old right handed female with history of apical mural thrombus with CVA 2016 and medication non-compliance who was admitted on 07/08/17 with sudden onset of left facial weakness and LUE weakness. CTA brain revealed occluded superior of mid 1/3 division for R-MCA with spontaneous recanalization therefore no endovascular treatment recommended. MRI brain revealed right lateral frontal and insula early/subacute infarct without hemorrhage. 2D echo showed fixed, medium apical thrombus with mild LVH and EF 55-60%. Dr. Pearlean Brownie felt that stroke was embolic due to non-compliance with Xarelto related  Admit date: 07/13/2017 Discharge date: 07/21/2017 HPI Left shoulder pain since fall in June, Xrays performed in October 01 2017 negative for fracture , xrays reviewed no significant OA  Pt with pain during overhead activity  Some pain radiating to trap as well as L arm and forearm No numbness and tingle complaints  Pain Inventory Average Pain 6 Pain Right Now 6 My pain is sharp, burning and aching  In the last 24 hours, has pain interfered with the following? General activity 7 Relation with others 7 Enjoyment of life 7 What TIME of day is your pain at its worst? morning, night  Sleep (in general) Poor  Pain is worse with: unsure Pain improves with: nothing Relief from Meds: 0  Mobility walk without assistance how many minutes can you walk? 20 ability to climb steps?  yes do you drive?  no  Function not employed: date last employed . disabled: date disabled filed for disability  Neuro/Psych loss of taste or smell  Prior Studies Any changes since last visit?  no  Physicians involved in your care Any changes since last visit?  no   Family History  Problem Relation Age of Onset  . Stroke Father   . Heart disease Brother        arrhythmia; died  . Breast cancer Maternal Aunt     Social History   Socioeconomic History  . Marital status: Divorced    Spouse name: Not on file  . Number of children: 2  . Years of education: Not on file  . Highest education level: Not on file  Occupational History  . Occupation: Geneticist, molecular for [employer]  Social Needs  . Financial resource strain: Not on file  . Food insecurity:    Worry: Not on file    Inability: Not on file  . Transportation needs:    Medical: Not on file    Non-medical: Not on file  Tobacco Use  . Smoking status: Former Smoker    Packs/day: 0.20    Years: 1.00    Pack years: 0.20    Types: Cigarettes  . Smokeless tobacco: Never Used  . Tobacco comment: 2018  " i STILL SMOKE 3  CIGARETTES A DAY "  Substance and Sexual Activity  . Alcohol use: Yes    Alcohol/week: 0.0 standard drinks    Comment: Social drinker  . Drug use: Not Currently    Types: Cocaine    Comment: hx of use x 1 in the past  . Sexual activity: Never    Birth control/protection: Surgical  Lifestyle  . Physical activity:    Days per week: Not on file    Minutes per session: Not on file  . Stress: Not on file  Relationships  . Social connections:    Talks on phone: Not on file  Gets together: Not on file    Attends religious service: Not on file    Active member of club or organization: Not on file    Attends meetings of clubs or organizations: Not on file    Relationship status: Not on file  Other Topics Concern  . Not on file  Social History Narrative  . Not on file   Past Surgical History:  Procedure Laterality Date  . CARDIAC CATHETERIZATION    . CARDIAC CATHETERIZATION N/A 01/29/2015   Procedure: Left Heart Cath and Coronary Angiography;  Surgeon: Peter M Swaziland, MD;  Location: [facility] INVASIVE CV LAB;  Service: Cardiovascular;  Laterality: N/A;  . CHOLECYSTECTOMY N/A 08/14/2016   Procedure: LAPAROSCOPIC CHOLECYSTECTOMY;  Surgeon: Gaynelle Adu, MD;  Location: [facility] OR;  Service: General;  Laterality: N/A;   . COLONOSCOPY N/A 03/29/2014   Procedure: COLONOSCOPY;  Surgeon: Louis Meckel, MD;  Location: [facility] ENDOSCOPY;  Service: Endoscopy;  Laterality: N/A;  . CORONARY ANGIOPLASTY    . ESOPHAGOGASTRODUODENOSCOPY N/A 06/09/2012   Procedure: ESOPHAGOGASTRODUODENOSCOPY (EGD);  Surgeon: Theda Belfast, MD;  Location: [facility] ENDOSCOPY;  Service: Endoscopy;  Laterality: N/A;  . ESOPHAGOGASTRODUODENOSCOPY N/A 08/02/2013   Procedure: ESOPHAGOGASTRODUODENOSCOPY (EGD);  Surgeon: Meryl Dare, MD;  Location: [facility] ENDOSCOPY;  Service: Endoscopy;  Laterality: N/A;  . IR ANGIO INTRA EXTRACRAN SEL COM CAROTID INNOMINATE UNI R MOD SED  07/08/2017  . LEFT HEART CATHETERIZATION WITH CORONARY ANGIOGRAM N/A 12/24/2011   Procedure: LEFT HEART CATHETERIZATION WITH CORONARY ANGIOGRAM;  Surgeon: Kathleene Hazel, MD;  Location: [facility] CATH LAB;  Service: Cardiovascular;  Laterality: N/A;  . RADIOLOGY WITH ANESTHESIA N/A 07/08/2017   Procedure: IR WITH ANESTHESIA;  Surgeon: Radiologist, Medication, MD;  Location: [facility] OR;  Service: Radiology;  Laterality: N/A;  . TUBAL LIGATION     Past Medical History:  Diagnosis Date  . Anemia   . Antithrombin III deficiency (HCC)    ?pt not sure if true diagnosis  . Anxiety   . Apical mural thrombus   . Blood transfusion   . Cholecystitis 07/2016  . Coronary artery disease    a. apical LAD infarction '00. b. NSTEMI s/p BMS to prox LAD '09. c. Cath 01/2015: stable LAD stent, otherwise minimal nonobstructive CAD.  Marland Kitchen GERD (gastroesophageal reflux disease)   . Hyperlipidemia   . Hypertension   . Myocardial infarction (HCC)   . Noncompliance with medication regimen    a. h/o noncompliance with med regimen (previous running out of Coumadin).  . 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 Stroke (HCC)    assoc with short term memory loss and right peripheral vision loss; age 4   . TIA (transient ischemic attack) 2010  . Tobacco abuse    BP 125/90 (BP Location: Right Arm, Patient Position:  Sitting, Cuff Size: Normal)   Pulse 88   Ht 5\' 6"  (1.676 m)   Wt 181 lb (82.1 kg)   SpO2 97%   BMI 29.21 kg/m   Opioid Risk Score:   Fall Risk Score:  `1  Depression screen PHQ 2/9  Depression screen [facility] 2/9 09/23/2017 08/31/2017 08/03/2017 03/06/2015 02/20/2015 12/26/2014 09/26/2014  Decreased Interest 1 0 1 0 0 0 0  Down, Depressed, Hopeless 1 0 1 0 0 0 3  PHQ - 2 Score 2 0 2 0 0 0 3  Altered sleeping 1 - 1 - - - 1  Tired, decreased energy 1 - 1 - - - 1  Change in appetite 1 - 0 - - - 1  Feeling bad or failure about yourself  1 - 0 - - - 1  Trouble concentrating 1 - 0 - - - 1  Moving slowly or fidgety/restless - - 0 - - - 1  Suicidal thoughts 0 - 0 - - - 1  PHQ-9 Score 7 - 4 - - - 10  Difficult doing work/chores - - Not difficult at all - - - -  Some recent data might be hidden    Review of Systems  Constitutional: Negative.   HENT: Negative.   Eyes: Negative.   Respiratory: Negative.   Cardiovascular: Negative.   Gastrointestinal: Negative.   Endocrine: Negative.   Genitourinary: Negative.   Musculoskeletal: Positive for myalgias.  Skin: Negative.   Allergic/Immunologic: Negative.   Neurological: Negative.   Hematological: Negative.        Objective:   Physical Exam  Constitutional: She is oriented to person, place, and time. She appears well-developed and well-nourished. No distress.  HENT:  Head: Normocephalic and atraumatic.  Eyes: Pupils are equal, round, and reactive to light. EOM are normal.  Musculoskeletal:       Left shoulder: She exhibits decreased range of motion. She exhibits no tenderness, no effusion and no deformity.  Left shoulder positive impingement signs Pain with external rotation  No pain with elbow, wrist or finger ROM, no hypersensitivity to touch in the LUE  Shoulde rwithout subluxation no pain over [facility] joint   Neurological: She is alert and oriented to person, place, and time.  Skin: She is not diaphoretic.  Nursing note and vitals  reviewed.         Assessment & Plan:  1. LEFT shoulder pain s/p fall with normal Xrays, + impingement signs, suspect RTC, ? Partial vs complete , will order MRI since this occurred >14mo ago without improvement  2.  LEFT adhesive capsulitis, pt guarding left shoulder ROM will do Left glenohumeral injection  Shoulder injection Left glenohumeral   Indication:Left Shoulder pain not relieved by medication management and other conservative care.  Informed consent was obtained after describing risks and benefits of the procedure with the patient, this includes bleeding, bruising, infection and medication side effects. The patient wishes to proceed and has given written consent. Patient was placed in a seated position. TheLEFTshoulder was marked and prepped with betadine in the subacromial area. A 25-gauge 1-1/2 inch needle was inserted into the subacromial area. After negative draw back for blood, a solution containing 1 mL of 6 mg per ML betamethasone and 4 mL of 1% lidocaine was injected. A band aid was applied. The patient tolerated the procedure well. Post procedure instructions were given.

## 2017-11-06 NOTE — Patient Instructions (Signed)
Shoulder Exercises Ask your health care provider which exercises are safe for you. Do exercises exactly as told by your health care provider and adjust them as directed. It is normal to feel mild stretching, pulling, tightness, or discomfort as you do these exercises, but you should stop right away if you feel sudden pain or your pain gets worse.Do not begin these exercises until told by your health care provider. RANGE OF MOTION EXERCISES These exercises warm up your muscles and joints and improve the movement and flexibility of your shoulder. These exercises also help to relieve pain, numbness, and tingling. These exercises involve stretching your injured shoulder directly. Exercise A: Pendulum  1. Stand near a wall or a surface that you can hold onto for balance. 2. Bend at the waist and let your left / right arm hang straight down. Use your other arm to support you. Keep your back straight and do not lock your knees. 3. Relax your left / right arm and shoulder muscles, and move your hips and your trunk so your left / right arm swings freely. Your arm should swing because of the motion of your body, not because you are using your arm or shoulder muscles. 4. Keep moving your body so your arm swings in the following directions, as told by your health care provider: ? Side to side. ? Forward and backward. ? In clockwise and counterclockwise circles. 5. Continue each motion for __________ seconds, or for as long as told by your health care provider. 6. Slowly return to the starting position. Repeat __________ times. Complete this exercise __________ times a day. Exercise B:Flexion, Standing  1. Stand and hold a broomstick, a cane, or a similar object. Place your hands a little more than shoulder-width apart on the object. Your left / right hand should be palm-up, and your other hand should be palm-down. 2. Keep your elbow straight and keep your shoulder muscles relaxed. Push the stick down with  your healthy arm to raise your left / right arm in front of your body, and then over your head until you feel a stretch in your shoulder. ? Avoid shrugging your shoulder while you raise your arm. Keep your shoulder blade tucked down toward the middle of your back. 3. Hold for __________ seconds. 4. Slowly return to the starting position. Repeat __________ times. Complete this exercise __________ times a day. Exercise C: Abduction, Standing 1. Stand and hold a broomstick, a cane, or a similar object. Place your hands a little more than shoulder-width apart on the object. Your left / right hand should be palm-up, and your other hand should be palm-down. 2. While keeping your elbow straight and your shoulder muscles relaxed, push the stick across your body toward your left / right side. Raise your left / right arm to the side of your body and then over your head until you feel a stretch in your shoulder. ? Do not raise your arm above shoulder height, unless your health care provider tells you to do that. ? Avoid shrugging your shoulder while you raise your arm. Keep your shoulder blade tucked down toward the middle of your back. 3. Hold for __________ seconds. 4. Slowly return to the starting position. Repeat __________ times. Complete this exercise __________ times a day. Exercise D:Internal Rotation  1. Place your left / right hand behind your back, palm-up. 2. Use your other hand to dangle an exercise band, a towel, or a similar object over your shoulder. Grasp the band with   your left / right hand so you are holding onto both ends. 3. Gently pull up on the band until you feel a stretch in the front of your left / right shoulder. ? Avoid shrugging your shoulder while you raise your arm. Keep your shoulder blade tucked down toward the middle of your back. 4. Hold for __________ seconds. 5. Release the stretch by letting go of the band and lowering your hands. Repeat __________ times. Complete  this exercise __________ times a day. STRETCHING EXERCISES These exercises warm up your muscles and joints and improve the movement and flexibility of your shoulder. These exercises also help to relieve pain, numbness, and tingling. These exercises are done using your healthy shoulder to help stretch the muscles of your injured shoulder. Exercise E: Corner Stretch (External Rotation and Abduction)  1. Stand in a doorway with one of your feet slightly in front of the other. This is called a staggered stance. If you cannot reach your forearms to the door frame, stand facing a corner of a room. 2. Choose one of the following positions as told by your health care provider: ? Place your hands and forearms on the door frame above your head. ? Place your hands and forearms on the door frame at the height of your head. ? Place your hands on the door frame at the height of your elbows. 3. Slowly move your weight onto your front foot until you feel a stretch across your chest and in the front of your shoulders. Keep your head and chest upright and keep your abdominal muscles tight. 4. Hold for __________ seconds. 5. To release the stretch, shift your weight to your back foot. Repeat __________ times. Complete this stretch __________ times a day. Exercise F:Extension, Standing 1. Stand and hold a broomstick, a cane, or a similar object behind your back. ? Your hands should be a little wider than shoulder-width apart. ? Your palms should face away from your back. 2. Keeping your elbows straight and keeping your shoulder muscles relaxed, move the stick away from your body until you feel a stretch in your shoulder. ? Avoid shrugging your shoulders while you move the stick. Keep your shoulder blade tucked down toward the middle of your back. 3. Hold for __________ seconds. 4. Slowly return to the starting position. Repeat __________ times. Complete this exercise __________ times a day. STRENGTHENING  EXERCISES These exercises build strength and endurance in your shoulder. Endurance is the ability to use your muscles for a long time, even after they get tired. Exercise G:External Rotation  1. Sit in a stable chair without armrests. 2. Secure an exercise band at elbow height on your left / right side. 3. Place a soft object, such as a folded towel or a small pillow, between your left / right upper arm and your body to move your elbow a few inches away (about 10 cm) from your side. 4. Hold the end of the band so it is tight and there is no slack. 5. Keeping your elbow pressed against the soft object, move your left / right forearm out, away from your abdomen. Keep your body steady so only your forearm moves. 6. Hold for __________ seconds. 7. Slowly return to the starting position. Repeat __________ times. Complete this exercise __________ times a day. Exercise H:Shoulder Abduction  1. Sit in a stable chair without armrests, or stand. 2. Hold a __________ weight in your left / right hand, or hold an exercise band with both hands.   3. Start with your arms straight down and your left / right palm facing in, toward your body. 4. Slowly lift your left / right hand out to your side. Do not lift your hand above shoulder height unless your health care provider tells you that this is safe. ? Keep your arms straight. ? Avoid shrugging your shoulder while you do this movement. Keep your shoulder blade tucked down toward the middle of your back. 5. Hold for __________ seconds. 6. Slowly lower your arm, and return to the starting position. Repeat __________ times. Complete this exercise __________ times a day. Exercise I:Shoulder Extension 1. Sit in a stable chair without armrests, or stand. 2. Secure an exercise band to a stable object in front of you where it is at shoulder height. 3. Hold one end of the exercise band in each hand. Your palms should face each other. 4. Straighten your elbows and  lift your hands up to shoulder height. 5. Step back, away from the secured end of the exercise band, until the band is tight and there is no slack. 6. Squeeze your shoulder blades together as you pull your hands down to the sides of your thighs. Stop when your hands are straight down by your sides. Do not let your hands go behind your body. 7. Hold for __________ seconds. 8. Slowly return to the starting position. Repeat __________ times. Complete this exercise __________ times a day. Exercise J:Standing Shoulder Row 1. Sit in a stable chair without armrests, or stand. 2. Secure an exercise band to a stable object in front of you so it is at waist height. 3. Hold one end of the exercise band in each hand. Your palms should be in a thumbs-up position. 4. Bend each of your elbows to an "L" shape (about 90 degrees) and keep your upper arms at your sides. 5. Step back until the band is tight and there is no slack. 6. Slowly pull your elbows back behind you. 7. Hold for __________ seconds. 8. Slowly return to the starting position. Repeat __________ times. Complete this exercise __________ times a day. Exercise K:Shoulder Press-Ups  1. Sit in a stable chair that has armrests. Sit upright, with your feet flat on the floor. 2. Put your hands on the armrests so your elbows are bent and your fingers are pointing forward. Your hands should be about even with the sides of your body. 3. Push down on the armrests and use your arms to lift yourself off of the chair. Straighten your elbows and lift yourself up as much as you comfortably can. ? Move your shoulder blades down, and avoid letting your shoulders move up toward your ears. ? Keep your feet on the ground. As you get stronger, your feet should support less of your body weight as you lift yourself up. 4. Hold for __________ seconds. 5. Slowly lower yourself back into the chair. Repeat __________ times. Complete this exercise __________ times a  day. Exercise L: Wall Push-Ups  1. Stand so you are facing a stable wall. Your feet should be about one arm-length away from the wall. 2. Lean forward and place your palms on the wall at shoulder height. 3. Keep your feet flat on the floor as you bend your elbows and lean forward toward the wall. 4. Hold for __________ seconds. 5. Straighten your elbows to push yourself back to the starting position. Repeat __________ times. Complete this exercise __________ times a day. This information is not intended to replace advice   given to you by your health care provider. Make sure you discuss any questions you have with your health care provider. Document Released: 01/15/2005 Document Revised: 11/26/2015 Document Reviewed: 11/12/2014 Elsevier Interactive Patient Education  2018 [employer]  

## 2017-11-13 ENCOUNTER — Inpatient Hospital Stay: Admission: RE | Admit: 2017-11-13 | Payer: Self-pay | Source: Ambulatory Visit

## 2017-11-27 ENCOUNTER — Ambulatory Visit: Payer: Medicaid Other | Admitting: Physical Medicine & Rehabilitation

## 2017-11-27 ENCOUNTER — Encounter: Payer: Medicare Other | Attending: Physical Medicine & Rehabilitation

## 2017-11-27 ENCOUNTER — Other Ambulatory Visit: Payer: Self-pay

## 2017-11-27 ENCOUNTER — Encounter: Payer: Self-pay | Admitting: Physical Medicine & Rehabilitation

## 2017-11-27 VITALS — BP 118/84 | HR 78 | Ht 66.0 in | Wt 180.6 lb

## 2017-11-27 DIAGNOSIS — E785 Hyperlipidemia, unspecified: Secondary | ICD-10-CM | POA: Diagnosis not present

## 2017-11-27 DIAGNOSIS — F419 Anxiety disorder, unspecified: Secondary | ICD-10-CM | POA: Diagnosis not present

## 2017-11-27 DIAGNOSIS — I69354 Hemiplegia and hemiparesis following cerebral infarction affecting left non-dominant side: Secondary | ICD-10-CM

## 2017-11-27 DIAGNOSIS — I10 Essential (primary) hypertension: Secondary | ICD-10-CM | POA: Insufficient documentation

## 2017-11-27 DIAGNOSIS — Z8249 Family history of ischemic heart disease and other diseases of the circulatory system: Secondary | ICD-10-CM | POA: Insufficient documentation

## 2017-11-27 DIAGNOSIS — Z9851 Tubal ligation status: Secondary | ICD-10-CM | POA: Diagnosis not present

## 2017-11-27 DIAGNOSIS — I63511 Cerebral infarction due to unspecified occlusion or stenosis of right middle cerebral artery: Secondary | ICD-10-CM | POA: Insufficient documentation

## 2017-11-27 DIAGNOSIS — K219 Gastro-esophageal reflux disease without esophagitis: Secondary | ICD-10-CM | POA: Diagnosis not present

## 2017-11-27 DIAGNOSIS — Z803 Family history of malignant neoplasm of breast: Secondary | ICD-10-CM | POA: Diagnosis not present

## 2017-11-27 DIAGNOSIS — G441 Vascular headache, not elsewhere classified: Secondary | ICD-10-CM | POA: Insufficient documentation

## 2017-11-27 DIAGNOSIS — I2510 Atherosclerotic heart disease of native coronary artery without angina pectoris: Secondary | ICD-10-CM | POA: Insufficient documentation

## 2017-11-27 DIAGNOSIS — Z9889 Other specified postprocedural states: Secondary | ICD-10-CM | POA: Insufficient documentation

## 2017-11-27 DIAGNOSIS — F17210 Nicotine dependence, cigarettes, uncomplicated: Secondary | ICD-10-CM | POA: Insufficient documentation

## 2017-11-27 DIAGNOSIS — M25512 Pain in left shoulder: Secondary | ICD-10-CM | POA: Diagnosis not present

## 2017-11-27 DIAGNOSIS — Z9114 Patient's other noncompliance with medication regimen: Secondary | ICD-10-CM | POA: Diagnosis not present

## 2017-11-27 DIAGNOSIS — Z9049 Acquired absence of other specified parts of digestive tract: Secondary | ICD-10-CM | POA: Insufficient documentation

## 2017-11-27 DIAGNOSIS — I252 Old myocardial infarction: Secondary | ICD-10-CM | POA: Insufficient documentation

## 2017-11-27 DIAGNOSIS — G8929 Other chronic pain: Secondary | ICD-10-CM | POA: Diagnosis not present

## 2017-11-27 MED ORDER — GABAPENTIN 300 MG PO CAPS: 300.0000 mg | ORAL_CAPSULE | Freq: Every day | ORAL | 1 refills | Status: DC

## 2017-11-27 NOTE — Progress Notes (Signed)
Subjective:    Patient ID: Lindsey [name], female    DOB: [birth year], 47 y.o.   MRN: [id number] 47 year old right handed female with history of apical mural thrombus with CVA 2016 and medication non-compliance who was admitted on 07/08/17 with sudden onset of left facial weakness and LUE weakness. CTA brain revealed occluded superior of mid 1/3 division for R-MCA with spontaneous recanalization therefore no endovascular treatment recommended. MRI brain revealed right lateral frontal and insula early/subacute infarct without hemorrhage. 2D echo showed fixed, medium apical thrombus with mild LVH and EF 55-60%. Dr. Pearlean Brownie felt that stroke was embolic due to non-compliance with Xarelto related  HPI   . States the shoulder injection did not help and it actually hurt a little more  MRI of the left shoulder was ordered however Medicaid has not approved this.  We discussed that it is not approved and alternative may be musculoskeletal ultrasound of the left shoulder.  Pain Inventory Average Pain 6 Pain Right Now 6 My pain is aching  In the last 24 hours, has pain interfered with the following? General activity 4 Relation with others 4 Enjoyment of life 4 What TIME of day is your pain at its worst? daytime and evening Sleep (in general) Poor  Pain is worse with: some activites Pain improves with: na Relief from Meds: 0  Mobility walk without assistance how many minutes can you walk? 15 ability to climb steps?  yes do you drive?  no  Function not employed: date last employed .  Neuro/Psych No problems in this area  Prior Studies Any changes since last visit?  no  Physicians involved in your care Any changes since last visit?  no   Family History  Problem Relation Age of Onset  . Stroke Father   . Heart disease Brother        arrhythmia; died  . Breast cancer Maternal Aunt    Social History   Socioeconomic History  . Marital status: Divorced    Spouse name: Not on file    . Number of children: 2  . Years of education: Not on file  . Highest education level: Not on file  Occupational History  . Occupation: Geneticist, molecular for [employer]  Social Needs  . Financial resource strain: Not on file  . Food insecurity:    Worry: Not on file    Inability: Not on file  . Transportation needs:    Medical: Not on file    Non-medical: Not on file  Tobacco Use  . Smoking status: Former Smoker    Packs/day: 0.20    Years: 1.00    Pack years: 0.20    Types: Cigarettes  . Smokeless tobacco: Never Used  . Tobacco comment: 2018  " i STILL SMOKE 3  CIGARETTES A DAY "  Substance and Sexual Activity  . Alcohol use: Yes    Alcohol/week: 0.0 standard drinks    Comment: Social drinker  . Drug use: Not Currently    Types: Cocaine    Comment: hx of use x 1 in the past  . Sexual activity: Never    Birth control/protection: Surgical  Lifestyle  . Physical activity:    Days per week: Not on file    Minutes per session: Not on file  . Stress: Not on file  Relationships  . Social connections:    Talks on phone: Not on file    Gets together: Not on file    Attends religious service:  Not on file    Active member of club or organization: Not on file    Attends meetings of clubs or organizations: Not on file    Relationship status: Not on file  Other Topics Concern  . Not on file  Social History Narrative  . Not on file   Past Surgical History:  Procedure Laterality Date  . CARDIAC CATHETERIZATION    . CARDIAC CATHETERIZATION N/A 01/29/2015   Procedure: Left Heart Cath and Coronary Angiography;  Surgeon: Peter M Swaziland, MD;  Location: [facility] INVASIVE CV LAB;  Service: Cardiovascular;  Laterality: N/A;  . CHOLECYSTECTOMY N/A 08/14/2016   Procedure: LAPAROSCOPIC CHOLECYSTECTOMY;  Surgeon: Gaynelle Adu, MD;  Location: [facility] OR;  Service: General;  Laterality: N/A;  . COLONOSCOPY N/A 03/29/2014   Procedure: COLONOSCOPY;  Surgeon: Louis Meckel, MD;  Location: [facility]  ENDOSCOPY;  Service: Endoscopy;  Laterality: N/A;  . CORONARY ANGIOPLASTY    . ESOPHAGOGASTRODUODENOSCOPY N/A 06/09/2012   Procedure: ESOPHAGOGASTRODUODENOSCOPY (EGD);  Surgeon: Theda Belfast, MD;  Location: [facility] ENDOSCOPY;  Service: Endoscopy;  Laterality: N/A;  . ESOPHAGOGASTRODUODENOSCOPY N/A 08/02/2013   Procedure: ESOPHAGOGASTRODUODENOSCOPY (EGD);  Surgeon: Meryl Dare, MD;  Location: [facility] ENDOSCOPY;  Service: Endoscopy;  Laterality: N/A;  . IR ANGIO INTRA EXTRACRAN SEL COM CAROTID INNOMINATE UNI R MOD SED  07/08/2017  . LEFT HEART CATHETERIZATION WITH CORONARY ANGIOGRAM N/A 12/24/2011   Procedure: LEFT HEART CATHETERIZATION WITH CORONARY ANGIOGRAM;  Surgeon: Kathleene Hazel, MD;  Location: [facility] CATH LAB;  Service: Cardiovascular;  Laterality: N/A;  . RADIOLOGY WITH ANESTHESIA N/A 07/08/2017   Procedure: IR WITH ANESTHESIA;  Surgeon: Radiologist, Medication, MD;  Location: [facility] OR;  Service: Radiology;  Laterality: N/A;  . TUBAL LIGATION     Past Medical History:  Diagnosis Date  . Anemia   . Antithrombin III deficiency (HCC)    ?pt not sure if true diagnosis  . Anxiety   . Apical mural thrombus   . Blood transfusion   . Cholecystitis 07/2016  . Coronary artery disease    a. apical LAD infarction '00. b. NSTEMI s/p BMS to prox LAD '09. c. Cath 01/2015: stable LAD stent, otherwise minimal nonobstructive CAD.  Marland Kitchen GERD (gastroesophageal reflux disease)   . Hyperlipidemia   . Hypertension   . Myocardial infarction (HCC)   . Noncompliance with medication regimen    a. h/o noncompliance with med regimen (previous running out of Coumadin).  . 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 Stroke (HCC)    assoc with short term memory loss and right peripheral vision loss; age 21   . TIA (transient ischemic attack) 2010  . Tobacco abuse    BP 118/84   Pulse 78   Ht 5\' 6"  (1.676 m)   Wt 180 lb 9.6 oz (81.9 kg)   SpO2 92%   BMI 29.15 kg/m   Opioid Risk Score:   Fall Risk Score:   `1  Depression screen PHQ 2/9  Depression screen [facility] 2/9 11/27/2017 09/23/2017 08/31/2017 08/03/2017 03/06/2015 02/20/2015 12/26/2014  Decreased Interest 3 1 0 1 0 0 0  Down, Depressed, Hopeless 3 1 0 1 0 0 0  PHQ - 2 Score 6 2 0 2 0 0 0  Altered sleeping - 1 - 1 - - -  Tired, decreased energy - 1 - 1 - - -  Change in appetite - 1 - 0 - - -  Feeling bad or failure about yourself  - 1 - 0 - - -  Trouble concentrating -  1 - 0 - - -  Moving slowly or fidgety/restless - - - 0 - - -  Suicidal thoughts - 0 - 0 - - -  PHQ-9 Score - 7 - 4 - - -  Difficult doing work/chores - - - Not difficult at all - - -  Some recent data might be hidden   Review of Systems  Constitutional: Negative.   HENT: Negative.   Eyes: Negative.   Respiratory: Negative.   Cardiovascular: Negative.   Gastrointestinal: Negative.   Endocrine: Negative.   Genitourinary: Negative.   Musculoskeletal:       Shoulder pain  Skin: Negative.   Allergic/Immunologic: Negative.   Neurological: Negative.   Hematological: Bruises/bleeds easily.  Psychiatric/Behavioral: Positive for dysphoric mood.  All other systems reviewed and are negative.      Objective:   Physical Exam  Constitutional: She appears well-developed and well-nourished.  HENT:  Head: Normocephalic and atraumatic.  Neck: Normal range of motion.  Neurological:  Motor strength is 5/5 in the right deltoid bicep tricep grip hip flexor knee extensor ankle dorsiflexor 3- in the left deltoid limited by pain 4+ in the left bicep tricep grip hip flexor knee extensor ankle dorsiflexion   Psychiatric: She has a normal mood and affect.  Nursing note and vitals reviewed.         Assessment & Plan:   1.  Left hemiplegic shoulder pain , likely multifactorial Suspect RTC , neurogenic component Trial gabapentin 300mg  qhs   Shoulder injection Left subacromial Without  ultrasound guidance  Indication:LEFT Shoulder pain not relieved by medication management  and other conservative care.  Informed consent was obtained after describing risks and benefits of the procedure with the patient, this includes bleeding, bruising, infection and medication side effects. The patient wishes to proceed and has given written consent. Patient was placed in a seated position. Theleft shoulder was marked and prepped with betadine in the subacromial area. A 25-gauge 1-1/2 inch needle was inserted into the subacromial area. After negative draw back for blood, a solution containing 1 mL of 6 mg per ML betamethasone and 4 mL of 1% lidocaine was injected. A band aid was applied. The patient tolerated the procedure well. Post procedure instructions were given.

## 2017-11-30 ENCOUNTER — Ambulatory Visit
Admission: RE | Admit: 2017-11-30 | Discharge: 2017-11-30 | Disposition: A | Discharge status: Home / Self Care | Payer: Medicaid Other | Source: Ambulatory Visit | Attending: Physical Medicine & Rehabilitation | Admitting: Physical Medicine & Rehabilitation

## 2017-11-30 DIAGNOSIS — M25512 Pain in left shoulder: Principal | ICD-10-CM

## 2017-11-30 DIAGNOSIS — G8929 Other chronic pain: Secondary | ICD-10-CM

## 2017-12-24 ENCOUNTER — Encounter: Payer: Self-pay | Admitting: Adult Health

## 2017-12-24 ENCOUNTER — Ambulatory Visit: Payer: Medicaid Other | Admitting: Adult Health

## 2017-12-24 NOTE — Progress Notes (Deleted)
[facility] [address] [place]. [place] [address] [phone]       OFFICE FOLLOW UP NOTE  [name] Date of Birth:  08-06-1970 Medical Record Number:  [id number]   Reason for Referral:  hospital stroke follow up  CHIEF COMPLAINT:  No chief complaint on file.   HPI: SHLANDA [name] is being seen today in the office for right MCA infarct on 07/08/17. History obtained from patient and chart review. Reviewed all radiology images and labs personally.  Lindsey Patricia Whiteis an 47 y.o.femalewith ? AT III deficiency, apical mural thrombus, prior history of stroke, HTN, HLD, MI who presented with sudden onset left facial droop and left arm weakness at 6.30 pm on 07/08/17. Patient last normal 07/08/2017 at 6.30 pm having dinner until daughter noticed left facial droop. EMS was called and patient stated she had right side weakness however on exam noted to have left side weakness and left facial droop. BP was 147/91 mm HG recorded by EMS. CBG was 145.tPA was not given as pt on Xarelto with last dose yesterday. CTA showed a right M2 occlusion with poor distal flow.  Cerebral angiogram showed recanalization/partial recanalization of M2.  There was no endovascular procedure performed.  She was admitted for further stroke evaluation and treatment.   It was later found the patient had missed doses of Xarelto and this was likely the etiology of her stroke.  CT head showed old left PCA infarct but no acute abnormality.  MRI head showed right frontal lobe and insula infarct.  CTA head and neck showed right M2 occlusion and bilateral V4 stenosis.  2D echo showed some LVH with an EF of 55% and akinesis of apical myocardium.  LDL 85 patient continue Lipitor 40 mg.  Patient was on Xarelto and Plavix prior to admission but as she was noncompliant was recommended to continue Plavix and Xarelto.  Patient was taking Xarelto due to previous TTE's showing persistent LV thrombus and as she had  fluctuating INR levels on Coumadin it was recommended to switch to Xarelto.  Patient prescribed Plavix PTA as she has a history of CAD/MI/NSTEMI and status post stent in the past. therapy recommended CIR at discharge.  08/24/2017 visit: Patient is being seen today for hospital follow-up.  She continues to participate in PT/OT/ST and has residual mild left hemiparesis with left facial droop and mild dysarthria.  Does have complaints of continued dizziness but this has been improving.  She continues to take Xarelto but recently ran out of Plavix approximately 5 days ago but denies bleeding or bruising.  Continues to take Lipitor without side effects myalgias.  Blood pressure today 110/78.  She recently is working as a Conservation officer, nature in a store but has since applied for disability.  Patient had a fall last Thursday (08/20/2017) where she was carrying a close basket down a flight of stairs being unable to hold onto the railing and this resulted in a fall.  She did hit her head as she has a friction burn on the left side of her face above her eye.  She denies neurological changes such as weakness, numbness, headache, or vision changes since this fall.  Denies new or worsening stroke/TIA symptoms.  Interval history 12/24/2017: Patient is being seen today for follow-up visit.  She continues to be followed by Dr. Jodean [name] at physical medicine for continued left hemiplegic shoulder pain for which she is receiving shoulder injections along with trialing gabapentin 300 mg nightly.    ROS:  14 system review of systems performed and negative with exception of dizziness  PMH:  Past Medical History:  Diagnosis Date  . Anemia   . Antithrombin III deficiency (HCC)    ?pt not sure if true diagnosis  . Anxiety   . Apical mural thrombus   . Blood transfusion   . Cholecystitis 07/2016  . Coronary artery disease    a. apical LAD infarction '00. b. NSTEMI s/p BMS to prox LAD '09. c. Cath 01/2015: stable LAD stent, otherwise  minimal nonobstructive CAD.  Marland Kitchen GERD (gastroesophageal reflux disease)   . Hyperlipidemia   . Hypertension   . Myocardial infarction (HCC)   . Noncompliance with medication regimen    a. h/o noncompliance with med regimen (previous running out of Coumadin).  . 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 Stroke (HCC)    assoc with short term memory loss and right peripheral vision loss; age 47   . TIA (transient ischemic attack) 2010  . Tobacco abuse     PSH:  Past Surgical History:  Procedure Laterality Date  . CARDIAC CATHETERIZATION    . CARDIAC CATHETERIZATION N/A 01/29/2015   Procedure: Left Heart Cath and Coronary Angiography;  Surgeon: Peter M Swaziland, MD;  Location: [facility] INVASIVE CV LAB;  Service: Cardiovascular;  Laterality: N/A;  . CHOLECYSTECTOMY N/A 08/14/2016   Procedure: LAPAROSCOPIC CHOLECYSTECTOMY;  Surgeon: Gaynelle Adu, MD;  Location: [facility] OR;  Service: General;  Laterality: N/A;  . COLONOSCOPY N/A 03/29/2014   Procedure: COLONOSCOPY;  Surgeon: Louis Meckel, MD;  Location: [facility] ENDOSCOPY;  Service: Endoscopy;  Laterality: N/A;  . CORONARY ANGIOPLASTY    . ESOPHAGOGASTRODUODENOSCOPY N/A 06/09/2012   Procedure: ESOPHAGOGASTRODUODENOSCOPY (EGD);  Surgeon: Theda Belfast, MD;  Location: [facility] ENDOSCOPY;  Service: Endoscopy;  Laterality: N/A;  . ESOPHAGOGASTRODUODENOSCOPY N/A 08/02/2013   Procedure: ESOPHAGOGASTRODUODENOSCOPY (EGD);  Surgeon: Meryl Dare, MD;  Location: [facility] ENDOSCOPY;  Service: Endoscopy;  Laterality: N/A;  . IR ANGIO INTRA EXTRACRAN SEL COM CAROTID INNOMINATE UNI R MOD SED  07/08/2017  . LEFT HEART CATHETERIZATION WITH CORONARY ANGIOGRAM N/A 12/24/2011   Procedure: LEFT HEART CATHETERIZATION WITH CORONARY ANGIOGRAM;  Surgeon: Kathleene Hazel, MD;  Location: [facility] CATH LAB;  Service: Cardiovascular;  Laterality: N/A;  . RADIOLOGY WITH ANESTHESIA N/A 07/08/2017   Procedure: IR WITH ANESTHESIA;  Surgeon: Radiologist, Medication, MD;  Location: [facility] OR;  Service: Radiology;   Laterality: N/A;  . TUBAL LIGATION      Social History:  Social History   Socioeconomic History  . Marital status: Divorced    Spouse name: Not on file  . Number of children: 2  . Years of education: Not on file  . Highest education level: Not on file  Occupational History  . Occupation: Geneticist, molecular for [employer]  Social Needs  . Financial resource strain: Not on file  . Food insecurity:    Worry: Not on file    Inability: Not on file  . Transportation needs:    Medical: Not on file    Non-medical: Not on file  Tobacco Use  . Smoking status: Former Smoker    Packs/day: 0.20    Years: 1.00    Pack years: 0.20    Types: Cigarettes  . Smokeless tobacco: Never Used  . Tobacco comment: 2018  " i STILL SMOKE 3  CIGARETTES A DAY "  Substance and Sexual Activity  . Alcohol use: Yes    Alcohol/week: 0.0 standard drinks    Comment: Social drinker  . Drug  use: Not Currently    Types: Cocaine    Comment: hx of use x 1 in the past  . Sexual activity: Never    Birth control/protection: Surgical  Lifestyle  . Physical activity:    Days per week: Not on file    Minutes per session: Not on file  . Stress: Not on file  Relationships  . Social connections:    Talks on phone: Not on file    Gets together: Not on file    Attends religious service: Not on file    Active member of club or organization: Not on file    Attends meetings of clubs or organizations: Not on file    Relationship status: Not on file  . Intimate partner violence:    Fear of current or ex partner: Not on file    Emotionally abused: Not on file    Physically abused: Not on file    Forced sexual activity: Not on file  Other Topics Concern  . Not on file  Social History Narrative  . Not on file    Family History:  Family History  Problem Relation Age of Onset  . Stroke Father   . Heart disease Brother        arrhythmia; died  . Breast cancer Maternal Aunt     Medications:   Current  Outpatient Medications on File Prior to Visit  Medication Sig Dispense Refill  . acetaminophen (TYLENOL) 325 MG tablet Take 1-2 tablets (325-650 mg total) by mouth every 4 (four) hours as needed for mild pain.    Marland Kitchen amLODipine (NORVASC) 10 MG tablet Take 10 mg by mouth daily.  3  . atorvastatin (LIPITOR) 40 MG tablet Take 1 tablet (40 mg total) by mouth daily. 30 tablet 0  . carvedilol (COREG) 25 MG tablet TAKE 1 TABLET BY MOUTH TWICE DAILY WITH MEALS 60 tablet 0  . clopidogrel (PLAVIX) 75 MG tablet Take 1 tablet (75 mg total) by mouth daily. 90 tablet 4  . gabapentin (NEURONTIN) 300 MG capsule Take 1 capsule (300 mg total) by mouth at bedtime. 30 capsule 1  . hydrochlorothiazide (HYDRODIURIL) 25 MG tablet Take 12.5 mg by mouth daily.  3  . nitroGLYCERIN (NITROSTAT) 0.4 MG SL tablet Place 1 tablet (0.4 mg total) under the tongue every 5 (five) minutes as needed for chest pain (up to 3 doses). 15 tablet 0  . pantoprazole (PROTONIX) 40 MG tablet Take 40 mg by mouth daily.  3  . vitamin B-12 (CYANOCOBALAMIN) 1000 MCG tablet Take 1 tablet (1,000 mcg total) by mouth daily. 30 tablet 0  . XARELTO 20 MG TABS tablet TAKE 1 TABLET BY MOUTH ONCE DAILY WITH  SUPPER 30 tablet 0   No current facility-administered medications on file prior to visit.     Allergies:  No Known Allergies   Physical Exam  There were no vitals filed for this visit. There is no height or weight on file to calculate BMI. No exam data present  General: well developed, pleasant middle-aged African-American female, well nourished, seated, in no evident distress Head: head normocephalic and atraumatic.   Neck: supple with no carotid or supraclavicular bruits Cardiovascular: regular rate and rhythm, no murmurs Musculoskeletal: no deformity Skin: Friction burn left side of face due to recent fall Vascular:  Normal pulses all extremities  Neurologic Exam Mental Status: Awake and fully alert.  Mild dysarthria oriented to place  and time. Recent and remote memory intact. Attention span, concentration and fund of  knowledge appropriate. Mood and affect appropriate.  Cranial Nerves: Fundoscopic exam reveals sharp disc margins. Pupils equal, briskly reactive to light. Extraocular movements full without nystagmus. Visual fields full to confrontation. Hearing intact. Facial sensation intact.  Mild left lower facial droop Motor: Normal bulk and tone. Normal strength in all tested extremity muscles.  Very mild left upper extremity weakness Sensory.: intact to touch , pinprick , position and vibratory sensation.  Coordination: Rapid alternating movements normal in all extremities. Finger-to-nose and heel-to-shin performed accurately bilaterally.  Decreased left finger dexterity Gait and Station: Arises from chair without difficulty. Stance is normal. Gait demonstrates normal stride length and balance . Able to heel, toe and tandem walk without difficulty.  Reflexes: 1+ and symmetric. Toes downgoing.    NIHSS  1 Modified Rankin  1    Diagnostic Data (Labs, Imaging, Testing)  CT HEAD WO CONTRAST 07/08/2017 IMPRESSION: 1. No acute intracranial infarct or other process identified. 2. ASPECTS is 10. 3. Remote left PCA territory infarct.  CT ANGIO HEAD W OR WO CONTRAST CT ANGIO NECK W OR WO CONTRAST 07/08/2017 IMPRESSION: 1. Patent carotid and vertebral arteries. No dissection, aneurysm, or hemodynamically significant stenosis utilizing NASCET criteria. 2. Right M2 superior division proximal occlusion. 3. Otherwise patent anterior and posterior intracranial circulation. No additional large vessel occlusion, aneurysm, or significant Stenosis.  IR ANGIO CEREBRAL 07/08/2017 IMPRESSION: Occluded superior division of the right middle cerebral artery in its mid 1/3. Extensive collaterals from the right pericallosal and the lateral lenticulostriate branches supplying the perisylvian triangle. The findings were reviewed with  the referring neurologist. Given the patient demonstrated significant clinical improvement in her speech, motor function and her facial droop, and also the fact the patient has been compliant on her Xarelto, and the potential risk of worsening neurological function with endovascular treatment, it was elected not to pursue with endovascular intra-arterial treatment at this time.  CT HEAD WO CONTRAST (REPEAT) 07/08/2017 IMPRESSION: Developing loss of gray-Seybold differentiation in the right anterolateral frontal lobe, frontal operculum, and insula. ASPECTS is 7 at this time. No mass effect or hemorrhage.  MR BRAIN WO CONTRAST 07/09/17 IMPRESSION: 1. Right lateral frontal lobe and insula acute/early subacute infarction, approximately 32 cc, similar in distribution to prior CT of head given differences in technique. No hemorrhage or mass effect. 2. Otherwise unremarkable MRI of the brain.    ASSESSMENT: Lindsey [name] is a 47 y.o. year old female here with right MCA on 07/08/2017 secondary to right M2 occlusion likely due to Xarelto related coagulopathy versus LV thrombus with noncompliance of anticoagulation. Vascular risk factors include HTN and HLD.     PLAN: -Continue clopidogrel 75 mg daily and Xarelto (rivaroxaban) daily  and Lipitor for secondary stroke prevention -f/u with cardiologist regarding Xarelto and Plavix management -F/u with PCP regarding your HLD and HTN management -continue to monitor BP at home -Continue PT/OT/ST -Advised patient that due to being on Xarelto and Plavix, she is at increased risk for brain hemorrhage and that if she is to follow again, it is advised that she be seen in ED to rule out brain hemorrhage.  As patient is neurologically stable without new or worsening deficits, imaging is not needed at this time for previous fall -Advised patient importance of compliance of both Xarelto and Plavix -as patient recently ran out of Plavix, refill provided but  advised patient that she should schedule follow-up appointment with cardiologist to continue management of these medications -Maintain strict control of hypertension with blood pressure goal below  130/90, diabetes with hemoglobin A1c goal below 6.5% and cholesterol with LDL cholesterol (bad cholesterol) goal below 70 mg/dL. I also advised the patient to eat a healthy diet with plenty of whole grains, cereals, fruits and vegetables, exercise regularly and maintain ideal body weight.  Follow up in 4 months or call earlier if needed   Greater than 50% of time during this 25 minute visit was spent on counseling,explanation of diagnosis of right MCA, reviewing risk factor management of HLD and HTN, medication compliance, planning of further management, discussion with patient and family and coordination of care    George Hugh, [facility]  [facility] [address] [name], [place] [address]  Phone [phone] Fax [phone]

## 2017-12-25 ENCOUNTER — Encounter: Payer: Self-pay | Admitting: Physical Medicine & Rehabilitation

## 2017-12-25 ENCOUNTER — Encounter: Payer: Medicare Other | Attending: Physical Medicine & Rehabilitation

## 2017-12-25 ENCOUNTER — Ambulatory Visit: Payer: Medicaid Other | Admitting: Physical Medicine & Rehabilitation

## 2017-12-25 VITALS — BP 128/91 | HR 86 | Ht 66.0 in | Wt 182.0 lb

## 2017-12-25 DIAGNOSIS — I63511 Cerebral infarction due to unspecified occlusion or stenosis of right middle cerebral artery: Secondary | ICD-10-CM | POA: Insufficient documentation

## 2017-12-25 DIAGNOSIS — G8929 Other chronic pain: Secondary | ICD-10-CM | POA: Diagnosis not present

## 2017-12-25 DIAGNOSIS — Z8249 Family history of ischemic heart disease and other diseases of the circulatory system: Secondary | ICD-10-CM | POA: Insufficient documentation

## 2017-12-25 DIAGNOSIS — Z9114 Patient's other noncompliance with medication regimen: Secondary | ICD-10-CM | POA: Insufficient documentation

## 2017-12-25 DIAGNOSIS — F17210 Nicotine dependence, cigarettes, uncomplicated: Secondary | ICD-10-CM | POA: Diagnosis not present

## 2017-12-25 DIAGNOSIS — M25512 Pain in left shoulder: Secondary | ICD-10-CM

## 2017-12-25 DIAGNOSIS — I252 Old myocardial infarction: Secondary | ICD-10-CM | POA: Diagnosis not present

## 2017-12-25 DIAGNOSIS — I2510 Atherosclerotic heart disease of native coronary artery without angina pectoris: Secondary | ICD-10-CM | POA: Diagnosis not present

## 2017-12-25 DIAGNOSIS — I69354 Hemiplegia and hemiparesis following cerebral infarction affecting left non-dominant side: Secondary | ICD-10-CM | POA: Insufficient documentation

## 2017-12-25 DIAGNOSIS — G441 Vascular headache, not elsewhere classified: Secondary | ICD-10-CM | POA: Insufficient documentation

## 2017-12-25 DIAGNOSIS — Z803 Family history of malignant neoplasm of breast: Secondary | ICD-10-CM | POA: Insufficient documentation

## 2017-12-25 DIAGNOSIS — Z9889 Other specified postprocedural states: Secondary | ICD-10-CM | POA: Insufficient documentation

## 2017-12-25 DIAGNOSIS — I10 Essential (primary) hypertension: Secondary | ICD-10-CM | POA: Diagnosis not present

## 2017-12-25 DIAGNOSIS — E785 Hyperlipidemia, unspecified: Secondary | ICD-10-CM | POA: Diagnosis not present

## 2017-12-25 DIAGNOSIS — K219 Gastro-esophageal reflux disease without esophagitis: Secondary | ICD-10-CM | POA: Diagnosis not present

## 2017-12-25 DIAGNOSIS — Z9851 Tubal ligation status: Secondary | ICD-10-CM | POA: Insufficient documentation

## 2017-12-25 DIAGNOSIS — Z9049 Acquired absence of other specified parts of digestive tract: Secondary | ICD-10-CM | POA: Insufficient documentation

## 2017-12-25 DIAGNOSIS — F419 Anxiety disorder, unspecified: Secondary | ICD-10-CM | POA: Diagnosis not present

## 2017-12-25 NOTE — Progress Notes (Signed)
Left Glenohumeral under Ultrasound guidance  Indication: Shoulder pain not relieved by medication management and other conservative care.  Informed consent was obtained after describing risks and benefits of the procedure with the patient, this includes bleeding, bruising, infection and medication side effects. The patient wishes to proceed and has given written consent. Patient was placed in a seated position. TheLeft shoulder was marked and prepped with betadine in the subacromial area. A 25-gauge 1-1/2 inch needle was inserted into the subacromial area. After negative draw back for blood, a solution containing 1 mL of 6 mg per ML betamethasone and 4 mL of 1% lidocaine was injected. A band aid was applied. The patient tolerated the procedure well. Post procedure instructions were given.

## 2017-12-25 NOTE — Patient Instructions (Signed)
Adhesive Capsulitis Adhesive capsulitis is inflammation of the tendons and ligaments that surround the shoulder joint (shoulder capsule). This condition causes the shoulder to become stiff and painful to move. Adhesive capsulitis is also called frozen shoulder. What are the causes? This condition may be caused by:  An injury to the shoulder joint.  Straining the shoulder.  Not moving the shoulder for a period of time. This can happen if your arm was injured or in a sling.  Long-standing health problems, such as: ? Diabetes. ? Thyroid problems. ? Heart disease. ? Stroke. ? Rheumatoid arthritis. ? Lung disease.  In some cases, the cause may not be known. What increases the risk? This condition is more likely to develop in:  Women.  People who are older than 49 years of age.  What are the signs or symptoms? Symptoms of this condition include:  Pain in the shoulder when moving the arm. There may also be pain when parts of the shoulder are touched. The pain is worse at night or when at rest.  Soreness or aching in the shoulder.  Inability to move the shoulder normally.  Muscle spasms.  How is this diagnosed? This condition is diagnosed with a physical exam and imaging tests, such as an X-ray or MRI. How is this treated? This condition may be treated with:  Treatment of the underlying cause or condition.  Physical therapy. This involves performing exercises to get the shoulder moving again.  Medicine. Medicine may be given to relieve pain, inflammation, or muscle spasms.  Steroid injections into the shoulder joint.  Shoulder manipulation. This is a procedure to move the shoulder into another position. It is done after you are given a medicine to make you fall asleep (general anesthetic). The joint may also be injected with salt water at high pressure to break down scarring.  Surgery. This may be done in severe cases when other treatments have failed.  Although most  people recover completely from adhesive capsulitis, some may not regain the full movement of the shoulder. Follow these instructions at home:  Take over-the-counter and prescription medicines only as told by your health care provider.  If you are being treated with physical therapy, follow instructions from your physical therapist.  Avoid exercises that put a lot of demand on your shoulder, such as throwing. These exercises can make pain worse.  If directed, apply ice to the injured area: ? Put ice in a plastic bag. ? Place a towel between your skin and the bag. ? Leave the ice on for 20 minutes, 2-3 times per day. Contact a health care provider if:  You develop new symptoms.  Your symptoms get worse. This information is not intended to replace advice given to you by your health care provider. Make sure you discuss any questions you have with your health care provider. Document Released: 12/29/2008 Document Revised: 08/09/2015 Document Reviewed: 06/26/2014 Elsevier Interactive Patient Education  2018 [employer]  

## 2017-12-28 ENCOUNTER — Telehealth: Payer: Self-pay

## 2017-12-28 NOTE — Telephone Encounter (Signed)
Patient no show for appt on 12/24/2017. 

## 2018-02-03 ENCOUNTER — Other Ambulatory Visit: Payer: Self-pay | Admitting: Physical Medicine & Rehabilitation

## 2018-02-05 ENCOUNTER — Ambulatory Visit (HOSPITAL_BASED_OUTPATIENT_CLINIC_OR_DEPARTMENT_OTHER): Payer: Medicare Other | Admitting: Physical Medicine & Rehabilitation

## 2018-02-05 ENCOUNTER — Encounter: Payer: Self-pay | Admitting: Physical Medicine & Rehabilitation

## 2018-02-05 ENCOUNTER — Encounter: Payer: Medicare Other | Attending: Physical Medicine & Rehabilitation

## 2018-02-05 VITALS — BP 127/87 | HR 96 | Ht 66.0 in | Wt 183.0 lb

## 2018-02-05 DIAGNOSIS — M7542 Impingement syndrome of left shoulder: Secondary | ICD-10-CM | POA: Diagnosis not present

## 2018-02-05 DIAGNOSIS — E785 Hyperlipidemia, unspecified: Secondary | ICD-10-CM | POA: Insufficient documentation

## 2018-02-05 DIAGNOSIS — G441 Vascular headache, not elsewhere classified: Secondary | ICD-10-CM | POA: Diagnosis not present

## 2018-02-05 DIAGNOSIS — Z8249 Family history of ischemic heart disease and other diseases of the circulatory system: Secondary | ICD-10-CM | POA: Insufficient documentation

## 2018-02-05 DIAGNOSIS — Z9049 Acquired absence of other specified parts of digestive tract: Secondary | ICD-10-CM | POA: Diagnosis not present

## 2018-02-05 DIAGNOSIS — Z9114 Patient's other noncompliance with medication regimen: Secondary | ICD-10-CM | POA: Insufficient documentation

## 2018-02-05 DIAGNOSIS — Z9851 Tubal ligation status: Secondary | ICD-10-CM | POA: Insufficient documentation

## 2018-02-05 DIAGNOSIS — I69354 Hemiplegia and hemiparesis following cerebral infarction affecting left non-dominant side: Secondary | ICD-10-CM | POA: Insufficient documentation

## 2018-02-05 DIAGNOSIS — I10 Essential (primary) hypertension: Secondary | ICD-10-CM | POA: Insufficient documentation

## 2018-02-05 DIAGNOSIS — I63511 Cerebral infarction due to unspecified occlusion or stenosis of right middle cerebral artery: Secondary | ICD-10-CM | POA: Diagnosis not present

## 2018-02-05 DIAGNOSIS — Z9889 Other specified postprocedural states: Secondary | ICD-10-CM | POA: Diagnosis not present

## 2018-02-05 DIAGNOSIS — K219 Gastro-esophageal reflux disease without esophagitis: Secondary | ICD-10-CM | POA: Insufficient documentation

## 2018-02-05 DIAGNOSIS — M25512 Pain in left shoulder: Secondary | ICD-10-CM

## 2018-02-05 DIAGNOSIS — F17210 Nicotine dependence, cigarettes, uncomplicated: Secondary | ICD-10-CM | POA: Insufficient documentation

## 2018-02-05 DIAGNOSIS — F419 Anxiety disorder, unspecified: Secondary | ICD-10-CM | POA: Diagnosis not present

## 2018-02-05 DIAGNOSIS — G8929 Other chronic pain: Secondary | ICD-10-CM | POA: Diagnosis not present

## 2018-02-05 DIAGNOSIS — I252 Old myocardial infarction: Secondary | ICD-10-CM | POA: Diagnosis not present

## 2018-02-05 DIAGNOSIS — I2510 Atherosclerotic heart disease of native coronary artery without angina pectoris: Secondary | ICD-10-CM | POA: Diagnosis not present

## 2018-02-05 DIAGNOSIS — Z803 Family history of malignant neoplasm of breast: Secondary | ICD-10-CM | POA: Diagnosis not present

## 2018-02-05 NOTE — Progress Notes (Signed)
Subjective:    Patient ID: Lindsey [name], female    DOB: Jun 25, 1970, 47 y.o.   MRN: [id number] 46 year old right handed female with history of apical mural thrombus with CVA 2016 and medication non-compliance who was admitted on 07/08/17 with sudden onset of left facial weakness and LUE weakness. CTA brain revealed occluded superior of mid 1/3 division for R-MCA with spontaneous recanalization therefore no endovascular treatment recommended. MRI brain revealed right lateral frontal and insula early/subacute infarct without hemorrhage. 2D echo showed fixed, medium apical thrombus with mild LVH and EF 55-60%. Dr. Pearlean Brownie felt that stroke was embolic due to non-compliance with Xarelto related  HPI Patient underwent left glenohumeral injection under ultrasound-guided on 12/25/2017 Patient notes a marked improvement since receiving the injection.  She no longer has severe shoulder pain.  She has occasional pain with reaching and closing her car door Now able to reach overhead with minimal discomfort pain Inventory Average Pain 4 Pain Right Now 4 My pain is dull  In the last 24 hours, has pain interfered with the following? General activity 0 Relation with others 0 Enjoyment of life 0 What TIME of day is your pain at its worst? night Sleep (in general) Fair  Pain is worse with: na Pain improves with: medication Relief from Meds: na  Mobility walk without assistance  Function not employed: date last employed .  Neuro/Psych No problems in this area  Prior Studies Any changes since last visit?  no MRI OF THE LEFT SHOULDER WITHOUT CONTRAST  TECHNIQUE: Multiplanar, multisequence MR imaging of the shoulder was performed. No intravenous contrast was administered.  COMPARISON:  None.  FINDINGS: Rotator cuff: Moderate tendinosis of the supraspinatus tendon with a small partial-thickness articular surface tear. Infraspinatus tendon is intact. Teres minor tendon is intact.  Subscapularis tendon is intact.  Muscles: No atrophy or abnormal signal of the muscles of the rotator cuff.  Biceps long head:  Intact.  Acromioclavicular Joint: Mild arthropathy of the acromioclavicular joint. Incidental note made of an os acromiale. Type II acromion. No significant subacromial/subdeltoid bursal fluid.  Glenohumeral Joint: No joint effusion. No chondral defect. Severe thickening of the inferior glenohumeral ligament with adjacent soft tissue edema concerning for injury of the inferior glenohumeral ligament with possible small partial tear at the humeral attachment.  Labrum: Tear of superior labrum adjacent to the bicipital labral complex.  Bones:  No marrow abnormality, fracture or dislocation.  Other: No fluid collection or hematoma.  IMPRESSION: 1. Moderate tendinosis of the supraspinatus tendon with a small partial-thickness articular surface tear. Infraspinatus tendon is intact. 2. Severe thickening of the inferior glenohumeral ligament with adjacent soft tissue edema concerning for injury of the inferior glenohumeral ligament with possible small partial tear at the humeral attachment.   Electronically Signed   By: Elige Ko   On: 12/01/2017 08:38 Physicians involved in your care Any changes since last visit?  no   Family History  Problem Relation Age of Onset  . Stroke Father   . Heart disease Brother        arrhythmia; died  . Breast cancer Maternal Aunt    Social History   Socioeconomic History  . Marital status: Divorced    Spouse name: Not on file  . Number of children: 2  . Years of education: Not on file  . Highest education level: Not on file  Occupational History  . Occupation: Geneticist, molecular for [employer]  Social Needs  . Financial resource strain: Not on  file  . Food insecurity:    Worry: Not on file    Inability: Not on file  . Transportation needs:    Medical: Not on file    Non-medical: Not  on file  Tobacco Use  . Smoking status: Former Smoker    Packs/day: 0.20    Years: 1.00    Pack years: 0.20    Types: Cigarettes  . Smokeless tobacco: Never Used  . Tobacco comment: 2018  " i STILL SMOKE 3  CIGARETTES A DAY "  Substance and Sexual Activity  . Alcohol use: Yes    Alcohol/week: 0.0 standard drinks    Comment: Social drinker  . Drug use: Not Currently    Types: Cocaine    Comment: hx of use x 1 in the past  . Sexual activity: Never    Birth control/protection: Surgical  Lifestyle  . Physical activity:    Days per week: Not on file    Minutes per session: Not on file  . Stress: Not on file  Relationships  . Social connections:    Talks on phone: Not on file    Gets together: Not on file    Attends religious service: Not on file    Active member of club or organization: Not on file    Attends meetings of clubs or organizations: Not on file    Relationship status: Not on file  Other Topics Concern  . Not on file  Social History Narrative  . Not on file   Past Surgical History:  Procedure Laterality Date  . CARDIAC CATHETERIZATION    . CARDIAC CATHETERIZATION N/A 01/29/2015   Procedure: Left Heart Cath and Coronary Angiography;  Surgeon: Peter M Swaziland, MD;  Location: [facility] INVASIVE CV LAB;  Service: Cardiovascular;  Laterality: N/A;  . CHOLECYSTECTOMY N/A 08/14/2016   Procedure: LAPAROSCOPIC CHOLECYSTECTOMY;  Surgeon: Gaynelle Adu, MD;  Location: [facility] OR;  Service: General;  Laterality: N/A;  . COLONOSCOPY N/A 03/29/2014   Procedure: COLONOSCOPY;  Surgeon: Louis Meckel, MD;  Location: [facility] ENDOSCOPY;  Service: Endoscopy;  Laterality: N/A;  . CORONARY ANGIOPLASTY    . ESOPHAGOGASTRODUODENOSCOPY N/A 06/09/2012   Procedure: ESOPHAGOGASTRODUODENOSCOPY (EGD);  Surgeon: Theda Belfast, MD;  Location: [facility] ENDOSCOPY;  Service: Endoscopy;  Laterality: N/A;  . ESOPHAGOGASTRODUODENOSCOPY N/A 08/02/2013   Procedure: ESOPHAGOGASTRODUODENOSCOPY (EGD);  Surgeon: Meryl Dare,  MD;  Location: [facility] ENDOSCOPY;  Service: Endoscopy;  Laterality: N/A;  . IR ANGIO INTRA EXTRACRAN SEL COM CAROTID INNOMINATE UNI R MOD SED  07/08/2017  . LEFT HEART CATHETERIZATION WITH CORONARY ANGIOGRAM N/A 12/24/2011   Procedure: LEFT HEART CATHETERIZATION WITH CORONARY ANGIOGRAM;  Surgeon: Kathleene Hazel, MD;  Location: [facility] CATH LAB;  Service: Cardiovascular;  Laterality: N/A;  . RADIOLOGY WITH ANESTHESIA N/A 07/08/2017   Procedure: IR WITH ANESTHESIA;  Surgeon: Radiologist, Medication, MD;  Location: [facility] OR;  Service: Radiology;  Laterality: N/A;  . TUBAL LIGATION     Past Medical History:  Diagnosis Date  . Anemia   . Antithrombin III deficiency (HCC)    ?pt not sure if true diagnosis  . Anxiety   . Apical mural thrombus   . Blood transfusion   . Cholecystitis 07/2016  . Coronary artery disease    a. apical LAD infarction '00. b. NSTEMI s/p BMS to prox LAD '09. c. Cath 01/2015: stable LAD stent, otherwise minimal nonobstructive CAD.  Marland Kitchen GERD (gastroesophageal reflux disease)   . Hyperlipidemia   . Hypertension   . Myocardial infarction (HCC)   .  Noncompliance with medication regimen    a. h/o noncompliance with med regimen (previous running out of Coumadin).  . 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 Stroke (HCC)    assoc with short term memory loss and right peripheral vision loss; age 41   . TIA (transient ischemic attack) 2010  . Tobacco abuse    BP 127/87   Pulse 96   Ht 5\' 6"  (1.676 m)   Wt 183 lb (83 kg)   SpO2 97%   BMI 29.54 kg/m   Opioid Risk Score:   Fall Risk Score:  `1  Depression screen PHQ 2/9  Depression screen [facility] 2/9 11/27/2017 09/23/2017 08/31/2017 08/03/2017 03/06/2015 02/20/2015 12/26/2014  Decreased Interest 3 1 0 1 0 0 0  Down, Depressed, Hopeless 3 1 0 1 0 0 0  PHQ - 2 Score 6 2 0 2 0 0 0  Altered sleeping - 1 - 1 - - -  Tired, decreased energy - 1 - 1 - - -  Change in appetite - 1 - 0 - - -  Feeling bad or failure about yourself  - 1 - 0 - - -    Trouble concentrating - 1 - 0 - - -  Moving slowly or fidgety/restless - - - 0 - - -  Suicidal thoughts - 0 - 0 - - -  PHQ-9 Score - 7 - 4 - - -  Difficult doing work/chores - - - Not difficult at all - - -  Some recent data might be hidden     Review of Systems  Constitutional: Negative.   HENT: Negative.   Eyes: Negative.   Respiratory: Negative.   Cardiovascular: Negative.   Gastrointestinal: Negative.   Endocrine: Negative.   Genitourinary: Negative.   Musculoskeletal: Positive for arthralgias and myalgias.  Skin: Negative.   Allergic/Immunologic: Negative.   Neurological: Negative.   Hematological: Negative.   Psychiatric/Behavioral: Negative.   All other systems reviewed and are negative.      Objective:   Physical Exam  Constitutional: She is oriented to person, place, and time. She appears well-developed and well-nourished. No distress.  HENT:  Head: Normocephalic and atraumatic.  Eyes: Pupils are equal, round, and reactive to light. EOM are normal.  Musculoskeletal: Normal range of motion.  Neurological: She is alert and oriented to person, place, and time.  Skin: Skin is warm and dry. She is not diaphoretic.  Psychiatric: She has a normal mood and affect.  Nursing note and vitals reviewed.   Mildly positive impingement sign at 90 degrees on the left side only.  She has full range of motion external rotation limited with internal rotation on the left side.  No pain with external rotation on the left side. No evidence of muscle atrophy.  Her motor strength is 4+ left deltoid 5 at the right deltoid 4 at the left triceps 5 at the right triceps 5 bilateral biceps and bilateral grip Ambulates without assistive device no evidence of toe drag or knee instability      Assessment & Plan:  1.  Left shoulder pain post stroke.  She did have a fall around the time of her stroke.  She does have evidence of a supraspinatus partial tear.  MRI also demonstrated adhesive  capsulitis.  We discussed that while her adhesive capsulitis has resolved she still has underlying supraspinatus partial tear and this is likely causing her the impingement symptoms.  I have given her some exercises to work on.  We also discussed that  should her pain recur we can repeat the glenohumeral injection. Patient will call back on as-needed basis.

## 2018-02-05 NOTE — Patient Instructions (Signed)
Shoulder Exercises Ask your health care provider which exercises are safe for you. Do exercises exactly as told by your health care provider and adjust them as directed. It is normal to feel mild stretching, pulling, tightness, or discomfort as you do these exercises, but you should stop right away if you feel sudden pain or your pain gets worse.Do not begin these exercises until told by your health care provider. RANGE OF MOTION EXERCISES These exercises warm up your muscles and joints and improve the movement and flexibility of your shoulder. These exercises also help to relieve pain, numbness, and tingling. These exercises involve stretching your injured shoulder directly. Exercise A: Pendulum  1. Stand near a wall or a surface that you can hold onto for balance. 2. Bend at the waist and let your left / right arm hang straight down. Use your other arm to support you. Keep your back straight and do not lock your knees. 3. Relax your left / right arm and shoulder muscles, and move your hips and your trunk so your left / right arm swings freely. Your arm should swing because of the motion of your body, not because you are using your arm or shoulder muscles. 4. Keep moving your body so your arm swings in the following directions, as told by your health care provider: ? Side to side. ? Forward and backward. ? In clockwise and counterclockwise circles. 5. Continue each motion for __________ seconds, or for as long as told by your health care provider. 6. Slowly return to the starting position. Repeat __________ times. Complete this exercise __________ times a day. Exercise B:Flexion, Standing  1. Stand and hold a broomstick, a cane, or a similar object. Place your hands a little more than shoulder-width apart on the object. Your left / right hand should be palm-up, and your other hand should be palm-down. 2. Keep your elbow straight and keep your shoulder muscles relaxed. Push the stick down with  your healthy arm to raise your left / right arm in front of your body, and then over your head until you feel a stretch in your shoulder. ? Avoid shrugging your shoulder while you raise your arm. Keep your shoulder blade tucked down toward the middle of your back. 3. Hold for __________ seconds. 4. Slowly return to the starting position. Repeat __________ times. Complete this exercise __________ times a day. Exercise C: Abduction, Standing 1. Stand and hold a broomstick, a cane, or a similar object. Place your hands a little more than shoulder-width apart on the object. Your left / right hand should be palm-up, and your other hand should be palm-down. 2. While keeping your elbow straight and your shoulder muscles relaxed, push the stick across your body toward your left / right side. Raise your left / right arm to the side of your body and then over your head until you feel a stretch in your shoulder. ? Do not raise your arm above shoulder height, unless your health care provider tells you to do that. ? Avoid shrugging your shoulder while you raise your arm. Keep your shoulder blade tucked down toward the middle of your back. 3. Hold for __________ seconds. 4. Slowly return to the starting position. Repeat __________ times. Complete this exercise __________ times a day. Exercise D:Internal Rotation  1. Place your left / right hand behind your back, palm-up. 2. Use your other hand to dangle an exercise band, a towel, or a similar object over your shoulder. Grasp the band with   your left / right hand so you are holding onto both ends. 3. Gently pull up on the band until you feel a stretch in the front of your left / right shoulder. ? Avoid shrugging your shoulder while you raise your arm. Keep your shoulder blade tucked down toward the middle of your back. 4. Hold for __________ seconds. 5. Release the stretch by letting go of the band and lowering your hands. Repeat __________ times. Complete  this exercise __________ times a day. STRETCHING EXERCISES These exercises warm up your muscles and joints and improve the movement and flexibility of your shoulder. These exercises also help to relieve pain, numbness, and tingling. These exercises are done using your healthy shoulder to help stretch the muscles of your injured shoulder. Exercise E: Corner Stretch (External Rotation and Abduction)  1. Stand in a doorway with one of your feet slightly in front of the other. This is called a staggered stance. If you cannot reach your forearms to the door frame, stand facing a corner of a room. 2. Choose one of the following positions as told by your health care provider: ? Place your hands and forearms on the door frame above your head. ? Place your hands and forearms on the door frame at the height of your head. ? Place your hands on the door frame at the height of your elbows. 3. Slowly move your weight onto your front foot until you feel a stretch across your chest and in the front of your shoulders. Keep your head and chest upright and keep your abdominal muscles tight. 4. Hold for __________ seconds. 5. To release the stretch, shift your weight to your back foot. Repeat __________ times. Complete this stretch __________ times a day. Exercise F:Extension, Standing 1. Stand and hold a broomstick, a cane, or a similar object behind your back. ? Your hands should be a little wider than shoulder-width apart. ? Your palms should face away from your back. 2. Keeping your elbows straight and keeping your shoulder muscles relaxed, move the stick away from your body until you feel a stretch in your shoulder. ? Avoid shrugging your shoulders while you move the stick. Keep your shoulder blade tucked down toward the middle of your back. 3. Hold for __________ seconds. 4. Slowly return to the starting position. Repeat __________ times. Complete this exercise __________ times a day. STRENGTHENING  EXERCISES These exercises build strength and endurance in your shoulder. Endurance is the ability to use your muscles for a long time, even after they get tired. Exercise G:External Rotation  1. Sit in a stable chair without armrests. 2. Secure an exercise band at elbow height on your left / right side. 3. Place a soft object, such as a folded towel or a small pillow, between your left / right upper arm and your body to move your elbow a few inches away (about 10 cm) from your side. 4. Hold the end of the band so it is tight and there is no slack. 5. Keeping your elbow pressed against the soft object, move your left / right forearm out, away from your abdomen. Keep your body steady so only your forearm moves. 6. Hold for __________ seconds. 7. Slowly return to the starting position. Repeat __________ times. Complete this exercise __________ times a day. Exercise H:Shoulder Abduction  1. Sit in a stable chair without armrests, or stand. 2. Hold a __________ weight in your left / right hand, or hold an exercise band with both hands.   3. Start with your arms straight down and your left / right palm facing in, toward your body. 4. Slowly lift your left / right hand out to your side. Do not lift your hand above shoulder height unless your health care provider tells you that this is safe. ? Keep your arms straight. ? Avoid shrugging your shoulder while you do this movement. Keep your shoulder blade tucked down toward the middle of your back. 5. Hold for __________ seconds. 6. Slowly lower your arm, and return to the starting position. Repeat __________ times. Complete this exercise __________ times a day. Exercise I:Shoulder Extension 1. Sit in a stable chair without armrests, or stand. 2. Secure an exercise band to a stable object in front of you where it is at shoulder height. 3. Hold one end of the exercise band in each hand. Your palms should face each other. 4. Straighten your elbows and  lift your hands up to shoulder height. 5. Step back, away from the secured end of the exercise band, until the band is tight and there is no slack. 6. Squeeze your shoulder blades together as you pull your hands down to the sides of your thighs. Stop when your hands are straight down by your sides. Do not let your hands go behind your body. 7. Hold for __________ seconds. 8. Slowly return to the starting position. Repeat __________ times. Complete this exercise __________ times a day. Exercise J:Standing Shoulder Row 1. Sit in a stable chair without armrests, or stand. 2. Secure an exercise band to a stable object in front of you so it is at waist height. 3. Hold one end of the exercise band in each hand. Your palms should be in a thumbs-up position. 4. Bend each of your elbows to an "L" shape (about 90 degrees) and keep your upper arms at your sides. 5. Step back until the band is tight and there is no slack. 6. Slowly pull your elbows back behind you. 7. Hold for __________ seconds. 8. Slowly return to the starting position. Repeat __________ times. Complete this exercise __________ times a day. Exercise K:Shoulder Press-Ups  1. Sit in a stable chair that has armrests. Sit upright, with your feet flat on the floor. 2. Put your hands on the armrests so your elbows are bent and your fingers are pointing forward. Your hands should be about even with the sides of your body. 3. Push down on the armrests and use your arms to lift yourself off of the chair. Straighten your elbows and lift yourself up as much as you comfortably can. ? Move your shoulder blades down, and avoid letting your shoulders move up toward your ears. ? Keep your feet on the ground. As you get stronger, your feet should support less of your body weight as you lift yourself up. 4. Hold for __________ seconds. 5. Slowly lower yourself back into the chair. Repeat __________ times. Complete this exercise __________ times a  day. Exercise L: Wall Push-Ups  1. Stand so you are facing a stable wall. Your feet should be about one arm-length away from the wall. 2. Lean forward and place your palms on the wall at shoulder height. 3. Keep your feet flat on the floor as you bend your elbows and lean forward toward the wall. 4. Hold for __________ seconds. 5. Straighten your elbows to push yourself back to the starting position. Repeat __________ times. Complete this exercise __________ times a day. This information is not intended to replace advice   given to you by your health care provider. Make sure you discuss any questions you have with your health care provider. Document Released: 01/15/2005 Document Revised: 11/26/2015 Document Reviewed: 11/12/2014 Elsevier Interactive Patient Education  2018 [employer]  

## 2018-07-06 ENCOUNTER — Telehealth: Payer: Self-pay | Admitting: *Deleted

## 2018-07-06 NOTE — Telephone Encounter (Signed)
07/06/18 LMOM @ 10:00am, RE: follow up appointment.

## 2018-10-27 DIAGNOSIS — E785 Hyperlipidemia, unspecified: Secondary | ICD-10-CM | POA: Diagnosis not present

## 2018-10-27 DIAGNOSIS — I10 Essential (primary) hypertension: Secondary | ICD-10-CM | POA: Diagnosis not present

## 2018-10-27 DIAGNOSIS — F419 Anxiety disorder, unspecified: Secondary | ICD-10-CM | POA: Diagnosis not present

## 2018-10-27 DIAGNOSIS — K219 Gastro-esophageal reflux disease without esophagitis: Secondary | ICD-10-CM | POA: Diagnosis not present

## 2018-11-01 ENCOUNTER — Telehealth: Payer: Self-pay

## 2018-11-01 NOTE — Telephone Encounter (Signed)
PT request refill request for Plavix. Pt last seen 08/2017 and was no show for 12/2017. PT has no upcoming appts at [facility]. How to proceed?

## 2018-11-01 NOTE — Telephone Encounter (Signed)
I called pts PCP to find out the last time she was seen at the office. The rep stated pt was last seen by [name] on 10/28/2018 for an appt. I stated we receive a refill on plavix and pt was last seen at our office on 08/2017.Lindsey [name]

## 2018-11-01 NOTE — Telephone Encounter (Signed)
She can continue to have this medication refilled by either cardiology or PCP.  She has not been evaluated in over 1 year.

## 2018-11-01 NOTE — Telephone Encounter (Signed)
I called [facility] on [address] to state forward the refill on plavix to her PCP. I stated pt was last seen at our office on 08/2017 and saw her PCP Dr. Jory Ee on 10/28/2018. They will forward plavix refill to PCP.

## 2018-11-02 ENCOUNTER — Encounter (HOSPITAL_COMMUNITY): Payer: Self-pay

## 2018-11-02 ENCOUNTER — Ambulatory Visit: Payer: Self-pay

## 2018-11-02 ENCOUNTER — Inpatient Hospital Stay (HOSPITAL_COMMUNITY)
Admission: EM | Admit: 2018-11-02 | Discharge: 2018-11-04 | DRG: 439 | Disposition: A | Discharge status: Home / Self Care | Payer: Medicare Other | Attending: Family Medicine | Admitting: Family Medicine

## 2018-11-02 ENCOUNTER — Ambulatory Visit (INDEPENDENT_AMBULATORY_CARE_PROVIDER_SITE_OTHER)
Admission: EM | Admit: 2018-11-02 | Discharge: 2018-11-02 | Disposition: A | Discharge status: Home / Self Care | Payer: Medicare Other | Source: Home / Self Care

## 2018-11-02 ENCOUNTER — Other Ambulatory Visit: Payer: Self-pay

## 2018-11-02 DIAGNOSIS — R109 Unspecified abdominal pain: Secondary | ICD-10-CM | POA: Diagnosis not present

## 2018-11-02 DIAGNOSIS — Z955 Presence of coronary angioplasty implant and graft: Secondary | ICD-10-CM | POA: Diagnosis not present

## 2018-11-02 DIAGNOSIS — K852 Alcohol induced acute pancreatitis without necrosis or infection: Principal | ICD-10-CM | POA: Diagnosis present

## 2018-11-02 DIAGNOSIS — Z9049 Acquired absence of other specified parts of digestive tract: Secondary | ICD-10-CM

## 2018-11-02 DIAGNOSIS — D6859 Other primary thrombophilia: Secondary | ICD-10-CM | POA: Diagnosis present

## 2018-11-02 DIAGNOSIS — H538 Other visual disturbances: Secondary | ICD-10-CM | POA: Diagnosis present

## 2018-11-02 DIAGNOSIS — I252 Old myocardial infarction: Secondary | ICD-10-CM | POA: Diagnosis not present

## 2018-11-02 DIAGNOSIS — R748 Abnormal levels of other serum enzymes: Secondary | ICD-10-CM

## 2018-11-02 DIAGNOSIS — K219 Gastro-esophageal reflux disease without esophagitis: Secondary | ICD-10-CM | POA: Diagnosis present

## 2018-11-02 DIAGNOSIS — Z8673 Personal history of transient ischemic attack (TIA), and cerebral infarction without residual deficits: Secondary | ICD-10-CM

## 2018-11-02 DIAGNOSIS — Z803 Family history of malignant neoplasm of breast: Secondary | ICD-10-CM

## 2018-11-02 DIAGNOSIS — I10 Essential (primary) hypertension: Secondary | ICD-10-CM | POA: Diagnosis present

## 2018-11-02 DIAGNOSIS — I2510 Atherosclerotic heart disease of native coronary artery without angina pectoris: Secondary | ICD-10-CM | POA: Diagnosis not present

## 2018-11-02 DIAGNOSIS — E7800 Pure hypercholesterolemia, unspecified: Secondary | ICD-10-CM | POA: Diagnosis present

## 2018-11-02 DIAGNOSIS — E876 Hypokalemia: Secondary | ICD-10-CM | POA: Diagnosis present

## 2018-11-02 DIAGNOSIS — K859 Acute pancreatitis without necrosis or infection, unspecified: Secondary | ICD-10-CM | POA: Diagnosis present

## 2018-11-02 DIAGNOSIS — E869 Volume depletion, unspecified: Secondary | ICD-10-CM | POA: Diagnosis present

## 2018-11-02 DIAGNOSIS — Z7902 Long term (current) use of antithrombotics/antiplatelets: Secondary | ICD-10-CM

## 2018-11-02 DIAGNOSIS — Z8249 Family history of ischemic heart disease and other diseases of the circulatory system: Secondary | ICD-10-CM

## 2018-11-02 DIAGNOSIS — R1010 Upper abdominal pain, unspecified: Secondary | ICD-10-CM

## 2018-11-02 DIAGNOSIS — I69398 Other sequelae of cerebral infarction: Secondary | ICD-10-CM

## 2018-11-02 DIAGNOSIS — Z20828 Contact with and (suspected) exposure to other viral communicable diseases: Secondary | ICD-10-CM | POA: Diagnosis present

## 2018-11-02 DIAGNOSIS — Z87891 Personal history of nicotine dependence: Secondary | ICD-10-CM

## 2018-11-02 DIAGNOSIS — E785 Hyperlipidemia, unspecified: Secondary | ICD-10-CM | POA: Diagnosis present

## 2018-11-02 DIAGNOSIS — Z823 Family history of stroke: Secondary | ICD-10-CM

## 2018-11-02 DIAGNOSIS — Z03818 Encounter for observation for suspected exposure to other biological agents ruled out: Secondary | ICD-10-CM | POA: Diagnosis not present

## 2018-11-02 DIAGNOSIS — I69311 Memory deficit following cerebral infarction: Secondary | ICD-10-CM

## 2018-11-02 HISTORY — DX: Acute pancreatitis without necrosis or infection, unspecified: K85.90

## 2018-11-02 LAB — COMPREHENSIVE METABOLIC PANEL
ALT: 57 U/L — ABNORMAL HIGH (ref 0–44)
AST: 48 U/L — ABNORMAL HIGH (ref 15–41)
Albumin: 3.9 g/dL (ref 3.5–5.0)
Alkaline Phosphatase: 139 U/L — ABNORMAL HIGH (ref 38–126)
Anion gap: 16 — ABNORMAL HIGH (ref 5–15)
BUN: 7 mg/dL (ref 6–20)
CO2: 23 mmol/L (ref 22–32)
Calcium: 9.6 mg/dL (ref 8.9–10.3)
Chloride: 99 mmol/L (ref 98–111)
Creatinine, Ser: 0.99 mg/dL (ref 0.44–1.00)
GFR calc Af Amer: >60 mL/min (ref >60)
GFR calc non Af Amer: >60 mL/min (ref >60)
Glucose, Bld: 150 mg/dL — ABNORMAL HIGH (ref 70–99)
Potassium: 3.1 mmol/L — ABNORMAL LOW (ref 3.5–5.1)
Sodium: 138 mmol/L (ref 135–145)
Total Bilirubin: 1.4 mg/dL — ABNORMAL HIGH (ref 0.3–1.2)
Total Protein: 7.3 g/dL (ref 6.5–8.1)

## 2018-11-02 LAB — TRIGLYCERIDES: Triglycerides: 105 mg/dL (ref <150)

## 2018-11-02 LAB — LIPASE, BLOOD: Lipase: 379 U/L — ABNORMAL HIGH (ref 11–51)

## 2018-11-02 LAB — CBC
HCT: 46.6 % — ABNORMAL HIGH (ref 36.0–46.0)
Hemoglobin: 15.1 g/dL — ABNORMAL HIGH (ref 12.0–15.0)
MCH: 30.6 pg (ref 26.0–34.0)
MCHC: 32.4 g/dL (ref 30.0–36.0)
MCV: 94.5 fL (ref 80.0–100.0)
Platelets: 223 10*3/uL (ref 150–400)
RBC: 4.93 MIL/uL (ref 3.87–5.11)
RDW: 13.4 % (ref 11.5–15.5)
WBC: 11.9 10*3/uL — ABNORMAL HIGH (ref 4.0–10.5)
nRBC: 0 % (ref 0.0–0.2)

## 2018-11-02 LAB — I-STAT BETA HCG BLOOD, ED (MC, WL, AP ONLY): I-stat hCG, quantitative: <5 m[IU]/mL (ref <5)

## 2018-11-02 MED ORDER — ONDANSETRON HCL 4 MG/2ML IJ SOLN
4.0000 mg | Freq: Once | INTRAMUSCULAR | Status: AC
  Administered 2018-11-03: 4 mg via INTRAVENOUS
  Filled 2018-11-02: qty 2

## 2018-11-02 MED ORDER — SODIUM CHLORIDE 0.9% FLUSH: 3.0000 mL | Freq: Once | INTRAVENOUS | Status: DC

## 2018-11-02 MED ORDER — HYDROMORPHONE HCL 1 MG/ML IJ SOLN
1.0000 mg | Freq: Once | INTRAMUSCULAR | Status: AC
  Administered 2018-11-03: 1 mg via INTRAVENOUS
  Filled 2018-11-02: qty 1

## 2018-11-02 MED ORDER — SODIUM CHLORIDE 0.9 % IV BOLUS
1000.0000 mL | Freq: Once | INTRAVENOUS | Status: AC
  Administered 2018-11-03: 1000 mL via INTRAVENOUS

## 2018-11-02 NOTE — ED Provider Notes (Signed)
[facility] [facility] EMERGENCY DEPARTMENT Provider Note   CSN: [id number] Arrival date & time: 11/02/18  1812     History   Chief Complaint Chief Complaint  Patient presents with  . Abdominal Pain  . Emesis    HPI Lindsey [name] is a 48 y.o. female.     Patient presents to the emergency department with a chief complaint of epigastric abdominal pain.  She reports having the symptoms for the past 2 to 3 days.  She reports associated persistent nausea and vomiting.  She has not been able to keep anything down.  She has a history of pancreatitis, and had a cholecystectomy in 2018.  She states that she has been drinking some alcohol with friends.  She also reports having high cholesterol.  She denies any fevers or chills.  She has tried taking Tylenol with no relief of her symptoms.  The history is provided by the patient. No language interpreter was used.  Emesis   Past Medical History:  Diagnosis Date  . Anemia   . Antithrombin III deficiency ([place])    ?pt not sure if true diagnosis  . Anxiety   . Apical mural thrombus   . Blood transfusion   . Cholecystitis 07/2016  . Coronary artery disease    a. apical LAD infarction '00. b. NSTEMI s/p BMS to prox LAD '09. c. Cath 01/2015: stable LAD stent, otherwise minimal nonobstructive CAD.  Marland Kitchen GERD (gastroesophageal reflux disease)   . Hyperlipidemia   . Hypertension   . Myocardial infarction ([place])   . Noncompliance with medication regimen    a. h/o noncompliance with med regimen (previous running out of Coumadin).  . Stroke (cerebrum) ([place]) 08/2017  . Stroke ([place])    assoc with short term memory loss and right peripheral vision loss; age 3   . TIA (transient ischemic attack) 2010  . Tobacco abuse     Patient Active Problem List   Diagnosis Date Noted  . Chronic left shoulder pain 11/27/2017  . PMB (postmenopausal bleeding) 09/23/2017  . Acute blood loss anemia   . Vascular headache   . Hemiparesis affecting left  side as late effect of stroke ([place])   . Tobacco use disorder 07/13/2017  . Noncompliance with medication regimen 07/13/2017  . Acute ischemic right MCA stroke ([place]) 07/13/2017  . Benign essential HTN   . Stroke (cerebrum) ([place]) 07/08/2017  . Middle cerebral artery embolism, right 07/08/2017  . Vitamin B12 deficiency 08/15/2016  . Acute cholecystitis 08/14/2016  . GERD (gastroesophageal reflux disease) 04/24/2015  . Hypertensive urgency 04/23/2015  . Chest pain, unspecified   . Right leg swelling 03/07/2015  . Preventative health care 02/21/2015  . Esophageal reflux   . NSTEMI (non-ST elevated myocardial infarction) ([place]) 01/28/2015  . Nausea without vomiting 12/22/2014  . Dysuria 12/21/2014  . Diarrhea 09/26/2014  . Difficulty swallowing pills 09/26/2014  . Hemorrhoids 06/16/2014  . Elevated AST (SGOT) 03/27/2014  . Lumbar herniated disc 03/27/2014  . TIA (transient ischemic attack) 02/19/2014  . History of ischemic left PCA stroke 02/19/2014  . Palpitations 01/27/2014  . Depression 08/15/2013  . Long term current use of anticoagulant therapy 08/10/2013  . Gastritis and duodenitis 08/03/2013  . Diverticulosis 08/03/2013  . Fatty liver 08/03/2013  . Esophagitis 08/03/2013  . Hiatal hernia 08/03/2013  . Apical mural thrombus 08/02/2013  . GI bleeding 06/04/2012  . Hypokalemia 06/04/2012  . Antithrombin III deficiency ([place]) 06/04/2012  . Paresthesias 06/29/2011  . Hyperlipemia 06/07/2009  . Essential  hypertension 05/17/2009  . OBESITY-MORBID (>100') 03/13/2008  . CAD S/P  BMS to proximal LAD 2009-patent 01/29/15 06/28/2007    Past Surgical History:  Procedure Laterality Date  . CARDIAC CATHETERIZATION    . CARDIAC CATHETERIZATION N/A 01/29/2015   Procedure: Left Heart Cath and Coronary Angiography;  Surgeon: Peter M SwazilandJordan, MD;  [facility];  Service: Cardiovascular;  Laterality: N/A;  . CHOLECYSTECTOMY N/A 08/14/2016   Procedure: LAPAROSCOPIC  CHOLECYSTECTOMY;  Surgeon: Gaynelle AduWilson, Eric, MD;  [facility];  Service: General;  Laterality: N/A;  . COLONOSCOPY N/A 03/29/2014   Procedure: COLONOSCOPY;  Surgeon: Louis Meckelobert D Kaplan, MD;  [facility];  Service: Endoscopy;  Laterality: N/A;  . CORONARY ANGIOPLASTY    . ESOPHAGOGASTRODUODENOSCOPY N/A 06/09/2012   Procedure: ESOPHAGOGASTRODUODENOSCOPY (EGD);  Surgeon: Theda BelfastPatrick D Hung, MD;  [facility];  Service: Endoscopy;  Laterality: N/A;  . ESOPHAGOGASTRODUODENOSCOPY N/A 08/02/2013   Procedure: ESOPHAGOGASTRODUODENOSCOPY (EGD);  Surgeon: Meryl DareMalcolm T Stark, MD;  [facility];  Service: Endoscopy;  Laterality: N/A;  . IR ANGIO INTRA EXTRACRAN SEL COM CAROTID INNOMINATE UNI R MOD SED  07/08/2017  . LEFT HEART CATHETERIZATION WITH CORONARY ANGIOGRAM N/A 12/24/2011   Procedure: LEFT HEART CATHETERIZATION WITH CORONARY ANGIOGRAM;  Surgeon: Kathleene Hazelhristopher D McAlhany, MD;  [facility];  Service: Cardiovascular;  Laterality: N/A;  . RADIOLOGY WITH ANESTHESIA N/A 07/08/2017   Procedure: IR WITH ANESTHESIA;  Surgeon: Radiologist, Medication, MD;  Location: [facility] OR;  Service: Radiology;  Laterality: N/A;  . TUBAL LIGATION       OB History    Gravida  2   Para  2   Term  2   Preterm      AB      Living        SAB      TAB      Ectopic      Multiple      Live Births               Home Medications    Prior to Admission medications   Medication Sig Start Date End Date Taking? Authorizing Provider  acetaminophen (TYLENOL) 325 MG tablet Take 1-2 tablets (325-650 mg total) by mouth every 4 (four) hours as needed for mild pain. 07/14/17   Love, Evlyn KannerPamela S, PA-C  amLODipine (NORVASC) 10 MG tablet Take 10 mg by mouth daily. 08/20/17   [provider]  atorvastatin (LIPITOR) 40 MG tablet Take 1 tablet (40 mg total) by mouth daily. 07/21/17   Love, Evlyn KannerPamela S, PA-C  carvedilol (COREG) 25 MG tablet TAKE 1 TABLET BY MOUTH TWICE DAILY WITH MEALS 08/19/17    Kirsteins, Victorino SparrowAndrew E, MD  clopidogrel (PLAVIX) 75 MG tablet Take 1 tablet (75 mg total) by mouth daily. 08/24/17   Lavena BullionMcCrue, Jessica, NP  gabapentin (NEURONTIN) 300 MG capsule Take 1 capsule (300 mg total) by mouth at bedtime. 11/27/17   Kirsteins, Victorino SparrowAndrew E, MD  hydrochlorothiazide (HYDRODIURIL) 25 MG tablet Take 12.5 mg by mouth daily. 08/20/17   [provider]  nitroGLYCERIN (NITROSTAT) 0.4 MG SL tablet Place 1 tablet (0.4 mg total) under the tongue every 5 (five) minutes as needed for chest pain (up to 3 doses). 08/16/16   Rodolph Bonghompson, Daniel V, MD  pantoprazole (PROTONIX) 40 MG tablet Take 40 mg by mouth daily. 09/18/17   [provider]  vitamin B-12 (CYANOCOBALAMIN) 1000 MCG tablet Take 1 tablet (1,000 mcg total) by mouth daily. 07/21/17   Jacquelynn CreeLove, Pamela S, PA-C  XARELTO 20 MG TABS tablet TAKE 1 TABLET BY MOUTH ONCE DAILY WITH SUPPER 02/04/18   Jodelle Gross, NP    Family History Family History  Problem Relation Age of Onset  . Stroke Father   . Heart disease Brother        arrhythmia; died  . Breast cancer Maternal Aunt     Social History Social History   Tobacco Use  . Smoking status: Former Smoker    Packs/day: 0.20    Years: 1.00    Pack years: 0.20    Types: Cigarettes  . Smokeless tobacco: Never Used  . Tobacco comment: 2018  " i STILL SMOKE 3  CIGARETTES A DAY "  Substance Use Topics  . Alcohol use: Yes    Alcohol/week: 0.0 standard drinks    Comment: Social drinker  . Drug use: Not Currently    Types: Cocaine    Comment: hx of use x 1 in the past     Allergies   Patient has no known allergies.   Review of Systems Review of Systems  All other systems reviewed and are negative.    Physical Exam Updated Vital Signs BP (!) 140/129 (BP Location: Right Arm)   Pulse 91   Temp 98.2 F (36.8 C) (Oral)   Resp 18   Ht 5\' 6"  (1.676 m)   Wt 77.1 kg   SpO2 98%   BMI 27.44 kg/m   Physical Exam Vitals signs and nursing note reviewed.   Constitutional:      General: She is not in acute distress.    Appearance: She is well-developed.  HENT:     Head: Normocephalic and atraumatic.  Eyes:     Conjunctiva/sclera: Conjunctivae normal.  Neck:     Musculoskeletal: Neck supple.  Cardiovascular:     Rate and Rhythm: Normal rate and regular rhythm.     Heart sounds: No murmur.  Pulmonary:     Effort: Pulmonary effort is normal. No respiratory distress.     Breath sounds: Normal breath sounds.  Abdominal:     Palpations: Abdomen is soft.     Tenderness: There is abdominal tenderness in the epigastric area.  Musculoskeletal: Normal range of motion.  Skin:    General: Skin is warm and dry.  Neurological:     Mental Status: She is alert and oriented to person, place, and time.  Psychiatric:        Mood and Affect: Mood normal.        Behavior: Behavior normal.      ED Treatments / Results  Labs (all labs ordered are listed, but only abnormal results are displayed) Labs Reviewed  LIPASE, BLOOD - Abnormal; Notable for the following components:      Result Value   Lipase 379 (*)    All other components within normal limits  COMPREHENSIVE METABOLIC PANEL - Abnormal; Notable for the following components:   Potassium 3.1 (*)    Glucose, Bld 150 (*)    AST 48 (*)    ALT 57 (*)    Alkaline Phosphatase 139 (*)    Total Bilirubin 1.4 (*)    Anion gap 16 (*)    All other components within normal limits  CBC - Abnormal; Notable for the following components:   WBC 11.9 (*)    Hemoglobin 15.1 (*)    HCT 46.6 (*)    All other components within normal limits  URINALYSIS, ROUTINE W REFLEX MICROSCOPIC  TRIGLYCERIDES  I-STAT BETA HCG BLOOD,  ED ([facility], [facility], [facility] ONLY)    EKG None  Radiology No results found.  Procedures Procedures (including critical care time)  Medications Ordered in ED Medications  sodium chloride flush (NS) 0.9 % injection 3 mL (has no administration in time range)  sodium chloride 0.9 % bolus 1,000  mL (has no administration in time range)  HYDROmorphone (DILAUDID) injection 1 mg (has no administration in time range)  ondansetron (ZOFRAN) injection 4 mg (has no administration in time range)     Initial Impression / Assessment and Plan / ED Course  I have reviewed the triage vital signs and the nursing notes.  Pertinent labs & imaging results that were available during my care of the patient were reviewed by me and considered in my medical decision making (see chart for details).        Patient with epigastric abdominal pain.  Symptoms have been ongoing for the past 2 days.  There is associated persistent nausea and vomiting.  Has history of pancreatitis.  Has been drinking some alcohol recently.  Also has high cholesterol.  Lipase is 379.  She is afebrile.  [place] blood cell count is minimally elevated.  We will give fluids, pain and nausea medicine, and plan for admission to the hospital.  Appreciate Dr. Dartha Lodgegbata for admitting.  Final Clinical Impressions(s) / ED Diagnoses   Final diagnoses:  Alcohol-induced acute pancreatitis, unspecified complication status    ED Discharge Orders    None       Roxy HorsemanBrowning, Fatin Bachicha, PA-C 11/03/18 0022    Virgina Norfolkuratolo, Adam, DO 11/03/18 [id number]

## 2018-11-02 NOTE — Telephone Encounter (Signed)
Incoming call from Patient who complains of abdominal pain.  That's radiate to back.  Has been occurring for the past three days Gradual onset. Rate the pain as severe. Constant pain.  Revied protocol . Recommended Patient go to Urgent care or Ed for further evaluation. Pt. Voiced understanding .  Will go to Urgent care. Reason for Disposition . [1] SEVERE pain (e.g., excruciating) AND [2] present > 1 hour  Answer Assessment - Initial Assessment Questions 1. LOCATION: "Where does it hurt?"      breast bone 2. RADIATION: "Does the pain shoot anywhere else?" (e.g., chest, back)      radiation 3. ONSET: "When did the pain begin?" (e.g., minutes, hours or days ago)      Past 3 days 4. SUDDEN: "Gradual or sudden onset?"    gradual 5. PATTERN "Does the pain come and go, or is it constant?"    - If constant: "Is it getting better, staying the same, or worsening?"      (Note: Constant means the pain never goes away completely; most serious pain is constant and it progresses)     - If intermittent: "How long does it last?" "Do you have pain now?"     (Note: Intermittent means the pain goes away completely between bouts)    constant 6. SEVERITY: "How bad is the pain?"  (e.g., Scale 1-10; mild, moderate, or severe)   - MILD (1-3): doesn't interfere with normal activities, abdomen soft and not tender to touch    - MODERATE (4-7): interferes with normal activities or awakens from sleep, tender to touch    - SEVERE (8-10): excruciating pain, doubled over, unable to do any normal activities      severe 7. RECURRENT SYMPTOM: "Have you ever had this type of abdominal pain before?" If so, ask: "When was the last time?" and "What happened that time?"      Yes , Removed gallblader 8. CAUSE: "What do you think is causing the abdominal pain?"     *No Answer* 9. RELIEVING/AGGRAVATING FACTORS: "What makes it better or worse?" (e.g., movement, antacids, bowel movement)     nothing 10. OTHER SYMPTOMS: "Has  there been any vomiting, diarrhea, constipation, or urine problems?"       vomiting11. PREGNANCY: "Is there any chance you are pregnant?" "When was your last menstrual period?"       dont have a period anymore  Protocols used: ABDOMINAL PAIN - [facility]

## 2018-11-02 NOTE — ED Triage Notes (Signed)
Pt here from [facility] for evaluation of generalized abdominal pain with radiation to back and vomiting x2 days. Sts it has gotten worse each day. Endorses central chest tightness. Hx MI x 2 and stroke x 2.

## 2018-11-02 NOTE — Discharge Instructions (Addendum)
Go to the emergency department for evaluation of your acute abdominal pain and vomiting. °

## 2018-11-02 NOTE — ED Notes (Signed)
Patient is being discharged from the Urgent [place] and sent to the Emergency Department via wheelchair by staff. Per NP, patient is stable but in need of higher level of care due to evaluation for abdominal pain. Patient is aware and verbalizes understanding of plan of care.  Vitals:   11/02/18 1751  BP: (!) 141/99  Pulse: (!) 121  Temp: 98.5 F (36.9 C)  SpO2: 98%

## 2018-11-02 NOTE — ED Triage Notes (Signed)
Patient presents to Urgent Care with complaints of abdominal pain since 2 days. Patient reports nausea and vomiting and unable to keep food down. Also states it is difficult to keep water down. Marland Kitchen

## 2018-11-02 NOTE — ED Provider Notes (Signed)
[facility]    CSN: [id number] Arrival date & time: 11/02/18  1656     History   Chief Complaint Chief Complaint  Patient presents with  . Abdominal Pain    HPI Lindsey [name] is a 48 y.o. female.   Patient presents with abdominal pain x2 days.  She reports she has vomited 10 times today and she is unable to keep down food/water.  She denies fever, chills, cough, shortness of breath, diarrhea.    The history is provided by the patient.    Past Medical History:  Diagnosis Date  . Anemia   . Antithrombin III deficiency (HCC)    ?pt not sure if true diagnosis  . Anxiety   . Apical mural thrombus   . Blood transfusion   . Cholecystitis 07/2016  . Coronary artery disease    a. apical LAD infarction '00. b. NSTEMI s/p BMS to prox LAD '09. c. Cath 01/2015: stable LAD stent, otherwise minimal nonobstructive CAD.  Marland Kitchen. GERD (gastroesophageal reflux disease)   . Hyperlipidemia   . Hypertension   . Myocardial infarction (HCC)   . Noncompliance with medication regimen    a. h/o noncompliance with med regimen (previous running out of Coumadin).  . 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 Stroke (HCC)    assoc with short term memory loss and right peripheral vision loss; age 48   . TIA (transient ischemic attack) 2010  . Tobacco abuse     Patient Active Problem List   Diagnosis Date Noted  . Chronic left shoulder pain 11/27/2017  . PMB (postmenopausal bleeding) 09/23/2017  . Acute blood loss anemia   . Vascular headache   . Hemiparesis affecting left side as late effect of stroke (HCC)   . Tobacco use disorder 07/13/2017  . Noncompliance with medication regimen 07/13/2017  . Acute ischemic right MCA stroke (HCC) 07/13/2017  . Benign essential HTN   . Stroke (cerebrum) (HCC) 07/08/2017  . Middle cerebral artery embolism, right 07/08/2017  . Vitamin B12 deficiency 08/15/2016  . Acute cholecystitis 08/14/2016  . GERD (gastroesophageal reflux disease) 04/24/2015  .  Hypertensive urgency 04/23/2015  . Chest pain, unspecified   . Right leg swelling 03/07/2015  . Preventative health care 02/21/2015  . Esophageal reflux   . NSTEMI (non-ST elevated myocardial infarction) (HCC) 01/28/2015  . Nausea without vomiting 12/22/2014  . Dysuria 12/21/2014  . Diarrhea 09/26/2014  . Difficulty swallowing pills 09/26/2014  . Hemorrhoids 06/16/2014  . Elevated AST (SGOT) 03/27/2014  . Lumbar herniated disc 03/27/2014  . TIA (transient ischemic attack) 02/19/2014  . History of ischemic left PCA stroke 02/19/2014  . Palpitations 01/27/2014  . Depression 08/15/2013  . Long term current use of anticoagulant therapy 08/10/2013  . Gastritis and duodenitis 08/03/2013  . Diverticulosis 08/03/2013  . Fatty liver 08/03/2013  . Esophagitis 08/03/2013  . Hiatal hernia 08/03/2013  . Apical mural thrombus 08/02/2013  . GI bleeding 06/04/2012  . Hypokalemia 06/04/2012  . Antithrombin III deficiency (HCC) 06/04/2012  . Paresthesias 06/29/2011  . Hyperlipemia 06/07/2009  . Essential hypertension 05/17/2009  . OBESITY-MORBID (>100') 03/13/2008  . CAD S/P  BMS to proximal LAD 2009-patent 01/29/15 06/28/2007    Past Surgical History:  Procedure Laterality Date  . CARDIAC CATHETERIZATION    . CARDIAC CATHETERIZATION N/A 01/29/2015   Procedure: Left Heart Cath and Coronary Angiography;  Surgeon: Peter M SwazilandJordan, MD;  [facility];  Service: Cardiovascular;  Laterality: N/A;  . CHOLECYSTECTOMY N/A 08/14/2016  Procedure: LAPAROSCOPIC CHOLECYSTECTOMY;  Surgeon: Gaynelle AduWilson, Eric, MD;  [facility];  Service: General;  Laterality: N/A;  . COLONOSCOPY N/A 03/29/2014   Procedure: COLONOSCOPY;  Surgeon: Louis Meckelobert D Kaplan, MD;  [facility];  Service: Endoscopy;  Laterality: N/A;  . CORONARY ANGIOPLASTY    . ESOPHAGOGASTRODUODENOSCOPY N/A 06/09/2012   Procedure: ESOPHAGOGASTRODUODENOSCOPY (EGD);  Surgeon: Theda BelfastPatrick D Hung, MD;  [facility];  Service:  Endoscopy;  Laterality: N/A;  . ESOPHAGOGASTRODUODENOSCOPY N/A 08/02/2013   Procedure: ESOPHAGOGASTRODUODENOSCOPY (EGD);  Surgeon: Meryl DareMalcolm T Stark, MD;  [facility];  Service: Endoscopy;  Laterality: N/A;  . IR ANGIO INTRA EXTRACRAN SEL COM CAROTID INNOMINATE UNI R MOD SED  07/08/2017  . LEFT HEART CATHETERIZATION WITH CORONARY ANGIOGRAM N/A 12/24/2011   Procedure: LEFT HEART CATHETERIZATION WITH CORONARY ANGIOGRAM;  Surgeon: Kathleene Hazelhristopher D McAlhany, MD;  [facility];  Service: Cardiovascular;  Laterality: N/A;  . RADIOLOGY WITH ANESTHESIA N/A 07/08/2017   Procedure: IR WITH ANESTHESIA;  Surgeon: Radiologist, Medication, MD;  Location: [facility] OR;  Service: Radiology;  Laterality: N/A;  . TUBAL LIGATION      OB History    Gravida  2   Para  2   Term  2   Preterm      AB      Living        SAB      TAB      Ectopic      Multiple      Live Births               Home Medications    Prior to Admission medications   Medication Sig Start Date End Date Taking? Authorizing Provider  acetaminophen (TYLENOL) 325 MG tablet Take 1-2 tablets (325-650 mg total) by mouth every 4 (four) hours as needed for mild pain. 07/14/17   Love, Evlyn KannerPamela S, PA-C  amLODipine (NORVASC) 10 MG tablet Take 10 mg by mouth daily. 08/20/17   [provider]  atorvastatin (LIPITOR) 40 MG tablet Take 1 tablet (40 mg total) by mouth daily. 07/21/17   Love, Evlyn KannerPamela S, PA-C  carvedilol (COREG) 25 MG tablet TAKE 1 TABLET BY MOUTH TWICE DAILY WITH MEALS 08/19/17   Kirsteins, Victorino SparrowAndrew E, MD  clopidogrel (PLAVIX) 75 MG tablet Take 1 tablet (75 mg total) by mouth daily. 08/24/17   Lavena BullionMcCrue, Jessica, NP  gabapentin (NEURONTIN) 300 MG capsule Take 1 capsule (300 mg total) by mouth at bedtime. 11/27/17   Kirsteins, Victorino SparrowAndrew E, MD  hydrochlorothiazide (HYDRODIURIL) 25 MG tablet Take 12.5 mg by mouth daily. 08/20/17   [provider]  nitroGLYCERIN (NITROSTAT) 0.4 MG SL tablet Place 1 tablet (0.4 mg total)  under the tongue every 5 (five) minutes as needed for chest pain (up to 3 doses). 08/16/16   Rodolph Bonghompson, Daniel V, MD  pantoprazole (PROTONIX) 40 MG tablet Take 40 mg by mouth daily. 09/18/17   [provider]  vitamin B-12 (CYANOCOBALAMIN) 1000 MCG tablet Take 1 tablet (1,000 mcg total) by mouth daily. 07/21/17   Love, Evlyn KannerPamela S, PA-C  XARELTO 20 MG TABS tablet TAKE 1 TABLET BY MOUTH ONCE DAILY WITH SUPPER 02/04/18   Jodelle GrossLawrence, Kathryn M, NP    Family History Family History  Problem Relation Age of Onset  . Stroke Father   . Heart disease Brother        arrhythmia; died  . Breast cancer Maternal Aunt     Social History Social History   Tobacco Use  . Smoking status: Former  Smoker    Packs/day: 0.20    Years: 1.00    Pack years: 0.20    Types: Cigarettes  . Smokeless tobacco: Never Used  . Tobacco comment: 2018  " i STILL SMOKE 3  CIGARETTES A DAY "  Substance Use Topics  . Alcohol use: Yes    Alcohol/week: 0.0 standard drinks    Comment: Social drinker  . Drug use: Not Currently    Types: Cocaine    Comment: hx of use x 1 in the past     Allergies   Patient has no known allergies.   Review of Systems Review of Systems  Constitutional: Negative for chills and fever.  HENT: Negative for ear pain and sore throat.   Eyes: Negative for pain and visual disturbance.  Respiratory: Negative for cough and shortness of breath.   Cardiovascular: Negative for chest pain and palpitations.  Gastrointestinal: Positive for abdominal pain, nausea and vomiting. Negative for diarrhea.  Genitourinary: Negative for dysuria and hematuria.  Musculoskeletal: Negative for arthralgias and back pain.  Skin: Negative for color change and rash.  Neurological: Negative for seizures and syncope.  All other systems reviewed and are negative.    Physical Exam Triage Vital Signs ED Triage Vitals  Enc Vitals Group     BP 11/02/18 1751 (!) 141/99     Pulse Rate 11/02/18 1751 (!) 121     Resp  --      Temp 11/02/18 1751 98.5 F (36.9 C)     Temp Source 11/02/18 1751 Oral     SpO2 11/02/18 1751 98 %     Weight --      Height --      Head Circumference --      Peak Flow --      Pain Score 11/02/18 1746 8     Pain Loc --      Pain Edu? --      Excl. in GC? --    No data found.  Updated Vital Signs BP (!) 141/99 (BP Location: Right Arm)   Pulse (!) 121   Temp 98.5 F (36.9 C) (Oral)   SpO2 98%   Visual Acuity Right Eye Distance:   Left Eye Distance:   Bilateral Distance:    Right Eye Near:   Left Eye Near:    Bilateral Near:     Physical Exam Vitals signs and nursing note reviewed.  Constitutional:      General: She is not in acute distress.    Appearance: She is well-developed.  HENT:     Head: Normocephalic and atraumatic.     Mouth/Throat:     Mouth: Mucous membranes are moist.     Pharynx: Oropharynx is clear.  Eyes:     Conjunctiva/sclera: Conjunctivae normal.  Neck:     Musculoskeletal: Neck supple.  Cardiovascular:     Rate and Rhythm: Normal rate and regular rhythm.     Heart sounds: No murmur.  Pulmonary:     Effort: Pulmonary effort is normal. No respiratory distress.     Breath sounds: Normal breath sounds.  Abdominal:     Palpations: Abdomen is soft.     Tenderness: There is abdominal tenderness. There is guarding. There is no right CVA tenderness, left CVA tenderness or rebound.  Skin:    General: Skin is warm and dry.  Neurological:     Mental Status: She is alert.      UC Treatments / Results  Labs (all labs ordered are  listed, but only abnormal results are displayed) Labs Reviewed - No data to display  EKG   Radiology No results found.  Procedures Procedures (including critical care time)  Medications Ordered in UC Medications - No data to display  Initial Impression / Assessment and Plan / UC Course  I have reviewed the triage vital signs and the nursing notes.  Pertinent labs & imaging results that were  available during my care of the patient were reviewed by me and considered in my medical decision making (see chart for details).   Abdominal pain, emesis.  Patient is acutely tender to palpation of her abdomen.  Sending to ED for evaluation.     Final Clinical Impressions(s) / UC Diagnoses   Final diagnoses:  Pain of upper abdomen     Discharge Instructions     Go to the emergency department for evaluation of your acute abdominal pain and vomiting.        ED Prescriptions    None     Controlled Substance Prescriptions [place] Controlled Substance Registry consulted? Not Applicable   Sharion Balloon, NP 11/02/18 [phone]

## 2018-11-03 ENCOUNTER — Observation Stay (HOSPITAL_COMMUNITY): Payer: Medicare Other

## 2018-11-03 ENCOUNTER — Encounter (HOSPITAL_COMMUNITY): Payer: Self-pay | Admitting: Internal Medicine

## 2018-11-03 DIAGNOSIS — K862 Cyst of pancreas: Secondary | ICD-10-CM | POA: Diagnosis not present

## 2018-11-03 DIAGNOSIS — K859 Acute pancreatitis without necrosis or infection, unspecified: Secondary | ICD-10-CM | POA: Diagnosis not present

## 2018-11-03 DIAGNOSIS — R109 Unspecified abdominal pain: Secondary | ICD-10-CM | POA: Diagnosis present

## 2018-11-03 DIAGNOSIS — R1013 Epigastric pain: Secondary | ICD-10-CM | POA: Diagnosis not present

## 2018-11-03 DIAGNOSIS — K5730 Diverticulosis of large intestine without perforation or abscess without bleeding: Secondary | ICD-10-CM | POA: Diagnosis not present

## 2018-11-03 DIAGNOSIS — K760 Fatty (change of) liver, not elsewhere classified: Secondary | ICD-10-CM | POA: Diagnosis not present

## 2018-11-03 DIAGNOSIS — R748 Abnormal levels of other serum enzymes: Secondary | ICD-10-CM

## 2018-11-03 LAB — URINALYSIS, ROUTINE W REFLEX MICROSCOPIC
Bilirubin Urine: NEGATIVE
Glucose, UA: NEGATIVE mg/dL
Hgb urine dipstick: NEGATIVE
Ketones, ur: 80 mg/dL — AB
Leukocytes,Ua: NEGATIVE
Nitrite: NEGATIVE
Protein, ur: 30 mg/dL — AB
Specific Gravity, Urine: 1.018 (ref 1.005–1.030)
pH: 5 (ref 5.0–8.0)

## 2018-11-03 LAB — LIPID PANEL
Cholesterol: 148 mg/dL (ref 0–200)
HDL: 68 mg/dL (ref >40)
LDL Cholesterol: 67 mg/dL (ref 0–99)
Total CHOL/HDL Ratio: 2.2 RATIO
Triglycerides: 64 mg/dL (ref <150)
VLDL: 13 mg/dL (ref 0–40)

## 2018-11-03 LAB — COMPREHENSIVE METABOLIC PANEL
ALT: 44 U/L (ref 0–44)
AST: 29 U/L (ref 15–41)
Albumin: 3.4 g/dL — ABNORMAL LOW (ref 3.5–5.0)
Alkaline Phosphatase: 126 U/L (ref 38–126)
Anion gap: 12 (ref 5–15)
BUN: 7 mg/dL (ref 6–20)
CO2: 23 mmol/L (ref 22–32)
Calcium: 9.3 mg/dL (ref 8.9–10.3)
Chloride: 104 mmol/L (ref 98–111)
Creatinine, Ser: 0.72 mg/dL (ref 0.44–1.00)
GFR calc Af Amer: >60 mL/min (ref >60)
GFR calc non Af Amer: >60 mL/min (ref >60)
Glucose, Bld: 115 mg/dL — ABNORMAL HIGH (ref 70–99)
Potassium: 3.7 mmol/L (ref 3.5–5.1)
Sodium: 139 mmol/L (ref 135–145)
Total Bilirubin: 1.1 mg/dL (ref 0.3–1.2)
Total Protein: 6.8 g/dL (ref 6.5–8.1)

## 2018-11-03 LAB — CBC
HCT: 43.2 % (ref 36.0–46.0)
Hemoglobin: 14.4 g/dL (ref 12.0–15.0)
MCH: 31 pg (ref 26.0–34.0)
MCHC: 33.3 g/dL (ref 30.0–36.0)
MCV: 92.9 fL (ref 80.0–100.0)
Platelets: 200 10*3/uL (ref 150–400)
RBC: 4.65 MIL/uL (ref 3.87–5.11)
RDW: 13.2 % (ref 11.5–15.5)
WBC: 15.7 10*3/uL — ABNORMAL HIGH (ref 4.0–10.5)
nRBC: 0 % (ref 0.0–0.2)

## 2018-11-03 LAB — PHOSPHORUS: Phosphorus: 2.8 mg/dL (ref 2.5–4.6)

## 2018-11-03 LAB — LIPASE, BLOOD: Lipase: 531 U/L — ABNORMAL HIGH (ref 11–51)

## 2018-11-03 LAB — MAGNESIUM: Magnesium: 1.6 mg/dL — ABNORMAL LOW (ref 1.7–2.4)

## 2018-11-03 LAB — SARS CORONAVIRUS 2 (TAT 6-24 HRS): SARS Coronavirus 2: NEGATIVE

## 2018-11-03 LAB — HIV ANTIBODY (ROUTINE TESTING W REFLEX): HIV Screen 4th Generation wRfx: NONREACTIVE

## 2018-11-03 MED ORDER — MAGNESIUM SULFATE 2 GM/50ML IV SOLN
2.0000 g | Freq: Once | INTRAVENOUS | Status: AC
  Administered 2018-11-03: 2 g via INTRAVENOUS
  Filled 2018-11-03: qty 50

## 2018-11-03 MED ORDER — GABAPENTIN 300 MG PO CAPS
300.0000 mg | ORAL_CAPSULE | Freq: Every day | ORAL | Status: DC
  Administered 2018-11-03: 300 mg via ORAL
  Filled 2018-11-03: qty 1

## 2018-11-03 MED ORDER — FOLIC ACID 1 MG PO TABS: 1.0000 mg | ORAL_TABLET | Freq: Every day | ORAL | Status: DC

## 2018-11-03 MED ORDER — THIAMINE HCL 100 MG/ML IJ SOLN: 100.0000 mg | Freq: Every day | INTRAMUSCULAR | Status: DC

## 2018-11-03 MED ORDER — LORAZEPAM 2 MG/ML IJ SOLN
1.0000 mg | Freq: Four times a day (QID) | INTRAMUSCULAR | Status: DC | PRN
  Administered 2018-11-03: 1 mg via INTRAVENOUS
  Filled 2018-11-03: qty 1

## 2018-11-03 MED ORDER — HYDROMORPHONE HCL 1 MG/ML IJ SOLN
0.5000 mg | INTRAMUSCULAR | Status: DC | PRN
  Administered 2018-11-03 – 2018-11-04 (×5): 0.5 mg via INTRAVENOUS
  Filled 2018-11-03 (×4): qty 1

## 2018-11-03 MED ORDER — ATORVASTATIN CALCIUM 40 MG PO TABS
40.0000 mg | ORAL_TABLET | Freq: Every day | ORAL | Status: DC
  Administered 2018-11-03 – 2018-11-04 (×2): 40 mg via ORAL
  Filled 2018-11-03 (×2): qty 1

## 2018-11-03 MED ORDER — HYDROMORPHONE HCL 1 MG/ML IJ SOLN
INTRAMUSCULAR | Status: AC
  Filled 2018-11-03: qty 1

## 2018-11-03 MED ORDER — ADULT MULTIVITAMIN W/MINERALS CH: 1.0000 | ORAL_TABLET | Freq: Every day | ORAL | Status: DC

## 2018-11-03 MED ORDER — CLOPIDOGREL BISULFATE 75 MG PO TABS
75.0000 mg | ORAL_TABLET | Freq: Every day | ORAL | Status: DC
  Administered 2018-11-03 – 2018-11-04 (×2): 75 mg via ORAL
  Filled 2018-11-03 (×2): qty 1

## 2018-11-03 MED ORDER — IOHEXOL 300 MG/ML  SOLN
100.0000 mL | Freq: Once | INTRAMUSCULAR | Status: AC | PRN
  Administered 2018-11-03: 100 mL via INTRAVENOUS

## 2018-11-03 MED ORDER — ADULT MULTIVITAMIN W/MINERALS CH
1.0000 | ORAL_TABLET | Freq: Every day | ORAL | Status: DC
  Administered 2018-11-03 – 2018-11-04 (×2): 1 via ORAL
  Filled 2018-11-03 (×2): qty 1

## 2018-11-03 MED ORDER — AMLODIPINE BESYLATE 10 MG PO TABS
10.0000 mg | ORAL_TABLET | Freq: Every day | ORAL | Status: DC
  Administered 2018-11-03 – 2018-11-04 (×2): 10 mg via ORAL
  Filled 2018-11-03 (×2): qty 1

## 2018-11-03 MED ORDER — POTASSIUM CHLORIDE 10 MEQ/100ML IV SOLN
10.0000 meq | INTRAVENOUS | Status: AC
  Administered 2018-11-03 (×2): 10 meq via INTRAVENOUS
  Filled 2018-11-03 (×2): qty 100

## 2018-11-03 MED ORDER — ONDANSETRON HCL 4 MG/2ML IJ SOLN
4.0000 mg | Freq: Four times a day (QID) | INTRAMUSCULAR | Status: AC
  Administered 2018-11-03 (×3): 4 mg via INTRAVENOUS
  Filled 2018-11-03 (×3): qty 2

## 2018-11-03 MED ORDER — HYDROMORPHONE HCL 1 MG/ML IJ SOLN
1.0000 mg | Freq: Once | INTRAMUSCULAR | Status: AC
  Administered 2018-11-03: 1 mg via INTRAVENOUS
  Filled 2018-11-03: qty 1

## 2018-11-03 MED ORDER — POTASSIUM CHLORIDE IN NACL 20-0.9 MEQ/L-% IV SOLN
INTRAVENOUS | Status: DC
  Administered 2018-11-03 – 2018-11-04 (×2): via INTRAVENOUS
  Filled 2018-11-03 (×3): qty 1000

## 2018-11-03 MED ORDER — LORAZEPAM 1 MG PO TABS: 1.0000 mg | ORAL_TABLET | Freq: Four times a day (QID) | ORAL | Status: DC | PRN

## 2018-11-03 MED ORDER — HYDRALAZINE HCL 20 MG/ML IJ SOLN: 10.0000 mg | INTRAMUSCULAR | Status: DC | PRN

## 2018-11-03 MED ORDER — PANTOPRAZOLE SODIUM 40 MG IV SOLR
40.0000 mg | Freq: Two times a day (BID) | INTRAVENOUS | Status: DC
  Administered 2018-11-03 – 2018-11-04 (×4): 40 mg via INTRAVENOUS
  Filled 2018-11-03 (×4): qty 40

## 2018-11-03 MED ORDER — VITAMIN B-1 100 MG PO TABS: 100.0000 mg | ORAL_TABLET | Freq: Every day | ORAL | Status: DC

## 2018-11-03 MED ORDER — POTASSIUM CHLORIDE 10 MEQ/100ML IV SOLN
10.0000 meq | INTRAVENOUS | Status: AC
  Administered 2018-11-03 (×2): 10 meq via INTRAVENOUS
  Filled 2018-11-03 (×2): qty 100

## 2018-11-03 MED ORDER — CARVEDILOL 25 MG PO TABS
25.0000 mg | ORAL_TABLET | Freq: Two times a day (BID) | ORAL | Status: DC
  Administered 2018-11-03 – 2018-11-04 (×3): 25 mg via ORAL
  Filled 2018-11-03 (×3): qty 1

## 2018-11-03 MED ORDER — RIVAROXABAN 20 MG PO TABS
20.0000 mg | ORAL_TABLET | Freq: Every day | ORAL | Status: DC
  Administered 2018-11-03: 20 mg via ORAL
  Filled 2018-11-03: qty 1

## 2018-11-03 MED ORDER — SUCRALFATE 1 GM/10ML PO SUSP
1.0000 g | Freq: Four times a day (QID) | ORAL | Status: DC
  Administered 2018-11-03 – 2018-11-04 (×6): 1 g via ORAL
  Filled 2018-11-03 (×6): qty 10

## 2018-11-03 MED ORDER — VITAMIN B-1 100 MG PO TABS
100.0000 mg | ORAL_TABLET | Freq: Every day | ORAL | Status: DC
  Administered 2018-11-03 – 2018-11-04 (×2): 100 mg via ORAL
  Filled 2018-11-03 (×2): qty 1

## 2018-11-03 MED ORDER — FOLIC ACID 1 MG PO TABS
1.0000 mg | ORAL_TABLET | Freq: Every day | ORAL | Status: DC
  Administered 2018-11-03 – 2018-11-04 (×2): 1 mg via ORAL
  Filled 2018-11-03 (×2): qty 1

## 2018-11-03 MED ORDER — HYDRALAZINE HCL 20 MG/ML IJ SOLN: 5.0000 mg | INTRAMUSCULAR | Status: DC | PRN

## 2018-11-03 MED ORDER — ONDANSETRON HCL 4 MG/2ML IJ SOLN
4.0000 mg | Freq: Four times a day (QID) | INTRAMUSCULAR | Status: DC | PRN
  Administered 2018-11-03: 4 mg via INTRAVENOUS
  Filled 2018-11-03: qty 2

## 2018-11-03 MED ORDER — VITAMIN B-12 1000 MCG PO TABS
1000.0000 ug | ORAL_TABLET | Freq: Every day | ORAL | Status: DC
  Administered 2018-11-03 – 2018-11-04 (×2): 1000 ug via ORAL
  Filled 2018-11-03 (×2): qty 1

## 2018-11-03 NOTE — H&P (Signed)
History and Physical  Lindsey [name]:[id number] DOB: [birth year] DOA: 11/02/2018  Referring physician: ER provider PCP: [name]  Outpatient Specialists:    Patient coming from: Home  Chief Complaint: Nausea and vomiting  HPI:  Patient is a 48 year old African-American female past medical history significant for Antithrombin III deficiency, strokes and myocardial infarction for which patient takes Xarelto and Plavix.  Patient has also undergone cholecystectomy.  Patient presents with 2 to 3-day history of abdominal pain, mainly at the epigastrium and radiating to the back with nausea and vomiting.  According to the patient, she is unable to keep anything down.  Patient denies use of NSAIDs.  Apparently, patient drank significantly last weekend with her friends, but denies being an alcoholic.  Lipase is elevated at 379, potassium is 3.1.  No headache, no neck pain, no fever or chills, no URI symptoms, no chest pain, no shortness of breath and no urinary symptoms.  Hospitalist team has been asked to admit patient for further work-up and management of possible acute pancreatitis.  ED Course: On presentation to the hospital, patient was afebrile, heart rate ranged from 91 to 121 bpm, respiratory rate of 18, blood pressure 140/99 mmHg and O2 sat of 98%.  Pertinent lab results revealed WBC of 11.5, hemoglobin of 15.1, potassium of 3.19 lipase of 379.  Patient be admitted for further assessment and management.  Pertinent labs: As above.  Review of Systems:  Negative for fever, visual changes, sore throat, rash, new muscle aches, chest pain, SOB, dysuria, bleeding.  Past Medical History:  Diagnosis Date  . Anemia   . Antithrombin III deficiency (HCC)    ?pt not sure if true diagnosis  . Anxiety   . Apical mural thrombus   . Blood transfusion   . Cholecystitis 07/2016  . Coronary artery disease    a. apical LAD infarction '00. b. NSTEMI s/p BMS to prox LAD '09. c. Cath 01/2015:  stable LAD stent, otherwise minimal nonobstructive CAD.  Marland Kitchen GERD (gastroesophageal reflux disease)   . Hyperlipidemia   . Hypertension   . Myocardial infarction (HCC)   . Noncompliance with medication regimen    a. h/o noncompliance with med regimen (previous running out of Coumadin).  . 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 Stroke (HCC)    assoc with short term memory loss and right peripheral vision loss; age 6   . TIA (transient ischemic attack) 2010  . Tobacco abuse     Past Surgical History:  Procedure Laterality Date  . CARDIAC CATHETERIZATION    . CARDIAC CATHETERIZATION N/A 01/29/2015   Procedure: Left Heart Cath and Coronary Angiography;  Surgeon: Peter M Swaziland, MD;  Location: [facility] INVASIVE CV LAB;  Service: Cardiovascular;  Laterality: N/A;  . CHOLECYSTECTOMY N/A 08/14/2016   Procedure: LAPAROSCOPIC CHOLECYSTECTOMY;  Surgeon: Gaynelle Adu, MD;  Location: [facility] OR;  Service: General;  Laterality: N/A;  . COLONOSCOPY N/A 03/29/2014   Procedure: COLONOSCOPY;  Surgeon: Louis Meckel, MD;  Location: [facility] ENDOSCOPY;  Service: Endoscopy;  Laterality: N/A;  . CORONARY ANGIOPLASTY    . ESOPHAGOGASTRODUODENOSCOPY N/A 06/09/2012   Procedure: ESOPHAGOGASTRODUODENOSCOPY (EGD);  Surgeon: Theda Belfast, MD;  Location: [facility] ENDOSCOPY;  Service: Endoscopy;  Laterality: N/A;  . ESOPHAGOGASTRODUODENOSCOPY N/A 08/02/2013   Procedure: ESOPHAGOGASTRODUODENOSCOPY (EGD);  Surgeon: Meryl Dare, MD;  Location: [facility] ENDOSCOPY;  Service: Endoscopy;  Laterality: N/A;  . IR ANGIO INTRA EXTRACRAN SEL COM CAROTID INNOMINATE UNI R MOD SED  07/08/2017  . LEFT HEART CATHETERIZATION WITH CORONARY ANGIOGRAM  N/A 12/24/2011   Procedure: LEFT HEART CATHETERIZATION WITH CORONARY ANGIOGRAM;  Surgeon: Kathleene Hazelhristopher D McAlhany, MD;  [facility];  Service: Cardiovascular;  Laterality: N/A;  . RADIOLOGY WITH ANESTHESIA N/A 07/08/2017   Procedure: IR WITH ANESTHESIA;  Surgeon: Radiologist, Medication, MD;  Location: [facility] OR;   Service: Radiology;  Laterality: N/A;  . TUBAL LIGATION       reports that she has quit smoking. Her smoking use included cigarettes. She has a 0.20 pack-year smoking history. She has never used smokeless tobacco. She reports current alcohol use. She reports previous drug use. Drug: Cocaine.  No Known Allergies  Family History  Problem Relation Age of Onset  . Stroke Father   . Heart disease Brother        arrhythmia; died  . Breast cancer Maternal Aunt      Prior to Admission medications   Medication Sig Start Date End Date Taking? Authorizing Provider  acetaminophen (TYLENOL) 325 MG tablet Take 1-2 tablets (325-650 mg total) by mouth every 4 (four) hours as needed for mild pain. 07/14/17   Love, Evlyn KannerPamela S, PA-C  amLODipine (NORVASC) 10 MG tablet Take 10 mg by mouth daily. 08/20/17   [provider]  atorvastatin (LIPITOR) 40 MG tablet Take 1 tablet (40 mg total) by mouth daily. 07/21/17   Love, Evlyn KannerPamela S, PA-C  carvedilol (COREG) 25 MG tablet TAKE 1 TABLET BY MOUTH TWICE DAILY WITH MEALS 08/19/17   Kirsteins, Victorino SparrowAndrew E, MD  clopidogrel (PLAVIX) 75 MG tablet Take 1 tablet (75 mg total) by mouth daily. 08/24/17   Lavena BullionMcCrue, Jessica, NP  gabapentin (NEURONTIN) 300 MG capsule Take 1 capsule (300 mg total) by mouth at bedtime. 11/27/17   Kirsteins, Victorino SparrowAndrew E, MD  hydrochlorothiazide (HYDRODIURIL) 25 MG tablet Take 12.5 mg by mouth daily. 08/20/17   [provider]  nitroGLYCERIN (NITROSTAT) 0.4 MG SL tablet Place 1 tablet (0.4 mg total) under the tongue every 5 (five) minutes as needed for chest pain (up to 3 doses). 08/16/16   Rodolph Bonghompson, Daniel V, MD  pantoprazole (PROTONIX) 40 MG tablet Take 40 mg by mouth daily. 09/18/17   [provider]  vitamin B-12 (CYANOCOBALAMIN) 1000 MCG tablet Take 1 tablet (1,000 mcg total) by mouth daily. 07/21/17   Love, Evlyn KannerPamela S, PA-C  XARELTO 20 MG TABS tablet TAKE 1 TABLET BY MOUTH ONCE DAILY WITH SUPPER 02/04/18   Jodelle GrossLawrence, Kathryn M, NP    Physical  Exam: Vitals:   11/02/18 1818 11/02/18 1826 11/02/18 2204  BP: (!) 133/104  (!) 140/129  Pulse: (!) 105  91  Resp: 18  18  Temp: 98.4 F (36.9 C)  98.2 F (36.8 C)  TempSrc: Oral  Oral  SpO2: 100%  98%  Weight:  77.1 kg   Height:  5\' 6"  (1.676 m)     Constitutional:  . Appears calm and comfortable Eyes:  . No pallor. No jaundice.  ENMT:  . external ears, nose appear normal Neck:  . Neck is supple. No JVD Respiratory:  . CTA bilaterally, no w/r/r.  . Respiratory effort normal. No retractions or accessory muscle use Cardiovascular:  . S1S2 . No LE extremity edema   Abdomen:  . Abdomen is soft with epigastric tenderness. Organs are difficult to assess. Neurologic:  . Awake and alert. . Moves all limbs.  Wt Readings from Last 3 Encounters:  11/02/18 77.1 kg  02/05/18 83 kg  12/25/17 82.6 kg    I have personally reviewed following labs and imaging studies  Labs on Admission:  CBC: Recent Labs  Lab 11/02/18 1832  WBC 11.9*  HGB 15.1*  HCT 46.6*  MCV 94.5  PLT 784   Basic Metabolic Panel: Recent Labs  Lab 11/02/18 1832  NA 138  K 3.1*  CL 99  CO2 23  GLUCOSE 150*  BUN 7  CREATININE 0.99  CALCIUM 9.6   Liver Function Tests: Recent Labs  Lab 11/02/18 1832  AST 48*  ALT 57*  ALKPHOS 139*  BILITOT 1.4*  PROT 7.3  ALBUMIN 3.9   Recent Labs  Lab 11/02/18 1832  LIPASE 379*   No results for input(s): AMMONIA in the last 168 hours. Coagulation Profile: No results for input(s): INR, PROTIME in the last 168 hours. Cardiac Enzymes: No results for input(s): CKTOTAL, CKMB, CKMBINDEX, TROPONINI in the last 168 hours. BNP (last 3 results) No results for input(s): PROBNP in the last 8760 hours. HbA1C: No results for input(s): HGBA1C in the last 72 hours. CBG: No results for input(s): GLUCAP in the last 168 hours. Lipid Profile: Recent Labs    11/02/18 1832  TRIG 105   Thyroid Function Tests: No results for input(s): TSH, T4TOTAL, FREET4,  T3FREE, THYROIDAB in the last 72 hours. Anemia Panel: No results for input(s): VITAMINB12, FOLATE, FERRITIN, TIBC, IRON, RETICCTPCT in the last 72 hours. Urine analysis:    Component Value Date/Time   COLORURINE STRAW (A) 07/09/2017 0016   APPEARANCEUR CLEAR 07/09/2017 0016   APPEARANCEUR Cloudy (A) 03/06/2015 1525   LABSPEC 1.016 07/09/2017 0016   PHURINE 7.0 07/09/2017 0016   GLUCOSEU NEGATIVE 07/09/2017 0016   HGBUR NEGATIVE 07/09/2017 0016   BILIRUBINUR NEGATIVE 07/09/2017 0016   BILIRUBINUR Small 03/06/2015 1534   BILIRUBINUR Negative 03/06/2015 1525   KETONESUR NEGATIVE 07/09/2017 0016   PROTEINUR NEGATIVE 07/09/2017 0016   UROBILINOGEN 1.0 03/06/2015 1534   UROBILINOGEN 1.0 12/20/2014 2232   NITRITE NEGATIVE 07/09/2017 0016   LEUKOCYTESUR NEGATIVE 07/09/2017 0016   LEUKOCYTESUR Negative 03/06/2015 1525   Sepsis Labs: @LABRCNTIP (procalcitonin:4,lacticidven:4) )No results found for this or any previous visit (from the past 240 hour(s)).    Radiological Exams on Admission: No results found.  Active Problems:   Abdominal pain   Assessment/Plan: Abdominal pain: This is mainly epigastric, radiates to the back. History of significant nausea and vomiting with elevated lipase. Patient also drank significantly last weekend. Lipase is elevated. Worrisome for acute pancreatitis. Cannot rule out gastritis/peptic ulcer disease Admit patient Right upper quadrant ultrasound CT abdomen and pelvis Supportive care (pain control, IV fluids, medication for nausea vomiting) Check lipid profile Correct abnormal electrolytes Check magnesium IV Protonix 40 mg every 12 hourly Low threshold to add Carafate Further management will depend on hospital course.  Possible acute pancreatitis: See above.  Volume depletion: See above.  Hypokalemia: Hold HCTZ Replete Check magnesium Follow renal function and electrolytes  Antithrombin III deficiency/stroke/MI: Continue Xarelto  and Plavix. Reconsider diuresis of anticoagulation if patient cannot keep Xarelto down   DVT prophylaxis: Patient is on Xarelto Code Status: Full code Family Communication:  Disposition Plan: Home eventually Consults called: None Admission status: Observation  Time spent: 65 minutes  Dana Allan, MD  [facility] Pager #: [phone] 7PM-7AM contact night coverage as above  11/03/2018, 12:38 AM

## 2018-11-03 NOTE — ED Notes (Signed)
Attempted report 

## 2018-11-03 NOTE — ED Notes (Signed)
Sent urine culture with the specimen 

## 2018-11-03 NOTE — Progress Notes (Signed)
Patient admitted after midnight. Hx of antithrombin III deficiency, stroke, MI taking xarelto and plavix presents with abdominal pain. Work up reveals lipase 379, potassium 3.1, magnesium 1.6 and CT revealing Acute interstitial edematous pancreatitis without evidence of pancreatic necrosis or acute necrotic collection. RUQ [place] with hepatic steatosis and post cholecystectomy without biliary dilation.lipid panel within limits of normal.  Provided with IV fluids, IV potassium and bowel rest. This continues with pain albeit improved as well as nausea.   A/P  #1. Acute pancreatitis. etoh consumption last weekend.  -bowel rest -IV fluids -continue protonix and carafate -advance diet as tolerated -scheduled zofran for 24 hours -monitor  #2. Hypokalemia. repleted and potassium level trending up -2 more runs ordered -IV fluids as noted above -hold hctz -provide mag IV as level 1.6.  -recheck in am  #3. Antithrombin III deficiency/stroke/MI. Home meds include Xarelto and plavix.  -continue home med   Santiago Glad m. Aanvi Voyles, NP

## 2018-11-03 NOTE — Telephone Encounter (Signed)
Fax confirmation received [facility] [address]  plavix refill (to pcp). [phone].

## 2018-11-03 NOTE — Progress Notes (Signed)
Lindsey [name] is a 48 y.o. female patient admitted from ED awake, alert - oriented  X 4 - no acute distress noted.  VSS - Blood pressure (!) 156/112, pulse 83, temperature 98.6 F (37 C), temperature source Oral, resp. rate 18, height 5\' 6"  (1.676 m), weight 77.1 kg, SpO2 100 %.    IV in place, occlusive dsg intact without redness.      Will cont to eval and treat per MD orders.  Vidal Schwalbe, RN 11/03/2018 2:56 AM

## 2018-11-03 NOTE — Progress Notes (Signed)
Pt to CT then to US

## 2018-11-03 NOTE — Progress Notes (Signed)
Pt vomited again x2. Requesting more anti-nausea medicine. Made NP on call. Awating on response.

## 2018-11-03 NOTE — Progress Notes (Signed)
0415 Pt back to 6N13 form CT. Pt c/o of abdominal pain and nausea.   Pt vomited x1 with green material. PRN zofran was given. Made NP aware about abd pain. X1 dilaudis 1 mg was ordered and given. Will continue to monitor pt.

## 2018-11-04 DIAGNOSIS — H538 Other visual disturbances: Secondary | ICD-10-CM | POA: Diagnosis present

## 2018-11-04 DIAGNOSIS — E876 Hypokalemia: Secondary | ICD-10-CM | POA: Diagnosis present

## 2018-11-04 DIAGNOSIS — Z955 Presence of coronary angioplasty implant and graft: Secondary | ICD-10-CM | POA: Diagnosis not present

## 2018-11-04 DIAGNOSIS — Z9049 Acquired absence of other specified parts of digestive tract: Secondary | ICD-10-CM | POA: Diagnosis not present

## 2018-11-04 DIAGNOSIS — Z7902 Long term (current) use of antithrombotics/antiplatelets: Secondary | ICD-10-CM | POA: Diagnosis not present

## 2018-11-04 DIAGNOSIS — Z87891 Personal history of nicotine dependence: Secondary | ICD-10-CM | POA: Diagnosis not present

## 2018-11-04 DIAGNOSIS — I2510 Atherosclerotic heart disease of native coronary artery without angina pectoris: Secondary | ICD-10-CM | POA: Diagnosis present

## 2018-11-04 DIAGNOSIS — K219 Gastro-esophageal reflux disease without esophagitis: Secondary | ICD-10-CM | POA: Diagnosis present

## 2018-11-04 DIAGNOSIS — I69311 Memory deficit following cerebral infarction: Secondary | ICD-10-CM | POA: Diagnosis not present

## 2018-11-04 DIAGNOSIS — Z803 Family history of malignant neoplasm of breast: Secondary | ICD-10-CM | POA: Diagnosis not present

## 2018-11-04 DIAGNOSIS — K859 Acute pancreatitis without necrosis or infection, unspecified: Secondary | ICD-10-CM | POA: Diagnosis present

## 2018-11-04 DIAGNOSIS — Z8249 Family history of ischemic heart disease and other diseases of the circulatory system: Secondary | ICD-10-CM | POA: Diagnosis not present

## 2018-11-04 DIAGNOSIS — Z823 Family history of stroke: Secondary | ICD-10-CM | POA: Diagnosis not present

## 2018-11-04 DIAGNOSIS — E869 Volume depletion, unspecified: Secondary | ICD-10-CM | POA: Diagnosis present

## 2018-11-04 DIAGNOSIS — I10 Essential (primary) hypertension: Secondary | ICD-10-CM | POA: Diagnosis present

## 2018-11-04 DIAGNOSIS — Z20828 Contact with and (suspected) exposure to other viral communicable diseases: Secondary | ICD-10-CM | POA: Diagnosis present

## 2018-11-04 DIAGNOSIS — K852 Alcohol induced acute pancreatitis without necrosis or infection: Principal | ICD-10-CM

## 2018-11-04 DIAGNOSIS — D6859 Other primary thrombophilia: Secondary | ICD-10-CM | POA: Diagnosis present

## 2018-11-04 DIAGNOSIS — E785 Hyperlipidemia, unspecified: Secondary | ICD-10-CM | POA: Diagnosis present

## 2018-11-04 DIAGNOSIS — R109 Unspecified abdominal pain: Secondary | ICD-10-CM | POA: Diagnosis not present

## 2018-11-04 DIAGNOSIS — I69398 Other sequelae of cerebral infarction: Secondary | ICD-10-CM | POA: Diagnosis not present

## 2018-11-04 DIAGNOSIS — E7800 Pure hypercholesterolemia, unspecified: Secondary | ICD-10-CM | POA: Diagnosis present

## 2018-11-04 DIAGNOSIS — I252 Old myocardial infarction: Secondary | ICD-10-CM | POA: Diagnosis not present

## 2018-11-04 LAB — BASIC METABOLIC PANEL WITH GFR
Anion gap: 6 (ref 5–15)
BUN: 6 mg/dL (ref 6–20)
CO2: 25 mmol/L (ref 22–32)
Calcium: 9.1 mg/dL (ref 8.9–10.3)
Chloride: 107 mmol/L (ref 98–111)
Creatinine, Ser: 0.73 mg/dL (ref 0.44–1.00)
GFR calc Af Amer: >60 mL/min
GFR calc non Af Amer: >60 mL/min
Glucose, Bld: 143 mg/dL — ABNORMAL HIGH (ref 70–99)
Potassium: 4.2 mmol/L (ref 3.5–5.1)
Sodium: 138 mmol/L (ref 135–145)

## 2018-11-04 MED ORDER — SUCRALFATE 1 G PO TABS: 1.0000 g | ORAL_TABLET | Freq: Four times a day (QID) | ORAL | 0 refills | Status: DC

## 2018-11-04 MED ORDER — OXYCODONE-ACETAMINOPHEN 5-325 MG PO TABS: 1.0000 | ORAL_TABLET | Freq: Four times a day (QID) | ORAL | 0 refills | Status: DC | PRN

## 2018-11-04 MED ORDER — HYDROCHLOROTHIAZIDE 25 MG PO TABS: 12.5000 mg | ORAL_TABLET | Freq: Every day | ORAL | 3 refills | Status: DC

## 2018-11-04 MED ORDER — RIVAROXABAN 20 MG PO TABS: 20.0000 mg | ORAL_TABLET | Freq: Every day | ORAL | 0 refills | Status: AC

## 2018-11-04 MED ORDER — CLOPIDOGREL BISULFATE 75 MG PO TABS: 75.0000 mg | ORAL_TABLET | Freq: Every day | ORAL | 4 refills | Status: DC

## 2018-11-04 MED ORDER — CARVEDILOL 25 MG PO TABS: 25.0000 mg | ORAL_TABLET | Freq: Two times a day (BID) | ORAL | 0 refills | Status: DC

## 2018-11-04 MED ORDER — AMLODIPINE BESYLATE 10 MG PO TABS: 10.0000 mg | ORAL_TABLET | Freq: Every day | ORAL | 3 refills | Status: DC

## 2018-11-04 MED ORDER — OXYCODONE-ACETAMINOPHEN 5-325 MG PO TABS
1.0000 | ORAL_TABLET | Freq: Four times a day (QID) | ORAL | Status: DC | PRN
  Administered 2018-11-04: 2 via ORAL
  Filled 2018-11-04: qty 2

## 2018-11-04 MED ORDER — SUCRALFATE 1 GM/10ML PO SUSP: 1.0000 g | Freq: Four times a day (QID) | ORAL | 0 refills | Status: DC

## 2018-11-04 MED ORDER — PANTOPRAZOLE SODIUM 40 MG PO TBEC: 40.0000 mg | DELAYED_RELEASE_TABLET | Freq: Two times a day (BID) | ORAL | Status: DC

## 2018-11-04 MED ORDER — PANTOPRAZOLE SODIUM 40 MG PO TBEC: 40.0000 mg | DELAYED_RELEASE_TABLET | Freq: Two times a day (BID) | ORAL | 0 refills | Status: DC

## 2018-11-04 MED ORDER — SIMETHICONE 80 MG PO CHEW: 80.0000 mg | CHEWABLE_TABLET | Freq: Four times a day (QID) | ORAL | Status: DC | PRN

## 2018-11-04 MED ORDER — ATORVASTATIN CALCIUM 40 MG PO TABS: 40.0000 mg | ORAL_TABLET | Freq: Every day | ORAL | 0 refills | Status: AC

## 2018-11-04 MED FILL — OXYCODONE W/APAP 5/325 TAB: 5-325 | 3 days supply | Qty: 10 | Fill #0

## 2018-11-04 MED FILL — XARELTO 20 MG TABLET: 20 | 30 days supply | Qty: 30 | Fill #0

## 2018-11-04 MED FILL — CARVEDILOL 25 MG TABLET: 25 | 30 days supply | Qty: 60 | Fill #0

## 2018-11-04 MED FILL — SUCRALFATE 1 GM TABLET: 1 | 10 days supply | Qty: 40 | Fill #0

## 2018-11-04 NOTE — Progress Notes (Signed)
AVS given and reviewed with pt. Medications discussed. Transitions of care pharmacy delivered medications to bedside. All questions answered to satisfaction. Pt verbalized understanding of information given. Pt escorted off the unit via wheelchair by staff member.

## 2018-11-04 NOTE — Plan of Care (Signed)
  Problem: Health Behavior/Discharge Planning: Goal: Ability to formulate a plan to maintain an alcohol-free life will improve Outcome: Progressing   Problem: Clinical Measurements: Goal: Complications related to the disease process, condition or treatment will be avoided or minimized Outcome: Progressing

## 2018-11-04 NOTE — Discharge Summary (Signed)
Physician Discharge Summary  Lindsey [name] SHF:[id number] DOB: [birth year] DOA: 11/02/2018  PCP: Lindsey [name]  Admit date: 11/02/2018 Discharge date: 11/04/2018  Time spent: 45 minutes  Recommendations for Outpatient Follow-up:  1. Follow up with PCP 1-2 weeks for evaluation of symptoms 2. Follow full liquid diet and advance as tolerated 3. Avoid alcohol   Discharge Diagnoses:  Principal Problem:   Pancreatitis, acute Active Problems:   Hypokalemia   Abdominal pain   Benign essential HTN   Antithrombin III deficiency (HCC)   History of ischemic left PCA stroke   Acute pancreatitis   Discharge Condition: stable   Diet recommendation: full liquid diet  Filed Weights   11/02/18 1826 11/03/18 0215  Weight: 77.1 kg 77.1 kg    History of present illness:  Patient is a 48 year old African-American female past medical history significant for Antithrombin III deficiency, strokes and myocardial infarction for which patient takes Xarelto and Plavix.  Patient has also undergone cholecystectomy.  Patient presented 8/19 with 2 to 3-day history of abdominal pain, mainly at the epigastrium and radiating to the back with nausea and vomiting.  According to the patient, she was unable to keep anything down.  Patient denied use of NSAIDs.  Apparently, patient drank significantly previous weekend with her friends, but denied being an alcoholic.  Lipase elevated at 379, potassium is 3.1.  No headache, no neck pain, no fever or chills, no URI symptoms, no chest pain, no shortness of breath and no urinary symptoms.  Hospitalist team  asked to admit patient for further work-up and management of possible acute pancreatitis.  Hospital Course:  #1. Acute pancreatitis. etoh consumption last weekend. Provided with vigorous IV fluids and bowel rest. At discharge tolerating full liquid. Pain managed with percocet. Will discharge with  protonix and carafate. Avoid etoh.   #2. Hypokalemia. repleted and  resolved. Provided with magnesium as well.  #3. Antithrombin III deficiency/stroke/MI. Home meds include Xarelto and plavix. continue home med  Procedures:  Consultations:    Discharge Exam: Vitals:   11/03/18 2141 11/04/18 0543  BP: (!) 110/92 106/84  Pulse: 89 81  Resp: 18 19  Temp: 98.9 F (37.2 C) 99.1 F (37.3 C)  SpO2: 93% 99%    General: awake alert no acute distress Cardiovascular: rrr no mgr no LE edema Respiratory: normal effort BS clear Abdomen: soft non-distended +BS   Discharge Instructions   Discharge Instructions    Call MD for:  persistant nausea and vomiting   Complete by: As directed    Call MD for:  severe uncontrolled pain   Complete by: As directed    Diet - low sodium heart healthy   Complete by: As directed    Discharge instructions   Complete by: As directed    Advance diet slowly as tolerated Take medications as prescribed Avoid alcohol Follow up with PCP 2-3 weeks for evaluation of symptoms as well as BP control   Increase activity slowly   Complete by: As directed      Allergies as of 11/04/2018   No Known Allergies     Medication List    STOP taking these medications   gabapentin 300 MG capsule Commonly known as: NEURONTIN   vitamin B-12 1000 MCG tablet Commonly known as: CYANOCOBALAMIN     TAKE these medications   acetaminophen 325 MG tablet Commonly known as: TYLENOL Take 1-2 tablets (325-650 mg total) by mouth every 4 (four) hours as needed for mild pain.   amLODipine 10 MG tablet  Commonly known as: NORVASC Take 1 tablet (10 mg total) by mouth daily.   atorvastatin 40 MG tablet Commonly known as: Lipitor Take 1 tablet (40 mg total) by mouth daily.   carvedilol 25 MG tablet Commonly known as: COREG Take 1 tablet (25 mg total) by mouth 2 (two) times daily with a meal.   clopidogrel 75 MG tablet Commonly known as: PLAVIX Take 1 tablet (75 mg total) by mouth daily.   hydrochlorothiazide 25 MG  tablet Commonly known as: HYDRODIURIL Take 0.5 tablets (12.5 mg total) by mouth daily.   nitroGLYCERIN 0.4 MG SL tablet Commonly known as: NITROSTAT Place 1 tablet (0.4 mg total) under the tongue every 5 (five) minutes as needed for chest pain (up to 3 doses).   oxyCODONE-acetaminophen 5-325 MG tablet Commonly known as: PERCOCET/ROXICET Take 1-2 tablets by mouth every 6 (six) hours as needed for moderate pain.   pantoprazole 40 MG tablet Commonly known as: PROTONIX Take 1 tablet (40 mg total) by mouth 2 (two) times daily.   rivaroxaban 20 MG Tabs tablet Commonly known as: Xarelto Take 1 tablet (20 mg total) by mouth daily. What changed: See the new instructions.   sucralfate 1 GM/10ML suspension Commonly known as: CARAFATE Take 10 mLs (1 g total) by mouth 4 (four) times daily.      No Known Allergies    The results of significant diagnostics from this hospitalization (including imaging, microbiology, ancillary and laboratory) are listed below for reference.    Significant Diagnostic Studies: Ct Abdomen Pelvis W Contrast  Result Date: 11/03/2018 CLINICAL DATA:  Pancreatitis, leukocytosis, fever EXAM: CT ABDOMEN AND PELVIS WITH CONTRAST TECHNIQUE: Multidetector CT imaging of the abdomen and pelvis was performed using the standard protocol following bolus administration of intravenous contrast. CONTRAST:  16mL OMNIPAQUE IOHEXOL 300 MG/ML  SOLN COMPARISON:  Numerous priors, most recently CT abdomen pelvis 06/23/2017 FINDINGS: Lower chest: Lung bases are clear. Normal heart size. No pericardial effusion. Hepatobiliary: No focal liver abnormality is seen. Patient is post cholecystectomy. No calcified intraductal gallstones. Pancreas: There is diffuse edematous thickening of the pancreatic parenchyma with surrounding peripancreatic phlegmonous change. No geographically hypoenhancing pancreatic parenchyma to suggest pancreatic necrosis or acute necrotic collection. There are a few  subcentimeter hypoattenuating cystic pancreatic foci which are unchanged from prior study and could reflect small pseudocyst versus side branch IPMN. Spleen: Normal in size without focal abnormality. Adrenals/Urinary Tract: Normal adrenal glands. Few subcentimeter hypoattenuating focus in both kidneys, too small to fully characterize on CT imaging but statistically likely benign. Kidneys are otherwise unremarkable, without renal calculi, suspicious lesion, or hydronephrosis. Some early excretion of contrast with media seen within the renal pelves. Bladder is unremarkable. Stomach/Bowel: Distal esophagus, stomach and duodenal sweep are unremarkable. No bowel wall thickening or dilatation. No evidence of obstruction. A normal appendix is visualized. Scattered colonic diverticula without focal pericolonic inflammation to suggest diverticulitis. Vascular/Lymphatic: Atherosclerotic plaque within the normal caliber aorta. No occlusive filling defects are identified in the vessels adjacent to the inflamed pancreas. Reactive adenopathy is present in the upper abdomen. No pathologically enlarged lymph nodes. Reproductive: Normal uterus. Bilateral ligation clips. No suspicious in the adnexal lesions. Other: No abdominopelvic free fluid or free gas. No bowel containing hernias. Small fat containing umbilical hernia. Musculoskeletal: No acute osseous abnormality or suspicious osseous lesion. IMPRESSION: Acute interstitial edematous pancreatitis without evidence of pancreatic necrosis or acute necrotic collection. Few subcentimeter hypoattenuating cystic pancreatic lesions, stable from prior studies as remote as 2015. Fi for ndings are almost certainly  benign, likely reflecting pseudocysts or side branch IPMN. However guidelines recommend continued surveillance at 2 year intervals until documentation of 10 years stability with pancreatic protocol CT or MRI. This recommendation follows ACR consensus guidelines: Management of  Incidental Pancreatic Cysts: A [place] Paper of the ACR Incidental Findings Committee. J Am Coll Radiol 2017;14:911-923. Aortic Atherosclerosis (ICD10-I70.0). Electronically Signed   By: Kreg ShropshirePrice  DeHay M.D.   On: 11/03/2018 03:29   [place] Abdomen Limited Ruq  Result Date: 11/03/2018 CLINICAL DATA:  Elevated lipase. EXAM: ULTRASOUND ABDOMEN LIMITED RIGHT UPPER QUADRANT COMPARISON:  CT earlier this day.  Ultrasound 08/14/2016 FINDINGS: Gallbladder: Surgically absent. Common bile duct: Diameter: 7 mm, normal postcholecystectomy. Liver: No focal lesion identified. Diffusely increased and heterogeneous in parenchymal echogenicity. Portal vein is patent on color Doppler imaging with normal direction of blood flow towards the liver. Other: None. IMPRESSION: 1. Hepatic steatosis. 2. Postcholecystectomy without biliary dilatation. Electronically Signed   By: Narda RutherfordMelanie  Sanford M.D.   On: 11/03/2018 03:15    Microbiology: Recent Results (from the past 240 hour(s))  SARS CORONAVIRUS 2 Nasal Swab Aptima Multi Swab     Status: None   Collection Time: 11/03/18 12:46 AM   Specimen: Aptima Multi Swab; Nasal Swab  Result Value Ref Range Status   SARS Coronavirus 2 NEGATIVE NEGATIVE Final    Comment: (NOTE) SARS-CoV-2 target nucleic acids are NOT DETECTED. The SARS-CoV-2 RNA is generally detectable in upper and lower respiratory specimens during the acute phase of infection. Negative results do not preclude SARS-CoV-2 infection, do not rule out co-infections with other pathogens, and should not be used as the sole basis for treatment or other patient management decisions. Negative results must be combined with clinical observations, patient history, and epidemiological information. The expected result is Negative. Fact Sheet for Patients: HairSlick.nohttps://www.fda.gov/media/138098/download Fact Sheet for Healthcare Providers: quierodirigir.comhttps://www.fda.gov/media/138095/download This test is not yet approved or cleared by the [place] FDA and  has been authorized for detection and/or diagnosis of SARS-CoV-2 by FDA under an Emergency Use Authorization (EUA). This EUA will remain  in effect (meaning this test can be used) for the duration of the COVID-19 declaration under Section 56 4(b)(1) of the Act, 21 U.S.C. section 360bbb-3(b)(1), unless the authorization is terminated or revoked sooner. Performed at [facility], [address] [place], [place] [address]      Labs: Basic Metabolic Panel: Recent Labs  Lab 11/02/18 1832 11/03/18 0834 11/04/18 0210  NA 138 139 138  K 3.1* 3.7 4.2  CL 99 104 107  CO2 23 23 25   GLUCOSE 150* 115* 143*  BUN 7 7 6   CREATININE 0.99 0.72 0.73  CALCIUM 9.6 9.3 9.1  MG  --  1.6*  --   PHOS  --  2.8  --    Liver Function Tests: Recent Labs  Lab 11/02/18 1832 11/03/18 0834  AST 48* 29  ALT 57* 44  ALKPHOS 139* 126  BILITOT 1.4* 1.1  PROT 7.3 6.8  ALBUMIN 3.9 3.4*   Recent Labs  Lab 11/02/18 1832 11/03/18 0834  LIPASE 379* 531*   No results for input(s): AMMONIA in the last 168 hours. CBC: Recent Labs  Lab 11/02/18 1832 11/03/18 0834  WBC 11.9* 15.7*  HGB 15.1* 14.4  HCT 46.6* 43.2  MCV 94.5 92.9  PLT 223 200   Cardiac Enzymes: No results for input(s): CKTOTAL, CKMB, CKMBINDEX, TROPONINI in the last 168 hours. BNP: BNP (last 3 results) No results for input(s): BNP in the last 8760 hours.  ProBNP (last 3 results) No results for input(s): PROBNP in the last 8760 hours.  CBG: No results for input(s): GLUCAP in the last 168 hours.     SignedGwenyth Bender:  Iliyana Convey M NP  [facility] 11/04/2018, 2:09 PM

## 2018-11-04 NOTE — Care Management Obs Status (Signed)
[place] NOTIFICATION   Patient Details  Name: ASSUNTA PUPO MRN: [id number] Date of Birth: 1970-09-21   Medicare Observation Status Notification Given:  Yes    Carles Collet, RN 11/04/2018, 10:33 AM

## 2018-11-09 IMAGING — MR MR HEAD W/O CM
9 of 10 series · 36 of 48 positions shown · non-contrast
Comparison: 07/08/2017 CT head.  02/20/2014 MRI head.

CLINICAL DATA: 46 y/o  F; stroke for follow-up.

EXAM:
MRI HEAD WITHOUT CONTRAST
TECHNIQUE: Multiplanar, multiecho pulse sequences of the brain and surrounding
structures were obtained without intravenous contrast.

[Series 3: DWI · axial · 3.0mm · 1.09mm/px · z∈[-43,+94]mm · 9 of 94 slices shown (1 of 4)]
[im 1/94]
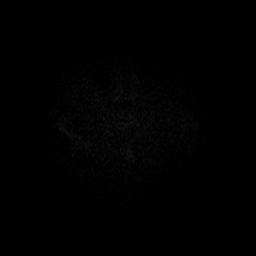
[im 12/94]
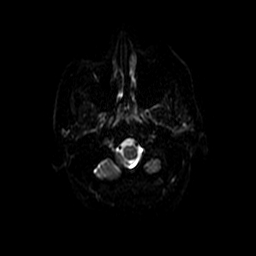
[im 24/94]
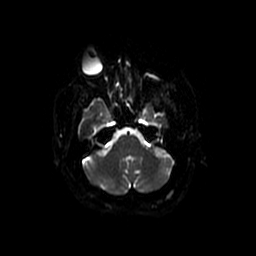
[im 35/94]
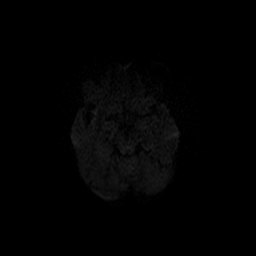
[im 47/94]
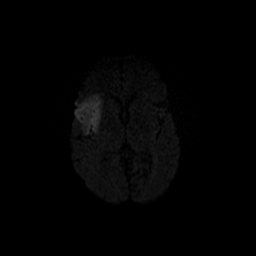
[im 59/94]
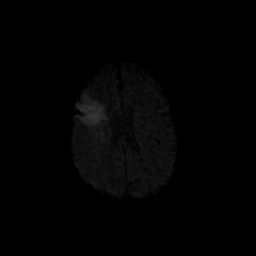
[im 70/94]
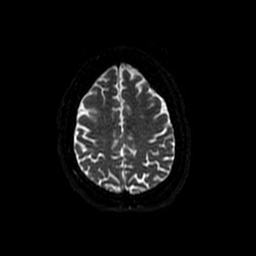
[im 82/94]
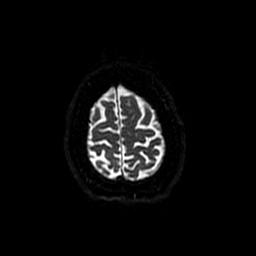
[im 94/94]
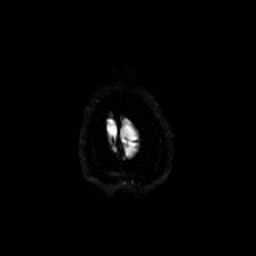

[Series 4: DWI · coronal · 5.0mm · 1.09mm/px · 7 of 66 slices shown (2 of 4)]
[im 1/66]
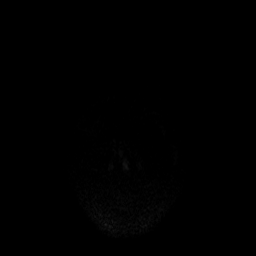
[im 11/66]
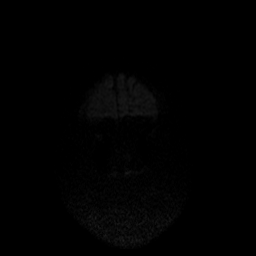
[im 22/66]
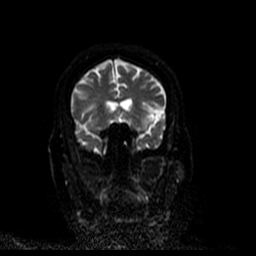
[im 33/66]
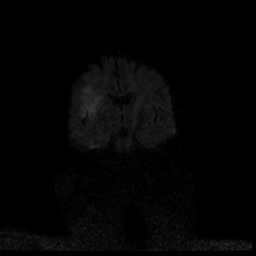
[im 44/66]
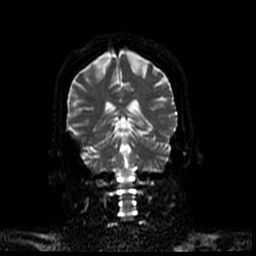
[im 55/66]
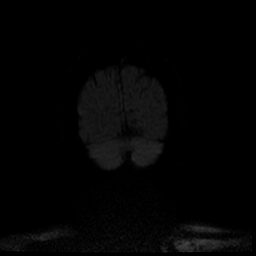
[im 66/66]
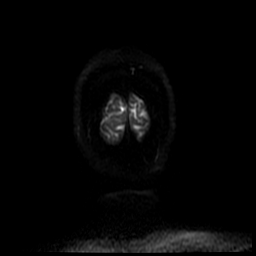

[Series 5: FLAIR · axial · 5.0mm · 0.43mm/px · z∈[-40,+97]mm · 3 of 24 slices shown]
[im 1/24]
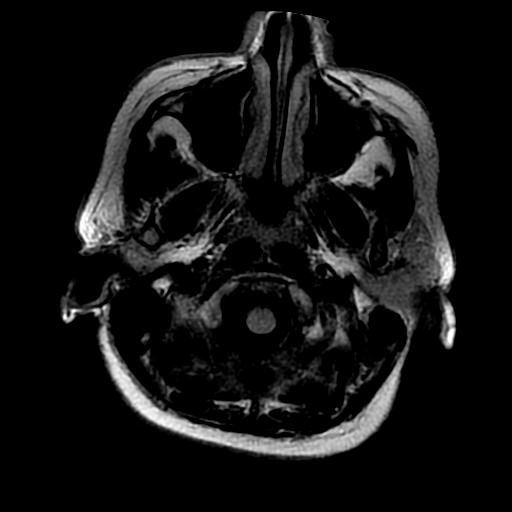
[im 12/24]
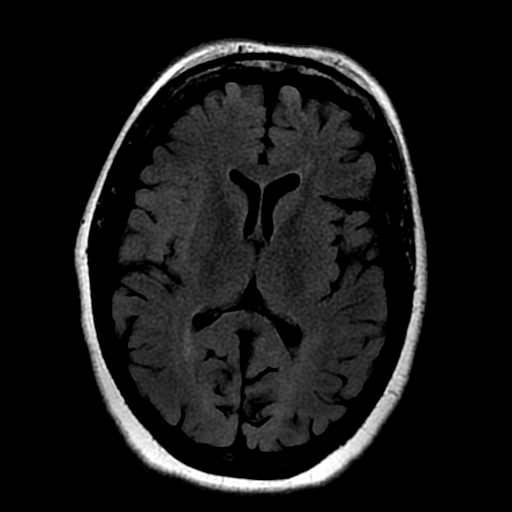
[im 24/24]
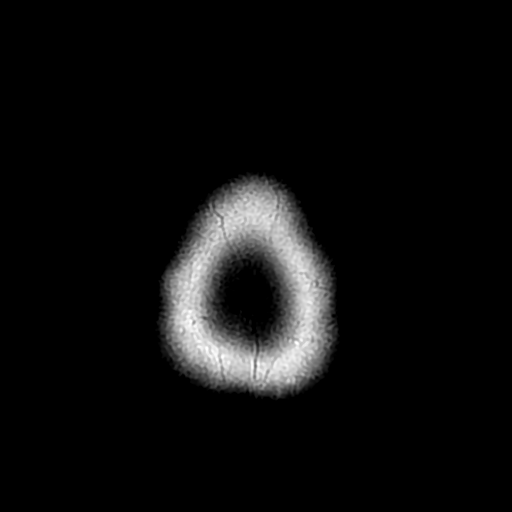

[Series 6: T2 · axial · 5.0mm · 0.43mm/px · z∈[-40,+97]mm · 3 of 24 slices shown (1 of 2)]
[im 1/24]
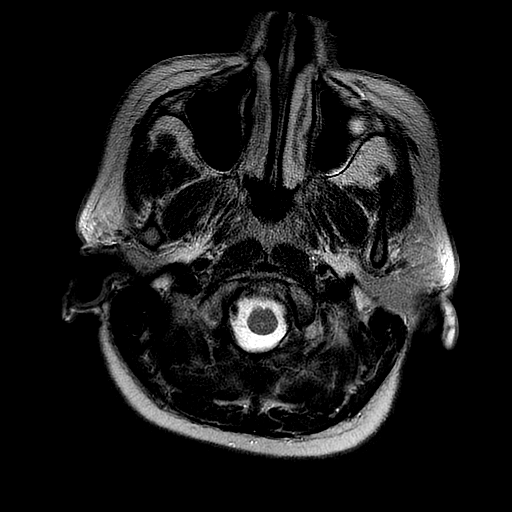
[im 12/24]
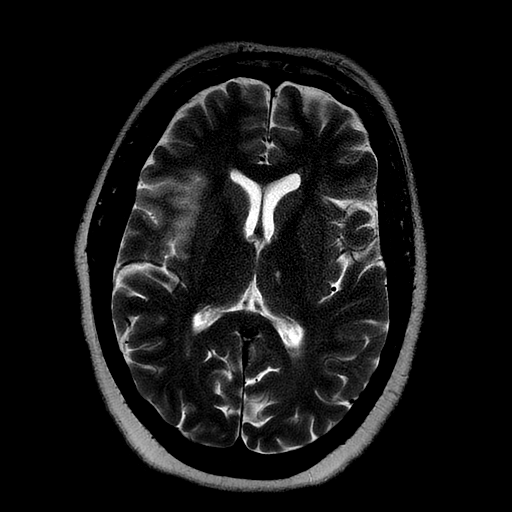
[im 24/24]
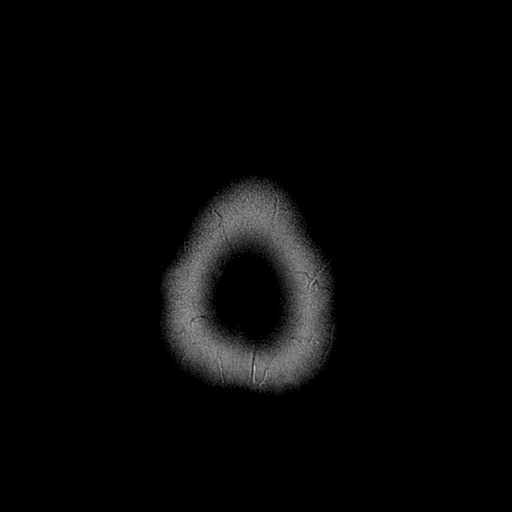

[Series 7: ax mpgr · axial · 5.0mm · 0.43mm/px · 1 of 24 slices shown]
[im 1/24]
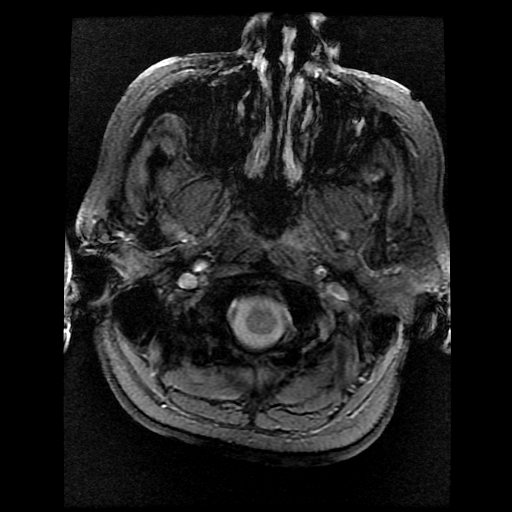

[Series 8: T1 · sagittal · 5.0mm · 0.47mm/px · 2 of 23 slices shown]
[im 1/23]
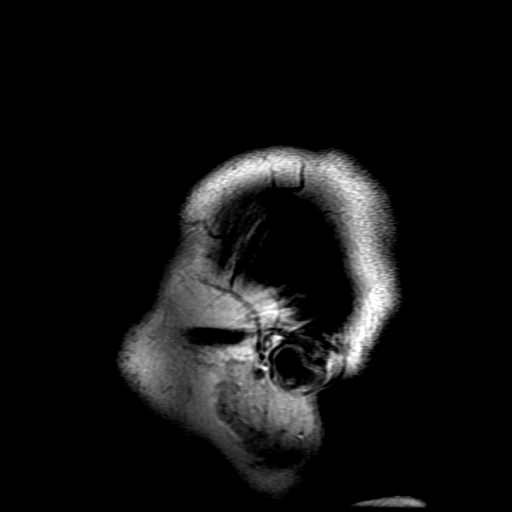
[im 23/23]
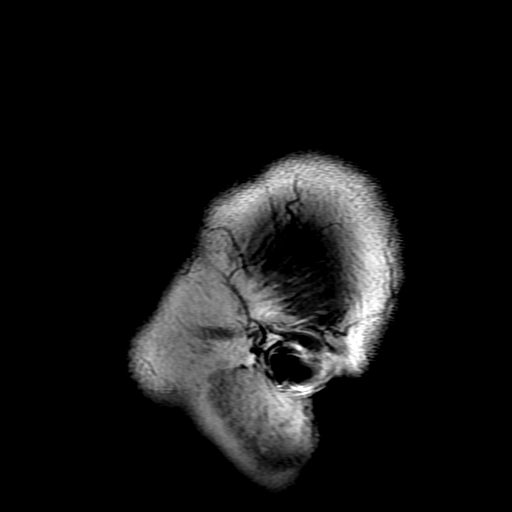

[Series 11: T2 · coronal · 5.0mm · 0.43mm/px · 3 of 28 slices shown (2 of 2)]
[im 1/28]
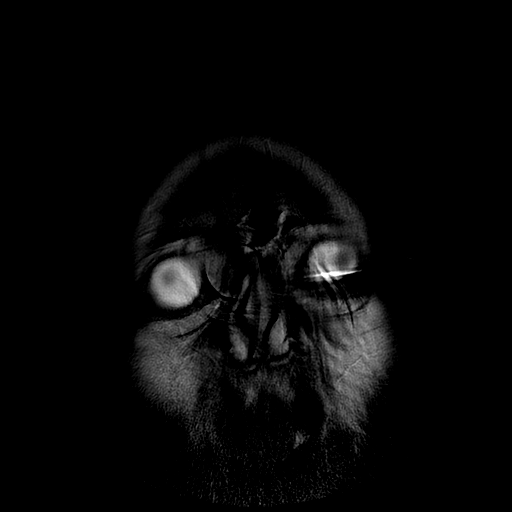
[im 14/28]
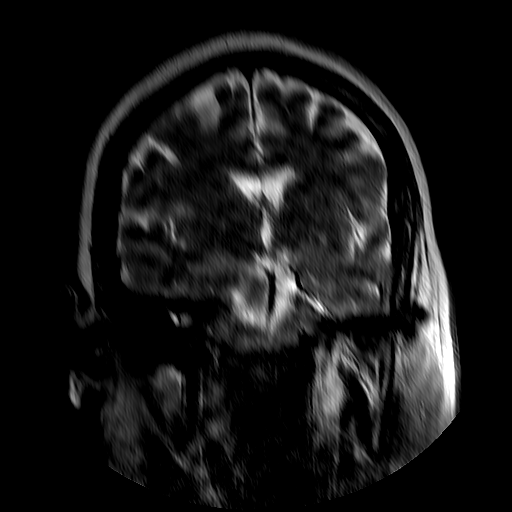
[im 28/28]
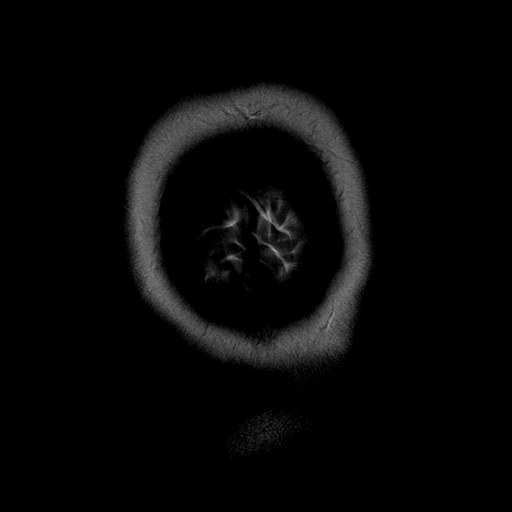

[Series 300: DWI · axial · 3.0mm · 1.09mm/px · z∈[-43,+94]mm · 5 of 47 slices shown (3 of 4)]
[im 1/47]
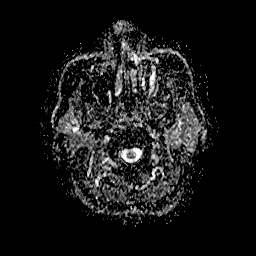
[im 12/47]
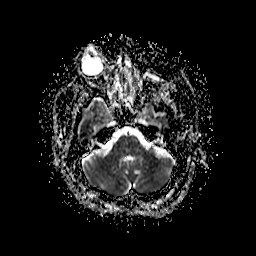
[im 24/47]
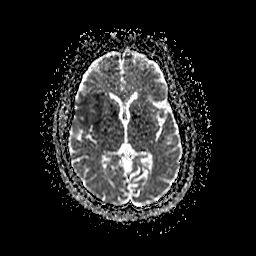
[im 35/47]
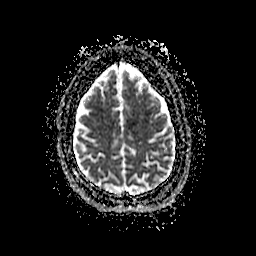
[im 47/47]
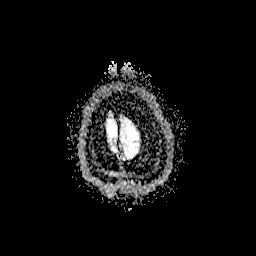

[Series 400: DWI · coronal · 5.0mm · 1.09mm/px · 3 of 33 slices shown (4 of 4)]
[im 1/33]
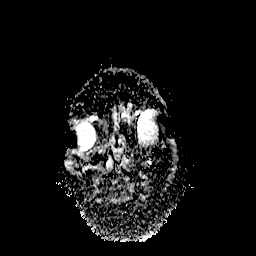
[im 17/33]
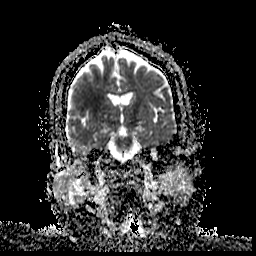
[im 33/33]
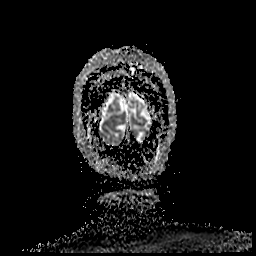

[36 of 48 positions shown; findings below may reference images not displayed]

FINDINGS: Brain: Right lateral frontal lobe and insula region of reduced
diffusion measuring 4.0 x 3.2 x 4.8 cm (volume = 32 cm^3)
compatible with acute/early subacute infarction similar in
distribution to prior CT of head given differences in technique. No
associated hemorrhage. No mass effect.

No additional evidence for acute infarction, hemorrhage, or mass
effect. No extra-axial collection, hydrocephalus, or effacement of
basilar cisterns.

Vascular: Persistent central flow voids.

Skull and upper cervical spine: Normal marrow signal.

Sinuses/Orbits: Diffuse paranasal sinus mucosal thickening greatest
in left frontal and sphenoid sinuses. No significant abnormal signal
of mastoid air cells. Orbits are unremarkable.

Other: None.
IMPRESSION: 1. Right lateral frontal lobe and insula acute/early subacute
infarction, approximately 32 cc, similar in distribution to prior CT
of head given differences in technique. No hemorrhage or mass
effect.
2. Otherwise unremarkable MRI of the brain.

By: Irambona Mugisha Amylly M.D.

## 2018-12-23 DIAGNOSIS — Z202 Contact with and (suspected) exposure to infections with a predominantly sexual mode of transmission: Secondary | ICD-10-CM | POA: Diagnosis not present

## 2018-12-23 DIAGNOSIS — N952 Postmenopausal atrophic vaginitis: Secondary | ICD-10-CM | POA: Diagnosis not present

## 2018-12-23 DIAGNOSIS — Z113 Encounter for screening for infections with a predominantly sexual mode of transmission: Secondary | ICD-10-CM | POA: Diagnosis not present

## 2019-06-01 DIAGNOSIS — Z20828 Contact with and (suspected) exposure to other viral communicable diseases: Secondary | ICD-10-CM | POA: Diagnosis not present

## 2019-06-10 ENCOUNTER — Encounter (HOSPITAL_COMMUNITY): Payer: Self-pay | Admitting: Emergency Medicine

## 2019-06-10 ENCOUNTER — Emergency Department (HOSPITAL_COMMUNITY)
Admission: EM | Admit: 2019-06-10 | Discharge: 2019-06-11 | Disposition: A | Discharge status: Home / Self Care | Payer: Medicare HMO | Attending: Emergency Medicine | Admitting: Emergency Medicine

## 2019-06-10 DIAGNOSIS — I10 Essential (primary) hypertension: Secondary | ICD-10-CM | POA: Insufficient documentation

## 2019-06-10 DIAGNOSIS — I2510 Atherosclerotic heart disease of native coronary artery without angina pectoris: Secondary | ICD-10-CM | POA: Insufficient documentation

## 2019-06-10 DIAGNOSIS — J069 Acute upper respiratory infection, unspecified: Secondary | ICD-10-CM | POA: Diagnosis not present

## 2019-06-10 DIAGNOSIS — R112 Nausea with vomiting, unspecified: Secondary | ICD-10-CM | POA: Insufficient documentation

## 2019-06-10 DIAGNOSIS — R42 Dizziness and giddiness: Secondary | ICD-10-CM | POA: Insufficient documentation

## 2019-06-10 DIAGNOSIS — Z79899 Other long term (current) drug therapy: Secondary | ICD-10-CM | POA: Insufficient documentation

## 2019-06-10 DIAGNOSIS — R079 Chest pain, unspecified: Secondary | ICD-10-CM | POA: Diagnosis not present

## 2019-06-10 DIAGNOSIS — Z20822 Contact with and (suspected) exposure to covid-19: Secondary | ICD-10-CM | POA: Insufficient documentation

## 2019-06-10 DIAGNOSIS — Z87891 Personal history of nicotine dependence: Secondary | ICD-10-CM | POA: Insufficient documentation

## 2019-06-10 DIAGNOSIS — R05 Cough: Secondary | ICD-10-CM | POA: Diagnosis not present

## 2019-06-10 LAB — I-STAT BETA HCG BLOOD, ED (MC, WL, AP ONLY): I-stat hCG, quantitative: <5 m[IU]/mL (ref <5)

## 2019-06-10 MED ORDER — SODIUM CHLORIDE 0.9% FLUSH
3.0000 mL | Freq: Once | INTRAVENOUS | Status: AC
  Administered 2019-06-11: 05:00:00 3 mL via INTRAVENOUS

## 2019-06-10 NOTE — ED Triage Notes (Signed)
Pt reports she has had lightheadedness x several days with palpitations this morning, relieved with nitro x 1. Pt reports tonight she fell from dizziness, 20+ episodes of n/v/d in last 24 hours. Prior MI. Pt reports cough and shob, neg COVID Tuesday.

## 2019-06-11 ENCOUNTER — Emergency Department (HOSPITAL_COMMUNITY): Payer: Medicare HMO

## 2019-06-11 DIAGNOSIS — R112 Nausea with vomiting, unspecified: Secondary | ICD-10-CM | POA: Diagnosis not present

## 2019-06-11 DIAGNOSIS — R079 Chest pain, unspecified: Secondary | ICD-10-CM | POA: Diagnosis not present

## 2019-06-11 DIAGNOSIS — R05 Cough: Secondary | ICD-10-CM | POA: Diagnosis not present

## 2019-06-11 DIAGNOSIS — Z20822 Contact with and (suspected) exposure to covid-19: Secondary | ICD-10-CM | POA: Diagnosis not present

## 2019-06-11 DIAGNOSIS — J069 Acute upper respiratory infection, unspecified: Secondary | ICD-10-CM | POA: Diagnosis not present

## 2019-06-11 DIAGNOSIS — R42 Dizziness and giddiness: Secondary | ICD-10-CM | POA: Diagnosis not present

## 2019-06-11 LAB — URINALYSIS, ROUTINE W REFLEX MICROSCOPIC
Bilirubin Urine: NEGATIVE
Glucose, UA: NEGATIVE mg/dL
Hgb urine dipstick: NEGATIVE
Ketones, ur: NEGATIVE mg/dL
Nitrite: NEGATIVE
Protein, ur: NEGATIVE mg/dL
Specific Gravity, Urine: 1.005 (ref 1.005–1.030)
pH: 6 (ref 5.0–8.0)

## 2019-06-11 LAB — CBC
HCT: 44.5 % (ref 36.0–46.0)
Hemoglobin: 14.7 g/dL (ref 12.0–15.0)
MCH: 31.7 pg (ref 26.0–34.0)
MCHC: 33 g/dL (ref 30.0–36.0)
MCV: 95.9 fL (ref 80.0–100.0)
Platelets: 269 10*3/uL (ref 150–400)
RBC: 4.64 MIL/uL (ref 3.87–5.11)
RDW: 14.9 % (ref 11.5–15.5)
WBC: 11.1 10*3/uL — ABNORMAL HIGH (ref 4.0–10.5)
nRBC: 0 % (ref 0.0–0.2)

## 2019-06-11 LAB — BASIC METABOLIC PANEL
Anion gap: 13 (ref 5–15)
BUN: <5 mg/dL — ABNORMAL LOW (ref 6–20)
CO2: 25 mmol/L (ref 22–32)
Calcium: 9.7 mg/dL (ref 8.9–10.3)
Chloride: 103 mmol/L (ref 98–111)
Creatinine, Ser: 0.64 mg/dL (ref 0.44–1.00)
GFR calc Af Amer: >60 mL/min (ref >60)
GFR calc non Af Amer: >60 mL/min (ref >60)
Glucose, Bld: 120 mg/dL — ABNORMAL HIGH (ref 70–99)
Potassium: 3.9 mmol/L (ref 3.5–5.1)
Sodium: 141 mmol/L (ref 135–145)

## 2019-06-11 LAB — TROPONIN I (HIGH SENSITIVITY)
Troponin I (High Sensitivity): 6 ng/L (ref <18)
Troponin I (High Sensitivity): 6 ng/L (ref <18)

## 2019-06-11 LAB — SARS CORONAVIRUS 2 (TAT 6-24 HRS): SARS Coronavirus 2: NEGATIVE

## 2019-06-11 MED ORDER — SODIUM CHLORIDE 0.9 % IV BOLUS (SEPSIS)
1000.0000 mL | Freq: Once | INTRAVENOUS | Status: AC
  Administered 2019-06-11: 05:00:00 1000 mL via INTRAVENOUS

## 2019-06-11 MED ORDER — SODIUM CHLORIDE 0.9 % IV BOLUS
1000.0000 mL | Freq: Once | INTRAVENOUS | Status: AC
  Administered 2019-06-11: 07:00:00 1000 mL via INTRAVENOUS

## 2019-06-11 NOTE — Discharge Instructions (Addendum)
Your labs and exam are very reassuring. Your COVID test result will be available in MyChart when it results.   Continue a normal diet and taking your regular medications. Follow up with your doctor for recheck next week if no completely better.

## 2019-06-11 NOTE — ED Notes (Signed)
Patient given ginger ale. 

## 2019-06-11 NOTE — ED Provider Notes (Signed)
[facility] EMERGENCY DEPARTMENT Provider Note   CSN: [id number] Arrival date & time: 06/10/19  2306     History Chief Complaint  Patient presents with  . Dizziness    Lindsey [name] is a 49 y.o. female.  Patient with a history of CAD/MI, CVA, anemia requiring transfusion, antithrombin III deficient, HTN, HLD, GERD, pancreatitis, medication noncompliance presents with symptoms of cough, SOB, chest tightness, nausea, vomiting, chills x 4 days. She reports similar symptoms last week that prompted her to take a COVID test that was negative. She has several days of feeling well before symptoms recurred. She reports feeling dizzy/lightheaded over the last 1 day that is present at rest but worse with ambulation. Last night she fell secondary to these symptoms. No injury. No sick contacts.   The history is provided by the patient. No language interpreter was used.       Past Medical History:  Diagnosis Date  . Anemia   . Antithrombin III deficiency (HCC)    ?pt not sure if true diagnosis  . Anxiety   . Apical mural thrombus   . Blood transfusion   . Cholecystitis 07/2016  . Coronary artery disease    a. apical LAD infarction '00. b. NSTEMI s/p BMS to prox LAD '09. c. Cath 01/2015: stable LAD stent, otherwise minimal nonobstructive CAD.  Marland Kitchen GERD (gastroesophageal reflux disease)   . Hyperlipidemia   . Hypertension   . Myocardial infarction (HCC)   . Noncompliance with medication regimen    a. h/o noncompliance with med regimen (previous running out of Coumadin).  . Pancreatitis   . Pancreatitis, acute   . 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 Stroke (HCC)    assoc with short term memory loss and right peripheral vision loss; age 66   . TIA (transient ischemic attack) 2010  . Tobacco abuse     Patient Active Problem List   Diagnosis Date Noted  . Acute pancreatitis 11/04/2018  . Abdominal pain 11/03/2018  . Pancreatitis, acute   . Chronic left shoulder pain  11/27/2017  . PMB (postmenopausal bleeding) 09/23/2017  . Acute blood loss anemia   . Vascular headache   . Hemiparesis affecting left side as late effect of stroke (HCC)   . Tobacco use disorder 07/13/2017  . Noncompliance with medication regimen 07/13/2017  . Acute ischemic right MCA stroke (HCC) 07/13/2017  . Benign essential HTN   . Stroke (cerebrum) (HCC) 07/08/2017  . Middle cerebral artery embolism, right 07/08/2017  . Vitamin B12 deficiency 08/15/2016  . Acute cholecystitis 08/14/2016  . GERD (gastroesophageal reflux disease) 04/24/2015  . Hypertensive urgency 04/23/2015  . Chest pain, unspecified   . Right leg swelling 03/07/2015  . Preventative health care 02/21/2015  . Esophageal reflux   . NSTEMI (non-ST elevated myocardial infarction) (HCC) 01/28/2015  . Nausea without vomiting 12/22/2014  . Dysuria 12/21/2014  . Diarrhea 09/26/2014  . Difficulty swallowing pills 09/26/2014  . Hemorrhoids 06/16/2014  . Elevated AST (SGOT) 03/27/2014  . Lumbar herniated disc 03/27/2014  . TIA (transient ischemic attack) 02/19/2014  . History of ischemic left PCA stroke 02/19/2014  . Palpitations 01/27/2014  . Depression 08/15/2013  . Long term current use of anticoagulant therapy 08/10/2013  . Gastritis and duodenitis 08/03/2013  . Diverticulosis 08/03/2013  . Fatty liver 08/03/2013  . Esophagitis 08/03/2013  . Hiatal hernia 08/03/2013  . Apical mural thrombus 08/02/2013  . GI bleeding 06/04/2012  . Hypokalemia 06/04/2012  . Antithrombin III deficiency (HCC) 06/04/2012  .  Paresthesias 06/29/2011  . Hyperlipemia 06/07/2009  . Essential hypertension 05/17/2009  . OBESITY-MORBID (>100') 03/13/2008  . CAD S/P  BMS to proximal LAD 2009-patent 01/29/15 06/28/2007    Past Surgical History:  Procedure Laterality Date  . CARDIAC CATHETERIZATION    . CARDIAC CATHETERIZATION N/A 01/29/2015   Procedure: Left Heart Cath and Coronary Angiography;  Surgeon: Peter M Martinique, MD;   Location: [facility];  Service: Cardiovascular;  Laterality: N/A;  . CHOLECYSTECTOMY N/A 08/14/2016   Procedure: LAPAROSCOPIC CHOLECYSTECTOMY;  Surgeon: Greer Pickerel, MD;  Location: [facility];  Service: General;  Laterality: N/A;  . COLONOSCOPY N/A 03/29/2014   Procedure: COLONOSCOPY;  Surgeon: Inda Castle, MD;  Location: [facility];  Service: Endoscopy;  Laterality: N/A;  . CORONARY ANGIOPLASTY    . ESOPHAGOGASTRODUODENOSCOPY N/A 06/09/2012   Procedure: ESOPHAGOGASTRODUODENOSCOPY (EGD);  Surgeon: Beryle Beams, MD;  Location: [facility];  Service: Endoscopy;  Laterality: N/A;  . ESOPHAGOGASTRODUODENOSCOPY N/A 08/02/2013   Procedure: ESOPHAGOGASTRODUODENOSCOPY (EGD);  Surgeon: Ladene Artist, MD;  Location: [facility];  Service: Endoscopy;  Laterality: N/A;  . IR ANGIO INTRA EXTRACRAN SEL COM CAROTID INNOMINATE UNI R MOD SED  07/08/2017  . LEFT HEART CATHETERIZATION WITH CORONARY ANGIOGRAM N/A 12/24/2011   Procedure: LEFT HEART CATHETERIZATION WITH CORONARY ANGIOGRAM;  Surgeon: Burnell Blanks, MD;  Location: [facility];  Service: Cardiovascular;  Laterality: N/A;  . RADIOLOGY WITH ANESTHESIA N/A 07/08/2017   Procedure: IR WITH ANESTHESIA;  Surgeon: Radiologist, Medication, MD;  Location: [facility];  Service: Radiology;  Laterality: N/A;  . TUBAL LIGATION       OB History    Gravida  2   Para  2   Term  2   Preterm      AB      Living        SAB      TAB      Ectopic      Multiple      Live Births              Family History  Problem Relation Age of Onset  . Stroke Father   . Heart disease Brother        arrhythmia; died  . Breast cancer Maternal Aunt     Social History   Tobacco Use  . Smoking status: Former Smoker    Packs/day: 0.20    Years: 1.00    Pack years: 0.20    Types: Cigarettes    Quit date: 10/27/2018    Years since quitting: 0.6  . Smokeless tobacco: Never Used  . Tobacco comment: 2018  " i STILL SMOKE 3  CIGARETTES A DAY "    Substance Use Topics  . Alcohol use: Yes    Alcohol/week: 0.0 standard drinks    Comment: Social drinker  . Drug use: Not Currently    Types: Cocaine    Comment: hx of use x 1 in the past    Home Medications Prior to Admission medications   Medication Sig Start Date End Date Taking? Authorizing Provider  acetaminophen (TYLENOL) 325 MG tablet Take 1-2 tablets (325-650 mg total) by mouth every 4 (four) hours as needed for mild pain. 07/14/17   Love, Ivan Anchors, PA-C  amLODipine (NORVASC) 10 MG tablet Take 1 tablet (10 mg total) by mouth daily. 11/04/18   Black, Lezlie Octave, NP  atorvastatin (LIPITOR) 40 MG tablet Take 1 tablet (40 mg total) by mouth daily. 11/04/18   Black, Lezlie Octave,  NP  carvedilol (COREG) 25 MG tablet Take 1 tablet (25 mg total) by mouth 2 (two) times daily with a meal. 11/04/18   Black, Lesle Chris, NP  clopidogrel (PLAVIX) 75 MG tablet Take 1 tablet (75 mg total) by mouth daily. 11/04/18   Black, Lesle Chris, NP  hydrochlorothiazide (HYDRODIURIL) 25 MG tablet Take 0.5 tablets (12.5 mg total) by mouth daily. 11/04/18   Black, Lesle Chris, NP  nitroGLYCERIN (NITROSTAT) 0.4 MG SL tablet Place 1 tablet (0.4 mg total) under the tongue every 5 (five) minutes as needed for chest pain (up to 3 doses). 08/16/16   Rodolph Bong, MD  oxyCODONE-acetaminophen (PERCOCET/ROXICET) 5-325 MG tablet Take 1-2 tablets by mouth every 6 (six) hours as needed for moderate pain. 11/04/18   Black, Lesle Chris, NP  pantoprazole (PROTONIX) 40 MG tablet Take 1 tablet (40 mg total) by mouth 2 (two) times daily. 11/04/18   Black, Lesle Chris, NP  rivaroxaban (XARELTO) 20 MG TABS tablet Take 1 tablet (20 mg total) by mouth daily. 11/04/18   Black, Lesle Chris, NP  sucralfate (CARAFATE) 1 g tablet Take 1 tablet (1 g total) by mouth 4 (four) times daily. 11/04/18 11/04/19  Hughie Closs, MD    Allergies    Patient has no known allergies.  Review of Systems   Review of Systems  Constitutional: Positive for chills. Negative for diaphoresis.   HENT: Negative.   Respiratory: Positive for cough, chest tightness and shortness of breath.   Cardiovascular: Negative.  Negative for leg swelling.  Gastrointestinal: Positive for diarrhea, nausea and vomiting. Negative for abdominal pain and blood in stool.  Genitourinary: Negative.  Negative for dysuria and frequency.  Musculoskeletal: Positive for myalgias.  Skin: Negative.   Neurological: Positive for dizziness and light-headedness. Negative for syncope.    Physical Exam Updated Vital Signs BP (!) 132/92   Pulse 70   Temp (!) 97.3 F (36.3 C)   Resp 15   SpO2 97%   Physical Exam Vitals and nursing note reviewed.  Constitutional:      Appearance: She is well-developed.  HENT:     Head: Normocephalic.  Cardiovascular:     Rate and Rhythm: Normal rate and regular rhythm.     Heart sounds: No murmur.  Pulmonary:     Effort: Pulmonary effort is normal.     Breath sounds: Normal breath sounds. No wheezing, rhonchi or rales.  Abdominal:     General: Bowel sounds are normal.     Palpations: Abdomen is soft.     Tenderness: There is no abdominal tenderness. There is no guarding or rebound.  Musculoskeletal:        General: Normal range of motion.     Cervical back: Normal range of motion and neck supple.     Right lower leg: No edema.     Left lower leg: No edema.  Skin:    General: Skin is warm and dry.     Findings: No rash.  Neurological:     Mental Status: She is alert and oriented to person, place, and time.     ED Results / Procedures / Treatments   Labs (all labs ordered are listed, but only abnormal results are displayed) Labs Reviewed  BASIC METABOLIC PANEL - Abnormal; Notable for the following components:      Result Value   Glucose, Bld 120 (*)    BUN <5 (*)    All other components within normal limits  CBC - Abnormal; Notable for the  following components:   WBC 11.1 (*)    All other components within normal limits  URINALYSIS, ROUTINE W REFLEX  MICROSCOPIC - Abnormal; Notable for the following components:   APPearance HAZY (*)    Leukocytes,Ua MODERATE (*)    Bacteria, UA RARE (*)    All other components within normal limits  I-STAT BETA HCG BLOOD, ED ([facility], [facility], [facility] ONLY)  TROPONIN I (HIGH SENSITIVITY)  TROPONIN I (HIGH SENSITIVITY)    EKG EKG Interpretation  Date/Time:  Friday June 10 2019 23:14:30 EDT Ventricular Rate:  92 PR Interval:  118 QRS Duration: 76 QT Interval:  360 QTC Calculation: 445 R Axis:   82 Text Interpretation: Normal sinus rhythm Low voltage QRS Cannot rule out Anteroseptal infarct , age undetermined Abnormal ECG No significant change since last tracing Confirmed by Rochele Raring [phone]) on 06/11/2019 4:24:32 AM   Radiology DG Chest Portable 1 View  Result Date: 06/11/2019 CLINICAL DATA:  Cough and chest pain EXAM: PORTABLE CHEST 1 VIEW COMPARISON:  July 08, 2017 FINDINGS: The heart size and mediastinal contours are within normal limits. Both lungs are clear. The visualized skeletal structures are unremarkable. IMPRESSION: No active disease. Electronically Signed   By: Jonna Clark M.D.   On: 06/11/2019 00:19    Procedures Procedures (including critical care time)  Medications Ordered in ED Medications  sodium chloride flush (NS) 0.9 % injection 3 mL (3 mLs Intravenous Given 06/11/19 0500)  sodium chloride 0.9 % bolus 1,000 mL (1,000 mLs Intravenous New Bag/Given 06/11/19 0500)    ED Course  I have reviewed the triage vital signs and the nursing notes.  Pertinent labs & imaging results that were available during my care of the patient were reviewed by me and considered in my medical decision making (see chart for details).    MDM Rules/Calculators/A&P                      Patient to ED with symptoms as outlined in HPI.   She reports ss/sxs of respiratory illness with chest tightness (different from MI, constant), SOB (cough with deep breath), cough, nausea/vomiting/diarrhea. She does report  being able to eat and drink at home.   She is overall well appearing. EKG without ischemic changes, CXR clear, labs are essentially unremarkable. She has received a liter of fluids and reports feeling a little better. Orthostatic VS were negative. Symptoms atypical for ACS. No evidence of PNA, acute neurologic event. No vomiting in the ED. She does not appear dehydrated. She is tolerating PO fluids without worsening symptoms or vomiting.   Feel she can be discharged home. She is encouraged to continue home medications and follow up with her doctor Monday for recheck. Will collect COVID for send out testing.     Final Clinical Impression(s) / ED Diagnoses Final diagnoses:  None   1. Nausea, vomiting 2. Dizziness 3. URI  Rx / DC Orders ED Discharge Orders    None       Elpidio Anis, Cordelia Poche 06/11/19 [id number]    Ward, Layla Maw, DO 06/11/19 [phone]

## 2019-07-16 ENCOUNTER — Other Ambulatory Visit: Payer: Self-pay

## 2019-07-16 ENCOUNTER — Encounter (HOSPITAL_COMMUNITY): Payer: Self-pay | Admitting: Emergency Medicine

## 2019-07-16 ENCOUNTER — Emergency Department (HOSPITAL_COMMUNITY): Payer: Medicare HMO

## 2019-07-16 ENCOUNTER — Emergency Department (HOSPITAL_COMMUNITY)
Admission: EM | Admit: 2019-07-16 | Discharge: 2019-07-17 | Disposition: A | Discharge status: Home / Self Care | Payer: Medicare HMO | Attending: Emergency Medicine | Admitting: Emergency Medicine

## 2019-07-16 DIAGNOSIS — I10 Essential (primary) hypertension: Secondary | ICD-10-CM | POA: Diagnosis not present

## 2019-07-16 DIAGNOSIS — Z87891 Personal history of nicotine dependence: Secondary | ICD-10-CM | POA: Diagnosis not present

## 2019-07-16 DIAGNOSIS — R079 Chest pain, unspecified: Secondary | ICD-10-CM | POA: Diagnosis not present

## 2019-07-16 DIAGNOSIS — R0602 Shortness of breath: Secondary | ICD-10-CM | POA: Insufficient documentation

## 2019-07-16 DIAGNOSIS — I2510 Atherosclerotic heart disease of native coronary artery without angina pectoris: Secondary | ICD-10-CM | POA: Diagnosis not present

## 2019-07-16 DIAGNOSIS — I639 Cerebral infarction, unspecified: Secondary | ICD-10-CM | POA: Diagnosis not present

## 2019-07-16 DIAGNOSIS — I252 Old myocardial infarction: Secondary | ICD-10-CM | POA: Insufficient documentation

## 2019-07-16 DIAGNOSIS — R0600 Dyspnea, unspecified: Secondary | ICD-10-CM | POA: Diagnosis not present

## 2019-07-16 DIAGNOSIS — Z955 Presence of coronary angioplasty implant and graft: Secondary | ICD-10-CM | POA: Insufficient documentation

## 2019-07-16 DIAGNOSIS — R0789 Other chest pain: Secondary | ICD-10-CM | POA: Diagnosis not present

## 2019-07-16 LAB — BASIC METABOLIC PANEL
Anion gap: 14 (ref 5–15)
BUN: 5 mg/dL — ABNORMAL LOW (ref 6–20)
CO2: 25 mmol/L (ref 22–32)
Calcium: 8.5 mg/dL — ABNORMAL LOW (ref 8.9–10.3)
Chloride: 98 mmol/L (ref 98–111)
Creatinine, Ser: 0.88 mg/dL (ref 0.44–1.00)
GFR calc Af Amer: >60 mL/min (ref >60)
GFR calc non Af Amer: >60 mL/min (ref >60)
Glucose, Bld: 150 mg/dL — ABNORMAL HIGH (ref 70–99)
Potassium: 3 mmol/L — ABNORMAL LOW (ref 3.5–5.1)
Sodium: 137 mmol/L (ref 135–145)

## 2019-07-16 LAB — I-STAT BETA HCG BLOOD, ED (MC, WL, AP ONLY): I-stat hCG, quantitative: <5 m[IU]/mL (ref <5)

## 2019-07-16 LAB — TROPONIN I (HIGH SENSITIVITY)
Troponin I (High Sensitivity): 5 ng/L (ref <18)
Troponin I (High Sensitivity): 5 ng/L (ref <18)

## 2019-07-16 LAB — PROTIME-INR
INR: 1 (ref 0.8–1.2)
Prothrombin Time: 12.9 seconds (ref 11.4–15.2)

## 2019-07-16 LAB — CBC
HCT: 41.6 % (ref 36.0–46.0)
Hemoglobin: 13.7 g/dL (ref 12.0–15.0)
MCH: 31.4 pg (ref 26.0–34.0)
MCHC: 32.9 g/dL (ref 30.0–36.0)
MCV: 95.2 fL (ref 80.0–100.0)
Platelets: 203 10*3/uL (ref 150–400)
RBC: 4.37 MIL/uL (ref 3.87–5.11)
RDW: 14.6 % (ref 11.5–15.5)
WBC: 8.9 10*3/uL (ref 4.0–10.5)
nRBC: 0 % (ref 0.0–0.2)

## 2019-07-16 MED ORDER — SODIUM CHLORIDE 0.9% FLUSH
3.0000 mL | Freq: Once | INTRAVENOUS | Status: AC
  Administered 2019-07-17: 3 mL via INTRAVENOUS

## 2019-07-16 NOTE — ED Triage Notes (Signed)
Patient reports intermittent central chest pain for 2 weeks with SOB and emesis , denies fever or diaphoresis . History of CAD/Coronary stent , her cardiologist is Dr. Jens Som.

## 2019-07-17 ENCOUNTER — Emergency Department (HOSPITAL_COMMUNITY): Payer: Medicare HMO

## 2019-07-17 DIAGNOSIS — R0600 Dyspnea, unspecified: Secondary | ICD-10-CM | POA: Diagnosis not present

## 2019-07-17 DIAGNOSIS — R079 Chest pain, unspecified: Secondary | ICD-10-CM | POA: Diagnosis not present

## 2019-07-17 MED ORDER — POTASSIUM CHLORIDE CRYS ER 20 MEQ PO TBCR
40.0000 meq | EXTENDED_RELEASE_TABLET | Freq: Once | ORAL | Status: AC
  Administered 2019-07-17: 40 meq via ORAL
  Filled 2019-07-17: qty 2

## 2019-07-17 MED ORDER — AZITHROMYCIN 250 MG PO TABS: ORAL_TABLET | ORAL | 0 refills | Status: DC

## 2019-07-17 MED ORDER — ALBUTEROL SULFATE HFA 108 (90 BASE) MCG/ACT IN AERS
2.0000 | INHALATION_SPRAY | Freq: Once | RESPIRATORY_TRACT | Status: AC
  Administered 2019-07-17: 2 via RESPIRATORY_TRACT
  Filled 2019-07-17: qty 6.7

## 2019-07-17 MED ORDER — PREDNISONE 20 MG PO TABS: ORAL_TABLET | ORAL | 0 refills | Status: DC

## 2019-07-17 MED ORDER — HYDROCODONE-HOMATROPINE 5-1.5 MG/5ML PO SYRP: 5.0000 mL | ORAL_SOLUTION | Freq: Four times a day (QID) | ORAL | 0 refills | Status: DC | PRN

## 2019-07-17 MED ORDER — HYDROCODONE-HOMATROPINE 5-1.5 MG/5ML PO SYRP
5.0000 mL | ORAL_SOLUTION | Freq: Once | ORAL | Status: AC
  Administered 2019-07-17: 5 mL via ORAL
  Filled 2019-07-17: qty 5

## 2019-07-17 MED ORDER — IOHEXOL 350 MG/ML SOLN
100.0000 mL | Freq: Once | INTRAVENOUS | Status: AC | PRN
  Administered 2019-07-17: 100 mL via INTRAVENOUS

## 2019-07-17 MED ORDER — BENZONATATE 100 MG PO CAPS
100.0000 mg | ORAL_CAPSULE | Freq: Once | ORAL | Status: AC
  Administered 2019-07-17: 100 mg via ORAL
  Filled 2019-07-17: qty 1

## 2019-07-17 NOTE — ED Provider Notes (Signed)
Emergency Department Provider Note  I have reviewed the triage vital signs and the nursing notes.  HISTORY  Chief Complaint Chest Pain   HPI Lindsey [name] is a 49 y.o. female with multimedical problems as documented below presents to the emerge department today with chest pain.  Patient states that she has smoked for many years and recently she is trying to quit smoking and she is been doing about 2 to 3 weeks now.  She states that she has had some cough during this time and she has some pleuritic chest pain related to the cough.  She is also short of breath little bit more than she is used to and she states that sometimes this is worse when she lays flat.  She has a history of stroke and coronary artery disease and her history also states that she has Antithrombin III deficiency.  No lower extremity swelling.  She also states that she feels like she has got some postnasal drip.   No other associated or modifying symptoms.    Past Medical History:  Diagnosis Date  . Anemia   . Antithrombin III deficiency (HCC)    ?pt not sure if true diagnosis  . Anxiety   . Apical mural thrombus   . Blood transfusion   . Cholecystitis 07/2016  . Coronary artery disease    a. apical LAD infarction '00. b. NSTEMI s/p BMS to prox LAD '09. c. Cath 01/2015: stable LAD stent, otherwise minimal nonobstructive CAD.  Marland Kitchen GERD (gastroesophageal reflux disease)   . Hyperlipidemia   . Hypertension   . Myocardial infarction (HCC)   . Noncompliance with medication regimen    a. h/o noncompliance with med regimen (previous running out of Coumadin).  . Pancreatitis   . Pancreatitis, acute   . 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 Stroke (HCC)    assoc with short term memory loss and right peripheral vision loss; age 38   . TIA (transient ischemic attack) 2010  . Tobacco abuse     Patient Active Problem List   Diagnosis Date Noted  . Acute pancreatitis 11/04/2018  . Abdominal pain 11/03/2018  .  Pancreatitis, acute   . Chronic left shoulder pain 11/27/2017  . PMB (postmenopausal bleeding) 09/23/2017  . Acute blood loss anemia   . Vascular headache   . Hemiparesis affecting left side as late effect of stroke (HCC)   . Tobacco use disorder 07/13/2017  . Noncompliance with medication regimen 07/13/2017  . Acute ischemic right MCA stroke (HCC) 07/13/2017  . Benign essential HTN   . Stroke (cerebrum) (HCC) 07/08/2017  . Middle cerebral artery embolism, right 07/08/2017  . Vitamin B12 deficiency 08/15/2016  . Acute cholecystitis 08/14/2016  . GERD (gastroesophageal reflux disease) 04/24/2015  . Hypertensive urgency 04/23/2015  . Chest pain, unspecified   . Right leg swelling 03/07/2015  . Preventative health care 02/21/2015  . Esophageal reflux   . NSTEMI (non-ST elevated myocardial infarction) (HCC) 01/28/2015  . Nausea without vomiting 12/22/2014  . Dysuria 12/21/2014  . Diarrhea 09/26/2014  . Difficulty swallowing pills 09/26/2014  . Hemorrhoids 06/16/2014  . Elevated AST (SGOT) 03/27/2014  . Lumbar herniated disc 03/27/2014  . TIA (transient ischemic attack) 02/19/2014  . History of ischemic left PCA stroke 02/19/2014  . Palpitations 01/27/2014  . Depression 08/15/2013  . Long term current use of anticoagulant therapy 08/10/2013  . Gastritis and duodenitis 08/03/2013  . Diverticulosis 08/03/2013  . Fatty liver 08/03/2013  . Esophagitis 08/03/2013  . Hiatal  hernia 08/03/2013  . Apical mural thrombus 08/02/2013  . GI bleeding 06/04/2012  . Hypokalemia 06/04/2012  . Antithrombin III deficiency (HCC) 06/04/2012  . Paresthesias 06/29/2011  . Hyperlipemia 06/07/2009  . Essential hypertension 05/17/2009  . OBESITY-MORBID (>100') 03/13/2008  . CAD S/P  BMS to proximal LAD 2009-patent 01/29/15 06/28/2007    Past Surgical History:  Procedure Laterality Date  . CARDIAC CATHETERIZATION    . CARDIAC CATHETERIZATION N/A 01/29/2015   Procedure: Left Heart Cath and  Coronary Angiography;  Surgeon: Peter M Swaziland, MD;  Location: [facility] INVASIVE CV LAB;  Service: Cardiovascular;  Laterality: N/A;  . CHOLECYSTECTOMY N/A 08/14/2016   Procedure: LAPAROSCOPIC CHOLECYSTECTOMY;  Surgeon: Gaynelle Adu, MD;  Location: [facility] OR;  Service: General;  Laterality: N/A;  . COLONOSCOPY N/A 03/29/2014   Procedure: COLONOSCOPY;  Surgeon: Louis Meckel, MD;  Location: [facility] ENDOSCOPY;  Service: Endoscopy;  Laterality: N/A;  . CORONARY ANGIOPLASTY    . ESOPHAGOGASTRODUODENOSCOPY N/A 06/09/2012   Procedure: ESOPHAGOGASTRODUODENOSCOPY (EGD);  Surgeon: Theda Belfast, MD;  Location: [facility] ENDOSCOPY;  Service: Endoscopy;  Laterality: N/A;  . ESOPHAGOGASTRODUODENOSCOPY N/A 08/02/2013   Procedure: ESOPHAGOGASTRODUODENOSCOPY (EGD);  Surgeon: Meryl Dare, MD;  Location: [facility] ENDOSCOPY;  Service: Endoscopy;  Laterality: N/A;  . IR ANGIO INTRA EXTRACRAN SEL COM CAROTID INNOMINATE UNI R MOD SED  07/08/2017  . LEFT HEART CATHETERIZATION WITH CORONARY ANGIOGRAM N/A 12/24/2011   Procedure: LEFT HEART CATHETERIZATION WITH CORONARY ANGIOGRAM;  Surgeon: Kathleene Hazel, MD;  Location: [facility] CATH LAB;  Service: Cardiovascular;  Laterality: N/A;  . RADIOLOGY WITH ANESTHESIA N/A 07/08/2017   Procedure: IR WITH ANESTHESIA;  Surgeon: Radiologist, Medication, MD;  Location: [facility] OR;  Service: Radiology;  Laterality: N/A;  . TUBAL LIGATION      Current Outpatient Rx  . Order #: [id number] Class: OTC  . Order #: [id number] Class: Normal  . Order #: [id number] Class: Normal  . Order #: [id number] Class: Normal  . Order #: [id number] Class: Normal  . Order #: [id number] Class: Normal  . Order #: [id number] Class: Print  . Order #: [id number] Class: Normal  . Order #: [id number] Class: Normal  . Order #: [id number] Class: Print  . Order #: [id number] Class: Print  . Order #: [id number] Class: Print    Allergies Patient has no known allergies.  Family History  Problem Relation Age of Onset  . Stroke Father   . Heart disease  Brother        arrhythmia; died  . Breast cancer Maternal Aunt     Social History Social History   Tobacco Use  . Smoking status: Former Smoker    Packs/day: 0.20    Years: 1.00    Pack years: 0.20    Types: Cigarettes    Quit date: 10/27/2018    Years since quitting: 0.7  . Smokeless tobacco: Never Used  . Tobacco comment: 2018  " i STILL SMOKE 3  CIGARETTES A DAY "  Substance Use Topics  . Alcohol use: Yes    Alcohol/week: 0.0 standard drinks    Comment: Social drinker  . Drug use: Not Currently    Types: Cocaine    Comment: hx of use x 1 in the past    Review of Systems  All other systems negative except as documented in the HPI. All pertinent positives and negatives as reviewed in the HPI. ____________________________________________  PHYSICAL EXAM:  VITAL SIGNS: ED Triage Vitals [07/16/19 2008]  Enc Vitals Group     BP (!) 135/96     Pulse Rate 75  Resp 18     Temp 98.6 F (37 C)     Temp Source Oral     SpO2 98 %     Weight 165 lb 5.5 oz (75 kg)     Height 5\' 6"  (1.676 m)    Constitutional: Alert and oriented. Well appearing and in no acute distress. Eyes: Conjunctivae are normal. PERRL. EOMI. Head: Atraumatic. Nose: No congestion/rhinnorhea. Mouth/Throat: Mucous membranes are moist.  Oropharynx non-erythematous. Neck: No stridor.  No meningeal signs.   Cardiovascular: tachycardic rate, regular rhythm. Good peripheral circulation. Grossly normal heart sounds.   Respiratory: Normal respiratory effort.  No retractions. Lungs CTAB. Gastrointestinal: Soft and nontender. No distention.  Musculoskeletal: No lower extremity tenderness nor edema. No gross deformities of extremities. Neurologic:  Normal speech and language. No gross focal neurologic deficits are appreciated.  Skin:  Skin is warm, dry and intact. No rash noted.  ____________________________________________   LABS (all labs ordered are listed, but only abnormal results are  displayed)  Labs Reviewed  BASIC METABOLIC PANEL - Abnormal; Notable for the following components:      Result Value   Potassium 3.0 (*)    Glucose, Bld 150 (*)    BUN 5 (*)    Calcium 8.5 (*)    All other components within normal limits  RESPIRATORY PANEL BY RT PCR (FLU A&B, COVID)  CBC  PROTIME-INR  I-STAT BETA HCG BLOOD, ED ([facility], [facility], [facility] ONLY)  TROPONIN I (HIGH SENSITIVITY)  TROPONIN I (HIGH SENSITIVITY)   ____________________________________________  EKG   EKG Interpretation  Date/Time:  Saturday Jul 16 2019 20:04:50 EDT Ventricular Rate:  85 PR Interval:  142 QRS Duration: 72 QT Interval:  370 QTC Calculation: 440 R Axis:   83 Text Interpretation: Normal sinus rhythm Low voltage QRS Cannot rule out Anteroseptal infarct , age undetermined Abnormal ECG No significant change since last tracing Confirmed by Merrily Pew [id number]) on 07/16/2019 11:33:08 PM       ____________________________________________  RADIOLOGY  CT Angio Chest PE W and/or Wo Contrast  Result Date: 07/17/2019 CLINICAL DATA:  Dyspnea and intermittent central chest pain for 2 weeks. Emesis. EXAM: CT ANGIOGRAPHY CHEST WITH CONTRAST TECHNIQUE: Multidetector CT imaging of the chest was performed using the standard protocol during bolus administration of intravenous contrast. Multiplanar CT image reconstructions and MIPs were obtained to evaluate the vascular anatomy. CONTRAST:  161mL OMNIPAQUE IOHEXOL 350 MG/ML SOLN COMPARISON:  Chest radiograph from one day prior. 11/21/2015 chest CT angiogram. FINDINGS: Cardiovascular: The study is high quality for the evaluation of pulmonary embolism. There are no filling defects in the central, lobar, segmental or subsegmental pulmonary artery branches to suggest acute pulmonary embolism. Great vessels are normal in course and caliber. Top-normal heart size. No significant pericardial fluid/thickening. Left anterior descending coronary atherosclerosis. Mediastinum/Nodes: No  discrete thyroid nodules. Unremarkable esophagus. No pathologically enlarged axillary, mediastinal or hilar lymph nodes. Lungs/Pleura: No pneumothorax. No pleural effusion. No acute consolidative airspace disease, lung masses or significant pulmonary nodules. Moderate patchy bandlike ground-glass opacities throughout the lower lungs bilaterally, most prominent in basilar right lower lobe, new. Upper abdomen: Cholecystectomy. Musculoskeletal:  No aggressive appearing focal osseous lesions. Review of the MIP images confirms the above findings. IMPRESSION: 1. No pulmonary embolism. 2. Moderate patchy bandlike ground-glass opacities throughout the lower lungs bilaterally, new, most prominent in the basilar right lower lobe, differential includes hypoventilatory change and/or atypical infection with differential including COVID-19 pneumonia. 3. One vessel coronary atherosclerosis. Electronically Signed   By: Ilona Sorrel  M.D.   On: 07/17/2019 06:13   ____________________________________________  PROCEDURES  Procedure(s) performed:   Procedures ____________________________________________  INITIAL IMPRESSION / ASSESSMENT AND PLAN / ED COURSE   This patient presents to the ED for concern of chest pain and shortness of breath after quitting smoking, this involves an extensive number of treatment options, and is a complaint that carries with it a high risk of complications and morbidity.  The differential diagnosis includes ACS, pulmonary embolus, pneumonia, bronchitis, normal lung healing.     Lab Tests:   I Ordered, reviewed, and interpreted labs, which included CBC, CMP, troponin  Medicines ordered:   I ordered medication albuterol, Tessalon and Hycodan for coughing and chest discomfort.  Imaging Studies ordered:   I ordered imaging studies which included chest x-ray, CT angio and  I independently visualized and interpreted imaging which showed no PE, some mild ground glass opacities.    Additional history obtained:   Additional history obtained from none  Previous records obtained and reviewed and documented as above  Consultations Obtained:   I consulted noone  and discussed lab and imaging findings  Reevaluation:  After the interventions stated above, I reevaluated the patient and found improved breathing with treatments.   Critical Interventions: PE scan was negative as documented above we will treat for atypical/Community acquired pneumonia.  Will test for Covid. Treat at home for same.   A medical screening exam was performed and I feel the patient has had an appropriate workup for their chief complaint at this time and likelihood of emergent condition existing is low. They have been counseled on decision, discharge, follow up and which symptoms necessitate immediate return to the emergency department. They or their family verbally stated understanding and agreement with plan and discharged in stable condition.   ____________________________________________  FINAL CLINICAL IMPRESSION(S) / ED DIAGNOSES  Final diagnoses:  Nonspecific chest pain    MEDICATIONS GIVEN DURING THIS VISIT:  Medications  sodium chloride flush (NS) 0.9 % injection 3 mL (3 mLs Intravenous Given 07/17/19 0443)  HYDROcodone-homatropine (HYCODAN) 5-1.5 MG/5ML syrup 5 mL (5 mLs Oral Given 07/17/19 0442)  benzonatate (TESSALON) capsule 100 mg (100 mg Oral Given 07/17/19 0441)  albuterol (VENTOLIN HFA) 108 (90 Base) MCG/ACT inhaler 2 puff (2 puffs Inhalation Given 07/17/19 0442)  potassium chloride SA (KLOR-CON) CR tablet 40 mEq (40 mEq Oral Given 07/17/19 0441)  iohexol (OMNIPAQUE) 350 MG/ML injection 100 mL (100 mLs Intravenous Contrast Given 07/17/19 0515)    NEW OUTPATIENT MEDICATIONS STARTED DURING THIS VISIT:  Discharge Medication List as of 07/17/2019  7:13 AM    START taking these medications   Details  azithromycin (ZITHROMAX Z-PAK) 250 MG tablet 2 po day one, then 1 daily x 4 days,  Print    HYDROcodone-homatropine (HYCODAN) 5-1.5 MG/5ML syrup Take 5 mLs by mouth every 6 (six) hours as needed for cough., Starting Sun 07/17/2019, Print    predniSONE (DELTASONE) 20 MG tablet 3 tabs po day one, then 2 po daily x 4 days, Print        Note:  This note was prepared with assistance of Dragon voice recognition software. Occasional wrong-word or sound-a-like substitutions may have occurred due to the inherent limitations of voice recognition software.   Leeta Grimme, Barbara Cower, MD 07/18/19 [phone]

## 2019-07-17 NOTE — ED Notes (Signed)
Pt. Declined COVID swab/test.

## 2019-09-16 DIAGNOSIS — R05 Cough: Secondary | ICD-10-CM | POA: Diagnosis not present

## 2019-09-16 DIAGNOSIS — I10 Essential (primary) hypertension: Secondary | ICD-10-CM | POA: Diagnosis not present

## 2019-09-16 DIAGNOSIS — F419 Anxiety disorder, unspecified: Secondary | ICD-10-CM | POA: Diagnosis not present

## 2019-09-16 DIAGNOSIS — E785 Hyperlipidemia, unspecified: Secondary | ICD-10-CM | POA: Diagnosis not present

## 2019-09-16 DIAGNOSIS — Z7289 Other problems related to lifestyle: Secondary | ICD-10-CM | POA: Diagnosis not present

## 2019-09-16 DIAGNOSIS — R454 Irritability and anger: Secondary | ICD-10-CM | POA: Diagnosis not present

## 2019-09-16 DIAGNOSIS — R4589 Other symptoms and signs involving emotional state: Secondary | ICD-10-CM | POA: Diagnosis not present

## 2019-09-16 DIAGNOSIS — R300 Dysuria: Secondary | ICD-10-CM | POA: Diagnosis not present

## 2019-09-23 DIAGNOSIS — R4589 Other symptoms and signs involving emotional state: Secondary | ICD-10-CM | POA: Diagnosis not present

## 2019-09-23 DIAGNOSIS — G479 Sleep disorder, unspecified: Secondary | ICD-10-CM | POA: Diagnosis not present

## 2019-09-23 DIAGNOSIS — Z7289 Other problems related to lifestyle: Secondary | ICD-10-CM | POA: Diagnosis not present

## 2019-09-23 DIAGNOSIS — F419 Anxiety disorder, unspecified: Secondary | ICD-10-CM | POA: Diagnosis not present

## 2019-11-02 DIAGNOSIS — F1010 Alcohol abuse, uncomplicated: Secondary | ICD-10-CM | POA: Diagnosis not present

## 2019-11-02 DIAGNOSIS — K644 Residual hemorrhoidal skin tags: Secondary | ICD-10-CM | POA: Diagnosis not present

## 2019-11-02 DIAGNOSIS — K219 Gastro-esophageal reflux disease without esophagitis: Secondary | ICD-10-CM | POA: Diagnosis not present

## 2019-11-10 DIAGNOSIS — Z7289 Other problems related to lifestyle: Secondary | ICD-10-CM | POA: Diagnosis not present

## 2019-11-10 DIAGNOSIS — F419 Anxiety disorder, unspecified: Secondary | ICD-10-CM | POA: Diagnosis not present

## 2019-11-30 DIAGNOSIS — Z7289 Other problems related to lifestyle: Secondary | ICD-10-CM | POA: Diagnosis not present

## 2019-11-30 DIAGNOSIS — F419 Anxiety disorder, unspecified: Secondary | ICD-10-CM | POA: Diagnosis not present

## 2020-01-24 ENCOUNTER — Other Ambulatory Visit: Payer: Self-pay

## 2020-01-24 ENCOUNTER — Emergency Department (HOSPITAL_COMMUNITY): Payer: Medicare HMO

## 2020-01-24 ENCOUNTER — Encounter (HOSPITAL_COMMUNITY): Payer: Self-pay | Admitting: Emergency Medicine

## 2020-01-24 ENCOUNTER — Emergency Department (HOSPITAL_COMMUNITY)
Admission: EM | Admit: 2020-01-24 | Discharge: 2020-01-24 | Disposition: A | Discharge status: Home / Self Care | Payer: Medicare HMO | Attending: Emergency Medicine | Admitting: Emergency Medicine

## 2020-01-24 DIAGNOSIS — Z955 Presence of coronary angioplasty implant and graft: Secondary | ICD-10-CM | POA: Diagnosis not present

## 2020-01-24 DIAGNOSIS — Z87891 Personal history of nicotine dependence: Secondary | ICD-10-CM | POA: Diagnosis not present

## 2020-01-24 DIAGNOSIS — I2510 Atherosclerotic heart disease of native coronary artery without angina pectoris: Secondary | ICD-10-CM | POA: Diagnosis not present

## 2020-01-24 DIAGNOSIS — I700 Atherosclerosis of aorta: Secondary | ICD-10-CM | POA: Diagnosis not present

## 2020-01-24 DIAGNOSIS — Z79899 Other long term (current) drug therapy: Secondary | ICD-10-CM | POA: Insufficient documentation

## 2020-01-24 DIAGNOSIS — R109 Unspecified abdominal pain: Secondary | ICD-10-CM | POA: Diagnosis present

## 2020-01-24 DIAGNOSIS — K860 Alcohol-induced chronic pancreatitis: Secondary | ICD-10-CM | POA: Insufficient documentation

## 2020-01-24 DIAGNOSIS — Z9851 Tubal ligation status: Secondary | ICD-10-CM | POA: Diagnosis not present

## 2020-01-24 DIAGNOSIS — Z7901 Long term (current) use of anticoagulants: Secondary | ICD-10-CM | POA: Insufficient documentation

## 2020-01-24 DIAGNOSIS — I10 Essential (primary) hypertension: Secondary | ICD-10-CM | POA: Insufficient documentation

## 2020-01-24 DIAGNOSIS — K859 Acute pancreatitis without necrosis or infection, unspecified: Secondary | ICD-10-CM | POA: Diagnosis not present

## 2020-01-24 DIAGNOSIS — K5730 Diverticulosis of large intestine without perforation or abscess without bleeding: Secondary | ICD-10-CM | POA: Diagnosis not present

## 2020-01-24 LAB — URINALYSIS, ROUTINE W REFLEX MICROSCOPIC
Glucose, UA: NEGATIVE mg/dL
Hgb urine dipstick: NEGATIVE
Ketones, ur: 5 mg/dL — AB
Leukocytes,Ua: NEGATIVE
Nitrite: NEGATIVE
Protein, ur: 100 mg/dL — AB
Specific Gravity, Urine: 1.034 — ABNORMAL HIGH (ref 1.005–1.030)
pH: 5 (ref 5.0–8.0)

## 2020-01-24 LAB — CBC
HCT: 41.4 % (ref 36.0–46.0)
Hemoglobin: 13.8 g/dL (ref 12.0–15.0)
MCH: 32.6 pg (ref 26.0–34.0)
MCHC: 33.3 g/dL (ref 30.0–36.0)
MCV: 97.9 fL (ref 80.0–100.0)
Platelets: 202 10*3/uL (ref 150–400)
RBC: 4.23 MIL/uL (ref 3.87–5.11)
RDW: 14.3 % (ref 11.5–15.5)
WBC: 7 10*3/uL (ref 4.0–10.5)
nRBC: 0 % (ref 0.0–0.2)

## 2020-01-24 LAB — COMPREHENSIVE METABOLIC PANEL
ALT: 26 U/L (ref 0–44)
AST: 39 U/L (ref 15–41)
Albumin: 3.5 g/dL (ref 3.5–5.0)
Alkaline Phosphatase: 186 U/L — ABNORMAL HIGH (ref 38–126)
Anion gap: 12 (ref 5–15)
BUN: 5 mg/dL — ABNORMAL LOW (ref 6–20)
CO2: 25 mmol/L (ref 22–32)
Calcium: 9.2 mg/dL (ref 8.9–10.3)
Chloride: 102 mmol/L (ref 98–111)
Creatinine, Ser: 0.67 mg/dL (ref 0.44–1.00)
GFR, Estimated: >60 mL/min (ref >60)
Glucose, Bld: 107 mg/dL — ABNORMAL HIGH (ref 70–99)
Potassium: 3.2 mmol/L — ABNORMAL LOW (ref 3.5–5.1)
Sodium: 139 mmol/L (ref 135–145)
Total Bilirubin: 0.9 mg/dL (ref 0.3–1.2)
Total Protein: 7.2 g/dL (ref 6.5–8.1)

## 2020-01-24 LAB — I-STAT BETA HCG BLOOD, ED (MC, WL, AP ONLY): I-stat hCG, quantitative: <5 m[IU]/mL (ref <5)

## 2020-01-24 LAB — LIPASE, BLOOD: Lipase: 38 U/L (ref 11–51)

## 2020-01-24 MED ORDER — ONDANSETRON HCL 4 MG/2ML IJ SOLN
4.0000 mg | Freq: Once | INTRAMUSCULAR | Status: AC
  Administered 2020-01-24: 4 mg via INTRAVENOUS
  Filled 2020-01-24: qty 2

## 2020-01-24 MED ORDER — OXYCODONE HCL 5 MG PO TABS: 5.0000 mg | ORAL_TABLET | Freq: Four times a day (QID) | ORAL | 0 refills | Status: AC | PRN

## 2020-01-24 MED ORDER — MORPHINE SULFATE (PF) 4 MG/ML IV SOLN
4.0000 mg | Freq: Once | INTRAVENOUS | Status: AC
  Administered 2020-01-24: 4 mg via INTRAVENOUS
  Filled 2020-01-24: qty 1

## 2020-01-24 MED ORDER — ONDANSETRON HCL 4 MG PO TABS
4.0000 mg | ORAL_TABLET | Freq: Three times a day (TID) | ORAL | 0 refills | Status: DC | PRN
Start: 2020-01-24 — End: 2020-08-29

## 2020-01-24 MED ORDER — IOHEXOL 300 MG/ML  SOLN
100.0000 mL | Freq: Once | INTRAMUSCULAR | Status: AC | PRN
  Administered 2020-01-24: 100 mL via INTRAVENOUS

## 2020-01-24 MED ORDER — FENTANYL CITRATE (PF) 100 MCG/2ML IJ SOLN
50.0000 ug | Freq: Once | INTRAMUSCULAR | Status: AC
  Administered 2020-01-24: 50 ug via INTRAVENOUS
  Filled 2020-01-24: qty 2

## 2020-01-24 NOTE — ED Triage Notes (Signed)
Pt coming from home. Pt complaint of abdominal pain and constipation x1 week. VSS. NAD.

## 2020-01-24 NOTE — Discharge Instructions (Signed)
You were seen in the ED for abdominal pain, nausea and vomiting  Your lipase, pancreas number was normal.  However, your CT showed your pancreas was inflamed.  You have pancreatitis, given your previous history, current imaging I think you have chronic pancreatitis.  Do not drink any alcohol.  Avoid ibuprofen.  Take 500 to 1000 mg of acetaminophen every 6 hours for mild to moderate pain.  Take 5 mg of oxycodone every 6 hours for severe or breakthrough pain.  Note that this medication will cause constipation and should be taking with a stool softener or laxative.  Use ondansetron every 6-8 hours for nausea.  Clear liquid diet and low-fat, small meals every 4 hours.  Advance her diet as tolerated.

## 2020-01-24 NOTE — ED Provider Notes (Signed)
[facility] EMERGENCY DEPARTMENT Provider Note   CSN: [id number] Arrival date & time: 01/24/20  1101     History Chief Complaint  Patient presents with  . Abdominal Pain  . Constipation    Lindsey [name] is a 49 y.o. female with history of strokes, MI on Xarelto and Plavix, cholecystectomy, pancreatitis, tobacco use presents to the ED for evaluation of abdominal pain for the last 1.5 weeks.  Daily, worsening.  Moderate to severe.  Located in the epigastrium radiating to the left upper quadrant, left flank.  Constant.  Worse if she lays down flat.  Associated with nausea and vomiting.  Was constipated for 2 to 3 weeks but "forced" herself to have a bowel movement at 6 AM this morning.  States it was firm and then turned into diarrhea.  Has not passed gas since.  Has noticed increased urinary frequency as well but no dysuria, hematuria, urgency.  Has vomited 4 times today.  Has been taking 1000 mg of acetaminophen every 4 hours but states this is not helping so is also taking 600 mg of ibuprofen, states she is not supposed to take ibuprofen given her other medicines.  Take Zofran ODT as well for nausea and vomiting but continues to vomit.  She is worried about pancreatitis although states this pain feels more severe.  Used to have a problem with alcohol but states this is being treated with therapy and meetings.  Had one alcoholic beverage 2 weeks ago.  Denies marijuana use.  No fever.  No chest pain or shortness of breath.  No other abdominal surgeries.  No upper respiratory infection symptoms, has had 1 Covid vaccine.   HPI     Past Medical History:  Diagnosis Date  . Anemia   . Antithrombin III deficiency (HCC)    ?pt not sure if true diagnosis  . Anxiety   . Apical mural thrombus   . Blood transfusion   . Cholecystitis 07/2016  . Coronary artery disease    a. apical LAD infarction '00. b. NSTEMI s/p BMS to prox LAD '09. c. Cath 01/2015: stable LAD stent,  otherwise minimal nonobstructive CAD.  Marland Kitchen GERD (gastroesophageal reflux disease)   . Hyperlipidemia   . Hypertension   . Myocardial infarction (HCC)   . Noncompliance with medication regimen    a. h/o noncompliance with med regimen (previous running out of Coumadin).  . Pancreatitis   . Pancreatitis, acute   . 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 Stroke (HCC)    assoc with short term memory loss and right peripheral vision loss; age 66   . TIA (transient ischemic attack) 2010  . Tobacco abuse     Patient Active Problem List   Diagnosis Date Noted  . Acute pancreatitis 11/04/2018  . Abdominal pain 11/03/2018  . Pancreatitis, acute   . Chronic left shoulder pain 11/27/2017  . PMB (postmenopausal bleeding) 09/23/2017  . Acute blood loss anemia   . Vascular headache   . Hemiparesis affecting left side as late effect of stroke (HCC)   . Tobacco use disorder 07/13/2017  . Noncompliance with medication regimen 07/13/2017  . Acute ischemic right MCA stroke (HCC) 07/13/2017  . Benign essential HTN   . Stroke (cerebrum) (HCC) 07/08/2017  . Middle cerebral artery embolism, right 07/08/2017  . Vitamin B12 deficiency 08/15/2016  . Acute cholecystitis 08/14/2016  . GERD (gastroesophageal reflux disease) 04/24/2015  . Hypertensive urgency 04/23/2015  . Chest pain, unspecified   . Right leg swelling  03/07/2015  . Preventative health care 02/21/2015  . Esophageal reflux   . NSTEMI (non-ST elevated myocardial infarction) (HCC) 01/28/2015  . Nausea without vomiting 12/22/2014  . Dysuria 12/21/2014  . Diarrhea 09/26/2014  . Difficulty swallowing pills 09/26/2014  . Hemorrhoids 06/16/2014  . Elevated AST (SGOT) 03/27/2014  . Lumbar herniated disc 03/27/2014  . TIA (transient ischemic attack) 02/19/2014  . History of ischemic left PCA stroke 02/19/2014  . Palpitations 01/27/2014  . Depression 08/15/2013  . Long term current use of anticoagulant therapy 08/10/2013  . Gastritis and  duodenitis 08/03/2013  . Diverticulosis 08/03/2013  . Fatty liver 08/03/2013  . Esophagitis 08/03/2013  . Hiatal hernia 08/03/2013  . Apical mural thrombus 08/02/2013  . GI bleeding 06/04/2012  . Hypokalemia 06/04/2012  . Antithrombin III deficiency (HCC) 06/04/2012  . Paresthesias 06/29/2011  . Hyperlipemia 06/07/2009  . Essential hypertension 05/17/2009  . OBESITY-MORBID (>100') 03/13/2008  . CAD S/P  BMS to proximal LAD 2009-patent 01/29/15 06/28/2007    Past Surgical History:  Procedure Laterality Date  . CARDIAC CATHETERIZATION    . CARDIAC CATHETERIZATION N/A 01/29/2015   Procedure: Left Heart Cath and Coronary Angiography;  Surgeon: Peter M Swaziland, MD;  Location: [facility] INVASIVE CV LAB;  Service: Cardiovascular;  Laterality: N/A;  . CHOLECYSTECTOMY N/A 08/14/2016   Procedure: LAPAROSCOPIC CHOLECYSTECTOMY;  Surgeon: Gaynelle Adu, MD;  Location: [facility] OR;  Service: General;  Laterality: N/A;  . COLONOSCOPY N/A 03/29/2014   Procedure: COLONOSCOPY;  Surgeon: Louis Meckel, MD;  Location: [facility] ENDOSCOPY;  Service: Endoscopy;  Laterality: N/A;  . CORONARY ANGIOPLASTY    . ESOPHAGOGASTRODUODENOSCOPY N/A 06/09/2012   Procedure: ESOPHAGOGASTRODUODENOSCOPY (EGD);  Surgeon: Theda Belfast, MD;  Location: [facility] ENDOSCOPY;  Service: Endoscopy;  Laterality: N/A;  . ESOPHAGOGASTRODUODENOSCOPY N/A 08/02/2013   Procedure: ESOPHAGOGASTRODUODENOSCOPY (EGD);  Surgeon: Meryl Dare, MD;  Location: [facility] ENDOSCOPY;  Service: Endoscopy;  Laterality: N/A;  . IR ANGIO INTRA EXTRACRAN SEL COM CAROTID INNOMINATE UNI R MOD SED  07/08/2017  . LEFT HEART CATHETERIZATION WITH CORONARY ANGIOGRAM N/A 12/24/2011   Procedure: LEFT HEART CATHETERIZATION WITH CORONARY ANGIOGRAM;  Surgeon: Kathleene Hazel, MD;  Location: [facility] CATH LAB;  Service: Cardiovascular;  Laterality: N/A;  . RADIOLOGY WITH ANESTHESIA N/A 07/08/2017   Procedure: IR WITH ANESTHESIA;  Surgeon: Radiologist, Medication, MD;  Location: [facility] OR;  Service:  Radiology;  Laterality: N/A;  . TUBAL LIGATION       OB History    Gravida  2   Para  2   Term  2   Preterm      AB      Living        SAB      TAB      Ectopic      Multiple      Live Births              Family History  Problem Relation Age of Onset  . Stroke Father   . Heart disease Brother        arrhythmia; died  . Breast cancer Maternal Aunt     Social History   Tobacco Use  . Smoking status: Former Smoker    Packs/day: 0.20    Years: 1.00    Pack years: 0.20    Types: Cigarettes    Quit date: 10/27/2018    Years since quitting: 1.2  . Smokeless tobacco: Never Used  . Tobacco comment: 2018  " i STILL SMOKE 3  CIGARETTES A DAY "  Vaping Use  . Vaping Use: Former  Substance Use Topics  . Alcohol use: Yes    Alcohol/week: 0.0 standard drinks    Comment: Social drinker  . Drug use: Not Currently    Types: Cocaine    Comment: hx of use x 1 in the past    Home Medications Prior to Admission medications   Medication Sig Start Date End Date Taking? Authorizing Provider  acetaminophen (TYLENOL) 325 MG tablet Take 1-2 tablets (325-650 mg total) by mouth every 4 (four) hours as needed for mild pain. 07/14/17   Love, Evlyn Kanner, PA-C  amLODipine (NORVASC) 10 MG tablet Take 1 tablet (10 mg total) by mouth daily. 11/04/18   Black, Lesle Chris, NP  atorvastatin (LIPITOR) 40 MG tablet Take 1 tablet (40 mg total) by mouth daily. 11/04/18   Black, Lesle Chris, NP  azithromycin (ZITHROMAX Z-PAK) 250 MG tablet 2 po day one, then 1 daily x 4 days 07/17/19   Mesner, Barbara Cower, MD  carvedilol (COREG) 25 MG tablet Take 1 tablet (25 mg total) by mouth 2 (two) times daily with a meal. 11/04/18   Black, Lesle Chris, NP  clopidogrel (PLAVIX) 75 MG tablet Take 1 tablet (75 mg total) by mouth daily. 11/04/18   Black, Lesle Chris, NP  hydrochlorothiazide (HYDRODIURIL) 25 MG tablet Take 0.5 tablets (12.5 mg total) by mouth daily. 11/04/18   Black, Lesle Chris, NP  HYDROcodone-homatropine (HYCODAN) 5-1.5  MG/5ML syrup Take 5 mLs by mouth every 6 (six) hours as needed for cough. 07/17/19   Mesner, Barbara Cower, MD  nitroGLYCERIN (NITROSTAT) 0.4 MG SL tablet Place 1 tablet (0.4 mg total) under the tongue every 5 (five) minutes as needed for chest pain (up to 3 doses). 08/16/16   Rodolph Bong, MD  ondansetron (ZOFRAN) 4 MG tablet Take 1 tablet (4 mg total) by mouth every 8 (eight) hours as needed for nausea or vomiting. 01/24/20   Liberty Handy, PA-C  oxyCODONE (OXY IR/ROXICODONE) 5 MG immediate release tablet Take 1 tablet (5 mg total) by mouth every 6 (six) hours as needed for up to 3 days for severe pain or breakthrough pain. 01/24/20 01/27/20  Liberty Handy, PA-C  pantoprazole (PROTONIX) 40 MG tablet Take 1 tablet (40 mg total) by mouth 2 (two) times daily. 11/04/18   Black, Lesle Chris, NP  predniSONE (DELTASONE) 20 MG tablet 3 tabs po day one, then 2 po daily x 4 days 07/17/19   Mesner, Barbara Cower, MD  rivaroxaban (XARELTO) 20 MG TABS tablet Take 1 tablet (20 mg total) by mouth daily. 11/04/18   Black, Lesle Chris, NP  sucralfate (CARAFATE) 1 g tablet Take 1 tablet (1 g total) by mouth 4 (four) times daily. Patient not taking: Reported on 06/11/2019 11/04/18 07/17/19  Hughie Closs, MD    Allergies    Patient has no known allergies.  Review of Systems   Review of Systems  Gastrointestinal: Positive for abdominal pain, nausea and vomiting.  Genitourinary: Positive for frequency.  All other systems reviewed and are negative.   Physical Exam Updated Vital Signs BP 126/87   Pulse (!) 58   Temp 99.1 F (37.3 C) (Oral)   Resp 12   Ht  (1.676 m)   Wt 68 kg   SpO2 97%   BMI 24.21 kg/m   Physical Exam Vitals and nursing note reviewed.  Constitutional:      Appearance: She is well-developed.     Comments: Non toxic in NAD  HENT:  Head: Normocephalic and atraumatic.     Nose: Nose normal.  Eyes:     Conjunctiva/sclera: Conjunctivae normal.  Cardiovascular:     Rate and Rhythm: Normal rate  and regular rhythm.  Pulmonary:     Effort: Pulmonary effort is normal.     Breath sounds: Normal breath sounds.  Abdominal:     General: Bowel sounds are normal.     Palpations: Abdomen is soft.     Tenderness: There is abdominal tenderness (Left upper quadrant, left mid/lateral abdomen). There is left CVA tenderness.     Comments: No G/R/R. No suprapubic tenderness. Negative Murphy's and McBurney's. Active BS to lower quadrants.   Musculoskeletal:        General: Normal range of motion.     Cervical back: Normal range of motion.  Skin:    General: Skin is warm and dry.     Capillary Refill: Capillary refill takes less than 2 seconds.  Neurological:     Mental Status: She is alert.  Psychiatric:        Behavior: Behavior normal.     ED Results / Procedures / Treatments   Labs (all labs ordered are listed, but only abnormal results are displayed) Labs Reviewed  COMPREHENSIVE METABOLIC PANEL - Abnormal; Notable for the following components:      Result Value   Potassium 3.2 (*)    Glucose, Bld 107 (*)    BUN 5 (*)    Alkaline Phosphatase 186 (*)    All other components within normal limits  URINALYSIS, ROUTINE W REFLEX MICROSCOPIC - Abnormal; Notable for the following components:   Color, Urine AMBER (*)    APPearance HAZY (*)    Specific Gravity, Urine 1.034 (*)    Bilirubin Urine MODERATE (*)    Ketones, ur 5 (*)    Protein, ur 100 (*)    Bacteria, UA RARE (*)    All other components within normal limits  LIPASE, BLOOD  CBC  I-STAT BETA HCG BLOOD, ED ([facility], [facility], [facility] ONLY)    EKG None  Radiology CT ABDOMEN PELVIS W CONTRAST  Result Date: 01/24/2020 CLINICAL DATA:  49 year old female with left flank pain. EXAM: CT ABDOMEN AND PELVIS WITH CONTRAST TECHNIQUE: Multidetector CT imaging of the abdomen and pelvis was performed using the standard protocol following bolus administration of intravenous contrast. CONTRAST:  OMNIPAQUE IOHEXOL 300 MG/ML  SOLN COMPARISON:  CT  abdomen pelvis dated 11/03/2018. FINDINGS: Lower chest: The visualized lung bases are clear. No intra-abdominal free air or free fluid. Hepatobiliary: Diffuse fatty liver. No intrahepatic biliary ductal dilatation. Cholecystectomy. No retained calcified stone noted in the central CBD. Pancreas: There is mild inflammatory changes of the pancreas consistent with acute pancreatitis. No drainable fluid collection/abscess or pseudocyst. Spleen: Normal in size without focal abnormality. Adrenals/Urinary Tract: The adrenal glands unremarkable. There is no hydronephrosis on either side. There is symmetric enhancement and excretion of contrast by both kidneys. Subcentimeter right renal hypodense focus is too small to characterize. The visualized ureters and urinary bladder appear unremarkable. Stomach/Bowel: There is scattered colonic diverticula without active inflammatory changes. There is no bowel obstruction or active inflammation. The appendix is normal. Vascular/Lymphatic: Mild aortoiliac atherosclerotic disease. The IVC is unremarkable. No portal venous gas. There is no adenopathy. Reproductive: The uterus is grossly unremarkable. Bilateral tubal ligation clips. No adnexal masses. Other: None Musculoskeletal: Disc desiccation at L4-5. No acute osseous pathology. IMPRESSION: 1. Acute pancreatitis. No abscess or pseudocyst. 2. Fatty liver. 3. Colonic diverticulosis. No bowel  obstruction. Normal appendix. 4. Aortic Atherosclerosis (ICD10-I70.0). Electronically Signed   By: Elgie Collard M.D.   On: 01/24/2020 19:32    Procedures Procedures (including critical care time)  Medications Ordered in ED Medications  morphine 4 MG/ML injection 4 mg (4 mg Intravenous Given 01/24/20 1542)  ondansetron (ZOFRAN) injection 4 mg (4 mg Intravenous Given 01/24/20 1543)  fentaNYL (SUBLIMAZE) injection 50 mcg (50 mcg Intravenous Given 01/24/20 1759)  iohexol (OMNIPAQUE) 300 MG/ML solution 100 mL (100 mLs Intravenous Contrast  Given 01/24/20 1915)    ED Course  I have reviewed the triage vital signs and the nursing notes.  Pertinent labs & imaging results that were available during my care of the patient were reviewed by me and considered in my medical decision making (see chart for details).  Clinical Course as of Jan 24 1951  Tue Jan 24, 2020  1501 COVID august first dose    [CG]  1641 FINDINGS: Hepatobiliary: No focal liver abnormality is seen. Patient is post cholecystectomy. No calcified intraductal gallstones.  Pancreas: There is diffuse edematous thickening of the pancreatic parenchyma with surrounding peripancreatic phlegmonous change. No geographically hypoenhancing pancreatic parenchyma to suggest pancreatic necrosis or acute necrotic collection.  There are a few subcentimeter hypoattenuating cystic pancreatic foci which are unchanged from prior study and could reflect small pseudocyst versus side branch IPMN.  Adrenals/Urinary Tract: Normal adrenal glands. Few subcentimeter hypoattenuating focus in both kidneys, too small to fully characterize on CT imaging but statistically likely benign. Kidneys are otherwise unremarkable, without renal calculi, suspicious lesion, or hydronephrosis. Some early excretion of contrast with media seen within the renal pelves. Bladder is unremarkable.  Stomach/Bowel: Distal esophagus, stomach and duodenal sweep are unremarkable. No bowel wall thickening or dilatation. No evidence of obstruction. A normal appendix is visualized. Scattered colonic diverticula without focal pericolonic inflammation to suggest diverticulitis.  Vascular/Lymphatic: Atherosclerotic plaque within the normal caliber aorta. No occlusive filling defects are identified in the vessels adjacent to the inflamed pancreas. Reactive adenopathy is present in the upper abdomen. No pathologically enlarged lymph nodes.  Reproductive: Normal uterus. Bilateral ligation clips. No suspicious in the adnexal  lesions.  Other: No abdominopelvic free fluid or free gas. No bowel containing hernias. Small fat containing umbilical hernia.  IMPRESSION: Acute interstitial edematous pancreatitis without evidence of pancreatic necrosis or acute necrotic collection.  Few subcentimeter hypoattenuating cystic pancreatic lesions, stable from prior studies as remote as 2015   [CG]  1748 Alkaline Phosphatase(!): 186 [CG]  1748 Bilirubin Urine(!): MODERATE [CG]  1748 Bacteria, UA(!): RARE [CG]  1748 Squamous Epithelial / LPF: 6-10 [CG]  1939 IMPRESSION:  1. Acute pancreatitis. No abscess or Pseudocyst.  2. Fatty liver.  3. Colonic diverticulosis. No bowel obstruction. Normal appendix.  4. Aortic Atherosclerosis (ICD10-I70.0).   CT ABDOMEN PELVIS W CONTRAST [CG]  1945 Reevaluated patient and has a complete resolution of abdominal pain, nausea.  Tolerating p.o.  Discussed findings.   [CG]    Clinical Course User Index [CG] Jerrell Mylar   MDM Rules/Calculators/A&P                           49 year old female with 1.5 weeks of epigastric, left/lateral abdominal and flank pain, nausea, vomiting and urinary frequency.  History of pancreatitis.  EMR, triage nurse notes reviewed to obtain more history and assist with MDM  Had suspected alcoholic induced pancreatitis a couple of years ago.  CT A/P at that done was reviewed.  Had acute pancreatitis with some hypoattenuating pancreatic cystic lesions that were stable possibly pseudocysts, as well as a few hypoattenuating foci in the kidneys, diverticula but no nephrolithiasis.  ER work-up initiated in triage by triage RN including lab work, UA.  Lab work ordered: CBC, CMP, lipase, hCG, urinalysis.  Meds ordered: Morphine, Zofran.  DDx includes viral gastroenteritis, pancreatitis, gastritis/GERD.  She has urinary frequency and left CVA tenderness so GU process like pyelonephritis/UTI is on differential.  Constipation has resolved but no longer  passing gas.  SBO, diverticulitis, appendicitis less likely as well.  1945: Reevaluated patient after morphine and she had continued/persistent pain.  CT A/P was ordered.    CT AP personally reviewed - acute pancreatitis, imaging reviewed with previous CT A/P and pancreas appears to be similar in appearance.  Suspect some chronic pancreatitis.  Patient states she only drank one drink 2 weeks ago.  Likely the culprit.  She is feeling better after repeat pain medicine.  Tolerating p.o. Discussed complete ETOH cessation. Will dc with tylenol, oxycodone, clear liquids. Return precautions given. Patient in agreement with POC.   Final Clinical Impression(s) / ED Diagnoses Final diagnoses:  Alcohol-induced chronic pancreatitis (HCC)    Rx / DC Orders ED Discharge Orders         Ordered    oxyCODONE (OXY IR/ROXICODONE) 5 MG immediate release tablet  Every 6 hours PRN        01/24/20 1952    ondansetron (ZOFRAN) 4 MG tablet  Every 8 hours PRN        01/24/20 1952           Liberty Handy, PA-C 01/24/20 1953    Charlynne Pander, MD 01/25/20 1256
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
## 2020-01-24 NOTE — ED Notes (Signed)
Pt able to tolerate fluids no sign of nausea or vomiting. 

## 2020-08-27 ENCOUNTER — Ambulatory Visit: Payer: Self-pay | Admitting: Surgery

## 2020-08-27 NOTE — H&P (Signed)
Lindsey [name] Appointment: 08/27/2020 10:15 AM Location: [facility] Patient #: [id number] DOB: 1971/03/09 Single / Language: [place] / Race: Black or African American Female  History of Present Illness Ardeth Sportsman MD; 08/27/2020 10:37 AM) The patient is a 50 year old female who presents with hemorrhoids. Note for "Hemorrhoids": ` ` ` Patient sent for surgical consultation at the request of Payton Emerald, MD  Chief Complaint: Hemorrhoids. ` ` The patient is a woman with history of stroke and possible myocardial infarction 2015-2016.  Fully anticoagulated on throughout on Plavix.  She also takes Environmental manager.  She is hesitant to come off of one of them.  Followed by Dr. Jens Som with cardiology remotely.  She had moderately dense hemiparesis at first but through rehabilitation regained all that and is rather functional and independent now.  She does still smoke but she is planning to back off.  Leading and underwent a colonoscopy 2016 by Dr. Arlyce Dice noted no major issues aside from some hemorrhoids.  Was sent to [place] for evaluation.  Dr. Andrey Campanile with our group saw her.  She did not have any active thrombosed hemorrhoids required urgent surgical intervention.  Recommended soaks in bowel regimen at the time.  Not seen by [place] for the past 6 years.  Apparently having worsening complaints of her hemorrhoid for surgical consultation requested to evaluate again.  She notes she gets bleeding when she wipes.  Some discomfort.  No severe pain.  She feels like something is out all the time now.  She still smokes but is try to get back.  Usually moves her bowels once a morning with her coffee.  No fiber supplement.  She can walk at least a half hour without difficulty.  No diabetes or sleep apnea.  She has had some heartburn and reflux usage but that seems relatively controlled.  Occasionally followed by [facility] but primarily [facility].   Had some esophagitis and gastritis with small  hiatal hernia treated with PPI.  No major issues since.  I don't think she is on anything regularly any more.  No personal nor family history of GI/colon cancer, inflammatory bowel disease, irritable bowel syndrome, allergy such as Celiac Sprue, dietary/dairy problems, colitis, ulcers nor gastritis.  No recent sick contacts/gastroenteritis.  No travel outside the country.  No changes in diet.  No dysphagia to solids or liquids.  No significant heartburn or reflux.  No melena, hematemesis, coffee ground emesis.  No evidence of prior gastric/peptic ulceration.   Past cardiac history 2016:  HPI: FU CAD; s/p apical infarct in 2000 and NSTEMI in 2009 tx with BMS to LAD, prior CVA, ? Anti-thrombin III deficiency, HTN, HL.  Echo 5/15 demonstrated apical mural thrombus.  Placed on coumadin.  ASA and Plavix were d/c'd. Carotid Dopplers December 2015 showed 1-39% bilateral stenosis. Monitor 12/15 showed sinus to sinus tachycardia with PVCs. Admitted recently with chest pain and troponin minimally elevated. Cardiac catheterization November 2016 showed no obstructive coronary disease. There was mild LV dysfunction with akinesis of the apex and note of an apical thrombus. Echocardiogram November 2016 showed normal LV function with akinesis of the apex. There was grade 2 diastolic dysfunction. There was note of an apical thrombus. Cardiac MRI November 2016 showed ejection fraction 50% with apical akinesis.  (Review of systems as stated in this history (HPI) or in the review of systems.  Otherwise all other 12 point ROS are negative) ` ` ###########################################`  This patient encounter took 45 minutes today to perform the  following: obtain history, perform exam, review outside records, interpret tests & imaging, counsel the patient on their diagnosis; and, document this encounter, including findings & plan in the electronic health record (EHR).   Problem List/Past Medical Ardeth Sportsman, MD;  08/27/2020 10:37 AM) Neta Mends FOR PREOPERATIVE EXAMINATION FOR GENERAL SURGICAL PROCEDURE (Z01.818)  HISTORY OF STROKE (Z86.73)  BLEEDING HEMORRHOID (K64.9)  PROLAPSED INTERNAL HEMORRHOIDS, GRADE 2 (K64.1)  TOBACCO ABUSE (Z72.0)  ANTITHROMBIN DEFICIENCY (D68.59)  INTERNAL AND EXTERNAL BLEEDING HEMORRHOIDS (K64.4, K64.8)  PROLAPSED INTERNAL HEMORRHOIDS, GRADE 4 (K64.3)  HISTORY OF MI (MYOCARDIAL INFARCTION) (I25.2)  ANTICOAGULATED (Z79.01)   Past Surgical History Ardeth Sportsman, MD; 08/27/2020 10:37 AM) Gallbladder Surgery - Open  No pertinent past surgical history   Diagnostic Studies History Elease Hashimoto Spillers, CMA; 08/27/2020 9:58 AM) Colonoscopy  1-5 years ago within last year Mammogram  1-3 years ago never Pap Smear  1-5 years ago  Allergies Ardeth Sportsman, MD; 08/27/2020 10:37 AM) No Known Drug Allergies  [06/16/2014]:  Medication History Ardeth Sportsman, MD; 08/27/2020 10:37 AM) amLODIPine Besylate  (10MG  Tablet, Oral) Active. Carvedilol  (25MG  Tablet, Oral) Active. Clopidogrel Bisulfate  (75MG  Tablet, Oral) Active. Xarelto  (20MG  Tablet, Oral) Active. Sertraline HCl  (50MG  Tablet, Oral) Active. hydroCHLOROthiazide  (25MG  Tablet, Oral) Active. Ezetimibe  (10MG  Tablet, Oral) Active. Medications Reconciled  Atorvastatin Calcium  (40MG  Tablet, Oral) Active. Nitrostat  (0.4MG  Tab Sublingual, Sublingual) Active.  Social History , CMA; 08/27/2020 9:58 AM) Alcohol use  Occasional alcohol use, Recently quit alcohol use. Caffeine use  Carbonated beverages, Coffee, Tea. No drug use  Tobacco use  Current some day smoker.  Family History , MD; 08/27/2020 10:37 AM) First Degree Relatives  No pertinent family history  Heart disease in female family member before age 42   Pregnancy / Birth History , MD; 08/27/2020 10:37 AM) Age at menarche  10 years. Age of menopause  41-50 Gravida  2 3 Irregular periods  Maternal age   63-25 Para  2 Contraceptive History  Oral contraceptives.  Other Problems 08/29/2020, MD; 08/27/2020 10:37 AM) Cerebrovascular Accident  Chest pain  Congestive Heart Failure  Myocardial infarction  Hemorrhoids  High blood pressure      Review of Systems (Alisha Spillers CMA; 08/27/2020 9:58 AM) General Not Present- Appetite Loss, Chills, Fatigue, Fever, Night Sweats, Weight Gain and Weight Loss. Skin Not Present- Change in Wart/Mole, Dryness, Hives, Jaundice, New Lesions, Non-Healing Wounds, Rash and Ulcer. HEENT Not Present- Earache, Hearing Loss, Hoarseness, Nose Bleed, Oral Ulcers, Ringing in the Ears, Seasonal Allergies, Sinus Pain, Sore Throat, Visual Disturbances, Wears glasses/contact lenses and Yellow Eyes. Respiratory Not Present- Bloody sputum, Chronic Cough, Difficulty Breathing, Snoring and Wheezing. Breast Not Present- Breast Mass, Breast Pain, Nipple Discharge and Skin Changes. Cardiovascular Not Present- Chest Pain, Difficulty Breathing Lying Down, Leg Cramps, Palpitations, Rapid Heart Rate, Shortness of Breath and Swelling of Extremities. Female Genitourinary Not Present- Frequency, Nocturia, Painful Urination, Pelvic Pain and Urgency. Musculoskeletal Not Present- Back Pain, Joint Pain, Joint Stiffness, Muscle Pain, Muscle Weakness and Swelling of Extremities. Neurological Not Present- Decreased Memory, Fainting, Headaches, Numbness, Seizures, Tingling, Tremor, Trouble walking and Weakness. Psychiatric Not Present- Anxiety, Bipolar, Change in Sleep Pattern, Depression, Fearful and Frequent crying. Endocrine Not Present- Cold Intolerance, Excessive Hunger, Hair Changes, Heat Intolerance, Hot flashes and New Diabetes. Hematology Present- Blood Thinners. Not Present- Easy Bruising, Excessive bleeding, Gland problems, HIV and Persistent Infections.  Vitals 53 Spillers CMA; 08/27/2020 9:59 AM) 08/27/2020 9:58  AM Weight: 180.2 lb   Height: 66 in  Body Surface Area:  1.91 m   Body Mass Index: 29.08 kg/m   Pulse: 100 (Regular)    BP: 110/68(Sitting, Left Arm, Standard)        Physical Exam Ardeth Sportsman MD; 08/27/2020 10:25 AM)  General Mental Status - Alert. General Appearance - Not in acute distress, Not Sickly. Orientation - Oriented X3. Hydration - Well hydrated. Voice - Normal.  Integumentary Global Assessment Upon inspection and palpation of skin surfaces of the - Axillae: non-tender, no inflammation or ulceration, no drainage. and Distribution of scalp and body hair is normal. General Characteristics Temperature - normal warmth is noted.  Head and Neck Head - normocephalic, atraumatic with no lesions or palpable masses. Face Global Assessment - atraumatic, no absence of expression. Neck Global Assessment - no abnormal movements, no bruit auscultated on the right, no bruit auscultated on the left, no decreased range of motion, non-tender. Trachea - midline. Thyroid Gland Characteristics - non-tender.  Eye Eyeball - Left - Extraocular movements intact, No Nystagmus - Left. Eyeball - Right - Extraocular movements intact, No Nystagmus - Right. Cornea - Left - No Hazy - Left. Cornea - Right - No Hazy - Right. Sclera/Conjunctiva - Left - No scleral icterus, No Discharge - Left. Sclera/Conjunctiva - Right - No scleral icterus, No Discharge - Right. Pupil - Left - Direct reaction to light normal. Pupil - Right - Direct reaction to light normal.  ENMT Ears Pinna - Left - no drainage observed, no generalized tenderness observed. Pinna - Right - no drainage observed, no generalized tenderness observed. Nose and Sinuses External Inspection of the Nose - no destructive lesion observed. Inspection of the nares - Left - quiet respiration. Inspection of the nares - Right - quiet respiration. Mouth and Throat Lips - Upper Lip - no fissures observed, no pallor noted. Lower Lip - no fissures observed, no pallor noted. Nasopharynx - no  discharge present. Oral Cavity/Oropharynx - Tongue - no dryness observed. Oral Mucosa - no cyanosis observed. Hypopharynx - no evidence of airway distress observed.  Chest and Lung Exam Inspection Movements - Normal and Symmetrical. Accessory muscles - No use of accessory muscles in breathing. Palpation Palpation of the chest reveals - Non-tender. Auscultation Breath sounds - Normal and Clear.  Cardiovascular Auscultation Rhythm - Regular. Murmurs & Other Heart Sounds - Auscultation of the heart reveals - No Murmurs and No Systolic Clicks.  Abdomen Inspection Inspection of the abdomen reveals - No Visible peristalsis and No Abnormal pulsations. Umbilicus - No Bleeding, No Urine drainage. Palpation/Percussion Palpation and Percussion of the abdomen reveal - Soft, Non Tender, No Rebound tenderness, No Rigidity (guarding) and No Cutaneous hyperesthesia. Note:  Abdomen soft.  Not severely distended.  Mild suprabuccal diastases recti. Super umbilical piercing with area clean. No obvious hernia.  No umbilical or other anterior abdominal wall hernias  Female Genitourinary Sexual Maturity Tanner 5 - Adult hair pattern. Note:  No vaginal bleeding nor discharge. Normal external female genitalia. No inguinal hernias or lymphadenopathy. No condyloma or hydradenitis.  Rectal Note:  #########################   Right posterior chronically prolapsed hemorrhoid with external tag. Right anterior external hemorrhoid tag as well.  Perianal skin clean with good hygiene. No pruritis ani. No pilonidal disease. No fissure. No abscess/fistula. Normal sphincter tone. No condyloma warts.  Tolerates digital and anoscopic rectal exam. No rectal masses. Hemorrhoidal piles at least grade 2 right anterior and left lateral. Right posterior grade 4  Exam done  with assistance of female Medical Assistant in the room.  Peripheral Vascular Upper Extremity Inspection - Left - No Cyanotic nailbeds - Left, Not  Ischemic. Inspection - Right - No Cyanotic nailbeds - Right, Not Ischemic.  Neurologic Neurologic evaluation reveals  - normal attention span and ability to concentrate, able to name objects and repeat phrases. Appropriate fund of knowledge , normal sensation and normal coordination. Mental Status Affect - not angry, not paranoid. Cranial Nerves - Normal Bilaterally. Gait - Normal.  Neuropsychiatric Mental status exam performed with findings of - able to articulate well with normal speech/language, rate, volume and coherence, thought content normal with ability to perform basic computations and apply abstract reasoning and no evidence of hallucinations, delusions, obsessions or homicidal/suicidal ideation.  Musculoskeletal Global Assessment Spine, Ribs and Pelvis - no instability, subluxation or laxity. Right Upper Extremity - no instability, subluxation or laxity.  Lymphatic Head & Neck  General Head & Neck Lymphatics: Bilateral - Description - No Localized lymphadenopathy. Axillary  General Axillary Region: Bilateral - Description - No Localized lymphadenopathy. Femoral & Inguinal  Generalized Femoral & Inguinal Lymphatics: Left - Description - No Localized lymphadenopathy. Right - Description - No Localized lymphadenopathy.   Results Ardeth Sportsman MD; 08/27/2020 10:34 AM) Procedures  Name Value Date Hemorrhoids Procedure   Anal exam: External Hemorrhoid   Internal exam:   Internal Hemorroids ( non-bleeding)   prolapse   Other: #########################.................Marland KitchenRight posterior chronically prolapsed hemorrhoid with external tag.  Right anterior external hemorrhoid tag as well............Marland KitchenPerianal skin clean with good hygiene.  No pruritis ani.  No pilonidal disease.  No fissure.  No abscess/fistula.  Normal sphincter tone.  ....Marland KitchenMarland KitchenNo condyloma warts............Marland KitchenTolerates digital and anoscopic rectal exam. No rectal masses.  Hemorrhoidal piles at  least grade 2 right anterior and left lateral.  Right posterior grade 4 ...........Marland KitchenExam done with assistance of female Medical Assistant in the room.  Performed: 08/27/2020 10:25 AM    Assessment & Plan Ardeth Sportsman MD; 08/27/2020 10:34 AM)  PROLAPSED INTERNAL HEMORRHOIDS, GRADE 4 (K64.3) Impression: Worsening hemorrhoids now one right posterior chronically prolapsed with source of bleeding on full anticoagulation.  Her bowels are a little hard been in a decent range with no obvious evidence of malignancy and colonoscopy 6 years ago.  I think she would benefit from surgical removal since since a worsening problem despite being compliant. Should be outpatient surgery. Internal hemorrhoid ligation/pexied hemorrhoidectomy.  Need to make sure it safe for her to come off her anticoagulation given her history of stroke and myocardial infarction. Get cardiac clearance to be safe. I'm okay with at least aspirin.  I strongly recommend she quit smoking since she is at high risk for recurrent stroke and heart attack if she smokes. She also wound healing problems on smokers. She is working on it she claims.  Current Plans ANOSCOPY, DIAGNOSTIC (40981) The anatomy & physiology of the anorectal region was discussed.  The pathophysiology of hemorrhoids and differential diagnosis was discussed.  Natural history risks without surgery was discussed.   I stressed the importance of a bowel regimen to have daily soft bowel movements to minimize progression of disease.  Interventions such as sclerotherapy & banding were discussed.  The patient's symptoms are not adequately controlled by medicines and other non-operative treatments.  I feel the risks & problems of no surgery outweigh the operative risks; therefore, I recommended surgery to treat the hemorrhoids by ligation, pexy, and possible resection.   Risks such as bleeding, infection, urinary difficulties, need for further  treatment, heart attack,  death, and other risks were discussed.   I noted a good likelihood this will help address the problem.  Goals of post-operative recovery were discussed as well.  Possibility that this will not correct all symptoms was explained.  Post-operative pain, bleeding, constipation, and other problems after surgery were discussed.  We will work to minimize complications.   Educational handouts further explaining the pathology, treatment options, and bowel regimen were given as well.  Questions were answered.  The patient expresses understanding & wishes to proceed with surgery.   PROLAPSED INTERNAL HEMORRHOIDS, GRADE 2 (K64.1) Impression: The anatomy & physiology of the anorectal region was discussed. The pathophysiology of hemorrhoids and differential diagnosis was discussed. Natural history progression was discussed. I stressed the importance of a bowel regimen to have daily soft bowel movements to minimize progression of disease. Goal of one BM / day ideal. Use of wet wipes, warm baths, avoiding straining, etc were emphasized.  Educational handouts further explaining the pathology, treatment options, and bowel regimen were given as well. The patient expressed understanding.  Current Plans Pt Education - [facility] Hemorrhoids (Brehanna Deveny): discussed with patient and provided information. Pt Education - Pamphlet Given - The Hemorrhoid Book: discussed with patient and provided information.  TOBACCO ABUSE (Z72.0) Impression: STOP SMOKING!  We talked to the patient about the dangers of smoking. We stressed that tobacco use dramatically increases the risk of peri-operative complications such as infection, tissue necrosis leaving to problems with incision/wound and organ healing, hernia, chronic pain, heart attack, stroke, DVT, pulmonary embolism, and death. We noted there are programs in our community to help stop smoking. Information was available.  Current Plans Pt Education - [facility] STOP SMOKING!  BLEEDING HEMORRHOID  (K64.9)   ENCOUNTER FOR PREOPERATIVE EXAMINATION FOR GENERAL SURGICAL PROCEDURE (Z01.818)  Current Plans You are being scheduled for surgery - Our schedulers will call you.   You should hear from our office's scheduling department within 5 working days about the location, date, and time of surgery.  We try to make accommodations for patient's preferences in scheduling surgery, but sometimes the OR schedule or the surgeon's schedule prevents us from making those accommodations.  If you have not heard from our office [phone]) in 5 working days, call the office and ask for your surgeon's nurse.  If you have other questions about your diagnosis, plan, or surgery, call the office and ask for your surgeon's nurse.  Pt Education - [facility] Rectal Prep for Anorectal outpatient/office surgery: discussed with patient and provided information. Pt Education - [facility] Rectal Surgery HCI (Keandra Medero): discussed with patient and provided information. Pt Education - [facility] Good Bowel Health (Kem Hensen)  HISTORY OF STROKE (Z86.73)   HISTORY OF MI (MYOCARDIAL INFARCTION) (I25.2)   ANTICOAGULATED (Z79.01)  Current Plans I recommended obtaining preoperative cardiac clearance.  I am concerned about the health of the patient and the ability to tolerate the operation.  Therefore, we will request clearance by cardiology to better assess operative risk & see if a reevaluation, further workup, etc is needed.  Also recommendations on how medications such as for anticoagulation and blood pressure should be managed/held/restarted after surgery. Pt Education - [facility] Hold anticoagulation preoperatively    Ardeth SportsmanSteven C. Ellie Spickler, MD, FACS, MASCRS Esophageal, Gastrointestinal & Colorectal Surgery Robotic and Minimally Invasive Surgery  [facility] [address] [place], [place] [id number] [phone] Fax [phone] Main/Paging  CONTACT INFORMATION: Weekday (9AM-5PM) concerns: Call [facility] main office  at [phone] Weeknight (5PM-9AM) or  Weekend/Holiday concerns: Check [email or web address] for General Surgery [facility] coverage (Please, do not use SecureChat as it is not reliable communication to operating surgeons for immediate patient care)

## 2020-08-28 NOTE — Progress Notes (Signed)
HPI: FU CAD; s/p apical infarct in 2000 and NSTEMI in 2009 tx with BMS to LAD, prior CVA, ? Anti-thrombin III deficiency, HTN, HL.  Echo 5/15 demonstrated apical mural thrombus.  Placed on coumadin.  ASA and Plavix were d/c'd. Monitor 12/15 showed sinus to sinus tachycardia with PVCs. Cardiac catheterization November 2016 showed no obstructive coronary disease. There was mild LV dysfunction with akinesis of the apex and note of an apical thrombus. Cardiac MRI November 2016 showed ejection fraction 50% with apical akinesis. There was an apical thrombus noted. Mild mitral regurgitation. Echo 4/19 showed EF 55-60, apical akinesis, apical thrombus and gradient 1 diastolic dysfunction. CTA 4/19 showed patent carotid and vertebral arteries. Nuclear study 7/19 showed EF 54, apical scar, no ischemia. Since last seen, patient denies dyspnea, chest pain, palpitations or syncope.  No bleeding.  Current Outpatient Medications  Medication Sig Dispense Refill   acetaminophen (TYLENOL) 325 MG tablet Take 1-2 tablets (325-650 mg total) by mouth every 4 (four) hours as needed for mild pain.     amLODipine (NORVASC) 10 MG tablet Take 1 tablet (10 mg total) by mouth daily. 30 tablet 3   atorvastatin (LIPITOR) 40 MG tablet Take 1 tablet (40 mg total) by mouth daily. 30 tablet 0   carvedilol (COREG) 25 MG tablet Take 1 tablet (25 mg total) by mouth 2 (two) times daily with a meal. 60 tablet 0   clopidogrel (PLAVIX) 75 MG tablet Take 1 tablet (75 mg total) by mouth daily. 90 tablet 4   hydrochlorothiazide (HYDRODIURIL) 25 MG tablet Take 0.5 tablets (12.5 mg total) by mouth daily. 30 tablet 3   nitroGLYCERIN (NITROSTAT) 0.4 MG SL tablet Place 1 tablet (0.4 mg total) under the tongue every 5 (five) minutes as needed for chest pain (up to 3 doses). 15 tablet 0   pantoprazole (PROTONIX) 40 MG tablet Take 1 tablet (40 mg total) by mouth 2 (two) times daily. 60 tablet 0   rivaroxaban (XARELTO) 20 MG TABS tablet Take 1  tablet (20 mg total) by mouth daily. 30 tablet 0   No current facility-administered medications for this visit.     Past Medical History:  Diagnosis Date   Anemia    Antithrombin III deficiency (HCC)    ?pt not sure if true diagnosis   Anxiety    Apical mural thrombus    Blood transfusion    Cholecystitis 07/2016   Coronary artery disease    a. apical LAD infarction '00. b. NSTEMI s/p BMS to prox LAD '09. c. Cath 01/2015: stable LAD stent, otherwise minimal nonobstructive CAD.   GERD (gastroesophageal reflux disease)    Hyperlipidemia    Hypertension    Myocardial infarction (HCC)    Noncompliance with medication regimen    a. h/o noncompliance with med regimen (previous running out of Coumadin).   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   Stroke (HCC)    assoc with short term memory loss and right peripheral vision loss; age 50    TIA (transient ischemic attack) 2010   Tobacco abuse     Past Surgical History:  Procedure Laterality Date   CARDIAC CATHETERIZATION     CARDIAC CATHETERIZATION N/A 01/29/2015   Procedure: Left Heart Cath and Coronary Angiography;  Surgeon: Peter M Swaziland, MD;  Location: [facility] INVASIVE CV LAB;  Service: Cardiovascular;  Laterality: N/A;   CHOLECYSTECTOMY N/A 08/14/2016   Procedure: LAPAROSCOPIC CHOLECYSTECTOMY;  Surgeon: Gaynelle Adu, MD;  Location: [facility] OR;  Service: General;  Laterality: N/A;   COLONOSCOPY N/A 03/29/2014   Procedure: COLONOSCOPY;  Surgeon: Louis Meckel, MD;  Location: [facility] ENDOSCOPY;  Service: Endoscopy;  Laterality: N/A;   CORONARY ANGIOPLASTY     ESOPHAGOGASTRODUODENOSCOPY N/A 06/09/2012   Procedure: ESOPHAGOGASTRODUODENOSCOPY (EGD);  Surgeon: Theda Belfast, MD;  Location: [facility] ENDOSCOPY;  Service: Endoscopy;  Laterality: N/A;   ESOPHAGOGASTRODUODENOSCOPY N/A 08/02/2013   Procedure: ESOPHAGOGASTRODUODENOSCOPY (EGD);  Surgeon: Meryl Dare, MD;  Location: [facility] ENDOSCOPY;  Service: Endoscopy;   Laterality: N/A;   IR ANGIO INTRA EXTRACRAN SEL COM CAROTID INNOMINATE UNI R MOD SED  07/08/2017   LEFT HEART CATHETERIZATION WITH CORONARY ANGIOGRAM N/A 12/24/2011   Procedure: LEFT HEART CATHETERIZATION WITH CORONARY ANGIOGRAM;  Surgeon: Kathleene Hazel, MD;  Location: [facility] CATH LAB;  Service: Cardiovascular;  Laterality: N/A;   RADIOLOGY WITH ANESTHESIA N/A 07/08/2017   Procedure: IR WITH ANESTHESIA;  Surgeon: Radiologist, Medication, MD;  Location: [facility] OR;  Service: Radiology;  Laterality: N/A;   TUBAL LIGATION      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  Tobacco Use   Smoking status: Former    Packs/day: 0.20    Years: 1.00    Pack years: 0.20    Types: Cigarettes    Quit date: 10/27/2018    Years since quitting: 1.8   Smokeless tobacco: Never   Tobacco comments:    2018  " i STILL SMOKE 3  CIGARETTES A DAY "  Vaping Use   Vaping Use: Former  Substance and Sexual Activity   Alcohol use: Yes    Alcohol/week: 0.0 standard drinks    Comment: Social drinker   Drug use: Not Currently    Types: Cocaine    Comment: hx of use x 1 in the past   Sexual activity: Not Currently    Birth control/protection: Surgical  Other Topics Concern   Not on file  Social History Narrative   Not on file   Social Determinants of Health   Financial Resource Strain: Not on file  Food Insecurity: Not on file  Transportation Needs: Not on file  Physical Activity: Not on file  Stress: Not on file  Social Connections: Not on file  Intimate Partner Violence: Not on file    Family History  Problem Relation Age of Onset   Stroke Father    Heart disease Brother        arrhythmia; died   Breast cancer Maternal Aunt     ROS: no fevers or chills, productive  cough, hemoptysis, dysphasia, odynophagia, melena, hematochezia, dysuria, hematuria, rash, seizure activity, orthopnea, PND, pedal edema, claudication. Remaining systems are negative.  Physical Exam: Well-developed well-nourished in no acute distress.  Skin is warm and dry.  HEENT is normal.  Neck is supple.  Chest is clear to auscultation with normal expansion.  Cardiovascular exam is regular rate and rhythm.  Abdominal exam nontender or distended. No masses palpated. Extremities show no edema. neuro grossly intact  ECG-normal sinus rhythm at a rate of 68, anterior infarct.  Personally reviewed  A/P  1 Apical mural thrombus-Pt with h/o CVA; continue xarelto (coumadin DCed previously as she was noncompliant with INR checks).  Check hemoglobin.  2 CAD with  prior PCI of LAD-patient wishes to continue Plavix.  Plan to continue statin.  She has not had chest pain.  3 H/O palpitations-no recent symptoms.  Continue beta-blocker.  4 hypertension-blood pressure controlled.  Continue present medications and follow-up.  Check potassium and renal function.  5 hyperlipidemia-check lipids and liver.  Continue statin.  6 Antithrombin III deficiency-continue Xarelto.  Olga Millers, MD

## 2020-08-29 ENCOUNTER — Encounter: Payer: Self-pay | Admitting: Cardiology

## 2020-08-29 ENCOUNTER — Other Ambulatory Visit: Payer: Self-pay

## 2020-08-29 ENCOUNTER — Ambulatory Visit (INDEPENDENT_AMBULATORY_CARE_PROVIDER_SITE_OTHER): Payer: Medicare (Managed Care) | Admitting: Cardiology

## 2020-08-29 VITALS — BP 115/80 | HR 68 | Ht 66.0 in | Wt 177.1 lb

## 2020-08-29 DIAGNOSIS — E7800 Pure hypercholesterolemia, unspecified: Secondary | ICD-10-CM

## 2020-08-29 DIAGNOSIS — I513 Intracardiac thrombosis, not elsewhere classified: Secondary | ICD-10-CM

## 2020-08-29 DIAGNOSIS — R002 Palpitations: Secondary | ICD-10-CM

## 2020-08-29 DIAGNOSIS — I2510 Atherosclerotic heart disease of native coronary artery without angina pectoris: Secondary | ICD-10-CM

## 2020-08-29 DIAGNOSIS — I10 Essential (primary) hypertension: Secondary | ICD-10-CM

## 2020-08-29 NOTE — Patient Instructions (Signed)
  Lab Work:  Your physician recommends that you return for lab work FASTING  If you have labs (blood work) drawn today and your tests are completely normal, you will receive your results only by: . MyChart Message (if you have MyChart) OR . A paper copy in the mail If you have any lab test that is abnormal or we need to change your treatment, we will call you to review the results.   Follow-Up: At [facility], you and your health needs are our priority.  As part of our continuing mission to provide you with exceptional heart care, we have created designated Provider Care Teams.  These Care Teams include your primary Cardiologist (physician) and Advanced Practice Providers (APPs -  Physician Assistants and Nurse Practitioners) who all work together to provide you with the care you need, when you need it.  We recommend signing up for the patient portal called "MyChart".  Sign up information is provided on this After Visit Summary.  MyChart is used to connect with patients for Virtual Visits (Telemedicine).  Patients are able to view lab/test results, encounter notes, upcoming appointments, etc.  Non-urgent messages can be sent to your provider as well.   To learn more about what you can do with MyChart, go to [email or web address].    Your next appointment:   12 month(s)  The format for your next appointment:   In Person  Provider:   Brian Crenshaw, MD    

## 2020-08-30 ENCOUNTER — Telehealth: Payer: Self-pay | Admitting: *Deleted

## 2020-08-30 NOTE — Telephone Encounter (Signed)
   [facility] Pre-operative Risk Assessment    Patient Name: Lindsey [name]  DOB: [birth year]  MRN: [id number]   HEARTCARE STAFF: - Please ensure there is not already an duplicate clearance open for this procedure. - Under Visit Info/Reason for Call, type in Other and utilize the format Clearance MM/DD/YY or Clearance TBD. Do not use dashes or single digits. - If request is for dental extraction, please clarify the # of teeth to be extracted. - If the patient is currently at the dentist's office, call Pre-Op APP to address. If the patient is not currently in the dentist office, please route to the Pre-Op pool  Request for surgical clearance:  What type of surgery is being performed? HEMORRHOIDECTOMY    When is this surgery scheduled? TBD   What type of clearance is required (medical clearance vs. Pharmacy clearance to hold med vs. Both)? BOTH  Are there any medications that need to be held prior to surgery and how long? [place]   Practice name and name of physician performing surgery? [facility]; DR. Remo Lipps GROSS   What is the office phone number? [phone]   7.   What is the office fax number? [place], CMA  8.   Anesthesia type (None, local, MAC, general) ? GENERAL   Julaine Hua 08/30/2020, 4:36 PM  _________________________________________________________________   (provider comments below)

## 2020-08-31 NOTE — Telephone Encounter (Signed)
Pt on Xarelto with history of antithrombin III deficiency, apical mural thrombus, and CVA. Cardiology does not prescribe Xarelto for pt or manage her antithrombin III deficiency. Recommend clearance come from managing MD.

## 2021-12-13 ENCOUNTER — Emergency Department (HOSPITAL_COMMUNITY): Payer: Medicare Other

## 2021-12-13 ENCOUNTER — Emergency Department (HOSPITAL_COMMUNITY)
Admission: EM | Admit: 2021-12-13 | Discharge: 2021-12-14 | Disposition: A | Discharge status: Home / Self Care | Payer: Medicare Other | Attending: Emergency Medicine | Admitting: Emergency Medicine

## 2021-12-13 ENCOUNTER — Other Ambulatory Visit: Payer: Self-pay

## 2021-12-13 ENCOUNTER — Encounter (HOSPITAL_COMMUNITY): Payer: Self-pay | Admitting: Emergency Medicine

## 2021-12-13 DIAGNOSIS — I700 Atherosclerosis of aorta: Secondary | ICD-10-CM | POA: Diagnosis not present

## 2021-12-13 DIAGNOSIS — Z23 Encounter for immunization: Secondary | ICD-10-CM | POA: Diagnosis not present

## 2021-12-13 DIAGNOSIS — Z20822 Contact with and (suspected) exposure to covid-19: Secondary | ICD-10-CM | POA: Diagnosis not present

## 2021-12-13 DIAGNOSIS — M47812 Spondylosis without myelopathy or radiculopathy, cervical region: Secondary | ICD-10-CM | POA: Diagnosis not present

## 2021-12-13 DIAGNOSIS — S301XXA Contusion of abdominal wall, initial encounter: Secondary | ICD-10-CM | POA: Diagnosis not present

## 2021-12-13 DIAGNOSIS — S61210A Laceration without foreign body of right index finger without damage to nail, initial encounter: Secondary | ICD-10-CM | POA: Insufficient documentation

## 2021-12-13 DIAGNOSIS — Y92410 Unspecified street and highway as the place of occurrence of the external cause: Secondary | ICD-10-CM | POA: Diagnosis not present

## 2021-12-13 DIAGNOSIS — S0990XA Unspecified injury of head, initial encounter: Secondary | ICD-10-CM | POA: Diagnosis not present

## 2021-12-13 DIAGNOSIS — S6991XA Unspecified injury of right wrist, hand and finger(s), initial encounter: Secondary | ICD-10-CM | POA: Diagnosis present

## 2021-12-13 DIAGNOSIS — M79641 Pain in right hand: Secondary | ICD-10-CM | POA: Insufficient documentation

## 2021-12-13 LAB — CBC
HCT: 39.4 % (ref 36.0–46.0)
Hemoglobin: 12.6 g/dL (ref 12.0–15.0)
MCH: 27.8 pg (ref 26.0–34.0)
MCHC: 32 g/dL (ref 30.0–36.0)
MCV: 87 fL (ref 80.0–100.0)
Platelets: 198 10*3/uL (ref 150–400)
RBC: 4.53 MIL/uL (ref 3.87–5.11)
RDW: 13.6 % (ref 11.5–15.5)
WBC: 9.9 10*3/uL (ref 4.0–10.5)
nRBC: 0 % (ref 0.0–0.2)

## 2021-12-13 LAB — I-STAT CHEM 8, ED
BUN: 11 mg/dL (ref 6–20)
Calcium, Ion: 1.21 mmol/L (ref 1.15–1.40)
Chloride: 108 mmol/L (ref 98–111)
Creatinine, Ser: 0.7 mg/dL (ref 0.44–1.00)
Glucose, Bld: 111 mg/dL — ABNORMAL HIGH (ref 70–99)
HCT: 40 % (ref 36.0–46.0)
Hemoglobin: 13.6 g/dL (ref 12.0–15.0)
Potassium: 3.9 mmol/L (ref 3.5–5.1)
Sodium: 143 mmol/L (ref 135–145)
TCO2: 24 mmol/L (ref 22–32)

## 2021-12-13 LAB — I-STAT BETA HCG BLOOD, ED (MC, WL, AP ONLY): I-stat hCG, quantitative: <5 m[IU]/mL (ref <5)

## 2021-12-13 MED ORDER — ONDANSETRON HCL 4 MG/2ML IJ SOLN
4.0000 mg | Freq: Once | INTRAMUSCULAR | Status: AC
  Administered 2021-12-13: 4 mg via INTRAVENOUS
  Filled 2021-12-13: qty 2

## 2021-12-13 MED ORDER — MORPHINE SULFATE (PF) 4 MG/ML IV SOLN
4.0000 mg | Freq: Once | INTRAVENOUS | Status: AC
  Administered 2021-12-13: 4 mg via INTRAVENOUS
  Filled 2021-12-13: qty 1

## 2021-12-13 MED ORDER — LIDOCAINE HCL (PF) 1 % IJ SOLN
30.0000 mL | Freq: Once | INTRAMUSCULAR | Status: AC
  Administered 2021-12-14: 30 mL
  Filled 2021-12-13: qty 30

## 2021-12-13 MED ORDER — TETANUS-DIPHTH-ACELL PERTUSSIS 5-2.5-18.5 LF-MCG/0.5 IM SUSY
0.5000 mL | PREFILLED_SYRINGE | Freq: Once | INTRAMUSCULAR | Status: AC
  Administered 2021-12-13: 0.5 mL via INTRAMUSCULAR
  Filled 2021-12-13: qty 0.5

## 2021-12-13 NOTE — ED Provider Notes (Signed)
  Physical Exam  BP 130/70 (BP Location: Left Arm)   Pulse 73   Temp 98.7 F (37.1 C) (Oral)   Resp (!) 22   Ht 5\' 6"  (1.676 m)   Wt 84.4 kg   SpO2 97%   BMI 30.02 kg/m   Physical Exam R index finger with  Procedures  .Ortho Injury Treatment  Date/Time: 12/13/2021 11:40 PM  Performed by: Veatrice Kells, MD Authorized by: Veatrice Kells, MD   Consent:    Consent obtained:  Verbal   Consent given by:  Patient   Risks discussed:  Irreducible dislocation   Alternatives discussed:  No treatmentInjury location: index finger R. Pre-procedure distal perfusion: diminished Pre-procedure neurological function: diminished Pre-procedure range of motion: reduced  Anesthesia: Local anesthesia used: no  Patient sedated: NoImmobilization: splint Splint type: static finger Splint Applied by: ED Tech Supplies used: aluminum splint Post-procedure distal perfusion: normal Post-procedure neurological function: normal Post-procedure range of motion: improved     ED Course / MDM   Clinical Course as of 12/13/21 2340  Fri Dec 13, 2021  2307 Upgraded to Level 2 per department protocol and MOI. [RB]    Clinical Course User Index [RB] Valora Corporal, Carlas Vandyne, MD 12/13/21 2342
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
## 2021-12-14 ENCOUNTER — Emergency Department (HOSPITAL_COMMUNITY): Payer: Medicare Other

## 2021-12-14 DIAGNOSIS — S61210A Laceration without foreign body of right index finger without damage to nail, initial encounter: Secondary | ICD-10-CM | POA: Diagnosis not present

## 2021-12-14 LAB — SAMPLE TO BLOOD BANK

## 2021-12-14 LAB — PROTIME-INR
INR: 1.1 (ref 0.8–1.2)
Prothrombin Time: 14.5 seconds (ref 11.4–15.2)

## 2021-12-14 LAB — COMPREHENSIVE METABOLIC PANEL
ALT: 25 U/L (ref 0–44)
AST: 32 U/L (ref 15–41)
Albumin: 4 g/dL (ref 3.5–5.0)
Alkaline Phosphatase: 84 U/L (ref 38–126)
Anion gap: 10 (ref 5–15)
BUN: 9 mg/dL (ref 6–20)
CO2: 22 mmol/L (ref 22–32)
Calcium: 9.8 mg/dL (ref 8.9–10.3)
Chloride: 110 mmol/L (ref 98–111)
Creatinine, Ser: 0.75 mg/dL (ref 0.44–1.00)
GFR, Estimated: >60 mL/min (ref >60)
Glucose, Bld: 117 mg/dL — ABNORMAL HIGH (ref 70–99)
Potassium: 3.9 mmol/L (ref 3.5–5.1)
Sodium: 142 mmol/L (ref 135–145)
Total Bilirubin: 0.6 mg/dL (ref 0.3–1.2)
Total Protein: 7.2 g/dL (ref 6.5–8.1)

## 2021-12-14 LAB — ETHANOL: Alcohol, Ethyl (B): <10 mg/dL (ref <10)

## 2021-12-14 LAB — LACTIC ACID, PLASMA: Lactic Acid, Venous: 0.9 mmol/L (ref 0.5–1.9)

## 2021-12-14 LAB — RESP PANEL BY RT-PCR (FLU A&B, COVID) ARPGX2
Influenza A by PCR: NEGATIVE
Influenza B by PCR: NEGATIVE
SARS Coronavirus 2 by RT PCR: NEGATIVE

## 2021-12-14 MED ORDER — HYDROMORPHONE HCL 1 MG/ML IJ SOLN
0.5000 mg | Freq: Once | INTRAMUSCULAR | Status: AC
  Administered 2021-12-14: 0.5 mg via INTRAVENOUS
  Filled 2021-12-14: qty 1

## 2021-12-14 MED ORDER — IOHEXOL 350 MG/ML SOLN
75.0000 mL | Freq: Once | INTRAVENOUS | Status: AC | PRN
  Administered 2021-12-14: 75 mL via INTRAVENOUS

## 2021-12-14 MED ORDER — HYDROCODONE-ACETAMINOPHEN 5-325 MG PO TABS: 1.0000 | ORAL_TABLET | Freq: Four times a day (QID) | ORAL | 0 refills | Status: DC | PRN

## 2021-12-14 NOTE — Discharge Instructions (Addendum)
Please follow-up with the hand doctor listed.  I spoke with him on the phone tonight.  He asked that you call his office on Monday to make an appointment.  Take medications as prescribed.  Keep the hand up and elevated.  Apply ice several times per day.

## 2021-12-14 NOTE — Progress Notes (Signed)
Orthopedic Tech Progress Note Patient Details:  Mae Denunzio [facility] 1970/05/18 [id number]  Ortho Devices Type of Ortho Device: Velcro wrist splint, Finger splint Ortho Device/Splint Location: RUE Ortho Device/Splint Interventions: Ordered, Application, Adjustment   Post Interventions Patient Tolerated: Poor I was able to apply the wrist brace, but finger splint was a bit much for her she couldn't tolerate it at the moment told pt when she feels like she can do it let nurse know and I will come back. Berenice Primas Merdith Boyd 12/14/2021, 5:26 AM

## 2022-02-04 ENCOUNTER — Emergency Department (HOSPITAL_COMMUNITY): Payer: Medicare Other

## 2022-02-04 ENCOUNTER — Emergency Department (HOSPITAL_COMMUNITY)
Admission: EM | Admit: 2022-02-04 | Discharge: 2022-02-04 | Disposition: A | Discharge status: Home / Self Care | Payer: Medicare Other | Attending: Emergency Medicine | Admitting: Emergency Medicine

## 2022-02-04 DIAGNOSIS — M545 Low back pain, unspecified: Secondary | ICD-10-CM | POA: Insufficient documentation

## 2022-02-04 DIAGNOSIS — R0602 Shortness of breath: Secondary | ICD-10-CM | POA: Diagnosis not present

## 2022-02-04 DIAGNOSIS — Z79899 Other long term (current) drug therapy: Secondary | ICD-10-CM | POA: Diagnosis not present

## 2022-02-04 DIAGNOSIS — R079 Chest pain, unspecified: Secondary | ICD-10-CM

## 2022-02-04 DIAGNOSIS — Z7901 Long term (current) use of anticoagulants: Secondary | ICD-10-CM | POA: Insufficient documentation

## 2022-02-04 DIAGNOSIS — Z7902 Long term (current) use of antithrombotics/antiplatelets: Secondary | ICD-10-CM | POA: Diagnosis not present

## 2022-02-04 LAB — CBC
HCT: 39.1 % (ref 36.0–46.0)
Hemoglobin: 12.5 g/dL (ref 12.0–15.0)
MCH: 27.6 pg (ref 26.0–34.0)
MCHC: 32 g/dL (ref 30.0–36.0)
MCV: 86.3 fL (ref 80.0–100.0)
Platelets: 240 10*3/uL (ref 150–400)
RBC: 4.53 MIL/uL (ref 3.87–5.11)
RDW: 13.4 % (ref 11.5–15.5)
WBC: 8.7 10*3/uL (ref 4.0–10.5)
nRBC: 0 % (ref 0.0–0.2)

## 2022-02-04 LAB — TROPONIN I (HIGH SENSITIVITY): Troponin I (High Sensitivity): 5 ng/L (ref <18)

## 2022-02-04 LAB — BASIC METABOLIC PANEL
Anion gap: 10 (ref 5–15)
BUN: 9 mg/dL (ref 6–20)
CO2: 24 mmol/L (ref 22–32)
Calcium: 9.5 mg/dL (ref 8.9–10.3)
Chloride: 108 mmol/L (ref 98–111)
Creatinine, Ser: 0.67 mg/dL (ref 0.44–1.00)
GFR, Estimated: >60 mL/min (ref >60)
Glucose, Bld: 120 mg/dL — ABNORMAL HIGH (ref 70–99)
Potassium: 4.2 mmol/L (ref 3.5–5.1)
Sodium: 142 mmol/L (ref 135–145)

## 2022-02-04 MED ORDER — ACETAMINOPHEN 500 MG PO TABS
1000.0000 mg | ORAL_TABLET | Freq: Once | ORAL | Status: AC
  Administered 2022-02-04: 1000 mg via ORAL
  Filled 2022-02-04: qty 2

## 2022-02-04 MED ORDER — DICLOFENAC SODIUM 1 % EX GEL: 4.0000 g | Freq: Four times a day (QID) | CUTANEOUS | 0 refills | Status: DC

## 2022-02-04 NOTE — ED Provider Notes (Signed)
[facility] EMERGENCY DEPARTMENT Provider Note   CSN: [id number] Arrival date & time: 02/04/22  6578     History  Chief Complaint  Patient presents with   Chest Pain   Shortness of Breath   Back Pain    Lindsey [name] is a 52 y.o. female.  51 yo  F with a chief complaint of chest pain.  This is left-sided and radiates down the left arm.  Occurs worse with deep breathing.  Has been taking Tylenol for some right hand pain and realized that the Tylenol is no longer helping for her chest discomfort she took a nitroglycerin with some improvement and decided to come to the emergency department for evaluation.  Has been having symptoms for about 3 days now.  Not exertional.  Denies trauma.  Denies cough congestion or fever.  She has a history of Antithrombin III, has had an MI with a bare-metal stent placed in 2019.  She denies history of PE or DVT.  She is on Xarelto for her blood clotting disorder.   Chest Pain Associated symptoms: back pain and shortness of breath   Shortness of Breath Associated symptoms: chest pain   Back Pain Associated symptoms: chest pain        Home Medications Prior to Admission medications   Medication Sig Start Date End Date Taking? Authorizing Provider  diclofenac Sodium (VOLTAREN) 1 % GEL Apply 4 g topically 4 (four) times daily. 02/04/22  Yes Melene Plan, DO  acetaminophen (TYLENOL) 325 MG tablet Take 1-2 tablets (325-650 mg total) by mouth every 4 (four) hours as needed for mild pain. 07/14/17   Love, Evlyn Kanner, PA-C  amLODipine (NORVASC) 10 MG tablet Take 1 tablet (10 mg total) by mouth daily. 11/04/18   Black, Lesle Chris, NP  atorvastatin (LIPITOR) 40 MG tablet Take 1 tablet (40 mg total) by mouth daily. 11/04/18   Black, Lesle Chris, NP  carvedilol (COREG) 25 MG tablet Take 1 tablet (25 mg total) by mouth 2 (two) times daily with a meal. 11/04/18   Black, Lesle Chris, NP  clopidogrel (PLAVIX) 75 MG tablet Take 1 tablet (75 mg total) by mouth  daily. 11/04/18   Black, Lesle Chris, NP  hydrochlorothiazide (HYDRODIURIL) 25 MG tablet Take 0.5 tablets (12.5 mg total) by mouth daily. 11/04/18   Black, Lesle Chris, NP  HYDROcodone-acetaminophen (NORCO/VICODIN) 5-325 MG tablet Take 1-2 tablets by mouth every 6 (six) hours as needed. 12/14/21   Roxy Horseman, PA-C  nitroGLYCERIN (NITROSTAT) 0.4 MG SL tablet Place 1 tablet (0.4 mg total) under the tongue every 5 (five) minutes as needed for chest pain (up to 3 doses). 08/16/16   Rodolph Bong, MD  pantoprazole (PROTONIX) 40 MG tablet Take 1 tablet (40 mg total) by mouth 2 (two) times daily. 11/04/18   Black, Lesle Chris, NP  rivaroxaban (XARELTO) 20 MG TABS tablet Take 1 tablet (20 mg total) by mouth daily. 11/04/18   Black, Lesle Chris, NP      Allergies    Patient has no known allergies.    Review of Systems   Review of Systems  Respiratory:  Positive for shortness of breath.   Cardiovascular:  Positive for chest pain.  Musculoskeletal:  Positive for back pain.    Physical Exam Updated Vital Signs BP 107/75   Pulse (!) 53   Temp 98.1 F (36.7 C)   Resp 14   Ht 5\' 6"  (1.676 m)   Wt 81.6 kg   SpO2 100%  BMI 29.05 kg/m  Physical Exam Vitals and nursing note reviewed.  Constitutional:      General: She is not in acute distress.    Appearance: She is well-developed. She is not diaphoretic.  HENT:     Head: Normocephalic and atraumatic.  Eyes:     Pupils: Pupils are equal, round, and reactive to light.  Cardiovascular:     Rate and Rhythm: Normal rate and regular rhythm.     Heart sounds: No murmur heard.    No friction rub. No gallop.  Pulmonary:     Effort: Pulmonary effort is normal.     Breath sounds: No wheezing or rales.  Chest:     Chest wall: Tenderness present.     Comments: Pain with palpation of the left anterior chest about the midclavicular line about ribs 4 through 6 reproduces her discomfort Abdominal:     General: There is no distension.     Palpations: Abdomen is  soft.     Tenderness: There is no abdominal tenderness.  Musculoskeletal:        General: No tenderness.     Cervical back: Normal range of motion and neck supple.  Skin:    General: Skin is warm and dry.  Neurological:     Mental Status: She is alert and oriented to person, place, and time.  Psychiatric:        Behavior: Behavior normal.     ED Results / Procedures / Treatments   Labs (all labs ordered are listed, but only abnormal results are displayed) Labs Reviewed  BASIC METABOLIC PANEL - Abnormal; Notable for the following components:      Result Value   Glucose, Bld 120 (*)    All other components within normal limits  CBC  TROPONIN I (HIGH SENSITIVITY)  TROPONIN I (HIGH SENSITIVITY)    EKG EKG Interpretation  Date/Time:  Tuesday February 04 2022 09:36:15 EST Ventricular Rate:  65 PR Interval:  162 QRS Duration: 84 QT Interval:  387 QTC Calculation: 403 R Axis:   89 Text Interpretation: Sinus rhythm Low voltage, precordial leads Probable anteroseptal infarct, old background noise no longer present Otherwise no significant change Confirmed by Melene Plan [phone]) on 02/04/2022 9:41:23 AM  Radiology DG Chest 2 View  Result Date: 02/04/2022 CLINICAL DATA:  Provided history: Chest pain. Additional history provided: Shortness of breath, back pain. EXAM: CHEST - 2 VIEW COMPARISON:  None Available. FINDINGS: Heart size within normal limits. No appreciable airspace consolidation or pulmonary edema. No evidence of pleural effusion or pneumothorax. No acute bony abnormality identified. IMPRESSION: No evidence of active cardiopulmonary disease. Electronically Signed   By: Jackey Loge D.O.   On: 02/04/2022 09:21    Procedures Procedures    Medications Ordered in ED Medications  acetaminophen (TYLENOL) tablet 1,000 mg (1,000 mg Oral Given 02/04/22 6734)    ED Course/ Medical Decision Making/ A&P                           Medical Decision Making Amount and/or  Complexity of Data Reviewed Labs: ordered. Radiology: ordered. ECG/medicine tests: ordered.  Risk OTC drugs.   51 yo F with a chief complaint of chest pain.  This is left-sided and worse with deep breathing and palpation.  Is been going on for about 3 days now.  She has a history of an MI in the past and thought this felt somewhat similar and so came here for evaluation.  EKG with some background noise but no obvious ischemia.  We will repeat.  Troponin chest x-ray reassess.   Troponin is negative, patient has had symptoms for 3 days without significant change in the last 6 hours.  Do not feel a delta is warranted.  EKG is nonischemic.  Chest x-ray independently interpreted by me without focal infiltrate or pneumothorax.  No anemia no significant electrolyte abnormality.  I discussed results with the patient.  We will treat as musculoskeletal.  With her significant history we will give her urgent referral to cardiology clinic.  I encouraged her to return for any worsening or if her symptoms became exertional.  10:36 AM:  I have discussed the diagnosis/risks/treatment options with the patient.  Evaluation and diagnostic testing in the emergency department does not suggest an emergent condition requiring admission or immediate intervention beyond what has been performed at this time.  They will follow up with Cards. We also discussed returning to the ED immediately if new or worsening sx occur. We discussed the sx which are most concerning (e.g., sudden worsening pain, fever, inability to tolerate by mouth) that necessitate immediate return. Medications administered to the patient during their visit and any new prescriptions provided to the patient are listed below.  Medications given during this visit Medications  acetaminophen (TYLENOL) tablet 1,000 mg (1,000 mg Oral Given 02/04/22 0955)     The patient appears reasonably screen and/or stabilized for discharge and I doubt any other medical  condition or other [facility] requiring further screening, evaluation, or treatment in the ED at this time prior to discharge.         Final Clinical Impression(s) / ED Diagnoses Final diagnoses:  Nonspecific chest pain    Rx / DC Orders ED Discharge Orders          Ordered    diclofenac Sodium (VOLTAREN) 1 % GEL  4 times daily        02/04/22 1030    Ambulatory referral to Cardiology       Comments: If you have not heard from the Cardiology office within the next 72 hours please call [phone].   02/04/22 1031              Melene Plan, DO 02/04/22 1036

## 2022-02-04 NOTE — ED Triage Notes (Signed)
Pt. Stated, Lindsey [name] had some chest pain with SOB that's going to my back for the last 3 days. Ive had a heart attack before so I know all the signs.

## 2022-02-04 NOTE — Discharge Instructions (Addendum)
Use the gel as prescribed. Also take tylenol 1000mg (2 extra strength) four times a day.   As we discussed please return for worsening pain or pain that is worse on exertion especially when you are going up stairs.  I have put an order in for the cardiology office to try and call you on the phone to set up an appointment.

## 2022-02-18 ENCOUNTER — Encounter: Payer: Self-pay | Admitting: Cardiology

## 2022-02-18 ENCOUNTER — Ambulatory Visit: Payer: Medicare Other | Admitting: Cardiovascular Disease

## 2022-02-18 ENCOUNTER — Ambulatory Visit: Payer: Medicare Other | Attending: Cardiology | Admitting: Cardiology

## 2022-02-18 VITALS — BP 128/80 | HR 66 | Ht 66.0 in | Wt 189.6 lb

## 2022-02-18 DIAGNOSIS — Z131 Encounter for screening for diabetes mellitus: Secondary | ICD-10-CM | POA: Diagnosis not present

## 2022-02-18 DIAGNOSIS — E785 Hyperlipidemia, unspecified: Secondary | ICD-10-CM | POA: Diagnosis not present

## 2022-02-18 DIAGNOSIS — Z01812 Encounter for preprocedural laboratory examination: Secondary | ICD-10-CM

## 2022-02-18 DIAGNOSIS — I2510 Atherosclerotic heart disease of native coronary artery without angina pectoris: Secondary | ICD-10-CM

## 2022-02-18 DIAGNOSIS — F17200 Nicotine dependence, unspecified, uncomplicated: Secondary | ICD-10-CM

## 2022-02-18 MED ORDER — CLOPIDOGREL BISULFATE 75 MG PO TABS: 75.0000 mg | ORAL_TABLET | Freq: Every day | ORAL | 3 refills | Status: AC

## 2022-02-18 MED ORDER — ISOSORBIDE MONONITRATE ER 30 MG PO TB24: 30.0000 mg | ORAL_TABLET | Freq: Every day | ORAL | 3 refills | Status: DC

## 2022-02-18 MED ORDER — NITROGLYCERIN 0.4 MG SL SUBL: 0.4000 mg | SUBLINGUAL_TABLET | SUBLINGUAL | 3 refills | Status: AC | PRN

## 2022-02-18 NOTE — H&P (View-Only) (Signed)
Cardiology Office Note:    Date:  02/18/2022   ID:  Lindsey [name], DOB 06/24/1970, MRN [id number]  PCP:  Kelly-Coleman, Monica (Inactive)  Cardiologist:  None  Electrophysiologist:  None   Referring MD: No ref. provider found   " I am having chest pain that is getting worse"  History of Present Illness:    Lindsey [name] is a 51 y.o. female with a hx of CAD, with hx of apical infarct in 2000, STEMI in 2009 with BMS to the proximal LAD with history of apical thrombus, hypertension, hyperlipidemia, TIA, history of MCA infarct in April 2019 AT III deficiency, current smoker here today to be evaluated for chest pain.  The patient normally follows with Dr. Brian Crenshaw and was last seen by Katherine Lawrence, NP back in July 2019.  She requested to be seen today because she had been experiencing intermittent chest pain.  She described it as a midsternal burning sensation.  She notes that it comes on exertion once it comes it feels burning and then radiates to her shoulder.  At first she thought this was going to go.  But is been 3 weeks and she still experiencing these pressure sensation which the frequency is getting more more.  Of recent she has been taking nitroglycerin at least 2 or 3 times a day which helped relieve the pain.  No shortness of breath.  Past Medical History:  Diagnosis Date   Anemia    Antithrombin III deficiency (HCC)    ?pt not sure if true diagnosis   Anxiety    Apical mural thrombus    Blood transfusion    Cholecystitis 07/2016   Coronary artery disease    a. apical LAD infarction '00. b. NSTEMI s/p BMS to prox LAD '09. c. Cath 01/2015: stable LAD stent, otherwise minimal nonobstructive CAD.   GERD (gastroesophageal reflux disease)    Hyperlipidemia    Hypertension    Myocardial infarction (HCC)    Noncompliance with medication regimen    a. h/o noncompliance with med regimen (previous running out of Coumadin).   Pancreatitis    Pancreatitis, acute     Stroke (cerebrum) (HCC) 08/2017   Stroke (HCC)    assoc with short term memory loss and right peripheral vision loss; age 38    TIA (transient ischemic attack) 2010   Tobacco abuse     Past Surgical History:  Procedure Laterality Date   CARDIAC CATHETERIZATION     CARDIAC CATHETERIZATION N/A 01/29/2015   Procedure: Left Heart Cath and Coronary Angiography;  Surgeon: Peter M Jordan, MD;  Location: [facility];  Service: Cardiovascular;  Laterality: N/A;   CHOLECYSTECTOMY N/A 08/14/2016   Procedure: LAPAROSCOPIC CHOLECYSTECTOMY;  Surgeon: Wilson, Eric, MD;  Location: [facility] OR;  Service: General;  Laterality: N/A;   COLONOSCOPY N/A 03/29/2014   Procedure: COLONOSCOPY;  Surgeon: Robert D Kaplan, MD;  Location: [facility];  Service: Endoscopy;  Laterality: N/A;   CORONARY ANGIOPLASTY     ESOPHAGOGASTRODUODENOSCOPY N/A 06/09/2012   Procedure: ESOPHAGOGASTRODUODENOSCOPY (EGD);  Surgeon: Patrick D Hung, MD;  Location: [facility];  Service: Endoscopy;  Laterality: N/A;   ESOPHAGOGASTRODUODENOSCOPY N/A 08/02/2013   Procedure: ESOPHAGOGASTRODUODENOSCOPY (EGD);  Surgeon: Malcolm T Stark, MD;  Location: [facility];  Service: Endoscopy;  Laterality: N/A;   IR ANGIO INTRA EXTRACRAN SEL COM CAROTID INNOMINATE UNI R MOD SED  07/08/2017   LEFT HEART CATHETERIZATION WITH CORONARY ANGIOGRAM N/A 12/24/2011   Procedure: LEFT HEART CATHETERIZATION WITH CORONARY ANGIOGRAM;  Surgeon:   Kathleene Hazel, MD;  Location: [facility] CATH LAB;  Service: Cardiovascular;  Laterality: N/A;   RADIOLOGY WITH ANESTHESIA N/A 07/08/2017   Procedure: IR WITH ANESTHESIA;  Surgeon: Radiologist, Medication, MD;  Location: [facility] OR;  Service: Radiology;  Laterality: N/A;   TUBAL LIGATION      Current Medications: Current Meds  Medication Sig   acetaminophen (TYLENOL) 325 MG tablet Take 1-2 tablets (325-650 mg total) by mouth every 4 (four) hours as needed for mild pain.   amLODipine (NORVASC) 10 MG tablet Take 1 tablet (10 mg  total) by mouth daily.   atorvastatin (LIPITOR) 40 MG tablet Take 1 tablet (40 mg total) by mouth daily.   carvedilol (COREG) 25 MG tablet Take 1 tablet (25 mg total) by mouth 2 (two) times daily with a meal.   diclofenac Sodium (VOLTAREN) 1 % GEL Apply 4 g topically 4 (four) times daily.   hydrochlorothiazide (HYDRODIURIL) 25 MG tablet Take 0.5 tablets (12.5 mg total) by mouth daily.   HYDROcodone-acetaminophen (NORCO/VICODIN) 5-325 MG tablet Take 1-2 tablets by mouth every 6 (six) hours as needed.   isosorbide mononitrate (IMDUR) 30 MG 24 hr tablet Take 1 tablet (30 mg total) by mouth daily.   pantoprazole (PROTONIX) 40 MG tablet Take 1 tablet (40 mg total) by mouth 2 (two) times daily.   rivaroxaban (XARELTO) 20 MG TABS tablet Take 1 tablet (20 mg total) by mouth daily.   [DISCONTINUED] clopidogrel (PLAVIX) 75 MG tablet Take 1 tablet (75 mg total) by mouth daily.   [DISCONTINUED] nitroGLYCERIN (NITROSTAT) 0.4 MG SL tablet Place 1 tablet (0.4 mg total) under the tongue every 5 (five) minutes as needed for chest pain (up to 3 doses).     Allergies:   Patient has no known allergies.   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  Tobacco Use   Smoking status: Former    Packs/day: 0.20    Years: 1.00    Total pack years: 0.20    Types: Cigarettes    Quit date: 10/27/2018    Years since quitting: 3.3   Smokeless tobacco: Never   Tobacco comments:    2018  " i STILL SMOKE 3  CIGARETTES A DAY "  Vaping Use   Vaping Use: Former  Substance and Sexual Activity   Alcohol use: Yes    Alcohol/week: 0.0 standard drinks of alcohol    Comment: Social drinker   Drug use: Not Currently    Types: Cocaine    Comment: hx of use x 1 in the past   Sexual activity: Not  Currently    Birth control/protection: Surgical  Other Topics Concern   Not on file  Social History Narrative   Not on file   Social Determinants of Health   Financial Resource Strain: Not on file  Food Insecurity: Not on file  Transportation Needs: Not on file  Physical Activity: Not on file  Stress: Not on file  Social Connections: Not on file     Family History: The patient's family history includes Breast cancer in her maternal aunt; Heart disease in her brother; Stroke in her father.  ROS:   Review of Systems  Constitution: Negative for decreased appetite, fever and weight gain.  HENT: Negative  for congestion, ear discharge, hoarse voice and sore throat.   Eyes: Negative for discharge, redness, vision loss in right eye and visual halos.  Cardiovascular: Reports chest pain.negative for  dyspnea on exertion, leg swelling, orthopnea and palpitations.  Respiratory: Negative for cough, hemoptysis, shortness of breath and snoring.   Endocrine: Negative for heat intolerance and polyphagia.  Hematologic/Lymphatic: Negative for bleeding problem. Does not bruise/bleed easily.  Skin: Negative for flushing, nail changes, rash and suspicious lesions.  Musculoskeletal: Negative for arthritis, joint pain, muscle cramps, myalgias, neck pain and stiffness.  Gastrointestinal: Negative for abdominal pain, bowel incontinence, diarrhea and excessive appetite.  Genitourinary: Negative for decreased libido, genital sores and incomplete emptying.  Neurological: Negative for brief paralysis, focal weakness, headaches and loss of balance.  Psychiatric/Behavioral: Negative for altered mental status, depression and suicidal ideas.  Allergic/Immunologic: Negative for HIV exposure and persistent infections.    EKGs/Labs/Other Studies Reviewed:    The following studies were reviewed today:   EKG:  The ekg ordered today demonstrates sinus rhythm, heart rate 66 bpm with evidence of old anteroseptal  infarction.  Nuclear stress test July 2019 The left ventricular ejection fraction is mildly decreased (45-54%). Nuclear stress EF: 54%. No T wave inversion was noted during stress. There was no ST segment deviation noted during stress. Defect 1: There is a medium defect of severe severity. Findings consistent with prior myocardial infarction. This is a low risk study.  Medium size, severe intensity fixed apical perfusion defect, suggestive of scar. No reversible ischemia. LVEF 54% with apical dyskinesis. This is a low risk study.  TTE 07/09/2017 Study Conclusions  - Left ventricle: The cavity size was normal. Wall thickness was    increased in a pattern of mild LVH. Systolic function was normal.    The estimated ejection fraction was in the range of 55% to 60%.    Akinesis of the apical myocardium. Doppler parameters are    consistent with abnormal left ventricular relaxation (grade 1    diastolic dysfunction). There was a medium-sized, fixed,  apicalthrombus.  - Left atrium: The atrium was mildly dilated.    Cardiac MR 01/2015 FINDINGS: The atria were normal in size. RV normal in size and function. Mild MR. Normal aortic valve. Trivial pericardial effusion No ASD/VSD. The distal anterior wall, septum and entire apex were akinetic   The quantitative EF was 50% with normal mid and basal function. Mild LVH septal thickness 12 mm.   Delayed enhancement images with gadolinium showed full thickness scar involving the distal anterior wall, septum and apex. Long TI imaging post contrast showed a small amount   Of mural apical thrombus   IMPRESSION: 1) Mild LVE/LVH EF 50% with distal anterior wall, septal and apical akinesis   2) Post Gadolinium images with full thickness scar involving above areas   3) Long TI inversion recovery sequences post contrast show a small amount of mural apical thrombus   4) Mild MR   5) Trivial pericardial effusion   Peter Nishan      Electronically Signed   By: Peter  Nishan M.D.   On: 01/31/2015 16:49    LHC 2016 Prox RCA lesion, 20% stenosed. Mid LAD to Dist LAD lesion, 20% stenosed. There is mild left ventricular systolic dysfunction.   1. Minimal nonobstructive CAD. Patent stent in the LAD 2. Mild LV dysfunction with chronic akinesis of the apex. 3. Complex filling defect in the LV apex consistent with thrombus.   Plan: resume full anticoagulation. Will assess with Echo.     Recent Labs: 12/13/2021: ALT 25 02/04/2022: BUN 9; Creatinine, Ser 0.67; Hemoglobin 12.5; Platelets 240; Potassium 4.2; Sodium 142  Recent Lipid Panel    Component Value Date/Time   CHOL 148 11/03/2018 0834   TRIG 64 11/03/2018 0834   HDL 68 11/03/2018 0834   CHOLHDL 2.2 11/03/2018 0834   VLDL 13 11/03/2018 0834   LDLCALC 67 11/03/2018 0834    Physical Exam:    VS:  BP 128/80   Pulse 66   Ht 5' 6" (1.676 m)   Wt 189 lb 9.6 oz (86 kg)   SpO2 97%   BMI 30.60 kg/m     Wt Readings from Last 3 Encounters:  02/18/22 189 lb 9.6 oz (86 kg)  02/04/22 180 lb (81.6 kg)  12/13/21 186 lb (84.4 kg)     GEN: Well nourished, well developed in no acute distress HEENT: Normal NECK: No JVD; No carotid bruits LYMPHATICS: No lymphadenopathy CARDIAC: S1S2 noted,RRR, no murmurs, rubs, gallops RESPIRATORY:  Clear to auscultation without rales, wheezing or rhonchi  ABDOMEN: Soft, non-tender, non-distended, +bowel sounds, no guarding. EXTREMITIES: No edema, No cyanosis, no clubbing MUSCULOSKELETAL:  No deformity  SKIN: Warm and dry NEUROLOGIC:  Alert and oriented x 3, non-focal PSYCHIATRIC:  Normal affect, good insight  ASSESSMENT:    1. Atherosclerosis of native coronary artery of native heart without angina pectoris   2. Hyperlipidemia, unspecified hyperlipidemia type   3. Encounter for preprocedural laboratory examination   4. Screening for diabetes mellitus    PLAN:    Her chest pain is concerning for angina.  I am going to  start the patient on Imdur 30 mg daily in addition she is at intermediate to high risk patient at this time with a history of previous obstructive CAD status post stenting and residual nonobstructive CAD I would like to proceed with any ischemic evaluation in this patient.  She had a nuclear stress test in 2019 which was normal therefore I would like to proceed with a left heart catheterization.  The patient understands that risks include but are not limited to stroke (1 in 1000), death (1 in 1000), kidney failure [usually temporary] (1 in 500), bleeding (1 in 200), allergic reaction [possibly serious] (1 in 200), and agrees to proceed.  Smoking cessation advised today in the office.  It appears she has not had follow-up lipid profiles for a while.  Would like to get a lipid profile done today as well as screen the patient for diabetes with hemoglobin A1c.  The patient is in agreement with the above plan. The patient left the office in stable condition.  The patient will follow up in 2 to 4 weeks post cath with Dr. Crenshaw.   Medication Adjustments/Labs and Tests Ordered: Current medicines are reviewed at length with the patient today.  Concerns regarding medicines are outlined above.  Orders Placed This Encounter  Procedures   Lipid panel   CBC   Basic Metabolic Panel (BMET)   Hemoglobin A1c   EKG 12-Lead   Meds ordered this encounter  Medications   clopidogrel (PLAVIX) 75 MG tablet    Sig: Take 1 tablet (75 mg total) by mouth daily.    Dispense:  90 tablet    Refill:  3   isosorbide mononitrate (IMDUR) 30 MG 24 hr tablet    Sig: Take 1 tablet (30 mg total) by mouth daily.    Dispense:  90 tablet    Refill:  3   nitroGLYCERIN (NITROSTAT) 0.4 MG SL tablet      Sig: Place 1 tablet (0.4 mg total) under the tongue every 5 (five) minutes as needed for chest pain (up to 3 doses).    Dispense:  25 tablet    Refill:  3    Patient Instructions  Medication Instructions:  Your physician  has recommended you make the following change in your medication:  START: Imdur 30mg daily.  RESTART: Nitroglycerin 0.4 mg place 1 tablet under your tongue every 5 minutes (up to three times) as needed for chest pain.  *If you need a refill on your cardiac medications before your next appointment, please call your pharmacy*   Lab Work: Your physician recommends that you have the following labs drawn today: Hemoglobin A1c, Lipids, CBC and BMP.  If you have labs (blood work) drawn today and your tests are completely normal, you will receive your results only by: MyChart Message (if you have MyChart) OR A paper copy in the mail If you have any lab test that is abnormal or we need to change your treatment, we will call you to review the results.   Testing/Procedures: Your physician has requested that you have a cardiac catheterization. Cardiac catheterization is used to diagnose and/or treat various heart conditions. Doctors may recommend this procedure for a number of different reasons. The most common reason is to evaluate chest pain. Chest pain can be a symptom of coronary artery disease (CAD), and cardiac catheterization can show whether plaque is narrowing or blocking your heart's arteries. This procedure is also used to evaluate the valves, as well as measure the blood flow and oxygen levels in different parts of your heart. For further information please visit www.[email or web address].org. Please follow instruction sheet, as given.    Follow-Up: At [facility], you and your health needs are our priority.  As part of our continuing mission to provide you with exceptional heart care, we have created designated Provider Care Teams.  These Care Teams include your primary Cardiologist (physician) and Advanced Practice Providers (APPs -  Physician Assistants and Nurse Practitioners) who all work together to provide you with the care you need, when you need it.  We recommend signing up for the  patient portal called "MyChart".  Sign up information is provided on this After Visit Summary.  MyChart is used to connect with patients for Virtual Visits (Telemedicine).  Patients are able to view lab/test results, encounter notes, upcoming appointments, etc.  Non-urgent messages can be sent to your provider as well.   To learn more about what you can do with MyChart, go to [email or web address].    Your next appointment:   4 month(s)  The format for your next appointment:   In Person  Provider:   Brian Crenshaw, MD   Other Instructions       Cardiac/Peripheral Catheterization   You are scheduled for a Cardiac Catheterization on Thursday, December 7 with Dr. Thomas Kelly.  1. Please arrive at the Main Entrance A at [facility]: [address] [place], [place] [address] on December 7 at 7:00 AM (This time is two hours before your procedure to ensure your preparation). Free valet parking service is available. You will check in at ADMITTING. The support person will be asked to wait in the waiting room.  It is OK to have someone drop you off and come back when you are ready to be discharged.        Special note: Every effort is made to have your procedure done on time. Please understand that   emergencies sometimes delay scheduled procedures.   . 2. Diet: Do not eat solid foods after midnight.  You may have clear liquids until 5 AM the day of the procedure.  3. Labs: You will need to have blood drawn TODAY IN OFFICE  4. Medication instructions in preparation for your procedure:   Contrast Allergy: No   Stop taking Eliquis (Apixiban) on Tuesday, December 5.  Stop taking, HTCZ (Hydrochlorothiazide) Wednesday, November 6,    On the morning of your procedure, take Aspirin 81 mg and any morning medicines NOT listed above.  You may use sips of water.  5. Plan to go home the same day, you will only stay overnight if medically necessary. 6. You MUST have a responsible adult  to drive you home. 7. An adult MUST be with you the first 24 hours after you arrive home. 8. Bring a current list of your medications, and the last time and date medication taken. 9. Bring ID and current insurance cards. 10.Please wear clothes that are easy to get on and off and wear slip-on shoes.  Thank you for allowing us to care for you!   -- [facility]   Important Information About Sugar         Adopting a Healthy Lifestyle.  Know what a healthy weight is for you (roughly BMI <25) and aim to maintain this   Aim for 7+ servings of fruits and vegetables daily   65-80+ fluid ounces of water or unsweet tea for healthy kidneys   Limit to max 1 drink of alcohol per day; avoid smoking/tobacco   Limit animal fats in diet for cholesterol and heart health - choose grass fed whenever available   Avoid highly processed foods, and foods high in saturated/trans fats   Aim for low stress - take time to unwind and care for your mental health   Aim for 150 min of moderate intensity exercise weekly for heart health, and weights twice weekly for bone health   Aim for 7-9 hours of sleep daily   When it comes to diets, agreement about the perfect plan isnt easy to find, even among the experts. Experts at the [employer] developed an idea known as the Healthy Eating Plate. Just imagine a plate divided into logical, healthy portions.   The emphasis is on diet quality:   Load up on vegetables and fruits - one-half of your plate: Aim for color and variety, and remember that potatoes dont count.   Go for whole grains - one-quarter of your plate: Whole wheat, barley, wheat berries, quinoa, oats, brown rice, and foods made with them. If you want pasta, go with whole wheat pasta.   Protein power - one-quarter of your plate: Fish, chicken, beans, and nuts are all healthy, versatile protein sources. Limit red meat.   The diet, however, does  go beyond the plate, offering a few other suggestions.   Use healthy plant oils, such as olive, canola, soy, corn, sunflower and peanut. Check the labels, and avoid partially hydrogenated oil, which have unhealthy trans fats.   If youre thirsty, drink water. Coffee and tea are good in moderation, but skip sugary drinks and limit milk and dairy products to one or two daily servings.   The type of carbohydrate in the diet is more important than the amount. Some sources of carbohydrates, such as vegetables, fruits, whole grains, and beans-are healthier than others.   Finally, stay active  Signed, Jeffie Widdowson,   DO  02/18/2022 12:22 PM    [facility] 

## 2022-02-18 NOTE — Patient Instructions (Signed)
Medication Instructions:  Your physician has recommended you make the following change in your medication:  START: Imdur 30mg  daily.  RESTART: Nitroglycerin 0.4 mg place 1 tablet under your tongue every 5 minutes (up to three times) as needed for chest pain.  *If you need a refill on your cardiac medications before your next appointment, please call your pharmacy*   Lab Work: Your physician recommends that you have the following labs drawn today: Hemoglobin A1c, Lipids, CBC and BMP.  If you have labs (blood work) drawn today and your tests are completely normal, you will receive your results only by: MyChart Message (if you have MyChart) OR A paper copy in the mail If you have any lab test that is abnormal or we need to change your treatment, we will call you to review the results.   Testing/Procedures: Your physician has requested that you have a cardiac catheterization. Cardiac catheterization is used to diagnose and/or treat various heart conditions. Doctors may recommend this procedure for a number of different reasons. The most common reason is to evaluate chest pain. Chest pain can be a symptom of coronary artery disease (CAD), and cardiac catheterization can show whether plaque is narrowing or blocking your heart's arteries. This procedure is also used to evaluate the valves, as well as measure the blood flow and oxygen levels in different parts of your heart. For further information please visit . Please follow instruction sheet, as given.    Follow-Up: At [facility], you and your health needs are our priority.  As part of our continuing mission to provide you with exceptional heart care, we have created designated Provider Care Teams.  These Care Teams include your primary Cardiologist (physician) and Advanced Practice Providers (APPs -  Physician Assistants and Nurse Practitioners) who all work together to provide you with the care you need, when you need  it.  We recommend signing up for the patient portal called "MyChart".  Sign up information is provided on this After Visit Summary.  MyChart is used to connect with patients for Virtual Visits (Telemedicine).  Patients are able to view lab/test results, encounter notes, upcoming appointments, etc.  Non-urgent messages can be sent to your provider as well.   To learn more about what you can do with MyChart, go to [facility].    Your next appointment:   4 month(s)  The format for your next appointment:   In Person  Provider:   [email or web address], [email or web address]   Other Instructions       Cardiac/Peripheral Catheterization   You are scheduled for a Cardiac Catheterization on Thursday, December 7 with Dr. [email or web address].  1. Please arrive at the Main Entrance A at [facility]: [address] [place], [place] on December 7 at 7:00 AM (This time is two hours before your procedure to ensure your preparation). Free valet parking service is available. You will check in at ADMITTING. The support person will be asked to wait in the waiting room.  It is OK to have someone drop you off and come back when you are ready to be discharged.        Special note: Every effort is made to have your procedure done on time. Please understand that emergencies sometimes delay scheduled procedures.   . 2. Diet: Do not eat solid foods after midnight.  You may have clear liquids until 5 AM the day of the procedure.  3. Labs: You will need to have blood drawn TODAY IN  OFFICE  4. Medication instructions in preparation for your procedure:   Contrast Allergy: No   Stop taking Eliquis (Apixiban) on Tuesday, December 5.  Stop taking, HTCZ (Hydrochlorothiazide) Wednesday, November 6,    On the morning of your procedure, take Aspirin 81 mg and any morning medicines NOT listed above.  You may use sips of water.  5. Plan to go home the same day, you will only stay overnight if medically  necessary. 6. You MUST have a responsible adult to drive you home. 7. An adult MUST be with you the first 24 hours after you arrive home. 8. Bring a current list of your medications, and the last time and date medication taken. 9. Bring ID and current insurance cards. 10.Please wear clothes that are easy to get on and off and wear slip-on shoes.  Thank you for allowing [place] to care for you!   -- [place] [facility]   Important Information About Sugar

## 2022-02-18 NOTE — Progress Notes (Signed)
Cardiology Office Note:    Date:  02/18/2022   ID:  Lindsey [name], DOB 10-13-1970, MRN [id number]  PCP:  Eston Esters (Inactive)  Cardiologist:  None  Electrophysiologist:  None   Referring MD: No ref. provider found   " I am having chest pain that is getting worse"  History of Present Illness:    Lindsey [name] is a 51 y.o. female with a hx of CAD, with hx of apical infarct in 2000, STEMI in 2009 with BMS to the proximal LAD with history of apical thrombus, hypertension, hyperlipidemia, TIA, history of MCA infarct in April 2019 AT III deficiency, current smoker here today to be evaluated for chest pain.  The patient normally follows with Dr. Kirk Ruths and was last seen by Bunnie Domino, NP back in July 2019.  She requested to be seen today because she had been experiencing intermittent chest pain.  She described it as a midsternal burning sensation.  She notes that it comes on exertion once it comes it feels burning and then radiates to her shoulder.  At first she thought this was going to go.  But is been 3 weeks and she still experiencing these pressure sensation which the frequency is getting more more.  Of recent she has been taking nitroglycerin at least 2 or 3 times a day which helped relieve the pain.  No shortness of breath.  Past Medical History:  Diagnosis Date   Anemia    Antithrombin III deficiency ([place])    ?pt not sure if true diagnosis   Anxiety    Apical mural thrombus    Blood transfusion    Cholecystitis 07/2016   Coronary artery disease    a. apical LAD infarction '00. b. NSTEMI s/p BMS to prox LAD '09. c. Cath 01/2015: stable LAD stent, otherwise minimal nonobstructive CAD.   GERD (gastroesophageal reflux disease)    Hyperlipidemia    Hypertension    Myocardial infarction ([place])    Noncompliance with medication regimen    a. h/o noncompliance with med regimen (previous running out of Coumadin).   Pancreatitis    Pancreatitis, acute     Stroke (cerebrum) ([place]) 08/2017   Stroke ([place])    assoc with short term memory loss and right peripheral vision loss; age 17    TIA (transient ischemic attack) 2010   Tobacco abuse     Past Surgical History:  Procedure Laterality Date   CARDIAC CATHETERIZATION     CARDIAC CATHETERIZATION N/A 01/29/2015   Procedure: Left Heart Cath and Coronary Angiography;  Surgeon: Peter M Martinique, MD;  Location: [place] [facility];  Service: Cardiovascular;  Laterality: N/A;   CHOLECYSTECTOMY N/A 08/14/2016   Procedure: LAPAROSCOPIC CHOLECYSTECTOMY;  Surgeon: Greer Pickerel, MD;  Location: [place];  Service: General;  Laterality: N/A;   COLONOSCOPY N/A 03/29/2014   Procedure: COLONOSCOPY;  Surgeon: Inda Castle, MD;  Location: [place];  Service: Endoscopy;  Laterality: N/A;   CORONARY ANGIOPLASTY     ESOPHAGOGASTRODUODENOSCOPY N/A 06/09/2012   Procedure: ESOPHAGOGASTRODUODENOSCOPY (EGD);  Surgeon: Beryle Beams, MD;  Location: [facility];  Service: Endoscopy;  Laterality: N/A;   ESOPHAGOGASTRODUODENOSCOPY N/A 08/02/2013   Procedure: ESOPHAGOGASTRODUODENOSCOPY (EGD);  Surgeon: Ladene Artist, MD;  Location:  [facility];  Service: Endoscopy;  Laterality: N/A;   IR ANGIO INTRA EXTRACRAN SEL COM CAROTID INNOMINATE UNI R MOD SED  07/08/2017   LEFT HEART CATHETERIZATION WITH CORONARY ANGIOGRAM N/A 12/24/2011   Procedure: LEFT HEART CATHETERIZATION WITH CORONARY ANGIOGRAM;  Surgeon:  Kathleene Hazel, MD;  Location: [facility] CATH LAB;  Service: Cardiovascular;  Laterality: N/A;   RADIOLOGY WITH ANESTHESIA N/A 07/08/2017   Procedure: IR WITH ANESTHESIA;  Surgeon: Radiologist, Medication, MD;  Location: [facility] OR;  Service: Radiology;  Laterality: N/A;   TUBAL LIGATION      Current Medications: Current Meds  Medication Sig   acetaminophen (TYLENOL) 325 MG tablet Take 1-2 tablets (325-650 mg total) by mouth every 4 (four) hours as needed for mild pain.   amLODipine (NORVASC) 10 MG tablet Take 1 tablet (10 mg  total) by mouth daily.   atorvastatin (LIPITOR) 40 MG tablet Take 1 tablet (40 mg total) by mouth daily.   carvedilol (COREG) 25 MG tablet Take 1 tablet (25 mg total) by mouth 2 (two) times daily with a meal.   diclofenac Sodium (VOLTAREN) 1 % GEL Apply 4 g topically 4 (four) times daily.   hydrochlorothiazide (HYDRODIURIL) 25 MG tablet Take 0.5 tablets (12.5 mg total) by mouth daily.   HYDROcodone-acetaminophen (NORCO/VICODIN) 5-325 MG tablet Take 1-2 tablets by mouth every 6 (six) hours as needed.   isosorbide mononitrate (IMDUR) 30 MG 24 hr tablet Take 1 tablet (30 mg total) by mouth daily.   pantoprazole (PROTONIX) 40 MG tablet Take 1 tablet (40 mg total) by mouth 2 (two) times daily.   rivaroxaban (XARELTO) 20 MG TABS tablet Take 1 tablet (20 mg total) by mouth daily.   [DISCONTINUED] clopidogrel (PLAVIX) 75 MG tablet Take 1 tablet (75 mg total) by mouth daily.   [DISCONTINUED] nitroGLYCERIN (NITROSTAT) 0.4 MG SL tablet Place 1 tablet (0.4 mg total) under the tongue every 5 (five) minutes as needed for chest pain (up to 3 doses).     Allergies:   Patient has no known allergies.   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  Tobacco Use   Smoking status: Former    Packs/day: 0.20    Years: 1.00    Total pack years: 0.20    Types: Cigarettes    Quit date: 10/27/2018    Years since quitting: 3.3   Smokeless tobacco: Never   Tobacco comments:    2018  " i STILL SMOKE 3  CIGARETTES A DAY "  Vaping Use   Vaping Use: Former  Substance and Sexual Activity   Alcohol use: Yes    Alcohol/week: 0.0 standard drinks of alcohol    Comment: Social drinker   Drug use: Not Currently    Types: Cocaine    Comment: hx of use x 1 in the past   Sexual activity: Not  Currently    Birth control/protection: Surgical  Other Topics Concern   Not on file  Social History Narrative   Not on file   Social Determinants of Health   Financial Resource Strain: Not on file  Food Insecurity: Not on file  Transportation Needs: Not on file  Physical Activity: Not on file  Stress: Not on file  Social Connections: Not on file     Family History: The patient's family history includes Breast cancer in her maternal aunt; Heart disease in her brother; Stroke in her father.  ROS:   Review of Systems  Constitution: Negative for decreased appetite, fever and weight gain.  HENT: Negative  for congestion, ear discharge, hoarse voice and sore throat.   Eyes: Negative for discharge, redness, vision loss in right eye and visual halos.  Cardiovascular: Reports chest pain.negative for  dyspnea on exertion, leg swelling, orthopnea and palpitations.  Respiratory: Negative for cough, hemoptysis, shortness of breath and snoring.   Endocrine: Negative for heat intolerance and polyphagia.  Hematologic/Lymphatic: Negative for bleeding problem. Does not bruise/bleed easily.  Skin: Negative for flushing, nail changes, rash and suspicious lesions.  Musculoskeletal: Negative for arthritis, joint pain, muscle cramps, myalgias, neck pain and stiffness.  Gastrointestinal: Negative for abdominal pain, bowel incontinence, diarrhea and excessive appetite.  Genitourinary: Negative for decreased libido, genital sores and incomplete emptying.  Neurological: Negative for brief paralysis, focal weakness, headaches and loss of balance.  Psychiatric/Behavioral: Negative for altered mental status, depression and suicidal ideas.  Allergic/Immunologic: Negative for HIV exposure and persistent infections.    EKGs/Labs/Other Studies Reviewed:    The following studies were reviewed today:   EKG:  The ekg ordered today demonstrates sinus rhythm, heart rate 66 bpm with evidence of old anteroseptal  infarction.  Nuclear stress test July 2019 The left ventricular ejection fraction is mildly decreased (45-54%). Nuclear stress EF: 54%. No T wave inversion was noted during stress. There was no ST segment deviation noted during stress. Defect 1: There is a medium defect of severe severity. Findings consistent with prior myocardial infarction. This is a low risk study.  Medium size, severe intensity fixed apical perfusion defect, suggestive of scar. No reversible ischemia. LVEF 54% with apical dyskinesis. This is a low risk study.  TTE 07/09/2017 Study Conclusions  - Left ventricle: The cavity size was normal. Wall thickness was    increased in a pattern of mild LVH. Systolic function was normal.    The estimated ejection fraction was in the range of 55% to 60%.    Akinesis of the apical myocardium. Doppler parameters are    consistent with abnormal left ventricular relaxation (grade 1    diastolic dysfunction). There was a medium-sized, fixed,  apicalthrombus.  - Left atrium: The atrium was mildly dilated.    Cardiac MR 01/2015 FINDINGS: The atria were normal in size. RV normal in size and function. Mild MR. Normal aortic valve. Trivial pericardial effusion No ASD/VSD. The distal anterior wall, septum and entire apex were akinetic   The quantitative EF was 50% with normal mid and basal function. Mild LVH septal thickness 12 mm.   Delayed enhancement images with gadolinium showed full thickness scar involving the distal anterior wall, septum and apex. Long TI imaging post contrast showed a small amount   Of mural apical thrombus   IMPRESSION: 1) Mild LVE/LVH EF 50% with distal anterior wall, septal and apical akinesis   2) Post Gadolinium images with full thickness scar involving above areas   3) Long TI inversion recovery sequences post contrast show a small amount of mural apical thrombus   4) Mild MR   5) Trivial pericardial effusion   Jenkins Rouge      Electronically Signed   By: Jenkins Rouge M.D.   On: 01/31/2015 16:49    LHC 2016 Prox RCA lesion, 20% stenosed. Mid LAD to Dist LAD lesion, 20% stenosed. There is mild left ventricular systolic dysfunction.   1. Minimal nonobstructive CAD. Patent stent in the LAD 2. Mild LV dysfunction with chronic akinesis of the apex. 3. Complex filling defect in the LV apex consistent with thrombus.   Plan: resume full anticoagulation. Will assess with Echo.  Recent Labs: 12/13/2021: ALT 25 02/04/2022: BUN 9; Creatinine, Ser 0.67; Hemoglobin 12.5; Platelets 240; Potassium 4.2; Sodium 142  Recent Lipid Panel    Component Value Date/Time   CHOL 148 11/03/2018 0834   TRIG 64 11/03/2018 0834   HDL 68 11/03/2018 0834   CHOLHDL 2.2 11/03/2018 0834   VLDL 13 11/03/2018 0834   LDLCALC 67 11/03/2018 0834    Physical Exam:    VS:  BP 128/80   Pulse 66   Ht 5\' 6"  (1.676 m)   Wt 189 lb 9.6 oz (86 kg)   SpO2 97%   BMI 30.60 kg/m     Wt Readings from Last 3 Encounters:  02/18/22 189 lb 9.6 oz (86 kg)  02/04/22 180 lb (81.6 kg)  12/13/21 186 lb (84.4 kg)     GEN: Well nourished, well developed in no acute distress HEENT: Normal NECK: No JVD; No carotid bruits LYMPHATICS: No lymphadenopathy CARDIAC: S1S2 noted,RRR, no murmurs, rubs, gallops RESPIRATORY:  Clear to auscultation without rales, wheezing or rhonchi  ABDOMEN: Soft, non-tender, non-distended, +bowel sounds, no guarding. EXTREMITIES: No edema, No cyanosis, no clubbing MUSCULOSKELETAL:  No deformity  SKIN: Warm and dry NEUROLOGIC:  Alert and oriented x 3, non-focal PSYCHIATRIC:  Normal affect, good insight  ASSESSMENT:    1. Atherosclerosis of native coronary artery of native heart without angina pectoris   2. Hyperlipidemia, unspecified hyperlipidemia type   3. Encounter for preprocedural laboratory examination   4. Screening for diabetes mellitus    PLAN:    Her chest pain is concerning for angina.  I am going to  start the patient on Imdur 30 mg daily in addition she is at intermediate to high risk patient at this time with a history of previous obstructive CAD status post stenting and residual nonobstructive CAD I would like to proceed with any ischemic evaluation in this patient.  She had a nuclear stress test in 2019 which was normal therefore I would like to proceed with a left heart catheterization.  The patient understands that risks include but are not limited to stroke (1 in 1000), death (1 in 66), kidney failure [usually temporary] (1 in 500), bleeding (1 in 200), allergic reaction [possibly serious] (1 in 200), and agrees to proceed.  Smoking cessation advised today in the office.  It appears she has not had follow-up lipid profiles for a while.  Would like to get a lipid profile done today as well as screen the patient for diabetes with hemoglobin A1c.  The patient is in agreement with the above plan. The patient left the office in stable condition.  The patient will follow up in 2 to 4 weeks post cath with Dr. Stanford Breed.   Medication Adjustments/Labs and Tests Ordered: Current medicines are reviewed at length with the patient today.  Concerns regarding medicines are outlined above.  Orders Placed This Encounter  Procedures   Lipid panel   CBC   Basic Metabolic Panel (BMET)   Hemoglobin A1c   EKG 12-Lead   Meds ordered this encounter  Medications   clopidogrel (PLAVIX) 75 MG tablet    Sig: Take 1 tablet (75 mg total) by mouth daily.    Dispense:  90 tablet    Refill:  3   isosorbide mononitrate (IMDUR) 30 MG 24 hr tablet    Sig: Take 1 tablet (30 mg total) by mouth daily.    Dispense:  90 tablet    Refill:  3   nitroGLYCERIN (NITROSTAT) 0.4 MG SL tablet  Sig: Place 1 tablet (0.4 mg total) under the tongue every 5 (five) minutes as needed for chest pain (up to 3 doses).    Dispense:  25 tablet    Refill:  3    Patient Instructions  Medication Instructions:  Your physician  has recommended you make the following change in your medication:  START: Imdur 30mg  daily.  RESTART: Nitroglycerin 0.4 mg place 1 tablet under your tongue every 5 minutes (up to three times) as needed for chest pain.  *If you need a refill on your cardiac medications before your next appointment, please call your pharmacy*   Lab Work: Your physician recommends that you have the following labs drawn today: Hemoglobin A1c, Lipids, CBC and BMP.  If you have labs (blood work) drawn today and your tests are completely normal, you will receive your results only by: [place] (if you have MyChart) OR A paper copy in the mail If you have any lab test that is abnormal or we need to change your treatment, we will call you to review the results.   Testing/Procedures: Your physician has requested that you have a cardiac catheterization. Cardiac catheterization is used to diagnose and/or treat various heart conditions. Doctors may recommend this procedure for a number of different reasons. The most common reason is to evaluate chest pain. Chest pain can be a symptom of coronary artery disease (CAD), and cardiac catheterization can show whether plaque is narrowing or blocking your heart's arteries. This procedure is also used to evaluate the valves, as well as measure the blood flow and oxygen levels in different parts of your heart. For further information please visit [email or web address]. Please follow instruction sheet, as given.    Follow-Up: At [facility], you and your health needs are our priority.  As part of our continuing mission to provide you with exceptional heart care, we have created designated Provider Care Teams.  These Care Teams include your primary Cardiologist (physician) and Advanced Practice Providers (APPs -  Physician Assistants and Nurse Practitioners) who all work together to provide you with the care you need, when you need it.  We recommend signing up for the  patient portal called "MyChart".  Sign up information is provided on this After Visit Summary.  MyChart is used to connect with patients for Virtual Visits (Telemedicine).  Patients are able to view lab/test results, encounter notes, upcoming appointments, etc.  Non-urgent messages can be sent to your provider as well.   To learn more about what you can do with MyChart, go to [email or web address].    Your next appointment:   4 month(s)  The format for your next appointment:   In Person  Provider:   Kirk Ruths, MD   Other Instructions       Cardiac/Peripheral Catheterization   You are scheduled for a Cardiac Catheterization on Thursday, December 7 with Dr. Shelva Majestic.  1. Please arrive at the Main Entrance A at [facility]: [place], [place] [address] on December 7 at 7:00 AM (This time is two hours before your procedure to ensure your preparation). Free valet parking service is available. You will check in at ADMITTING. The support person will be asked to wait in the waiting room.  It is OK to have someone drop you off and come back when you are ready to be discharged.        Special note: Every effort is made to have your procedure done on time. Please understand that  emergencies sometimes delay scheduled procedures.   . 2. Diet: Do not eat solid foods after midnight.  You may have clear liquids until 5 AM the day of the procedure.  3. Labs: You will need to have blood drawn TODAY IN OFFICE  4. Medication instructions in preparation for your procedure:   Contrast Allergy: No   Stop taking Eliquis (Apixiban) on Tuesday, December 5.  Stop taking, HTCZ (Hydrochlorothiazide) Wednesday, November 6,    On the morning of your procedure, take Aspirin 81 mg and any morning medicines NOT listed above.  You may use sips of water.  5. Plan to go home the same day, you will only stay overnight if medically necessary. 6. You MUST have a responsible adult  to drive you home. 7. An adult MUST be with you the first 24 hours after you arrive home. 8. Bring a current list of your medications, and the last time and date medication taken. 9. Bring ID and current insurance cards. 10.Please wear clothes that are easy to get on and off and wear slip-on shoes.  Thank you for allowing [place] to care for you!   -- [facility]   Important Information About Sugar         Adopting a Healthy Lifestyle.  Know what a healthy weight is for you (roughly BMI <25) and aim to maintain this   Aim for 7+ servings of fruits and vegetables daily   65-80+ fluid ounces of water or unsweet tea for healthy kidneys   Limit to max 1 drink of alcohol per day; avoid smoking/tobacco   Limit animal fats in diet for cholesterol and heart health - choose grass fed whenever available   Avoid highly processed foods, and foods high in saturated/trans fats   Aim for low stress - take time to unwind and care for your mental health   Aim for 150 min of moderate intensity exercise weekly for heart health, and weights twice weekly for bone health   Aim for 7-9 hours of sleep daily   When it comes to diets, agreement about the perfect plan isnt easy to find, even among the experts. Experts at the [employer] developed an idea known as the Healthy Eating Plate. Just imagine a plate divided into logical, healthy portions.   The emphasis is on diet quality:   Load up on vegetables and fruits - one-half of your plate: Aim for color and variety, and remember that potatoes dont count.   Go for whole grains - one-quarter of your plate: Whole wheat, barley, wheat berries, quinoa, oats, brown rice, and foods made with them. If you want pasta, go with whole wheat pasta.   Protein power - one-quarter of your plate: Fish, chicken, beans, and nuts are all healthy, versatile protein sources. Limit red meat.   The diet, however, does  go beyond the plate, offering a few other suggestions.   Use healthy plant oils, such as olive, canola, soy, corn, sunflower and peanut. Check the labels, and avoid partially hydrogenated oil, which have unhealthy trans fats.   If youre thirsty, drink water. Coffee and tea are good in moderation, but skip sugary drinks and limit milk and dairy products to one or two daily servings.   The type of carbohydrate in the diet is more important than the amount. Some sources of carbohydrates, such as vegetables, fruits, whole grains, and beans-are healthier than others.   Finally, stay active  Signed, Berniece Salines,  DO  02/18/2022 12:22 PM    [facility]

## 2022-02-19 ENCOUNTER — Telehealth: Payer: Self-pay | Admitting: *Deleted

## 2022-02-19 LAB — CBC
Hematocrit: 37.6 % (ref 34.0–46.6)
Hemoglobin: 12.4 g/dL (ref 11.1–15.9)
MCH: 27.8 pg (ref 26.6–33.0)
MCHC: 33 g/dL (ref 31.5–35.7)
MCV: 84 fL (ref 79–97)
Platelets: 247 10*3/uL (ref 150–450)
RBC: 4.46 x10E6/uL (ref 3.77–5.28)
RDW: 12.2 % (ref 11.7–15.4)
WBC: 7.8 10*3/uL (ref 3.4–10.8)

## 2022-02-19 LAB — LIPID PANEL
Chol/HDL Ratio: 3 ratio (ref 0.0–4.4)
Cholesterol, Total: 161 mg/dL (ref 100–199)
HDL: 54 mg/dL (ref >39)
LDL Chol Calc (NIH): 87 mg/dL (ref 0–99)
Triglycerides: 108 mg/dL (ref 0–149)
VLDL Cholesterol Cal: 20 mg/dL (ref 5–40)

## 2022-02-19 LAB — BASIC METABOLIC PANEL
BUN/Creatinine Ratio: 16 (ref 9–23)
BUN: 10 mg/dL (ref 6–24)
CO2: 25 mmol/L (ref 20–29)
Calcium: 10.1 mg/dL (ref 8.7–10.2)
Chloride: 105 mmol/L (ref 96–106)
Creatinine, Ser: 0.64 mg/dL (ref 0.57–1.00)
Glucose: 118 mg/dL — ABNORMAL HIGH (ref 70–99)
Potassium: 5.2 mmol/L (ref 3.5–5.2)
Sodium: 143 mmol/L (ref 134–144)
eGFR: 106 mL/min/{1.73_m2} (ref >59)

## 2022-02-19 LAB — HEMOGLOBIN A1C
Est. average glucose Bld gHb Est-mCnc: 114 mg/dL
Hgb A1c MFr Bld: 5.6 % (ref 4.8–5.6)

## 2022-02-19 NOTE — Telephone Encounter (Signed)
Cardiac Catheterization scheduled at [facility] for: Thursday February 20, 2022 9 AM Arrival time and place: [facility] Main Entrance A at: 7 AM  Nothing to eat after midnight prior to procedure, clear liquids until 5 AM day of procedure.  Medication instructions: -Hold:  Xarelto-none 02/18/22 until post procedure -Except hold medications usual morning medications can be taken with sips of water including aspirin 81 mg and Plavix 75 mg.  Confirmed patient has responsible adult to drive home post procedure and be with patient first 24 hours after arriving home.  Patient reports no new symptoms concerning for COVID-19 in the past 10 days.  Reviewed procedure instructions with patient.

## 2022-02-20 ENCOUNTER — Encounter (HOSPITAL_COMMUNITY): Payer: Self-pay | Admitting: Cardiovascular Disease

## 2022-02-20 ENCOUNTER — Ambulatory Visit (HOSPITAL_COMMUNITY)
Admission: RE | Disposition: A | Discharge status: Home / Self Care | Payer: Self-pay | Source: Ambulatory Visit | Attending: Cardiovascular Disease

## 2022-02-20 ENCOUNTER — Other Ambulatory Visit: Payer: Self-pay

## 2022-02-20 ENCOUNTER — Ambulatory Visit (HOSPITAL_COMMUNITY)
Admission: RE | Admit: 2022-02-20 | Discharge: 2022-02-20 | Disposition: A | Discharge status: Home / Self Care | Payer: Medicare Other | Source: Ambulatory Visit | Attending: Cardiovascular Disease | Admitting: Cardiovascular Disease

## 2022-02-20 DIAGNOSIS — Z7901 Long term (current) use of anticoagulants: Secondary | ICD-10-CM | POA: Insufficient documentation

## 2022-02-20 DIAGNOSIS — Z7902 Long term (current) use of antithrombotics/antiplatelets: Secondary | ICD-10-CM | POA: Diagnosis not present

## 2022-02-20 DIAGNOSIS — I252 Old myocardial infarction: Secondary | ICD-10-CM | POA: Diagnosis not present

## 2022-02-20 DIAGNOSIS — Z8673 Personal history of transient ischemic attack (TIA), and cerebral infarction without residual deficits: Secondary | ICD-10-CM | POA: Insufficient documentation

## 2022-02-20 DIAGNOSIS — I2510 Atherosclerotic heart disease of native coronary artery without angina pectoris: Secondary | ICD-10-CM

## 2022-02-20 DIAGNOSIS — Z955 Presence of coronary angioplasty implant and graft: Secondary | ICD-10-CM | POA: Insufficient documentation

## 2022-02-20 DIAGNOSIS — I2582 Chronic total occlusion of coronary artery: Secondary | ICD-10-CM | POA: Insufficient documentation

## 2022-02-20 DIAGNOSIS — E785 Hyperlipidemia, unspecified: Secondary | ICD-10-CM | POA: Diagnosis not present

## 2022-02-20 DIAGNOSIS — I10 Essential (primary) hypertension: Secondary | ICD-10-CM | POA: Diagnosis not present

## 2022-02-20 DIAGNOSIS — I25110 Atherosclerotic heart disease of native coronary artery with unstable angina pectoris: Secondary | ICD-10-CM | POA: Diagnosis present

## 2022-02-20 DIAGNOSIS — Z87891 Personal history of nicotine dependence: Secondary | ICD-10-CM | POA: Insufficient documentation

## 2022-02-20 HISTORY — PX: LEFT HEART CATH AND CORONARY ANGIOGRAPHY: CATH118249

## 2022-02-20 SURGERY — LEFT HEART CATH AND CORONARY ANGIOGRAPHY
Anesthesia: LOCAL

## 2022-02-20 MED ORDER — FENTANYL CITRATE (PF) 100 MCG/2ML IJ SOLN
INTRAMUSCULAR | Status: AC
  Filled 2022-02-20: qty 2

## 2022-02-20 MED ORDER — CLOPIDOGREL BISULFATE 75 MG PO TABS: 75.0000 mg | ORAL_TABLET | Freq: Every day | ORAL | Status: DC

## 2022-02-20 MED ORDER — ACETAMINOPHEN 325 MG PO TABS
650.0000 mg | ORAL_TABLET | ORAL | Status: DC | PRN
  Administered 2022-02-20: 650 mg via ORAL
  Filled 2022-02-20: qty 2

## 2022-02-20 MED ORDER — HYDRALAZINE HCL 20 MG/ML IJ SOLN: 10.0000 mg | INTRAMUSCULAR | Status: DC | PRN

## 2022-02-20 MED ORDER — SODIUM CHLORIDE 0.9 % IV SOLN: 250.0000 mL | INTRAVENOUS | Status: DC | PRN

## 2022-02-20 MED ORDER — LIDOCAINE HCL (PF) 1 % IJ SOLN
INTRAMUSCULAR | Status: AC
  Filled 2022-02-20: qty 30

## 2022-02-20 MED ORDER — HEPARIN SODIUM (PORCINE) 1000 UNIT/ML IJ SOLN
INTRAMUSCULAR | Status: DC | PRN
  Administered 2022-02-20: 4500 [IU] via INTRAVENOUS

## 2022-02-20 MED ORDER — ONDANSETRON HCL 4 MG/2ML IJ SOLN: 4.0000 mg | Freq: Four times a day (QID) | INTRAMUSCULAR | Status: DC | PRN

## 2022-02-20 MED ORDER — NITROGLYCERIN 1 MG/10 ML FOR IR/CATH LAB
INTRA_ARTERIAL | Status: AC
  Filled 2022-02-20: qty 10

## 2022-02-20 MED ORDER — HEPARIN (PORCINE) IN NACL 1000-0.9 UT/500ML-% IV SOLN
INTRAVENOUS | Status: AC
  Filled 2022-02-20: qty 1000

## 2022-02-20 MED ORDER — CLOPIDOGREL BISULFATE 75 MG PO TABS
75.0000 mg | ORAL_TABLET | Freq: Every day | ORAL | Status: DC
  Administered 2022-02-20: 75 mg via ORAL
  Filled 2022-02-20: qty 1

## 2022-02-20 MED ORDER — SODIUM CHLORIDE 0.9% FLUSH: 3.0000 mL | INTRAVENOUS | Status: DC | PRN

## 2022-02-20 MED ORDER — FENTANYL CITRATE (PF) 100 MCG/2ML IJ SOLN
INTRAMUSCULAR | Status: DC | PRN
  Administered 2022-02-20 (×2): 25 ug via INTRAVENOUS

## 2022-02-20 MED ORDER — VERAPAMIL HCL 2.5 MG/ML IV SOLN
INTRAVENOUS | Status: DC | PRN
  Administered 2022-02-20: 10 mL via INTRA_ARTERIAL

## 2022-02-20 MED ORDER — ASPIRIN 81 MG PO CHEW: 81.0000 mg | CHEWABLE_TABLET | Freq: Once | ORAL | Status: DC

## 2022-02-20 MED ORDER — SODIUM CHLORIDE 0.9% FLUSH: 3.0000 mL | Freq: Two times a day (BID) | INTRAVENOUS | Status: DC

## 2022-02-20 MED ORDER — VERAPAMIL HCL 2.5 MG/ML IV SOLN
INTRAVENOUS | Status: AC
  Filled 2022-02-20: qty 2

## 2022-02-20 MED ORDER — LABETALOL HCL 5 MG/ML IV SOLN: 10.0000 mg | INTRAVENOUS | Status: DC | PRN

## 2022-02-20 MED ORDER — SODIUM CHLORIDE 0.9 % WEIGHT BASED INFUSION: 1.0000 mL/kg/h | INTRAVENOUS | Status: DC

## 2022-02-20 MED ORDER — DIAZEPAM 5 MG PO TABS: 5.0000 mg | ORAL_TABLET | Freq: Four times a day (QID) | ORAL | Status: DC | PRN

## 2022-02-20 MED ORDER — HEPARIN (PORCINE) IN NACL 1000-0.9 UT/500ML-% IV SOLN
INTRAVENOUS | Status: DC | PRN
  Administered 2022-02-20 (×2): 500 mL

## 2022-02-20 MED ORDER — MIDAZOLAM HCL 2 MG/2ML IJ SOLN
INTRAMUSCULAR | Status: AC
  Filled 2022-02-20: qty 2

## 2022-02-20 MED ORDER — MIDAZOLAM HCL 2 MG/2ML IJ SOLN
INTRAMUSCULAR | Status: DC | PRN
  Administered 2022-02-20 (×2): 1 mg via INTRAVENOUS

## 2022-02-20 MED ORDER — HEPARIN SODIUM (PORCINE) 1000 UNIT/ML IJ SOLN
INTRAMUSCULAR | Status: AC
  Filled 2022-02-20: qty 10

## 2022-02-20 MED ORDER — SODIUM CHLORIDE 0.9 % IV SOLN: INTRAVENOUS | Status: DC

## 2022-02-20 MED ORDER — SODIUM CHLORIDE 0.9 % WEIGHT BASED INFUSION
3.0000 mL/kg/h | INTRAVENOUS | Status: AC
  Administered 2022-02-20: 3 mL/kg/h via INTRAVENOUS

## 2022-02-20 MED ORDER — ISOSORBIDE MONONITRATE ER 60 MG PO TB24: 60.0000 mg | ORAL_TABLET | Freq: Every day | ORAL | 3 refills | Status: DC

## 2022-02-20 SURGICAL SUPPLY — 11 items
CATH INFINITI JR4 5F (CATHETERS) IMPLANT
CATH OPTITORQUE TIG 4.0 5F (CATHETERS) IMPLANT
DEVICE RAD TR BAND REGULAR (VASCULAR PRODUCTS) IMPLANT
GLIDESHEATH SLEND SS 6F .021 (SHEATH) IMPLANT
GUIDEWIRE INQWIRE 1.5J.035X260 (WIRE) IMPLANT
INQWIRE 1.5J .035X260CM (WIRE) ×1
KIT HEART LEFT (KITS) ×1 IMPLANT
PACK CARDIAC CATHETERIZATION (CUSTOM PROCEDURE TRAY) ×1 IMPLANT
SHEATH PROBE COVER 6X72 (BAG) IMPLANT
TRANSDUCER W/STOPCOCK (MISCELLANEOUS) ×1 IMPLANT
TUBING CIL FLEX 10 FLL-RA (TUBING) ×1 IMPLANT

## 2022-02-20 NOTE — Interval H&P Note (Signed)
Cath Lab Visit (complete for each Cath Lab visit)  Clinical Evaluation Leading to the Procedure:   ACS: No.  Non-ACS:    Anginal Classification: CCS III  Anti-ischemic medical therapy: Maximal Therapy (2 or more classes of medications)  Non-Invasive Test Results: No non-invasive testing performed  Prior CABG: No previous CABG      History and Physical Interval Note:  02/20/2022 9:45 AM  Lindsey [name]  has presented today for surgery, with the diagnosis of cad.  The various methods of treatment have been discussed with the patient and family. After consideration of risks, benefits and other options for treatment, the patient has consented to  Procedure(s): LEFT HEART CATH AND CORONARY ANGIOGRAPHY (N/A) as a surgical intervention.  The patient's history has been reviewed, patient examined, no change in status, stable for surgery.  I have reviewed the patient's chart and labs.  Questions were answered to the patient's satisfaction.     Nicki Guadalajara

## 2022-02-24 MED FILL — Lidocaine HCl Local Preservative Free (PF) Inj 1%: INTRAMUSCULAR | Qty: 30 | Status: AC

## 2022-05-13 ENCOUNTER — Encounter: Payer: Self-pay | Admitting: Cardiology

## 2022-05-13 ENCOUNTER — Telehealth: Payer: Self-pay | Admitting: Cardiology

## 2022-05-13 ENCOUNTER — Ambulatory Visit: Payer: 59 | Attending: Cardiology | Admitting: Cardiology

## 2022-05-13 VITALS — BP 122/72 | HR 87 | Ht 66.0 in | Wt 195.8 lb

## 2022-05-13 DIAGNOSIS — E782 Mixed hyperlipidemia: Secondary | ICD-10-CM

## 2022-05-13 DIAGNOSIS — E669 Obesity, unspecified: Secondary | ICD-10-CM

## 2022-05-13 DIAGNOSIS — I2510 Atherosclerotic heart disease of native coronary artery without angina pectoris: Secondary | ICD-10-CM

## 2022-05-13 DIAGNOSIS — I25118 Atherosclerotic heart disease of native coronary artery with other forms of angina pectoris: Secondary | ICD-10-CM | POA: Diagnosis not present

## 2022-05-13 DIAGNOSIS — I10 Essential (primary) hypertension: Secondary | ICD-10-CM | POA: Diagnosis not present

## 2022-05-13 DIAGNOSIS — F17200 Nicotine dependence, unspecified, uncomplicated: Secondary | ICD-10-CM

## 2022-05-13 MED ORDER — RANOLAZINE ER 500 MG PO TB12: 500.0000 mg | ORAL_TABLET | Freq: Two times a day (BID) | ORAL | 3 refills | Status: DC

## 2022-05-13 NOTE — Patient Instructions (Signed)
Medication Instructions:  Your physician has recommended you make the following change in your medication:  START: Ranexa 500 mg twice daily *If you need a refill on your cardiac medications before your next appointment, please call your pharmacy*   Lab Work: None   Testing/Procedures: None   Follow-Up: At [facility], you and your health needs are our priority.  As part of our continuing mission to provide you with exceptional heart care, we have created designated Provider Care Teams.  These Care Teams include your primary Cardiologist (physician) and Advanced Practice Providers (APPs -  Physician Assistants and Nurse Practitioners) who all work together to provide you with the care you need, when you need it.  We recommend signing up for the patient portal called "MyChart".  Sign up information is provided on this After Visit Summary.  MyChart is used to connect with patients for Virtual Visits (Telemedicine).  Patients are able to view lab/test results, encounter notes, upcoming appointments, etc.  Non-urgent messages can be sent to your provider as well.   To learn more about what you can do with MyChart, go to [email or web address].    Your next appointment:   12 week(s)  Provider:   Berniece Salines, DO

## 2022-05-13 NOTE — Progress Notes (Unsigned)
Cardiology Office Note:    Date:  05/14/2022   ID:  Lindsey [name], Lindsey [name] Aug 01, 1970, MRN [id number]  PCP:  Eston Esters (Inactive)  Cardiologist:  Berniece Salines, DO  Electrophysiologist:  None   Referring MD: No ref. provider found   " I am having chest pain that is getting worse"  History of Present Illness:    OLA ROSEMEYER is a 52 y.o. female with a hx of CAD, with hx of apical infarct in 2000, STEMI in 2009 with BMS to the proximal LAD with history of apical thrombus, hypertension, hyperlipidemia, TIA, history of MCA infarct in April 2019 AT III deficiency on anticoagulation, current smoker here today to be evaluated for chest pain.  The patient normally follows with Dr. Kirk Ruths and was last seen by Bunnie Domino, NP back in July 2019.  I saw the patient on February 18, 2022 at that time she experiencing chest discomfort that was concerning.  Sent her for heart catheterization.  Heart catheterization showed evidence that her stent had been occluded with collateral circulation noted at that time.  She tells me that she still is experiencing chest pain on exertion.  She is concerned about this.   Past Medical History:  Diagnosis Date   Anemia    Antithrombin III deficiency ([place])    ?pt not sure if true diagnosis   Anxiety    Apical mural thrombus    Blood transfusion    Cholecystitis 07/2016   Coronary artery disease    a. apical LAD infarction '00. b. NSTEMI s/p BMS to prox LAD '09. c. Cath 01/2015: stable LAD stent, otherwise minimal nonobstructive CAD.   GERD (gastroesophageal reflux disease)    Hyperlipidemia    Hypertension    Myocardial infarction ([place])    Noncompliance with medication regimen    a. h/o noncompliance with med regimen (previous running out of Coumadin).   Pancreatitis    Pancreatitis, acute    Stroke (cerebrum) ([place]) 08/2017   Stroke ([place])    assoc with short term memory loss and right peripheral vision loss; age 22    TIA  (transient ischemic attack) 2010   Tobacco abuse     Past Surgical History:  Procedure Laterality Date   CARDIAC CATHETERIZATION     CARDIAC CATHETERIZATION N/A 01/29/2015   Procedure: Left Heart Cath and Coronary Angiography;  Surgeon: Peter M Martinique, MD;  Location: [place] [facility];  Service: Cardiovascular;  Laterality: N/A;   CHOLECYSTECTOMY N/A 08/14/2016   Procedure: LAPAROSCOPIC CHOLECYSTECTOMY;  Surgeon: Greer Pickerel, MD;  Location: [facility];  Service: General;  Laterality: N/A;   COLONOSCOPY N/A 03/29/2014   Procedure: COLONOSCOPY;  Surgeon: Inda Castle, MD;  Location: [facility];  Service: Endoscopy;  Laterality: N/A;   CORONARY ANGIOPLASTY     ESOPHAGOGASTRODUODENOSCOPY N/A 06/09/2012   Procedure: ESOPHAGOGASTRODUODENOSCOPY (EGD);  Surgeon: Beryle Beams, MD;  Location: [facility];  Service: Endoscopy;  Laterality: N/A;   ESOPHAGOGASTRODUODENOSCOPY N/A 08/02/2013   Procedure: ESOPHAGOGASTRODUODENOSCOPY (EGD);  Surgeon: Ladene Artist, MD;  Location[facility];  Service: Endoscopy;  Laterality: N/A;   IR ANGIO INTRA EXTRACRAN SEL COM CAROTID INNOMINATE UNI R MOD SED  07/08/2017   LEFT HEART CATH AND CORONARY ANGIOGRAPHY N/A 02/20/2022   Procedure: LEFT HEART CATH AND CORONARY ANGIOGRAPHY;  Surgeon: Troy Sine, MD;  Location: [place] [facility];  Service: Cardiovascular;  Laterality: N/A;   LEFT HEART CATHETERIZATION WITH CORONARY ANGIOGRAM N/A 12/24/2011   Procedure: LEFT HEART CATHETERIZATION WITH  CORONARY ANGIOGRAM;  Surgeon: Burnell Blanks, MD;  Location: [facility] C[facility] LAB;  Service: Cardiovascular;  Laterality: N/A;   RADIOLOGY WITH ANESTHESIA N/A 07/08/2017   Procedure: IR WITH ANESTHESIA;  Surgeon: Radiologist, Medication, MD;  Location: [place];  Service: Radiology;  Laterality: N/A;   TUBAL LIGATION      Current Medications: Current Meds  Medication Sig   amLODipine (NORVASC) 10 MG tablet Take 1 tablet (10 mg total) by mouth daily.   atorvastatin (LIPITOR)  40 MG tablet Take 1 tablet (40 mg total) by mouth daily.   clopidogrel (PLAVIX) 75 MG tablet Take 1 tablet (75 mg total) by mouth daily.   diclofenac Sodium (VOLTAREN) 1 % GEL Apply 4 g topically 4 (four) times daily.   HYDROcodone-acetaminophen (NORCO/VICODIN) 5-325 MG tablet Take 1-2 tablets by mouth every 6 (six) hours as needed.   isosorbide mononitrate (IMDUR) 60 MG 24 hr tablet Take 1 tablet (60 mg total) by mouth daily.   nitroGLYCERIN (NITROSTAT) 0.4 MG SL tablet Place 1 tablet (0.4 mg total) under the tongue every 5 (five) minutes as needed for chest pain (up to 3 doses).   ranolazine (RANEXA) 500 MG 12 hr tablet Take 1 tablet (500 mg total) by mouth 2 (two) times daily.   rivaroxaban (XARELTO) 20 MG TABS tablet Take 1 tablet (20 mg total) by mouth daily.   traZODone (DESYREL) 50 MG tablet TAKE 1 Tablet BY MOUTH ONCE DAILY at bedtime (bedtime)     Allergies:   Patient has no known allergies.   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  Tobacco Use   Smoking status: Former    Packs/day: 0.20    Years: 1.00    Total pack years: 0.20    Types: Cigarettes    Quit date: 10/27/2018    Years since quitting: 3.5   Smokeless tobacco: Never   Tobacco comments:    2018  " i STILL SMOKE 3  CIGARETTES A DAY "  Vaping Use   Vaping Use: Former  Substance and Sexual Activity   Alcohol use: Yes    Alcohol/week: 0.0 standard drinks of alcohol    Comment: Social drinker   Drug use: Not Currently    Types: Cocaine    Comment: hx of use x 1 in the past   Sexual activity: Not Currently    Birth control/protection: Surgical  Other Topics Concern   Not on file  Social History Narrative   Not on file   Social Determinants of Health   Financial Resource Strain:  Not on file  Food Insecurity: Not on file  Transportation Needs: Not on file  Physical Activity: Not on file  Stress: Not on file  Social Connections: Not on file     Family History: The patient's family history includes Breast cancer in her maternal aunt; Heart disease in her brother; Stroke in her father.  ROS:   Review of Systems  Constitution: Negative for decreased appetite, fever and weight gain.  HENT: Negative for congestion, ear discharge, hoarse voice and sore throat.   Eyes: Negative for discharge, redness, vision loss in right eye and visual halos.  Cardiovascular: Reports chest pain.negative for  dyspnea on exertion, leg swelling, orthopnea and palpitations.  Respiratory:  Negative for cough, hemoptysis, shortness of breath and snoring.   Endocrine: Negative for heat intolerance and polyphagia.  Hematologic/Lymphatic: Negative for bleeding problem. Does not bruise/bleed easily.  Skin: Negative for flushing, nail changes, rash and suspicious lesions.  Musculoskeletal: Negative for arthritis, joint pain, muscle cramps, myalgias, neck pain and stiffness.  Gastrointestinal: Negative for abdominal pain, bowel incontinence, diarrhea and excessive appetite.  Genitourinary: Negative for decreased libido, genital sores and incomplete emptying.  Neurological: Negative for brief paralysis, focal weakness, headaches and loss of balance.  Psychiatric/Behavioral: Negative for altered mental status, depression and suicidal ideas.  Allergic/Immunologic: Negative for HIV exposure and persistent infections.    EKGs/Labs/Other Studies Reviewed:    The following studies were reviewed today:   EKG:  None today  LHC 02/20/2022   Prox LAD to Mid LAD lesion is 100% stenosed.   The left ventricular ejection fraction is 50-55% by visual estimate.   Total occlusion of the proximal LAD at the proximal stent site.   Normal large ramus intermediate vessel; normal left circumflex coronary  artery, and normal dominant RCA with extensive collateralization to the LAD to the RCA up to the distal portion of the stented segment.   There is mild mid inferior hypocontractility with suggestion of mild LVH and EF estimated at 50 to 55%.  LVEDP 18 mmHg.   RECOMMENDATION: Medical therapy with well collateralized LAD from the RCA. Continue Plavix and resume Xarelto commencing tomorrow.  Aggressive lipid-lowering therapy with target LDL less than 70.  Will titrate isosorbide to 60 mg.  Continue additional antiischemic medical therapy.  Of note, the patient had been on combined antiplatelet and anticoagulation therapy and had run out of her Plavix approximately 1 month ago and 2 weeks later most likely occluded her proximal LAD stent on Xarelto.     Nuclear stress test July 2019 The left ventricular ejection fraction is mildly decreased (45-54%). Nuclear stress EF: 54%. No T wave inversion was noted during stress. There was no ST segment deviation noted during stress. Defect 1: There is a medium defect of severe severity. Findings consistent with prior myocardial infarction. This is a low risk study.  Medium size, severe intensity fixed apical perfusion defect, suggestive of scar. No reversible ischemia. LVEF 54% with apical dyskinesis. This is a low risk study.  TTE 07/09/2017 Study Conclusions  - Left ventricle: The cavity size was normal. Wall thickness was    increased in a pattern of mild LVH. Systolic function was normal.    The estimated ejection fraction was in the range of 55% to 60%.    Akinesis of the apical myocardium. Doppler parameters are    consistent with abnormal left ventricular relaxation (grade 1    diastolic dysfunction). There was a medium-sized, fixed,  apicalthrombus.  - Left atrium: The atrium was mildly dilated.    Cardiac MR 01/2015 FINDINGS: The atria were normal in size. RV normal in size and function. Mild MR. Normal aortic valve. Trivial pericardial  effusion No ASD/VSD. The distal anterior wall, septum and entire apex were akinetic   The quantitative EF was 50% with normal mid and basal function. Mild LVH septal thickness 12 mm.   Delayed enhancement images with gadolinium showed full thickness scar involving the distal anterior wall, septum and apex. Long TI imaging post contrast showed a small amount   Of mural apical thrombus   IMPRESSION: 1) Mild LVE/LVH EF 50% with distal anterior wall, septal and apical akinesis   2) Post Gadolinium images with full  thickness scar involving above areas   3) Long TI inversion recovery sequences post contrast show a small amount of mural apical thrombus   4) Mild MR   5) Trivial pericardial effusion   Jenkins Rouge     Electronically Signed   By: Jenkins Rouge M.D.   On: 01/31/2015 16:49    LHC 2016 Prox RCA lesion, 20% stenosed. Mid LAD to Dist LAD lesion, 20% stenosed. There is mild left ventricular systolic dysfunction.   1. Minimal nonobstructive CAD. Patent stent in the LAD 2. Mild LV dysfunction with chronic akinesis of the apex. 3. Complex filling defect in the LV apex consistent with thrombus.   Plan: resume full anticoagulation. Will assess with Echo.   Recent Labs: 12/13/2021: ALT 25 02/18/2022: BUN 10; Creatinine, Ser 0.64; Hemoglobin 12.4; Platelets 247; Potassium 5.2; Sodium 143  Recent Lipid Panel    Component Value Date/Time   CHOL 161 02/18/2022 1140   TRIG 108 02/18/2022 1140   HDL 54 02/18/2022 1140   CHOLHDL 3.0 02/18/2022 1140   CHOLHDL 2.2 11/03/2018 0834   VLDL 13 11/03/2018 0834   LDLCALC 87 02/18/2022 1140    Physical Exam:    VS:  BP 122/72 (BP Location: Right Arm, Patient Position: Sitting, Cuff Size: Normal)   Pulse 87   Ht '5\' 6"'$  (1.676 m)   Wt 88.8 kg   LMP 04/02/2015   SpO2 97%   BMI 31.60 kg/m     Wt Readings from Last 3 Encounters:  05/13/22 88.8 kg  02/20/22 85.7 kg  02/18/22 86 kg     GEN: Well nourished, well  developed in no acute distress HEENT: Normal NECK: No JVD; No carotid bruits LYMPHATICS: No lymphadenopathy CARDIAC: S1S2 noted,RRR, no murmurs, rubs, gallops RESPIRATORY:  Clear to auscultation without rales, wheezing or rhonchi  ABDOMEN: Soft, non-tender, non-distended, +bowel sounds, no guarding. EXTREMITIES: No edema, No cyanosis, no clubbing MUSCULOSKELETAL:  No deformity  SKIN: Warm and dry NEUROLOGIC:  Alert and oriented x 3, non-focal PSYCHIATRIC:  Normal affect, good insight  ASSESSMENT:    1. Coronary artery disease involving native coronary artery of native heart without angina pectoris   2. Atherosclerosis of native coronary artery of native heart with stable angina pectoris ([place])   3. Essential hypertension   4. Mixed hyperlipidemia   5. Tobacco use disorder   6. Obesity (BMI 30-39.9)     PLAN:     Recent heart catheterization no indication for PCI.  She is experiencing chest discomfort she is currently on Imdur 60 mg of antianginals we will add Ranexa 500 mg twice daily. Continue Plavix, continue statin.  Her blood pressure is at target in office no changes will be made.  Hyperlipidemia - continue with current statin medication.  Smoking cessation advised today in the office.   The patient is in agreement with the above plan. The patient left the office in stable condition.  The patient will follow up in 12 weeks post cath with Dr. Stanford Breed.   Medication Adjustments/Labs and Tests Ordered: Current medicines are reviewed at length with the patient today.  Concerns regarding medicines are outlined above.  Orders Placed This Encounter  Procedures   AMB referral to cardiac rehabilitation   Meds ordered this encounter  Medications   ranolazine (RANEXA) 500 MG 12 hr tablet    Sig: Take 1 tablet (500 mg total) by mouth 2 (two) times daily.    Dispense:  180 tablet    Refill:  3  Patient Instructions  Medication Instructions:  Your physician has  recommended you make the following change in your medication:  START: Ranexa 500 mg twice daily *If you need a refill on your cardiac medications before your next appointment, please call your pharmacy*   Lab Work: None   Testing/Procedures: None   Follow-Up: At [facility], you and your health needs are our priority.  As part of our continuing mission to provide you with exceptional heart care, we have created designated Provider Care Teams.  These Care Teams include your primary Cardiologist (physician) and Advanced Practice Providers (APPs -  Physician Assistants and Nurse Practitioners) who all work together to provide you with the care you need, when you need it.  We recommend signing up for the patient portal called "MyChart".  Sign up information is provided on this After Visit Summary.  MyChart is used to connect with patients for Virtual Visits (Telemedicine).  Patients are able to view lab/test results, encounter notes, upcoming appointments, etc.  Non-urgent messages can be sent to your provider as well.   To learn more about what you can do with MyChart, go to [email or web address].    Your next appointment:   12 week(s)  Provider:   Berniece Salines, DO     Adopting a Healthy Lifestyle.  Know what a healthy weight is for you (roughly BMI <25) and aim to maintain this   Aim for 7+ servings of fruits and vegetables daily   65-80+ fluid ounces of water or unsweet tea for healthy kidneys   Limit to max 1 drink of alcohol per day; avoid smoking/tobacco   Limit animal fats in diet for cholesterol and heart health - choose grass fed whenever available   Avoid highly processed foods, and foods high in saturated/trans fats   Aim for low stress - take time to unwind and care for your mental health   Aim for 150 min of moderate intensity exercise weekly for heart health, and weights twice weekly for bone health   Aim for 7-9 hours of sleep daily   When it comes  to diets, agreement about the perfect plan isnt easy to find, even among the experts. Experts at the [employer] developed an idea known as the Healthy Eating Plate. Just imagine a plate divided into logical, healthy portions.   The emphasis is on diet quality:   Load up on vegetables and fruits - one-half of your plate: Aim for color and variety, and remember that potatoes dont count.   Go for whole grains - one-quarter of your plate: Whole wheat, barley, wheat berries, quinoa, oats, brown rice, and foods made with them. If you want pasta, go with whole wheat pasta.   Protein power - one-quarter of your plate: Fish, chicken, beans, and nuts are all healthy, versatile protein sources. Limit red meat.   The diet, however, does go beyond the plate, offering a few other suggestions.   Use healthy plant oils, such as olive, canola, soy, corn, sunflower and peanut. Check the labels, and avoid partially hydrogenated oil, which have unhealthy trans fats.   If youre thirsty, drink water. Coffee and tea are good in moderation, but skip sugary drinks and limit milk and dairy products to one or two daily servings.   The type of carbohydrate in the diet is more important than the amount. Some sources of carbohydrates, such as vegetables, fruits, whole grains, and beans-are healthier than others.   Finally, stay active  Signed,  Berniece Salines, DO  05/14/2022 8:10 PM    [facility]

## 2022-05-13 NOTE — Telephone Encounter (Signed)
Patient is requesting primary cardiologist be switched from [place] to Dr.Tobb - patient has already had f/u visit with Dr.Tobb. Entering note for documentation.

## 2022-06-11 ENCOUNTER — Telehealth (HOSPITAL_COMMUNITY): Payer: Self-pay

## 2022-06-11 NOTE — Telephone Encounter (Signed)
Pt insurance is active and benefits verified through [facility] [employer]. Co-pay $0.00, DED $240.00/$240.00 met, out of pocket $8,850.00/$385.77 met, co-insurance 20%. No pre-authorization required. Passport, 06/11/22 @ 2:16PM, [id number]   How many CR sessions are covered? (36 sessions for TCR, 72 sessions for ICR)72 Is this a lifetime maximum or an annual maximum? Annual Has the member used any of these services to date? No Is there a time limit (weeks/months) on start of program and/or program completion? No     Will contact patient to see if she is interested in the Cardiac Rehab Program.

## 2022-06-11 NOTE — Telephone Encounter (Signed)
Called patient to see if she was interested in participating in the Cardiac Rehab Program. Patient stated yes. Patient will come in for orientation on 06/18/22 @ 10AM and will attend the 145PM exercise class.   Tourist information centre manager.

## 2022-06-18 ENCOUNTER — Encounter (HOSPITAL_COMMUNITY)
Admission: RE | Admit: 2022-06-18 | Discharge: 2022-06-18 | Disposition: A | Discharge status: Home / Self Care | Payer: 59 | Source: Ambulatory Visit | Attending: Cardiology | Admitting: Cardiology

## 2022-06-18 VITALS — BP 104/68 | HR 71 | Ht 66.0 in | Wt 196.9 lb

## 2022-06-18 DIAGNOSIS — Z5189 Encounter for other specified aftercare: Secondary | ICD-10-CM | POA: Insufficient documentation

## 2022-06-18 DIAGNOSIS — I2089 Other forms of angina pectoris: Secondary | ICD-10-CM | POA: Diagnosis present

## 2022-06-18 NOTE — Progress Notes (Signed)
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 and body. The benefits of yoga include improved flexibility, improved range of motion, better posture and core strength, increased lung function, weight loss, and positive self-image. Yoga's heart health benefits include lowered blood pressure, healthier heart rate, decreased cholesterol and triglyceride levels, improved immune function, and reduced stress. Seek guidance from your physician and exercise physiologist before implementing an exercise routine and learn your capabilities and proper form for all exercise.  Medical   Aging: Enhancing Your Quality of Life  Clinical staff conducted group or individual video education with verbal and written material and guidebook.  Patient learns key strategies and recommendations to stay in good physical health  and enhance quality of life, such as prevention strategies, having an advocate, securing a [place], and keeping a list of medications and system for tracking them. It also discusses how to avoid risk for bone loss.  Biology of Weight Control  Clinical staff conducted group or individual video education with verbal and written material and guidebook.  Patient learns that weight gain occurs because we consume more calories than we burn (eating more, moving less). Even if your body weight is normal, you may have higher ratios of fat compared to muscle mass. Too much body fat puts you at increased risk for cardiovascular disease, heart attack, stroke, type 2 diabetes, and obesity-related cancers. In addition to exercise, following the [place] can help reduce your risk.  Decoding Lab Results  Clinical staff conducted group or individual video education with verbal and written material and guidebook.  Patient learns that lab test reflects one measurement whose values change over time and are influenced by many factors, including medication, stress, sleep, exercise, food, hydration, pre-existing medical conditions, and more. It is recommended to use the knowledge from this video to become more involved with your lab results and evaluate your numbers to speak with your doctor.   Diseases of Our Time - Overview  Clinical staff conducted group or individual video education with verbal and written material and guidebook.  Patient learns that according to the CDC, 50% to 70% of chronic diseases (such as obesity, type 2 diabetes, elevated lipids, hypertension, and heart disease) are avoidable through lifestyle improvements including healthier food choices, listening to satiety cues, and increased physical activity.  Sleep Disorders Clinical staff conducted group or individual video education with verbal and written material and guidebook.  Patient learns how good  quality and duration of sleep are important to overall health and well-being. Patient also learns about sleep disorders and how they impact health along with recommendations to address them, including discussing with a physician.  Nutrition  Dining Out - Part 2 Clinical staff conducted group or individual video education with verbal and written material and guidebook.  Patient learns how to  plan ahead and communicate in order to maximize their dining experience in a healthy and nutritious manner. Included are recommended food choices based on the type of restaurant the patient is visiting.   Fueling a Best boy conducted group or individual video education with verbal and written material and guidebook.  There is a strong connection between our food choices and our health. Diseases like obesity and type 2 diabetes are very prevalent and are in large-part due to lifestyle choices. The Pritikin Eating Plan provides plenty of food and hunger-curbing satisfaction. It is easy to follow, affordable, and helps reduce health risks.  Menu Workshop  Clinical staff conducted group or individual video education with verbal and written material and guidebook.  Patient learns that restaurant meals can sabotage health goals because they are often packed with calories, fat, sodium, and sugar. Recommendations include strategies to plan ahead and to communicate with the manager, chef, or server to help order a healthier meal.  Planning Your Eating Strategy  Clinical staff conducted group or individual video education with verbal and written material and guidebook.  Patient learns about the [place] and its benefit of reducing the risk of disease. The [place] does not focus on calories. Instead, it emphasizes high-quality, nutrient-rich foods. By knowing the characteristics of the foods, we choose, we can determine their calorie density and make informed  decisions.  Targeting Your Nutrition Priorities  Clinical staff conducted group or individual video education with verbal and written material and guidebook.  Patient learns that lifestyle habits have a tremendous impact on disease risk and progression. This video provides eating and physical activity recommendations based on your personal health goals, such as reducing LDL cholesterol, losing weight, preventing or controlling type 2 diabetes, and reducing high blood pressure.  Vitamins and Minerals  Clinical staff conducted group or individual video education with verbal and written material and guidebook.  Patient learns different ways to obtain key vitamins and minerals, including through a recommended healthy diet. It is important to discuss all supplements you take with your doctor.   Healthy Mind-Set    Smoking Cessation  Clinical staff conducted group or individual video education with verbal and written material and guidebook.  Patient learns that cigarette smoking and tobacco addiction pose a serious health risk which affects millions of people. Stopping smoking will significantly reduce the risk of heart disease, lung disease, and many forms of cancer. Recommended strategies for quitting are covered, including working with your doctor to develop a successful plan.  Culinary   Becoming a Financial trader conducted group or individual video education with verbal and written material and guidebook.  Patient learns that cooking at home can be healthy, cost-effective, quick, and puts them in control. Keys to cooking healthy recipes will include looking at your recipe, assessing your equipment needs, planning ahead, making it simple, choosing cost-effective seasonal ingredients, and limiting the use of added fats, salts, and sugars.  Cooking - Breakfast and Snacks  Clinical staff conducted group or individual video education with verbal and written material and guidebook.   Patient learns how important breakfast is to satiety and nutrition through the entire day. Recommendations include key foods to eat during breakfast to help stabilize blood sugar levels and to prevent overeating at meals later in the day. Planning ahead is also a key component.  Cooking - Human resources officer conducted group or individual video education with verbal and written material and  guidebook.  Patient learns eating strategies to improve overall health, including an approach to cook more at home. Recommendations include thinking of animal protein as a side on your plate rather than center stage and focusing instead on lower calorie dense options like vegetables, fruits, whole grains, and plant-based proteins, such as beans. Making sauces in large quantities to freeze for later and leaving the skin on your vegetables are also recommended to maximize your experience.  Cooking - Healthy Salads and Dressing Clinical staff conducted group or individual video education with verbal and written material and guidebook.  Patient learns that vegetables, fruits, whole grains, and legumes are the foundations of the [place]. Recommendations include how to incorporate each of these in flavorful and healthy salads, and how to create homemade salad dressings. Proper handling of ingredients is also covered. Cooking - Soups and [employer] - Soups and Desserts Clinical staff conducted group or individual video education with verbal and written material and guidebook.  Patient learns that Pritikin soups and desserts make for easy, nutritious, and delicious snacks and meal components that are low in sodium, fat, sugar, and calorie density, while high in vitamins, minerals, and filling fiber. Recommendations include simple and healthy ideas for soups and desserts.   Overview     The Pritikin Solution Program Overview Clinical staff conducted group or individual video education with  verbal and written material and guidebook.  Patient learns that the results of the [place] Program have been documented in more than 100 articles published in peer-reviewed journals, and the benefits include reducing risk factors for (and, in some cases, even reversing) high cholesterol, high blood pressure, type 2 diabetes, obesity, and more! An overview of the three key pillars of the Pritikin Program will be covered: eating well, doing regular exercise, and having a healthy mind-set.  WORKSHOPS  Exercise: Exercise Basics: Building Your Action Plan Clinical staff led group instruction and group discussion with PowerPoint presentation and patient guidebook. To enhance the learning environment the use of posters, models and videos may be added. At the conclusion of this workshop, patients will comprehend the difference between physical activity and exercise, as well as the benefits of incorporating both, into their routine. Patients will understand the FITT (Frequency, Intensity, Time, and Type) principle and how to use it to build an exercise action plan. In addition, safety concerns and other considerations for exercise and cardiac rehab will be addressed by the presenter. The purpose of this lesson is to promote a comprehensive and effective weekly exercise routine in order to improve patients' overall level of fitness.   Managing Heart Disease: Your Path to a Healthier Heart Clinical staff led group instruction and group discussion with PowerPoint presentation and patient guidebook. To enhance the learning environment the use of posters, models and videos may be added.At the conclusion of this workshop, patients will understand the anatomy and physiology of the heart. Additionally, they will understand how Pritikin's three pillars impact the risk factors, the progression, and the management of heart disease.  The purpose of this lesson is to provide a high-level overview of the heart, heart  disease, and how the Pritikin lifestyle positively impacts risk factors.  Exercise Biomechanics Clinical staff led group instruction and group discussion with PowerPoint presentation and patient guidebook. To enhance the learning environment the use of posters, models and videos may be added. Patients will learn how the structural parts of their bodies function and how these functions impact their daily activities, movement, and exercise.  Patients will learn how to promote a neutral spine, learn how to manage pain, and identify ways to improve their physical movement in order to promote healthy living. The purpose of this lesson is to expose patients to common physical limitations that impact physical activity. Participants will learn practical ways to adapt and manage aches and pains, and to minimize their effect on regular exercise. Patients will learn how to maintain good posture while sitting, walking, and lifting.  Balance Training and Fall Prevention  Clinical staff led group instruction and group discussion with PowerPoint presentation and patient guidebook. To enhance the learning environment the use of posters, models and videos may be added. At the conclusion of this workshop, patients will understand the importance of their sensorimotor skills (vision, proprioception, and the vestibular system) in maintaining their ability to balance as they age. Patients will apply a variety of balancing exercises that are appropriate for their current level of function. Patients will understand the common causes for poor balance, possible solutions to these problems, and ways to modify their physical environment in order to minimize their fall risk. The purpose of this lesson is to teach patients about the importance of maintaining balance as they age and ways to minimize their risk of falling.  WORKSHOPS   Nutrition:  Fueling a Scientist, research (physical sciences) led group instruction and group  discussion with PowerPoint presentation and patient guidebook. To enhance the learning environment the use of posters, models and videos may be added. Patients will review the foundational principles of the [place] and understand what constitutes a serving size in each of the food groups. Patients will also learn Pritikin-friendly foods that are better choices when away from home and review make-ahead meal and snack options. Calorie density will be reviewed and applied to three nutrition priorities: weight maintenance, weight loss, and weight gain. The purpose of this lesson is to reinforce (in a group setting) the key concepts around what patients are recommended to eat and how to apply these guidelines when away from home by planning and selecting Pritikin-friendly options. Patients will understand how calorie density may be adjusted for different weight management goals.  Mindful Eating  Clinical staff led group instruction and group discussion with PowerPoint presentation and patient guidebook. To enhance the learning environment the use of posters, models and videos may be added. Patients will briefly review the concepts of the [place] and the importance of low-calorie dense foods. The concept of mindful eating will be introduced as well as the importance of paying attention to internal hunger signals. Triggers for non-hunger eating and techniques for dealing with triggers will be explored. The purpose of this lesson is to provide patients with the opportunity to review the basic principles of the [place], discuss the value of eating mindfully and how to measure internal cues of hunger and fullness using the Hunger Scale. Patients will also discuss reasons for non-hunger eating and learn strategies to use for controlling emotional eating.  Targeting Your Nutrition Priorities Clinical staff led group instruction and group discussion with PowerPoint presentation and  patient guidebook. To enhance the learning environment the use of posters, models and videos may be added. Patients will learn how to determine their genetic susceptibility to disease by reviewing their family history. Patients will gain insight into the importance of diet as part of an overall healthy lifestyle in mitigating the impact of genetics and other environmental insults. The purpose of this lesson is to provide patients with  the opportunity to assess their personal nutrition priorities by looking at their family history, their own health history and current risk factors. Patients will also be able to discuss ways of prioritizing and modifying the [place] for their highest risk areas  Menu  Clinical staff led group instruction and group discussion with PowerPoint presentation and patient guidebook. To enhance the learning environment the use of posters, models and videos may be added. Using menus brought in from [employer], or printed from [employer], patients will apply the [place] dining out guidelines that were presented in the [employer] video. Patients will also be able to practice these guidelines in a variety of provided scenarios. The purpose of this lesson is to provide patients with the opportunity to practice hands-on learning of the [place] with actual menus and practice scenarios.  Label Reading Clinical staff led group instruction and group discussion with PowerPoint presentation and patient guidebook. To enhance the learning environment the use of posters, models and videos may be added. Patients will review and discuss the Pritikin label reading guidelines presented in Pritikin's Label Reading Educational series video. Using fool labels brought in from local grocery stores and markets, patients will apply the label reading guidelines and determine if the packaged food meet the Pritikin guidelines. The purpose of this  lesson is to provide patients with the opportunity to review, discuss, and practice hands-on learning of the Pritikin Label Reading guidelines with actual packaged food labels. [place] Workshops are designed to teach patients ways to prepare quick, simple, and affordable recipes at home. The importance of nutrition's role in chronic disease risk reduction is reflected in its emphasis in the overall Pritikin program. By learning how to prepare essential core Pritikin Eating Plan recipes, patients will increase control over what they eat; be able to customize the flavor of foods without the use of added salt, sugar, or fat; and improve the quality of the food they consume. By learning a set of core recipes which are easily assembled, quickly prepared, and affordable, patients are more likely to prepare more healthy foods at home. These workshops focus on convenient breakfasts, simple entres, side dishes, and desserts which can be prepared with minimal effort and are consistent with nutrition recommendations for cardiovascular risk reduction. Cooking [employer] are taught by a Engineer, materials (RD) who has been trained by the [employer]. The chef or RD has a clear understanding of the importance of minimizing - if not completely eliminating - added fat, sugar, and sodium in recipes. Throughout the series of [place] Workshop sessions, patients will learn about healthy ingredients and efficient methods of cooking to build confidence in their capability to prepare    Cooking School weekly topics:  Adding Flavor- Sodium-Free  Fast and Healthy Breakfasts  Powerhouse Plant-Based Proteins  Satisfying Salads and Dressings  Simple Sides and Sauces  International Cuisine-Spotlight on the [place] Zones  Delicious Desserts  Savory Soups  [employer] - Meals in a Agricultural consultant Appetizers and Snacks  Comforting Weekend Breakfasts  One-Pot  Wonders   Fast Evening Meals  Contractor Your Pritikin Plate  WORKSHOPS   Healthy Mindset (Psychosocial):  Focused Goals, Sustainable Changes Clinical staff led group instruction and group discussion with PowerPoint presentation and patient guidebook. To enhance the learning environment the use of posters, models and videos may be added. Patients will be able to apply effective goal  setting strategies to establish at least one personal goal, and then take consistent, meaningful action toward that goal. They will learn to identify common barriers to achieving personal goals and develop strategies to overcome them. Patients will also gain an understanding of how our mind-set can impact our ability to achieve goals and the importance of cultivating a positive and growth-oriented mind-set. The purpose of this lesson is to provide patients with a deeper understanding of how to set and achieve personal goals, as well as the tools and strategies needed to overcome common obstacles which may arise along the way.  From Head to Heart: The Power of a Healthy Outlook  Clinical staff led group instruction and group discussion with PowerPoint presentation and patient guidebook. To enhance the learning environment the use of posters, models and videos may be added. Patients will be able to recognize and describe the impact of emotions and mood on physical health. They will discover the importance of self-care and explore self-care practices which may work for them. Patients will also learn how to utilize the 4 C's to cultivate a healthier outlook and better manage stress and challenges. The purpose of this lesson is to demonstrate to patients how a healthy outlook is an essential part of maintaining good health, especially as they continue their cardiac rehab journey.  Healthy Sleep for a Healthy Heart Clinical staff led group instruction and group discussion with PowerPoint presentation and  patient guidebook. To enhance the learning environment the use of posters, models and videos may be added. At the conclusion of this workshop, patients will be able to demonstrate knowledge of the importance of sleep to overall health, well-being, and quality of life. They will understand the symptoms of, and treatments for, common sleep disorders. Patients will also be able to identify daytime and nighttime behaviors which impact sleep, and they will be able to apply these tools to help manage sleep-related challenges. The purpose of this lesson is to provide patients with a general overview of sleep and outline the importance of quality sleep. Patients will learn about a few of the most common sleep disorders. Patients will also be introduced to the concept of "sleep hygiene," and discover ways to self-manage certain sleeping problems through simple daily behavior changes. Finally, the workshop will motivate patients by clarifying the links between quality sleep and their goals of heart-healthy living.   Recognizing and Reducing Stress Clinical staff led group instruction and group discussion with PowerPoint presentation and patient guidebook. To enhance the learning environment the use of posters, models and videos may be added. At the conclusion of this workshop, patients will be able to understand the types of stress reactions, differentiate between acute and chronic stress, and recognize the impact that chronic stress has on their health. They will also be able to apply different coping mechanisms, such as reframing negative self-talk. Patients will have the opportunity to practice a variety of stress management techniques, such as deep abdominal breathing, progressive muscle relaxation, and/or guided imagery.  The purpose of this lesson is to educate patients on the role of stress in their lives and to provide healthy techniques for coping with it.  Learning Barriers/Preferences:  Learning  Barriers/Preferences - 06/18/22 1205       Learning Barriers/Preferences   Learning Barriers Sight   wears glasses   Learning Preferences Audio;Computer/Internet;Group Instruction;Individual Instruction;Skilled Demonstration;Verbal Instruction;Video;Written Material;Pictoral             Education Topics:  Knowledge Questionnaire Score:  Knowledge Questionnaire Score -  06/18/22 1205       Knowledge Questionnaire Score   Pre Score 20/24             Core Components/Risk Factors/Patient Goals at Admission:  Personal Goals and Risk Factors at Admission - 06/18/22 1206       Core Components/Risk Factors/Patient Goals on Admission    Weight Management Yes;Obesity;Weight Loss    Intervention Obesity: Provide education and appropriate resources to help participant work on and attain dietary goals.;Weight Management/Obesity: Establish reasonable short term and long term weight goals.;Weight Management: Provide education and appropriate resources to help participant work on and attain dietary goals.;Weight Management: Develop a combined nutrition and exercise program designed to reach desired caloric intake, while maintaining appropriate intake of nutrient and fiber, sodium and fats, and appropriate energy expenditure required for the weight goal.    Admit Weight 196 lb 13.9 oz (89.3 kg)    Goal Weight: Long Term 150 lb (68 kg)    Expected Outcomes Short Term: Continue to assess and modify interventions until short term weight is achieved;Long Term: Adherence to nutrition and physical activity/exercise program aimed toward attainment of established weight goal;Weight Loss: Understanding of general recommendations for a balanced deficit meal plan, which promotes 1-2 lb weight loss per week and includes a negative energy balance of [phone] kcal/d;Understanding recommendations for meals to include 15-35% energy as protein, 25-35% energy from fat, 35-60% energy from carbohydrates, less than  200mg  of dietary cholesterol, 20-35 gm of total fiber daily;Understanding of distribution of calorie intake throughout the day with the consumption of 4-5 meals/snacks    Hypertension Yes    Intervention Provide education on lifestyle modifcations including regular physical activity/exercise, weight management, moderate sodium restriction and increased consumption of fresh fruit, vegetables, and low fat dairy, alcohol moderation, and smoking cessation.;Monitor prescription use compliance.    Expected Outcomes Short Term: Continued assessment and intervention until BP is < 140/33mm HG in hypertensive participants. < 130/27mm HG in hypertensive participants with diabetes, heart failure or chronic kidney disease.;Long Term: Maintenance of blood pressure at goal levels.    Lipids Yes    Intervention Provide education and support for participant on nutrition & aerobic/resistive exercise along with prescribed medications to achieve LDL 70mg , HDL >40mg .    Expected Outcomes Short Term: Participant states understanding of desired cholesterol values and is compliant with medications prescribed. Participant is following exercise prescription and nutrition guidelines.;Long Term: Cholesterol controlled with medications as prescribed, with individualized exercise RX and with personalized nutrition plan. Value goals: LDL < 70mg , HDL > 40 mg.    Stress Yes    Intervention Offer individual and/or small group education and counseling on adjustment to heart disease, stress management and health-related lifestyle change. Teach and support self-help strategies.;Refer participants experiencing significant psychosocial distress to appropriate mental health specialists for further evaluation and treatment. When possible, include family members and significant others in education/counseling sessions.    Expected Outcomes Short Term: Participant demonstrates changes in health-related behavior, relaxation and other stress  management skills, ability to obtain effective social support, and compliance with psychotropic medications if prescribed.;Long Term: Emotional wellbeing is indicated by absence of clinically significant psychosocial distress or social isolation.             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 Introduction to [facility] education program/ intensive cardiac rehab. Initial orientation packet reviewed with patient.                Comments: Participant attended orientation for the cardiac rehabilitation program on  06/18/2022  to perform initial intake and exercise walk test. Patient introduced to the [facility] education and orientation packet was reviewed. Completed 6-minute walk test, measurements, initial ITP, and exercise prescription. Vital signs stable. Telemetry-normal sinus rhythm, asymptomatic.   Service time was from 1001 to 1135.  Colbert Ewing, MS 06/18/2022 12:09 PM

## 2022-06-18 NOTE — Progress Notes (Signed)
Cardiac Rehab Medication Review   Does the patient  feel that his/her medications are working for him/her?  YES   Has the patient been experiencing any side effects to the medications prescribed?  NO  Does the patient measure his/her own blood pressure or blood glucose at home?  YES   Does the patient have any problems obtaining medications due to transportation or finances?  NO  Understanding of regimen: good Understanding of indications: good Potential of compliance: good    Comments: Lindsey [name] has a good understanding of her medications and regime. She has BP cuff at home.     Lindsey Ewing, MS 06/18/2022 10:24 AM

## 2022-06-23 ENCOUNTER — Encounter (HOSPITAL_COMMUNITY)
Admission: RE | Admit: 2022-06-23 | Discharge: 2022-06-23 | Disposition: A | Discharge status: Home / Self Care | Payer: 59 | Source: Ambulatory Visit | Attending: Cardiology | Admitting: Cardiology

## 2022-06-23 DIAGNOSIS — I2089 Other forms of angina pectoris: Secondary | ICD-10-CM

## 2022-06-23 NOTE — Progress Notes (Signed)
Daily Session Note  Patient Details  Name: Lindsey [name] MRN: [id number] Date of Birth: [birth year] Referring Provider:   Flowsheet Row INTENSIVE CARDIAC REHAB ORIENT from 06/18/2022 in [facility]  Referring Provider Thomasene Ripple, DO       Encounter Date: 06/23/2022  Check In:  Session Check In - 06/23/22 1511       Check-In   Supervising physician immediately available to respond to emergencies [facility] MD immediately available    Physician(s) Gwendolyn Lima, NP    Location [facility]    Staff Present Lorin Picket, MS, ACSM-CEP, CCRP, Exercise Physiologist;Olinty Peggye Pitt, MS, ACSM-CEP, Exercise Physiologist;Jetta Dan Humphreys BS, ACSM-CEP, Exercise Physiologist;Johnny Hale Bogus, MS, Exercise Physiologist;Ghassan Coggeshall, RN, BSN    Virtual Visit No    Medication changes reported     No    Fall or balance concerns reported    No    Tobacco Cessation No Change    Warm-up and Cool-down Performed as group-led instruction    Resistance Training Performed Yes    VAD Patient? No    PAD/SET Patient? No      Pain Assessment   Currently in Pain? No/denies    Pain Score 0-No pain    Multiple Pain Sites No             Capillary Blood Glucose: No results found for this or any previous visit (from the past 24 hour(s)).   Exercise Prescription Changes - 06/23/22 1641       Response to Exercise   Blood Pressure (Admit) 100/70    Blood Pressure (Exercise) 122/70    Blood Pressure (Exit) 108/72    Heart Rate (Admit) 84 bpm    Heart Rate (Exercise) 114 bpm    Heart Rate (Exit) 89 bpm    Rating of Perceived Exertion (Exercise) 12.5    Perceived Dyspnea (Exercise) 0    Symptoms 0    Comments Pt first day in te[facility]    Duration Progress to 30 minutes of  aerobic without signs/symptoms of physical distress    Intensity THRR unchanged      Progression   Progression Continue to progress workloads to  maintain intensity without signs/symptoms of physical distress.    Average METs 2.46      Resistance Training   Training Prescription Yes    Weight 2    Reps 10-15    Time 10 Minutes      Treadmill   MPH 2.5    Grade 0    Minutes 15    METs 2.91      Recumbant Elliptical   Level 2    RPM 38    Watts 45    Minutes 15    METs 2             Social History   Tobacco Use  Smoking Status Former   Packs/day: 0.20   Years: 1.00   Additional pack years: 0.00   Total pack years: 0.20   Types: Cigarettes   Quit date: 10/27/2018   Years since quitting: 3.6  Smokeless Tobacco Never  Tobacco Comments   2018  " i STILL SMOKE 3  CIGARETTES A DAY "    Goals Met:  Exercise tolerated well No report of concerns or symptoms today Strength training completed today  Goals Unmet:  Not Applicable  Comments: Pt started cardiac rehab today.  Pt tolerated light exercise without difficulty. VSS, telemetry-Sinus Rhythm,  asymptomatic.  Medication list reconciled. Pt denies barriers to medicaiton compliance.  PSYCHOSOCIAL ASSESSMENT:  PHQ-11. Will review PhQ2-9 in the upcoming week   Pt enjoys crafting, travelling to [place], and spending time with her daughters.   Pt oriented to exercise equipment and routine.    Understanding verbalized.Thayer Headings RN BSN    Dr. Armanda Magic is Medical Director for Cardiac Rehab at [facility].

## 2022-06-25 ENCOUNTER — Encounter (HOSPITAL_COMMUNITY)
Admission: RE | Admit: 2022-06-25 | Discharge: 2022-06-25 | Disposition: A | Discharge status: Home / Self Care | Payer: 59 | Source: Ambulatory Visit | Attending: Cardiology | Admitting: Cardiology

## 2022-06-25 DIAGNOSIS — I2089 Other forms of angina pectoris: Secondary | ICD-10-CM

## 2022-06-27 ENCOUNTER — Encounter (HOSPITAL_COMMUNITY)
Admission: RE | Admit: 2022-06-27 | Discharge: 2022-06-27 | Disposition: A | Discharge status: Home / Self Care | Payer: 59 | Source: Ambulatory Visit | Attending: Cardiology | Admitting: Cardiology

## 2022-06-27 ENCOUNTER — Telehealth: Payer: Self-pay | Admitting: Cardiology

## 2022-06-27 DIAGNOSIS — I2089 Other forms of angina pectoris: Secondary | ICD-10-CM | POA: Diagnosis not present

## 2022-06-27 NOTE — Progress Notes (Signed)
QUALITY OF LIFE SCORE REVIEW  Pt completed Quality of Life survey as a participant in Cardiac Rehab.  Scores 21.0 or below are considered low.  Pt score very low in several areas Overall 21.83, Health and Function 20.03, socioeconomic 22.81, physiological and spiritual 24.86, family 21.40. Patient quality of life slightly altered by physical constraints which limits ability to perform as prior to recent cardiac illness. Anisten says she is depressed she takes something to help her sleep at night but wakes up every morning at 4 AM. Alden Benjamin says she is having some challenges with her teenage daughter. Offered emotional support and reassurance.  Will continue to monitor and intervene as necessary. Laresse's primary care provider called Dr Sharmon Revere at [facility]  called and notified she  is interested in counseling. Deleah says she has taken an antidepressant previously but did  not like the way it makes her feel.Thayer Headings RN BSN

## 2022-06-27 NOTE — Telephone Encounter (Signed)
Byrd Hesselbach nurse with Cardiac Rehab called regarding patient.  States "little bit of swelling" to left foot in the evening .  No other areas, no SOB, no other symptoms.  States her weight is actually down from April 3rd 196.8 to today at 192.4. Advised no changes at this time, patient should elevate legs in the evenings. Will call if any changes.

## 2022-06-27 NOTE — Progress Notes (Signed)
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 [place] at increased risk for metabolic syndrome, type 2 diabetes, heart disease, and even death. It is recommended to combine diet and exercise (cardiovascular and resistance training) to improve your body composition. Seek guidance from your physician and exercise physiologist before implementing an exercise routine.  Exercise Action Plan Clinical staff conducted group or individual video education with verbal and written material and guidebook.  Patient learns the recommended strategies to achieve and enjoy long-term exercise adherence, including variety,  self-motivation, self-efficacy, and positive decision making. Benefits of exercise include fitness, good health, weight management, more energy, better sleep, less stress, and overall well-being.  Medical   Heart Disease Risk Reduction Clinical staff conducted group or individual video education with verbal and written material and guidebook.  Patient learns our heart is our most vital organ as it circulates oxygen, nutrients, Dais blood cells, and hormones throughout the entire body, and carries waste away. Data supports a plant-based eating plan like the Pritikin Program for its effectiveness in slowing progression of and reversing heart disease. The video provides a number of recommendations to address heart disease.   Metabolic Syndrome and Belly Fat  Clinical staff conducted group or individual video education with verbal and written material and guidebook.  Patient learns what metabolic syndrome is, how it leads to heart disease, and how one can reverse it and keep it from coming back. You have metabolic syndrome if you have 3 of the following 5 criteria: abdominal obesity, high blood pressure, high triglycerides, low HDL cholesterol, and high blood sugar.  Hypertension and Heart Disease Clinical staff conducted group or individual video education with verbal and written material and guidebook.  Patient learns that high blood pressure, or hypertension, is very common in the [place]. Hypertension is largely due to excessive salt intake, but other important risk factors include being overweight, physical inactivity, drinking too much alcohol, smoking, and not eating enough potassium from fruits and vegetables. High blood pressure is a leading risk factor for heart attack, stroke, congestive heart failure, dementia, kidney failure, and premature death. Long-term effects of excessive salt intake include stiffening of the arteries and thickening of heart muscle and organ damage. Recommendations  include ways to reduce hypertension and the risk of heart disease.  Diseases of Our Time - Focusing on Diabetes Clinical staff conducted group or individual video education with verbal and written material and guidebook.  Patient learns why the best way to stop diseases of our time is prevention, through food and other lifestyle changes. Medicine (such as prescription pills and surgeries) is often only a Band-Aid on the problem, not a long-term solution. Most common diseases of our time include obesity, type 2 diabetes, hypertension, heart disease, and cancer. The Pritikin Program is recommended and has been proven to help reduce, reverse, and/or prevent the damaging effects of metabolic syndrome.  Nutrition   Overview of the Pritikin Eating Plan  Clinical staff conducted group or individual video education with verbal and written material and guidebook.  Patient learns about the Pritikin Eating Plan for disease risk reduction. The Pritikin Eating Plan emphasizes a wide variety of unrefined, minimally-processed carbohydrates, like fruits, vegetables, whole grains, and legumes. Go, Caution,  and Stop food choices are explained. Plant-based and lean animal proteins are emphasized. Rationale provided for low sodium intake for blood pressure control, low added sugars for blood sugar stabilization, and low added fats and oils for coronary artery disease risk reduction and weight management.  Calorie Density  Clinical staff conducted group or individual video education with verbal and written material and guidebook.  Patient learns about calorie density and how it impacts the Pritikin Eating Plan. Knowing the characteristics of the food you choose will help you decide whether those foods will lead to weight gain or weight loss, and whether you want to consume more or less of them. Weight loss is usually a side effect of the Pritikin Eating Plan because of its focus on low calorie-dense foods.  Label Reading   Clinical staff conducted group or individual video education with verbal and written material and guidebook.  Patient learns about the Pritikin recommended label reading guidelines and corresponding recommendations regarding calorie density, added sugars, sodium content, and whole grains.  Dining Out - Part 1  Clinical staff conducted group or individual video education with verbal and written material and guidebook.  Patient learns that restaurant meals can be sabotaging because they can be so high in calories, fat, sodium, and/or sugar. Patient learns recommended strategies on how to positively address this and avoid unhealthy pitfalls.  Facts on Fats  Clinical staff conducted group or individual video education with verbal and written material and guidebook.  Patient learns that lifestyle modifications can be just as effective, if not more so, as many medications for lowering your risk of heart disease. A Pritikin lifestyle can help to reduce your risk of inflammation and atherosclerosis (cholesterol build-up, or plaque, in the artery walls). Lifestyle interventions such as dietary choices and physical activity address the cause of atherosclerosis. A review of the types of fats and their impact on blood cholesterol levels, along with dietary recommendations to reduce fat intake is also included.  Nutrition Action Plan  Clinical staff conducted group or individual video education with verbal and written material and guidebook.  Patient learns how to incorporate Pritikin recommendations into their lifestyle. Recommendations include planning and keeping personal health goals in mind as an important part of their success.  Healthy Mind-Set    Healthy Minds, Bodies, Hearts  Clinical staff conducted group or individual video education with verbal and written material and guidebook.  Patient learns how to identify when they are stressed. Video will discuss the impact of that stress, as well as the  many benefits of stress management. Patient will also be introduced to stress management techniques. The way we think, act, and feel has an impact on our hearts.  How Our Thoughts Can Heal Our Hearts  Clinical staff conducted group or individual video education with verbal and written material and guidebook.  Patient learns that negative thoughts can cause depression and anxiety. This can result in negative lifestyle behavior and serious health problems. Cognitive behavioral therapy is an effective method to help control our thoughts in order to change and improve our emotional outlook.  Additional Videos:  Exercise    Improving Performance  Clinical staff conducted group or individual video education with verbal and written material and guidebook.  Patient learns to use a non-linear approach by alternating intensity levels and lengths of time spent exercising to help burn more calories and lose more body fat. Cardiovascular exercise helps improve heart health, metabolism, hormonal balance, blood sugar control, and recovery from fatigue. Resistance training  improves strength, endurance, balance, coordination, reaction time, metabolism, and muscle mass. Flexibility exercise improves circulation, posture, and balance. Seek guidance from your physician and exercise physiologist before implementing an exercise routine and learn your capabilities and proper form for all exercise.  Introduction to Yoga  Clinical staff conducted group or individual video education with verbal and written material and guidebook.  Patient learns about yoga, a discipline of the coming together of mind, breath, and body. The benefits of yoga include improved flexibility, improved range of motion, better posture and core strength, increased lung function, weight loss, and positive self-image. Yoga's heart health benefits include lowered blood pressure, healthier heart rate, decreased cholesterol and triglyceride levels, improved  immune function, and reduced stress. Seek guidance from your physician and exercise physiologist before implementing an exercise routine and learn your capabilities and proper form for all exercise.  Medical   Aging: Enhancing Your Quality of Life  Clinical staff conducted group or individual video education with verbal and written material and guidebook.  Patient learns key strategies and recommendations to stay in good physical health and enhance quality of life, such as prevention strategies, having an advocate, securing a Health Care Proxy and Power of Attorney, and keeping a list of medications and system for tracking them. It also discusses how to avoid risk for bone loss.  Biology of Weight Control  Clinical staff conducted group or individual video education with verbal and written material and guidebook.  Patient learns that weight gain occurs because we consume more calories than we burn (eating more, moving less). Even if your body weight is normal, you may have higher ratios of fat compared to muscle mass. Too much body fat puts you at increased risk for cardiovascular disease, heart attack, stroke, type 2 diabetes, and obesity-related cancers. In addition to exercise, following the Pritikin Eating Plan can help reduce your risk.  Decoding Lab Results  Clinical staff conducted group or individual video education with verbal and written material and guidebook.  Patient learns that lab test reflects one measurement whose values change over time and are influenced by many factors, including medication, stress, sleep, exercise, food, hydration, pre-existing medical conditions, and more. It is recommended to use the knowledge from this video to become more involved with your lab results and evaluate your numbers to speak with your doctor.   Diseases of Our Time - Overview  Clinical staff conducted group or individual video education with verbal and written material and guidebook.  Patient  learns that according to the CDC, 50% to 70% of chronic diseases (such as obesity, type 2 diabetes, elevated lipids, hypertension, and heart disease) are avoidable through lifestyle improvements including healthier food choices, listening to satiety cues, and increased physical activity.  Sleep Disorders Clinical staff conducted group or individual video education with verbal and written material and guidebook.  Patient learns how good quality and duration of sleep are important to overall health and well-being. Patient also learns about sleep disorders and how they impact health along with recommendations to address them, including discussing with a physician.  Nutrition  Dining Out - Part 2 Clinical staff conducted group or individual video education with verbal and written material and guidebook.  Patient learns how to plan ahead and communicate in order to maximize their dining experience in a healthy and nutritious manner. Included are recommended food choices based on the type of restaurant the patient is visiting.   Fueling a Banker conducted group or individual video education  with verbal and written material and guidebook.  There is a strong connection between our food choices and our health. Diseases like obesity and type 2 diabetes are very prevalent and are in large-part due to lifestyle choices. The Pritikin Eating Plan provides plenty of food and hunger-curbing satisfaction. It is easy to follow, affordable, and helps reduce health risks.  Menu Workshop  Clinical staff conducted group or individual video education with verbal and written material and guidebook.  Patient learns that restaurant meals can sabotage health goals because they are often packed with calories, fat, sodium, and sugar. Recommendations include strategies to plan ahead and to communicate with the manager, chef, or server to help order a healthier meal.  Planning Your Eating Strategy   Clinical staff conducted group or individual video education with verbal and written material and guidebook.  Patient learns about the Pritikin Eating Plan and its benefit of reducing the risk of disease. The Pritikin Eating Plan does not focus on calories. Instead, it emphasizes high-quality, nutrient-rich foods. By knowing the characteristics of the foods, we choose, we can determine their calorie density and make informed decisions.  Targeting Your Nutrition Priorities  Clinical staff conducted group or individual video education with verbal and written material and guidebook.  Patient learns that lifestyle habits have a tremendous impact on disease risk and progression. This video provides eating and physical activity recommendations based on your personal health goals, such as reducing LDL cholesterol, losing weight, preventing or controlling type 2 diabetes, and reducing high blood pressure.  Vitamins and Minerals  Clinical staff conducted group or individual video education with verbal and written material and guidebook.  Patient learns different ways to obtain key vitamins and minerals, including through a recommended healthy diet. It is important to discuss all supplements you take with your doctor.   Healthy Mind-Set    Smoking Cessation  Clinical staff conducted group or individual video education with verbal and written material and guidebook.  Patient learns that cigarette smoking and tobacco addiction pose a serious health risk which affects millions of people. Stopping smoking will significantly reduce the risk of heart disease, lung disease, and many forms of cancer. Recommended strategies for quitting are covered, including working with your doctor to develop a successful plan.  Culinary   Becoming a Set designer conducted group or individual video education with verbal and written material and guidebook.  Patient learns that cooking at home can be healthy,  cost-effective, quick, and puts them in control. Keys to cooking healthy recipes will include looking at your recipe, assessing your equipment needs, planning ahead, making it simple, choosing cost-effective seasonal ingredients, and limiting the use of added fats, salts, and sugars.  Cooking - Breakfast and Snacks  Clinical staff conducted group or individual video education with verbal and written material and guidebook.  Patient learns how important breakfast is to satiety and nutrition through the entire day. Recommendations include key foods to eat during breakfast to help stabilize blood sugar levels and to prevent overeating at meals later in the day. Planning ahead is also a key component.  Cooking - Educational psychologist conducted group or individual video education with verbal and written material and guidebook.  Patient learns eating strategies to improve overall health, including an approach to cook more at home. Recommendations include thinking of animal protein as a side on your plate rather than center stage and focusing instead on lower calorie dense options like vegetables, fruits, whole grains,  and plant-based proteins, such as beans. Making sauces in large quantities to freeze for later and leaving the skin on your vegetables are also recommended to maximize your experience.  Cooking - Healthy Salads and Dressing Clinical staff conducted group or individual video education with verbal and written material and guidebook.  Patient learns that vegetables, fruits, whole grains, and legumes are the foundations of the Pritikin Eating Plan. Recommendations include how to incorporate each of these in flavorful and healthy salads, and how to create homemade salad dressings. Proper handling of ingredients is also covered. Cooking - Soups and [employer] - Soups and Desserts Clinical staff conducted group or individual video education with verbal and written material and  guidebook.  Patient learns that Pritikin soups and desserts make for easy, nutritious, and delicious snacks and meal components that are low in sodium, fat, sugar, and calorie density, while high in vitamins, minerals, and filling fiber. Recommendations include simple and healthy ideas for soups and desserts.   Overview     The Pritikin Solution Program Overview Clinical staff conducted group or individual video education with verbal and written material and guidebook.  Patient learns that the results of the Pritikin Program have been documented in more than 100 articles published in peer-reviewed journals, and the benefits include reducing risk factors for (and, in some cases, even reversing) high cholesterol, high blood pressure, type 2 diabetes, obesity, and more! An overview of the three key pillars of the Pritikin Program will be covered: eating well, doing regular exercise, and having a healthy mind-set.  WORKSHOPS  Exercise: Exercise Basics: Building Your Action Plan Clinical staff led group instruction and group discussion with PowerPoint presentation and patient guidebook. To enhance the learning environment the use of posters, models and videos may be added. At the conclusion of this workshop, patients will comprehend the difference between physical activity and exercise, as well as the benefits of incorporating both, into their routine. Patients will understand the FITT (Frequency, Intensity, Time, and Type) principle and how to use it to build an exercise action plan. In addition, safety concerns and other considerations for exercise and cardiac rehab will be addressed by the presenter. The purpose of this lesson is to promote a comprehensive and effective weekly exercise routine in order to improve patients' overall level of fitness.   Managing Heart Disease: Your Path to a Healthier Heart Clinical staff led group instruction and group discussion with PowerPoint presentation and  patient guidebook. To enhance the learning environment the use of posters, models and videos may be added.At the conclusion of this workshop, patients will understand the anatomy and physiology of the heart. Additionally, they will understand how Pritikin's three pillars impact the risk factors, the progression, and the management of heart disease.  The purpose of this lesson is to provide a high-level overview of the heart, heart disease, and how the Pritikin lifestyle positively impacts risk factors.  Exercise Biomechanics Clinical staff led group instruction and group discussion with PowerPoint presentation and patient guidebook. To enhance the learning environment the use of posters, models and videos may be added. Patients will learn how the structural parts of their bodies function and how these functions impact their daily activities, movement, and exercise. Patients will learn how to promote a neutral spine, learn how to manage pain, and identify ways to improve their physical movement in order to promote healthy living. The purpose of this lesson is to expose patients to common physical limitations that impact physical activity. Participants will  learn practical ways to adapt and manage aches and pains, and to minimize their effect on regular exercise. Patients will learn how to maintain good posture while sitting, walking, and lifting.  Balance Training and Fall Prevention  Clinical staff led group instruction and group discussion with PowerPoint presentation and patient guidebook. To enhance the learning environment the use of posters, models and videos may be added. At the conclusion of this workshop, patients will understand the importance of their sensorimotor skills (vision, proprioception, and the vestibular system) in maintaining their ability to balance as they age. Patients will apply a variety of balancing exercises that are appropriate for their current level of function.  Patients will understand the common causes for poor balance, possible solutions to these problems, and ways to modify their physical environment in order to minimize their fall risk. The purpose of this lesson is to teach patients about the importance of maintaining balance as they age and ways to minimize their risk of falling.  WORKSHOPS   Nutrition:  Fueling a Ship broker led group instruction and group discussion with PowerPoint presentation and patient guidebook. To enhance the learning environment the use of posters, models and videos may be added. Patients will review the foundational principles of the Pritikin Eating Plan and understand what constitutes a serving size in each of the food groups. Patients will also learn Pritikin-friendly foods that are better choices when away from home and review make-ahead meal and snack options. Calorie density will be reviewed and applied to three nutrition priorities: weight maintenance, weight loss, and weight gain. The purpose of this lesson is to reinforce (in a group setting) the key concepts around what patients are recommended to eat and how to apply these guidelines when away from home by planning and selecting Pritikin-friendly options. Patients will understand how calorie density may be adjusted for different weight management goals.  Mindful Eating  Clinical staff led group instruction and group discussion with PowerPoint presentation and patient guidebook. To enhance the learning environment the use of posters, models and videos may be added. Patients will briefly review the concepts of the Pritikin Eating Plan and the importance of low-calorie dense foods. The concept of mindful eating will be introduced as well as the importance of paying attention to internal hunger signals. Triggers for non-hunger eating and techniques for dealing with triggers will be explored. The purpose of this lesson is to provide patients with the  opportunity to review the basic principles of the Pritikin Eating Plan, discuss the value of eating mindfully and how to measure internal cues of hunger and fullness using the Hunger Scale. Patients will also discuss reasons for non-hunger eating and learn strategies to use for controlling emotional eating.  Targeting Your Nutrition Priorities Clinical staff led group instruction and group discussion with PowerPoint presentation and patient guidebook. To enhance the learning environment the use of posters, models and videos may be added. Patients will learn how to determine their genetic susceptibility to disease by reviewing their family history. Patients will gain insight into the importance of diet as part of an overall healthy lifestyle in mitigating the impact of genetics and other environmental insults. The purpose of this lesson is to provide patients with the opportunity to assess their personal nutrition priorities by looking at their family history, their own health history and current risk factors. Patients will also be able to discuss ways of prioritizing and modifying the Pritikin Eating Plan for their highest risk areas  Menu  Clinical  staff led group instruction and group discussion with PowerPoint presentation and patient guidebook. To enhance the learning environment the use of posters, models and videos may be added. Using menus brought in from [employer], or printed from [employer], patients will apply the Pritikin dining out guidelines that were presented in the [employer] video. Patients will also be able to practice these guidelines in a variety of provided scenarios. The purpose of this lesson is to provide patients with the opportunity to practice hands-on learning of the Pritikin Dining Out guidelines with actual menus and practice scenarios.  Label Reading Clinical staff led group instruction and group discussion with PowerPoint presentation and  patient guidebook. To enhance the learning environment the use of posters, models and videos may be added. Patients will review and discuss the Pritikin label reading guidelines presented in Pritikin's Label Reading Educational series video. Using fool labels brought in from local grocery stores and markets, patients will apply the label reading guidelines and determine if the packaged food meet the Pritikin guidelines. The purpose of this lesson is to provide patients with the opportunity to review, discuss, and practice hands-on learning of the Pritikin Label Reading guidelines with actual packaged food labels. Cooking School  [employer]ik[employer]'s [employer] are designed to teach patients ways to prepare quick, simple, and affordable recipes at home. The importance of nutrition's role in chronic disease risk reduction is reflected in its emphasis in the overall Pritikin program. By learning how to prepare essential core Pritikin Eating Plan recipes, patients will increase control over what they eat; be able to customize the flavor of foods without the use of added salt, sugar, or fat; and improve the quality of the food they consume. By learning a set of core recipes which are easily assembled, quickly prepared, and affordable, patients are more likely to prepare more healthy foods at home. These workshops focus on convenient breakfasts, simple entres, side dishes, and desserts which can be prepared with minimal effort and are consistent with nutrition recommendations for cardiovascular risk reduction. Cooking [employer] are taught by a Armed forces logistics/support/administrative officer (RD) who has been trained by the [employer]. The chef or RD has a clear understanding of the importance of minimizing - if not completely eliminating - added fat, sugar, and sodium in recipes. Throughout the series of Cooking School Workshop sessions, patients will learn about healthy ingredients and efficient methods of  cooking to build confidence in their capability to prepare    Cooking School weekly topics:  Adding Flavor- Sodium-Free  Fast and Healthy Breakfasts  Powerhouse Plant-Based Proteins  Satisfying Salads and Dressings  Simple Sides and Sauces  International Cuisine-Spotlight on the [employer] Zones  Delicious Desserts  Savory Soups  [employer] - Meals in a Astronomer Appetizers and Snacks  Comforting Weekend Breakfasts  One-Pot Wonders   Fast Evening Meals  Landscape architect Your Pritikin Plate  WORKSHOPS   Healthy Mindset (Psychosocial):  Focused Goals, Sustainable Changes Clinical staff led group instruction and group discussion with PowerPoint presentation and patient guidebook. To enhance the learning environment the use of posters, models and videos may be added. Patients will be able to apply effective goal setting strategies to establish at least one personal goal, and then take consistent, meaningful action toward that goal. They will learn to identify common barriers to achieving personal goals and develop strategies to overcome them. Patients will also gain an understanding of how our mind-set can impact  our ability to achieve goals and the importance of cultivating a positive and growth-oriented mind-set. The purpose of this lesson is to provide patients with a deeper understanding of how to set and achieve personal goals, as well as the tools and strategies needed to overcome common obstacles which may arise along the way.  From Head to Heart: The Power of a Healthy Outlook  Clinical staff led group instruction and group discussion with PowerPoint presentation and patient guidebook. To enhance the learning environment the use of posters, models and videos may be added. Patients will be able to recognize and describe the impact of emotions and mood on physical health. They will discover the importance of self-care and explore self-care practices which may work  for them. Patients will also learn how to utilize the 4 C's to cultivate a healthier outlook and better manage stress and challenges. The purpose of this lesson is to demonstrate to patients how a healthy outlook is an essential part of maintaining good health, especially as they continue their cardiac rehab journey.  Healthy Sleep for a Healthy Heart Clinical staff led group instruction and group discussion with PowerPoint presentation and patient guidebook. To enhance the learning environment the use of posters, models and videos may be added. At the conclusion of this workshop, patients will be able to demonstrate knowledge of the importance of sleep to overall health, well-being, and quality of life. They will understand the symptoms of, and treatments for, common sleep disorders. Patients will also be able to identify daytime and nighttime behaviors which impact sleep, and they will be able to apply these tools to help manage sleep-related challenges. The purpose of this lesson is to provide patients with a general overview of sleep and outline the importance of quality sleep. Patients will learn about a few of the most common sleep disorders. Patients will also be introduced to the concept of "sleep hygiene," and discover ways to self-manage certain sleeping problems through simple daily behavior changes. Finally, the workshop will motivate patients by clarifying the links between quality sleep and their goals of heart-healthy living.   Recognizing and Reducing Stress Clinical staff led group instruction and group discussion with PowerPoint presentation and patient guidebook. To enhance the learning environment the use of posters, models and videos may be added. At the conclusion of this workshop, patients will be able to understand the types of stress reactions, differentiate between acute and chronic stress, and recognize the impact that chronic stress has on their health. They will also be able to  apply different coping mechanisms, such as reframing negative self-talk. Patients will have the opportunity to practice a variety of stress management techniques, such as deep abdominal breathing, progressive muscle relaxation, and/or guided imagery.  The purpose of this lesson is to educate patients on the role of stress in their lives and to provide healthy techniques for coping with it.  Learning Barriers/Preferences:  Learning Barriers/Preferences - 06/18/22 1205       Learning Barriers/Preferences   Learning Barriers Sight   wears glasses   Learning Preferences Audio;Computer/Internet;Group Instruction;Individual Instruction;Skilled Demonstration;Verbal Instruction;Video;Written Material;Pictoral             Education Topics:  Knowledge Questionnaire Score:  Knowledge Questionnaire Score - 06/18/22 1205       Knowledge Questionnaire Score   Pre Score 20/24             Core Components/Risk Factors/Patient Goals at Admission:  Personal Goals and Risk Factors at Admission - 06/18/22 1206  Core Components/Risk Factors/Patient Goals on Admission    Weight Management Yes;Obesity;Weight Loss    Intervention Obesity: Provide education and appropriate resources to help participant work on and attain dietary goals.;Weight Management/Obesity: Establish reasonable short term and long term weight goals.;Weight Management: Provide education and appropriate resources to help participant work on and attain dietary goals.;Weight Management: Develop a combined nutrition and exercise program designed to reach desired caloric intake, while maintaining appropriate intake of nutrient and fiber, sodium and fats, and appropriate energy expenditure required for the weight goal.    Admit Weight 196 lb 13.9 oz (89.3 kg)    Goal Weight: Long Term 150 lb (68 kg)    Expected Outcomes Short Term: Continue to assess and modify interventions until short term weight is achieved;Long Term: Adherence  to nutrition and physical activity/exercise program aimed toward attainment of established weight goal;Weight Loss: Understanding of general recommendations for a balanced deficit meal plan, which promotes 1-2 lb weight loss per week and includes a negative energy balance of [phone] kcal/d;Understanding recommendations for meals to include 15-35% energy as protein, 25-35% energy from fat, 35-60% energy from carbohydrates, less than 200mg  of dietary cholesterol, 20-35 gm of total fiber daily;Understanding of distribution of calorie intake throughout the day with the consumption of 4-5 meals/snacks    Hypertension Yes    Intervention Provide education on lifestyle modifcations including regular physical activity/exercise, weight management, moderate sodium restriction and increased consumption of fresh fruit, vegetables, and low fat dairy, alcohol moderation, and smoking cessation.;Monitor prescription use compliance.    Expected Outcomes Short Term: Continued assessment and intervention until BP is < 140/80mm HG in hypertensive participants. < 130/68mm HG in hypertensive participants with diabetes, heart failure or chronic kidney disease.;Long Term: Maintenance of blood pressure at goal levels.    Lipids Yes    Intervention Provide education and support for participant on nutrition & aerobic/resistive exercise along with prescribed medications to achieve LDL 70mg , HDL >40mg .    Expected Outcomes Short Term: Participant states understanding of desired cholesterol values and is compliant with medications prescribed. Participant is following exercise prescription and nutrition guidelines.;Long Term: Cholesterol controlled with medications as prescribed, with individualized exercise RX and with personalized nutrition plan. Value goals: LDL < 70mg , HDL > 40 mg.    Stress Yes    Intervention Offer individual and/or small group education and counseling on adjustment to heart disease, stress management and  health-related lifestyle change. Teach and support self-help strategies.;Refer participants experiencing significant psychosocial distress to appropriate mental health specialists for further evaluation and treatment. When possible, include family members and significant others in education/counseling sessions.    Expected Outcomes Short Term: Participant demonstrates changes in health-related behavior, relaxation and other stress management skills, ability to obtain effective social support, and compliance with psychotropic medications if prescribed.;Long Term: Emotional wellbeing is indicated by absence of clinically significant psychosocial distress or social isolation.             Core Components/Risk Factors/Patient Goals Review:   Goals and Risk Factor Review     Row Name 06/23/22 1701 06/27/22 1714           Core Components/Risk Factors/Patient Goals Review   Personal Goals Review Weight Management/Obesity;Hypertension;Lipids;Stress Weight Management/Obesity;Hypertension;Lipids;Stress      Review Lindsey [name] started intensive caridac rehab on 06/23/22 and did well with exercise. Vital signs were stable Lindsey [name] started intensive caridac rehab on 06/23/22 and is off to a good start to exercise. Vital signs were stable. Reports left ankle swelling Dr Mallory Shirk office  called and notified      Expected Outcomes Lindsey [name] will continue to participate in intensive cardiac rehab for exercise, nutrition and lifestyle modifications Lindsey [name] will continue to participate in intensive cardiac rehab for exercise, nutrition and lifestyle modifications               Core Components/Risk Factors/Patient Goals at Discharge (Final Review):   Goals and Risk Factor Review - 06/27/22 1714       Core Components/Risk Factors/Patient Goals Review   Personal Goals Review Weight Management/Obesity;Hypertension;Lipids;Stress    Review Lindsey [name] started intensive caridac rehab on 06/23/22 and is off to a good start  to exercise. Vital signs were stable. Reports left ankle swelling Dr Mallory Shirk office called and notified    Expected Outcomes Lindsey [name] will continue to participate in intensive cardiac rehab for exercise, nutrition and lifestyle modifications             ITP Comments:  ITP Comments     Row Name 06/18/22 1023 06/23/22 1649         ITP Comments Dr. Armanda Magic medical director. Introduction to [facility] education program/ intensive cardiac rehab. Initial orientation packet reviewed with patient. 30 Day ITP Reivew. Skyelyn started intesive cardiac rehab on 06/23/22 and did well with exercise               Comments: See ITP comments.Lindsey Headings RN BSN

## 2022-06-27 NOTE — Telephone Encounter (Signed)
Pt c/o swelling: STAT is pt has developed SOB within 24 hours  How much weight have you gained and in what time span? No  If swelling, where is the swelling located? In her left lower legs  Are you currently taking a fluid pill? No  Are you currently SOB? No  Do you have a log of your daily weights (if so, list)?   Have you gained 3 pounds in a day or 5 pounds in a week?   Have you traveled recently?   Call transferred to triage. Byrd Hesselbach was requesting to someone regarding the swelling.

## 2022-06-27 NOTE — Progress Notes (Signed)
Patient reported noted increased swelling in her left lower leg. Lindsey [name] says she especially notes the swelling at night. Upon assessment traced left ankle edema noted. Weight today 87.3 kg. Weight at orientation 89.3 kg. Dr Mallory Shirk office called and notified about the patient's concerns. Spoke with Triage nurse. Denies shortness of breath.Will continue to monitor the patient throughout  the program.Lindsey [name] [name] RN BSN

## 2022-06-30 ENCOUNTER — Encounter (HOSPITAL_COMMUNITY)
Admission: RE | Admit: 2022-06-30 | Discharge: 2022-06-30 | Disposition: A | Discharge status: Home / Self Care | Payer: 59 | Source: Ambulatory Visit | Attending: Cardiology | Admitting: Cardiology

## 2022-06-30 DIAGNOSIS — I2089 Other forms of angina pectoris: Secondary | ICD-10-CM | POA: Diagnosis not present

## 2022-07-02 ENCOUNTER — Encounter (HOSPITAL_COMMUNITY)
Admission: RE | Admit: 2022-07-02 | Discharge: 2022-07-02 | Disposition: A | Discharge status: Home / Self Care | Payer: 59 | Source: Ambulatory Visit | Attending: Cardiology | Admitting: Cardiology

## 2022-07-02 DIAGNOSIS — I2089 Other forms of angina pectoris: Secondary | ICD-10-CM

## 2022-07-04 ENCOUNTER — Encounter (HOSPITAL_COMMUNITY)
Admission: RE | Admit: 2022-07-04 | Discharge: 2022-07-04 | Disposition: A | Discharge status: Home / Self Care | Payer: 59 | Source: Ambulatory Visit | Attending: Cardiology | Admitting: Cardiology

## 2022-07-04 DIAGNOSIS — I2089 Other forms of angina pectoris: Secondary | ICD-10-CM | POA: Diagnosis not present

## 2022-07-07 ENCOUNTER — Encounter (HOSPITAL_COMMUNITY)
Admission: RE | Admit: 2022-07-07 | Discharge: 2022-07-07 | Disposition: A | Discharge status: Home / Self Care | Payer: 59 | Source: Ambulatory Visit | Attending: Cardiology | Admitting: Cardiology

## 2022-07-07 DIAGNOSIS — I2089 Other forms of angina pectoris: Secondary | ICD-10-CM | POA: Diagnosis not present

## 2022-07-09 ENCOUNTER — Encounter (HOSPITAL_COMMUNITY)
Admission: RE | Admit: 2022-07-09 | Discharge: 2022-07-09 | Disposition: A | Discharge status: Home / Self Care | Payer: 59 | Source: Ambulatory Visit | Attending: Cardiology | Admitting: Cardiology

## 2022-07-09 DIAGNOSIS — I2089 Other forms of angina pectoris: Secondary | ICD-10-CM

## 2022-07-09 NOTE — Progress Notes (Signed)
Reviewed home exercise guidelines with patient including endpoints, temperature precautions, target heart rate and rate of perceived exertion. Patient is currently walking 30 minutes, 2 days/week as her mode of home exercise. Patient voices understanding of instructions given.  Artist Pais, MS, ACSM CEP

## 2022-07-11 ENCOUNTER — Ambulatory Visit: Payer: 59 | Admitting: Cardiology

## 2022-07-11 ENCOUNTER — Encounter (HOSPITAL_COMMUNITY)
Admission: RE | Admit: 2022-07-11 | Discharge: 2022-07-11 | Disposition: A | Discharge status: Home / Self Care | Payer: 59 | Source: Ambulatory Visit | Attending: Cardiology | Admitting: Cardiology

## 2022-07-11 ENCOUNTER — Ambulatory Visit: Payer: Medicare Other | Admitting: Cardiology

## 2022-07-11 DIAGNOSIS — I2089 Other forms of angina pectoris: Secondary | ICD-10-CM | POA: Diagnosis not present

## 2022-07-14 ENCOUNTER — Encounter (HOSPITAL_COMMUNITY)
Admission: RE | Admit: 2022-07-14 | Discharge: 2022-07-14 | Disposition: A | Discharge status: Home / Self Care | Payer: 59 | Source: Ambulatory Visit | Attending: Cardiology | Admitting: Cardiology

## 2022-07-14 DIAGNOSIS — I2089 Other forms of angina pectoris: Secondary | ICD-10-CM | POA: Diagnosis not present

## 2022-07-16 ENCOUNTER — Encounter (HOSPITAL_COMMUNITY)
Admission: RE | Admit: 2022-07-16 | Discharge: 2022-07-16 | Disposition: A | Discharge status: Home / Self Care | Payer: 59 | Source: Ambulatory Visit | Attending: Cardiology | Admitting: Cardiology

## 2022-07-16 DIAGNOSIS — I2089 Other forms of angina pectoris: Secondary | ICD-10-CM

## 2022-07-18 ENCOUNTER — Encounter (HOSPITAL_COMMUNITY): Payer: 59

## 2022-07-21 ENCOUNTER — Encounter (HOSPITAL_COMMUNITY)
Admission: RE | Admit: 2022-07-21 | Discharge: 2022-07-21 | Disposition: A | Discharge status: Home / Self Care | Payer: 59 | Source: Ambulatory Visit | Attending: Cardiology | Admitting: Cardiology

## 2022-07-21 DIAGNOSIS — I2089 Other forms of angina pectoris: Secondary | ICD-10-CM

## 2022-07-23 ENCOUNTER — Encounter (HOSPITAL_COMMUNITY)
Admission: RE | Admit: 2022-07-23 | Discharge: 2022-07-23 | Disposition: A | Discharge status: Home / Self Care | Payer: 59 | Source: Ambulatory Visit | Attending: Cardiology | Admitting: Cardiology

## 2022-07-23 DIAGNOSIS — I2089 Other forms of angina pectoris: Secondary | ICD-10-CM | POA: Diagnosis not present

## 2022-07-23 NOTE — Progress Notes (Signed)
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 of life.  PHQ-9: Review Flowsheet  More data exists      06/18/2022 11/27/2017 09/23/2017 08/31/2017 08/03/2017  Depression screen PHQ 2/9  Decreased Interest 2 3 1  0 1  Down, Depressed, Hopeless 2 3 1  0 1  PHQ - 2 Score 4 6 2  0 2  Altered sleeping 3 - 1 - 1  Tired, decreased energy 1 - 1 - 1  Change in appetite 1 - 1 - 0  Feeling bad or failure about yourself  0 - 1 - 0  Trouble concentrating 0 - 1 -  0  Moving slowly or fidgety/restless 0 - - - 0  Suicidal thoughts 0 - 0 - 0  PHQ-9 Score 9 - 7 - 4  Difficult doing work/chores Somewhat difficult - - - Not difficult at all   Interpretation of Total Score  Total Score Depression Severity:  1-4 = Minimal depression, 5-9 = Mild depression, 10-14 = Moderate depression,  15-19 = Moderately severe depression, 20-27 = Severe depression   Psychosocial Evaluation and Intervention:   Psychosocial Re-Evaluation:  Psychosocial Re-Evaluation     Row Name 06/23/22 1650 07/23/22 1735           Psychosocial Re-Evaluation   Current issues with Current Stress Concerns;History of Depression;Current Depression;Current Anxiety/Panic Current Stress Concerns;History of Depression;Current Depression;Current Anxiety/Panic      Comments Tamela Gammon started intensive cardiac rehab on 06/23/22 will review depressin screening and quality of life in the upcoming week Reviewed quality of life. Dr Gwendel Hanson office was contacted as the patient is interested in counselling. Dr Delia Chimes called the patient and will set up appointment for patient in the near future      Expected Outcomes Kyleigh will have decreased or controlled depression upon completion of intensive cardiac rehab Tamar will have decreased or controlled depression upon completion of intensive cardiac rehab      Interventions Stress management education;Relaxation education;Encouraged to attend Cardiac Rehabilitation for the exercise Stress management education;Relaxation education;Encouraged to attend Cardiac Rehabilitation for the exercise      Continue Psychosocial Services  Follow up required by staff Follow up required by staff        Initial Review   Source of Stress Concerns Chronic Illness Chronic Illness      Comments Will continue to montior and offer support as needed Will continue to montior and offer support as needed               Psychosocial Discharge (Final Psychosocial Re-Evaluation):  Psychosocial Re-Evaluation - 07/23/22 1735       Psychosocial Re-Evaluation   Current issues with Current Stress Concerns;History of Depression;Current Depression;Current Anxiety/Panic    Comments Reviewed quality of life. Dr Gwendel Hanson office was contacted as the patient is interested in counselling. Dr Delia Chimes  called the patient and will set up appointment for patient in the near future    Expected Outcomes Tenya will have decreased or controlled depression upon completion of intensive cardiac rehab    Interventions Stress management education;Relaxation education;Encouraged to attend Cardiac Rehabilitation for the exercise    Continue Psychosocial Services  Follow up required by staff      Initial Review   Source of Stress Concerns Chronic Illness    Comments Will continue to montior and offer support as needed             Vocational Rehabilitation: Provide vocational rehab assistance to qualifying candidates.   Vocational Rehab Evaluation & Intervention:  Vocational Rehab - 06/18/22 1205       Initial Vocational Rehab Evaluation & Intervention   Assessment shows need for Vocational Rehabilitation No   Viktoria is on disability and not interested in working            Education: Education Goals: Education classes will be provided on a weekly basis, covering required topics. Participant will state understanding/return demonstration of topics presented.    Education     Row Name 06/23/22 1500     Education   Cardiac Education Topics Pritikin   Nurse, children's  Dietitian   Select Nutrition   Nutrition Nutrition Action Plan   Instruction Review Code 1- Verbalizes Understanding   Class Start Time 1405   Class Stop Time 1440   Class Time Calculation (min) 35 min    Row Name 06/25/22 1600     Education   Cardiac Education Topics Pritikin   Customer service manager   Weekly Topic Efficiency Cooking - Meals in a Snap   Instruction Review Code 1- Verbalizes Understanding   Class Start Time 1400   Class Stop Time 1445   Class Time Calculation (min) 45 min    Row Name 06/27/22 1400     Education   Cardiac Education Topics Pritikin   Chief of Staff Exercise Education   Exercise Education Move It!   Instruction Review Code 1- Verbalizes Understanding   Class Start Time 1359   Class Stop Time 1433   Class Time Calculation (min) 34 min    Row Name 06/30/22 1600     Education   Cardiac Education Topics Pritikin   Geographical information systems officer Psychosocial   Psychosocial Workshop Focused Goals, Sustainable Changes   Instruction Review Code 1- Verbalizes Understanding   Class Start Time 1400   Class Stop Time 1441   Class Time Calculation (min) 41 min    Row Name 07/02/22 1500     Education   Cardiac Education Topics Pritikin   Customer service manager   Weekly Topic One-Pot Wonders   Instruction Review Code 1- Verbalizes Understanding   Class Start Time 1400   Class Stop Time 1440   Class Time Calculation (min) 40 min    Row Name 07/04/22 1500     Education   Cardiac Education Topics Pritikin   Psychologist, forensic General Education   General Education Hypertension and Heart Disease   Instruction Review Code 1- Verbalizes Understanding   Class Start Time 1410   Class Stop Time 1455   Class Time Calculation (min) 45 min    Row Name 07/07/22 1400     Education   Cardiac Education Topics Pritikin   Psychologist, forensic Exercise Education   Exercise Education Biomechanial Limitations   Instruction Review Code 1- Verbalizes Understanding   Class Start Time 1407   Class Stop Time 1444   Class Time Calculation (min) 37 min    Row Name 07/09/22 1600     Education   Cardiac Education Topics Pritikin   Customer service manager   Weekly Topic Comforting Weekend Breakfasts   Instruction Review Code 1- Verbalizes Understanding   Class Start Time 1355    Class Stop Time 1442   Class Time Calculation (min) 47 min    Row Name 07/11/22 1500     Education   Cardiac Education Topics Pritikin   Licensed conveyancer Nutrition   Nutrition Dining Out - Part 1   Instruction Review Code 1- Verbalizes Understanding   Class Start Time 1400  Class Stop Time 1445   Class Time Calculation (min) 45 min    Row Name 07/14/22 1500     Education   Cardiac Education Topics Pritikin   Select Core Videos     Core Videos   Educator Dietitian   Select Nutrition   Nutrition Facts on Fat   Instruction Review Code 1- Verbalizes Understanding   Class Start Time 1400   Class Stop Time 1439   Class Time Calculation (min) 39 min    Row Name 07/16/22 1500     Education   Cardiac Education Topics Pritikin   Customer service manager   Weekly Topic Fast Evening Meals   Instruction Review Code 1- Verbalizes Understanding   Class Start Time 1400   Class Stop Time 1440   Class Time Calculation (min) 40 min    Row Name 07/21/22 1700     Education   Cardiac Education Topics Pritikin   Geographical information systems officer Psychosocial   Psychosocial Workshop Healthy Sleep for a Healthy Heart   Instruction Review Code 1- Verbalizes Understanding   Class Start Time 1405   Class Stop Time 1502   Class Time Calculation (min) 57 min    Row Name 07/23/22 1500     Education   Cardiac Education Topics Pritikin   Customer service manager   Weekly Topic International Cuisine- Spotlight on the [employer] Zones   Instruction Review Code 1- Verbalizes Understanding   Class Start Time 1355   Class Stop Time 1430   Class Time Calculation (min) 35 min            Core Videos: Exercise    Move It!  Clinical staff conducted group or individual video education with verbal and written material and  guidebook.  Patient learns the recommended Pritikin exercise program. Exercise with the goal of living a long, healthy life. Some of the health benefits of exercise include controlled diabetes, healthier blood pressure levels, improved cholesterol levels, improved heart and lung capacity, improved sleep, and better body composition. Everyone should speak with their doctor before starting or changing an exercise routine.  Biomechanical Limitations Clinical staff conducted group or individual video education with verbal and written material and guidebook.  Patient learns how biomechanical limitations can impact exercise and how we can mitigate and possibly overcome limitations to have an impactful and balanced exercise routine.  Body Composition Clinical staff conducted group or individual video education with verbal and written material and guidebook.  Patient learns that body composition (ratio of muscle mass to fat mass) is a key component to assessing overall fitness, rather than body weight alone. Increased fat mass, especially visceral belly fat, can put [place] at increased risk for metabolic syndrome, type 2 diabetes, heart disease, and even death. It is recommended to combine diet and exercise (cardiovascular and resistance training) to improve your body composition. Seek guidance from your physician and exercise physiologist before implementing an exercise routine.  Exercise Action Plan Clinical staff conducted group or individual video education with verbal and written material and guidebook.  Patient learns the recommended strategies to achieve and enjoy long-term exercise adherence, including variety, self-motivation, self-efficacy, and positive decision making. Benefits of exercise include fitness, good health, weight management, more energy, better sleep, less stress, and overall well-being.  Medical   Heart Disease Risk Reduction  Clinical staff conducted group or individual video education  with verbal and written material and guidebook.  Patient learns our heart is our most vital organ as it circulates oxygen, nutrients, Shular blood cells, and hormones throughout the entire body, and carries waste away. Data supports a plant-based eating plan like the Pritikin Program for its effectiveness in slowing progression of and reversing heart disease. The video provides a number of recommendations to address heart disease.   Metabolic Syndrome and Belly Fat  Clinical staff conducted group or individual video education with verbal and written material and guidebook.  Patient learns what metabolic syndrome is, how it leads to heart disease, and how one can reverse it and keep it from coming back. You have metabolic syndrome if you have 3 of the following 5 criteria: abdominal obesity, high blood pressure, high triglycerides, low HDL cholesterol, and high blood sugar.  Hypertension and Heart Disease Clinical staff conducted group or individual video education with verbal and written material and guidebook.  Patient learns that high blood pressure, or hypertension, is very common in the [place]. Hypertension is largely due to excessive salt intake, but other important risk factors include being overweight, physical inactivity, drinking too much alcohol, smoking, and not eating enough potassium from fruits and vegetables. High blood pressure is a leading risk factor for heart attack, stroke, congestive heart failure, dementia, kidney failure, and premature death. Long-term effects of excessive salt intake include stiffening of the arteries and thickening of heart muscle and organ damage. Recommendations include ways to reduce hypertension and the risk of heart disease.  Diseases of Our Time - Focusing on Diabetes Clinical staff conducted group or individual video education with verbal and written material and guidebook.  Patient learns why the best way to stop diseases of our time is prevention,  through food and other lifestyle changes. Medicine (such as prescription pills and surgeries) is often only a Band-Aid on the problem, not a long-term solution. Most common diseases of our time include obesity, type 2 diabetes, hypertension, heart disease, and cancer. The Pritikin Program is recommended and has been proven to help reduce, reverse, and/or prevent the damaging effects of metabolic syndrome.  Nutrition   Overview of the Pritikin Eating Plan  Clinical staff conducted group or individual video education with verbal and written material and guidebook.  Patient learns about the Pritikin Eating Plan for disease risk reduction. The Pritikin Eating Plan emphasizes a wide variety of unrefined, minimally-processed carbohydrates, like fruits, vegetables, whole grains, and legumes. Go, Caution, and Stop food choices are explained. Plant-based and lean animal proteins are emphasized. Rationale provided for low sodium intake for blood pressure control, low added sugars for blood sugar stabilization, and low added fats and oils for coronary artery disease risk reduction and weight management.  Calorie Density  Clinical staff conducted group or individual video education with verbal and written material and guidebook.  Patient learns about calorie density and how it impacts the Pritikin Eating Plan. Knowing the characteristics of the food you choose will help you decide whether those foods will lead to weight gain or weight loss, and whether you want to consume more or less of them. Weight loss is usually a side effect of the Pritikin Eating Plan because of its focus on low calorie-dense foods.  Label Reading  Clinical staff conducted group or individual video education with verbal and written material and guidebook.  Patient learns about the Pritikin recommended label reading guidelines and corresponding recommendations regarding calorie density, added  sugars, sodium content, and whole grains.  Dining  Out - Part 1  Clinical staff conducted group or individual video education with verbal and written material and guidebook.  Patient learns that restaurant meals can be sabotaging because they can be so high in calories, fat, sodium, and/or sugar. Patient learns recommended strategies on how to positively address this and avoid unhealthy pitfalls.  Facts on Fats  Clinical staff conducted group or individual video education with verbal and written material and guidebook.  Patient learns that lifestyle modifications can be just as effective, if not more so, as many medications for lowering your risk of heart disease. A Pritikin lifestyle can help to reduce your risk of inflammation and atherosclerosis (cholesterol build-up, or plaque, in the artery walls). Lifestyle interventions such as dietary choices and physical activity address the cause of atherosclerosis. A review of the types of fats and their impact on blood cholesterol levels, along with dietary recommendations to reduce fat intake is also included.  Nutrition Action Plan  Clinical staff conducted group or individual video education with verbal and written material and guidebook.  Patient learns how to incorporate Pritikin recommendations into their lifestyle. Recommendations include planning and keeping personal health goals in mind as an important part of their success.  Healthy Mind-Set    Healthy Minds, Bodies, Hearts  Clinical staff conducted group or individual video education with verbal and written material and guidebook.  Patient learns how to identify when they are stressed. Video will discuss the impact of that stress, as well as the many benefits of stress management. Patient will also be introduced to stress management techniques. The way we think, act, and feel has an impact on our hearts.  How Our Thoughts Can Heal Our Hearts  Clinical staff conducted group or individual video education with verbal and written material and  guidebook.  Patient learns that negative thoughts can cause depression and anxiety. This can result in negative lifestyle behavior and serious health problems. Cognitive behavioral therapy is an effective method to help control our thoughts in order to change and improve our emotional outlook.  Additional Videos:  Exercise    Improving Performance  Clinical staff conducted group or individual video education with verbal and written material and guidebook.  Patient learns to use a non-linear approach by alternating intensity levels and lengths of time spent exercising to help burn more calories and lose more body fat. Cardiovascular exercise helps improve heart health, metabolism, hormonal balance, blood sugar control, and recovery from fatigue. Resistance training improves strength, endurance, balance, coordination, reaction time, metabolism, and muscle mass. Flexibility exercise improves circulation, posture, and balance. Seek guidance from your physician and exercise physiologist before implementing an exercise routine and learn your capabilities and proper form for all exercise.  Introduction to Yoga  Clinical staff conducted group or individual video education with verbal and written material and guidebook.  Patient learns about yoga, a discipline of the coming together of mind, breath, and body. The benefits of yoga include improved flexibility, improved range of motion, better posture and core strength, increased lung function, weight loss, and positive self-image. Yoga's heart health benefits include lowered blood pressure, healthier heart rate, decreased cholesterol and triglyceride levels, improved immune function, and reduced stress. Seek guidance from your physician and exercise physiologist before implementing an exercise routine and learn your capabilities and proper form for all exercise.  Medical   Aging: Enhancing Your Quality of Life  Clinical staff conducted group or individual  video education with verbal  and written material and guidebook.  Patient learns key strategies and recommendations to stay in good physical health and enhance quality of life, such as prevention strategies, having an advocate, securing a Health Care Proxy and Power of Attorney, and keeping a list of medications and system for tracking them. It also discusses how to avoid risk for bone loss.  Biology of Weight Control  Clinical staff conducted group or individual video education with verbal and written material and guidebook.  Patient learns that weight gain occurs because we consume more calories than we burn (eating more, moving less). Even if your body weight is normal, you may have higher ratios of fat compared to muscle mass. Too much body fat puts you at increased risk for cardiovascular disease, heart attack, stroke, type 2 diabetes, and obesity-related cancers. In addition to exercise, following the Pritikin Eating Plan can help reduce your risk.  Decoding Lab Results  Clinical staff conducted group or individual video education with verbal and written material and guidebook.  Patient learns that lab test reflects one measurement whose values change over time and are influenced by many factors, including medication, stress, sleep, exercise, food, hydration, pre-existing medical conditions, and more. It is recommended to use the knowledge from this video to become more involved with your lab results and evaluate your numbers to speak with your doctor.   Diseases of Our Time - Overview  Clinical staff conducted group or individual video education with verbal and written material and guidebook.  Patient learns that according to the CDC, 50% to 70% of chronic diseases (such as obesity, type 2 diabetes, elevated lipids, hypertension, and heart disease) are avoidable through lifestyle improvements including healthier food choices, listening to satiety cues, and increased physical activity.  Sleep  Disorders Clinical staff conducted group or individual video education with verbal and written material and guidebook.  Patient learns how good quality and duration of sleep are important to overall health and well-being. Patient also learns about sleep disorders and how they impact health along with recommendations to address them, including discussing with a physician.  Nutrition  Dining Out - Part 2 Clinical staff conducted group or individual video education with verbal and written material and guidebook.  Patient learns how to plan ahead and communicate in order to maximize their dining experience in a healthy and nutritious manner. Included are recommended food choices based on the type of restaurant the patient is visiting.   Fueling a Banker conducted group or individual video education with verbal and written material and guidebook.  There is a strong connection between our food choices and our health. Diseases like obesity and type 2 diabetes are very prevalent and are in large-part due to lifestyle choices. The Pritikin Eating Plan provides plenty of food and hunger-curbing satisfaction. It is easy to follow, affordable, and helps reduce health risks.  Menu Workshop  Clinical staff conducted group or individual video education with verbal and written material and guidebook.  Patient learns that restaurant meals can sabotage health goals because they are often packed with calories, fat, sodium, and sugar. Recommendations include strategies to plan ahead and to communicate with the manager, chef, or server to help order a healthier meal.  Planning Your Eating Strategy  Clinical staff conducted group or individual video education with verbal and written material and guidebook.  Patient learns about the Pritikin Eating Plan and its benefit of reducing the risk of disease. The Pritikin Eating Plan does not focus on calories.  Instead, it emphasizes high-quality,  nutrient-rich foods. By knowing the characteristics of the foods, we choose, we can determine their calorie density and make informed decisions.  Targeting Your Nutrition Priorities  Clinical staff conducted group or individual video education with verbal and written material and guidebook.  Patient learns that lifestyle habits have a tremendous impact on disease risk and progression. This video provides eating and physical activity recommendations based on your personal health goals, such as reducing LDL cholesterol, losing weight, preventing or controlling type 2 diabetes, and reducing high blood pressure.  Vitamins and Minerals  Clinical staff conducted group or individual video education with verbal and written material and guidebook.  Patient learns different ways to obtain key vitamins and minerals, including through a recommended healthy diet. It is important to discuss all supplements you take with your doctor.   Healthy Mind-Set    Smoking Cessation  Clinical staff conducted group or individual video education with verbal and written material and guidebook.  Patient learns that cigarette smoking and tobacco addiction pose a serious health risk which affects millions of people. Stopping smoking will significantly reduce the risk of heart disease, lung disease, and many forms of cancer. Recommended strategies for quitting are covered, including working with your doctor to develop a successful plan.  Culinary   Becoming a Set designer conducted group or individual video education with verbal and written material and guidebook.  Patient learns that cooking at home can be healthy, cost-effective, quick, and puts them in control. Keys to cooking healthy recipes will include looking at your recipe, assessing your equipment needs, planning ahead, making it simple, choosing cost-effective seasonal ingredients, and limiting the use of added fats, salts, and sugars.  Cooking -  Breakfast and Snacks  Clinical staff conducted group or individual video education with verbal and written material and guidebook.  Patient learns how important breakfast is to satiety and nutrition through the entire day. Recommendations include key foods to eat during breakfast to help stabilize blood sugar levels and to prevent overeating at meals later in the day. Planning ahead is also a key component.  Cooking - Educational psychologist conducted group or individual video education with verbal and written material and guidebook.  Patient learns eating strategies to improve overall health, including an approach to cook more at home. Recommendations include thinking of animal protein as a side on your plate rather than center stage and focusing instead on lower calorie dense options like vegetables, fruits, whole grains, and plant-based proteins, such as beans. Making sauces in large quantities to freeze for later and leaving the skin on your vegetables are also recommended to maximize your experience.  Cooking - Healthy Salads and Dressing Clinical staff conducted group or individual video education with verbal and written material and guidebook.  Patient learns that vegetables, fruits, whole grains, and legumes are the foundations of the Pritikin Eating Plan. Recommendations include how to incorporate each of these in flavorful and healthy salads, and how to create homemade salad dressings. Proper handling of ingredients is also covered. Cooking - Soups and [employer] - Soups and Desserts Clinical staff conducted group or individual video education with verbal and written material and guidebook.  Patient learns that Pritikin soups and desserts make for easy, nutritious, and delicious snacks and meal components that are low in sodium, fat, sugar, and calorie density, while high in vitamins, minerals, and filling fiber. Recommendations include simple and healthy ideas for soups and  desserts.   Overview     The Pritikin Solution Program Overview Clinical staff conducted group or individual video education with verbal and written material and guidebook.  Patient learns that the results of the Pritikin Program have been documented in more than 100 articles published in peer-reviewed journals, and the benefits include reducing risk factors for (and, in some cases, even reversing) high cholesterol, high blood pressure, type 2 diabetes, obesity, and more! An overview of the three key pillars of the Pritikin Program will be covered: eating well, doing regular exercise, and having a healthy mind-set.  WORKSHOPS  Exercise: Exercise Basics: Building Your Action Plan Clinical staff led group instruction and group discussion with PowerPoint presentation and patient guidebook. To enhance the learning environment the use of posters, models and videos may be added. At the conclusion of this workshop, patients will comprehend the difference between physical activity and exercise, as well as the benefits of incorporating both, into their routine. Patients will understand the FITT (Frequency, Intensity, Time, and Type) principle and how to use it to build an exercise action plan. In addition, safety concerns and other considerations for exercise and cardiac rehab will be addressed by the presenter. The purpose of this lesson is to promote a comprehensive and effective weekly exercise routine in order to improve patients' overall level of fitness.   Managing Heart Disease: Your Path to a Healthier Heart Clinical staff led group instruction and group discussion with PowerPoint presentation and patient guidebook. To enhance the learning environment the use of posters, models and videos may be added.At the conclusion of this workshop, patients will understand the anatomy and physiology of the heart. Additionally, they will understand how Pritikin's three pillars impact the risk factors, the  progression, and the management of heart disease.  The purpose of this lesson is to provide a high-level overview of the heart, heart disease, and how the Pritikin lifestyle positively impacts risk factors.  Exercise Biomechanics Clinical staff led group instruction and group discussion with PowerPoint presentation and patient guidebook. To enhance the learning environment the use of posters, models and videos may be added. Patients will learn how the structural parts of their bodies function and how these functions impact their daily activities, movement, and exercise. Patients will learn how to promote a neutral spine, learn how to manage pain, and identify ways to improve their physical movement in order to promote healthy living. The purpose of this lesson is to expose patients to common physical limitations that impact physical activity. Participants will learn practical ways to adapt and manage aches and pains, and to minimize their effect on regular exercise. Patients will learn how to maintain good posture while sitting, walking, and lifting.  Balance Training and Fall Prevention  Clinical staff led group instruction and group discussion with PowerPoint presentation and patient guidebook. To enhance the learning environment the use of posters, models and videos may be added. At the conclusion of this workshop, patients will understand the importance of their sensorimotor skills (vision, proprioception, and the vestibular system) in maintaining their ability to balance as they age. Patients will apply a variety of balancing exercises that are appropriate for their current level of function. Patients will understand the common causes for poor balance, possible solutions to these problems, and ways to modify their physical environment in order to minimize their fall risk. The purpose of this lesson is to teach patients about the importance of maintaining balance as they age and ways to  minimize their risk of  falling.  WORKSHOPS   Nutrition:  Fueling a Ship broker led group instruction and group discussion with PowerPoint presentation and patient guidebook. To enhance the learning environment the use of posters, models and videos may be added. Patients will review the foundational principles of the Pritikin Eating Plan and understand what constitutes a serving size in each of the food groups. Patients will also learn Pritikin-friendly foods that are better choices when away from home and review make-ahead meal and snack options. Calorie density will be reviewed and applied to three nutrition priorities: weight maintenance, weight loss, and weight gain. The purpose of this lesson is to reinforce (in a group setting) the key concepts around what patients are recommended to eat and how to apply these guidelines when away from home by planning and selecting Pritikin-friendly options. Patients will understand how calorie density may be adjusted for different weight management goals.  Mindful Eating  Clinical staff led group instruction and group discussion with PowerPoint presentation and patient guidebook. To enhance the learning environment the use of posters, models and videos may be added. Patients will briefly review the concepts of the Pritikin Eating Plan and the importance of low-calorie dense foods. The concept of mindful eating will be introduced as well as the importance of paying attention to internal hunger signals. Triggers for non-hunger eating and techniques for dealing with triggers will be explored. The purpose of this lesson is to provide patients with the opportunity to review the basic principles of the Pritikin Eating Plan, discuss the value of eating mindfully and how to measure internal cues of hunger and fullness using the Hunger Scale. Patients will also discuss reasons for non-hunger eating and learn strategies to use for controlling emotional  eating.  Targeting Your Nutrition Priorities Clinical staff led group instruction and group discussion with PowerPoint presentation and patient guidebook. To enhance the learning environment the use of posters, models and videos may be added. Patients will learn how to determine their genetic susceptibility to disease by reviewing their family history. Patients will gain insight into the importance of diet as part of an overall healthy lifestyle in mitigating the impact of genetics and other environmental insults. The purpose of this lesson is to provide patients with the opportunity to assess their personal nutrition priorities by looking at their family history, their own health history and current risk factors. Patients will also be able to discuss ways of prioritizing and modifying the Pritikin Eating Plan for their highest risk areas  Menu  Clinical staff led group instruction and group discussion with PowerPoint presentation and patient guidebook. To enhance the learning environment the use of posters, models and videos may be added. Using menus brought in from [employer], or printed from [employer], patients will apply the Pritikin dining out guidelines that were presented in the [employer] video. Patients will also be able to practice these guidelines in a variety of provided scenarios. The purpose of this lesson is to provide patients with the opportunity to practice hands-on learning of the Pritikin Dining Out guidelines with actual menus and practice scenarios.  Label Reading Clinical staff led group instruction and group discussion with PowerPoint presentation and patient guidebook. To enhance the learning environment the use of posters, models and videos may be added. Patients will review and discuss the Pritikin label reading guidelines presented in Pritikin's Label Reading Educational series video. Using fool labels brought in from local grocery stores and  markets, patients will apply the label  reading guidelines and determine if the packaged food meet the Pritikin guidelines. The purpose of this lesson is to provide patients with the opportunity to review, discuss, and practice hands-on learning of the Pritikin Label Reading guidelines with actual packaged food labels. Cooking School  Pritikin's [employer] are designed to teach patients ways to prepare quick, simple, and affordable recipes at home. The importance of nutrition's role in chronic disease risk reduction is reflected in its emphasis in the overall Pritikin program. By learning how to prepare essential core Pritikin Eating Plan recipes, patients will increase control over what they eat; be able to customize the flavor of foods without the use of added salt, sugar, or fat; and improve the quality of the food they consume. By learning a set of core recipes which are easily assembled, quickly prepared, and affordable, patients are more likely to prepare more healthy foods at home. These workshops focus on convenient breakfasts, simple entres, side dishes, and desserts which can be prepared with minimal effort and are consistent with nutrition recommendations for cardiovascular risk reduction. Cooking [employer] are taught by a Armed forces logistics/support/administrative officer (RD) who has been trained by the [employer]. The chef or RD has a clear understanding of the importance of minimizing - if not completely eliminating - added fat, sugar, and sodium in recipes. Throughout the series of Cooking School Workshop sessions, patients will learn about healthy ingredients and efficient methods of cooking to build confidence in their capability to prepare    Cooking School weekly topics:  Adding Flavor- Sodium-Free  Fast and Healthy Breakfasts  Powerhouse Plant-Based Proteins  Satisfying Salads and Dressings  Simple Sides and Sauces  International Cuisine-Spotlight on the [employer]  Zones  Delicious Desserts  Savory Soups  [employer] - Meals in a Astronomer Appetizers and Snacks  Comforting Weekend Breakfasts  One-Pot Wonders   Fast Evening Meals  Landscape architect Your Pritikin Plate  WORKSHOPS   Healthy Mindset (Psychosocial):  Focused Goals, Sustainable Changes Clinical staff led group instruction and group discussion with PowerPoint presentation and patient guidebook. To enhance the learning environment the use of posters, models and videos may be added. Patients will be able to apply effective goal setting strategies to establish at least one personal goal, and then take consistent, meaningful action toward that goal. They will learn to identify common barriers to achieving personal goals and develop strategies to overcome them. Patients will also gain an understanding of how our mind-set can impact our ability to achieve goals and the importance of cultivating a positive and growth-oriented mind-set. The purpose of this lesson is to provide patients with a deeper understanding of how to set and achieve personal goals, as well as the tools and strategies needed to overcome common obstacles which may arise along the way.  From Head to Heart: The Power of a Healthy Outlook  Clinical staff led group instruction and group discussion with PowerPoint presentation and patient guidebook. To enhance the learning environment the use of posters, models and videos may be added. Patients will be able to recognize and describe the impact of emotions and mood on physical health. They will discover the importance of self-care and explore self-care practices which may work for them. Patients will also learn how to utilize the 4 C's to cultivate a healthier outlook and better manage stress and challenges. The purpose of this lesson is to demonstrate to patients how a healthy outlook is an essential part of  maintaining good health, especially as they continue their  cardiac rehab journey.  Healthy Sleep for a Healthy Heart Clinical staff led group instruction and group discussion with PowerPoint presentation and patient guidebook. To enhance the learning environment the use of posters, models and videos may be added. At the conclusion of this workshop, patients will be able to demonstrate knowledge of the importance of sleep to overall health, well-being, and quality of life. They will understand the symptoms of, and treatments for, common sleep disorders. Patients will also be able to identify daytime and nighttime behaviors which impact sleep, and they will be able to apply these tools to help manage sleep-related challenges. The purpose of this lesson is to provide patients with a general overview of sleep and outline the importance of quality sleep. Patients will learn about a few of the most common sleep disorders. Patients will also be introduced to the concept of "sleep hygiene," and discover ways to self-manage certain sleeping problems through simple daily behavior changes. Finally, the workshop will motivate patients by clarifying the links between quality sleep and their goals of heart-healthy living.   Recognizing and Reducing Stress Clinical staff led group instruction and group discussion with PowerPoint presentation and patient guidebook. To enhance the learning environment the use of posters, models and videos may be added. At the conclusion of this workshop, patients will be able to understand the types of stress reactions, differentiate between acute and chronic stress, and recognize the impact that chronic stress has on their health. They will also be able to apply different coping mechanisms, such as reframing negative self-talk. Patients will have the opportunity to practice a variety of stress management techniques, such as deep abdominal breathing, progressive muscle relaxation, and/or guided imagery.  The purpose of this lesson is to educate  patients on the role of stress in their lives and to provide healthy techniques for coping with it.  Learning Barriers/Preferences:  Learning Barriers/Preferences - 06/18/22 1205       Learning Barriers/Preferences   Learning Barriers Sight   wears glasses   Learning Preferences Audio;Computer/Internet;Group Instruction;Individual Instruction;Skilled Demonstration;Verbal Instruction;Video;Written Material;Pictoral             Education Topics:  Knowledge Questionnaire Score:  Knowledge Questionnaire Score - 06/18/22 1205       Knowledge Questionnaire Score   Pre Score 20/24             Core Components/Risk Factors/Patient Goals at Admission:  Personal Goals and Risk Factors at Admission - 06/18/22 1206       Core Components/Risk Factors/Patient Goals on Admission    Weight Management Yes;Obesity;Weight Loss    Intervention Obesity: Provide education and appropriate resources to help participant work on and attain dietary goals.;Weight Management/Obesity: Establish reasonable short term and long term weight goals.;Weight Management: Provide education and appropriate resources to help participant work on and attain dietary goals.;Weight Management: Develop a combined nutrition and exercise program designed to reach desired caloric intake, while maintaining appropriate intake of nutrient and fiber, sodium and fats, and appropriate energy expenditure required for the weight goal.    Admit Weight 196 lb 13.9 oz (89.3 kg)    Goal Weight: Long Term 150 lb (68 kg)    Expected Outcomes Short Term: Continue to assess and modify interventions until short term weight is achieved;Long Term: Adherence to nutrition and physical activity/exercise program aimed toward attainment of established weight goal;Weight Loss: Understanding of general recommendations for a balanced deficit meal plan, which promotes 1-2 lb weight  loss per week and includes a negative energy balance of [phone]  kcal/d;Understanding recommendations for meals to include 15-35% energy as protein, 25-35% energy from fat, 35-60% energy from carbohydrates, less than 200mg  of dietary cholesterol, 20-35 gm of total fiber daily;Understanding of distribution of calorie intake throughout the day with the consumption of 4-5 meals/snacks    Hypertension Yes    Intervention Provide education on lifestyle modifcations including regular physical activity/exercise, weight management, moderate sodium restriction and increased consumption of fresh fruit, vegetables, and low fat dairy, alcohol moderation, and smoking cessation.;Monitor prescription use compliance.    Expected Outcomes Short Term: Continued assessment and intervention until BP is < 140/69mm HG in hypertensive participants. < 130/11mm HG in hypertensive participants with diabetes, heart failure or chronic kidney disease.;Long Term: Maintenance of blood pressure at goal levels.    Lipids Yes    Intervention Provide education and support for participant on nutrition & aerobic/resistive exercise along with prescribed medications to achieve LDL 70mg , HDL >40mg .    Expected Outcomes Short Term: Participant states understanding of desired cholesterol values and is compliant with medications prescribed. Participant is following exercise prescription and nutrition guidelines.;Long Term: Cholesterol controlled with medications as prescribed, with individualized exercise RX and with personalized nutrition plan. Value goals: LDL < 70mg , HDL > 40 mg.    Stress Yes    Intervention Offer individual and/or small group education and counseling on adjustment to heart disease, stress management and health-related lifestyle change. Teach and support self-help strategies.;Refer participants experiencing significant psychosocial distress to appropriate mental health specialists for further evaluation and treatment. When possible, include family members and significant others in  education/counseling sessions.    Expected Outcomes Short Term: Participant demonstrates changes in health-related behavior, relaxation and other stress management skills, ability to obtain effective social support, and compliance with psychotropic medications if prescribed.;Long Term: Emotional wellbeing is indicated by absence of clinically significant psychosocial distress or social isolation.             Core Components/Risk Factors/Patient Goals Review:   Goals and Risk Factor Review     Row Name 06/23/22 1701 06/27/22 1714 07/23/22 1738         Core Components/Risk Factors/Patient Goals Review   Personal Goals Review Weight Management/Obesity;Hypertension;Lipids;Stress Weight Management/Obesity;Hypertension;Lipids;Stress Weight Management/Obesity;Hypertension;Lipids;Stress     Review Rebekah started intensive caridac rehab on 06/23/22 and did well with exercise. Vital signs were stable Mandeep started intensive caridac rehab on 06/23/22 and is off to a good start to exercise. Vital signs were stable. Reports left ankle swelling Dr Mallory Shirk office called and notified Laconya is doing well with exercise at intensive . Vital signs have been  stable.     Expected Outcomes Tamela Gammon will continue to participate in intensive cardiac rehab for exercise, nutrition and lifestyle modifications Tamela Gammon will continue to participate in intensive cardiac rehab for exercise, nutrition and lifestyle modifications Tamela Gammon will continue to participate in intensive cardiac rehab for exercise, nutrition and lifestyle modifications              Core Components/Risk Factors/Patient Goals at Discharge (Final Review):   Goals and Risk Factor Review - 07/23/22 1738       Core Components/Risk Factors/Patient Goals Review   Personal Goals Review Weight Management/Obesity;Hypertension;Lipids;Stress    Review Arienna is doing well with exercise at intensive . Vital signs have been  stable.    Expected  Outcomes Tamela Gammon will continue to participate in intensive cardiac rehab for exercise, nutrition and lifestyle modifications  ITP Comments:  ITP Comments     Row Name 06/18/22 1023 06/23/22 1649 07/23/22 1734       ITP Comments Dr. Armanda Magic medical director. Introduction to [facility]kin education program/ intensive cardiac rehab. Initial orientation packet reviewed with patient. 30 Day ITP Reivew. Kerly started intesive cardiac rehab on 06/23/22 and did well with exercise 30 Day ITP Reivew. Cyana has good attendance and participation in  intensive cardiac rehab              Comments: See ITP comments.Thayer Headings RN BSN

## 2022-07-25 ENCOUNTER — Encounter (HOSPITAL_COMMUNITY): Payer: 59

## 2022-07-28 ENCOUNTER — Encounter (HOSPITAL_COMMUNITY)
Admission: RE | Admit: 2022-07-28 | Discharge: 2022-07-28 | Disposition: A | Discharge status: Home / Self Care | Payer: 59 | Source: Ambulatory Visit | Attending: Cardiology | Admitting: Cardiology

## 2022-07-28 DIAGNOSIS — I2089 Other forms of angina pectoris: Secondary | ICD-10-CM | POA: Diagnosis not present

## 2022-07-30 ENCOUNTER — Encounter (HOSPITAL_COMMUNITY)
Admission: RE | Admit: 2022-07-30 | Discharge: 2022-07-30 | Disposition: A | Discharge status: Home / Self Care | Payer: 59 | Source: Ambulatory Visit | Attending: Cardiology | Admitting: Cardiology

## 2022-07-30 DIAGNOSIS — I2089 Other forms of angina pectoris: Secondary | ICD-10-CM

## 2022-08-01 ENCOUNTER — Encounter (HOSPITAL_COMMUNITY)
Admission: RE | Admit: 2022-08-01 | Discharge: 2022-08-01 | Disposition: A | Discharge status: Home / Self Care | Payer: 59 | Source: Ambulatory Visit | Attending: Cardiology | Admitting: Cardiology

## 2022-08-01 DIAGNOSIS — I2089 Other forms of angina pectoris: Secondary | ICD-10-CM

## 2022-08-04 ENCOUNTER — Encounter (HOSPITAL_COMMUNITY)
Admission: RE | Admit: 2022-08-04 | Discharge: 2022-08-04 | Disposition: A | Discharge status: Home / Self Care | Payer: 59 | Source: Ambulatory Visit | Attending: Cardiology | Admitting: Cardiology

## 2022-08-04 DIAGNOSIS — I2089 Other forms of angina pectoris: Secondary | ICD-10-CM

## 2022-08-06 ENCOUNTER — Encounter (HOSPITAL_COMMUNITY): Payer: 59

## 2022-08-08 ENCOUNTER — Encounter (HOSPITAL_COMMUNITY): Payer: 59

## 2022-08-08 ENCOUNTER — Other Ambulatory Visit: Payer: Self-pay | Admitting: Family Medicine

## 2022-08-08 DIAGNOSIS — Z1231 Encounter for screening mammogram for malignant neoplasm of breast: Secondary | ICD-10-CM

## 2022-08-13 ENCOUNTER — Telehealth (HOSPITAL_COMMUNITY): Payer: Self-pay | Admitting: *Deleted

## 2022-08-13 ENCOUNTER — Encounter (HOSPITAL_COMMUNITY): Payer: 59

## 2022-08-14 ENCOUNTER — Encounter: Payer: Self-pay | Admitting: Cardiology

## 2022-08-14 ENCOUNTER — Ambulatory Visit: Payer: 59 | Attending: Cardiology | Admitting: Cardiology

## 2022-08-14 VITALS — BP 116/82 | HR 68 | Ht 66.0 in | Wt 189.4 lb

## 2022-08-14 DIAGNOSIS — G459 Transient cerebral ischemic attack, unspecified: Secondary | ICD-10-CM | POA: Diagnosis not present

## 2022-08-14 DIAGNOSIS — I10 Essential (primary) hypertension: Secondary | ICD-10-CM | POA: Diagnosis not present

## 2022-08-14 DIAGNOSIS — I2510 Atherosclerotic heart disease of native coronary artery without angina pectoris: Secondary | ICD-10-CM | POA: Diagnosis not present

## 2022-08-14 DIAGNOSIS — E782 Mixed hyperlipidemia: Secondary | ICD-10-CM

## 2022-08-14 LAB — LIPID PANEL
Chol/HDL Ratio: 2.3 ratio (ref 0.0–4.4)
Cholesterol, Total: 124 mg/dL (ref 100–199)
HDL: 54 mg/dL (ref >39)
LDL Chol Calc (NIH): 55 mg/dL (ref 0–99)
Triglycerides: 73 mg/dL (ref 0–149)
VLDL Cholesterol Cal: 15 mg/dL (ref 5–40)

## 2022-08-14 MED ORDER — HYDROCHLOROTHIAZIDE 25 MG PO TABS: 25.0000 mg | ORAL_TABLET | Freq: Every day | ORAL | 3 refills | Status: DC

## 2022-08-14 NOTE — Progress Notes (Signed)
Cardiology Office Note:    Date:  08/15/2022   ID:  Lindsey [name], Lindsey [name] 09/25/1970, MRN [id number]  PCP:  [name] (Inactive)  Cardiologist:  Thomasene Ripple, DO  Electrophysiologist:  None   Referring MD: No ref. provider found   " I am having chest pain that is getting worse"  History of Present Illness:    Lindsey [name] is a 52 y.o. female with a hx of CAD, with hx of apical infarct in 2000, STEMI in 2009 with BMS to the proximal LAD with history of apical thrombus, hypertension, hyperlipidemia, TIA, history of MCA infarct in April 2019 AT III deficiency on anticoagulation, current smoker here today to be evaluated for chest pain.  The patient normally follows with Dr. Olga Millers and was last seen by Bailey Mech, NP back in July 2019.  I saw the patient on February 18, 2022 at that time she experiencing chest discomfort that was concerning.  Sent her for heart catheterization.  Heart catheterization showed evidence that her stent had been occluded with collateral circulation noted at that time.   I saw the patient in February 2024 at that time she was post heart catheterization with no intervention.  I added Ranexa to her Imdur. She tells me today that she is not taking the Imdur.  She only has been taking it. She felt a lot better. She also notes that she is having increasing swelling and she is has undergone issues and she has been told by her orthodontist that this is caused by the amlodipine.  Past Medical History:  Diagnosis Date   Anemia    Antithrombin III deficiency (HCC)    ?pt not sure if true diagnosis   Anxiety    Apical mural thrombus    Blood transfusion    Cholecystitis 07/2016   Coronary artery disease    a. apical LAD infarction '00. b. NSTEMI s/p BMS to prox LAD '09. c. Cath 01/2015: stable LAD stent, otherwise minimal nonobstructive CAD.   GERD (gastroesophageal reflux disease)    Hyperlipidemia    Hypertension    Myocardial  infarction (HCC)    Noncompliance with medication regimen    a. h/o noncompliance with med regimen (previous running out of Coumadin).   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   Stroke (HCC)    assoc with short term memory loss and right peripheral vision loss; age 52    TIA (transient ischemic attack) 2010   Tobacco abuse     Past Surgical History:  Procedure Laterality Date   CARDIAC CATHETERIZATION     CARDIAC CATHETERIZATION N/A 01/29/2015   Procedure: Left Heart Cath and Coronary Angiography;  Surgeon: Peter M Swaziland, MD;  Location: [facility] INVASIVE CV LAB;  Service: Cardiovascular;  Laterality: N/A;   CHOLECYSTECTOMY N/A 08/14/2016   Procedure: LAPAROSCOPIC CHOLECYSTECTOMY;  Surgeon: Gaynelle Adu, MD;  Location: [facility] OR;  Service: General;  Laterality: N/A;   COLONOSCOPY N/A 03/29/2014   Procedure: COLONOSCOPY;  Surgeon: Louis Meckel, MD;  Location: [facility] ENDOSCOPY;  Service: Endoscopy;  Laterality: N/A;   CORONARY ANGIOPLASTY     ESOPHAGOGASTRODUODENOSCOPY N/A 06/09/2012   Procedure: ESOPHAGOGASTRODUODENOSCOPY (EGD);  Surgeon: Theda Belfast, MD;  Location: [facility] ENDOSCOPY;  Service: Endoscopy;  Laterality: N/A;   ESOPHAGOGASTRODUODENOSCOPY N/A 08/02/2013   Procedure: ESOPHAGOGASTRODUODENOSCOPY (EGD);  Surgeon: Meryl Dare, MD;  Location: [facility] ENDOSCOPY;  Service: Endoscopy;  Laterality: N/A;   IR ANGIO INTRA EXTRACRAN SEL COM CAROTID INNOMINATE  UNI R MOD SED  07/08/2017   LEFT HEART CATH AND CORONARY ANGIOGRAPHY N/A 02/20/2022   Procedure: LEFT HEART CATH AND CORONARY ANGIOGRAPHY;  Surgeon: Lennette Bihari, MD;  Location: [facility] INVASIVE CV LAB;  Service: Cardiovascular;  Laterality: N/A;   LEFT HEART CATHETERIZATION WITH CORONARY ANGIOGRAM N/A 12/24/2011   Procedure: LEFT HEART CATHETERIZATION WITH CORONARY ANGIOGRAM;  Surgeon: Kathleene Hazel, MD;  Location: [facility] CATH LAB;  Service: Cardiovascular;  Laterality: N/A;   RADIOLOGY WITH ANESTHESIA N/A 07/08/2017    Procedure: IR WITH ANESTHESIA;  Surgeon: Radiologist, Medication, MD;  Location: [facility] OR;  Service: Radiology;  Laterality: N/A;   TUBAL LIGATION      Current Medications: Current Meds  Medication Sig   atorvastatin (LIPITOR) 40 MG tablet Take 1 tablet (40 mg total) by mouth daily.   clopidogrel (PLAVIX) 75 MG tablet Take 1 tablet (75 mg total) by mouth daily.   diclofenac Sodium (VOLTAREN) 1 % GEL Apply 4 g topically 4 (four) times daily.   fluticasone (FLONASE) 50 MCG/ACT nasal spray Place 2 sprays into both nostrils daily.   hydrochlorothiazide (HYDRODIURIL) 25 MG tablet Take 1 tablet (25 mg total) by mouth daily.   HYDROcodone-acetaminophen (NORCO/VICODIN) 5-325 MG tablet Take 1-2 tablets by mouth every 6 (six) hours as needed.   nitroGLYCERIN (NITROSTAT) 0.4 MG SL tablet Place 1 tablet (0.4 mg total) under the tongue every 5 (five) minutes as needed for chest pain (up to 3 doses).   pantoprazole (PROTONIX) 40 MG tablet Take 1 tablet (40 mg total) by mouth 2 (two) times daily.   ranolazine (RANEXA) 500 MG 12 hr tablet Take 1 tablet (500 mg total) by mouth 2 (two) times daily.   rivaroxaban (XARELTO) 20 MG TABS tablet Take 1 tablet (20 mg total) by mouth daily.   traZODone (DESYREL) 50 MG tablet TAKE 1 Tablet BY MOUTH ONCE DAILY at bedtime (bedtime)   [DISCONTINUED] amLODipine (NORVASC) 10 MG tablet Take 1 tablet (10 mg total) by mouth daily.     Allergies:   Patient has no known allergies.   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  Tobacco Use   Smoking status: Former    Packs/day: 0.20    Years: 1.00    Additional pack years: 0.00    Total pack years: 0.20    Types: Cigarettes    Quit date: 10/27/2018    Years since quitting: 3.8   Smokeless  tobacco: Never   Tobacco comments:    2018  " i STILL SMOKE 3  CIGARETTES A DAY "  Vaping Use   Vaping Use: Former  Substance and Sexual Activity   Alcohol use: Yes    Alcohol/week: 0.0 standard drinks of alcohol    Comment: Social drinker   Drug use: Not Currently    Types: Cocaine    Comment: hx of use x 1 in the past   Sexual activity: Not Currently    Birth control/protection: Surgical  Other Topics Concern   Not on file  Social History Narrative   Not on file   Social Determinants of Health   Financial Resource Strain: Not on file  Food Insecurity: Not on file  Transportation Needs: Not on file  Physical Activity: Not on  file  Stress: Not on file  Social Connections: Not on file     Family History: The patient's family history includes Breast cancer in her maternal aunt; Heart disease in her brother; Stroke in her father.  ROS:   Review of Systems  Constitution: Negative for decreased appetite, fever and weight gain.  HENT: Negative for congestion, ear discharge, hoarse voice and sore throat.   Eyes: Negative for discharge, redness, vision loss in right eye and visual halos.  Cardiovascular: Reports chest pain.negative for  dyspnea on exertion, leg swelling, orthopnea and palpitations.  Respiratory: Negative for cough, hemoptysis, shortness of breath and snoring.   Endocrine: Negative for heat intolerance and polyphagia.  Hematologic/Lymphatic: Negative for bleeding problem. Does not bruise/bleed easily.  Skin: Negative for flushing, nail changes, rash and suspicious lesions.  Musculoskeletal: Negative for arthritis, joint pain, muscle cramps, myalgias, neck pain and stiffness.  Gastrointestinal: Negative for abdominal pain, bowel incontinence, diarrhea and excessive appetite.  Genitourinary: Negative for decreased libido, genital sores and incomplete emptying.  Neurological: Negative for brief paralysis, focal weakness, headaches and loss of balance.   Psychiatric/Behavioral: Negative for altered mental status, depression and suicidal ideas.  Allergic/Immunologic: Negative for HIV exposure and persistent infections.    EKGs/Labs/Other Studies Reviewed:    The following studies were reviewed today:   EKG:  None today  LHC 02/20/2022   Prox LAD to Mid LAD lesion is 100% stenosed.   The left ventricular ejection fraction is 50-55% by visual estimate.   Total occlusion of the proximal LAD at the proximal stent site.   Normal large ramus intermediate vessel; normal left circumflex coronary artery, and normal dominant RCA with extensive collateralization to the LAD to the RCA up to the distal portion of the stented segment.   There is mild mid inferior hypocontractility with suggestion of mild LVH and EF estimated at 50 to 55%.  LVEDP 18 mmHg.   RECOMMENDATION: Medical therapy with well collateralized LAD from the RCA. Continue Plavix and resume Xarelto commencing tomorrow.  Aggressive lipid-lowering therapy with target LDL less than 70.  Will titrate isosorbide to 60 mg.  Continue additional antiischemic medical therapy.  Of note, the patient had been on combined antiplatelet and anticoagulation therapy and had run out of her Plavix approximately 1 month ago and 2 weeks later most likely occluded her proximal LAD stent on Xarelto.     Nuclear stress test July 2019 The left ventricular ejection fraction is mildly decreased (45-54%). Nuclear stress EF: 54%. No T wave inversion was noted during stress. There was no ST segment deviation noted during stress. Defect 1: There is a medium defect of severe severity. Findings consistent with prior myocardial infarction. This is a low risk study.  Medium size, severe intensity fixed apical perfusion defect, suggestive of scar. No reversible ischemia. LVEF 54% with apical dyskinesis. This is a low risk study.  TTE 07/09/2017 Study Conclusions  - Left ventricle: The cavity size was normal. Wall  thickness was    increased in a pattern of mild LVH. Systolic function was normal.    The estimated ejection fraction was in the range of 55% to 60%.    Akinesis of the apical myocardium. Doppler parameters are    consistent with abnormal left ventricular relaxation (grade 1    diastolic dysfunction). There was a medium-sized, fixed,  apicalthrombus.  - Left atrium: The atrium was mildly dilated.    Cardiac MR 01/2015 FINDINGS: The atria were normal in size. RV normal in size  and function. Mild MR. Normal aortic valve. Trivial pericardial effusion No ASD/VSD. The distal anterior wall, septum and entire apex were akinetic   The quantitative EF was 50% with normal mid and basal function. Mild LVH septal thickness 12 mm.   Delayed enhancement images with gadolinium showed full thickness scar involving the distal anterior wall, septum and apex. Long TI imaging post contrast showed a small amount   Of mural apical thrombus   IMPRESSION: 1) Mild LVE/LVH EF 50% with distal anterior wall, septal and apical akinesis   2) Post Gadolinium images with full thickness scar involving above areas   3) Long TI inversion recovery sequences post contrast show a small amount of mural apical thrombus   4) Mild MR   5) Trivial pericardial effusion   Charlton Haws     Electronically Signed   By: Charlton Haws M.D.   On: 01/31/2015 16:49    LHC 2016 Prox RCA lesion, 20% stenosed. Mid LAD to Dist LAD lesion, 20% stenosed. There is mild left ventricular systolic dysfunction.   1. Minimal nonobstructive CAD. Patent stent in the LAD 2. Mild LV dysfunction with chronic akinesis of the apex. 3. Complex filling defect in the LV apex consistent with thrombus.   Plan: resume full anticoagulation. Will assess with Echo.   Recent Labs: 12/13/2021: ALT 25 02/18/2022: BUN 10; Creatinine, Ser 0.64; Hemoglobin 12.4; Platelets 247; Potassium 5.2; Sodium 143  Recent Lipid Panel    Component Value  Date/Time   CHOL 124 08/14/2022 0901   TRIG 73 08/14/2022 0901   HDL 54 08/14/2022 0901   CHOLHDL 2.3 08/14/2022 0901   CHOLHDL 2.2 11/03/2018 0834   VLDL 13 11/03/2018 0834   LDLCALC 55 08/14/2022 0901    Physical Exam:    VS:  BP 116/82 (BP Location: Left Arm, Patient Position: Sitting, Cuff Size: Normal)   Pulse 68   Ht 5\' 6"  (1.676 m)   Wt 189 lb 6.4 oz (85.9 kg)   LMP 04/02/2015   SpO2 97%   BMI 30.57 kg/m     Wt Readings from Last 3 Encounters:  08/14/22 189 lb 6.4 oz (85.9 kg)  06/18/22 196 lb 13.9 oz (89.3 kg)  05/13/22 195 lb 12.8 oz (88.8 kg)     GEN: Well nourished, well developed in no acute distress HEENT: Normal NECK: No JVD; No carotid bruits LYMPHATICS: No lymphadenopathy CARDIAC: S1S2 noted,RRR, no murmurs, rubs, gallops RESPIRATORY:  Clear to auscultation without rales, wheezing or rhonchi  ABDOMEN: Soft, non-tender, non-distended, +bowel sounds, no guarding. EXTREMITIES: No edema, No cyanosis, no clubbing MUSCULOSKELETAL:  No deformity  SKIN: Warm and dry NEUROLOGIC:  Alert and oriented x 3, non-focal PSYCHIATRIC:  Normal affect, good insight  ASSESSMENT:    1. Mixed hyperlipidemia   2. Essential hypertension   3. Atherosclerosis of native coronary artery of native heart without angina pectoris   4. TIA (transient ischemic attack)      PLAN:    Today will stop the amlodipine.  Increase hydrochlorothiazide to 25 mg daily.  Monitor the blood pressure and if needed will add valsartan 40 mg daily to her regimen.  No anginal symptoms she only takes the Ranexa with her platelet as well as her statin.  Her blood pressure is at target in office no changes will be made.  Hyperlipidemia - continue with current statin medication.  Will get lipid profile today.  Smoking cessation advised today in the office.   The patient is in agreement with the above  plan. The patient left the office in stable condition.  The patient will follow up in     Medication Adjustments/Labs and Tests Ordered: Current medicines are reviewed at length with the patient today.  Concerns regarding medicines are outlined above.  Orders Placed This Encounter  Procedures   Lipid panel   Meds ordered this encounter  Medications   hydrochlorothiazide (HYDRODIURIL) 25 MG tablet    Sig: Take 1 tablet (25 mg total) by mouth daily.    Dispense:  90 tablet    Refill:  3    Patient Instructions  Medication Instructions:  Your physician has recommended you make the following change in your medication:  STOP: Amlodipine INCREASE: Hydrochlorothiazide 25 mg once daily  Please take your blood pressure daily for 4 weeks and send in a MyChart message. Please include heart rates.   HOW TO TAKE YOUR BLOOD PRESSURE: Rest 5 minutes before taking your blood pressure. Don't smoke or drink caffeinated beverages for at least 30 minutes before. Take your blood pressure before (not after) you eat. Sit comfortably with your back supported and both feet on the floor (don't cross your legs). Elevate your arm to heart level on a table or a desk. Use the proper sized cuff. It should fit smoothly and snugly around your bare upper arm. There should be enough room to slip a fingertip under the cuff. The bottom edge of the cuff should be 1 inch above the crease of the elbow. Ideally, take 3 measurements at one sitting and record the average.  *If you need a refill on your cardiac medications before your next appointment, please call your pharmacy*   Lab Work: Your physician recommends that you have labs drawn today: Lipids If you have labs (blood work) drawn today and your tests are completely normal, you will receive your results only by: MyChart Message (if you have MyChart) OR A paper copy in the mail If you have any lab test that is abnormal or we need to change your treatment, we will call you to review the results.   Testing/Procedures: None   Follow-Up: At  [facility], you and your health needs are our priority.  As part of our continuing mission to provide you with exceptional heart care, we have created designated Provider Care Teams.  These Care Teams include your primary Cardiologist (physician) and Advanced Practice Providers (APPs -  Physician Assistants and Nurse Practitioners) who all work together to provide you with the care you need, when you need it.  We recommend signing up for the patient portal called "MyChart".  Sign up information is provided on this After Visit Summary.  MyChart is used to connect with patients for Virtual Visits (Telemedicine).  Patients are able to view lab/test results, encounter notes, upcoming appointments, etc.  Non-urgent messages can be sent to your provider as well.   To learn more about what you can do with MyChart, go to [email or web address].    Your next appointment:   6 month(s)  Provider:   Thomasene Ripple, DO     Adopting a Healthy Lifestyle.  Know what a healthy weight is for you (roughly BMI <25) and aim to maintain this   Aim for 7+ servings of fruits and vegetables daily   65-80+ fluid ounces of water or unsweet tea for healthy kidneys   Limit to max 1 drink of alcohol per day; avoid smoking/tobacco   Limit animal fats in diet for cholesterol and heart health - choose grass fed  whenever available   Avoid highly processed foods, and foods high in saturated/trans fats   Aim for low stress - take time to unwind and care for your mental health   Aim for 150 min of moderate intensity exercise weekly for heart health, and weights twice weekly for bone health   Aim for 7-9 hours of sleep daily   When it comes to diets, agreement about the perfect plan isnt easy to find, even among the experts. Experts at the [facility] [employer] developed an idea known as the Healthy Eating Plate. Just imagine a plate divided into logical, healthy portions.   The emphasis is on diet  quality:   Load up on vegetables and fruits - one-half of your plate: Aim for color and variety, and remember that potatoes dont count.   Go for whole grains - one-quarter of your plate: Whole wheat, barley, wheat berries, quinoa, oats, brown rice, and foods made with them. If you want pasta, go with whole wheat pasta.   Protein power - one-quarter of your plate: Fish, chicken, beans, and nuts are all healthy, versatile protein sources. Limit red meat.   The diet, however, does go beyond the plate, offering a few other suggestions.   Use healthy plant oils, such as olive, canola, soy, corn, sunflower and peanut. Check the labels, and avoid partially hydrogenated oil, which have unhealthy trans fats.   If youre thirsty, drink water. Coffee and tea are good in moderation, but skip sugary drinks and limit milk and dairy products to one or two daily servings.   The type of carbohydrate in the diet is more important than the amount. Some sources of carbohydrates, such as vegetables, fruits, whole grains, and beans-are healthier than others.   Finally, stay active  Signed, Thomasene Ripple, DO  08/15/2022 11:02 PM    [facility]

## 2022-08-14 NOTE — Patient Instructions (Signed)
Medication Instructions:  Your physician has recommended you make the following change in your medication:  STOP: Amlodipine INCREASE: Hydrochlorothiazide 25 mg once daily  Please take your blood pressure daily for 4 weeks and send in a MyChart message. Please include heart rates.   HOW TO TAKE YOUR BLOOD PRESSURE: Rest 5 minutes before taking your blood pressure. Don't smoke or drink caffeinated beverages for at least 30 minutes before. Take your blood pressure before (not after) you eat. Sit comfortably with your back supported and both feet on the floor (don't cross your legs). Elevate your arm to heart level on a table or a desk. Use the proper sized cuff. It should fit smoothly and snugly around your bare upper arm. There should be enough room to slip a fingertip under the cuff. The bottom edge of the cuff should be 1 inch above the crease of the elbow. Ideally, take 3 measurements at one sitting and record the average.  *If you need a refill on your cardiac medications before your next appointment, please call your pharmacy*   Lab Work: Your physician recommends that you have labs drawn today: Lipids If you have labs (blood work) drawn today and your tests are completely normal, you will receive your results only by: MyChart Message (if you have MyChart) OR A paper copy in the mail If you have any lab test that is abnormal or we need to change your treatment, we will call you to review the results.   Testing/Procedures: None   Follow-Up: At [facility], you and your health needs are our priority.  As part of our continuing mission to provide you with exceptional heart care, we have created designated Provider Care Teams.  These Care Teams include your primary Cardiologist (physician) and Advanced Practice Providers (APPs -  Physician Assistants and Nurse Practitioners) who all work together to provide you with the care you need, when you need it.  We recommend  signing up for the patient portal called "MyChart".  Sign up information is provided on this After Visit Summary.  MyChart is used to connect with patients for Virtual Visits (Telemedicine).  Patients are able to view lab/test results, encounter notes, upcoming appointments, etc.  Non-urgent messages can be sent to your provider as well.   To learn more about what you can do with MyChart, go to [email or web address].    Your next appointment:   6 month(s)  Provider:   Thomasene Ripple, DO

## 2022-08-15 ENCOUNTER — Encounter (HOSPITAL_COMMUNITY)
Admission: RE | Admit: 2022-08-15 | Discharge: 2022-08-15 | Disposition: A | Discharge status: Home / Self Care | Payer: 59 | Source: Ambulatory Visit | Attending: Cardiology | Admitting: Cardiology

## 2022-08-15 DIAGNOSIS — I2089 Other forms of angina pectoris: Secondary | ICD-10-CM

## 2022-08-18 ENCOUNTER — Telehealth: Payer: Self-pay | Admitting: Cardiology

## 2022-08-18 ENCOUNTER — Ambulatory Visit
Admission: RE | Admit: 2022-08-18 | Discharge: 2022-08-18 | Disposition: A | Discharge status: Home / Self Care | Payer: 59 | Source: Ambulatory Visit | Attending: Family Medicine | Admitting: Family Medicine

## 2022-08-18 ENCOUNTER — Encounter (HOSPITAL_COMMUNITY): Payer: 59

## 2022-08-18 DIAGNOSIS — Z1231 Encounter for screening mammogram for malignant neoplasm of breast: Secondary | ICD-10-CM

## 2022-08-18 NOTE — Telephone Encounter (Signed)
Call to patient at number provided.  Caller states it is not [employer].  Will wait for patient to call back for information below from OV notes   Continue Plavix and resume Xarelto  Increase hydrochlorothiazide to 25 mg daily. Monitor the blood pressure and if needed will add valsartan 40 mg daily to her regimen.  Continue with current statin medication (Lipitor)

## 2022-08-18 NOTE — Telephone Encounter (Signed)
Pt c/o medication issue:  1. Name of Medication:   hydrochlorothiazide (HYDRODIURIL) 25 MG tablet    2. How are you currently taking this medication (dosage and times per day)? Take 1 tablet (25 mg total) by mouth daily.   3. Are you having a reaction (difficulty breathing--STAT)? No   4. What is your medication issue? Patient states that on her last visit she was told a different dosage then what's prescribe. Please advise    Patient states its okay to leave the dosage on her VM

## 2022-08-19 NOTE — Telephone Encounter (Signed)
Call to same number as yesterday, message states [employer]. Ask to please call office

## 2022-08-20 ENCOUNTER — Other Ambulatory Visit: Payer: Self-pay | Admitting: Family Medicine

## 2022-08-20 ENCOUNTER — Encounter (HOSPITAL_COMMUNITY)
Admission: RE | Admit: 2022-08-20 | Discharge: 2022-08-20 | Disposition: A | Discharge status: Home / Self Care | Payer: 59 | Source: Ambulatory Visit | Attending: Cardiology | Admitting: Cardiology

## 2022-08-20 DIAGNOSIS — I2089 Other forms of angina pectoris: Secondary | ICD-10-CM | POA: Diagnosis present

## 2022-08-20 DIAGNOSIS — R928 Other abnormal and inconclusive findings on diagnostic imaging of breast: Secondary | ICD-10-CM

## 2022-08-20 NOTE — Progress Notes (Signed)
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 seeing one again, encouraged to reach out to PCP. Tatjana also shared that she has had some trouble sleeping at night, she is currently taking trazadone and feels that it is helping her. She shared that she talks to her daughters and mother frequently to help her cope with her feelings, however her main source of stress is currently her 64 y/o daughter who is about to graduate highschool. She used to take medication for her depression however she did not like the way they made her feel.      Barriers   Psychosocial barriers to participate in program The patient should benefit from training in stress management and relaxation.      Screening Interventions   Interventions Encouraged to exercise;To provide support and  resources with identified psychosocial needs;Provide feedback about the scores to participant    Expected Outcomes Short Term goal: Utilizing psychosocial counselor, staff and physician to assist with identification of specific Stressors or current issues interfering with healing process. Setting desired goal for each stressor or current issue identified.;Long Term Goal: Stressors or current issues are controlled or eliminated.;Short Term goal: Identification and review with participant of any Quality of Life or Depression  concerns found by scoring the questionnaire.;Long Term goal: The participant improves quality of Life and PHQ9 Scores as seen by post scores and/or verbalization of changes             Quality of Life Scores:  Quality of Life - 06/18/22 1204       Quality of Life   Select Quality of Life      Quality of Life Scores   Health/Function Pre 20.03 %    Socioeconomic Pre 22.81 %    Psych/Spiritual Pre 24.86 %    Family Pre 21.4 %    GLOBAL Pre 21.83 %            Scores of 19 and below usually indicate a poorer quality of life in these areas.  A difference of  2-3 points is a clinically meaningful difference.  A difference of 2-3 points in the total score of the Quality of Life Index has been associated with significant improvement in overall quality of life, self-image, physical symptoms, and general health in studies assessing change in quality of life.  PHQ-9: Review Flowsheet  More data exists      06/18/2022 11/27/2017 09/23/2017 08/31/2017 08/03/2017  Depression screen PHQ 2/9  Decreased Interest 2 3 1  0 1  Down, Depressed, Hopeless 2 3 1  0 1  PHQ - 2 Score 4 6 2  0 2  Altered sleeping 3 - 1 - 1  Tired, decreased energy 1 - 1 - 1  Change in appetite 1 - 1 - 0  Feeling bad or failure about yourself  0 - 1 - 0  Trouble concentrating 0 - 1 - 0  Moving slowly or fidgety/restless 0 - - - 0  Suicidal thoughts 0 - 0 - 0  PHQ-9 Score 9 - 7 - 4  Difficult doing  work/chores Somewhat difficult - - - Not difficult at all   Interpretation of Total Score  Total Score Depression Severity:  1-4 = Minimal depression, 5-9 = Mild depression, 10-14 = Moderate depression, 15-19 = Moderately severe depression, 20-27 = Severe depression   Psychosocial Evaluation and Intervention:   Psychosocial Re-Evaluation:  Psychosocial Re-Evaluation     Row Name 06/23/22 1650 07/23/22 1735 08/20/22 1436         Psychosocial Re-Evaluation   Current issues with Current Stress Concerns;History of Depression;Current Depression;Current Anxiety/Panic Current Stress Concerns;History of Depression;Current Depression;Current Anxiety/Panic Current Stress Concerns;History of Depression;Current Depression;Current Anxiety/Panic     Comments Lindsey [name] started intensive cardiac rehab on 06/23/22 will review depressin screening and quality of life in the upcoming week Reviewed quality of life. Dr Gwendel Hanson office was contacted as the patient is interested in counselling. Dr Delia Chimes called the patient and will set up appointment for patient in the near future Lindsey [name] says her daughter is doing better in school she graduates from high school this week and is going to college.     Expected Outcomes Lindsey [name] will have decreased or controlled depression upon completion of intensive cardiac rehab Lindsey [name] will have decreased or controlled depression upon completion of intensive cardiac rehab Lindsey [name] will have decreased or controlled depression upon completion of intensive cardiac rehab     Interventions Stress management education;Relaxation education;Encouraged to attend Cardiac Rehabilitation for the exercise Stress management education;Relaxation education;Encouraged to attend Cardiac Rehabilitation for the exercise Stress management education;Relaxation education;Encouraged to attend Cardiac Rehabilitation for the exercise     Continue Psychosocial Services  Follow up required by staff Follow up  required by staff No Follow up required  Initial Review   Source of Stress Concerns Chronic Illness Chronic Illness Chronic Illness     Comments Will continue to montior and offer support as needed Will continue to montior and offer support as needed Will continue to montior and offer support as needed              Psychosocial Discharge (Final Psychosocial Re-Evaluation):  Psychosocial Re-Evaluation - 08/20/22 1436       Psychosocial Re-Evaluation   Current issues with Current Stress Concerns;History of Depression;Current Depression;Current Anxiety/Panic    Comments Lindsey [name] says her daughter is doing better in school she graduates from high school this week and is going to college.    Expected Outcomes Lindsey [name] will have decreased or controlled depression upon completion of intensive cardiac rehab    Interventions Stress management education;Relaxation education;Encouraged to attend Cardiac Rehabilitation for the exercise    Continue Psychosocial Services  No Follow up required      Initial Review   Source of Stress Concerns Chronic Illness    Comments Will continue to montior and offer support as needed             Vocational Rehabilitation: Provide vocational rehab assistance to qualifying candidates.   Vocational Rehab Evaluation & Intervention:  Vocational Rehab - 06/18/22 1205       Initial Vocational Rehab Evaluation & Intervention   Assessment shows need for Vocational Rehabilitation No   Allise is on disability and not interested in working            Education: Education Goals: Education classes will be provided on a weekly basis, covering required topics. Participant will state understanding/return demonstration of topics presented.    Education     Row Name 06/23/22 1500     Education   Cardiac Education Topics Pritikin   Psychologist, counselling   Select Nutrition   Nutrition Nutrition Action Plan    Instruction Review Code 1- Verbalizes Understanding   Class Start Time 1405   Class Stop Time 1440   Class Time Calculation (min) 35 min    Row Name 06/25/22 1600     Education   Cardiac Education Topics Pritikin   Customer service manager   Weekly Topic Efficiency Cooking - Meals in a Snap   Instruction Review Code 1- Verbalizes Understanding   Class Start Time 1400   Class Stop Time 1445   Class Time Calculation (min) 45 min    Row Name 06/27/22 1400     Education   Cardiac Education Topics Pritikin   Psychologist, forensic Exercise Education   Exercise Education Move It!   Instruction Review Code 1- Verbalizes Understanding   Class Start Time 1359   Class Stop Time 1433   Class Time Calculation (min) 34 min    Row Name 06/30/22 1600     Education   Cardiac Education Topics Pritikin   Select Workshops     Workshops   Educator Exercise Physiologist   Select Psychosocial   Psychosocial Workshop Focused Goals, Sustainable Changes   Instruction Review Code 1- Verbalizes Understanding   Class Start Time 1400   Class Stop Time 1441   Class Time Calculation (min) 41 min    Row Name 07/02/22 1500     Education   Cardiac Education Topics Pritikin  Geographical information systems officer   Instruction Review Code 1- Verbalizes Understanding   Class Start Time 1400   Class Stop Time 1440   Class Time Calculation (min) 40 min    Row Name 07/04/22 1500     Education   Cardiac Education Topics Pritikin   Psychologist, forensic General Education   General Education Hypertension and Heart Disease   Instruction Review Code 1- Verbalizes Understanding   Class Start Time 1410   Class Stop Time 1455   Class Time Calculation (min) 45 min    Row Name 07/07/22 1400      Education   Cardiac Education Topics Pritikin   Psychologist, forensic Exercise Education   Exercise Education Biomechanial Limitations   Instruction Review Code 1- Verbalizes Understanding   Class Start Time 1407   Class Stop Time 1444   Class Time Calculation (min) 37 min    Row Name 07/09/22 1600     Education   Cardiac Education Topics Pritikin   Customer service manager   Weekly Topic Comforting Weekend Breakfasts   Instruction Review Code 1- Verbalizes Understanding   Class Start Time 1355   Class Stop Time 1442   Class Time Calculation (min) 47 min    Row Name 07/11/22 1500     Education   Cardiac Education Topics Pritikin   Licensed conveyancer Nutrition   Nutrition Dining Out - Part 1   Instruction Review Code 1- Verbalizes Understanding   Class Start Time 1400   Class Stop Time 1445   Class Time Calculation (min) 45 min    Row Name 07/14/22 1500     Education   Cardiac Education Topics Pritikin   Licensed conveyancer Nutrition   Nutrition Facts on Fat   Instruction Review Code 1- Verbalizes Understanding   Class Start Time 1400   Class Stop Time 1439   Class Time Calculation (min) 39 min    Row Name 07/16/22 1500     Education   Cardiac Education Topics Pritikin   Customer service manager   Weekly Topic Fast Evening Meals   Instruction Review Code 1- Verbalizes Understanding   Class Start Time 1400   Class Stop Time 1440   Class Time Calculation (min) 40 min    Row Name 07/21/22 1700     Education   Cardiac Education Topics Pritikin   [employer]     Workshops   Educator Exercise Physiologist   Select Psychosocial   Psychosocial Workshop Healthy Sleep for a Healthy Heart   Instruction Review Code 1-  Verbalizes Understanding   Class Start Time 1405   Class Stop Time 1502   Class Time Calculation (min) 57 min    Row Name 07/23/22 1500     Education   Cardiac Education Topics Pritikin   Customer service manager   Weekly Topic International Cuisine- Spotlight on the [facility] Zones   Instruction Review Code  1- Verbalizes Understanding   Class Start Time 1355   Class Stop Time 1430   Class Time Calculation (min) 35 min    Row Name 07/28/22 1600     Education   Cardiac Education Topics Pritikin   Education officer, community Nutrition   Nutrition Workshop Fueling a Forensic psychologist   Instruction Review Code 1- Tax inspector   Class Start Time 1400   Class Stop Time 1453   Class Time Calculation (min) 53 min    Row Name 07/30/22 1500     Education   Cardiac Education Topics Pritikin   Customer service manager   Weekly Topic Simple Sides and Sauces   Instruction Review Code 1- Verbalizes Understanding   Class Start Time 1350   Class Stop Time 1425   Class Time Calculation (min) 35 min    Row Name 08/01/22 1500     Education   Cardiac Education Topics Pritikin   Nurse, children's Exercise Physiologist   Select Psychosocial   Psychosocial How Our Thoughts Can Heal Our Hearts   Instruction Review Code 1- Verbalizes Understanding   Class Start Time 1400   Class Stop Time 1445   Class Time Calculation (min) 45 min    Row Name 08/04/22 1600     Education   Cardiac Education Topics Pritikin   Geographical information systems officer Psychosocial   Psychosocial Workshop From Head to Heart: The Power of a Healthy Outlook   Instruction Review Code 1- Verbalizes Understanding   Class Start Time 1405   Class Stop Time 1502   Class Time Calculation  (min) 57 min    Row Name 08/15/22 1600     Education   Cardiac Education Topics Pritikin   Engineer, mining Education   General Education Heart Disease Risk Reduction   Instruction Review Code 1- Verbalizes Understanding   Class Start Time 1405   Class Stop Time 1450   Class Time Calculation (min) 45 min    Row Name 08/20/22 1500     Education   Cardiac Education Topics Pritikin   Customer service manager   Weekly Topic Fast and Healthy Breakfasts   Instruction Review Code 1- Verbalizes Understanding   Class Start Time 1400   Class Stop Time 1440   Class Time Calculation (min) 40 min            Core Videos: Exercise    Move It!  Clinical staff conducted group or individual video education with verbal and written material and guidebook.  Patient learns the recommended Pritikin exercise program. Exercise with the goal of living a long, healthy life. Some of the health benefits of exercise include controlled diabetes, healthier blood pressure levels, improved cholesterol levels, improved heart and lung capacity, improved sleep, and better body composition. Everyone should speak with their doctor before starting or changing an exercise routine.  Biomechanical Limitations Clinical staff conducted group or individual video education with verbal and written material and guidebook.  Patient learns how biomechanical limitations can impact exercise and how we can mitigate and possibly overcome  limitations to have an impactful and balanced exercise routine.  Body Composition Clinical staff conducted group or individual video education with verbal and written material and guidebook.  Patient learns that body composition (ratio of muscle mass to fat mass) is a key component to assessing overall fitness, rather than body weight alone. Increased fat mass, especially visceral belly fat, can put  [place] at increased risk for metabolic syndrome, type 2 diabetes, heart disease, and even death. It is recommended to combine diet and exercise (cardiovascular and resistance training) to improve your body composition. Seek guidance from your physician and exercise physiologist before implementing an exercise routine.  Exercise Action Plan Clinical staff conducted group or individual video education with verbal and written material and guidebook.  Patient learns the recommended strategies to achieve and enjoy long-term exercise adherence, including variety, self-motivation, self-efficacy, and positive decision making. Benefits of exercise include fitness, good health, weight management, more energy, better sleep, less stress, and overall well-being.  Medical   Heart Disease Risk Reduction Clinical staff conducted group or individual video education with verbal and written material and guidebook.  Patient learns our heart is our most vital organ as it circulates oxygen, nutrients, [place] blood cells, and hormones throughout the entire body, and carries waste away. Data supports a plant-based eating plan like the Pritikin Program for its effectiveness in slowing progression of and reversing heart disease. The video provides a number of recommendations to address heart disease.   Metabolic Syndrome and Belly Fat  Clinical staff conducted group or individual video education with verbal and written material and guidebook.  Patient learns what metabolic syndrome is, how it leads to heart disease, and how one can reverse it and keep it from coming back. You have metabolic syndrome if you have 3 of the following 5 criteria: abdominal obesity, high blood pressure, high triglycerides, low HDL cholesterol, and high blood sugar.  Hypertension and Heart Disease Clinical staff conducted group or individual video education with verbal and written material and guidebook.  Patient learns that high blood pressure, or  hypertension, is very common in the [place]. Hypertension is largely due to excessive salt intake, but other important risk factors include being overweight, physical inactivity, drinking too much alcohol, smoking, and not eating enough potassium from fruits and vegetables. High blood pressure is a leading risk factor for heart attack, stroke, congestive heart failure, dementia, kidney failure, and premature death. Long-term effects of excessive salt intake include stiffening of the arteries and thickening of heart muscle and organ damage. Recommendations include ways to reduce hypertension and the risk of heart disease.  Diseases of Our Time - Focusing on Diabetes Clinical staff conducted group or individual video education with verbal and written material and guidebook.  Patient learns why the best way to stop diseases of our time is prevention, through food and other lifestyle changes. Medicine (such as prescription pills and surgeries) is often only a Band-Aid on the problem, not a long-term solution. Most common diseases of our time include obesity, type 2 diabetes, hypertension, heart disease, and cancer. The Pritikin Program is recommended and has been proven to help reduce, reverse, and/or prevent the damaging effects of metabolic syndrome.  Nutrition   Overview of the Pritikin Eating Plan  Clinical staff conducted group or individual video education with verbal and written material and guidebook.  Patient learns about the Pritikin Eating Plan for disease risk reduction. The Pritikin Eating Plan emphasizes a wide variety of unrefined, minimally-processed carbohydrates, like fruits, vegetables, whole  grains, and legumes. Go, Caution, and Stop food choices are explained. Plant-based and lean animal proteins are emphasized. Rationale provided for low sodium intake for blood pressure control, low added sugars for blood sugar stabilization, and low added fats and oils for coronary artery disease  risk reduction and weight management.  Calorie Density  Clinical staff conducted group or individual video education with verbal and written material and guidebook.  Patient learns about calorie density and how it impacts the Pritikin Eating Plan. Knowing the characteristics of the food you choose will help you decide whether those foods will lead to weight gain or weight loss, and whether you want to consume more or less of them. Weight loss is usually a side effect of the Pritikin Eating Plan because of its focus on low calorie-dense foods.  Label Reading  Clinical staff conducted group or individual video education with verbal and written material and guidebook.  Patient learns about the Pritikin recommended label reading guidelines and corresponding recommendations regarding calorie density, added sugars, sodium content, and whole grains.  Dining Out - Part 1  Clinical staff conducted group or individual video education with verbal and written material and guidebook.  Patient learns that restaurant meals can be sabotaging because they can be so high in calories, fat, sodium, and/or sugar. Patient learns recommended strategies on how to positively address this and avoid unhealthy pitfalls.  Facts on Fats  Clinical staff conducted group or individual video education with verbal and written material and guidebook.  Patient learns that lifestyle modifications can be just as effective, if not more so, as many medications for lowering your risk of heart disease. A Pritikin lifestyle can help to reduce your risk of inflammation and atherosclerosis (cholesterol build-up, or plaque, in the artery walls). Lifestyle interventions such as dietary choices and physical activity address the cause of atherosclerosis. A review of the types of fats and their impact on blood cholesterol levels, along with dietary recommendations to reduce fat intake is also included.  Nutrition Action Plan  Clinical staff  conducted group or individual video education with verbal and written material and guidebook.  Patient learns how to incorporate Pritikin recommendations into their lifestyle. Recommendations include planning and keeping personal health goals in mind as an important part of their success.  Healthy Mind-Set    Healthy Minds, Bodies, Hearts  Clinical staff conducted group or individual video education with verbal and written material and guidebook.  Patient learns how to identify when they are stressed. Video will discuss the impact of that stress, as well as the many benefits of stress management. Patient will also be introduced to stress management techniques. The way we think, act, and feel has an impact on our hearts.  How Our Thoughts Can Heal Our Hearts  Clinical staff conducted group or individual video education with verbal and written material and guidebook.  Patient learns that negative thoughts can cause depression and anxiety. This can result in negative lifestyle behavior and serious health problems. Cognitive behavioral therapy is an effective method to help control our thoughts in order to change and improve our emotional outlook.  Additional Videos:  Exercise    Improving Performance  Clinical staff conducted group or individual video education with verbal and written material and guidebook.  Patient learns to use a non-linear approach by alternating intensity levels and lengths of time spent exercising to help burn more calories and lose more body fat. Cardiovascular exercise helps improve heart health, metabolism, hormonal balance, blood sugar control, and  recovery from fatigue. Resistance training improves strength, endurance, balance, coordination, reaction time, metabolism, and muscle mass. Flexibility exercise improves circulation, posture, and balance. Seek guidance from your physician and exercise physiologist before implementing an exercise routine and learn your capabilities  and proper form for all exercise.  Introduction to Yoga  Clinical staff conducted group or individual video education with verbal and written material and guidebook.  Patient learns about yoga, a discipline of the coming together of mind, breath, and body. The benefits of yoga include improved flexibility, improved range of motion, better posture and core strength, increased lung function, weight loss, and positive self-image. Yoga's heart health benefits include lowered blood pressure, healthier heart rate, decreased cholesterol and triglyceride levels, improved immune function, and reduced stress. Seek guidance from your physician and exercise physiologist before implementing an exercise routine and learn your capabilities and proper form for all exercise.  Medical   Aging: Enhancing Your Quality of Life  Clinical staff conducted group or individual video education with verbal and written material and guidebook.  Patient learns key strategies and recommendations to stay in good physical health and enhance quality of life, such as prevention strategies, having an advocate, securing a Health Care Proxy and Power of Attorney, and keeping a list of medications and system for tracking them. It also discusses how to avoid risk for bone loss.  Biology of Weight Control  Clinical staff conducted group or individual video education with verbal and written material and guidebook.  Patient learns that weight gain occurs because we consume more calories than we burn (eating more, moving less). Even if your body weight is normal, you may have higher ratios of fat compared to muscle mass. Too much body fat puts you at increased risk for cardiovascular disease, heart attack, stroke, type 2 diabetes, and obesity-related cancers. In addition to exercise, following the Pritikin Eating Plan can help reduce your risk.  Decoding Lab Results  Clinical staff conducted group or individual video education with verbal and  written material and guidebook.  Patient learns that lab test reflects one measurement whose values change over time and are influenced by many factors, including medication, stress, sleep, exercise, food, hydration, pre-existing medical conditions, and more. It is recommended to use the knowledge from this video to become more involved with your lab results and evaluate your numbers to speak with your doctor.   Diseases of Our Time - Overview  Clinical staff conducted group or individual video education with verbal and written material and guidebook.  Patient learns that according to the CDC, 50% to 70% of chronic diseases (such as obesity, type 2 diabetes, elevated lipids, hypertension, and heart disease) are avoidable through lifestyle improvements including healthier food choices, listening to satiety cues, and increased physical activity.  Sleep Disorders Clinical staff conducted group or individual video education with verbal and written material and guidebook.  Patient learns how good quality and duration of sleep are important to overall health and well-being. Patient also learns about sleep disorders and how they impact health along with recommendations to address them, including discussing with a physician.  Nutrition  Dining Out - Part 2 Clinical staff conducted group or individual video education with verbal and written material and guidebook.  Patient learns how to plan ahead and communicate in order to maximize their dining experience in a healthy and nutritious manner. Included are recommended food choices based on the type of restaurant the patient is visiting.   Fueling a Banker conducted  group or individual video education with verbal and written material and guidebook.  There is a strong connection between our food choices and our health. Diseases like obesity and type 2 diabetes are very prevalent and are in large-part due to lifestyle choices. The  Pritikin Eating Plan provides plenty of food and hunger-curbing satisfaction. It is easy to follow, affordable, and helps reduce health risks.  Menu Workshop  Clinical staff conducted group or individual video education with verbal and written material and guidebook.  Patient learns that restaurant meals can sabotage health goals because they are often packed with calories, fat, sodium, and sugar. Recommendations include strategies to plan ahead and to communicate with the manager, chef, or server to help order a healthier meal.  Planning Your Eating Strategy  Clinical staff conducted group or individual video education with verbal and written material and guidebook.  Patient learns about the Pritikin Eating Plan and its benefit of reducing the risk of disease. The Pritikin Eating Plan does not focus on calories. Instead, it emphasizes high-quality, nutrient-rich foods. By knowing the characteristics of the foods, we choose, we can determine their calorie density and make informed decisions.  Targeting Your Nutrition Priorities  Clinical staff conducted group or individual video education with verbal and written material and guidebook.  Patient learns that lifestyle habits have a tremendous impact on disease risk and progression. This video provides eating and physical activity recommendations based on your personal health goals, such as reducing LDL cholesterol, losing weight, preventing or controlling type 2 diabetes, and reducing high blood pressure.  Vitamins and Minerals  Clinical staff conducted group or individual video education with verbal and written material and guidebook.  Patient learns different ways to obtain key vitamins and minerals, including through a recommended healthy diet. It is important to discuss all supplements you take with your doctor.   Healthy Mind-Set    Smoking Cessation  Clinical staff conducted group or individual video education with verbal and written  material and guidebook.  Patient learns that cigarette smoking and tobacco addiction pose a serious health risk which affects millions of people. Stopping smoking will significantly reduce the risk of heart disease, lung disease, and many forms of cancer. Recommended strategies for quitting are covered, including working with your doctor to develop a successful plan.  Culinary   Becoming a Set designer conducted group or individual video education with verbal and written material and guidebook.  Patient learns that cooking at home can be healthy, cost-effective, quick, and puts them in control. Keys to cooking healthy recipes will include looking at your recipe, assessing your equipment needs, planning ahead, making it simple, choosing cost-effective seasonal ingredients, and limiting the use of added fats, salts, and sugars.  Cooking - Breakfast and Snacks  Clinical staff conducted group or individual video education with verbal and written material and guidebook.  Patient learns how important breakfast is to satiety and nutrition through the entire day. Recommendations include key foods to eat during breakfast to help stabilize blood sugar levels and to prevent overeating at meals later in the day. Planning ahead is also a key component.  Cooking - Educational psychologist conducted group or individual video education with verbal and written material and guidebook.  Patient learns eating strategies to improve overall health, including an approach to cook more at home. Recommendations include thinking of animal protein as a side on your plate rather than center stage and focusing instead on lower calorie dense options  like vegetables, fruits, whole grains, and plant-based proteins, such as beans. Making sauces in large quantities to freeze for later and leaving the skin on your vegetables are also recommended to maximize your experience.  Cooking - Healthy Salads and  Dressing Clinical staff conducted group or individual video education with verbal and written material and guidebook.  Patient learns that vegetables, fruits, whole grains, and legumes are the foundations of the Pritikin Eating Plan. Recommendations include how to incorporate each of these in flavorful and healthy salads, and how to create homemade salad dressings. Proper handling of ingredients is also covered. Cooking - Soups and [employer] - Soups and Desserts Clinical staff conducted group or individual video education with verbal and written material and guidebook.  Patient learns that Pritikin soups and desserts make for easy, nutritious, and delicious snacks and meal components that are low in sodium, fat, sugar, and calorie density, while high in vitamins, minerals, and filling fiber. Recommendations include simple and healthy ideas for soups and desserts.   Overview     The Pritikin Solution Program Overview Clinical staff conducted group or individual video education with verbal and written material and guidebook.  Patient learns that the results of the Pritikin Program have been documented in more than 100 articles published in peer-reviewed journals, and the benefits include reducing risk factors for (and, in some cases, even reversing) high cholesterol, high blood pressure, type 2 diabetes, obesity, and more! An overview of the three key pillars of the Pritikin Program will be covered: eating well, doing regular exercise, and having a healthy mind-set.  WORKSHOPS  Exercise: Exercise Basics: Building Your Action Plan Clinical staff led group instruction and group discussion with PowerPoint presentation and patient guidebook. To enhance the learning environment the use of posters, models and videos may be added. At the conclusion of this workshop, patients will comprehend the difference between physical activity and exercise, as well as the benefits of incorporating both, into  their routine. Patients will understand the FITT (Frequency, Intensity, Time, and Type) principle and how to use it to build an exercise action plan. In addition, safety concerns and other considerations for exercise and cardiac rehab will be addressed by the presenter. The purpose of this lesson is to promote a comprehensive and effective weekly exercise routine in order to improve patients' overall level of fitness.   Managing Heart Disease: Your Path to a Healthier Heart Clinical staff led group instruction and group discussion with PowerPoint presentation and patient guidebook. To enhance the learning environment the use of posters, models and videos may be added.At the conclusion of this workshop, patients will understand the anatomy and physiology of the heart. Additionally, they will understand how Pritikin's three pillars impact the risk factors, the progression, and the management of heart disease.  The purpose of this lesson is to provide a high-level overview of the heart, heart disease, and how the Pritikin lifestyle positively impacts risk factors.  Exercise Biomechanics Clinical staff led group instruction and group discussion with PowerPoint presentation and patient guidebook. To enhance the learning environment the use of posters, models and videos may be added. Patients will learn how the structural parts of their bodies function and how these functions impact their daily activities, movement, and exercise. Patients will learn how to promote a neutral spine, learn how to manage pain, and identify ways to improve their physical movement in order to promote healthy living. The purpose of this lesson is to expose patients to common physical limitations that  impact physical activity. Participants will learn practical ways to adapt and manage aches and pains, and to minimize their effect on regular exercise. Patients will learn how to maintain good posture while sitting, walking, and  lifting.  Balance Training and Fall Prevention  Clinical staff led group instruction and group discussion with PowerPoint presentation and patient guidebook. To enhance the learning environment the use of posters, models and videos may be added. At the conclusion of this workshop, patients will understand the importance of their sensorimotor skills (vision, proprioception, and the vestibular system) in maintaining their ability to balance as they age. Patients will apply a variety of balancing exercises that are appropriate for their current level of function. Patients will understand the common causes for poor balance, possible solutions to these problems, and ways to modify their physical environment in order to minimize their fall risk. The purpose of this lesson is to teach patients about the importance of maintaining balance as they age and ways to minimize their risk of falling.  WORKSHOPS   Nutrition:  Fueling a Ship broker led group instruction and group discussion with PowerPoint presentation and patient guidebook. To enhance the learning environment the use of posters, models and videos may be added. Patients will review the foundational principles of the Pritikin Eating Plan and understand what constitutes a serving size in each of the food groups. Patients will also learn Pritikin-friendly foods that are better choices when away from home and review make-ahead meal and snack options. Calorie density will be reviewed and applied to three nutrition priorities: weight maintenance, weight loss, and weight gain. The purpose of this lesson is to reinforce (in a group setting) the key concepts around what patients are recommended to eat and how to apply these guidelines when away from home by planning and selecting Pritikin-friendly options. Patients will understand how calorie density may be adjusted for different weight management goals.  Mindful Eating  Clinical staff led  group instruction and group discussion with PowerPoint presentation and patient guidebook. To enhance the learning environment the use of posters, models and videos may be added. Patients will briefly review the concepts of the Pritikin Eating Plan and the importance of low-calorie dense foods. The concept of mindful eating will be introduced as well as the importance of paying attention to internal hunger signals. Triggers for non-hunger eating and techniques for dealing with triggers will be explored. The purpose of this lesson is to provide patients with the opportunity to review the basic principles of the Pritikin Eating Plan, discuss the value of eating mindfully and how to measure internal cues of hunger and fullness using the Hunger Scale. Patients will also discuss reasons for non-hunger eating and learn strategies to use for controlling emotional eating.  Targeting Your Nutrition Priorities Clinical staff led group instruction and group discussion with PowerPoint presentation and patient guidebook. To enhance the learning environment the use of posters, models and videos may be added. Patients will learn how to determine their genetic susceptibility to disease by reviewing their family history. Patients will gain insight into the importance of diet as part of an overall healthy lifestyle in mitigating the impact of genetics and other environmental insults. The purpose of this lesson is to provide patients with the opportunity to assess their personal nutrition priorities by looking at their family history, their own health history and current risk factors. Patients will also be able to discuss ways of prioritizing and modifying the Pritikin Eating Plan for their highest risk  areas  Menu  Clinical staff led group instruction and group discussion with PowerPoint presentation and patient guidebook. To enhance the learning environment the use of posters, models and videos may be added. Using menus  brought in from [employer], or printed from [employer], patients will apply the Pritikin dining out guidelines that were presented in the [employer] video. Patients will also be able to practice these guidelines in a variety of provided scenarios. The purpose of this lesson is to provide patients with the opportunity to practice hands-on learning of the Pritikin Dining Out guidelines with actual menus and practice scenarios.  Label Reading Clinical staff led group instruction and group discussion with PowerPoint presentation and patient guidebook. To enhance the learning environment the use of posters, models and videos may be added. Patients will review and discuss the Pritikin label reading guidelines presented in Pritikin's Label Reading Educational series video. Using fool labels brought in from local grocery stores and markets, patients will apply the label reading guidelines and determine if the packaged food meet the Pritikin guidelines. The purpose of this lesson is to provide patients with the opportunity to review, discuss, and practice hands-on learning of the Pritikin Label Reading guidelines with actual packaged food labels. Cooking School  Pritikin's [employer] are designed to teach patients ways to prepare quick, simple, and affordable recipes at home. The importance of nutrition's role in chronic disease risk reduction is reflected in its emphasis in the overall Pritikin program. By learning how to prepare essential core Pritikin Eating Plan recipes, patients will increase control over what they eat; be able to customize the flavor of foods without the use of added salt, sugar, or fat; and improve the quality of the food they consume. By learning a set of core recipes which are easily assembled, quickly prepared, and affordable, patients are more likely to prepare more healthy foods at home. These workshops focus on convenient breakfasts, simple  entres, side dishes, and desserts which can be prepared with minimal effort and are consistent with nutrition recommendations for cardiovascular risk reduction. Cooking [employer] are taught by a Armed forces logistics/support/administrative officer (RD) who has been trained by the [employer]. The chef or RD has a clear understanding of the importance of minimizing - if not completely eliminating - added fat, sugar, and sodium in recipes. Throughout the series of Cooking School Workshop sessions, patients will learn about healthy ingredients and efficient methods of cooking to build confidence in their capability to prepare    Cooking School weekly topics:  Adding Flavor- Sodium-Free  Fast and Healthy Breakfasts  Powerhouse Plant-Based Proteins  Satisfying Salads and Dressings  Simple Sides and Sauces  International Cuisine-Spotlight on the [employer] Zones  Delicious Desserts  Savory Soups  [employer] - Meals in a Astronomer Appetizers and Snacks  Comforting Weekend Breakfasts  One-Pot Wonders   Fast Evening Meals  Landscape architect Your Pritikin Plate  WORKSHOPS   Healthy Mindset (Psychosocial):  Focused Goals, Sustainable Changes Clinical staff led group instruction and group discussion with PowerPoint presentation and patient guidebook. To enhance the learning environment the use of posters, models and videos may be added. Patients will be able to apply effective goal setting strategies to establish at least one personal goal, and then take consistent, meaningful action toward that goal. They will learn to identify common barriers to achieving personal goals and develop strategies to overcome them. Patients will also gain an understanding of  how our mind-set can impact our ability to achieve goals and the importance of cultivating a positive and growth-oriented mind-set. The purpose of this lesson is to provide patients with a deeper understanding of how to set and  achieve personal goals, as well as the tools and strategies needed to overcome common obstacles which may arise along the way.  From Head to Heart: The Power of a Healthy Outlook  Clinical staff led group instruction and group discussion with PowerPoint presentation and patient guidebook. To enhance the learning environment the use of posters, models and videos may be added. Patients will be able to recognize and describe the impact of emotions and mood on physical health. They will discover the importance of self-care and explore self-care practices which may work for them. Patients will also learn how to utilize the 4 C's to cultivate a healthier outlook and better manage stress and challenges. The purpose of this lesson is to demonstrate to patients how a healthy outlook is an essential part of maintaining good health, especially as they continue their cardiac rehab journey.  Healthy Sleep for a Healthy Heart Clinical staff led group instruction and group discussion with PowerPoint presentation and patient guidebook. To enhance the learning environment the use of posters, models and videos may be added. At the conclusion of this workshop, patients will be able to demonstrate knowledge of the importance of sleep to overall health, well-being, and quality of life. They will understand the symptoms of, and treatments for, common sleep disorders. Patients will also be able to identify daytime and nighttime behaviors which impact sleep, and they will be able to apply these tools to help manage sleep-related challenges. The purpose of this lesson is to provide patients with a general overview of sleep and outline the importance of quality sleep. Patients will learn about a few of the most common sleep disorders. Patients will also be introduced to the concept of "sleep hygiene," and discover ways to self-manage certain sleeping problems through simple daily behavior changes. Finally, the workshop will motivate  patients by clarifying the links between quality sleep and their goals of heart-healthy living.   Recognizing and Reducing Stress Clinical staff led group instruction and group discussion with PowerPoint presentation and patient guidebook. To enhance the learning environment the use of posters, models and videos may be added. At the conclusion of this workshop, patients will be able to understand the types of stress reactions, differentiate between acute and chronic stress, and recognize the impact that chronic stress has on their health. They will also be able to apply different coping mechanisms, such as reframing negative self-talk. Patients will have the opportunity to practice a variety of stress management techniques, such as deep abdominal breathing, progressive muscle relaxation, and/or guided imagery.  The purpose of this lesson is to educate patients on the role of stress in their lives and to provide healthy techniques for coping with it.  Learning Barriers/Preferences:  Learning Barriers/Preferences - 06/18/22 1205       Learning Barriers/Preferences   Learning Barriers Sight   wears glasses   Learning Preferences Audio;Computer/Internet;Group Instruction;Individual Instruction;Skilled Demonstration;Verbal Instruction;Video;Written Material;Pictoral             Education Topics:  Knowledge Questionnaire Score:  Knowledge Questionnaire Score - 06/18/22 1205       Knowledge Questionnaire Score   Pre Score 20/24             Core Components/Risk Factors/Patient Goals at Admission:  Personal Goals and Risk Factors at  Admission - 06/18/22 1206       Core Components/Risk Factors/Patient Goals on Admission    Weight Management Yes;Obesity;Weight Loss    Intervention Obesity: Provide education and appropriate resources to help participant work on and attain dietary goals.;Weight Management/Obesity: Establish reasonable short term and long term weight goals.;Weight  Management: Provide education and appropriate resources to help participant work on and attain dietary goals.;Weight Management: Develop a combined nutrition and exercise program designed to reach desired caloric intake, while maintaining appropriate intake of nutrient and fiber, sodium and fats, and appropriate energy expenditure required for the weight goal.    Admit Weight 196 lb 13.9 oz (89.3 kg)    Goal Weight: Long Term 150 lb (68 kg)    Expected Outcomes Short Term: Continue to assess and modify interventions until short term weight is achieved;Long Term: Adherence to nutrition and physical activity/exercise program aimed toward attainment of established weight goal;Weight Loss: Understanding of general recommendations for a balanced deficit meal plan, which promotes 1-2 lb weight loss per week and includes a negative energy balance of [phone] kcal/d;Understanding recommendations for meals to include 15-35% energy as protein, 25-35% energy from fat, 35-60% energy from carbohydrates, less than 200mg  of dietary cholesterol, 20-35 gm of total fiber daily;Understanding of distribution of calorie intake throughout the day with the consumption of 4-5 meals/snacks    Hypertension Yes    Intervention Provide education on lifestyle modifcations including regular physical activity/exercise, weight management, moderate sodium restriction and increased consumption of fresh fruit, vegetables, and low fat dairy, alcohol moderation, and smoking cessation.;Monitor prescription use compliance.    Expected Outcomes Short Term: Continued assessment and intervention until BP is < 140/29mm HG in hypertensive participants. < 130/46mm HG in hypertensive participants with diabetes, heart failure or chronic kidney disease.;Long Term: Maintenance of blood pressure at goal levels.    Lipids Yes    Intervention Provide education and support for participant on nutrition & aerobic/resistive exercise along with prescribed  medications to achieve LDL 70mg , HDL >40mg .    Expected Outcomes Short Term: Participant states understanding of desired cholesterol values and is compliant with medications prescribed. Participant is following exercise prescription and nutrition guidelines.;Long Term: Cholesterol controlled with medications as prescribed, with individualized exercise RX and with personalized nutrition plan. Value goals: LDL < 70mg , HDL > 40 mg.    Stress Yes    Intervention Offer individual and/or small group education and counseling on adjustment to heart disease, stress management and health-related lifestyle change. Teach and support self-help strategies.;Refer participants experiencing significant psychosocial distress to appropriate mental health specialists for further evaluation and treatment. When possible, include family members and significant others in education/counseling sessions.    Expected Outcomes Short Term: Participant demonstrates changes in health-related behavior, relaxation and other stress management skills, ability to obtain effective social support, and compliance with psychotropic medications if prescribed.;Long Term: Emotional wellbeing is indicated by absence of clinically significant psychosocial distress or social isolation.             Core Components/Risk Factors/Patient Goals Review:   Goals and Risk Factor Review     Row Name 06/23/22 1701 06/27/22 1714 07/23/22 1738 08/20/22 1627       Core Components/Risk Factors/Patient Goals Review   Personal Goals Review Weight Management/Obesity;Hypertension;Lipids;Stress Weight Management/Obesity;Hypertension;Lipids;Stress Weight Management/Obesity;Hypertension;Lipids;Stress Weight Management/Obesity;Hypertension;Lipids;Stress    Review Tamirah started intensive caridac rehab on 06/23/22 and did well with exercise. Vital signs were stable Zela started intensive caridac rehab on 06/23/22 and is off to a good start to  exercise. Vital  signs were stable. Reports left ankle swelling Dr Mallory Shirk office called and notified Lindsey [name] is doing well with exercise at intensive . Vital signs have been  stable. Lindsey [name] is doing well with exercise at intensive . Vital signs have been  stable. Lindsey [name] will complete intensive cardiac rehab on 09/03/22    Expected Outcomes Lindsey [name] will continue to participate in intensive cardiac rehab for exercise, nutrition and lifestyle modifications Lindsey [name] will continue to participate in intensive cardiac rehab for exercise, nutrition and lifestyle modifications Lindsey [name] will continue to participate in intensive cardiac rehab for exercise, nutrition and lifestyle modifications Lindsey [name] will continue to participate in intensive cardiac rehab for exercise, nutrition and lifestyle modifications             Core Components/Risk Factors/Patient Goals at Discharge (Final Review):   Goals and Risk Factor Review - 08/20/22 1627       Core Components/Risk Factors/Patient Goals Review   Personal Goals Review Weight Management/Obesity;Hypertension;Lipids;Stress    Review Lindsey [name] is doing well with exercise at intensive . Vital signs have been  stable. Lindsey [name] will complete intensive cardiac rehab on 09/03/22    Expected Outcomes Lindsey [name] will continue to participate in intensive cardiac rehab for exercise, nutrition and lifestyle modifications             ITP Comments:  ITP Comments     Row Name 06/18/22 1023 06/23/22 1649 07/23/22 1734 08/20/22 1435     ITP Comments Dr. Armanda Magic medical director. Introduction to [facility] education program/ intensive cardiac rehab. Initial orientation packet reviewed with patient. 30 Day ITP Reivew. Suha started intesive cardiac rehab on 06/23/22 and did well with exercise 30 Day ITP Reivew. Quanetta has good attendance and participation in  intensive cardiac rehab 30 Day ITP Reivew. Urwa has good attendance and participation in  intensive cardiac rehab. Nykeria will  finish cardiac rehab on 09/03/22             Comments: See ITP comments.Thayer Headings RN BSN

## 2022-08-21 NOTE — Telephone Encounter (Signed)
Call to patient and advised to please call office for medication changes fro Dr Servando Salina.

## 2022-08-22 ENCOUNTER — Encounter (HOSPITAL_COMMUNITY): Payer: 59

## 2022-08-25 ENCOUNTER — Encounter (HOSPITAL_COMMUNITY): Payer: 59

## 2022-08-27 ENCOUNTER — Encounter (HOSPITAL_COMMUNITY): Payer: 59

## 2022-08-29 ENCOUNTER — Encounter (HOSPITAL_COMMUNITY): Payer: 59

## 2022-09-01 ENCOUNTER — Encounter (HOSPITAL_COMMUNITY)
Admission: RE | Admit: 2022-09-01 | Discharge: 2022-09-01 | Disposition: A | Discharge status: Home / Self Care | Payer: 59 | Source: Ambulatory Visit | Attending: Cardiology | Admitting: Cardiology

## 2022-09-01 VITALS — Ht 66.0 in | Wt 192.9 lb

## 2022-09-01 DIAGNOSIS — I2089 Other forms of angina pectoris: Secondary | ICD-10-CM

## 2022-09-02 ENCOUNTER — Ambulatory Visit
Admission: RE | Admit: 2022-09-02 | Discharge: 2022-09-02 | Disposition: A | Discharge status: Home / Self Care | Payer: 59 | Source: Ambulatory Visit | Attending: Family Medicine | Admitting: Family Medicine

## 2022-09-02 DIAGNOSIS — R928 Other abnormal and inconclusive findings on diagnostic imaging of breast: Secondary | ICD-10-CM

## 2022-09-03 ENCOUNTER — Encounter (HOSPITAL_COMMUNITY)
Admission: RE | Admit: 2022-09-03 | Discharge: 2022-09-03 | Disposition: A | Discharge status: Home / Self Care | Payer: 59 | Source: Ambulatory Visit | Attending: Cardiology | Admitting: Cardiology

## 2022-09-03 DIAGNOSIS — I2089 Other forms of angina pectoris: Secondary | ICD-10-CM | POA: Diagnosis not present

## 2022-09-03 NOTE — Progress Notes (Signed)
Discharge Progress Report  Patient Details  Name: KARLINA SUARES MRN: [id number] Date of Birth: 1970-06-12 Referring Provider:   Flowsheet Row INTENSIVE CARDIAC REHAB ORIENT from 06/18/2022 in [facility]  Referring Provider Thomasene Ripple, DO        Number of Visits: 36  Reason for Discharge:  Patient reached a stable level of exercise. Patient independent in their exercise. Patient has met program and personal goals.  Smoking History:  Social History   Tobacco Use  Smoking Status Former   Packs/day: 0.20   Years: 1.00   Additional pack years: 0.00   Total pack years: 0.20   Types: Cigarettes   Quit date: 10/27/2018   Years since quitting: 3.8  Smokeless Tobacco Never  Tobacco Comments   2018  " i STILL SMOKE 3  CIGARETTES A DAY "    Diagnosis:  Chronic stable angina  ADL [facility]:   Initial Exercise Prescription:  Initial Exercise Prescription - 06/18/22 1200       Date of Initial Exercise RX and Referring Provider   Date 06/18/22    Referring Provider Thomasene Ripple, DO    Expected Discharge Date 08/29/22      Treadmill   MPH 2.5    Grade 0    Minutes 15    METs 3      Recumbant Elliptical   Level 2    RPM 60    Watts 80    Minutes 15    METs 3      Prescription Details   Frequency (times per week) 3    Duration Progress to 30 minutes of continuous aerobic without signs/symptoms of physical distress      Intensity   THRR 40-80% of Max Heartrate 66-133    Ratings of Perceived Exertion 11-13    Perceived Dyspnea 0-4      Progression   Progression Continue progressive overload as per policy without signs/symptoms or physical distress.      Resistance Training   Training Prescription Yes    Weight 2    Reps 10-15             Discharge Exercise Prescription (Final Exercise Prescription Changes):  Exercise Prescription Changes - 09/03/22 1600       Response to Exercise   Blood  Pressure (Admit) 98/60    Blood Pressure (Exercise) 100/62    Blood Pressure (Exit) 112/72    Heart Rate (Admit) 72 bpm    Heart Rate (Exercise) 106 bpm    Heart Rate (Exit) 74 bpm    Rating of Perceived Exertion (Exercise) 10    Perceived Dyspnea (Exercise) 0    Symptoms none    Comments Pt graduated CRP2    Duration Continue with 30 min of aerobic exercise without signs/symptoms of physical distress.    Intensity THRR unchanged      Progression   Progression Continue to progress workloads to maintain intensity without signs/symptoms of physical distress.    Average METs 3.28      Resistance Training   Training Prescription No      Treadmill   MPH 2.5    Grade 1    Minutes 15    METs 3.26      Recumbant Elliptical   Level 3    Watts 68    Minutes 15    METs 3.3      Home Exercise Plan   Plans to continue exercise at Home (  comment)   walking   Frequency Add 2 additional days to program exercise sessions.    Initial Home Exercises Provided 07/09/22             Functional Capacity:  6 Minute Walk     Row Name 06/18/22 1200 09/01/22 1628       6 Minute Walk   Phase Initial Discharge    Distance 1354 feet 1540 feet    Distance % Change -- 13.74 %    Distance Feet Change -- 186 ft    Walk Time 6 minutes 6 minutes    # of Rest Breaks 0 0    MPH 2.56 2.92    METS 3.46 3.92    RPE 11 11    Perceived Dyspnea  0 0    VO2 Peak 12.13 13.71    Symptoms No No    Resting HR 71 bpm 74 bpm    Resting BP 104/68 110/60    Resting Oxygen Saturation  97 % --    Exercise Oxygen Saturation  during 6 min walk 97 % --    Max Ex. HR 93 bpm 99 bpm    Max Ex. BP 112/58 118/64    2 Minute Post BP 100/56 --             Psychological, QOL, Others - Outcomes: PHQ 2/9:    09/03/2022    4:16 PM 06/18/2022   11:58 AM 11/27/2017    2:31 PM 09/23/2017    2:40 PM 08/31/2017   11:03 AM  Depression screen PHQ 2/9  Decreased Interest 0 2 3 1  0  Down, Depressed, Hopeless 0 2 3  1  0  PHQ - 2 Score 0 4 6 2  0  Altered sleeping 1 3  1    Tired, decreased energy 0 1  1   Change in appetite 0 1  1   Feeling bad or failure about yourself  0 0  1   Trouble concentrating 0 0  1   Moving slowly or fidgety/restless 0 0     Suicidal thoughts 0 0  0   PHQ-9 Score 1 9  7    Difficult doing work/chores Not difficult at all Somewhat difficult       Quality of Life:  Quality of Life - 06/18/22 1204       Quality of Life   Select Quality of Life      Quality of Life Scores   Health/Function Pre 20.03 %    Socioeconomic Pre 22.81 %    Psych/Spiritual Pre 24.86 %    Family Pre 21.4 %    GLOBAL Pre 21.83 %             Personal Goals: Goals established at orientation with interventions provided to work toward goal.  Personal Goals and Risk Factors at Admission - 06/18/22 1206       Core Components/Risk Factors/Patient Goals on Admission    Weight Management Yes;Obesity;Weight Loss    Intervention Obesity: Provide education and appropriate resources to help participant work on and attain dietary goals.;Weight Management/Obesity: Establish reasonable short term and long term weight goals.;Weight Management: Provide education and appropriate resources to help participant work on and attain dietary goals.;Weight Management: Develop a combined nutrition and exercise program designed to reach desired caloric intake, while maintaining appropriate intake of nutrient and fiber, sodium and fats, and appropriate energy expenditure required for the weight goal.    Admit Weight 196 lb 13.9 oz (89.3 kg)  Goal Weight: Long Term 150 lb (68 kg)    Expected Outcomes Short Term: Continue to assess and modify interventions until short term weight is achieved;Long Term: Adherence to nutrition and physical activity/exercise program aimed toward attainment of established weight goal;Weight Loss: Understanding of general recommendations for a balanced deficit meal plan, which promotes 1-2 lb  weight loss per week and includes a negative energy balance of [phone] kcal/d;Understanding recommendations for meals to include 15-35% energy as protein, 25-35% energy from fat, 35-60% energy from carbohydrates, less than 200mg  of dietary cholesterol, 20-35 gm of total fiber daily;Understanding of distribution of calorie intake throughout the day with the consumption of 4-5 meals/snacks    Hypertension Yes    Intervention Provide education on lifestyle modifcations including regular physical activity/exercise, weight management, moderate sodium restriction and increased consumption of fresh fruit, vegetables, and low fat dairy, alcohol moderation, and smoking cessation.;Monitor prescription use compliance.    Expected Outcomes Short Term: Continued assessment and intervention until BP is < 140/81mm HG in hypertensive participants. < 130/25mm HG in hypertensive participants with diabetes, heart failure or chronic kidney disease.;Long Term: Maintenance of blood pressure at goal levels.    Lipids Yes    Intervention Provide education and support for participant on nutrition & aerobic/resistive exercise along with prescribed medications to achieve LDL 70mg , HDL >40mg .    Expected Outcomes Short Term: Participant states understanding of desired cholesterol values and is compliant with medications prescribed. Participant is following exercise prescription and nutrition guidelines.;Long Term: Cholesterol controlled with medications as prescribed, with individualized exercise RX and with personalized nutrition plan. Value goals: LDL < 70mg , HDL > 40 mg.    Stress Yes    Intervention Offer individual and/or small group education and counseling on adjustment to heart disease, stress management and health-related lifestyle change. Teach and support self-help strategies.;Refer participants experiencing significant psychosocial distress to appropriate mental health specialists for further evaluation and treatment.  When possible, include family members and significant others in education/counseling sessions.    Expected Outcomes Short Term: Participant demonstrates changes in health-related behavior, relaxation and other stress management skills, ability to obtain effective social support, and compliance with psychotropic medications if prescribed.;Long Term: Emotional wellbeing is indicated by absence of clinically significant psychosocial distress or social isolation.              Personal Goals Discharge:  Goals and Risk Factor Review     Row Name 06/23/22 1701 06/27/22 1714 07/23/22 1738 08/20/22 1627       Core Components/Risk Factors/Patient Goals Review   Personal Goals Review Weight Management/Obesity;Hypertension;Lipids;Stress Weight Management/Obesity;Hypertension;Lipids;Stress Weight Management/Obesity;Hypertension;Lipids;Stress Weight Management/Obesity;Hypertension;Lipids;Stress    Review Luma started intensive caridac rehab on 06/23/22 and did well with exercise. Vital signs were stable Maryanna started intensive caridac rehab on 06/23/22 and is off to a good start to exercise. Vital signs were stable. Reports left ankle swelling Dr Mallory Shirk office called and notified Kieu is doing well with exercise at intensive . Vital signs have been  stable. Brigitt is doing well with exercise at intensive . Vital signs have been  stable. Wells will complete intensive cardiac rehab on 09/03/22    Expected Outcomes Tamela Gammon will continue to participate in intensive cardiac rehab for exercise, nutrition and lifestyle modifications Tamela Gammon will continue to participate in intensive cardiac rehab for exercise, nutrition and lifestyle modifications Tamela Gammon will continue to participate in intensive cardiac rehab for exercise, nutrition and lifestyle modifications Tamela Gammon will continue to participate in intensive cardiac rehab for exercise, nutrition and lifestyle  modifications             Exercise Goals  and Review:  Exercise Goals     Row Name 06/18/22 1203             Exercise Goals   Increase Physical Activity Yes       Intervention Develop an individualized exercise prescription for aerobic and resistive training based on initial evaluation findings, risk stratification, comorbidities and participant's personal goals.;Provide advice, education, support and counseling about physical activity/exercise needs.       Expected Outcomes Short Term: Attend rehab on a regular basis to increase amount of physical activity.;Long Term: Exercising regularly at least 3-5 days a week.;Long Term: Add in home exercise to make exercise part of routine and to increase amount of physical activity.       Increase Strength and Stamina Yes       Intervention Provide advice, education, support and counseling about physical activity/exercise needs.;Develop an individualized exercise prescription for aerobic and resistive training based on initial evaluation findings, risk stratification, comorbidities and participant's personal goals.       Expected Outcomes Short Term: Perform resistance training exercises routinely during rehab and add in resistance training at home;Short Term: Increase workloads from initial exercise prescription for resistance, speed, and METs.;Long Term: Improve cardiorespiratory fitness, muscular endurance and strength as measured by increased METs and functional capacity ( )       Able to understand and use rate of perceived exertion (RPE) scale Yes       Intervention Provide education and explanation on how to use RPE scale       Expected Outcomes Short Term: Able to use RPE daily in rehab to express subjective intensity level;Long Term:  Able to use RPE to guide intensity level when exercising independently       Knowledge and understanding of Target Heart Rate Range (THRR) Yes       Intervention Provide education and explanation of THRR including how the numbers were predicted and where  they are located for reference       Expected Outcomes Short Term: Able to state/look up THRR;Short Term: Able to use daily as guideline for intensity in rehab;Long Term: Able to use THRR to govern intensity when exercising independently       Understanding of Exercise Prescription Yes       Intervention Provide education, explanation, and written materials on patient's individual exercise prescription       Expected Outcomes Short Term: Able to explain program exercise prescription;Long Term: Able to explain home exercise prescription to exercise independently                Exercise Goals Re-Evaluation:  Exercise Goals Re-Evaluation     Row Name 06/23/22 1644 07/09/22 1522 07/21/22 1649 08/20/22 1622 09/03/22 1644     Exercise Goal Re-Evaluation   Exercise Goals Review Increase Physical Activity;Understanding of Exercise Prescription;Increase Strength and Stamina;Knowledge and understanding of Target Heart Rate Range (THRR);Able to understand and use rate of perceived exertion (RPE) scale Increase Physical Activity;Understanding of Exercise Prescription;Increase Strength and Stamina;Knowledge and understanding of Target Heart Rate Range (THRR);Able to understand and use rate of perceived exertion (RPE) scale Increase Physical Activity;Understanding of Exercise Prescription;Increase Strength and Stamina;Knowledge and understanding of Target Heart Rate Range (THRR);Able to understand and use rate of perceived exertion (RPE) scale Increase Physical Activity;Understanding of Exercise Prescription;Increase Strength and Stamina;Knowledge and understanding of Target Heart Rate Range (THRR);Able to understand and use rate of perceived exertion (RPE)  scale Increase Physical Activity;Understanding of Exercise Prescription;Increase Strength and Stamina;Knowledge and understanding of Target Heart Rate Range (THRR);Able to understand and use rate of perceived exertion (RPE) scale   Comments Pt first day in  teh [facility] I[facility] program. Pt tolerated exercise well with an average MET level of 2.46. Pt is learning her THRR, RPE and ExRx. Pt off to a good start. Reviewed exercise prescription with patient. Patient is walking 30 minutes, 2 days/week. Patient's goal is to learn how to eat better and exercise to lose weight. Discussed increasing duration and/or days/week to help achieve weight loss goals. Patient has the exercise band provided for resistance training but hasn't started using it yet. Advised patient she can bring it in for instruction on the exercises if need be. Patient can check her heart rate on her phone. Reviewed MET's and goals. Pt is tolerating exercise well with an average MET Level of 3.06. She is in good spirits and feels good about her goals. She has increased her walking time to 45 mins and is enjoying being outside and meeting her neighbors. She is also feeling good with her goal of being healthier and eating well, she states she is doing well and has all the resources she needs Reviewed MET's and goals. Pt is tolerating exercise well with an average MET Level of 3.28. Pt feels good about her goals of wt loss, increasing heart strength and feeling better. She states she has been eating better and increasing her home exercise, she has had some streess but she is concidering seeking counseling. Overall though pt in doing well and is increasing her MET's Pt graduated CRP2 program today. Pt tolerated a peak MET level of 3.5 and completed 22 exercise sessions. Pt showed a 13.74% increase on her and lost around 2kg. Pt made great progress towards her goals and feels accomplished with her time in the program. Pt plans to continue to exercise by walking, using resistance bands, and plans to find a personal trainer at [employer] in [place]. Exercise parameters were reviewed with pt and she voiced understanding. Overall pt did very well in the CRP2 program.   Expected Outcomes Will continue to monitor pt  and progress workloads as tolerated without sign or symptom Patient will continue walking at least 30 minutes 2-4 days/week at home. Patient will add 1-2 minutes as tolerated to increase duration to help achieve weight loss goals. Will continue to monitor pt and progress workloads as tolerated without sign or symptom Will continue to monitor pt and progress workloads as tolerated without sign or symptom Pt will continue to exercise on her own following CRP2 parameters.            Nutrition & Weight - Outcomes:  Pre Biometrics - 06/18/22 1025       Pre Biometrics   Waist Circumference 41 inches    Hip Circumference 47.5 inches    Waist to Hip Ratio 0.86 %    Triceps Skinfold 34 mm    % Body Fat 42.9 %    Grip Strength 30 kg    Flexibility 11 in    Single Leg Stand 16.23 seconds             Post Biometrics - 09/01/22 1636        Post  Biometrics   Height 5\' 6"  (1.676 m)    Weight 87.5 kg    Waist Circumference 40 inches    Hip Circumference 44.25 inches    Waist to Hip  Ratio 0.9 %    BMI (Calculated) 31.15    Triceps Skinfold 25 mm    % Body Fat 40.5 %    Grip Strength 30 kg    Flexibility 11.5 in    Single Leg Stand 17.9 seconds             Nutrition:  Nutrition Therapy & Goals - 08/19/22 0904       Nutrition Therapy   Diet Heart healthy diet    Drug/Food Interactions Statins/Certain Fruits      Personal Nutrition Goals   Nutrition Goal Patient to identify strategies for reducing cardiovascular risk by attending the [facility] education and nutrition series weekly.    Personal Goal #2 Patient to improve diet quality by using the plate method as a guide for meal planning to include lean protein/plant protein, fruits, vegetables, whole grains, nonfat dairy as part of a well-balanced diet.    Personal Goal #3 Patient to reduce sodium to 1500mg  per day    Comments Goals in action. She continues to attend the [facility] education and nutrition series regularly.  Shemekia has started making many dietary changes including increased vegetables/fruit, decreased sweets/simple sugars, and is walking regularly at home. She is down 5.1# since starting with our program. She will continue to benefit from participation in intensive cardiac rehab for nutrition, exercise, and lifestyle modification.      Intervention Plan   Intervention Prescribe, educate and counsel regarding individualized specific dietary modifications aiming towards targeted core components such as weight, hypertension, lipid management, diabetes, heart failure and other comorbidities.;Nutrition handout(s) given to patient.    Expected Outcomes Short Term Goal: Understand basic principles of dietary content, such as calories, fat, sodium, cholesterol and nutrients.;Long Term Goal: Adherence to prescribed nutrition plan.             Nutrition Discharge:  Nutrition Assessments - 09/03/22 1121       Rate Your Plate Scores   Pre Score 50    Post Score 79             Education Questionnaire Score:  Knowledge Questionnaire Score - 06/18/22 1205       Knowledge Questionnaire Score   Pre Score 20/24             Goals reviewed with patient; copy given to patient. Syria  graduates from  Intensive/Traditional cardiac rehab program on 09/03/22  with completion of  44  exercise and education sessions. Pt maintained good attendance and progressed nicely during their participation in rehab as evidenced by increased MET level. Steffi increased her distance on her post exercise walk test by 186 feet. Becca lost 2.2 kg while in the program. Medication list reconciled. Repeat  PHQ score- 1 .  Pt has made significant lifestyle changes and should be commended for their success. Alvaretta  achieved her  goals during cardiac rehab.   Pt plans to continue exercise at home by walking and is consider joining the gym. Carlos reported that she sometimes feel lightheaded in the mornings. Resting  systolic blood pressures in the 90's to low 100's. Asymptomatic during exercise at cardiac rehab. Will notify Dr Servando Salina as her medications were recently adjusted. We are proud of Autum's progress! Thayer Headings RN BSN

## 2022-09-04 NOTE — Telephone Encounter (Signed)
-----   Message from Meriam Sprague, MD sent at 09/04/2022  2:27 PM EDT ----- Thank you so much for letting us know.  Jasmine, do you mind contacting the patient and decreasing hydrochlorothiazide to 12.5mg  daily and have her keep a BP log for Dr. Servando Salina to review? Thank you!! ----- Message ----- From: Cammy Copa, RN Sent: 09/04/2022   8:40 AM EDT To: Thomasene Ripple, DO; Reynolds Bowl, RN  Good morning Dr Servando Salina, [facility] completed cardiac rehab yesterday. I want to bring it to your attention that Margery reported that she sometimes feel lightheaded in the mornings. Resting systolic blood pressures in the 90's to low 100's at cardiac rehab. Asymptomatic during exercise at cardiac rehab. Syleena had not been exercising regularly recently as she has been taking her daughter to band camp. Alexsandria completed cardiac rehab yesterday. You recently discontinued her amlodipine and increased her HTCZ. Ellysia will follow up with you again in December.  Please advise the patient as neccessary, Sincerely, Gladstone Lighter RN Cardiac Rehab

## 2022-09-09 ENCOUNTER — Telehealth: Payer: Self-pay

## 2022-09-09 NOTE — Telephone Encounter (Signed)
Meriam Sprague, MD  Cammy Copa, RN; Thomasene Ripple, DO; Reynolds Bowl, RN Thank you so much for letting us know.  Aletha Allebach, do you mind contacting the patient and decreasing hydrochlorothiazide to 12.5mg  daily and have her keep a BP log for Dr. Servando Salina to review? Thank you!!       Previous Messages    ----- Message ----- From: Cammy Copa, RN Sent: 09/04/2022   8:40 AM EDT To: Thomasene Ripple, DO; Reynolds Bowl, RN  Good morning Dr Servando Salina, Ms [facility] completed cardiac rehab yesterday. I want to bring it to your attention that Riki reported that she sometimes feel lightheaded in the mornings. Resting systolic blood pressures in the 90's to low 100's at cardiac rehab. Asymptomatic during exercise at cardiac rehab. Sinahi had not been exercising regularly recently as she has been taking her daughter to band camp. Ahni completed cardiac rehab yesterday. You recently discontinued her amlodipine and increased her HTCZ. Tamyra will follow up with you again in December.  Please advise the patient as neccessary, Sincerely, Gladstone Lighter RN Cardiac Rehab     Called pt to relay Dr. Devin Going message. No answer. Left instructions on her machine.

## 2022-09-09 NOTE — Progress Notes (Signed)
Called pt- No answer, left message

## 2022-09-15 ENCOUNTER — Telehealth: Payer: Self-pay | Admitting: Cardiology

## 2022-09-15 DIAGNOSIS — I2510 Atherosclerotic heart disease of native coronary artery without angina pectoris: Secondary | ICD-10-CM

## 2022-09-15 NOTE — Telephone Encounter (Signed)
*  STAT* If patient is at the pharmacy, call can be transferred to refill team.   1. Which medications need to be refilled? (please list name of each medication and dose if known) hydrochlorothiazide (HYDRODIURIL) 12.5 MG tablet   2. Which pharmacy/location (including street and city if local pharmacy) is medication to be sent to? [facility] - [place], [place] - [address] [facility].   3. Do they need a 30 day or 90 day supply? 90

## 2022-09-16 MED ORDER — HYDROCHLOROTHIAZIDE 25 MG PO TABS: 12.5000 mg | ORAL_TABLET | Freq: Every day | ORAL | 3 refills | Status: DC

## 2022-09-16 NOTE — Telephone Encounter (Signed)
Called pt to relay message. She states she she took half a pill this morning.She states he blood pressure has been running high. Yesterday it was 138/90, this morning it was 143/88. Pt c/o lightheadedness yesterday, she took her blood pressure and got thew 138/90 readings. Pt does not have Cardiac Rehab anymore, her last session was 09/03/22. Went over blood pressure tips and sent over updated rx instructions. Pt will continue to take her blood pressure daily and record them.

## 2023-02-17 ENCOUNTER — Ambulatory Visit: Payer: 59 | Admitting: Cardiology

## 2023-02-18 ENCOUNTER — Ambulatory Visit: Payer: 59 | Attending: Cardiology | Admitting: Cardiology

## 2023-02-18 ENCOUNTER — Encounter: Payer: Self-pay | Admitting: Cardiology

## 2023-02-18 VITALS — BP 132/87 | HR 72 | Ht 66.0 in | Wt 202.0 lb

## 2023-02-18 DIAGNOSIS — I10 Essential (primary) hypertension: Secondary | ICD-10-CM

## 2023-02-18 DIAGNOSIS — I2510 Atherosclerotic heart disease of native coronary artery without angina pectoris: Secondary | ICD-10-CM

## 2023-02-18 DIAGNOSIS — G459 Transient cerebral ischemic attack, unspecified: Secondary | ICD-10-CM

## 2023-02-18 DIAGNOSIS — E782 Mixed hyperlipidemia: Secondary | ICD-10-CM

## 2023-02-18 NOTE — Progress Notes (Unsigned)
Cardiology Office Note:    Date:  02/19/2023   ID:  Lindsey [name] September 18, 1970, MRN [id number]  PCP:  [name] (Inactive)  Cardiologist:  Thomasene Ripple, DO  Electrophysiologist:  None   Referring MD: No ref. provider found   " I am having chest pain that is getting worse"  History of Present Illness:    Lindsey [name] is a 52 y.o. female with a hx of CAD, with hx of apical infarct in 2000, STEMI in 2009 with BMS to the proximal LAD with history of apical thrombus, hypertension, hyperlipidemia, TIA, history of MCA infarct in April 2019 AT III deficiency on anticoagulation, current smoker here today to be evaluated for chest pain.  She reports that her blood pressure has been well-controlled, consistently in the 130s. However, she has been experiencing a sensation of 'squeeze' before the next dose of her medication is due, typically around 9 hours after the last dose. She also describes a persistent dry feeling in her chest, similar to a dry cough, despite adequate hydration with at least eight bottles of water daily. She is also actively trying to lose weight and has been working out at [employer]  Past Medical History:  Diagnosis Date   Anemia    Antithrombin III deficiency (HCC)    ?pt not sure if true diagnosis   Anxiety    Apical mural thrombus    Blood transfusion    Cholecystitis 07/2016   Coronary artery disease    a. apical LAD infarction '00. b. NSTEMI s/p BMS to prox LAD '09. c. Cath 01/2015: stable LAD stent, otherwise minimal nonobstructive CAD.   GERD (gastroesophageal reflux disease)    Hyperlipidemia    Hypertension    Myocardial infarction (HCC)    Noncompliance with medication regimen    a. h/o noncompliance with med regimen (previous running out of Coumadin).   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   Stroke (HCC)    assoc with short term memory loss and right peripheral vision loss; age 56    TIA (transient  ischemic attack) 2010   Tobacco abuse     Past Surgical History:  Procedure Laterality Date   CARDIAC CATHETERIZATION     CARDIAC CATHETERIZATION N/A 01/29/2015   Procedure: Left Heart Cath and Coronary Angiography;  Surgeon: Peter M Swaziland, MD;  Location: [facility] INVASIVE CV LAB;  Service: Cardiovascular;  Laterality: N/A;   CHOLECYSTECTOMY N/A 08/14/2016   Procedure: LAPAROSCOPIC CHOLECYSTECTOMY;  Surgeon: Gaynelle Adu, MD;  Location: [facility] OR;  Service: General;  Laterality: N/A;   COLONOSCOPY N/A 03/29/2014   Procedure: COLONOSCOPY;  Surgeon: Louis Meckel, MD;  Location: [facility] ENDOSCOPY;  Service: Endoscopy;  Laterality: N/A;   CORONARY ANGIOPLASTY     ESOPHAGOGASTRODUODENOSCOPY N/A 06/09/2012   Procedure: ESOPHAGOGASTRODUODENOSCOPY (EGD);  Surgeon: Theda Belfast, MD;  Location: [facility] ENDOSCOPY;  Service: Endoscopy;  Laterality: N/A;   ESOPHAGOGASTRODUODENOSCOPY N/A 08/02/2013   Procedure: ESOPHAGOGASTRODUODENOSCOPY (EGD);  Surgeon: Meryl Dare, MD;  Location: [facility] ENDOSCOPY;  Service: Endoscopy;  Laterality: N/A;   IR ANGIO INTRA EXTRACRAN SEL COM CAROTID INNOMINATE UNI R MOD SED  07/08/2017   LEFT HEART CATH AND CORONARY ANGIOGRAPHY N/A 02/20/2022   Procedure: LEFT HEART CATH AND CORONARY ANGIOGRAPHY;  Surgeon: Lennette Bihari, MD;  Location: [facility] INVASIVE CV LAB;  Service: Cardiovascular;  Laterality: N/A;   LEFT HEART CATHETERIZATION WITH CORONARY ANGIOGRAM N/A 12/24/2011   Procedure: LEFT HEART CATHETERIZATION WITH CORONARY ANGIOGRAM;  Surgeon: Kathleene Hazel, MD;  Location: [facility] CATH LAB;  Service: Cardiovascular;  Laterality: N/A;   RADIOLOGY WITH ANESTHESIA N/A 07/08/2017   Procedure: IR WITH ANESTHESIA;  Surgeon: Radiologist, Medication, MD;  Location: [facility] OR;  Service: Radiology;  Laterality: N/A;   TUBAL LIGATION      Current Medications: Current Meds  Medication Sig   atorvastatin (LIPITOR) 40 MG tablet Take 1 tablet (40 mg total) by mouth daily.   clopidogrel (PLAVIX) 75 MG tablet  Take 1 tablet (75 mg total) by mouth daily.   diclofenac Sodium (VOLTAREN) 1 % GEL Apply 4 g topically 4 (four) times daily.   fluticasone (FLONASE) 50 MCG/ACT nasal spray Place 2 sprays into both nostrils daily.   isosorbide mononitrate (IMDUR) 60 MG 24 hr tablet Take 1 tablet (60 mg total) by mouth daily.   nitroGLYCERIN (NITROSTAT) 0.4 MG SL tablet Place 1 tablet (0.4 mg total) under the tongue every 5 (five) minutes as needed for chest pain (up to 3 doses).   pantoprazole (PROTONIX) 40 MG tablet Take 1 tablet (40 mg total) by mouth 2 (two) times daily.   ranolazine (RANEXA) 500 MG 12 hr tablet Take 1 tablet (500 mg total) by mouth 2 (two) times daily.   rivaroxaban (XARELTO) 20 MG TABS tablet Take 1 tablet (20 mg total) by mouth daily.   traZODone (DESYREL) 50 MG tablet Take 100 mg by mouth once.     Allergies:   Patient has no known allergies.   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  Tobacco Use   Smoking status: Former    Current packs/day: 0.00    Average packs/day: 0.2 packs/day for 1 year (0.2 ttl pk-yrs)    Types: Cigarettes    Start date: 10/26/2017    Quit date: 10/27/2018    Years since quitting: 4.3   Smokeless tobacco: Never   Tobacco comments:    2018  " i STILL SMOKE 3  CIGARETTES A DAY "  Vaping Use   Vaping status: Former  Substance and Sexual Activity   Alcohol use: Yes    Alcohol/week: 0.0 standard drinks of alcohol    Comment: Social drinker   Drug use: Not Currently    Types: Cocaine    Comment: hx of use x 1 in the past   Sexual activity: Not Currently    Birth control/protection: Surgical  Other Topics Concern   Not on file  Social History Narrative   Not on file   Social Determinants of Health   Financial Resource  Strain: Not on File (07/04/2021)   Received from [employer], [place] [employer] Strain    Financial Resource Strain: 0  Food Insecurity: Not on File (12/11/2022)   Received from [facility] Insecurity    Food: 0  Transportation Needs: Not on File (07/04/2021)   Received from [employer], [employer] Needs    Transportation: 0  Physical Activity: Not on File (07/04/2021)   Received from [place], [place]   Physical Activity    Physical Activity: 0  Stress: Not on File (07/04/2021)   Received from [facility], [place]   Stress    Stress: 0  Social Connections: Not on File (11/29/2022)   Received  from [facility]   Social Connections    Connectedness: 0     Family History: The patient's family history includes Breast cancer in her maternal aunt; Heart disease in her brother; Stroke in her father.  ROS:   Review of Systems  Constitution: Negative for decreased appetite, fever and weight gain.  HENT: Negative for congestion, ear discharge, hoarse voice and sore throat.   Eyes: Negative for discharge, redness, vision loss in right eye and visual halos.  Cardiovascular: Reports chest pain.negative for  dyspnea on exertion, leg swelling, orthopnea and palpitations.  Respiratory: Negative for cough, hemoptysis, shortness of breath and snoring.   Endocrine: Negative for heat intolerance and polyphagia.  Hematologic/Lymphatic: Negative for bleeding problem. Does not bruise/bleed easily.  Skin: Negative for flushing, nail changes, rash and suspicious lesions.  Musculoskeletal: Negative for arthritis, joint pain, muscle cramps, myalgias, neck pain and stiffness.  Gastrointestinal: Negative for abdominal pain, bowel incontinence, diarrhea and excessive appetite.  Genitourinary: Negative for decreased libido, genital sores and incomplete emptying.  Neurological: Negative for brief paralysis, focal weakness, headaches and loss of balance.  Psychiatric/Behavioral: Negative for altered mental status,  depression and suicidal ideas.  Allergic/Immunologic: Negative for HIV exposure and persistent infections.    EKGs/Labs/Other Studies Reviewed:    The following studies were reviewed today:   EKG: Normal sinus rhythm with sinus arrhythmia ,  LHC 02/20/2022   Prox LAD to Mid LAD lesion is 100% stenosed.   The left ventricular ejection fraction is 50-55% by visual estimate.   Total occlusion of the proximal LAD at the proximal stent site.   Normal large ramus intermediate vessel; normal left circumflex coronary artery, and normal dominant RCA with extensive collateralization to the LAD to the RCA up to the distal portion of the stented segment.   There is mild mid inferior hypocontractility with suggestion of mild LVH and EF estimated at 50 to 55%.  LVEDP 18 mmHg.   RECOMMENDATION: Medical therapy with well collateralized LAD from the RCA. Continue Plavix and resume Xarelto commencing tomorrow.  Aggressive lipid-lowering therapy with target LDL less than 70.  Will titrate isosorbide to 60 mg.  Continue additional antiischemic medical therapy.  Of note, the patient had been on combined antiplatelet and anticoagulation therapy and had run out of her Plavix approximately 1 month ago and 2 weeks later most likely occluded her proximal LAD stent on Xarelto.     Nuclear stress test July 2019 The left ventricular ejection fraction is mildly decreased (45-54%). Nuclear stress EF: 54%. No T wave inversion was noted during stress. There was no ST segment deviation noted during stress. Defect 1: There is a medium defect of severe severity. Findings consistent with prior myocardial infarction. This is a low risk study.  Medium size, severe intensity fixed apical perfusion defect, suggestive of scar. No reversible ischemia. LVEF 54% with apical dyskinesis. This is a low risk study.  TTE 07/09/2017 Study Conclusions  - Left ventricle: The cavity size was normal. Wall thickness was    increased in  a pattern of mild LVH. Systolic function was normal.    The estimated ejection fraction was in the range of 55% to 60%.    Akinesis of the apical myocardium. Doppler parameters are    consistent with abnormal left ventricular relaxation (grade 1    diastolic dysfunction). There was a medium-sized, fixed,  apicalthrombus.  - Left atrium: The atrium was mildly dilated.    Cardiac MR 01/2015 FINDINGS: The atria were normal in size. RV  normal in size and function. Mild MR. Normal aortic valve. Trivial pericardial effusion No ASD/VSD. The distal anterior wall, septum and entire apex were akinetic   The quantitative EF was 50% with normal mid and basal function. Mild LVH septal thickness 12 mm.   Delayed enhancement images with gadolinium showed full thickness scar involving the distal anterior wall, septum and apex. Long TI imaging post contrast showed a small amount   Of mural apical thrombus   IMPRESSION: 1) Mild LVE/LVH EF 50% with distal anterior wall, septal and apical akinesis   2) Post Gadolinium images with full thickness scar involving above areas   3) Long TI inversion recovery sequences post contrast show a small amount of mural apical thrombus   4) Mild MR   5) Trivial pericardial effusion   Charlton Haws     Electronically Signed   By: Charlton Haws M.D.   On: 01/31/2015 16:49    LHC 2016 Prox RCA lesion, 20% stenosed. Mid LAD to Dist LAD lesion, 20% stenosed. There is mild left ventricular systolic dysfunction.   1. Minimal nonobstructive CAD. Patent stent in the LAD 2. Mild LV dysfunction with chronic akinesis of the apex. 3. Complex filling defect in the LV apex consistent with thrombus.   Plan: resume full anticoagulation. Will assess with Echo.   Recent Labs: No results found for requested labs within last 365 days.  Recent Lipid Panel    Component Value Date/Time   CHOL 124 08/14/2022 0901   TRIG 73 08/14/2022 0901   HDL 54 08/14/2022 0901    CHOLHDL 2.3 08/14/2022 0901   CHOLHDL 2.2 11/03/2018 0834   VLDL 13 11/03/2018 0834   LDLCALC 55 08/14/2022 0901    Physical Exam:    VS:  BP 132/87 (BP Location: Left Arm, Patient Position: Sitting, Cuff Size: Normal)   Pulse 72   Ht 5\' 6"  (1.676 m)   Wt 202 lb (91.6 kg)   LMP 04/02/2015   SpO2 97%   BMI 32.60 kg/m     Wt Readings from Last 3 Encounters:  02/18/23 202 lb (91.6 kg)  09/01/22 192 lb 14.4 oz (87.5 kg)  08/14/22 189 lb 6.4 oz (85.9 kg)     GEN: Well nourished, well developed in no acute distress HEENT: Normal NECK: No JVD; No carotid bruits LYMPHATICS: No lymphadenopathy CARDIAC: S1S2 noted,RRR, no murmurs, rubs, gallops RESPIRATORY:  Clear to auscultation without rales, wheezing or rhonchi  ABDOMEN: Soft, non-tender, non-distended, +bowel sounds, no guarding. EXTREMITIES: No edema, No cyanosis, no clubbing MUSCULOSKELETAL:  No deformity  SKIN: Warm and dry NEUROLOGIC:  Alert and oriented x 3, non-focal PSYCHIATRIC:  Normal affect, good insight  ASSESSMENT:    1. Essential hypertension   2. Atherosclerosis of native coronary artery of native heart without angina pectoris   3. TIA (transient ischemic attack)   4. Mixed hyperlipidemia      PLAN:    Cad with chronic stable angina - Patient reports feeling a "squeeze" before the next dose of Ranolazine is due. Currently on Ranolazine 500mg  every 12 hours. Discussed options of either taking the medication every 10 hours or increasing the dose to 1000mg  twice daily. -Take Ranolazine 500mg  every 10 hours. -If symptoms persist, plan to increase dose to 1000mg  twice daily.  Hypertension Well controlled with current regimen. Blood pressure consistently in the 130s. -Continue current antihypertensive regimen.  General Health Maintenance -Continue current medications including Lipitor, Plavix, Hydrochlorothiazide, Imdur, Ranexa, and Xarelto. -Return for follow-up in 6 months.  Hyperlipidemia - continue  current dose of statin  Her blood pressure is at target in office no changes will be made.  Hyperlipidemia - continue with current statin medication.    Smoking cessation advised today in the office.   The patient is in agreement with the above plan. The patient left the office in stable condition.  The patient will follow up in    Medication Adjustments/Labs and Tests Ordered: Current medicines are reviewed at length with the patient today.  Concerns regarding medicines are outlined above.  Orders Placed This Encounter  Procedures   EKG 12-Lead   No orders of the defined types were placed in this encounter.   Patient Instructions  Medication Instructions:  Your physician recommends that you continue on your current medications as directed. Please refer to the Current Medication list given to you today.  *If you need a refill on your cardiac medications before your next appointment, please call your pharmacy*   Follow-Up: At [facility], you and your health needs are our priority.  As part of our continuing mission to provide you with exceptional heart care, we have created designated Provider Care Teams.  These Care Teams include your primary Cardiologist (physician) and Advanced Practice Providers (APPs -  Physician Assistants and Nurse Practitioners) who all work together to provide you with the care you need, when you need it.   Your next appointment:   6 month(s)  Provider:   Thomasene Ripple, DO     Adopting a Healthy Lifestyle.  Know what a healthy weight is for you (roughly BMI <25) and aim to maintain this   Aim for 7+ servings of fruits and vegetables daily   65-80+ fluid ounces of water or unsweet tea for healthy kidneys   Limit to max 1 drink of alcohol per day; avoid smoking/tobacco   Limit animal fats in diet for cholesterol and heart health - choose grass fed whenever available   Avoid highly processed foods, and foods high in saturated/trans  fats   Aim for low stress - take time to unwind and care for your mental health   Aim for 150 min of moderate intensity exercise weekly for heart health, and weights twice weekly for bone health   Aim for 7-9 hours of sleep daily   When it comes to diets, agreement about the perfect plan isnt easy to find, even among the experts. Experts at the [facility] of [employer] developed an idea known as the Healthy Eating Plate. Just imagine a plate divided into logical, healthy portions.   The emphasis is on diet quality:   Load up on vegetables and fruits - one-half of your plate: Aim for color and variety, and remember that potatoes dont count.   Go for whole grains - one-quarter of your plate: Whole wheat, barley, wheat berries, quinoa, oats, brown rice, and foods made with them. If you want pasta, go with whole wheat pasta.   Protein power - one-quarter of your plate: Fish, chicken, beans, and nuts are all healthy, versatile protein sources. Limit red meat.   The diet, however, does go beyond the plate, offering a few other suggestions.   Use healthy plant oils, such as olive, canola, soy, corn, sunflower and peanut. Check the labels, and avoid partially hydrogenated oil, which have unhealthy trans fats.   If youre thirsty, drink water. Coffee and tea are good in moderation, but skip sugary drinks and limit milk and dairy products to one or two daily  servings.   The type of carbohydrate in the diet is more important than the amount. Some sources of carbohydrates, such as vegetables, fruits, whole grains, and beans-are healthier than others.   Finally, stay active  Signed, Thomasene Ripple, DO  02/19/2023 8:29 AM    [facility]

## 2023-02-18 NOTE — Patient Instructions (Signed)
 Medication Instructions:  Your physician recommends that you continue on your current medications as directed. Please refer to the Current Medication list given to you today.  *If you need a refill on your cardiac medications before your next appointment, please call your pharmacy*    Follow-Up: At [facility], you and your health needs are our priority.  As part of our continuing mission to provide you with exceptional heart care, we have created designated Provider Care Teams.  These Care Teams include your primary Cardiologist (physician) and Advanced Practice Providers (APPs -  Physician Assistants and Nurse Practitioners) who all work together to provide you with the care you need, when you need it.   Your next appointment:   6 month(s)  Provider:   Thomasene Ripple, DO

## 2023-04-02 ENCOUNTER — Telehealth: Payer: Self-pay | Admitting: Cardiology

## 2023-04-02 NOTE — Telephone Encounter (Signed)
   Pre-operative Risk Assessment    Patient Name: Lindsey [name]  DOB: 1970-07-30 MRN: [id number]   Date of last office visit: 02/18/2023 Date of next office visit: none   Request for Surgical Clearance    Procedure:   right first dorsal compartment dequervains release  Date of Surgery:  Clearance TBD                                Surgeon:  Dr. Fanny Bien Surgeon's Group or Practice Name:  [facility] Phone number:  [phone] Fax number:  [phone]   Type of Clearance Requested:   - Pharmacy:  Hold Clopidogrel (Plavix) and Apixaban (Eliquis)     Type of Anesthesia:  Not Indicated   Additional requests/questions:    Sharen Hones   04/02/2023, 10:42 AM

## 2023-04-03 ENCOUNTER — Other Ambulatory Visit: Payer: Self-pay | Admitting: Cardiology

## 2023-04-03 NOTE — Telephone Encounter (Signed)
   Patient Name: Lindsey [name]  DOB: [birth year] MRN: [id number]  Primary Cardiologist: Thomasene Ripple, DO  Chart reviewed as part of pre-operative protocol coverage. Given past medical history and time since last visit, based on ACC/AHA guidelines, Lindsey [name] is at acceptable risk for the planned procedure without further cardiovascular testing.   Patient may hold Plavix 5 days prior to procedure and should restart postprocedure when surgically safe and hemostasis is achieved.  Guidance for holding Xarelto should come from prescribing provider and is currently not managed by cardiology.  The patient was advised that if she develops new symptoms prior to surgery to contact our office to arrange for a follow-up visit, and she verbalized understanding.  I will route this recommendation to the requesting party via Epic fax function and remove from pre-op pool.  Please call with questions.  Napoleon Form, Leodis Rains, NP 04/03/2023, 9:32 AM

## 2023-04-03 NOTE — Telephone Encounter (Signed)
Good Morning Dr. Servando Salina  We have received a surgical clearance request for Ms. Vanorman for upcoming right first dorsal de Quervain's release. They were seen recently in clinic on 02/18/23. Can you please comment on surgical clearance and guidance on holding her Plavix prior to her upcoming procedure. Please forward you guidance and recommendations to P CV DIV PREOP   Thank you, Robin Searing, NP

## 2023-05-06 ENCOUNTER — Other Ambulatory Visit: Payer: Self-pay | Admitting: Family Medicine

## 2023-05-06 DIAGNOSIS — Z1231 Encounter for screening mammogram for malignant neoplasm of breast: Secondary | ICD-10-CM

## 2023-08-20 ENCOUNTER — Encounter: Payer: Self-pay | Admitting: Cardiology

## 2023-09-07 ENCOUNTER — Other Ambulatory Visit: Payer: Self-pay

## 2023-09-07 MED ORDER — HYDROCHLOROTHIAZIDE 25 MG PO TABS: 12.5000 mg | ORAL_TABLET | Freq: Every day | ORAL | 1 refills | Status: DC

## 2023-09-25 ENCOUNTER — Ambulatory Visit
Admission: RE | Admit: 2023-09-25 | Discharge: 2023-09-25 | Disposition: A | Discharge status: Home / Self Care | Source: Ambulatory Visit | Attending: Family Medicine

## 2023-09-25 DIAGNOSIS — Z1231 Encounter for screening mammogram for malignant neoplasm of breast: Secondary | ICD-10-CM

## 2023-10-13 ENCOUNTER — Encounter (HOSPITAL_COMMUNITY): Payer: Self-pay

## 2023-10-13 ENCOUNTER — Other Ambulatory Visit: Payer: Self-pay

## 2023-10-13 ENCOUNTER — Emergency Department (HOSPITAL_COMMUNITY)
Admission: EM | Admit: 2023-10-13 | Discharge: 2023-10-14 | Disposition: A | Discharge status: Home / Self Care | Attending: Emergency Medicine | Admitting: Emergency Medicine

## 2023-10-13 DIAGNOSIS — Z7901 Long term (current) use of anticoagulants: Secondary | ICD-10-CM | POA: Insufficient documentation

## 2023-10-13 DIAGNOSIS — M545 Low back pain, unspecified: Secondary | ICD-10-CM | POA: Insufficient documentation

## 2023-10-13 MED ORDER — OXYCODONE HCL 5 MG PO TABS
5.0000 mg | ORAL_TABLET | Freq: Once | ORAL | Status: AC
  Administered 2023-10-13: 5 mg via ORAL
  Filled 2023-10-13: qty 1

## 2023-10-13 MED ORDER — ONDANSETRON 4 MG PO TBDP
4.0000 mg | ORAL_TABLET | Freq: Once | ORAL | Status: AC
  Administered 2023-10-13: 4 mg via ORAL
  Filled 2023-10-13: qty 1

## 2023-10-13 NOTE — ED Triage Notes (Signed)
 Patient c/o lower back pain x 3 weeks to a month steadily getting worse to the point tylenol  isn't helping. Patient also reports that this morning her left leg started going numb. Hx of sciatica, stroke and MI.

## 2023-10-13 NOTE — ED Provider Triage Note (Signed)
 Emergency Medicine Provider Triage Evaluation Note  Lindsey [name] , a 53 y.o. female  was evaluated in triage.  Pt complains of back pain for approximately 1 month, previously alleviated with Tylenol  however Tylenol  no longer helping.  Patient states that she also appreciates numbness going down her left leg.  Past medical history significant for sciatica with outpatient MRI previously scheduled however this was years ago and was never completed.  Patient states that it has been a long time since her sciatica has acted up.  Denies groin numbness or tingling, urinary/stool incontinence or retention.  Denies fever, chills, chest pain, shortness of breath.  Patient ambulatory with some discomfort.  Denies facial droop, slurred speech, unilateral weakness.  Patient does have history of MI as well as stroke on Xarelto  as well as Plavix  outpatient.  Patient states that she has taken Tylenol  for pain at home with minimal relief.  Denies trauma/injury.  Review of Systems  Positive: Back pain, left leg numbness Negative: Fever, Chills, chest pain, shortness of breath, groin numbness/tingling, urinary/stool incontinence or retention  Physical Exam  BP (!) 139/98   Pulse 71   Temp 98.5 F (36.9 C)   Resp 12   LMP 04/02/2015   SpO2 98%  Gen:   Awake, no distress   Resp:  Normal effort  MSK:   Moves extremities without difficulty  Other:  Patient not in acute distress, vital signs stable, patient able to raise bilateral lower extremities up off of bed against resistance, patient able to raise toes against resistance.  No worsening of back pain with palpation of lumbar spine or paraspinal muscles.  Medical Decision Making  Medically screening exam initiated at 1:44 PM.  Appropriate orders placed.  Tona LELON Pizza was informed that the remainder of the evaluation will be completed by another provider, this initial triage assessment does not replace that evaluation, and the importance of remaining in the ED  until their evaluation is complete.  Orders: Oxycodone , Zofran    Janetta Terrall FALCON, PA-C 10/13/23 1349

## 2023-10-13 NOTE — ED Triage Notes (Signed)
 Pt c/o low back pain and L leg numbness that started this am; hx sciatica; denies injury; ambulatory with steady gait at check in

## 2023-10-14 ENCOUNTER — Emergency Department (HOSPITAL_COMMUNITY)

## 2023-10-14 DIAGNOSIS — M545 Low back pain, unspecified: Secondary | ICD-10-CM | POA: Diagnosis not present

## 2023-10-14 MED ORDER — METHOCARBAMOL 500 MG PO TABS: 500.0000 mg | ORAL_TABLET | Freq: Two times a day (BID) | ORAL | 0 refills | Status: AC

## 2023-10-14 MED ORDER — METHYLPREDNISOLONE 4 MG PO TBPK: ORAL_TABLET | ORAL | 0 refills | Status: AC

## 2023-10-14 NOTE — ED Provider Notes (Signed)
  Physical Exam  BP 127/87 (BP Location: Left Arm)   Pulse (!) 53   Temp 98.2 F (36.8 C)   Resp 18   LMP 04/02/2015   SpO2 99%   Physical Exam  Procedures  Procedures  ED Course / MDM    Medical Decision Making Amount and/or Complexity of Data Reviewed Radiology: ordered.  Risk Prescription drug management.   Patient is here with complaint of low back pain for a month.  Has been evaluated for this with primary care.  She is ambulatory.  Stable and well-appearing.  No red flag symptoms associated with her back pain.  Plan is to treat pain and check an x-ray here then discharge.   Lumbar x-ray: Mild degenerative changes of L3, L4 and L4, L5.  Patient will be started on Medrol  Dosepak, muscle relaxer and follow-up with primary care.  Stable and well-appearing feel appropriate for discharge.       Shermon Warren SAILOR, PA-C 10/14/23 9252    Doretha Folks, MD 10/16/23 2240

## 2023-10-14 NOTE — Discharge Instructions (Addendum)
 X-rays today were normal, but did show degenerative changes at L3 and L4. Take prescribed medication as directed.  Can continue tylenol  with these if needed. Recommend that you follow-up with your primary care doctor if symptoms persist. Return here for new concerns.

## 2023-10-14 NOTE — ED Notes (Signed)
 Patient ambulated from lobby to hallway 20 and then to bathroom

## 2023-10-14 NOTE — ED Provider Notes (Signed)
 [facility] EM[facility]PARTMENT AT [facility] Provider Note   CSN: [id number] Arrival date & time: 10/13/23  1140     Patient presents with: Back Pain   Lindsey [name] is a 53 y.o. female.   The history is provided by the patient and medical records.  Back Pain  53 year old female presenting to the ED with back pain.  This has been ongoing for about a month now.  States pain is worse in the morning where she feels very stiff and can take her 15 to 20 minutes to get out of bed.  As she moves throughout the day this seems to improve.  States recently she felt like she had some tingling of her left upper leg, that seems improved during my evaluation and denies that sensation currently.  She has not had any bowel or bladder incontinence, no urinary retention, fever, or chills.  States she saw her PCP about this twice before and was supposed to have imaging done but somehow orders were messed up and never got completed.  States pain is normally controlled with Tylenol  but was not today.  She received a oxycodone  in triage and states she is feeling much better at this time.  She was able to ambulate from the waiting room back to treatment area and to the bathroom independently without issue.  Prior to Admission medications   Medication Sig Start Date End Date Taking? Authorizing Provider  atorvastatin  (LIPITOR ) 40 MG tablet Take 1 tablet (40 mg total) by mouth daily. 11/04/18   Black, Darice HERO, NP  clopidogrel  (PLAVIX ) 75 MG tablet Take 1 tablet (75 mg total) by mouth daily. 02/18/22   Tobb, Kardie, DO  diclofenac  Sodium (VOLTAREN ) 1 % GEL Apply 4 g topically 4 (four) times daily. 02/04/22   Floyd, Dan, DO  fluticasone (FLONASE) 50 MCG/ACT nasal spray Place 2 sprays into both nostrils daily. 08/12/22   [provider]  hydrochlorothiazide  (HYDRODIURIL ) 25 MG tablet Take 0.5 tablets (12.5 mg total) by mouth daily. 09/07/23   Tobb, Kardie, DO  isosorbide  mononitrate (IMDUR ) 60 MG 24  hr tablet Take 1 tablet (60 mg total) by mouth daily. 02/20/22   Burnard Debby LABOR, MD  nitroGLYCERIN  (NITROSTAT ) 0.4 MG SL tablet Place 1 tablet (0.4 mg total) under the tongue every 5 (five) minutes as needed for chest pain (up to 3 doses). 02/18/22   Tobb, Kardie, DO  pantoprazole  (PROTONIX ) 40 MG tablet Take 1 tablet (40 mg total) by mouth 2 (two) times daily. 11/04/18   Black, Darice HERO, NP  ranolazine  (RANEXA ) 500 MG 12 hr tablet TAKE 1 TABLET BY MOUTH TWICE A DAY 04/03/23   Tobb, Kardie, DO  rivaroxaban  (XARELTO ) 20 MG TABS tablet Take 1 tablet (20 mg total) by mouth daily. 11/04/18   Black, Darice HERO, NP  traZODone  (DESYREL ) 50 MG tablet Take 100 mg by mouth once.    [provider]    Allergies: Patient has no known allergies.    Review of Systems  Musculoskeletal:  Positive for back pain.  All other systems reviewed and are negative.   Updated Vital Signs BP 127/87 (BP Location: Left Arm)   Pulse (!) 53   Temp 98.2 F (36.8 C)   Resp 18   LMP 04/02/2015   SpO2 99%   Physical Exam Vitals and nursing note reviewed.  Constitutional:      Appearance: She is well-developed.  HENT:     Head: Normocephalic and atraumatic.  Eyes:  Conjunctiva/sclera: Conjunctivae normal.     Pupils: Pupils are equal, round, and reactive to light.  Cardiovascular:     Rate and Rhythm: Normal rate and regular rhythm.     Heart sounds: Normal heart sounds.  Pulmonary:     Effort: Pulmonary effort is normal.     Breath sounds: Normal breath sounds.  Abdominal:     General: Bowel sounds are normal.     Palpations: Abdomen is soft.  Musculoskeletal:        General: Normal range of motion.     Cervical back: Normal range of motion.     Comments: No deformities noted of the spine, negative SLR's bilaterally, normal sensation throughout both legs with testing, able to plantar and dorsiflex, ambulatory with steady gait independently   Skin:    General: Skin is warm and dry.  Neurological:      Mental Status: She is alert and oriented to person, place, and time.     (all labs ordered are listed, but only abnormal results are displayed) Labs Reviewed - No data to display  EKG: None  Radiology: No results found.   Procedures   Medications Ordered in the ED  oxyCODONE  (Oxy IR/ROXICODONE ) immediate release tablet 5 mg (5 mg Oral Given 10/13/23 1347)  ondansetron  (ZOFRAN -ODT) disintegrating tablet 4 mg (4 mg Oral Given 10/13/23 1347)                                    Medical Decision Making Amount and/or Complexity of Data Reviewed Radiology: ordered.  Risk Prescription drug management.   53 year old female presenting to the ED with back pain for 1 month.  Worse in the morning and improves throughout the day.  Reported some numbness that seems to have resolved now.  She has not had any incontinence or weakness of the legs, remains ambulatory here in the ED.  She is afebrile and nontoxic in appearance.  Her leg is normal in appearance without any visible swelling or erythema.  She has normal sensation throughout both legs on my exam.  No deformities noted to the spine.  She has negative straight leg raise bilaterally.  States she is feeling better now after some pain medication in triage.  Easily ambulating to the restroom independently.    She has not had any imaging up until this point, states there was some error with orders from her PCP to have imaging done.  Will obtain screening x-ray here. She has no red flag symptoms to warrant emergent MRI at this time.  6:32 AM X-ray not yet done at shift change.  Care will be signed out to oncoming provider.  Anticipate discharge with close PCP follow-up if no acute findings.    Final diagnoses:  Acute low back pain without sciatica, unspecified back pain laterality    ED Discharge Orders          Ordered    methocarbamol  (ROBAXIN ) 500 MG tablet  2 times daily        10/14/23 0631    methylPREDNISolone  (MEDROL  DOSEPAK)  4 MG TBPK tablet        10/14/23 0631               Jarold Olam HERO, PA-C 10/14/23 9356    Haze Lonni PARAS, MD 10/14/23 [phone]

## 2023-12-22 ENCOUNTER — Encounter: Payer: Self-pay | Admitting: Nurse Practitioner

## 2023-12-22 ENCOUNTER — Ambulatory Visit: Attending: Nurse Practitioner | Admitting: Nurse Practitioner

## 2023-12-22 VITALS — BP 118/76 | HR 62 | Resp 16 | Ht 66.0 in | Wt 201.4 lb

## 2023-12-22 DIAGNOSIS — E785 Hyperlipidemia, unspecified: Secondary | ICD-10-CM

## 2023-12-22 DIAGNOSIS — I10 Essential (primary) hypertension: Secondary | ICD-10-CM

## 2023-12-22 DIAGNOSIS — I25118 Atherosclerotic heart disease of native coronary artery with other forms of angina pectoris: Secondary | ICD-10-CM | POA: Diagnosis not present

## 2023-12-22 DIAGNOSIS — G459 Transient cerebral ischemic attack, unspecified: Secondary | ICD-10-CM

## 2023-12-22 DIAGNOSIS — F17200 Nicotine dependence, unspecified, uncomplicated: Secondary | ICD-10-CM

## 2023-12-22 MED ORDER — RANOLAZINE ER 1000 MG PO TB12: 1000.0000 mg | ORAL_TABLET | Freq: Two times a day (BID) | ORAL | 11 refills | Status: AC

## 2023-12-22 NOTE — Progress Notes (Signed)
 Office Visit    Patient Name: Lindsey [name] Date of Encounter: 12/22/2023  Primary Care Provider:  Haze Kingfisher, MD Primary Cardiologist:  Dub Huntsman, DO  Chief Complaint    53 year old female with a history of CAD s/p apical infarct in 2000, s/p NSTEMI in 2009 s/p BMS-LAD, CVA, suspected Antithrombin III  deficiency, LV apical thrombus, hypertension, hyperlipidemia, anemia, anxiety, GERD and tobacco use who presents for follow-up related to CAD.  Past Medical History    Past Medical History:  Diagnosis Date   Anemia    Antithrombin III  deficiency    ?pt not sure if true diagnosis   Anxiety    Apical mural thrombus    Blood transfusion    Cholecystitis 07/2016   Coronary artery disease    a. apical LAD infarction '00. b. NSTEMI s/p BMS to prox LAD '09. c. Cath 01/2015: stable LAD stent, otherwise minimal nonobstructive CAD.   GERD (gastroesophageal reflux disease)    Hyperlipidemia    Hypertension    Myocardial infarction [facility])    Noncompliance with medication regimen    a. h/o noncompliance with med regimen (previous running out of Coumadin ).   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   Stroke (HCC)    assoc with short term memory loss and right peripheral vision loss; age 39    TIA (transient ischemic attack) 2010   Tobacco abuse    Past Surgical History:  Procedure Laterality Date   CARDIAC CATHETERIZATION     CARDIAC CATHETERIZATION N/A 01/29/2015   Procedure: Left Heart Cath and Coronary Angiography;  Surgeon: Peter M Swaziland, MD;  Location: [facility] INVASIVE CV LAB;  Service: Cardiovascular;  Laterality: N/A;   CHOLECYSTECTOMY N/A 08/14/2016   Procedure: LAPAROSCOPIC CHOLECYSTECTOMY;  Surgeon: Tanda Locus, MD;  Location: [facility] OR;  Service: General;  Laterality: N/A;   COLONOSCOPY N/A 03/29/2014   Procedure: COLONOSCOPY;  Surgeon: Lamar JONETTA Aho, MD;  Location: [facility] ENDOSCOPY;  Service: Endoscopy;  Laterality: N/A;   CORONARY  ANGIOPLASTY     ESOPHAGOGASTRODUODENOSCOPY N/A 06/09/2012   Procedure: ESOPHAGOGASTRODUODENOSCOPY (EGD);  Surgeon: Belvie JONETTA Just, MD;  Location: [facility];  Service: Endoscopy;  Laterality: N/A;   ESOPHAGOGASTRODUODENOSCOPY N/A 08/02/2013   Procedure: ESOPHAGOGASTRODUODENOSCOPY (EGD);  Surgeon: Gwendlyn ONEIDA Buddy, MD;  Location: [facility] ENDOSCOPY;  Service: Endoscopy;  Laterality: N/A;   IR ANGIO INTRA EXTRACRAN SEL COM CAROTID INNOMINATE UNI R MOD SED  07/08/2017   LEFT HEART CATH AND CORONARY ANGIOGRAPHY N/A 02/20/2022   Procedure: LEFT HEART CATH AND CORONARY ANGIOGRAPHY;  Surgeon: Burnard Debby LABOR, MD;  Location: [facility] INVASIVE CV LAB;  Service: Cardiovascular;  Laterality: N/A;   LEFT HEART CATHETERIZATION WITH CORONARY ANGIOGRAM N/A 12/24/2011   Procedure: LEFT HEART CATHETERIZATION WITH CORONARY ANGIOGRAM;  Surgeon: Lonni JONETTA Cash, MD;  Location: [facility] CATH LAB;  Service: Cardiovascular;  Laterality: N/A;   RADIOLOGY WITH ANESTHESIA N/A 07/08/2017   Procedure: IR WITH ANESTHESIA;  Surgeon: Radiologist, Medication, MD;  Location: [facility] OR;  Service: Radiology;  Laterality: N/A;   TUBAL LIGATION      Allergies  No Known Allergies   Labs/Other Studies Reviewed    The following studies were reviewed today:  Cardiac Studies & Procedures   ______________________________________________________________________________________________ CARDIAC CATHETERIZATION  CARDIAC CATHETERIZATION 02/20/2022  Conclusion   Prox LAD to Mid LAD lesion is 100% stenosed.   The left ventricular ejection fraction is 50-55% by visual estimate.  Total occlusion of the proximal LAD at the proximal stent site.  Normal large ramus intermediate vessel; normal left circumflex coronary artery, and normal dominant RCA with extensive collateralization to the LAD to the RCA up to the distal portion of the stented segment.  There is mild mid inferior hypocontractility with suggestion of mild LVH and EF estimated at 50 to 55%.   LVEDP 18 mmHg.  RECOMMENDATION: Medical therapy with well collateralized LAD from the RCA. Continue Plavix  and resume Xarelto  commencing tomorrow.  Aggressive lipid-lowering therapy with target LDL less than 70.  Will titrate isosorbide  to 60 mg.  Continue additional antiischemic medical therapy.  Of note, the patient had been on combined antiplatelet and anticoagulation therapy and had run out of her Plavix  approximately 1 month ago and 2 weeks later most likely occluded her proximal LAD stent on Xarelto .  Findings Coronary Findings Diagnostic  Dominance: Right  Left Anterior Descending Prox LAD to Mid LAD lesion is 100% stenosed. The lesion was previously treated .  First Septal Branch Collaterals 1st Sept filled by collaterals from [facility].  Second Septal Branch Collaterals 2nd Sept filled by collaterals from Inf Sept.  Intervention  No interventions have been documented.   CARDIAC CATHETERIZATION  CARDIAC CATHETERIZATION 01/29/2015  Conclusion  Prox RCA lesion, 20% stenosed.  Mid LAD to Dist LAD lesion, 20% stenosed.  There is mild left ventricular systolic dysfunction.  1. Minimal nonobstructive CAD. Patent stent in the LAD 2. Mild LV dysfunction with chronic akinesis of the apex. 3. Complex filling defect in the LV apex consistent with thrombus.  Plan: resume full anticoagulation. Will assess with Echo.  Findings Coronary Findings Diagnostic  Dominance: Right  Left Main Vessel was injected. Vessel is normal in caliber. Vessel is angiographically normal.  Left Anterior Descending Previously placed Prox LAD stent (unknown type) is patent. Diffuse.  Ramus Intermedius . Vessel is large.  Right Coronary Artery  Intervention  No interventions have been documented.   STRESS TESTS  MYOCARDIAL PERFUSION IMAGING 09/15/2017  Interpretation Summary  The left ventricular ejection fraction is mildly decreased (45-54%).  Nuclear stress EF: 54%.  No T  wave inversion was noted during stress.  There was no ST segment deviation noted during stress.  Defect 1: There is a medium defect of severe severity.  Findings consistent with prior myocardial infarction.  This is a low risk study.  Medium size, severe intensity fixed apical perfusion defect, suggestive of scar. No reversible ischemia. LVEF 54% with apical dyskinesis. This is a low risk study.   ECHOCARDIOGRAM  ECHOCARDIOGRAM COMPLETE 07/09/2017  Narrative *[place]* *[facility]* [address] [place], [place] [address] [phone]  ------------------------------------------------------------------- Transthoracic Echocardiography  Patient:    Elias, Bordner MR #:       [id number] Study Date: 07/09/2017 Gender:     F Age:        46 Height:     167.6 cm Weight:     78.4 kg BSA:        1.93 m^2 Pt. Status: Room:       4N26C  PERFORMING   W. Jacques Somerset, MD [facility] TISA Merrianne Locus SONOGRAPHER  [address], RDCS ATTENDING    Ary Cummins, MD ADMITTING    Aroor, Claudio R  cc:  ------------------------------------------------------------------- LV EF: 55% -   60%  ------------------------------------------------------------------- Indications:      CVA 436.  ------------------------------------------------------------------- History:   PMH:  Mural Thrombus.  Coronary artery disease. Transient ischemic attack.  PMH:   Myocardial infarction.  Risk factors:  Hypertension. Dyslipidemia.  ------------------------------------------------------------------- Study Conclusions  - Left ventricle: The cavity size was normal. Wall thickness was increased in a pattern of mild LVH. Systolic function was normal. The estimated ejection fraction was in the range of 55% to 60%. Akinesis of the apical myocardium. Doppler parameters are consistent with abnormal left ventricular relaxation (grade 1 diastolic dysfunction). There was a  medium-sized, fixed, apicalthrombus. - Left atrium: The atrium was mildly dilated.  ------------------------------------------------------------------- Study data:  Comparison was made to the study of 03/26/2015.  Study status:  Routine.  Procedure:  The patient reported no pain pre or post test. Transthoracic echocardiography. Image quality was adequate.  Study completion:  There were no complications. Transthoracic echocardiography.  M-mode, complete 2D, spectral Doppler, and color Doppler.  Birthdate:  Patient birthdate: 29-Oct-1970.  Age:  Patient is 53 yr old.  Sex:  Gender: female. BMI: 27.9 kg/m^2.  Blood pressure:     133/86  Patient status: Inpatient.  Study date:  Study date: 07/09/2017. Study time: 09:49 AM.  Location:  ICU/CCU  -------------------------------------------------------------------  ------------------------------------------------------------------- Left ventricle:  The cavity size was normal. Wall thickness was increased in a pattern of mild LVH. Systolic function was normal. The estimated ejection fraction was in the range of 55% to 60%. There was a medium-sized, fixed, apicalthrombus.  Regional wall motion abnormalities:   Akinesis of the apical myocardium. Doppler parameters are consistent with abnormal left ventricular relaxation (grade 1 diastolic dysfunction).  ------------------------------------------------------------------- Aortic valve:   Trileaflet; normal thickness leaflets. Mobility was not restricted.  Doppler:  Transvalvular velocity was within the normal range. There was no stenosis. There was no regurgitation.  ------------------------------------------------------------------- Aorta:  Aortic root: The aortic root was normal in size.  ------------------------------------------------------------------- Mitral valve:   Structurally normal valve.   Mobility was not restricted.  Doppler:  Transvalvular velocity was within the normal range.  There was no evidence for stenosis. There was no regurgitation.    Peak gradient (D): 2 mm Hg.  ------------------------------------------------------------------- Left atrium:  The atrium was mildly dilated.  ------------------------------------------------------------------- Right ventricle:  The cavity size was normal. Wall thickness was normal. Systolic function was normal.  ------------------------------------------------------------------- Pulmonic valve:   Poorly visualized.  The valve appears to be grossly normal.    Doppler:  Transvalvular velocity was within the normal range. There was no evidence for stenosis.  ------------------------------------------------------------------- Tricuspid valve:   Structurally normal valve.    Doppler: Transvalvular velocity was within the normal range. There was no regurgitation.  ------------------------------------------------------------------- Right atrium:  The atrium was normal in size.  ------------------------------------------------------------------- Pericardium:  There was no pericardial effusion.  ------------------------------------------------------------------- Systemic veins: Inferior vena cava: The vessel was normal in size.  ------------------------------------------------------------------- Measurements  Left ventricle                            Value        Reference LV ID, ED, PLAX chordal                   47.7  mm     43 - 52 LV ID, ES, PLAX chordal                   31.8  mm     23 - 38 LV fx shortening, PLAX chordal            33    %      >=29 LV PW thickness, ED  8.18  mm     ---------- IVS/LV PW ratio, ED             (H)       1.53         <=1.3 Stroke volume, 2D                         54    ml     ---------- Stroke volume/bsa, 2D                     28    ml/m^2 ---------- LV e&', lateral                            8.92  cm/s   ---------- LV E/e&', lateral                           8.26         ---------- LV e&', medial                             7.07  cm/s   ---------- LV E/e&', medial                           10.42        ---------- LV e&', average                            8     cm/s   ---------- LV E/e&', average                          9.22         ----------  Ventricular septum                        Value        Reference IVS thickness, ED                         12.5  mm     ----------  LVOT                                      Value        Reference LVOT ID, S                                19    mm     ---------- LVOT area                                 2.84  cm^2   ---------- LVOT peak velocity, S                     94.6  cm/s   ---------- LVOT mean velocity, S                     50.3  cm/s   ---------- LVOT VTI, S  19.1  cm     ----------  Aorta                                     Value        Reference Aortic root ID, ED                        28    mm     ----------  Left atrium                               Value        Reference LA ID, A-P, ES                            35    mm     ---------- LA ID/bsa, A-P                            1.81  cm/m^2 <=2.2 LA volume, S                              48.5  ml     ---------- LA volume/bsa, S                          25.1  ml/m^2 ---------- LA volume, ES, 1-p A4C                    33.3  ml     ---------- LA volume/bsa, ES, 1-p A4C                17.3  ml/m^2 ---------- LA volume, ES, 1-p A2C                    62.6  ml     ---------- LA volume/bsa, ES, 1-p A2C                32.4  ml/m^2 ----------  Mitral valve                              Value        Reference Mitral E-wave peak velocity               73.7  cm/s   ---------- Mitral A-wave peak velocity               91.7  cm/s   ---------- Mitral deceleration time        (H)       310   ms     150 - 230 Mitral peak gradient, D                   2     mm Hg  ---------- Mitral E/A ratio, peak                    0.8           ----------  Right atrium  Value        Reference RA ID, S-I, ES, A4C                       43.5  mm     34 - 49 RA area, ES, A4C                          11.1  cm^2   8.3 - 19.5 RA volume, ES, A/L                        22.3  ml     ---------- RA volume/bsa, ES, A/L                    11.6  ml/m^2 ----------  Right ventricle                           Value        Reference RV ID, minor axis, ED, A4C base           39.6  mm     ---------- TAPSE                                     24    mm     ---------- RV s&', lateral, S                         12.2  cm/s   ----------  Legend: (L)  and  (H)  mark values outside specified reference range.  ------------------------------------------------------------------- Prepared and Electronically Authenticated by  MICAEL Jacques Somerset, MD [facility] 2019-04-25T17:50:56        CARDIAC MRI  MR CARDIAC MORPHOLOGY W WO CONTRAST 01/31/2015  Narrative CLINICAL DATA:  R/O LV apical thrombus  EXAM: CARDIAC MRI  TECHNIQUE: The patient was scanned on a 1.5 Tesla GE magnet. A dedicated cardiac coil was used. Functional imaging was done using Fiesta sequences. 2,3, and 4 chamber views were done to assess for RWMA's. Modified Simpson's rule using a short axis stack was used to calculate an ejection fraction on a dedicated work [employer]. The patient received 28 cc of Multihance . After 10 minutes inversion recovery sequences were used to assess for infiltration and scar tissue.  CONTRAST:  28 cc Multihance   FINDINGS: The atria were normal in size. RV normal in size and function. Mild MR. Normal aortic valve. Trivial pericardial effusion No ASD/VSD. The distal anterior wall, septum and entire apex were akinetic  The quantitative EF was 50% with normal mid and basal function. Mild LVH septal thickness 12 mm.  Delayed enhancement images with gadolinium showed full thickness scar involving the  distal anterior wall, septum and apex. Long TI imaging post contrast showed a small amount  Of mural apical thrombus  IMPRESSION: 1) Mild LVE/LVH EF 50% with distal anterior wall, septal and apical akinesis  2) Post Gadolinium images with full thickness scar involving above areas  3) Long TI inversion recovery sequences post contrast show a small amount of mural apical thrombus  4) Mild MR  5) Trivial pericardial effusion  Maude Emmer   Electronically Signed By: Maude Emmer M.D. On: 01/31/2015 16:49   ______________________________________________________________________________________________     Recent Labs: No results found for  requested labs within last 365 days.  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  Echocardiogram is reviewed.  Demonstrated focal thrombus, she was started on warfarin.  Cardiac monitor in 2015 showed sinus rhythm, sinus tachycardia, PVCs.  Cardiac catheterization in November 2016 revealed no obstructive coronary artery disease, mild LV dysfunction with akinesis of the apex, apical thrombus.  Cardiac MRI November 2016 showed EF 50%, apical akinesis, evidence of apical thrombus, mild mitral valve regurgitation.  Echocardiogram in 2018 showed EF 55 to 60%, apical akinesis, apical thrombus, G1 DD.  A CTA of the neck showed patent carotid and vertebral arteries.  Nuclear study in 2018 showed EF 52%, apical scar, no evidence of ischemia.  Most recent  cardiac catheterization in 2023 revealed total occlusion of the proximal LAD at the proximal stent site with collateralization up to the distal portion of the stented segment, mild mid inferior hypocontractility suggesting mild LVH, EF 50 to 55%, LVEDP 18 mmHg.  Ongoing medical therapy was advised.  She was last seen in the office on 02/18/2023 and was stable from a cardiac standpoint.  She did report intermittent chest discomfort which she described as a squeezing just prior to her second daily dose of Ranexa .  She was advised to take Ranexa  every 10 hours.  It was noted that she have persistent symptoms, Ranexa  could be increased to 1000 mg daily.  She was seen in the ED in 09/2023 in setting of back pain.  She was treated with steroids and muscle relaxers.  She presents today for follow-up. Since her last visit she has been stable overall from a cardiac standpoint.  She continues to note intermittent chest discomfort as her Ranexa  wears off. She describes her symptoms as a tightness with associated left arm numbness/tingling. Symptoms resolve within 20 minutes of taking her next dose of Ranexa . She denies any exertional symptoms, though she does note generalized fatigue.  She was walking regularly for exercise up until recently.  She stopped in the setting of foot pain (this occurred after she purchased new shoes). Other than her ongoing intermittent chest discomfort, she denies any additional concerns today.  Home Medications    Current Outpatient Medications  Medication Sig Dispense Refill   atorvastatin  (LIPITOR ) 40 MG tablet Take 1 tablet (40 mg total) by mouth daily. 30 tablet 0   clopidogrel  (PLAVIX ) 75 MG tablet Take 1 tablet (75 mg total) by mouth daily. 90 tablet 3   diclofenac  Sodium (VOLTAREN ) 1 % GEL Apply 4 g topically 4 (four) times daily. 100 g 0   fluticasone (FLONASE) 50 MCG/ACT nasal spray Place 2 sprays into both nostrils daily.     hydrochlorothiazide  (HYDRODIURIL ) 25 MG tablet  Take 0.5 tablets (12.5 mg total) by mouth daily. 45 tablet 1   isosorbide  mononitrate (IMDUR ) 60 MG 24 hr tablet Take 1 tablet (60 mg total) by mouth daily. 90 tablet 3   methocarbamol  (ROBAXIN ) 500 MG tablet Take 1 tablet (500 mg total)  by mouth 2 (two) times daily. 20 tablet 0   methylPREDNISolone  (MEDROL  DOSEPAK) 4 MG TBPK tablet Follow package directions 21 tablet 0   nitroGLYCERIN  (NITROSTAT ) 0.4 MG SL tablet Place 1 tablet (0.4 mg total) under the tongue every 5 (five) minutes as needed for chest pain (up to 3 doses). 25 tablet 3   pantoprazole  (PROTONIX ) 40 MG tablet Take 1 tablet (40 mg total) by mouth 2 (two) times daily. 60 tablet 0   ranolazine  (RANEXA ) 1000 MG SR tablet Take 1 tablet (1,000 mg total) by mouth 2 (two) times daily. 60 tablet 11   rivaroxaban  (XARELTO ) 20 MG TABS tablet Take 1 tablet (20 mg total) by mouth daily. 30 tablet 0   traZODone  (DESYREL ) 50 MG tablet Take 100 mg by mouth once.     No current facility-administered medications for this visit.     Review of Systems    She denies palpitations, dyspnea, pnd, orthopnea, n, v, dizziness, syncope, edema, weight gain, or early satiety. All other systems reviewed and are otherwise negative except as noted above.   Physical Exam    VS:  BP 118/76   Pulse 62   Resp 16   Ht 5' 6 (1.676 m)   Wt 201 lb 6.4 oz (91.4 kg)   LMP 04/02/2015   SpO2 93%   BMI 32.51 kg/m  GEN: Well nourished, well developed, in no acute distress. HEENT: normal. Neck: Supple, no JVD, carotid bruits, or masses. Cardiac: RRR, no murmurs, rubs, or gallops. No clubbing, cyanosis, edema.  Radials/DP/PT 2+ and equal bilaterally.  Respiratory:  Respirations regular and unlabored, clear to auscultation bilaterally. GI: Soft, nontender, nondistended, BS + x 4. MS: no deformity or atrophy. Skin: warm and dry, no rash. Neuro:  Strength and sensation are intact. Psych: Normal affect.  Accessory Clinical Findings    ECG personally reviewed by  me today - EKG Interpretation Date/Time:  Tuesday December 22 2023 09:24:59 EDT Ventricular Rate:  60 PR Interval:  160 QRS Duration:  72 QT Interval:  404 QTC Calculation: 404 R Axis:   89  Text Interpretation: Normal sinus rhythm Low voltage QRS Cannot rule out Anteroseptal infarct (cited on or before 18-Feb-2023) When compared with ECG of 18-Feb-2023 09:26, No significant change was found Confirmed by Daneen [name] ([id number]) on 12/22/2023 9:31:01 AM  - no acute changes.   Lab Results  Component Value Date   WBC 7.8 02/18/2022   HGB 12.4 02/18/2022   HCT 37.6 02/18/2022   MCV 84 02/18/2022   PLT 247 02/18/2022   Lab Results  Component Value Date   CREATININE 0.64 02/18/2022   BUN 10 02/18/2022   NA 143 02/18/2022   K 5.2 02/18/2022   CL 105 02/18/2022   CO2 25 02/18/2022   Lab Results  Component Value Date   ALT 25 12/13/2021   AST 32 12/13/2021   ALKPHOS 84 12/13/2021   BILITOT 0.6 12/13/2021   Lab Results  Component Value Date   CHOL 124 08/14/2022   HDL 54 08/14/2022   LDLCALC 55 08/14/2022   TRIG 73 08/14/2022   CHOLHDL 2.3 08/14/2022    Lab Results  Component Value Date   HGBA1C 5.6 02/18/2022    Assessment & Plan    1. CAD: CAD s/p apical infarct in 2000, s/p NSTEMI in 2009 s/p BMS-LAD. Most recent cardiac catheterization in 2023 revealed total occlusion of the proximal LAD at the proximal stent site with collateralization up to the distal portion of the stented  segment, mild mid inferior hypocontractility suggesting mild LVH, EF 50 to 55%, LVEDP 18 mmHg.  Ongoing medical therapy was advised.  Since her visit last year, she continues to report intermittent chest discomfort, which she describes as a tightness/squeezing that occurs at rest, just prior to her second dose of daily Ranexa .  She does feel that Ranexa  helps her symptoms.  Will increase Ranexa  to 1000 mg twice daily.  Continue to monitor symptoms.  Reviewed ED precautions.  Encouraged increased activity  as tolerated.  Continue Plavix , hydrochlorothiazide , Imdur , Ranexa  as above, Lipitor .  2. Suspected Antithrombin III  deficiency/history of LV apical thrombus/history of TIA: On Xarelto .  Denies bleeding.  Will update CBC, CMET.  3. Hypertension: BP mildly elevated in office today, improved with recheck.  Generally well controlled. Continue current antihypertensive regimen.   4. Hyperlipidemia: LDL was 55 in 07/2022.  Will update fasting lipids, CMET.  Continue Lipitor .   5. Disposition: Follow-up in 3 months, sooner if needed.      Damien JAYSON Braver, NP 12/22/2023, 10:04 AM

## 2023-12-22 NOTE — Patient Instructions (Signed)
 Medication Instructions:  Increase Ranexa  1000 mg twice daily  *If you need a refill on your cardiac medications before your next appointment, please call your pharmacy*  Lab Work: Fasting Lipid panel, CMET, CBC  Testing/Procedures: NONE ordered at this time of appointment   Follow-Up: At [facility], you and your health needs are our priority.  As part of our continuing mission to provide you with exceptional heart care, our providers are all part of one team.  This team includes your primary Cardiologist (physician) and Advanced Practice Providers or APPs (Physician Assistants and Nurse Practitioners) who all work together to provide you with the care you need, when you need it.  Your next appointment:   3 month(s)  Provider:   Kardie Tobb, DO    We recommend signing up for the patient portal called MyChart.  Sign up information is provided on this After Visit Summary.  MyChart is used to connect with patients for Virtual Visits (Telemedicine).  Patients are able to view lab/test results, encounter notes, upcoming appointments, etc.  Non-urgent messages can be sent to your provider as well.   To learn more about what you can do with MyChart, go to [email or web address].

## 2024-03-02 ENCOUNTER — Other Ambulatory Visit: Payer: Self-pay | Admitting: Cardiology

## 2024-03-03 MED ORDER — HYDROCHLOROTHIAZIDE 25 MG PO TABS: 12.5000 mg | ORAL_TABLET | Freq: Every day | ORAL | 3 refills | Status: AC

## 2024-03-09 ENCOUNTER — Other Ambulatory Visit: Payer: Self-pay | Admitting: Cardiology

## 2024-04-14 ENCOUNTER — Telehealth: Payer: Self-pay | Admitting: Cardiology

## 2024-04-14 NOTE — Telephone Encounter (Signed)
" °  Per MyChart scheduling message;  Pt c/o of Chest Pain: STAT if active (IN THIS MOMENT) CP, including tightness, pressure, jaw pain, shoulder/upper arm/back pain, SOB, nausea, and vomiting.  1. Are you having CP right now (tightness, pressure, or discomfort)?   2. Are you experiencing any other symptoms (ex. SOB, nausea, vomiting, sweating)?   3. How long have you been experiencing CP?   4. Is your CP continuous or coming and going?   5. Have you taken Nitroglycerin ?   6. If CP returns before callback, please consider calling 911. ?    No, Im not experiencing pain. Its just a dull ache that comes and goes.  "

## 2024-04-14 NOTE — Telephone Encounter (Signed)
 Called and spoke to pt. She has been having the same Chest Pains she was having prior to her increasing the Ranexa  to 1,000 mg BID. Describes these as Dull Aches. Denies any other associated symptoms. Denies SOB/DOE, dizziness, nausea, fatigue, palpitations.   Velton Braver, NP on 12/22/23 and since then, the CP's have been tolerable and predictable up until about two weeks ago. They are now more sporadic and on 04/13/24, while driving, she experienced a a weird feeling (could not give any further details) and thought she would need to pull over on the side of the road, but it eventually subsided.   Cardiac Medications reviewed and she is taking all except the Protonix . She recently started Buspar and this was added to her current list. She has NOT needed to take any SL NTG but does have it if needed. Has not checked BP's recently.   Pt was scheduled to see Dr. Sheena on 04/19/24 at 3:20 pm. ER precautions reviewed, pt verbalized understanding. Also advised pt to check her BP's in the  next few days and bring in her numbers, recent meds, and her BP cuff to appointment next week.

## 2024-04-18 ENCOUNTER — Encounter: Payer: Self-pay | Admitting: *Deleted

## 2024-04-19 ENCOUNTER — Encounter: Payer: Self-pay | Admitting: Cardiology

## 2024-04-19 ENCOUNTER — Ambulatory Visit: Admitting: Cardiology

## 2024-04-19 VITALS — BP 144/100 | HR 74 | Ht 66.0 in | Wt 204.6 lb

## 2024-04-19 DIAGNOSIS — I10 Essential (primary) hypertension: Secondary | ICD-10-CM

## 2024-04-19 MED ORDER — ISOSORBIDE MONONITRATE ER 120 MG PO TB24: 120.0000 mg | ORAL_TABLET | Freq: Every day | ORAL | 3 refills | Status: AC

## 2024-04-19 NOTE — Patient Instructions (Addendum)
 Medication Instructions:  Your physician has recommended you make the following change in your medication:  INCREASE: Imdur  120 mg once daily  *If you need a refill on your cardiac medications before your next appointment, please call your pharmacy* Please take your blood pressure daily for 2 weeks and send in a MyChart message. Please include heart rates. (One message at the end of the 2 weeks).   HOW TO TAKE YOUR BLOOD PRESSURE: Rest 5 minutes before taking your blood pressure. Dont smoke or drink caffeinated beverages for at least 30 minutes before. Take your blood pressure before (not after) you eat. Sit comfortably with your back supported and both feet on the floor (dont cross your legs). Elevate your arm to heart level on a table or a desk. Use the proper sized cuff. It should fit smoothly and snugly around your bare upper arm. There should be enough room to slip a fingertip under the cuff. The bottom edge of the cuff should be 1 inch above the crease of the elbow. Ideally, take 3 measurements at one sitting and record the average.  Blood Pressure Log Date/Time Medications taken? (Y/N) Blood Pressure Heart Rate/Pulse                                                                                                                                                                                          Follow-Up: At [facility], you and your health needs are our priority.  As part of our continuing mission to provide you with exceptional heart care, our providers are all part of one team.  This team includes your primary Cardiologist (physician) and Advanced Practice Providers or APPs (Physician Assistants and Nurse Practitioners) who all work together to provide you with the care you need, when you need it.  Your next appointment:   12 week(s)  Provider:   Kardie Tobb, DO    Other Instructions:

## 2024-04-21 ENCOUNTER — Emergency Department (HOSPITAL_COMMUNITY)
Admission: EM | Admit: 2024-04-21 | Discharge: 2024-04-22 | Disposition: A | Source: Home / Self Care | Attending: Emergency Medicine | Admitting: Emergency Medicine

## 2024-04-21 ENCOUNTER — Other Ambulatory Visit: Payer: Self-pay

## 2024-04-21 ENCOUNTER — Emergency Department (HOSPITAL_COMMUNITY)

## 2024-04-21 ENCOUNTER — Encounter (HOSPITAL_COMMUNITY): Payer: Self-pay | Admitting: Emergency Medicine

## 2024-04-21 DIAGNOSIS — I6521 Occlusion and stenosis of right carotid artery: Secondary | ICD-10-CM

## 2024-04-21 DIAGNOSIS — R42 Dizziness and giddiness: Secondary | ICD-10-CM

## 2024-04-21 DIAGNOSIS — R519 Headache, unspecified: Secondary | ICD-10-CM

## 2024-04-21 DIAGNOSIS — R079 Chest pain, unspecified: Secondary | ICD-10-CM

## 2024-04-21 LAB — CBC
HCT: 37.5 % (ref 36.0–46.0)
Hemoglobin: 11.8 g/dL — ABNORMAL LOW (ref 12.0–15.0)
MCH: 28 pg (ref 26.0–34.0)
MCHC: 31.5 g/dL (ref 30.0–36.0)
MCV: 89.1 fL (ref 80.0–100.0)
Platelets: 223 10*3/uL (ref 150–400)
RBC: 4.21 MIL/uL (ref 3.87–5.11)
RDW: 13.2 % (ref 11.5–15.5)
WBC: 6.2 10*3/uL (ref 4.0–10.5)
nRBC: 0 % (ref 0.0–0.2)

## 2024-04-21 LAB — TROPONIN T, HIGH SENSITIVITY
Troponin T High Sensitivity: 11 ng/L (ref 0–19)
Troponin T High Sensitivity: 11 ng/L (ref 0–19)

## 2024-04-21 LAB — BASIC METABOLIC PANEL WITH GFR
Anion gap: 13 (ref 5–15)
BUN: 20 mg/dL (ref 6–20)
CO2: 22 mmol/L (ref 22–32)
Calcium: 9.7 mg/dL (ref 8.9–10.3)
Chloride: 104 mmol/L (ref 98–111)
Creatinine, Ser: 1.12 mg/dL — ABNORMAL HIGH (ref 0.44–1.00)
GFR, Estimated: 58 mL/min — ABNORMAL LOW
Glucose, Bld: 96 mg/dL (ref 70–99)
Potassium: 4.5 mmol/L (ref 3.5–5.1)
Sodium: 139 mmol/L (ref 135–145)

## 2024-04-21 NOTE — ED Provider Triage Note (Addendum)
 Emergency Medicine Provider Triage Evaluation Note  Lindsey [name] , a 54 y.o. female  was evaluated in triage.  Pt complains of chest pain, dull pressure, mid sternal for the past few days, now radiating under left breast area. Went to cardiology 04/19/24, told to monitor her BP which has been elevated for the past few days. Hx of MI with stents, CVA.  BP 152/106 on recheck Review of Systems  Positive:  Negative:   Physical Exam  BP (!) 173/111 (BP Location: Right Arm)   Pulse 87   Temp 98.1 F (36.7 C)   Resp 16   Wt 93 kg   LMP 04/02/2015   SpO2 99%   BMI 33.09 kg/m  Gen:   Awake, no distress   Resp:  Normal effort  MSK:   Moves extremities without difficulty  Other:    Medical Decision Making  Medically screening exam initiated at 8:06 PM.  Appropriate orders placed.  Tona LELON Pizza was informed that the remainder of the evaluation will be completed by another provider, this initial triage assessment does not replace that evaluation, and the importance of remaining in the ED until their evaluation is complete.     Beverley Leita LABOR, PA-C 04/21/24 2013    Beverley Leita LABOR, PA-C 04/21/24 2054

## 2024-04-21 NOTE — ED Triage Notes (Signed)
 She is having lightheadedness, dizziness and chest pain that is getting progressively worse over the past few days.  She says she has been having shob, and all over weakness.   Patient states she has had heart attacks and strokes in the past.

## 2024-04-21 NOTE — ED Triage Notes (Addendum)
 Patient c/o dull ache in chest since 2/1, has a history of MI and stroke.  Patient concerned about BP.  Patient also endorses sob and dizziness that started yesterday. Patient also endorses headache. Patient states that she felt like her speech was slurred an hour ago, has resolved.  Patient has previous left side deficits.

## 2024-04-22 ENCOUNTER — Emergency Department (HOSPITAL_COMMUNITY)

## 2024-04-22 LAB — HEPATIC FUNCTION PANEL
ALT: 13 U/L (ref 0–44)
AST: 22 U/L (ref 15–41)
Albumin: 4 g/dL (ref 3.5–5.0)
Alkaline Phosphatase: 86 U/L (ref 38–126)
Bilirubin, Direct: 0.2 mg/dL (ref 0.0–0.2)
Indirect Bilirubin: 0.5 mg/dL (ref 0.3–0.9)
Total Bilirubin: 0.7 mg/dL (ref 0.0–1.2)
Total Protein: 7.3 g/dL (ref 6.5–8.1)

## 2024-04-22 LAB — LIPASE, BLOOD: Lipase: 13 U/L (ref 11–51)

## 2024-04-22 LAB — PRO BRAIN NATRIURETIC PEPTIDE: Pro Brain Natriuretic Peptide: 54.7 pg/mL

## 2024-04-22 MED ORDER — IOHEXOL 350 MG/ML SOLN: 75.0000 mL | Freq: Once | INTRAVENOUS | Status: DC | PRN

## 2024-04-22 MED ORDER — PROCHLORPERAZINE EDISYLATE 10 MG/2ML IJ SOLN
10.0000 mg | Freq: Once | INTRAMUSCULAR | Status: AC
  Administered 2024-04-22: 10 mg via INTRAVENOUS
  Filled 2024-04-22: qty 2

## 2024-04-22 MED ORDER — DIPHENHYDRAMINE HCL 50 MG/ML IJ SOLN
25.0000 mg | Freq: Once | INTRAMUSCULAR | Status: AC
  Administered 2024-04-22: 25 mg via INTRAVENOUS
  Filled 2024-04-22: qty 1

## 2024-04-22 MED ORDER — DEXAMETHASONE SOD PHOSPHATE PF 10 MG/ML IJ SOLN
10.0000 mg | Freq: Once | INTRAMUSCULAR | Status: AC
  Administered 2024-04-22: 10 mg via INTRAVENOUS
  Filled 2024-04-22: qty 1

## 2024-04-22 MED ORDER — IOHEXOL 350 MG/ML SOLN
75.0000 mL | Freq: Once | INTRAVENOUS | Status: AC | PRN
  Administered 2024-04-22: 75 mL via INTRAVENOUS

## 2024-04-22 NOTE — ED Provider Notes (Signed)
 " [name] [facility] Provider Note   CSN: [id number] Arrival date & time: 04/21/24  1949     Patient presents with: Chest Pain and Dizziness   Lindsey [name] is a 54 y.o. female.   54 year old female with a history of coronary artery disease status post stent, stroke, hypertension, diabetes on Xarelto  and Plavix .  She comes in mostly for a week worth of headache.  She states the first day that she had a she take Tylenol  and got better so she did not think much of it.  However came back again the next day and has been persistent since then and not improved with Tylenol .  She had some dizziness with it and she felt like she might be having another stroke so she came here for further evaluation.  She also has a little bit of chest pressure but she is not sure where that is coming from and not the main issue right now.  She states it does not feel your previous heart attack.  Her biggest issue is the headache and dizziness.  Limited nausea with the but no vomiting.  No other new neurologic changes but she does have some residual facial weakness   Chest Pain Associated symptoms: dizziness   Dizziness Associated symptoms: chest pain        Prior to Admission medications  Medication Sig Start Date End Date Taking? Authorizing Provider  atorvastatin  (LIPITOR ) 40 MG tablet Take 1 tablet (40 mg total) by mouth daily. 11/04/18   Black, Darice HERO, NP  busPIRone (BUSPAR) 15 MG tablet Take 15 mg by mouth 2 (two) times daily.    [provider]  clopidogrel  (PLAVIX ) 75 MG tablet Take 1 tablet (75 mg total) by mouth daily. 02/18/22   Tobb, Kardie, DO  hydrochlorothiazide  (HYDRODIURIL ) 25 MG tablet Take 0.5 tablets (12.5 mg total) by mouth daily. 03/03/24   Tobb, Kardie, DO  isosorbide  mononitrate (IMDUR ) 120 MG 24 hr tablet Take 1 tablet (120 mg total) by mouth daily. 04/19/24   Tobb, Kardie, DO  methocarbamol  (ROBAXIN ) 500 MG tablet Take 1 tablet (500 mg total)  by mouth 2 (two) times daily. 10/14/23   Jarold Olam HERO, PA-C  methylPREDNISolone  (MEDROL  DOSEPAK) 4 MG TBPK tablet Follow package directions 10/14/23   Jarold Olam HERO, PA-C  nitroGLYCERIN  (NITROSTAT ) 0.4 MG SL tablet Place 1 tablet (0.4 mg total) under the tongue every 5 (five) minutes as needed for chest pain (up to 3 doses). 02/18/22   Tobb, Kardie, DO  ranolazine  (RANEXA ) 1000 MG SR tablet Take 1 tablet (1,000 mg total) by mouth 2 (two) times daily. 12/22/23   Daneen Damien BROCKS, NP  rivaroxaban  (XARELTO ) 20 MG TABS tablet Take 1 tablet (20 mg total) by mouth daily. 11/04/18   Black, Darice HERO, NP  traZODone  (DESYREL ) 50 MG tablet Take 100 mg by mouth once.    [provider]    Allergies: Patient has no known allergies.    Review of Systems  Cardiovascular:  Positive for chest pain.  Neurological:  Positive for dizziness.    Updated Vital Signs BP (!) 122/90   Pulse 65   Temp 98.2 F (36.8 C) (Oral)   Resp 19   Wt 93 kg   LMP 04/02/2015   SpO2 96%   BMI 33.09 kg/m   Physical Exam Vitals and nursing note reviewed.  Constitutional:      Appearance: She is well-developed.  HENT:     Head: Normocephalic  and atraumatic.  Cardiovascular:     Rate and Rhythm: Normal rate and regular rhythm.  Pulmonary:     Effort: No respiratory distress.     Breath sounds: No stridor.  Abdominal:     General: There is no distension.  Musculoskeletal:     Cervical back: Normal range of motion.  Neurological:     Mental Status: She is alert. Mental status is at baseline.     Comments: Able to move extremities symmetrically.  Face is slightly asymmetric.  Tongue protrudes midline.  Vision, EOM intact     (all labs ordered are listed, but only abnormal results are displayed) Labs Reviewed  BASIC METABOLIC PANEL WITH GFR - Abnormal; Notable for the following components:      Result Value   Creatinine, Ser 1.12 (*)    GFR, Estimated 58 (*)    All other components within normal limits   CBC - Abnormal; Notable for the following components:   Hemoglobin 11.8 (*)    All other components within normal limits  HEPATIC FUNCTION PANEL  LIPASE, BLOOD  PRO BRAIN NATRIURETIC PEPTIDE  TROPONIN T, HIGH SENSITIVITY  TROPONIN T, HIGH SENSITIVITY    EKG: EKG Interpretation Date/Time:  Thursday April 21 2024 20:03:08 EST Ventricular Rate:  76 PR Interval:  154 QRS Duration:  74 QT Interval:  396 QTC Calculation: 445 R Axis:   92  Text Interpretation: Normal sinus rhythm Rightward axis Low voltage QRS Septal infarct , age undetermined Abnormal ECG When compared with ECG of 19-Apr-2024 15:38, PREVIOUS ECG IS PRESENT Confirmed by Lorette Mayo [phone]) on 04/22/2024 4:17:04 AM  Radiology: CT ANGIO HEAD NECK W WO CM Result Date: 04/22/2024 EXAM: CTA Head and Neck with Intravenous Contrast. CT Head without Contrast. CLINICAL HISTORY: 54 year old female with acute neurologic deficit, stroke suspected, aggressive dizziness, chest pain, weakness, hypertension, and history of right Middle Cerebral Artery (MCA) infarct in 2019. TECHNIQUE: Axial CTA images of the head and neck performed with and without intravenous contrast. MIP reconstructed images were created and reviewed. Axial computed tomography images of the head/brain performed without intravenous contrast. Note: Per PQRS, the description of internal carotid artery percent stenosis, including 0 percent or normal exam, is based on [employer] American Symptomatic Carotid Endarterectomy Trial (NASCET) criteria. Dose reduction technique was used including one or more of the following: automated exposure control, adjustment of mA and kV according to patient size, and/or iterative reconstruction. CONTRAST: Without and with intravenous contrast. 75 mL (iohexol  (OMNIPAQUE ) 350 MG/ML injection 75 mL IOHEXOL  350 MG/ML SOLN). COMPARISON: Brain MRI 07/09/2017, Head CT 12/14/2021, CTA Head and Neck 07/08/2017. FINDINGS: CT HEAD: BRAIN: No acute  intraparenchymal hemorrhage. No mass lesion. No CT evidence for acute territorial infarct. No midline shift or extra-axial collection. Stable right Middle Cerebral Artery (MCA) middle/anterior division chronic encephalomalacia. Stable brain volume. Smaller chronic infarct with encephalomalacia in the left occipital pole is unchanged from previous MRI. Stable gray Alfredo differentiation. VENTRICLES: No hydrocephalus. Minor ex vacuo enlargement of the right lateral ventricle. ORBITS: Mildly disconjugate gaze, nonspecific. SINUSES AND MASTOIDS: The paranasal sinuses, tympanic cavities, and mastoid air cells remain well aerated. CTA NECK: Normal 3 vessel aortic arch. COMMON CAROTID ARTERIES: No significant stenosis. No dissection or occlusion. INTERNAL CAROTID ARTERIES: Minimal atherosclerosis of the right Internal Carotid Artery (ICA) bulb. Mild right ICA tortuosity below the skull base. No right carotid stenosis. No left carotid atherosclerosis or stenosis. No dissection or occlusion. VERTEBRAL ARTERIES: Right vertebral artery mildly dominant as before. No vertebral artery  atherosclerosis or stenosis and normal Posterior Inferior Cerebellar Artery (PICA) origins. No significant stenosis. No dissection or occlusion. CTA HEAD: Diminutive or absent anterior and posterior communicating arteries. ANTERIOR CEREBRAL ARTERIES: Normal Anterior Cerebral Artery (ACA) origins. No significant stenosis. No occlusion. No aneurysm. MIDDLE CEREBRAL ARTERIES: Normal Middle Cerebral Artery (MCA) origins. No significant stenosis. No occlusion. No aneurysm. POSTERIOR CEREBRAL ARTERIES: Normal Posterior Cerebral Artery (PCA) origins. No significant stenosis. No occlusion. No aneurysm. BASILAR ARTERY: Normal Superior Cerebellar Artery (SCA) origins. No significant stenosis. No occlusion. No aneurysm. OTHER: Major dural venous sinuses are enhancing and appear patent. SOFT TISSUES: Nonvascular neck soft tissue spaces negative. BONES: Scattered  chronic dental caries. Chronic lower cervical spine disc and endplate degeneration. No acute osseous abnormality. LUNGS: Negative visible upper chest. IMPRESSION: 1. No acute intracranial abnormality. Chronic right MCA and small left PCA territory infarcts. 2. Minimal atherosclerosis and mild arterial tortuosity , otherwise negative CTA head and neck. Electronically signed by: Helayne Hurst MD 04/22/2024 05:42 AM EST RP Workstation: [id number]   DG Chest 2 View Result Date: 04/21/2024 EXAM: 2 VIEW(S) XRAY OF THE CHEST 04/21/2024 08:49:00 PM COMPARISON: Chest x-ray dated 02/04/2022. CLINICAL HISTORY: Chest pain. FINDINGS: LUNGS AND PLEURA: No focal pulmonary opacity. No pleural effusion. No pneumothorax. HEART AND MEDIASTINUM: No acute abnormality of the cardiac and mediastinal silhouettes. BONES AND SOFT TISSUES: No acute osseous abnormality. IMPRESSION: 1. No acute cardiopulmonary abnormality. Electronically signed by: Greig Pique MD 04/21/2024 08:52 PM EST RP Workstation: [id number]     Procedures   Medications Ordered in the ED  iohexol  (OMNIPAQUE ) 350 MG/ML injection 75 mL (has no administration in time range)  prochlorperazine  (COMPAZINE ) injection 10 mg (10 mg Intravenous Given 04/22/24 0448)  diphenhydrAMINE  (BENADRYL ) injection 25 mg (25 mg Intravenous Given 04/22/24 0448)  dexamethasone  (DECADRON ) injection 10 mg (10 mg Intravenous Given 04/22/24 0447)  iohexol  (OMNIPAQUE ) 350 MG/ML injection 75 mL (75 mLs Intravenous Contrast Given 04/22/24 0522)                                    Medical Decision Making Amount and/or Complexity of Data Reviewed Labs: ordered. Radiology: ordered.  Risk Prescription drug management.   At her baseline neurologically.  Headache improved with meds here.  CTAs negative for any acute changes but does have mild right ICA tortuosity and atherosclerosis.  She already has a neurologist she will follow-up with.  Will return here for any new or worsening symptoms.   No evidence of ACS with normal EKG and negative troponins.  Overall patient appears well feels better stable for discharge at this time.   Final diagnoses:  Dizziness  Nonintractable headache, unspecified chronicity pattern, unspecified headache type  Nonspecific chest pain  Atherosclerosis of right carotid artery    ED Discharge Orders     None          Lisa-Marie Rueger, Selinda, MD 04/22/24 [phone]  "

## 2024-07-14 ENCOUNTER — Ambulatory Visit: Admitting: Cardiology
